# Patient Record
Sex: Male | Born: 1965
Health system: Southern US, Community
[De-identification: ages and names within clinical notes are randomized; demographics above are authoritative.]

## PROBLEM LIST (undated history)

## (undated) DIAGNOSIS — F1410 Cocaine abuse, uncomplicated: Secondary | ICD-10-CM

## (undated) DIAGNOSIS — L02619 Cutaneous abscess of unspecified foot: Secondary | ICD-10-CM

## (undated) DIAGNOSIS — E669 Obesity, unspecified: Secondary | ICD-10-CM

## (undated) DIAGNOSIS — N1831 Chronic kidney disease, stage 3a: Secondary | ICD-10-CM

## (undated) DIAGNOSIS — E119 Type 2 diabetes mellitus without complications: Secondary | ICD-10-CM

## (undated) DIAGNOSIS — M869 Osteomyelitis, unspecified: Secondary | ICD-10-CM

## (undated) DIAGNOSIS — S98111A Complete traumatic amputation of right great toe, initial encounter: Secondary | ICD-10-CM

## (undated) DIAGNOSIS — I10 Essential (primary) hypertension: Secondary | ICD-10-CM

## (undated) DIAGNOSIS — E785 Hyperlipidemia, unspecified: Secondary | ICD-10-CM

## (undated) DIAGNOSIS — A159 Respiratory tuberculosis unspecified: Secondary | ICD-10-CM

## (undated) DIAGNOSIS — B9562 Methicillin resistant Staphylococcus aureus infection as the cause of diseases classified elsewhere: Secondary | ICD-10-CM

## (undated) DIAGNOSIS — R7881 Bacteremia: Secondary | ICD-10-CM

## (undated) DIAGNOSIS — D649 Anemia, unspecified: Secondary | ICD-10-CM

## (undated) DIAGNOSIS — Z720 Tobacco use: Secondary | ICD-10-CM

## (undated) HISTORY — PX: FOOT SURGERY: SHX648

## (undated) HISTORY — DX: Respiratory tuberculosis unspecified: A15.9

## (undated) HISTORY — PX: TONSILLECTOMY: SUR1361

---

## 2003-07-20 ENCOUNTER — Emergency Department (HOSPITAL_COMMUNITY): Admission: EM | Admit: 2003-07-20 | Discharge: 2003-07-20 | Payer: Self-pay | Admitting: Emergency Medicine

## 2008-06-24 ENCOUNTER — Emergency Department (HOSPITAL_COMMUNITY): Admission: EM | Admit: 2008-06-24 | Discharge: 2008-06-24 | Payer: Self-pay | Admitting: Emergency Medicine

## 2008-07-29 ENCOUNTER — Emergency Department (HOSPITAL_COMMUNITY): Admission: EM | Admit: 2008-07-29 | Discharge: 2008-07-29 | Payer: Self-pay | Admitting: Family Medicine

## 2008-08-11 ENCOUNTER — Ambulatory Visit: Payer: Self-pay | Admitting: *Deleted

## 2009-08-31 ENCOUNTER — Emergency Department: Payer: Self-pay | Admitting: Emergency Medicine

## 2010-11-10 ENCOUNTER — Emergency Department (HOSPITAL_COMMUNITY)
Admission: EM | Admit: 2010-11-10 | Discharge: 2010-11-10 | Payer: Self-pay | Source: Home / Self Care | Admitting: Emergency Medicine

## 2011-02-14 LAB — COMPREHENSIVE METABOLIC PANEL
CO2: 24 mEq/L (ref 19–32)
Calcium: 8.9 mg/dL (ref 8.4–10.5)
Chloride: 96 mEq/L (ref 96–112)
Creatinine, Ser: 1.06 mg/dL (ref 0.4–1.5)
GFR calc non Af Amer: >60 mL/min (ref >60)
Glucose, Bld: 400 mg/dL — ABNORMAL HIGH (ref 70–99)
Total Bilirubin: 1.2 mg/dL (ref 0.3–1.2)

## 2011-02-14 LAB — URINALYSIS, ROUTINE W REFLEX MICROSCOPIC
Glucose, UA: >1000 mg/dL — AB
Ketones, ur: >80 mg/dL — AB
Leukocytes, UA: NEGATIVE
Nitrite: NEGATIVE
Protein, ur: NEGATIVE mg/dL
pH: 5 (ref 5.0–8.0)

## 2011-02-14 LAB — CBC
HCT: 42.3 % (ref 39.0–52.0)
Hemoglobin: 13.8 g/dL (ref 13.0–17.0)
MCH: 26.7 pg (ref 26.0–34.0)
MCHC: 32.6 g/dL (ref 30.0–36.0)
MCV: 81.8 fL (ref 78.0–100.0)

## 2011-02-14 LAB — URINE MICROSCOPIC-ADD ON

## 2011-02-14 LAB — DIFFERENTIAL
Basophils Absolute: 0 10*3/uL (ref 0.0–0.1)
Eosinophils Absolute: 0.1 10*3/uL (ref 0.0–0.7)
Lymphocytes Relative: 24 % (ref 12–46)
Lymphs Abs: 2.3 10*3/uL (ref 0.7–4.0)
Neutrophils Relative %: 69 % (ref 43–77)

## 2011-09-01 LAB — URINALYSIS, ROUTINE W REFLEX MICROSCOPIC
Bilirubin Urine: NEGATIVE
Glucose, UA: >1000 — AB
Hgb urine dipstick: NEGATIVE
Ketones, ur: 15 — AB
Leukocytes, UA: NEGATIVE
Nitrite: NEGATIVE
Protein, ur: NEGATIVE
Specific Gravity, Urine: >1.046 — ABNORMAL HIGH
Urobilinogen, UA: 0.2
pH: 5

## 2011-09-01 LAB — HEPATIC FUNCTION PANEL
Albumin: 3.8
Alkaline Phosphatase: 62
Indirect Bilirubin: 0.5
Total Protein: 6.9

## 2011-09-01 LAB — CBC
HCT: 46
Hemoglobin: 14.9
MCHC: 32.5
MCV: 83.1
Platelets: 277
RBC: 5.53
RDW: 13.4
WBC: 8

## 2011-09-01 LAB — DIFFERENTIAL
Basophils Absolute: 0
Basophils Relative: 0
Eosinophils Absolute: 0.1
Eosinophils Relative: 1
Lymphocytes Relative: 22
Lymphs Abs: 1.8
Monocytes Absolute: 0.4
Monocytes Relative: 5
Neutro Abs: 5.8
Neutrophils Relative %: 72

## 2011-09-01 LAB — POCT I-STAT, CHEM 8
BUN: 12
Calcium, Ion: 1.03 — ABNORMAL LOW
Chloride: 104
HCT: 50
Potassium: 4.4
Sodium: 135

## 2011-09-01 LAB — URINE MICROSCOPIC-ADD ON

## 2011-09-01 LAB — LIPASE, BLOOD: Lipase: 25

## 2011-09-01 LAB — KETONES, QUALITATIVE: Acetone, Bld: NEGATIVE

## 2014-06-01 ENCOUNTER — Emergency Department (HOSPITAL_COMMUNITY)
Admission: EM | Admit: 2014-06-01 | Discharge: 2014-06-01 | Disposition: A | Discharge status: Home / Self Care | Payer: No Typology Code available for payment source | Attending: Emergency Medicine | Admitting: Emergency Medicine

## 2014-06-01 ENCOUNTER — Encounter (HOSPITAL_COMMUNITY): Payer: Self-pay | Admitting: Emergency Medicine

## 2014-06-01 DIAGNOSIS — Z792 Long term (current) use of antibiotics: Secondary | ICD-10-CM | POA: Insufficient documentation

## 2014-06-01 DIAGNOSIS — Y9389 Activity, other specified: Secondary | ICD-10-CM | POA: Insufficient documentation

## 2014-06-01 DIAGNOSIS — F17200 Nicotine dependence, unspecified, uncomplicated: Secondary | ICD-10-CM | POA: Insufficient documentation

## 2014-06-01 DIAGNOSIS — L03012 Cellulitis of left finger: Secondary | ICD-10-CM

## 2014-06-01 DIAGNOSIS — Y9289 Other specified places as the place of occurrence of the external cause: Secondary | ICD-10-CM | POA: Insufficient documentation

## 2014-06-01 DIAGNOSIS — W298XXA Contact with other powered powered hand tools and household machinery, initial encounter: Secondary | ICD-10-CM | POA: Insufficient documentation

## 2014-06-01 DIAGNOSIS — S61211A Laceration without foreign body of left index finger without damage to nail, initial encounter: Secondary | ICD-10-CM

## 2014-06-01 DIAGNOSIS — S61209A Unspecified open wound of unspecified finger without damage to nail, initial encounter: Secondary | ICD-10-CM | POA: Insufficient documentation

## 2014-06-01 DIAGNOSIS — IMO0002 Reserved for concepts with insufficient information to code with codable children: Secondary | ICD-10-CM | POA: Insufficient documentation

## 2014-06-01 DIAGNOSIS — Y990 Civilian activity done for income or pay: Secondary | ICD-10-CM | POA: Insufficient documentation

## 2014-06-01 MED ORDER — CEPHALEXIN 500 MG PO CAPS: 500.0000 mg | ORAL_CAPSULE | Freq: Four times a day (QID) | ORAL | Status: DC

## 2014-06-01 MED ORDER — TETANUS-DIPHTH-ACELL PERTUSSIS 5-2.5-18.5 LF-MCG/0.5 IM SUSP
0.5000 mL | Freq: Once | INTRAMUSCULAR | Status: AC
  Administered 2014-06-01: 0.5 mL via INTRAMUSCULAR
  Filled 2014-06-01: qty 0.5

## 2014-06-01 MED ORDER — OXYCODONE-ACETAMINOPHEN 5-325 MG PO TABS
1.0000 | ORAL_TABLET | Freq: Once | ORAL | Status: AC
Start: 2014-06-01 — End: 2014-06-01
  Administered 2014-06-01: 1 via ORAL
  Filled 2014-06-01: qty 1

## 2014-06-01 MED ORDER — HYDROCODONE-ACETAMINOPHEN 5-325 MG PO TABS: 1.0000 | ORAL_TABLET | ORAL | Status: DC | PRN

## 2014-06-01 NOTE — ED Provider Notes (Signed)
Medical screening examination/treatment/procedure(s) were performed by non-physician practitioner and as supervising physician I was immediately available for consultation/collaboration.   EKG Interpretation None        Kristen N Ward, DO 06/01/14 2319 

## 2014-06-01 NOTE — ED Notes (Signed)
Hannah, PA at the bedside.  

## 2014-06-01 NOTE — ED Notes (Signed)
Reported pain 8/10 to [place], [place]. She gives verbal order for Percocet.

## 2014-06-01 NOTE — ED Provider Notes (Signed)
CSN: [id number]     Arrival date & time 06/01/14  1608 History  This chart was scribed for non-physician practitioner Dalia HeadingHannah Muthersbough, PA-C working with Layla MawKristen N Ward, DO by Joaquin MusicKristina Sanchez-Matthews, ED Scribe. This patient was seen in room TR09C/TR09C and the patient's care was started at 7:29 PM .   Chief Complaint  Patient presents with  . Laceration   The history is provided by the patient. No language interpreter was used.   HPI Comments: [name] is a 48 y.o. male who presents to the Emergency Department complaining of laceration to L pointer finger that occurred this afternoon while at work. 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 States he cleaned and wrapped the area but reports having wound open up while at work. Last Tetanus: 5+ years ago. Denies having medical problems and taking medications on a regular basis.  History reviewed. No pertinent past medical history. History reviewed. No pertinent past surgical history. History reviewed. No pertinent family history. History  Substance Use Topics  . Smoking status: Current Every Day Smoker  . Smokeless tobacco: Not on file  . Alcohol Use: Not on file    Review of Systems  Constitutional: Negative for fever.  Gastrointestinal: Negative for nausea and vomiting.  Skin: Positive for wound.  Allergic/Immunologic: Negative for immunocompromised state.  Neurological: Negative for weakness and numbness.  Hematological: Does not bruise/bleed easily.  Psychiatric/Behavioral: The patient is not nervous/anxious.     Allergies  Review of patient's allergies indicates no known allergies.  Home Medications   Prior to Admission medications   Medication Sig Start Date End Date Taking? Authorizing Provider  cephALEXin (KEFLEX) 500 MG capsule Take 1 capsule (500 mg total) by mouth 4 (four) times daily. 06/01/14   Hannah Muthersbaugh, PA-C  HYDROcodone-acetaminophen (NORCO/VICODIN)  5-325 MG per tablet Take 1 tablet by mouth every 4 (four) hours as needed for moderate pain or severe pain. 06/01/14   Hannah Muthersbaugh, PA-C   BP 126/80  Pulse 70  Temp(Src) 98.7 F (37.1 C) (Oral)  Resp 18  SpO2 94%  Physical Exam  Nursing note and vitals reviewed. Constitutional: He is oriented to person, place, and time. He appears well-developed and well-nourished. No distress.  HENT:  Head: Normocephalic and atraumatic.  Eyes: Conjunctivae are normal. No scleral icterus.  Neck: Normal range of motion.  Cardiovascular: Normal rate, regular rhythm, normal heart sounds and intact distal pulses.   No murmur heard. Capillary refill < 3 sec  Pulmonary/Chest: Effort normal and breath sounds normal. No respiratory distress.  Musculoskeletal: Normal range of motion. He exhibits no edema.  3 cm laceration to the dorsum of L pointer finger. Has a paronychia to the lateral side of pointer finger.  Neurological: He is alert and oriented to person, place, and time.  Sensation: intact to dull and sharp Strength: 5/5 including resisted flexion and extension    Skin: Skin is warm and dry. He is not diaphoretic.  Psychiatric: He has a normal mood and affect.   ED Course  Procedures DIAGNOSTIC STUDIES: Oxygen Saturation is 95% on RA, normal by my interpretation.    COORDINATION OF CARE: 7:33 PM-Discussed treatment plan which includes laceration repair and I&D. Pt agreed to plan.   7:34 PM- LACERATION REPAIR Performed by: Dalia HeadingHannah Muthersbough, PA-C Consent: Verbal consent obtained. Risks and benefits: risks, benefits and alternatives were discussed Patient identity confirmed: provided demographic data Time out performed prior to procedure Prepped and Draped  in normal sterile fashion Wound explored Laceration Location: Dorsum of L pointer finger Laceration Length: 3 cm No Foreign Bodies seen or palpated Anesthesia: local infiltration Local anesthetic:Digital block (See  note) Anesthetic total:  Irrigation method: syringe Amount of cleaning: standard Skin closure: 4-0 Prolene Number of sutures: 3 Technique: Simple Interrupted  Patient tolerance: Patient tolerated the procedure well with no immediate complications.  7:34 PM- Digital Block Performed by: Dalia HeadingHannah Muthersbough, PA-C Consent: Verbal consent obtained. Patient understanding: patient states understanding of the procedure being performed Patient identity confirmed: verbally with patient Local anesthesia used: yes Local anesthetic: lidocaine 1% without epinephrine  Anesthetic total: 4 ml Patient sedated: no Patient tolerance: Patient tolerated the procedure well with no immediate complications.  7:42 PM- INCISION AND DRAINAGE Performed by: Virgil BenedictHannah Mutherbough, PA-C Consent: Verbal consent obtained. Risks and benefits: risks, benefits and alternatives were discussed  Sterile Prep and Drape  Type: abscess  Body area: Lateral area of L pointer finger  Incision: 11 Blade  Complexity: complex Blunt dissection to breakup loculations  Drainage amount: moderate   Flushed with copious amount of sterile saline  Patient tolerance: Patient tolerated the procedure well with no immediate complications.  7:43 PM-Will discharge pt with a splint and informed pt to have sutures removed in 7 days by PCP or return to the ED. Advised pt to keep area clean and dry. Encouraged pt to return to the ED if he develops fever. Will discharge pt with abx. Will update Tetanus during today's visit. Pt agreed to plan.  Labs Review Labs Reviewed - No data to display  Imaging Review No results found.   EKG Interpretation None     MDM   Final diagnoses:  Laceration of left index finger w/o foreign body w/o damage to nail, initial encounter  Paronychia, left    [name] presents with laceration to the left pointer finger.  Tdap booster given.  Pressure irrigation performed. Laceration occurred < 8 hours  prior to repair which was well tolerated. Pt has no co morbidities to effect normal wound healing. Discussed suture home care w pt and answered questions. Pt to f-u for wound check and suture removal in 7 days. Pt also with paronychia and successful I&D.  Pt counseled to use warm water soaks for several days.  Pt is hemodynamically stable w no complaints prior to dc.    I have personally reviewed patient's vitals, nursing note and any pertinent labs or imaging. At this time, it has been determined that no acute conditions requiring further emergency intervention. The patient/guardian have been advised of the diagnosis and plan. I reviewed all labs and imaging including any potential incidental findings. We have discussed signs and symptoms that warrant return to the ED, such as fever, chills, N/V.  Patient/guardian has voiced understanding and agreed to follow-up with the PCP or specialist in 7 days for wound check.  Vital signs are stable at discharge.   BP 126/80  Pulse 70  Temp(Src) 98.7 F (37.1 C) (Oral)  Resp 18  SpO2 94%  I personally performed the services described in this documentation, which was scribed in my presence. The recorded information has been reviewed and is accurate.    Dahlia ClientHannah Muthersbaugh, PA-C 06/01/14 1956

## 2014-06-01 NOTE — ED Notes (Signed)
Pt in c/o laceration to left pointer finger that happened while at work, pt had wound closed with skin glue but it opened back up at work, dressing in place with bleeding controlled, unknown last tetanus

## 2014-06-01 NOTE — Discharge Instructions (Signed)
1. Medications: keflex, vicodin, usual home medications 2. Treatment: rest, drink plenty of fluids,  3. Follow Up: Please followup with your primary doctor, the ED or urgent care in 7 days for wound check and suture removal   Laceration Care, Adult A laceration is a cut or lesion that goes through all layers of the skin and into the tissue just beneath the skin. TREATMENT  Some lacerations may not require closure. Some lacerations may not be able to be closed due to an increased risk of infection. It is important to see your caregiver as soon as possible after an injury to minimize the risk of infection and maximize the opportunity for successful closure. If closure is appropriate, pain medicines may be given, if needed. The wound will be cleaned to help prevent infection. Your caregiver will use stitches (sutures), staples, wound glue (adhesive), or skin adhesive strips to repair the laceration. These tools bring the skin edges together to allow for faster healing and a better cosmetic outcome. However, all wounds will heal with a scar. Once the wound has healed, scarring can be minimized by covering the wound with sunscreen during the day for 1 full year. HOME CARE INSTRUCTIONS  For sutures or staples:  Keep the wound clean and dry.  If you were given a bandage (dressing), you should change it at least once a day. Also, change the dressing if it becomes wet or dirty, or as directed by your caregiver.  Wash the wound with soap and water 2 times a day. Rinse the wound off with water to remove all soap. Pat the wound dry with a clean towel.  After cleaning, apply a thin layer of the antibiotic ointment as recommended by your caregiver. This will help prevent infection and keep the dressing from sticking.  You may shower as usual after the first 24 hours. Do not soak the wound in water until the sutures are removed.  Only take over-the-counter or prescription medicines for pain, discomfort, or  fever as directed by your caregiver.  Get your sutures or staples removed as directed by your caregiver. For skin adhesive strips:  Keep the wound clean and dry.  Do not get the skin adhesive strips wet. You may bathe carefully, using caution to keep the wound dry.  If the wound gets wet, pat it dry with a clean towel.  Skin adhesive strips will fall off on their own. You may trim the strips as the wound heals. Do not remove skin adhesive strips that are still stuck to the wound. They will fall off in time. For wound adhesive:  You may briefly wet your wound in the shower or bath. Do not soak or scrub the wound. Do not swim. Avoid periods of heavy perspiration until the skin adhesive has fallen off on its own. After showering or bathing, gently pat the wound dry with a clean towel.  Do not apply liquid medicine, cream medicine, or ointment medicine to your wound while the skin adhesive is in place. This may loosen the film before your wound is healed.  If a dressing is placed over the wound, be careful not to apply tape directly over the skin adhesive. This may cause the adhesive to be pulled off before the wound is healed.  Avoid prolonged exposure to sunlight or tanning lamps while the skin adhesive is in place. Exposure to ultraviolet light in the first year will darken the scar.  The skin adhesive will usually remain in place for 5 to  10 days, then naturally fall off the skin. Do not pick at the adhesive film. You may need a tetanus shot if:  You cannot remember when you had your last tetanus shot.  You have never had a tetanus shot. If you get a tetanus shot, your arm may swell, get red, and feel warm to the touch. This is common and not a problem. If you need a tetanus shot and you choose not to have one, there is a rare chance of getting tetanus. Sickness from tetanus can be serious. SEEK MEDICAL CARE IF:   You have redness, swelling, or increasing pain in the wound.  You see  a red line that goes away from the wound.  You have yellowish-white fluid (pus) coming from the wound.  You have a fever.  You notice a bad smell coming from the wound or dressing.  Your wound breaks open before or after sutures have been removed.  You notice something coming out of the wound such as wood or glass.  Your wound is on your hand or foot and you cannot move a finger or toe. SEEK IMMEDIATE MEDICAL CARE IF:   Your pain is not controlled with prescribed medicine.  You have severe swelling around the wound causing pain and numbness or a change in color in your arm, hand, leg, or foot.  Your wound splits open and starts bleeding.  You have worsening numbness, weakness, or loss of function of any joint around or beyond the wound.  You develop painful lumps near the wound or on the skin anywhere on your body. MAKE SURE YOU:   Understand these instructions.  Will watch your condition.  Will get help right away if you are not doing well or get worse. Document Released: 11/20/2005 Document Revised: 02/12/2012 Document Reviewed: 05/16/2011 [facility] Patient Information 2015 [place], [place]. This information is not intended to replace advice given to you by your health care provider. Make sure you discuss any questions you have with your health care provider.   Paronychia Paronychia is an inflammatory reaction involving the folds of the skin surrounding the fingernail. This is commonly caused by an infection in the skin around a nail. The most common cause of paronychia is frequent wetting of the hands (as seen with bartenders, food servers, nurses or others who wet their hands). This makes the skin around the fingernail susceptible to infection by bacteria (germs) or fungus. Other predisposing factors are:  Aggressive manicuring.  Nail biting.  Thumb sucking. The most common cause is a staphylococcal (a type of germ) infection, or a fungal (Candida) infection. When caused  by a germ, it usually comes on suddenly with redness, swelling, pus and is often painful. It may get under the nail and form an abscess (collection of pus), or form an abscess around the nail. If the nail itself is infected with a fungus, the treatment is usually prolonged and may require oral medicine for up to one year. Your caregiver will determine the length of time treatment is required. The paronychia caused by bacteria (germs) may largely be avoided by not pulling on hangnails or picking at cuticles. When the infection occurs at the tips of the finger it is called felon. When the cause of paronychia is from the herpes simplex virus (HSV) it is called herpetic whitlow. TREATMENT  When an abscess is present treatment is often incision and drainage. This means that the abscess must be cut open so the pus can get out. When this is  done, the following home care instructions should be followed. HOME CARE INSTRUCTIONS   It is important to keep the affected fingers very dry. Rubber or plastic gloves over cotton gloves should be used whenever the hand must be placed in water.  Keep wound clean, dry and dressed as suggested by your caregiver between warm soaks or warm compresses.  Soak in warm water for fifteen to twenty minutes three to four times per day for bacterial infections. Fungal infections are very difficult to treat, so often require treatment for long periods of time.  For bacterial (germ) infections take antibiotics (medicine which kill germs) as directed and finish the prescription, even if the problem appears to be solved before the medicine is gone.  Only take over-the-counter or prescription medicines for pain, discomfort, or fever as directed by your caregiver. SEEK IMMEDIATE MEDICAL CARE IF:  You have redness, swelling, or increasing pain in the wound.  You notice pus coming from the wound.  You have a fever.  You notice a bad smell coming from the wound or dressing. Document  Released: 05/16/2001 Document Revised: 02/12/2012 Document Reviewed: 01/15/2009 [facility] Patient Information 2015 [place], [place]. This information is not intended to replace advice given to you by your health care provider. Make sure you discuss any questions you have with your health care provider.    Emergency Department Resource Guide 1) Find a Doctor and Pay Out of Pocket Although you won't have to find out who is covered by your insurance plan, it is a good idea to ask around and get recommendations. You will then need to call the office and see if the doctor you have chosen will accept you as a new patient and what types of options they offer for patients who are self-pay. Some doctors offer discounts or will set up payment plans for their patients who do not have insurance, but you will need to ask so you aren't surprised when you get to your appointment.  2) Contact Your Local Health Department Not all health departments have doctors that can see patients for sick visits, but many do, so it is worth a call to see if yours does. If you don't know where your local health department is, you can check in your phone book. The CDC also has a tool to help you locate your state's health department, and many state websites also have listings of all of their local health departments.  3) Find a Walk-in Clinic If your illness is not likely to be very severe or complicated, you may want to try a walk in clinic. These are popping up all over the country in pharmacies, drugstores, and shopping centers. They're usually staffed by nurse practitioners or physician assistants that have been trained to treat common illnesses and complaints. They're usually fairly quick and inexpensive. However, if you have serious medical issues or chronic medical problems, these are probably not your best option.  No Primary Care Doctor: - Call Health Connect at  [phone] - they can help you locate a primary care doctor that   accepts your insurance, provides certain services, etc. - Physician Referral Service- [phone]  Chronic Pain Problems: Organization         Address  Phone   Notes  [facility]  [phone] Patients need to be referred by their primary care doctor.   Medication Assistance: Organization         Address  Phone   Notes  [facility] Medication Assistance  [facility] [address] [place], [place] [address] [phone] --Must be a resident of [facility] -- Must have NO insurance coverage whatsoever (no Medicaid/ Medicare, etc.) -- The pt. MUST have a primary care doctor that directs their care regularly and follows them in the community   [facility]  [phone]   [employer]  [phone]    Agencies that provide inexpensive medical care: Organization         Address  Phone   Notes  [facility]  [phone]   [facility]    [phone]   [facility] [address] [place], [place] [address] [phone]   [facility] [address] [place] [place] [phone]   Planned Parenthood    [phone]   [facility]    [phone]   [facility]  [address], [place] Phone:  [phone], Fax:  [phone] Hours of Operation:  9 am - 6 pm, M-F.  Also accepts Medicaid/Medicare and self-pay.  [facility]  [address], [place] Phone: [phone], Fax: [phone]. Hours of Operation:  8:30 am - 5:30 pm, M-F.  Also accepts Medicaid and self-pay.  [facility] [place] [address] [place] Phone: [phone]   [facility] [address] Natasha Bence [place], [place] [phone], Ext. [phone] Mondays & Thursdays: 7-9 AM.  First 15 patients are seen on a first come, first serve basis.    Medicaid-accepting [facility] Providers:  Organization          Address  Phone   Notes  [facility] [address], [place] [phone] Also accepts self-pay patients.  [facility] [address] Laurell Josephs [place], [place]  [phone]   [facility] [address], [place] [phone]   [facility] [address], [place] [phone]   Renaye Rakers [address], [place]   [phone] Only accepts [place] patients after they have their name applied to their card.   Self-Pay (no insurance) in [facility]:  Organization         Address  Phone   Notes  Sickle Cell Patients, [facility] [address] [place], [place] [phone]   [facility] [address] [place], [place] [phone]   [facility] [place]  [address], [place] [phone]   [facility]/Dr. Osei-Bonsu  [address] [place] or [address], [place] [phone] Phone number for both [place] and [place] locations is the same.  [facility] and [facility] [address], [place] [phone]   [facility] [place] [address], [place] or [address] [phone]   [facility] [address] [place] [phone], phone; [phone], fax Sees patients 1st and 3rd Saturday of every month.  Must not qualify for public or private insurance (i.e. Medicaid, Medicare, [place] Health Choice, Veterans' Benefits)  Household income should be no more than 200% of the poverty level The clinic cannot treat you if you are pregnant or think you are pregnant  Sexually transmitted diseases are not treated at the clinic.  Dental Care: Organization         Address  Phone  Notes  [facility] [address] [place],  [place] [phone] Accepts children up to age 78 who are enrolled in [place] or [place] Health Choice; pregnant women with a Medicaid card; and children who have applied for Medicaid or [place] Health Choice, but were declined, whose parents can pay a reduced fee at time of service.  [facility]  [address], [place] [phone] Accepts children up to age 63 who are enrolled in [place] or [place] Health Choice; pregnant women with a Medicaid card; and children who have applied for Medicaid or [place] Health Choice, but were declined, whose parents can pay a reduced fee at time of service.  [facility]  [address] [place], [place] [phone] Patients are seen by appointment only. Walk-ins are not accepted. [facility] will see patients 59 years of age and older. Monday - Tuesday (8am-5pm) Most Wednesdays (8:30-5pm) $30 per visit, cash only  [facility]  [address], [facility] [phone] Patients are seen by appointment only. Walk-ins are not accepted. [facility] will see patients 6 years of age and older. One Wednesday Evening (Monthly: Volunteer Based).  $30 per visit, cash only  [employer]  [phone] for adults; Children under age 7, call Graduate Pediatric Dentistry at [phone]. Children aged 69-14, please call [phone] to request a pediatric application.  Dental services are provided in all areas of dental care including fillings, crowns and bridges, complete and partial dentures, implants, gum treatment, root canals, and extractions. Preventive care is also provided. Treatment is provided to both adults and children. Patients are selected via a lottery and there is often a waiting list.   [facility] [address], [place]  [phone] [email or web address]   [facility] [address] [place], [place]  [phone], Ext. [phone] Second and Fourth Thursday of each month, opens at 6:30 AM; Clinic ends at 9 AM.  Patients are seen on a first-come first-served basis, and a limited number are seen during each clinic.   [facility]  [address] Ether Griffins [place], [place] [phone]   Eligibility Requirements You must have lived in [place], [place], or [place] counties for at least the last three months.   You cannot be eligible for state or federal sponsored [place], including [employer], [place], or [employer].   You generally cannot be eligible for healthcare insurance through your employer.    How to apply: Eligibility screenings are held every Tuesday and Wednesday afternoon from 1:00 pm until 4:00 pm. You do not need an appointment for the interview!  [facility] [address] [place], [place] [phone]   [facility] Health Department  [phone]   [facility] Health Department  [phone]   [facility] Health Department  [phone]    Behavioral Health Resources in the Community: Intensive Outpatient Programs Organization         Address  Phone  Notes  [facility] [address] [place], [place] [phone]   [facility] Outpatient [address], [place], [place] [phone]   [facility] [address] [place], [place]  [phone]   [facility] [address],  [place], [place] [phone] or [phone]   Substance Abuse Resources Organization         Address  Phone  Notes  [facility]  [phone]   [facility]  [phone]   [facility]  [phone]   Floydene Flock  [phone]   Residential & Outpatient Substance Abuse Program  [phone]   Psychological Services Organization         Address  Phone  Notes  [facility] Behavioral Health  [phone]   [facility] Services  [phone]    [facility] [address] [place] [phone] or [phone]    Mobile Crisis Teams Organization         Address  Phone  Notes  [facility], Mobile Crisis Care Unit  [phone]   [facility]  [address] [place], [place] [phone]   Doristine Locks [address] [place] [phone]    Self-Help/Support Groups Organization         Address  Phone             Notes  [facility] [place] - variety of support groups  336- I7437963 Call for more information  Narcotics Anonymous (NA), [facility] [address], [employer] [place]  2 meetings at this location   Statistician         Address  Phone  Notes  [facility] [address],    [place]  [phone]   [facility]  [address] [place] [address], [place], [place] [phone]   [facility] [address] [place], [place] [phone] Admissions: 8am-3pm M-F  [facility] [address],    [place], [place] [phone]   [facility] [address] [place], [place], [place] [phone]   [facility] [address],  [place], [place] [phone]   [facility] - Intensive Outpatient [address] Laurell Josephs [address], [place], [place] [phone]   [facility] ([facility] [address] [place].,  [place], [place] [phone] or [phone]   [facility] ([facility]) [address], [place], [place] [phone] Accepts Medicaid  [facility] [address]  [place] [phone] Substance Abuse/Addiction Treatment   [facility] Organization         Address  Phone  Notes  [facility]  3035211032   Angie Fava, PhD [address] Ervin Knack [place], [place]   [phone] or [phone]   [facility]   [address] [place], [place] [phone]   [facility] [address], [place], [place] [phone] Insurance/Medicaid/sponsorship through [facility] [address]                                    [place], [place] [phone] Therapy/tele-psych/case  [facility] [address], [place] [phone]    Dr. Lolly Mustache  [phone]   [facility]  [employer] [facility] Dept. 1) [address] [place] 2) [address], [place] 3)  [address], [place] [phone]  [phone]   [facility] Child Abuse Hotline (573) 613-3586 or [phone] (After Hours)

## 2017-09-23 ENCOUNTER — Encounter (HOSPITAL_COMMUNITY): Payer: Self-pay | Admitting: *Deleted

## 2017-09-23 ENCOUNTER — Emergency Department (HOSPITAL_COMMUNITY)
Admission: EM | Admit: 2017-09-23 | Discharge: 2017-09-23 | Disposition: A | Discharge status: Home / Self Care | Payer: No Typology Code available for payment source | Attending: Emergency Medicine | Admitting: Emergency Medicine

## 2017-09-23 DIAGNOSIS — Y999 Unspecified external cause status: Secondary | ICD-10-CM | POA: Insufficient documentation

## 2017-09-23 DIAGNOSIS — Y92410 Unspecified street and highway as the place of occurrence of the external cause: Secondary | ICD-10-CM | POA: Diagnosis not present

## 2017-09-23 DIAGNOSIS — F17210 Nicotine dependence, cigarettes, uncomplicated: Secondary | ICD-10-CM | POA: Diagnosis not present

## 2017-09-23 DIAGNOSIS — Y939 Activity, unspecified: Secondary | ICD-10-CM | POA: Diagnosis not present

## 2017-09-23 DIAGNOSIS — M25511 Pain in right shoulder: Secondary | ICD-10-CM | POA: Insufficient documentation

## 2017-09-23 DIAGNOSIS — S39012A Strain of muscle, fascia and tendon of lower back, initial encounter: Secondary | ICD-10-CM | POA: Insufficient documentation

## 2017-09-23 DIAGNOSIS — Z79899 Other long term (current) drug therapy: Secondary | ICD-10-CM | POA: Insufficient documentation

## 2017-09-23 DIAGNOSIS — E119 Type 2 diabetes mellitus without complications: Secondary | ICD-10-CM | POA: Diagnosis not present

## 2017-09-23 DIAGNOSIS — S3992XA Unspecified injury of lower back, initial encounter: Secondary | ICD-10-CM | POA: Diagnosis present

## 2017-09-23 HISTORY — DX: Type 2 diabetes mellitus without complications: E11.9

## 2017-09-23 MED ORDER — CYCLOBENZAPRINE HCL 10 MG PO TABS
5.0000 mg | ORAL_TABLET | Freq: Once | ORAL | Status: AC
  Administered 2017-09-23: 5 mg via ORAL
  Filled 2017-09-23: qty 1

## 2017-09-23 MED ORDER — NAPROXEN 500 MG PO TABS: 500.0000 mg | ORAL_TABLET | Freq: Two times a day (BID) | ORAL | 0 refills | Status: DC

## 2017-09-23 MED ORDER — CYCLOBENZAPRINE HCL 5 MG PO TABS: 5.0000 mg | ORAL_TABLET | Freq: Two times a day (BID) | ORAL | 0 refills | Status: DC | PRN

## 2017-09-23 MED ORDER — KETOROLAC TROMETHAMINE 30 MG/ML IJ SOLN
30.0000 mg | Freq: Once | INTRAMUSCULAR | Status: AC
  Administered 2017-09-23: 30 mg via INTRAMUSCULAR
  Filled 2017-09-23: qty 1
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
## 2017-09-23 NOTE — ED Notes (Signed)
ED Provider at bedside. 
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
## 2017-12-05 ENCOUNTER — Encounter (HOSPITAL_COMMUNITY): Payer: Self-pay | Admitting: Emergency Medicine

## 2017-12-05 ENCOUNTER — Emergency Department (HOSPITAL_COMMUNITY)
Admission: EM | Admit: 2017-12-05 | Discharge: 2017-12-05 | Disposition: A | Discharge status: Home / Self Care | Payer: Self-pay | Attending: Emergency Medicine | Admitting: Emergency Medicine

## 2017-12-05 ENCOUNTER — Other Ambulatory Visit: Payer: Self-pay

## 2017-12-05 DIAGNOSIS — Z79899 Other long term (current) drug therapy: Secondary | ICD-10-CM | POA: Insufficient documentation

## 2017-12-05 DIAGNOSIS — R631 Polydipsia: Secondary | ICD-10-CM | POA: Insufficient documentation

## 2017-12-05 DIAGNOSIS — R358 Other polyuria: Secondary | ICD-10-CM | POA: Insufficient documentation

## 2017-12-05 DIAGNOSIS — E860 Dehydration: Secondary | ICD-10-CM | POA: Insufficient documentation

## 2017-12-05 DIAGNOSIS — F17210 Nicotine dependence, cigarettes, uncomplicated: Secondary | ICD-10-CM | POA: Insufficient documentation

## 2017-12-05 DIAGNOSIS — R739 Hyperglycemia, unspecified: Secondary | ICD-10-CM | POA: Insufficient documentation

## 2017-12-05 DIAGNOSIS — Z794 Long term (current) use of insulin: Secondary | ICD-10-CM | POA: Insufficient documentation

## 2017-12-05 DIAGNOSIS — R109 Unspecified abdominal pain: Secondary | ICD-10-CM | POA: Insufficient documentation

## 2017-12-05 DIAGNOSIS — E119 Type 2 diabetes mellitus without complications: Secondary | ICD-10-CM | POA: Insufficient documentation

## 2017-12-05 DIAGNOSIS — R110 Nausea: Secondary | ICD-10-CM | POA: Insufficient documentation

## 2017-12-05 DIAGNOSIS — R5383 Other fatigue: Secondary | ICD-10-CM | POA: Insufficient documentation

## 2017-12-05 DIAGNOSIS — R5381 Other malaise: Secondary | ICD-10-CM | POA: Insufficient documentation

## 2017-12-05 LAB — CBC
HCT: 43.9 % (ref 39.0–52.0)
Hemoglobin: 14.1 g/dL (ref 13.0–17.0)
MCH: 25.5 pg — AB (ref 26.0–34.0)
MCHC: 32.1 g/dL (ref 30.0–36.0)
MCV: 79.5 fL (ref 78.0–100.0)
PLATELETS: 273 10*3/uL (ref 150–400)
RBC: 5.52 MIL/uL (ref 4.22–5.81)
RDW: 13.4 % (ref 11.5–15.5)
WBC: 13.7 10*3/uL — AB (ref 4.0–10.5)

## 2017-12-05 LAB — BASIC METABOLIC PANEL
Anion gap: 9 (ref 5–15)
BUN: 21 mg/dL — AB (ref 6–20)
CALCIUM: 8.8 mg/dL — AB (ref 8.9–10.3)
CO2: 21 mmol/L — AB (ref 22–32)
CREATININE: 1.32 mg/dL — AB (ref 0.61–1.24)
Chloride: 99 mmol/L — ABNORMAL LOW (ref 101–111)
GFR calc non Af Amer: >60 mL/min (ref >60)
Glucose, Bld: 460 mg/dL — ABNORMAL HIGH (ref 65–99)
Potassium: 4.6 mmol/L (ref 3.5–5.1)
SODIUM: 129 mmol/L — AB (ref 135–145)

## 2017-12-05 LAB — CBG MONITORING, ED
Glucose-Capillary: 466 mg/dL — ABNORMAL HIGH (ref 65–99)
Glucose-Capillary: 307 mg/dL — ABNORMAL HIGH (ref 65–99)

## 2017-12-05 MED ORDER — INSULIN ASPART PROT & ASPART (70-30 MIX) 100 UNIT/ML ~~LOC~~ SUSP: 10.0000 [IU] | Freq: Two times a day (BID) | SUBCUTANEOUS | 0 refills | Status: DC

## 2017-12-05 MED ORDER — METFORMIN HCL 500 MG PO TABS: 500.0000 mg | ORAL_TABLET | Freq: Two times a day (BID) | ORAL | 0 refills | Status: DC

## 2017-12-05 MED ORDER — SODIUM CHLORIDE 0.9 % IV BOLUS (SEPSIS)
2000.0000 mL | Freq: Once | INTRAVENOUS | Status: AC
  Administered 2017-12-05: 2000 mL via INTRAVENOUS

## 2017-12-05 MED FILL — metFORMIN HCL 500 MG TABS: 500 | 30 days supply | Qty: 60 | Fill #0

## 2017-12-05 NOTE — ED Triage Notes (Signed)
Out of sugar meds x 8-9 months , feels bad, has had diarrhea, has been thirsty and voiding a lot

## 2017-12-05 NOTE — ED Notes (Signed)
Pt's CBG was 466, RN notified

## 2017-12-05 NOTE — ED Provider Notes (Signed)
[facility]ERGENC[facility] Provider Note   CSN: [id number] Arrival date & time: 12/05/17  1053     History   Chief Complaint Chief Complaint  Patient presents with  . Hyperglycemia    HPI [name] is a 52 y.o. male.  The history is provided by the patient.  Hyperglycemia  Blood sugar level PTA:  >400 Severity:  Moderate Onset quality:  Gradual Duration:  1 week Timing:  Constant Progression:  Worsening Chronicity:  New Diabetes status:  Controlled with insulin and controlled with oral medications Current diabetic therapy:  Glucophage adn lantus Time since last antidiabetic medication:  1 week Context comment:  Lost his insurance 8 months ago and ran out of his medication 1 week ago and does not have a primary physician Relieved by:  Nothing Ineffective treatments:  None tried Associated symptoms: abdominal pain, dehydration, fatigue, increased thirst, malaise, nausea and polyuria   Associated symptoms: no fever, no shortness of breath, no vomiting and no weakness     Past Medical History:  Diagnosis Date  . Diabetes mellitus without complication (HCC)     There are no active problems to display for this patient.   Past Surgical History:  Procedure Laterality Date  . TONSILLECTOMY         Home Medications    Prior to Admission medications   Medication Sig Start Date End Date Taking? Authorizing Provider  cephALEXin (KEFLEX) 500 MG capsule Take 1 capsule (500 mg total) by mouth 4 (four) times daily. 06/01/14   Muthersbaugh, Dahlia ClientHannah, PA-C  cyclobenzaprine (FLEXERIL) 5 MG tablet Take 1 tablet (5 mg total) by mouth 2 (two) times daily as needed for muscle spasms. 09/23/17   Horton, Mayer Maskerourtney F, MD  HYDROcodone-acetaminophen (NORCO/VICODIN) 5-325 MG per tablet Take 1 tablet by mouth every 4 (four) hours as needed for moderate pain or severe pain. 06/01/14   Muthersbaugh, Dahlia ClientHannah, PA-C  naproxen (NAPROSYN) 500 MG tablet Take 1 tablet (500 mg  total) by mouth 2 (two) times daily. 09/23/17   Horton, Mayer Maskerourtney F, MD    Family History No family history on file.  Social History Social History   Tobacco Use  . Smoking status: Current Every Day Smoker    Packs/day: 1.00  . Smokeless tobacco: Never Used  Substance Use Topics  . Alcohol use: No  . Drug use: No     Allergies   Patient has no known allergies.   Review of Systems Review of Systems  Constitutional: Positive for fatigue. Negative for fever.  Respiratory: Negative for shortness of breath.   Gastrointestinal: Positive for abdominal pain and nausea. Negative for vomiting.  Endocrine: Positive for polydipsia and polyuria.  Neurological: Negative for weakness.  All other systems reviewed and are negative.    Physical Exam Updated Vital Signs BP 114/82 (BP Location: Right Arm)   Pulse 97   Temp 98.3 F (36.8 C) (Oral)   Resp 20   SpO2 99%   Physical Exam  Constitutional: He is oriented to person, place, and time. He appears well-developed and well-nourished. No distress.  HENT:  Head: Normocephalic and atraumatic.  Mouth/Throat: Oropharynx is clear and moist. Mucous membranes are dry.  Eyes: Conjunctivae and EOM are normal. Pupils are equal, round, and reactive to light.  Neck: Normal range of motion. Neck supple.  Cardiovascular: Normal rate, regular rhythm and intact distal pulses.  No murmur heard. Pulmonary/Chest: Effort normal and breath sounds normal. No respiratory distress. He has no wheezes. He has no rales.  Abdominal: Soft. He exhibits no distension. There is no tenderness. There is no rebound and no guarding.  Musculoskeletal: Normal range of motion. He exhibits no edema or tenderness.  Neurological: He is alert and oriented to person, place, and time.  Skin: Skin is warm and dry. No rash noted. No erythema.  Psychiatric: He has a normal mood and affect. His behavior is normal.  Nursing note and vitals reviewed.    ED Treatments /  Results  Labs (all labs ordered are listed, but only abnormal results are displayed) Labs Reviewed  BASIC METABOLIC PANEL - Abnormal; Notable for the following components:      Result Value   Sodium 129 (*)    Chloride 99 (*)    CO2 21 (*)    Glucose, Bld 460 (*)    BUN 21 (*)    Creatinine, Ser 1.32 (*)    Calcium 8.8 (*)    All other components within normal limits  CBC - Abnormal; Notable for the following components:   WBC 13.7 (*)    MCH 25.5 (*)    All other components within normal limits  CBG MONITORING, ED - Abnormal; Notable for the following components:   Glucose-Capillary 466 (*)    All other components within normal limits  URINALYSIS, ROUTINE W REFLEX MICROSCOPIC    EKG  EKG Interpretation None       Radiology No results found.  Procedures Procedures (including critical care time)  Medications Ordered in ED Medications  sodium chloride 0.9 % bolus 2,000 mL (not administered)     Initial Impression / Assessment and Plan / ED Course  I have reviewed the triage vital signs and the nursing notes.  Pertinent labs & imaging results that were available during my care of the patient were reviewed by me and considered in my medical decision making (see chart for details).     Patient presenting today with hyperglycemia related to running out of his diabetic meds.  Patient does not appear to be in DKA today however is complaining of symptoms related to hyperglycemia.  Vital signs are within normal limits.  Patient will be given IV fluids and then will ensure that he is discharged home with prescriptions for his home medications and case management to try to find a PCP.  Final Clinical Impressions(s) / ED Diagnoses   Final diagnoses:  Hyperglycemia    ED Discharge Orders        Ordered    metFORMIN (GLUCOPHAGE) 500 MG tablet  2 times daily     12/05/17 1617    insulin aspart protamine- aspart (NOVOLOG MIX 70/30) (70-30) 100 UNIT/ML injection  2 times  daily     12/05/17 1617       Gwyneth Sprout, MD 12/05/17 1619

## 2017-12-18 ENCOUNTER — Ambulatory Visit (INDEPENDENT_AMBULATORY_CARE_PROVIDER_SITE_OTHER): Payer: Self-pay | Admitting: Physician Assistant

## 2019-08-14 ENCOUNTER — Other Ambulatory Visit: Payer: Self-pay

## 2019-08-14 ENCOUNTER — Encounter (HOSPITAL_COMMUNITY): Payer: Self-pay

## 2019-08-14 ENCOUNTER — Emergency Department (HOSPITAL_COMMUNITY)
Admission: EM | Admit: 2019-08-14 | Discharge: 2019-08-14 | Disposition: A | Discharge status: Home / Self Care | Payer: Self-pay | Attending: Emergency Medicine | Admitting: Emergency Medicine

## 2019-08-14 DIAGNOSIS — E119 Type 2 diabetes mellitus without complications: Secondary | ICD-10-CM | POA: Insufficient documentation

## 2019-08-14 DIAGNOSIS — F17210 Nicotine dependence, cigarettes, uncomplicated: Secondary | ICD-10-CM | POA: Insufficient documentation

## 2019-08-14 DIAGNOSIS — Z794 Long term (current) use of insulin: Secondary | ICD-10-CM | POA: Insufficient documentation

## 2019-08-14 DIAGNOSIS — J029 Acute pharyngitis, unspecified: Secondary | ICD-10-CM | POA: Insufficient documentation

## 2019-08-14 MED ORDER — CEFTRIAXONE SODIUM 250 MG IJ SOLR
250.0000 mg | Freq: Once | INTRAMUSCULAR | Status: AC
  Administered 2019-08-14: 11:00:00 250 mg via INTRAMUSCULAR
  Filled 2019-08-14: qty 250

## 2019-08-14 MED ORDER — AZITHROMYCIN 250 MG PO TABS
1000.0000 mg | ORAL_TABLET | Freq: Once | ORAL | Status: AC
  Administered 2019-08-14: 11:00:00 1000 mg via ORAL
  Filled 2019-08-14: qty 4

## 2019-08-14 NOTE — ED Provider Notes (Signed)
[facility]   CSN: [id number] Arrival date & time: 08/14/19  [id number]     History   Chief Complaint Chief Complaint  Patient presents with   Sore Throat    HPI [name] is a 53 y.o. male.     Patient with history of tonsillectomy presents the emergency department with complaint of sore throat over the past 2 to 3 days.  Patient reports performing oral sex on a male partner just prior to the sore throat beginning.  He did not have other intercourse.  States that the pain is worse with swallowing and at nighttime.  He has been using Chloraseptic with minimal improvement.  No fevers, nausea or vomiting.  No other sick contacts.  No vomiting or fevers.     Past Medical History:  Diagnosis Date   Diabetes mellitus without complication (HCC)     There are no active problems to display for this patient.   Past Surgical History:  Procedure Laterality Date   TONSILLECTOMY          Home Medications    Prior to Admission medications   Medication Sig Start Date End Date Taking? Authorizing Provider  cephALEXin (KEFLEX) 500 MG capsule Take 1 capsule (500 mg total) by mouth 4 (four) times daily. 06/01/14   Muthersbaugh, Dahlia ClientHannah, PA-C  cyclobenzaprine (FLEXERIL) 5 MG tablet Take 1 tablet (5 mg total) by mouth 2 (two) times daily as needed for muscle spasms. 09/23/17   Horton, Mayer Maskerourtney F, MD  HYDROcodone-acetaminophen (NORCO/VICODIN) 5-325 MG per tablet Take 1 tablet by mouth every 4 (four) hours as needed for moderate pain or severe pain. 06/01/14   Muthersbaugh, Dahlia ClientHannah, PA-C  insulin aspart protamine- aspart (NOVOLOG MIX 70/30) (70-30) 100 UNIT/ML injection Inject 0.1 mLs (10 Units total) into the skin 2 (two) times daily. Used 10 units in the morning and 10 units before bed. 12/05/17   Gwyneth SproutPlunkett, Whitney, MD  metFORMIN (GLUCOPHAGE) 500 MG tablet Take 1 tablet (500 mg total) by mouth 2 (two) times daily. 12/05/17   Gwyneth SproutPlunkett, Whitney,  MD  naproxen (NAPROSYN) 500 MG tablet Take 1 tablet (500 mg total) by mouth 2 (two) times daily. 09/23/17   Horton, Mayer Maskerourtney F, MD    Family History History reviewed. No pertinent family history.  Social History Social History   Tobacco Use   Smoking status: Current Every Day Smoker    Packs/day: 1.00   Smokeless tobacco: Never Used  Substance Use Topics   Alcohol use: No   Drug use: No     Allergies   Patient has no known allergies.   Review of Systems Review of Systems  Constitutional: Negative for fever.  HENT: Positive for sore throat.   Eyes: Negative for discharge.  Gastrointestinal: Negative for rectal pain.  Genitourinary: Negative for discharge, dysuria, frequency, genital sores, penile pain and testicular pain.  Musculoskeletal: Negative for arthralgias.  Skin: Negative for rash.  Hematological: Negative for adenopathy.     Physical Exam Updated Vital Signs BP (!) 179/114 (BP Location: Right Arm)    Pulse 84    Temp 98.6 F (37 C) (Oral)    Ht 5\' 9"  (1.753 m)    Wt 102.1 kg    SpO2 99%    BMI 33.23 kg/m   Physical Exam Vitals signs and nursing note reviewed.  Constitutional:      Appearance: He is well-developed.  HENT:     Head: Normocephalic and atraumatic.     Right Ear:  External ear normal.     Left Ear: External ear normal.     Nose: Nose normal.     Mouth/Throat:     Mouth: Mucous membranes are moist.     Pharynx: Posterior oropharyngeal erythema present. No oropharyngeal exudate or uvula swelling.     Comments: Generalized erythema the posterior pharynx without exudate or abscess.  No peritonsillar abscess.  There is a small lesion on the soft palate, nonvesicular, left side. Eyes:     Conjunctiva/sclera: Conjunctivae normal.  Neck:     Musculoskeletal: Normal range of motion and neck supple.  Pulmonary:     Effort: No respiratory distress.  Skin:    General: Skin is warm and dry.  Neurological:     Mental Status: He is alert.       ED Treatments / Results  Labs (all labs ordered are listed, but only abnormal results are displayed) Labs Reviewed  GC/CHLAMYDIA PROBE AMP ([place]) NOT AT [facility]    EKG None  Radiology No results found.  Procedures Procedures (including critical care time)  Medications Ordered in ED Medications  cefTRIAXone (ROCEPHIN) injection 250 mg (has no administration in time range)  azithromycin (ZITHROMAX) tablet 1,000 mg (has no administration in time range)     Initial Impression / Assessment and Plan / ED Course  I have reviewed the triage vital signs and the nursing notes.  Pertinent labs & imaging results that were available during my care of the patient were reviewed by me and considered in my medical decision making (see chart for details).        Patient seen and examined.  Will test and treat for GC/chlamydia.  Patient will continue conservative measures.  Vital signs reviewed and are as follows: BP (!) 179/114 (BP Location: Right Arm)    Pulse 84    Temp 98.6 F (37 C) (Oral)    Ht 5\' 9"  (1.753 m)    Wt 102.1 kg    SpO2 99%    BMI 33.23 kg/m    10:23 AM Patient counseled on safe sexual practices. Told them that they should not have sexual contact for next 7 days and that they need to inform sexual partners so that they can get tested and treated as well. Patient verbalizes understanding and agrees with plan.      Final Clinical Impressions(s) / ED Diagnoses   Final diagnoses:  Sore throat   Patient with sore throat, concern for STI.  Patient tested and treated.  No signs of abscess or a more significant complication which would require advanced imaging at this time.  Patient appears well, nontoxic.  Doubt strep throat.  ED Discharge Orders    None       Carlisle Cater, Hershal Coria 08/14/19 1024    Lucrezia Starch, MD 08/15/19 [phone]

## 2019-08-14 NOTE — Discharge Instructions (Signed)
Return with worsening pain, difficulty swallowing, difficulty breathing, fevers, trouble moving her neck or other concerns.
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
## 2019-08-15 LAB — GC/CHLAMYDIA PROBE AMP (~~LOC~~) NOT AT ARMC
Chlamydia: NEGATIVE
Neisseria Gonorrhea: NEGATIVE

## 2019-12-29 ENCOUNTER — Other Ambulatory Visit: Payer: Self-pay

## 2019-12-29 ENCOUNTER — Emergency Department (HOSPITAL_COMMUNITY): Payer: Self-pay

## 2019-12-29 ENCOUNTER — Encounter (HOSPITAL_COMMUNITY): Payer: Self-pay | Admitting: Emergency Medicine

## 2019-12-29 ENCOUNTER — Emergency Department (HOSPITAL_COMMUNITY)
Admission: EM | Admit: 2019-12-29 | Discharge: 2019-12-29 | Disposition: A | Discharge status: Home / Self Care | Payer: Self-pay | Attending: Emergency Medicine | Admitting: Emergency Medicine

## 2019-12-29 DIAGNOSIS — R0789 Other chest pain: Secondary | ICD-10-CM | POA: Insufficient documentation

## 2019-12-29 DIAGNOSIS — F17210 Nicotine dependence, cigarettes, uncomplicated: Secondary | ICD-10-CM | POA: Insufficient documentation

## 2019-12-29 DIAGNOSIS — R739 Hyperglycemia, unspecified: Secondary | ICD-10-CM

## 2019-12-29 DIAGNOSIS — R0602 Shortness of breath: Secondary | ICD-10-CM | POA: Insufficient documentation

## 2019-12-29 DIAGNOSIS — Z7984 Long term (current) use of oral hypoglycemic drugs: Secondary | ICD-10-CM | POA: Insufficient documentation

## 2019-12-29 DIAGNOSIS — R079 Chest pain, unspecified: Secondary | ICD-10-CM

## 2019-12-29 DIAGNOSIS — E1165 Type 2 diabetes mellitus with hyperglycemia: Secondary | ICD-10-CM | POA: Insufficient documentation

## 2019-12-29 LAB — COMPREHENSIVE METABOLIC PANEL
ALT: 18 U/L (ref 0–44)
AST: 18 U/L (ref 15–41)
Albumin: 3.3 g/dL — ABNORMAL LOW (ref 3.5–5.0)
Alkaline Phosphatase: 63 U/L (ref 38–126)
Anion gap: 9 (ref 5–15)
BUN: 16 mg/dL (ref 6–20)
CO2: 29 mmol/L (ref 22–32)
Calcium: 8.8 mg/dL — ABNORMAL LOW (ref 8.9–10.3)
Chloride: 98 mmol/L (ref 98–111)
Creatinine, Ser: 1.33 mg/dL — ABNORMAL HIGH (ref 0.61–1.24)
GFR calc Af Amer: >60 mL/min (ref >60)
GFR calc non Af Amer: >60 mL/min (ref >60)
Glucose, Bld: 398 mg/dL — ABNORMAL HIGH (ref 70–99)
Potassium: 3.9 mmol/L (ref 3.5–5.1)
Sodium: 136 mmol/L (ref 135–145)
Total Bilirubin: <0.1 mg/dL — ABNORMAL LOW (ref 0.3–1.2)
Total Protein: 6.5 g/dL (ref 6.5–8.1)

## 2019-12-29 LAB — CBC
HCT: 44.5 % (ref 39.0–52.0)
Hemoglobin: 13.9 g/dL (ref 13.0–17.0)
MCH: 26.2 pg (ref 26.0–34.0)
MCHC: 31.2 g/dL (ref 30.0–36.0)
MCV: 84 fL (ref 80.0–100.0)
Platelets: 231 10*3/uL (ref 150–400)
RBC: 5.3 MIL/uL (ref 4.22–5.81)
RDW: 13.2 % (ref 11.5–15.5)
WBC: 4.8 10*3/uL (ref 4.0–10.5)
nRBC: 0 % (ref 0.0–0.2)

## 2019-12-29 LAB — LIPASE, BLOOD: Lipase: 32 U/L (ref 11–51)

## 2019-12-29 LAB — TROPONIN I (HIGH SENSITIVITY)
Troponin I (High Sensitivity): 4 ng/L (ref <18)
Troponin I (High Sensitivity): 5 ng/L (ref <18)

## 2019-12-29 MED ORDER — ALUM & MAG HYDROXIDE-SIMETH 200-200-20 MG/5ML PO SUSP
30.0000 mL | Freq: Once | ORAL | Status: AC
  Administered 2019-12-29: 30 mL via ORAL
  Filled 2019-12-29: qty 30

## 2019-12-29 MED ORDER — FAMOTIDINE 20 MG PO TABS: 20.0000 mg | ORAL_TABLET | Freq: Two times a day (BID) | ORAL | 0 refills | Status: DC

## 2019-12-29 MED ORDER — METFORMIN HCL 500 MG PO TABS: 500.0000 mg | ORAL_TABLET | Freq: Two times a day (BID) | ORAL | 1 refills | Status: DC

## 2019-12-29 MED ORDER — SODIUM CHLORIDE 0.9% FLUSH: 3.0000 mL | Freq: Once | INTRAVENOUS | Status: DC

## 2019-12-29 MED ORDER — SODIUM CHLORIDE 0.9 % IV BOLUS
1000.0000 mL | Freq: Once | INTRAVENOUS | Status: AC
  Administered 2019-12-29: 10:00:00 1000 mL via INTRAVENOUS

## 2019-12-29 MED ORDER — PANTOPRAZOLE SODIUM 40 MG PO TBEC: 40.0000 mg | DELAYED_RELEASE_TABLET | Freq: Every day | ORAL | 1 refills | Status: DC

## 2019-12-29 MED ORDER — NITROGLYCERIN 0.4 MG SL SUBL: 0.4000 mg | SUBLINGUAL_TABLET | SUBLINGUAL | Status: DC | PRN

## 2019-12-29 MED ORDER — LIDOCAINE VISCOUS HCL 2 % MT SOLN
15.0000 mL | Freq: Once | OROMUCOSAL | Status: AC
  Administered 2019-12-29: 13:00:00 15 mL via ORAL
  Filled 2019-12-29: qty 15

## 2019-12-29 MED ORDER — ASPIRIN 81 MG PO CHEW
324.0000 mg | CHEWABLE_TABLET | Freq: Once | ORAL | Status: AC
  Administered 2019-12-29: 10:00:00 324 mg via ORAL
  Filled 2019-12-29: qty 4

## 2019-12-29 NOTE — ED Notes (Signed)
Patient verbalizes understanding of discharge instructions. Opportunity for questioning and answers were provided. Armband removed by staff, pt discharged from ED.  

## 2019-12-29 NOTE — ED Triage Notes (Addendum)
C/o intermittent pain to center of chest x 6-7 months that is more constant over the last month with SOB.  Pt off diabetes meds for the past several months due to insurance change at work.

## 2019-12-29 NOTE — ED Provider Notes (Signed)
[facility] EMERGENCY DEPARTMENT Provider Note   CSN: [id number] Arrival date & time: 12/29/19  2694     History Chief Complaint  Patient presents with  . Chest Pain    [name] is a 54 y.o. male.  HPI 54 year old male presents with chest pain.  Has been ongoing for about 5 or 6 months.  However over the last 30 days or so it seems to be more consistent.  Is happening basically every other day.  Often multiple times a day.  Food seems to often cause it as well as drinking.  The pain is at the inferior aspect of his sternum.  It feels sharp.  He feels pain immediately after eating.  He has also noticed feeling short of breath like at work.  Shortness of breath is often accompanied this pain as well.  Has tried multiple antacids without relief.  This most recent episode of pain started last night.  Currently is about a 5 out of 10.  No leg swelling, fever, cough.  Has a history of diabetes and smokes.  Denies other cardiac risk factors.  Has not had his diabetic meds in about a month due to insurance issues.   Past Medical History:  Diagnosis Date  . Diabetes mellitus without complication (HCC)     There are no problems to display for this patient.   Past Surgical History:  Procedure Laterality Date  . TONSILLECTOMY         No family history on file.  Social History   Tobacco Use  . Smoking status: Current Every Day Smoker    Packs/day: 1.00  . Smokeless tobacco: Never Used  Substance Use Topics  . Alcohol use: No  . Drug use: No    Home Medications Prior to Admission medications   Medication Sig Start Date End Date Taking? Authorizing Provider  cyclobenzaprine (FLEXERIL) 5 MG tablet Take 1 tablet (5 mg total) by mouth 2 (two) times daily as needed for muscle spasms. 09/23/17   Horton, Mayer Masker, MD  famotidine (PEPCID) 20 MG tablet Take 1 tablet (20 mg total) by mouth 2 (two) times daily. 12/29/19   Pricilla Loveless, MD  insulin aspart protamine-  aspart (NOVOLOG MIX 70/30) (70-30) 100 UNIT/ML injection Inject 0.1 mLs (10 Units total) into the skin 2 (two) times daily. Used 10 units in the morning and 10 units before bed. 12/05/17   Gwyneth Sprout, MD  metFORMIN (GLUCOPHAGE) 500 MG tablet Take 1 tablet (500 mg total) by mouth 2 (two) times daily. 12/29/19   Pricilla Loveless, MD  pantoprazole (PROTONIX) 40 MG tablet Take 1 tablet (40 mg total) by mouth daily. 12/29/19   Pricilla Loveless, MD    Allergies    Patient has no known allergies.  Review of Systems   Review of Systems  Constitutional: Negative for fever.  Respiratory: Positive for shortness of breath. Negative for cough.   Cardiovascular: Positive for chest pain.  Gastrointestinal: Negative for abdominal pain and nausea.  Musculoskeletal: Negative for back pain.  All other systems reviewed and are negative.   Physical Exam Updated Vital Signs BP 137/88 (BP Location: Left Arm)   Pulse 79   Temp 98.9 F (37.2 C) (Oral)   Resp 17   Ht 5\' 10"  (1.778 m)   Wt 102.1 kg   SpO2 98%   BMI 32.28 kg/m   Physical Exam Vitals and nursing note reviewed.  Constitutional:      General: He is not in acute distress.  Appearance: He is well-developed. He is not ill-appearing or diaphoretic.  HENT:     Head: Normocephalic and atraumatic.     Right Ear: External ear normal.     Left Ear: External ear normal.     Nose: Nose normal.  Eyes:     General:        Right eye: No discharge.        Left eye: No discharge.  Cardiovascular:     Rate and Rhythm: Normal rate and regular rhythm.     Heart sounds: Normal heart sounds.  Pulmonary:     Effort: Pulmonary effort is normal.     Breath sounds: Normal breath sounds.  Chest:     Chest wall: Tenderness present.    Abdominal:     Palpations: Abdomen is soft.     Tenderness: There is no abdominal tenderness.  Musculoskeletal:     Cervical back: Neck supple.  Skin:    General: Skin is warm and dry.  Neurological:     Mental  Status: He is alert.  Psychiatric:        Mood and Affect: Mood is not anxious.     ED Results / Procedures / Treatments   Labs (all labs ordered are listed, but only abnormal results are displayed) Labs Reviewed  COMPREHENSIVE METABOLIC PANEL - Abnormal; Notable for the following components:      Result Value   Glucose, Bld 398 (*)    Creatinine, Ser 1.33 (*)    Calcium 8.8 (*)    Albumin 3.3 (*)    Total Bilirubin <0.1 (*)    All other components within normal limits  CBC  LIPASE, BLOOD  TROPONIN I (HIGH SENSITIVITY)  TROPONIN I (HIGH SENSITIVITY)    EKG EKG Interpretation  Date/Time:  Monday December 29 2019 09:38:55 EST Ventricular Rate:  80 PR Interval:    QRS Duration: 99 QT Interval:  381 QTC Calculation: 440 R Axis:   -18 Text Interpretation: Sinus rhythm Borderline left axis deviation Low voltage, extremity leads Anteroseptal infarct, old Confirmed by Sherwood Gambler [phone]) on 12/29/2019 9:43:12 AM   Radiology DG Chest 2 View  Result Date: 12/29/2019 CLINICAL DATA:  54 year old male with shortness of breath and chest pain EXAM: CHEST - 2 VIEW COMPARISON:  None. FINDINGS: The lungs are clear and negative for focal airspace consolidation, pulmonary edema or suspicious pulmonary nodule. No pleural effusion or pneumothorax. Cardiac and mediastinal contours are within normal limits. No acute fracture or lytic or blastic osseous lesions. The visualized upper abdominal bowel gas pattern is unremarkable. IMPRESSION: No active cardiopulmonary disease. Electronically Signed   By: Jacqulynn Cadet M.D.   On: 12/29/2019 10:03    Procedures Procedures (including critical care time)  Medications Ordered in ED Medications  sodium chloride flush (NS) 0.9 % injection 3 mL (3 mLs Intravenous Not Given 12/29/19 1010)  nitroGLYCERIN (NITROSTAT) SL tablet 0.4 mg (has no administration in time range)  sodium chloride 0.9 % bolus 1,000 mL (0 mLs Intravenous Stopped 12/29/19 1213)    aspirin chewable tablet 324 mg (324 mg Oral Given 12/29/19 1009)  alum & mag hydroxide-simeth (MAALOX/MYLANTA) 200-200-20 MG/5ML suspension 30 mL (30 mLs Oral Given 12/29/19 1235)    And  lidocaine (XYLOCAINE) 2 % viscous mouth solution 15 mL (15 mLs Oral Given 12/29/19 1235)    ED Course  I have reviewed the triage vital signs and the nursing notes.  Pertinent labs & imaging results that were available during my care of the  patient were reviewed by me and considered in my medical decision making (see chart for details).    MDM Rules/Calculators/A&P                      Patient's chest pain is atypical.  Troponins are negative x2.  ECG without acute ischemia.  Sounds more like GI with this association with food.  He does feel somewhat better with GI cocktail.  Abdominal exam is benign.  I will prescribe PPI/H2 blocker.  He also was noted to be hyperglycemic but without acidosis.  Not consistent with DKA but rather with poor compliance.  He would like a another prescription for Metformin and I recommended he follow-up closely with PCP. Final Clinical Impression(s) / ED Diagnoses Final diagnoses:  Nonspecific chest pain  Hyperglycemia    Rx / DC Orders ED Discharge Orders         Ordered    metFORMIN (GLUCOPHAGE) 500 MG tablet  2 times daily     12/29/19 1309    pantoprazole (PROTONIX) 40 MG tablet  Daily     12/29/19 1309    famotidine (PEPCID) 20 MG tablet  2 times daily     12/29/19 1309           Pricilla Loveless, MD 12/29/19 1320

## 2020-10-14 ENCOUNTER — Other Ambulatory Visit: Payer: Self-pay

## 2020-10-14 ENCOUNTER — Emergency Department (HOSPITAL_COMMUNITY)
Admission: EM | Admit: 2020-10-14 | Discharge: 2020-10-14 | Disposition: A | Discharge status: Home / Self Care | Payer: Self-pay | Attending: Emergency Medicine | Admitting: Emergency Medicine

## 2020-10-14 ENCOUNTER — Encounter (HOSPITAL_COMMUNITY): Payer: Self-pay | Admitting: Psychiatry

## 2020-10-14 DIAGNOSIS — F17200 Nicotine dependence, unspecified, uncomplicated: Secondary | ICD-10-CM | POA: Insufficient documentation

## 2020-10-14 DIAGNOSIS — Z7984 Long term (current) use of oral hypoglycemic drugs: Secondary | ICD-10-CM | POA: Insufficient documentation

## 2020-10-14 DIAGNOSIS — E119 Type 2 diabetes mellitus without complications: Secondary | ICD-10-CM | POA: Insufficient documentation

## 2020-10-14 DIAGNOSIS — J020 Streptococcal pharyngitis: Secondary | ICD-10-CM | POA: Insufficient documentation

## 2020-10-14 DIAGNOSIS — Z794 Long term (current) use of insulin: Secondary | ICD-10-CM | POA: Insufficient documentation

## 2020-10-14 DIAGNOSIS — Z113 Encounter for screening for infections with a predominantly sexual mode of transmission: Secondary | ICD-10-CM | POA: Insufficient documentation

## 2020-10-14 LAB — GROUP A STREP BY PCR: Group A Strep by PCR: DETECTED — AB

## 2020-10-14 MED ORDER — LIDOCAINE VISCOUS HCL 2 % MT SOLN
15.0000 mL | Freq: Once | OROMUCOSAL | Status: AC
  Administered 2020-10-14: 15 mL via OROMUCOSAL
  Filled 2020-10-14: qty 15

## 2020-10-14 MED ORDER — PENICILLIN G BENZATHINE 1200000 UNIT/2ML IM SUSP
1.2000 10*6.[IU] | Freq: Once | INTRAMUSCULAR | Status: AC
  Administered 2020-10-14: 1.2 10*6.[IU] via INTRAMUSCULAR
  Filled 2020-10-14: qty 2

## 2020-10-14 MED ORDER — IBUPROFEN 400 MG PO TABS
600.0000 mg | ORAL_TABLET | Freq: Once | ORAL | Status: AC
  Administered 2020-10-14: 600 mg via ORAL
  Filled 2020-10-14: qty 1

## 2020-10-14 NOTE — ED Provider Notes (Signed)
[name] EMERGENCY DEPARTMENT Provider Note   CSN: [id number] Arrival date & time: 10/14/20  1401     History Chief Complaint  Patient presents with  . Sore Throat    [name] is a 54 y.o. male.  [name] is a 54 y.o. male with a history of diabetes who presents to the ED for evaluation of sore throat.  Symptoms have been present for 2 days.  He states that he has had a constant dull ache in the throat.  He reports pain with swallowing.  He states that he recently had contact with a prior sexual partner that he had gotten gonorrhea from before.  He states that a few years ago he had gonorrhea of the throat and had very similar symptoms.  He states that this time they did not have oral sex but he is still concerned he could have somehow contracted it.  He denies any fevers or chills.  States he has felt some swollen lymph nodes under his chin.  Denies any nasal congestion, rhinorrhea, cough.  No body aches.  No chest pain, shortness of breath, abdominal pain, nausea or vomiting.  No known sick contacts.  He has not taken any meds prior to arrival to treat symptoms.  No other aggravating or alleviating factors.        Past Medical History:  Diagnosis Date  . Diabetes mellitus without complication (HCC)     There are no problems to display for this patient.   Past Surgical History:  Procedure Laterality Date  . TONSILLECTOMY         No family history on file.  Social History   Tobacco Use  . Smoking status: Current Every Day Smoker    Packs/day: 1.00  . Smokeless tobacco: Never Used  Vaping Use  . Vaping Use: Never used  Substance Use Topics  . Alcohol use: No  . Drug use: No    Home Medications Prior to Admission medications   Medication Sig Start Date End Date Taking? Authorizing Provider  cyclobenzaprine (FLEXERIL) 5 MG tablet Take 1 tablet (5 mg total) by mouth 2 (two) times daily as needed for muscle spasms. 09/23/17   Horton,  Mayer Masker, MD  famotidine (PEPCID) 20 MG tablet Take 1 tablet (20 mg total) by mouth 2 (two) times daily. 12/29/19   Pricilla Loveless, MD  insulin aspart protamine- aspart (NOVOLOG MIX 70/30) (70-30) 100 UNIT/ML injection Inject 0.1 mLs (10 Units total) into the skin 2 (two) times daily. Used 10 units in the morning and 10 units before bed. 12/05/17   Gwyneth Sprout, MD  metFORMIN (GLUCOPHAGE) 500 MG tablet Take 1 tablet (500 mg total) by mouth 2 (two) times daily. 12/29/19   Pricilla Loveless, MD  pantoprazole (PROTONIX) 40 MG tablet Take 1 tablet (40 mg total) by mouth daily. 12/29/19   Pricilla Loveless, MD    Allergies    Patient has no known allergies.  Review of Systems   Review of Systems  Constitutional: Negative for chills and fever.  HENT: Positive for sore throat and trouble swallowing. Negative for congestion, ear pain and rhinorrhea.   Respiratory: Negative for cough and shortness of breath.   Cardiovascular: Negative for chest pain.  Gastrointestinal: Negative for abdominal pain, nausea and vomiting.  Musculoskeletal: Negative for myalgias.  Neurological: Negative for headaches.  All other systems reviewed and are negative.   Physical Exam Updated Vital Signs BP (!) 148/101   Pulse 98   Temp 99.5 F (  37.5 C) (Oral)   Resp 16   Ht 5\' 9"  (1.753 m)   Wt 93 kg   SpO2 98%   BMI 30.28 kg/m   Physical Exam Vitals and nursing note reviewed.  Constitutional:      General: He is not in acute distress.    Appearance: He is well-developed. He is not diaphoretic.  HENT:     Head: Normocephalic and atraumatic.     Right Ear: Tympanic membrane and ear canal normal.     Left Ear: Tympanic membrane and ear canal normal.     Nose: No congestion or rhinorrhea.     Mouth/Throat:     Tonsils: Tonsillar exudate present. 2+ on the right. 2+ on the left.     Comments: Posterior oropharynx is erythematous with some exudates noted, tonsils are surgically absent, uvula is midline,  tolerating secretions, normal phonation Eyes:     General:        Right eye: No discharge.        Left eye: No discharge.  Cardiovascular:     Rate and Rhythm: Normal rate and regular rhythm.  Pulmonary:     Effort: Pulmonary effort is normal. No respiratory distress.     Breath sounds: Normal breath sounds.     Comments: Respirations equal and unlabored, patient able to speak in full sentences, lungs clear to auscultation bilaterally Chest:     Chest wall: No tenderness.  Abdominal:     General: Bowel sounds are normal.     Palpations: Abdomen is soft.  Lymphadenopathy:     Cervical: Cervical adenopathy present.  Skin:    General: Skin is warm and dry.  Neurological:     Mental Status: He is alert and oriented to person, place, and time.     Coordination: Coordination normal.  Psychiatric:        Behavior: Behavior normal.     ED Results / Procedures / Treatments   Labs (all labs ordered are listed, but only abnormal results are displayed) Labs Reviewed  GROUP A STREP BY PCR - Abnormal; Notable for the following components:      Result Value   Group A Strep by PCR DETECTED (*)    All other components within normal limits  GC/CHLAMYDIA PROBE AMP ([place]) NOT AT [facility]    EKG None  Radiology No results found.  Procedures Procedures (including critical care time)  Medications Ordered in ED Medications  lidocaine (XYLOCAINE) 2 % viscous mouth solution 15 mL (15 mLs Mouth/Throat Given 10/14/20 1555)  ibuprofen (ADVIL) tablet 600 mg (600 mg Oral Given 10/14/20 1554)  penicillin g benzathine (BICILLIN LA) 1200000 UNIT/2ML injection 1.2 Million Units (1.2 Million Units Intramuscular Given 10/14/20 1752)    ED Course  I have reviewed the triage vital signs and the nursing notes.  Pertinent labs & imaging results that were available during my care of the patient were reviewed by me and considered in my medical decision making (see chart for details).    MDM  Rules/Calculators/A&P                         Pt febrile with erythema and exudate in the posterior oropharynx, tonsils are surgically absent, cervical lymphadenopathy, & dysphagia.  Patient reports history of gonorrhea of the throat and recent contact with an old partner, so we will also test for gonorrhea.  Patient strep test returned positive.  Treated in the ED with  NSAIDs, viscous lidocaine  and PCN IM.  Pt appears mildly dehydrated, discussed importance of water rehydration. Presentation non concerning for PTA or RPA. No trismus or uvula deviation. Specific return precautions discussed. Pt able to drink water in ED without difficulty with intact air way. Recommended PCP follow up.     Final Clinical Impression(s) / ED Diagnoses Final diagnoses:  Strep throat    Rx / DC Orders ED Discharge Orders    None       Legrand Rams 10/14/20 1802    Tegeler, Canary Brim, MD 10/18/20 [phone]

## 2020-10-14 NOTE — Discharge Instructions (Signed)
You were treated today with antibiotics for strep throat, this should start to improve over the next 48 hours.  You may use ibuprofen and Tylenol every 6 hours for pain as well as Cepacol throat lozenges.  Make sure you are drinking plenty of fluids.  Return for worsening throat pain, fevers, difficulty swallowing or breathing or any other new or concerning symptoms.  

## 2020-10-14 NOTE — ED Triage Notes (Signed)
Patient arrived POV, with complaints of sore throat pain and painful swallowing, since yesterday. Patient had oral sex with a male partner and she is known to have a sexually transmitted infection. Patient feels he may have an STI. VSS NAD noted

## 2020-10-15 LAB — GC/CHLAMYDIA PROBE AMP (~~LOC~~) NOT AT ARMC
Chlamydia: NEGATIVE
Comment: NEGATIVE
Comment: NORMAL
Neisseria Gonorrhea: POSITIVE — AB

## 2021-12-14 ENCOUNTER — Other Ambulatory Visit: Payer: Self-pay

## 2021-12-14 ENCOUNTER — Emergency Department (HOSPITAL_COMMUNITY): Payer: Self-pay

## 2021-12-14 ENCOUNTER — Observation Stay (HOSPITAL_COMMUNITY): Payer: Self-pay

## 2021-12-14 ENCOUNTER — Observation Stay (HOSPITAL_COMMUNITY)
Admission: EM | Admit: 2021-12-14 | Discharge: 2021-12-15 | Disposition: A | Discharge status: Home / Self Care | Payer: Self-pay | Attending: Family Medicine | Admitting: Family Medicine

## 2021-12-14 DIAGNOSIS — E11621 Type 2 diabetes mellitus with foot ulcer: Principal | ICD-10-CM | POA: Diagnosis present

## 2021-12-14 DIAGNOSIS — Z20822 Contact with and (suspected) exposure to covid-19: Secondary | ICD-10-CM | POA: Insufficient documentation

## 2021-12-14 DIAGNOSIS — Z7984 Long term (current) use of oral hypoglycemic drugs: Secondary | ICD-10-CM | POA: Insufficient documentation

## 2021-12-14 DIAGNOSIS — R6510 Systemic inflammatory response syndrome (SIRS) of non-infectious origin without acute organ dysfunction: Secondary | ICD-10-CM

## 2021-12-14 DIAGNOSIS — S99922A Unspecified injury of left foot, initial encounter: Secondary | ICD-10-CM

## 2021-12-14 DIAGNOSIS — L97528 Non-pressure chronic ulcer of other part of left foot with other specified severity: Secondary | ICD-10-CM

## 2021-12-14 DIAGNOSIS — Z720 Tobacco use: Secondary | ICD-10-CM

## 2021-12-14 DIAGNOSIS — Z794 Long term (current) use of insulin: Secondary | ICD-10-CM | POA: Insufficient documentation

## 2021-12-14 DIAGNOSIS — G629 Polyneuropathy, unspecified: Secondary | ICD-10-CM

## 2021-12-14 DIAGNOSIS — Z79899 Other long term (current) drug therapy: Secondary | ICD-10-CM | POA: Insufficient documentation

## 2021-12-14 DIAGNOSIS — L97509 Non-pressure chronic ulcer of other part of unspecified foot with unspecified severity: Secondary | ICD-10-CM | POA: Diagnosis present

## 2021-12-14 DIAGNOSIS — L97529 Non-pressure chronic ulcer of other part of left foot with unspecified severity: Secondary | ICD-10-CM | POA: Insufficient documentation

## 2021-12-14 DIAGNOSIS — F17210 Nicotine dependence, cigarettes, uncomplicated: Secondary | ICD-10-CM | POA: Insufficient documentation

## 2021-12-14 DIAGNOSIS — I10 Essential (primary) hypertension: Secondary | ICD-10-CM | POA: Insufficient documentation

## 2021-12-14 DIAGNOSIS — E1165 Type 2 diabetes mellitus with hyperglycemia: Secondary | ICD-10-CM

## 2021-12-14 LAB — CBC WITH DIFFERENTIAL/PLATELET
Abs Immature Granulocytes: 0.04 10*3/uL (ref 0.00–0.07)
Basophils Absolute: 0 10*3/uL (ref 0.0–0.1)
Basophils Relative: 0 %
Eosinophils Absolute: 0.1 10*3/uL (ref 0.0–0.5)
Eosinophils Relative: 1 %
HCT: 39.7 % (ref 39.0–52.0)
Hemoglobin: 12.3 g/dL — ABNORMAL LOW (ref 13.0–17.0)
Immature Granulocytes: 0 %
Lymphocytes Relative: 16 %
Lymphs Abs: 1.9 10*3/uL (ref 0.7–4.0)
MCH: 26.2 pg (ref 26.0–34.0)
MCHC: 31 g/dL (ref 30.0–36.0)
MCV: 84.5 fL (ref 80.0–100.0)
Monocytes Absolute: 0.6 10*3/uL (ref 0.1–1.0)
Monocytes Relative: 5 %
Neutro Abs: 8.9 10*3/uL — ABNORMAL HIGH (ref 1.7–7.7)
Neutrophils Relative %: 78 %
Platelets: 344 10*3/uL (ref 150–400)
RBC: 4.7 MIL/uL (ref 4.22–5.81)
RDW: 13.4 % (ref 11.5–15.5)
WBC: 11.6 10*3/uL — ABNORMAL HIGH (ref 4.0–10.5)
nRBC: 0 % (ref 0.0–0.2)

## 2021-12-14 LAB — LACTIC ACID, PLASMA
Lactic Acid, Venous: 2.4 mmol/L (ref 0.5–1.9)
Lactic Acid, Venous: 2.1 mmol/L (ref 0.5–1.9)
Lactic Acid, Venous: 1.3 mmol/L (ref 0.5–1.9)

## 2021-12-14 LAB — RESP PANEL BY RT-PCR (FLU A&B, COVID) ARPGX2
Influenza A by PCR: NEGATIVE
Influenza B by PCR: NEGATIVE
SARS Coronavirus 2 by RT PCR: NEGATIVE

## 2021-12-14 LAB — C-REACTIVE PROTEIN: CRP: 5.1 mg/dL — ABNORMAL HIGH (ref <1.0)

## 2021-12-14 LAB — RAPID URINE DRUG SCREEN, HOSP PERFORMED
Amphetamines: NOT DETECTED
Barbiturates: NOT DETECTED
Benzodiazepines: NOT DETECTED
Cocaine: POSITIVE — AB
Opiates: NOT DETECTED
Tetrahydrocannabinol: NOT DETECTED

## 2021-12-14 LAB — COMPREHENSIVE METABOLIC PANEL
ALT: 15 U/L (ref 0–44)
AST: 13 U/L — ABNORMAL LOW (ref 15–41)
Albumin: 3.8 g/dL (ref 3.5–5.0)
Alkaline Phosphatase: 83 U/L (ref 38–126)
Anion gap: 9 (ref 5–15)
BUN: 21 mg/dL — ABNORMAL HIGH (ref 6–20)
CO2: 24 mmol/L (ref 22–32)
Calcium: 9.6 mg/dL (ref 8.9–10.3)
Chloride: 101 mmol/L (ref 98–111)
Creatinine, Ser: 1.35 mg/dL — ABNORMAL HIGH (ref 0.61–1.24)
GFR, Estimated: >60 mL/min (ref >60)
Glucose, Bld: 325 mg/dL — ABNORMAL HIGH (ref 70–99)
Potassium: 3.7 mmol/L (ref 3.5–5.1)
Sodium: 134 mmol/L — ABNORMAL LOW (ref 135–145)
Total Bilirubin: 0.5 mg/dL (ref 0.3–1.2)
Total Protein: 7.5 g/dL (ref 6.5–8.1)

## 2021-12-14 LAB — CBG MONITORING, ED
Glucose-Capillary: 471 mg/dL — ABNORMAL HIGH (ref 70–99)
Glucose-Capillary: 351 mg/dL — ABNORMAL HIGH (ref 70–99)

## 2021-12-14 LAB — URINALYSIS, ROUTINE W REFLEX MICROSCOPIC
Bilirubin Urine: NEGATIVE
Glucose, UA: >=500 mg/dL — AB
Ketones, ur: NEGATIVE mg/dL
Leukocytes,Ua: NEGATIVE
Nitrite: NEGATIVE
Protein, ur: 30 mg/dL — AB
Specific Gravity, Urine: >1.03 — ABNORMAL HIGH (ref 1.005–1.030)
pH: 5.5 (ref 5.0–8.0)

## 2021-12-14 LAB — LIPID PANEL
Cholesterol: 101 mg/dL (ref 0–200)
HDL: 29 mg/dL — ABNORMAL LOW (ref >40)
LDL Cholesterol: 45 mg/dL (ref 0–99)
Total CHOL/HDL Ratio: 3.5 RATIO
Triglycerides: 134 mg/dL (ref <150)
VLDL: 27 mg/dL (ref 0–40)

## 2021-12-14 LAB — URINALYSIS, MICROSCOPIC (REFLEX): Bacteria, UA: NONE SEEN

## 2021-12-14 LAB — BASIC METABOLIC PANEL
Anion gap: 9 (ref 5–15)
BUN: 22 mg/dL — ABNORMAL HIGH (ref 6–20)
CO2: 24 mmol/L (ref 22–32)
Calcium: 8.7 mg/dL — ABNORMAL LOW (ref 8.9–10.3)
Chloride: 100 mmol/L (ref 98–111)
Creatinine, Ser: 1.38 mg/dL — ABNORMAL HIGH (ref 0.61–1.24)
GFR, Estimated: >60 mL/min (ref >60)
Glucose, Bld: 480 mg/dL — ABNORMAL HIGH (ref 70–99)
Potassium: 4 mmol/L (ref 3.5–5.1)
Sodium: 133 mmol/L — ABNORMAL LOW (ref 135–145)

## 2021-12-14 LAB — APTT: aPTT: 26 seconds (ref 24–36)

## 2021-12-14 LAB — CK: Total CK: 126 U/L (ref 49–397)

## 2021-12-14 LAB — GLUCOSE, CAPILLARY: Glucose-Capillary: 248 mg/dL — ABNORMAL HIGH (ref 70–99)

## 2021-12-14 LAB — HIV ANTIBODY (ROUTINE TESTING W REFLEX): HIV Screen 4th Generation wRfx: NONREACTIVE

## 2021-12-14 LAB — HEMOGLOBIN A1C
Hgb A1c MFr Bld: 12 % — ABNORMAL HIGH (ref 4.8–5.6)
Mean Plasma Glucose: 297.7 mg/dL

## 2021-12-14 LAB — PROTIME-INR
INR: 1 (ref 0.8–1.2)
Prothrombin Time: 13.1 seconds (ref 11.4–15.2)

## 2021-12-14 LAB — SEDIMENTATION RATE: Sed Rate: 25 mm/hr — ABNORMAL HIGH (ref 0–16)

## 2021-12-14 MED ORDER — ACETAMINOPHEN 325 MG PO TABS: 650.0000 mg | ORAL_TABLET | Freq: Four times a day (QID) | ORAL | Status: DC | PRN

## 2021-12-14 MED ORDER — SODIUM CHLORIDE 0.9 % IV SOLN
2.0000 g | Freq: Once | INTRAVENOUS | Status: AC
  Administered 2021-12-14: 2 g via INTRAVENOUS
  Filled 2021-12-14: qty 2

## 2021-12-14 MED ORDER — VANCOMYCIN HCL 2000 MG/400ML IV SOLN
2000.0000 mg | Freq: Once | INTRAVENOUS | Status: AC
  Administered 2021-12-14: 2000 mg via INTRAVENOUS
  Filled 2021-12-14: qty 400

## 2021-12-14 MED ORDER — METRONIDAZOLE 500 MG PO TABS
500.0000 mg | ORAL_TABLET | Freq: Two times a day (BID) | ORAL | Status: DC
  Administered 2021-12-14 – 2021-12-15 (×3): 500 mg via ORAL
  Filled 2021-12-14 (×3): qty 1

## 2021-12-14 MED ORDER — ENOXAPARIN SODIUM 40 MG/0.4ML IJ SOSY
40.0000 mg | PREFILLED_SYRINGE | INTRAMUSCULAR | Status: DC
  Filled 2021-12-14: qty 0.4

## 2021-12-14 MED ORDER — INSULIN ASPART 100 UNIT/ML IJ SOLN
0.0000 [IU] | Freq: Three times a day (TID) | INTRAMUSCULAR | Status: DC
  Administered 2021-12-14 (×2): 9 [IU] via SUBCUTANEOUS
  Administered 2021-12-15: 5 [IU] via SUBCUTANEOUS

## 2021-12-14 MED ORDER — LACTATED RINGERS IV BOLUS
1000.0000 mL | Freq: Once | INTRAVENOUS | Status: AC
  Administered 2021-12-14: 1000 mL via INTRAVENOUS

## 2021-12-14 MED ORDER — INSULIN GLARGINE-YFGN 100 UNIT/ML ~~LOC~~ SOLN
5.0000 [IU] | Freq: Every day | SUBCUTANEOUS | Status: DC
  Administered 2021-12-14: 5 [IU] via SUBCUTANEOUS
  Filled 2021-12-14 (×2): qty 0.05

## 2021-12-14 NOTE — ED Provider Notes (Signed)
ATTENDING SUPERVISORY NOTE I have personally viewed the imaging studies performed, and I was present for key and critical portions of the procedure as documented. I have personally seen and examined the patient, and discussed the plan of care with the resident.  I have reviewed the documentation of the resident and agree.   No diagnosis found.  .Critical Care Performed by: Blane Ohara, MD Authorized by: Blane Ohara, MD   Critical care provider statement:    Critical care time (minutes):  30   Critical care start time:  12/14/2021 9:00 AM   Critical care end time:  12/14/2021 9:30 AM   Critical care time was exclusive of:  Separately billable procedures and treating other patients and teaching time   Critical care was necessary to treat or prevent imminent or life-threatening deterioration of the following conditions:  Sepsis   Critical care was time spent personally by me on the following activities:  Development of treatment plan with patient or surrogate, evaluation of patient's response to treatment, examination of patient, ordering and review of laboratory studies, ordering and review of radiographic studies, ordering and performing treatments and interventions, pulse oximetry, re-evaluation of patient's condition and review of old charts    Blane Ohara, MD 12/16/21 1341

## 2021-12-14 NOTE — ED Provider Notes (Signed)
[facility] EMERGENCY DEPARTMENT Provider Note   CSN: [id number] Arrival date & time: 12/14/21  0335     History  Chief Complaint  Patient presents with   Foot Injury    [name] is a 56 y.o. male.  [name] is a 56 year old gentleman with past medical history of diabetes mellitus who presents after noticing a L foot injury on 01/10.  He states that he had returned home from work and was preparing to shower and when he took his sock off he noticed a wet, bloody appearing area on the sock.  When he looked at the foot he noticed the injured area and presented to the ED for evaluation.  He states that the area is nonpainful and he did not know that it was there.  He denies baseline neuropathy though does have some tingling.  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  He was wearing boots on the day that he noticed this injury.  He states that he examined the soles of the boots as well as sneakers that he wears to work and did not notice anything penetrating through the soles.  He states that there was nothing in the shoe itself.    The history is provided by the patient.  Foot Injury Location:  Foot Injury: no   Foot location:  L foot Pain details:    Radiates to:  Does not radiate   Severity:  No pain Chronicity:  New Foreign body present:  Unable to specify Prior injury to area:  No Associated symptoms: swelling and tingling   Associated symptoms: no fatigue and no fever       Home Medications Prior to Admission medications   Medication Sig Start Date End Date Taking? Authorizing Provider  famotidine (PEPCID) 20 MG tablet Take 1 tablet (20 mg total) by mouth 2 (two) times daily. Patient taking differently: Take 20 mg by mouth 2 (two) times daily as needed for heartburn or indigestion. 12/29/19  Yes Sherwood Gambler, MD  insulin aspart protamine-  aspart (NOVOLOG MIX 70/30) (70-30) 100 UNIT/ML injection Inject 0.1 mLs (10 Units total) into the skin 2 (two) times daily. Used 10 units in the morning and 10 units before bed. 12/05/17  Yes Blanchie Dessert, MD  metFORMIN (GLUCOPHAGE) 500 MG tablet Take 1 tablet (500 mg total) by mouth 2 (two) times daily. 12/29/19  Yes Sherwood Gambler, MD  cyclobenzaprine (FLEXERIL) 5 MG tablet Take 1 tablet (5 mg total) by mouth 2 (two) times daily as needed for muscle spasms. Patient not taking: Reported on 12/14/2021 09/23/17   Horton, Barbette Hair, MD  pantoprazole (PROTONIX) 40 MG tablet Take 1 tablet (40 mg total) by mouth daily. Patient not taking: Reported on 12/14/2021 12/29/19   Sherwood Gambler, MD      Allergies    Patient has no known allergies.    Review of Systems   Review of Systems  Constitutional:  Negative for activity change, chills, diaphoresis, fatigue and fever.  Respiratory:  Negative for shortness of breath.   Cardiovascular:  Negative for chest pain and leg swelling.  Gastrointestinal:  Negative for abdominal pain, constipation and diarrhea.  Genitourinary:  Negative for dysuria and frequency.  Musculoskeletal:  Negative for gait problem and myalgias.  Skin:  Positive for wound.  Neurological:  Negative for weakness.   Physical Exam Updated Vital Signs BP 136/76  Pulse (!) 104    Temp 98.7 F (37.1 C) (Oral)    Resp 16    Ht 5\' 9"  (1.753 m)    Wt 93 kg    SpO2 100%    BMI 30.27 kg/m  Physical Exam Vitals reviewed.  Constitutional:      General: He is not in acute distress.    Appearance: Normal appearance. He is not ill-appearing.  HENT:     Head: Normocephalic and atraumatic.  Eyes:     Extraocular Movements: Extraocular movements intact.     Conjunctiva/sclera: Conjunctivae normal.  Cardiovascular:     Rate and Rhythm: Normal rate and regular rhythm.     Pulses: Normal pulses.          Dorsalis pedis pulses are 2+ on the right side and 2+ on the left side.        Posterior tibial pulses are 2+ on the right side and 2+ on the left side.     Heart sounds: Normal heart sounds.  Pulmonary:     Effort: Pulmonary effort is normal.     Breath sounds: Normal breath sounds.  Abdominal:     General: There is no distension.     Palpations: Abdomen is soft.     Tenderness: There is no abdominal tenderness.  Musculoskeletal:     Cervical back: Normal range of motion.       Feet:  Feet:     Right foot:     Skin integrity: Callus (Plantar surface of great toe) present.     Toenail Condition: Right toenails are normal.     Left foot:     Skin integrity: Erythema and warmth present.     Toenail Condition: Left toenails are normal.     Comments: Left foot with a 2 cm x 2 cm wound that probes approximately 1 cm and tracks medially approximately 1 cm.  While probing the wound, a small hard white object came out of the wound.  There is increased fluctuance extending distally into the lateral aspect of the fifth toe.  The dorsal aspect of the left foot is with increased warmth and edema as compared to the right foot.  He does report mild tenderness to palpation of the lateral aspect of the left foot.  Erythema is present on the dorsal aspect tracking to approximately the midfoot.  There is no drainage however there is a foul smell coming from the wound. Skin:    General: Skin is warm and dry.  Neurological:     General: No focal deficit present.     Mental Status: He is alert and oriented to person, place, and time.  Psychiatric:        Mood and Affect: Mood normal.    ED Results / Procedures / Treatments   Labs (all labs ordered are listed, but only abnormal results are displayed) Labs Reviewed  LACTIC ACID, PLASMA - Abnormal; Notable for the following components:      Result Value   Lactic Acid, Venous 2.4 (*)    All other components within normal limits  COMPREHENSIVE METABOLIC PANEL - Abnormal; Notable for the following components:   Sodium 134 (*)     Glucose, Bld 325 (*)    BUN 21 (*)    Creatinine, Ser 1.35 (*)    AST 13 (*)    All other components within normal limits  CBC WITH DIFFERENTIAL/PLATELET - Abnormal; Notable for the following components:   WBC 11.6 (*)  Hemoglobin 12.3 (*)    Neutro Abs 8.9 (*)    All other components within normal limits  URINALYSIS, ROUTINE W REFLEX MICROSCOPIC - Abnormal; Notable for the following components:   Specific Gravity, Urine >1.030 (*)    Glucose, UA >=500 (*)    Hgb urine dipstick SMALL (*)    Protein, ur 30 (*)    All other components within normal limits  SEDIMENTATION RATE - Abnormal; Notable for the following components:   Sed Rate 25 (*)    All other components within normal limits  C-REACTIVE PROTEIN - Abnormal; Notable for the following components:   CRP 5.1 (*)    All other components within normal limits  CULTURE, BLOOD (ROUTINE X 2)  CULTURE, BLOOD (ROUTINE X 2)  RESP PANEL BY RT-PCR (FLU A&B, COVID) ARPGX2  PROTIME-INR  APTT  URINALYSIS, MICROSCOPIC (REFLEX)  LACTIC ACID, PLASMA    EKG None  Radiology DG Foot Complete Left  Result Date: 12/14/2021 CLINICAL DATA:  Diabetic wound with worsening cellulitis. Foot injury. EXAM: LEFT FOOT - COMPLETE 3+ VIEW COMPARISON:  None. FINDINGS: Swelling and gas at the lateral forefoot wound with superficial radiodense foreign body measuring up to 2 mm. No visible osteomyelitis or tracking soft tissue emphysema. No acute fracture. Achilles enthesophyte. IMPRESSION: Lateral forefoot wound with 2 mm foreign body. No visible osseous infection. Electronically Signed   By: Tiburcio Pea M.D.   On: 12/14/2021 05:01    Procedures .Foreign Body Removal  Date/Time: 12/14/2021 9:11 AM Performed by: Champ Mungo, DO Authorized by: Blane Ohara, MD  Consent: Verbal consent obtained. Consent given by: patient Imaging studies: imaging studies available Intake: L foot. Objects recovered: Subcentimeter, smooth white  onject Post-procedure assessment: foreign body removed Patient tolerance: patient tolerated the procedure well with no immediate complications   Medications Ordered in ED Medications  acetaminophen (TYLENOL) tablet 650 mg (has no administration in time range)  vancomycin (VANCOREADY) IVPB 2000 mg/400 mL (2,000 mg Intravenous New Bag/Given 12/14/21 1030)  ceFEPIme (MAXIPIME) 2 g in sodium chloride 0.9 % 100 mL IVPB (0 g Intravenous Stopped 12/14/21 1002)  lactated ringers bolus 1,000 mL (0 mLs Intravenous Stopped 12/14/21 1020)    ED Course/ Medical Decision Making/ A&P                           Medical Decision Making Amount and/or Complexity of Data Reviewed Discussion of management or test interpretation with external provider(s): Patient was discussed with my attending, Dr. Jodi Mourning.   Critical Care Total time providing critical care: < 30 minutes  0849: Patient hemodynamically stable at time of exam though should be noted he was febrile to 100.4 at 0547.  Initial labs reveal elevated lactate of 2.4 with leukocytosis 12.6.  CRP is elevated at 5.1 with sed rate of 25. UA remarkable for glucose 1 and 500, small hemoglobin.  CMP with mild elevation serum creatinine of 1.35 though this is comparable to measurements over the last several years. Xray L foot revealed swelling is at the lateral forefoot wound with superficial 2 mm foreign body, without visible osteomyelitis or tracking soft tissue emphysema; no acute fracture noted. Blood cultures have been collected at this time.    Patient is high risk given medical history of diabetes and initial work-up as above.  He meets SIRS criteria with tachycardia, leukocytosis.  Differential includes cellulitis, necrotizing skin wound, osteomyelitis. Treatment of this wound will need to be aggressive and include coverage for MRSA  and Pseudomonas. Last Tdap noted in 2015 so he is up to date at this time.  LR 1 L bolus ordered.  Will give one-time dose  cefepime and 2 g IV, vancomycin 2,000 mg IV.  1009: Spoke with family medicine for admission for L foot infection.   Final Clinical Impression(s) / ED Diagnoses Final diagnoses:  Injury of left foot, initial encounter    Rx / DC Orders ED Discharge Orders     None         Farrel Gordon, DO 12/14/21 1043    Elnora Morrison, MD 12/16/21 1300

## 2021-12-14 NOTE — Progress Notes (Signed)
Inpatient Diabetes Program Recommendations  AACE/ADA: New Consensus Statement on Inpatient Glycemic Control (2015)  Target Ranges:  Prepandial:   less than 140 mg/dL      Peak postprandial:   less than 180 mg/dL (1-2 hours)      Critically ill patients:  140 - 180 mg/dL   Lab Results  Component Value Date   GLUCAP 471 (H) 12/14/2021   HGBA1C 12.0 (H) 12/14/2021    Review of Glycemic Control  Diabetes history: type 2 Outpatient Diabetes medications: 70/30 10 units BID, Metformin 500 mg BID Current orders for Inpatient glycemic control: Semglee 5 units at HS, Novolog SENSITIVE correction scale TID  Inpatient Diabetes Program Recommendations:   Spoke with patient at the bedside. States that he was diagnosed with diabetes about 8-10 years ago. States that his PCP was in [place], but has not found one here yet. States that he checks his blood sugars about 50% of the time, but states that he needs a new one. Patient states that he has been taking his insulin. He never mentioned any financial issues.   Discussed general foot care for patients with diabetes. States that he does have feeling in his feet and legs and only discovered this place on his foot last night. Was given information on foot care. Patient was then taken to MRI from the ED.   Will continue to monitor blood sugars while in the hospital.  Smith Mince RN BSN CDE Diabetes Coordinator Pager: [phone]  8am-5pm

## 2021-12-14 NOTE — H&P (Signed)
[facility] Admission History and Physical Service Pager: [phone]  Patient name: [name] Medical record number: [id number] Date of birth: Jul 12, 1966 Age: 56 y.o. Gender: male  Primary Care Provider: Patient, No Pcp Per (Inactive) Consultants: None Code Status: Full  Preferred Emergency Contact: [name] (brother) [phone]  Chief Complaint: left foot ulcer  Assessment and Plan: [name] is a 56 y.o. male presenting with left foot wound likely secondary to diabetic ulcer . PMH is significant for Type 2 DM  Left Diabetic wound Meeting SIRs criteria Found to have left lateral foot wound.  Xray non revealing for osteomyelitis but notable for 2 mm radiodense foreign body which was removed by ED provider.  Labs significant for leukocytosis, lactic acidosis, elevated CRP and ESR.  Received LR bolus 1L in ED and started on Cefepime and Vancomycin, covering for MRSA and Pseudomonas. Tdap recent (2015). Blood cultures pending.  On exam open ulcer without drainage but foul smelling noted on lateral aspect of left foot at distal.  Probe used to track for approx 0.5 cm sinus laterally.  Sensation normal bilaterally.  Will admit for IV antibiotics and further work up.  Suspect patient has some diabetic neuropathy given ulcer present for unknown amount of time.   -Admit to Med Surg, Attending Dr [name] -Continue Vancomycin (01/11-) -Continue Cefepime (01/11-) -Start Flagyl 500 mg po TID (01/11-) -Narrow antibiotics when appropriate -MRI w/o contrast Lt foot -Bilateral ABI -Follow up blood cultures -Likely will need Ortho consult pending MRI results -Neurovascular per unit -Vital signs per unit -PT/OT in am -Lovenox for VTE -Carb modified diet -Incentive spirometer q2h while awake -Bmet/CBC in am -Repeat LA today  HTN Initially found to be hypertensive in ED.  Now normotensive.  Has never been on medications in past. -Continue to monitor.  If continues  to remain >130/80 would initiate ACE/ARB   DM Type 2 Was prescribed Metformin 500 mg BID and Novolog 70/30 10 units BID. Has not taken medications in over 6 months due to financial reasons.  Denies any weight loss but endorses weight gain of 15 lbs in last 6-7 months. Polyphagia and polyuria present.  No recent A1c -Lantus 5 u daily -sSSI  -Diabetic Educator referral -A1c, Lipids today -Consider statin, ACE  -TOC referral for PCP needs  GERD Takes Pepcid as needed.  Last taken about 4 months ago.  Currently asymptomatic. -Will hold medication for now  Cocaine use Recent use few days ago.  No IVDU.  Likely contributing to HTN and tachycardia and possible LA and elevated Cr. -Repeat LA -CK today -UDS -Vital signs per unit   FEN/GI: Carb Modified Prophylaxis: Lovenox  Disposition: Med Surg, Attending Dr [name]  History of Present Illness:  [name] is a 56 y.o. male presenting with left open wound.  Was getting in shower last night when noticed some drainage on sock. Removed for small amount red tinged clear fluid. Endorses swelling and a little redness at site.  Initially had some pain but once drainage came out pain resolved.  Denies any pain, fevers or chills, weakness, numbness.  Admits to intermittent tingling sensations.  Brother at bedside reports noticed that patient was limping a couple of days ago that has since resolved.   Endorses Tobacco use 1 pack every 1.2 days Cocaine use in past week.  No IVDU No THC use EtOH social, last drink New Years  Review Of Systems: Per HPI with the following additions:   Review of Systems  Constitutional:  Negative for activity change, appetite change, chills, fatigue, fever and unexpected weight change.  HENT:  Negative for sore throat.   Eyes:  Negative for visual disturbance.  Respiratory:  Negative for cough, chest tightness, shortness of breath and wheezing.   Cardiovascular:  Negative for chest pain and palpitations.   Gastrointestinal:  Positive for constipation. Negative for blood in stool, diarrhea, nausea and vomiting.  Genitourinary:  Positive for frequency. Negative for decreased urine volume, difficulty urinating, dysuria, hematuria and urgency.  Musculoskeletal:  Negative for gait problem, joint swelling and myalgias.  Skin:  Positive for wound. Negative for rash.  Neurological:  Negative for dizziness, weakness, light-headedness, numbness and headaches.  Psychiatric/Behavioral:  Negative for agitation.     There are no problems to display for this patient.   Past Medical History: Past Medical History:  Diagnosis Date   Diabetes mellitus without complication ([place])     Past Surgical History: Past Surgical History:  Procedure Laterality Date   TONSILLECTOMY      Social History: Social History   Tobacco Use   Smoking status: Every Day    Packs/day: 1.00    Types: Cigarettes   Smokeless tobacco: Never  Vaping Use   Vaping Use: Never used  Substance Use Topics   Alcohol use: No   Drug use: No   Additional social history: Cocaine use, Tobacco use  Please also refer to relevant sections of EMR.  Family History: No family history on file.  Allergies and Medications: No Known Allergies No current facility-administered medications on file prior to encounter.   Current Outpatient Medications on File Prior to Encounter  Medication Sig Dispense Refill   famotidine (PEPCID) 20 MG tablet Take 1 tablet (20 mg total) by mouth 2 (two) times daily. (Patient taking differently: Take 20 mg by mouth 2 (two) times daily as needed for heartburn or indigestion.) 30 tablet 0   insulin aspart protamine- aspart (NOVOLOG MIX 70/30) (70-30) 100 UNIT/ML injection Inject 0.1 mLs (10 Units total) into the skin 2 (two) times daily. Used 10 units in the morning and 10 units before bed. 10 mL 0   metFORMIN (GLUCOPHAGE) 500 MG tablet Take 1 tablet (500 mg total) by mouth 2 (two) times daily. 60 tablet 1    cyclobenzaprine (FLEXERIL) 5 MG tablet Take 1 tablet (5 mg total) by mouth 2 (two) times daily as needed for muscle spasms. (Patient not taking: Reported on 12/14/2021) 10 tablet 0   pantoprazole (PROTONIX) 40 MG tablet Take 1 tablet (40 mg total) by mouth daily. (Patient not taking: Reported on 12/14/2021) 30 tablet 1    Objective: BP (!) 136/93    Pulse (!) 106    Temp 98.7 F (37.1 C) (Oral)    Resp 17    Ht 5' 9"  (1.753 m)    Wt 93 kg    SpO2 97%    BMI 30.27 kg/m  Exam: General: Alert and oriented, no apparent distress  Eyes: PEERLA ENTM: No pharyengeal erythema Neck: nontender Cardiovascular: RRR with no murmurs noted Respiratory: CTA bilaterally  Gastrointestinal: Bowel sounds present. No abdominal pain MSK: Upper extremity strength 5/5 bilaterally, Lower extremity strength 5/5 bilaterally  Derm: 0.5 cm tracking noted with probe, no drainage or bleeding appreciated     Neuro: Sensation, motor intact.  Gait not assessed  Psych: Behavior and speech appropriate to situation   Labs and Imaging: CBC BMET  Recent Labs  Lab 12/14/21 0452  WBC 11.6*  HGB 12.3*  HCT 39.7  PLT  344   Recent Labs  Lab 12/14/21 0452  NA 134*  K 3.7  CL 101  CO2 24  BUN 21*  CREATININE 1.35*  GLUCOSE 325*  CALCIUM 9.6     EKG: Sinus Tachycardia without ST elevation  DG Foot Complete Left  Result Date: 12/14/2021 CLINICAL DATA:  Diabetic wound with worsening cellulitis. Foot injury. EXAM: LEFT FOOT - COMPLETE 3+ VIEW COMPARISON:  None. FINDINGS: Swelling and gas at the lateral forefoot wound with superficial radiodense foreign body measuring up to 2 mm. No visible osteomyelitis or tracking soft tissue emphysema. No acute fracture. Achilles enthesophyte. IMPRESSION: Lateral forefoot wound with 2 mm foreign body. No visible osseous infection. Electronically Signed   By: Jorje Guild M.D.   On: 12/14/2021 05:01     Carollee Leitz, MD 12/14/2021, 10:14 AM PGY-3, [facility] Intern pager: [phone], text pages welcome

## 2021-12-14 NOTE — ED Triage Notes (Signed)
Pt states noticing a hole to the sole of this left foot. Unknown injury. Site with discoloration and foul drainage. HX diabetes. No fevers/chills.

## 2021-12-14 NOTE — ED Notes (Signed)
Admitting messaged for CBG 471

## 2021-12-14 NOTE — ED Provider Triage Note (Signed)
Emergency Medicine Provider Triage Evaluation Note  [name] , a 56 y.o. male  was evaluated in triage.  Pt complains of left foot pain.  Patient states earlier tonight he noticed a new wound to the plantar aspect of his left foot.  He states that the wound opened and began draining a large amount of purulent foul-smelling discharge.  Denies any fevers, chills, nausea, or vomiting.  Does note history of diabetes mellitus.  Physical Exam  BP (!) 181/94 (BP Location: Right Arm)    Pulse (!) 107    Temp 100.3 F (37.9 C) (Oral)    Resp 16    Ht 5\' 9"  (1.753 m)    Wt 93 kg    SpO2 100%    BMI 30.27 kg/m  Gen:   Awake, no distress   Resp:  Normal effort  MSK:   Moves extremities without difficulty  Other:  Diabetic ulcer noted to the plantar aspect of the left foot just proximal to the fifth toe.  Small on a purulent discharge noted at the site.  Foul-smelling odor.  Erythema noted in the region as well as the dorsum of the foot concerning for cellulitis.  Medical Decision Making  Medically screening exam initiated at 4:18 AM.  Appropriate orders placed.  Theodis Kinsel was informed that the remainder of the evaluation will be completed by another provider, this initial triage assessment does not replace that evaluation, and the importance of remaining in the ED until their evaluation is complete.   [name], PA-C 12/14/21 [phone]

## 2021-12-14 NOTE — Progress Notes (Signed)
Went to round on patient at the start of shift.  Patient was resting comfortably in bed in no distress.  He had no complaints or concerns.  BP (!) 151/83 (BP Location: Right Arm)    Pulse 87    Temp 98.7 F (37.1 C)    Resp 16    Ht 5\' 9"  (1.753 m)    Wt 93 kg    SpO2 100%    BMI 30.27 kg/m   General: Pleasant gentleman lying in bed in no distress Respiratory: Clear to auscultation bilaterally Cardiac: RRR Lower extremity: 0.5 cm ulcer noted on the lateral aspect of the plantar left foot.  No drainage noted.  A/P MRI did not show any signs of osteomyelitis.  He was treated with antibiotics with cefepime and vancomycin as well as metronidazole today while we were awaiting the MRI.  He has no concerns or complaints tonight.  We will monitor his vitals and blood sugars.  Remainder per day team note.

## 2021-12-15 ENCOUNTER — Other Ambulatory Visit (HOSPITAL_COMMUNITY): Payer: Self-pay

## 2021-12-15 ENCOUNTER — Observation Stay (HOSPITAL_BASED_OUTPATIENT_CLINIC_OR_DEPARTMENT_OTHER): Payer: Self-pay

## 2021-12-15 DIAGNOSIS — L0390 Cellulitis, unspecified: Secondary | ICD-10-CM

## 2021-12-15 DIAGNOSIS — S99922A Unspecified injury of left foot, initial encounter: Secondary | ICD-10-CM

## 2021-12-15 LAB — BASIC METABOLIC PANEL
Anion gap: 7 (ref 5–15)
BUN: 16 mg/dL (ref 6–20)
CO2: 25 mmol/L (ref 22–32)
Calcium: 8.7 mg/dL — ABNORMAL LOW (ref 8.9–10.3)
Chloride: 103 mmol/L (ref 98–111)
Creatinine, Ser: 1.13 mg/dL (ref 0.61–1.24)
GFR, Estimated: >60 mL/min (ref >60)
Glucose, Bld: 362 mg/dL — ABNORMAL HIGH (ref 70–99)
Potassium: 4.1 mmol/L (ref 3.5–5.1)
Sodium: 135 mmol/L (ref 135–145)

## 2021-12-15 LAB — CBC
HCT: 33.8 % — ABNORMAL LOW (ref 39.0–52.0)
Hemoglobin: 10.9 g/dL — ABNORMAL LOW (ref 13.0–17.0)
MCH: 26.7 pg (ref 26.0–34.0)
MCHC: 32.2 g/dL (ref 30.0–36.0)
MCV: 82.6 fL (ref 80.0–100.0)
Platelets: 276 10*3/uL (ref 150–400)
RBC: 4.09 MIL/uL — ABNORMAL LOW (ref 4.22–5.81)
RDW: 13.7 % (ref 11.5–15.5)
WBC: 9.5 10*3/uL (ref 4.0–10.5)
nRBC: 0 % (ref 0.0–0.2)

## 2021-12-15 LAB — GLUCOSE, CAPILLARY: Glucose-Capillary: 273 mg/dL — ABNORMAL HIGH (ref 70–99)

## 2021-12-15 MED ORDER — HUMULIN 70/30 KWIKPEN (70-30) 100 UNIT/ML ~~LOC~~ SUPN
10.0000 [IU] | PEN_INJECTOR | Freq: Two times a day (BID) | SUBCUTANEOUS | 0 refills | Status: DC
  Filled 2021-12-15: qty 6, 30d supply, fill #0

## 2021-12-15 MED ORDER — AMOXICILLIN-POT CLAVULANATE 875-125 MG PO TABS
1.0000 | ORAL_TABLET | Freq: Two times a day (BID) | ORAL | Status: DC
  Administered 2021-12-15: 1 via ORAL
  Filled 2021-12-15: qty 1

## 2021-12-15 MED ORDER — AMOXICILLIN-POT CLAVULANATE 875-125 MG PO TABS
1.0000 | ORAL_TABLET | Freq: Two times a day (BID) | ORAL | 0 refills | Status: AC
  Filled 2021-12-15: qty 27, 14d supply, fill #0

## 2021-12-15 MED ORDER — INSULIN PEN NEEDLE 32G X 4 MM MISC
0 refills | Status: DC
  Filled 2021-12-15: qty 100, 25d supply, fill #0

## 2021-12-15 MED ORDER — METFORMIN HCL 500 MG PO TABS: 500.0000 mg | ORAL_TABLET | Freq: Two times a day (BID) | ORAL | 0 refills | Status: DC

## 2021-12-15 NOTE — Discharge Instructions (Addendum)
It was nice to meet you! Thank you for letting us care for you. Here is what we talked about: We are switching your antibiotic to augmentin for your foot infection. Take this antibiotic for 2 weeks. Be sure to take the entire bottle of medication. To prevent further ulcers and infections, be sure to check your feet daily. You also qualify for special shoes to prevent these ulcers from occurring, and this will be talked about at your follow up visit with [facility] for January 06, 2022 at 4:10 pm. We are continuing your metformin and 70/30 insulin to help control your diabetes, and they will be provided to you through our [place]. Please take these medications as prescribed. We will follow up about your diabetes at your next visit.

## 2021-12-15 NOTE — Hospital Course (Addendum)
Left Diabetic Wound Meeting SIRs Criteria Patient was admitted with left lateral foot wound. WBC was 11.6. LA, CRP, and ESR were mildly elevated. Blood cultures were negative. XR was nonrevealing for osteomyelitis but did show 2 mm foreign body that was removed in the ED. MRI also did not reveal osteomyelitis but did reveal findings that could be due to abscess to myositis. He empirically received one dose of cefepime and vancomycin for osteomyelitis before MRI. He was continued on Flagyl for anaerobic coverage which was switched to Augmentin 875-125 mg BID x2 weeks at discharge. He remained afebrile, asymptomatic, and active on the floor throughout admission.  Elevated BP Patient's BP was elevated in the ED. He was largely normotensive throughout admission, with one BP 150/85 before discharge. He is not on medications for blood pressure.   T2DM Patient's Hgb A1c was 12.0 on admission. CBGs ranged 248-471 over admission. Lipid panel showed low HDL. Patient was prescribed metformin 500 mg BID and Novolog 70/30 10u BID at home, but he was not able to take the medications due to financial reasons. He was given Semglee 5u daily and Novolog SSI prn in the hospital. DM educator was consulted. At discharge, he was restarted on metformin 500 mg daily to reduce GI side effects and given Humulin 70/30 10u BID with meals through [employer] to reduce financial barriers. He was also counseled on checking his feet every day and following up with [facility] about shoes for those with diabetes.   Cocaine use Patient reported the use of cocaine at a party. He remained normotensive with a normal HR over admission. LA and CK were normal. UDS was positive for cocaine. Patient was counseled on use and availability of resources should he want them in the future, and he expressed understanding.

## 2021-12-15 NOTE — Progress Notes (Signed)
Discharge instructions reviewed with pt.  Copy of instructions and filled scripts given to pt.  Pt d/c'd via wheelchair with belongings, with family waiting at main entrance per pt states.          Escorted by unit NT.

## 2021-12-15 NOTE — TOC Progression Note (Addendum)
Transition of Care [facility]) - Progression Note    Patient Details  Name: [name] MRN: [id number] Date of Birth: 02/15/1966  Transition of [facility] CM/SW Contact  Nadene Rubins Adria Devon, RN Phone Number: 12/15/2021, 8:39 AM  Clinical Narrative:     Patient lives by himself , however his brother and nephew are close by. He does not have a PCP since moving back to [place] . Consult for PCP needs . PAtient in agreement for [facility]. Scheduled follow up appointment at [facility] for January 06, 2022 at 4:10 pm .   Changed pharmacy to [facility] . At discharge [facility] will call patient with cost of medications.   Financial counselor consult.  Patient voiced understanding to all of above.     Transition of Care [facility]) Screening Note   Patient Details  Name: [name] Date of Birth: 07-05-1966    Transition of Care Department [facility]) has reviewed patient and no TOC needs have been identified at this time. We will continue to monitor patient advancement through interdisciplinary progression rounds. If new patient transition needs arise, please place a TOC consult.        Expected Discharge Plan and Services                                                 Social Determinants of Health (SDOH) Interventions    Readmission Risk Interventions No flowsheet data found.

## 2021-12-15 NOTE — Discharge Summary (Signed)
[facility] Discharge Summary  Patient name: [name] Medical record number: [id number] Date of birth: 1966/11/29 Age: 56 y.o. Gender: male Date of Admission: 12/14/2021  Date of Discharge: 12/15/2021 Admitting Physician: Zenia Resides, MD  Primary Care Provider: Patient, No Pcp Per (Inactive): Appointment set up with [place] as PCP Consultants: None  Indication for Hospitalization: Left diabetic foot ulcer c/f osteomyelitis  Discharge Diagnoses/Problem List:  Foot ulcer secondary to diabetes T2DM Elevated BP GERD Cocaine use  Disposition: Home  Discharge Condition: Stable  Discharge Exam:  GEN: Well developed, well-nourished, in NAD HEAD: NCAT CVS: RRR, normal S1/S2, no murmurs, rubs, gallops, 2+ radial and DP pulses  RESP: Breathing comfortably on RA, no retractions, wheezes, rhonchi, or crackles ABD: Non-distended SKIN: 1.5 cm healing ulcer on lateral sole of the left foot without discharge or tenderness EXT: Moves all extremities equally    Brief Hospital Course:   Left Diabetic Wound Meeting SIRs Criteria Patient was admitted with left lateral foot wound. WBC was 11.6. LA, CRP, and ESR were mildly elevated. Blood cultures were negative. XR was nonrevealing for osteomyelitis but did show 2 mm foreign body that was removed in the ED. MRI also did not reveal osteomyelitis but did reveal findings that could be due to abscess to myositis. He empirically received one dose of cefepime and vancomycin for osteomyelitis before MRI. He was continued on Flagyl for anaerobic coverage which was switched to Augmentin 875-125 mg BID x2 weeks at discharge. He remained afebrile, asymptomatic, and active on the floor throughout admission.  Elevated BP Patient's BP was elevated in the ED. He was largely normotensive throughout admission, with one BP 150/85 before discharge. He is not on medications for blood pressure.   T2DM Patient's  Hgb A1c was 12.0 on admission. CBGs ranged 248-471 over admission. Lipid panel showed low HDL. Patient was prescribed metformin 500 mg BID and Novolog 70/30 10u BID at home, but he was not able to take the medications due to financial reasons. He was given Semglee 5u daily and Novolog SSI prn in the hospital. DM educator was consulted. At discharge, he was restarted on metformin 500 mg daily to reduce GI side effects and given Humulin 70/30 10u BID with meals through [employer] to reduce financial barriers. He was also counseled on checking his feet every day and following up with [facility] about shoes for those with diabetes.   Cocaine use Patient reported the use of cocaine at a party. He remained normotensive with a normal HR over admission. LA and CK were normal. UDS was positive for cocaine. Patient was counseled on use and availability of resources should he want them in the future, and he expressed understanding.  Issues for Follow Up:  Toleration of antibiotics and restarting metformin Needs podiatry follow up. Further improvement of ulcer and daily feet care BP monitoring Glucose monitoring and diabetes medication management, affordability Substance use and desire for resources  Significant Procedures: ABIs  Significant Labs and Imaging:  Recent Labs  Lab 12/14/21 0452 12/15/21 0156  WBC 11.6* 9.5  HGB 12.3* 10.9*  HCT 39.7 33.8*  PLT 344 276   Recent Labs  Lab 12/14/21 0452 12/14/21 1253 12/15/21 0156  NA 134* 133* 135  K 3.7 4.0 4.1  CL 101 100 103  CO2 24 24 25   GLUCOSE 325* 480* 362*  BUN 21* 22* 16  CREATININE 1.35* 1.38* 1.13  CALCIUM 9.6 8.7* 8.7*  ALKPHOS 83  --   --  AST 13*  --   --   ALT 15  --   --   ALBUMIN 3.8  --   --    XR Left Foot IMPRESSION: Lateral forefoot wound with 2 mm foreign body. No visible osseous infection.   MRI Left Foot wo Contrast IMPRESSION: 1. No evidence of acute osteomyelitis of the left forefoot. 2.  Shallow plantar ulceration underlying the fifth metatarsal head. Thin elongated fluid collection within the subcutaneous fat underlying the fifth metatarsal head is more suggestive of a adventitial bursa, although abscess not excluded given the proximity to the ulceration. 3. Skin thickening with additional superficial wound or ulceration along the more lateral aspect of the fifth toe. 4. Diffuse edema-like signal throughout the foot musculature may represent a combination of myositis and denervation changes.  Preliminary ABIs Summary:  Right: Resting right ankle-brachial index is within normal range. No  evidence of significant right lower extremity arterial disease. The right  toe-brachial index is normal.  Left: Resting left ankle-brachial index is within normal range. No  evidence of significant left lower extremity arterial disease. The left  toe-brachial index is normal.  Results/Tests Pending at Time of Discharge: Final ABIs  Discharge Medications:  Allergies as of 12/15/2021   No Known Allergies      Medication List     STOP taking these medications    cyclobenzaprine 5 MG tablet Commonly known as: FLEXERIL   insulin aspart protamine- aspart (70-30) 100 UNIT/ML injection Commonly known as: NovoLOG Mix 70/30 Replaced by: HumuLIN 70/30 KwikPen (70-30) 100 UNIT/ML KwikPen   pantoprazole 40 MG tablet Commonly known as: Protonix       TAKE these medications    amoxicillin-clavulanate 875-125 MG tablet Commonly known as: AUGMENTIN Take 1 tablet by mouth every 12 (twelve) hours for 27 doses.   famotidine 20 MG tablet Commonly known as: PEPCID Take 1 tablet (20 mg total) by mouth 2 (two) times daily. What changed:  when to take this reasons to take this   HumuLIN 70/30 KwikPen (70-30) 100 UNIT/ML KwikPen Generic drug: insulin isophane & regular human KwikPen Inject 10 Units into the skin 2 (two) times daily with a meal. Replaces: insulin aspart protamine-  aspart (70-30) 100 UNIT/ML injection   metFORMIN 500 MG tablet Commonly known as: GLUCOPHAGE Take 1 tablet (500 mg total) by mouth 2 (two) times daily.   Pentips 32G X 4 MM Misc Generic drug: Insulin Pen Needle Use as directed to inject insulin up to 4 times daily.       Discharge Instructions: Please refer to Patient Instructions section of EMR for full details. Patient was counseled important signs and symptoms that should prompt return to medical care, changes in medications, dietary instructions, activity restrictions, and follow up appointments.   Follow-Up Appointments:  Follow-up [facility] Follow up.   Why: appointment January 06, 2022 at 4:10 pm Contact information: [address] [place]               Signed: Mikal Plane, Medical Student 12/15/2021, 2:20 PM [place]  I was personally present and re-performed the exam and MDM and verified the service and findings are accurately documented in the student's note. Daison Braxton Autry-Lott, DO 12/15/21 2:57 PM

## 2021-12-15 NOTE — Progress Notes (Signed)
VASCULAR LAB    ABIs have been performed.  See CV proc for preliminary results.   Kellis Topete, RVT 12/15/2021, 12:30 PM

## 2021-12-15 NOTE — Evaluation (Signed)
Occupational Therapy Evaluation Patient Details Name: [name] MRN: [id number] DOB: [birth year] Today's Date: 12/15/2021   History of Present Illness Pt is a 56 year old man admitted on 12/14/21 with L diabetic foot ulcer, negative for osteomyelitis. PMH: DM2, HTN, substance abuse.   Clinical Impression   Pt is functioning independently. Reinforced importance of managing DM, checking feet, wearing shoes. No further OT needs.      Recommendations for follow up therapy are one component of a multi-disciplinary discharge planning process, led by the attending physician.  Recommendations may be updated based on patient status, additional functional criteria and insurance authorization.   Follow Up Recommendations  No OT follow up    Assistance Recommended at Discharge    Patient can return home with the following      Functional Status Assessment     Equipment Recommendations  None recommended by OT    Recommendations for Other Services       Precautions / Restrictions Precautions Precautions: None Restrictions Weight Bearing Restrictions: No      Mobility Bed Mobility               General bed mobility comments: in chair    Transfers Overall transfer level: Independent Equipment used: None                      Balance Overall balance assessment: Independent                                         ADL either performed or assessed with clinical judgement   ADL Overall ADL's : Independent                                             Vision Baseline Vision/History: 0 No visual deficits       Perception     Praxis      Pertinent Vitals/Pain Pain Assessment: No/denies pain     Hand Dominance Right   Extremity/Trunk Assessment Upper Extremity Assessment Upper Extremity Assessment: Overall WFL for tasks assessed   Lower Extremity Assessment Lower Extremity Assessment: Overall WFL for tasks assessed    Cervical / Trunk Assessment Cervical / Trunk Assessment: Normal   Communication Communication Communication: No difficulties   Cognition Arousal/Alertness: Awake/alert Behavior During Therapy: WFL for tasks assessed/performed Overall Cognitive Status: Within Functional Limits for tasks assessed                                       General Comments       Exercises     Shoulder Instructions      Home Living Family/patient expects to be discharged to:: Private residence Living Arrangements: Alone Available Help at Discharge: Family;Available PRN/intermittently;Friend(s) Type of Home: House Home Access: Stairs to enter [employer] of Steps: 4 Entrance Stairs-Rails: Right;Left Home Layout: One level     Bathroom Shower/Tub: Teacher, early years/pre: Standard     Home Equipment: None          Prior Functioning/Environment Prior Level of Function : Independent/Modified Independent  OT Problem List:        OT Treatment/Interventions:      OT Goals(Current goals can be found in the care plan section)    OT Frequency:      Co-evaluation              AM-PAC OT "6 Clicks" Daily Activity     Outcome Measure Help from another person eating meals?: None Help from another person taking care of personal grooming?: None Help from another person toileting, which includes using toliet, bedpan, or urinal?: None Help from another person bathing (including washing, rinsing, drying)?: None Help from another person to put on and taking off regular upper body clothing?: None Help from another person to put on and taking off regular lower body clothing?: None 6 Click Score: 24   End of Session    Activity Tolerance: Patient tolerated treatment well Patient left: in chair;with call bell/phone within reach;with family/visitor present  OT Visit Diagnosis: Muscle weakness (generalized) (M62.81)                 Time: [id number] OT Time Calculation (min): 17 min Charges:  OT General Charges $OT Visit: 1 Visit OT Evaluation $OT Eval Low Complexity: 1 Low  Nestor Lewandowsky, OTR/L Acute Rehabilitation Services Pager: [phone] Office: [phone]   [name] 12/15/2021, 9:59 AM

## 2021-12-15 NOTE — Progress Notes (Signed)
Inpatient Diabetes Program Recommendations  AACE/ADA: New Consensus Statement on Inpatient Glycemic Control (2015)  Target Ranges:  Prepandial:   less than 140 mg/dL      Peak postprandial:   less than 180 mg/dL (1-2 hours)      Critically ill patients:  140 - 180 mg/dL   Lab Results  Component Value Date   GLUCAP 273 (H) 12/15/2021   HGBA1C 12.0 (H) 12/14/2021    Review of Glycemic Control  Diabetes history: type 2 Outpatient Diabetes medications: 70/30 10 units BID, Metformin 500 mg BID Current orders for Inpatient glycemic control: Semglee 5 units at HS, Novolog SENSITIVE correction scale TID    Inpatient Diabetes Program Recommendations:   Noted that blood sugars have been greater than 180 mg/dl.   Recommend increasing Semglee to 10 units at HS if blood sugars continue to be elevated.   Smith Mince RN BSN CDE Diabetes Coordinator Pager: [phone]  8am-5pm

## 2021-12-15 NOTE — TOC Benefit Eligibility Note (Signed)
Patient Product/process development scientist completed.    The patient is currently admitted and upon discharge could be taking Basaglar Kwik Pen.  The current 30 day co-pay is, $353.34.   The patient is currently admitted and upon discharge could be taking Lantus Solostar Pen.  Non Formulary   The patient is insured through [employer]     Roland Earl, CPhT Pharmacy Patient Advocate Specialist [facility] Patient Advocate Team Direct Number: [phone]  Fax: [phone]

## 2021-12-19 LAB — CULTURE, BLOOD (ROUTINE X 2)
Culture: NO GROWTH
Culture: NO GROWTH
Special Requests: ADEQUATE
Special Requests: ADEQUATE

## 2022-01-06 ENCOUNTER — Ambulatory Visit: Payer: Self-pay | Attending: Nurse Practitioner | Admitting: Nurse Practitioner

## 2022-05-04 ENCOUNTER — Inpatient Hospital Stay (HOSPITAL_COMMUNITY): Payer: Self-pay

## 2022-05-04 ENCOUNTER — Encounter (HOSPITAL_COMMUNITY): Payer: Self-pay

## 2022-05-04 ENCOUNTER — Inpatient Hospital Stay (HOSPITAL_COMMUNITY)
Admission: EM | Admit: 2022-05-04 | Discharge: 2022-05-11 | DRG: 853 | Disposition: A | Discharge status: Home Health | Payer: Self-pay | Attending: Internal Medicine | Admitting: Internal Medicine

## 2022-05-04 ENCOUNTER — Other Ambulatory Visit: Payer: Self-pay

## 2022-05-04 ENCOUNTER — Emergency Department (HOSPITAL_COMMUNITY): Payer: Self-pay

## 2022-05-04 DIAGNOSIS — L02612 Cutaneous abscess of left foot: Secondary | ICD-10-CM | POA: Diagnosis present

## 2022-05-04 DIAGNOSIS — L97528 Non-pressure chronic ulcer of other part of left foot with other specified severity: Secondary | ICD-10-CM

## 2022-05-04 DIAGNOSIS — F17210 Nicotine dependence, cigarettes, uncomplicated: Secondary | ICD-10-CM | POA: Diagnosis present

## 2022-05-04 DIAGNOSIS — Z794 Long term (current) use of insulin: Secondary | ICD-10-CM

## 2022-05-04 DIAGNOSIS — B9562 Methicillin resistant Staphylococcus aureus infection as the cause of diseases classified elsewhere: Secondary | ICD-10-CM | POA: Diagnosis present

## 2022-05-04 DIAGNOSIS — L97529 Non-pressure chronic ulcer of other part of left foot with unspecified severity: Secondary | ICD-10-CM | POA: Diagnosis present

## 2022-05-04 DIAGNOSIS — B954 Other streptococcus as the cause of diseases classified elsewhere: Secondary | ICD-10-CM | POA: Diagnosis present

## 2022-05-04 DIAGNOSIS — E669 Obesity, unspecified: Secondary | ICD-10-CM

## 2022-05-04 DIAGNOSIS — Z79899 Other long term (current) drug therapy: Secondary | ICD-10-CM

## 2022-05-04 DIAGNOSIS — B961 Klebsiella pneumoniae [K. pneumoniae] as the cause of diseases classified elsewhere: Secondary | ICD-10-CM | POA: Diagnosis present

## 2022-05-04 DIAGNOSIS — E119 Type 2 diabetes mellitus without complications: Secondary | ICD-10-CM

## 2022-05-04 DIAGNOSIS — Z720 Tobacco use: Secondary | ICD-10-CM

## 2022-05-04 DIAGNOSIS — A419 Sepsis, unspecified organism: Principal | ICD-10-CM | POA: Diagnosis present

## 2022-05-04 DIAGNOSIS — Z6834 Body mass index (BMI) 34.0-34.9, adult: Secondary | ICD-10-CM

## 2022-05-04 DIAGNOSIS — Z7984 Long term (current) use of oral hypoglycemic drugs: Secondary | ICD-10-CM

## 2022-05-04 DIAGNOSIS — L03119 Cellulitis of unspecified part of limb: Principal | ICD-10-CM

## 2022-05-04 DIAGNOSIS — E875 Hyperkalemia: Secondary | ICD-10-CM

## 2022-05-04 DIAGNOSIS — E1152 Type 2 diabetes mellitus with diabetic peripheral angiopathy with gangrene: Secondary | ICD-10-CM | POA: Diagnosis present

## 2022-05-04 DIAGNOSIS — L02619 Cutaneous abscess of unspecified foot: Principal | ICD-10-CM

## 2022-05-04 DIAGNOSIS — A480 Gas gangrene: Secondary | ICD-10-CM | POA: Diagnosis present

## 2022-05-04 DIAGNOSIS — E1169 Type 2 diabetes mellitus with other specified complication: Secondary | ICD-10-CM | POA: Diagnosis present

## 2022-05-04 DIAGNOSIS — L03116 Cellulitis of left lower limb: Secondary | ICD-10-CM | POA: Diagnosis present

## 2022-05-04 DIAGNOSIS — M868X7 Other osteomyelitis, ankle and foot: Secondary | ICD-10-CM | POA: Diagnosis present

## 2022-05-04 DIAGNOSIS — E11621 Type 2 diabetes mellitus with foot ulcer: Secondary | ICD-10-CM | POA: Diagnosis present

## 2022-05-04 DIAGNOSIS — N179 Acute kidney failure, unspecified: Secondary | ICD-10-CM

## 2022-05-04 LAB — CBG MONITORING, ED: Glucose-Capillary: 462 mg/dL — ABNORMAL HIGH (ref 70–99)

## 2022-05-04 LAB — CBC
HCT: 35.9 % — ABNORMAL LOW (ref 39.0–52.0)
Hemoglobin: 11.4 g/dL — ABNORMAL LOW (ref 13.0–17.0)
MCH: 26.8 pg (ref 26.0–34.0)
MCHC: 31.8 g/dL (ref 30.0–36.0)
MCV: 84.5 fL (ref 80.0–100.0)
Platelets: 304 10*3/uL (ref 150–400)
RBC: 4.25 MIL/uL (ref 4.22–5.81)
RDW: 13.7 % (ref 11.5–15.5)
WBC: 19.4 10*3/uL — ABNORMAL HIGH (ref 4.0–10.5)
nRBC: 0 % (ref 0.0–0.2)

## 2022-05-04 LAB — BASIC METABOLIC PANEL
Anion gap: 9 (ref 5–15)
BUN: 21 mg/dL — ABNORMAL HIGH (ref 6–20)
CO2: 26 mmol/L (ref 22–32)
Calcium: 8.5 mg/dL — ABNORMAL LOW (ref 8.9–10.3)
Chloride: 96 mmol/L — ABNORMAL LOW (ref 98–111)
Creatinine, Ser: 1.67 mg/dL — ABNORMAL HIGH (ref 0.61–1.24)
GFR, Estimated: 48 mL/min — ABNORMAL LOW (ref >60)
Glucose, Bld: 455 mg/dL — ABNORMAL HIGH (ref 70–99)
Potassium: 4.1 mmol/L (ref 3.5–5.1)
Sodium: 131 mmol/L — ABNORMAL LOW (ref 135–145)

## 2022-05-04 LAB — SEDIMENTATION RATE: Sed Rate: 65 mm/hr — ABNORMAL HIGH (ref 0–16)

## 2022-05-04 LAB — LACTIC ACID, PLASMA
Lactic Acid, Venous: 1.6 mmol/L (ref 0.5–1.9)
Lactic Acid, Venous: 1.2 mmol/L (ref 0.5–1.9)

## 2022-05-04 LAB — GLUCOSE, CAPILLARY: Glucose-Capillary: 504 mg/dL (ref 70–99)

## 2022-05-04 LAB — C-REACTIVE PROTEIN: CRP: 27.3 mg/dL — ABNORMAL HIGH (ref <1.0)

## 2022-05-04 MED ORDER — ONDANSETRON HCL 4 MG PO TABS: 4.0000 mg | ORAL_TABLET | Freq: Four times a day (QID) | ORAL | Status: DC | PRN

## 2022-05-04 MED ORDER — VANCOMYCIN HCL 2000 MG/400ML IV SOLN
2000.0000 mg | Freq: Once | INTRAVENOUS | Status: AC
  Administered 2022-05-04: 2000 mg via INTRAVENOUS
  Filled 2022-05-04: qty 400

## 2022-05-04 MED ORDER — CHLORHEXIDINE GLUCONATE 4 % EX LIQD
60.0000 mL | Freq: Once | CUTANEOUS | Status: AC
  Administered 2022-05-05: 4 via TOPICAL
  Filled 2022-05-04: qty 60

## 2022-05-04 MED ORDER — POVIDONE-IODINE 10 % EX SWAB: 2.0000 "application " | Freq: Once | CUTANEOUS | Status: DC

## 2022-05-04 MED ORDER — ONDANSETRON HCL 4 MG/2ML IJ SOLN: 4.0000 mg | Freq: Four times a day (QID) | INTRAMUSCULAR | Status: DC | PRN

## 2022-05-04 MED ORDER — SODIUM CHLORIDE 0.9 % IV SOLN
INTRAVENOUS | Status: AC
Start: 2022-05-04 — End: 2022-05-05

## 2022-05-04 MED ORDER — ACETAMINOPHEN 325 MG PO TABS
650.0000 mg | ORAL_TABLET | Freq: Four times a day (QID) | ORAL | Status: DC | PRN
  Administered 2022-05-05 – 2022-05-07 (×3): 650 mg via ORAL
  Filled 2022-05-04 (×3): qty 2

## 2022-05-04 MED ORDER — BISACODYL 10 MG RE SUPP: 10.0000 mg | Freq: Every day | RECTAL | Status: DC | PRN

## 2022-05-04 MED ORDER — ONDANSETRON HCL 4 MG/2ML IJ SOLN: 4.0000 mg | Freq: Once | INTRAMUSCULAR | Status: DC | PRN

## 2022-05-04 MED ORDER — MORPHINE SULFATE (PF) 4 MG/ML IV SOLN
4.0000 mg | Freq: Once | INTRAVENOUS | Status: AC
  Administered 2022-05-04: 4 mg via INTRAVENOUS
  Filled 2022-05-04: qty 1

## 2022-05-04 MED ORDER — LACTATED RINGERS IV SOLN: INTRAVENOUS | Status: DC

## 2022-05-04 MED ORDER — INSULIN ASPART 100 UNIT/ML IJ SOLN
0.0000 [IU] | Freq: Three times a day (TID) | INTRAMUSCULAR | Status: DC
  Administered 2022-05-05: 15 [IU] via SUBCUTANEOUS
  Administered 2022-05-06: 20 [IU] via SUBCUTANEOUS

## 2022-05-04 MED ORDER — BACID PO TABS
2.0000 | ORAL_TABLET | Freq: Three times a day (TID) | ORAL | Status: DC
Start: 2022-05-04 — End: 2022-05-04

## 2022-05-04 MED ORDER — VANCOMYCIN HCL 1250 MG/250ML IV SOLN
1250.0000 mg | INTRAVENOUS | Status: DC
  Administered 2022-05-05 – 2022-05-06 (×2): 1250 mg via INTRAVENOUS
  Filled 2022-05-04 (×2): qty 250

## 2022-05-04 MED ORDER — ENOXAPARIN SODIUM 40 MG/0.4ML IJ SOSY
40.0000 mg | PREFILLED_SYRINGE | INTRAMUSCULAR | Status: DC
  Administered 2022-05-04: 40 mg via SUBCUTANEOUS
  Filled 2022-05-04: qty 0.4

## 2022-05-04 MED ORDER — PIPERACILLIN-TAZOBACTAM 3.375 G IVPB
3.3750 g | Freq: Three times a day (TID) | INTRAVENOUS | Status: DC
  Administered 2022-05-05 – 2022-05-06 (×5): 3.375 g via INTRAVENOUS
  Filled 2022-05-04 (×5): qty 50

## 2022-05-04 MED ORDER — CEFAZOLIN SODIUM-DEXTROSE 2-4 GM/100ML-% IV SOLN
2.0000 g | INTRAVENOUS | Status: AC
  Administered 2022-05-05: 2 g via INTRAVENOUS
  Filled 2022-05-04: qty 100

## 2022-05-04 MED ORDER — RISAQUAD PO CAPS
2.0000 | ORAL_CAPSULE | Freq: Every day | ORAL | Status: DC
  Administered 2022-05-04 – 2022-05-11 (×7): 2 via ORAL
  Filled 2022-05-04 (×7): qty 2

## 2022-05-04 MED ORDER — OXYCODONE HCL 5 MG PO TABS: 5.0000 mg | ORAL_TABLET | ORAL | Status: DC | PRN

## 2022-05-04 MED ORDER — INSULIN ASPART 100 UNIT/ML IJ SOLN
15.0000 [IU] | Freq: Once | INTRAMUSCULAR | Status: AC
Start: 2022-05-05 — End: 2022-05-05
  Administered 2022-05-05: 15 [IU] via SUBCUTANEOUS

## 2022-05-04 MED ORDER — SODIUM CHLORIDE 0.9 % IV SOLN
2.0000 g | Freq: Three times a day (TID) | INTRAVENOUS | Status: DC
  Administered 2022-05-04: 2 g via INTRAVENOUS
  Filled 2022-05-04: qty 12.5

## 2022-05-04 MED ORDER — HYDROMORPHONE HCL 1 MG/ML IJ SOLN
0.5000 mg | INTRAMUSCULAR | Status: DC | PRN
  Administered 2022-05-04 – 2022-05-05 (×3): 1 mg via INTRAVENOUS
  Filled 2022-05-04 (×3): qty 1

## 2022-05-04 NOTE — Progress Notes (Signed)
Pharmacy Antibiotic Note  [name] is a 56 y.o. male for which pharmacy has been consulted for zosyn and vancomycin dosing for  DFI .  Patient with a history of  left foot diabetic ulcer, IDDM. Patient presenting with left foot pain and pus coming out.  SCr 1.67 - above baseline WBC 19.4; LA 1.2; T 99.1 F; CRP 27.3  Plan: Zosyn 3.375g IV q8h (4 hour infusion) Vancomycin 2000 mg once then 1250 mg q24hr (eAUC 511.1) unless change in renal function - SCr is above baseline so may require adjustment in AM Trend WBC, Fever, Renal function, & Clinical course F/u cultures, clinical course, WBC, fever De-escalate when able  Height: 5\' 9"  (175.3 cm) Weight: 106.6 kg (235 lb) IBW/kg (Calculated) : 70.7  Temp (24hrs), Avg:99.1 F (37.3 C), Min:99.1 F (37.3 C), Max:99.1 F (37.3 C)  Recent Labs  Lab 05/04/22 1346  WBC 19.4*  CREATININE 1.67*    Estimated Creatinine Clearance: 60.2 mL/min (A) (by C-G formula based on SCr of 1.67 mg/dL (H)).    No Known Allergies  Antimicrobials this admission: Cefepime x 1 in ED vancomycin 6/1 >>  zosyn 6/1 >>   Microbiology results: Pending  Thank you for allowing pharmacy to be a part of this patient's care.  8/1, PharmD, BCPS 05/04/2022 2:50 PM ED Clinical Pharmacist -  [phone]

## 2022-05-04 NOTE — H&P (Signed)
History and Physical    [name] ZOX:[id number] DOB: 1965-12-14 DOA: 05/04/2022  PCP: Patient, No Pcp Per (Inactive) (Confirm with patient/family/NH records and if not entered, [facility]) Patient coming from: Home  I have personally briefly reviewed patient[facility]  Chief Complaint: left foot pain and pus coming out  HPI: [name] is a 56 y.o. male with medical history significant of left foot diabetic ulcer, IDDM, came with left foot pain and abscess.  Patient had a chronic left-sided diabetic foot ulcer, initially infected in January, MRI at that point showed only soft tissue infection and patient received treatment of p.o. antibiotics.  Symptoms has been stable, no pain or enlargement of the wound until about 1 week ago, patient ran out of his insulins and started to have left-sided foot pain.  The pain was 2-3/10, but last 2 days has become unbearable.  Last night he had to take a total of 200 mg x 8 of ibuprofen and 4 Tylenol to control the pain.  Denies any fever.  For last 2 days, he also noticed thick pus coming out of the wound associated with full smell.  No fever or chills.  ED Course: Patient was found tachycardia blood pressure borderline but afebrile.  Elevated WBC count, creatinine 1.6 compared to baseline 1.1-1.3.  X-ray compatible with left lateral forefoot soft tissue ulceration suspicious for foreign body with extensive subcu cutaneous emphysema.  Vancomycin and cefepime started in the ED.  Review of Systems: As per HPI otherwise 14 point review of systems negative.    Past Medical History:  Diagnosis Date   Diabetes mellitus without complication (HCC)     Past Surgical History:  Procedure Laterality Date   TONSILLECTOMY       reports that he has been smoking. He has been smoking an average of 1 pack per day. He has never used smokeless tobacco. He reports that he does not drink alcohol and  does not use drugs.  No Known Allergies  History reviewed. No pertinent family history.   Prior to Admission medications   Medication Sig Start Date End Date Taking? Authorizing Provider  acetaminophen (TYLENOL) 500 MG tablet Take 2,000 mg by mouth every 6 (six) hours as needed for mild pain or moderate pain.   Yes [provider]  ibuprofen (ADVIL) 200 MG tablet Take 400-800 mg by mouth daily as needed for mild pain or moderate pain.   Yes [provider]  insulin isophane & regular human KwikPen (HUMULIN 70/30 KWIKPEN) (70-30) 100 UNIT/ML KwikPen Inject 10 Units into the skin 2 (two) times daily with a meal. 12/15/21 07/08/22 Yes Autry-Lott, Randa EvensSimone, DO  metFORMIN (GLUCOPHAGE) 500 MG tablet Take 1 tablet (500 mg total) by mouth 2 (two) times daily. 12/15/21  Yes Autry-Lott, Randa EvensSimone, DO  famotidine (PEPCID) 20 MG tablet Take 1 tablet (20 mg total) by mouth 2 (two) times daily. Patient not taking: Reported on 05/04/2022 12/29/19   Pricilla LovelessGoldston, Scott, MD  Insulin Pen Needle 32G X 4 MM MISC Use as directed to inject insulin up to 4 times daily. 12/15/21       Physical Exam: Vitals:   05/04/22 1331 05/04/22 1332 05/04/22 1445 05/04/22 1715  BP: (!) 107/96  125/61 120/75  Pulse: (!) 104  100 92  Resp: 16  (!) 21 16  Temp: 99.1 F (37.3 C)     TempSrc: Oral     SpO2: 100%  100% 97%  Weight:  106.6 kg    Height:  5\' 9"  (1.753 m)      Constitutional: NAD, calm, comfortable Vitals:   05/04/22 1331 05/04/22 1332 05/04/22 1445 05/04/22 1715  BP: (!) 107/96  125/61 120/75  Pulse: (!) 104  100 92  Resp: 16  (!) 21 16  Temp: 99.1 F (37.3 C)     TempSrc: Oral     SpO2: 100%  100% 97%  Weight:  106.6 kg    Height:  5\' 9"  (1.753 m)     Eyes: PERRL, lids and conjunctivae normal ENMT: Mucous membranes are moist. Posterior pharynx clear of any exudate or lesions.Normal dentition.  Neck: normal, supple, no masses, no thyromegaly Respiratory: clear to auscultation bilaterally, no  wheezing, no crackles. Normal respiratory effort. No accessory muscle use.  Cardiovascular: Regular rate and rhythm, no murmurs / rubs / gallops. No extremity edema. 2+ pedal pulses. No carotid bruits.  Abdomen: no tenderness, no masses palpated. No hepatosplenomegaly. Bowel sounds positive.  Musculoskeletal: no clubbing / cyanosis. No joint deformity upper and lower extremities. Good ROM, no contractures. Normal muscle tone.  Skin: Left plantar side of the midfoot swelling and ulcer with foul smelling Neurologic: CN 2-12 grossly intact. Sensation intact, DTR normal. Strength 5/5 in all 4.  Psychiatric: Normal judgment and insight. Alert and oriented x 3. Normal mood.      Labs on Admission: I have personally reviewed following labs and imaging studies  CBC: Recent Labs  Lab 05/04/22 1346  WBC 19.4*  HGB 11.4*  HCT 35.9*  MCV 84.5  PLT 304   Basic Metabolic Panel: Recent Labs  Lab 05/04/22 1346  NA 131*  K 4.1  CL 96*  CO2 26  GLUCOSE 455*  BUN 21*  CREATININE 1.67*  CALCIUM 8.5*   GFR: Estimated Creatinine Clearance: 60.2 mL/min (A) (by C-G formula based on SCr of 1.67 mg/dL (H)). Liver Function Tests: No results for input(s): AST, ALT, ALKPHOS, BILITOT, PROT, ALBUMIN in the last 168 hours. No results for input(s): LIPASE, AMYLASE in the last 168 hours. No results for input(s): AMMONIA in the last 168 hours. Coagulation Profile: No results for input(s): INR, PROTIME in the last 168 hours. Cardiac Enzymes: No results for input(s): CKTOTAL, CKMB, CKMBINDEX, TROPONINI in the last 168 hours. BNP (last 3 results) No results for input(s): PROBNP in the last 8760 hours. HbA1C: No results for input(s): HGBA1C in the last 72 hours. CBG: Recent Labs  Lab 05/04/22 1332  GLUCAP 462*   Lipid Profile: No results for input(s): CHOL, HDL, LDLCALC, TRIG, CHOLHDL, LDLDIRECT in the last 72 hours. Thyroid Function Tests: No results for input(s): TSH, T4TOTAL, FREET4, T3FREE,  THYROIDAB in the last 72 hours. Anemia Panel: No results for input(s): VITAMINB12, FOLATE, FERRITIN, TIBC, IRON, RETICCTPCT in the last 72 hours. Urine analysis:    Component Value Date/Time   COLORURINE YELLOW 12/14/2021 0417   APPEARANCEUR CLEAR 12/14/2021 0417   LABSPEC >1.030 (H) 12/14/2021 0417   PHURINE 5.5 12/14/2021 0417   GLUCOSEU >=500 (A) 12/14/2021 0417   HGBUR SMALL (A) 12/14/2021 0417   BILIRUBINUR NEGATIVE 12/14/2021 0417   KETONESUR NEGATIVE 12/14/2021 0417   PROTEINUR 30 (A) 12/14/2021 0417   UROBILINOGEN 0.2 11/10/2010 1558   NITRITE NEGATIVE 12/14/2021 0417   LEUKOCYTESUR NEGATIVE 12/14/2021 0417    Radiological Exams on Admission: DG Foot Complete Left  Result Date: 05/04/2022 CLINICAL DATA:  Pain in lateral foot EXAM: LEFT FOOT - COMPLETE 3+ VIEW COMPARISON:  12/14/2021 FINDINGS: Ulceration noted in the lateral forefoot. There are 2 small hyperdensities in the region of ulceration which may be foreign body or topical medication. Extensive subcutaneous emphysema of the palmar 4 and midfoot. Enthesopathic changes are seen at the insertion of the plantar fascia and Achilles tendon on the calcaneus. IMPRESSION: Lateral forefoot soft tissue ulceration containing small hyperdensities which may be foreign body or medication. Please correlate with examination. Extensive subcutaneous emphysema of the plantar mid and forefoot. No osseous erosions are seen to indicate osteomyelitis. Electronically Signed   By: Acquanetta Belling M.D.   On: 05/04/2022 14:09    EKG: None  Assessment/Plan Principal Problem:   Diabetic foot ulcer (HCC) Active Problems:   AKI (acute kidney injury) (HCC)  (please populate well all problems here in Problem List. (For example, if patient is on BP meds at home and you resume or decide to hold them, it is a problem that needs to be her. Same for CAD, COPD, HLD and so on)  SIRS -Secondary to left foot abscess and infected diabetic ulcer -Suspect early gas  gangrene -Change antibiotic coverage to vancomycin plus Zosyn instead of cefepime -OR tomorrow for I&D versus amputation  IDDM with hyperglycemia -Secondary to noncompliant with insulin -Patient claimed that he ran out of insulin and due to change of insurance, he does not have access to insulin right now.  Consult case manager for medication assistance. -Sliding scale for now  AKI -Probably secondary to excessive NSAIDs use of about 1600 mg of ibuprofen last night.  Aggressive IV hydration for now, recheck BMP tomorrow   DVT prophylaxis: Lovenox Code Status: Full code Family Communication: None at bedside Disposition Plan: Patient sick with left foot gas gangrene and deep tissue infection, expect more than 2 midnight hospital stay expect surgical intervention. Consults called: Podiatry/orthopedic surgery Admission status: MedSurg admission   Emeline General MD [facility] Pager [phone]  05/04/2022, 5:35 PM

## 2022-05-04 NOTE — Progress Notes (Signed)
Notified bedside nurse of need to draw lactic acid and repeat lactic acid. 

## 2022-05-04 NOTE — ED Triage Notes (Signed)
Pt arrived POV from home c/o left foot pain and a diabetic ulcer. Pt states the pain is 10/10. Pt endorses swelling and pain.

## 2022-05-04 NOTE — Progress Notes (Signed)
Notified provider of need to order lactic acid and repeat lactic acid.  

## 2022-05-04 NOTE — ED Provider Triage Note (Signed)
Emergency Medicine Provider Triage Evaluation Note  [name] , a 56 y.o. male  was evaluated in triage.  Pt complains of diabetic ulcer left foot.  Patient reports he was seen here about a month and a half ago, had wound debrided and was advised to follow-up with outpatient doctor.  Patient reports that he has been unable to follow-up with his outpatient doctor due to insurance issues.  Patient states pain in left foot is 13 out of 10.  Patient denies drainage.  Patient denies fevers, body aches or chills, nausea, vomiting, numbness or tingling in the foot.  Review of Systems  Positive:  Negative:   Physical Exam  BP (!) 107/96 (BP Location: Left Arm)   Pulse (!) 104   Temp 99.1 F (37.3 C) (Oral)   Resp 16   Ht 5\' 9"  (1.753 m)   Wt 106.6 kg   SpO2 100%   BMI 34.70 kg/m  Gen:   Awake, no distress   Resp:  Normal effort  MSK:   Moves extremities without difficulty  Other:  Diabetic ulcer noted to sole of patient left foot.  Medical Decision Making  Medically screening exam initiated at 1:40 PM.  Appropriate orders placed.  [name] was informed that the remainder of the evaluation will be completed by another provider, this initial triage assessment does not replace that evaluation, and the importance of remaining in the ED until their evaluation is complete.     [name], PA-C 05/04/22 1341

## 2022-05-04 NOTE — Consult Note (Signed)
Reason for Consult:Left foot diabetic ulcer Referring Physician: Thamas Jaegers Time called: [id number] Time at bedside: [name] is an 56 y.o. male.  HPI: [name] comes in with a week-long hx/o left foot pain. He was here in January with a similar story, treated with abx, and improved so never followed up. The ulcer, however, did not get better. He denies fevers, chills, sweats, N/V. He works as a IT sales professional.  Past Medical History:  Diagnosis Date   Diabetes mellitus without complication ([place])     Past Surgical History:  Procedure Laterality Date   TONSILLECTOMY      History reviewed. No pertinent family history.  Social History:  reports that he has been smoking. He has been smoking an average of 1 pack per day. He has never used smokeless tobacco. He reports that he does not drink alcohol and does not use drugs.  Allergies: No Known Allergies  Medications: I have reviewed the patient's current medications.  Results for orders placed or performed during the hospital encounter of 05/04/22 (from the past 48 hour(s))  CBG monitoring, ED     Status: Abnormal   Collection Time: 05/04/22  1:32 PM  Result Value Ref Range   Glucose-Capillary 462 (H) 70 - 99 mg/dL    Comment: Glucose reference range applies only to samples taken after fasting for at least 8 hours.  CBC     Status: Abnormal   Collection Time: 05/04/22  1:46 PM  Result Value Ref Range   WBC 19.4 (H) 4.0 - 10.5 K/uL   RBC 4.25 4.22 - 5.81 MIL/uL   Hemoglobin 11.4 (L) 13.0 - 17.0 g/dL   HCT 35.9 (L) 39.0 - 52.0 %   MCV 84.5 80.0 - 100.0 fL   MCH 26.8 26.0 - 34.0 pg   MCHC 31.8 30.0 - 36.0 g/dL   RDW 13.7 11.5 - 15.5 %   Platelets 304 150 - 400 K/uL   nRBC 0.0 0.0 - 0.2 %    Comment: Performed at [facility], [place] [address], [place], [place] [id number]  Basic metabolic panel     Status: Abnormal   Collection Time: 05/04/22  1:46 PM  Result Value Ref Range   Sodium 131 (L) 135 - 145 mmol/L   Potassium 4.1  3.5 - 5.1 mmol/L   Chloride 96 (L) 98 - 111 mmol/L   CO2 26 22 - 32 mmol/L   Glucose, Bld 455 (H) 70 - 99 mg/dL    Comment: Glucose reference range applies only to samples taken after fasting for at least 8 hours.   BUN 21 (H) 6 - 20 mg/dL   Creatinine, Ser 1.67 (H) 0.61 - 1.24 mg/dL   Calcium 8.5 (L) 8.9 - 10.3 mg/dL   GFR, Estimated 48 (L) >60 mL/min    Comment: (NOTE) Calculated using the CKD-EPI Creatinine Equation (2021)    Anion gap 9 5 - 15    Comment: Performed at [facility] [address], [place], [place] [address]  Lactic acid, plasma     Status: None   Collection Time: 05/04/22  3:06 PM  Result Value Ref Range   Lactic Acid, Venous 1.6 0.5 - 1.9 mmol/L    Comment: Performed at [facility] [address], [place], [place] [address]    DG Foot Complete Left  Result Date: 05/04/2022 CLINICAL DATA:  Pain in lateral foot EXAM: LEFT FOOT - COMPLETE 3+ VIEW COMPARISON:  12/14/2021 FINDINGS: Ulceration noted  in the lateral forefoot. There are 2 small hyperdensities in the region of ulceration which may be foreign body or topical medication. Extensive subcutaneous emphysema of the palmar 4 and midfoot. Enthesopathic changes are seen at the insertion of the plantar fascia and Achilles tendon on the calcaneus. IMPRESSION: Lateral forefoot soft tissue ulceration containing small hyperdensities which may be foreign body or medication. Please correlate with examination. Extensive subcutaneous emphysema of the plantar mid and forefoot. No osseous erosions are seen to indicate osteomyelitis. Electronically Signed   By: Miachel Roux M.D.   On: 05/04/2022 14:09    Review of Systems  Constitutional:  Negative for chills, diaphoresis and fever.  HENT:  Negative for ear discharge, ear pain, hearing loss and tinnitus.   Eyes:  Negative for photophobia and pain.  Respiratory:  Negative for cough and shortness of breath.   Cardiovascular:  Negative for chest pain.   Gastrointestinal:  Negative for abdominal pain, nausea and vomiting.  Genitourinary:  Negative for dysuria, flank pain, frequency and urgency.  Musculoskeletal:  Positive for arthralgias (Left foot). Negative for back pain, myalgias and neck pain.  Neurological:  Negative for dizziness and headaches.  Hematological:  Does not bruise/bleed easily.  Psychiatric/Behavioral:  The patient is not nervous/anxious.   Blood pressure 125/61, pulse 100, temperature 99.1 F (37.3 C), temperature source Oral, resp. rate (!) 21, height 5\' 9"  (1.753 m), weight 106.6 kg, SpO2 100 %. Physical Exam Constitutional:      General: He is not in acute distress.    Appearance: He is well-developed. He is not diaphoretic.  HENT:     Head: Normocephalic and atraumatic.  Eyes:     General: No scleral icterus.       Right eye: No discharge.        Left eye: No discharge.     Conjunctiva/sclera: Conjunctivae normal.  Cardiovascular:     Rate and Rhythm: Normal rate and regular rhythm.  Pulmonary:     Effort: Pulmonary effort is normal. No respiratory distress.  Musculoskeletal:     Cervical back: Normal range of motion.  Feet:     Comments: Left foot: Ulceration plantar 5th MTP joint, purulent discharge from recent plantar I&D midfoot, malodorous. Foot erythematous, mildly warm and edematous. SPN/DPN/TN intact, DP/PT 2+. Skin:    General: Skin is warm and dry.  Neurological:     Mental Status: He is alert.  Psychiatric:        Mood and Affect: Mood normal.        Behavior: Behavior normal.    Assessment/Plan: Left foot diabetic ulcer -- Likely needs BKA at this point but pt reluctant. Will get MRI to rule out osteo, may help with convincing. Medicine to admit. Will make NPO after MN for likely surgery by Dr. Sharol Given tomorrow who will evaluate later today or in AM.    Lisette Abu, PA-C Orthopedic Surgery [phone] 05/04/2022, 4:12 PM

## 2022-05-04 NOTE — ED Notes (Signed)
ED TO INPATIENT HANDOFF REPORT  ED Nurse Name and Phone #: Beza Steppe RN [phone]  S Name/Age/Gender [name] 56 y.o. male Room/Bed: 013C/013C  Code Status   Code Status: Full Code  Home/SNF/Other Home Patient oriented to: self, place, time, and situation Is this baseline? Yes   Triage Complete: Triage complete  Chief Complaint Diabetic foot ulcer (HCC) [A54.098[E11.621, L97.509]  Triage Note Pt arrived POV from home c/o left foot pain and a diabetic ulcer. Pt states the pain is 10/10. Pt endorses swelling and pain.    Allergies No Known Allergies  Level of Care/Admitting Diagnosis ED Disposition     ED Disposition  Admit   Condition  --   Comment  [facility] [[id number]]  Level of Care: Med-Surg [16]  [facility] or [facility] if equivalent level of care is available:: No  Covid Evaluation: Asymptomatic - no recent exposure (last 10 days) testing not required  Diagnosis: Diabetic foot ulcer [facility](HCC) [[id number]][[id number]]  Admitting Physician: Emeline GeneralZHANG, PING T [[id number]]  Attending Physician: Emeline GeneralZHANG, PING T [[id number]]  Estimated length of stay: 3 - 4 days  Certification:: I certify this patient will need inpatient services for at least 2 midnights          B Medical/Surgery History Past Medical History:  Diagnosis Date   Diabetes mellitus without complication [facility](HCC)    Past Surgical History:  Procedure Laterality Date   TONSILLECTOMY       A IV Location/Drains/Wounds Patient Lines/Drains/Airways Status     Active Line/Drains/Airways     Name Placement date Placement time Site Days   Peripheral IV 05/04/22 20 G Right Antecubital 05/04/22  1504  Antecubital  less than 1   Wound / Incision (Open or Dehisced) 12/14/21 Diabetic ulcer Foot Left;Other (Comment);Posterior;Lateral open/tunnel 12/14/21  1950  Foot  141            Intake/Output Last 24 hours  Intake/Output Summary (Last 24 hours) at 05/04/2022 1753 Last data filed  at 05/04/2022 1717 Gross per 24 hour  Intake 100 ml  Output --  Net 100 ml    Labs/Imaging Results for orders placed or performed during the hospital encounter of 05/04/22 (from the past 48 hour(s))  CBG monitoring, ED     Status: Abnormal   Collection Time: 05/04/22  1:32 PM  Result Value Ref Range   Glucose-Capillary 462 (H) 70 - 99 mg/dL    Comment: Glucose reference range applies only to samples taken after fasting for at least 8 hours.  CBC     Status: Abnormal   Collection Time: 05/04/22  1:46 PM  Result Value Ref Range   WBC 19.4 (H) 4.0 - 10.5 K/uL   RBC 4.25 4.22 - 5.81 MIL/uL   Hemoglobin 11.4 (L) 13.0 - 17.0 g/dL   HCT 46.935.9 (L) 62.939.0 - 52.852.0 %   MCV 84.5 80.0 - 100.0 fL   MCH 26.8 26.0 - 34.0 pg   MCHC 31.8 30.0 - 36.0 g/dL   RDW 41.313.7 24.411.5 - 01.015.5 %   Platelets 304 150 - 400 K/uL   nRBC 0.0 0.0 - 0.2 %    Comment: Performed at [facility], [address] [place], [place] [address]  Basic metabolic panel     Status: Abnormal   Collection Time: 05/04/22  1:46 PM  Result Value Ref Range   Sodium 131 (L) 135 - 145 mmol/L   Potassium 4.1 3.5 - 5.1 mmol/L  Chloride 96 (L) 98 - 111 mmol/L   CO2 26 22 - 32 mmol/L   Glucose, Bld 455 (H) 70 - 99 mg/dL    Comment: Glucose reference range applies only to samples taken after fasting for at least 8 hours.   BUN 21 (H) 6 - 20 mg/dL   Creatinine, Ser 2.13 (H) 0.61 - 1.24 mg/dL   Calcium 8.5 (L) 8.9 - 10.3 mg/dL   GFR, Estimated 48 (L) >60 mL/min    Comment: (NOTE) Calculated using the CKD-EPI Creatinine Equation (2021)    Anion gap 9 5 - 15    Comment: Performed at [facility], [address], [place], [place] [address]  Lactic acid, plasma     Status: None   Collection Time: 05/04/22  3:06 PM  Result Value Ref Range   Lactic Acid, Venous 1.6 0.5 - 1.9 mmol/L    Comment: Performed at [facility], [address] [place], [place] [address]  Lactic acid, plasma     Status: None   Collection Time:  05/04/22  3:49 PM  Result Value Ref Range   Lactic Acid, Venous 1.2 0.5 - 1.9 mmol/L    Comment: Performed at [facility], [address], [place], [place] [address]  Sedimentation rate     Status: Abnormal   Collection Time: 05/04/22  4:05 PM  Result Value Ref Range   Sed Rate 65 (H) 0 - 16 mm/hr    Comment: Performed at [facility], [address], [place], [place] [address]  C-reactive protein     Status: Abnormal   Collection Time: 05/04/22  4:05 PM  Result Value Ref Range   CRP 27.3 (H) <1.0 mg/dL    Comment: Performed at [facility], [address], [place], [place] [address]   DG Foot Complete Left  Result Date: 05/04/2022 CLINICAL DATA:  Pain in lateral foot EXAM: LEFT FOOT - COMPLETE 3+ VIEW COMPARISON:  12/14/2021 FINDINGS: Ulceration noted in the lateral forefoot. There are 2 small hyperdensities in the region of ulceration which may be foreign body or topical medication. Extensive subcutaneous emphysema of the palmar 4 and midfoot. Enthesopathic changes are seen at the insertion of the plantar fascia and Achilles tendon on the calcaneus. IMPRESSION: Lateral forefoot soft tissue ulceration containing small hyperdensities which may be foreign body or medication. Please correlate with examination. Extensive subcutaneous emphysema of the plantar mid and forefoot. No osseous erosions are seen to indicate osteomyelitis. Electronically Signed   By: Acquanetta Belling M.D.   On: 05/04/2022 14:09    Pending Labs Unresulted Labs (From admission, onward)     Start     Ordered   05/05/22 0500  Basic metabolic panel  Tomorrow morning,   R        05/04/22 1734   05/05/22 0500  CBC  Tomorrow morning,   R        05/04/22 1734   05/04/22 1520  Aerobic/Anaerobic Culture w Gram Stain (surgical/deep wound)  Once,   URGENT        05/04/22 1520   05/04/22 1444  Blood culture (routine x 2)  BLOOD CULTURE X 2,   R      05/04/22 1443            Vitals/Pain Today's Vitals    05/04/22 1332 05/04/22 1445 05/04/22 1715 05/04/22 1750  BP:  125/61 120/75 (!) 125/92  Pulse:  100 92 96  Resp:  (!)  21 16 12   Temp:      TempSrc:      SpO2:  100% 97% 99%  Weight: 106.6 kg     Height: 5\' 9"  (1.753 m)     PainSc: 10-Worst pain ever       Isolation Precautions No active isolations  Medications Medications  lactated ringers infusion ( Intravenous New Bag/Given 05/04/22 1715)  vancomycin (VANCOREADY) IVPB 2000 mg/400 mL (2,000 mg Intravenous New Bag/Given 05/04/22 1712)  ondansetron (ZOFRAN) injection 4 mg (has no administration in time range)  piperacillin-tazobactam (ZOSYN) IVPB 3.375 g (has no administration in time range)  acidophilus (RISAQUAD) capsule 2 capsule (has no administration in time range)  acetaminophen (TYLENOL) tablet 650 mg (has no administration in time range)  enoxaparin (LOVENOX) injection 40 mg (40 mg Subcutaneous Given 05/04/22 1750)  0.9 %  sodium chloride infusion (has no administration in time range)  oxyCODONE (Oxy IR/ROXICODONE) immediate release tablet 5 mg (has no administration in time range)  HYDROmorphone (DILAUDID) injection 0.5-1 mg (has no administration in time range)  bisacodyl (DULCOLAX) suppository 10 mg (has no administration in time range)  ondansetron (ZOFRAN) tablet 4 mg (has no administration in time range)    Or  ondansetron (ZOFRAN) injection 4 mg (has no administration in time range)  insulin aspart (novoLOG) injection 0-15 Units (has no administration in time range)  morphine (PF) 4 MG/ML injection 4 mg (4 mg Intravenous Given 05/04/22 1744)    Mobility walks Low fall risk    R Recommendations: See Admitting Provider Note  Report given to: 02-02-1984 RN  Additional Notes:

## 2022-05-04 NOTE — ED Provider Notes (Signed)
[facility] EMERGENCY DEPARTMENT Provider Note   CSN: [id number] Arrival date & time: 05/04/22  1321     History  Chief Complaint  Patient presents with   Foot Pain    [name] is a 56 y.o. male with a past medical history of diabetic foot ulcer, diabetes, tobacco use, who presents today for evaluation of pain and swelling of the left foot. He reports that over the past 3 days he has developed significant edema on the bottom of the left foot.  He has a chronic wound. He was admitted for this in January of this year, and states that he completed all his antibiotics however unable to obtain follow-up. He denies any fevers.  HPI     Home Medications Prior to Admission medications   Medication Sig Start Date End Date Taking? Authorizing Provider  acetaminophen (TYLENOL) 500 MG tablet Take 2,000 mg by mouth every 6 (six) hours as needed for mild pain or moderate pain.   Yes [provider]  ibuprofen (ADVIL) 200 MG tablet Take 400-800 mg by mouth daily as needed for mild pain or moderate pain.   Yes [provider]  insulin isophane & regular human KwikPen (HUMULIN 70/30 KWIKPEN) (70-30) 100 UNIT/ML KwikPen Inject 10 Units into the skin 2 (two) times daily with a meal. 12/15/21 07/08/22 Yes Autry-Lott, Naaman Plummer, DO  metFORMIN (GLUCOPHAGE) 500 MG tablet Take 1 tablet (500 mg total) by mouth 2 (two) times daily. 12/15/21  Yes Autry-Lott, Naaman Plummer, DO  famotidine (PEPCID) 20 MG tablet Take 1 tablet (20 mg total) by mouth 2 (two) times daily. Patient not taking: Reported on 05/04/2022 12/29/19   Sherwood Gambler, MD  Insulin Pen Needle 32G X 4 MM MISC Use as directed to inject insulin up to 4 times daily. 12/15/21         Allergies    Patient has no known allergies.    Review of Systems   Review of Systems See above Physical Exam Updated Vital Signs BP 125/61   Pulse 100   Temp 99.1 F (37.3 C) (Oral)   Resp (!) 21   Ht _0  (1.753 m)   Wt 106.6 kg    SpO2 100%   BMI 34.70 kg/m  Physical Exam Vitals and nursing note reviewed.  Constitutional:      General: He is not in acute distress.    Appearance: He is not ill-appearing.  HENT:     Head: Atraumatic.  Eyes:     Conjunctiva/sclera: Conjunctivae normal.  Cardiovascular:     Rate and Rhythm: Normal rate.  Pulmonary:     Effort: Pulmonary effort is normal. No respiratory distress.  Abdominal:     General: There is no distension.  Musculoskeletal:     Cervical back: Normal range of motion and neck supple.     Comments: No obvious acute injury  Skin:    General: Skin is warm and dry.     Comments: Please see clinical image.  There is a large purulent appearing fluid collection on the plantar surface of the left foot.  This is fluctuant.  There is mild surrounding edema and erythema.  There is a 1.5 cm chronic wound with debris over the lateral foot.  ( I personally removed what appeared to be lint and at least one small white foreign body out of the wound) on the plantar surface of the foot.   Neurological:     Mental Status: He is alert.  Comments: Awake and alert, answers all questions appropriately.  Speech is not slurred.  Psychiatric:        Mood and Affect: Mood normal.        Behavior: Behavior normal.           ED Results / Procedures / Treatments   Labs (all labs ordered are listed, but only abnormal results are displayed) Labs Reviewed  CBC - Abnormal; Notable for the following components:      Result Value   WBC 19.4 (*)    Hemoglobin 11.4 (*)    HCT 35.9 (*)    All other components within normal limits  BASIC METABOLIC PANEL - Abnormal; Notable for the following components:   Sodium 131 (*)    Chloride 96 (*)    Glucose, Bld 455 (*)    BUN 21 (*)    Creatinine, Ser 1.67 (*)    Calcium 8.5 (*)    GFR, Estimated 48 (*)    All other components within normal limits  C-REACTIVE PROTEIN - Abnormal; Notable for the following components:   CRP 27.3 (*)     All other components within normal limits  CBG MONITORING, ED - Abnormal; Notable for the following components:   Glucose-Capillary 462 (*)    All other components within normal limits  CULTURE, BLOOD (ROUTINE X 2)  CULTURE, BLOOD (ROUTINE X 2)  AEROBIC/ANAEROBIC CULTURE W GRAM STAIN (SURGICAL/DEEP WOUND)  LACTIC ACID, PLASMA  LACTIC ACID, PLASMA  SEDIMENTATION RATE    EKG None  Radiology DG Foot Complete Left  Result Date: 05/04/2022 CLINICAL DATA:  Pain in lateral foot EXAM: LEFT FOOT - COMPLETE 3+ VIEW COMPARISON:  12/14/2021 FINDINGS: Ulceration noted in the lateral forefoot. There are 2 small hyperdensities in the region of ulceration which may be foreign body or topical medication. Extensive subcutaneous emphysema of the palmar 4 and midfoot. Enthesopathic changes are seen at the insertion of the plantar fascia and Achilles tendon on the calcaneus. IMPRESSION: Lateral forefoot soft tissue ulceration containing small hyperdensities which may be foreign body or medication. Please correlate with examination. Extensive subcutaneous emphysema of the plantar mid and forefoot. No osseous erosions are seen to indicate osteomyelitis. Electronically Signed   By: Miachel Roux M.D.   On: 05/04/2022 14:09    Procedures .Marland KitchenIncision and Drainage  Date/Time: 05/04/2022 4:52 PM Performed by: Lorin Glass, PA-C Authorized by: Lorin Glass, PA-C   Consent:    Consent obtained:  Verbal   Consent given by:  Patient   Risks, benefits, and alternatives were discussed: yes     Risks discussed:  Bleeding, incomplete drainage, pain, infection and damage to other organs (Damage to other structures, need for additional procedures)   Alternatives discussed:  No treatment, alternative treatment and referral Universal protocol:    Procedure explained and questions answered to patient or proxy's satisfaction: yes     Patient identity confirmed:  Verbally with patient Location:    Type:   Abscess   Size:  7cmx7cm Pre-procedure details:    Skin preparation:  Chloraprep Sedation:    Sedation type:  None Anesthesia:    Anesthesia method:  Topical application Procedure type:    Complexity:  Simple Procedure details:    Incision types:  Stab incision   Incision depth:  Subcutaneous   Wound management:  Probed and deloculated   Drainage:  Purulent (Necrotic)   Drainage amount:  Copious   Wound treatment:  Wound left open Post-procedure details:    Procedure  completion:  Tolerated well, no immediate complications Comments:     I discussed with patient that given the size and extent of his infection my goal with this I&D is primarily to obtain cultures prior to systemic antibiotics.  We discussed that he most likely will need additional procedures including possible operative washout.  Antibiotics were not held for this however as patient appeared septic. .Critical Care Performed by: Lorin Glass, PA-C Authorized by: Lorin Glass, PA-C   Critical care provider statement:    Critical care time (minutes):  40   Critical care time was exclusive of:  Teaching time and separately billable procedures and treating other patients   Critical care was necessary to treat or prevent imminent or life-threatening deterioration of the following conditions:  Sepsis   Critical care was time spent personally by me on the following activities:  Development of treatment plan with patient or surrogate, discussions with consultants, evaluation of patient's response to treatment, examination of patient, ordering and review of laboratory studies, ordering and review of radiographic studies, ordering and performing treatments and interventions, pulse oximetry, re-evaluation of patient's condition, review of old charts and obtaining history from patient or surrogate   I assumed direction of critical care for this patient from another provider in my specialty: no     Care discussed with:  admitting provider      Medications Ordered in ED Medications  lactated ringers infusion (has no administration in time range)  ceFEPIme (MAXIPIME) 2 g in sodium chloride 0.9 % 100 mL IVPB (2 g Intravenous New Bag/Given 05/04/22 1504)  vancomycin (VANCOREADY) IVPB 2000 mg/400 mL (has no administration in time range)  morphine (PF) 4 MG/ML injection 4 mg (has no administration in time range)  ondansetron (ZOFRAN) injection 4 mg (has no administration in time range)    ED Course/ Medical Decision Making/ A&P Clinical Course as of 05/04/22 1817  Thu May 04, 2022  1723 I spoke with hospitalist who will see patient [EH]    Clinical Course User Index [EH] Lorin Glass, PA-C                           Medical Decision Making Amount and/or Complexity of Data Reviewed Labs: ordered.  Risk OTC drugs. Prescription drug management. Decision regarding hospitalization.   This patient presents to the ED for concern of left foot pain and infection, this involves an extensive number of treatment options, and is a complaint that carries with it a high risk of complications and morbidity.  The differential diagnosis includes abscess, cellulitis, diabetic foot ulcer, necrotizing fasciitis, myositis, osteomyelitis,   Co morbidities that complicate the patient evaluation  DM2, medical noncompliance, chronic ulcer   Additional history obtained:  Additional history obtained from chart review External records from outside source obtained and reviewed including notes from hospitalization earlier this year   Lab Tests:  I Ordered, and personally interpreted labs.  The pertinent results include:   Leukocytosis with a white count of 19.4.  Mild anemia with a hemoglobin of 11.4. BMP shows creatinine is elevated at 1.67, up from 1.1.  Hyperglycemia with a glucose of 455.  Sodium is slightly low at 131. Blood cultures were ordered Lactic acid is not significantly elevated at 1.2. I  personally obtained wound cultures from patient's foot during I&D ESR is elevated at 65, CRP is elevated at 27.3   Imaging Studies ordered:  I ordered imaging studies including x-rays of the  left foot I independently visualized and interpreted imaging which showed extensive subcutaneous emphysema, on clinical exam this appears to be roughly consistent with the area of abscess   Consultations Obtained:  I requested consultation with orthopedics.  They requested medicine admit, will take patient to the OR tomorrow morning for washout versus amputation. I requested hospitalist consultation, I spoke with hospitalist who will see patient for admission.  Problem List / ED Course / Critical interventions / Medication management  While patient did meet criteria for sepsis with leukocytosis and tachycardia, new digit addition to heart rate over 96 he did not meet criteria for severe sepsis or septic shock, he did not require 30/kg fluid bolus as his lactic was not over 4 and he was not hypotensive while in my care.  He was started on broad-spectrum antibiotics. I ordered medication including morphine for pain, IV fluids for hydration, vancomycin and cefepime for sepsis, diabetic foot infection Reevaluation of the patient after these medicines showed that the patient improved I have reviewed the patients home medicines and have made adjustments as needed   Social Determinants of Health:  Medical noncompliance, no insurance   Test / Admission - Considered:  Patient will require admission. The patient appears reasonably stabilized for admission considering the current resources, flow, and capabilities available in the ED at this time, and I doubt any other [facility] requiring further screening and/or treatment in the ED prior to admission assuming timely admission and bed placement.  Note: Portions of this report may have been transcribed using voice recognition software. Every effort was made to  ensure accuracy; however, inadvertent computerized transcription errors may be present          Final Clinical Impression(s) / ED Diagnoses Final diagnoses:  Cellulitis and abscess of foot  Diabetic ulcer of left foot associated with type 2 diabetes mellitus, with other ulcer severity, unspecified part of foot [facility])    Rx / [place] Orders ED Discharge Orders     None         Ollen Gross 05/04/22 1825    Luna Fuse, MD 05/14/22 2205

## 2022-05-04 NOTE — ED Notes (Signed)
This Rn attempted to call 6N regarding pt's status of comign up to the floor. Was not able to reach nurse. Will attempt to call again.

## 2022-05-04 NOTE — Progress Notes (Signed)
Elink following code serpsis

## 2022-05-05 ENCOUNTER — Inpatient Hospital Stay (HOSPITAL_COMMUNITY): Payer: Self-pay | Admitting: Anesthesiology

## 2022-05-05 ENCOUNTER — Other Ambulatory Visit: Payer: Self-pay

## 2022-05-05 ENCOUNTER — Encounter (HOSPITAL_COMMUNITY): Payer: Self-pay | Admitting: Internal Medicine

## 2022-05-05 ENCOUNTER — Encounter (HOSPITAL_COMMUNITY)
Admission: EM | Disposition: A | Discharge status: Home Health | Payer: Self-pay | Source: Home / Self Care | Attending: Family Medicine

## 2022-05-05 DIAGNOSIS — L02619 Cutaneous abscess of unspecified foot: Secondary | ICD-10-CM

## 2022-05-05 DIAGNOSIS — E11628 Type 2 diabetes mellitus with other skin complications: Secondary | ICD-10-CM

## 2022-05-05 DIAGNOSIS — Z794 Long term (current) use of insulin: Secondary | ICD-10-CM

## 2022-05-05 DIAGNOSIS — L02612 Cutaneous abscess of left foot: Secondary | ICD-10-CM

## 2022-05-05 DIAGNOSIS — L97424 Non-pressure chronic ulcer of left heel and midfoot with necrosis of bone: Secondary | ICD-10-CM

## 2022-05-05 DIAGNOSIS — E119 Type 2 diabetes mellitus without complications: Secondary | ICD-10-CM

## 2022-05-05 DIAGNOSIS — E1165 Type 2 diabetes mellitus with hyperglycemia: Secondary | ICD-10-CM

## 2022-05-05 DIAGNOSIS — L03119 Cellulitis of unspecified part of limb: Secondary | ICD-10-CM

## 2022-05-05 DIAGNOSIS — E669 Obesity, unspecified: Secondary | ICD-10-CM

## 2022-05-05 HISTORY — PX: I & D EXTREMITY: SHX5045

## 2022-05-05 LAB — CBC
HCT: 31.7 % — ABNORMAL LOW (ref 39.0–52.0)
Hemoglobin: 10.5 g/dL — ABNORMAL LOW (ref 13.0–17.0)
MCH: 27.4 pg (ref 26.0–34.0)
MCHC: 33.1 g/dL (ref 30.0–36.0)
MCV: 82.8 fL (ref 80.0–100.0)
Platelets: 251 10*3/uL (ref 150–400)
RBC: 3.83 MIL/uL — ABNORMAL LOW (ref 4.22–5.81)
RDW: 13.7 % (ref 11.5–15.5)
WBC: 19 10*3/uL — ABNORMAL HIGH (ref 4.0–10.5)
nRBC: 0 % (ref 0.0–0.2)

## 2022-05-05 LAB — SURGICAL PCR SCREEN
MRSA, PCR: POSITIVE — AB
Staphylococcus aureus: POSITIVE — AB

## 2022-05-05 LAB — BASIC METABOLIC PANEL
Anion gap: 7 (ref 5–15)
BUN: 20 mg/dL (ref 6–20)
CO2: 25 mmol/L (ref 22–32)
Calcium: 8.4 mg/dL — ABNORMAL LOW (ref 8.9–10.3)
Chloride: 105 mmol/L (ref 98–111)
Creatinine, Ser: 1.43 mg/dL — ABNORMAL HIGH (ref 0.61–1.24)
GFR, Estimated: 58 mL/min — ABNORMAL LOW (ref >60)
Glucose, Bld: 145 mg/dL — ABNORMAL HIGH (ref 70–99)
Potassium: 3.8 mmol/L (ref 3.5–5.1)
Sodium: 137 mmol/L (ref 135–145)

## 2022-05-05 LAB — GLUCOSE, CAPILLARY
Glucose-Capillary: 146 mg/dL — ABNORMAL HIGH (ref 70–99)
Glucose-Capillary: 150 mg/dL — ABNORMAL HIGH (ref 70–99)
Glucose-Capillary: 162 mg/dL — ABNORMAL HIGH (ref 70–99)
Glucose-Capillary: 162 mg/dL — ABNORMAL HIGH (ref 70–99)
Glucose-Capillary: 375 mg/dL — ABNORMAL HIGH (ref 70–99)
Glucose-Capillary: 400 mg/dL — ABNORMAL HIGH (ref 70–99)
Glucose-Capillary: 403 mg/dL — ABNORMAL HIGH (ref 70–99)
Glucose-Capillary: 420 mg/dL — ABNORMAL HIGH (ref 70–99)

## 2022-05-05 SURGERY — IRRIGATION AND DEBRIDEMENT EXTREMITY
Anesthesia: General | Laterality: Left

## 2022-05-05 MED ORDER — FENTANYL CITRATE (PF) 250 MCG/5ML IJ SOLN
INTRAMUSCULAR | Status: DC | PRN
  Administered 2022-05-05: 50 ug via INTRAVENOUS

## 2022-05-05 MED ORDER — ONDANSETRON HCL 4 MG/2ML IJ SOLN: 4.0000 mg | Freq: Four times a day (QID) | INTRAMUSCULAR | Status: DC | PRN

## 2022-05-05 MED ORDER — METOCLOPRAMIDE HCL 5 MG/ML IJ SOLN: 5.0000 mg | Freq: Three times a day (TID) | INTRAMUSCULAR | Status: DC | PRN

## 2022-05-05 MED ORDER — AMISULPRIDE (ANTIEMETIC) 5 MG/2ML IV SOLN: 10.0000 mg | Freq: Once | INTRAVENOUS | Status: DC | PRN

## 2022-05-05 MED ORDER — FENTANYL CITRATE (PF) 100 MCG/2ML IJ SOLN
INTRAMUSCULAR | Status: AC
  Filled 2022-05-05: qty 2

## 2022-05-05 MED ORDER — CHLORHEXIDINE GLUCONATE CLOTH 2 % EX PADS
6.0000 | MEDICATED_PAD | Freq: Every day | CUTANEOUS | Status: DC
  Administered 2022-05-06 – 2022-05-07 (×2): 6 via TOPICAL

## 2022-05-05 MED ORDER — MIDAZOLAM HCL 2 MG/2ML IJ SOLN
INTRAMUSCULAR | Status: AC
  Filled 2022-05-05: qty 2

## 2022-05-05 MED ORDER — MUPIROCIN 2 % EX OINT
1.0000 "application " | TOPICAL_OINTMENT | Freq: Two times a day (BID) | CUTANEOUS | Status: DC
  Administered 2022-05-05 – 2022-05-07 (×7): 1 via NASAL
  Filled 2022-05-05: qty 22

## 2022-05-05 MED ORDER — PROPOFOL 10 MG/ML IV BOLUS
INTRAVENOUS | Status: AC
  Filled 2022-05-05: qty 20

## 2022-05-05 MED ORDER — METOCLOPRAMIDE HCL 5 MG PO TABS: 5.0000 mg | ORAL_TABLET | Freq: Three times a day (TID) | ORAL | Status: DC | PRN

## 2022-05-05 MED ORDER — BISACODYL 10 MG RE SUPP: 10.0000 mg | Freq: Every day | RECTAL | Status: DC | PRN

## 2022-05-05 MED ORDER — INSULIN ASPART 100 UNIT/ML IJ SOLN
INTRAMUSCULAR | Status: AC
  Filled 2022-05-05: qty 1

## 2022-05-05 MED ORDER — PROPOFOL 500 MG/50ML IV EMUL
INTRAVENOUS | Status: DC | PRN
  Administered 2022-05-05: 150 ug/kg/min via INTRAVENOUS

## 2022-05-05 MED ORDER — PROPOFOL 10 MG/ML IV BOLUS
INTRAVENOUS | Status: DC | PRN
  Administered 2022-05-05: 150 mg via INTRAVENOUS

## 2022-05-05 MED ORDER — METHOCARBAMOL 500 MG PO TABS
500.0000 mg | ORAL_TABLET | Freq: Four times a day (QID) | ORAL | Status: DC | PRN
  Administered 2022-05-06: 500 mg via ORAL
  Filled 2022-05-05 (×2): qty 1

## 2022-05-05 MED ORDER — ONDANSETRON HCL 4 MG/2ML IJ SOLN: 4.0000 mg | Freq: Once | INTRAMUSCULAR | Status: DC | PRN

## 2022-05-05 MED ORDER — ORAL CARE MOUTH RINSE: 15.0000 mL | Freq: Once | OROMUCOSAL | Status: AC

## 2022-05-05 MED ORDER — CHLORHEXIDINE GLUCONATE 0.12 % MT SOLN
OROMUCOSAL | Status: AC
  Administered 2022-05-05: 15 mL via OROMUCOSAL
  Filled 2022-05-05: qty 15

## 2022-05-05 MED ORDER — SODIUM CHLORIDE 0.9 % IV SOLN: INTRAVENOUS | Status: DC

## 2022-05-05 MED ORDER — POLYETHYLENE GLYCOL 3350 17 G PO PACK: 17.0000 g | PACK | Freq: Every day | ORAL | Status: DC | PRN

## 2022-05-05 MED ORDER — ACETAMINOPHEN 325 MG PO TABS: 325.0000 mg | ORAL_TABLET | Freq: Four times a day (QID) | ORAL | Status: DC | PRN

## 2022-05-05 MED ORDER — METHOCARBAMOL 1000 MG/10ML IJ SOLN: 500.0000 mg | Freq: Four times a day (QID) | INTRAVENOUS | Status: DC | PRN

## 2022-05-05 MED ORDER — OXYCODONE HCL 5 MG/5ML PO SOLN: 5.0000 mg | Freq: Once | ORAL | Status: DC | PRN

## 2022-05-05 MED ORDER — 0.9 % SODIUM CHLORIDE (POUR BTL) OPTIME
TOPICAL | Status: DC | PRN
  Administered 2022-05-05: 1000 mL

## 2022-05-05 MED ORDER — FENTANYL CITRATE (PF) 250 MCG/5ML IJ SOLN
INTRAMUSCULAR | Status: AC
  Filled 2022-05-05: qty 5

## 2022-05-05 MED ORDER — OXYCODONE HCL 5 MG PO TABS: 5.0000 mg | ORAL_TABLET | Freq: Once | ORAL | Status: DC | PRN

## 2022-05-05 MED ORDER — OXYCODONE HCL 5 MG PO TABS
5.0000 mg | ORAL_TABLET | ORAL | Status: DC | PRN
  Administered 2022-05-07: 5 mg via ORAL
  Administered 2022-05-08 – 2022-05-11 (×5): 10 mg via ORAL
  Filled 2022-05-05 (×3): qty 2
  Filled 2022-05-05: qty 1
  Filled 2022-05-05 (×2): qty 2

## 2022-05-05 MED ORDER — INSULIN ASPART 100 UNIT/ML IJ SOLN
0.0000 [IU] | INTRAMUSCULAR | Status: DC | PRN
  Administered 2022-05-05: 2 [IU] via SUBCUTANEOUS

## 2022-05-05 MED ORDER — GADOBUTROL 1 MMOL/ML IV SOLN
10.0000 mL | Freq: Once | INTRAVENOUS | Status: AC | PRN
Start: 2022-05-05 — End: 2022-05-05
  Administered 2022-05-05: 10 mL via INTRAVENOUS

## 2022-05-05 MED ORDER — DOCUSATE SODIUM 100 MG PO CAPS
100.0000 mg | ORAL_CAPSULE | Freq: Two times a day (BID) | ORAL | Status: DC
  Administered 2022-05-05 – 2022-05-11 (×12): 100 mg via ORAL
  Filled 2022-05-05 (×13): qty 1

## 2022-05-05 MED ORDER — ONDANSETRON HCL 4 MG PO TABS: 4.0000 mg | ORAL_TABLET | Freq: Four times a day (QID) | ORAL | Status: DC | PRN

## 2022-05-05 MED ORDER — DEXAMETHASONE SODIUM PHOSPHATE 10 MG/ML IJ SOLN
INTRAMUSCULAR | Status: DC | PRN
  Administered 2022-05-05: 10 mg via INTRAVENOUS

## 2022-05-05 MED ORDER — ENOXAPARIN SODIUM 60 MG/0.6ML IJ SOSY
50.0000 mg | PREFILLED_SYRINGE | INTRAMUSCULAR | Status: DC
  Administered 2022-05-05 – 2022-05-11 (×7): 50 mg via SUBCUTANEOUS
  Filled 2022-05-05 (×7): qty 0.6

## 2022-05-05 MED ORDER — CHLORHEXIDINE GLUCONATE 0.12 % MT SOLN: 15.0000 mL | Freq: Once | OROMUCOSAL | Status: AC

## 2022-05-05 MED ORDER — LACTATED RINGERS IV SOLN: INTRAVENOUS | Status: DC

## 2022-05-05 MED ORDER — FENTANYL CITRATE (PF) 100 MCG/2ML IJ SOLN
25.0000 ug | INTRAMUSCULAR | Status: DC | PRN
  Administered 2022-05-05 (×2): 50 ug via INTRAVENOUS

## 2022-05-05 MED ORDER — MIDAZOLAM HCL 2 MG/2ML IJ SOLN
INTRAMUSCULAR | Status: DC | PRN
  Administered 2022-05-05: 2 mg via INTRAVENOUS

## 2022-05-05 MED ORDER — ONDANSETRON HCL 4 MG/2ML IJ SOLN
INTRAMUSCULAR | Status: DC | PRN
  Administered 2022-05-05: 4 mg via INTRAVENOUS

## 2022-05-05 MED ORDER — LIDOCAINE 2% (20 MG/ML) 5 ML SYRINGE
INTRAMUSCULAR | Status: DC | PRN
  Administered 2022-05-05: 100 mg via INTRAVENOUS

## 2022-05-05 MED ORDER — HYDROMORPHONE HCL 1 MG/ML IJ SOLN
0.5000 mg | INTRAMUSCULAR | Status: DC | PRN
  Administered 2022-05-05 – 2022-05-09 (×9): 1 mg via INTRAVENOUS
  Filled 2022-05-05 (×9): qty 1

## 2022-05-05 SURGICAL SUPPLY — 34 items
BAG COUNTER SPONGE SURGICOUNT (BAG) IMPLANT
BLADE SURG 21 STRL SS (BLADE) ×2 IMPLANT
BNDG COHESIVE 6X5 TAN NS LF (GAUZE/BANDAGES/DRESSINGS) ×1 IMPLANT
BNDG COHESIVE 6X5 TAN STRL LF (GAUZE/BANDAGES/DRESSINGS) IMPLANT
BNDG GAUZE ELAST 4 BULKY (GAUZE/BANDAGES/DRESSINGS) ×4 IMPLANT
COVER SURGICAL LIGHT HANDLE (MISCELLANEOUS) ×4 IMPLANT
DRAPE U-SHAPE 47X51 STRL (DRAPES) ×2 IMPLANT
DRSG ADAPTIC 3X8 NADH LF (GAUZE/BANDAGES/DRESSINGS) ×2 IMPLANT
DRSG CLEANSE VERAFLOW (GAUZE/BANDAGES/DRESSINGS) IMPLANT
DURAPREP 26ML APPLICATOR (WOUND CARE) ×2 IMPLANT
ELECT REM PT RETURN 9FT ADLT (ELECTROSURGICAL)
ELECTRODE REM PT RTRN 9FT ADLT (ELECTROSURGICAL) IMPLANT
GAUZE PAD ABD 8X10 STRL (GAUZE/BANDAGES/DRESSINGS) ×1 IMPLANT
GAUZE SPONGE 4X4 12PLY STRL (GAUZE/BANDAGES/DRESSINGS) ×2 IMPLANT
GLOVE BIOGEL PI IND STRL 9 (GLOVE) ×1 IMPLANT
GLOVE BIOGEL PI INDICATOR 9 (GLOVE) ×1
GLOVE SURG ORTHO 9.0 STRL STRW (GLOVE) ×2 IMPLANT
GOWN STRL REUS W/ TWL XL LVL3 (GOWN DISPOSABLE) ×2 IMPLANT
GOWN STRL REUS W/TWL XL LVL3 (GOWN DISPOSABLE) ×4
HANDPIECE INTERPULSE COAX TIP (DISPOSABLE)
KIT BASIN OR (CUSTOM PROCEDURE TRAY) ×2 IMPLANT
KIT TURNOVER KIT B (KITS) ×2 IMPLANT
MANIFOLD NEPTUNE II (INSTRUMENTS) ×2 IMPLANT
NS IRRIG 1000ML POUR BTL (IV SOLUTION) ×2 IMPLANT
PACK ORTHO EXTREMITY (CUSTOM PROCEDURE TRAY) ×2 IMPLANT
PAD ARMBOARD 7.5X6 YLW CONV (MISCELLANEOUS) ×4 IMPLANT
SET HNDPC FAN SPRY TIP SCT (DISPOSABLE) IMPLANT
STOCKINETTE IMPERVIOUS 9X36 MD (GAUZE/BANDAGES/DRESSINGS) IMPLANT
SUT ETHILON 2 0 PSLX (SUTURE) ×2 IMPLANT
SWAB COLLECTION DEVICE MRSA (MISCELLANEOUS) ×2 IMPLANT
SWAB CULTURE ESWAB REG 1ML (MISCELLANEOUS) IMPLANT
TOWEL GREEN STERILE (TOWEL DISPOSABLE) ×2 IMPLANT
TUBE CONNECTING 12X1/4 (SUCTIONS) ×2 IMPLANT
YANKAUER SUCT BULB TIP NO VENT (SUCTIONS) ×2 IMPLANT

## 2022-05-05 NOTE — TOC Initial Note (Signed)
Transition of Care [facility]) - Initial/Assessment Note    Patient Details  Name: [name] MRN: [id number] Date of Birth: February 16, 1966  Transition of Care [facility]) CM/SW Contact:    Marilu Favre, RN Phone Number: 05/05/2022, 8:19 AM  Clinical Narrative:                 Spoke to patient at bedside. PAtient from home alone but has brother and nephrew close by.   NCM saw patient during his admission in  12/2021 . At that time NCM scheduled hospital follow up / establish care appointment with [place] for 01/2022. PAtient stated he went to [facility] for appointment but was told he had to pay $250.00 up front to be seen. PAtient consented for NCM to call [facility].   NCM called [facility] spoke to Faith who stated it is never $250.00 to be seen at [facility] There is a $50.00 fee, if patient does not have $50.00 they can bill him and their financial counselor will also see patient. Appointment scheduled for July 3 , 2023 at 0930 am .   Patient aware of above and states he can afford $50.00 fee and has transportation. Patient can also use pharmacy at [facility].   Expected Discharge Plan: Home/Self Care Barriers to Discharge: Continued Medical Work up   Patient Goals and CMS Choice Patient states their goals for this hospitalization and ongoing recovery are:: to return to home      Expected Discharge Plan and Services Expected Discharge Plan: Home/Self Care   Discharge Planning Services: CM Consult   Living arrangements for the past 2 months: Single Family Home                 DME Arranged: N/A         HH Arranged: NA          Prior Living Arrangements/Services Living arrangements for the past 2 months: Single Family Home Lives with:: Self Patient language and need for interpreter reviewed:: Yes Do you feel safe going back to the place where you live?: Yes      Need for Family Participation in Patient Care: Yes (Comment) Care giver support system in place?: Yes (comment)   Criminal  Activity/Legal Involvement Pertinent to Current Situation/Hospitalization: No - Comment as needed  Activities of Daily Living Home Assistive Devices/Equipment: None ADL Screening (condition at time of admission) Patient's cognitive ability adequate to safely complete daily activities?: Yes Is the patient deaf or have difficulty hearing?: No Does the patient have difficulty seeing, even when wearing glasses/contacts?: No Does the patient have difficulty concentrating, remembering, or making decisions?: No Patient able to express need for assistance with ADLs?: Yes Does the patient have difficulty dressing or bathing?: No Independently performs ADLs?: Yes (appropriate for developmental age) Does the patient have difficulty walking or climbing stairs?: No Weakness of Legs: None Weakness of Arms/Hands: None  Permission Sought/Granted   Permission granted to share information with : No              Emotional Assessment Appearance:: Appears stated age Attitude/Demeanor/Rapport: Engaged Affect (typically observed): Accepting Orientation: : Oriented to Self, Oriented to Place, Oriented to  Time, Oriented to Situation Alcohol / Substance Use: Not Applicable Psych Involvement: No (comment)  Admission diagnosis:  Cellulitis and abscess of foot [L03.119, L02.619] Diabetic foot ulcer ([place]) [id number], L97.509] Diabetic ulcer of left foot associated with type 2 diabetes mellitus, with other ulcer severity, unspecified part of foot ([place]) [id number], L97.528] Patient Active  Problem List   Diagnosis Date Noted   AKI (acute kidney injury) ([place]) 05/04/2022   Injury of left foot    Diabetic foot ulcer ([place]) 12/14/2021   PCP:  Patient, No Pcp Per (Inactive) Pharmacy:   [facility] [address] [place] [address] Phone: [phone] Fax: [place] [address] [place]  [address] Phone: [phone] Fax: [phone]     Social Determinants of Health (SDOH) Interventions    Readmission Risk Interventions     View : No data to display.

## 2022-05-05 NOTE — Consult Note (Addendum)
ORTHOPAEDIC CONSULTATION  REQUESTING PHYSICIAN: Elodia Florence., *  Chief Complaint: Abscess plantar aspect left foot.  HPI: [name] is a 56 y.o. male who presents with progressive abscess plantar aspect left foot.  Patient states that he was initially treated in the emergency department for irrigation debridement of a plantar ulcer.  He states that this ulcer has spread and now has an abscess involving the entire plantar aspect of his foot.  Patient has uncontrolled type 2 diabetes.  Past Medical History:  Diagnosis Date   Diabetes mellitus without complication ([place])    Past Surgical History:  Procedure Laterality Date   TONSILLECTOMY     Social History   Socioeconomic History   Marital status: Single    Spouse name: Not on file   Number of children: Not on file   Years of education: Not on file   Highest education level: Not on file  Occupational History   Not on file  Tobacco Use   Smoking status: Every Day    Packs/day: 1.00    Types: Cigarettes   Smokeless tobacco: Never  Vaping Use   Vaping Use: Never used  Substance and Sexual Activity   Alcohol use: No   Drug use: No   Sexual activity: Yes  Other Topics Concern   Not on file  Social History Narrative   Not on file   Social Determinants of Health   Financial Resource Strain: Not on file  Food Insecurity: Not on file  Transportation Needs: Not on file  Physical Activity: Not on file  Stress: Not on file  Social Connections: Not on file   History reviewed. No pertinent family history. - negative except otherwise stated in the family history section No Known Allergies Prior to Admission medications   Medication Sig Start Date End Date Taking? Authorizing Provider  acetaminophen (TYLENOL) 500 MG tablet Take 2,000 mg by mouth every 6 (six) hours as needed for mild pain or moderate pain.   Yes [provider]  ibuprofen (ADVIL) 200 MG tablet Take 400-800 mg by mouth daily as needed for  mild pain or moderate pain.   Yes [provider]  insulin isophane & regular human KwikPen (HUMULIN 70/30 KWIKPEN) (70-30) 100 UNIT/ML KwikPen Inject 10 Units into the skin 2 (two) times daily with a meal. 12/15/21 07/08/22 Yes Autry-Lott, Naaman Plummer, DO  metFORMIN (GLUCOPHAGE) 500 MG tablet Take 1 tablet (500 mg total) by mouth 2 (two) times daily. 12/15/21  Yes Autry-Lott, Naaman Plummer, DO  famotidine (PEPCID) 20 MG tablet Take 1 tablet (20 mg total) by mouth 2 (two) times daily. Patient not taking: Reported on 05/04/2022 12/29/19   Sherwood Gambler, MD  Insulin Pen Needle 32G X 4 MM MISC Use as directed to inject insulin up to 4 times daily. 12/15/21      MR FOOT LEFT W WO CONTRAST  Result Date: 05/05/2022 CLINICAL DATA:  Diabetic foot swelling.  Ulcer. EXAM: MRI OF THE LEFT FOOT WITHOUT AND WITH CONTRAST TECHNIQUE: Multiplanar, multisequence MR imaging of the left foot was performed both before and after administration of intravenous contrast. Osteomyelitis protocol MRI of the foot was obtained, to include the entire foot and ankle. This protocol uses a large field of view to cover the entire foot and ankle, and is suitable for assessing bony structures for osteomyelitis. Due to the large field of view and imaging plane choice, this protocol is less sensitive for assessing small structures such as ligamentous structures of the foot and ankle,  compared to a dedicated forefoot or dedicated hindfoot exam. CONTRAST:  2mL GADAVIST GADOBUTROL 1 MMOL/ML IV SOLN COMPARISON:  12/14/2021 and radiographs 05/04/2022 FINDINGS: Osteomyelitis protocol MRI of the foot was obtained, to include the entire foot and ankle. This protocol uses a large field of view to cover the entire foot and ankle, and is suitable for assessing bony structures for osteomyelitis. Due to the large field of view and imaging plane choice, this protocol is less sensitive for assessing small structures such as ligamentous structures of the foot and  ankle, compared to a dedicated forefoot or dedicated hindfoot exam. Bones/Joint/Cartilage Subtle accentuated edema and enhancement along the plantar side of the fifth metatarsal, nonspecific for arthropathy versus early osteomyelitis. Ligaments Lisfranc ligament intact. Muscles and Tendons Diffuse low-level edema in the regional musculature, possibly neurogenic. Abnormal gas tracks along the plantar margin of the flexor digitorum brevis muscle as shown on image 45 series 16, compatible with infection and myositis. Soft tissues Extensive subcutaneous gas along the plantar foot especially medially as on image 34 series 16. This tracks from about the level of the Chopart joint to the base of the first through fourth toes. Small amount of artifact associated with the ulceration along the plantar base of the fifth metatarsal with localized edema and infiltration of tissue in this vicinity. The most part the gas appears multilocular rather than confluent in a single cavity, and no well-defined drainable abscess is observed. Dorsal subcutaneous edema and enhancement noted in the foot compatible with cellulitis. IMPRESSION: 1. Nonspecific low-grade edema and enhancement along the plantar side of the head of the fifth metatarsal. This is nonspecific for arthropathy versus early osteomyelitis although the proximity to ulceration and localized subcutaneous infiltration tends to favor early osteomyelitis. 2. Extensive gas in the plantar soft tissues, primarily in the subcutaneous tissues extending from about the level of the Chopart joint through the ball of the foot, but also with some extension to involve the plantar portion of the flexor digitorum brevis muscle, indicative of myositis and gas producing soft tissue infection. Electronically Signed   By: Van Clines M.D.   On: 05/05/2022 07:47   DG Foot Complete Left  Result Date: 05/04/2022 CLINICAL DATA:  Pain in lateral foot EXAM: LEFT FOOT - COMPLETE 3+ VIEW  COMPARISON:  12/14/2021 FINDINGS: Ulceration noted in the lateral forefoot. There are 2 small hyperdensities in the region of ulceration which may be foreign body or topical medication. Extensive subcutaneous emphysema of the palmar 4 and midfoot. Enthesopathic changes are seen at the insertion of the plantar fascia and Achilles tendon on the calcaneus. IMPRESSION: Lateral forefoot soft tissue ulceration containing small hyperdensities which may be foreign body or medication. Please correlate with examination. Extensive subcutaneous emphysema of the plantar mid and forefoot. No osseous erosions are seen to indicate osteomyelitis. Electronically Signed   By: Miachel Roux M.D.   On: 05/04/2022 14:09   - pertinent xrays, CT, MRI studies were reviewed and independently interpreted  Positive ROS: All other systems have been reviewed and were otherwise negative with the exception of those mentioned in the HPI and as above.  Physical Exam: General: Alert, no acute distress Psychiatric: Patient is competent for consent with normal mood and affect Lymphatic: No axillary or cervical lymphadenopathy Cardiovascular: No pedal edema Respiratory: No cyanosis, no use of accessory musculature GI: No organomegaly, abdomen is soft and non-tender    Images:  @ENCIMAGES @  Labs:  Lab Results  Component Value Date   HGBA1C 12.0 (H) 12/14/2021  ESRSEDRATE 65 (H) 05/04/2022   ESRSEDRATE 25 (H) 12/14/2021   CRP 27.3 (H) 05/04/2022   CRP 5.1 (H) 12/14/2021   REPTSTATUS 12/19/2021 FINAL 12/14/2021   CULT  12/14/2021    NO GROWTH 5 DAYS Performed at [facility], [place] [address] [place], [place] [address]     Lab Results  Component Value Date   ALBUMIN 3.8 12/14/2021   ALBUMIN 3.3 (L) 12/29/2019   ALBUMIN 3.7 11/10/2010        Latest Ref Rng & Units 05/05/2022    6:22 AM 05/04/2022    1:46 PM 12/15/2021    1:56 AM  CBC EXTENDED  WBC 4.0 - 10.5 K/uL 19.0   19.4   9.5    RBC 4.22 - 5.81 MIL/uL  3.83   4.25   4.09    Hemoglobin 13.0 - 17.0 g/dL 10.5   11.4   10.9    HCT 39.0 - 52.0 % 31.7   35.9   33.8    Platelets 150 - 400 K/uL 251   304   276      Neurologic: Patient does not have protective sensation bilateral lower extremities.   MUSCULOSKELETAL:   Skin: Examination patient has blistering and necrotic skin on the plantar aspect of the left foot.  Patient has a strong palpable dorsalis pedis pulse.  Review of the MRI scan does not show any bony involvement but does show an abscess involving the soft tissue of the plantar aspect of the left foot.  Ankle-brachial indices were normal on January 2023.  MRI scan is also suggestive of a possible osteomyelitis of the first metatarsal head.  Patient's hemoglobin A1c in January was 12.0.  Sed rate 65.  White cell count 19,000 and a hemoglobin of 10.5.  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  Plan: Plan: We will plan for extensive excisional debridement of the plantar soft tissue.  May require an amputation of the first ray.  Risk and benefits were discussed including persistent infection need for additional surgery potential for higher level amputation.  Patient states he understands wished to proceed with foot salvage intervention.  Thank you for the consult and the opportunity to see Mr. [name], [place] [phone] 8:27 AM

## 2022-05-05 NOTE — Assessment & Plan Note (Addendum)
hemoglobin A1c 11.5 (05/07/2022 ) -Patient was placed on Semglee and subsequently changed to home regimen 70/30 and dose increased to 30 units twice daily.   -No coverage NovoLog was discontinued.   -Outpatient follow-up with PCP.

## 2022-05-05 NOTE — Anesthesia Postprocedure Evaluation (Signed)
Anesthesia Post Note  Patient: [name]  Procedure(s) Performed: LEFT FOOT DEBRIDEMENT (Left)     Patient location during evaluation: PACU Anesthesia Type: General Level of consciousness: awake and alert and oriented Pain management: pain level controlled Vital Signs Assessment: post-procedure vital signs reviewed and stable Respiratory status: spontaneous breathing, nonlabored ventilation and respiratory function stable Cardiovascular status: blood pressure returned to baseline and stable Postop Assessment: no apparent nausea or vomiting Anesthetic complications: no   No notable events documented.  Last Vitals:  Vitals:   05/05/22 1245 05/05/22 1300  BP: 114/72 108/80  Pulse: (!) 10 (!) 10  Resp: 20 18  Temp:    SpO2: 98% 98%    Last Pain:  Vitals:   05/05/22 1245  TempSrc:   PainSc: Asleep                 Samauri Kellenberger A.

## 2022-05-05 NOTE — Anesthesia Procedure Notes (Signed)
Procedure Name: LMA Insertion Date/Time: 05/05/2022 11:46 AM Performed by: Macie Burows, CRNA Pre-anesthesia Checklist: Patient identified, Emergency Drugs available, Suction available, Patient being monitored and Timeout performed Patient Re-evaluated:Patient Re-evaluated prior to induction Oxygen Delivery Method: Circle system utilized Preoxygenation: Pre-oxygenation with 100% oxygen Induction Type: IV induction Ventilation: Mask ventilation without difficulty LMA: LMA inserted LMA Size: 5.0 Placement Confirmation: positive ETCO2 and breath sounds checked- equal and bilateral Tube secured with: Tape

## 2022-05-05 NOTE — Hospital Course (Addendum)
[name] is [name] 56 y.o. male with medical history significant of left foot diabetic ulcer, IDDM, came with left foot pain and abscess.  S/p debridement by Dr. Sharol Given on 6/2.  Recommending amputation, patient not interested at this time.  Id c/s for abx recommendations.  Will need IV abx for home and wound center referral.

## 2022-05-05 NOTE — Anesthesia Preprocedure Evaluation (Signed)
Anesthesia Evaluation  Patient identified by MRN, date of birth, ID band Patient awake    Reviewed: Allergy & Precautions, NPO status , Patient's Chart, lab work & pertinent test results  Airway Mallampati: II  TM Distance: >3 FB Neck ROM: Full    Dental  (+) Poor Dentition, Missing, Dental Advisory Given   Pulmonary Current Smoker and Patient abstained from smoking.,    Pulmonary exam normal breath sounds clear to auscultation       Cardiovascular Normal cardiovascular exam Rhythm:Regular Rate:Normal     Neuro/Psych negative neurological ROS  negative psych ROS   GI/Hepatic negative GI ROS, Neg liver ROS, (+)     substance abuse  cocaine use,   Endo/Other  diabetes, Poorly Controlled, Type 2, Insulin Dependent  Renal/GU Renal InsufficiencyRenal disease  negative genitourinary   Musculoskeletal Left foot abscess   Abdominal (+) + obese,   Peds  Hematology  (+) Blood dyscrasia, anemia ,   Anesthesia Other Findings   Reproductive/Obstetrics                             Anesthesia Physical Anesthesia Plan  ASA: 3  Anesthesia Plan: General   Post-op Pain Management: Dilaudid IV and Precedex   Induction:   PONV Risk Score and Plan: 2 and Treatment may vary due to age or medical condition and Ondansetron  Airway Management Planned: LMA  Additional Equipment: None  Intra-op Plan:   Post-operative Plan: Extubation in OR  Informed Consent: I have reviewed the patients History and Physical, chart, labs and discussed the procedure including the risks, benefits and alternatives for the proposed anesthesia with the patient or authorized representative who has indicated his/her understanding and acceptance.     Dental advisory given  Plan Discussed with: CRNA and Anesthesiologist  Anesthesia Plan Comments:         Anesthesia Quick Evaluation

## 2022-05-05 NOTE — Assessment & Plan Note (Signed)
noted 

## 2022-05-05 NOTE — Assessment & Plan Note (Addendum)
Baseline creatinine 1.1 Elevated to 1.67 on presentation improved with hydration. -Saline lock IV fluids. Renal US without hydro ->thickening of bladder wall, will follow UA (bland) Related to NSAIDs? -NSAIDs discontinued on discharge. -Acute kidney injury had resolved by day of discharge with creatinine at 1.06. -Outpatient follow-up with PCP.

## 2022-05-05 NOTE — Op Note (Signed)
05/04/2022 - 05/05/2022  12:05 PM  PATIENT:  [name]    PRE-OPERATIVE DIAGNOSIS:  Abscess Left Foot  POST-OPERATIVE DIAGNOSIS:  Same  PROCEDURE:  LEFT FOOT DEBRIDEMENT  SURGEON:  Nadara Mustard, MD  PHYSICIAN ASSISTANT:None ANESTHESIA:   General  PREOPERATIVE INDICATIONS:  [name] is a  56 y.o. male with a diagnosis of Abscess Left Foot who failed conservative measures and elected for surgical management.    The risks benefits and alternatives were discussed with the patient preoperatively including but not limited to the risks of infection, bleeding, nerve injury, cardiopulmonary complications, the need for revision surgery, among others, and the patient was willing to proceed.  OPERATIVE IMPLANTS: None  @ENCIMAGES @  OPERATIVE FINDINGS: Patient had extensive necrotic skin and soft tissue and muscle.  There is no soft tissue coverage of the metatarsal heads.  Tissue sent for cultures.  Patient does not have foot salvage intervention options and will require a below-knee amputation.  OPERATIVE PROCEDURE: Patient was brought the operating room and underwent a general anesthetic.  After adequate levels anesthesia were obtained patient's left lower extremity was prepped using DuraPrep draped into a sterile field a timeout was called.  A longitudinal elliptical incision was made on the plantar aspect of the left foot.  There is extensive necrotic foul-smelling tissue with purulent drainage involving the entire plantar aspect of the foot.  After surgical excision with a 21 blade knife the metatarsal heads had no soft tissue coverage the necrotic tissue extended between the metatarsals and extended to the heel.  The wound was irrigated with normal saline electrocautery was used for hemostasis the tissue was sent for cultures.  The wound was packed open with Kerlix patient was extubated taken the PACU in stable condition.  Postdebridement wound measured 10 x 15 cm.   DISCHARGE  PLANNING:  Antibiotic duration: Continue IV antibiotics adjust according to tissue cultures.  Weightbearing: Weightbearing as tolerated on the left foot.  Pain medication: Opioid pathway  Dressing care/ Wound VAC: Reinforce dressing as needed.  Bleeding is expected  Ambulatory devices: Walker or crutches  Discharge to: Anticipate return to the operating room for below-knee amputation.    I will discuss the clinical findings with the patient tomorrow morning.  Follow-up: In the office 1 week post operative.

## 2022-05-05 NOTE — Progress Notes (Signed)
PROGRESS NOTE    [name]  [id number] DOB: November 14, 1966 DOA: 05/04/2022 PCP: Patient, No Pcp Per (Inactive)  Chief Complaint  Patient presents with   Foot Pain    Brief Narrative:  [name] is [name] 56 y.o. male with medical history significant of left foot diabetic ulcer, IDDM, came with left foot pain and abscess.   Patient had [name] chronic left-sided diabetic foot ulcer, initially infected in January, MRI at that point showed only soft tissue infection and patient received treatment of p.o. antibiotics.  Symptoms has been stable, no pain or enlargement of the wound until about 1 week ago, patient ran out of his insulins and started to have left-sided foot pain.  The pain was 2-3/10, but last 2 days has become unbearable.  Last night he had to take [name] total of 200 mg x 8 of ibuprofen and 4 Tylenol to control the pain.  Denies any fever.  For last 2 days, he also noticed thick pus coming out of the wound associated with full smell.  No fever or chills.   ED Course: Patient was found tachycardia blood pressure borderline but afebrile.  Elevated WBC count, creatinine 1.6 compared to baseline 1.1-1.3.   X-ray compatible with left lateral forefoot soft tissue ulceration suspicious for foreign body with extensive subcu cutaneous emphysema.   Vancomycin and cefepime started in the ED.    Assessment & Plan:   Principal Problem:   Diabetic foot ulcer ([place]) Active Problems:   AKI (acute kidney injury) ([place])   Type 2 diabetes mellitus ([place])   Obesity (BMI 30-39.9)   Cellulitis and abscess of foot   Assessment and Plan: * Diabetic foot ulcer ([place]) MRI L foot with nonspecific low grade edema and enhancement along plantar side of head of 5th metatarsal - arthropathy vs early osteo Extensive gas in plantar soft tissues - extension to involve the plantar portion of flexor digitorum brevis muscle indicative of myositis and gas producing soft tissue infection Blood cultures  pending Aerobic/anaerobic cx pending Follow operative cultures CRP 27.3, sed rate 65 Now s/p left foot debridement by [name], pt had extensive necrotic skin and soft tissue and muscle.  No soft tissue coverage of the meta tarsal heads.  Per [name], patient does not have foot salvage intervention options and will require [name] BKA. [name] anticipates return to OR for BKA.  Will continue abx and adjust as warranted for cx results.   AKI (acute kidney injury) ([place]) Baseline creatinine 1.1 Elevated to 1.67 on presentation Improving with IVF Trend Related to NSAIDs?  Type 2 diabetes mellitus ([facility]) SSI Adjust as needed H1ac pending  Obesity (BMI 30-39.9) noted     DVT prophylaxis: lovenox Code Status: full Family Communication: none Disposition:   Status is: Inpatient Remains inpatient appropriate because: need for IV abx   Consultants:  ortho  Procedures:  Left foot debribement  Antimicrobials:  Anti-infectives (From admission, onward)    Start     Dose/Rate Route Frequency Ordered Stop   05/05/22 1800  vancomycin (VANCOREADY) IVPB 1250 mg/250 mL        1,250 mg 166.7 mL/hr over 90 Minutes Intravenous Every 24 hours 05/04/22 1906     05/05/22 0600  ceFAZolin (ANCEF) IVPB 2g/100 mL premix        2 g 200 mL/hr over 30 Minutes Intravenous On call to O.R. 05/04/22 2123 05/05/22 1152   05/04/22 2300  piperacillin-tazobactam (ZOSYN) IVPB 3.375 g        3.375  g 12.5 mL/hr over 240 Minutes Intravenous Every 8 hours 05/04/22 1729     05/04/22 1500  ceFEPIme (MAXIPIME) 2 g in sodium chloride 0.9 % 100 mL IVPB  Status:  Discontinued        2 g 200 mL/hr over 30 Minutes Intravenous Every 8 hours 05/04/22 1450 05/04/22 1723   05/04/22 1500  vancomycin (VANCOREADY) IVPB 2000 mg/400 mL        2,000 mg 200 mL/hr over 120 Minutes Intravenous  Once 05/04/22 1450 05/04/22 2053       Subjective: No new complaints  Objective: Vitals:   05/05/22 1300 05/05/22 1315 05/05/22  1356 05/05/22 1637  BP: 108/80 119/74 119/72 119/81  Pulse: (!) 10 77 85 87  Resp: 18 13 18 16   Temp:  98.2 F (36.8 C) 97.9 F (36.6 C) 98.3 F (36.8 C)  TempSrc:   Oral Oral  SpO2: 98% 97% 100% 97%  Weight:      Height:        Intake/Output Summary (Last 24 hours) at 05/05/2022 1805 Last data filed at 05/05/2022 0536 Gross per 24 hour  Intake 1733.34 ml  Output 200 ml  Net 1533.34 ml   Filed Weights   05/04/22 1332 05/04/22 2132 05/05/22 0950  Weight: 106.6 kg 100.9 kg 100.9 kg    Examination:  General exam: Appears calm and comfortable  Respiratory system: unlabored Cardiovascular system: RRR Gastrointestinal system: Abdomen is nondistended, soft and nontender. Central nervous system: Alert and oriented. No focal neurological deficits. Extremities: L foot intact dressing    Data Reviewed: I have personally reviewed following labs and imaging studies  CBC: Recent Labs  Lab 05/04/22 1346 05/05/22 0622  WBC 19.4* 19.0*  HGB 11.4* 10.5*  HCT 35.9* 31.7*  MCV 84.5 82.8  PLT 304 [id number]    Basic Metabolic Panel: Recent Labs  Lab 05/04/22 1346 05/05/22 0622  NA 131* 137  K 4.1 3.8  CL 96* 105  CO2 26 25  GLUCOSE 455* 145*  BUN 21* 20  CREATININE 1.67* 1.43*  CALCIUM 8.5* 8.4*    GFR: Estimated Creatinine Clearance: 68.4 mL/min ([name]) (by C-G formula based on SCr of 1.43 mg/dL (H)).  Liver Function Tests: No results for input(s): AST, ALT, ALKPHOS, BILITOT, PROT, ALBUMIN in the last 168 hours.  CBG: Recent Labs  Lab 05/05/22 0639 05/05/22 0750 05/05/22 0948 05/05/22 1222 05/05/22 1639  GLUCAP 150* 162* 162* 146* 400*     Recent Results (from the past 240 hour(s))  Blood culture (routine x 2)     Status: None (Preliminary result)   Collection Time: 05/04/22  2:44 PM   Specimen: BLOOD  Result Value Ref Range Status   Specimen Description BLOOD SITE NOT SPECIFIED  Final   Special Requests   Final    BOTTLES DRAWN AEROBIC AND ANAEROBIC Blood  Culture adequate volume   Culture   Final    NO GROWTH < 24 HOURS Performed at [facility], [place] [address] [place], [place] [address]    Report Status PENDING  Incomplete  Blood culture (routine x 2)     Status: None (Preliminary result)   Collection Time: 05/04/22  2:55 PM   Specimen: BLOOD LEFT ARM  Result Value Ref Range Status   Specimen Description BLOOD LEFT ARM  Final   Special Requests   Final    BOTTLES DRAWN AEROBIC AND ANAEROBIC Blood Culture adequate volume   Culture   Final    NO GROWTH < 24  HOURS Performed at [facility], [place] [address], [place], [place] [address]    Report Status PENDING  Incomplete  Aerobic/Anaerobic Culture w Gram Stain (surgical/deep wound)     Status: None (Preliminary result)   Collection Time: 05/04/22  3:25 PM   Specimen: Foot  Result Value Ref Range Status   Specimen Description FOOT  Final   Special Requests NONE  Final   Gram Stain PENDING  Incomplete   Culture   Final    CULTURE REINCUBATED FOR BETTER GROWTH Performed at [facility], [address] [place], [place] [address]    Report Status PENDING  Incomplete  Surgical PCR screen     Status: Abnormal   Collection Time: 05/05/22 12:58 AM   Specimen: Nasal Mucosa; Nasal Swab  Result Value Ref Range Status   MRSA, PCR POSITIVE ([name]) NEGATIVE Final    Comment: RESULT CALLED TO, READ BACK BY AND VERIFIED WITH: RN LANGSTON O. 05/05/22@2 :26 BY TW    Staphylococcus aureus POSITIVE ([name]) NEGATIVE Final    Comment: (NOTE) The Xpert SA Assay (FDA approved for NASAL specimens in patients 42 years of age and older), is one component of [name] comprehensive surveillance program. It is not intended to diagnose infection nor to guide or monitor treatment. Performed at [facility], [place] [address] [place], [place] [address]          Radiology Studies: MR FOOT LEFT W WO CONTRAST  Result Date: 05/05/2022 CLINICAL DATA:  Diabetic foot swelling.  Ulcer. EXAM: MRI OF THE  LEFT FOOT WITHOUT AND WITH CONTRAST TECHNIQUE: Multiplanar, multisequence MR imaging of the left foot was performed both before and after administration of intravenous contrast. Osteomyelitis protocol MRI of the foot was obtained, to include the entire foot and ankle. This protocol uses Isair Inabinet large field of view to cover the entire foot and ankle, and is suitable for assessing bony structures for osteomyelitis. Due to the large field of view and imaging plane choice, this protocol is less sensitive for assessing small structures such as ligamentous structures of the foot and ankle, compared to Demeshia Sherburne dedicated forefoot or dedicated hindfoot exam. CONTRAST:  14mL GADAVIST GADOBUTROL 1 MMOL/ML IV SOLN COMPARISON:  12/14/2021 and radiographs 05/04/2022 FINDINGS: Osteomyelitis protocol MRI of the foot was obtained, to include the entire foot and ankle. This protocol uses [name] large field of view to cover the entire foot and ankle, and is suitable for assessing bony structures for osteomyelitis. Due to the large field of view and imaging plane choice, this protocol is less sensitive for assessing small structures such as ligamentous structures of the foot and ankle, compared to [name] dedicated forefoot or dedicated hindfoot exam. Bones/Joint/Cartilage Subtle accentuated edema and enhancement along the plantar side of the fifth metatarsal, nonspecific for arthropathy versus early osteomyelitis. Ligaments Lisfranc ligament intact. Muscles and Tendons Diffuse low-level edema in the regional musculature, possibly neurogenic. Abnormal gas tracks along the plantar margin of the flexor digitorum brevis muscle as shown on image 45 series 16, compatible with infection and myositis. Soft tissues Extensive subcutaneous gas along the plantar foot especially medially as on image 34 series 16. This tracks from about the level of the Chopart joint to the base of the first through fourth toes. Small amount of artifact associated with the ulceration  along the plantar base of the fifth metatarsal with localized edema and infiltration of tissue in this vicinity. The most part the gas appears multilocular rather than confluent in [name] single  cavity, and no well-defined drainable abscess is observed. Dorsal subcutaneous edema and enhancement noted in the foot compatible with cellulitis. IMPRESSION: 1. Nonspecific low-grade edema and enhancement along the plantar side of the head of the fifth metatarsal. This is nonspecific for arthropathy versus early osteomyelitis although the proximity to ulceration and localized subcutaneous infiltration tends to favor early osteomyelitis. 2. Extensive gas in the plantar soft tissues, primarily in the subcutaneous tissues extending from about the level of the Chopart joint through the ball of the foot, but also with some extension to involve the plantar portion of the flexor digitorum brevis muscle, indicative of myositis and gas producing soft tissue infection. Electronically Signed   By: Matthew Clines M.D.   On: 05/05/2022 07:47   DG Foot Complete Left  Result Date: 05/04/2022 CLINICAL DATA:  Pain in lateral foot EXAM: LEFT FOOT - COMPLETE 3+ VIEW COMPARISON:  12/14/2021 FINDINGS: Ulceration noted in the lateral forefoot. There are 2 small hyperdensities in the region of ulceration which may be foreign body or topical medication. Extensive subcutaneous emphysema of the palmar 4 and midfoot. Enthesopathic changes are seen at the insertion of the plantar fascia and Achilles tendon on the calcaneus. IMPRESSION: Lateral forefoot soft tissue ulceration containing small hyperdensities which may be foreign body or medication. Please correlate with examination. Extensive subcutaneous emphysema of the plantar mid and forefoot. No osseous erosions are seen to indicate osteomyelitis. Electronically Signed   By: Matthew Roux M.D.   On: 05/04/2022 14:09        Scheduled Meds:  acidophilus  2 capsule Oral Daily   Chlorhexidine  Gluconate Cloth  6 each Topical Q0600   docusate sodium  100 mg Oral BID   enoxaparin (LOVENOX) injection  50 mg Subcutaneous Q24H   fentaNYL       insulin aspart       insulin aspart  0-15 Units Subcutaneous TID WC   mupirocin ointment  1 application. Nasal BID   Continuous Infusions:  sodium chloride 10 mL/hr at 05/05/22 1432   methocarbamol (ROBAXIN) IV     piperacillin-tazobactam (ZOSYN)  IV 3.375 g (05/05/22 1433)   vancomycin       LOS: 1 day    Time spent: over 30 min    Matthew Helper, MD [facility]   To contact the attending provider between 7A-7P or the covering provider during after hours 7P-7A, please log into the web site [email or web address] and access using universal [place] password for that web site. If you do not have the password, please call the hospital operator.  05/05/2022, 6:05 PM

## 2022-05-05 NOTE — Transfer of Care (Signed)
Immediate Anesthesia Transfer of Care Note  Patient: [name]  Procedure(s) Performed: LEFT FOOT DEBRIDEMENT (Left)  Patient Location: PACU  Anesthesia Type:General  Level of Consciousness: delete  Airway & Oxygen Therapy: Patient Spontanous Breathing and Patient connected to face mask oxygen  Post-op Assessment: Report given to RN and Post -op Vital signs reviewed and stable  Post vital signs: Reviewed and stable  Last Vitals:  Vitals Value Taken Time  BP    Temp    Pulse    Resp    SpO2      Last Pain:  Vitals:   05/05/22 0959  TempSrc:   PainSc: 8          Complications: No notable events documented.

## 2022-05-05 NOTE — Assessment & Plan Note (Addendum)
MRI L foot with nonspecific low grade edema and enhancement along plantar side of head of 5th metatarsal - arthropathy vs early osteo Extensive gas in plantar soft tissues - extension to involve the plantar portion of flexor digitorum brevis muscle indicative of myositis and gas producing soft tissue infection Blood cultures with no growth to date x5 days. Aerobic/anaerobic cx MRSA, strep gallolyticus, klebsiella oxytoca,  Operative cultures -> klebsiella oxytoca, strep gallolyticus, strep gordonii, MRSA CRP 26.4, sed rate 107 Leukocytosis improved Now s/p left foot debridement by Dr. Sharol Given, pt had extensive necrotic skin and soft tissue and muscle.  No soft tissue coverage of the meta tarsal heads.  Patient refused amputation recommended by Dr. Sharol Given. ID c/s for abx recommendations Discussed wound care with Dr. Sharol Given (Patient can wash his foot with soap and water apply a dry dressing, minimize weightbearing.  Ideally nonweightbearing.) -Patient will be set up for the wound care center. -IV antibiotics narrowed down to cefazolin and metronidazole and Levaquin added per ID. -Vancomycin discontinued per ID as only foot swab grew MRSA. -Patient will need 6 weeks IV antibiotics which has been arranged with home health, with ID follow-up. -PICC line placed and patient will be discharged in stable and improved condition.

## 2022-05-05 NOTE — Progress Notes (Signed)
Pt has been NpPO since midnight. Pain controled with IV dilaudid. Hibiclens bath done. Positive for MRSA in nares, Bacitracin ointment started. CBG checked and insulin given. Surgical consent signed. Awaiting call to OTR.

## 2022-05-05 NOTE — Plan of Care (Signed)

## 2022-05-06 ENCOUNTER — Encounter (HOSPITAL_COMMUNITY): Payer: Self-pay | Admitting: Orthopedic Surgery

## 2022-05-06 ENCOUNTER — Inpatient Hospital Stay (HOSPITAL_COMMUNITY): Payer: Self-pay

## 2022-05-06 DIAGNOSIS — E875 Hyperkalemia: Secondary | ICD-10-CM

## 2022-05-06 DIAGNOSIS — Z720 Tobacco use: Secondary | ICD-10-CM

## 2022-05-06 LAB — MAGNESIUM: Magnesium: 2.1 mg/dL (ref 1.7–2.4)

## 2022-05-06 LAB — COMPREHENSIVE METABOLIC PANEL
ALT: 9 U/L (ref 0–44)
AST: 11 U/L — ABNORMAL LOW (ref 15–41)
Albumin: 2.4 g/dL — ABNORMAL LOW (ref 3.5–5.0)
Alkaline Phosphatase: 81 U/L (ref 38–126)
Anion gap: 8 (ref 5–15)
BUN: 30 mg/dL — ABNORMAL HIGH (ref 6–20)
CO2: 24 mmol/L (ref 22–32)
Calcium: 8.6 mg/dL — ABNORMAL LOW (ref 8.9–10.3)
Chloride: 99 mmol/L (ref 98–111)
Creatinine, Ser: 1.43 mg/dL — ABNORMAL HIGH (ref 0.61–1.24)
GFR, Estimated: 58 mL/min — ABNORMAL LOW (ref >60)
Glucose, Bld: 441 mg/dL — ABNORMAL HIGH (ref 70–99)
Potassium: 5.2 mmol/L — ABNORMAL HIGH (ref 3.5–5.1)
Sodium: 131 mmol/L — ABNORMAL LOW (ref 135–145)
Total Bilirubin: 0.2 mg/dL — ABNORMAL LOW (ref 0.3–1.2)
Total Protein: 6.4 g/dL — ABNORMAL LOW (ref 6.5–8.1)

## 2022-05-06 LAB — CBC WITH DIFFERENTIAL/PLATELET
Abs Immature Granulocytes: 0.3 10*3/uL — ABNORMAL HIGH (ref 0.00–0.07)
Basophils Absolute: 0 10*3/uL (ref 0.0–0.1)
Basophils Relative: 0 %
Eosinophils Absolute: 0 10*3/uL (ref 0.0–0.5)
Eosinophils Relative: 0 %
HCT: 32.3 % — ABNORMAL LOW (ref 39.0–52.0)
Hemoglobin: 10.2 g/dL — ABNORMAL LOW (ref 13.0–17.0)
Immature Granulocytes: 1 %
Lymphocytes Relative: 4 %
Lymphs Abs: 0.9 10*3/uL (ref 0.7–4.0)
MCH: 26.6 pg (ref 26.0–34.0)
MCHC: 31.6 g/dL (ref 30.0–36.0)
MCV: 84.1 fL (ref 80.0–100.0)
Monocytes Absolute: 0.6 10*3/uL (ref 0.1–1.0)
Monocytes Relative: 3 %
Neutro Abs: 19.7 10*3/uL — ABNORMAL HIGH (ref 1.7–7.7)
Neutrophils Relative %: 92 %
Platelets: 281 10*3/uL (ref 150–400)
RBC: 3.84 MIL/uL — ABNORMAL LOW (ref 4.22–5.81)
RDW: 13.7 % (ref 11.5–15.5)
WBC: 21.5 10*3/uL — ABNORMAL HIGH (ref 4.0–10.5)
nRBC: 0 % (ref 0.0–0.2)

## 2022-05-06 LAB — URINALYSIS, ROUTINE W REFLEX MICROSCOPIC
Bacteria, UA: NONE SEEN
Bilirubin Urine: NEGATIVE
Glucose, UA: >=500 mg/dL — AB
Hgb urine dipstick: NEGATIVE
Ketones, ur: NEGATIVE mg/dL
Leukocytes,Ua: NEGATIVE
Nitrite: NEGATIVE
Protein, ur: NEGATIVE mg/dL
Specific Gravity, Urine: 1.024 (ref 1.005–1.030)
pH: 5 (ref 5.0–8.0)

## 2022-05-06 LAB — BASIC METABOLIC PANEL
Anion gap: 9 (ref 5–15)
BUN: 32 mg/dL — ABNORMAL HIGH (ref 6–20)
CO2: 22 mmol/L (ref 22–32)
Calcium: 8.2 mg/dL — ABNORMAL LOW (ref 8.9–10.3)
Chloride: 99 mmol/L (ref 98–111)
Creatinine, Ser: 1.34 mg/dL — ABNORMAL HIGH (ref 0.61–1.24)
GFR, Estimated: >60 mL/min (ref >60)
Glucose, Bld: 413 mg/dL — ABNORMAL HIGH (ref 70–99)
Potassium: 4.4 mmol/L (ref 3.5–5.1)
Sodium: 130 mmol/L — ABNORMAL LOW (ref 135–145)

## 2022-05-06 LAB — GLUCOSE, CAPILLARY
Glucose-Capillary: 482 mg/dL — ABNORMAL HIGH (ref 70–99)
Glucose-Capillary: 343 mg/dL — ABNORMAL HIGH (ref 70–99)
Glucose-Capillary: 393 mg/dL — ABNORMAL HIGH (ref 70–99)
Glucose-Capillary: 106 mg/dL — ABNORMAL HIGH (ref 70–99)

## 2022-05-06 LAB — C-REACTIVE PROTEIN: CRP: 26.4 mg/dL — ABNORMAL HIGH (ref <1.0)

## 2022-05-06 LAB — PHOSPHORUS: Phosphorus: 2.8 mg/dL (ref 2.5–4.6)

## 2022-05-06 LAB — SEDIMENTATION RATE: Sed Rate: 107 mm/hr — ABNORMAL HIGH (ref 0–16)

## 2022-05-06 MED ORDER — INSULIN DETEMIR 100 UNIT/ML ~~LOC~~ SOLN
10.0000 [IU] | Freq: Two times a day (BID) | SUBCUTANEOUS | Status: DC
Start: 2022-05-06 — End: 2022-05-09
  Administered 2022-05-06 – 2022-05-09 (×7): 10 [IU] via SUBCUTANEOUS
  Filled 2022-05-06 (×8): qty 0.1

## 2022-05-06 MED ORDER — NICOTINE POLACRILEX 2 MG MT GUM: 2.0000 mg | CHEWING_GUM | OROMUCOSAL | Status: DC | PRN

## 2022-05-06 MED ORDER — INSULIN ASPART 100 UNIT/ML IJ SOLN
0.0000 [IU] | Freq: Every day | INTRAMUSCULAR | Status: DC
  Administered 2022-05-09: 2 [IU] via SUBCUTANEOUS

## 2022-05-06 MED ORDER — INSULIN ASPART 100 UNIT/ML IJ SOLN
0.0000 [IU] | Freq: Three times a day (TID) | INTRAMUSCULAR | Status: DC
  Administered 2022-05-06: 20 [IU] via SUBCUTANEOUS
  Administered 2022-05-06: 15 [IU] via SUBCUTANEOUS
  Administered 2022-05-07: 4 [IU] via SUBCUTANEOUS
  Administered 2022-05-07 (×2): 3 [IU] via SUBCUTANEOUS
  Administered 2022-05-08: 15 [IU] via SUBCUTANEOUS
  Administered 2022-05-08: 11 [IU] via SUBCUTANEOUS
  Administered 2022-05-09: 15 [IU] via SUBCUTANEOUS
  Administered 2022-05-09: 3 [IU] via SUBCUTANEOUS
  Administered 2022-05-09: 7 [IU] via SUBCUTANEOUS
  Administered 2022-05-10: 11 [IU] via SUBCUTANEOUS
  Administered 2022-05-10: 15 [IU] via SUBCUTANEOUS
  Administered 2022-05-11: 4 [IU] via SUBCUTANEOUS
  Administered 2022-05-11: 3 [IU] via SUBCUTANEOUS
  Administered 2022-05-11: 11 [IU] via SUBCUTANEOUS

## 2022-05-06 MED ORDER — SODIUM CHLORIDE 0.9 % IV SOLN
2.0000 g | Freq: Three times a day (TID) | INTRAVENOUS | Status: DC
  Administered 2022-05-06 – 2022-05-08 (×6): 2 g via INTRAVENOUS
  Filled 2022-05-06 (×6): qty 12.5

## 2022-05-06 MED ORDER — NICOTINE 21 MG/24HR TD PT24
21.0000 mg | MEDICATED_PATCH | Freq: Every day | TRANSDERMAL | Status: DC
Start: 2022-05-06 — End: 2022-05-11
  Administered 2022-05-06 – 2022-05-11 (×4): 21 mg via TRANSDERMAL
  Filled 2022-05-06 (×6): qty 1

## 2022-05-06 MED ORDER — INSULIN ASPART 100 UNIT/ML IJ SOLN
5.0000 [IU] | Freq: Three times a day (TID) | INTRAMUSCULAR | Status: DC
  Administered 2022-05-06 – 2022-05-10 (×12): 5 [IU] via SUBCUTANEOUS

## 2022-05-06 MED ORDER — METRONIDAZOLE 500 MG PO TABS
500.0000 mg | ORAL_TABLET | Freq: Two times a day (BID) | ORAL | Status: DC
  Administered 2022-05-06 – 2022-05-11 (×11): 500 mg via ORAL
  Filled 2022-05-06 (×11): qty 1

## 2022-05-06 MED ORDER — SODIUM CHLORIDE 0.9 % IV SOLN: INTRAVENOUS | Status: DC

## 2022-05-06 NOTE — Progress Notes (Signed)
Pt is a smoker, nicotine patch provided. After news of possible amputation pt is adamant about smoking a cigarette.. educated on dangers of smoking, will not take no for an answer at this time, leaving the unit to smoke. IV discontinued until arrival back on unit. consulting provider made aware

## 2022-05-06 NOTE — Progress Notes (Signed)
Patient ID: [name], male   DOB: 24-Jul-1966, 56 y.o.   MRN: [id number] Patient is postoperative day 1 debridement of massive abscess plantar aspect of the left foot.  Patient had skin and soft tissue and muscle necrosis with purulent abscess involving entire plantar aspect of the foot including the intrinsic muscles.  There is no soft tissue coverage that was viable over the metatarsals and all metatarsals are exposed.  Have recommended proceeding with a below-knee amputation on Wednesday.  Patient would like to discuss this further with his brother 730 Monday morning.  I will meet with the patient and his brother Monday morning.

## 2022-05-06 NOTE — Assessment & Plan Note (Addendum)
Tobacco cessation stressed to patient.   -Patient maintained on nicotine patch, gum.   -Does not seem ready to quit.   -Outpatient follow-up.

## 2022-05-06 NOTE — Progress Notes (Signed)
PT Cancellation Note  Patient Details Name: [name] MRN: [id number] DOB: 02-07-1966   Cancelled Treatment:    Reason Eval/Treat Not Completed: PT screened, no needs identified, will sign off.  Pt reports he is having no difficulty with mobility at this time and has been up ambulating in the room. He is not interested in PT at this time.  We discussed if he did undergo amputation then PT would likely be reordered. Will sign off, please reorder if needed.     Melvern Banker 05/06/2022, 1:43 PM Lavonia Dana, [place]  Office [phone] 05/06/2022

## 2022-05-06 NOTE — Assessment & Plan Note (Addendum)
resolved 

## 2022-05-06 NOTE — Progress Notes (Signed)
Pharmacy Antibiotic Note- follow-up  [name] is a 56 y.o. male for which pharmacy has been consulted for cefepime and vancomycin dosing for  DFI .  Patient with a history of  left foot diabetic ulcer, IDDM. Patient presenting with left foot pain and pus coming out.  SCr 1.67 > 1.43 - continues to stay above baseline  Plan: DC zosyn Start flagyl 500 mg po q12h Start cefepime 2 grams iv q8h Continue Vancomycin 1250 mg q24hr (eAUC 511.1)  Plan a vanco peak and trough assessment with the 5th dose (05/08/2022) Trend WBC, Fever, Renal function, & Clinical course F/u cultures, clinical course, WBC, fever De-escalate when able  Height: 5\' 9"  (175.3 cm) Weight: 100.9 kg (222 lb 7.1 oz) IBW/kg (Calculated) : 70.7  Temp (24hrs), Avg:98 F (36.7 C), Min:97.6 F (36.4 C), Max:98.3 F (36.8 C)  Recent Labs  Lab 05/04/22 1346 05/04/22 1506 05/04/22 1549 05/05/22 0622 05/06/22 0220  WBC 19.4*  --   --  19.0* 21.5*  CREATININE 1.67*  --   --  1.43* 1.43*  LATICACIDVEN  --  1.6 1.2  --   --      Estimated Creatinine Clearance: 68.4 mL/min (A) (by C-G formula based on SCr of 1.43 mg/dL (H)).    No Known Allergies  Antimicrobials this admission: Cefepime x 1 in ED vancomycin 6/1 >>  zosyn 6/1 >> 6/3 Cefepime 6/3 >> Flagyl 6/3 >>  Microbiology results: 6/1 BCx - ngtd [D3] 6/2 surgical wound cx - abundant GPC, GNR; rare GPC; rare WBC [both [PMN / Mononuclear] 6/2 surgical PCR - positive MRSA  Thank you for allowing pharmacy to be a part of this patient's care.  Vaughan Basta BS, PharmD, BCPS Clinical Pharmacist 05/06/2022 9:21 AM  Contact: [phone] after 3 PM  "Be curious, not judgmental..." -Jamal Maes

## 2022-05-06 NOTE — Progress Notes (Addendum)
PROGRESS NOTE    [name]  [id number] DOB: 1966/03/26 DOA: 05/04/2022 PCP: Patient, No Pcp Per (Inactive)  Chief Complaint  Patient presents with   Foot Pain    Brief Narrative:  [name] is [name] 56 y.o. male with medical history significant of left foot diabetic ulcer, IDDM, came with left foot pain and abscess.   Patient had [name] chronic left-sided diabetic foot ulcer, initially infected in January, MRI at that point showed only soft tissue infection and patient received treatment of p.o. antibiotics.  Symptoms has been stable, no pain or enlargement of the wound until about 1 week ago, patient ran out of his insulins and started to have left-sided foot pain.  The pain was 2-3/10, but last 2 days has become unbearable.  Last night he had to take [name] total of 200 mg x 8 of ibuprofen and 4 Tylenol to control the pain.  Denies any fever.  For last 2 days, he also noticed thick pus coming out of the wound associated with full smell.  No fever or chills.   ED Course: Patient was found tachycardia blood pressure borderline but afebrile.  Elevated WBC count, creatinine 1.6 compared to baseline 1.1-1.3.   X-ray compatible with left lateral forefoot soft tissue ulceration suspicious for foreign body with extensive subcu cutaneous emphysema.   Vancomycin and cefepime started in the ED.    Assessment & Plan:   Principal Problem:   Diabetic foot ulcer ([name]) Active Problems:   AKI (acute kidney injury) ([name])   Type 2 diabetes mellitus ([name])   Obesity (BMI 30-39.9)   Tobacco abuse   Hyperkalemia   Cellulitis and abscess of foot   Assessment and Plan: * Diabetic foot ulcer ([name]) MRI L foot with nonspecific low grade edema and enhancement along plantar side of head of 5th metatarsal - arthropathy vs early osteo Extensive gas in plantar soft tissues - extension to involve the plantar portion of flexor digitorum brevis muscle indicative of myositis and gas producing soft tissue infection Blood  cultures pending Aerobic/anaerobic cx rare staph aureus, rare klebsiella oxytoca Follow operative cultures -> klebsiella oxytoca, strep gallolyticus, gram positive cocci, gram negative rods CRP 26.4, sed rate 107 Now s/p left foot debridement by Dr. Sharol Given, pt had extensive necrotic skin and soft tissue and muscle.  No soft tissue coverage of the meta tarsal heads.  Per Dr. Sharol Given, patient does not have foot salvage intervention options and will require [name] BKA. Dr. Sharol Given anticipates return to OR for BKA.  Dr. Sharol Given discussing this with patient. Patient wants to discuss with his brother on Monday.  Will continue abx and adjust as warranted for cx results.   AKI (acute kidney injury) ([name]) Baseline creatinine 1.1 Elevated to 1.67 on presentation Continue IVF Renal US without hydro ->thickening of bladder wall, will follow UA Related to NSAIDs?  Type 2 diabetes mellitus (Matthew) SSI Adjust as needed H1ac 12 12/2021 Repeat a1c Add basal/bolus regimen   Obesity (BMI 30-39.9) noted  Tobacco abuse Nicotine patch, gum Doesn't seem ready to quit  Hyperkalemia Repeat this afternoon follow     DVT prophylaxis: lovenox Code Status: full Family Communication: none Disposition:   Status is: Inpatient Remains inpatient appropriate because: need for IV abx   Consultants:  ortho  Procedures:  Left foot debribement  Antimicrobials:  Anti-infectives (From admission, onward)    Start     Dose/Rate Route Frequency Ordered Stop   05/06/22 1400  ceFEPIme (MAXIPIME) 2 g in sodium chloride 0.9 %  100 mL IVPB        2 g 200 mL/hr over 30 Minutes Intravenous Every 8 hours 05/06/22 0919     05/06/22 1000  metroNIDAZOLE (FLAGYL) tablet 500 mg        500 mg Oral Every 12 hours 05/06/22 0907     05/05/22 1800  vancomycin (VANCOREADY) IVPB 1250 mg/250 mL        1,250 mg 166.7 mL/hr over 90 Minutes Intravenous Every 24 hours 05/04/22 1906     05/05/22 0600  ceFAZolin (ANCEF) IVPB 2g/100 mL premix         2 g 200 mL/hr over 30 Minutes Intravenous On call to O.R. 05/04/22 2123 05/05/22 1152   05/04/22 2300  piperacillin-tazobactam (ZOSYN) IVPB 3.375 g  Status:  Discontinued        3.375 g 12.5 mL/hr over 240 Minutes Intravenous Every 8 hours 05/04/22 1729 05/06/22 0907   05/04/22 1500  ceFEPIme (MAXIPIME) 2 g in sodium chloride 0.9 % 100 mL IVPB  Status:  Discontinued        2 g 200 mL/hr over 30 Minutes Intravenous Every 8 hours 05/04/22 1450 05/04/22 1723   05/04/22 1500  vancomycin (VANCOREADY) IVPB 2000 mg/400 mL        2,000 mg 200 mL/hr over 120 Minutes Intravenous  Once 05/04/22 1450 05/04/22 2053       Subjective: No new complaints  Objective: Vitals:   05/05/22 2201 05/06/22 0608 05/06/22 0745 05/06/22 1202  BP: 126/69 123/79 109/70 110/64  Pulse: 79 77 79 71  Resp: 18 20 17 17   Temp: 98 F (36.7 C) 97.6 F (36.4 C) 98 F (36.7 C) (!) 97.4 F (36.3 C)  TempSrc: Oral Oral Oral Oral  SpO2: 97% 99% 99% 100%  Weight:      Height:       No intake or output data in the 24 hours ending 05/06/22 1511  Filed Weights   05/04/22 1332 05/04/22 2132 05/05/22 0950  Weight: 106.6 kg 100.9 kg 100.9 kg    Examination:  General: No acute distress. Cardiovascular: RRR Lungs: unlabored Abdomen: Soft, nontender, nondistended Neurological: Alert and oriented 3. Moves all extremities 4 . Cranial nerves II through XII grossly intact. Extremities: L foot bloody dressing    Data Reviewed: I have personally reviewed following labs and imaging studies  CBC: Recent Labs  Lab 05/04/22 1346 05/05/22 0622 05/06/22 0220  WBC 19.4* 19.0* 21.5*  NEUTROABS  --   --  19.7*  HGB 11.4* 10.5* 10.2*  HCT 35.9* 31.7* 32.3*  MCV 84.5 82.8 84.1  PLT 304 251 [id number]    Basic Metabolic Panel: Recent Labs  Lab 05/04/22 1346 05/05/22 0622 05/06/22 0220  NA 131* 137 131*  K 4.1 3.8 5.2*  CL 96* 105 99  CO2 26 25 24   GLUCOSE 455* 145* 441*  BUN 21* 20 30*  CREATININE 1.67*  1.43* 1.43*  CALCIUM 8.5* 8.4* 8.6*  MG  --   --  2.1  PHOS  --   --  2.8    GFR: Estimated Creatinine Clearance: 68.4 mL/min ([name]) (by C-G formula based on SCr of 1.43 mg/dL (H)).  Liver Function Tests: Recent Labs  Lab 05/06/22 0220  AST 11*  ALT 9  ALKPHOS 81  BILITOT 0.2*  PROT 6.4*  ALBUMIN 2.4*    CBG: Recent Labs  Lab 05/05/22 1639 05/05/22 1802 05/05/22 2204 05/06/22 0747 05/06/22 1159  GLUCAP 400* 403* 420* 482* 343*     Recent  Results (from the past 240 hour(s))  Blood culture (routine x 2)     Status: None (Preliminary result)   Collection Time: 05/04/22  2:44 PM   Specimen: BLOOD  Result Value Ref Range Status   Specimen Description BLOOD SITE NOT SPECIFIED  Final   Special Requests   Final    BOTTLES DRAWN AEROBIC AND ANAEROBIC Blood Culture adequate volume   Culture   Final    NO GROWTH 2 DAYS Performed at [facility], [address], [place], [place] [address]    Report Status PENDING  Incomplete  Blood culture (routine x 2)     Status: None (Preliminary result)   Collection Time: 05/04/22  2:55 PM   Specimen: BLOOD LEFT ARM  Result Value Ref Range Status   Specimen Description BLOOD LEFT ARM  Final   Special Requests   Final    BOTTLES DRAWN AEROBIC AND ANAEROBIC Blood Culture adequate volume   Culture   Final    NO GROWTH 2 DAYS Performed at [facility], [place] [address], [place], [place] [address]    Report Status PENDING  Incomplete  Aerobic/Anaerobic Culture w Gram Stain (surgical/deep wound)     Status: None (Preliminary result)   Collection Time: 05/04/22  3:25 PM   Specimen: Foot  Result Value Ref Range Status   Specimen Description FOOT  Final   Special Requests NONE  Final   Gram Stain   Final    RARE WBC PRESENT, PREDOMINANTLY PMN FEW GRAM POSITIVE COCCI IN PAIRS RARE GRAM NEGATIVE RODS    Culture   Final    MODERATE STREPTOCOCCUS GALLOLYTICUS RARE STAPHYLOCOCCUS AUREUS RARE KLEBSIELLA  OXYTOCA SUSCEPTIBILITIES TO FOLLOW HOLDING FOR POSSIBLE ANAEROBE Performed at [facility], [place] [address], [place], [place] [address]    Report Status PENDING  Incomplete  Surgical PCR screen     Status: Abnormal   Collection Time: 05/05/22 12:58 AM   Specimen: Nasal Mucosa; Nasal Swab  Result Value Ref Range Status   MRSA, PCR POSITIVE (Mirayah Wren) NEGATIVE Final    Comment: RESULT CALLED TO, READ BACK BY AND VERIFIED WITH: RN LANGSTON O. 05/05/22@2 :26 BY TW    Staphylococcus aureus POSITIVE (Bartosz Luginbill) NEGATIVE Final    Comment: (NOTE) The Xpert SA Assay (FDA approved for NASAL specimens in patients 44 years of age and older), is one component of Xzaviar Maloof comprehensive surveillance program. It is not intended to diagnose infection nor to guide or monitor treatment. Performed at [facility], [place] [address] [place], [place] [address]   Aerobic/Anaerobic Culture w Gram Stain (surgical/deep wound)     Status: None (Preliminary result)   Collection Time: 05/05/22 12:01 PM   Specimen: Soft Tissue, Other  Result Value Ref Range Status   Specimen Description TISSUE  Final   Special Requests TISSUE BOTTOM LEFT FOOT  Final   Gram Stain   Final    NO WBC SEEN ABUNDANT GRAM POSITIVE COCCI ABUNDANT GRAM NEGATIVE RODS RARE GRAM POSITIVE RODS    Culture   Final    RARE KLEBSIELLA OXYTOCA MODERATE STREPTOCOCCUS GALLOLYTICUS CULTURE REINCUBATED FOR BETTER GROWTH SUSCEPTIBILITIES TO FOLLOW Performed at [facility], [place] [address], [place],  [address]    Report Status PENDING  Incomplete  Aerobic/Anaerobic Culture w Gram Stain (surgical/deep wound)     Status: None (Preliminary result)   Collection Time: 05/05/22 12:02 PM   Specimen: Soft Tissue, Other  Result Value Ref Range Status  Specimen Description FOOT LEFT  Final   Special Requests SWAB BOTTOM LEFT FOOT SPEC B  Final   Gram Stain   Final    RARE WBC PRESENT,BOTH PMN AND MONONUCLEAR ABUNDANT GRAM POSITIVE COCCI ABUNDANT  GRAM NEGATIVE RODS    Culture   Final    CULTURE REINCUBATED FOR BETTER GROWTH Performed at [facility], [place] [address], [place], [place] [address]    Report Status PENDING  Incomplete         Radiology Studies: US RENAL  Result Date: 05/06/2022 CLINICAL DATA:  Diabetes mellitus EXAM: RENAL / URINARY TRACT ULTRASOUND COMPLETE COMPARISON:  None Available. FINDINGS: Right Kidney: Renal measurements: 11.4 x 5.8 x 6.4 cm = volume: 221.34 mL. Echogenicity within normal limits. No mass or hydronephrosis visualized. Left Kidney: Renal measurements: 11.6 x 5.6 x 6.1 cm = volume: 206.31 mL. Echogenicity within normal limits. No mass or hydronephrosis visualized. Bladder: Nonspecific slight thickening of the bladder wall, possibly due to under-distension. Other: None. IMPRESSION: Normal sonographic appearance of the kidneys.  No hydronephrosis. Nonspecific slight thickening of the bladder wall, possibly due to under-distension. Could correlate with urinalysis. Electronically Signed   By: Maurine Simmering M.D.   On: 05/06/2022 11:50   MR FOOT LEFT W WO CONTRAST  Result Date: 05/05/2022 CLINICAL DATA:  Diabetic foot swelling.  Ulcer. EXAM: MRI OF THE LEFT FOOT WITHOUT AND WITH CONTRAST TECHNIQUE: Multiplanar, multisequence MR imaging of the left foot was performed both before and after administration of intravenous contrast. Osteomyelitis protocol MRI of the foot was obtained, to include the entire foot and ankle. This protocol uses Telitha Plath large field of view to cover the entire foot and ankle, and is suitable for assessing bony structures for osteomyelitis. Due to the large field of view and imaging plane choice, this protocol is less sensitive for assessing small structures such as ligamentous structures of the foot and ankle, compared to Ozetta Flatley dedicated forefoot or dedicated hindfoot exam. CONTRAST:  60mL GADAVIST GADOBUTROL 1 MMOL/ML IV SOLN COMPARISON:  12/14/2021 and radiographs 05/04/2022 FINDINGS:  Osteomyelitis protocol MRI of the foot was obtained, to include the entire foot and ankle. This protocol uses Jourdon Zimmerle large field of view to cover the entire foot and ankle, and is suitable for assessing bony structures for osteomyelitis. Due to the large field of view and imaging plane choice, this protocol is less sensitive for assessing small structures such as ligamentous structures of the foot and ankle, compared to Lauralye Kinn dedicated forefoot or dedicated hindfoot exam. Bones/Joint/Cartilage Subtle accentuated edema and enhancement along the plantar side of the fifth metatarsal, nonspecific for arthropathy versus early osteomyelitis. Ligaments Lisfranc ligament intact. Muscles and Tendons Diffuse low-level edema in the regional musculature, possibly neurogenic. Abnormal gas tracks along the plantar margin of the flexor digitorum brevis muscle as shown on image 45 series 16, compatible with infection and myositis. Soft tissues Extensive subcutaneous gas along the plantar foot especially medially as on image 34 series 16. This tracks from about the level of the Chopart joint to the base of the first through fourth toes. Small amount of artifact associated with the ulceration along the plantar base of the fifth metatarsal with localized edema and infiltration of tissue in this vicinity. The most part the gas appears multilocular rather than confluent in Robbie Nangle single cavity, and no well-defined drainable abscess is observed. Dorsal subcutaneous edema and enhancement noted in the foot compatible with cellulitis. IMPRESSION: 1. Nonspecific low-grade edema and enhancement along the plantar side of the  head of the fifth metatarsal. This is nonspecific for arthropathy versus early osteomyelitis although the proximity to ulceration and localized subcutaneous infiltration tends to favor early osteomyelitis. 2. Extensive gas in the plantar soft tissues, primarily in the subcutaneous tissues extending from about the level of the Chopart  joint through the ball of the foot, but also with some extension to involve the plantar portion of the flexor digitorum brevis muscle, indicative of myositis and gas producing soft tissue infection. Electronically Signed   By: Van Clines M.D.   On: 05/05/2022 07:47        Scheduled Meds:  acidophilus  2 capsule Oral Daily   Chlorhexidine Gluconate Cloth  6 each Topical Q0600   docusate sodium  100 mg Oral BID   enoxaparin (LOVENOX) injection  50 mg Subcutaneous Q24H   insulin aspart  0-20 Units Subcutaneous TID WC   insulin aspart  0-5 Units Subcutaneous QHS   insulin aspart  5 Units Subcutaneous TID WC   insulin detemir  10 Units Subcutaneous BID   metroNIDAZOLE  500 mg Oral Q12H   mupirocin ointment  1 application. Nasal BID   nicotine  21 mg Transdermal Daily   Continuous Infusions:  sodium chloride 10 mL/hr at 05/05/22 1432   sodium chloride 125 mL/hr at 05/06/22 0814   ceFEPime (MAXIPIME) IV 2 g (05/06/22 1333)   methocarbamol (ROBAXIN) IV     vancomycin 1,250 mg (05/05/22 1837)     LOS: 2 days    Time spent: over 30 min    Fayrene Helper, MD [facility]   To contact the attending provider between 7A-7P or the covering provider during after hours 7P-7A, please log into the web site [email or web address] and access using universal [place] password for that web site. If you do not have the password, please call the hospital operator.  05/06/2022, 3:11 PM

## 2022-05-07 LAB — COMPREHENSIVE METABOLIC PANEL
ALT: 9 U/L (ref 0–44)
AST: 11 U/L — ABNORMAL LOW (ref 15–41)
Albumin: 2.7 g/dL — ABNORMAL LOW (ref 3.5–5.0)
Alkaline Phosphatase: 71 U/L (ref 38–126)
Anion gap: 7 (ref 5–15)
BUN: 28 mg/dL — ABNORMAL HIGH (ref 6–20)
CO2: 23 mmol/L (ref 22–32)
Calcium: 8.6 mg/dL — ABNORMAL LOW (ref 8.9–10.3)
Chloride: 105 mmol/L (ref 98–111)
Creatinine, Ser: 1.18 mg/dL (ref 0.61–1.24)
GFR, Estimated: >60 mL/min (ref >60)
Glucose, Bld: 99 mg/dL (ref 70–99)
Potassium: 3.6 mmol/L (ref 3.5–5.1)
Sodium: 135 mmol/L (ref 135–145)
Total Bilirubin: 0.2 mg/dL — ABNORMAL LOW (ref 0.3–1.2)
Total Protein: 6.7 g/dL (ref 6.5–8.1)

## 2022-05-07 LAB — GLUCOSE, CAPILLARY
Glucose-Capillary: 131 mg/dL — ABNORMAL HIGH (ref 70–99)
Glucose-Capillary: 149 mg/dL — ABNORMAL HIGH (ref 70–99)
Glucose-Capillary: 157 mg/dL — ABNORMAL HIGH (ref 70–99)
Glucose-Capillary: 167 mg/dL — ABNORMAL HIGH (ref 70–99)

## 2022-05-07 LAB — CBC WITH DIFFERENTIAL/PLATELET
Abs Immature Granulocytes: 0 10*3/uL (ref 0.00–0.07)
Basophils Absolute: 0 10*3/uL (ref 0.0–0.1)
Basophils Relative: 0 %
Eosinophils Absolute: 0 10*3/uL (ref 0.0–0.5)
Eosinophils Relative: 0 %
HCT: 32.6 % — ABNORMAL LOW (ref 39.0–52.0)
Hemoglobin: 10.4 g/dL — ABNORMAL LOW (ref 13.0–17.0)
Lymphocytes Relative: 13 %
Lymphs Abs: 2.9 10*3/uL (ref 0.7–4.0)
MCH: 26.6 pg (ref 26.0–34.0)
MCHC: 31.9 g/dL (ref 30.0–36.0)
MCV: 83.4 fL (ref 80.0–100.0)
Monocytes Absolute: 0.9 10*3/uL (ref 0.1–1.0)
Monocytes Relative: 4 %
Neutro Abs: 18.5 10*3/uL — ABNORMAL HIGH (ref 1.7–7.7)
Neutrophils Relative %: 83 %
Platelets: 305 10*3/uL (ref 150–400)
RBC: 3.91 MIL/uL — ABNORMAL LOW (ref 4.22–5.81)
RDW: 13.7 % (ref 11.5–15.5)
WBC: 22.3 10*3/uL — ABNORMAL HIGH (ref 4.0–10.5)
nRBC: 0 % (ref 0.0–0.2)
nRBC: 1 /100 WBC — ABNORMAL HIGH

## 2022-05-07 LAB — HEMOGLOBIN A1C
Hgb A1c MFr Bld: 11.5 % — ABNORMAL HIGH (ref 4.8–5.6)
Mean Plasma Glucose: 283.35 mg/dL

## 2022-05-07 LAB — PHOSPHORUS: Phosphorus: 2.3 mg/dL — ABNORMAL LOW (ref 2.5–4.6)

## 2022-05-07 LAB — MAGNESIUM: Magnesium: 2.3 mg/dL (ref 1.7–2.4)

## 2022-05-07 MED ORDER — VANCOMYCIN HCL 1750 MG/350ML IV SOLN
1750.0000 mg | INTRAVENOUS | Status: DC
  Administered 2022-05-07 – 2022-05-09 (×3): 1750 mg via INTRAVENOUS
  Filled 2022-05-07 (×3): qty 350

## 2022-05-07 NOTE — Progress Notes (Signed)
Pharmacy Antibiotic Note- follow-up  [name] is a 56 y.o. male for which pharmacy has been consulted for cefepime and vancomycin dosing for  DFI .  Patient with a history of  left foot diabetic ulcer, IDDM. Patient presenting with left foot pain and pus coming out.  SCr 1.67 > 1.43 - continues to stay above baseline  Plan: Continue cefepime 2 grams iv q8h Change Vancomycin dose to 1750 mg q24hr (eAUC 474.3) 2/2 improved renal function (serum creatinine utilized 1.18 mg/dL)  Plan a vanco peak and trough assessment with the 3rd dose (05/09/2022) Trend WBC, Fever, Renal function, & Clinical course F/u cultures, clinical course, WBC, fever De-escalate when able  Height: 5\' 9"  (175.3 cm) Weight: 100.9 kg (222 lb 7.1 oz) IBW/kg (Calculated) : 70.7  Temp (24hrs), Avg:98 F (36.7 C), Min:97.4 F (36.3 C), Max:99 F (37.2 C)  Recent Labs  Lab 05/04/22 1346 05/04/22 1506 05/04/22 1549 05/05/22 0622 05/06/22 0220 05/06/22 1558 05/07/22 0123  WBC 19.4*  --   --  19.0* 21.5*  --  22.3*  CREATININE 1.67*  --   --  1.43* 1.43* 1.34* 1.18  LATICACIDVEN  --  1.6 1.2  --   --   --   --      Estimated Creatinine Clearance: 82.8 mL/min (by C-G formula based on SCr of 1.18 mg/dL).    No Known Allergies  Antimicrobials this admission: Cefepime x 1 in ED vancomycin 6/1 >>  zosyn 6/1 >> 6/3 Cefepime 6/3 >> Flagyl 6/3 >>  Microbiology results: 6/1 BCx - ngtd [D4] 6/2 surgical wound cx - abundant GPC, GNR; rare GPC; rare WBC [both [PMN / Mononuclear]- unchanged report from 6/3 6/2 surgical PCR - positive MRSA  Thank you for allowing pharmacy to be a part of this patient's care.  8/2 BS, PharmD, BCPS Clinical Pharmacist 05/07/2022 8:14 AM  Contact: [phone] after 3 PM  "Be curious, not judgmental..." -[phone]

## 2022-05-07 NOTE — Progress Notes (Signed)
L foot Dressing saturated, after assessing dressing and beginning a dressing change, dressing was not coming off easily.it appeared to be dried blood..pt says he did not get dressing wet. Unsure of events that led to saturated dressing. Due to the amount of debridement of the foot. new dressing applied to prior and reinforced until new orders for dressing changes are given. Will request specific orders in the am with ortho team. Pt has no pain at his time.

## 2022-05-07 NOTE — Progress Notes (Signed)
PROGRESS NOTE    [name]  [id number] DOB: 11-13-1966 DOA: 05/04/2022 PCP: Patient, No Pcp Per (Inactive)  Chief Complaint  Patient presents with   Foot Pain    Brief Narrative:  [name] is [name] 56 y.o. male with medical history significant of left foot diabetic ulcer, IDDM, came with left foot pain and abscess.   Patient had [name] chronic left-sided diabetic foot ulcer, initially infected in January, MRI at that point showed only soft tissue infection and patient received treatment of p.o. antibiotics.  Symptoms has been stable, no pain or enlargement of the wound until about 1 week ago, patient ran out of his insulins and started to have left-sided foot pain.  The pain was 2-3/10, but last 2 days has become unbearable.  Last night he had to take Decie Verne total of 200 mg x 8 of ibuprofen and 4 Tylenol to control the pain.  Denies any fever.  For last 2 days, he also noticed thick pus coming out of the wound associated with full smell.  No fever or chills.   ED Course: Patient was found tachycardia blood pressure borderline but afebrile.  Elevated WBC count, creatinine 1.6 compared to baseline 1.1-1.3.   X-ray compatible with left lateral forefoot soft tissue ulceration suspicious for foreign body with extensive subcu cutaneous emphysema.   Vancomycin and cefepime started in the ED.    Assessment & Plan:   Principal Problem:   Diabetic foot ulcer ([place]) Active Problems:   AKI (acute kidney injury) ([place])   Type 2 diabetes mellitus ([place])   Obesity (BMI 30-39.9)   Tobacco abuse   Hyperkalemia   Cellulitis and abscess of foot   Assessment and Plan: * Diabetic foot ulcer ([place]) MRI L foot with nonspecific low grade edema and enhancement along plantar side of head of 5th metatarsal - arthropathy vs early osteo Extensive gas in plantar soft tissues - extension to involve the plantar portion of flexor digitorum brevis muscle indicative of myositis and gas producing soft tissue infection Blood  cultures pending Aerobic/anaerobic cx MRSA, strep gallolyticus, klebsiella oxytoca Follow operative cultures -> klebsiella oxytoca, strep gallolyticus, strep gordonii, staph aureus CRP 26.4, sed rate 107 Persistent leukocytosis Now s/p left foot debridement by Dr. Sharol Given, pt had extensive necrotic skin and soft tissue and muscle.  No soft tissue coverage of the meta tarsal heads.  Per Dr. Sharol Given, patient does not have foot salvage intervention options and will require [name] BKA. Dr. Sharol Given anticipates return to OR for BKA.  Dr. Sharol Given discussing this with patient. Patient wants to discuss with his brother on Monday.  Likely BKA wed per [name] PAC note today.  Will continue abx and adjust as warranted for cx results.   AKI (acute kidney injury) ([place]) Baseline creatinine 1.1 Elevated to 1.67 on presentation improved Continue IVF Renal US without hydro ->thickening of bladder wall, will follow UA ([place]) Related to NSAIDs?  Type 2 diabetes mellitus ([place]) SSI Adjust as needed H1ac 12 12/2021 Repeat a1c 11.5 basal/bolus regimen   Obesity (BMI 30-39.9) noted  Tobacco abuse Nicotine patch, gum Doesn't seem ready to quit  Hyperkalemia resolved     DVT prophylaxis: lovenox Code Status: full Family Communication: none Disposition:   Status is: Inpatient Remains inpatient appropriate because: need for IV abx   Consultants:  ortho  Procedures:  Left foot debribement  Antimicrobials:  Anti-infectives (From admission, onward)    Start     Dose/Rate Route Frequency Ordered Stop   05/07/22 1430  vancomycin (VANCOREADY) IVPB 1750 mg/350 mL        1,750 mg 175 mL/hr over 120 Minutes Intravenous Every 24 hours 05/07/22 0813     05/06/22 1400  ceFEPIme (MAXIPIME) 2 g in sodium chloride 0.9 % 100 mL IVPB        2 g 200 mL/hr over 30 Minutes Intravenous Every 8 hours 05/06/22 0919     05/06/22 1000  metroNIDAZOLE (FLAGYL) tablet 500 mg        500 mg Oral Every 12 hours 05/06/22 0907      05/05/22 1800  vancomycin (VANCOREADY) IVPB 1250 mg/250 mL  Status:  Discontinued        1,250 mg 166.7 mL/hr over 90 Minutes Intravenous Every 24 hours 05/04/22 1906 05/07/22 0813   05/05/22 0600  ceFAZolin (ANCEF) IVPB 2g/100 mL premix        2 g 200 mL/hr over 30 Minutes Intravenous On call to O.R. 05/04/22 2123 05/05/22 1152   05/04/22 2300  piperacillin-tazobactam (ZOSYN) IVPB 3.375 g  Status:  Discontinued        3.375 g 12.5 mL/hr over 240 Minutes Intravenous Every 8 hours 05/04/22 1729 05/06/22 0907   05/04/22 1500  ceFEPIme (MAXIPIME) 2 g in sodium chloride 0.9 % 100 mL IVPB  Status:  Discontinued        2 g 200 mL/hr over 30 Minutes Intravenous Every 8 hours 05/04/22 1450 05/04/22 1723   05/04/22 1500  vancomycin (VANCOREADY) IVPB 2000 mg/400 mL        2,000 mg 200 mL/hr over 120 Minutes Intravenous  Once 05/04/22 1450 05/04/22 2053       Subjective: No new complaints Asking for help cleaning floor due to drainage from wound  Objective: Vitals:   05/06/22 1945 05/07/22 0500 05/07/22 0756 05/07/22 1316  BP: (!) 144/95 132/76 126/86 131/66  Pulse: 80 78 75 79  Resp: 18 19 16 16   Temp: 99 F (37.2 C) 97.8 F (36.6 C) 97.9 F (36.6 C) 98 F (36.7 C)  TempSrc: Oral Oral Oral Oral  SpO2: 99% 99% 100% 98%  Weight:      Height:        Intake/Output Summary (Last 24 hours) at 05/07/2022 1434 Last data filed at 05/07/2022 0849 Gross per 24 hour  Intake 600 ml  Output --  Net 600 ml    Filed Weights   05/04/22 1332 05/04/22 2132 05/05/22 0950  Weight: 106.6 kg 100.9 kg 100.9 kg    Examination:  General: No acute distress. Cardiovascular: RRR Lungs: unlabored Neurological: Alert and oriented 3. Moves all extremities 4 with equal strength. Cranial nerves II through XII grossly intact. Extremities: dressing to LLE    Data Reviewed: I have personally reviewed following labs and imaging studies  CBC: Recent Labs  Lab 05/04/22 1346 05/05/22 0622  05/06/22 0220 05/07/22 0123  WBC 19.4* 19.0* 21.5* 22.3*  NEUTROABS  --   --  19.7* 18.5*  HGB 11.4* 10.5* 10.2* 10.4*  HCT 35.9* 31.7* 32.3* 32.6*  MCV 84.5 82.8 84.1 83.4  PLT 304 251 281 [id number]    Basic Metabolic Panel: Recent Labs  Lab 05/04/22 1346 05/05/22 0622 05/06/22 0220 05/06/22 1558 05/07/22 0123  NA 131* 137 131* 130* 135  K 4.1 3.8 5.2* 4.4 3.6  CL 96* 105 99 99 105  CO2 26 25 24 22 23   GLUCOSE 455* 145* 441* 413* 99  BUN 21* 20 30* 32* 28*  CREATININE 1.67* 1.43* 1.43* 1.34* 1.18  CALCIUM  8.5* 8.4* 8.6* 8.2* 8.6*  MG  --   --  2.1  --  2.3  PHOS  --   --  2.8  --  2.3*    GFR: Estimated Creatinine Clearance: 82.8 mL/min (by C-G formula based on SCr of 1.18 mg/dL).  Liver Function Tests: Recent Labs  Lab 05/06/22 0220 05/07/22 0123  AST 11* 11*  ALT 9 9  ALKPHOS 81 71  BILITOT 0.2* 0.2*  PROT 6.4* 6.7  ALBUMIN 2.4* 2.7*    CBG: Recent Labs  Lab 05/06/22 1159 05/06/22 1622 05/06/22 2205 05/07/22 0724 05/07/22 1316  GLUCAP 343* 393* 106* 131* 149*     Recent Results (from the past 240 hour(s))  Blood culture (routine x 2)     Status: None (Preliminary result)   Collection Time: 05/04/22  2:44 PM   Specimen: BLOOD  Result Value Ref Range Status   Specimen Description BLOOD SITE NOT SPECIFIED  Final   Special Requests   Final    BOTTLES DRAWN AEROBIC AND ANAEROBIC Blood Culture adequate volume   Culture   Final    NO GROWTH 3 DAYS Performed at [facility], [place] [address] [place], [place] [address]    Report Status PENDING  Incomplete  Blood culture (routine x 2)     Status: None (Preliminary result)   Collection Time: 05/04/22  2:55 PM   Specimen: BLOOD LEFT ARM  Result Value Ref Range Status   Specimen Description BLOOD LEFT ARM  Final   Special Requests   Final    BOTTLES DRAWN AEROBIC AND ANAEROBIC Blood Culture adequate volume   Culture   Final    NO GROWTH 3 DAYS Performed at [facility], [place] [address]  [place], [place] [address]    Report Status PENDING  Incomplete  Aerobic/Anaerobic Culture w Gram Stain (surgical/deep wound)     Status: None (Preliminary result)   Collection Time: 05/04/22  3:25 PM   Specimen: Foot  Result Value Ref Range Status   Specimen Description FOOT  Final   Special Requests NONE  Final   Gram Stain   Final    RARE WBC PRESENT, PREDOMINANTLY PMN FEW GRAM POSITIVE COCCI IN PAIRS RARE GRAM NEGATIVE RODS    Culture   Final    MODERATE STREPTOCOCCUS GALLOLYTICUS RARE METHICILLIN RESISTANT STAPHYLOCOCCUS AUREUS RARE KLEBSIELLA OXYTOCA SUSCEPTIBILITIES TO FOLLOW HOLDING FOR POSSIBLE ANAEROBE Performed at [facility], [place] [address] [place], [place] [address]    Report Status PENDING  Incomplete   Organism ID, Bacteria STREPTOCOCCUS GALLOLYTICUS  Final   Organism ID, Bacteria METHICILLIN RESISTANT STAPHYLOCOCCUS AUREUS  Final      Susceptibility   Methicillin resistant staphylococcus aureus - MIC*    CIPROFLOXACIN >=8 RESISTANT Resistant     ERYTHROMYCIN >=8 RESISTANT Resistant     GENTAMICIN <=0.5 SENSITIVE Sensitive     OXACILLIN >=4 RESISTANT Resistant     TETRACYCLINE <=1 SENSITIVE Sensitive     VANCOMYCIN 1 SENSITIVE Sensitive     TRIMETH/SULFA >=320 RESISTANT Resistant     CLINDAMYCIN <=0.25 SENSITIVE Sensitive     RIFAMPIN <=0.5 SENSITIVE Sensitive     Inducible Clindamycin NEGATIVE Sensitive     * RARE METHICILLIN RESISTANT STAPHYLOCOCCUS AUREUS   Streptococcus gallolyticus - MIC*    PENICILLIN 0.12 SENSITIVE Sensitive     * MODERATE STREPTOCOCCUS GALLOLYTICUS  Surgical PCR screen     Status: Abnormal   Collection Time: 05/05/22 12:58 AM   Specimen: Nasal  Mucosa; Nasal Swab  Result Value Ref Range Status   MRSA, PCR POSITIVE (Mardi Cannady) NEGATIVE Final    Comment: RESULT CALLED TO, READ BACK BY AND VERIFIED WITH: RN LANGSTON O. 05/05/22@2 :26 BY TW    Staphylococcus aureus POSITIVE (Arisbel Maione) NEGATIVE Final    Comment: (NOTE) The Xpert SA Assay (FDA  approved for NASAL specimens in patients 93 years of age and older), is one component of [facility] comprehensive surveillance program. It is not intended to diagnose infection nor to guide or monitor treatment. Performed at [facility], [place] [address] [place], [place] [address]   Aerobic/Anaerobic Culture w Gram Stain (surgical/deep wound)     Status: None (Preliminary result)   Collection Time: 05/05/22 12:01 PM   Specimen: Soft Tissue, Other  Result Value Ref Range Status   Specimen Description TISSUE  Final   Special Requests TISSUE BOTTOM LEFT FOOT  Final   Gram Stain   Final    NO WBC SEEN ABUNDANT GRAM POSITIVE COCCI ABUNDANT GRAM NEGATIVE RODS RARE GRAM POSITIVE RODS    Culture   Final    RARE KLEBSIELLA OXYTOCA MODERATE STREPTOCOCCUS GALLOLYTICUS FEW STAPHYLOCOCCUS AUREUS SUSCEPTIBILITIES TO FOLLOW HOLDING FOR POSSIBLE ANAEROBE Performed at [facility], [place] [address] [place], [place] [address]    Report Status PENDING  Incomplete   Organism ID, Bacteria KLEBSIELLA OXYTOCA  Final   Organism ID, Bacteria STREPTOCOCCUS GALLOLYTICUS  Final      Susceptibility   Klebsiella oxytoca - MIC*    AMPICILLIN >=32 RESISTANT Resistant     CEFAZOLIN <=4 SENSITIVE Sensitive     CEFEPIME <=0.12 SENSITIVE Sensitive     CEFTAZIDIME <=1 SENSITIVE Sensitive     CEFTRIAXONE <=0.25 SENSITIVE Sensitive     CIPROFLOXACIN <=0.25 SENSITIVE Sensitive     GENTAMICIN <=1 SENSITIVE Sensitive     IMIPENEM <=0.25 SENSITIVE Sensitive     TRIMETH/SULFA 40 SENSITIVE Sensitive     AMPICILLIN/SULBACTAM 16 INTERMEDIATE Intermediate     PIP/TAZO <=4 SENSITIVE Sensitive     * RARE KLEBSIELLA OXYTOCA   Streptococcus gallolyticus - MIC*    PENICILLIN 0.12 SENSITIVE Sensitive     CEFTRIAXONE 0.25 SENSITIVE Sensitive     ERYTHROMYCIN <=0.12 SENSITIVE Sensitive     LEVOFLOXACIN 2 SENSITIVE Sensitive     VANCOMYCIN 0.25 SENSITIVE Sensitive     * MODERATE STREPTOCOCCUS GALLOLYTICUS   Aerobic/Anaerobic Culture w Gram Stain (surgical/deep wound)     Status: None (Preliminary result)   Collection Time: 05/05/22 12:02 PM   Specimen: Soft Tissue, Other  Result Value Ref Range Status   Specimen Description FOOT LEFT  Final   Special Requests SWAB BOTTOM LEFT FOOT SPEC B  Final   Gram Stain   Final    RARE WBC PRESENT,BOTH PMN AND MONONUCLEAR ABUNDANT GRAM POSITIVE COCCI ABUNDANT GRAM NEGATIVE RODS    Culture   Final    FEW STREPTOCOCCUS GALLOLYTICUS FEW STAPHYLOCOCCUS AUREUS FEW STREPTOCOCCUS GORDONII SUSCEPTIBILITIES TO FOLLOW HOLDING FOR POSSIBLE ANAEROBE Performed at [facility], [place] [address], [place], [place] [address]    Report Status PENDING  Incomplete         Radiology Studies: US RENAL  Result Date: 05/06/2022 CLINICAL DATA:  Diabetes mellitus EXAM: RENAL / URINARY TRACT ULTRASOUND COMPLETE COMPARISON:  None Available. FINDINGS: Right Kidney: Renal measurements: 11.4 x 5.8 x 6.4 cm = volume: 221.34 mL. Echogenicity within normal limits. No mass or hydronephrosis visualized. Left Kidney: Renal measurements: 11.6 x 5.6 x 6.1 cm = volume:  206.31 mL. Echogenicity within normal limits. No mass or hydronephrosis visualized. Bladder: Nonspecific slight thickening of the bladder wall, possibly due to under-distension. Other: None. IMPRESSION: Normal sonographic appearance of the kidneys.  No hydronephrosis. Nonspecific slight thickening of the bladder wall, possibly due to under-distension. Could correlate with urinalysis. Electronically Signed   By: Maurine Simmering M.D.   On: 05/06/2022 11:50        Scheduled Meds:  acidophilus  2 capsule Oral Daily   Chlorhexidine Gluconate Cloth  6 each Topical Q0600   docusate sodium  100 mg Oral BID   enoxaparin (LOVENOX) injection  50 mg Subcutaneous Q24H   insulin aspart  0-20 Units Subcutaneous TID WC   insulin aspart  0-5 Units Subcutaneous QHS   insulin aspart  5 Units Subcutaneous TID WC   insulin detemir   10 Units Subcutaneous BID   metroNIDAZOLE  500 mg Oral Q12H   mupirocin ointment  1 application. Nasal BID   nicotine  21 mg Transdermal Daily   Continuous Infusions:  sodium chloride 10 mL/hr at 05/05/22 1432   sodium chloride 125 mL/hr at 05/06/22 1919   ceFEPime (MAXIPIME) IV 2 g (05/07/22 0521)   methocarbamol (ROBAXIN) IV     vancomycin       LOS: 3 days    Time spent: over 30 min    Fayrene Helper, MD [facility]   To contact the attending provider between 7A-7P or the covering provider during after hours 7P-7A, please log into the web site [email or web address] and access using universal [place] password for that web site. If you do not have the password, please call the hospital operator.  05/07/2022, 2:34 PM

## 2022-05-07 NOTE — Progress Notes (Signed)
Subjective: 2 Days Post-Op Procedure(s) (LRB): LEFT FOOT DEBRIDEMENT (Left) Patient reports pain as mild.  Feeling ok.   Objective: Vital signs in last 24 hours: Temp:  [97.4 F (36.3 C)-99 F (37.2 C)] 97.9 F (36.6 C) (06/04 0756) Pulse Rate:  [71-81] 75 (06/04 0756) Resp:  [16-19] 16 (06/04 0756) BP: (110-144)/(64-95) 126/86 (06/04 0756) SpO2:  [99 %-100 %] 100 % (06/04 0756)  Intake/Output from previous day: 06/03 0701 - 06/04 0700 In: 120 [P.O.:120] Out: -  Intake/Output this shift: No intake/output data recorded.  Recent Labs    05/04/22 1346 05/05/22 0622 05/06/22 0220 05/07/22 0123  HGB 11.4* 10.5* 10.2* 10.4*   Recent Labs    05/06/22 0220 05/07/22 0123  WBC 21.5* 22.3*  RBC 3.84* 3.91*  HCT 32.3* 32.6*  PLT 281 305   Recent Labs    05/06/22 1558 05/07/22 0123  NA 130* 135  K 4.4 3.6  CL 99 105  CO2 22 23  BUN 32* 28*  CREATININE 1.34* 1.18  GLUCOSE 413* 99  CALCIUM 8.2* 8.6*   No results for input(s): LABPT, INR in the last 72 hours.  Neurologically intact Incision: scant drainage Compartment soft Bandage in place with scan drainage to heel    Assessment/Plan: 2 Days Post-Op Procedure(s) (LRB): LEFT FOOT DEBRIDEMENT (Left) PLAN NWB LLE Plan for probable BKA by Dr. Lajoyce Corners Wednesday      [name] 05/07/2022, 10:28 AM

## 2022-05-08 LAB — CBC WITH DIFFERENTIAL/PLATELET
Abs Immature Granulocytes: 0.15 10*3/uL — ABNORMAL HIGH (ref 0.00–0.07)
Basophils Absolute: 0 10*3/uL (ref 0.0–0.1)
Basophils Relative: 0 %
Eosinophils Absolute: 0.1 10*3/uL (ref 0.0–0.5)
Eosinophils Relative: 1 %
HCT: 31.4 % — ABNORMAL LOW (ref 39.0–52.0)
Hemoglobin: 10.1 g/dL — ABNORMAL LOW (ref 13.0–17.0)
Immature Granulocytes: 1 %
Lymphocytes Relative: 26 %
Lymphs Abs: 2.7 10*3/uL (ref 0.7–4.0)
MCH: 26.6 pg (ref 26.0–34.0)
MCHC: 32.2 g/dL (ref 30.0–36.0)
MCV: 82.8 fL (ref 80.0–100.0)
Monocytes Absolute: 0.9 10*3/uL (ref 0.1–1.0)
Monocytes Relative: 9 %
Neutro Abs: 6.5 10*3/uL (ref 1.7–7.7)
Neutrophils Relative %: 63 %
Platelets: 313 10*3/uL (ref 150–400)
RBC: 3.79 MIL/uL — ABNORMAL LOW (ref 4.22–5.81)
RDW: 14.1 % (ref 11.5–15.5)
WBC: 10.4 10*3/uL (ref 4.0–10.5)
nRBC: 0 % (ref 0.0–0.2)

## 2022-05-08 LAB — COMPREHENSIVE METABOLIC PANEL
ALT: 7 U/L (ref 0–44)
AST: 9 U/L — ABNORMAL LOW (ref 15–41)
Albumin: 2.1 g/dL — ABNORMAL LOW (ref 3.5–5.0)
Alkaline Phosphatase: 61 U/L (ref 38–126)
Anion gap: 5 (ref 5–15)
BUN: 18 mg/dL (ref 6–20)
CO2: 25 mmol/L (ref 22–32)
Calcium: 8.2 mg/dL — ABNORMAL LOW (ref 8.9–10.3)
Chloride: 106 mmol/L (ref 98–111)
Creatinine, Ser: 1.24 mg/dL (ref 0.61–1.24)
GFR, Estimated: >60 mL/min (ref >60)
Glucose, Bld: 421 mg/dL — ABNORMAL HIGH (ref 70–99)
Potassium: 4.8 mmol/L (ref 3.5–5.1)
Sodium: 136 mmol/L (ref 135–145)
Total Bilirubin: 0.1 mg/dL — ABNORMAL LOW (ref 0.3–1.2)
Total Protein: 5.8 g/dL — ABNORMAL LOW (ref 6.5–8.1)

## 2022-05-08 LAB — GLUCOSE, CAPILLARY
Glucose-Capillary: 316 mg/dL — ABNORMAL HIGH (ref 70–99)
Glucose-Capillary: 103 mg/dL — ABNORMAL HIGH (ref 70–99)
Glucose-Capillary: 281 mg/dL — ABNORMAL HIGH (ref 70–99)
Glucose-Capillary: 194 mg/dL — ABNORMAL HIGH (ref 70–99)

## 2022-05-08 LAB — AEROBIC/ANAEROBIC CULTURE W GRAM STAIN (SURGICAL/DEEP WOUND): Gram Stain: NONE SEEN

## 2022-05-08 LAB — PHOSPHORUS: Phosphorus: 3 mg/dL (ref 2.5–4.6)

## 2022-05-08 LAB — MAGNESIUM: Magnesium: 1.9 mg/dL (ref 1.7–2.4)

## 2022-05-08 MED ORDER — MUPIROCIN 2 % EX OINT
1.0000 "application " | TOPICAL_OINTMENT | Freq: Two times a day (BID) | CUTANEOUS | Status: AC
  Administered 2022-05-08 – 2022-05-09 (×4): 1 via NASAL
  Filled 2022-05-08 (×2): qty 22

## 2022-05-08 MED ORDER — CHLORHEXIDINE GLUCONATE CLOTH 2 % EX PADS
6.0000 | MEDICATED_PAD | Freq: Every day | CUTANEOUS | Status: AC
  Administered 2022-05-08 – 2022-05-09 (×2): 6 via TOPICAL

## 2022-05-08 MED ORDER — CEFTRIAXONE SODIUM 2 G IJ SOLR
2.0000 g | INTRAMUSCULAR | Status: DC
  Administered 2022-05-08 – 2022-05-09 (×2): 2 g via INTRAVENOUS
  Filled 2022-05-08 (×2): qty 20

## 2022-05-08 NOTE — Progress Notes (Signed)
PROGRESS NOTE    [name]  [id number] DOB: [birth year] DOA: 05/04/2022 PCP: Patient, No Pcp Per (Inactive)  Chief Complaint  Patient presents with   Foot Pain    Brief Narrative:  [name] is [name] 56 y.o. male with medical history significant of left foot diabetic ulcer, IDDM, came with left foot pain and abscess.   Patient had [name] chronic left-sided diabetic foot ulcer, initially infected in January, MRI at that point showed only soft tissue infection and patient received treatment of p.o. antibiotics.  Symptoms has been stable, no pain or enlargement of the wound until about 1 week ago, patient ran out of his insulins and started to have left-sided foot pain.  The pain was 2-3/10, but last 2 days has become unbearable.  Last night he had to take [name] total of 200 mg x 8 of ibuprofen and 4 Tylenol to control the pain.  Denies any fever.  For last 2 days, he also noticed thick pus coming out of the wound associated with full smell.  No fever or chills.   ED Course: Patient was found tachycardia blood pressure borderline but afebrile.  Elevated WBC count, creatinine 1.6 compared to baseline 1.1-1.3.   X-ray compatible with left lateral forefoot soft tissue ulceration suspicious for foreign body with extensive subcu cutaneous emphysema.   Vancomycin and cefepime started in the ED.    Assessment & Plan:   Principal Problem:   Diabetic foot ulcer ([place]) Active Problems:   AKI (acute kidney injury) ([place])   Type 2 diabetes mellitus ([place])   Obesity (BMI 30-39.9)   Tobacco abuse   Hyperkalemia   Cellulitis and abscess of foot   Assessment and Plan: * Diabetic foot ulcer ([place]) MRI L foot with nonspecific low grade edema and enhancement along plantar side of head of 5th metatarsal - arthropathy vs early osteo Extensive gas in plantar soft tissues - extension to involve the plantar portion of flexor digitorum brevis muscle indicative of myositis and gas producing soft tissue infection Blood  cultures pending Aerobic/anaerobic cx MRSA, strep gallolyticus, klebsiella oxytoca Follow operative cultures -> klebsiella oxytoca, strep gallolyticus, strep gordonii, staph aureus CRP 26.4, sed rate 107 Leukocytosis improved today Now s/p left foot debridement by Dr. Sharol Given, pt had extensive necrotic skin and soft tissue and muscle.  No soft tissue coverage of the meta tarsal heads.  Per Dr. Sharol Given, patient does not have foot salvage intervention options and will require [name] BKA. Dr. Sharol Given anticipates return to OR for BKA.  Dr. Sharol Given discussing this with patient, apparently he wants to pursue treatment at the wound center against medical advice.  Will follow up.  If not planning for surgery will need to c/s ID.   AKI (acute kidney injury) ([place]) Baseline creatinine 1.1 Elevated to 1.67 on presentation improved Continue IVF Renal US without hydro ->thickening of bladder wall, will follow UA ([place]) Related to NSAIDs?  Type 2 diabetes mellitus ([place]) SSI Adjust as needed H1ac 12 12/2021 Repeat a1c 11.5 basal/bolus regimen - will adjust prn  Obesity (BMI 30-39.9) noted  Tobacco abuse Nicotine patch, gum Doesn't seem ready to quit  Hyperkalemia resolved     DVT prophylaxis: lovenox Code Status: full Family Communication: noen Disposition:   Status is: Inpatient Remains inpatient appropriate because: IV abx, ortho   Consultants:  ortho  Procedures:   LEFT FOOT DEBRIDEMENT Antimicrobials:  Anti-infectives (From admission, onward)    Start     Dose/Rate Route Frequency Ordered Stop   05/08/22 1415  cefTRIAXone (ROCEPHIN) 2 g in sodium chloride 0.9 % 100 mL IVPB        2 g 200 mL/hr over 30 Minutes Intravenous Every 24 hours 05/08/22 1317     05/07/22 1430  vancomycin (VANCOREADY) IVPB 1750 mg/350 mL        1,750 mg 175 mL/hr over 120 Minutes Intravenous Every 24 hours 05/07/22 0813     05/06/22 1400  ceFEPIme (MAXIPIME) 2 g in sodium chloride 0.9 % 100 mL IVPB  Status:   Discontinued        2 g 200 mL/hr over 30 Minutes Intravenous Every 8 hours 05/06/22 0919 05/08/22 1317   05/06/22 1000  metroNIDAZOLE (FLAGYL) tablet 500 mg        500 mg Oral Every 12 hours 05/06/22 0907     05/05/22 1800  vancomycin (VANCOREADY) IVPB 1250 mg/250 mL  Status:  Discontinued        1,250 mg 166.7 mL/hr over 90 Minutes Intravenous Every 24 hours 05/04/22 1906 05/07/22 0813   05/05/22 0600  ceFAZolin (ANCEF) IVPB 2g/100 mL premix        2 g 200 mL/hr over 30 Minutes Intravenous On call to O.R. 05/04/22 2123 05/05/22 1152   05/04/22 2300  piperacillin-tazobactam (ZOSYN) IVPB 3.375 g  Status:  Discontinued        3.375 g 12.5 mL/hr over 240 Minutes Intravenous Every 8 hours 05/04/22 1729 05/06/22 0907   05/04/22 1500  ceFEPIme (MAXIPIME) 2 g in sodium chloride 0.9 % 100 mL IVPB  Status:  Discontinued        2 g 200 mL/hr over 30 Minutes Intravenous Every 8 hours 05/04/22 1450 05/04/22 1723   05/04/22 1500  vancomycin (VANCOREADY) IVPB 2000 mg/400 mL        2,000 mg 200 mL/hr over 120 Minutes Intravenous  Once 05/04/22 1450 05/04/22 2053       Subjective: No complaints He's under covers and denies complaints  Objective: Vitals:   05/07/22 1613 05/07/22 1956 05/08/22 0422 05/08/22 0756  BP: 135/78 137/77 (!) 121/91 139/86  Pulse: 79 79 91 85  Resp: 18 18 18 18   Temp: 98.1 F (36.7 C) 97.8 F (36.6 C) 99 F (37.2 C) 98.4 F (36.9 C)  TempSrc: Oral Oral Oral Oral  SpO2: 99% 99% 99% 100%  Weight:      Height:        Intake/Output Summary (Last 24 hours) at 05/08/2022 1321 Last data filed at 05/08/2022 0700 Gross per 24 hour  Intake 3654.45 ml  Output --  Net 3654.45 ml   Filed Weights   05/04/22 1332 05/04/22 2132 05/05/22 0950  Weight: 106.6 kg 100.9 kg 100.9 kg    Examination:  General exam: sleeping, under covers - will circle around Respiratory system: unlabored Central nervous system: Alert and oriented. No focal neurological  deficits. Extremities: lle in dressing    Data Reviewed: I have personally reviewed following labs and imaging studies  CBC: Recent Labs  Lab 05/04/22 1346 05/05/22 0622 05/06/22 0220 05/07/22 0123 05/08/22 0350  WBC 19.4* 19.0* 21.5* 22.3* 10.4  NEUTROABS  --   --  19.7* 18.5* 6.5  HGB 11.4* 10.5* 10.2* 10.4* 10.1*  HCT 35.9* 31.7* 32.3* 32.6* 31.4*  MCV 84.5 82.8 84.1 83.4 82.8  PLT 304 251 281 305 [id number]    Basic Metabolic Panel: Recent Labs  Lab 05/05/22 0622 05/06/22 0220 05/06/22 1558 05/07/22 0123 05/08/22 0350  NA 137 131* 130* 135 136  K 3.8 5.2*  4.4 3.6 4.8  CL 105 99 99 105 106  CO2 25 24 22 23 25   GLUCOSE 145* 441* 413* 99 421*  BUN 20 30* 32* 28* 18  CREATININE 1.43* 1.43* 1.34* 1.18 1.24  CALCIUM 8.4* 8.6* 8.2* 8.6* 8.2*  MG  --  2.1  --  2.3 1.9  PHOS  --  2.8  --  2.3* 3.0    GFR: Estimated Creatinine Clearance: 78.8 mL/min (by C-G formula based on SCr of 1.24 mg/dL).  Liver Function Tests: Recent Labs  Lab 05/06/22 0220 05/07/22 0123 05/08/22 0350  AST 11* 11* 9*  ALT 9 9 7   ALKPHOS 81 71 61  BILITOT 0.2* 0.2* 0.1*  PROT 6.4* 6.7 5.8*  ALBUMIN 2.4* 2.7* 2.1*    CBG: Recent Labs  Lab 05/07/22 1316 05/07/22 1614 05/07/22 2144 05/08/22 0752 05/08/22 1133  GLUCAP 149* 157* 167* 316* 103*     Recent Results (from the past 240 hour(s))  Blood culture (routine x 2)     Status: None (Preliminary result)   Collection Time: 05/04/22  2:44 PM   Specimen: BLOOD  Result Value Ref Range Status   Specimen Description BLOOD SITE NOT SPECIFIED  Final   Special Requests   Final    BOTTLES DRAWN AEROBIC AND ANAEROBIC Blood Culture adequate volume   Culture   Final    NO GROWTH 4 DAYS Performed at [facility], [place] [address] [place], [place] [address]    Report Status PENDING  Incomplete  Blood culture (routine x 2)     Status: None (Preliminary result)   Collection Time: 05/04/22  2:55 PM   Specimen: BLOOD LEFT ARM  Result  Value Ref Range Status   Specimen Description BLOOD LEFT ARM  Final   Special Requests   Final    BOTTLES DRAWN AEROBIC AND ANAEROBIC Blood Culture adequate volume   Culture   Final    NO GROWTH 4 DAYS Performed at [facility], [place] [address] [place], [place] [address]    Report Status PENDING  Incomplete  Aerobic/Anaerobic Culture w Gram Stain (surgical/deep wound)     Status: None   Collection Time: 05/04/22  3:25 PM   Specimen: Foot  Result Value Ref Range Status   Specimen Description FOOT  Final   Special Requests NONE  Final   Gram Stain   Final    RARE WBC PRESENT, PREDOMINANTLY PMN FEW GRAM POSITIVE COCCI IN PAIRS RARE GRAM NEGATIVE RODS    Culture   Final    MODERATE STREPTOCOCCUS GALLOLYTICUS RARE METHICILLIN RESISTANT STAPHYLOCOCCUS AUREUS RARE KLEBSIELLA OXYTOCA FEW PREVOTELLA SPECIES BETA LACTAMASE POSITIVE Performed at [facility], [place] [address] [place],  [address]    Report Status 05/08/2022 FINAL  Final   Organism ID, Bacteria STREPTOCOCCUS GALLOLYTICUS  Final   Organism ID, Bacteria METHICILLIN RESISTANT STAPHYLOCOCCUS AUREUS  Final   Organism ID, Bacteria KLEBSIELLA OXYTOCA  Final      Susceptibility   Klebsiella oxytoca - MIC*    AMPICILLIN >=32 RESISTANT Resistant     CEFAZOLIN <=4 SENSITIVE Sensitive     CEFEPIME <=0.12 SENSITIVE Sensitive     CEFTAZIDIME <=1 SENSITIVE Sensitive     CEFTRIAXONE <=0.25 SENSITIVE Sensitive     CIPROFLOXACIN <=0.25 SENSITIVE Sensitive     GENTAMICIN <=1 SENSITIVE Sensitive     IMIPENEM <=0.25 SENSITIVE Sensitive     TRIMETH/SULFA >=320 RESISTANT Resistant     AMPICILLIN/SULBACTAM 16 INTERMEDIATE Intermediate  PIP/TAZO <=4 SENSITIVE Sensitive     * RARE KLEBSIELLA OXYTOCA   Methicillin resistant staphylococcus aureus - MIC*    CIPROFLOXACIN >=8 RESISTANT Resistant     ERYTHROMYCIN >=8 RESISTANT Resistant     GENTAMICIN <=0.5 SENSITIVE Sensitive     OXACILLIN >=4 RESISTANT Resistant      TETRACYCLINE <=1 SENSITIVE Sensitive     VANCOMYCIN 1 SENSITIVE Sensitive     TRIMETH/SULFA >=320 RESISTANT Resistant     CLINDAMYCIN <=0.25 SENSITIVE Sensitive     RIFAMPIN <=0.5 SENSITIVE Sensitive     Inducible Clindamycin NEGATIVE Sensitive     * RARE METHICILLIN RESISTANT STAPHYLOCOCCUS AUREUS   Streptococcus gallolyticus - MIC*    PENICILLIN 0.12 SENSITIVE Sensitive     * MODERATE STREPTOCOCCUS GALLOLYTICUS  Surgical PCR screen     Status: Abnormal   Collection Time: 05/05/22 12:58 AM   Specimen: Nasal Mucosa; Nasal Swab  Result Value Ref Range Status   MRSA, PCR POSITIVE ([name]) NEGATIVE Final    Comment: RESULT CALLED TO, READ BACK BY AND VERIFIED WITH: RN LANGSTON O. 05/05/22@2 :26 BY TW    Staphylococcus aureus POSITIVE ([name]) NEGATIVE Final    Comment: (NOTE) The Xpert SA Assay (FDA approved for NASAL specimens in patients 16 years of age and older), is one component of [name] comprehensive surveillance program. It is not intended to diagnose infection nor to guide or monitor treatment. Performed at [facility], [place] [address], [place], [place] [address]   Aerobic/Anaerobic Culture w Gram Stain (surgical/deep wound)     Status: None (Preliminary result)   Collection Time: 05/05/22 12:01 PM   Specimen: Soft Tissue, Other  Result Value Ref Range Status   Specimen Description TISSUE  Final   Special Requests TISSUE BOTTOM LEFT FOOT  Final   Gram Stain   Final    NO WBC SEEN ABUNDANT GRAM POSITIVE COCCI ABUNDANT GRAM NEGATIVE RODS RARE GRAM POSITIVE RODS    Culture   Final    RARE KLEBSIELLA OXYTOCA MODERATE STREPTOCOCCUS GALLOLYTICUS FEW STAPHYLOCOCCUS AUREUS HOLDING FOR POSSIBLE ANAEROBE Performed at [facility], [place] [address] [place], [place] [address]    Report Status PENDING  Incomplete   Organism ID, Bacteria KLEBSIELLA OXYTOCA  Final   Organism ID, Bacteria STREPTOCOCCUS GALLOLYTICUS  Final   Organism ID, Bacteria STAPHYLOCOCCUS AUREUS  Final       Susceptibility   Klebsiella oxytoca - MIC*    AMPICILLIN >=32 RESISTANT Resistant     CEFAZOLIN <=4 SENSITIVE Sensitive     CEFEPIME <=0.12 SENSITIVE Sensitive     CEFTAZIDIME <=1 SENSITIVE Sensitive     CEFTRIAXONE <=0.25 SENSITIVE Sensitive     CIPROFLOXACIN <=0.25 SENSITIVE Sensitive     GENTAMICIN <=1 SENSITIVE Sensitive     IMIPENEM <=0.25 SENSITIVE Sensitive     TRIMETH/SULFA 40 SENSITIVE Sensitive     AMPICILLIN/SULBACTAM 16 INTERMEDIATE Intermediate     PIP/TAZO <=4 SENSITIVE Sensitive     * RARE KLEBSIELLA OXYTOCA   Staphylococcus aureus - MIC*    CIPROFLOXACIN <=0.5 SENSITIVE Sensitive     ERYTHROMYCIN <=0.25 SENSITIVE Sensitive     GENTAMICIN <=0.5 SENSITIVE Sensitive     OXACILLIN <=0.25 SENSITIVE Sensitive     TETRACYCLINE <=1 SENSITIVE Sensitive     VANCOMYCIN 1 SENSITIVE Sensitive     TRIMETH/SULFA <=10 SENSITIVE Sensitive     CLINDAMYCIN <=0.25 SENSITIVE Sensitive     RIFAMPIN <=0.5 SENSITIVE Sensitive     Inducible Clindamycin NEGATIVE Sensitive     *  FEW STAPHYLOCOCCUS AUREUS   Streptococcus gallolyticus - MIC*    PENICILLIN 0.12 SENSITIVE Sensitive     CEFTRIAXONE 0.25 SENSITIVE Sensitive     ERYTHROMYCIN <=0.12 SENSITIVE Sensitive     LEVOFLOXACIN 2 SENSITIVE Sensitive     VANCOMYCIN 0.25 SENSITIVE Sensitive     * MODERATE STREPTOCOCCUS GALLOLYTICUS  Aerobic/Anaerobic Culture w Gram Stain (surgical/deep wound)     Status: None (Preliminary result)   Collection Time: 05/05/22 12:02 PM   Specimen: Soft Tissue, Other  Result Value Ref Range Status   Specimen Description FOOT LEFT  Final   Special Requests SWAB BOTTOM LEFT FOOT SPEC B  Final   Gram Stain   Final    RARE WBC PRESENT,BOTH PMN AND MONONUCLEAR ABUNDANT GRAM POSITIVE COCCI ABUNDANT GRAM NEGATIVE RODS    Culture   Final    FEW STREPTOCOCCUS GALLOLYTICUS FEW STAPHYLOCOCCUS AUREUS FEW STREPTOCOCCUS GORDONII SUSCEPTIBILITIES TO FOLLOW HOLDING FOR POSSIBLE ANAEROBE    Report Status PENDING   Incomplete   Organism ID, Bacteria STAPHYLOCOCCUS AUREUS  Final   Organism ID, Bacteria STREPTOCOCCUS GORDONII  Final      Susceptibility   Streptococcus gordonii - MIC*    PENICILLIN <=0.06 SENSITIVE Sensitive     CEFTRIAXONE <=0.12 SENSITIVE Sensitive     ERYTHROMYCIN 2 RESISTANT Resistant     LEVOFLOXACIN 0.5 SENSITIVE Sensitive     VANCOMYCIN Value in next row Sensitive      0.5 SENSITIVEPerformed at [facility] [address], [place], [place] [address]    * FEW STREPTOCOCCUS GORDONII   Staphylococcus aureus - MIC*    CIPROFLOXACIN Value in next row Sensitive      0.5 SENSITIVEPerformed at [place] [address] [place], [place] [address]    ERYTHROMYCIN Value in next row Sensitive      0.5 SENSITIVEPerformed at [facility] [address], [place], [place] [address]    GENTAMICIN Value in next row Sensitive      0.5 SENSITIVEPerformed at [facility] [address], [place], [place] [address]    OXACILLIN Value in next row Sensitive      0.5 SENSITIVEPerformed at [place] [address], [place], [place] [address]    TETRACYCLINE Value in next row Sensitive      0.5 SENSITIVEPerformed at [facility] [address] [place], [place] [address]    VANCOMYCIN Value in next row Sensitive      0.5 SENSITIVEPerformed at [facility] [address], [place], [place] [address]    TRIMETH/SULFA Value in next row Sensitive      0.5 SENSITIVEPerformed at [facility] [address], [place], [place] [address]    CLINDAMYCIN Value in next row Sensitive      0.5 SENSITIVEPerformed at [facility] [address] [place], [place] [address]    RIFAMPIN Value in next row Sensitive      0.5 SENSITIVEPerformed at [place] [address] [place], [place] [address]    Inducible Clindamycin Value in next row Sensitive      0.5 SENSITIVEPerformed at [facility] [address], [place], [place] [address]     * FEW STAPHYLOCOCCUS AUREUS         Radiology Studies: No results found.      Scheduled Meds:  acidophilus  2 capsule Oral Daily   Chlorhexidine Gluconate Cloth  6 each Topical Daily  docusate sodium  100 mg Oral BID   enoxaparin (LOVENOX) injection  50 mg Subcutaneous Q24H   insulin aspart  0-20 Units Subcutaneous TID WC   insulin aspart  0-5 Units Subcutaneous QHS   insulin aspart  5 Units Subcutaneous TID WC   insulin detemir  10 Units Subcutaneous BID   metroNIDAZOLE  500 mg Oral Q12H   mupirocin ointment  1 application. Nasal BID   nicotine  21 mg Transdermal Daily   Continuous Infusions:  sodium chloride Stopped (05/06/22 1238)   cefTRIAXone (ROCEPHIN)  IV     methocarbamol (ROBAXIN) IV     vancomycin 1,750 mg (05/08/22 1030)     LOS: 4 days    Time spent: over 30 min    Fayrene Helper, MD [facility]   To contact the attending provider between 7A-7P or the covering provider during after hours 7P-7A, please log into the web site [email or web address] and access using universal [place] password for that web site. If you do not have the password, please call the hospital operator.  05/08/2022, 1:21 PM

## 2022-05-08 NOTE — Plan of Care (Signed)
  Problem: Coping: Goal: Ability to adjust to condition or change in health will improve Outcome: Progressing   Problem: Health Behavior/Discharge Planning: Goal: Ability to manage health-related needs will improve Outcome: Progressing   Problem: Metabolic: Goal: Ability to maintain appropriate glucose levels will improve Outcome: Progressing   

## 2022-05-08 NOTE — TOC Progression Note (Signed)
Transition of Care [facility] - Progression Note    Patient Details  Name: [name] MRN: [id number] Date of Birth: 01/29/1966  [facility]) CM/SW Contact  Nadene Rubins Adria Devon, RN Phone Number: 05/08/2022, 3:02 PM  Clinical Narrative:     [facility] Team continuing to follow. Placed financial counselor consult   Expected Discharge Plan: Home/Self Care Barriers to Discharge: Continued Medical Work up  Expected Discharge Plan and Services Expected Discharge Plan: Home/Self Care   Discharge Planning Services: CM Consult   Living arrangements for the past 2 months: Single Family Home                 DME Arranged: N/A         HH Arranged: NA           Social Determinants of Health (SDOH) Interventions    Readmission Risk Interventions     View : No data to display.

## 2022-05-08 NOTE — Progress Notes (Signed)
Inpatient Diabetes Program Recommendations  AACE/ADA: New Consensus Statement on Inpatient Glycemic Control (2015)  Target Ranges:  Prepandial:   less than 140 mg/dL      Peak postprandial:   less than 180 mg/dL (1-2 hours)      Critically ill patients:  140 - 180 mg/dL   Lab Results  Component Value Date   GLUCAP 103 (H) 05/08/2022   HGBA1C 11.5 (H) 05/07/2022    Review of Glycemic Control  Latest Reference Range & Units 05/07/22 07:24 05/07/22 13:16 05/07/22 16:14 05/07/22 21:44 05/08/22 07:52 05/08/22 11:33  Glucose-Capillary 70 - 99 mg/dL 295 (H) 188 (H) 416 (H) 167 (H) 316 (H) 103 (H)   Diabetes history: DM 2 Outpatient Diabetes medications:  Humulin 70/30 10 units bid Metformin 500 mg bid Current orders for Inpatient glycemic control:  Novolog resistant tid with meals and HS Novolog 5 units tid with meals Levemir 10 units bid  Inpatient Diabetes Program Recommendations:    Spoke with patient regarding DM management.  He states that he is taking his insulin as prescribed.  However he does not have PCP?  He states that he received Rx. For insulin from hospital (however last admission was 12/14/21). We briefly discussed A1C results and the importance of glycemic control.  Patient states he does check blood sugars at home and that they are up and down.  Reminded him of importance of f/u with PCP and continued control of blood sugars to help with healing. A1c is slightly better then previous admission.   Thanks,  Beryl Meager, RN, BC-ADM Inpatient Diabetes Coordinator Pager [phone]  (8a-5p)

## 2022-05-08 NOTE — Progress Notes (Signed)
Patient ID: [name], male   DOB: [birth year], 56 y.o.   MRN: [id number] Patient is seen in follow-up status post debridement left foot.  Discussed with the patient that essentially the entire plantar aspect of his left foot was necrotic, we resected the necrotic tissue but the wound margins were not clear.  Discussed that he only has exposed metatarsals on the plantar aspect of the foot with no muscle or soft tissue coverage.  Patient wanted to discuss treatment options with his brother present.  We reviewed his surgical findings and discussed recommendations to proceed with a below-knee amputation.  Patient's brother is also a below-knee amputee.  Patient states that he wants to consider treatment at the wound center.  I offered obtaining a second opinion and he did not want to pursue a second opinion.  Discussed that he is at risk of progressive infection possible sepsis possible not be able to have a transtibial amputation.  Patient states he understands and wants to pursue evaluation at the wound center.  I discussed that this is against my medical advice, he states he understands.  I recommended that if he develops any signs of sepsis or infection or systemic symptoms to go to the [facility] emergency room immediately.

## 2022-05-09 DIAGNOSIS — M868X7 Other osteomyelitis, ankle and foot: Secondary | ICD-10-CM

## 2022-05-09 DIAGNOSIS — Z794 Long term (current) use of insulin: Secondary | ICD-10-CM

## 2022-05-09 DIAGNOSIS — L97423 Non-pressure chronic ulcer of left heel and midfoot with necrosis of muscle: Secondary | ICD-10-CM

## 2022-05-09 LAB — COMPREHENSIVE METABOLIC PANEL
ALT: 13 U/L (ref 0–44)
AST: 17 U/L (ref 15–41)
Albumin: 2.2 g/dL — ABNORMAL LOW (ref 3.5–5.0)
Alkaline Phosphatase: 59 U/L (ref 38–126)
Anion gap: 4 — ABNORMAL LOW (ref 5–15)
BUN: 12 mg/dL (ref 6–20)
CO2: 25 mmol/L (ref 22–32)
Calcium: 8.2 mg/dL — ABNORMAL LOW (ref 8.9–10.3)
Chloride: 105 mmol/L (ref 98–111)
Creatinine, Ser: 1.23 mg/dL (ref 0.61–1.24)
GFR, Estimated: >60 mL/min (ref >60)
Glucose, Bld: 394 mg/dL — ABNORMAL HIGH (ref 70–99)
Potassium: 4.8 mmol/L (ref 3.5–5.1)
Sodium: 134 mmol/L — ABNORMAL LOW (ref 135–145)
Total Bilirubin: 0.5 mg/dL (ref 0.3–1.2)
Total Protein: 5.8 g/dL — ABNORMAL LOW (ref 6.5–8.1)

## 2022-05-09 LAB — GLUCOSE, CAPILLARY
Glucose-Capillary: 309 mg/dL — ABNORMAL HIGH (ref 70–99)
Glucose-Capillary: 132 mg/dL — ABNORMAL HIGH (ref 70–99)
Glucose-Capillary: 220 mg/dL — ABNORMAL HIGH (ref 70–99)
Glucose-Capillary: 223 mg/dL — ABNORMAL HIGH (ref 70–99)

## 2022-05-09 LAB — CULTURE, BLOOD (ROUTINE X 2)
Culture: NO GROWTH
Culture: NO GROWTH
Special Requests: ADEQUATE
Special Requests: ADEQUATE

## 2022-05-09 LAB — CBC WITH DIFFERENTIAL/PLATELET
Abs Immature Granulocytes: 0.26 10*3/uL — ABNORMAL HIGH (ref 0.00–0.07)
Basophils Absolute: 0 10*3/uL (ref 0.0–0.1)
Basophils Relative: 0 %
Eosinophils Absolute: 0.1 10*3/uL (ref 0.0–0.5)
Eosinophils Relative: 1 %
HCT: 30.2 % — ABNORMAL LOW (ref 39.0–52.0)
Hemoglobin: 9.8 g/dL — ABNORMAL LOW (ref 13.0–17.0)
Immature Granulocytes: 2 %
Lymphocytes Relative: 21 %
Lymphs Abs: 2.5 10*3/uL (ref 0.7–4.0)
MCH: 26.6 pg (ref 26.0–34.0)
MCHC: 32.5 g/dL (ref 30.0–36.0)
MCV: 82.1 fL (ref 80.0–100.0)
Monocytes Absolute: 1 10*3/uL (ref 0.1–1.0)
Monocytes Relative: 8 %
Neutro Abs: 7.9 10*3/uL — ABNORMAL HIGH (ref 1.7–7.7)
Neutrophils Relative %: 68 %
Platelets: 340 10*3/uL (ref 150–400)
RBC: 3.68 MIL/uL — ABNORMAL LOW (ref 4.22–5.81)
RDW: 14.2 % (ref 11.5–15.5)
WBC: 11.8 10*3/uL — ABNORMAL HIGH (ref 4.0–10.5)
nRBC: 0 % (ref 0.0–0.2)

## 2022-05-09 LAB — MAGNESIUM: Magnesium: 1.9 mg/dL (ref 1.7–2.4)

## 2022-05-09 LAB — VANCOMYCIN, PEAK: Vancomycin Pk: 30 ug/mL (ref 30–40)

## 2022-05-09 LAB — PHOSPHORUS: Phosphorus: 2.7 mg/dL (ref 2.5–4.6)

## 2022-05-09 MED ORDER — MELATONIN 3 MG PO TABS
3.0000 mg | ORAL_TABLET | Freq: Once | ORAL | Status: AC
  Administered 2022-05-10: 3 mg via ORAL
  Filled 2022-05-09: qty 1

## 2022-05-09 MED ORDER — INSULIN DETEMIR 100 UNIT/ML ~~LOC~~ SOLN
15.0000 [IU] | Freq: Two times a day (BID) | SUBCUTANEOUS | Status: DC
  Administered 2022-05-09 – 2022-05-10 (×2): 15 [IU] via SUBCUTANEOUS
  Filled 2022-05-09 (×3): qty 0.15

## 2022-05-09 MED ORDER — CEFAZOLIN SODIUM-DEXTROSE 2-4 GM/100ML-% IV SOLN
2.0000 g | Freq: Three times a day (TID) | INTRAVENOUS | Status: DC
  Administered 2022-05-10 – 2022-05-11 (×5): 2 g via INTRAVENOUS
  Filled 2022-05-09 (×5): qty 100

## 2022-05-09 NOTE — Progress Notes (Signed)
Inpatient Diabetes Program Recommendations  AACE/ADA: New Consensus Statement on Inpatient Glycemic Control (2015)  Target Ranges:  Prepandial:   less than 140 mg/dL      Peak postprandial:   less than 180 mg/dL (1-2 hours)      Critically ill patients:  140 - 180 mg/dL   Lab Results  Component Value Date   GLUCAP 309 (H) 05/09/2022   HGBA1C 11.5 (H) 05/07/2022    Review of Glycemic Control  Latest Reference Range & Units 05/07/22 21:44 05/08/22 07:52 05/08/22 11:33 05/08/22 16:20 05/08/22 20:58 05/09/22 07:43  Glucose-Capillary 70 - 99 mg/dL 825 (H) 003 (H) 704 (H) 281 (H) 194 (H) 309 (H)   Diabetes history: DM 2 Outpatient Diabetes medications:  Humulin 70/30 10 units bid Metformin 500 mg bid Current orders for Inpatient glycemic control:  Novolog resistant tid with meals and HS Novolog 5 units tid with meals Levemir 10 units bid  Inpatient Diabetes Program Recommendations:    Please increase Levemir to 15 units bid.  Note that home dose of 70/30 likely needs to be adjusted as well.  He needs close f/u with PCP for adjustment.   Thanks,  Beryl Meager, RN, BC-ADM Inpatient Diabetes Coordinator Pager [phone]  (8a-5p)

## 2022-05-09 NOTE — TOC Progression Note (Signed)
Transition of Care [facility]) - Progression Note    Patient Details  Name: [name] MRN: [id number] Date of Birth: 04-27-1966  [facility]) CM/SW Contact  Nadene Rubins Adria Devon, RN Phone Number: 05/09/2022, 4:43 PM  Clinical Narrative:    See previous note . Appointment scheduled at [facility]  July 3 , 2023 at 0930 am     Patient lives alone confirmed face sheet information. Brother and his nephew live across the street and can assist.   Plan to place a PICC line and have IV ABX for 6 weeks.   NCM discussed with patient at bedside.   Infusion company would be [facility], they have a nurse who will come to hospital prior to discharge to teach him and a support person how to administer IV ABX at home.   [facility] will need to see if patient qualifies for financial  assistance and discuss cost.   [name] is home health agency assistance patients with no insurance this week . Referral sent to Amy with [facility] . Amy will review referral and see if they can accept and also if patient qualifies for charity home health.   Patient aware of all of above.    Expected Discharge Plan: Home/Self Care Barriers to Discharge: Continued Medical Work up  Expected Discharge Plan and Services Expected Discharge Plan: Home/Self Care   Discharge Planning Services: CM Consult   Living arrangements for the past 2 months: Single Family Home                 DME Arranged: N/A         HH Arranged: NA           Social Determinants of Health (SDOH) Interventions    Readmission Risk Interventions     View : No data to display.

## 2022-05-09 NOTE — Consult Note (Signed)
[facility]    Date of Admission:  05/04/2022   Total days of inpatient antibiotics 3        Reason for Consult: foot wound    Principal Problem:   Diabetic foot ulcer ([place]) Active Problems:   AKI (acute kidney injury) ([place])   Cellulitis and abscess of foot   Type 2 diabetes mellitus (HCC)   Obesity (BMI 30-39.9)   Tobacco abuse   Hyperkalemia   Assessment: 56 year old with diabetic foot ulcer admitted with diabetic foot wound.  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  -Taken to OR on 6/2(Ortho, Dr. Jeanett Schlein) for left foot debridement and found to have extensive necrotic soft tissue and muscle, tissue sent for Cx. Noted that he did not have foot salvage options and will require BKA. -Pt declined BKA and ID engaged.  Recommendations:  -D/C vancomycin(only foot swab grew MRSA) and ceftriaxone -Start cefazolin and metronidazole -Place PICC -6 weeks of IV therapy and ID follow-up. I had an extensive discussion that antibiotics will not be curative and that I am in agreement with Orthopedics in regards to BKA for infection control. He reports he would like to "try everything he can" before amputation.  OPAT ORDERS:  Diagnosis: 5th metatarsal osteomyelitis  Culture Result: Klebsella oxytoca, MSSA< strep,   No Known Allergies   Discharge antibiotics to be given via PICC line:  Per pharmacy protocol cefazolin 2gm q8h and  metronidazole PO    Duration: 6 weeks End Date: 06/15/22  [facility] Care Per Protocol with [employer] Use: Home health RN for IV administration and teaching, line care and labs.    Labs weekly while on IV antibiotics: __ CBC  with differential __ CMP __ CRP __ ESR   __ Please pull PIC at completion of IV antibiotics   Fax weekly labs to [phone]  Clinic Follow Up Appt: 05/23/22  @ [facility] with Dr. Candiss Norse  Microbiology:   Antibiotics: Cefazolin 6/2 Cefepime 6/1, 6/3-4 Metronidazole 6/3- Pip-tazo 6/1-6/2 Vancomycin 6/1- CTX 6/5-  Cultures: Blood 6/1 no growth Other 6/2 1/2 OR cultures growing Klebsiella oxytoca, strep gallolyticus, Prevotella species beta-lactamase positive, MSSA 1/2 MSSA, strep gordonae, strep gallolyticus  HPI: [name] is a 56 y.o. male with left foot diabetic  ulcer, diabetes mellitus, presented with left foot pain and abscess.  He has a history of chronic left-sided diabetic foot ulcer initially infected in January.  MRI at that point showed soft tissue infection patient received p.o. antibiotics.  Pain started worsening pain with enlarging foot  wound for 1 week prior to admission.  On arrival to the ED WBC 19 K, temp of 101 x-ray showed left lateral forefoot soft tissue ulceration with suspicion for foreign body and extensive subcutaneous emphysema.  He was started on vancomycin and cefepime.  MRI of left foot showed fifth metatarsal osteomyelitis versus arthropathy and extensive soft tissue gas /myositis  in the plantar region.  Taken to the OR with Dr. Sharol Given on 6/2 for debridement.  Extensive necrosis of tissue noted in the OR, tissue sent for cultures.  Ortho,  noted there is no foot salvage intervention options and patient will require BKA.  Patient plans to pursue treatment at the wound center  against medical advice. ID engaged.    Review of Systems: Review of Systems  All other systems reviewed and are negative.  Past Medical History:  Diagnosis Date   Diabetes mellitus without complication (HCC)     Social History   Tobacco Use   Smoking status: Every Day    Packs/day: 1.00    Types: Cigarettes   Smokeless tobacco: Never  Vaping Use   Vaping Use: Never  used  Substance Use Topics   Alcohol use: No   Drug use: No    History reviewed. No pertinent family history. Scheduled Meds:  acidophilus  2 capsule Oral Daily   docusate sodium  100 mg Oral BID   enoxaparin (LOVENOX) injection  50 mg Subcutaneous Q24H   insulin aspart  0-20 Units Subcutaneous TID WC   insulin aspart  0-5 Units Subcutaneous QHS   insulin aspart  5 Units Subcutaneous TID WC   insulin detemir  15 Units Subcutaneous BID   metroNIDAZOLE  500 mg Oral Q12H   mupirocin ointment  1 application. Nasal BID   nicotine  21 mg Transdermal Daily   Continuous Infusions:  sodium chloride Stopped (05/06/22 1238)   cefTRIAXone (ROCEPHIN)  IV 2 g (05/09/22 1437)   methocarbamol (ROBAXIN) IV     vancomycin 1,750 mg (05/09/22 0955)   PRN Meds:.acetaminophen, bisacodyl, HYDROmorphone (DILAUDID) injection, methocarbamol **OR** methocarbamol (ROBAXIN) IV, metoCLOPramide **OR** metoCLOPramide (REGLAN) injection, nicotine polacrilex, ondansetron (ZOFRAN) IV, ondansetron **OR** ondansetron (ZOFRAN) IV, oxyCODONE, polyethylene glycol No Known Allergies  OBJECTIVE: Blood pressure 128/90, pulse 87, temperature 99.5 F (37.5 C), temperature source Oral, resp. rate 16, height _0  (1.753 m), weight 100.9 kg, SpO2 99 %.  Physical Exam Constitutional:      General: He is not in acute distress.    Appearance: He is normal weight. He is not toxic-appearing.  HENT:     Head: Normocephalic and atraumatic.     Right Ear: External ear normal.     Left Ear: External ear normal.     Nose: No congestion or rhinorrhea.     Mouth/Throat:     Mouth: Mucous membranes are moist.     Pharynx: Oropharynx is clear.  Eyes:     Extraocular Movements: Extraocular movements intact.     Conjunctiva/sclera: Conjunctivae normal.     Pupils: Pupils are equal, round, and reactive to light.  Cardiovascular:     Rate and Rhythm: Normal rate and regular rhythm.     Heart sounds: No murmur heard.   No friction  rub. No gallop.  Pulmonary:     Effort: Pulmonary effort is normal.     Breath sounds: Normal breath sounds.  Abdominal:     General: Abdomen is flat. Bowel sounds are normal.     Palpations: Abdomen is soft.  Musculoskeletal:        General: No swelling.     Cervical back: Normal range of motion and neck supple.  Skin:    General: Skin is warm and dry.  Neurological:     General: No focal deficit present.     Mental Status: He is oriented to person, place, and time.  Psychiatric:        Mood and Affect: Mood normal.    Lab Results Lab Results  Component Value Date   WBC 11.8 (H) 05/09/2022   HGB 9.8 (L) 05/09/2022   HCT 30.2 (L) 05/09/2022   MCV 82.1 05/09/2022   PLT 340 05/09/2022    Lab Results  Component  Value Date   CREATININE 1.23 05/09/2022   BUN 12 05/09/2022   NA 134 (L) 05/09/2022   K 4.8 05/09/2022   CL 105 05/09/2022   CO2 25 05/09/2022    Lab Results  Component Value Date   ALT 13 05/09/2022   AST 17 05/09/2022   ALKPHOS 59 05/09/2022   BILITOT 0.5 05/09/2022       Laurice Record, [facility] 05/09/2022, 3:28 PM

## 2022-05-09 NOTE — Progress Notes (Signed)
PROGRESS NOTE    [name]  [id number] DOB: 16-Apr-1966 DOA: 05/04/2022 PCP: Patient, No Pcp Per (Inactive)  Chief Complaint  Patient presents with   Foot Pain    Brief Narrative:  [name] is [name] 56 y.o. male with medical history significant of left foot diabetic ulcer, IDDM, came with left foot pain and abscess.  S/p debridement by Dr. Sharol Given on 6/2.  Recommending amputation, patient not interested at this time.  Id c/s for abx recommendations.  Will need IV abx for home and wound center referral.     Assessment & Plan:   Principal Problem:   Diabetic foot ulcer ([place]) Active Problems:   AKI (acute kidney injury) ([place])   Type 2 diabetes mellitus ([place])   Obesity (BMI 30-39.9)   Tobacco abuse   Hyperkalemia   Cellulitis and abscess of foot   Assessment and Plan: * Diabetic foot ulcer ([place]) MRI L foot with nonspecific low grade edema and enhancement along plantar side of head of 5th metatarsal - arthropathy vs early osteo Extensive gas in plantar soft tissues - extension to involve the plantar portion of flexor digitorum brevis muscle indicative of myositis and gas producing soft tissue infection Blood cultures pending Aerobic/anaerobic cx MRSA, strep gallolyticus, klebsiella oxytoca Follow operative cultures -> klebsiella oxytoca, strep gallolyticus, strep gordonii, staph aureus CRP 26.4, sed rate 107 Leukocytosis improved today Now s/p left foot debridement by Dr. Sharol Given, pt had extensive necrotic skin and soft tissue and muscle.  No soft tissue coverage of the meta tarsal heads.  Patient refused amputation recommended by Dr. Sharol Given. ID c/s for abx recommendations Discussed wound care with Dr. Sharol Given (Patient can wash his foot with soap and water apply Cleofas Hudgins dry dressing, minimize weightbearing.  Ideally nonweightbearing.)  AKI (acute kidney injury) ([place]) Baseline creatinine 1.1 Elevated to 1.67 on presentation improved Continue IVF Renal US without hydro ->thickening of  bladder wall, will follow UA ([place]) Related to NSAIDs?  Type 2 diabetes mellitus ([place]) SSI Adjust as needed H1ac 12 12/2021 Repeat a1c 11.5 basal/bolus regimen - will adjust prn  Obesity (BMI 30-39.9) noted  Tobacco abuse Nicotine patch, gum Doesn't seem ready to quit  Hyperkalemia resolved     DVT prophylaxis: lovenox Code Status: full Family Communication: sister, brother, bro in law Disposition:   Status is: Inpatient Remains inpatient appropriate because: IV abx, ortho   Consultants:  ortho  Procedures:   LEFT FOOT DEBRIDEMENT Antimicrobials:  Anti-infectives (From admission, onward)    Start     Dose/Rate Route Frequency Ordered Stop   05/10/22 0600  ceFAZolin (ANCEF) IVPB 2g/100 mL premix        2 g 200 mL/hr over 30 Minutes Intravenous Every 8 hours 05/09/22 1545     05/08/22 1415  cefTRIAXone (ROCEPHIN) 2 g in sodium chloride 0.9 % 100 mL IVPB  Status:  Discontinued        2 g 200 mL/hr over 30 Minutes Intravenous Every 24 hours 05/08/22 1317 05/09/22 1545   05/07/22 1430  vancomycin (VANCOREADY) IVPB 1750 mg/350 mL  Status:  Discontinued        1,750 mg 175 mL/hr over 120 Minutes Intravenous Every 24 hours 05/07/22 0813 05/09/22 1545   05/06/22 1400  ceFEPIme (MAXIPIME) 2 g in sodium chloride 0.9 % 100 mL IVPB  Status:  Discontinued        2 g 200 mL/hr over 30 Minutes Intravenous Every 8 hours 05/06/22 0919 05/08/22 1317   05/06/22 1000  metroNIDAZOLE (FLAGYL) tablet  500 mg        500 mg Oral Every 12 hours 05/06/22 0907     05/05/22 1800  vancomycin (VANCOREADY) IVPB 1250 mg/250 mL  Status:  Discontinued        1,250 mg 166.7 mL/hr over 90 Minutes Intravenous Every 24 hours 05/04/22 1906 05/07/22 0813   05/05/22 0600  ceFAZolin (ANCEF) IVPB 2g/100 mL premix        2 g 200 mL/hr over 30 Minutes Intravenous On call to O.R. 05/04/22 2123 05/05/22 1152   05/04/22 2300  piperacillin-tazobactam (ZOSYN) IVPB 3.375 g  Status:  Discontinued        3.375  g 12.5 mL/hr over 240 Minutes Intravenous Every 8 hours 05/04/22 1729 05/06/22 0907   05/04/22 1500  ceFEPIme (MAXIPIME) 2 g in sodium chloride 0.9 % 100 mL IVPB  Status:  Discontinued        2 g 200 mL/hr over 30 Minutes Intravenous Every 8 hours 05/04/22 1450 05/04/22 1723   05/04/22 1500  vancomycin (VANCOREADY) IVPB 2000 mg/400 mL        2,000 mg 200 mL/hr over 120 Minutes Intravenous  Once 05/04/22 1450 05/04/22 2053       Subjective: No new complaints Discussed recommendations for amputation extensively He wants to exhaust all options, family at bedside  Objective: Vitals:   05/08/22 2057 05/09/22 0540 05/09/22 0745 05/09/22 1659  BP: (!) 141/77 (!) 152/84 128/90 (!) 164/91  Pulse: 88 85 87 79  Resp: 16 20 16 16   Temp: 98.8 F (37.1 C) 99.1 F (37.3 C) 99.5 F (37.5 C) 98.1 F (36.7 C)  TempSrc: Oral Oral Oral Oral  SpO2: 100% 98% 99% 100%  Weight:      Height:        Intake/Output Summary (Last 24 hours) at 05/09/2022 1659 Last data filed at 05/09/2022 0300 Gross per 24 hour  Intake 450 ml  Output 500 ml  Net -50 ml   Filed Weights   05/04/22 1332 05/04/22 2132 05/05/22 0950  Weight: 106.6 kg 100.9 kg 100.9 kg    Examination:  General: No acute distress. Lungs: unlabored Neurological: Alert and oriented 3. Moves all extremities 4 with equal strength. Cranial nerves II through XII grossly intact. Skin: wound shown to patient today      Data Reviewed: I have personally reviewed following labs and imaging studies  CBC: Recent Labs  Lab 05/05/22 0622 05/06/22 0220 05/07/22 0123 05/08/22 0350 05/09/22 0133  WBC 19.0* 21.5* 22.3* 10.4 11.8*  NEUTROABS  --  19.7* 18.5* 6.5 7.9*  HGB 10.5* 10.2* 10.4* 10.1* 9.8*  HCT 31.7* 32.3* 32.6* 31.4* 30.2*  MCV 82.8 84.1 83.4 82.8 82.1  PLT 251 281 305 313 [id number]    Basic Metabolic Panel: Recent Labs  Lab 05/06/22 0220 05/06/22 1558 05/07/22 0123 05/08/22 0350 05/09/22 0133  NA 131* 130* 135 136 134*   K 5.2* 4.4 3.6 4.8 4.8  CL 99 99 105 106 105  CO2 24 22 23 25 25   GLUCOSE 441* 413* 99 421* 394*  BUN 30* 32* 28* 18 12  CREATININE 1.43* 1.34* 1.18 1.24 1.23  CALCIUM 8.6* 8.2* 8.6* 8.2* 8.2*  MG 2.1  --  2.3 1.9 1.9  PHOS 2.8  --  2.3* 3.0 2.7    GFR: Estimated Creatinine Clearance: 79.5 mL/min (by C-G formula based on SCr of 1.23 mg/dL).  Liver Function Tests: Recent Labs  Lab 05/06/22 0220 05/07/22 0123 05/08/22 0350 05/09/22 0133  AST 11* 11*  9* 17  ALT 9 9 7 13   ALKPHOS 81 71 61 59  BILITOT 0.2* 0.2* 0.1* 0.5  PROT 6.4* 6.7 5.8* 5.8*  ALBUMIN 2.4* 2.7* 2.1* 2.2*    CBG: Recent Labs  Lab 05/08/22 1133 05/08/22 1620 05/08/22 2058 05/09/22 0743 05/09/22 1231  GLUCAP 103* 281* 194* 309* 132*     Recent Results (from the past 240 hour(s))  Blood culture (routine x 2)     Status: None   Collection Time: 05/04/22  2:44 PM   Specimen: BLOOD  Result Value Ref Range Status   Specimen Description BLOOD SITE NOT SPECIFIED  Final   Special Requests   Final    BOTTLES DRAWN AEROBIC AND ANAEROBIC Blood Culture adequate volume   Culture   Final    NO GROWTH 5 DAYS Performed at [facility], [place] [address] [place], [place] [address]    Report Status 05/09/2022 FINAL  Final  Blood culture (routine x 2)     Status: None   Collection Time: 05/04/22  2:55 PM   Specimen: BLOOD LEFT ARM  Result Value Ref Range Status   Specimen Description BLOOD LEFT ARM  Final   Special Requests   Final    BOTTLES DRAWN AEROBIC AND ANAEROBIC Blood Culture adequate volume   Culture   Final    NO GROWTH 5 DAYS Performed at [facility], [place] [address] [place], [place] [address]    Report Status 05/09/2022 FINAL  Final  Aerobic/Anaerobic Culture w Gram Stain (surgical/deep wound)     Status: None   Collection Time: 05/04/22  3:25 PM   Specimen: Foot  Result Value Ref Range Status   Specimen Description FOOT  Final   Special Requests NONE  Final   Gram Stain   Final     RARE WBC PRESENT, PREDOMINANTLY PMN FEW GRAM POSITIVE COCCI IN PAIRS RARE GRAM NEGATIVE RODS    Culture   Final    MODERATE STREPTOCOCCUS GALLOLYTICUS RARE METHICILLIN RESISTANT STAPHYLOCOCCUS AUREUS RARE KLEBSIELLA OXYTOCA FEW PREVOTELLA SPECIES BETA LACTAMASE POSITIVE Performed at [facility], [place] [address] [place], [place] [address]    Report Status 05/08/2022 FINAL  Final   Organism ID, Bacteria STREPTOCOCCUS GALLOLYTICUS  Final   Organism ID, Bacteria METHICILLIN RESISTANT STAPHYLOCOCCUS AUREUS  Final   Organism ID, Bacteria KLEBSIELLA OXYTOCA  Final      Susceptibility   Klebsiella oxytoca - MIC*    AMPICILLIN >=32 RESISTANT Resistant     CEFAZOLIN <=4 SENSITIVE Sensitive     CEFEPIME <=0.12 SENSITIVE Sensitive     CEFTAZIDIME <=1 SENSITIVE Sensitive     CEFTRIAXONE <=0.25 SENSITIVE Sensitive     CIPROFLOXACIN <=0.25 SENSITIVE Sensitive     GENTAMICIN <=1 SENSITIVE Sensitive     IMIPENEM <=0.25 SENSITIVE Sensitive     TRIMETH/SULFA >=320 RESISTANT Resistant     AMPICILLIN/SULBACTAM 16 INTERMEDIATE Intermediate     PIP/TAZO <=4 SENSITIVE Sensitive     * RARE KLEBSIELLA OXYTOCA   Methicillin resistant staphylococcus aureus - MIC*    CIPROFLOXACIN >=8 RESISTANT Resistant     ERYTHROMYCIN >=8 RESISTANT Resistant     GENTAMICIN <=0.5 SENSITIVE Sensitive     OXACILLIN >=4 RESISTANT Resistant     TETRACYCLINE <=1 SENSITIVE Sensitive     VANCOMYCIN 1 SENSITIVE Sensitive     TRIMETH/SULFA >=320 RESISTANT Resistant     CLINDAMYCIN <=0.25 SENSITIVE Sensitive     RIFAMPIN <=0.5 SENSITIVE Sensitive     Inducible Clindamycin NEGATIVE  Sensitive     * RARE METHICILLIN RESISTANT STAPHYLOCOCCUS AUREUS   Streptococcus gallolyticus - MIC*    PENICILLIN 0.12 SENSITIVE Sensitive     * MODERATE STREPTOCOCCUS GALLOLYTICUS  Surgical PCR screen     Status: Abnormal   Collection Time: 05/05/22 12:58 AM   Specimen: Nasal Mucosa; Nasal Swab  Result Value Ref Range Status    MRSA, PCR POSITIVE (Shanecia Hoganson) NEGATIVE Final    Comment: RESULT CALLED TO, READ BACK BY AND VERIFIED WITH: RN LANGSTON O. 05/05/22@2 :26 BY TW    Staphylococcus aureus POSITIVE (Stanlee Roehrig) NEGATIVE Final    Comment: (NOTE) The Xpert SA Assay (FDA approved for NASAL specimens in patients 34 years of age and older), is one component of [facility] comprehensive surveillance program. It is not intended to diagnose infection nor to guide or monitor treatment. Performed at [facility], [place] [address], [place], [place] [address]   Aerobic/Anaerobic Culture w Gram Stain (surgical/deep wound)     Status: None   Collection Time: 05/05/22 12:01 PM   Specimen: Soft Tissue, Other  Result Value Ref Range Status   Specimen Description TISSUE  Final   Special Requests TISSUE BOTTOM LEFT FOOT  Final   Gram Stain   Final    NO WBC SEEN ABUNDANT GRAM POSITIVE COCCI ABUNDANT GRAM NEGATIVE RODS RARE GRAM POSITIVE RODS    Culture   Final    RARE KLEBSIELLA OXYTOCA MODERATE STREPTOCOCCUS GALLOLYTICUS FEW STAPHYLOCOCCUS AUREUS FEW PREVOTELLA SPECIES BETA LACTAMASE POSITIVE Performed at [facility], [place] [address] [place],  [address]    Report Status 05/08/2022 FINAL  Final   Organism ID, Bacteria KLEBSIELLA OXYTOCA  Final   Organism ID, Bacteria STREPTOCOCCUS GALLOLYTICUS  Final   Organism ID, Bacteria STAPHYLOCOCCUS AUREUS  Final      Susceptibility   Klebsiella oxytoca - MIC*    AMPICILLIN >=32 RESISTANT Resistant     CEFAZOLIN <=4 SENSITIVE Sensitive     CEFEPIME <=0.12 SENSITIVE Sensitive     CEFTAZIDIME <=1 SENSITIVE Sensitive     CEFTRIAXONE <=0.25 SENSITIVE Sensitive     CIPROFLOXACIN <=0.25 SENSITIVE Sensitive     GENTAMICIN <=1 SENSITIVE Sensitive     IMIPENEM <=0.25 SENSITIVE Sensitive     TRIMETH/SULFA 40 SENSITIVE Sensitive     AMPICILLIN/SULBACTAM 16 INTERMEDIATE Intermediate     PIP/TAZO <=4 SENSITIVE Sensitive     * RARE KLEBSIELLA OXYTOCA   Staphylococcus aureus - MIC*     CIPROFLOXACIN <=0.5 SENSITIVE Sensitive     ERYTHROMYCIN <=0.25 SENSITIVE Sensitive     GENTAMICIN <=0.5 SENSITIVE Sensitive     OXACILLIN <=0.25 SENSITIVE Sensitive     TETRACYCLINE <=1 SENSITIVE Sensitive     VANCOMYCIN 1 SENSITIVE Sensitive     TRIMETH/SULFA <=10 SENSITIVE Sensitive     CLINDAMYCIN <=0.25 SENSITIVE Sensitive     RIFAMPIN <=0.5 SENSITIVE Sensitive     Inducible Clindamycin NEGATIVE Sensitive     * FEW STAPHYLOCOCCUS AUREUS   Streptococcus gallolyticus - MIC*    PENICILLIN 0.12 SENSITIVE Sensitive     CEFTRIAXONE 0.25 SENSITIVE Sensitive     ERYTHROMYCIN <=0.12 SENSITIVE Sensitive     LEVOFLOXACIN 2 SENSITIVE Sensitive     VANCOMYCIN 0.25 SENSITIVE Sensitive     * MODERATE STREPTOCOCCUS GALLOLYTICUS  Aerobic/Anaerobic Culture w Gram Stain (surgical/deep wound)     Status: None (Preliminary result)   Collection Time: 05/05/22 12:02 PM   Specimen: Soft Tissue, Other  Result Value Ref Range Status   Specimen Description FOOT LEFT  Final   Special Requests SWAB BOTTOM LEFT FOOT SPEC B  Final   Gram Stain   Final    RARE WBC PRESENT,BOTH PMN AND MONONUCLEAR ABUNDANT GRAM POSITIVE COCCI ABUNDANT GRAM NEGATIVE RODS    Culture   Final    FEW STREPTOCOCCUS GALLOLYTICUS FEW STAPHYLOCOCCUS AUREUS FEW STREPTOCOCCUS GORDONII HOLDING FOR POSSIBLE ANAEROBE Performed at [facility], [place] [address], [place], [place] [address]    Report Status PENDING  Incomplete   Organism ID, Bacteria STAPHYLOCOCCUS AUREUS  Final   Organism ID, Bacteria STREPTOCOCCUS GORDONII  Final      Susceptibility   Streptococcus gordonii - MIC*    PENICILLIN <=0.06 SENSITIVE Sensitive     CEFTRIAXONE <=0.12 SENSITIVE Sensitive     ERYTHROMYCIN 2 RESISTANT Resistant     LEVOFLOXACIN 0.5 SENSITIVE Sensitive     VANCOMYCIN 0.5 SENSITIVE Sensitive     * FEW STREPTOCOCCUS GORDONII   Staphylococcus aureus - MIC*    CIPROFLOXACIN <=0.5 SENSITIVE Sensitive     ERYTHROMYCIN <=0.25 SENSITIVE  Sensitive     GENTAMICIN <=0.5 SENSITIVE Sensitive     OXACILLIN <=0.25 SENSITIVE Sensitive     TETRACYCLINE <=1 SENSITIVE Sensitive     VANCOMYCIN 1 SENSITIVE Sensitive     TRIMETH/SULFA <=10 SENSITIVE Sensitive     CLINDAMYCIN <=0.25 SENSITIVE Sensitive     RIFAMPIN <=0.5 SENSITIVE Sensitive     Inducible Clindamycin NEGATIVE Sensitive     * FEW STAPHYLOCOCCUS AUREUS         Radiology Studies: No results found.      Scheduled Meds:  acidophilus  2 capsule Oral Daily   docusate sodium  100 mg Oral BID   enoxaparin (LOVENOX) injection  50 mg Subcutaneous Q24H   insulin aspart  0-20 Units Subcutaneous TID WC   insulin aspart  0-5 Units Subcutaneous QHS   insulin aspart  5 Units Subcutaneous TID WC   insulin detemir  15 Units Subcutaneous BID   metroNIDAZOLE  500 mg Oral Q12H   mupirocin ointment  1 application. Nasal BID   nicotine  21 mg Transdermal Daily   Continuous Infusions:  sodium chloride Stopped (05/06/22 1238)   [START ON 05/10/2022]  ceFAZolin (ANCEF) IV     methocarbamol (ROBAXIN) IV       LOS: 5 days    Time spent: over 30 min    Fayrene Helper, MD [facility]   To contact the attending provider between 7A-7P or the covering provider during after hours 7P-7A, please log into the web site [email or web address] and access using universal [place] password for that web site. If you do not have the password, please call the hospital operator.  05/09/2022, 4:59 PM

## 2022-05-10 ENCOUNTER — Inpatient Hospital Stay: Payer: Self-pay

## 2022-05-10 ENCOUNTER — Other Ambulatory Visit (HOSPITAL_COMMUNITY): Payer: Self-pay

## 2022-05-10 DIAGNOSIS — Z720 Tobacco use: Secondary | ICD-10-CM

## 2022-05-10 DIAGNOSIS — N179 Acute kidney failure, unspecified: Secondary | ICD-10-CM

## 2022-05-10 DIAGNOSIS — E875 Hyperkalemia: Secondary | ICD-10-CM

## 2022-05-10 LAB — AEROBIC/ANAEROBIC CULTURE W GRAM STAIN (SURGICAL/DEEP WOUND)

## 2022-05-10 LAB — GLUCOSE, CAPILLARY
Glucose-Capillary: 275 mg/dL — ABNORMAL HIGH (ref 70–99)
Glucose-Capillary: 73 mg/dL (ref 70–99)
Glucose-Capillary: 312 mg/dL — ABNORMAL HIGH (ref 70–99)
Glucose-Capillary: 152 mg/dL — ABNORMAL HIGH (ref 70–99)

## 2022-05-10 MED ORDER — INSULIN ASPART 100 UNIT/ML IJ SOLN: 8.0000 [IU] | Freq: Three times a day (TID) | INTRAMUSCULAR | Status: DC

## 2022-05-10 MED ORDER — INSULIN ASPART PROT & ASPART (70-30 MIX) 100 UNIT/ML ~~LOC~~ SUSP
25.0000 [IU] | Freq: Two times a day (BID) | SUBCUTANEOUS | Status: DC
  Administered 2022-05-10 – 2022-05-11 (×3): 25 [IU] via SUBCUTANEOUS
  Filled 2022-05-10 (×3): qty 10

## 2022-05-10 MED ORDER — SODIUM CHLORIDE 0.9% FLUSH: 10.0000 mL | INTRAVENOUS | Status: DC | PRN

## 2022-05-10 MED ORDER — METRONIDAZOLE 500 MG PO TABS
500.0000 mg | ORAL_TABLET | Freq: Two times a day (BID) | ORAL | 0 refills | Status: AC
  Filled 2022-05-10: qty 74, 37d supply, fill #0

## 2022-05-10 MED ORDER — CEFAZOLIN IV (FOR PTA / DISCHARGE USE ONLY): 2.0000 g | Freq: Three times a day (TID) | INTRAVENOUS | 0 refills | Status: AC

## 2022-05-10 MED ORDER — CHLORHEXIDINE GLUCONATE CLOTH 2 % EX PADS
6.0000 | MEDICATED_PAD | Freq: Every day | CUTANEOUS | Status: DC
  Administered 2022-05-10 – 2022-05-11 (×2): 6 via TOPICAL

## 2022-05-10 NOTE — Progress Notes (Addendum)
PROGRESS NOTE    [name]  [id number] DOB: 02/09/1966 DOA: 05/04/2022 PCP: Patient, No Pcp Per (Inactive)    Chief Complaint  Patient presents with   Foot Pain    Brief Narrative:  [name] is a 56 y.o. male with medical history significant of left foot diabetic ulcer, IDDM, came with left foot pain and abscess.  S/p debridement by Dr. Sharol Given on 6/2.  Recommending amputation, patient not interested at this time.  Id c/s for abx recommendations.  Will need IV abx for home and wound center referral.     Assessment & Plan:  Principal Problem:   Diabetic foot ulcer ([place]) Active Problems:   AKI (acute kidney injury) ([place])   Type 2 diabetes mellitus ([place])   Obesity (BMI 30-39.9)   Tobacco abuse   Hyperkalemia   Cellulitis and abscess of foot    Assessment and Plan: * Diabetic foot ulcer ([place]) MRI L foot with nonspecific low grade edema and enhancement along plantar side of head of 5th metatarsal - arthropathy vs early osteo Extensive gas in plantar soft tissues - extension to involve the plantar portion of flexor digitorum brevis muscle indicative of myositis and gas producing soft tissue infection Blood cultures with no growth to date x5 days. Aerobic/anaerobic cx MRSA, strep gallolyticus, klebsiella oxytoca,  Follow operative cultures -> klebsiella oxytoca, strep gallolyticus, strep gordonii, MRSA CRP 26.4, sed rate 107 Leukocytosis improved Now s/p left foot debridement by Dr. Sharol Given, pt had extensive necrotic skin and soft tissue and muscle.  No soft tissue coverage of the meta tarsal heads.  Patient refused amputation recommended by Dr. Sharol Given. ID c/s for abx recommendations Discussed wound care with Dr. Sharol Given (Patient can wash his foot with soap and water apply a dry dressing, minimize weightbearing.  Ideally nonweightbearing.) -Patient will be set up for the wound care center. -IV antibiotics narrowed down to cefazolin and metronidazole. -Vancomycin discontinued per ID  as only foot swab grew MRSA. -Patient will need 6 weeks IV antibiotics with ID follow-up. -We will order PICC line to be placed.  AKI (acute kidney injury) ([place]) Baseline creatinine 1.1 Elevated to 1.67 on presentation improved with hydration. -Saline lock IV fluids. Renal US without hydro ->thickening of bladder wall, will follow UA ([place]) Related to NSAIDs?  Type 2 diabetes mellitus ([place]) SSI Adjust as needed hemoglobin A1c 11.5 (05/07/2022 ) -CBG 275 this morning.   -Change Semglee  to home regimen of 70/30 and increase to 25 units twice daily. -Discontinue meal coverage NovoLog.  Obesity (BMI 30-39.9) noted  Tobacco abuse Nicotine patch, gum Doesn't seem ready to quit  Hyperkalemia resolved         DVT prophylaxis: Lovenox Code Status: Full Family Communication: Updated patient and brother at bedside. Disposition: Home with home health therapies once PICC line placed hopefully tomorrow.  Status is: Inpatient Remains inpatient appropriate because: Severity of illness.   Consultants:  Orthopedics: Dr. Sharol Given 05/05/2022 Infectious disease: Dr. Candiss Norse 05/09/2022  Procedures:  Plain films left foot 05/04/2022 MRI left foot 05/05/2022 Renal ultrasound 05/06/2022 Left foot debridement per orthopedics: Dr. Sharol Given 05/05/2022-noted to have extensive necrotic soft tissue and muscle, tissue sent for cultures, did not have foot salvage options.   Antimicrobials:  Oral Flagyl 05/06/2022>>>> IV cefepime 05/06/2022>>>> 05/08/2022 IV cefepime 05/04/2022 x1 dose IV Rocephin 05/08/2022>>>> 05/09/2022 IV Ancef 05/10/2022>>>>> IV Zosyn 05/04/2022>>>> 05/06/2022 IV vancomycin 05/04/2022>>>>> 05/09/2022    Subjective: Patient sitting up in chair.  Denies any chest pain.  No shortness of breath.  No abdominal pain.  Still adamant does not want amputation at this point in time.  Brother at bedside.  Objective: Vitals:   05/09/22 1948 05/10/22 0423 05/10/22 0805 05/10/22 1613  BP: (!) 143/99 129/78 132/81  (!) 141/82  Pulse: 88 81 82 85  Resp: 16 16 17 17   Temp: 99.7 F (37.6 C) 98.1 F (36.7 C) 98.4 F (36.9 C) 98 F (36.7 C)  TempSrc: Oral Oral Oral Oral  SpO2: 100% 99% 100% 97%  Weight:      Height:        Intake/Output Summary (Last 24 hours) at 05/10/2022 1818 Last data filed at 05/09/2022 2335 Gross per 24 hour  Intake 220 ml  Output --  Net 220 ml   Filed Weights   05/04/22 1332 05/04/22 2132 05/05/22 0950  Weight: 106.6 kg 100.9 kg 100.9 kg    Examination:  General exam: Appears calm and comfortable  Respiratory system: Clear to auscultation. Respiratory effort normal. Cardiovascular system: S1 & S2 heard, RRR. No JVD, murmurs, rubs, gallops or clicks. No pedal edema. Gastrointestinal system: Abdomen is nondistended, soft and nontender. No organomegaly or masses felt. Normal bowel sounds heard. Central nervous system: Alert and oriented. No focal neurological deficits. Extremities: Left foot wrapped in bandage. Skin: No rashes, lesions or ulcers Psychiatry: Judgement and insight appear normal. Mood & affect appropriate.      Data Reviewed:   CBC: Recent Labs  Lab 05/05/22 0622 05/06/22 0220 05/07/22 0123 05/08/22 0350 05/09/22 0133  WBC 19.0* 21.5* 22.3* 10.4 11.8*  NEUTROABS  --  19.7* 18.5* 6.5 7.9*  HGB 10.5* 10.2* 10.4* 10.1* 9.8*  HCT 31.7* 32.3* 32.6* 31.4* 30.2*  MCV 82.8 84.1 83.4 82.8 82.1  PLT 251 281 305 313 [id number]    Basic Metabolic Panel: Recent Labs  Lab 05/06/22 0220 05/06/22 1558 05/07/22 0123 05/08/22 0350 05/09/22 0133  NA 131* 130* 135 136 134*  K 5.2* 4.4 3.6 4.8 4.8  CL 99 99 105 106 105  CO2 24 22 23 25 25   GLUCOSE 441* 413* 99 421* 394*  BUN 30* 32* 28* 18 12  CREATININE 1.43* 1.34* 1.18 1.24 1.23  CALCIUM 8.6* 8.2* 8.6* 8.2* 8.2*  MG 2.1  --  2.3 1.9 1.9  PHOS 2.8  --  2.3* 3.0 2.7    GFR: Estimated Creatinine Clearance: 79.5 mL/min (by C-G formula based on SCr of 1.23 mg/dL).  Liver Function Tests: Recent Labs   Lab 05/06/22 0220 05/07/22 0123 05/08/22 0350 05/09/22 0133  AST 11* 11* 9* 17  ALT 9 9 7 13   ALKPHOS 81 71 61 59  BILITOT 0.2* 0.2* 0.1* 0.5  PROT 6.4* 6.7 5.8* 5.8*  ALBUMIN 2.4* 2.7* 2.1* 2.2*    CBG: Recent Labs  Lab 05/09/22 1806 05/09/22 2020 05/10/22 0803 05/10/22 1155 05/10/22 1615  GLUCAP 220* 223* 275* 73 312*     Recent Results (from the past 240 hour(s))  Blood culture (routine x 2)     Status: None   Collection Time: 05/04/22  2:44 PM   Specimen: BLOOD  Result Value Ref Range Status   Specimen Description BLOOD SITE NOT SPECIFIED  Final   Special Requests   Final    BOTTLES DRAWN AEROBIC AND ANAEROBIC Blood Culture adequate volume   Culture   Final    NO GROWTH 5 DAYS Performed at [facility], [place] [address] [place], [place] [address]    Report Status 05/09/2022 FINAL  Final  Blood culture (routine  x 2)     Status: None   Collection Time: 05/04/22  2:55 PM   Specimen: BLOOD LEFT ARM  Result Value Ref Range Status   Specimen Description BLOOD LEFT ARM  Final   Special Requests   Final    BOTTLES DRAWN AEROBIC AND ANAEROBIC Blood Culture adequate volume   Culture   Final    NO GROWTH 5 DAYS Performed at [facility], [address] [place], [place] [address]    Report Status 05/09/2022 FINAL  Final  Aerobic/Anaerobic Culture w Gram Stain (surgical/deep wound)     Status: None   Collection Time: 05/04/22  3:25 PM   Specimen: Foot  Result Value Ref Range Status   Specimen Description FOOT  Final   Special Requests NONE  Final   Gram Stain   Final    RARE WBC PRESENT, PREDOMINANTLY PMN FEW GRAM POSITIVE COCCI IN PAIRS RARE GRAM NEGATIVE RODS    Culture   Final    MODERATE STREPTOCOCCUS GALLOLYTICUS RARE METHICILLIN RESISTANT STAPHYLOCOCCUS AUREUS RARE KLEBSIELLA OXYTOCA FEW PREVOTELLA SPECIES BETA LACTAMASE POSITIVE Performed at [facility], [place] [address] [place], [place] [address]    Report Status 05/08/2022 FINAL   Final   Organism ID, Bacteria STREPTOCOCCUS GALLOLYTICUS  Final   Organism ID, Bacteria METHICILLIN RESISTANT STAPHYLOCOCCUS AUREUS  Final   Organism ID, Bacteria KLEBSIELLA OXYTOCA  Final      Susceptibility   Klebsiella oxytoca - MIC*    AMPICILLIN >=32 RESISTANT Resistant     CEFAZOLIN <=4 SENSITIVE Sensitive     CEFEPIME <=0.12 SENSITIVE Sensitive     CEFTAZIDIME <=1 SENSITIVE Sensitive     CEFTRIAXONE <=0.25 SENSITIVE Sensitive     CIPROFLOXACIN <=0.25 SENSITIVE Sensitive     GENTAMICIN <=1 SENSITIVE Sensitive     IMIPENEM <=0.25 SENSITIVE Sensitive     TRIMETH/SULFA >=320 RESISTANT Resistant     AMPICILLIN/SULBACTAM 16 INTERMEDIATE Intermediate     PIP/TAZO <=4 SENSITIVE Sensitive     * RARE KLEBSIELLA OXYTOCA   Methicillin resistant staphylococcus aureus - MIC*    CIPROFLOXACIN >=8 RESISTANT Resistant     ERYTHROMYCIN >=8 RESISTANT Resistant     GENTAMICIN <=0.5 SENSITIVE Sensitive     OXACILLIN >=4 RESISTANT Resistant     TETRACYCLINE <=1 SENSITIVE Sensitive     VANCOMYCIN 1 SENSITIVE Sensitive     TRIMETH/SULFA >=320 RESISTANT Resistant     CLINDAMYCIN <=0.25 SENSITIVE Sensitive     RIFAMPIN <=0.5 SENSITIVE Sensitive     Inducible Clindamycin NEGATIVE Sensitive     * RARE METHICILLIN RESISTANT STAPHYLOCOCCUS AUREUS   Streptococcus gallolyticus - MIC*    PENICILLIN 0.12 SENSITIVE Sensitive     * MODERATE STREPTOCOCCUS GALLOLYTICUS  Surgical PCR screen     Status: Abnormal   Collection Time: 05/05/22 12:58 AM   Specimen: Nasal Mucosa; Nasal Swab  Result Value Ref Range Status   MRSA, PCR POSITIVE (A) NEGATIVE Final    Comment: RESULT CALLED TO, READ BACK BY AND VERIFIED WITH: RN LANGSTON O. 05/05/22@2 :26 BY TW    Staphylococcus aureus POSITIVE (A) NEGATIVE Final    Comment: (NOTE) The Xpert SA Assay (FDA approved for NASAL specimens in patients 86 years of age and older), is one component of a comprehensive surveillance program. It is not intended to diagnose  infection nor to guide or monitor treatment. Performed at [facility], [place] [address] [name], [place] [address]   Aerobic/Anaerobic Culture w Gram Stain (surgical/deep wound)  Status: None   Collection Time: 05/05/22 12:01 PM   Specimen: Soft Tissue, Other  Result Value Ref Range Status   Specimen Description TISSUE  Final   Special Requests TISSUE BOTTOM LEFT FOOT  Final   Gram Stain   Final    NO WBC SEEN ABUNDANT GRAM POSITIVE COCCI ABUNDANT GRAM NEGATIVE RODS RARE GRAM POSITIVE RODS    Culture   Final    RARE KLEBSIELLA OXYTOCA MODERATE STREPTOCOCCUS GALLOLYTICUS FEW STAPHYLOCOCCUS AUREUS FEW PREVOTELLA SPECIES BETA LACTAMASE POSITIVE Performed at [facility], [place] [address] [place], [place] [address]    Report Status 05/08/2022 FINAL  Final   Organism ID, Bacteria KLEBSIELLA OXYTOCA  Final   Organism ID, Bacteria STREPTOCOCCUS GALLOLYTICUS  Final   Organism ID, Bacteria STAPHYLOCOCCUS AUREUS  Final      Susceptibility   Klebsiella oxytoca - MIC*    AMPICILLIN >=32 RESISTANT Resistant     CEFAZOLIN <=4 SENSITIVE Sensitive     CEFEPIME <=0.12 SENSITIVE Sensitive     CEFTAZIDIME <=1 SENSITIVE Sensitive     CEFTRIAXONE <=0.25 SENSITIVE Sensitive     CIPROFLOXACIN <=0.25 SENSITIVE Sensitive     GENTAMICIN <=1 SENSITIVE Sensitive     IMIPENEM <=0.25 SENSITIVE Sensitive     TRIMETH/SULFA 40 SENSITIVE Sensitive     AMPICILLIN/SULBACTAM 16 INTERMEDIATE Intermediate     PIP/TAZO <=4 SENSITIVE Sensitive     * RARE KLEBSIELLA OXYTOCA   Staphylococcus aureus - MIC*    CIPROFLOXACIN <=0.5 SENSITIVE Sensitive     ERYTHROMYCIN <=0.25 SENSITIVE Sensitive     GENTAMICIN <=0.5 SENSITIVE Sensitive     OXACILLIN <=0.25 SENSITIVE Sensitive     TETRACYCLINE <=1 SENSITIVE Sensitive     VANCOMYCIN 1 SENSITIVE Sensitive     TRIMETH/SULFA <=10 SENSITIVE Sensitive     CLINDAMYCIN <=0.25 SENSITIVE Sensitive     RIFAMPIN <=0.5 SENSITIVE Sensitive     Inducible  Clindamycin NEGATIVE Sensitive     * FEW STAPHYLOCOCCUS AUREUS   Streptococcus gallolyticus - MIC*    PENICILLIN 0.12 SENSITIVE Sensitive     CEFTRIAXONE 0.25 SENSITIVE Sensitive     ERYTHROMYCIN <=0.12 SENSITIVE Sensitive     LEVOFLOXACIN 2 SENSITIVE Sensitive     VANCOMYCIN 0.25 SENSITIVE Sensitive     * MODERATE STREPTOCOCCUS GALLOLYTICUS  Aerobic/Anaerobic Culture w Gram Stain (surgical/deep wound)     Status: None   Collection Time: 05/05/22 12:02 PM   Specimen: Soft Tissue, Other  Result Value Ref Range Status   Specimen Description FOOT LEFT  Final   Special Requests SWAB BOTTOM LEFT FOOT SPEC B  Final   Gram Stain   Final    RARE WBC PRESENT,BOTH PMN AND MONONUCLEAR ABUNDANT GRAM POSITIVE COCCI ABUNDANT GRAM NEGATIVE RODS    Culture   Final    FEW STREPTOCOCCUS GALLOLYTICUS FEW STAPHYLOCOCCUS AUREUS FEW STREPTOCOCCUS GORDONII FEW PREVOTELLA SPECIES BETA LACTAMASE POSITIVE Performed at [facility], [address], [place], [place] [address]    Report Status 05/10/2022 FINAL  Final   Organism ID, Bacteria STAPHYLOCOCCUS AUREUS  Final   Organism ID, Bacteria STREPTOCOCCUS GORDONII  Final   Organism ID, Bacteria STREPTOCOCCUS GALLOLYTICUS  Final      Susceptibility   Streptococcus gordonii - MIC*    PENICILLIN <=0.06 SENSITIVE Sensitive     CEFTRIAXONE <=0.12 SENSITIVE Sensitive     ERYTHROMYCIN 2 RESISTANT Resistant     LEVOFLOXACIN 0.5 SENSITIVE Sensitive     VANCOMYCIN 0.5 SENSITIVE Sensitive     * FEW STREPTOCOCCUS  GORDONII   Staphylococcus aureus - MIC*    CIPROFLOXACIN <=0.5 SENSITIVE Sensitive     ERYTHROMYCIN <=0.25 SENSITIVE Sensitive     GENTAMICIN <=0.5 SENSITIVE Sensitive     OXACILLIN <=0.25 SENSITIVE Sensitive     TETRACYCLINE <=1 SENSITIVE Sensitive     VANCOMYCIN 1 SENSITIVE Sensitive     TRIMETH/SULFA <=10 SENSITIVE Sensitive     CLINDAMYCIN <=0.25 SENSITIVE Sensitive     RIFAMPIN <=0.5 SENSITIVE Sensitive     Inducible Clindamycin  NEGATIVE Sensitive     * FEW STAPHYLOCOCCUS AUREUS   Streptococcus gallolyticus - MIC*    PENICILLIN 2 INTERMEDIATE Intermediate     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     ERYTHROMYCIN 2 RESISTANT Resistant     LEVOFLOXACIN 0.5 SENSITIVE Sensitive     * FEW STREPTOCOCCUS GALLOLYTICUS         Radiology Studies: [place] EKG SITE RITE  Result Date: 05/10/2022 If Site Rite image not attached, placement could not be confirmed due to current cardiac rhythm.       Scheduled Meds:  acidophilus  2 capsule Oral Daily   Chlorhexidine Gluconate Cloth  6 each Topical Daily   docusate sodium  100 mg Oral BID   enoxaparin (LOVENOX) injection  50 mg Subcutaneous Q24H   insulin aspart  0-20 Units Subcutaneous TID WC   insulin aspart  0-5 Units Subcutaneous QHS   insulin aspart protamine- aspart  25 Units Subcutaneous BID WC   metroNIDAZOLE  500 mg Oral Q12H   nicotine  21 mg Transdermal Daily   Continuous Infusions:  sodium chloride Stopped (05/06/22 1238)    ceFAZolin (ANCEF) IV 2 g (05/10/22 1520)   methocarbamol (ROBAXIN) IV       LOS: 6 days    Time spent: 40 minutes    Irine Seal, MD [facility]   To contact the attending provider between 7A-7P or the covering provider during after hours 7P-7A, please log into the web site [email or web address] and access using universal [place] password for that web site. If you do not have the password, please call the hospital operator.  05/10/2022, 6:18 PM

## 2022-05-10 NOTE — TOC Progression Note (Addendum)
Transition of Care [facility]) - Progression Note    Patient Details  Name: [name] MRN: [id number] Date of Birth: 01-Dec-1966  Transition of Care [facility]) CM/SW Contact  Lawerance Sabal, RN Phone Number: 05/10/2022, 11:29 AM  Clinical Narrative:    Notified Pam w [facility] that OPAT order in place. Reached out to [employer] (current charity provider for [facility] services) to check on referral, awaiting response. Made appointment for 6/15 at 1:15 at [facility], added to AVS.   [facility] unable to accept. Requesting LOG from leadership for [facility] RN.    Expected Discharge Plan: Home/Self Care Barriers to Discharge: Continued Medical Work up  Expected Discharge Plan and Services Expected Discharge Plan: Home/Self Care   Discharge Planning Services: CM Consult   Living arrangements for the past 2 months: Single Family Home                 DME Arranged: N/A         HH Arranged: NA           Social Determinants of Health (SDOH) Interventions    Readmission Risk Interventions     View : No data to display.

## 2022-05-10 NOTE — Progress Notes (Signed)
Inpatient Diabetes Program Recommendations  AACE/ADA: New Consensus Statement on Inpatient Glycemic Control (2015)  Target Ranges:  Prepandial:   less than 140 mg/dL      Peak postprandial:   less than 180 mg/dL (1-2 hours)      Critically ill patients:  140 - 180 mg/dL   Lab Results  Component Value Date   GLUCAP 73 05/10/2022   HGBA1C 11.5 (H) 05/07/2022    Review of Glycemic Control  Latest Reference Range & Units 05/09/22 07:43 05/09/22 12:31 05/09/22 18:06 05/09/22 20:20 05/10/22 08:03 05/10/22 11:55  Glucose-Capillary 70 - 99 mg/dL 309 (H) 132 (H) 220 (H) 223 (H) 275 (H) 73   Diabetes history: DM 2 Outpatient Diabetes medications: Humulin 70/30 10 units bid, Metformin 500 mg bid Current orders for Inpatient glycemic control:  Novolog 8 units tid Levemir 15 units bid Novolog 0-20 units tid + hs   Inpatient Diabetes Program Recommendations:    Note Novolog meal coverage increased to 8 units tid  -  Consider increasing Semglee to 18 units -  Decrease correction scale to Novolog to 0-15 units tid   Thanks,  Tama Headings RN, MSN, BC-ADM Inpatient Diabetes Coordinator Team Pager [phone] (8a-5p)

## 2022-05-10 NOTE — Progress Notes (Signed)
Peripherally Inserted Central Catheter Placement  The IV Nurse has discussed with the patient and/or persons authorized to consent for the patient, the purpose of this procedure and the potential benefits and risks involved with this procedure.  The benefits include less needle sticks, lab draws from the catheter, and the patient may be discharged home with the catheter. Risks include, but not limited to, infection, bleeding, blood clot (thrombus formation), and puncture of an artery; nerve damage and irregular heartbeat and possibility to perform a PICC exchange if needed/ordered by physician.  Alternatives to this procedure were also discussed.  Bard Power PICC patient education guide, fact sheet on infection prevention and patient information card has been provided to patient /or left at bedside.    PICC Placement Documentation  PICC Single Lumen 05/10/22 Right Brachial 41 cm 0 cm (Active)  Indication for Insertion or Continuance of Line Home intravenous therapies (PICC only) 05/10/22 1436  Exposed Catheter (cm) 0 cm 05/10/22 1436  Site Assessment Clean, Dry, Intact 05/10/22 1436  Line Status Flushed;Saline locked;Blood return noted 05/10/22 1436  Dressing Type Transparent;Securing device 05/10/22 1436  Dressing Status Antimicrobial disc in place 05/10/22 1436  Safety Lock Not Applicable 05/10/22 1436  Dressing Change Due 05/17/22 05/10/22 1436       Romie Jumper 05/10/2022, 2:43 PM

## 2022-05-10 NOTE — Progress Notes (Signed)
PHARMACY CONSULT NOTE FOR:  OUTPATIENT  PARENTERAL ANTIBIOTIC THERAPY (OPAT)  Indication: L-DFI/osteo Regimen: Cefazolin 2g IV every 8 hours and Flagyl 500 mg po twice daily End date: 06/15/22  IV antibiotic discharge orders are pended. To discharging provider:  please sign these orders via discharge navigator,  Select New Orders & click on the button choice - Manage This Unsigned Work.     Thank you for allowing pharmacy to be a part of this patient's care.  Georgina Pillion, PharmD, BCPS Infectious Diseases Clinical Pharmacist 05/10/2022 6:55 AM   **Pharmacist phone directory can now be found on [email or web address] ([email or web address]).  Listed under [facility].

## 2022-05-11 ENCOUNTER — Telehealth: Payer: Self-pay | Admitting: Orthopedic Surgery

## 2022-05-11 ENCOUNTER — Other Ambulatory Visit (HOSPITAL_COMMUNITY): Payer: Self-pay

## 2022-05-11 ENCOUNTER — Telehealth: Payer: Self-pay

## 2022-05-11 DIAGNOSIS — E119 Type 2 diabetes mellitus without complications: Secondary | ICD-10-CM

## 2022-05-11 DIAGNOSIS — E669 Obesity, unspecified: Secondary | ICD-10-CM

## 2022-05-11 LAB — CBC WITH DIFFERENTIAL/PLATELET
Abs Immature Granulocytes: 0.33 10*3/uL — ABNORMAL HIGH (ref 0.00–0.07)
Basophils Absolute: 0 10*3/uL (ref 0.0–0.1)
Basophils Relative: 0 %
Eosinophils Absolute: 0.2 10*3/uL (ref 0.0–0.5)
Eosinophils Relative: 2 %
HCT: 32.1 % — ABNORMAL LOW (ref 39.0–52.0)
Hemoglobin: 10.2 g/dL — ABNORMAL LOW (ref 13.0–17.0)
Immature Granulocytes: 4 %
Lymphocytes Relative: 23 %
Lymphs Abs: 2.1 10*3/uL (ref 0.7–4.0)
MCH: 26.7 pg (ref 26.0–34.0)
MCHC: 31.8 g/dL (ref 30.0–36.0)
MCV: 84 fL (ref 80.0–100.0)
Monocytes Absolute: 0.7 10*3/uL (ref 0.1–1.0)
Monocytes Relative: 8 %
Neutro Abs: 5.8 10*3/uL (ref 1.7–7.7)
Neutrophils Relative %: 63 %
Platelets: 412 10*3/uL — ABNORMAL HIGH (ref 150–400)
RBC: 3.82 MIL/uL — ABNORMAL LOW (ref 4.22–5.81)
RDW: 14.3 % (ref 11.5–15.5)
WBC: 9.2 10*3/uL (ref 4.0–10.5)
nRBC: 0 % (ref 0.0–0.2)

## 2022-05-11 LAB — GLUCOSE, CAPILLARY
Glucose-Capillary: 295 mg/dL — ABNORMAL HIGH (ref 70–99)
Glucose-Capillary: 179 mg/dL — ABNORMAL HIGH (ref 70–99)
Glucose-Capillary: 132 mg/dL — ABNORMAL HIGH (ref 70–99)

## 2022-05-11 LAB — BASIC METABOLIC PANEL
Anion gap: 7 (ref 5–15)
BUN: 12 mg/dL (ref 6–20)
CO2: 30 mmol/L (ref 22–32)
Calcium: 8.6 mg/dL — ABNORMAL LOW (ref 8.9–10.3)
Chloride: 98 mmol/L (ref 98–111)
Creatinine, Ser: 1.06 mg/dL (ref 0.61–1.24)
GFR, Estimated: >60 mL/min (ref >60)
Glucose, Bld: 199 mg/dL — ABNORMAL HIGH (ref 70–99)
Potassium: 4.4 mmol/L (ref 3.5–5.1)
Sodium: 135 mmol/L (ref 135–145)

## 2022-05-11 MED ORDER — INSULIN NPH ISOPHANE & REGULAR (70-30) 100 UNIT/ML ~~LOC~~ SUSP
30.0000 [IU] | Freq: Two times a day (BID) | SUBCUTANEOUS | 1 refills | Status: DC
  Filled 2022-05-11 (×3): qty 15, 25d supply, fill #0
  Filled 2022-05-11: qty 18, 30d supply, fill #0

## 2022-05-11 MED ORDER — NICOTINE 21 MG/24HR TD PT24
21.0000 mg | MEDICATED_PATCH | Freq: Every day | TRANSDERMAL | 0 refills | Status: DC
  Filled 2022-05-11: qty 28, 28d supply, fill #0

## 2022-05-11 MED ORDER — OXYCODONE HCL 5 MG PO TABS
5.0000 mg | ORAL_TABLET | ORAL | 0 refills | Status: DC | PRN
Start: 2022-05-11 — End: 2022-05-18
  Filled 2022-05-11: qty 15, 3d supply, fill #0

## 2022-05-11 MED ORDER — LEVOFLOXACIN 750 MG PO TABS
750.0000 mg | ORAL_TABLET | Freq: Every day | ORAL | 0 refills | Status: AC
  Filled 2022-05-11: qty 30, 30d supply, fill #0

## 2022-05-11 MED ORDER — LEVOFLOXACIN 500 MG PO TABS
750.0000 mg | ORAL_TABLET | Freq: Every day | ORAL | Status: DC
  Administered 2022-05-11: 750 mg via ORAL
  Filled 2022-05-11: qty 2

## 2022-05-11 MED ORDER — METFORMIN HCL 500 MG PO TABS
500.0000 mg | ORAL_TABLET | Freq: Two times a day (BID) | ORAL | 1 refills | Status: DC
  Filled 2022-05-11: qty 60, 30d supply, fill #0

## 2022-05-11 MED ORDER — INSULIN PEN NEEDLE 32G X 4 MM MISC
0 refills | Status: DC
  Filled 2022-05-11: qty 100, 25d supply, fill #0

## 2022-05-11 NOTE — Telephone Encounter (Signed)
Pt is sch for d/c from the hospital this afternoon. Will call to set up first post op appt next week. S/p left foot debridement and will b able to give note at that time with estimated time out of work.

## 2022-05-11 NOTE — Discharge Summary (Addendum)
Physician Discharge Summary  [name] FBP:[id number] DOB: 02/28/1966 DOA: 05/04/2022  PCP: Patient, No Pcp Per (Inactive)  Admit date: 05/04/2022 Discharge date: 05/11/2022  Time spent: 60 minutes  Recommendations for Outpatient Follow-up:  Follow-up with Dr. Margarita Rana, on 06/05/2022 at 9:30 AM to establish care.  On follow-up patient will need a basic metabolic profile done to follow-up on electrolytes and renal function.  Patient's diabetes will need to be reassessed on follow-up. Follow-up with Dr. Sharol Given 1 week. Follow-up with Dr. Candiss Norse, infectious disease on 05/23/2022 at 9:30 AM. Follow-up at the [facility] on 05/18/2022 with Dr. Fredirick Maudlin at 1:15 PM.   Discharge Diagnoses:  Principal Problem:   Diabetic foot ulcer ([place]) Active Problems:   AKI (acute kidney injury) ([place])   Type 2 diabetes mellitus ([place])   Obesity (BMI 30-39.9)   Tobacco abuse   Hyperkalemia   Cellulitis and abscess of foot   Discharge Condition: Stable  Diet recommendation: Carb modified diet  Filed Weights   05/04/22 1332 05/04/22 2132 05/05/22 0950  Weight: 106.6 kg 100.9 kg 100.9 kg    History of present illness:  HPI per Dr. Jake Church [name] is a 56 y.o. male with medical history significant of left foot diabetic ulcer, IDDM, came with left foot pain and abscess.   Patient had a chronic left-sided diabetic foot ulcer, initially infected in January, MRI at that point showed only soft tissue infection and patient received treatment of p.o. antibiotics.  Symptoms has been stable, no pain or enlargement of the wound until about 1 week ago, patient ran out of his insulins and started to have left-sided foot pain.  The pain was 2-3/10, but last 2 days has become unbearable.  Last night he had to take a total of 200 mg x 8 of ibuprofen and 4 Tylenol to control the pain.  Denies any fever.  For last 2 days, he also noticed thick pus coming out of the wound associated with full  smell.  No fever or chills.   ED Course: Patient was found tachycardia blood pressure borderline but afebrile.  Elevated WBC count, creatinine 1.6 compared to baseline 1.1-1.3.   X-ray compatible with left lateral forefoot soft tissue ulceration suspicious for foreign body with extensive subcu cutaneous emphysema.   Vancomycin and cefepime started in the ED. Hospital Course:   Assessment and Plan: * Diabetic foot ulcer ([place]) MRI L foot with nonspecific low grade edema and enhancement along plantar side of head of 5th metatarsal - arthropathy vs early osteo Extensive gas in plantar soft tissues - extension to involve the plantar portion of flexor digitorum brevis muscle indicative of myositis and gas producing soft tissue infection Blood cultures with no growth to date x5 days. Aerobic/anaerobic cx MRSA, strep gallolyticus, klebsiella oxytoca,  Operative cultures -> klebsiella oxytoca, strep gallolyticus, strep gordonii, MRSA CRP 26.4, sed rate 107 Leukocytosis improved Now s/p left foot debridement by Dr. Sharol Given, pt had extensive necrotic skin and soft tissue and muscle.  No soft tissue coverage of the meta tarsal heads.  Patient refused amputation recommended by Dr. Sharol Given. ID c/s for abx recommendations Discussed wound care with Dr. Sharol Given (Patient can wash his foot with soap and water apply a dry dressing, minimize weightbearing.  Ideally nonweightbearing.) -Patient will be set up for the wound care center. -IV antibiotics narrowed down to cefazolin and metronidazole and Levaquin added per ID. -Vancomycin discontinued per ID as only foot swab grew MRSA. -Patient will need  6 weeks IV antibiotics which has been arranged with home health, with ID follow-up. -PICC line placed and patient will be discharged in stable and improved condition.  AKI (acute kidney injury) ([place]) Baseline creatinine 1.1 Elevated to 1.67 on presentation improved with hydration. -Saline lock IV fluids. Renal US  without hydro ->thickening of bladder wall, will follow UA ([place]) Related to NSAIDs? -NSAIDs discontinued on discharge. -Acute kidney injury had resolved by day of discharge with creatinine at 1.06. -Outpatient follow-up with PCP.  Type 2 diabetes mellitus (H[facility]) hemoglobin A1c 11.5 (05/07/2022 ) -Patient was placed on Semglee and subsequently changed to home regimen 70/30 and dose increased to 30 units twice daily.   -No coverage NovoLog was discontinued.   -Outpatient follow-up with PCP.   Obesity (BMI 30-39.9) noted  Tobacco abuse Tobacco cessation stressed to patient.   -Patient maintained on nicotine patch, gum.   -Does not seem ready to quit.   -Outpatient follow-up.   Hyperkalemia resolved        Procedures: Plain films left foot 05/04/2022 MRI left foot 05/05/2022 Renal ultrasound 05/06/2022 Left foot debridement per orthopedics: Dr. Sharol Given 05/05/2022-noted to have extensive necrotic soft tissue and muscle, tissue sent for cultures, did not have foot salvage options. PICC line placement 05/10/2022  Consultations: Orthopedics: Dr. Sharol Given 05/05/2022 Infectious disease: Dr. Candiss Norse 05/09/2022  Discharge Exam: Vitals:   05/11/22 0801 05/11/22 1620  BP: (!) 146/82 117/88  Pulse: 85 83  Resp: 18 18  Temp: 98.7 F (37.1 C) 97.9 F (36.6 C)  SpO2: 97% 100%    General: NAD Cardiovascular: RRR no murmurs rubs or gallops.  No JVD.  No lower extremity edema Respiratory: Clear to auscultation bilaterally.  No wheezes, no crackles, no rhonchi.  Fair air movement.  Speaking in full sentences.  Discharge Instructions   Discharge Instructions     [facility] pharmacist to adjust dose for Vancomycin, Aminoglycosides and other anti-infective therapies as requested by physician.   Complete by: As directed    [facility] to provide Cath Flo 28m   Complete by: As directed    Administer for PICC line occlusion and as ordered by physician for other access device  issues.   Anaphylaxis Kit: Provided to treat any anaphylactic reaction to the medication being provided to the patient if First Dose or when requested by physician   Complete by: As directed    Epinephrine 161mml vial / amp: Administer 0.33m40m0.33ml15mubcutaneously once for moderate to severe anaphylaxis, nurse to call physician and pharmacy when reaction occurs and call 911 if needed for immediate care   Diphenhydramine 50mg533mIV vial: Administer 25-50mg 67mM PRN for first dose reaction, rash, itching, mild reaction, nurse to call physician and pharmacy when reaction occurs   Sodium Chloride 0.9% NS 500ml I58mdminister if needed for hypovolemic blood pressure drop or as ordered by physician after call to physician with anaphylactic reaction   Change dressing on IV access line weekly and PRN   Complete by: As directed    Diet Carb Modified   Complete by: As directed    Discharge wound care:   Complete by: As directed    Wound care  Every shift      Comments: Patient can wash his foot with soap and water apply a dry dressing dail.  05/09/22 1653     05/05/22 1328    Reinforce dressing  Until discontinued   Flush IV access with Sodium Chloride 0.9% and Heparin 10 units/ml or 100  units/ml   Complete by: As directed    Home infusion instructions - [facility]   Complete by: As directed    Instructions: Flush IV access with Sodium Chloride 0.9% and Heparin 10units/ml or 100units/ml   Change dressing on IV access line: Weekly and PRN   Instructions Cath Flo 46m: Administer for PICC Line occlusion and as ordered by physician for other access device   [facility] pharmacist to adjust dose for: Vancomycin, Aminoglycosides and other anti-infective therapies as requested by physician   Increase activity slowly   Complete by: As directed    Method of administration may be changed at the discretion of home infusion pharmacist based upon assessment of the patient and/or  caregiver's ability to self-administer the medication ordered   Complete by: As directed       Allergies as of 05/11/2022   No Known Allergies      Medication List     STOP taking these medications    famotidine 20 MG tablet Commonly known as: PEPCID   HumuLIN 70/30 KwikPen (70-30) 100 UNIT/ML KwikPen Generic drug: insulin isophane & regular human KwikPen Replaced by: insulin NPH-regular Human (70-30) 100 UNIT/ML injection   ibuprofen 200 MG tablet Commonly known as: ADVIL       TAKE these medications    acetaminophen 500 MG tablet Commonly known as: TYLENOL Take 2,000 mg by mouth every 6 (six) hours as needed for mild pain or moderate pain.   ceFAZolin  IVPB Commonly known as: ANCEF Inject 2 g into the vein every 8 (eight) hours. Indication:  L-DFI/osteo First Dose: Yes Last Day of Therapy:  06/16/22 Labs - Once weekly:  CBC/D and BMP, Labs - Every other week:  ESR and CRP Method of administration: IV Push Method of administration may be changed at the discretion of home infusion pharmacist based upon assessment of the patient and/or caregiver's ability to self-administer the medication ordered.   insulin NPH-regular Human (70-30) 100 UNIT/ML injection Inject 30 Units into the skin 2 (two) times daily with a meal. Replaces: HumuLIN 70/30 KwikPen (70-30) 100 UNIT/ML KwikPen   levofloxacin 750 MG tablet Commonly known as: LEVAQUIN Take 1 tablet (750 mg total) by mouth daily. Start taking on: May 12, 2022   metFORMIN 500 MG tablet Commonly known as: GLUCOPHAGE Take 1 tablet (500 mg total) by mouth 2 (two) times daily.   metroNIDAZOLE 500 MG tablet Commonly known as: Flagyl Take 1 tablet (500 mg total) by mouth 2 (two) times daily. Stop date 06/16/22 when IV antibiotics are completed   nicotine 21 mg/24hr patch Commonly known as: NICODERM CQ - dosed in mg/24 hours Place 1 patch (21 mg total) onto the skin daily. Start taking on: May 12, 2022   oxyCODONE 5 MG  immediate release tablet Commonly known as: Oxy IR/ROXICODONE Take 1 tablet (5 mg total) by mouth every 4 (four) hours as needed for moderate pain (pain score 4-6).   Pentips 32G X 4 MM Misc Generic drug: Insulin Pen Needle Use as directed to inject insulin up to 4 times daily. What changed: Another medication with the same name was added. Make sure you understand how and when to take each.   Insulin Pen Needle 32G X 4 MM Misc Use to inject insulin up to 4 times daily as directed. What changed: You were already taking a medication with the same name, and this prescription was added. Make sure you understand how and when to take each.  Discharge Care Instructions  (From admission, onward)           Start     Ordered   05/11/22 0000  Discharge wound care:       Comments: Wound care  Every shift      Comments: Patient can wash his foot with soap and water apply a dry dressing dail.  05/09/22 1653     05/05/22 1328    Reinforce dressing  Until discontinued   05/11/22 1441   05/10/22 0000  Change dressing on IV access line weekly and PRN  (Home infusion instructions - [facility] )        05/10/22 0908           No Known Allergies  Follow-up Information     Charlott Rakes, MD Follow up.   Specialty: Family Medicine Why: June 05, 2022 at 0930 am Contact information: [place] [address] [phone]         Newt Minion, MD Follow up in 1 week(s).   Specialty: Orthopedic Surgery Contact information: [place] [address] [phone]         [facility]             . Go on 05/18/2022.   Why: Appointment scheduled 6/15 at 1:15pm with Fredirick Maudlin MD Contact information: [place] [phone] [place]. Go to.   Why: July 3 , 2023 at 0930  am Contact information: [place] [phone] [phone]        [facility] Follow up.   Why: infusion company [place]        Laurice Record, MD Follow up on 05/23/2022.   Specialty: Infectious Diseases Why: Follow-up at 9:30 AM. Contact information: [address] [place] [address] [phone]                  The results of significant diagnostics from this hospitalization (including imaging, microbiology, ancillary and laboratory) are listed below for reference.    Significant Diagnostic Studies: [place] EKG SITE RITE  Result Date: 05/10/2022 If Site Rite image not attached, placement could not be confirmed due to current cardiac rhythm.  US RENAL  Result Date: 05/06/2022 CLINICAL DATA:  Diabetes mellitus EXAM: RENAL / URINARY TRACT ULTRASOUND COMPLETE COMPARISON:  None Available. FINDINGS: Right Kidney: Renal measurements: 11.4 x 5.8 x 6.4 cm = volume: 221.34 mL. Echogenicity within normal limits. No mass or hydronephrosis visualized. Left Kidney: Renal measurements: 11.6 x 5.6 x 6.1 cm = volume: 206.31 mL. Echogenicity within normal limits. No mass or hydronephrosis visualized. Bladder: Nonspecific slight thickening of the bladder wall, possibly due to under-distension. Other: None. IMPRESSION: Normal sonographic appearance of the kidneys.  No hydronephrosis. Nonspecific slight thickening of the bladder wall, possibly due to under-distension. Could correlate with urinalysis. Electronically Signed   By: Maurine Simmering M.D.   On: 05/06/2022 11:50   MR FOOT LEFT W WO CONTRAST  Result Date: 05/05/2022 CLINICAL DATA:  Diabetic foot swelling.  Ulcer. EXAM: MRI OF THE LEFT FOOT WITHOUT AND WITH CONTRAST TECHNIQUE: Multiplanar, multisequence MR imaging of the left foot was performed both before and after administration of intravenous contrast. Osteomyelitis protocol MRI of the foot was obtained, to include the entire  foot and ankle. This protocol  uses a large field of view to cover the entire foot and ankle, and is suitable for assessing bony structures for osteomyelitis. Due to the large field of view and imaging plane choice, this protocol is less sensitive for assessing small structures such as ligamentous structures of the foot and ankle, compared to a dedicated forefoot or dedicated hindfoot exam. CONTRAST:  52m GADAVIST GADOBUTROL 1 MMOL/ML IV SOLN COMPARISON:  12/14/2021 and radiographs 05/04/2022 FINDINGS: Osteomyelitis protocol MRI of the foot was obtained, to include the entire foot and ankle. This protocol uses a large field of view to cover the entire foot and ankle, and is suitable for assessing bony structures for osteomyelitis. Due to the large field of view and imaging plane choice, this protocol is less sensitive for assessing small structures such as ligamentous structures of the foot and ankle, compared to a dedicated forefoot or dedicated hindfoot exam. Bones/Joint/Cartilage Subtle accentuated edema and enhancement along the plantar side of the fifth metatarsal, nonspecific for arthropathy versus early osteomyelitis. Ligaments Lisfranc ligament intact. Muscles and Tendons Diffuse low-level edema in the regional musculature, possibly neurogenic. Abnormal gas tracks along the plantar margin of the flexor digitorum brevis muscle as shown on image 45 series 16, compatible with infection and myositis. Soft tissues Extensive subcutaneous gas along the plantar foot especially medially as on image 34 series 16. This tracks from about the level of the Chopart joint to the base of the first through fourth toes. Small amount of artifact associated with the ulceration along the plantar base of the fifth metatarsal with localized edema and infiltration of tissue in this vicinity. The most part the gas appears multilocular rather than confluent in a single cavity, and no well-defined drainable abscess is observed. Dorsal  subcutaneous edema and enhancement noted in the foot compatible with cellulitis. IMPRESSION: 1. Nonspecific low-grade edema and enhancement along the plantar side of the head of the fifth metatarsal. This is nonspecific for arthropathy versus early osteomyelitis although the proximity to ulceration and localized subcutaneous infiltration tends to favor early osteomyelitis. 2. Extensive gas in the plantar soft tissues, primarily in the subcutaneous tissues extending from about the level of the Chopart joint through the ball of the foot, but also with some extension to involve the plantar portion of the flexor digitorum brevis muscle, indicative of myositis and gas producing soft tissue infection. Electronically Signed   By: WVan ClinesM.D.   On: 05/05/2022 07:47   DG Foot Complete Left  Result Date: 05/04/2022 CLINICAL DATA:  Pain in lateral foot EXAM: LEFT FOOT - COMPLETE 3+ VIEW COMPARISON:  12/14/2021 FINDINGS: Ulceration noted in the lateral forefoot. There are 2 small hyperdensities in the region of ulceration which may be foreign body or topical medication. Extensive subcutaneous emphysema of the palmar 4 and midfoot. Enthesopathic changes are seen at the insertion of the plantar fascia and Achilles tendon on the calcaneus. IMPRESSION: Lateral forefoot soft tissue ulceration containing small hyperdensities which may be foreign body or medication. Please correlate with examination. Extensive subcutaneous emphysema of the plantar mid and forefoot. No osseous erosions are seen to indicate osteomyelitis. Electronically Signed   By: FMiachel RouxM.D.   On: 05/04/2022 14:09    Microbiology: Recent Results (from the past 240 hour(s))  Blood culture (routine x 2)     Status: None   Collection Time: 05/04/22  2:44 PM   Specimen: BLOOD  Result Value Ref Range Status   Specimen Description BLOOD SITE NOT SPECIFIED  Final   Special  Requests   Final    BOTTLES DRAWN AEROBIC AND ANAEROBIC Blood Culture  adequate volume   Culture   Final    NO GROWTH 5 DAYS Performed at [facility], [place] [address], [place], [place] [address]    Report Status 05/09/2022 FINAL  Final  Blood culture (routine x 2)     Status: None   Collection Time: 05/04/22  2:55 PM   Specimen: BLOOD LEFT ARM  Result Value Ref Range Status   Specimen Description BLOOD LEFT ARM  Final   Special Requests   Final    BOTTLES DRAWN AEROBIC AND ANAEROBIC Blood Culture adequate volume   Culture   Final    NO GROWTH 5 DAYS Performed at [facility], [place] [address] [place], [place] [address]    Report Status 05/09/2022 FINAL  Final  Aerobic/Anaerobic Culture w Gram Stain (surgical/deep wound)     Status: None   Collection Time: 05/04/22  3:25 PM   Specimen: Foot  Result Value Ref Range Status   Specimen Description FOOT  Final   Special Requests NONE  Final   Gram Stain   Final    RARE WBC PRESENT, PREDOMINANTLY PMN FEW GRAM POSITIVE COCCI IN PAIRS RARE GRAM NEGATIVE RODS    Culture   Final    MODERATE STREPTOCOCCUS GALLOLYTICUS RARE METHICILLIN RESISTANT STAPHYLOCOCCUS AUREUS RARE KLEBSIELLA OXYTOCA FEW PREVOTELLA SPECIES BETA LACTAMASE POSITIVE Performed at [facility], [place] [address], [place], [place] [address]    Report Status 05/08/2022 FINAL  Final   Organism ID, Bacteria STREPTOCOCCUS GALLOLYTICUS  Final   Organism ID, Bacteria METHICILLIN RESISTANT STAPHYLOCOCCUS AUREUS  Final   Organism ID, Bacteria KLEBSIELLA OXYTOCA  Final      Susceptibility   Klebsiella oxytoca - MIC*    AMPICILLIN >=32 RESISTANT Resistant     CEFAZOLIN <=4 SENSITIVE Sensitive     CEFEPIME <=0.12 SENSITIVE Sensitive     CEFTAZIDIME <=1 SENSITIVE Sensitive     CEFTRIAXONE <=0.25 SENSITIVE Sensitive     CIPROFLOXACIN <=0.25 SENSITIVE Sensitive     GENTAMICIN <=1 SENSITIVE Sensitive     IMIPENEM <=0.25 SENSITIVE Sensitive     TRIMETH/SULFA >=320 RESISTANT Resistant     AMPICILLIN/SULBACTAM 16 INTERMEDIATE  Intermediate     PIP/TAZO <=4 SENSITIVE Sensitive     * RARE KLEBSIELLA OXYTOCA   Methicillin resistant staphylococcus aureus - MIC*    CIPROFLOXACIN >=8 RESISTANT Resistant     ERYTHROMYCIN >=8 RESISTANT Resistant     GENTAMICIN <=0.5 SENSITIVE Sensitive     OXACILLIN >=4 RESISTANT Resistant     TETRACYCLINE <=1 SENSITIVE Sensitive     VANCOMYCIN 1 SENSITIVE Sensitive     TRIMETH/SULFA >=320 RESISTANT Resistant     CLINDAMYCIN <=0.25 SENSITIVE Sensitive     RIFAMPIN <=0.5 SENSITIVE Sensitive     Inducible Clindamycin NEGATIVE Sensitive     * RARE METHICILLIN RESISTANT STAPHYLOCOCCUS AUREUS   Streptococcus gallolyticus - MIC*    PENICILLIN 0.12 SENSITIVE Sensitive     * MODERATE STREPTOCOCCUS GALLOLYTICUS  Surgical PCR screen     Status: Abnormal   Collection Time: 05/05/22 12:58 AM   Specimen: Nasal Mucosa; Nasal Swab  Result Value Ref Range Status   MRSA, PCR POSITIVE (A) NEGATIVE Final    Comment: RESULT CALLED TO, READ BACK BY AND VERIFIED WITH: RN LANGSTON O. 05/05/22@2 :26 BY TW    Staphylococcus aureus POSITIVE (A) NEGATIVE Final    Comment: (NOTE) The Xpert SA Assay (FDA approved for  NASAL specimens in patients 51 years of age and older), is one component of a comprehensive surveillance program. It is not intended to diagnose infection nor to guide or monitor treatment. Performed at [facility], [place] [address] [place], [place] [address]   Aerobic/Anaerobic Culture w Gram Stain (surgical/deep wound)     Status: None   Collection Time: 05/05/22 12:01 PM   Specimen: Soft Tissue, Other  Result Value Ref Range Status   Specimen Description TISSUE  Final   Special Requests TISSUE BOTTOM LEFT FOOT  Final   Gram Stain   Final    NO WBC SEEN ABUNDANT GRAM POSITIVE COCCI ABUNDANT GRAM NEGATIVE RODS RARE GRAM POSITIVE RODS    Culture   Final    RARE KLEBSIELLA OXYTOCA MODERATE STREPTOCOCCUS GALLOLYTICUS FEW STAPHYLOCOCCUS AUREUS FEW PREVOTELLA SPECIES BETA  LACTAMASE POSITIVE Performed at [facility], [place] [address] [place], [place] [address]    Report Status 05/08/2022 FINAL  Final   Organism ID, Bacteria KLEBSIELLA OXYTOCA  Final   Organism ID, Bacteria STREPTOCOCCUS GALLOLYTICUS  Final   Organism ID, Bacteria STAPHYLOCOCCUS AUREUS  Final      Susceptibility   Klebsiella oxytoca - MIC*    AMPICILLIN >=32 RESISTANT Resistant     CEFAZOLIN <=4 SENSITIVE Sensitive     CEFEPIME <=0.12 SENSITIVE Sensitive     CEFTAZIDIME <=1 SENSITIVE Sensitive     CEFTRIAXONE <=0.25 SENSITIVE Sensitive     CIPROFLOXACIN <=0.25 SENSITIVE Sensitive     GENTAMICIN <=1 SENSITIVE Sensitive     IMIPENEM <=0.25 SENSITIVE Sensitive     TRIMETH/SULFA 40 SENSITIVE Sensitive     AMPICILLIN/SULBACTAM 16 INTERMEDIATE Intermediate     PIP/TAZO <=4 SENSITIVE Sensitive     * RARE KLEBSIELLA OXYTOCA   Staphylococcus aureus - MIC*    CIPROFLOXACIN <=0.5 SENSITIVE Sensitive     ERYTHROMYCIN <=0.25 SENSITIVE Sensitive     GENTAMICIN <=0.5 SENSITIVE Sensitive     OXACILLIN <=0.25 SENSITIVE Sensitive     TETRACYCLINE <=1 SENSITIVE Sensitive     VANCOMYCIN 1 SENSITIVE Sensitive     TRIMETH/SULFA <=10 SENSITIVE Sensitive     CLINDAMYCIN <=0.25 SENSITIVE Sensitive     RIFAMPIN <=0.5 SENSITIVE Sensitive     Inducible Clindamycin NEGATIVE Sensitive     * FEW STAPHYLOCOCCUS AUREUS   Streptococcus gallolyticus - MIC*    PENICILLIN 0.12 SENSITIVE Sensitive     CEFTRIAXONE 0.25 SENSITIVE Sensitive     ERYTHROMYCIN <=0.12 SENSITIVE Sensitive     LEVOFLOXACIN 2 SENSITIVE Sensitive     VANCOMYCIN 0.25 SENSITIVE Sensitive     * MODERATE STREPTOCOCCUS GALLOLYTICUS  Aerobic/Anaerobic Culture w Gram Stain (surgical/deep wound)     Status: None   Collection Time: 05/05/22 12:02 PM   Specimen: Soft Tissue, Other  Result Value Ref Range Status   Specimen Description FOOT LEFT  Final   Special Requests SWAB BOTTOM LEFT FOOT SPEC B  Final   Gram Stain   Final    RARE WBC  PRESENT,BOTH PMN AND MONONUCLEAR ABUNDANT GRAM POSITIVE COCCI ABUNDANT GRAM NEGATIVE RODS    Culture   Final    FEW STREPTOCOCCUS GALLOLYTICUS FEW STAPHYLOCOCCUS AUREUS FEW STREPTOCOCCUS GORDONII FEW PREVOTELLA SPECIES BETA LACTAMASE POSITIVE Performed at [facility], [address] [place], [place] [address]    Report Status 05/10/2022 FINAL  Final   Organism ID, Bacteria STAPHYLOCOCCUS AUREUS  Final   Organism ID, Bacteria STREPTOCOCCUS GORDONII  Final   Organism ID, Bacteria STREPTOCOCCUS GALLOLYTICUS  Final  Susceptibility   Streptococcus gordonii - MIC*    PENICILLIN <=0.06 SENSITIVE Sensitive     CEFTRIAXONE <=0.12 SENSITIVE Sensitive     ERYTHROMYCIN 2 RESISTANT Resistant     LEVOFLOXACIN 0.5 SENSITIVE Sensitive     VANCOMYCIN 0.5 SENSITIVE Sensitive     * FEW STREPTOCOCCUS GORDONII   Staphylococcus aureus - MIC*    CIPROFLOXACIN <=0.5 SENSITIVE Sensitive     ERYTHROMYCIN <=0.25 SENSITIVE Sensitive     GENTAMICIN <=0.5 SENSITIVE Sensitive     OXACILLIN <=0.25 SENSITIVE Sensitive     TETRACYCLINE <=1 SENSITIVE Sensitive     VANCOMYCIN 1 SENSITIVE Sensitive     TRIMETH/SULFA <=10 SENSITIVE Sensitive     CLINDAMYCIN <=0.25 SENSITIVE Sensitive     RIFAMPIN <=0.5 SENSITIVE Sensitive     Inducible Clindamycin NEGATIVE Sensitive     * FEW STAPHYLOCOCCUS AUREUS   Streptococcus gallolyticus - MIC*    PENICILLIN 2 INTERMEDIATE Intermediate     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     ERYTHROMYCIN 2 RESISTANT Resistant     LEVOFLOXACIN 0.5 SENSITIVE Sensitive     * FEW STREPTOCOCCUS GALLOLYTICUS     Labs: Basic Metabolic Panel: Recent Labs  Lab 05/06/22 0220 05/06/22 1558 05/07/22 0123 05/08/22 0350 05/09/22 0133 05/11/22 0446  NA 131* 130* 135 136 134* 135  K 5.2* 4.4 3.6 4.8 4.8 4.4  CL 99 99 105 106 105 98  CO2 24 22 23 25 25 30   GLUCOSE 441* 413* 99 421* 394* 199*  BUN 30* 32* 28* 18 12 12   CREATININE 1.43* 1.34* 1.18 1.24 1.23 1.06  CALCIUM  8.6* 8.2* 8.6* 8.2* 8.2* 8.6*  MG 2.1  --  2.3 1.9 1.9  --   PHOS 2.8  --  2.3* 3.0 2.7  --    Liver Function Tests: Recent Labs  Lab 05/06/22 0220 05/07/22 0123 05/08/22 0350 05/09/22 0133  AST 11* 11* 9* 17  ALT 9 9 7 13   ALKPHOS 81 71 61 59  BILITOT 0.2* 0.2* 0.1* 0.5  PROT 6.4* 6.7 5.8* 5.8*  ALBUMIN 2.4* 2.7* 2.1* 2.2*   No results for input(s): "LIPASE", "AMYLASE" in the last 168 hours. No results for input(s): "AMMONIA" in the last 168 hours. CBC: Recent Labs  Lab 05/06/22 0220 05/07/22 0123 05/08/22 0350 05/09/22 0133 05/11/22 0446  WBC 21.5* 22.3* 10.4 11.8* 9.2  NEUTROABS 19.7* 18.5* 6.5 7.9* 5.8  HGB 10.2* 10.4* 10.1* 9.8* 10.2*  HCT 32.3* 32.6* 31.4* 30.2* 32.1*  MCV 84.1 83.4 82.8 82.1 84.0  PLT 281 305 313 340 412*   Cardiac Enzymes: No results for input(s): "CKTOTAL", "CKMB", "CKMBINDEX", "TROPONINI" in the last 168 hours. BNP: BNP (last 3 results) No results for input(s): "BNP" in the last 8760 hours.  ProBNP (last 3 results) No results for input(s): "PROBNP" in the last 8760 hours.  CBG: Recent Labs  Lab 05/10/22 1615 05/10/22 2203 05/11/22 0758 05/11/22 1110 05/11/22 1613  GLUCAP 312* 152* 295* 179* 132*       Signed:  Irine Seal MD.  [facility] 05/11/2022, 4:45 PM

## 2022-05-11 NOTE — Telephone Encounter (Signed)
Alinda Money is calling from the [facility] for his brother to get a letter of how long he will be disabled   Please call the pt with questions

## 2022-05-11 NOTE — Progress Notes (Signed)
Inpatient Diabetes Program Recommendations  AACE/ADA: New Consensus Statement on Inpatient Glycemic Control (2015)  Target Ranges:  Prepandial:   less than 140 mg/dL      Peak postprandial:   less than 180 mg/dL (1-2 hours)      Critically ill patients:  140 - 180 mg/dL   Lab Results  Component Value Date   GLUCAP 295 (H) 05/11/2022   HGBA1C 11.5 (H) 05/07/2022    Review of Glycemic Control  Latest Reference Range & Units 05/10/22 08:03 05/10/22 11:55 05/10/22 16:15 05/10/22 22:03 05/11/22 07:58  Glucose-Capillary 70 - 99 mg/dL 188 (H) 73 416 (H) 606 (H) 295 (H)   Diabetes history: DM 2 Outpatient Diabetes medications: Humulin 70/30 10 units bid, Metformin 500 mg bid Current orders for Inpatient glycemic control:  Novolog 0-20 units tid + hs Novolog 70/30 25 units bid  Inpatient Diabetes Program Recommendations:    -  Increase 70/30 to 30 units bid.  Thanks,  Christena Deem RN, MSN, BC-ADM Inpatient Diabetes Coordinator Team Pager [phone] (8a-5p)

## 2022-05-11 NOTE — Progress Notes (Signed)
Per Dr. Grandville Silos, ok to send bedside Novolog 70/30 insulin bottles with patient.  Insulin syringes provided.  Education given on drawing insulin with the syringe.  Patient stated that he is very well versed with insulin syringes and SQ injections.  Discharge instructions given to the patient.  Left foot dressing CDI.  Reviewed dressing change.  Patient verbalized understanding.  Encouraged to call the doctor for questions.  Discharged home.

## 2022-05-11 NOTE — Telephone Encounter (Signed)
Can you please call pt. He d/c from the hospital today and will need follow up with Dr. Sharol Given on Thursday. Can you please make appt? Let me know what time and I will open schedule. Thanks!

## 2022-05-11 NOTE — TOC Progression Note (Addendum)
Transition of Care [facility]) - Progression Note    Patient Details  Name: [name] MRN: [id number] Date of Birth: 08/30/1966  [facility] CM/SW Contact  Nadene Rubins Adria Devon, RN Phone Number: 05/11/2022, 10:11 AM  Clinical Narrative:     Talked to [facility] with [employer]. Patient can discharge today after 2 pm dose. Sister will pay for ABX and brother will cover [facility]. Pam has arranged [facility], start of care may not be until Monday, patient and family aware.   Prescriptions sent to [facility] pharmacy . Patient entered into [facility] with no co pay. Nicoderm CQ not covered by [facility]. TOC Pharmacy will bring medications to room prior to discharge.   Expected Discharge Plan: Home/Self Care Barriers to Discharge: Continued Medical Work up  Expected Discharge Plan and Services Expected Discharge Plan: Home/Self Care   Discharge Planning Services: CM Consult   Living arrangements for the past 2 months: Single Family Home                 DME Arranged: N/A         HH Arranged: NA           Social Determinants of Health (SDOH) Interventions    Readmission Risk Interventions     No data to display

## 2022-05-11 NOTE — Progress Notes (Signed)
BRIEF ID Note   Assessment: 56 year old with diabetic foot ulcer admitted with diabetic foot wound. Afebrile overnight. Continues to decline surgical intervention.     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  -Taken to OR on 6/2(Ortho, Dr. Jeanett Schlein) for left foot debridement and found to have extensive necrotic soft tissue and muscle, tissue sent for Cx. Noted that he did not have foot salvage options and will require BKA. -Pt declined BKA and ID engaged.  Recommendations:  -Continue cefazolin, metronidazole -ADD Levaquin 765m PO qd for 1/2 species of strep gallolyticus CTX resistant and PEN intermediate -6 weeks of IV therapy and ID follow-up.  -ID will sign off please call with any new concerns.    OPAT ORDERS:   Diagnosis: 5th metatarsal osteomyelitis   Culture Result: Klebsella oxytoca, MSSA,strep gordonii, strep gallolyticus,Prevotella species beta-lactamase positive   No Known Allergies     Discharge antibiotics to be given via PICC line:   Per pharmacy protocol cefazolin 2gm q8h and  metronidazole 5064m PO bid + Levaquin 75089mO qd       Duration: 6 weeks End Date: 06/15/22   [facility] with [employer] Use: Home health RN for IV administration and teaching, line care and labs.     Labs weekly while on IV antibiotics: __ CBC with differential __ CMP __ CRP __ ESR     __ Please pull PIC at completion of IV antibiotics     Fax weekly labs to ([phone] Follow Up Appt: 05/23/22   @ [facility] with Dr. SinCandiss Norse

## 2022-05-11 NOTE — Plan of Care (Signed)
  Problem: Education: Goal: Ability to describe self-care measures that may prevent or decrease complications (Diabetes Survival Skills Education) will improve Outcome: Progressing Goal: Individualized Educational Video(s) Outcome: Progressing   Problem: Coping: Goal: Ability to adjust to condition or change in health will improve Outcome: Progressing   Problem: Fluid Volume: Goal: Ability to maintain a balanced intake and output will improve Outcome: Progressing   Problem: Health Behavior/Discharge Planning: Goal: Ability to identify and utilize available resources and services will improve Outcome: Progressing Goal: Ability to manage health-related needs will improve Outcome: Progressing   Problem: Nutritional: Goal: Maintenance of adequate nutrition will improve Outcome: Progressing Goal: Progress toward achieving an optimal weight will improve Outcome: Progressing   Problem: Education: Goal: Knowledge of General Education information will improve Description: Including pain rating scale, medication(s)/side effects and non-pharmacologic comfort measures Outcome: Progressing   Problem: Health Behavior/Discharge Planning: Goal: Ability to manage health-related needs will improve Outcome: Progressing   Problem: Clinical Measurements: Goal: Ability to maintain clinical measurements within normal limits will improve Outcome: Progressing Goal: Will remain free from infection Outcome: Progressing Goal: Diagnostic test results will improve Outcome: Progressing Goal: Respiratory complications will improve Outcome: Progressing Goal: Cardiovascular complication will be avoided Outcome: Progressing   Problem: Activity: Goal: Risk for activity intolerance will decrease Outcome: Progressing   Problem: Elimination: Goal: Will not experience complications related to bowel motility Outcome: Progressing Goal: Will not experience complications related to urinary  retention Outcome: Progressing

## 2022-05-12 NOTE — Telephone Encounter (Signed)
Called pt.Went straight to voicemail and  Had to LVM#1

## 2022-05-12 NOTE — Telephone Encounter (Signed)
Can you make an appt for this pt to follow up with Dr. Lajoyce Corners Thursday? I will open a spot if you let me know a time. He d/c from the hospital and needs follow up from surgery.

## 2022-05-13 ENCOUNTER — Other Ambulatory Visit (HOSPITAL_BASED_OUTPATIENT_CLINIC_OR_DEPARTMENT_OTHER): Payer: Self-pay | Admitting: Orthopaedic Surgery

## 2022-05-13 MED ORDER — SALINE FLUSH 0.9 % IV SOLN: 10.0000 mL | Freq: Three times a day (TID) | INTRAVENOUS | 0 refills | Status: DC

## 2022-05-13 MED ORDER — SALINE FLUSH 0.9 % IV SOLN
10.0000 mL | Freq: Three times a day (TID) | INTRAVENOUS | 0 refills | Status: DC
Start: 2022-05-13 — End: 2022-07-20

## 2022-05-15 ENCOUNTER — Telehealth: Payer: Self-pay | Admitting: Orthopedic Surgery

## 2022-05-15 NOTE — Telephone Encounter (Signed)
Patient called asked if he can get a letter stating he can not return to work 6 months to 1 year? Patient said he can not put any weight down on his foot. 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 The number to contact patient is [phone]

## 2022-05-15 NOTE — Telephone Encounter (Signed)
I called and sw pt and he advised that he was wanting temporary disability. Advised that he would have to get the paperwork for this but that we could generate a note that would advise how long he would be out of work for his foot debridement and that this would also depend on healing. Advised pt he can pick this up at his first post op appt on Thursday.

## 2022-05-18 ENCOUNTER — Ambulatory Visit (INDEPENDENT_AMBULATORY_CARE_PROVIDER_SITE_OTHER): Payer: Self-pay | Admitting: Orthopedic Surgery

## 2022-05-18 ENCOUNTER — Encounter (HOSPITAL_BASED_OUTPATIENT_CLINIC_OR_DEPARTMENT_OTHER): Payer: Self-pay | Attending: General Surgery | Admitting: General Surgery

## 2022-05-18 DIAGNOSIS — E11621 Type 2 diabetes mellitus with foot ulcer: Secondary | ICD-10-CM

## 2022-05-18 DIAGNOSIS — E1165 Type 2 diabetes mellitus with hyperglycemia: Secondary | ICD-10-CM | POA: Insufficient documentation

## 2022-05-18 DIAGNOSIS — L97424 Non-pressure chronic ulcer of left heel and midfoot with necrosis of bone: Secondary | ICD-10-CM

## 2022-05-18 DIAGNOSIS — M86179 Other acute osteomyelitis, unspecified ankle and foot: Secondary | ICD-10-CM | POA: Insufficient documentation

## 2022-05-18 DIAGNOSIS — L97524 Non-pressure chronic ulcer of other part of left foot with necrosis of bone: Secondary | ICD-10-CM | POA: Insufficient documentation

## 2022-05-18 DIAGNOSIS — F17200 Nicotine dependence, unspecified, uncomplicated: Secondary | ICD-10-CM | POA: Insufficient documentation

## 2022-05-18 MED ORDER — OXYCODONE HCL 5 MG PO TABS: 5.0000 mg | ORAL_TABLET | ORAL | 0 refills | Status: DC | PRN

## 2022-05-18 MED ORDER — TEMAZEPAM 7.5 MG PO CAPS: 7.5000 mg | ORAL_CAPSULE | Freq: Every evening | ORAL | 0 refills | Status: DC | PRN

## 2022-05-18 NOTE — Progress Notes (Signed)
[name], [name] ([id number]) Visit Report for 05/18/2022 Abuse Risk Screen Details Patient Name: Date of Service: [name], [name] 05/18/2022 1:30 PM Medical Record Number: [id number] Patient Account Number: [id number] Date of Birth/Sex: Treating RN: Jul 31, 1966 (56 y.o. Male) [name] Primary Care Zohan Shiflet: PA Zenovia Jordan, NO Other Clinician: Referring Diona Peregoy: Treating Sadia Belfiore/Extender: Sherryl Manges in Treatment: 0 Abuse Risk Screen Items Answer ABUSE RISK SCREEN: Has anyone close to you tried to hurt or harm you recentlyo No Do you feel uncomfortable with anyone in your familyo No Has anyone forced you do things that you didnt want to doo No Electronic Signature(s) Signed: 05/18/2022 5:36:06 PM By: [name] RN, BSN Entered By: [name] on 05/18/2022 14:08:12 -------------------------------------------------------------------------------- Activities of Daily Living Details Patient Name: Date of Service: [name], [name] 05/18/2022 1:30 PM Medical Record Number: [id number] Patient Account Number: [id number] Date of Birth/Sex: Treating RN: Jun 11, 1966 (56 y.o. Male) [name] Primary Care Kahliya Fraleigh: PA Zenovia Jordan, NO Other Clinician: Referring Emoree Sasaki: Treating Jamyiah Labella/Extender: Sherryl Manges in Treatment: 0 Activities of Daily Living Items Answer Activities of Daily Living (Please select one for each item) Drive Automobile Completely Able T Medications ake Completely Able Use T elephone Completely Able Care for Appearance Completely Able Use T oilet Completely Able Bath / Shower Completely Able Dress Self Completely Able Feed Self Completely Able Walk Completely Able Get In / Out Bed Completely Able Housework Completely Able Prepare Meals Completely Able Handle Money Completely Able Shop for Self Completely Able Electronic Signature(s) Signed: 05/18/2022 5:36:06 PM By: [name] RN, BSN Entered By: [name] on 05/18/2022  14:08:30 -------------------------------------------------------------------------------- Education Screening Details Patient Name: Date of Service: [name], [name] 05/18/2022 1:30 PM Medical Record Number: [id number] Patient Account Number: [id number] Date of Birth/Sex: Treating RN: 1966/10/30 (56 y.o. Male) [name] Primary Care Marquisa Salih: PA Zenovia Jordan, NO Other Clinician: Referring Dickson Kostelnik: Treating Arlesia Kiel/Extender: Sherryl Manges in Treatment: 0 Primary Learner Assessed: Patient Learning Preferences/Education Level/Primary Language Learning Preference: Explanation, Demonstration, Printed Material Highest Education Level: High School Preferred Language: English Cognitive Barrier Language Barrier: No Translator Needed: No Memory Deficit: No Emotional Barrier: No Cultural/Religious Beliefs Affecting Medical Care: No Physical Barrier Impaired Vision: No Impaired Hearing: No Decreased Hand dexterity: No Knowledge/Comprehension Knowledge Level: High Comprehension Level: High Ability to understand written instructions: High Ability to understand verbal instructions: High Motivation Anxiety Level: Calm Cooperation: Cooperative Education Importance: Acknowledges Need Interest in Health Problems: Asks Questions Perception: Coherent Willingness to Engage in Self-Management High Activities: Readiness to Engage in Self-Management High Activities: Electronic Signature(s) Signed: 05/18/2022 5:36:06 PM By: [name] RN, BSN Entered By: [name] on 05/18/2022 14:08:49 -------------------------------------------------------------------------------- Fall Risk Assessment Details Patient Name: Date of Service: [name], [name] 05/18/2022 1:30 PM Medical Record Number: [id number] Patient Account Number: [id number] Date of Birth/Sex: Treating RN: 04-Mar-1966 (56 y.o. Male) [name] Primary Care Clint Strupp: PA Zenovia Jordan, NO Other Clinician: Referring Amada Hallisey: Treating  Joannie Medine/Extender: Sherryl Manges in Treatment: 0 Fall Risk Assessment Items Have you had 2 or more falls in the last 12 monthso 0 No Have you had any fall that resulted in injury in the last 12 monthso 0 No FALLS RISK SCREEN History of falling - immediate or within 3 months 0 No Secondary diagnosis (Do you have 2 or more medical diagnoseso) 0 No Ambulatory aid None/bed rest/wheelchair/nurse 0 No Crutches/cane/walker 15 Yes Furniture 0 No Intravenous therapy Access/Saline/Heparin Lock 0 No Gait/Transferring Normal/ bed rest/ wheelchair 0 Yes Weak (short steps with or without shuffle, stooped but able to lift head  while walking, may seek 0 No support from furniture) Impaired (short steps with shuffle, may have difficulty arising from chair, head down, impaired 0 No balance) Mental Status Oriented to own ability 0 Yes Electronic Signature(s) Signed: 05/18/2022 5:36:06 PM By: [name] RN, BSN Entered By: [name] on 05/18/2022 14:09:16 -------------------------------------------------------------------------------- Foot Assessment Details Patient Name: Date of Service: [name], [name] 05/18/2022 1:30 PM Medical Record Number: [id number] Patient Account Number: [id number] Date of Birth/Sex: Treating RN: 06/14/1966 (56 y.o. Male) [name] Primary Care Addie Alonge: PA Zenovia Jordan, NO Other Clinician: Referring Madasyn Heath: Treating Shreeya Recendiz/Extender: Sherryl Manges in Treatment: 0 Foot Assessment Items Site Locations + = Sensation present, - = Sensation absent, C = Callus, U = Ulcer R = Redness, W = Warmth, M = Maceration, PU = Pre-ulcerative lesion F = Fissure, S = Swelling, D = Dryness Assessment Right: Left: Other Deformity: No No Prior Foot Ulcer: No No Prior Amputation: No No Charcot Joint: No No Ambulatory Status: Ambulatory With Help Assistance Device: Crutches Gait: Steady Electronic Signature(s) Signed: 05/18/2022 5:36:06 PM By: [name] RN,  BSN Entered By: [name] on 05/18/2022 14:10:10 -------------------------------------------------------------------------------- Nutrition Risk Screening Details Patient Name: Date of Service: [name], [name] 05/18/2022 1:30 PM Medical Record Number: [id number] Patient Account Number: [id number] Date of Birth/Sex: Treating RN: 19-Oct-1966 (56 y.o. Male) [name] Primary Care Chen Holzman: PA Zenovia Jordan, NO Other Clinician: Referring Diona Peregoy: Treating Cisco Kindt/Extender: Sherryl Manges in Treatment: 0 Height (in): 70 Weight (lbs): 215 Body Mass Index (BMI): 30.8 Nutrition Risk Screening Items Score Screening NUTRITION RISK SCREEN: I have an illness or condition that made me change the kind and/or amount of food I eat 2 Yes I eat fewer than two meals per day 0 No I eat few fruits and vegetables, or milk products 0 No I have three or more drinks of beer, liquor or wine almost every day 0 No I have tooth or mouth problems that make it hard for me to eat 0 No I don't always have enough money to buy the food I need 0 No I eat alone most of the time 0 No I take three or more different prescribed or over-the-counter drugs a day 1 Yes Without wanting to, I have lost or gained 10 pounds in the last six months 0 No I am not always physically able to shop, cook and/or feed myself 0 No Nutrition Protocols Good Risk Protocol Moderate Risk Protocol 0 Provide education on nutrition High Risk Proctocol Risk Level: Moderate Risk Score: 3 Electronic Signature(s) Signed: 05/18/2022 5:36:06 PM By: [name] RN, BSN Entered By: [name] on 05/18/2022 14:09:23

## 2022-05-19 NOTE — Telephone Encounter (Signed)
Pt had appt, closing issue

## 2022-05-19 NOTE — Progress Notes (Signed)
[name], [name] ([id number]) Visit Report for 05/18/2022 Allergy List Details Patient Name: Date of Service: [name], [name] 05/18/2022 1:30 PM Medical Record Number: [id number] Patient Account Number: [id number] Date of Birth/Sex: Treating RN: 1966-09-28 (56 y.o. Male) [name] Primary Care Tamana Hatfield: PA Zenovia Jordan, NO Other Clinician: Referring Alnita Aybar: Treating Babacar Haycraft/Extender: Sherryl Manges in Treatment: 0 Allergies Active Allergies No Known Drug Allergies Allergy Notes Electronic Signature(s) Signed: 05/18/2022 5:36:06 PM By: [name] RN, BSN Entered By: [name] on 05/18/2022 13:36:13 -------------------------------------------------------------------------------- Arrival Information Details Patient Name: Date of Service: [name], [name] 05/18/2022 1:30 PM Medical Record Number: [id number] Patient Account Number: [id number] Date of Birth/Sex: Treating RN: Apr 30, 1966 (56 y.o. Male) [name] Primary Care Kalid Ghan: PA Zenovia Jordan, NO Other Clinician: Referring Celester Morgan: Treating Hezekiah Veltre/Extender: Sherryl Manges in Treatment: 0 Visit Information Patient Arrived: Crutches Arrival Time: 13:38 Accompanied By: sister-in-law Transfer Assistance: None Patient Identification Verified: Yes Secondary Verification Process Completed: Yes Patient Requires Transmission-Based Precautions: No Patient Has Alerts: Yes Patient Alerts: L ABI: 1.09 TBI: 0.97 Electronic Signature(s) Signed: 05/18/2022 5:36:06 PM By: [name] RN, BSN Entered By: [name] on 05/18/2022 14:17:45 -------------------------------------------------------------------------------- Clinic Level of Care Assessment Details Patient Name: Date of Service: [name], [name] 05/18/2022 1:30 PM Medical Record Number: [id number] Patient Account Number: [id number] Date of Birth/Sex: Treating RN: 1966/08/05 (56 y.o. Male) [name] Primary Care Shamarcus Hoheisel: PA Zenovia Jordan, NO Other Clinician: Referring  Booker Bhatnagar: Treating Zell Doucette/Extender: Sherryl Manges in Treatment: 0 Clinic Level of Care Assessment Items TOOL 1 Quantity Score X- 1 0 Use when EandM and Procedure is performed on INITIAL visit ASSESSMENTS - Nursing Assessment / Reassessment X- 1 20 General Physical Exam (combine w/ comprehensive assessment (listed just below) when performed on new pt. evals) X- 1 25 Comprehensive Assessment (HX, ROS, Risk Assessments, Wounds Hx, etc.) ASSESSMENTS - Wound and Skin Assessment / Reassessment []  - 0 Dermatologic / Skin Assessment (not related to wound area) ASSESSMENTS - Ostomy and/or Continence Assessment and Care []  - 0 Incontinence Assessment and Management []  - 0 Ostomy Care Assessment and Management (repouching, etc.) PROCESS - Coordination of Care X - Simple Patient / Family Education for ongoing care 1 15 []  - 0 Complex (extensive) Patient / Family Education for ongoing care X- 1 10 Staff obtains , Records, T Results / Process Orders est []  - 0 Staff telephones HHA, Nursing Homes / Clarify orders / etc []  - 0 Routine Transfer to another Facility (non-emergent condition) []  - 0 Routine Hospital Admission (non-emergent condition) X- 1 15 New Admissions / / Ordering NPWT Apligraf, etc. , []  - 0 Emergency Hospital Admission (emergent condition) PROCESS - Special Needs []  - 0 Pediatric / Minor Patient Management []  - 0 Isolation Patient Management []  - 0 Hearing / Language / Visual special needs []  - 0 Assessment of Community assistance (transportation, D/C planning, etc.) []  - 0 Additional assistance / Altered mentation []  - 0 Support Surface(s) Assessment (bed, cushion, seat, etc.) INTERVENTIONS - Miscellaneous []  - 0 External ear exam []  - 0 Patient Transfer (multiple staff / / Similar devices) []  - 0 Simple Staple / Suture removal (25 or less) []  - 0 Complex Staple / Suture removal (26 or more) []  -  0 Hypo/Hyperglycemic Management (do not check if billed separately) []  - 0 Ankle / Brachial Index (ABI) - do not check if billed separately Has the patient been seen at the hospital within the last three years: Yes Total Score: 85 Level Of Care: New/Established -  Level 3 Electronic Signature(s) Signed: 05/18/2022 5:36:06 PM By: Levan Hurst RN, BSN Entered By: Levan Hurst on 05/18/2022 17:29:28 -------------------------------------------------------------------------------- Encounter Discharge Information Details Patient Name: Date of Service: [name], [name] 05/18/2022 1:30 PM Medical Record Number: [id number] Patient Account Number: [id number] Date of Birth/Sex: Treating RN: 13-Apr-1966 (56 y.o. Male) Levan Hurst Primary Care Kristi Norment: PA Haig Prophet, NO Other Clinician: Referring Shaketa Serafin: Treating Kristofer Schaffert/Extender: Jadene Pierini in Treatment: 0 Encounter Discharge Information Items Post Procedure Vitals Discharge Condition: Stable Temperature (F): 98.4 Ambulatory Status: Crutches Pulse (bpm): 95 Discharge Destination: Home Respiratory Rate (breaths/min): 18 Transportation: Private Auto Blood Pressure (mmHg): 111/75 Accompanied By: alone Schedule Follow-up Appointment: Yes Clinical Summary of Care: Patient Declined Electronic Signature(s) Signed: 05/18/2022 5:36:06 PM By: Levan Hurst RN, BSN Entered By: Levan Hurst on 05/18/2022 17:31:45 -------------------------------------------------------------------------------- Lower Extremity Assessment Details Patient Name: Date of Service: [name], [name] 05/18/2022 1:30 PM Medical Record Number: [id number] Patient Account Number: [id number] Date of Birth/Sex: Treating RN: [birth year] (56 y.o. Male) Levan Hurst Primary Care Meaghen Vecchiarelli: PA Haig Prophet, NO Other Clinician: Referring Faythe Heitzenrater: Treating Alleah Dearman/Extender: Jadene Pierini in Treatment: 0 Edema Assessment Assessed: [Left: No] [Right: No] Edema: [Left:  Ye] [Right: s] Calf Left: Right: Point of Measurement: 33 cm From Medial Instep 39 cm Ankle Left: Right: Point of Measurement: 10 cm From Medial Instep 24.5 cm Vascular Assessment Pulses: Dorsalis Pedis Palpable: [Left:Yes] Electronic Signature(s) Signed: 05/18/2022 5:36:06 PM By: Levan Hurst RN, BSN Entered By: Levan Hurst on 05/18/2022 14:10:33 -------------------------------------------------------------------------------- Multi Wound Chart Details Patient Name: Date of Service: [name], [name] 05/18/2022 1:30 PM Medical Record Number: [id number] Patient Account Number: [id number] Date of Birth/Sex: Treating RN: 15-May-1966 (56 y.o. Male) Adline Peals Primary Care Tiesha Marich: PA Haig Prophet, NO Other Clinician: Referring Nichael Ehly: Treating Audrena Talaga/Extender: Jadene Pierini in Treatment: 0 Vital Signs Height(in): 70 Capillary Blood Glucose(mg/dl): 113 Weight(lbs): 215 Pulse(bpm): 95 Body Mass Index(BMI): 30.8 Blood Pressure(mmHg): 111/75 Temperature(F): 98.4 Respiratory Rate(breaths/min): 18 Photos: [N/A:N/A] Left, Plantar Foot N/A N/A Wound Location: Surgical Injury N/A N/A Wounding Event: Diabetic Wound/Ulcer of the Lower N/A N/A Primary Etiology: Extremity Type II Diabetes N/A N/A Comorbid History: 05/05/2022 N/A N/A Date Acquired: 0 N/A N/A Weeks of Treatment: Open N/A N/A Wound Status: No N/A N/A Wound Recurrence: Yes N/A N/A Pending A mputation on Presentation: 14.7x9.2x1.5 N/A N/A Measurements L x W x D (cm) 106.217 N/A N/A A (cm) : rea 159.326 N/A N/A Volume (cm) : 0.00% N/A N/A % Reduction in A rea: 0.00% N/A N/A % Reduction in Volume: Grade 3 N/A N/A Classification: Large N/A N/A Exudate A mount: Serosanguineous N/A N/A Exudate Type: red, brown N/A N/A Exudate Color: Flat and Intact N/A N/A Wound Margin: Medium (34-66%) N/A N/A Granulation A mount: Pink N/A N/A Granulation Quality: Medium (34-66%) N/A N/A Necrotic A  mount: Fat Layer (Subcutaneous Tissue): Yes N/A N/A Exposed Structures: Tendon: Yes Bone: Yes Fascia: No Muscle: No Joint: No None N/A N/A Epithelialization: Debridement - Selective/Open Wound N/A N/A Debridement: Pre-procedure Verification/Time Out 14:18 N/A N/A Taken: Non-Viable Tissue N/A N/A Level: 126 N/A N/A Debridement A (sq cm): rea Curette N/A N/A Instrument: Minimum N/A N/A Bleeding: Pressure N/A N/A Hemostasis Achieved: 0 N/A N/A Procedural Pain: 0 N/A N/A Post Procedural Pain: Procedure was tolerated well N/A N/A Debridement Treatment Response: 14.7x9.2x1.5 N/A N/A Post Debridement Measurements L x W x D (cm) 159.326 N/A N/A Post Debridement Volume: (cm) Debridement N/A N/A Procedures Performed: Treatment Notes Electronic Signature(s) Signed: 05/18/2022 2:30:56 PM By: Fredirick Maudlin MD FACS Signed:  05/19/2022 5:15:13 PM By: Gelene Mink By: Duanne Guess on 05/18/2022 14:30:56 -------------------------------------------------------------------------------- Multi-Disciplinary Care Plan Details Patient Name: Date of Service: [name], [name] 05/18/2022 1:30 PM Medical Record Number: [id number] Patient Account Number: [id number] Date of Birth/Sex: Treating RN: 06-Jan-1966 (56 y.o. Male) [name] Primary Care Fulton Merry: PA Zenovia Jordan, NO Other Clinician: Referring Librado Guandique: Treating Nakeya Adinolfi/Extender: Sherryl Manges in Treatment: 0 Multidisciplinary Care Plan reviewed with physician Active Inactive Nutrition Nursing Diagnoses: Impaired glucose control: actual or potential Potential for alteratiion in Nutrition/Potential for imbalanced nutrition Goals: Patient/caregiver agrees to and verbalizes understanding of need to use nutritional supplements and/or vitamins as prescribed Date Initiated: 05/18/2022 Target Resolution Date: 06/16/2022 Goal Status: Active Patient/caregiver will maintain therapeutic glucose control Date  Initiated: 05/18/2022 Target Resolution Date: 06/16/2022 Goal Status: Active Interventions: Assess HgA1c results as ordered upon admission and as needed Assess patient nutrition upon admission and as needed per policy Provide education on elevated blood sugars and impact on wound healing Provide education on nutrition Notes: Wound/Skin Impairment Nursing Diagnoses: Impaired tissue integrity Knowledge deficit related to smoking impact on wound healing Knowledge deficit related to ulceration/compromised skin integrity Goals: Patient will demonstrate a reduced rate of smoking or cessation of smoking Date Initiated: 05/18/2022 Target Resolution Date: 06/16/2022 Goal Status: Active Patient/caregiver will verbalize understanding of skin care regimen Date Initiated: 05/18/2022 Target Resolution Date: 06/16/2022 Goal Status: Active Interventions: Assess patient/caregiver ability to obtain necessary supplies Assess patient/caregiver ability to perform ulcer/skin care regimen upon admission and as needed Assess ulceration(s) every visit Provide education on smoking Provide education on ulcer and skin care Notes: Electronic Signature(s) Signed: 05/18/2022 5:36:06 PM By: [name] RN, BSN Entered By: [name] on 05/18/2022 17:28:14 -------------------------------------------------------------------------------- Pain Assessment Details Patient Name: Date of Service: [name], [name] 05/18/2022 1:30 PM Medical Record Number: [id number] Patient Account Number: [id number] Date of Birth/Sex: Treating RN: 04-27-1966 (56 y.o. Male) [name] Primary Care Krishika Bugge: PA Zenovia Jordan, NO Other Clinician: Referring Dewayne Jurek: Treating Tannis Burstein/Extender: Sherryl Manges in Treatment: 0 Active Problems Location of Pain Severity and Description of Pain Patient Has Paino Yes Site Locations Pain Location: Pain in Ulcers With Dressing Change: Yes Duration of the Pain. Constant /  Intermittento Intermittent Rate the pain. Current Pain Level: 8 Worst Pain Level: 10 Least Pain Level: 0 Character of Pain Describe the Pain: Throbbing Pain Management and Medication Current Pain Management: Medication: Yes Cold Application: No Rest: No Massage: No Activity: No T.E.N.S.: No Heat Application: No Leg drop or elevation: No Is the Current Pain Management Adequate: Adequate How does your wound impact your activities of daily livingo Sleep: No Bathing: No Appetite: No Relationship With Others: No Bladder Continence: No Emotions: No Bowel Continence: No Work: No Toileting: No Drive: No Dressing: No Hobbies: No Electronic Signature(s) Signed: 05/18/2022 5:36:06 PM By: [name] RN, BSN Entered By: [name] on 05/18/2022 14:10:58 -------------------------------------------------------------------------------- Patient/Caregiver Education Details Patient Name: Date of Service: [name], [name] 6/15/2023andnbsp1:30 PM Medical Record Number: [id number] Patient Account Number: [id number] Date of Birth/Gender: Treating RN: [birth year] (56 y.o. Male) [name] Primary Care Physician: PA Zenovia Jordan, NO Other Clinician: Referring Physician: Treating Physician/Extender: Sherryl Manges in Treatment: 0 Education Assessment Education Provided To: Patient Education Topics Provided Elevated Blood Sugar/ Impact on Healing: Methods: Explain/Verbal Responses: State content correctly Nutrition: Methods: Explain/Verbal Responses: State content correctly Smoking and Wound Healing: Methods: Explain/Verbal Responses: State content correctly Wound/Skin Impairment: Methods: Explain/Verbal Responses: State content correctly Electronic Signature(s) Signed: 05/18/2022 5:36:06 PM By: [name] RN, BSN Entered By:  Levan Hurst on 05/18/2022 17:28:38 -------------------------------------------------------------------------------- Wound Assessment  Details Patient Name: Date of Service: [name], [name] 05/18/2022 1:30 PM Medical Record Number: [id number] Patient Account Number: [id number] Date of Birth/Sex: Treating RN: November 12, 1966 (56 y.o. Male) Levan Hurst Primary Care Collette Pescador: PA Haig Prophet, NO Other Clinician: Referring Lyn Deemer: Treating Shonn Farruggia/Extender: Jadene Pierini in Treatment: 0 Wound Status Wound Number: 1 Primary Etiology: Diabetic Wound/Ulcer of the Lower Extremity Wound Location: Left, Plantar Foot Wound Status: Open Wounding Event: Surgical Injury Comorbid History: Type II Diabetes Date Acquired: 05/05/2022 Weeks Of Treatment: 0 Clustered Wound: No Photos Wound Measurements Length: (cm) 14.7 Width: (cm) 9.2 Depth: (cm) 1.5 Area: (cm) 106.217 Volume: (cm) 159.326 % Reduction in Area: 0% % Reduction in Volume: 0% Epithelialization: None Tunneling: No Undermining: No Wound Description Classification: Grade 3 Wound Margin: Flat and Intact Exudate Amount: Large Exudate Type: Serosanguineous Exudate Color: red, brown Foul Odor After Cleansing: No Slough/Fibrino Yes Wound Bed Granulation Amount: Medium (34-66%) Exposed Structure Granulation Quality: Pink Fascia Exposed: No Necrotic Amount: Medium (34-66%) Fat Layer (Subcutaneous Tissue) Exposed: Yes Necrotic Quality: Adherent Slough Tendon Exposed: Yes Muscle Exposed: No Joint Exposed: No Bone Exposed: Yes Electronic Signature(s) Signed: 05/18/2022 5:36:06 PM By: Levan Hurst RN, BSN Entered By: Levan Hurst on 05/18/2022 14:02:40 -------------------------------------------------------------------------------- Vitals Details Patient Name: Date of Service: [name], [name] 05/18/2022 1:30 PM Medical Record Number: [id number] Patient Account Number: [id number] Date of Birth/Sex: Treating RN: May 11, 1966 (56 y.o. Male) Levan Hurst Primary Care Purcell Jungbluth: PA Haig Prophet, NO Other Clinician: Referring Taras Rask: Treating Via Rosado/Extender: Jadene Pierini in Treatment: 0 Vital Signs Time Taken: 13:44 Temperature (F): 98.4 Height (in): 70 Pulse (bpm): 95 Source: Stated Respiratory Rate (breaths/min): 18 Weight (lbs): 215 Blood Pressure (mmHg): 111/75 Source: Stated Capillary Blood Glucose (mg/dl): 113 Body Mass Index (BMI): 30.8 Reference Range: 80 - 120 mg / dl Notes glucose per pt report Electronic Signature(s) Signed: 05/18/2022 5:36:06 PM By: Levan Hurst RN, BSN Entered By: Levan Hurst on 05/18/2022 13:45:48

## 2022-05-19 NOTE — Progress Notes (Signed)
[name], [name] ([id number]) Visit Report for 05/18/2022 Chief Complaint Document Details Patient Name: Date of Service: [name], [name] 05/18/2022 1:30 PM Medical Record Number: [id number] Patient Account Number: [id number] Date of Birth/Sex: Treating RN: Apr 16, 1966 (56 y.o. Male) [name] Primary Care Provider: PA Zenovia Jordan, NO Other Clinician: Referring Provider: Treating Provider/Extender: Sherryl Manges in Treatment: 0 Information Obtained from: Patient Chief Complaint Patients presents for treatment of an open diabetic ulcer Electronic Signature(s) Signed: 05/18/2022 2:31:29 PM By: Duanne Guess MD FACS Entered By: Duanne Guess on 05/18/2022 14:31:29 -------------------------------------------------------------------------------- Debridement Details Patient Name: Date of Service: MA [name] 05/18/2022 1:30 PM Medical Record Number: [id number] Patient Account Number: [id number] Date of Birth/Sex: Treating RN: Dec 24, 1965 (56 y.o. Male) [name] Primary Care Provider: PA Zenovia Jordan, NO Other Clinician: Referring Provider: Treating Provider/Extender: Sherryl Manges in Treatment: 0 Debridement Performed for Assessment: Wound #1 Left,Plantar Foot Performed By: Physician Duanne Guess, MD Debridement Type: Debridement Severity of Tissue Pre Debridement: Bone involvement without necrosis Level of Consciousness (Pre-procedure): Awake and Alert Pre-procedure Verification/Time Out Yes - 14:18 Taken: Start Time: 14:18 T Area Debrided (L x W): otal 14 (cm) x 9 (cm) = 126 (cm) Tissue and other material debrided: Non-Viable, Biofilm Level: Non-Viable Tissue Debridement Description: Selective/Open Wound Instrument: Curette Bleeding: Minimum Hemostasis Achieved: Pressure End Time: 14:22 Procedural Pain: 0 Post Procedural Pain: 0 Response to Treatment: Procedure was tolerated well Level of Consciousness (Post- Awake and Alert procedure): Post Debridement  Measurements of Total Wound Length: (cm) 14.7 Width: (cm) 9.2 Depth: (cm) 1.5 Volume: (cm) 159.326 Character of Wound/Ulcer Post Debridement: Stable Severity of Tissue Post Debridement: Bone involvement without necrosis Post Procedure Diagnosis Same as Pre-procedure Electronic Signature(s) Signed: 05/18/2022 3:52:52 PM By: Duanne Guess MD FACS Signed: 05/18/2022 5:36:06 PM By: [name] RN, BSN Entered By: [name] on 05/18/2022 14:21:55 -------------------------------------------------------------------------------- HPI Details Patient Name: Date of Service: MA [name] 05/18/2022 1:30 PM Medical Record Number: [id number] Patient Account Number: [id number] Date of Birth/Sex: Treating RN: May 09, 1966 (56 y.o. Male) [name] Primary Care Provider: PA Zenovia Jordan, NO Other Clinician: Referring Provider: Treating Provider/Extender: Sherryl Manges in Treatment: 0 History of Present Illness HPI Description: ADMISSION 05/18/2022 This is a 56 year old poorly controlled type II diabetic (last A1c 11.5%). In January of this year, he presented to the hospital with an abscess in his left foot. He also had a foreign body present. It was removed in the ER and he was admitted to the hospital for IV antibiotics. He was subsequently discharged on oral antibiotics. Due to financial concerns, he has not taken his diabetic medications (metformin and 70/30 insulin) and as a result, presented back to the emergency room on June 1 with worsening findings, including frank pus and osteomyelitis on MRI. BKA was recommended, but he declined. He was taken to the operating room by Dr. Lajoyce Corners who performed an extensive debridement. Cultures were taken and he was placed on IV antibiotics. He saw infectious disease while in the hospital who prescribed a 6-week course of IV antibiotics including cefazolin as well as oral metronidazole and levofloxacin. The infectious disease provider also  indicated that he would benefit from below-knee amputation, but the patient desired another opinion and therefore he has been referred to wound care center for further evaluation and management. ABIs obtained while he was in the hospital demonstrate adequate blood flow. Essentially the entire bottom of the patient's left foot has been removed. The heads of the majority of his metatarsal bones are exposed along with their accompanying  tendons. Muscle and fat are exposed as well. The wound surface is fairly dry; the patient reports that he was not given any specific wound care instructions other than to place a dry dressing on it after showering. There is no odor or purulent drainage identified. Electronic Signature(s) Signed: 05/18/2022 2:37:59 PM By: Duanne Guess MD FACS Entered By: Duanne Guess on 05/18/2022 14:37:59 -------------------------------------------------------------------------------- Physical Exam Details Patient Name: Date of Service: MA [name] 05/18/2022 1:30 PM Medical Record Number: [id number] Patient Account Number: [id number] Date of Birth/Sex: Treating RN: February 02, 1966 (56 y.o. Male) [name] Primary Care Provider: PA Zenovia Jordan, NO Other Clinician: Referring Provider: Treating Provider/Extender: Sherryl Manges in Treatment: 0 Constitutional . . . . No acute distress. Respiratory Normal work of breathing on room air.. Notes 05/18/2022: Essentially the entire bottom of the patient's left foot has been removed. The heads of the majority of his metatarsal bones are exposed along with their accompanying tendons. Muscle and fat are exposed as well. The wound surface is fairly dry. There is no odor or purulent drainage identified. Electronic Signature(s) Signed: 05/18/2022 2:39:14 PM By: Duanne Guess MD FACS Entered By: Duanne Guess on 05/18/2022 14:39:14 -------------------------------------------------------------------------------- Physician  Orders Details Patient Name: Date of Service: MA [name] 05/18/2022 1:30 PM Medical Record Number: [id number] Patient Account Number: [id number] Date of Birth/Sex: Treating RN: 12/19/1965 (56 y.o. Male) [name] Primary Care Provider: PA Zenovia Jordan, NO Other Clinician: Referring Provider: Treating Provider/Extender: Sherryl Manges in Treatment: 0 Verbal / Phone Orders: No Diagnosis Coding ICD-10 Coding Code Description E11.9 Type 2 diabetes mellitus without complications E11.621 Type 2 diabetes mellitus with foot ulcer L97.524 Non-pressure chronic ulcer of other part of left foot with necrosis of bone M86.179 Other acute osteomyelitis, unspecified ankle and foot Follow-up Appointments ppointment in 1 week. - Dr. Lady Gary - Room 2 - Friday 6/23 at 10:45 am Return A Bathing/ Shower/ Hygiene May shower and wash wound with soap and water. - Prior to dressing change Edema Control - Lymphedema / SCD / Other Elevate legs to the level of the heart or above for 30 minutes daily and/or when sitting, a frequency of: - throughout the day Moisturize legs daily. Off-Loading Other: - ABSOLUTELY NO PRESSURE ON LEFT FOOT! Wound Treatment Wound #1 - Foot Wound Laterality: Plantar, Left Cleanser: Soap and Water 1 x Per Day Discharge Instructions: May shower and wash wound with dial antibacterial soap and water prior to dressing change. Cleanser: Wound Cleanser 1 x Per Day Discharge Instructions: Cleanse the wound with wound cleanser prior to applying a clean dressing using gauze sponges, not tissue or cotton balls. Prim Dressing: Dakin's Solution 0.25%, 16 (oz) 1 x Per Day ary Discharge Instructions: Moisten gauze with Dakin's solution Secondary Dressing: ABD Pad, 8x10 1 x Per Day Discharge Instructions: Apply over primary dressing as directed. Secondary Dressing: Woven Gauze Sponge, Non-Sterile 4x4 in 1 x Per Day Discharge Instructions: Apply over primary dressing as directed. Secured  With: Elastic Bandage 4 inch (ACE bandage) 1 x Per Day Discharge Instructions: Secure with ACE bandage as directed. Secured With: [employer], 4.5x3.1 (in/yd) 1 x Per Day Discharge Instructions: Secure with Kerlix as directed. Patient Medications llergies: No Known Drug Allergies A Notifications Medication Indication Start End 05/18/2022 Dakin's Solution DOSE miscellaneous 0.25 % solution - use as directed for dressing changes Electronic Signature(s) Signed: 05/18/2022 2:43:40 PM By: Duanne Guess MD FACS Entered By: Duanne Guess on 05/18/2022 14:43:40 -------------------------------------------------------------------------------- Problem List Details Patient Name: Date of Service: MA RTIN, Nedra Hai  05/18/2022 1:30 PM Medical Record Number: [id number] Patient Account Number: [id number] Date of Birth/Sex: Treating RN: 07/11/1966 (56 y.o. Male) [name] Primary Care Provider: PA Zenovia Jordan, NO Other Clinician: Referring Provider: Treating Provider/Extender: Sherryl Manges in Treatment: 0 Active Problems ICD-10 Encounter Code Description Active Date MDM Diagnosis E11.65 Type 2 diabetes mellitus with hyperglycemia 05/18/2022 No Yes E11.621 Type 2 diabetes mellitus with foot ulcer 05/18/2022 No Yes L97.524 Non-pressure chronic ulcer of other part of left foot with necrosis of bone 05/18/2022 No Yes M86.179 Other acute osteomyelitis, unspecified ankle and foot 05/18/2022 No Yes Inactive Problems Resolved Problems Electronic Signature(s) Signed: 05/18/2022 2:30:16 PM By: Duanne Guess MD FACS Previous Signature: 05/18/2022 2:12:12 PM Version By: Duanne Guess MD FACS Entered By: Duanne Guess on 05/18/2022 14:30:16 -------------------------------------------------------------------------------- Progress Note Details Patient Name: Date of Service: MA ANUAR, WALGREN 05/18/2022 1:30 PM Medical Record Number: [id number] Patient Account Number: [id number] Date of  Birth/Sex: Treating RN: [birth year] (56 y.o. Male) [name] Primary Care Provider: PA Zenovia Jordan, NO Other Clinician: Referring Provider: Treating Provider/Extender: Sherryl Manges in Treatment: 0 Subjective Chief Complaint Information obtained from Patient Patients presents for treatment of an open diabetic ulcer History of Present Illness (HPI) ADMISSION 05/18/2022 This is a 56 year old poorly controlled type II diabetic (last A1c 11.5%). In January of this year, he presented to the hospital with an abscess in his left foot. He also had a foreign body present. It was removed in the ER and he was admitted to the hospital for IV antibiotics. He was subsequently discharged on oral antibiotics. Due to financial concerns, he has not taken his diabetic medications (metformin and 70/30 insulin) and as a result, presented back to the emergency room on June 1 with worsening findings, including frank pus and osteomyelitis on MRI. BKA was recommended, but he declined. He was taken to the operating room by Dr. Lajoyce Corners who performed an extensive debridement. Cultures were taken and he was placed on IV antibiotics. He saw infectious disease while in the hospital who prescribed a 6-week course of IV antibiotics including cefazolin as well as oral metronidazole and levofloxacin. The infectious disease provider also indicated that he would benefit from below-knee amputation, but the patient desired another opinion and therefore he has been referred to wound care center for further evaluation and management. ABIs obtained while he was in the hospital demonstrate adequate blood flow. Essentially the entire bottom of the patient's left foot has been removed. The heads of the majority of his metatarsal bones are exposed along with their accompanying tendons. Muscle and fat are exposed as well. The wound surface is fairly dry; the patient reports that he was not given any specific wound care instructions  other than to place a dry dressing on it after showering. There is no odor or purulent drainage identified. Patient History Information obtained from Patient. Allergies No Known Drug Allergies Family History Unknown History. Social History Current every day smoker, Marital Status - Single, Alcohol Use - Never, Drug Use - Prior History - Cocaine, Caffeine Use - Rarely. Medical History Endocrine Patient has history of Type II Diabetes Patient is treated with Insulin, Oral Agents. Blood sugar is tested. Review of Systems (ROS) Constitutional Symptoms (General Health) Denies complaints or symptoms of Fatigue, Fever, Chills, Marked Weight Change. Eyes Denies complaints or symptoms of Dry Eyes, Vision Changes, Glasses / Contacts. Ear/Nose/Mouth/Throat Denies complaints or symptoms of Chronic sinus problems or rhinitis. Respiratory Denies complaints or symptoms of Chronic or frequent coughs, Shortness of Breath. Cardiovascular Denies  complaints or symptoms of Chest pain. Gastrointestinal Denies complaints or symptoms of Frequent diarrhea, Nausea, Vomiting. Genitourinary Denies complaints or symptoms of Frequent urination. Integumentary (Skin) Complains or has symptoms of Wounds - wound on left foot. Musculoskeletal Denies complaints or symptoms of Muscle Pain, Muscle Weakness. Neurologic Denies complaints or symptoms of Numbness/parasthesias. Psychiatric Denies complaints or symptoms of Claustrophobia, Suicidal. Objective Constitutional No acute distress. Vitals Time Taken: 1:44 PM, Height: 70 in, Source: Stated, Weight: 215 lbs, Source: Stated, BMI: 30.8, Temperature: 98.4 F, Pulse: 95 bpm, Respiratory Rate: 18 breaths/min, Blood Pressure: 111/75 mmHg, Capillary Blood Glucose: 113 mg/dl. General Notes: glucose per pt report Respiratory Normal work of breathing on room air.. General Notes: 05/18/2022: Essentially the entire bottom of the patient's left foot has been removed. The  heads of the majority of his metatarsal bones are exposed along with their accompanying tendons. Muscle and fat are exposed as well. The wound surface is fairly dry. There is no odor or purulent drainage identified. Integumentary (Hair, Skin) Wound #1 status is Open. Original cause of wound was Surgical Injury. The date acquired was: 05/05/2022. The wound is located on the Left,Plantar Foot. The wound measures 14.7cm length x 9.2cm width x 1.5cm depth; 106.217cm^2 area and 159.326cm^3 volume. There is bone, tendon, and Fat Layer (Subcutaneous Tissue) exposed. There is no tunneling or undermining noted. There is a large amount of serosanguineous drainage noted. The wound margin is flat and intact. There is medium (34-66%) pink granulation within the wound bed. There is a medium (34-66%) amount of necrotic tissue within the wound bed including Adherent Slough. Assessment Active Problems ICD-10 Type 2 diabetes mellitus with hyperglycemia Type 2 diabetes mellitus with foot ulcer Non-pressure chronic ulcer of other part of left foot with necrosis of bone Other acute osteomyelitis, unspecified ankle and foot Procedures Wound #1 Pre-procedure diagnosis of Wound #1 is a Diabetic Wound/Ulcer of the Lower Extremity located on the Left,Plantar Foot .Severity of Tissue Pre Debridement is: Bone involvement without necrosis. There was a Selective/Open Wound Non-Viable Tissue Debridement with a total area of 126 sq cm performed by Duanne Guess, MD. With the following instrument(s): Curette to remove Non-Viable tissue/material. Material removed includes Biofilm. No specimens were taken. A time out was conducted at 14:18, prior to the start of the procedure. A Minimum amount of bleeding was controlled with Pressure. The procedure was tolerated well with a pain level of 0 throughout and a pain level of 0 following the procedure. Post Debridement Measurements: 14.7cm length x 9.2cm width x 1.5cm depth;  159.326cm^3 volume. Character of Wound/Ulcer Post Debridement is stable. Severity of Tissue Post Debridement is: Bone involvement without necrosis. Post procedure Diagnosis Wound #1: Same as Pre-Procedure Plan Follow-up Appointments: Return Appointment in 1 week. - Dr. Lady Gary - Room 2 - Friday 6/23 at 10:45 am Bathing/ Shower/ Hygiene: May shower and wash wound with soap and water. - Prior to dressing change Edema Control - Lymphedema / SCD / Other: Elevate legs to the level of the heart or above for 30 minutes daily and/or when sitting, a frequency of: - throughout the day Moisturize legs daily. Off-Loading: Other: - ABSOLUTELY NO PRESSURE ON LEFT FOOT! The following medication(s) was prescribed: Dakin's Solution miscellaneous 0.25 % solution use as directed for dressing changes starting 05/18/2022 WOUND #1: - Foot Wound Laterality: Plantar, Left Cleanser: Soap and Water 1 x Per Day/ Discharge Instructions: May shower and wash wound with dial antibacterial soap and water prior to dressing change. Cleanser: Wound Cleanser 1 x  Per Day/ Discharge Instructions: Cleanse the wound with wound cleanser prior to applying a clean dressing using gauze sponges, not tissue or cotton balls. Prim Dressing: Dakin's Solution 0.25%, 16 (oz) 1 x Per Day/ ary Discharge Instructions: Moisten gauze with Dakin's solution Secondary Dressing: ABD Pad, 8x10 1 x Per Day/ Discharge Instructions: Apply over primary dressing as directed. Secondary Dressing: Woven Gauze Sponge, Non-Sterile 4x4 in 1 x Per Day/ Discharge Instructions: Apply over primary dressing as directed. Secured With: Elastic Bandage 4 inch (ACE bandage) 1 x Per Day/ Discharge Instructions: Secure with ACE bandage as directed. Secured With: [employer] Roll Sterile, 4.5x3.1 (in/yd) 1 x Per Day/ Discharge Instructions: Secure with Kerlix as directed. 05/18/2022: This is a poorly controlled type II diabetic who is also a smoker and only recently quit using  cocaine. He had an extensive soft tissue infection of his left foot with evidence of osteomyelitis by MRI. He underwent surgical debridement e. Ssentially the entire bottom of the patient's left foot has been removed. The heads of the majority of his metatarsal bones are exposed along with their accompanying tendons. Muscle and fat are exposed as well. The wound surface is fairly dry. There is no odor or purulent drainage identified. I debrided slough and additional nonviable tissue from the patient's wound. I had a frank discussion with both him and his sister-in-law who accompanied him today. I think the likelihood of foot salvage is quite slim, particularly in light of the poor glucose control and continued smoking. He also does not have insurance at this time but will have coverage starting July 1. This does limit our options as far as wound management. For now, we will just do moist dressing changes with Dakin solution. He needs to stay off the foot completely and his sister-in-law reports that they have ordered a knee scooter for him. Once he has insurance, we may be able to entertain more advanced therapeutic options. I would like to see him back in 1 week. Electronic Signature(s) Signed: 05/18/2022 2:44:28 PM By: Duanne Guessannon, Jennife Zaucha MD FACS Previous Signature: 05/18/2022 2:42:29 PM Version By: Duanne Guessannon, Cathaleen Korol MD FACS Entered By: Duanne Guessannon, Yer Castello on 05/18/2022 14:44:28 -------------------------------------------------------------------------------- HxROS Details Patient Name: Date of Service: MA Lanny HurstRTIN, Cyprian 05/18/2022 1:30 PM Medical Record Number: [id number] Patient Account Number: [id number] Date of Birth/Sex: Treating RN: July 12, 1966 (56 y.o. Male) Zandra AbtsLynch, Shatara Primary Care Provider: PA Zenovia JordanIENT, NO Other Clinician: Referring Provider: Treating Provider/Extender: Sherryl Mangesannon, Annastasia Haskins Weeks in Treatment: 0 Information Obtained From Patient Constitutional Symptoms (General Health) Complaints  and Symptoms: Negative for: Fatigue; Fever; Chills; Marked Weight Change Eyes Complaints and Symptoms: Negative for: Dry Eyes; Vision Changes; Glasses / Contacts Ear/Nose/Mouth/Throat Complaints and Symptoms: Negative for: Chronic sinus problems or rhinitis Respiratory Complaints and Symptoms: Negative for: Chronic or frequent coughs; Shortness of Breath Cardiovascular Complaints and Symptoms: Negative for: Chest pain Gastrointestinal Complaints and Symptoms: Negative for: Frequent diarrhea; Nausea; Vomiting Genitourinary Complaints and Symptoms: Negative for: Frequent urination Integumentary (Skin) Complaints and Symptoms: Positive for: Wounds - wound on left foot Musculoskeletal Complaints and Symptoms: Negative for: Muscle Pain; Muscle Weakness Neurologic Complaints and Symptoms: Negative for: Numbness/parasthesias Psychiatric Complaints and Symptoms: Negative for: Claustrophobia; Suicidal Hematologic/Lymphatic Endocrine Medical History: Positive for: Type II Diabetes Time with diabetes: over 10 years Treated with: Insulin, Oral agents Blood sugar tested every day: Yes Tested : twice a day Immunological Oncologic Immunizations Pneumococcal Vaccine: Received Pneumococcal Vaccination: No Implantable Devices None Family and Social History Unknown History: Yes; Current every day smoker; Marital Status - Single; Alcohol  Use: Never; Drug Use: Prior History - Cocaine; Caffeine Use: Rarely; Financial Concerns: No; Food, Clothing or Shelter Needs: No; Support System Lacking: No; Transportation Concerns: No Electronic Signature(s) Signed: 05/18/2022 3:52:52 PM By: Duanne Guess MD FACS Signed: 05/18/2022 5:36:06 PM By: [name] RN, BSN Entered By: [name] on 05/18/2022 14:08:07 -------------------------------------------------------------------------------- SuperBill Details Patient Name: Date of Service: MA RAYMOUND, KATICH 05/18/2022 Medical Record Number:  [id number] Patient Account Number: [id number] Date of Birth/Sex: Treating RN: [birth year] (55 y.o. Male) [name] Primary Care Provider: PA Zenovia Jordan, NO Other Clinician: Referring Provider: Treating Provider/Extender: Sherryl Manges in Treatment: 0 Diagnosis Coding ICD-10 Codes Code Description E11.65 Type 2 diabetes mellitus with hyperglycemia E11.621 Type 2 diabetes mellitus with foot ulcer L97.524 Non-pressure chronic ulcer of other part of left foot with necrosis of bone M86.179 Other acute osteomyelitis, unspecified ankle and foot Facility Procedures CPT4 Code: [id number] Description: 99213 - WOUND CARE VISIT-LEV 3 EST PT Modifier: 25 Quantity: 1 CPT4 Code: [id number] Description: 97597 - DEBRIDE WOUND 1ST 20 SQ CM OR < ICD-10 Diagnosis Description L97.524 Non-pressure chronic ulcer of other part of left foot with necrosis of bone Modifier: Quantity: 1 Physician Procedures : CPT4 Code Description Modifier [id number] - WC PHYS LEVEL 4 - NEW PT 25 ICD-10 Diagnosis Description E11.621 Type 2 diabetes mellitus with foot ulcer L97.524 Non-pressure chronic ulcer of other part of left foot with necrosis of bone E11.65 Type 2  diabetes mellitus with hyperglycemia M86.179 Other acute osteomyelitis, unspecified ankle and foot Quantity: 1 : [id number] 97597 - WC PHYS DEBR WO ANESTH 20 SQ CM ICD-10 Diagnosis Description L97.524 Non-pressure chronic ulcer of other part of left foot with necrosis of bone Quantity: 1 : [id number] 97598 - WC PHYS DEBR WO ANESTH EA ADD 20 CM ICD-10 Diagnosis Description L97.524 Non-pressure chronic ulcer of other part of left foot with necrosis of bone Quantity: 6 Electronic Signature(s) Signed: 05/18/2022 5:36:06 PM By: [name] RN, BSN Signed: 05/19/2022 7:27:00 AM By: Duanne Guess MD FACS Previous Signature: 05/18/2022 2:44:52 PM Version By: Duanne Guess MD FACS Entered By: [name] on 05/18/2022 17:29:40

## 2022-05-21 ENCOUNTER — Encounter: Payer: Self-pay | Admitting: Orthopedic Surgery

## 2022-05-21 NOTE — Progress Notes (Signed)
Office Visit Note   Patient: [name]           Date of Birth: 11-09-1966           MRN: [id number] Visit Date: 05/18/2022              Requested by: Nadara Mustard, MD [address] [place],  [place] [address] PCP: Patient, No Pcp Per  Chief Complaint  Patient presents with   Left Foot - Routine Post Op    05/05/2022 left foot debridement       HPI: Patient is a 56 year old gentleman who presents in follow-up for debridement massive necrotic ulceration involving the entire plantar aspect the left foot with exposed bone.  Patient is currently on crutches nonweightbearing.  Patient states he has appointment with wound care this week.  He is on IV antibiotics and oral antibiotics.  Assessment & Plan: Visit Diagnoses:  1. Diabetic ulcer of left midfoot associated with type 2 diabetes mellitus, with necrosis of bone (HCC)     Plan: Discussed with the patient recommendation again to proceed with a transtibial amputation.  With no healthy viable plantar soft tissue patient does not have foot salvage options.  Patient will be disabled for at least 12 months.  Patient will follow-up with the wound clinic for another opinion.  Follow-Up Instructions: Return in about 2 weeks (around 06/01/2022).   Ortho Exam  Patient is alert, oriented, no adenopathy, well-dressed, normal affect, normal respiratory effort. Examination patient has exposed necrotic metatarsal heads across the forefoot.  The plantar aspect of the foot has now healthy viable tissue essentially the entire plantar soft tissue envelope of the foot was excised due to necrotic infected purulent soft tissue.  Imaging: No results found.    Labs: Lab Results  Component Value Date   HGBA1C 11.5 (H) 05/07/2022   HGBA1C 12.0 (H) 12/14/2021   ESRSEDRATE 107 (H) 05/06/2022   ESRSEDRATE 65 (H) 05/04/2022   ESRSEDRATE 25 (H) 12/14/2021   CRP 26.4 (H) 05/06/2022   CRP 27.3 (H) 05/04/2022   CRP 5.1 (H) 12/14/2021    REPTSTATUS 05/10/2022 FINAL 05/05/2022   GRAMSTAIN  05/05/2022    RARE WBC PRESENT,BOTH PMN AND MONONUCLEAR ABUNDANT GRAM POSITIVE COCCI ABUNDANT GRAM NEGATIVE RODS    CULT  05/05/2022    FEW STREPTOCOCCUS GALLOLYTICUS FEW STAPHYLOCOCCUS AUREUS FEW STREPTOCOCCUS GORDONII FEW PREVOTELLA SPECIES BETA LACTAMASE POSITIVE Performed at [facility], [address], [place], [place] [address]    [facility] STAPHYLOCOCCUS AUREUS 05/05/2022   LABORGA STREPTOCOCCUS GORDONII 05/05/2022   LABORGA STREPTOCOCCUS GALLOLYTICUS 05/05/2022     Lab Results  Component Value Date   ALBUMIN 2.2 (L) 05/09/2022   ALBUMIN 2.1 (L) 05/08/2022   ALBUMIN 2.7 (L) 05/07/2022    Lab Results  Component Value Date   MG 1.9 05/09/2022   MG 1.9 05/08/2022   MG 2.3 05/07/2022   No results found for: "VD25OH"  No results found for: "PREALBUMIN"    Latest Ref Rng & Units 05/11/2022    4:46 AM 05/09/2022    1:33 AM 05/08/2022    3:50 AM  CBC EXTENDED  WBC 4.0 - 10.5 K/uL 9.2  11.8  10.4   RBC 4.22 - 5.81 MIL/uL 3.82  3.68  3.79   Hemoglobin 13.0 - 17.0 g/dL 60.1  9.8  09.3   HCT 23.5 - 52.0 % 32.1  30.2  31.4   Platelets 150 - 400 K/uL 412  340  313   NEUT# 1.7 -  7.7 K/uL 5.8  7.9  6.5   Lymph# 0.7 - 4.0 K/uL 2.1  2.5  2.7      There is no height or weight on file to calculate BMI.  Orders:  No orders of the defined types were placed in this encounter.  Meds ordered this encounter  Medications   oxyCODONE (OXY IR/ROXICODONE) 5 MG immediate release tablet    Sig: Take 1 tablet (5 mg total) by mouth every 4 (four) hours as needed for moderate pain (pain score 4-6).    Dispense:  15 tablet    Refill:  0   temazepam (RESTORIL) 7.5 MG capsule    Sig: Take 1 capsule (7.5 mg total) by mouth at bedtime as needed for sleep.    Dispense:  30 capsule    Refill:  0     Procedures: No procedures performed  Clinical Data: No additional findings.  ROS:  All other systems negative, except as noted  in the HPI. Review of Systems  Objective: Vital Signs: There were no vitals taken for this visit.  Specialty Comments:  No specialty comments available.  PMFS History: Patient Active Problem List   Diagnosis Date Noted   Tobacco abuse 05/06/2022   Hyperkalemia 05/06/2022   Type 2 diabetes mellitus (HCC) 05/05/2022   Obesity (BMI 30-39.9) 05/05/2022   Cellulitis and abscess of foot    AKI (acute kidney injury) (HCC) 05/04/2022   Injury of left foot    Diabetic foot ulcer (HCC) 12/14/2021   Past Medical History:  Diagnosis Date   Diabetes mellitus without complication (HCC)     History reviewed. No pertinent family history.  Past Surgical History:  Procedure Laterality Date   I & D EXTREMITY Left 05/05/2022   Procedure: LEFT FOOT DEBRIDEMENT;  Surgeon: Nadara Mustard, MD;  Location: [facility] OR;  Service: Orthopedics;  Laterality: Left;   TONSILLECTOMY     Social History   Occupational History   Not on file  Tobacco Use   Smoking status: Every Day    Packs/day: 1.00    Types: Cigarettes   Smokeless tobacco: Never  Vaping Use   Vaping Use: Never used  Substance and Sexual Activity   Alcohol use: No   Drug use: No   Sexual activity: Yes

## 2022-05-23 ENCOUNTER — Ambulatory Visit (INDEPENDENT_AMBULATORY_CARE_PROVIDER_SITE_OTHER): Payer: Self-pay | Admitting: Internal Medicine

## 2022-05-23 ENCOUNTER — Encounter: Payer: Self-pay | Admitting: Internal Medicine

## 2022-05-23 ENCOUNTER — Other Ambulatory Visit: Payer: Self-pay

## 2022-05-23 VITALS — BP 117/75 | HR 98 | Temp 98.1°F | Wt 226.0 lb

## 2022-05-23 DIAGNOSIS — M86172 Other acute osteomyelitis, left ankle and foot: Secondary | ICD-10-CM

## 2022-05-23 NOTE — Progress Notes (Signed)
Patient Active Problem List   Diagnosis Date Noted   Tobacco abuse 05/06/2022   Hyperkalemia 05/06/2022   Type 2 diabetes mellitus ([place]) 05/05/2022   Obesity (BMI 30-39.9) 05/05/2022   Cellulitis and abscess of foot    AKI (acute kidney injury) ([place]) 05/04/2022   Injury of left foot    Diabetic foot ulcer ([place]) 12/14/2021    Patient's Medications  New Prescriptions   No medications on file  Previous Medications   ACETAMINOPHEN (TYLENOL) 500 MG TABLET    Take 2,000 mg by mouth every 6 (six) hours as needed for mild pain or moderate pain.   CEFAZOLIN (ANCEF) IVPB    Inject 2 g into the vein every 8 (eight) hours. Indication:  L-DFI/osteo First Dose: Yes Last Day of Therapy:  06/16/22 Labs - Once weekly:  CBC/D and BMP, Labs - Every other week:  ESR and CRP Method of administration: IV Push Method of administration may be changed at the discretion of home infusion pharmacist based upon assessment of the patient and/or caregiver's ability to self-administer the medication ordered.   INSULIN NPH-REGULAR HUMAN (70-30) 100 UNIT/ML INJECTION    Inject 30 Units into the skin 2 (two) times daily with a meal.   INSULIN PEN NEEDLE 32G X 4 MM MISC    Use as directed to inject insulin up to 4 times daily.   INSULIN PEN NEEDLE 32G X 4 MM MISC    Use to inject insulin up to 4 times daily as directed.   LEVOFLOXACIN (LEVAQUIN) 750 MG TABLET    Take 1 tablet (750 mg total) by mouth daily.   METFORMIN (GLUCOPHAGE) 500 MG TABLET    Take 1 tablet (500 mg total) by mouth 2 (two) times daily.   METRONIDAZOLE (FLAGYL) 500 MG TABLET    Take 1 tablet (500 mg total) by mouth 2 (two) times daily. Stop date 06/16/22 when IV antibiotics are completed   NICOTINE (NICODERM CQ - DOSED IN MG/24 HOURS) 21 MG/24HR PATCH    Place 1 patch (21 mg total) onto the skin daily.   OXYCODONE (OXY IR/ROXICODONE) 5 MG IMMEDIATE RELEASE TABLET    Take 1 tablet (5 mg total) by mouth every 4 (four) hours as needed for  moderate pain (pain score 4-6).   SODIUM CHLORIDE FLUSH (SALINE FLUSH) 0.9 % SOLN    Inject 10 mLs into the vein in the morning, at noon, and at bedtime.   TEMAZEPAM (RESTORIL) 7.5 MG CAPSULE    Take 1 capsule (7.5 mg total) by mouth at bedtime as needed for sleep.  Modified Medications   No medications on file  Discontinued Medications   No medications on file    Subjective: 56 year old male with past medical history as below presents for hospital follow-up.  He was admitted to [facility] 6/1 - 6/8 for fifth metatarsal osteomyelitis.  He was admitted with WBC 19 K, temp of 101.  MRI showed left foot fifth metatarsal osteomyelitis and extensive soft tissue gas/myositis in the plantar region.  Taken to the OR with Dr. Sharol Given for debridement.  Noted to have extensive necrosis of tissue. OR Cx +Klebsiella oxytoca, MSSA, strep gordonii, strep cellulitis, Prevotella species beta-lactamase positive.  Ortho recommended transtibial amputation during hospitalization and a follow-up appointment on 6/15.  In agreement with need for amputation, although patient wanted to do a 6-week course of IV antibiotics for, " try anything he can". Today: 05/23/2022.  Patient is on cefazolin, metronidazole,  Levaquin with EOT 06/15/2022.  Brother is present at visit.  Patient notes he has purulent drainage from his foot.  He denies fevers and chills. Review of Systems: Review of Systems  All other systems reviewed and are negative.   Past Medical History:  Diagnosis Date   Diabetes mellitus without complication (HCC)     Social History   Tobacco Use   Smoking status: Every Day    Packs/day: 1.00    Types: Cigarettes   Smokeless tobacco: Never  Vaping Use   Vaping Use: Never used  Substance Use Topics   Alcohol use: No   Drug use: No    No family history on file.  No Known Allergies  Health Maintenance  Topic Date Due   COVID-19 Vaccine (1) Never done   FOOT EXAM  Never done   OPHTHALMOLOGY EXAM  Never  done   URINE MICROALBUMIN  Never done   Hepatitis C Screening  Never done   COLONOSCOPY (Pts 45-52yr Insurance coverage will need to be confirmed)  Never done   Zoster Vaccines- Shingrix (1 of 2) Never done   INFLUENZA VACCINE  07/04/2022   HEMOGLOBIN A1C  11/06/2022   TETANUS/TDAP  06/01/2024   HIV Screening  Completed   HPV VACCINES  Aged Out    Objective:  Vitals:   05/23/22 0940  Weight: 226 lb (102.5 kg)   Body mass index is 33.37 kg/m.  Physical Exam Constitutional:      General: He is not in acute distress.    Appearance: He is normal weight. He is not toxic-appearing.  HENT:     Head: Normocephalic and atraumatic.     Right Ear: External ear normal.     Left Ear: External ear normal.     Nose: No congestion or rhinorrhea.     Mouth/Throat:     Mouth: Mucous membranes are moist.     Pharynx: Oropharynx is clear.  Eyes:     Extraocular Movements: Extraocular movements intact.     Conjunctiva/sclera: Conjunctivae normal.     Pupils: Pupils are equal, round, and reactive to light.  Cardiovascular:     Rate and Rhythm: Normal rate and regular rhythm.     Heart sounds: No murmur heard.    No friction rub. No gallop.  Pulmonary:     Effort: Pulmonary effort is normal.     Breath sounds: Normal breath sounds.  Abdominal:     General: Abdomen is flat. Bowel sounds are normal.     Palpations: Abdomen is soft.  Musculoskeletal:        General: No swelling. Normal range of motion.     Cervical back: Normal range of motion and neck supple.  Skin:    General: Skin is warm and dry.  Neurological:     General: No focal deficit present.     Mental Status: He is oriented to person, place, and time.  Psychiatric:        Mood and Affect: Mood normal.     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 He has been on broad spectrum antibiotics with continued drainage of wound and labs c/w ongoing infection. Will get blood Cx to evaluate for bacteremia. Repeat Imaging in the setting of continued purulence from wound. He states he plans to f/u with Ortho 6/29  and will let them know of his decision for amputation. -Obtain blood Cx -Obtain MRI left foot as labs shows signs of possible infection -Follow-up in 2 weeks on 6/30 with Dr. West Bali with MRI results and pt's decision for amputation. I suspect when antibiotics end on 7/13 pt has  high probability of having a systemic infection/bacteremic due to lack of source control of foot.   I spent more than 45 minutes for this patient encounter including reviewing data/chart, and coordinating care and >50% direct face to face time providing counseling/discussing diagnostics/treatment plan with patient   Laurice Record, [place] [facility] 05/23/2022, 9:40 AM

## 2022-05-26 ENCOUNTER — Encounter (HOSPITAL_BASED_OUTPATIENT_CLINIC_OR_DEPARTMENT_OTHER): Payer: Self-pay | Admitting: General Surgery

## 2022-05-28 ENCOUNTER — Other Ambulatory Visit: Payer: Self-pay

## 2022-05-28 ENCOUNTER — Emergency Department: Payer: Self-pay

## 2022-05-28 ENCOUNTER — Emergency Department (HOSPITAL_COMMUNITY)
Admission: EM | Admit: 2022-05-28 | Discharge: 2022-05-28 | Disposition: A | Discharge status: Home / Self Care | Payer: Self-pay | Attending: Emergency Medicine | Admitting: Emergency Medicine

## 2022-05-28 ENCOUNTER — Encounter (HOSPITAL_COMMUNITY): Payer: Self-pay | Admitting: Pharmacy Technician

## 2022-05-28 DIAGNOSIS — Y848 Other medical procedures as the cause of abnormal reaction of the patient, or of later complication, without mention of misadventure at the time of the procedure: Secondary | ICD-10-CM | POA: Insufficient documentation

## 2022-05-28 DIAGNOSIS — B9562 Methicillin resistant Staphylococcus aureus infection as the cause of diseases classified elsewhere: Secondary | ICD-10-CM | POA: Insufficient documentation

## 2022-05-28 DIAGNOSIS — E11628 Type 2 diabetes mellitus with other skin complications: Secondary | ICD-10-CM

## 2022-05-28 DIAGNOSIS — Z794 Long term (current) use of insulin: Secondary | ICD-10-CM | POA: Insufficient documentation

## 2022-05-28 DIAGNOSIS — T82524A Displacement of infusion catheter, initial encounter: Secondary | ICD-10-CM | POA: Insufficient documentation

## 2022-05-28 DIAGNOSIS — Z7984 Long term (current) use of oral hypoglycemic drugs: Secondary | ICD-10-CM | POA: Insufficient documentation

## 2022-05-28 DIAGNOSIS — Z22322 Carrier or suspected carrier of Methicillin resistant Staphylococcus aureus: Secondary | ICD-10-CM

## 2022-05-28 DIAGNOSIS — E11621 Type 2 diabetes mellitus with foot ulcer: Secondary | ICD-10-CM | POA: Insufficient documentation

## 2022-05-28 DIAGNOSIS — L97529 Non-pressure chronic ulcer of other part of left foot with unspecified severity: Secondary | ICD-10-CM | POA: Insufficient documentation

## 2022-05-28 DIAGNOSIS — Z95828 Presence of other vascular implants and grafts: Secondary | ICD-10-CM

## 2022-05-28 LAB — CULTURE, BLOOD (SINGLE)
MICRO NUMBER:: 13548755
MICRO NUMBER:: 13548879
Result:: NO GROWTH
Result:: NO GROWTH
SPECIMEN QUALITY:: ADEQUATE
SPECIMEN QUALITY:: ADEQUATE

## 2022-05-28 LAB — CBC WITH DIFFERENTIAL/PLATELET
Abs Immature Granulocytes: 0.02 10*3/uL (ref 0.00–0.07)
Basophils Absolute: 0 10*3/uL (ref 0.0–0.1)
Basophils Relative: 0 %
Eosinophils Absolute: 0.3 10*3/uL (ref 0.0–0.5)
Eosinophils Relative: 4 %
HCT: 34.7 % — ABNORMAL LOW (ref 39.0–52.0)
Hemoglobin: 10.8 g/dL — ABNORMAL LOW (ref 13.0–17.0)
Immature Granulocytes: 0 %
Lymphocytes Relative: 30 %
Lymphs Abs: 2 10*3/uL (ref 0.7–4.0)
MCH: 26.4 pg (ref 26.0–34.0)
MCHC: 31.1 g/dL (ref 30.0–36.0)
MCV: 84.8 fL (ref 80.0–100.0)
Monocytes Absolute: 0.6 10*3/uL (ref 0.1–1.0)
Monocytes Relative: 8 %
Neutro Abs: 3.9 10*3/uL (ref 1.7–7.7)
Neutrophils Relative %: 58 %
Platelets: 306 10*3/uL (ref 150–400)
RBC: 4.09 MIL/uL — ABNORMAL LOW (ref 4.22–5.81)
RDW: 14.3 % (ref 11.5–15.5)
WBC: 6.8 10*3/uL (ref 4.0–10.5)
nRBC: 0 % (ref 0.0–0.2)

## 2022-05-28 LAB — BASIC METABOLIC PANEL
Anion gap: 10 (ref 5–15)
BUN: 18 mg/dL (ref 6–20)
CO2: 23 mmol/L (ref 22–32)
Calcium: 8.8 mg/dL — ABNORMAL LOW (ref 8.9–10.3)
Chloride: 105 mmol/L (ref 98–111)
Creatinine, Ser: 1.17 mg/dL (ref 0.61–1.24)
GFR, Estimated: >60 mL/min (ref >60)
Glucose, Bld: 180 mg/dL — ABNORMAL HIGH (ref 70–99)
Potassium: 4.3 mmol/L (ref 3.5–5.1)
Sodium: 138 mmol/L (ref 135–145)

## 2022-05-28 MED ORDER — CEFAZOLIN SODIUM-DEXTROSE 2-4 GM/100ML-% IV SOLN
2.0000 g | Freq: Once | INTRAVENOUS | Status: AC
  Administered 2022-05-28: 2 g via INTRAVENOUS
  Filled 2022-05-28: qty 100

## 2022-06-01 ENCOUNTER — Telehealth: Payer: Self-pay | Admitting: Orthopedic Surgery

## 2022-06-01 ENCOUNTER — Ambulatory Visit (INDEPENDENT_AMBULATORY_CARE_PROVIDER_SITE_OTHER): Payer: Self-pay | Admitting: Orthopedic Surgery

## 2022-06-01 DIAGNOSIS — L97424 Non-pressure chronic ulcer of left heel and midfoot with necrosis of bone: Secondary | ICD-10-CM

## 2022-06-01 DIAGNOSIS — E11621 Type 2 diabetes mellitus with foot ulcer: Secondary | ICD-10-CM

## 2022-06-01 MED ORDER — SILVER SULFADIAZINE 1 % EX CREA: 1.0000 | TOPICAL_CREAM | Freq: Every day | CUTANEOUS | 3 refills | Status: DC

## 2022-06-01 MED ORDER — SILVER SULFADIAZINE 1 % EX CREA
1.0000 | TOPICAL_CREAM | Freq: Every day | CUTANEOUS | 3 refills | Status: DC
Start: 2022-06-01 — End: 2022-06-03

## 2022-06-01 MED ORDER — OXYCODONE HCL 5 MG PO TABS
5.0000 mg | ORAL_TABLET | ORAL | 0 refills | Status: DC | PRN
Start: 2022-06-01 — End: 2022-06-22

## 2022-06-01 MED ORDER — OXYCODONE HCL 5 MG PO TABS: 5.0000 mg | ORAL_TABLET | ORAL | 0 refills | Status: DC | PRN

## 2022-06-01 NOTE — Telephone Encounter (Signed)
Pt asked all meds go to [facility] on [address].

## 2022-06-01 NOTE — Telephone Encounter (Signed)
Patient called asked if his Rx  (Cream for his foot and Oxycodone) can be sent to [facility] on [address].   Patient said the Rx was to expensive at [facility] and less expensive at [facility]. The number to contact patient is [phone]

## 2022-06-01 NOTE — Telephone Encounter (Signed)
Noted updated pharmacy

## 2022-06-01 NOTE — Progress Notes (Signed)
Office Visit Note   Patient: [name]           Date of Birth: May 15, 1966           MRN: [id number] Visit Date: 06/01/2022              Requested by: No referring provider defined for this encounter. PCP: Patient, No Pcp Per  Chief Complaint  Patient presents with   Left Foot - Routine Post Op    05/05/2022 left foot debridement       HPI: Patient is a 56 year old gentleman who was seen in follow-up for necrotic ulceration of the plantar aspect of his left foot.  Patient is status post complete debridement of the soft tissue muscle and fascia on the plantar aspect of his foot with exposed metatarsal heads.  Assessment & Plan: Visit Diagnoses:  1. Diabetic ulcer of left midfoot associated with type 2 diabetes mellitus, with necrosis of bone (HCC)     Plan: Patient states that his insurance will be active on June 03, 2022.  He states that Dr. Lady Gary at the wound center would like to try foot salvage intervention after his insurance is active.  Patient is currently on a IV antibiotics through a PICC line.  I discussed that I do not feel that tissue grafting on the plantar aspect of his foot would be durable for ambulation.  Patient was provided a prescription for Percocet and Silvadene.  Follow-Up Instructions: Return in about 3 weeks (around 06/22/2022).   Ortho Exam  Patient is alert, oriented, no adenopathy, well-dressed, normal affect, normal respiratory effort. Examination patient has dehydrated and ischemic exposed metatarsal heads.  Clinically there is osteomyelitis of the metatarsal heads.  There is hypergranulation tissue with a wound involving essentially the entire plantar aspect of his foot.  There is deep tunneling through the midfoot.  Patient's last hemoglobin A1c was 11.5 and before that 12.  Imaging: No results found.    Labs: Lab Results  Component Value Date   HGBA1C 11.5 (H) 05/07/2022   HGBA1C 12.0 (H) 12/14/2021   ESRSEDRATE 107 (H) 05/06/2022    ESRSEDRATE 65 (H) 05/04/2022   ESRSEDRATE 25 (H) 12/14/2021   CRP 26.4 (H) 05/06/2022   CRP 27.3 (H) 05/04/2022   CRP 5.1 (H) 12/14/2021   REPTSTATUS 05/10/2022 FINAL 05/05/2022   GRAMSTAIN  05/05/2022    RARE WBC PRESENT,BOTH PMN AND MONONUCLEAR ABUNDANT GRAM POSITIVE COCCI ABUNDANT GRAM NEGATIVE RODS    CULT  05/05/2022    FEW STREPTOCOCCUS GALLOLYTICUS FEW STAPHYLOCOCCUS AUREUS FEW STREPTOCOCCUS GORDONII FEW PREVOTELLA SPECIES BETA LACTAMASE POSITIVE Performed at [facility], [address], [place], [place] [address]    [facility] STAPHYLOCOCCUS AUREUS 05/05/2022   LABORGA STREPTOCOCCUS GORDONII 05/05/2022   LABORGA STREPTOCOCCUS GALLOLYTICUS 05/05/2022     Lab Results  Component Value Date   ALBUMIN 2.2 (L) 05/09/2022   ALBUMIN 2.1 (L) 05/08/2022   ALBUMIN 2.7 (L) 05/07/2022    Lab Results  Component Value Date   MG 1.9 05/09/2022   MG 1.9 05/08/2022   MG 2.3 05/07/2022   No results found for: "VD25OH"  No results found for: "PREALBUMIN"    Latest Ref Rng & Units 05/28/2022    4:43 PM 05/11/2022    4:46 AM 05/09/2022    1:33 AM  CBC EXTENDED  WBC 4.0 - 10.5 K/uL 6.8  9.2  11.8   RBC 4.22 - 5.81 MIL/uL 4.09  3.82  3.68   Hemoglobin 13.0 - 17.0  g/dL 86.7  67.2  9.8   HCT 09.4 - 52.0 % 34.7  32.1  30.2   Platelets 150 - 400 K/uL 306  412  340   NEUT# 1.7 - 7.7 K/uL 3.9  5.8  7.9   Lymph# 0.7 - 4.0 K/uL 2.0  2.1  2.5      There is no height or weight on file to calculate BMI.  Orders:  No orders of the defined types were placed in this encounter.  Meds ordered this encounter  Medications   DISCONTD: oxyCODONE (OXY IR/ROXICODONE) 5 MG immediate release tablet    Sig: Take 1 tablet (5 mg total) by mouth every 4 (four) hours as needed for moderate pain (pain score 4-6).    Dispense:  15 tablet    Refill:  0   DISCONTD: silver sulfADIAZINE (SILVADENE) 1 % cream    Sig: Apply 1 Application topically daily. Apply to affected area daily plus dry dressing     Dispense:  400 g    Refill:  3   oxyCODONE (OXY IR/ROXICODONE) 5 MG immediate release tablet    Sig: Take 1 tablet (5 mg total) by mouth every 4 (four) hours as needed for moderate pain (pain score 4-6).    Dispense:  15 tablet    Refill:  0   silver sulfADIAZINE (SILVADENE) 1 % cream    Sig: Apply 1 Application topically daily. Apply to affected area daily plus dry dressing    Dispense:  400 g    Refill:  3     Procedures: No procedures performed  Clinical Data: No additional findings.  ROS:  All other systems negative, except as noted in the HPI. Review of Systems  Objective: Vital Signs: There were no vitals taken for this visit.  Specialty Comments:  No specialty comments available.  PMFS History: Patient Active Problem List   Diagnosis Date Noted   Pyogenic inflammation of bone (HCC) 06/02/2022   PICC (peripherally inserted central catheter) in place 06/02/2022   Medication management 06/02/2022   Diabetic foot infection (HCC) 06/02/2022   Smoking 06/02/2022   Tobacco abuse 05/06/2022   Hyperkalemia 05/06/2022   Type 2 diabetes mellitus (HCC) 05/05/2022   Obesity (BMI 30-39.9) 05/05/2022   Cellulitis and abscess of foot    AKI (acute kidney injury) (HCC) 05/04/2022   Injury of left foot    Diabetic foot ulcer (HCC) 12/14/2021   Past Medical History:  Diagnosis Date   Diabetes mellitus without complication (HCC)     History reviewed. No pertinent family history.  Past Surgical History:  Procedure Laterality Date   I & D EXTREMITY Left 05/05/2022   Procedure: LEFT FOOT DEBRIDEMENT;  Surgeon: Nadara Mustard, MD;  Location: [facility] OR;  Service: Orthopedics;  Laterality: Left;   TONSILLECTOMY     Social History   Occupational History   Not on file  Tobacco Use   Smoking status: Every Day    Packs/day: 0.25    Types: Cigarettes   Smokeless tobacco: Never   Tobacco comments:    Cutting back - goes through a pack in 4-6 days  Vaping Use   Vaping Use:  Never used  Substance and Sexual Activity   Alcohol use: No   Drug use: No   Sexual activity: Yes

## 2022-06-01 NOTE — Telephone Encounter (Signed)
Please see below.

## 2022-06-02 ENCOUNTER — Other Ambulatory Visit: Payer: Self-pay

## 2022-06-02 ENCOUNTER — Ambulatory Visit (INDEPENDENT_AMBULATORY_CARE_PROVIDER_SITE_OTHER): Payer: Self-pay | Admitting: Infectious Diseases

## 2022-06-02 ENCOUNTER — Encounter: Payer: Self-pay | Admitting: Orthopedic Surgery

## 2022-06-02 ENCOUNTER — Encounter: Payer: Self-pay | Admitting: Infectious Diseases

## 2022-06-02 ENCOUNTER — Telehealth: Payer: Self-pay

## 2022-06-02 VITALS — BP 108/72 | HR 68 | Temp 98.2°F | Ht 70.0 in | Wt 230.0 lb

## 2022-06-02 DIAGNOSIS — L089 Local infection of the skin and subcutaneous tissue, unspecified: Secondary | ICD-10-CM | POA: Insufficient documentation

## 2022-06-02 DIAGNOSIS — E11628 Type 2 diabetes mellitus with other skin complications: Secondary | ICD-10-CM

## 2022-06-02 DIAGNOSIS — F17200 Nicotine dependence, unspecified, uncomplicated: Secondary | ICD-10-CM | POA: Insufficient documentation

## 2022-06-02 DIAGNOSIS — Z79899 Other long term (current) drug therapy: Secondary | ICD-10-CM | POA: Insufficient documentation

## 2022-06-02 DIAGNOSIS — Z452 Encounter for adjustment and management of vascular access device: Secondary | ICD-10-CM | POA: Insufficient documentation

## 2022-06-02 DIAGNOSIS — M869 Osteomyelitis, unspecified: Secondary | ICD-10-CM | POA: Insufficient documentation

## 2022-06-02 DIAGNOSIS — M86172 Other acute osteomyelitis, left ankle and foot: Secondary | ICD-10-CM

## 2022-06-02 DIAGNOSIS — M8610 Other acute osteomyelitis, unspecified site: Secondary | ICD-10-CM | POA: Insufficient documentation

## 2022-06-02 NOTE — Telephone Encounter (Signed)
Called pt to tell him Rx's being sent, call rang and disconnected.

## 2022-06-02 NOTE — Telephone Encounter (Signed)
Per verbal order from Dr. Elinor Parkinson, please extend patient's IV antibiotics until 06/20/22. Patient has a follow-up appointment with Dr. Thedore Mins on that day for assessment.  Community message sent to [facility]. Routed to [facility] clinical pharmacists.  Wyvonne Lenz, RN

## 2022-06-02 NOTE — Progress Notes (Incomplete)
Patient Active Problem List   Diagnosis Date Noted   Tobacco abuse 05/06/2022   Hyperkalemia 05/06/2022   Type 2 diabetes mellitus ([place]) 05/05/2022   Obesity (BMI 30-39.9) 05/05/2022   Cellulitis and abscess of foot    AKI (acute kidney injury) ([place]) 05/04/2022   Injury of left foot    Diabetic foot ulcer ([place]) 12/14/2021   Current Outpatient Medications on File Prior to Visit  Medication Sig Dispense Refill   acetaminophen (TYLENOL) 500 MG tablet Take 2,000 mg by mouth every 6 (six) hours as needed for mild pain or moderate pain.     ceFAZolin (ANCEF) IVPB Inject 2 g into the vein every 8 (eight) hours. Indication:  L-DFI/osteo First Dose: Yes Last Day of Therapy:  06/16/22 Labs - Once weekly:  CBC/D and BMP, Labs - Every other week:  ESR and CRP Method of administration: IV Push Method of administration may be changed at the discretion of home infusion pharmacist based upon assessment of the patient and/or caregiver's ability to self-administer the medication ordered. 111 Units 0   insulin NPH-regular Human (70-30) 100 UNIT/ML injection Inject 30 Units into the skin 2 (two) times daily with a meal. 18 mL 1   Insulin Pen Needle 32G X 4 MM MISC Use as directed to inject insulin up to 4 times daily. 100 each 0   Insulin Pen Needle 32G X 4 MM MISC Use to inject insulin up to 4 times daily as directed. 100 each 0   levofloxacin (LEVAQUIN) 750 MG tablet Take 1 tablet (750 mg total) by mouth daily. 42 tablet 0   metFORMIN (GLUCOPHAGE) 500 MG tablet Take 1 tablet (500 mg total) by mouth 2 (two) times daily. 60 tablet 1   metroNIDAZOLE (FLAGYL) 500 MG tablet Take 1 tablet (500 mg total) by mouth 2 (two) times daily. Stop date 06/16/22 when IV antibiotics are completed 74 tablet 0   oxyCODONE (OXY IR/ROXICODONE) 5 MG immediate release tablet Take 1 tablet (5 mg total) by mouth every 4 (four) hours as needed for moderate pain (pain score 4-6). 15 tablet 0   Sodium Chloride Flush (SALINE  FLUSH) 0.9 % SOLN Inject 10 mLs into the vein in the morning, at noon, and at bedtime. 100 mL 0   temazepam (RESTORIL) 7.5 MG capsule Take 1 capsule (7.5 mg total) by mouth at bedtime as needed for sleep. 30 capsule 0   No current facility-administered medications on file prior to visit.   Subjective: 56 Y O male with h/o DM, smoking who is here for HFU for left foot Osteomyelitis. Patient was recently admitted 6/1-6/8 for worsening left sided foot pain and purulent drainage with foul smell. Xray and MRI left foot 6/1 with following findings. Wound cx 6/1 with MRSA, strep gallolyticus, klebsiella oxytoca. S/p left foot debridement 6/2 with operative cx growing MSSA, strep gallolyticus, strep gordonii, Klebsiella oxytoca, Prevotella spp ( B lactamase +). Patient refused amputation recommended by Dr Sharol Given. Seen by ID and antibitics adjusted to cefazolin, levofloxacin and metronidazole for 6 weeks. MRSA coverage was not added as it only grew in wound cultures and NOT in OR cx.   Seen by Dr Candiss Norse 6/20 in clinic follow up. Blood cx no growth, MR rt foot ordered due to continued purulence ( pending). Seen by wound care 6/23 " The wound measures a little bit smaller today. There is some good granulation tissue beginning to form on the surface. He still has exposed bone and tendon.  There is some slough accumulation". Seen in the ED 6/25 PICC line replacement for accidental PICC line removal. Saw Dr Sharol Given 6/29, he states that the wound care doctor and Dr Sharol Given thinks there is no worsening of wound and he would like to continue IV abtx at this time, not considering amputation. Tells me Dr Sharol Given has stretched fu visit from 1 week to 3 weeks as wound looks good.   Complains of loose stools 2-3 times a day, soft. No blood. Denies fevers, chills and sweats. Denies nausea, vomiting and diarrhea. Denies any issues with PICC. Smoking from 1 pack a day to 6 cigarettes a day.   Review of Systems: all systems reviewed with  pertinent positives and negatives as listed above   Past Medical History:  Diagnosis Date   Diabetes mellitus without complication [facility])    Past Surgical History:  Procedure Laterality Date   I & D EXTREMITY Left 05/05/2022   Procedure: LEFT FOOT DEBRIDEMENT;  Surgeon: Newt Minion, MD;  Location: [facility];  Service: Orthopedics;  Laterality: Left;   TONSILLECTOMY      Social History   Tobacco Use   Smoking status: Every Day    Packs/day: 0.25    Types: Cigarettes   Smokeless tobacco: Never   Tobacco comments:    Cutting back - goes through a pack in 4-6 days  Vaping Use   Vaping Use: Never used  Substance Use Topics   Alcohol use: No   Drug use: No    No family history on file.  No Known Allergies  Health Maintenance  Topic Date Due   COVID-19 Vaccine (1) Never done   FOOT EXAM  Never done   OPHTHALMOLOGY EXAM  Never done   URINE MICROALBUMIN  Never done   Hepatitis C Screening  Never done   COLONOSCOPY (Pts 45-37yr Insurance coverage will need to be confirmed)  Never done   Zoster Vaccines- Shingrix (1 of 2) Never done   INFLUENZA VACCINE  07/04/2022   HEMOGLOBIN A1C  11/06/2022   TETANUS/TDAP  06/01/2024   HIV Screening  Completed   HPV VACCINES  Aged Out    Objective:  Vitals:   06/02/22 1430  BP: 108/72  Pulse: 68  Temp: 98.2 F (36.8 C)  TempSrc: Oral  SpO2: 98%  Weight: 230 lb (104.3 kg)  Height: 5' 10"  (1.778 m)   Body mass index is 33 kg/m.  Physical Exam Constitutional:      Appearance: Normal appearance.  HENT:     Head: Normocephalic and atraumatic.      Mouth: Mucous membranes are moist.  Eyes:    Conjunctiva/sclera: Conjunctivae normal.     Pupils:  Cardiovascular:     Rate and Rhythm: Normal rate and regular rhythm.     Heart sounds:  Pulmonary:     Effort: Pulmonary effort is normal.     Breath sounds: Normal breath sounds.   Abdominal:     General: Non distended     Palpations: soft.   Musculoskeletal:         General: Normal range of motion.  Left foot    Skin:    General: Skin is warm and dry.     Comments:  Neurological:     General: grossly non focal     Mental Status: awake, alert and oriented to person, place, and time.   Psychiatric:        Mood and Affect: Mood normal.   Lab Results Lab Results  Component Value Date   WBC 6.8 05/28/2022   HGB 10.8 (L) 05/28/2022   HCT 34.7 (L) 05/28/2022   MCV 84.8 05/28/2022   PLT 306 05/28/2022    Lab Results  Component Value Date   CREATININE 1.17 05/28/2022   BUN 18 05/28/2022   NA 138 05/28/2022   K 4.3 05/28/2022   CL 105 05/28/2022   CO2 23 05/28/2022    Lab Results  Component Value Date   ALT 13 05/09/2022   AST 17 05/09/2022   ALKPHOS 59 05/09/2022   BILITOT 0.5 05/09/2022    Lab Results  Component Value Date   CHOL 101 12/14/2021   HDL 29 (L) 12/14/2021   LDLCALC 45 12/14/2021   TRIG 134 12/14/2021   CHOLHDL 3.5 12/14/2021   No results found for: "LABRPR", "RPRTITER" No results found for: "HIV1RNAQUANT", "HIV1RNAVL", "CD4TABS"  Microbiology Results for orders placed or performed in visit on 05/23/22  Culture, blood (single) w Reflex to ID Panel     Status: None   Collection Time: 05/23/22 10:50 AM   Specimen: Vein; Blood  Result Value Ref Range Status   MICRO NUMBER: [id number]  Final   SPECIMEN QUALITY: Adequate  Final   Source BLOOD 1  Final   STATUS: FINAL  Final   Result: No growth after 5 days  Final   COMMENT: Aerobic and anaerobic bottle received.  Final  Culture, blood (single) w Reflex to ID Panel     Status: None   Collection Time: 05/23/22 10:53 AM   Specimen: Vein; Blood  Result Value Ref Range Status   MICRO NUMBER: [id number]  Final   SPECIMEN QUALITY: Adequate  Final   Source BLOOD 2  Final   STATUS: FINAL  Final   Result: No growth after 5 days  Final   COMMENT: Aerobic and anaerobic bottle received.  Final   Imaging [place] EKG SITE RITE  Result Date: 05/28/2022 If Site Rite image not  attached, placement could not be confirmed due to current cardiac rhythm.  [place] EKG SITE RITE  Result Date: 05/10/2022 If Site Rite image not attached, placement could not be confirmed due to current cardiac rhythm.  US RENAL  Result Date: 05/06/2022 CLINICAL DATA:  Diabetes mellitus EXAM: RENAL / URINARY TRACT ULTRASOUND COMPLETE COMPARISON:  None Available. FINDINGS: Right Kidney: Renal measurements: 11.4 x 5.8 x 6.4 cm = volume: 221.34 mL. Echogenicity within normal limits. No mass or hydronephrosis visualized. Left Kidney: Renal measurements: 11.6 x 5.6 x 6.1 cm = volume: 206.31 mL. Echogenicity within normal limits. No mass or hydronephrosis visualized. Bladder: Nonspecific slight thickening of the bladder wall, possibly due to under-distension. Other: None. IMPRESSION: Normal sonographic appearance of the kidneys.  No hydronephrosis. Nonspecific slight thickening of the bladder wall, possibly due to under-distension. Could correlate with urinalysis. Electronically Signed   By: Maurine Simmering M.D.   On: 05/06/2022 11:50   MR FOOT LEFT W WO CONTRAST  Result Date: 05/05/2022 CLINICAL DATA:  Diabetic foot swelling.  Ulcer. EXAM: MRI OF THE LEFT FOOT WITHOUT AND WITH CONTRAST TECHNIQUE: Multiplanar, multisequence MR imaging of the left foot was performed both before and after administration of intravenous contrast. Osteomyelitis protocol MRI of the foot was obtained, to include the entire foot and ankle. This protocol uses a large field of view to cover the entire foot and ankle, and is suitable for assessing bony structures for osteomyelitis. Due to the large field of view and imaging plane choice, this protocol is less sensitive  for assessing small structures such as ligamentous structures of the foot and ankle, compared to a dedicated forefoot or dedicated hindfoot exam. CONTRAST:  28m GADAVIST GADOBUTROL 1 MMOL/ML IV SOLN COMPARISON:  12/14/2021 and radiographs 05/04/2022 FINDINGS: Osteomyelitis protocol  MRI of the foot was obtained, to include the entire foot and ankle. This protocol uses a large field of view to cover the entire foot and ankle, and is suitable for assessing bony structures for osteomyelitis. Due to the large field of view and imaging plane choice, this protocol is less sensitive for assessing small structures such as ligamentous structures of the foot and ankle, compared to a dedicated forefoot or dedicated hindfoot exam. Bones/Joint/Cartilage Subtle accentuated edema and enhancement along the plantar side of the fifth metatarsal, nonspecific for arthropathy versus early osteomyelitis. Ligaments Lisfranc ligament intact. Muscles and Tendons Diffuse low-level edema in the regional musculature, possibly neurogenic. Abnormal gas tracks along the plantar margin of the flexor digitorum brevis muscle as shown on image 45 series 16, compatible with infection and myositis. Soft tissues Extensive subcutaneous gas along the plantar foot especially medially as on image 34 series 16. This tracks from about the level of the Chopart joint to the base of the first through fourth toes. Small amount of artifact associated with the ulceration along the plantar base of the fifth metatarsal with localized edema and infiltration of tissue in this vicinity. The most part the gas appears multilocular rather than confluent in a single cavity, and no well-defined drainable abscess is observed. Dorsal subcutaneous edema and enhancement noted in the foot compatible with cellulitis. IMPRESSION: 1. Nonspecific low-grade edema and enhancement along the plantar side of the head of the fifth metatarsal. This is nonspecific for arthropathy versus early osteomyelitis although the proximity to ulceration and localized subcutaneous infiltration tends to favor early osteomyelitis. 2. Extensive gas in the plantar soft tissues, primarily in the subcutaneous tissues extending from about the level of the Chopart joint through the ball of  the foot, but also with some extension to involve the plantar portion of the flexor digitorum brevis muscle, indicative of myositis and gas producing soft tissue infection. Electronically Signed   By: WVan ClinesM.D.   On: 05/05/2022 07:47   DG Foot Complete Left  Result Date: 05/04/2022 CLINICAL DATA:  Pain in lateral foot EXAM: LEFT FOOT - COMPLETE 3+ VIEW COMPARISON:  12/14/2021 FINDINGS: Ulceration noted in the lateral forefoot. There are 2 small hyperdensities in the region of ulceration which may be foreign body or topical medication. Extensive subcutaneous emphysema of the palmar 4 and midfoot. Enthesopathic changes are seen at the insertion of the plantar fascia and Achilles tendon on the calcaneus. IMPRESSION: Lateral forefoot soft tissue ulceration containing small hyperdensities which may be foreign body or medication. Please correlate with examination. Extensive subcutaneous emphysema of the plantar mid and forefoot. No osseous erosions are seen to indicate osteomyelitis. Electronically Signed   By: FMiachel RouxM.D.   On: 05/04/2022 14:09    Vascular ABI 12/15/2021 Summary:  Right: Resting right ankle-brachial index is within normal range. No  evidence of significant right lower extremity arterial disease. The right  toe-brachial index is normal.   Left: Resting left ankle-brachial index is within normal range. No  evidence of significant left lower extremity arterial disease. The left  toe-brachial index is normal.   Problem List Items Addressed This Visit       Endocrine   Diabetic foot infection ([place]     Musculoskeletal and Integument  Pyogenic inflammation of bone ([facility]) - Primary     Other   PICC (peripherally inserted central catheter) in place   Medication management   Smoking     Assessment/Plan  # DFU Left mid foot # Left 5th metatarsal osteomyelitis S/p debridement 6/1 with OR tissue cultures growing Klebsiella oxytoca, MSSA, strep gordonii, strep  gallolyticus, Prevotella species beta-lactamase positive. Wound cx 6/1 also has MRSA  Continue cefazolin/levofloxacin ( qtc 441 in jan 2023) and metronidazole. Wound cx with MRSA. OR cx with MSSA. MRSA coverage was deemed not needed given it was isolated from wound cx only ( not OR cx) Will not plan to cover for MRSA currently given no worsening exam noted per wound care/Ortho and patient report without MRSA coverage  Fu MRI left foot  Fu with Dr Candiss Norse 7/18 arranged. EOT 7/18, OPAT updated  Fu with wound care and Orthopedics as instructed   # Medication Monitoring 6/30 hb 10.3, ESR 42, CRP 5   # PICC  No issues   # Smoking  - He is cutting down, encouraged   OPAT  Diagnosis: DFU/osteomyelitis   Culture Result: Polymicrobial   No Known Allergies  OPAT Orders Discharge antibiotics to be given via PICC line Discharge antibiotics: cefazolin 2g iv q8 hrs  Per pharmacy protocol  Duration: 6+ weeks  End Date: 06/20/22  [facility] Care Per Protocol:  Home health RN for IV administration and teaching; PICC line care and labs.    Labs weekly while on IV antibiotics: X__ CBC with differential X__ BMP __ CMP X__ CRP and ESR every 2 weeks X __ Vancomycin trough __ CK  __ Please pull PIC at completion of IV antibiotics __ Please leave PIC in place until doctor has seen patient or been notified  Fax weekly labs to [phone]  Clinic Follow Up Appt: 05/21/22 Dr Candiss Norse    I have personally spent more than 70 minutes involved in face-to-face and non-face-to-face activities for this patient on the day of the visit. Professional time spent includes the following activities: Preparing to see the patient (review of tests), Obtaining and/or reviewing separately obtained history (admission/discharge record), Performing a medically appropriate examination and/or evaluation , Ordering medications/tests/procedures, referring and communicating with other health care professionals, Documenting  clinical information in the EMR, Independently interpreting results (not separately reported), Communicating results to the patient/family/caregiver, Counseling and educating the patient/family/caregiver and Care coordination (not separately reported).   Wilber Oliphant, [place] [facility] 06/02/2022, 2:51 PM

## 2022-06-02 NOTE — Telephone Encounter (Signed)
Thank you Erin.

## 2022-06-03 ENCOUNTER — Ambulatory Visit (HOSPITAL_COMMUNITY): Payer: 59

## 2022-06-05 ENCOUNTER — Ambulatory Visit: Payer: 59 | Attending: Family Medicine | Admitting: Family Medicine

## 2022-06-05 ENCOUNTER — Encounter: Payer: Self-pay | Admitting: Family Medicine

## 2022-06-05 ENCOUNTER — Encounter (HOSPITAL_BASED_OUTPATIENT_CLINIC_OR_DEPARTMENT_OTHER): Payer: 59 | Attending: General Surgery | Admitting: General Surgery

## 2022-06-05 ENCOUNTER — Other Ambulatory Visit (HOSPITAL_COMMUNITY): Payer: Self-pay | Admitting: General Surgery

## 2022-06-05 ENCOUNTER — Telehealth: Payer: Self-pay | Admitting: Family Medicine

## 2022-06-05 ENCOUNTER — Other Ambulatory Visit: Payer: Self-pay | Admitting: Pharmacist

## 2022-06-05 VITALS — BP 130/78 | HR 85 | Temp 98.2°F | Ht 70.0 in | Wt 234.4 lb

## 2022-06-05 DIAGNOSIS — Z794 Long term (current) use of insulin: Secondary | ICD-10-CM

## 2022-06-05 DIAGNOSIS — E08621 Diabetes mellitus due to underlying condition with foot ulcer: Secondary | ICD-10-CM

## 2022-06-05 DIAGNOSIS — E1169 Type 2 diabetes mellitus with other specified complication: Secondary | ICD-10-CM

## 2022-06-05 DIAGNOSIS — L97524 Non-pressure chronic ulcer of other part of left foot with necrosis of bone: Secondary | ICD-10-CM | POA: Insufficient documentation

## 2022-06-05 DIAGNOSIS — Z9189 Other specified personal risk factors, not elsewhere classified: Secondary | ICD-10-CM | POA: Diagnosis not present

## 2022-06-05 DIAGNOSIS — E11621 Type 2 diabetes mellitus with foot ulcer: Secondary | ICD-10-CM | POA: Diagnosis not present

## 2022-06-05 DIAGNOSIS — M86172 Other acute osteomyelitis, left ankle and foot: Secondary | ICD-10-CM | POA: Diagnosis not present

## 2022-06-05 DIAGNOSIS — R69 Illness, unspecified: Secondary | ICD-10-CM | POA: Diagnosis not present

## 2022-06-05 DIAGNOSIS — F17200 Nicotine dependence, unspecified, uncomplicated: Secondary | ICD-10-CM | POA: Insufficient documentation

## 2022-06-05 DIAGNOSIS — Z9289 Personal history of other medical treatment: Secondary | ICD-10-CM

## 2022-06-05 DIAGNOSIS — L97522 Non-pressure chronic ulcer of other part of left foot with fat layer exposed: Secondary | ICD-10-CM | POA: Diagnosis not present

## 2022-06-05 DIAGNOSIS — Z1159 Encounter for screening for other viral diseases: Secondary | ICD-10-CM | POA: Diagnosis not present

## 2022-06-05 DIAGNOSIS — L97424 Non-pressure chronic ulcer of left heel and midfoot with necrosis of bone: Secondary | ICD-10-CM

## 2022-06-05 LAB — GLUCOSE, POCT (MANUAL RESULT ENTRY): POC Glucose: 161 mg/dl — AB (ref 70–99)

## 2022-06-05 MED ORDER — ATORVASTATIN CALCIUM 20 MG PO TABS: 20.0000 mg | ORAL_TABLET | Freq: Every day | ORAL | 1 refills | Status: DC

## 2022-06-05 MED ORDER — DEXCOM G6 RECEIVER DEVI: 0 refills | Status: DC

## 2022-06-05 MED ORDER — OZEMPIC (0.25 OR 0.5 MG/DOSE) 2 MG/1.5ML ~~LOC~~ SOPN: 0.2500 mg | PEN_INJECTOR | SUBCUTANEOUS | 6 refills | Status: DC

## 2022-06-05 MED ORDER — DEXCOM G6 TRANSMITTER MISC: 1 refills | Status: DC

## 2022-06-05 MED ORDER — INSULIN PEN NEEDLE 32G X 4 MM MISC: 0 refills | Status: DC

## 2022-06-05 MED ORDER — HUMULIN N KWIKPEN 100 UNIT/ML ~~LOC~~ SUPN: 20.0000 [IU] | PEN_INJECTOR | Freq: Two times a day (BID) | SUBCUTANEOUS | 6 refills | Status: DC

## 2022-06-05 MED ORDER — METFORMIN HCL 500 MG PO TABS: 500.0000 mg | ORAL_TABLET | Freq: Two times a day (BID) | ORAL | 1 refills | Status: DC

## 2022-06-05 MED ORDER — DEXCOM G6 SENSOR MISC: 2 refills | Status: DC

## 2022-06-05 NOTE — Progress Notes (Signed)
CBG 161 

## 2022-06-05 NOTE — Patient Instructions (Signed)
Semaglutide Injection What is this medication? SEMAGLUTIDE (SEM a GLOO tide) treats type 2 diabetes. It works by increasing insulin levels in your body, which decreases your blood sugar (glucose). It also reduces the amount of sugar released into the blood and slows down your digestion. It can also be used to lower the risk of heart attack and stroke in people with type 2 diabetes. Changes to diet and exercise are often combined with this medication. This medicine may be used for other purposes; ask your health care provider or pharmacist if you have questions. COMMON BRAND NAME(S): OZEMPIC What should I tell my care team before I take this medication? They need to know if you have any of these conditions: Endocrine tumors (MEN 2) or if someone in your family had these tumors Eye disease, vision problems History of pancreatitis Kidney disease Stomach problems Thyroid cancer or if someone in your family had thyroid cancer An unusual or allergic reaction to semaglutide, other medications, foods, dyes, or preservatives Pregnant or trying to get pregnant Breast-feeding How should I use this medication? This medication is for injection under the skin of your upper leg (thigh), stomach area, or upper arm. It is given once every week (every 7 days). You will be taught how to prepare and give this medication. Use exactly as directed. Take your medication at regular intervals. Do not take it more often than directed. If you use this medication with insulin, you should inject this medication and the insulin separately. Do not mix them together. Do not give the injections right next to each other. Change (rotate) injection sites with each injection. It is important that you put your used needles and syringes in a special sharps container. Do not put them in a trash can. If you do not have a sharps container, call your pharmacist or care team to get one. A special MedGuide will be given to you by the  pharmacist with each prescription and refill. Be sure to read this information carefully each time. This medication comes with INSTRUCTIONS FOR USE. Ask your pharmacist for directions on how to use this medication. Read the information carefully. Talk to your pharmacist or care team if you have questions. Talk to your care team about the use of this medication in children. Special care may be needed. Overdosage: If you think you have taken too much of this medicine contact a poison control center or emergency room at once. NOTE: This medicine is only for you. Do not share this medicine with others. What if I miss a dose? If you miss a dose, take it as soon as you can within 5 days after the missed dose. Then take your next dose at your regular weekly time. If it has been longer than 5 days after the missed dose, do not take the missed dose. Take the next dose at your regular time. Do not take double or extra doses. If you have questions about a missed dose, contact your care team for advice. What may interact with this medication? Other medications for diabetes Many medications may cause changes in blood sugar, these include: Alcohol containing beverages Antiviral medications for HIV or AIDS Aspirin and aspirin-like medications Certain medications for blood pressure, heart disease, irregular heart beat Chromium Diuretics Male hormones, such as estrogens or progestins, birth control pills Fenofibrate Gemfibrozil Isoniazid Lanreotide Male hormones or anabolic steroids MAOIs like Carbex, Eldepryl, Marplan, Nardil, and Parnate Medications for weight loss Medications for allergies, asthma, cold, or cough Medications for depression,   anxiety, or psychotic disturbances Niacin Nicotine NSAIDs, medications for pain and inflammation, like ibuprofen or naproxen Octreotide Pasireotide Pentamidine Phenytoin Probenecid Quinolone antibiotics such as ciprofloxacin, levofloxacin, ofloxacin Some  herbal dietary supplements Steroid medications such as prednisone or cortisone Sulfamethoxazole; trimethoprim Thyroid hormones Some medications can hide the warning symptoms of low blood sugar (hypoglycemia). You may need to monitor your blood sugar more closely if you are taking one of these medications. These include: Beta-blockers, often used for high blood pressure or heart problems (examples include atenolol, metoprolol, propranolol) Clonidine Guanethidine Reserpine This list may not describe all possible interactions. Give your health care provider a list of all the medicines, herbs, non-prescription drugs, or dietary supplements you use. Also tell them if you smoke, drink alcohol, or use illegal drugs. Some items may interact with your medicine. What should I watch for while using this medication? Visit your care team for regular checks on your progress. Drink plenty of fluids while taking this medication. Check with your care team if you get an attack of severe diarrhea, nausea, and vomiting. The loss of too much body fluid can make it dangerous for you to take this medication. A test called the HbA1C (A1C) will be monitored. This is a simple blood test. It measures your blood sugar control over the last 2 to 3 months. You will receive this test every 3 to 6 months. Learn how to check your blood sugar. Learn the symptoms of low and high blood sugar and how to manage them. Always carry a quick-source of sugar with you in case you have symptoms of low blood sugar. Examples include hard sugar candy or glucose tablets. Make sure others know that you can choke if you eat or drink when you develop serious symptoms of low blood sugar, such as seizures or unconsciousness. They must get medical help at once. Tell your care team if you have high blood sugar. You might need to change the dose of your medication. If you are sick or exercising more than usual, you might need to change the dose of your  medication. Do not skip meals. Ask your care team if you should avoid alcohol. Many nonprescription cough and cold products contain sugar or alcohol. These can affect blood sugar. Pens should never be shared. Even if the needle is changed, sharing may result in passing of viruses like hepatitis or HIV. Wear a medical ID bracelet or chain, and carry a card that describes your disease and details of your medication and dosage times. Do not become pregnant while taking this medication. Women should inform their care team if they wish to become pregnant or think they might be pregnant. There is a potential for serious side effects to an unborn child. Talk to your care team for more information. What side effects may I notice from receiving this medication? Side effects that you should report to your care team as soon as possible: Allergic reactions--skin rash, itching, hives, swelling of the face, lips, tongue, or throat Change in vision Dehydration--increased thirst, dry mouth, feeling faint or lightheaded, headache, dark yellow or brown urine Gallbladder problems--severe stomach pain, nausea, vomiting, fever Heart palpitations--rapid, pounding, or irregular heartbeat Kidney injury--decrease in the amount of urine, swelling of the ankles, hands, or feet Pancreatitis--severe stomach pain that spreads to your back or gets worse after eating or when touched, fever, nausea, vomiting Thyroid cancer--new mass or lump in the neck, pain or trouble swallowing, trouble breathing, hoarseness Side effects that usually do not require medical   attention (report to your care team if they continue or are bothersome): Diarrhea Loss of appetite Nausea Stomach pain Vomiting This list may not describe all possible side effects. Call your doctor for medical advice about side effects. You may report side effects to FDA at 1-800-FDA-1088. Where should I keep my medication? Keep out of the reach of children. Store  unopened pens in a refrigerator between 2 and 8 degrees C (36 and 46 degrees F). Do not freeze. Protect from light and heat. After you first use the pen, it can be stored for 56 days at room temperature between 15 and 30 degrees C (59 and 86 degrees F) or in a refrigerator. Throw away your used pen after 56 days or after the expiration date, whichever comes first. Do not store your pen with the needle attached. If the needle is left on, medication may leak from the pen. NOTE: This sheet is a summary. It may not cover all possible information. If you have questions about this medicine, talk to your doctor, pharmacist, or health care provider.  2023 Elsevier/Gold Standard (2021-02-24 00:00:00)  

## 2022-06-05 NOTE — Telephone Encounter (Signed)
Can you please assist with CGM prescription?  Thank you.

## 2022-06-05 NOTE — Progress Notes (Signed)
Subjective:  Patient ID: [name], male    DOB: [birth year]  Age: 56 y.o. MRN: [id number]  CC: Hospitalization Follow-up   HPI [name] is a 56 y.o. year old male with a history of type 2 diabetes mellitus (A1c 11.5), osteomyelitis of left foot, left foot diabetic ulcer (status post surgical debridement), wound culture positive for MRSA here to establish care. He was last seen by PCP in [place] 2 years ago.  Interval History: He had a visit with [facility] 4 days ago and has also been seen by wound care and orthopedic last week.  No plans for amputation at this time. Notes from specialist reviewed.  He is currently on cefazolin via PICC line. He sees wound care later today.  Blood sugars at home have been around 170-205 fasting. Highest blood sugars are around 230. He administers 20 units of NPH bid Past Medical History:  Diagnosis Date   Diabetes mellitus without complication [facility])     Past Surgical History:  Procedure Laterality Date   I & D EXTREMITY Left 05/05/2022   Procedure: LEFT FOOT DEBRIDEMENT;  Surgeon: Newt Minion, MD;  Location: [facility];  Service: Orthopedics;  Laterality: Left;   TONSILLECTOMY      History reviewed. No pertinent family history.  Social History   Socioeconomic History   Marital status: Single    Spouse name: Not on file   Number of children: Not on file   Years of education: Not on file   Highest education level: Not on file  Occupational History   Not on file  Tobacco Use   Smoking status: Every Day    Packs/day: 0.25    Types: Cigarettes   Smokeless tobacco: Never   Tobacco comments:    Cutting back - goes through a pack in 4-6 days  Vaping Use   Vaping Use: Never used  Substance and Sexual Activity   Alcohol use: No   Drug use: No   Sexual activity: Yes  Other Topics Concern   Not on file  Social History Narrative   Not on file   Social Determinants of Health   Financial Resource Strain: Not on file  Food Insecurity: Not  on file  Transportation Needs: Not on file  Physical Activity: Not on file  Stress: Not on file  Social Connections: Not on file    No Known Allergies  Outpatient Medications Prior to Visit  Medication Sig Dispense Refill   acetaminophen (TYLENOL) 500 MG tablet Take 2,000 mg by mouth every 6 (six) hours as needed for mild pain or moderate pain.     ceFAZolin (ANCEF) IVPB Inject 2 g into the vein every 8 (eight) hours. Indication:  L-DFI/osteo First Dose: Yes Last Day of Therapy:  06/16/22 Labs - Once weekly:  CBC/D and BMP, Labs - Every other week:  ESR and CRP Method of administration: IV Push Method of administration may be changed at the discretion of home infusion pharmacist based upon assessment of the patient and/or caregiver's ability to self-administer the medication ordered. 111 Units 0   Insulin Pen Needle 32G X 4 MM MISC Use as directed to inject insulin up to 4 times daily. 100 each 0   levofloxacin (LEVAQUIN) 750 MG tablet Take 1 tablet (750 mg total) by mouth daily. 42 tablet 0   metroNIDAZOLE (FLAGYL) 500 MG tablet Take 1 tablet (500 mg total) by mouth 2 (two) times daily. Stop date 06/16/22 when IV antibiotics are completed 74 tablet 0  oxyCODONE (OXY IR/ROXICODONE) 5 MG immediate release tablet Take 1 tablet (5 mg total) by mouth every 4 (four) hours as needed for moderate pain (pain score 4-6). 15 tablet 0   Sodium Chloride Flush (SALINE FLUSH) 0.9 % SOLN Inject 10 mLs into the vein in the morning, at noon, and at bedtime. 100 mL 0   temazepam (RESTORIL) 7.5 MG capsule Take 1 capsule (7.5 mg total) by mouth at bedtime as needed for sleep. 30 capsule 0   insulin NPH-regular Human (70-30) 100 UNIT/ML injection Inject 30 Units into the skin 2 (two) times daily with a meal. 18 mL 1   Insulin Pen Needle 32G X 4 MM MISC Use to inject insulin up to 4 times daily as directed. 100 each 0   metFORMIN (GLUCOPHAGE) 500 MG tablet Take 1 tablet (500 mg total) by mouth 2 (two) times  daily. 60 tablet 1   No facility-administered medications prior to visit.     ROS Review of Systems  Constitutional:  Negative for activity change and appetite change.  HENT:  Negative for sinus pressure and sore throat.   Respiratory:  Negative for chest tightness, shortness of breath and wheezing.   Cardiovascular:  Negative for chest pain and palpitations.  Gastrointestinal:  Negative for abdominal distention, abdominal pain and constipation.  Genitourinary: Negative.   Musculoskeletal: Negative.   Skin:  Positive for wound.  Psychiatric/Behavioral:  Negative for behavioral problems and dysphoric mood.     Objective:  BP 130/78   Pulse 85   Temp 98.2 F (36.8 C) (Oral)   Ht 5' 10"  (1.778 m)   Wt 234 lb 6.4 oz (106.3 kg)   SpO2 99%   BMI 33.63 kg/m      06/05/2022    9:46 AM 06/02/2022    2:30 PM 05/28/2022    6:45 PM  BP/Weight  Systolic BP 315 400 867  Diastolic BP 78 72 78  Wt. (Lbs) 234.4 230   BMI 33.63 kg/m2 33 kg/m2       Physical Exam Constitutional:      Appearance: He is well-developed.  Cardiovascular:     Rate and Rhythm: Normal rate.     Heart sounds: Normal heart sounds. No murmur heard. Pulmonary:     Effort: Pulmonary effort is normal.     Breath sounds: Normal breath sounds. No wheezing or rales.  Chest:     Chest wall: No tenderness.  Abdominal:     General: Bowel sounds are normal. There is no distension.     Palpations: Abdomen is soft. There is no mass.     Tenderness: There is no abdominal tenderness.  Musculoskeletal:        General: Normal range of motion.     Right lower leg: No edema.     Left lower leg: No edema.     Comments: Left foot dressing in place  Skin:    Comments: PICC line in right upper arm  Neurological:     Mental Status: He is alert and oriented to person, place, and time.  Psychiatric:        Mood and Affect: Mood normal.        Latest Ref Rng & Units 05/28/2022    4:43 PM 05/11/2022    4:46 AM 05/09/2022     1:33 AM  CMP  Glucose 70 - 99 mg/dL 180  199  394   BUN 6 - 20 mg/dL 18  12  12    Creatinine 0.61 -  1.24 mg/dL 1.17  1.06  1.23   Sodium 135 - 145 mmol/L 138  135  134   Potassium 3.5 - 5.1 mmol/L 4.3  4.4  4.8   Chloride 98 - 111 mmol/L 105  98  105   CO2 22 - 32 mmol/L 23  30  25    Calcium 8.9 - 10.3 mg/dL 8.8  8.6  8.2   Total Protein 6.5 - 8.1 g/dL   5.8   Total Bilirubin 0.3 - 1.2 mg/dL   0.5   Alkaline Phos 38 - 126 U/L   59   AST 15 - 41 U/L   17   ALT 0 - 44 U/L   13     Lipid Panel     Component Value Date/Time   CHOL 101 12/14/2021 1239   TRIG 134 12/14/2021 1239   HDL 29 (L) 12/14/2021 1239   CHOLHDL 3.5 12/14/2021 1239   VLDL 27 12/14/2021 1239   LDLCALC 45 12/14/2021 1239    CBC    Component Value Date/Time   WBC 6.8 05/28/2022 1643   RBC 4.09 (L) 05/28/2022 1643   HGB 10.8 (L) 05/28/2022 1643   HCT 34.7 (L) 05/28/2022 1643   PLT 306 05/28/2022 1643   MCV 84.8 05/28/2022 1643   MCH 26.4 05/28/2022 1643   MCHC 31.1 05/28/2022 1643   RDW 14.3 05/28/2022 1643   LYMPHSABS 2.0 05/28/2022 1643   MONOABS 0.6 05/28/2022 1643   EOSABS 0.3 05/28/2022 1643   BASOSABS 0.0 05/28/2022 1643    Lab Results  Component Value Date   HGBA1C 11.5 (H) 05/07/2022   Assessment & Plan:  1. Type 2 diabetes mellitus with other specified complication, with long-term current use of insulin (HCC) Uncontrolled with A1c of 11.5; goal is less than 7.0 We will add Ozempic to regimen and will titrate up at next visit if indicated Continue NPH Counseled on Diabetic diet, my plate method, 333 minutes of moderate intensity exercise/week Blood sugar logs with fasting goals of 80-120 mg/dl, random of less than 180 and in the event of sugars less than 60 mg/dl or greater than 400 mg/dl encouraged to notify the clinic. Advised on the need for annual eye exams, annual foot exams, Pneumonia vaccine. - POCT glucose (manual entry) - Microalbumin / creatinine urine ratio -  Semaglutide,0.25 or 0.5MG/DOS, (OZEMPIC, 0.25 OR 0.5 MG/DOSE,) 2 MG/1.5ML SOPN; Inject 0.25 mg into the skin once a week.  Dispense: 2 mL; Refill: 6 - Insulin NPH, Human,, Isophane, (HUMULIN N KWIKPEN) 100 UNIT/ML Kiwkpen; Inject 20 Units into the skin 2 (two) times daily.  Dispense: 30 mL; Refill: 6 - Insulin Pen Needle 32G X 4 MM MISC; Use to inject insulin up to 4 times daily as directed.  Dispense: 100 each; Refill: 0 - metFORMIN (GLUCOPHAGE) 500 MG tablet; Take 1 tablet (500 mg total) by mouth 2 (two) times daily.  Dispense: 180 tablet; Refill: 1  2. 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 Need for hepatitis C screening test - HCV Ab w Reflex to Quant PCR  4. At high risk for cardiovascular disease Currently not on a statin I have initiated atorvastatin for primary cardiovascular prevention - atorvastatin (LIPITOR) 20 MG tablet; Take 1 tablet (20 mg total) by mouth daily.  Dispense: 90 tablet; Refill:  1    Meds ordered this encounter  Medications   Semaglutide,0.25 or 0.5MG/DOS, (OZEMPIC, 0.25 OR 0.5 MG/DOSE,) 2 MG/1.5ML SOPN    Sig: Inject 0.25 mg into the skin once a week.    Dispense:  2 mL    Refill:  6   Insulin NPH, Human,, Isophane, (HUMULIN N KWIKPEN) 100 UNIT/ML Kiwkpen    Sig: Inject 20 Units into the skin 2 (two) times daily.    Dispense:  30 mL    Refill:  6   Insulin Pen Needle 32G X 4 MM MISC    Sig: Use to inject insulin up to 4 times daily as directed.    Dispense:  100 each    Refill:  0   atorvastatin (LIPITOR) 20 MG tablet    Sig: Take 1 tablet (20 mg total) by mouth daily.    Dispense:  90 tablet    Refill:  1   metFORMIN (GLUCOPHAGE) 500 MG tablet    Sig: Take 1 tablet (500 mg total) by mouth 2 (two) times  daily.    Dispense:  180 tablet    Refill:  1    Follow-up: Return in about 3 months (around 09/05/2022) for Chronic medical conditions.       Charlott Rakes, MD, FAAFP. [facility] and [place], [place]   06/05/2022, 10:15 AM

## 2022-06-06 LAB — HCV INTERPRETATION

## 2022-06-06 LAB — HCV AB W REFLEX TO QUANT PCR: HCV Ab: NONREACTIVE

## 2022-06-07 LAB — MICROALBUMIN / CREATININE URINE RATIO
Creatinine, Urine: 149.6 mg/dL
Microalb/Creat Ratio: 20 mg/g creat (ref 0–29)
Microalbumin, Urine: 29.9 ug/mL

## 2022-06-07 NOTE — Progress Notes (Signed)
[name] ([id number]) Visit Report for 06/05/2022 Arrival Information Details Patient Name: Date of Service: [name] 06/05/2022 10:45 A M Medical Record Number: [id number] Patient Account Number: [id number] Date of Birth/Sex: Treating RN: 12-09-1965 (56 y.o. [name] Primary Care Banjamin Stovall: [name] Other Clinician: Referring Dorsey Charette: Treating Ona Roehrs/Extender: Sherryl Manges in Treatment: 2 Visit Information History Since Last Visit Added or deleted any medications: No Patient Arrived: Knee Scooter Any new allergies or adverse reactions: No Arrival Time: 10:50 Had a fall or experienced change in No Accompanied By: brother activities of daily living that may affect Transfer Assistance: None risk of falls: Patient Identification Verified: Yes Signs or symptoms of abuse/neglect since last visito No Secondary Verification Process Completed: Yes Hospitalized since last visit: No Patient Requires Transmission-Based Precautions: No Implantable device outside of the clinic excluding No Patient Has Alerts: Yes cellular tissue based products placed in the center Patient Alerts: L ABI: 1.09 TBI: 0.97 since last visit: Has Dressing in Place as Prescribed: Yes Pain Present Now: No Electronic Signature(s) Signed: 06/07/2022 5:10:18 PM By: [name] Entered By: [name] on 06/05/2022 10:53:48 -------------------------------------------------------------------------------- Encounter Discharge Information Details Patient Name: Date of Service: [name], [name] 06/05/2022 10:45 A M Medical Record Number: [id number] Patient Account Number: [id number] Date of Birth/Sex: Treating RN: 06/23/1966 (56 y.o. [name] Primary Care Aileana Hodder: [name] Other Clinician: Referring Rip Hawes: Treating Ralyn Stlaurent/Extender: Sherryl Manges in Treatment: 2 Encounter Discharge Information Items Post Procedure Vitals Discharge Condition:  Stable Temperature (F): 97.9 Ambulatory Status: Knee Scooter Pulse (bpm): 84 Discharge Destination: Home Respiratory Rate (breaths/min): 18 Transportation: Private Auto Blood Pressure (mmHg): 99/67 Accompanied By: brother Schedule Follow-up Appointment: Yes Clinical Summary of Care: Patient Declined Electronic Signature(s) Signed: 06/07/2022 5:10:18 PM By: [name] Entered By: [name] on 06/05/2022 11:41:25 -------------------------------------------------------------------------------- Lower Extremity Assessment Details Patient Name: Date of Service: [name], [name] 06/05/2022 10:45 A M Medical Record Number: [id number] Patient Account Number: [id number] Date of Birth/Sex: Treating RN: 01-08-1966 (56 y.o. [name] Primary Care Clois Treanor: [name] Other Clinician: Referring Ciin Brazzel: Treating Delonda Coley/Extender: Sherryl Manges in Treatment: 2 Edema Assessment Assessed: [Left: No] [Right: No] Edema: [Left: Ye] [Right: s] Calf Left: Right: Point of Measurement: 33 cm From Medial Instep 40.1 cm Ankle Left: Right: Point of Measurement: 10 cm From Medial Instep 25.5 cm Vascular Assessment Pulses: Dorsalis Pedis Palpable: [Left:Yes] Electronic Signature(s) Signed: 06/07/2022 5:10:18 PM By: [name] Entered By: [name] on 06/05/2022 10:57:43 -------------------------------------------------------------------------------- Multi Wound Chart Details Patient Name: Date of Service: [name], [name] 06/05/2022 10:45 A M Medical Record Number: [id number] Patient Account Number: [id number] Date of Birth/Sex: Treating RN: 01-06-1966 (57 y.o. [name] Primary Care Ariyan Brisendine: [name] Other Clinician: Referring Clelia Trabucco: Treating Lantz Hermann/Extender: Sherryl Manges in Treatment: 2 Vital Signs Height(in): 70 Pulse(bpm): 84 Weight(lbs): 215 Blood Pressure(mmHg): 99/67 Body Mass Index(BMI):  30.8 Temperature(F): 97.9 Respiratory Rate(breaths/min): 18 Photos: [1:Left, Plantar Foot] [N/A:N/A N/A] Wound Location: [1:Surgical Injury] [N/A:N/A] Wounding Event: [1:Diabetic Wound/Ulcer of the Lower] [N/A:N/A] Primary Etiology: [1:Extremity Type II Diabetes] [N/A:N/A] Comorbid History: [1:05/05/2022] [N/A:N/A] Date Acquired: [1:2] [N/A:N/A] Weeks of Treatment: [1:Open] [N/A:N/A] Wound Status: [1:No] [N/A:N/A] Wound Recurrence: [1:Yes] [N/A:N/A] Pending A mputation on Presentation: [1:14.7x8.6x1.2] [N/A:N/A] Measurements L x W x D (cm) [1:99.29] [N/A:N/A] A (cm) : rea [1:119.148] [N/A:N/A] Volume (cm) : [1:6.50%] [N/A:N/A] % Reduction in A rea: [1:25.20%] [N/A:N/A] % Reduction in Volume: [1:Grade 3] [N/A:N/A] Classification: [1:Large] [N/A:N/A] Exudate A mount: [1:Serosanguineous] [N/A:N/A] Exudate Type: [1:red, brown] [N/A:N/A] Exudate  Color: [1:Flat and Intact] [N/A:N/A] Wound Margin: [1:Large (67-100%)] [N/A:N/A] Granulation A mount: [1:Red, Pink] [N/A:N/A] Granulation Quality: [1:Small (1-33%)] [N/A:N/A] Necrotic A mount: [1:Fat Layer (Subcutaneous Tissue): Yes N/A] Exposed Structures: [1:Tendon: Yes Bone: Yes Fascia: No Muscle: No Joint: No Small (1-33%)] [N/A:N/A] Epithelialization: [1:Debridement - Excisional] [N/A:N/A] Debridement: Pre-procedure Verification/Time Out 11:11 [N/A:N/A] Taken: [1:Lidocaine 5% topical ointment] [N/A:N/A] Pain Control: [1:Subcutaneous, Slough] [N/A:N/A] Tissue Debrided: [1:Skin/Subcutaneous Tissue] [N/A:N/A] Level: [1:126.42] [N/A:N/A] Debridement A (sq cm): [1:rea Curette] [N/A:N/A] Instrument: [1:Minimum] [N/A:N/A] Bleeding: [1:Pressure] [N/A:N/A] Hemostasis A chieved: [1:5] [N/A:N/A] Procedural Pain: [1:0] [N/A:N/A] Post Procedural Pain: [1:Procedure was tolerated well] [N/A:N/A] Debridement Treatment Response: [1:14.7x8.6x1.2] [N/A:N/A] Post Debridement Measurements L x W x D (cm) [1:119.148] [N/A:N/A] Post Debridement  Volume: (cm) [1:Debridement] [N/A:N/A] Treatment Notes Electronic Signature(s) Signed: 06/05/2022 11:29:43 AM By: Duanne Guess MD FACS Signed: 06/07/2022 5:10:18 PM By: [name] Entered By: Duanne Guess on 06/05/2022 11:29:43 -------------------------------------------------------------------------------- Multi-Disciplinary Care Plan Details Patient Name: Date of Service: [name], [name] 06/05/2022 10:45 A M Medical Record Number: [id number] Patient Account Number: [id number] Date of Birth/Sex: Treating RN: 1966/06/23 (56 y.o. [name] Primary Care Cayley Pester: [name] Other Clinician: Referring Arcenia Scarbro: Treating Elijah Phommachanh/Extender: Sherryl Manges in Treatment: 2 Multidisciplinary Care Plan reviewed with physician Active Inactive Nutrition Nursing Diagnoses: Impaired glucose control: actual or potential Potential for alteratiion in Nutrition/Potential for imbalanced nutrition Goals: Patient/caregiver agrees to and verbalizes understanding of need to use nutritional supplements and/or vitamins as prescribed Date Initiated: 05/18/2022 Target Resolution Date: 06/16/2022 Goal Status: Active Patient/caregiver will maintain therapeutic glucose control Date Initiated: 05/18/2022 Target Resolution Date: 06/16/2022 Goal Status: Active Interventions: Assess HgA1c results as ordered upon admission and as needed Assess patient nutrition upon admission and as needed per policy Provide education on elevated blood sugars and impact on wound healing Provide education on nutrition Treatment Activities: Education provided on Nutrition : 05/18/2022 Notes: Wound/Skin Impairment Nursing Diagnoses: Impaired tissue integrity Knowledge deficit related to smoking impact on wound healing Knowledge deficit related to ulceration/compromised skin integrity Goals: Patient will demonstrate a reduced rate of smoking or cessation of smoking Date Initiated:  05/18/2022 Target Resolution Date: 06/16/2022 Goal Status: Active Patient/caregiver will verbalize understanding of skin care regimen Date Initiated: 05/18/2022 Target Resolution Date: 06/16/2022 Goal Status: Active Interventions: Assess patient/caregiver ability to obtain necessary supplies Assess patient/caregiver ability to perform ulcer/skin care regimen upon admission and as needed Assess ulceration(s) every visit Provide education on smoking Provide education on ulcer and skin care Notes: Electronic Signature(s) Signed: 06/07/2022 5:10:18 PM By: [name] Entered By: [name] on 06/05/2022 11:01:37 -------------------------------------------------------------------------------- Pain Assessment Details Patient Name: Date of Service: [name], [name] 06/05/2022 10:45 A M Medical Record Number: [id number] Patient Account Number: [id number] Date of Birth/Sex: Treating RN: 07/10/1966 (56 y.o. [name] Primary Care Caitland Porchia: [name] Other Clinician: Referring Graylon Amory: Treating Joram Venson/Extender: Sherryl Manges in Treatment: 2 Active Problems Location of Pain Severity and Description of Pain Patient Has Paino No Site Locations Rate the pain. Rate the pain. Current Pain Level: 0 Pain Management and Medication Current Pain Management: Electronic Signature(s) Signed: 06/07/2022 5:10:18 PM By: [name] Entered By: [name] on 06/05/2022 10:53:57 -------------------------------------------------------------------------------- Patient/Caregiver Education Details Patient Name: Date of Service: [name], [name] 7/3/2023andnbsp10:45 A M Medical Record Number: [id number] Patient Account Number: [id number] Date of Birth/Gender: Treating RN: 12-05-1965 (56 y.o. [name] Primary Care Physician: [name] Other Clinician: Referring Physician: Treating Physician/Extender: Sherryl Manges in Treatment:  2 Education Assessment Education Provided To: Patient Education Topics  Provided Wound/Skin Impairment: Methods: Explain/Verbal Responses: Reinforcements needed, State content correctly Electronic Signature(s) Signed: 06/07/2022 5:10:18 PM By: Adline Peals Entered By: Adline Peals on 06/05/2022 11:01:48 -------------------------------------------------------------------------------- Wound Assessment Details Patient Name: Date of Service: [name], [name] 06/05/2022 10:45 A M Medical Record Number: [id number] Patient Account Number: [id number] Date of Birth/Sex: Treating RN: 12/19/1965 (56 y.o. Janyth Contes Primary Care Makenze Ellett: Charlott Rakes Other Clinician: Referring Zaela Graley: Treating Canaan Prue/Extender: Jadene Pierini in Treatment: 2 Wound Status Wound Number: 1 Primary Etiology: Diabetic Wound/Ulcer of the Lower Extremity Wound Location: Left, Plantar Foot Wound Status: Open Wounding Event: Surgical Injury Comorbid History: Type II Diabetes Date Acquired: 05/05/2022 Weeks Of Treatment: 2 Clustered Wound: No Pending Amputation On Presentation Photos Wound Measurements Length: (cm) 14.7 Width: (cm) 8.6 Depth: (cm) 1.2 Area: (cm) 99.29 Volume: (cm) 119.148 % Reduction in Area: 6.5% % Reduction in Volume: 25.2% Epithelialization: Small (1-33%) Tunneling: No Undermining: No Wound Description Classification: Grade 3 Wound Margin: Flat and Intact Exudate Amount: Large Exudate Type: Serosanguineous Exudate Color: red, brown Foul Odor After Cleansing: No Slough/Fibrino Yes Wound Bed Granulation Amount: Large (67-100%) Exposed Structure Granulation Quality: Red, Pink Fascia Exposed: No Necrotic Amount: Small (1-33%) Fat Layer (Subcutaneous Tissue) Exposed: Yes Necrotic Quality: Adherent Slough Tendon Exposed: Yes Muscle Exposed: No Joint Exposed: No Bone Exposed: Yes Treatment Notes Wound #1 (Foot) Wound Laterality: Plantar,  Left Cleanser Soap and Water Discharge Instruction: May shower and wash wound with dial antibacterial soap and water prior to dressing change. Wound Cleanser Discharge Instruction: Cleanse the wound with wound cleanser prior to applying a clean dressing using gauze sponges, not tissue or cotton balls. Peri-Wound Care Topical Primary Dressing Dakin's Solution 0.25%, 16 (oz) Discharge Instruction: Moisten gauze with Dakin's solution Secondary Dressing ABD Pad, 8x10 Discharge Instruction: Apply over primary dressing as directed. Woven Gauze Sponge, Non-Sterile 4x4 in Discharge Instruction: Apply over primary dressing as directed. Secured With Elastic Bandage 4 inch (ACE bandage) Discharge Instruction: Secure with ACE bandage as directed. Kerlix Roll Sterile, 4.5x3.1 (in/yd) Discharge Instruction: Secure with Kerlix as directed. Compression Wrap Compression Stockings Add-Ons Electronic Signature(s) Signed: 06/07/2022 5:10:18 PM By: Adline Peals Entered By: Adline Peals on 06/05/2022 11:00:49 -------------------------------------------------------------------------------- Vitals Details Patient Name: Date of Service: [name], [name] 06/05/2022 10:45 A M Medical Record Number: [id number] Patient Account Number: [id number] Date of Birth/Sex: Treating RN: 08-Jun-1966 (56 y.o. Janyth Contes Primary Care Yuliya Nova: Charlott Rakes Other Clinician: Referring Canaan Holzer: Treating Sharmeka Palmisano/Extender: Jadene Pierini in Treatment: 2 Vital Signs Time Taken: 10:54 Temperature (F): 97.9 Height (in): 70 Pulse (bpm): 84 Weight (lbs): 215 Respiratory Rate (breaths/min): 18 Body Mass Index (BMI): 30.8 Blood Pressure (mmHg): 99/67 Reference Range: 80 - 120 mg / dl Electronic Signature(s) Signed: 06/07/2022 5:10:18 PM By: Adline Peals Entered By: Adline Peals on 06/05/2022 10:54:29

## 2022-06-07 NOTE — Progress Notes (Signed)
MARTEN, MARCHAK ([id number]) Visit Report for 06/05/2022 Chief Complaint Document Details Patient Name: Date of Service: [name], [name] 06/05/2022 10:45 A M Medical Record Number: [id number] Patient Account Number: [id number] Date of Birth/Sex: Treating RN: 12-26-1965 (56 y.o. Janyth Contes Primary Care Provider: Charlott Rakes Other Clinician: Referring Provider: Treating Provider/Extender: Jadene Pierini in Treatment: 2 Information Obtained from: Patient Chief Complaint Patients presents for treatment of an open diabetic ulcer Electronic Signature(s) Signed: 06/05/2022 11:30:00 AM By: Fredirick Maudlin MD FACS Entered By: Fredirick Maudlin on 06/05/2022 11:30:00 -------------------------------------------------------------------------------- Debridement Details Patient Name: Date of Service: [name], [name] 06/05/2022 10:45 A M Medical Record Number: [id number] Patient Account Number: [id number] Date of Birth/Sex: Treating RN: [birth year] (56 y.o. Janyth Contes Primary Care Provider: Charlott Rakes Other Clinician: Referring Provider: Treating Provider/Extender: Jadene Pierini in Treatment: 2 Debridement Performed for Assessment: Wound #1 [place] Performed By: Physician Fredirick Maudlin, MD Debridement Type: Debridement Severity of Tissue Pre Debridement: Fat layer exposed Level of Consciousness (Pre-procedure): Awake and Alert Pre-procedure Verification/Time Out Yes - 11:11 Taken: Start Time: 11:11 Pain Control: Lidocaine 5% topical ointment T Area Debrided (L x W): otal 14.7 (cm) x 8.6 (cm) = 126.42 (cm) Tissue and other material debrided: Viable, Non-Viable, Slough, Subcutaneous, Slough Level: Skin/Subcutaneous Tissue Debridement Description: Excisional Instrument: Curette Bleeding: Minimum Hemostasis Achieved: Pressure Procedural Pain: 5 Post Procedural Pain: 0 Response to Treatment: Procedure was tolerated well Level of Consciousness (Post-  Awake and Alert procedure): Post Debridement Measurements of Total Wound Length: (cm) 14.7 Width: (cm) 8.6 Depth: (cm) 1.2 Volume: (cm) 119.148 Character of Wound/Ulcer Post Debridement: Improved Severity of Tissue Post Debridement: Fat layer exposed Post Procedure Diagnosis Same as Pre-procedure Electronic Signature(s) Signed: 06/05/2022 11:38:54 AM By: Fredirick Maudlin MD FACS Signed: 06/07/2022 5:10:18 PM By: Adline Peals Entered By: Adline Peals on 06/05/2022 11:13:04 -------------------------------------------------------------------------------- HPI Details Patient Name: Date of Service: [name], [name] 06/05/2022 10:45 A M Medical Record Number: [id number] Patient Account Number: [id number] Date of Birth/Sex: Treating RN: 01-Oct-1966 (56 y.o. Janyth Contes Primary Care Provider: Charlott Rakes Other Clinician: Referring Provider: Treating Provider/Extender: Jadene Pierini in Treatment: 2 History of Present Illness HPI Description: ADMISSION 05/18/2022 This is a 56 year old poorly controlled type II diabetic (last A1c 11.5%). In January of this year, he presented to the hospital with an abscess in his left foot. He also had a foreign body present. It was removed in the ER and he was admitted to the hospital for IV antibiotics. He was subsequently discharged on oral antibiotics. Due to financial concerns, he has not taken his diabetic medications (metformin and 70/30 insulin) and as a result, presented back to the emergency room on June 1 with worsening findings, including frank pus and osteomyelitis on MRI. BKA was recommended, but he declined. He was taken to the operating room by Dr. Sharol Given who performed an extensive debridement. Cultures were taken and he was placed on IV antibiotics. He saw infectious disease while in the hospital who prescribed a 6-week course of IV antibiotics including cefazolin as well as oral metronidazole and levofloxacin. The  infectious disease provider also indicated that he would benefit from below-knee amputation, but the patient desired another opinion and therefore he has been referred to wound care center for further evaluation and management. ABIs obtained while he was in the hospital demonstrate adequate blood flow. Essentially the entire bottom of the patient's left foot has been removed. The heads of the majority of his metatarsal bones are exposed along with their  accompanying tendons. Muscle and fat are exposed as well. The wound surface is fairly dry; the patient reports that he was not given any specific wound care instructions other than to place a dry dressing on it after showering. There is no odor or purulent drainage identified. 05/26/2022: The wound measures a little bit smaller today. There is some good granulation tissue beginning to form on the surface. He still has exposed bone and tendon. There is some slough accumulation. He saw infectious disease earlier this week and was frustrated that they recommended amputation. He is interested in another surgical opinion. He continues to smoke but says that he has cut down. He is using a knee scooter for mobility so that he can stay off of his foot. 06/05/2022: He saw Dr. Sharol Given last week who was supportive of our efforts to try to salvage the foot. I referred him to vascular surgery to see Dr. Carlis Abbott but he has not received an appointment yet. He continues on IV antibiotics. He says that after our conversation last week about him being unable to initiate hyperbarics if he was going to continue to smoke, he has not had a cigarette since. He is now at 4 weeks of conventional management and his insurance was initiated on July 1. Today, the wound continues to show evidence of improvement. There is good granulation tissue forming. The metatarsals are still exposed but actually have some pink periosteum. There is still some slough and fibrinous exudate present. No odor  or purulent drainage. We have just been using Dakin's dressing changes for now. Electronic Signature(s) Signed: 06/05/2022 11:33:16 AM By: Fredirick Maudlin MD FACS Entered By: Fredirick Maudlin on 06/05/2022 11:33:16 -------------------------------------------------------------------------------- Physical Exam Details Patient Name: Date of Service: [name], [name] 06/05/2022 10:45 A M Medical Record Number: [id number] Patient Account Number: [id number] Date of Birth/Sex: Treating RN: 08-03-1966 (56 y.o. Janyth Contes Primary Care Provider: Charlott Rakes Other Clinician: Referring Provider: Treating Provider/Extender: Jadene Pierini in Treatment: 2 Constitutional . . . . No acute distress.Marland Kitchen Respiratory Normal work of breathing on room air.. Notes 06/05/2022: Today, the wound continues to show evidence of improvement. There is good granulation tissue forming. The metatarsals are still exposed but actually have some pink periosteum. There is still some slough and fibrinous exudate present. No odor or purulent drainage. Electronic Signature(s) Signed: 06/05/2022 11:34:18 AM By: Fredirick Maudlin MD FACS Entered By: Fredirick Maudlin on 06/05/2022 11:34:18 -------------------------------------------------------------------------------- Physician Orders Details Patient Name: Date of Service: [name], [name] 06/05/2022 10:45 A M Medical Record Number: [id number] Patient Account Number: [id number] Date of Birth/Sex: Treating RN: 09-Oct-1966 (56 y.o. Janyth Contes Primary Care Provider: Charlott Rakes Other Clinician: Referring Provider: Treating Provider/Extender: Jadene Pierini in Treatment: 2 Verbal / Phone Orders: No Diagnosis Coding ICD-10 Coding Code Description E11.65 Type 2 diabetes mellitus with hyperglycemia E11.621 Type 2 diabetes mellitus with foot ulcer L97.524 Non-pressure chronic ulcer of other part of left foot with necrosis of bone M86.179 Other acute  osteomyelitis, unspecified ankle and foot Follow-up Appointments ppointment in 1 week. - Dr. Celine Ahr - Room 2 - 7/10 at 8:30 AM Return A Bathing/ Shower/ Hygiene May shower and wash wound with soap and water. - Prior to dressing change Edema Control - Lymphedema / SCD / Other Elevate legs to the level of the heart or above for 30 minutes daily and/or when sitting, a frequency of: - throughout the day Moisturize legs daily. Off-Loading Other: - ABSOLUTELY NO PRESSURE ON LEFT FOOT! Hyperbaric Oxygen Therapy Evaluate  for HBO Therapy Indication: - Wagner grade 4 diabetic foot ulcer If appropriate for treatment, begin HBOT per protocol: 2.5 ATA for 90 Minutes with 2 Five (5) Minute A Breaks ir Total Number of Treatments: - 40 One treatments per day (delivered Monday through Friday unless otherwise specified in Special Instructions below): Finger stick Blood Glucose Pre- and Post- HBOT Treatment. Follow Hyperbaric Oxygen Glycemia Protocol A frin (Oxymetazoline HCL) 0.05% nasal spray - 1 spray in both nostrils daily as needed prior to HBO treatment for difficulty clearing ears Wound Treatment Wound #1 - Foot Wound Laterality: Plantar, Left Cleanser: Soap and Water 1 x Per Day/30 Days Discharge Instructions: May shower and wash wound with dial antibacterial soap and water prior to dressing change. Cleanser: Wound Cleanser 1 x Per Day/30 Days Discharge Instructions: Cleanse the wound with wound cleanser prior to applying a clean dressing using gauze sponges, not tissue or cotton balls. Prim Dressing: Dakin's Solution 0.25%, 16 (oz) 1 x Per Day/30 Days ary Discharge Instructions: Moisten gauze with Dakin's solution Secondary Dressing: ABD Pad, 8x10 1 x Per Day/30 Days Discharge Instructions: Apply over primary dressing as directed. Secondary Dressing: Woven Gauze Sponge, Non-Sterile 4x4 in 1 x Per Day/30 Days Discharge Instructions: Apply over primary dressing as directed. Secured With:  Elastic Bandage 4 inch (ACE bandage) 1 x Per Day/30 Days Discharge Instructions: Secure with ACE bandage as directed. Secured With: [employer], 4.5x3.1 (in/yd) 1 x Per Day/30 Days Discharge Instructions: Secure with Kerlix as directed. Radiology X-ray, Chest PA and Lateral - Pre-hyperbaric oxygen therapy CPT 71046 ICD10: L97.524 : [employer] EKG - Pre-hyperbaric oxygen therapy Patient Medications llergies: No Known Drug Allergies A Notifications Medication Indication Start End 06/05/2022 Dakin's Solution DOSE miscellaneous 0.5 % solution - moisten gauze with solution for daily dressing changes GLYCEMIA INTERVENTIONS PROTOCOL PRE-HBO GLYCEMIA INTERVENTIONS ACTION INTERVENTION Obtain pre-HBO capillary blood glucose (ensure 1 physician order is in chart). A. Notify HBO physician and await physician orders. 2 If result is 70 mg/dl or below: B. If the result meets the hospital definition of a critical result, follow hospital policy. A. Give patient an 8 ounce Glucerna Shake, an 8 ounce Ensure, or 8 ounces of a Glucerna/Ensure equivalent dietary supplement*. B. Wait 30 minutes. If result is 71 mg/dl to 130 mg/dl: C. Retest patients capillary blood glucose (CBG). D. If result greater than or equal to 110 mg/dl, proceed with HBO. If result less than 110 mg/dl, notify HBO physician and consider holding HBO. If result is 131 mg/dl to 249 mg/dl: A. Proceed with HBO. A. Notify HBO physician and await physician orders. B. It is recommended to hold HBO and do If result is 250 mg/dl or greater: blood/urine ketone testing. C. If the result meets the hospital definition of a critical result, follow hospital policy. POST-HBO GLYCEMIA INTERVENTIONS ACTION INTERVENTION Obtain post HBO capillary blood glucose (ensure 1 physician order is in chart). A. Notify HBO physician and await physician orders. 2 If result is 70 mg/dl or below: B. If the result meets the hospital  definition of a critical result, follow hospital policy. A. Give patient an 8 ounce Glucerna Shake, an 8 ounce Ensure, or 8 ounces of a Glucerna/Ensure equivalent dietary supplement*. B. Wait 15 minutes for symptoms of If result is 71 mg/dl to 100 mg/dl: hypoglycemia (i.e. nervousness, anxiety, sweating, chills, clamminess, irritability, confusion, tachycardia or dizziness). C. If patient asymptomatic, discharge patient. If patient symptomatic, repeat capillary blood glucose (CBG) and notify HBO physician. If result is 101  mg/dl to 161 mg/dl: A. Discharge patient. A. Notify HBO physician and await physician orders. B. It is recommended to do blood/urine ketone If result is 250 mg/dl or greater: testing. C. If the result meets the hospital definition of a critical result, follow hospital policy. *Juice or candies are NOT equivalent products. If patient refuses the Glucerna or Ensure, please consult the hospital dietitian for an appropriate substitute. Electronic Signature(s) Signed: 06/05/2022 11:36:07 AM By: Duanne Guess MD FACS Entered By: Duanne Guess on 06/05/2022 11:36:07 Prescription 06/05/2022 -------------------------------------------------------------------------------- Parke Simmers MD Patient Name: Provider: June 30, 1966 [id number] Date of Birth: NPI#[name] [id number] Sex: DEA #: [phone] Phone #: License #: Eligha Bridegroom [facility] Patient Address: [facility] [address] [place], [place] [address] [address] [place], [place] [address] [phone] Allergies No Known Drug Allergies Provider's Orders X-ray, Chest PA and Lateral - Pre-hyperbaric oxygen therapy CPT 71046 ICD10: L97.524 : Hand Signature: Date(s): Prescription 06/05/2022 Parke Simmers MD Patient Name: Provider: [birth year] [id number] Date of Birth: NPI#: [name] [id number] Sex: DEA #: [phone]  [phone] Phone #: License #: Eligha Bridegroom [facility] Patient Address: [facility] [address] [place], [place] [address] [address] [place], [place] [address] [phone] Allergies No Known Drug Allergies Provider's Orders EKG - Pre-hyperbaric oxygen therapy Hand Signature: Date(s): Electronic Signature(s) Signed: 06/05/2022 11:38:15 AM By: Duanne Guess MD FACS Entered By: Duanne Guess on 06/05/2022 11:38:15 -------------------------------------------------------------------------------- Problem List Details Patient Name: Date of Service: [name], [name] 06/05/2022 10:45 A M Medical Record Number: [id number] Patient Account Number: [id number] Date of Birth/Sex: Treating RN: 1966-11-07 (56 y.o. Marlan Palau Primary Care Provider: [name] Other Clinician: Referring Provider: Treating Provider/Extender: Sherryl Manges in Treatment: 2 Active Problems ICD-10 Encounter Code Description Active Date MDM Diagnosis E11.65 Type 2 diabetes mellitus with hyperglycemia 05/18/2022 No Yes E11.621 Type 2 diabetes mellitus with foot ulcer 05/18/2022 No Yes L97.524 Non-pressure chronic ulcer of other part of left foot with necrosis of bone 05/18/2022 No Yes M86.179 Other acute osteomyelitis, unspecified ankle and foot 05/18/2022 No Yes Inactive Problems Resolved Problems Electronic Signature(s) Signed: 06/05/2022 11:29:36 AM By: Duanne Guess MD FACS Entered By: Duanne Guess on 06/05/2022 11:29:36 -------------------------------------------------------------------------------- Progress Note Details Patient Name: Date of Service: [name], [name] 06/05/2022 10:45 A M Medical Record Number: [id number] Patient Account Number: [id number] Date of Birth/Sex: Treating RN: Jun 16, 1966 (57 y.o. Marlan Palau Primary Care Provider: [name] Other Clinician: Referring Provider: Treating Provider/Extender: Sherryl Manges in Treatment: 2 Subjective Chief Complaint Information obtained from Patient Patients presents for treatment of an open diabetic ulcer History of Present Illness (HPI) ADMISSION 05/18/2022 This is a 56 year old poorly controlled type II diabetic (last A1c 11.5%). In January of this year, he presented to the hospital with an abscess in his left foot. He also had a foreign body present. It was removed in the ER and he was admitted to the hospital for IV antibiotics. He was subsequently discharged on oral antibiotics. Due to financial concerns, he has not taken his diabetic medications (metformin and 70/30 insulin) and as a result, presented back to the emergency room on June 1 with worsening findings, including frank pus and osteomyelitis on MRI. BKA was recommended, but he declined. He was taken to the operating room by Dr. Lajoyce Corners who performed an extensive debridement. Cultures were taken and he was placed on IV antibiotics. He saw infectious disease while in the  hospital who prescribed a 6-week course of IV antibiotics including cefazolin as well as oral metronidazole and levofloxacin. The infectious disease provider also indicated that he would benefit from below-knee amputation, but the patient desired another opinion and therefore he has been referred to wound care center for further evaluation and management. ABIs obtained while he was in the hospital demonstrate adequate blood flow. Essentially the entire bottom of the patient's left foot has been removed. The heads of the majority of his metatarsal bones are exposed along with their accompanying tendons. Muscle and fat are exposed as well. The wound surface is fairly dry; the patient reports that he was not given any specific wound care instructions other than to place a dry dressing on it after showering. There is no odor or purulent drainage identified. 05/26/2022: The wound measures a little bit smaller today. There is some  good granulation tissue beginning to form on the surface. He still has exposed bone and tendon. There is some slough accumulation. He saw infectious disease earlier this week and was frustrated that they recommended amputation. He is interested in another surgical opinion. He continues to smoke but says that he has cut down. He is using a knee scooter for mobility so that he can stay off of his foot. 06/05/2022: He saw Dr. Lajoyce Corners last week who was supportive of our efforts to try to salvage the foot. I referred him to vascular surgery to see Dr. Chestine Spore but he has not received an appointment yet. He continues on IV antibiotics. He says that after our conversation last week about him being unable to initiate hyperbarics if he was going to continue to smoke, he has not had a cigarette since. He is now at 4 weeks of conventional management and his insurance was initiated on July 1. Today, the wound continues to show evidence of improvement. There is good granulation tissue forming. The metatarsals are still exposed but actually have some pink periosteum. There is still some slough and fibrinous exudate present. No odor or purulent drainage. We have just been using Dakin's dressing changes for now. Patient History Information obtained from Patient. Family History Unknown History. Social History Current every day smoker, Marital Status - Single, Alcohol Use - Never, Drug Use - Prior History - Cocaine, Caffeine Use - Rarely. Medical History Endocrine Patient has history of Type II Diabetes Objective Constitutional No acute distress.. Vitals Time Taken: 10:54 AM, Height: 70 in, Weight: 215 lbs, BMI: 30.8, Temperature: 97.9 F, Pulse: 84 bpm, Respiratory Rate: 18 breaths/min, Blood Pressure: 99/67 mmHg. Respiratory Normal work of breathing on room air.. General Notes: 06/05/2022: Today, the wound continues to show evidence of improvement. There is good granulation tissue forming. The metatarsals are  still exposed but actually have some pink periosteum. There is still some slough and fibrinous exudate present. No odor or purulent drainage. Integumentary (Hair, Skin) Wound #1 status is Open. Original cause of wound was Surgical Injury. The date acquired was: 05/05/2022. The wound has been in treatment 2 weeks. The wound is located on the Left,Plantar Foot. The wound measures 14.7cm length x 8.6cm width x 1.2cm depth; 99.29cm^2 area and 119.148cm^3 volume. There is bone, tendon, and Fat Layer (Subcutaneous Tissue) exposed. There is no tunneling or undermining noted. There is a large amount of serosanguineous drainage noted. The wound margin is flat and intact. There is large (67-100%) red, pink granulation within the wound bed. There is a small (1-33%) amount of necrotic tissue within the wound bed including Adherent Slough.  Assessment Active Problems ICD-10 Type 2 diabetes mellitus with hyperglycemia Type 2 diabetes mellitus with foot ulcer Non-pressure chronic ulcer of other part of left foot with necrosis of bone Other acute osteomyelitis, unspecified ankle and foot Procedures Wound #1 Pre-procedure diagnosis of Wound #1 is a Diabetic Wound/Ulcer of the Lower Extremity located on the Left,Plantar Foot .Severity of Tissue Pre Debridement is: Fat layer exposed. There was a Excisional Skin/Subcutaneous Tissue Debridement with a total area of 126.42 sq cm performed by Fredirick Maudlin, MD. With the following instrument(s): Curette to remove Viable and Non-Viable tissue/material. Material removed includes Subcutaneous Tissue and Slough and after achieving pain control using Lidocaine 5% topical ointment. No specimens were taken. A time out was conducted at 11:11, prior to the start of the procedure. A Minimum amount of bleeding was controlled with Pressure. The procedure was tolerated well with a pain level of 5 throughout and a pain level of 0 following the procedure. Post Debridement  Measurements: 14.7cm length x 8.6cm width x 1.2cm depth; 119.148cm^3 volume. Character of Wound/Ulcer Post Debridement is improved. Severity of Tissue Post Debridement is: Fat layer exposed. Post procedure Diagnosis Wound #1: Same as Pre-Procedure Plan Follow-up Appointments: Return Appointment in 1 week. - Dr. Celine Ahr - Room 2 - 7/10 at 8:30 AM Bathing/ Shower/ Hygiene: May shower and wash wound with soap and water. - Prior to dressing change Edema Control - Lymphedema / SCD / Other: Elevate legs to the level of the heart or above for 30 minutes daily and/or when sitting, a frequency of: - throughout the day Moisturize legs daily. Off-Loading: Other: - ABSOLUTELY NO PRESSURE ON LEFT FOOT! Hyperbaric Oxygen Therapy: Evaluate for HBO Therapy Indication: - Wagner grade 4 diabetic foot ulcer If appropriate for treatment, begin HBOT per protocol: 2.5 ATA for 90 Minutes with 2 Five (5) Minute Air Breaks T Number of Treatments: - 40 otal One treatments per day (delivered Monday through Friday unless otherwise specified in Special Instructions below): Finger stick Blood Glucose Pre- and Post- HBOT Treatment. Follow Hyperbaric Oxygen Glycemia Protocol Afrin (Oxymetazoline HCL) 0.05% nasal spray - 1 spray in both nostrils daily as needed prior to HBO treatment for difficulty clearing ears Radiology ordered were: X-ray, Chest PA and Lateral - Pre-hyperbaric oxygen therapy CPT 71046 ICD10: L97.524 : ordered were: EKG - Pre-hyperbaric oxygen therapy The following medication(s) was prescribed: Dakin's Solution miscellaneous 0.5 % solution moisten gauze with solution for daily dressing changes starting 06/05/2022 WOUND #1: - Foot Wound Laterality: Plantar, Left Cleanser: Soap and Water 1 x Per Day/30 Days Discharge Instructions: May shower and wash wound with dial antibacterial soap and water prior to dressing change. Cleanser: Wound Cleanser 1 x Per Day/30 Days Discharge Instructions: Cleanse the  wound with wound cleanser prior to applying a clean dressing using gauze sponges, not tissue or cotton balls. Prim Dressing: Dakin's Solution 0.25%, 16 (oz) 1 x Per Day/30 Days ary Discharge Instructions: Moisten gauze with Dakin's solution Secondary Dressing: ABD Pad, 8x10 1 x Per Day/30 Days Discharge Instructions: Apply over primary dressing as directed. Secondary Dressing: Woven Gauze Sponge, Non-Sterile 4x4 in 1 x Per Day/30 Days Discharge Instructions: Apply over primary dressing as directed. Secured With: Elastic Bandage 4 inch (ACE bandage) 1 x Per Day/30 Days Discharge Instructions: Secure with ACE bandage as directed. Secured With: [employer], 4.5x3.1 (in/yd) 1 x Per Day/30 Days Discharge Instructions: Secure with Kerlix as directed. 06/05/2022: Today, the wound continues to show evidence of improvement. There is good granulation tissue forming.  The metatarsals are still exposed but actually have some pink periosteum. There is still some slough and fibrinous exudate present. No odor or purulent drainage. I used a curette to debride the slough and nonviable tissue from his wound. We will continue using Dakin's solution dressing changes for now. As his insurance is now active and he has had 4 weeks of conventional therapy, we will initiate the process to start hyperbaric oxygen therapy. EKG and chest x-ray will be obtained. Continue IV antibiotics per infectious disease. Follow-up with me in 1 week. Electronic Signature(s) Signed: 06/05/2022 11:37:46 AM By: Fredirick Maudlin MD FACS Entered By: Fredirick Maudlin on 06/05/2022 11:37:46 -------------------------------------------------------------------------------- HxROS Details Patient Name: Date of Service: [name], [name] 06/05/2022 10:45 A M Medical Record Number: [id number] Patient Account Number: [id number] Date of Birth/Sex: Treating RN: Jun 10, 1966 (56 y.o. Janyth Contes Primary Care Provider: Charlott Rakes Other  Clinician: Referring Provider: Treating Provider/Extender: Jadene Pierini in Treatment: 2 Information Obtained From Patient Endocrine Medical History: Positive for: Type II Diabetes Time with diabetes: over 10 years Treated with: Insulin, Oral agents Blood sugar tested every day: Yes Tested : twice a day Immunizations Pneumococcal Vaccine: Received Pneumococcal Vaccination: No Implantable Devices None Family and Social History Unknown History: Yes; Current every day smoker; Marital Status - Single; Alcohol Use: Never; Drug Use: Prior History - Cocaine; Caffeine Use: Rarely; Financial Concerns: No; Food, Clothing or Shelter Needs: No; Support System Lacking: No; Transportation Concerns: No Electronic Signature(s) Signed: 06/05/2022 11:38:54 AM By: Fredirick Maudlin MD FACS Signed: 06/07/2022 5:10:18 PM By: Adline Peals Entered By: Fredirick Maudlin on 06/05/2022 11:33:20 -------------------------------------------------------------------------------- SuperBill Details Patient Name: Date of Service: [name], [name] 06/05/2022 Medical Record Number: [id number] Patient Account Number: [id number] Date of Birth/Sex: Treating RN: Dec 23, 1965 (56 y.o. Janyth Contes Primary Care Provider: Charlott Rakes Other Clinician: Referring Provider: Treating Provider/Extender: Jadene Pierini in Treatment: 2 Diagnosis Coding ICD-10 Codes Code Description E11.65 Type 2 diabetes mellitus with hyperglycemia E11.621 Type 2 diabetes mellitus with foot ulcer L97.524 Non-pressure chronic ulcer of other part of left foot with necrosis of bone M86.179 Other acute osteomyelitis, unspecified ankle and foot Facility Procedures CPT4 Code: [id number] Description: [id number] - DEB SUBQ TISSUE 20 SQ CM/< ICD-10 Diagnosis Description L97.524 Non-pressure chronic ulcer of other part of left foot with necrosis of bone Modifier: Quantity: 1 CPT4 Code: [id number] Description: [id number] - DEB SUBQ TISS  EA ADDL 20CM ICD-10 Diagnosis Description L97.524 Non-pressure chronic ulcer of other part of left foot with necrosis of bone Modifier: Quantity: 6 Physician Procedures : CPT4 Code Description Modifier [id number] - WC PHYS LEVEL 4 - EST PT 25 ICD-10 Diagnosis Description L97.524 Non-pressure chronic ulcer of other part of left foot with necrosis of bone M86.179 Other acute osteomyelitis, unspecified ankle and foot  E11.621 Type 2 diabetes mellitus with foot ulcer E11.65 Type 2 diabetes mellitus with hyperglycemia Quantity: 1 : [id number] - WC PHYS SUBQ TISS 20 SQ CM 1 ICD-10 Diagnosis Description L97.524 Non-pressure chronic ulcer of other part of left foot with necrosis of bone Quantity: : [id number] 11045 - WC PHYS SUBQ TISS EA ADDL 20 CM 6 ICD-10 Diagnosis Description L97.524 Non-pressure chronic ulcer of other part of left foot with necrosis of bone Quantity: Electronic Signature(s) Signed: 06/05/2022 11:38:11 AM By: Fredirick Maudlin MD FACS Entered By: Fredirick Maudlin on 06/05/2022 11:38:10

## 2022-06-09 ENCOUNTER — Ambulatory Visit (HOSPITAL_COMMUNITY)
Admission: EM | Admit: 2022-06-09 | Discharge: 2022-06-09 | Disposition: A | Discharge status: Home / Self Care | Payer: 59 | Attending: General Surgery | Admitting: General Surgery

## 2022-06-09 ENCOUNTER — Emergency Department (HOSPITAL_COMMUNITY)
Admission: EM | Admit: 2022-06-09 | Discharge: 2022-06-09 | Discharge status: Absent without leave | Payer: 59 | Source: Home / Self Care

## 2022-06-09 ENCOUNTER — Other Ambulatory Visit (HOSPITAL_COMMUNITY): Payer: Self-pay | Admitting: General Surgery

## 2022-06-09 DIAGNOSIS — E119 Type 2 diabetes mellitus without complications: Secondary | ICD-10-CM | POA: Diagnosis not present

## 2022-06-09 DIAGNOSIS — L97524 Non-pressure chronic ulcer of other part of left foot with necrosis of bone: Secondary | ICD-10-CM

## 2022-06-09 NOTE — ED Notes (Signed)
Pt registered by mistake. Pt has order for outpatient X-ray

## 2022-06-10 ENCOUNTER — Ambulatory Visit (HOSPITAL_COMMUNITY): Payer: 59

## 2022-06-10 DIAGNOSIS — L97502 Non-pressure chronic ulcer of other part of unspecified foot with fat layer exposed: Secondary | ICD-10-CM | POA: Diagnosis not present

## 2022-06-10 DIAGNOSIS — E11621 Type 2 diabetes mellitus with foot ulcer: Secondary | ICD-10-CM | POA: Diagnosis not present

## 2022-06-12 ENCOUNTER — Telehealth: Payer: Self-pay | Admitting: Orthopedic Surgery

## 2022-06-12 ENCOUNTER — Ambulatory Visit (HOSPITAL_BASED_OUTPATIENT_CLINIC_OR_DEPARTMENT_OTHER): Payer: 59 | Admitting: General Surgery

## 2022-06-12 DIAGNOSIS — M86172 Other acute osteomyelitis, left ankle and foot: Secondary | ICD-10-CM | POA: Diagnosis not present

## 2022-06-12 NOTE — Telephone Encounter (Signed)
Pt called for refills of levofloxacin and mettronidozole. Please send to [facility] on [address]. Pt phone number is [phone].

## 2022-06-12 NOTE — Telephone Encounter (Signed)
Looking through d/c notes, metronidazole given #72 tabs to last through 7/14 when he is done with IV abx, and levaquin given to him on 05/12/22 to last 42 doses, end date 06/23/22, need to check with how many he has left.

## 2022-06-13 ENCOUNTER — Encounter (HOSPITAL_BASED_OUTPATIENT_CLINIC_OR_DEPARTMENT_OTHER): Payer: 59 | Admitting: General Surgery

## 2022-06-13 ENCOUNTER — Ambulatory Visit (HOSPITAL_COMMUNITY)
Admission: RE | Admit: 2022-06-13 | Discharge: 2022-06-13 | Disposition: A | Discharge status: Home / Self Care | Payer: 59 | Source: Ambulatory Visit | Attending: Family Medicine | Admitting: Family Medicine

## 2022-06-13 DIAGNOSIS — Z9289 Personal history of other medical treatment: Secondary | ICD-10-CM | POA: Diagnosis not present

## 2022-06-14 ENCOUNTER — Other Ambulatory Visit: Payer: Self-pay | Admitting: *Deleted

## 2022-06-14 DIAGNOSIS — M25569 Pain in unspecified knee: Secondary | ICD-10-CM

## 2022-06-14 NOTE — Telephone Encounter (Signed)
Pt informed. He said they are coming to take out PICC line on 06/16/22

## 2022-06-15 ENCOUNTER — Telehealth: Payer: Self-pay

## 2022-06-15 ENCOUNTER — Encounter: Payer: 59 | Attending: Physician Assistant | Admitting: Physician Assistant

## 2022-06-15 DIAGNOSIS — E1169 Type 2 diabetes mellitus with other specified complication: Secondary | ICD-10-CM | POA: Insufficient documentation

## 2022-06-15 DIAGNOSIS — F17200 Nicotine dependence, unspecified, uncomplicated: Secondary | ICD-10-CM | POA: Diagnosis not present

## 2022-06-15 DIAGNOSIS — E11621 Type 2 diabetes mellitus with foot ulcer: Secondary | ICD-10-CM | POA: Diagnosis not present

## 2022-06-15 DIAGNOSIS — L97524 Non-pressure chronic ulcer of other part of left foot with necrosis of bone: Secondary | ICD-10-CM | POA: Insufficient documentation

## 2022-06-15 DIAGNOSIS — R69 Illness, unspecified: Secondary | ICD-10-CM | POA: Diagnosis not present

## 2022-06-15 DIAGNOSIS — M86672 Other chronic osteomyelitis, left ankle and foot: Secondary | ICD-10-CM | POA: Insufficient documentation

## 2022-06-15 NOTE — Telephone Encounter (Signed)
SW pt, suggested he try aleve 2 tab BID along with oxycodone regimen. Elevate foot and leg above chest level through out the day. Stay off foot as much as possible. Use ACE bandage for compression. Denny Peon do you suggest anything else?

## 2022-06-15 NOTE — Progress Notes (Signed)
[name], [name] ([id number]) Visit Report for 06/15/2022 HBO Patient Questionnaire Details Patient Name: [name], [name] Date of Service: 06/15/2022 8:45 AM Medical Record Number: [id number] Patient Account Number: [id number] Date of Birth/Sex: 10-26-1966 (56 y.o. M) Treating RN: Yevonne Pax Primary Care Chip Canepa: PATIENT, NO Other Clinician: Referring Anarely Nicholls: Duanne Guess Treating Nile Prisk/Extender: Rowan Blase in Treatment: 0 HBO Patient Questionnaire Items Answer Any "Yes" answers must be brought to the hyperbaric physician's attention. Breathing or Lung problemso No Currently use tobacco productso No Used tobacco products in the pasto Yes Heart problemso No Do you take water pills (diuretic)o No Diabeteso Yes On Diabetes pillo Yes On Insulino Yes Dialysiso No Eye problems like glaucomao No Ear problems or surgeryo No Sinus Problemso No Cancero No Confinement Anxiety (Claustrophobia- fear of confined places)o Yes Any medical implants/devices that are fully or partially implanted or attached to your bodyo No Pregnanto No Seizureso No Date of last: Chest x-ray: 06/09/2022 EKG: 06/15/2022 CBC: 05/28/2022 Electronic Signature(s) Signed: 06/15/2022 12:43:56 PM By: Yevonne Pax RN Entered By: Yevonne Pax on 06/15/2022 12:43:55

## 2022-06-15 NOTE — Telephone Encounter (Signed)
Patient called into the office stating that he is taking hydrocodone but it isn't touching the pain.  He stated he has about 5 left but he doesn't want to continue taking them if they are not helping.  Any advise on what he can do ?

## 2022-06-15 NOTE — Telephone Encounter (Signed)
Patient called office stating he does not have anymore Levaquin and Flagyl. Would like to know if he needs refills sent to his pharmacy. Is scheduled to finish IV antibiotics 7/14. Follow up appointment is not until 7/18. Will forward message to MD to advise if refills are needed. If so will need [facility] [address]. [name], RMA

## 2022-06-16 NOTE — Progress Notes (Signed)
[name] ([id number]) Visit Report for 06/15/2022 Abuse Risk Screen Details Patient Name: [name], [name] Date of Service: 06/15/2022 8:45 AM Medical Record Number: [id number] Patient Account Number: [id number] Date of Birth/Sex: January 23, 1966 (56 y.o. M) Treating RN: Yevonne Pax Primary Care Verlinda Slotnick: PATIENT, NO Other Clinician: Referring Meliyah Simon: Duanne Guess Treating Geet Hosking/Extender: Rowan Blase in Treatment: 0 Abuse Risk Screen Items Answer ABUSE RISK SCREEN: Has anyone close to you tried to hurt or harm you recentlyo No Do you feel uncomfortable with anyone in your familyo No Has anyone forced you do things that you didnot want to doo No Electronic Signature(s) Signed: 06/16/2022 10:45:13 AM By: Yevonne Pax RN Entered By: Yevonne Pax on 06/15/2022 09:12:39 [name] ([id number]) -------------------------------------------------------------------------------- Activities of Daily Living Details Patient Name: [name] Date of Service: 06/15/2022 8:45 AM Medical Record Number: [id number] Patient Account Number: [id number] Date of Birth/Sex: [birth year] (56 y.o. M) Treating RN: Yevonne Pax Primary Care Marshia Tropea: PATIENT, NO Other Clinician: Referring Piper Albro: Duanne Guess Treating Delberta Folts/Extender: Rowan Blase in Treatment: 0 Activities of Daily Living Items Answer Activities of Daily Living (Please select one for each item) Drive Automobile Completely Able Take Medications Completely Able Use Telephone Completely Able Care for Appearance Completely Able Use Toilet Completely Able Bath / Shower Completely Able Dress Self Completely Able Feed Self Completely Able Walk Completely Able Get In / Out Bed Completely Able Housework Completely Able Prepare Meals Completely Able Handle Money Completely Able Shop for Self Completely Able Electronic Signature(s) Signed: 06/16/2022 10:45:13 AM By: Yevonne Pax RN Entered By: Yevonne Pax on 06/15/2022  09:13:08 [name] ([id number]) -------------------------------------------------------------------------------- Education Screening Details Patient Name: [name] Date of Service: 06/15/2022 8:45 AM Medical Record Number: [id number] Patient Account Number: [id number] Date of Birth/Sex: 1966-04-08 (56 y.o. M) Treating RN: Yevonne Pax Primary Care Presley Gora: PATIENT, NO Other Clinician: Referring Tjay Velazquez: Duanne Guess Treating Brandom Kerwin/Extender: Rowan Blase in Treatment: 0 Primary Learner Assessed: Patient Learning Preferences/Education Level/Primary Language Highest Education Level: High School Preferred Language: English Cognitive Barrier Language Barrier: No Translator Needed: No Memory Deficit: No Emotional Barrier: No Cultural/Religious Beliefs Affecting Medical Care: No Physical Barrier Impaired Vision: No Impaired Hearing: No Decreased Hand dexterity: No Knowledge/Comprehension Knowledge Level: Medium Comprehension Level: High Ability to understand written instructions: High Ability to understand verbal instructions: High Motivation Anxiety Level: Anxious Cooperation: Cooperative Education Importance: Acknowledges Need Interest in Health Problems: Asks Questions Perception: Coherent Willingness to Engage in Self-Management High Activities: Readiness to Engage in Self-Management High Activities: Electronic Signature(s) Signed: 06/16/2022 10:45:13 AM By: Yevonne Pax RN Entered By: Yevonne Pax on 06/15/2022 09:14:12 [name] ([id number]) -------------------------------------------------------------------------------- Fall Risk Assessment Details Patient Name: [name] Date of Service: 06/15/2022 8:45 AM Medical Record Number: [id number] Patient Account Number: [id number] Date of Birth/Sex: 06-Mar-1966 (56 y.o. M) Treating RN: Yevonne Pax Primary Care Esha Fincher: PATIENT, NO Other Clinician: Referring Melessa Cowell: Duanne Guess Treating  Shealynn Saulnier/Extender: Rowan Blase in Treatment: 0 Fall Risk Assessment Items Have you had 2 or more falls in the last 12 monthso 0 No Have you had any fall that resulted in injury in the last 12 monthso 0 No FALLS RISK SCREEN History of falling - immediate or within 3 months 0 No Secondary diagnosis (Do you have 2 or more medical diagnoseso) 0 No Ambulatory aid None/bed rest/wheelchair/nurse 0 No Crutches/cane/walker 0 No Furniture 0 No Intravenous therapy Access/Saline/Heparin Lock 0 No Gait/Transferring Normal/ bed rest/ wheelchair 0 No Weak (short steps with or without shuffle, stooped but able to lift head while walking, may  0 No seek support from furniture) Impaired (short steps with shuffle, may have difficulty arising from chair, head down, impaired 0 No balance) Mental Status Oriented to own ability 0 No Electronic Signature(s) Signed: 06/16/2022 10:45:13 AM By: Yevonne Pax RN Entered By: Yevonne Pax on 06/15/2022 09:14:26 [name] ([id number]) -------------------------------------------------------------------------------- Foot Assessment Details Patient Name: [name] Date of Service: 06/15/2022 8:45 AM Medical Record Number: [id number] Patient Account Number: [id number] Date of Birth/Sex: October 13, 1966 (56 y.o. M) Treating RN: Yevonne Pax Primary Care [place] Fassnacht: PATIENT, NO Other Clinician: Referring Legaci Tarman: Duanne Guess Treating Enna Warwick/Extender: Rowan Blase in Treatment: 0 Foot Assessment Items Site Locations + = Sensation present, - = Sensation absent, C = Callus, U = Ulcer R = Redness, W = Warmth, M = Maceration, PU = Pre-ulcerative lesion F = Fissure, S = Swelling, D = Dryness Assessment Right: Left: Other Deformity: No No Prior Foot Ulcer: No No Prior Amputation: No No Charcot Joint: No No Ambulatory Status: Ambulatory Without Help Gait: Steady Electronic Signature(s) Signed: 06/16/2022 10:45:13 AM By: Yevonne Pax RN Entered By:  Yevonne Pax on 06/15/2022 09:24:15 [name] ([id number]) -------------------------------------------------------------------------------- Nutrition Risk Screening Details Patient Name: [name] Date of Service: 06/15/2022 8:45 AM Medical Record Number: [id number] Patient Account Number: [id number] Date of Birth/Sex: 23-Nov-1966 (56 y.o. M) Treating RN: Yevonne Pax Primary Care Lyndell Allaire: PATIENT, NO Other Clinician: Referring Gwenn Teodoro: Duanne Guess Treating Dyanne Yorks/Extender: Rowan Blase in Treatment: 0 Height (in): 70 Weight (lbs): 230 Body Mass Index (BMI): 33 Nutrition Risk Screening Items Score Screening NUTRITION RISK SCREEN: I have an illness or condition that made me change the kind and/or amount of food I eat 0 No I eat fewer than two meals per day 0 No I eat few fruits and vegetables, or milk products 0 No I have three or more drinks of beer, liquor or wine almost every day 0 No I have tooth or mouth problems that make it hard for me to eat 0 No I don't always have enough money to buy the food I need 0 No I eat alone most of the time 0 No I take three or more different prescribed or over-the-counter drugs a day 1 Yes Without wanting to, I have lost or gained 10 pounds in the last six months 0 No I am not always physically able to shop, cook and/or feed myself 0 No Nutrition Protocols Good Risk Protocol 0 No interventions needed Moderate Risk Protocol High Risk Proctocol Risk Level: Good Risk Score: 1 Electronic Signature(s) Signed: 06/16/2022 10:45:13 AM By: Yevonne Pax RN Entered By: Yevonne Pax on 06/15/2022 09:15:01

## 2022-06-16 NOTE — Telephone Encounter (Signed)
Called spoke with patient. States has enough oxycodone. Feels things going well. Describes pain as "intermittent, not that bad, feels like vessels are "regrowing'"

## 2022-06-16 NOTE — Progress Notes (Signed)
[name] ([id number]) Visit Report for 06/15/2022 Chief Complaint Document Details Patient Name: [name], [name] Date of Service: 06/15/2022 8:45 AM Medical Record Number: [id number] Patient Account Number: [id number] Date of Birth/Sex: [birth year] (56 y.o. 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 M) Treating RN: Carlene Coria Primary Care Provider: PATIENT, NO Other Clinician: Referring Provider: Fredirick Maudlin Treating Provider/Extender: Skipper Cliche in Treatment: 0 Debridement Performed for Wound #1 Plantar Foot Assessment: Performed By: Physician Tommie Sams., PA-C Debridement Type: Chemical/Enzymatic/Mechanical Agent Used: saline gauze Severity of Tissue Pre Debridement: Necrosis of bone Level of Consciousness (Pre- Awake and Alert procedure): Pre-procedure Verification/Time Out Yes - 09:30 Taken: Start Time: 09:30 Instrument: Other : saline gauze Bleeding: None End Time: 09:33 Procedural Pain: 0 Post Procedural Pain: 0 Response to Treatment: Procedure was tolerated well Level of Consciousness (Post- Awake and Alert procedure): Post Debridement Measurements of Total Wound Length: (cm) 16 Width: (cm) 8 Depth:  (cm) 0.5 Volume: (cm) 50.265 Character of Wound/Ulcer Post Debridement: Improved Severity of Tissue Post Debridement: Fat layer exposed Post Procedure Diagnosis Same as Pre-procedure Electronic Signature(s) Signed: 06/15/2022 11:49:56 AM By: Carlene Coria RN Signed: 06/15/2022 4:35:29 PM By: Worthy Keeler PA-C Entered By: Carlene Coria on 06/15/2022 11:49:56 [name] ([id number]) -------------------------------------------------------------------------------- HPI Details Patient Name: [name] Date of Service: 06/15/2022 8:45 AM Medical Record Number: [id number] Patient Account Number: [id number] Date of Birth/Sex: 01-Sep-1966 (56 y.o. M) Treating RN: Carlene Coria Primary Care Provider: PATIENT, NO Other Clinician: Referring Provider: Fredirick Maudlin Treating Provider/Extender: Skipper Cliche in Treatment: 0 History of Present Illness HPI Description: Notes from [facility] under Dr. Glenford Peers care:   ADMISSION6/15/2023This is a 76 year old poorly controlled type II diabetic (last A1c 11.5%). In January of this year, he presented to the hospital with an abscess in his left foot. He also had a foreign body present. It was removed in the ER and he was admitted to the hospital for IV antibiotics. He was subsequently discharged on oral antibiotics. Due to financial concerns, he has not taken his diabetic medications (metformin and 70/30 insulin) and as a result, presented back to the emergency room on June 1 with worsening findings, including frank pus and osteomyelitis on MRI. BKA was recommended, but he declined. He was taken to the operating room by Dr. Sharol Given who performed an extensive debridement. Cultures were taken and he was placed on IV antibiotics. He saw infectious disease while in the hospital who prescribed a 6-week course of IV antibiotics including cefazolin as well as oral metronidazole and levofloxacin. The infectious disease provider also indicated that  he would benefit from below-knee amputation, but the patient desired another opinion and therefore he has been referred to wound care center for further evaluation and management. ABIs obtained while he was in the hospital demonstrate adequate blood flow. Essentially the entire bottom of the patient's left foot has been removed. The heads of the majority of his metatarsal bones are exposed along  with their accompanying tendons. Muscle and fat are exposed as well. The wound surface is fairly dry; the patient reports that he was not given any specific wound care instructions other than to place a dry dressing on it after showering. There is no odor or purulent drainage identified. 05/26/2022: The wound measures a little bit smaller today. There is some good granulation tissue beginning to form on the surface. He still has exposed bone and tendon. There is some slough accumulation. He saw infectious disease earlier this week and was frustrated that they recommended amputation. He is interested in another surgical opinion. He continues to smoke but says that he has cut down. He is using a knee scooter for mobility so that he can stay off of his foot. 06/05/2022: He saw Dr. Sharol Given last week who was supportive of our efforts to try to salvage the foot. I referred him to vascular surgery to see Dr. Carlis Abbott but he has not received an appointment yet. He continues on IV antibiotics. He says that after our conversation last week about him being unable to initiate hyperbarics if he was going to continue to smoke, he has not had a cigarette since. He is now at 4 weeks of conventional management and his insurance was initiated on July 1. Today, the wound continues to show evidence of improvement. There is good granulation tissue forming. The metatarsals are still exposed but actually have some pink periosteum. There is still some slough and fibrinous exudate present. No odor or purulent drainage. We have just been using  Dakin's dressing changes for now. Admission to [facility]: 06-15-2022 upon evaluation today patient presents for initial inspection here in our clinic though he has been seen by Dr. Celine Ahr in [place] up to this point. I did review her notes and records as well they have been using Dakin's moistened gauze dressing which has done a great job at helping to clean out the bottom of his foot. Please see the discourse above for additional information in regard to treatment course up until the patient arrives here in the [facility] clinic today. The patient is ready to get started with HBO therapy as soon as possible I do think based on all my review of his records this seems to be appropriate he does have chronic osteomyelitis is also been offered amputation this is obviously a limb salvage operation here. We are trying to prevent him from having to undergo an extensive amputation which would likely be a below-knee amputation which in turn is going to affect his quality of life he wants to try to avoid that if at all possible I think hyperbaric oxygen therapy is his best bet to achieve that goal. Currently he has been on IV Ancef. That actually ends tomorrow. Following that according to notes it appears he supposed to be taking Levaquin and Flagyl for an additional period of time although I am not certain exactly how long that with the. He has been seeing regional physicians infectious disease in [place] and again that so I recommended that he contact today in order to find out what the plan is going forward. His most recent hemoglobin A1c as noted above was 11.5 on 05-07-2022 he tells me trying to work on that he is also quit smoking as of this point. Electronic Signature(s) Signed: 06/15/2022 4:10:19 PM By: Worthy Keeler PA-C Entered By: Worthy Keeler on 06/15/2022 16:10:19 [name]  ([id number]) -------------------------------------------------------------------------------- Physical Exam Details Patient Name: [name] Date  of Service: 06/15/2022 8:45 AM Medical Record Number: [id number] Patient Account Number: [id number] Date of Birth/Sex: 01-14-1966 (56 y.o. M) Treating RN: Carlene Coria Primary Care Provider: PATIENT, NO Other Clinician: Referring Provider: Fredirick Maudlin Treating Provider/Extender: Skipper Cliche in Treatment: 0 Constitutional patient is hypertensive.. pulse regular and within target range for patient.Marland Kitchen respirations regular, non-labored and within target range for patient.Marland Kitchen temperature within target range for patient.. Well-nourished and well-hydrated in no acute distress. Eyes conjunctiva clear no eyelid edema noted. pupils equal round and reactive to light and accommodation. Ears, Nose, Mouth, and Throat no gross abnormality of ear auricles or external auditory canals. normal hearing noted during conversation. mucus membranes moist. Respiratory normal breathing without difficulty. Musculoskeletal Patient is currently utilizing a knee scooter which is absolutely the right thing to be doing.. no significant deformity or arthritic changes, no loss or range of motion, no clubbing. Psychiatric this patient is able to make decisions and demonstrates good insight into disease process. Alert and Oriented x 3. pleasant and cooperative. Notes Upon inspection patient's wound does show signs of metatarsal on the what appears to be third and fourth locations possibly a little bit of the second metatarsal head which is exposed but there is a lot of granulation tissue filling in we did seem to make sure that we keep these bones covered with something like oil emulsion or Adaptic in order to prevent it from drying out otherwise I Georgina Peer continue with the Dakin's today based on what I am seeing. I do think he could be a candidate for [place] as well.  Working to see how things do over the next week. Electronic Signature(s) Signed: 06/15/2022 4:34:11 PM By: Worthy Keeler PA-C Entered By: Worthy Keeler on 06/15/2022 16:34:11 [name] ([id number]) -------------------------------------------------------------------------------- Physician Orders Details Patient Name: [name] Date of Service: 06/15/2022 8:45 AM Medical Record Number: [id number] Patient Account Number: [id number] Date of Birth/Sex: Oct 29, 1966 (56 y.o. M) Treating RN: Carlene Coria Primary Care Provider: PATIENT, NO Other Clinician: Referring Provider: Fredirick Maudlin Treating Provider/Extender: Skipper Cliche in Treatment: 0 Verbal / Phone Orders: No Diagnosis Coding ICD-10 Coding Code Description [phone] Other chronic osteomyelitis, left ankle and foot E11.621 Type 2 diabetes mellitus with foot ulcer L97.524 Non-pressure chronic ulcer of other part of left foot with necrosis of bone Follow-up Appointments o Return Appointment in 1 week. Bathing/ Shower/ Hygiene o May shower; gently cleanse wound with antibacterial soap, rinse and pat dry prior to dressing wounds Edema Control - Lymphedema / Segmental Compressive Device / Other o Elevate, Exercise Daily and Avoid Standing for Long Periods of Time. o Elevate legs to the level of the heart and pump ankles as often as possible o Elevate leg(s) parallel to the floor when sitting. Hyperbaric Oxygen Therapy o Evaluate for HBO Therapy o Indication and location: - osteomyelitis left foot o 2.5 ATA for 90 Minutes with 2 Five (5) Minute Air Breaks - total 40 treatments o Total # of Treatments: - 40 o Antihistamine 30 minutes prior to HBO Treatment, difficulty clearing ears. o Finger stick Blood Glucose Pre- and Post- HBOT Treatment. o Follow Hyperbaric Oxygen Glycemia Protocol Wound Treatment Wound #1 - Foot Wound Laterality: Plantar Cleanser: Byram Ancillary Kit - 15 Day Supply (Generic) 1  x Per Day/30 Days Discharge Instructions: Use supplies as instructed; Kit contains: (15) Saline Bullets; (15) 3x3 Gauze; 15 pr Gloves Cleanser: Dakin 16 (oz) 0.25 1 x Per Day/30 Days Discharge Instructions: Use as directed. Primary Dressing: Gauze (Generic) 1 x  Per Day/30 Days Discharge Instructions: moistened with 1/4 Dakins Solution Primary Dressing: [employer] Oil Emulsion Dressings, 3x8 (in/in) (Generic) 1 x Per Day/30 Days Discharge Instructions: cover exposed bone Secondary Dressing: ABD Pad 5x9 (in/in) (Generic) 1 x Per Day/30 Days Discharge Instructions: Cover with ABD pad Secondary Dressing: Kerlix 4.5 x 4.1 (in/yd) (Generic) 1 x Per Day/30 Days Discharge Instructions: Apply Kerlix 4.5 x 4.1 (in/yd) as instructed Secured With: Medipore Tape - 74M Medipore H Soft Cloth Surgical Tape, 2x2 (in/yd) (Generic) 1 x Per Day/30 Days GLYCEMIA INTERVENTIONS PROTOCOL PRE-HBO GLYCEMIA INTERVENTIONS ACTION INTERVENTION Obtain pre-HBO capillary blood glucose (ensure 1 physician order is in chart). SAAB, Mana ([id number]) 2 If result is 70 mg/dl or below: A. Notify HBO physician and await physician orders. B. If the result meets the hospital definition of a critical result, follow hospital policy. A. Give patient an 8 ounce Glucerna Shake, an 8 ounce Ensure, or 8 ounces of a Glucerna/Ensure equivalent dietary supplement*. B. Wait 30 minutes. If result is 71 mg/dl to 130 mg/dl: C. Retest patientos capillary blood glucose (CBG). D. If result greater than or equal to 110 mg/dl, proceed with HBO. If result less than 110 mg/dl, notify HBO physician and consider holding HBO. If result is 131 mg/dl to 249 mg/dl: A. Proceed with HBO. A. Notify HBO physician and await physician orders. B. It is recommended to hold HBO and do If result is 250 mg/dl or greater: blood/urine ketone testing. C. If the result meets the hospital definition of a critical result, follow hospital policy. POST-HBO  GLYCEMIA INTERVENTIONS ACTION INTERVENTION Obtain post HBO capillary blood glucose (ensure 1 physician order is in chart). A. Notify HBO physician and await physician orders. 2 If result is 70 mg/dl or below: B. If the result meets the hospital definition of a critical result, follow hospital policy. A. Give patient an 8 ounce Glucerna Shake, an 8 ounce Ensure, or 8 ounces of a Glucerna/Ensure equivalent dietary supplement*. B. Wait 15 minutes for symptoms of hypoglycemia (i.e. nervousness, anxiety, If result is 71 mg/dl to 100 mg/dl: sweating, chills, clamminess, irritability, confusion, tachycardia or dizziness). C. If patient asymptomatic, discharge patient. If patient symptomatic, repeat capillary blood glucose (CBG) and notify HBO physician. If result is 101 mg/dl to 249 mg/dl: A. Discharge patient. A. Notify HBO physician and await physician orders. B. It is recommended to do blood/urine If result is 250 mg/dl or greater: ketone testing. C. If the result meets the hospital definition of a critical result, follow hospital policy. *Juice or candies are NOT equivalent products. If patient refuses the Glucerna or Ensure, please consult the hospital dietitian for an appropriate substitute. Electronic Signature(s) Signed: 06/15/2022 4:35:29 PM By: Worthy Keeler PA-C Signed: 06/16/2022 10:45:13 AM By: Carlene Coria RN Entered By: Carlene Coria on 06/15/2022 11:52:21 DIRON, HADDON ([id number]) -------------------------------------------------------------------------------- Problem List Details Patient Name: [name] Date of Service: 06/15/2022 8:45 AM Medical Record Number: [id number] Patient Account Number: [id number] Date of Birth/Sex: 1966-05-05 (56 y.o. M) Treating RN: Carlene Coria Primary Care Provider: PATIENT, NO Other Clinician: Referring Provider: Fredirick Maudlin Treating Provider/Extender: Skipper Cliche in Treatment: 0 Active  Problems ICD-10 Encounter Code Description Active Date MDM Diagnosis [phone] Other chronic osteomyelitis, left ankle and foot 06/15/2022 No Yes E11.621 Type 2 diabetes mellitus with foot ulcer 06/15/2022 No Yes L97.524 Non-pressure chronic ulcer of other part of left foot with necrosis of 06/15/2022 No Yes bone Inactive Problems Resolved Problems Electronic Signature(s) Signed: 06/15/2022 9:44:00 AM By: Worthy Keeler PA-C Entered  By: Worthy Keeler on 06/15/2022 09:44:00 [name] ([id number]) -------------------------------------------------------------------------------- Progress Note Details Patient Name: ZYLON, CREAMER Date of Service: 06/15/2022 8:45 AM Medical Record Number: [id number] Patient Account Number: [id number] Date of Birth/Sex: Mar 28, 1966 (56 y.o. 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 Glenford Peers care:   ADMISSION6/15/2023This is a 70 year old poorly controlled type II diabetic (last A1c 11.5%). In January of this year, he presented to the hospital with an abscess in his left foot. He also had a foreign body present. It was removed in the ER and he was admitted to the hospital for IV antibiotics. He was subsequently discharged on oral antibiotics. Due to financial concerns, he has not taken his diabetic medications (metformin and 70/30 insulin) and as a result, presented back to the emergency room on June 1 with worsening findings, including frank pus and osteomyelitis on MRI. BKA was recommended, but he declined. He was taken to the operating room by Dr. Sharol Given who performed an extensive debridement. Cultures were  taken and he was placed on IV antibiotics. He saw infectious disease while in the hospital who prescribed a 6-week course of IV antibiotics including cefazolin as well as oral metronidazole and levofloxacin. The infectious disease provider also indicated that he would benefit from below-knee amputation, but the patient desired another opinion and therefore he has been referred to wound care center for further evaluation and management. ABIs obtained while he was in the hospital demonstrate adequate blood flow. Essentially the entire bottom of the patient's left foot has been removed. The heads of the majority of his metatarsal bones are exposed along with their accompanying tendons. Muscle and fat are exposed as well. The wound surface is fairly dry; the patient reports that he was not given any specific wound care instructions other than to place a dry dressing on it after showering. There is no odor or purulent drainage identified. 05/26/2022: The wound measures a little bit smaller today. There is some good granulation tissue beginning to form on the surface. He still has exposed bone and tendon. There is some slough accumulation. He saw infectious disease earlier this week and was frustrated that they recommended amputation. He is interested in another surgical opinion. He continues to smoke but says that he has cut down. He is using a knee scooter for mobility so that he can stay off of his foot. 06/05/2022: He saw Dr. Sharol Given last week who was supportive of our efforts to try to salvage the foot. I referred him to vascular surgery to see Dr. Carlis Abbott but he has not received an appointment yet. He continues on IV antibiotics. He says that after our conversation last week about him being unable to initiate hyperbarics if he was going to continue to smoke, he has not had a cigarette since. He is now at 4 weeks of conventional management and his insurance was initiated on July 1. Today, the wound continues  to show evidence of improvement. There is good granulation tissue forming. The metatarsals are still exposed but actually have some pink periosteum. There is still some slough and fibrinous exudate present. No odor or purulent drainage. We have just been using Dakin's dressing changes for now. Admission to [facility]: 06-15-2022 upon  evaluation today patient presents for initial inspection here in our clinic though he has been seen by Dr. Celine Ahr in [place] up to this point. I did review her notes and records as well they have been using Dakin's moistened gauze dressing which has done a great job at helping to clean out the bottom of his foot. Please see the discourse above for additional information in regard to treatment course up until the patient arrives here in the [facility] clinic today. The patient is ready to get started with HBO therapy as soon as possible I do think based on all my review of his records this seems to be appropriate he does have chronic osteomyelitis is also been offered amputation this is obviously a limb salvage operation here. We are trying to prevent him from having to undergo an extensive amputation which would likely be a below-knee amputation which in turn is going to affect his quality of life he wants to try to avoid that if at all possible I think hyperbaric oxygen therapy is his best bet to achieve that goal. Currently he has been on IV Ancef. That actually ends tomorrow. Following that according to notes it appears he supposed to be taking Levaquin and Flagyl for an additional period of time although I am not certain exactly how long that with the. He has been seeing regional physicians infectious disease in [place] and again that so I recommended that he contact today in order to find out what the plan is going forward. His most recent hemoglobin A1c as noted above was 11.5 on 05-07-2022 he tells me trying to work on that he is also quit smoking  as of this point. Patient History Information obtained from Patient. Allergies No Known Allergies Social History Former smoker, Marital Status - Single, Alcohol Use - Never, Drug Use - No History, Caffeine Use - Rarely. Medical History Endocrine Patient has history of Type II Diabetes OHERN, Lin ([id number]) Patient is treated with Insulin, Oral Agents. Blood sugar is tested. Blood sugar results noted at the following times: Breakfast - 180. Review of Systems (ROS) Integumentary (Skin) Complains or has symptoms of Wounds. Objective Constitutional patient is hypertensive.. pulse regular and within target range for patient.Marland Kitchen respirations regular, non-labored and within target range for patient.Marland Kitchen temperature within target range for patient.. Well-nourished and well-hydrated in no acute distress. Vitals Time Taken: 9:08 AM, Height: 70 in, Source: Stated, Weight: 230 lbs, Source: Stated, BMI: 33, Temperature: 98.3 F, Pulse: 89 bpm, Respiratory Rate: 18 breaths/min, Blood Pressure: 153/91 mmHg. Eyes conjunctiva clear no eyelid edema noted. pupils equal round and reactive to light and accommodation. Ears, Nose, Mouth, and Throat no gross abnormality of ear auricles or external auditory canals. normal hearing noted during conversation. mucus membranes moist. Respiratory normal breathing without difficulty. Musculoskeletal Patient is currently utilizing a knee scooter which is absolutely the right thing to be doing.. no significant deformity or arthritic changes, no loss or range of motion, no clubbing. Psychiatric this patient is able to make decisions and demonstrates good insight into disease process. Alert and Oriented x 3. pleasant and cooperative. General Notes: Upon inspection patient's wound does show signs of metatarsal on the what appears to be third and fourth locations possibly a little bit of the second metatarsal head which is exposed but there is a lot of granulation tissue  filling in we did seem to make sure that we keep these bones covered with something like oil emulsion or Adaptic in order to prevent it from  drying out otherwise I Georgina Peer continue with the Dakin's today based on what I am seeing. I do think he could be a candidate for [place] as well. Working to see how things do over the next week. Integumentary (Hair, Skin) Wound #1 status is Open. Original cause of wound was Gradually Appeared. The date acquired was: 12/04/2021. The wound is located on the Plantar Foot. The wound measures 16cm length x 8cm width x 0.5cm depth; 100.531cm^2 area and 50.265cm^3 volume. There is bone, muscle, tendon, Fat Layer (Subcutaneous Tissue), and fascia exposed. There is no tunneling or undermining noted. There is a medium amount of serosanguineous drainage noted. There is large (67-100%) red granulation within the wound bed. There is a small (1-33%) amount of necrotic tissue within the wound bed including Adherent Slough and Necrosis of Muscle. Assessment Active Problems ICD-10 Other chronic osteomyelitis, left ankle and foot Type 2 diabetes mellitus with foot ulcer Non-pressure chronic ulcer of other part of left foot with necrosis of bone Procedures Wound #1 Pre-procedure diagnosis of Wound #1 is a Diabetic Wound/Ulcer of the Lower Extremity located on the Plantar Foot .Severity of Tissue Pre Debridement is: Necrosis of bone. There was a Chemical/Enzymatic/Mechanical debridement performed by Tommie Sams., PA-C. With the [place], [place] ([id number]) following instrument(s): saline gauze. Other agent used was saline gauze. A time out was conducted at 09:30, prior to the start of the procedure. There was no bleeding. The procedure was tolerated well with a pain level of 0 throughout and a pain level of 0 following the procedure. Post Debridement Measurements: 16cm length x 8cm width x 0.5cm depth; 50.265cm^3 volume. Character of Wound/Ulcer Post Debridement is improved.  Severity of Tissue Post Debridement is: Fat layer exposed. Post procedure Diagnosis Wound #1: Same as Pre-Procedure Plan Follow-up Appointments: Return Appointment in 1 week. Bathing/ Shower/ Hygiene: May shower; gently cleanse wound with antibacterial soap, rinse and pat dry prior to dressing wounds Edema Control - Lymphedema / Segmental Compressive Device / Other: Elevate, Exercise Daily and Avoid Standing for Long Periods of Time. Elevate legs to the level of the heart and pump ankles as often as possible Elevate leg(s) parallel to the floor when sitting. Hyperbaric Oxygen Therapy: Evaluate for HBO Therapy Indication and location: - osteomyelitis left foot 2.5 ATA for 90 Minutes with 2 Five (5) Minute Air Breaks - total 40 treatments Total # of Treatments: - 40 Antihistamine 30 minutes prior to HBO Treatment, difficulty clearing ears. Finger stick Blood Glucose Pre- and Post- HBOT Treatment. Follow Hyperbaric Oxygen Glycemia Protocol WOUND #1: - Foot Wound Laterality: Plantar Cleanser: Byram Ancillary Kit - 15 Day Supply (Generic) 1 x Per Day/30 Days Discharge Instructions: Use supplies as instructed; Kit contains: (15) Saline Bullets; (15) 3x3 Gauze; 15 pr Gloves Cleanser: Dakin 16 (oz) 0.25 1 x Per Day/30 Days Discharge Instructions: Use as directed. Primary Dressing: Gauze (Generic) 1 x Per Day/30 Days Discharge Instructions: moistened with 1/4 Dakins Solution Primary Dressing: [facility] Oil Emulsion Dressings, 3x8 (in/in) (Generic) 1 x Per Day/30 Days Discharge Instructions: cover exposed bone Secondary Dressing: ABD Pad 5x9 (in/in) (Generic) 1 x Per Day/30 Days Discharge Instructions: Cover with ABD pad Secondary Dressing: Kerlix 4.5 x 4.1 (in/yd) (Generic) 1 x Per Day/30 Days Discharge Instructions: Apply Kerlix 4.5 x 4.1 (in/yd) as instructed Secured With: Medipore Tape - 34M Medipore H Soft Cloth Surgical Tape, 2x2 (in/yd) (Generic) 1 x Per Day/30 Days 1. I would  recommend currently based on what you are saying that we go ahead  and get him started with HBO therapy ASAP. The patient is in agreement with that plan. 2. I am also can recommend that he should be diving at 2.5 ATA with two 5-minute air breaks. 3. I am also going to suggest that we make sure to use the oil emulsion over the metatarsal heads that are exposed followed by use of the Dakin's moistened gauze dressing which I think is doing decently well for him currently. Consider [place] in the future depending on how things progress. Right now he is doing so well I do not want make any major changes here. 4. He is going to continue as well with the knee scooter which I think is absolutely appropriate. We will see patient back for reevaluation in 1 week here in the clinic. If anything worsens or changes patient will contact our office for additional recommendations. Electronic Signature(s) Signed: 06/15/2022 4:35:00 PM By: Worthy Keeler PA-C Entered By: Worthy Keeler on 06/15/2022 16:35:00 [name] ([id number]) -------------------------------------------------------------------------------- ROS/PFSH Details Patient Name: [name] Date of Service: 06/15/2022 8:45 AM Medical Record Number: [id number] Patient Account Number: [id number] Date of Birth/Sex: 04/19/1966 (56 y.o. M) Treating RN: Carlene Coria Primary Care Provider: PATIENT, NO Other Clinician: Referring Provider: Fredirick Maudlin Treating Provider/Extender: Skipper Cliche in Treatment: 0 Information Obtained From Patient Integumentary (Skin) Complaints and Symptoms: Positive for: Wounds Endocrine Medical History: Positive for: Type II Diabetes Treated with: Insulin, Oral agents Blood sugar tested every day: Yes Tested : Blood sugar testing results: Breakfast: 180 Immunizations Pneumococcal Vaccine: Received Pneumococcal Vaccination: No Implantable Devices None Family and Social History Former smoker; Marital  Status - Single; Alcohol Use: Never; Drug Use: No History; Caffeine Use: Rarely Electronic Signature(s) Signed: 06/15/2022 4:35:29 PM By: Worthy Keeler PA-C Signed: 06/16/2022 10:45:13 AM By: Carlene Coria RN Entered By: Carlene Coria on 06/15/2022 09:12:26 BRENDIN, SITU ([id number]) -------------------------------------------------------------------------------- SuperBill Details Patient Name: [name] Date of Service: 06/15/2022 Medical Record Number: [id number] Patient Account Number: [id number] Date of Birth/Sex: [birth year] (56 y.o. M) Treating RN: Carlene Coria Primary Care Provider: PATIENT, NO Other Clinician: Referring Provider: Fredirick Maudlin Treating Provider/Extender: Skipper Cliche in Treatment: 0 Diagnosis Coding ICD-10 Codes Code Description [phone] Other chronic osteomyelitis, left ankle and foot E11.621 Type 2 diabetes mellitus with foot ulcer L97.524 Non-pressure chronic ulcer of other part of left foot with necrosis of bone Facility Procedures CPT4 Code: 88875797 Description: 99214 - WOUND CARE VISIT-LEV 4 EST PT Modifier: Quantity: 1 Physician Procedures CPT4 Code: 2820601 Description: 56153 - WC PHYS LEVEL 4 - EST PT Modifier: Quantity: 1 CPT4 Code: Description: ICD-10 Diagnosis Description M86.672 Other chronic osteomyelitis, left ankle and foot E11.621 Type 2 diabetes mellitus with foot ulcer L97.524 Non-pressure chronic ulcer of other part of left foot with necrosis of Modifier: bone Quantity: Electronic Signature(s) Signed: 06/15/2022 4:35:12 PM By: Worthy Keeler PA-C Previous Signature: 06/15/2022 12:45:29 PM Version By: Carlene Coria RN Entered By: Worthy Keeler on 06/15/2022 16:35:12

## 2022-06-16 NOTE — Progress Notes (Signed)
[name], [name] ([id number]) Visit Report for 06/15/2022 Allergy List Details Patient Name: [name], [name] Date of Service: 06/15/2022 8:45 AM Medical Record Number: [id number] Patient Account Number: [id number] Date of Birth/Sex: 07-Nov-1966 (56 y.o. M) Treating RN: Carlene Coria Primary Care Deondre Marinaro: PATIENT, NO Other Clinician: Referring Kenyata Napier: Fredirick Maudlin Treating Ammi Hutt/Extender: Skipper Cliche in Treatment: 0 Allergies Active Allergies No Known Allergies Allergy Notes Electronic Signature(s) Signed: 06/16/2022 10:45:13 AM By: Carlene Coria RN Entered By: Carlene Coria on 06/15/2022 09:09:06 [name] ([id number]) -------------------------------------------------------------------------------- Arrival Information Details Patient Name: [name] Date of Service: 06/15/2022 8:45 AM Medical Record Number: [id number] Patient Account Number: [id number] Date of Birth/Sex: 06/25/1966 (56 y.o. M) Treating RN: Carlene Coria Primary Care Mazen Marcin: PATIENT, NO Other Clinician: Referring Coley Littles: Fredirick Maudlin Treating Aerith Canal/Extender: Skipper Cliche in Treatment: 0 Visit Information Patient Arrived: Knee Scooter Arrival Time: 09:01 Accompanied By: self Transfer Assistance: None Patient Identification Verified: Yes Secondary Verification Process Completed: Yes Patient Requires Transmission-Based Precautions: No Patient Has Alerts: No Electronic Signature(s) Signed: 06/16/2022 10:45:13 AM By: Carlene Coria RN Entered By: Carlene Coria on 06/15/2022 09:06:26 [name] ([id number]) -------------------------------------------------------------------------------- Clinic Level of Care Assessment Details Patient Name: [name] Date of Service: 06/15/2022 8:45 AM Medical Record Number: [id number] Patient Account Number: [id number] Date of Birth/Sex: 06-18-1966 (56 y.o. M) Treating RN: Carlene Coria Primary Care Clydean Posas: PATIENT, NO Other Clinician: Referring Roza Creamer: Fredirick Maudlin Treating Yidel Teuscher/Extender: Skipper Cliche in Treatment: 0 Clinic Level of Care Assessment Items TOOL 2 Quantity Score X - Use when only an EandM is performed on the INITIAL visit 1 0 ASSESSMENTS - Nursing Assessment / Reassessment X - General Physical Exam (combine w/ comprehensive assessment (listed just below) when performed on new 1 20 pt. evals) X- 1 25 Comprehensive Assessment (HX, ROS, Risk Assessments, Wounds Hx, etc.) ASSESSMENTS - Wound and Skin Assessment / Reassessment X - Simple Wound Assessment / Reassessment - one wound 1 5 []  - 0 Complex Wound Assessment / Reassessment - multiple wounds []  - 0 Dermatologic / Skin Assessment (not related to wound area) ASSESSMENTS - Ostomy and/or Continence Assessment and Care []  - Incontinence Assessment and Management 0 []  - 0 Ostomy Care Assessment and Management (repouching, etc.) PROCESS - Coordination of Care X - Simple Patient / Family Education for ongoing care 1 15 []  - 0 Complex (extensive) Patient / Family Education for ongoing care X- 1 10 Staff obtains Programmer, systems, Records, Test Results / Process Orders []  - 0 Staff telephones HHA, Nursing Homes / Clarify orders / etc []  - 0 Routine Transfer to another Facility (non-emergent condition) []  - 0 Routine Hospital Admission (non-emergent condition) X- 1 15 New Admissions / Biomedical engineer / Ordering NPWT, Apligraf, etc. []  - 0 Emergency Hospital Admission (emergent condition) X- 1 10 Simple Discharge Coordination []  - 0 Complex (extensive) Discharge Coordination PROCESS - Special Needs []  - Pediatric / Minor Patient Management 0 []  - 0 Isolation Patient Management []  - 0 Hearing / Language / Visual special needs []  - 0 Assessment of Community assistance (transportation, D/C planning, etc.) []  - 0 Additional assistance / Altered mentation []  - 0 Support Surface(s) Assessment (bed, cushion, seat, etc.) INTERVENTIONS - Wound Cleansing /  Measurement X - Wound Imaging (photographs - any number of wounds) 1 5 []  - 0 Wound Tracing (instead of photographs) X- 1 5 Simple Wound Measurement - one wound []  - 0 Complex Wound Measurement - multiple wounds [name], [name] ([id number]) X- 1 5 Simple Wound Cleansing - one wound []  -  0 Complex Wound Cleansing - multiple wounds INTERVENTIONS - Wound Dressings X - Small Wound Dressing one or multiple wounds 1 10 []  - 0 Medium Wound Dressing one or multiple wounds []  - 0 Large Wound Dressing one or multiple wounds []  - 0 Application of Medications - injection INTERVENTIONS - Miscellaneous []  - External ear exam 0 []  - 0 Specimen Collection (cultures, biopsies, blood, body fluids, etc.) []  - 0 Specimen(s) / Culture(s) sent or taken to Lab for analysis []  - 0 Patient Transfer (multiple staff / Harrel Lemon Lift / Similar devices) []  - 0 Simple Staple / Suture removal (25 or less) []  - 0 Complex Staple / Suture removal (26 or more) []  - 0 Hypo / Hyperglycemic Management (close monitor of Blood Glucose) X- 1 15 Ankle / Brachial Index (ABI) - do not check if billed separately Has the patient been seen at the hospital within the last three years: Yes Total Score: 140 Level Of Care: New/Established - Level 4 Electronic Signature(s) Signed: 06/16/2022 10:45:13 AM By: Carlene Coria RN Entered By: Carlene Coria on 06/15/2022 12:44:42 [name] ([id number]) -------------------------------------------------------------------------------- Encounter Discharge Information Details Patient Name: [name] Date of Service: 06/15/2022 8:45 AM Medical Record Number: [id number] Patient Account Number: [id number] Date of Birth/Sex: 08/08/1966 (56 y.o. M) Treating RN: Carlene Coria Primary Care Hazaiah Edgecombe: PATIENT, NO Other Clinician: Referring Srijan Givan: Fredirick Maudlin Treating Keyan Folson/Extender: Skipper Cliche in Treatment: 0 Encounter Discharge Information Items Post Procedure Vitals Discharge  Condition: Stable Temperature (F): 98.3 Ambulatory Status: Knee Scooter Pulse (bpm): 89 Discharge Destination: Home Respiratory Rate (breaths/min): 18 Transportation: Private Auto Blood Pressure (mmHg): 153/91 Accompanied By: self Schedule Follow-up Appointment: No Clinical Summary of Care: Electronic Signature(s) Signed: 06/15/2022 12:47:22 PM By: Carlene Coria RN Entered By: Carlene Coria on 06/15/2022 12:47:22 [name] ([id number]) -------------------------------------------------------------------------------- Lower Extremity Assessment Details Patient Name: [name] Date of Service: 06/15/2022 8:45 AM Medical Record Number: [id number] Patient Account Number: [id number] Date of Birth/Sex: 03/24/1966 (56 y.o. M) Treating RN: Carlene Coria Primary Care Kimberlie Csaszar: PATIENT, NO Other Clinician: Referring Reana Chacko: Fredirick Maudlin Treating Emberley Kral/Extender: Skipper Cliche in Treatment: 0 Edema Assessment Assessed: [Left: No] [Right: No] Edema: [Left: Ye] [Right: s] Calf Left: Right: Point of Measurement: 32 cm From Medial Instep 39 cm Ankle Left: Right: Point of Measurement: 11 cm From Medial Instep 24 cm Knee To Floor Left: Right: From Medial Instep 44 cm Vascular Assessment Pulses: Dorsalis Pedis Palpable: [Left:Yes] Blood Pressure: Brachial: [Left:153] Ankle: [Left:Dorsalis Pedis: 170 1.11] Electronic Signature(s) Signed: 06/16/2022 10:45:13 AM By: Carlene Coria RN Entered By: Carlene Coria on 06/15/2022 09:35:30 [name] ([id number]) -------------------------------------------------------------------------------- Multi Wound Chart Details Patient Name: [name] Date of Service: 06/15/2022 8:45 AM Medical Record Number: [id number] Patient Account Number: [id number] Date of Birth/Sex: 17-Nov-1966 (56 y.o. M) Treating RN: Carlene Coria Primary Care Ellison Rieth: PATIENT, NO Other Clinician: Referring Tyron Manetta: Fredirick Maudlin Treating Briyan Kleven/Extender: Skipper Cliche in Treatment: 0 Vital Signs Height(in): 70 Pulse(bpm): 89 Weight(lbs): 230 Blood Pressure(mmHg): 153/91 Body Mass Index(BMI): 33 Temperature(F): 98.3 Respiratory Rate(breaths/min): 18 Photos: [N/A:N/A] Wound Location: Plantar Foot N/A N/A Wounding Event: Gradually Appeared N/A N/A Primary Etiology: Diabetic Wound/Ulcer of the Lower N/A N/A Extremity Comorbid History: Type II Diabetes N/A N/A Date Acquired: 12/04/2021 N/A N/A Weeks of Treatment: 0 N/A N/A Wound Status: Open N/A N/A Wound Recurrence: No N/A N/A Measurements L x W x D (cm) 16x8x0.5 N/A N/A Area (cm) : 100.531 N/A N/A Volume (cm) : 50.265 N/A N/A Classification: Grade 2 N/A N/A Exudate Amount:  Medium N/A N/A Exudate Type: Serosanguineous N/A N/A Exudate Color: red, brown N/A N/A Granulation Amount: Large (67-100%) N/A N/A Granulation Quality: Red N/A N/A Necrotic Amount: Small (1-33%) N/A N/A Exposed Structures: Fat Layer (Subcutaneous Tissue): N/A N/A Yes Fascia: No Tendon: No Muscle: No Joint: No Bone: No Epithelialization: None N/A N/A Treatment Notes Electronic Signature(s) Signed: 06/16/2022 10:45:13 AM By: Carlene Coria RN Entered By: Carlene Coria on 06/15/2022 10:00:33 [name] ([id number]) -------------------------------------------------------------------------------- Multi-Disciplinary Care Plan Details Patient Name: [name] Date of Service: 06/15/2022 8:45 AM Medical Record Number: [id number] Patient Account Number: [id number] Date of Birth/Sex: [birth year] (56 y.o. M) Treating RN: Carlene Coria Primary Care Minoru Chap: PATIENT, NO Other Clinician: Referring Gricel Copen: Fredirick Maudlin Treating Dilara Navarrete/Extender: Skipper Cliche in Treatment: 0 Active Inactive Wound/Skin Impairment Nursing Diagnoses: Knowledge deficit related to ulceration/compromised skin integrity Goals: Patient/caregiver will verbalize understanding of skin care regimen Date Initiated:  06/15/2022 Target Resolution Date: 07/16/2022 Goal Status: Active Ulcer/skin breakdown will have a volume reduction of 30% by week 4 Date Initiated: 06/15/2022 Target Resolution Date: 07/16/2022 Goal Status: Active Ulcer/skin breakdown will have a volume reduction of 50% by week 8 Date Initiated: 06/15/2022 Target Resolution Date: 08/16/2022 Goal Status: Active Ulcer/skin breakdown will have a volume reduction of 80% by week 12 Date Initiated: 06/15/2022 Target Resolution Date: 09/15/2022 Goal Status: Active Ulcer/skin breakdown will heal within 14 weeks Date Initiated: 06/15/2022 Target Resolution Date: 10/16/2022 Goal Status: Active Interventions: Assess patient/caregiver ability to obtain necessary supplies Assess patient/caregiver ability to perform ulcer/skin care regimen upon admission and as needed Assess ulceration(s) every visit Notes: Electronic Signature(s) Signed: 06/16/2022 10:45:13 AM By: Carlene Coria RN Entered By: Carlene Coria on 06/15/2022 10:00:14 [name] ([id number]) -------------------------------------------------------------------------------- Pain Assessment Details Patient Name: [name] Date of Service: 06/15/2022 8:45 AM Medical Record Number: [id number] Patient Account Number: [id number] Date of Birth/Sex: 10-29-1966 (56 y.o. M) Treating RN: Carlene Coria Primary Care Wiktoria Hemrick: PATIENT, NO Other Clinician: Referring Deidrick Rainey: Fredirick Maudlin Treating Dhani Imel/Extender: Skipper Cliche in Treatment: 0 Active Problems Location of Pain Severity and Description of Pain Patient Has Paino Yes Site Locations With Dressing Change: Yes Duration of the Pain. Constant / Intermittento Intermittent Rate the pain. Current Pain Level: 8 Worst Pain Level: 10 Least Pain Level: 0 Tolerable Pain Level: 5 Character of Pain Describe the Pain: Stabbing, Throbbing Pain Management and Medication Current Pain Management: Medication: Yes Cold Application: No Rest:  Yes Massage: No Activity: No T.E.N.S.: No Heat Application: No Leg drop or elevation: No Is the Current Pain Management Adequate: Inadequate How does your wound impact your activities of daily livingo Sleep: Yes Bathing: No Appetite: No Relationship With Others: No Bladder Continence: No Emotions: No Bowel Continence: No Work: No Toileting: No Drive: No Dressing: No Hobbies: No Electronic Signature(s) Signed: 06/16/2022 10:45:13 AM By: Carlene Coria RN Entered By: Carlene Coria on 06/15/2022 [name], [name] ([id number]) -------------------------------------------------------------------------------- Patient/Caregiver Education Details Patient Name: [name] Date of Service: 06/15/2022 8:45 AM Medical Record Number: [id number] Patient Account Number: [id number] Date of Birth/Gender: [birth year] (56 y.o. M) Treating RN: Carlene Coria Primary Care Physician: PATIENT, NO Other Clinician: Referring Physician: Fredirick Maudlin Treating Physician/Extender: Skipper Cliche in Treatment: 0 Education Assessment Education Provided To: Patient Education Topics Provided Wound/Skin Impairment: Methods: Explain/Verbal Responses: State content correctly Electronic Signature(s) Signed: 06/16/2022 10:45:13 AM By: Carlene Coria RN Entered By: Carlene Coria on 06/15/2022 12:46:23 [name] ([id number]) -------------------------------------------------------------------------------- Wound Assessment Details Patient Name: [name] Date of Service: 06/15/2022 8:45 AM Medical Record Number: [id number] Patient  Account Number: [id number] Date of Birth/Sex: [birth year] (56 y.o. M) Treating RN: Carlene Coria Primary Care Amaria Mundorf: PATIENT, NO Other Clinician: Referring Leng Montesdeoca: Fredirick Maudlin Treating Shuntavia Yerby/Extender: Skipper Cliche in Treatment: 0 Wound Status Wound Number: 1 Primary Etiology: Diabetic Wound/Ulcer of the Lower Extremity Wound Location: Plantar Foot Wound  Status: Open Wounding Event: Gradually Appeared Comorbid History: Type II Diabetes Date Acquired: 12/04/2021 Weeks Of Treatment: 0 Clustered Wound: No Pending Amputation On Presentation Photos Wound Measurements Length: (cm) 16 Width: (cm) 8 Depth: (cm) 0.5 Area: (cm) 100.531 Volume: (cm) 50.265 % Reduction in Area: 0% % Reduction in Volume: 0% Epithelialization: None Tunneling: No Undermining: No Wound Description Classification: Grade 4 Exudate Amount: Medium Exudate Type: Serosanguineous Exudate Color: red, brown Foul Odor After Cleansing: No Slough/Fibrino Yes Wound Bed Granulation Amount: Large (67-100%) Exposed Structure Granulation Quality: Red Fascia Exposed: Yes Necrotic Amount: Small (1-33%) Fat Layer (Subcutaneous Tissue) Exposed: Yes Necrotic Quality: Adherent Slough Tendon Exposed: Yes Muscle Exposed: Yes Necrosis of Muscle: Yes Joint Exposed: No Bone Exposed: Yes Treatment Notes Wound #1 (Foot) Wound Laterality: Plantar Cleanser Byram Ancillary Kit - 15 Day Supply Discharge Instruction: Use supplies as instructed; Kit contains: (15) Saline Bullets; (15) 3x3 Gauze; 15 pr Gloves Dakin 16 (oz) 0.25 [name], [name] ([id number]) Discharge Instruction: Use as directed. Peri-Wound Care Topical Primary Dressing Gauze Discharge Instruction: moistened with 1/4 Dakins Solution [name] Oil Emulsion Dressings, 3x8 (in/in) Discharge Instruction: cover exposed bone Secondary Dressing ABD Pad 5x9 (in/in) Discharge Instruction: Cover with ABD pad Kerlix 4.5 x 4.1 (in/yd) Discharge Instruction: Apply Kerlix 4.5 x 4.1 (in/yd) as instructed Secured With [place] H Soft Cloth Surgical Tape, 2x2 (in/yd) Compression Wrap Compression Stockings Add-Ons Electronic Signature(s) Signed: 06/15/2022 11:55:37 AM By: Carlene Coria RN Previous Signature: 06/15/2022 11:54:46 AM Version By: Carlene Coria RN Entered By: Carlene Coria on 06/15/2022  11:55:37 [name] ([id number]) -------------------------------------------------------------------------------- Vitals Details Patient Name: [name] Date of Service: 06/15/2022 8:45 AM Medical Record Number: [id number] Patient Account Number: [id number] Date of Birth/Sex: [birth year] (56 y.o. M) Treating RN: Carlene Coria Primary Care Zowie Lundahl: PATIENT, NO Other Clinician: Referring Samir Ishaq: Fredirick Maudlin Treating Shedrick Sarli/Extender: Skipper Cliche in Treatment: 0 Vital Signs Time Taken: 09:08 Temperature (F): 98.3 Height (in): 70 Pulse (bpm): 89 Source: Stated Respiratory Rate (breaths/min): 18 Weight (lbs): 230 Blood Pressure (mmHg): 153/91 Source: Stated Reference Range: 80 - 120 mg / dl Body Mass Index (BMI): 33 Electronic Signature(s) Signed: 06/16/2022 10:45:13 AM By: Carlene Coria RN Entered By: Carlene Coria on 06/15/2022 09:08:47

## 2022-06-17 DIAGNOSIS — E11621 Type 2 diabetes mellitus with foot ulcer: Secondary | ICD-10-CM | POA: Diagnosis not present

## 2022-06-17 DIAGNOSIS — L97502 Non-pressure chronic ulcer of other part of unspecified foot with fat layer exposed: Secondary | ICD-10-CM | POA: Diagnosis not present

## 2022-06-19 ENCOUNTER — Encounter: Payer: 59 | Admitting: Physician Assistant

## 2022-06-19 DIAGNOSIS — E1169 Type 2 diabetes mellitus with other specified complication: Secondary | ICD-10-CM | POA: Diagnosis not present

## 2022-06-19 DIAGNOSIS — L97524 Non-pressure chronic ulcer of other part of left foot with necrosis of bone: Secondary | ICD-10-CM | POA: Diagnosis not present

## 2022-06-19 DIAGNOSIS — R69 Illness, unspecified: Secondary | ICD-10-CM | POA: Diagnosis not present

## 2022-06-19 DIAGNOSIS — E11621 Type 2 diabetes mellitus with foot ulcer: Secondary | ICD-10-CM | POA: Diagnosis not present

## 2022-06-19 DIAGNOSIS — M86672 Other chronic osteomyelitis, left ankle and foot: Secondary | ICD-10-CM | POA: Diagnosis not present

## 2022-06-19 LAB — GLUCOSE, CAPILLARY
Glucose-Capillary: 234 mg/dL — ABNORMAL HIGH (ref 70–99)
Glucose-Capillary: 180 mg/dL — ABNORMAL HIGH (ref 70–99)

## 2022-06-19 NOTE — Progress Notes (Signed)
THAYER, EMBLETON ([id number]) Visit Report for 06/19/2022 Arrival Information Details Patient Name: [name], [name] Date of Service: 06/19/2022 8:45 AM Medical Record Number: [id number] Patient Account Number: [id number] Date of Birth/Sex: Oct 09, 1966 (56 y.o. M) Treating RN: Yevonne Pax Primary Care Annalia Metzger: PATIENT, NO Other Clinician: Izetta Dakin Referring Virginia Francisco: Duanne Guess Treating Franchot Pollitt/Extender: Rowan Blase in Treatment: 0 Visit Information History Since Last Visit Added or deleted any medications: No Patient Arrived: Knee Scooter Any new allergies or adverse reactions: No Arrival Time: 08:15 Had a fall or experienced change in No Accompanied By: self activities of daily living that may affect Transfer Assistance: None risk of falls: Patient Identification Verified: Yes Signs or symptoms of abuse/neglect since last visito No Secondary Verification Process Completed: Yes Hospitalized since last visit: No Patient Requires Transmission-Based Precautions: No Implantable device outside of the clinic excluding No Patient Has Alerts: No cellular tissue based products placed in the center since last visit: Has Dressing in Place as Prescribed: Yes Pain Present Now: No Electronic Signature(s) Signed: 06/19/2022 2:54:29 PM By: Aleda Grana RCP, RRT, CHT Entered By: Aleda Grana on 06/19/2022 09:07:04 [name] ([id number]) -------------------------------------------------------------------------------- Encounter Discharge Information Details Patient Name: [name] Date of Service: 06/19/2022 8:45 AM Medical Record Number: [id number] Patient Account Number: [id number] Date of Birth/Sex: 03-31-1966 (56 y.o. M) Treating RN: Yevonne Pax Primary Care Divinity Kyler: PATIENT, NO Other Clinician: Izetta Dakin Referring Geovani Tootle: Duanne Guess Treating Nija Koopman/Extender: Rowan Blase in Treatment: 0 Encounter Discharge Information  Items Discharge Condition: Stable Ambulatory Status: Knee Scooter Discharge Destination: Home Transportation: Private Auto Accompanied By: brother Schedule Follow-up Appointment: Yes Clinical Summary of Care: Notes Patient has an HBO treatment scheduled on 06/20/22 at 8:45 am. Electronic Signature(s) Signed: 06/19/2022 2:54:29 PM By: Aleda Grana RCP, RRT, CHT Entered By: Aleda Grana on 06/19/2022 14:01:16 [name] ([id number]) -------------------------------------------------------------------------------- Vitals Details Patient Name: [name] Date of Service: 06/19/2022 8:45 AM Medical Record Number: [id number] Patient Account Number: [id number] Date of Birth/Sex: 09/20/1966 (56 y.o. M) Treating RN: Yevonne Pax Primary Care Jabbar Palmero: PATIENT, NO Other Clinician: Izetta Dakin Referring Hema Lanza: Duanne Guess Treating Kristol Almanzar/Extender: Rowan Blase in Treatment: 0 Vital Signs Time Taken: 08:10 Temperature (F): 98.0 Height (in): 70 Pulse (bpm): 84 Weight (lbs): 230 Respiratory Rate (breaths/min): 18 Body Mass Index (BMI): 33 Blood Pressure (mmHg): 110/72 Capillary Blood Glucose (mg/dl): 474 Reference Range: 80 - 120 mg / dl Electronic Signature(s) Signed: 06/19/2022 2:54:29 PM By: Aleda Grana RCP, RRT, CHT Entered By: Drake Leach, Lucio Edward on 06/19/2022 09:08:44

## 2022-06-19 NOTE — Telephone Encounter (Signed)
Per Jeri Modena - care was transferred to [facility]. Tressa Busman, CMA confirmed with [facility] that patient has been receiving his medication.    Little Bashore Lesli Albee, CMA

## 2022-06-20 ENCOUNTER — Encounter: Payer: 59 | Admitting: Physician Assistant

## 2022-06-20 ENCOUNTER — Ambulatory Visit (INDEPENDENT_AMBULATORY_CARE_PROVIDER_SITE_OTHER): Payer: 59 | Admitting: Internal Medicine

## 2022-06-20 ENCOUNTER — Encounter: Payer: Self-pay | Admitting: Internal Medicine

## 2022-06-20 ENCOUNTER — Other Ambulatory Visit: Payer: Self-pay

## 2022-06-20 VITALS — BP 131/80 | HR 87 | Temp 98.1°F | Wt 232.0 lb

## 2022-06-20 DIAGNOSIS — L97524 Non-pressure chronic ulcer of other part of left foot with necrosis of bone: Secondary | ICD-10-CM | POA: Diagnosis not present

## 2022-06-20 DIAGNOSIS — E1169 Type 2 diabetes mellitus with other specified complication: Secondary | ICD-10-CM | POA: Diagnosis not present

## 2022-06-20 DIAGNOSIS — M868X7 Other osteomyelitis, ankle and foot: Secondary | ICD-10-CM | POA: Diagnosis not present

## 2022-06-20 DIAGNOSIS — E11621 Type 2 diabetes mellitus with foot ulcer: Secondary | ICD-10-CM | POA: Diagnosis not present

## 2022-06-20 DIAGNOSIS — M86672 Other chronic osteomyelitis, left ankle and foot: Secondary | ICD-10-CM | POA: Diagnosis not present

## 2022-06-20 DIAGNOSIS — R69 Illness, unspecified: Secondary | ICD-10-CM | POA: Diagnosis not present

## 2022-06-20 LAB — GLUCOSE, CAPILLARY: Glucose-Capillary: 245 mg/dL — ABNORMAL HIGH (ref 70–99)

## 2022-06-20 NOTE — Progress Notes (Signed)
KOAH, CHISENHALL ([id number]) Visit Report for 06/20/2022 Problem List Details Patient Name: [name], [name] Date of Service: 06/20/2022 8:00 AM Medical Record Number: [id number] Patient Account Number: [id number] Date of Birth/Sex: [birth year] (56 y.o. 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 M) Treating RN: Yevonne Pax Primary Care Provider: PATIENT, NO Other Clinician: Izetta Dakin Referring Provider: Duanne Guess Treating Provider/Extender: Rowan Blase in Treatment: 0 Diagnosis Coding ICD-10 Codes Code Description [phone] Other chronic osteomyelitis, left ankle and foot E11.621 Type 2 diabetes mellitus with foot ulcer L97.524 Non-pressure chronic ulcer of other part of left foot with necrosis of bone Physician Procedures CPT4 Code: 0539767 Description: 99213 - WC PHYS LEVEL 3 - EST PT Modifier: Quantity: 1 CPT4 Code: Description: ICD-10 Diagnosis Description M86.672 Other chronic osteomyelitis, left ankle and foot E11.621 Type 2 diabetes mellitus with foot  ulcer L97.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

## 2022-06-20 NOTE — Progress Notes (Addendum)
Patient Active Problem List   Diagnosis Date Noted   Pyogenic inflammation of bone (HCC) 06/02/2022   PICC (peripherally inserted central catheter) in place 06/02/2022   Medication management 06/02/2022   Diabetic foot infection (HCC) 06/02/2022   Smoking 06/02/2022   Tobacco abuse 05/06/2022   Hyperkalemia 05/06/2022   Type 2 diabetes mellitus (HCC) 05/05/2022   Obesity (BMI 30-39.9) 05/05/2022   Cellulitis and abscess of foot    AKI (acute kidney injury) (HCC) 05/04/2022   Injury of left foot    Diabetic foot ulcer (HCC) 12/14/2021    Patient's Medications  New Prescriptions   No medications on file  Previous Medications   ACETAMINOPHEN (TYLENOL) 500 MG TABLET    Take 2,000 mg by mouth every 6 (six) hours as needed for mild pain or moderate pain.   ATORVASTATIN (LIPITOR) 20 MG TABLET    Take 1 tablet (20 mg total) by mouth daily.   CONTINUOUS BLOOD GLUC RECEIVER (DEXCOM G6 RECEIVER) DEVI    Use to check blood sugar three times daily. E11.69   CONTINUOUS BLOOD GLUC SENSOR (DEXCOM G6 SENSOR) MISC    Use to check blood sugar three times daily. Change sensor once every 10 days. E11.69   CONTINUOUS BLOOD GLUC TRANSMIT (DEXCOM G6 TRANSMITTER) MISC    Use to check blood sugar three times daily. Change transmitter once every 909 days. E11.69   INSULIN NPH, HUMAN,, ISOPHANE, (HUMULIN N KWIKPEN) 100 UNIT/ML KIWKPEN    Inject 20 Units into the skin 2 (two) times daily.   INSULIN PEN NEEDLE 32G X 4 MM MISC    Use as directed to inject insulin up to 4 times daily.   INSULIN PEN NEEDLE 32G X 4 MM MISC    Use to inject insulin up to 4 times daily as directed.   LEVOFLOXACIN (LEVAQUIN) 750 MG TABLET    Take 1 tablet (750 mg total) by mouth daily.   METFORMIN (GLUCOPHAGE) 500 MG TABLET    Take 1 tablet (500 mg total) by mouth 2 (two) times daily.   OXYCODONE (OXY IR/ROXICODONE) 5 MG IMMEDIATE RELEASE TABLET    Take 1 tablet (5 mg total) by mouth every 4 (four) hours as needed for  moderate pain (pain score 4-6).   SEMAGLUTIDE,0.25 OR 0.5MG /DOS, (OZEMPIC, 0.25 OR 0.5 MG/DOSE,) 2 MG/1.5ML SOPN    Inject 0.25 mg into the skin once a week.   SODIUM CHLORIDE FLUSH (SALINE FLUSH) 0.9 % SOLN    Inject 10 mLs into the vein in the morning, at noon, and at bedtime.   TEMAZEPAM (RESTORIL) 7.5 MG CAPSULE    Take 1 capsule (7.5 mg total) by mouth at bedtime as needed for sleep.  Modified Medications   No medications on file  Discontinued Medications   No medications on file    Subjective: 56 year old male with past medical history as below presents for hospital follow-up.  He was admitted to [facility] 6/1 - 6/8 for fifth metatarsal osteomyelitis.  He was admitted with WBC 19 K, temp of 101.  MRI showed left foot fifth metatarsal osteomyelitis and extensive soft tissue gas/myositis in the plantar region.  Taken to the OR with Dr. Lajoyce Corners for debridement.  Noted to have extensive necrosis of tissue. OR Cx +Klebsiella oxytoca, MSSA, strep gordonii, strep cellulitis, Prevotella species beta-lactamase positive.  Ortho recommended transtibial amputation during hospitalization and a follow-up appointment on 6/15.  In agreement with need for amputation, although patient wanted to  do a 6-week course of IV antibiotics for, " try anything he can". 05/23/2022.  Patient is on cefazolin, metronidazole, Levaquin with EOT 06/15/2022.  Brother is present at visit.  Patient notes he has purulent drainage from his foot.  He denies fevers and chills.  6/30: Seen by ID Dr. Elinor Parkinson, antibiotics extended till 7/18. Wound noted to be smaller per wound care on 6/23. 7/18: PICC line was out on 7/14. He started hyperbaric therapy yesterday with wound care.  Review of Systems: Review of Systems  All other systems reviewed and are negative.   Past Medical History:  Diagnosis Date   Diabetes mellitus without complication (HCC)     Social History   Tobacco Use   Smoking status: Every Day    Packs/day: 0.25     Types: Cigarettes   Smokeless tobacco: Never   Tobacco comments:    Cutting back - goes through a pack in 4-6 days  Vaping Use   Vaping Use: Never used  Substance Use Topics   Alcohol use: No   Drug use: No    No family history on file.  No Known Allergies  Health Maintenance  Topic Date Due   COVID-19 Vaccine (1) Never done   FOOT EXAM  Never done   OPHTHALMOLOGY EXAM  Never done   COLONOSCOPY (Pts 45-94yrs Insurance coverage will need to be confirmed)  Never done   Zoster Vaccines- Shingrix (1 of 2) 09/05/2022 (Originally 12/16/1985)   INFLUENZA VACCINE  07/04/2022   HEMOGLOBIN A1C  11/06/2022   URINE MICROALBUMIN  06/06/2023   TETANUS/TDAP  06/01/2024   Hepatitis C Screening  Completed   HIV Screening  Completed   HPV VACCINES  Aged Out    Objective:  Vitals:   06/20/22 0951  Weight: 232 lb (105.2 kg)   Body mass index is 33.29 kg/m.  Physical Exam Constitutional:      General: He is not in acute distress.    Appearance: He is normal weight. He is not toxic-appearing.  HENT:     Head: Normocephalic and atraumatic.     Right Ear: External ear normal.     Left Ear: External ear normal.     Nose: No congestion or rhinorrhea.     Mouth/Throat:     Mouth: Mucous membranes are moist.     Pharynx: Oropharynx is clear.  Eyes:     Extraocular Movements: Extraocular movements intact.     Conjunctiva/sclera: Conjunctivae normal.     Pupils: Pupils are equal, round, and reactive to light.  Cardiovascular:     Rate and Rhythm: Normal rate and regular rhythm.     Heart sounds: No murmur heard.    No friction rub. No gallop.  Pulmonary:     Effort: Pulmonary effort is normal.     Breath sounds: Normal breath sounds.  Abdominal:     General: Abdomen is flat. Bowel sounds are normal.     Palpations: Abdomen is soft.  Musculoskeletal:        General: No swelling. Normal range of motion.     Cervical back: Normal range of motion and neck supple.  Skin:     General: Skin is warm and dry.  Neurological:     General: No focal deficit present.     Mental Status: He is oriented to person, place, and time.  Psychiatric:        Mood and Affect: Mood normal.     Lab Results Lab Results  Component Value Date  WBC 6.8 05/28/2022   HGB 10.8 (L) 05/28/2022   HCT 34.7 (L) 05/28/2022   MCV 84.8 05/28/2022   PLT 306 05/28/2022    Lab Results  Component Value Date   CREATININE 1.17 05/28/2022   BUN 18 05/28/2022   NA 138 05/28/2022   K 4.3 05/28/2022   CL 105 05/28/2022   CO2 23 05/28/2022    Lab Results  Component Value Date   ALT 13 05/09/2022   AST 17 05/09/2022   ALKPHOS 59 05/09/2022   BILITOT 0.5 05/09/2022    Lab Results  Component Value Date   CHOL 101 12/14/2021   HDL 29 (L) 12/14/2021   LDLCALC 45 12/14/2021   TRIG 134 12/14/2021   CHOLHDL 3.5 12/14/2021   No results found for: "LABRPR", "RPRTITER" No results found for: "HIV1RNAQUANT", "HIV1RNAVL", "CD4TABS"   Problem List Items Addressed This Visit   None  #Left 5th metatarsal osteomyelitis #Plantar tissue necrosis SP debridement with OR tissue cultures growing Klebsiella oxytoca, MSSA, strep gordonii, strep gallolyticus, Prevotella species beta-lactamase positive -Pt was discharged on cefazolin+ levaquin(1/2 species of strep gallolyticus CTX resistant and PEN intermediate)+ metro  x 6 weeks EOT 7/11. Woun Cx + MRSA, but did not grow form OR Cx as such MSSA coverage deemed appropriate.  -Ordered MRI and follow-up with Dr. Elinor Parkinson. - No MRI done by follwow-up. It was canceled due to insurance issues, re-scheduled for 7/23.  -Antibiotics continued for another week tille 7/18. PICC lin out on 7/14, pt ran out of PO levaquin + metro on 7/12. Wound care at that time noted wound getting smaller. -Labs 7/10 wbc 7.8k, Scr 1.15, crp3, -Pt reports wound is closing, he does have throbbing pain without drainage of left foot(feels like wound is closing).  -Wound care note 7/13  noted to get started on HBO ASAP. Noted ot have necrotsis to the bone and pt underwent debridement. Re-evaluation in 1 week Plan: Labs today off of antibiotics(last dose of cefazolin 7/13) -Follow up MRI of foot (7/23). Suspect there will be osteo on imaging as wound care noted necrosis to the wound. There was no drainage today from wound, looks better. Will hold off antibiotics as  wound appears clean. Of note pt had declined BKA during hospitalization and antibiotics x 6 weeks were administered priot to considering further surgical/amputation options. -Follow-up with ID in one month -Follow-up with wound care and Ortho  I spent more than 45 minutes for this patient encounter including reviewing data/chart, and coordinating care and >50% direct face to face time providing counseling/discussing diagnostics/treatment plan with patient   Danelle Earthly, MD [facility] 06/20/2022, 9:54 AM

## 2022-06-20 NOTE — Progress Notes (Signed)
TRAYDEN, BRANDY ([id number]) Visit Report for 06/20/2022 Arrival Information Details Patient Name: [name], [name] Date of Service: 06/20/2022 8:00 AM Medical Record Number: [id number] Patient Account Number: [id number] Date of Birth/Sex: Sep 05, 1966 (56 y.o. M) Treating RN: Yevonne Pax Primary Care Ramey Ketcherside: PATIENT, NO Other Clinician: Izetta Dakin Referring Sabien Umland: Duanne Guess Treating Shivan Hodes/Extender: Rowan Blase in Treatment: 0 Visit Information History Since Last Visit Added or deleted any medications: No Patient Arrived: Ambulatory Any new allergies or adverse reactions: No Arrival Time: 08:05 Had a fall or experienced change in No Accompanied By: self activities of daily living that may affect Transfer Assistance: None risk of falls: Patient Identification Verified: Yes Signs or symptoms of abuse/neglect since last visito No Secondary Verification Process Completed: Yes Hospitalized since last visit: No Patient Requires Transmission-Based Precautions: No Implantable device outside of the clinic excluding No Patient Has Alerts: No cellular tissue based products placed in the center since last visit: Has Dressing in Place as Prescribed: Yes Pain Present Now: No Electronic Signature(s) Signed: 06/20/2022 10:34:13 AM By: Aleda Grana RCP, RRT, CHT Entered By: Aleda Grana on 06/20/2022 10:13:57 [name] ([id number]) -------------------------------------------------------------------------------- Encounter Discharge Information Details Patient Name: [name] Date of Service: 06/20/2022 8:00 AM Medical Record Number: [id number] Patient Account Number: [id number] Date of Birth/Sex: 27-Aug-1966 (56 y.o. M) Treating RN: Yevonne Pax Primary Care Rutherford Alarie: PATIENT, NO Other Clinician: Izetta Dakin Referring Alize Acy: Duanne Guess Treating Harald Quevedo/Extender: Rowan Blase in Treatment: 0 Encounter Discharge Information  Items Discharge Condition: Stable Ambulatory Status: Knee Scooter Discharge Destination: Home Transportation: Private Auto Accompanied By: brother Schedule Follow-up Appointment: No Clinical Summary of Care: Notes Patient is scheduled for HBO on 06/21/22 at 8:45 am. Patient was prescribed over the counter medications by Allen Derry, PA-C. Electronic Signature(s) Signed: 06/20/2022 10:34:13 AM By: Aleda Grana RCP, RRT, CHT Entered By: Aleda Grana on 06/20/2022 10:23:33 TINA, TEMME ([id number]) -------------------------------------------------------------------------------- Vitals Details Patient Name: [name] Date of Service: 06/20/2022 8:00 AM Medical Record Number: [id number] Patient Account Number: [id number] Date of Birth/Sex: 04-05-1966 (56 y.o. M) Treating RN: Yevonne Pax Primary Care Brystal Kildow: PATIENT, NO Other Clinician: Izetta Dakin Referring Maxten Shuler: Duanne Guess Treating Fenix Ruppe/Extender: Rowan Blase in Treatment: 0 Vital Signs Time Taken: 08:07 Temperature (F): 98.5 Height (in): 70 Pulse (bpm): 90 Weight (lbs): 230 Respiratory Rate (breaths/min): 18 Body Mass Index (BMI): 33 Blood Pressure (mmHg): 122/62 Capillary Blood Glucose (mg/dl): 729 Reference Range: 80 - 120 mg / dl Electronic Signature(s) Signed: 06/20/2022 10:34:13 AM By: Aleda Grana RCP, RRT, CHT Entered By: Aleda Grana on 06/20/2022 10:14:31

## 2022-06-20 NOTE — Progress Notes (Signed)
Matthew, [name] ([id number]) Visit Report for 06/19/2022 Problem List Details Patient Name: [name], [name] Date of Service: 06/19/2022 8:45 AM Medical Record Number: [id number] Patient Account Number: [id number] Date of Birth/Sex: 12-29-1965 (56 y.o. 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 M) Treating RN: Yevonne Pax Primary Care Provider: PATIENT, NO Other Clinician: Izetta Dakin Referring Provider: Duanne Guess Treating Provider/Extender: Rowan Blase in Treatment: 0 Diagnosis Coding ICD-10 Codes Code Description [phone] Other chronic osteomyelitis, left ankle and foot E11.621 Type 2 diabetes mellitus with foot ulcer L97.524 Non-pressure chronic ulcer of other part of left foot with necrosis of bone Facility Procedures CPT4 Code: 25852778 Description: (Facility Use Only) HBOT, full body chamber, Modifier: Quantity: 5 Physician Procedures CPT4 Code: 2423536 Description: 14431 - WC PHYS HYPERBARIC OXYGEN THERAPY Modifier: Quantity: 1 CPT4  Code: Description: ICD-10 Diagnosis Description M86.672 Other chronic osteomyelitis, left ankle and foot L97.524 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

## 2022-06-20 NOTE — Progress Notes (Signed)
NIC, LAMPE ([id number]) Visit Report for 06/19/2022 HBO Details Patient Name: [name], [name] Date of Service: 06/19/2022 8:45 AM Medical Record Number: [id number] Patient Account Number: [id number] Date of Birth/Sex: 1965-12-08 (56 y.o. M) Treating RN: Yevonne Pax Primary Care Kieran Arreguin: PATIENT, NO Other Clinician: Izetta Dakin Referring Siena Poehler: Duanne Guess Treating Ariatna Jester/Extender: Rowan Blase in Treatment: 0 HBO Treatment Course Details Treatment Course Number: 1 Ordering Tamarion Haymond: Allen Derry Total Treatments Ordered: 40 HBO Treatment Start Date: 06/19/2022 HBO Indication: Diabetic Ulcer(s) of the Lower Extremity Standard/Conservative Wound Care tried and failed greater than or equal to 30 days Wound #1 Plantar Foot HBO Treatment Details Treatment Number: 1 Patient Type: Outpatient Chamber Type: Monoplace Chamber Serial #: [id number] Treatment Protocol: 2.5 ATA with 90 minutes oxygen, with two 5 minute air breaks Treatment Details Compression Rate Down: 1.0 psi / minute De-Compression Rate Up: 1.5 psi / minute Compress Tx Pressure Air breaks and breathing periods Decompress Decompress Begins Reached (leave unused spaces blank) Begins Ends Chamber Pressure (ATA) 1 2.5 2.5 2.5 2.5 2.5 - - 2.5 1 Clock Time (24 hr) 08:48 09:09 09:40 09:45 10:15 10:20 - - 10:50 11:05 Treatment Length: 137 (minutes) Treatment Segments: 5 Vital Signs Capillary Blood Glucose Reference Range: 80 - 120 mg / dl HBO Diabetic Blood Glucose Intervention Range: <131 mg/dl or >500 mg/dl Time Vitals Blood Respiratory Capillary Blood Glucose Pulse Action Type: Pulse: Temperature: Taken: Pressure: Rate: Glucose (mg/dl): Meter #: Oximetry (%) Taken: Pre 08:10 110/72 84 18 98 234 1 none per protocol Post 11:15 128/84 84 18 97.4 180 1 none per protocol Pre-Treatment Ear Evaluation Left Right Clear: Yes Clear: No Intact: Yes Intact: No Color: grey Color: Grey PE Tubes inserted: No PE  Tubes inserted: No Irrigated: No Irrigated: No Myringotomy performed: No Myringotomy performed: No Left Teed Scale: Grade 0 Right Teed Scale: Grade 0 Treatment Response Treatment Completion Status: Treatment Completed without Adverse Event Additional Procedure Documentation Tissue Sevierity: Necrosis of bone Post Treatment Teed Score Post Treatment Teed Score: Left Ear Grade 0 Post Treatment Teed Score: Right Ear Grade 0 Minner, Brewster ([id number]) HBO Attestation I certify that I supervised this HBO treatment in accordance with Medicare guidelines. A trained emergency response team is readily Yes available per hospital policies and procedures. Continue HBOT as ordered. Yes Electronic Signature(s) Signed: 06/20/2022 5:50:00 PM By: Lenda Kelp PA-C Previous Signature: 06/19/2022 2:54:29 PM Version By: Aleda Grana RCP, RRT, CHT Entered By: Lenda Kelp on 06/20/2022 17:50:00 [name] ([id number]) -------------------------------------------------------------------------------- HBO Safety Checklist Details Patient Name: [name] Date of Service: 06/19/2022 8:45 AM Medical Record Number: [id number] Patient Account Number: [id number] Date of Birth/Sex: Aug 31, 1966 (56 y.o. M) Treating RN: Yevonne Pax Primary Care Alison Kubicki: PATIENT, NO Other Clinician: Izetta Dakin Referring Blasa Raisch: Duanne Guess Treating Jourdan Durbin/Extender: Rowan Blase in Treatment: 0 HBO Safety Checklist Items Safety Checklist Consent Form Signed Patient voided / foley secured and emptied When did you last eato am Last dose of injectable or oral agent 06/18/22 pm Ostomy pouch emptied and vented if applicable NA All implantable devices assessed, documented and approved NA Intravenous access site secured and place NA Valuables secured Linens and cotton and cotton/polyester blend (less than 51% polyester) Personal oil-based products / skin lotions / body lotions removed Wigs or  hairpieces removed NA Smoking or tobacco materials removed NA Books / newspapers / magazines / loose paper removed NA Cologne, aftershave, perfume and deodorant removed Jewelry removed (may wrap wedding band) Make-up removed NA Hair care products removed Battery  operated devices (external) removed NA Heating patches and chemical warmers removed NA Titanium eyewear removed NA Nail polish cured greater than 10 hours NA Casting material cured greater than 10 hours NA Hearing aids removed NA Loose dentures or partials removed NA Prosthetics have been removed NA Patient demonstrates correct use of air break device (if applicable) Patient concerns have been addressed Patient grounding bracelet on and cord attached to chamber Specifics for Inpatients (complete in addition to above) Medication sheet sent with patient Intravenous medications needed or due during therapy sent with patient Drainage tubes (e.g. nasogastric tube or chest tube secured and vented) Endotracheal or Tracheotomy tube secured Cuff deflated of air and inflated with saline Airway suctioned Electronic Signature(s) Signed: 06/19/2022 2:54:29 PM By: Aleda Grana RCP, RRT, CHT Entered By: Aleda Grana on 06/19/2022 09:12:33

## 2022-06-20 NOTE — Progress Notes (Signed)
[name], [name] ([id number]) Visit Report for 06/20/2022 HBO Details Patient Name: [name], [name] Date of Service: 06/20/2022 8:00 AM Medical Record Number: [id number] Patient Account Number: [id number] Date of Birth/Sex: [birth year] (56 y.o. M) Treating RN: Yevonne Pax Primary Care Laurynn Mccorvey: PATIENT, NO Other Clinician: Izetta Dakin Referring Mylissa Lambe: Duanne Guess Treating Cherylyn Sundby/Extender: Rowan Blase in Treatment: 0 HBO Treatment Course Details Treatment Course Number: 1 Ordering Persephonie Hegwood: Allen Derry Total Treatments Ordered: 40 HBO Treatment Start Date: 06/19/2022 HBO Indication: Diabetic Ulcer(s) of the Lower Extremity Standard/Conservative Wound Care tried and failed greater than or equal to 30 days Wound #1 Plantar Foot HBO Treatment Details Treatment Number: Treatment Aborted/Not Restarted: Adverse Event Patient Type: Outpatient Chamber Type: Monoplace Chamber Serial #: [id number] Treatment Protocol: 2.5 ATA with 90 minutes oxygen, with two 5 minute air breaks Treatment Details Compression Rate Down: 1.0 psi / minute De-Compression Rate Up: 2.0 psi / minute Compress Tx Pressure Air breaks and breathing periods Decompress Decompress Begins Reached (leave unused spaces blank) Begins Ends Chamber Pressure (ATA) 1 2.5 2.5 2.5 2.5 2.5 - - 2.5 1 Clock Time (24 hr) 08:22 - - - - - - - 08:25 08:27 Treatment Length: 5 (minutes) Treatment Segments: 0 Vital Signs Capillary Blood Glucose Reference Range: 80 - 120 mg / dl HBO Diabetic Blood Glucose Intervention Range: <131 mg/dl or >371 mg/dl Time Vitals Blood Respiratory Capillary Blood Glucose Pulse Action Type: Pulse: Temperature: Taken: Pressure: Rate: Glucose (mg/dl): Meter #: Oximetry (%) Taken: Pre 08:07 122/62 90 18 98.5 245 1 none per protocol Treatment Response Adverse Events: 1:Barotrauma - Sinus Treatment Completion Status: Treatment Aborted/Not Restarted Reason: Adverse Event Treatment Notes At 1.20 ATA  patient complained of intense pain in the left side of his head which radiated to his left lower jaw. Allen Derry, III, PA-C was called to the room and arrived as the patient was being rolled out of the chamber. PA Stone examined the patient and also called Baltazar Najjar, MD to consult with him about the matter. Additional Procedure Documentation Tissue Sevierity: Necrosis of bone Aristidis Talerico Notes Unfortunately the patient had a significant issue today during HBO therapy which involved him experiencing significant pain in the frontal sinus location on the left and ear pain on the right. His teed score was still 0 in both ears. With that being said he did have pain with palpation over the frontal sinus location when I was tapping he also had increased discomfort in the left frontal sinus when he leaned forward. I feel like this is probably more of a sinus pressure issue to be honest which will be quite extreme when things get too bad. I did actually contact my supervising physician Dr. Leanord Hawking to see his thoughts on the situation as well he was in agreement this seems to be more sinus related as opposed to anything more sinister. With that being said this suggestion we came up with for the time being is Lequita Halt initiate decongestant therapy I recommended OJAS, COONE ([id number]) Mucinex D with the guaifenesin as well as Sudafed and he needs to take a full glass of water with this each time he takes it every 12 hours. Also suggested Afrin for just 3 to 4 days maximum to see if that would help clear things up. If he does not see significant improvement then my recommendation going forward is probably to be that we get him to see ENT for them to possibly do some sinus films and see if there is anything else going on that  they feel like needs to be addressed for him to complete his HBO therapy which is of utmost importance with the severity of his wound. Post Treatment Teed Score Post Treatment Teed Score:  Left Ear Grade 0 Post Treatment Teed Score: Right Ear Grade 0 HBO Attestation I certify that I supervised this HBO treatment in accordance with Medicare guidelines. A trained emergency response team is readily Yes available per hospital policies and procedures. Continue HBOT as ordered. Yes Electronic Signature(s) Signed: 06/20/2022 5:52:39 PM By: Lenda Kelp PA-C Previous Signature: 06/20/2022 10:34:13 AM Version By: Aleda Grana RCP, RRT, CHT Entered By: Lenda Kelp on 06/20/2022 17:52:39 [name] ([id number]) -------------------------------------------------------------------------------- HBO Safety Checklist Details Patient Name: [name] Date of Service: 06/20/2022 8:00 AM Medical Record Number: [id number] Patient Account Number: [id number] Date of Birth/Sex: [birth year] (56 y.o. M) Treating RN: Yevonne Pax Primary Care Jahniah Pallas: PATIENT, NO Other Clinician: Izetta Dakin Referring Dvante Hands: Duanne Guess Treating Camesha Farooq/Extender: Rowan Blase in Treatment: 0 HBO Safety Checklist Items Safety Checklist Consent Form Signed Patient voided / foley secured and emptied When did you last eato am Last dose of injectable or oral agent pm 06/19/22 Ostomy pouch emptied and vented if applicable NA All implantable devices assessed, documented and approved NA Intravenous access site secured and place NA Valuables secured Linens and cotton and cotton/polyester blend (less than 51% polyester) Personal oil-based products / skin lotions / body lotions removed Wigs or hairpieces removed NA Smoking or tobacco materials removed NA Books / newspapers / magazines / loose paper removed NA Cologne, aftershave, perfume and deodorant removed Jewelry removed (may wrap wedding band) Make-up removed NA Hair care products removed Battery operated devices (external) removed NA Heating patches and chemical warmers removed NA Titanium eyewear removed NA Nail  polish cured greater than 10 hours NA Casting material cured greater than 10 hours NA Hearing aids removed NA Loose dentures or partials removed NA Prosthetics have been removed NA Patient demonstrates correct use of air break device (if applicable) Patient concerns have been addressed Patient grounding bracelet on and cord attached to chamber Specifics for Inpatients (complete in addition to above) Medication sheet sent with patient Intravenous medications needed or due during therapy sent with patient Drainage tubes (e.g. nasogastric tube or chest tube secured and vented) Endotracheal or Tracheotomy tube secured Cuff deflated of air and inflated with saline Airway suctioned Electronic Signature(s) Signed: 06/20/2022 10:34:13 AM By: Aleda Grana RCP, RRT, CHT Entered By: Aleda Grana on 06/20/2022 10:15:51

## 2022-06-21 ENCOUNTER — Encounter: Payer: 59 | Admitting: Internal Medicine

## 2022-06-21 DIAGNOSIS — L97524 Non-pressure chronic ulcer of other part of left foot with necrosis of bone: Secondary | ICD-10-CM | POA: Diagnosis not present

## 2022-06-21 DIAGNOSIS — E11621 Type 2 diabetes mellitus with foot ulcer: Secondary | ICD-10-CM | POA: Diagnosis not present

## 2022-06-21 DIAGNOSIS — M86672 Other chronic osteomyelitis, left ankle and foot: Secondary | ICD-10-CM | POA: Diagnosis not present

## 2022-06-21 DIAGNOSIS — R69 Illness, unspecified: Secondary | ICD-10-CM | POA: Diagnosis not present

## 2022-06-21 DIAGNOSIS — E1169 Type 2 diabetes mellitus with other specified complication: Secondary | ICD-10-CM | POA: Diagnosis not present

## 2022-06-21 LAB — CBC WITH DIFFERENTIAL/PLATELET
Absolute Monocytes: 672 cells/uL (ref 200–950)
Basophils Absolute: 32 cells/uL (ref 0–200)
Basophils Relative: 0.4 %
Eosinophils Absolute: 344 cells/uL (ref 15–500)
Eosinophils Relative: 4.3 %
HCT: 35.4 % — ABNORMAL LOW (ref 38.5–50.0)
Hemoglobin: 11.2 g/dL — ABNORMAL LOW (ref 13.2–17.1)
Lymphs Abs: 2248 cells/uL (ref 850–3900)
MCH: 26.4 pg — ABNORMAL LOW (ref 27.0–33.0)
MCHC: 31.6 g/dL — ABNORMAL LOW (ref 32.0–36.0)
MCV: 83.3 fL (ref 80.0–100.0)
MPV: 9.6 fL (ref 7.5–12.5)
Monocytes Relative: 8.4 %
Neutro Abs: 4704 cells/uL (ref 1500–7800)
Neutrophils Relative %: 58.8 %
Platelets: 298 10*3/uL (ref 140–400)
RBC: 4.25 10*6/uL (ref 4.20–5.80)
RDW: 13.2 % (ref 11.0–15.0)
Total Lymphocyte: 28.1 %
WBC: 8 10*3/uL (ref 3.8–10.8)

## 2022-06-21 LAB — COMPLETE METABOLIC PANEL WITH GFR
AG Ratio: 1.4 (calc) (ref 1.0–2.5)
ALT: 9 U/L (ref 9–46)
AST: 13 U/L (ref 10–35)
Albumin: 3.8 g/dL (ref 3.6–5.1)
Alkaline phosphatase (APISO): 53 U/L (ref 35–144)
BUN/Creatinine Ratio: 17 (calc) (ref 6–22)
BUN: 23 mg/dL (ref 7–25)
CO2: 31 mmol/L (ref 20–32)
Calcium: 8.8 mg/dL (ref 8.6–10.3)
Chloride: 106 mmol/L (ref 98–110)
Creat: 1.37 mg/dL — ABNORMAL HIGH (ref 0.70–1.30)
Globulin: 2.8 g/dL (calc) (ref 1.9–3.7)
Glucose, Bld: 156 mg/dL — ABNORMAL HIGH (ref 65–99)
Potassium: 4.9 mmol/L (ref 3.5–5.3)
Sodium: 141 mmol/L (ref 135–146)
Total Bilirubin: 0.2 mg/dL (ref 0.2–1.2)
Total Protein: 6.6 g/dL (ref 6.1–8.1)
eGFR: 61 mL/min/{1.73_m2} (ref >=60)

## 2022-06-21 LAB — SEDIMENTATION RATE: Sed Rate: 34 mm/h — ABNORMAL HIGH (ref 0–20)

## 2022-06-21 LAB — GLUCOSE, CAPILLARY: Glucose-Capillary: 265 mg/dL — ABNORMAL HIGH (ref 70–99)

## 2022-06-21 LAB — C-REACTIVE PROTEIN: CRP: 6.8 mg/L (ref <8.0)

## 2022-06-21 NOTE — Progress Notes (Signed)
[name], [name] ([id number]) Visit Report for 06/21/2022 Arrival Information Details Patient Name: [name], [name] Date of Service: 06/21/2022 8:30 AM Medical Record Number: [id number] Patient Account Number: [id number] Date of Birth/Sex: 1966-02-17 (56 y.o. M) Treating RN: Yevonne Pax Primary Care Sabrin Dunlevy: PATIENT, NO Other Clinician: Izetta Dakin Referring Neiman Roots: Duanne Guess Treating Tashanda Fuhrer/Extender: Tilda Franco in Treatment: 0 Visit Information History Since Last Visit Added or deleted any medications: No Patient Arrived: Knee Scooter Any new allergies or adverse reactions: No Arrival Time: 08:05 Had a fall or experienced change in No Accompanied By: self activities of daily living that may affect Transfer Assistance: None risk of falls: Patient Identification Verified: Yes Signs or symptoms of abuse/neglect since last visito No Secondary Verification Process Completed: Yes Hospitalized since last visit: No Patient Requires Transmission-Based Precautions: No Implantable device outside of the clinic excluding No Patient Has Alerts: No cellular tissue based products placed in the center since last visit: Has Dressing in Place as Prescribed: Yes Pain Present Now: No Electronic Signature(s) Signed: 06/21/2022 10:12:36 AM By: Aleda Grana RCP, RRT, CHT Entered By: Aleda Grana on 06/21/2022 09:56:36 [name] ([id number]) -------------------------------------------------------------------------------- Encounter Discharge Information Details Patient Name: [name] Date of Service: 06/21/2022 8:30 AM Medical Record Number: [id number] Patient Account Number: [id number] Date of Birth/Sex: 05-24-1966 (56 y.o. M) Treating RN: Yevonne Pax Primary Care Brya Simerly: PATIENT, NO Other Clinician: Izetta Dakin Referring Alohilani Levenhagen: Duanne Guess Treating Tyliyah Mcmeekin/Extender: Tilda Franco in Treatment: 0 Encounter Discharge Information  Items Discharge Condition: Stable Ambulatory Status: Knee Scooter Discharge Destination: Home Transportation: Private Auto Accompanied By: brother Schedule Follow-up Appointment: No Clinical Summary of Care: Notes Follow up appointment pending decision of Allen Derry, III, PA-C concerning ENT referral. Electronic Signature(s) Signed: 06/21/2022 10:12:36 AM By: Aleda Grana RCP, RRT, CHT Entered By: Aleda Grana on 06/21/2022 10:12:14 [name] ([id number]) -------------------------------------------------------------------------------- Vitals Details Patient Name: [name] Date of Service: 06/21/2022 8:30 AM Medical Record Number: [id number] Patient Account Number: [id number] Date of Birth/Sex: August 10, 1966 (56 y.o. M) Treating RN: Yevonne Pax Primary Care Suan Pyeatt: PATIENT, NO Other Clinician: Izetta Dakin Referring Sarina Robleto: Duanne Guess Treating Favor Kreh/Extender: Tilda Franco in Treatment: 0 Vital Signs Time Taken: 08:07 Temperature (F): 98.3 Height (in): 70 Pulse (bpm): 78 Weight (lbs): 230 Respiratory Rate (breaths/min): 18 Body Mass Index (BMI): 33 Blood Pressure (mmHg): 128/78 Capillary Blood Glucose (mg/dl): 654 Reference Range: 80 - 120 mg / dl Electronic Signature(s) Signed: 06/21/2022 10:12:36 AM By: Aleda Grana RCP, RRT, CHT Entered By: Aleda Grana on 06/21/2022 09:57:26

## 2022-06-21 NOTE — Progress Notes (Addendum)
Matthew, [name] ([id number]) Visit Report for 06/21/2022 HBO Details Patient Name: [name], [name] Date of Service: 06/21/2022 8:30 AM Medical Record Number: [id number] Patient Account Number: [id number] Date of Birth/Sex: [birth year] (56 y.o. M) Treating RN: Yevonne Pax Primary Care Keshanna Riso: PATIENT, NO Other Clinician: Izetta Dakin Referring Mehr Depaoli: Duanne Guess Treating Andie Mortimer/Extender: Tilda Franco in Treatment: 0 HBO Treatment Course Details Treatment Course Number: 1 Ordering Bader Stubblefield: Allen Derry Total Treatments Ordered: 40 HBO Treatment Start Date: 06/19/2022 HBO Indication: Diabetic Ulcer(s) of the Lower Extremity Standard/Conservative Wound Care tried and failed greater than or equal to 30 days Wound #1 Plantar Foot HBO Treatment Details Treatment Number: Treatment Aborted/Not Restarted: Adverse Event Patient Type: Outpatient Chamber Type: Monoplace Chamber Serial #: [id number] Treatment Protocol: 2.5 ATA with 90 minutes oxygen, with two 5 minute air breaks Treatment Details Compression Rate Down: 1.0 psi / minute De-Compression Rate Up: 2.0 psi / minute Compress Tx Pressure Air breaks and breathing periods Decompress Decompress Begins Reached (leave unused spaces blank) Begins Ends Chamber Pressure (ATA) 1 2.5 2.5 2.5 2.5 2.5 - - 2.5 1 Clock Time (24 hr) 08:35 - - - - - - - 08:38 08:46 Treatment Length: 11 (minutes) Treatment Segments: 0 Vital Signs Capillary Blood Glucose Reference Range: 80 - 120 mg / dl HBO Diabetic Blood Glucose Intervention Range: <131 mg/dl or >086 mg/dl Time Vitals Blood Respiratory Capillary Blood Glucose Pulse Action Type: Pulse: Temperature: Taken: Pressure: Rate: Glucose (mg/dl): Meter #: Oximetry (%) Taken: Pre 08:07 128/78 78 18 98.3 265 1 non per protocol Treatment Response Treatment Toleration: Poor Adverse Events: 1:Barotrauma - Sinus Treatment Completion Treatment Aborted/Not Restarted Status: Reason: Adverse  Event Treatment Notes At 1.40 patient complained of pain across the top of his head radiating down to his ears. He stated it was not as intense as yesterday. Geralyn Corwin, MD was notified and confirmed to bring him up and out of the chamber due to his symptoms. Sonam Huelsmann did not come into the room. Allen Derry, III, PA-C was also notified as the ordering Ameia Morency. I told the patient that we would contact him about a possible ENT Referral in [place] (closer to his home). Dr. Mikey Bussing- notified patient had sinus pain during his dive. I recommended ENT referral. Vital stables. Patient alert and oriented. HBO Attestation I certify that I supervised this HBO treatment in accordance with Medicare guidelines. A trained emergency response team is readily Yes available per hospital policies and procedures. Continue HBOT as ordered. Matthew, [name] ([id number]) Electronic Signature(s) Signed: 06/23/2022 3:14:20 PM By: Geralyn Corwin DO Previous Signature: 06/23/2022 9:51:29 AM Version By: Aleda Grana RCP, RRT, CHT Previous Signature: 06/21/2022 10:12:36 AM Version By: Aleda Grana RCP, RRT, CHT Previous Signature: 06/21/2022 12:03:59 PM Version By: Geralyn Corwin DO Previous Signature: 06/21/2022 10:09:57 AM Version By: Aleda Grana RCP, RRT, CHT Entered By: Geralyn Corwin on 06/23/2022 15:14:06 [name] ([id number]) -------------------------------------------------------------------------------- HBO Safety Checklist Details Patient Name: [name] Date of Service: 06/21/2022 8:30 AM Medical Record Number: [id number] Patient Account Number: [id number] Date of Birth/Sex: 06/25/1966 (56 y.o. M) Treating RN: Yevonne Pax Primary Care Nilay Mangrum: PATIENT, NO Other Clinician: Izetta Dakin Referring Clydell Sposito: Duanne Guess Treating Kennedi Lizardo/Extender: Tilda Franco in Treatment: 0 HBO Safety Checklist Items Safety Checklist Consent Form  Signed Patient voided / foley secured and emptied When did you last eato am Last dose of injectable or oral agent 06/20/22 pm Ostomy pouch emptied and vented if applicable NA All implantable devices assessed, documented and approved NA Intravenous  access site secured and place NA Valuables secured Linens and cotton and cotton/polyester blend (less than 51% polyester) Personal oil-based products / skin lotions / body lotions removed Wigs or hairpieces removed NA Smoking or tobacco materials removed NA Books / newspapers / magazines / loose paper removed NA Cologne, aftershave, perfume and deodorant removed Jewelry removed (may wrap wedding band) Make-up removed NA Hair care products removed Battery operated devices (external) removed NA Heating patches and chemical warmers removed NA Titanium eyewear removed NA Nail polish cured greater than 10 hours NA Casting material cured greater than 10 hours NA Hearing aids removed NA Loose dentures or partials removed NA Prosthetics have been removed NA Patient demonstrates correct use of air break device (if applicable) Patient concerns have been addressed Patient grounding bracelet on and cord attached to chamber Specifics for Inpatients (complete in addition to above) Medication sheet sent with patient Intravenous medications needed or due during therapy sent with patient Drainage tubes (e.g. nasogastric tube or chest tube secured and vented) Endotracheal or Tracheotomy tube secured Cuff deflated of air and inflated with saline Airway suctioned Electronic Signature(s) Signed: 06/21/2022 10:12:36 AM By: Aleda Grana RCP, RRT, CHT Entered By: Drake Leach, Lucio Edward on 06/21/2022 09:58:44

## 2022-06-22 ENCOUNTER — Encounter: Payer: 59 | Admitting: Physician Assistant

## 2022-06-22 ENCOUNTER — Ambulatory Visit (INDEPENDENT_AMBULATORY_CARE_PROVIDER_SITE_OTHER): Payer: 59 | Admitting: Orthopedic Surgery

## 2022-06-22 DIAGNOSIS — L97524 Non-pressure chronic ulcer of other part of left foot with necrosis of bone: Secondary | ICD-10-CM | POA: Diagnosis not present

## 2022-06-22 DIAGNOSIS — E1169 Type 2 diabetes mellitus with other specified complication: Secondary | ICD-10-CM | POA: Diagnosis not present

## 2022-06-22 DIAGNOSIS — M86672 Other chronic osteomyelitis, left ankle and foot: Secondary | ICD-10-CM | POA: Diagnosis not present

## 2022-06-22 DIAGNOSIS — E11621 Type 2 diabetes mellitus with foot ulcer: Secondary | ICD-10-CM | POA: Diagnosis not present

## 2022-06-22 DIAGNOSIS — L97424 Non-pressure chronic ulcer of left heel and midfoot with necrosis of bone: Secondary | ICD-10-CM

## 2022-06-22 DIAGNOSIS — R69 Illness, unspecified: Secondary | ICD-10-CM | POA: Diagnosis not present

## 2022-06-22 MED ORDER — OXYCODONE HCL 5 MG PO TABS
5.0000 mg | ORAL_TABLET | ORAL | 0 refills | Status: DC | PRN
Start: 2022-06-22 — End: 2022-07-20

## 2022-06-22 NOTE — Progress Notes (Addendum)
[name], [name] ([id number]) Visit Report for 06/22/2022 Chief Complaint Document Details Patient Name: [name], [name] Date of Service: 06/22/2022 1:15 PM Medical Record Number: [id number] Patient Account Number: [id number] Date of Birth/Sex: March 19, 1966 (56 y.o. 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 M) Treating RN: Carlene Coria Primary Care Provider: PATIENT, NO Other Clinician: Referring Provider: Fredirick Maudlin Treating Provider/Extender: Skipper Cliche in Treatment: 1 History of Present Illness HPI Description: Notes from [facility] under Dr. Glenford Peers care:   ADMISSION6/15/2023This is a 56 year old poorly controlled type II diabetic (last A1c 11.5%). In January of this year, he presented to the hospital with an abscess in his left foot. He also had a foreign body present. It was removed in the ER and he was admitted to the hospital for IV antibiotics. He was subsequently discharged on oral antibiotics. Due to financial concerns, he has not taken his diabetic medications (metformin and 70/30 insulin) and as a result, presented back to the emergency room on June 1 with worsening findings, including frank pus and osteomyelitis on MRI. BKA was recommended, but he declined. He was taken to the operating  room by Dr. Sharol Given who performed an extensive debridement. Cultures were taken and he was placed on IV antibiotics. He saw infectious disease while in the hospital who prescribed a 6-week course of IV antibiotics including cefazolin as well as oral metronidazole and levofloxacin. The infectious disease provider also indicated that he would benefit from below-knee amputation, but the patient desired another opinion and therefore he has been referred to wound care center for further evaluation and management. ABIs obtained while he was in the hospital demonstrate adequate blood flow. Essentially the entire bottom of the patient's left foot has been removed. The heads of the majority of his metatarsal bones are exposed along with their accompanying tendons. Muscle and fat are exposed as well. The wound surface is fairly dry; the patient reports that he was not given any specific wound care instructions other than to place a dry dressing on it after showering. There is no odor or purulent drainage identified. 05/26/2022: The wound measures a little bit smaller today. There is some good granulation tissue beginning to form on the surface. He still has exposed bone and tendon. There is some slough accumulation. He saw infectious disease earlier this week and was frustrated that they recommended amputation. He is interested in another surgical opinion. He continues to smoke but says that he has cut down. He is using a knee scooter for mobility so that he can stay off of his foot. 06/05/2022: He saw Dr. Sharol Given last week who was supportive of our efforts to try to salvage the foot. I referred him to vascular surgery to see Dr. Carlis Abbott but he has not received an appointment yet. He continues on IV antibiotics. He says that after our conversation last week about him being unable to initiate hyperbarics if he was going to continue to smoke, he has not had a cigarette since. He is now at 4 weeks of  conventional management  and his insurance was initiated on July 1. Today, the wound continues to show evidence of improvement. There is good granulation tissue forming. The metatarsals are still exposed but actually have some pink periosteum. There is still some slough and fibrinous exudate present. No odor or purulent drainage. We have just been using Dakin's dressing changes for now. Admission to [place] at [facility]: 06-15-2022 upon evaluation today patient presents for initial inspection here in our clinic though he has been seen by Dr. Celine Ahr in [place] up to this point. I did review her notes and records as well they have been using Dakin's moistened gauze dressing which has done a great job at helping to clean out the bottom of his foot. Please see the discourse above for additional information in regard to treatment course up until the patient arrives here in the [facility] today. The patient is ready to get started with HBO therapy as soon as possible I do think based on all my review of his records this seems to be appropriate he does have chronic osteomyelitis is also been offered amputation this is obviously a limb salvage operation here. We are trying to prevent him from having to undergo an extensive amputation which would likely be a below-knee amputation which in turn is going to affect his quality of life he wants to try to avoid that if at all possible I think hyperbaric oxygen therapy is his best bet to achieve that goal. Currently he has been on IV Ancef. That actually ends tomorrow. Following that according to notes it appears he supposed to be taking Levaquin and Flagyl for an additional period of time although I am not certain exactly how long that with the. He has been seeing regional physicians infectious disease in [place] and again that so I recommended that he contact today in order to find out what the plan is going forward. His most recent hemoglobin A1c as noted above was 11.5  on 05-07-2022 he tells me trying to work on that he is also quit smoking as of this point. 06-22-2022 upon evaluation today patient appears to be doing well currently in regard to his wound all things considered. He is showing signs of a lot of new granulation tissue and overall very pleased. He is changing this once a day with the Dakin's moistened gauze dressing. Fortunately I do not see any evidence of infection locally or systemically at this time. We have been trying to get him into the hyperbaric chamber he was having a lot of issues with sinus pressure and we have made referral and called multiple ENT specialist the earliest we have been able to get his August 9 at [facility] ear nose throat. They are going to try to work him in sooner if they have any opportunities to do so. Obviously I think the sooner he can do this the better. We need to get him in hyperbarics as quickly as possible. Electronic Signature(s) Signed: 06/22/2022 2:29:23 PM By: Worthy Keeler PA-C Entered By: Worthy Keeler on 06/22/2022 14:29:23 [name], [name] ([id number]) -------------------------------------------------------------------------------- Physical Exam Details Patient Name: [name] Date of Service: 06/22/2022 1:15 PM Medical Record Number: [id number] Patient Account Number: [id number] Date of Birth/Sex: [birth year] (56 y.o. M) Treating RN: Carlene Coria Primary Care Provider: PATIENT, NO Other Clinician: Referring Provider: Fredirick Maudlin Treating Provider/Extender: Skipper Cliche in Treatment: 1 Constitutional Well-nourished and well-hydrated in no acute distress. Respiratory normal breathing without difficulty. Psychiatric this patient is able  to make decisions and demonstrates good insight into disease process. Alert and Oriented x 3. pleasant and cooperative. Notes Upon inspection patient's wound bed actually showed signs still of having been exposed in the second, third, and fourth metatarsal  regions. Fortunately I do not see any evidence of infection locally or systemically at this time. Electronic Signature(s) Signed: 06/22/2022 2:29:41 PM By: Worthy Keeler PA-C Entered By: Worthy Keeler on 06/22/2022 14:29:41 [name] ([id number]) -------------------------------------------------------------------------------- Physician Orders Details Patient Name: [name] Date of Service: 06/22/2022 1:15 PM Medical Record Number: [id number] Patient Account Number: [id number] Date of Birth/Sex: [birth year] (56 y.o. M) Treating RN: Carlene Coria Primary Care Provider: PATIENT, NO Other Clinician: Referring Provider: Fredirick Maudlin Treating Provider/Extender: Skipper Cliche in Treatment: 1 Verbal / Phone Orders: No Diagnosis Coding ICD-10 Coding Code Description [phone] Other chronic osteomyelitis, left ankle and foot E11.621 Type 2 diabetes mellitus with foot ulcer L97.524 Non-pressure chronic ulcer of other part of left foot with necrosis of bone Follow-up Appointments o Return Appointment in 1 week. Bathing/ Shower/ Hygiene o May shower; gently cleanse wound with antibacterial soap, rinse and pat dry prior to dressing wounds Edema Control - Lymphedema / Segmental Compressive Device / Other o Elevate, Exercise Daily and Avoid Standing for Long Periods of Time. o Elevate legs to the level of the heart and pump ankles as often as possible o Elevate leg(s) parallel to the floor when sitting. Hyperbaric Oxygen Therapy o Evaluate for HBO Therapy o Indication and location: - osteomyelitis left foot o 2.5 ATA for 90 Minutes with 2 Five (5) Minute Air Breaks - total 40 treatments o Total # of Treatments: - 40 o Antihistamine 30 minutes prior to HBO Treatment, difficulty clearing ears. o Finger stick Blood Glucose Pre- and Post- HBOT Treatment. o Follow Hyperbaric Oxygen Glycemia Protocol Wound Treatment Wound #1 - Foot Wound Laterality: Plantar,  Left Cleanser: [place] Ancillary Kit - 15 Day Supply (DME) (Generic) 1 x Per Day/30 Days Discharge Instructions: Use supplies as instructed; Kit contains: (15) Saline Bullets; (15) 3x3 Gauze; 15 pr Gloves Cleanser: Dakin 16 (oz) 0.25 1 x Per Day/30 Days Discharge Instructions: Use as directed. Primary Dressing: Gauze (DME) (Generic) 1 x Per Day/30 Days Discharge Instructions: moistened with 1/4 Dakins Solution Primary Dressing: [employer] Oil Emulsion Dressings, 3x8 (in/in) (Generic) 1 x Per Day/30 Days Discharge Instructions: cover exposed bone Secondary Dressing: ABD Pad 5x9 (in/in) (Generic) 1 x Per Day/30 Days Discharge Instructions: Cover with ABD pad Secondary Dressing: Kerlix 4.5 x 4.1 (in/yd) (DME) (Generic) 1 x Per Day/30 Days Discharge Instructions: Apply Kerlix 4.5 x 4.1 (in/yd) as instructed Secured With: Medipore Tape - 12M Medipore H Soft Cloth Surgical Tape, 2x2 (in/yd) (Generic) 1 x Per Day/30 Days GLYCEMIA INTERVENTIONS PROTOCOL PRE-HBO GLYCEMIA INTERVENTIONS ACTION INTERVENTION Obtain pre-HBO capillary blood glucose (ensure 1 physician order is in chart). [name], [name] ([id number]) 2 If result is 70 mg/dl or below: A. Notify HBO physician and await physician orders. B. If the result meets the hospital definition of a critical result, follow hospital policy. A. Give patient an 8 ounce Glucerna Shake, an 8 ounce Ensure, or 8 ounces of a Glucerna/Ensure equivalent dietary supplement*. B. Wait 30 minutes. If result is 71 mg/dl to 130 mg/dl: C. Retest patientos capillary blood glucose (CBG). D. If result greater than or equal to 110 mg/dl, proceed with HBO. If result less than 110 mg/dl, notify HBO physician and consider holding HBO. If result is 131 mg/dl to 249 mg/dl: A. Proceed with  HBO. A. Notify HBO physician and await physician orders. B. It is recommended to hold HBO and do If result is 250 mg/dl or greater: blood/urine ketone testing. C. If the result  meets the hospital definition of a critical result, follow hospital policy. POST-HBO GLYCEMIA INTERVENTIONS ACTION INTERVENTION Obtain post HBO capillary blood glucose (ensure 1 physician order is in chart). A. Notify HBO physician and await physician orders. 2 If result is 70 mg/dl or below: B. If the result meets the hospital definition of a critical result, follow hospital policy. A. Give patient an 8 ounce Glucerna Shake, an 8 ounce Ensure, or 8 ounces of a Glucerna/Ensure equivalent dietary supplement*. B. Wait 15 minutes for symptoms of hypoglycemia (i.e. nervousness, anxiety, If result is 71 mg/dl to 100 mg/dl: sweating, chills, clamminess, irritability, confusion, tachycardia or dizziness). C. If patient asymptomatic, discharge patient. If patient symptomatic, repeat capillary blood glucose (CBG) and notify HBO physician. If result is 101 mg/dl to 249 mg/dl: A. Discharge patient. A. Notify HBO physician and await physician orders. B. It is recommended to do blood/urine If result is 250 mg/dl or greater: ketone testing. C. If the result meets the hospital definition of a critical result, follow hospital policy. *Juice or candies are NOT equivalent products. If patient refuses the Glucerna or Ensure, please consult the hospital dietitian for an appropriate substitute. Electronic Signature(s) Signed: 06/22/2022 5:07:46 PM By: Worthy Keeler PA-C Signed: 06/27/2022 3:54:37 PM By: Carlene Coria RN Entered By: Carlene Coria on 06/22/2022 14:33:30 [name] ([id number]) -------------------------------------------------------------------------------- Problem List Details Patient Name: [name] Date of Service: 06/22/2022 1:15 PM Medical Record Number: [id number] Patient Account Number: [id number] Date of Birth/Sex: May 11, 1966 (56 y.o. M) Treating RN: Carlene Coria Primary Care Provider: PATIENT, NO Other Clinician: Referring Provider: Fredirick Maudlin Treating  Provider/Extender: Skipper Cliche in Treatment: 1 Active Problems ICD-10 Encounter Code Description Active Date MDM Diagnosis [phone] Other chronic osteomyelitis, left ankle and foot 06/15/2022 No Yes E11.621 Type 2 diabetes mellitus with foot ulcer 06/15/2022 No Yes L97.524 Non-pressure chronic ulcer of other part of left foot with necrosis of 06/15/2022 No Yes bone Inactive Problems Resolved Problems Electronic Signature(s) Signed: 06/23/2022 4:04:09 PM By: Carlene Coria RN Signed: 06/23/2022 4:22:23 PM By: Worthy Keeler PA-C Previous Signature: 06/22/2022 1:29:50 PM Version By: Worthy Keeler PA-C Entered By: Carlene Coria on 06/23/2022 16:04:09 [name] ([id number]) -------------------------------------------------------------------------------- Progress Note Details Patient Name: [name] Date of Service: 06/22/2022 1:15 PM Medical Record Number: [id number] Patient Account Number: [id number] Date of Birth/Sex: 09-08-1966 (56 y.o. 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 Glenford Peers care:   ADMISSION6/15/2023This is a 78 year old poorly controlled type II diabetic (last A1c 11.5%). In January of this year, he presented to the hospital with an abscess in his left foot. He also had a foreign body present. It was removed in the ER and he was admitted to the hospital for IV antibiotics. He was subsequently discharged on oral antibiotics. Due to financial concerns, he has not taken his diabetic medications (metformin and 70/30 insulin) and as a result, presented back to the emergency room on June 1 with worsening findings,  including frank pus and osteomyelitis on MRI. BKA was recommended, but he declined. He was taken  to the operating room by Dr. Sharol Given who performed an extensive debridement. Cultures were taken and he was placed on IV antibiotics. He saw infectious disease while in the hospital who prescribed a 6-week course of IV antibiotics including cefazolin as well as oral metronidazole and levofloxacin. The infectious disease provider also indicated that he would benefit from below-knee amputation, but the patient desired another opinion and therefore he has been referred to wound care center for further evaluation and management. ABIs obtained while he was in the hospital demonstrate adequate blood flow. Essentially the entire bottom of the patient's left foot has been removed. The heads of the majority of his metatarsal bones are exposed along with their accompanying tendons. Muscle and fat are exposed as well. The wound surface is fairly dry; the patient reports that he was not given any specific wound care instructions other than to place a dry dressing on it after showering. There is no odor or purulent drainage identified. 05/26/2022: The wound measures a little bit smaller today. There is some good granulation tissue beginning to form on the surface. He still has exposed bone and tendon. There is some slough accumulation. He saw infectious disease earlier this week and was frustrated that they recommended amputation. He is interested in another surgical opinion. He continues to smoke but says that he has cut down. He is using a knee scooter for mobility so that he can stay off of his foot. 06/05/2022: He saw Dr. Sharol Given last week who was supportive of our efforts to try to salvage the foot. I referred him to vascular surgery to see Dr. Carlis Abbott but he has not received an appointment yet. He continues on IV antibiotics. He says that after our conversation last week about him being unable to initiate hyperbarics if he  was going to continue to smoke, he has not had a cigarette since. He is now at 4 weeks of conventional management and his insurance was initiated on July 1. Today, the wound continues to show evidence of improvement. There is good granulation tissue forming. The metatarsals are still exposed but actually have some pink periosteum. There is still some slough and fibrinous exudate present. No odor or purulent drainage. We have just been using Dakin's dressing changes for now. Admission to [place] [facility]: 06-15-2022 upon evaluation today patient presents for initial inspection here in our clinic though he has been seen by Dr. Celine Ahr in [place] up to this point. I did review her notes and records as well they have been using Dakin's moistened gauze dressing which has done a great job at helping to clean out the bottom of his foot. Please see the discourse above for additional information in regard to treatment course up until the patient arrives here in the [facility] clinic today. The patient is ready to get started with HBO therapy as soon as possible I do think based on all my review of his records this seems to be appropriate he does have chronic osteomyelitis is also been offered amputation this is obviously a limb salvage operation here. We are trying to prevent him from having to undergo an extensive amputation which would likely be a below-knee amputation which in turn is going to affect his quality of life he wants to try to avoid that if at all possible I think hyperbaric oxygen therapy is his best bet to achieve that goal. Currently he has been on IV Ancef. That actually ends tomorrow. Following that according to notes it appears  he supposed to be taking Levaquin and Flagyl for an additional period of time although I am not certain exactly how long that with the. He has been seeing regional physicians infectious disease in [place] and again that so I recommended that he contact  today in order to find out what the plan is going forward. His most recent hemoglobin A1c as noted above was 11.5 on 05-07-2022 he tells me trying to work on that he is also quit smoking as of this point. 06-22-2022 upon evaluation today patient appears to be doing well currently in regard to his wound all things considered. He is showing signs of a lot of new granulation tissue and overall very pleased. He is changing this once a day with the Dakin's moistened gauze dressing. Fortunately I do not see any evidence of infection locally or systemically at this time. We have been trying to get him into the hyperbaric chamber he was having a lot of issues with sinus pressure and we have made referral and called multiple ENT specialist the earliest we have been able to get his August 9 at [facility] ear nose throat. They are going to try to work him in sooner if they have any opportunities to do so. Obviously I think the sooner he can do this the better. We need to get him in hyperbarics as quickly as possible. [name], [name] ([id number]) Objective Constitutional Well-nourished and well-hydrated in no acute distress. Vitals Time Taken: 1:28 PM, Height: 70 in, Weight: 230 lbs, BMI: 33, Temperature: 99.5 F, Pulse: 90 bpm, Respiratory Rate: 18 breaths/min, Blood Pressure: 142/77 mmHg. Respiratory normal breathing without difficulty. Psychiatric this patient is able to make decisions and demonstrates good insight into disease process. Alert and Oriented x 3. pleasant and cooperative. General Notes: Upon inspection patient's wound bed actually showed signs still of having been exposed in the second, third, and fourth metatarsal regions. Fortunately I do not see any evidence of infection locally or systemically at this time. Integumentary (Hair, Skin) Wound #1 status is Open. Original cause of wound was Gradually Appeared. The date acquired was: 12/04/2021. The wound has been in treatment 1 weeks. The wound is  located on the [place]. The wound measures 14cm length x 8.5cm width x 0.5cm depth; 93.462cm^2 area and 46.731cm^3 volume. There is bone, muscle, tendon, Fat Layer (Subcutaneous Tissue), and fascia exposed. There is no tunneling or undermining noted. There is a medium amount of serosanguineous drainage noted. There is large (67-100%) red granulation within the wound bed. There is a small (1-33%) amount of necrotic tissue within the wound bed including Adherent Slough and Necrosis of Muscle. Assessment Active Problems ICD-10 Other chronic osteomyelitis, left ankle and foot Type 2 diabetes mellitus with foot ulcer Non-pressure chronic ulcer of other part of left foot with necrosis of bone Plan 1. I would recommend currently that we go and continue with the wound care measures as before and the patient is in agreement with plan this includes the use of the Dakin's moistened gauze dressing were using the oil emulsion dressing1. At the base of where the exposed bones are and subsequently the patient seems to be doing excellent in that regard keeping this from drying out and again there is a lot of new granulation tissue filling in which is awesome. 2. I am going to recommend he continue to change this daily is going to keep it clean and dry air and overall I think that he is really doing quite well. 3. We will  still try to get him into ENT as quickly as possible so we can restart the hyperbarics. With that being said we have had trouble getting an appointment for him hopefully will be able to make this happen shortly. We will see patient back for reevaluation in 1 week here in the clinic. If anything worsens or changes patient will contact our office for additional recommendations. Electronic Signature(s) Signed: 06/22/2022 2:30:46 PM By: Worthy Keeler PA-C Entered By: Worthy Keeler on 06/22/2022 14:30:46 [name]  ([id number]) -------------------------------------------------------------------------------- SuperBill Details Patient Name: [name] Date of Service: 06/22/2022 Medical Record Number: [id number] Patient Account Number: [id number] Date of Birth/Sex: 11/25/1966 (56 y.o. M) Treating RN: Carlene Coria Primary Care Provider: PATIENT, NO Other Clinician: Referring Provider: Fredirick Maudlin Treating Provider/Extender: Skipper Cliche in Treatment: 1 Diagnosis Coding ICD-10 Codes Code Description [id number] Other chronic osteomyelitis, left ankle and foot E11.621 Type 2 diabetes mellitus with foot ulcer L97.524 Non-pressure chronic ulcer of other part of left foot with necrosis of bone Facility Procedures CPT4 Code: [id number] Description: 99213 - WOUND CARE VISIT-LEV 3 EST PT Modifier: Quantity: 1 Physician Procedures CPT4 Code: 7354301 Description: 48403 - WC PHYS LEVEL 3 - EST PT Modifier: Quantity: 1 CPT4 Code: Description: ICD-10 Diagnosis Description 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

## 2022-06-23 ENCOUNTER — Encounter: Payer: 59 | Admitting: Physician Assistant

## 2022-06-25 ENCOUNTER — Ambulatory Visit (HOSPITAL_COMMUNITY): Payer: 59

## 2022-06-26 ENCOUNTER — Telehealth: Payer: Self-pay

## 2022-06-26 DIAGNOSIS — E11621 Type 2 diabetes mellitus with foot ulcer: Secondary | ICD-10-CM | POA: Diagnosis not present

## 2022-06-26 NOTE — Telephone Encounter (Signed)
Patient called and stated he presented for his MRI appointment on Sunday at [facility], and was told there was no record of his appointment. Calling for advice on next steps to reschedule.  Referral coordinator called and rescheduled patient for Thursday. Patient aware of new appointment day/time.  Wyvonne Lenz, RN

## 2022-06-27 ENCOUNTER — Telehealth: Payer: Self-pay | Admitting: Orthopedic Surgery

## 2022-06-27 ENCOUNTER — Other Ambulatory Visit: Payer: Self-pay | Admitting: Family

## 2022-06-27 ENCOUNTER — Other Ambulatory Visit: Payer: Self-pay

## 2022-06-27 MED ORDER — METHOCARBAMOL 500 MG PO TABS: 500.0000 mg | ORAL_TABLET | Freq: Four times a day (QID) | ORAL | 0 refills | Status: DC | PRN

## 2022-06-27 MED ORDER — METHOCARBAMOL 500 MG PO TABS
500.0000 mg | ORAL_TABLET | Freq: Four times a day (QID) | ORAL | 0 refills | Status: DC | PRN
  Filled 2022-06-27: qty 30, 8d supply, fill #0

## 2022-06-27 NOTE — Telephone Encounter (Signed)
Robaxin sent

## 2022-06-27 NOTE — Telephone Encounter (Signed)
Pt informed

## 2022-06-27 NOTE — Progress Notes (Signed)
DEVEON, KISIEL ([id number]) Visit Report for 06/22/2022 Arrival Information Details Patient Name: [name], [name] Date of Service: 06/22/2022 1:15 PM Medical Record Number: [id number] Patient Account Number: [id number] Date of Birth/Sex: [birth year] (56 y.o. M) Treating RN: Carlene Coria Primary Care Elzada Pytel: PATIENT, NO Other Clinician: Referring Arrian Manson: Fredirick Maudlin Treating Candence Sease/Extender: Skipper Cliche in Treatment: 1 Visit Information History Since Last Visit All ordered tests and consults were completed: No Patient Arrived: Knee Scooter Added or deleted any medications: No Arrival Time: 13:25 Any new allergies or adverse reactions: No Accompanied By: self Had a fall or experienced change in No Transfer Assistance: None activities of daily living that may affect Patient Identification Verified: Yes risk of falls: Secondary Verification Process Completed: Yes Signs or symptoms of abuse/neglect since last visito No Patient Requires Transmission-Based Precautions: No Hospitalized since last visit: No Patient Has Alerts: No Implantable device outside of the clinic excluding No cellular tissue based products placed in the center since last visit: Has Dressing in Place as Prescribed: Yes Pain Present Now: Yes Electronic Signature(s) Signed: 06/27/2022 3:54:37 PM By: Carlene Coria RN Entered By: Carlene Coria on 06/22/2022 13:28:41 [name] ([id number]) -------------------------------------------------------------------------------- Clinic Level of Care Assessment Details Patient Name: JONTA, GASTINEAU Date of Service: 06/22/2022 1:15 PM Medical Record Number: [id number] Patient Account Number: [id number] Date of Birth/Sex: Oct 26, 1966 (56 y.o. M) Treating RN: Carlene Coria Primary Care Odelle Kosier: PATIENT, NO Other Clinician: Referring Cevin Rubinstein: Fredirick Maudlin Treating Manisha Cancel/Extender: Skipper Cliche in Treatment: 1 Clinic Level of Care Assessment Items TOOL 4 Quantity  Score X - Use when only an EandM is performed on FOLLOW-UP visit 1 0 ASSESSMENTS - Nursing Assessment / Reassessment X - Reassessment of Co-morbidities (includes updates in patient status) 1 10 X- 1 5 Reassessment of Adherence to Treatment Plan ASSESSMENTS - Wound and Skin Assessment / Reassessment X - Simple Wound Assessment / Reassessment - one wound 1 5 []  - 0 Complex Wound Assessment / Reassessment - multiple wounds []  - 0 Dermatologic / Skin Assessment (not related to wound area) ASSESSMENTS - Focused Assessment []  - Circumferential Edema Measurements - multi extremities 0 []  - 0 Nutritional Assessment / Counseling / Intervention []  - 0 Lower Extremity Assessment (monofilament, tuning fork, pulses) []  - 0 Peripheral Arterial Disease Assessment (using hand held doppler) ASSESSMENTS - Ostomy and/or Continence Assessment and Care []  - Incontinence Assessment and Management 0 []  - 0 Ostomy Care Assessment and Management (repouching, etc.) PROCESS - Coordination of Care X - Simple Patient / Family Education for ongoing care 1 15 []  - 0 Complex (extensive) Patient / Family Education for ongoing care X- 1 10 Staff obtains Programmer, systems, Records, Test Results / Process Orders []  - 0 Staff telephones HHA, Nursing Homes / Clarify orders / etc []  - 0 Routine Transfer to another Facility (non-emergent condition) []  - 0 Routine Hospital Admission (non-emergent condition) []  - 0 New Admissions / Biomedical engineer / Ordering NPWT, Apligraf, etc. []  - 0 Emergency Hospital Admission (emergent condition) X- 1 10 Simple Discharge Coordination []  - 0 Complex (extensive) Discharge Coordination PROCESS - Special Needs []  - Pediatric / Minor Patient Management 0 []  - 0 Isolation Patient Management []  - 0 Hearing / Language / Visual special needs []  - 0 Assessment of Community assistance (transportation, D/C planning, etc.) []  - 0 Additional assistance / Altered mentation []  -  0 Support Surface(s) Assessment (bed, cushion, seat, etc.) INTERVENTIONS - Wound Cleansing / Measurement Godman, Ilario ([id number]) X- 1 5 Simple Wound Cleansing - one wound []  -  0 Complex Wound Cleansing - multiple wounds X- 1 5 Wound Imaging (photographs - any number of wounds) []  - 0 Wound Tracing (instead of photographs) X- 1 5 Simple Wound Measurement - one wound []  - 0 Complex Wound Measurement - multiple wounds INTERVENTIONS - Wound Dressings []  - Small Wound Dressing one or multiple wounds 0 X- 1 15 Medium Wound Dressing one or multiple wounds []  - 0 Large Wound Dressing one or multiple wounds []  - 0 Application of Medications - topical []  - 0 Application of Medications - injection INTERVENTIONS - Miscellaneous []  - External ear exam 0 []  - 0 Specimen Collection (cultures, biopsies, blood, body fluids, etc.) []  - 0 Specimen(s) / Culture(s) sent or taken to Lab for analysis []  - 0 Patient Transfer (multiple staff / Civil Service fast streamer / Similar devices) []  - 0 Simple Staple / Suture removal (25 or less) []  - 0 Complex Staple / Suture removal (26 or more) []  - 0 Hypo / Hyperglycemic Management (close monitor of Blood Glucose) []  - 0 Ankle / Brachial Index (ABI) - do not check if billed separately X- 1 5 Vital Signs Has the patient been seen at the hospital within the last three years: Yes Total Score: 90 Level Of Care: New/Established - Level 3 Electronic Signature(s) Signed: 06/27/2022 3:54:37 PM By: Carlene Coria RN Entered By: Carlene Coria on 06/23/2022 16:03:40 [name] ([id number]) -------------------------------------------------------------------------------- Encounter Discharge Information Details Patient Name: [name] Date of Service: 06/22/2022 1:15 PM Medical Record Number: [id number] Patient Account Number: [id number] Date of Birth/Sex: 1966-05-14 (56 y.o. M) Treating RN: Carlene Coria Primary Care Zeplin Aleshire: PATIENT, NO Other Clinician: Referring  Haddy Mullinax: Fredirick Maudlin Treating Taydem Cavagnaro/Extender: Skipper Cliche in Treatment: 1 Encounter Discharge Information Items Discharge Condition: Stable Ambulatory Status: Knee Scooter Discharge Destination: Home Transportation: Private Auto Accompanied By: self Schedule Follow-up Appointment: Yes Clinical Summary of Care: Electronic Signature(s) Signed: 06/23/2022 4:05:48 PM By: Carlene Coria RN Entered By: Carlene Coria on 06/23/2022 16:05:47 [name] ([id number]) -------------------------------------------------------------------------------- Lower Extremity Assessment Details Patient Name: [name] Date of Service: 06/22/2022 1:15 PM Medical Record Number: [id number] Patient Account Number: [id number] Date of Birth/Sex: May 11, 1966 (56 y.o. M) Treating RN: Carlene Coria Primary Care Merly Hinkson: PATIENT, NO Other Clinician: Referring Finnean Cerami: Fredirick Maudlin Treating Diana Davenport/Extender: Skipper Cliche in Treatment: 1 Edema Assessment Assessed: [Left: No] [Right: No] Edema: [Left: Ye] [Right: s] Calf Left: Right: Point of Measurement: 32 cm From Medial Instep 41 cm Ankle Left: Right: Point of Measurement: 11 cm From Medial Instep 25.5 cm Vascular Assessment Pulses: Dorsalis Pedis Palpable: [Left:Yes Yes] Electronic Signature(s) Signed: 06/27/2022 3:54:37 PM By: Carlene Coria RN Entered By: Carlene Coria on 06/22/2022 13:40:26 [name] ([id number]) -------------------------------------------------------------------------------- Multi Wound Chart Details Patient Name: [name] Date of Service: 06/22/2022 1:15 PM Medical Record Number: [id number] Patient Account Number: [id number] Date of Birth/Sex: 1966/05/30 (56 y.o. M) Treating RN: Carlene Coria Primary Care Rosenda Geffrard: PATIENT, NO Other Clinician: Referring Juno Bozard: Fredirick Maudlin Treating Kenny Rea/Extender: Skipper Cliche in Treatment: 1 Vital Signs Height(in): 70 Pulse(bpm): 90 Weight(lbs): 230 Blood  Pressure(mmHg): 142/77 Body Mass Index(BMI): 33 Temperature(F): 99.5 Respiratory Rate(breaths/min): 18 Photos: [N/A:N/A] Wound Location: Left, Plantar Foot N/A N/A Wounding Event: Gradually Appeared N/A N/A Primary Etiology: Diabetic Wound/Ulcer of the Lower N/A N/A Extremity Comorbid History: Type II Diabetes N/A N/A Date Acquired: 12/04/2021 N/A N/A Weeks of Treatment: 1 N/A N/A Wound Status: Open N/A N/A Wound Recurrence: No N/A N/A Pending Amputation on Yes N/A N/A Presentation: Measurements L x W x D (cm)  14x8.5x0.5 N/A N/A Area (cm) : 93.462 N/A N/A Volume (cm) : 46.731 N/A N/A % Reduction in Area: 7.00% N/A N/A % Reduction in Volume: 7.00% N/A N/A Classification: Grade 4 N/A N/A Exudate Amount: Medium N/A N/A Exudate Type: Serosanguineous N/A N/A Exudate Color: red, brown N/A N/A Granulation Amount: Large (67-100%) N/A N/A Granulation Quality: Red N/A N/A Necrotic Amount: Small (1-33%) N/A N/A Exposed Structures: Fascia: Yes N/A N/A Fat Layer (Subcutaneous Tissue): Yes Tendon: Yes Muscle: Yes Bone: Yes Joint: No Epithelialization: None N/A N/A Treatment Notes Electronic Signature(s) Signed: 06/27/2022 3:54:37 PM By: Carlene Coria RN Entered By: Carlene Coria on 06/22/2022 13:41:04 [name] ([id number]) -------------------------------------------------------------------------------- Multi-Disciplinary Care Plan Details Patient Name: [name] Date of Service: 06/22/2022 1:15 PM Medical Record Number: [id number] Patient Account Number: [id number] Date of Birth/Sex: 1966/08/29 (56 y.o. M) Treating RN: Carlene Coria Primary Care Jerald Villalona: PATIENT, NO Other Clinician: Referring [place] Giammona: Fredirick Maudlin Treating Kerney Hopfensperger/Extender: Skipper Cliche in Treatment: 1 Active Inactive Wound/Skin Impairment Nursing Diagnoses: Knowledge deficit related to ulceration/compromised skin integrity Goals: Patient/caregiver will verbalize understanding of skin care  regimen Date Initiated: 06/15/2022 Target Resolution Date: 07/16/2022 Goal Status: Active Ulcer/skin breakdown will have a volume reduction of 30% by week 4 Date Initiated: 06/15/2022 Target Resolution Date: 07/16/2022 Goal Status: Active Ulcer/skin breakdown will have a volume reduction of 50% by week 8 Date Initiated: 06/15/2022 Target Resolution Date: 08/16/2022 Goal Status: Active Ulcer/skin breakdown will have a volume reduction of 80% by week 12 Date Initiated: 06/15/2022 Target Resolution Date: 09/15/2022 Goal Status: Active Ulcer/skin breakdown will heal within 14 weeks Date Initiated: 06/15/2022 Target Resolution Date: 10/16/2022 Goal Status: Active Interventions: Assess patient/caregiver ability to obtain necessary supplies Assess patient/caregiver ability to perform ulcer/skin care regimen upon admission and as needed Assess ulceration(s) every visit Notes: Electronic Signature(s) Signed: 06/27/2022 3:54:37 PM By: Carlene Coria RN Entered By: Carlene Coria on 06/22/2022 13:40:47 [name] ([id number]) -------------------------------------------------------------------------------- Pain Assessment Details Patient Name: [name] Date of Service: 06/22/2022 1:15 PM Medical Record Number: [id number] Patient Account Number: [id number] Date of Birth/Sex: 01-10-1966 (56 y.o. M) Treating RN: Carlene Coria Primary Care Cotina Freedman: PATIENT, NO Other Clinician: Referring Chetan Mehring: Fredirick Maudlin Treating Asim Gersten/Extender: Skipper Cliche in Treatment: 1 Active Problems Location of Pain Severity and Description of Pain Patient Has Paino Yes Site Locations With Dressing Change: Yes Duration of the Pain. Constant / Intermittento Constant Rate the pain. Current Pain Level: 5 Worst Pain Level: 9 Least Pain Level: 2 Tolerable Pain Level: 5 Character of Pain Describe the Pain: Burning, Throbbing Pain Management and Medication Current Pain Management: Medication: Yes Cold  Application: No Rest: Yes Massage: No Activity: No T.E.N.S.: No Heat Application: No Leg drop or elevation: No Is the Current Pain Management Adequate: Inadequate How does your wound impact your activities of daily livingo Sleep: Yes Bathing: No Appetite: No Relationship With Others: No Bladder Continence: No Emotions: No Bowel Continence: No Work: No Toileting: No Drive: No Dressing: No Hobbies: No Electronic Signature(s) Signed: 06/27/2022 3:54:37 PM By: Carlene Coria RN Entered By: Carlene Coria on 06/22/2022 13:29:45 [name] ([id number]) -------------------------------------------------------------------------------- Patient/Caregiver Education Details Patient Name: [name] Date of Service: 06/22/2022 1:15 PM Medical Record Number: [id number] Patient Account Number: [id number] Date of Birth/Gender: 01-13-1966 (56 y.o. M) Treating RN: Carlene Coria Primary Care Physician: PATIENT, NO Other Clinician: Referring Physician: Fredirick Maudlin Treating Physician/Extender: Skipper Cliche in Treatment: 1 Education Assessment Education Provided To: Patient Education Topics Provided Wound/Skin Impairment: Methods: Explain/Verbal Responses: State content correctly Electronic Signature(s) Signed:  06/27/2022 3:54:37 PM By: Carlene Coria RN Entered By: Carlene Coria on 06/23/2022 16:04:00 [name] ([id number]) -------------------------------------------------------------------------------- Wound Assessment Details Patient Name: [name] Date of Service: 06/22/2022 1:15 PM Medical Record Number: [id number] Patient Account Number: [id number] Date of Birth/Sex: 03/05/1966 (56 y.o. M) Treating RN: Carlene Coria Primary Care Shawn Carattini: PATIENT, NO Other Clinician: Referring Kathie Posa: Fredirick Maudlin Treating Chanler Schreiter/Extender: Skipper Cliche in Treatment: 1 Wound Status Wound Number: 1 Primary Etiology: Diabetic Wound/Ulcer of the Lower Extremity Wound Location: Left,  Plantar Foot Wound Status: Open Wounding Event: Gradually Appeared Comorbid History: Type II Diabetes Date Acquired: 12/04/2021 Weeks Of Treatment: 1 Clustered Wound: No Pending Amputation On Presentation Photos Wound Measurements Length: (cm) 14 Width: (cm) 8.5 Depth: (cm) 0.5 Area: (cm) 93.462 Volume: (cm) 46.731 % Reduction in Area: 7% % Reduction in Volume: 7% Epithelialization: None Tunneling: No Undermining: No Wound Description Classification: Grade 4 Exudate Amount: Medium Exudate Type: Serosanguineous Exudate Color: red, brown Foul Odor After Cleansing: No Slough/Fibrino Yes Wound Bed Granulation Amount: Large (67-100%) Exposed Structure Granulation Quality: Red Fascia Exposed: Yes Necrotic Amount: Small (1-33%) Fat Layer (Subcutaneous Tissue) Exposed: Yes Necrotic Quality: Adherent Slough Tendon Exposed: Yes Muscle Exposed: Yes Necrosis of Muscle: Yes Joint Exposed: No Bone Exposed: Yes Treatment Notes Wound #1 (Foot) Wound Laterality: Plantar, Left Cleanser [place] Ancillary Kit - 15 Day Supply Discharge Instruction: Use supplies as instructed; Kit contains: (15) Saline Bullets; (15) 3x3 Gauze; 15 pr Gloves Dakin 16 (oz) 0.25 Pribble, Josiah ([id number]) Discharge Instruction: Use as directed. Peri-Wound Care Topical Primary Dressing Gauze Discharge Instruction: moistened with 1/4 Dakins Solution [facility] Oil Emulsion Dressings, 3x8 (in/in) Discharge Instruction: cover exposed bone Secondary Dressing ABD Pad 5x9 (in/in) Discharge Instruction: Cover with ABD pad Kerlix 4.5 x 4.1 (in/yd) Discharge Instruction: Apply Kerlix 4.5 x 4.1 (in/yd) as instructed Secured With [place] H Soft Cloth Surgical Tape, 2x2 (in/yd) Compression Wrap Compression Stockings Add-Ons Electronic Signature(s) Signed: 06/27/2022 3:54:37 PM By: Carlene Coria RN Entered By: Carlene Coria on 06/22/2022 13:39:28 [name]  ([id number]) -------------------------------------------------------------------------------- [place] Details Patient Name: [name] Date of Service: 06/22/2022 1:15 PM Medical Record Number: [id number] Patient Account Number: [id number] Date of Birth/Sex: 1966/05/20 (56 y.o. M) Treating RN: Carlene Coria Primary Care Sully Dyment: PATIENT, NO Other Clinician: Referring Alok Minshall: Fredirick Maudlin Treating Mykelti Goldenstein/Extender: Skipper Cliche in Treatment: 1 Vital Signs Time Taken: 13:28 Temperature (F): 99.5 Height (in): 70 Pulse (bpm): 90 Weight (lbs): 230 Respiratory Rate (breaths/min): 18 Body Mass Index (BMI): 33 Blood Pressure (mmHg): 142/77 Reference Range: 80 - 120 mg / dl Electronic Signature(s) Signed: 06/27/2022 3:54:37 PM By: Carlene Coria RN Entered By: Carlene Coria on 06/22/2022 13:29:04

## 2022-06-27 NOTE — Addendum Note (Signed)
Addended by: Barnie Del R on: 06/27/2022 11:07 AM   Modules accepted: Orders

## 2022-06-27 NOTE — Telephone Encounter (Signed)
[name] states that he is having spasms in his foot. He would like to know if we can call him in some medication to help with this.  He uses [facility] [address].  Call back # is [phone].

## 2022-06-28 ENCOUNTER — Telehealth: Payer: Self-pay | Admitting: Orthopedic Surgery

## 2022-06-28 ENCOUNTER — Other Ambulatory Visit (HOSPITAL_COMMUNITY): Payer: Self-pay

## 2022-06-28 ENCOUNTER — Encounter (HOSPITAL_BASED_OUTPATIENT_CLINIC_OR_DEPARTMENT_OTHER): Payer: 59 | Admitting: Internal Medicine

## 2022-06-28 ENCOUNTER — Other Ambulatory Visit: Payer: Self-pay

## 2022-06-28 ENCOUNTER — Encounter: Payer: 59 | Admitting: Family

## 2022-06-28 DIAGNOSIS — R69 Illness, unspecified: Secondary | ICD-10-CM | POA: Diagnosis not present

## 2022-06-28 DIAGNOSIS — E11621 Type 2 diabetes mellitus with foot ulcer: Secondary | ICD-10-CM | POA: Diagnosis not present

## 2022-06-28 DIAGNOSIS — L97524 Non-pressure chronic ulcer of other part of left foot with necrosis of bone: Secondary | ICD-10-CM

## 2022-06-28 DIAGNOSIS — E1169 Type 2 diabetes mellitus with other specified complication: Secondary | ICD-10-CM | POA: Diagnosis not present

## 2022-06-28 DIAGNOSIS — M86672 Other chronic osteomyelitis, left ankle and foot: Secondary | ICD-10-CM

## 2022-06-28 NOTE — Telephone Encounter (Signed)
Patient called advised the rx for Robaxin was suppose to be to [facility] on [address]. Patient said he really need the medication.  Patient asked for a call back when Rx is sent to the correct pharmacy. The number to contact patient is [phone]

## 2022-06-28 NOTE — Telephone Encounter (Signed)
I called pt back and told him that I had resent his medication to [facility] yesterday before we left for the day.

## 2022-06-28 NOTE — Progress Notes (Signed)
SAABIR, BLYTH ([id number]) Visit Report for 06/28/2022 Arrival Information Details Patient Name: [name], [name] Date of Service: 06/28/2022 8:30 AM Medical Record Number: [id number] Patient Account Number: [id number] Date of Birth/Sex: 12-27-1965 (56 y.o. M) Treating RN: Huel Coventry Primary Care Clearnce Leja: PATIENT, NO Other Clinician: Referring Jareth Pardee: Duanne Guess Treating Harvard Zeiss/Extender: Tilda Franco in Treatment: 1 Visit Information History Since Last Visit Added or deleted any medications: No Patient Arrived: Ambulatory Has Dressing in Place as Prescribed: Yes Arrival Time: 08:25 Pain Present Now: No Accompanied By: self Transfer Assistance: None Patient Identification Verified: Yes Secondary Verification Process Completed: Yes Patient Requires Transmission-Based Precautions: No Patient Has Alerts: No Electronic Signature(s) Signed: 06/28/2022 4:29:23 PM By: Elliot Gurney, BSN, RN, CWS, Kim RN, BSN Entered By: Elliot Gurney, BSN, RN, CWS, Kim on 06/28/2022 08:29:41 [name] ([id number]) -------------------------------------------------------------------------------- Clinic Level of Care Assessment Details Patient Name: [name] Date of Service: 06/28/2022 8:30 AM Medical Record Number: [id number] Patient Account Number: [id number] Date of Birth/Sex: [birth year] (56 y.o. M) Treating RN: Huel Coventry Primary Care Gizell Danser: PATIENT, NO Other Clinician: Referring Fed Ceci: Duanne Guess Treating Martavious Hartel/Extender: Tilda Franco in Treatment: 1 Clinic Level of Care Assessment Items TOOL 4 Quantity Score []  - Use when only an EandM is performed on FOLLOW-UP visit 0 ASSESSMENTS - Nursing Assessment / Reassessment X - Reassessment of Co-morbidities (includes updates in patient status) 1 10 X- 1 5 Reassessment of Adherence to Treatment Plan ASSESSMENTS - Wound and Skin Assessment / Reassessment X - Simple Wound Assessment / Reassessment - one wound 1 5 []  - 0 Complex  Wound Assessment / Reassessment - multiple wounds []  - 0 Dermatologic / Skin Assessment (not related to wound area) ASSESSMENTS - Focused Assessment []  - Circumferential Edema Measurements - multi extremities 0 []  - 0 Nutritional Assessment / Counseling / Intervention []  - 0 Lower Extremity Assessment (monofilament, tuning fork, pulses) []  - 0 Peripheral Arterial Disease Assessment (using hand held doppler) ASSESSMENTS - Ostomy and/or Continence Assessment and Care []  - Incontinence Assessment and Management 0 []  - 0 Ostomy Care Assessment and Management (repouching, etc.) PROCESS - Coordination of Care X - Simple Patient / Family Education for ongoing care 1 15 []  - 0 Complex (extensive) Patient / Family Education for ongoing care X- 1 10 Staff obtains , Records, Test Results / Process Orders []  - 0 Staff telephones HHA, Nursing Homes / Clarify orders / etc []  - 0 Routine Transfer to another Facility (non-emergent condition) []  - 0 Routine Hospital Admission (non-emergent condition) []  - 0 New Admissions / / Ordering NPWT, Apligraf, etc. []  - 0 Emergency Hospital Admission (emergent condition) X- 1 10 Simple Discharge Coordination []  - 0 Complex (extensive) Discharge Coordination PROCESS - Special Needs []  - Pediatric / Minor Patient Management 0 []  - 0 Isolation Patient Management []  - 0 Hearing / Language / Visual special needs []  - 0 Assessment of Community assistance (transportation, D/C planning, etc.) []  - 0 Additional assistance / Altered mentation []  - 0 Support Surface(s) Assessment (bed, cushion, seat, etc.) INTERVENTIONS - Wound Cleansing / Measurement Brees, Draco ( ) X- 1 5 Simple Wound Cleansing - one wound []  - 0 Complex Wound Cleansing - multiple wounds X- 1 5 Wound Imaging (photographs - any number of wounds) []  - 0 Wound Tracing (instead of photographs) X- 1 5 Simple Wound Measurement - one wound []   - 0 Complex Wound Measurement - multiple wounds INTERVENTIONS - Wound Dressings []  - Small Wound Dressing one or multiple wounds 0 []  - 0  Medium Wound Dressing one or multiple wounds X- 1 20 Large Wound Dressing one or multiple wounds []  - 0 Application of Medications - topical []  - 0 Application of Medications - injection INTERVENTIONS - Miscellaneous []  - External ear exam 0 []  - 0 Specimen Collection (cultures, biopsies, blood, body fluids, etc.) []  - 0 Specimen(s) / Culture(s) sent or taken to Lab for analysis []  - 0 Patient Transfer (multiple staff / Harrel Lemon Lift / Similar devices) []  - 0 Simple Staple / Suture removal (25 or less) []  - 0 Complex Staple / Suture removal (26 or more) []  - 0 Hypo / Hyperglycemic Management (close monitor of Blood Glucose) []  - 0 Ankle / Brachial Index (ABI) - do not check if billed separately X- 1 5 Vital Signs Has the patient been seen at the hospital within the last three years: Yes Total Score: 95 Level Of Care: New/Established - Level 3 Electronic Signature(s) Signed: 06/28/2022 4:29:23 PM By: Gretta Cool, BSN, RN, CWS, Kim RN, BSN Entered By: Gretta Cool, BSN, RN, CWS, Kim on 06/28/2022 [place], [place] ([id number]) -------------------------------------------------------------------------------- Encounter Discharge Information Details Patient Name: [name] Date of Service: 06/28/2022 8:30 AM Medical Record Number: [id number] Patient Account Number: [id number] Date of Birth/Sex: 1966-06-13 (56 y.o. M) Treating RN: Cornell Barman Primary Care Lisandra Mathisen: PATIENT, NO Other Clinician: Referring Parry Po: Fredirick Maudlin Treating Roxsana Riding/Extender: Yaakov Guthrie in Treatment: 1 Encounter Discharge Information Items Discharge Condition: Stable Ambulatory Status: Ambulatory Discharge Destination: Home Transportation: Private Auto Accompanied By: self Schedule Follow-up Appointment: Yes Clinical Summary of Care: Electronic  Signature(s) Signed: 06/28/2022 4:29:23 PM By: Gretta Cool, BSN, RN, CWS, Kim RN, BSN Entered By: Gretta Cool, BSN, RN, CWS, Kim on 06/28/2022 09:44:05 [name] ([id number]) -------------------------------------------------------------------------------- Lower Extremity Assessment Details Patient Name: [name] Date of Service: 06/28/2022 8:30 AM Medical Record Number: [id number] Patient Account Number: [id number] Date of Birth/Sex: May 08, 1966 (56 y.o. M) Treating RN: Cornell Barman Primary Care [place] Perra: PATIENT, NO Other Clinician: Referring Aneya Daddona: Fredirick Maudlin Treating Landis Cassaro/Extender: Yaakov Guthrie in Treatment: 1 Edema Assessment Assessed: [Left: No] [Right: No] [Left: Edema] [Right: :] Calf Left: Right: Point of Measurement: 32 cm From Medial Instep 39.5 cm Ankle Left: Right: Point of Measurement: 11 cm From Medial Instep 25.5 cm Vascular Assessment Pulses: Dorsalis Pedis Palpable: [Left:Yes] Electronic Signature(s) Signed: 06/28/2022 4:29:23 PM By: Gretta Cool, BSN, RN, CWS, Kim RN, BSN Entered By: Gretta Cool, BSN, RN, CWS, Kim on 06/28/2022 08:37:56 [name] ([id number]) -------------------------------------------------------------------------------- Multi Wound Chart Details Patient Name: [name] Date of Service: 06/28/2022 8:30 AM Medical Record Number: [id number] Patient Account Number: [id number] Date of Birth/Sex: [birth year] (56 y.o. M) Treating RN: Cornell Barman Primary Care Donna Silverman: PATIENT, NO Other Clinician: Referring Joniah Bednarski: Fredirick Maudlin Treating Sequoia Mincey/Extender: Yaakov Guthrie in Treatment: 1 Vital Signs Height(in): 70 Pulse(bpm): 77 Weight(lbs): 230 Blood Pressure(mmHg): 153/83 Body Mass Index(BMI): 33 Temperature(F): 97.7 Respiratory Rate(breaths/min): 18 Photos: [N/A:N/A] Wound Location: Left, Plantar Foot N/A N/A Wounding Event: Gradually Appeared N/A N/A Primary Etiology: Diabetic Wound/Ulcer of the Lower N/A  N/A Extremity Comorbid History: Type II Diabetes N/A N/A Date Acquired: 12/04/2021 N/A N/A Weeks of Treatment: 1 N/A N/A Wound Status: Open N/A N/A Wound Recurrence: No N/A N/A Pending Amputation on Yes N/A N/A Presentation: Measurements L x W x D (cm) 13.5x8.2x0.5 N/A N/A Area (cm) : 86.944 N/A N/A Volume (cm) : 43.472 N/A N/A % Reduction in Area: 13.50% N/A N/A % Reduction in Volume: 13.50% N/A N/A Classification: Grade 4 N/A N/A Exudate Amount: Medium N/A N/A Exudate Type: Serosanguineous N/A  N/A Exudate Color: red, brown N/A N/A Granulation Amount: Large (67-100%) N/A N/A Granulation Quality: Pink, Pale, Hyper-granulation N/A N/A Necrotic Amount: Small (1-33%) N/A N/A Exposed Structures: Fascia: Yes N/A N/A Fat Layer (Subcutaneous Tissue): Yes Tendon: Yes Muscle: Yes Bone: Yes Joint: No Epithelialization: None N/A N/A Treatment Notes Electronic Signature(s) Signed: 06/28/2022 4:29:23 PM By: Elliot Gurney, BSN, RN, CWS, Kim RN, BSN Entered By: Elliot Gurney, BSN, RN, CWS, Kim on 06/28/2022 09:06:02 [name] ([id number]) -------------------------------------------------------------------------------- Multi-Disciplinary Care Plan Details Patient Name: [name] Date of Service: 06/28/2022 8:30 AM Medical Record Number: [id number] Patient Account Number: [id number] Date of Birth/Sex: [birth year] (56 y.o. M) Treating RN: Huel Coventry Primary Care Isami Mehra: PATIENT, NO Other Clinician: Referring Paityn Balsam: Duanne Guess Treating Suhail Peloquin/Extender: Tilda Franco in Treatment: 1 Active Inactive Necrotic Tissue Nursing Diagnoses: Impaired tissue integrity related to necrotic/devitalized tissue Knowledge deficit related to management of necrotic/devitalized tissue Goals: Necrotic/devitalized tissue will be minimized in the wound bed Date Initiated: 06/28/2022 Target Resolution Date: 06/28/2022 Goal Status: Active Patient/caregiver will verbalize understanding of reason and  process for debridement of necrotic tissue Date Initiated: 06/28/2022 Target Resolution Date: 06/28/2022 Goal Status: Active Interventions: Assess patient pain level pre-, during and post procedure and prior to discharge Provide education on necrotic tissue and debridement process Treatment Activities: Excisional debridement : 06/28/2022 Notes: Orientation to the Wound Care Program Nursing Diagnoses: Knowledge deficit related to the wound healing center program Goals: Patient/caregiver will verbalize understanding of the Wound Healing Center Program Date Initiated: 06/28/2022 Target Resolution Date: 06/28/2022 Goal Status: Active Interventions: Provide education on orientation to the wound center Notes: Osteomyelitis Nursing Diagnoses: Infection: osteomyelitis Knowledge deficit related to disease process and management Potential for infection: osteomyelitis Goals: Diagnostic evaluation for osteomyelitis completed as ordered Date Initiated: 06/28/2022 Target Resolution Date: 06/28/2022 Goal Status: Active Patient/caregiver will verbalize understanding of disease process and disease management Date Initiated: 06/28/2022 Target Resolution Date: 06/28/2022 Goal Status: Active Patient's osteomyelitis will resolve Date Initiated: 06/28/2022 Target Resolution Date: 06/28/2022 [name], [name] ([id number]) Goal Status: Active Signs and symptoms for osteomyelitis will be recognized and promptly addressed Date Initiated: 06/28/2022 Target Resolution Date: 06/28/2022 Goal Status: Active Interventions: Assess for signs and symptoms of osteomyelitis resolution every visit Provide education on osteomyelitis Screen for HBO Notes: Wound/Skin Impairment Nursing Diagnoses: Knowledge deficit related to ulceration/compromised skin integrity Goals: Patient/caregiver will verbalize understanding of skin care regimen Date Initiated: 06/15/2022 Target Resolution Date: 07/16/2022 Goal Status:  Active Ulcer/skin breakdown will have a volume reduction of 30% by week 4 Date Initiated: 06/15/2022 Target Resolution Date: 07/16/2022 Goal Status: Active Ulcer/skin breakdown will have a volume reduction of 50% by week 8 Date Initiated: 06/15/2022 Target Resolution Date: 08/16/2022 Goal Status: Active Ulcer/skin breakdown will have a volume reduction of 80% by week 12 Date Initiated: 06/15/2022 Target Resolution Date: 09/15/2022 Goal Status: Active Ulcer/skin breakdown will heal within 14 weeks Date Initiated: 06/15/2022 Target Resolution Date: 10/16/2022 Goal Status: Active Interventions: Assess patient/caregiver ability to obtain necessary supplies Assess patient/caregiver ability to perform ulcer/skin care regimen upon admission and as needed Assess ulceration(s) every visit Notes: Electronic Signature(s) Signed: 06/28/2022 4:29:23 PM By: Elliot Gurney, BSN, RN, CWS, Kim RN, BSN Entered By: Elliot Gurney, BSN, RN, CWS, Kim on 06/28/2022 09:05:33 [name] ([id number]) -------------------------------------------------------------------------------- Pain Assessment Details Patient Name: [name] Date of Service: 06/28/2022 8:30 AM Medical Record Number: [id number] Patient Account Number: [id number] Date of Birth/Sex: [birth year] (56 y.o. M) Treating RN: Huel Coventry Primary Care Traci Plemons: PATIENT, NO Other Clinician: Referring Gearl Baratta: Duanne Guess Treating Vinicio Lynk/Extender: Geralyn Corwin  Weeks in Treatment: 1 Active Problems Location of Pain Severity and Description of Pain Patient Has Paino Yes Site Locations Pain Location: Generalized Pain Pain Management and Medication Current Pain Management: Electronic Signature(s) Signed: 06/28/2022 4:29:23 PM By: Gretta Cool, BSN, RN, CWS, Kim RN, BSN Entered By: Gretta Cool, BSN, RN, CWS, Kim on 06/28/2022 08:32:06 [name] ([id number]) -------------------------------------------------------------------------------- Patient/Caregiver Education  Details Patient Name: [name] Date of Service: 06/28/2022 8:30 AM Medical Record Number: [id number] Patient Account Number: [id number] Date of Birth/Gender: [birth year] (56 y.o. M) Treating RN: Cornell Barman Primary Care Physician: PATIENT, NO Other Clinician: Referring Physician: Fredirick Maudlin Treating Physician/Extender: Yaakov Guthrie in Treatment: 1 Education Assessment Education Provided To: Patient Education Topics Provided Infection: Handouts: Infection Prevention and Management Methods: Demonstration, Explain/Verbal Responses: State content correctly Electronic Signature(s) Signed: 06/28/2022 4:29:23 PM By: Gretta Cool, BSN, RN, CWS, Kim RN, BSN Entered By: Gretta Cool, BSN, RN, CWS, Kim on 06/28/2022 09:43:27 [name] ([id number]) -------------------------------------------------------------------------------- Wound Assessment Details Patient Name: [name] Date of Service: 06/28/2022 8:30 AM Medical Record Number: [id number] Patient Account Number: [id number] Date of Birth/Sex: 02/02/1966 (56 y.o. M) Treating RN: Cornell Barman Primary Care Jovan Schickling: PATIENT, NO Other Clinician: Referring Rawleigh Rode: Fredirick Maudlin Treating Christna Kulick/Extender: Yaakov Guthrie in Treatment: 1 Wound Status Wound Number: 1 Primary Etiology: Diabetic Wound/Ulcer of the Lower Extremity Wound Location: Left, Plantar Foot Wound Status: Open Wounding Event: Gradually Appeared Comorbid History: Type II Diabetes Date Acquired: 12/04/2021 Weeks Of Treatment: 1 Clustered Wound: No Pending Amputation On Presentation Photos Wound Measurements Length: (cm) 13.5 Width: (cm) 8.2 Depth: (cm) 0.5 Area: (cm) 86.944 Volume: (cm) 43.472 % Reduction in Area: 13.5% % Reduction in Volume: 13.5% Epithelialization: None Tunneling: No Undermining: No Wound Description Classification: Grade 4 Fo Exudate Amount: Medium Sl Exudate Type: Serosanguineous Exudate Color: red, brown ul Odor After  Cleansing: No ough/Fibrino Yes Wound Bed Granulation Amount: Large (67-100%) Exposed Structure Granulation Quality: Pink, Pale, Hyper-granulation Fascia Exposed: Yes Necrotic Amount: Small (1-33%) Fat Layer (Subcutaneous Tissue) Exposed: Yes Necrotic Quality: Adherent Slough Tendon Exposed: Yes Muscle Exposed: Yes Necrosis of Muscle: Yes Joint Exposed: No Bone Exposed: Yes Treatment Notes Wound #1 (Foot) Wound Laterality: Plantar, Left Cleanser Dakin 16 (oz) 0.25 Discharge Instruction: Use as directed. [place], [place] ([id number]) Peri-Wound Care Topical Primary Dressing Gauze Discharge Instruction: moistened with 1/4 Dakins Solution [facility] Oil Emulsion Dressings, 3x8 (in/in) Discharge Instruction: cover exposed bone Secondary Dressing ABD Pad 5x9 (in/in) Discharge Instruction: Cover with ABD pad Kerlix 4.5 x 4.1 (in/yd) Discharge Instruction: Apply Kerlix 4.5 x 4.1 (in/yd) as instructed Secured With [place] H Soft Cloth Surgical Tape, 2x2 (in/yd) Compression Wrap Compression Stockings Environmental education officer) Signed: 06/28/2022 4:29:23 PM By: Gretta Cool, BSN, RN, CWS, Kim RN, BSN Entered By: Gretta Cool, BSN, RN, CWS, Kim on 06/28/2022 08:35:57 [name] ([id number]) -------------------------------------------------------------------------------- [place] Details Patient Name: [name] Date of Service: 06/28/2022 8:30 AM Medical Record Number: [id number] Patient Account Number: [id number] Date of Birth/Sex: 1966/02/15 (56 y.o. M) Treating RN: Cornell Barman Primary Care Aarsh Fristoe: PATIENT, NO Other Clinician: Referring Antwain Caliendo: Fredirick Maudlin Treating Zanaiya Calabria/Extender: Yaakov Guthrie in Treatment: 1 Vital Signs Time Taken: 08:29 Temperature (F): 97.7 Height (in): 70 Pulse (bpm): 77 Weight (lbs): 230 Respiratory Rate (breaths/min): 18 Body Mass Index (BMI): 33 Blood Pressure (mmHg): 153/83 Reference Range: 80 - 120 mg / dl Electronic  Signature(s) Signed: 06/28/2022 4:29:23 PM By: Gretta Cool, BSN, RN, CWS, Kim RN, BSN Entered By: Gretta Cool, BSN, RN, CWS, Kim on 06/28/2022 08:31:56

## 2022-06-28 NOTE — Progress Notes (Signed)
LAMBERTO, DINAPOLI ([id number]) Visit Report for 06/28/2022 Chief Complaint Document Details Patient Name: [name], [name] Date of Service: 06/28/2022 8:30 AM Medical Record Number: [id number] Patient Account Number: [id number] Date of Birth/Sex: 1966/10/05 (56 y.o. 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 M) Treating RN: Huel Coventry Primary Care Provider: PATIENT, NO Other Clinician: Referring Provider: Duanne Guess Treating Provider/Extender: Tilda Franco in Treatment: 1 History of Present Illness HPI Description: Notes from [facility] under Dr. America Brown care:   ADMISSION6/15/2023This is a 56 year old poorly controlled type II diabetic (last A1c 11.5%). In January of this year, he presented to the hospital with an abscess in his left foot. He also had a foreign body present. It was removed in the ER and he was admitted to the hospital for IV antibiotics. He was subsequently discharged on oral antibiotics. Due to financial concerns, he has not taken his diabetic medications (metformin and 70/30 insulin) and as a result, presented back to the emergency room on June 1 with worsening findings, including frank pus and osteomyelitis on MRI. BKA was recommended, but he declined. He was taken to the  operating room by Dr. Lajoyce Corners who performed an extensive debridement. Cultures were taken and he was placed on IV antibiotics. He saw infectious disease while in the hospital who prescribed a 6-week course of IV antibiotics including cefazolin as well as oral metronidazole and levofloxacin. The infectious disease provider also indicated that he would benefit from below-knee amputation, but the patient desired another opinion and therefore he has been referred to wound care center for further evaluation and management. ABIs obtained while he was in the hospital demonstrate adequate blood flow. Essentially the entire bottom of the patient's left foot has been removed. The heads of the majority of his metatarsal bones are exposed along with their accompanying tendons. Muscle and fat are exposed as well. The wound surface is fairly dry; the patient reports that he was not given any specific wound care instructions other than to place a dry dressing on it after showering. There is no odor or purulent drainage identified. 05/26/2022: The wound measures a little bit smaller today. There is some good granulation tissue beginning to form on the surface. He still has exposed bone and tendon. There is some slough accumulation. He saw infectious disease earlier this week and was frustrated that they recommended amputation. He is interested in another surgical opinion. He continues to smoke but says that he has cut down. He is using a knee scooter for mobility so that he can stay off of his foot. 06/05/2022: He saw Dr. Lajoyce Corners last week who was supportive of our efforts to try to salvage the foot. I referred him to vascular surgery to see Dr. Chestine Spore but he has not received an appointment yet. He continues on IV antibiotics. He says that after our conversation last week about him being unable to initiate hyperbarics if he was going to continue to smoke, he has not had a cigarette since. He is now at 4 weeks of  conventional  management and his insurance was initiated on July 1. Today, the wound continues to show evidence of improvement. There is good granulation tissue forming. The metatarsals are still exposed but actually have some pink periosteum. There is still some slough and fibrinous exudate present. No odor or purulent drainage. We have just been using Dakin's dressing changes for now. Admission to W[facility] at [facility]: 06-15-2022 upon evaluation today patient presents for initial inspection here in our clinic though he has been seen by Dr. Lady Garyannon in [place] up to this point. I did review her notes and records as well they have been using Dakin's moistened gauze dressing which has done a great job at helping to clean out the bottom of his foot. Please see the discourse above for additional information in regard to treatment course up until the patient arrives here in the [place] clinic today. The patient is ready to get started with HBO therapy as soon as possible I do think based on all my review of his records this seems to be appropriate he does have chronic osteomyelitis is also been offered amputation this is obviously a limb salvage operation here. We are trying to prevent him from having to undergo an extensive amputation which would likely be a below-knee amputation which in turn is going to affect his quality of life he wants to try to avoid that if at all possible I think hyperbaric oxygen therapy is his best bet to achieve that goal. Currently he has been on IV Ancef. That actually ends tomorrow. Following that according to notes it appears he supposed to be taking Levaquin and Flagyl for an additional period of time although I am not certain exactly how long that with the. He has been seeing regional physicians infectious disease in [place] and again that so I recommended that he contact today in order to find out what the plan is going forward. His most recent hemoglobin A1c  as noted above was 11.5 on 05-07-2022 he tells me trying to work on that he is also quit smoking as of this point. 06-22-2022 upon evaluation today patient appears to be doing well currently in regard to his wound all things considered. He is showing signs of a lot of new granulation tissue and overall very pleased. He is changing this once a day with the Dakin's moistened gauze dressing. Fortunately I do not see any evidence of infection locally or systemically at this time. We have been trying to get him into the hyperbaric chamber he was having a lot of issues with sinus pressure and we have made referral and called multiple ENT specialist the earliest we have been able to get his August 9 at [facility]. They are going to try to work him in sooner if they have any opportunities to do so. Obviously I think the sooner he can do this the better. We need to get him in hyperbarics as quickly as possible. 7/26; patient presents for follow-up. He has been using Dakin's wet-to-dry dressings and oil emollient dressing over the exposed bone. He currently denies systemic signs of infection. He states he is scheduled to see infectious disease, Dr Thedore MinsSingh next week. She had ordered an MRI of the left foot and he is scheduled to have this done tomorrow. He currently denies systemic signs of infection Including fever/chills, nausea/vomiting increased warmth or erythema to the wound bed or purulent drainage. He started hyperbaric oxygen treatment however did not tolerate this due to sinus pressure. He  is scheduled to see ENT in 2 weeks for evaluation. He is not continuing HBO until he is cleared by ENT. Electronic Signature(s) Signed: 06/28/2022 10:41:29 AM By: Geralyn Corwin DO Entered By: Geralyn Corwin on 06/28/2022 09:45:02 [name], [name] ([id number]) [name], [name] ([id number]) -------------------------------------------------------------------------------- Physical Exam Details Patient Name: [name] Date of Service: 06/28/2022 8:30 AM Medical Record Number: [id number] Patient Account Number: [id number] Date of Birth/Sex: [birth year] (56 y.o. M) Treating RN: Huel Coventry Primary Care Provider: PATIENT, NO Other Clinician: Referring Provider: Duanne Guess Treating Provider/Extender: Tilda Franco in Treatment: 1 Constitutional . Cardiovascular . Psychiatric . Notes Plantar aspect of the left foot there is a large open wound with granulation tissue. The second third and fourth metatarsal bone is exposed. No signs of surrounding soft tissue infection including increased warmth, erythema or purulent drainage. Electronic Signature(s) Signed: 06/28/2022 10:41:29 AM By: Geralyn Corwin DO Entered By: Geralyn Corwin on 06/28/2022 09:46:32 [name] ([id number]) -------------------------------------------------------------------------------- Physician Orders Details Patient Name: [name] Date of Service: 06/28/2022 8:30 AM Medical Record Number: [id number] Patient Account Number: [id number] Date of Birth/Sex: Jul 07, 1966 (56 y.o. M) Treating RN: Huel Coventry Primary Care Provider: PATIENT, NO Other Clinician: Referring Provider: Duanne Guess Treating Provider/Extender: Tilda Franco in Treatment: 1 Verbal / Phone Orders: No Diagnosis Coding Follow-up Appointments o Return Appointment in 1 week. Bathing/ Shower/ Hygiene o May shower; gently cleanse wound with antibacterial soap, rinse and pat dry prior to dressing wounds Edema Control - Lymphedema / Segmental Compressive Device / Other o Elevate, Exercise Daily and Avoid Standing for Long Periods of Time. o Elevate legs to the level of the heart and pump ankles as often as possible o Elevate leg(s) parallel to the floor when sitting. Hyperbaric Oxygen Therapy o Evaluate for HBO Therapy o Indication and location: - osteomyelitis left foot o 2.5 ATA for 90 Minutes with 2 Five (5) Minute  Air Breaks - total 40 treatments o Total # of Treatments: - 40 o Antihistamine 30 minutes prior to HBO Treatment, difficulty clearing ears. o Finger stick Blood Glucose Pre- and Post- HBOT Treatment. o Follow Hyperbaric Oxygen Glycemia Protocol Wound Treatment Wound #1 - Foot Wound Laterality: Plantar, Left Cleanser: Dakin 16 (oz) 0.25 1 x Per Day/30 Days Discharge Instructions: Use as directed. Primary Dressing: Gauze (Generic) 1 x Per Day/30 Days Discharge Instructions: moistened with 1/4 Dakins Solution Primary Dressing: [employer] Oil Emulsion Dressings, 3x8 (in/in) (Generic) 1 x Per Day/30 Days Discharge Instructions: cover exposed bone Secondary Dressing: ABD Pad 5x9 (in/in) (Generic) 1 x Per Day/30 Days Discharge Instructions: Cover with ABD pad Secondary Dressing: Kerlix 4.5 x 4.1 (in/yd) (Generic) 1 x Per Day/30 Days Discharge Instructions: Apply Kerlix 4.5 x 4.1 (in/yd) as instructed Secured With: Medipore Tape - 22M Medipore H Soft Cloth Surgical Tape, 2x2 (in/yd) (Generic) 1 x Per Day/30 Days GLYCEMIA INTERVENTIONS PROTOCOL PRE-HBO GLYCEMIA INTERVENTIONS ACTION INTERVENTION Obtain pre-HBO capillary blood glucose (ensure 1 physician order is in chart). A. Notify HBO physician and await physician orders. 2 If result is 70 mg/dl or below: B. If the result meets the hospital definition of a critical result, follow hospital policy. If result is 71 mg/dl to 301 mg/dl: A. Give patient an 8 ounce Glucerna Shake, an 8 ounce Ensure, or 8 ounces of a Glucerna/Ensure equivalent dietary supplement*. B. Wait 30 minutes. C. Retest patientos capillary blood glucose (CBG). [name], [name] ([id number]) D. If result greater than or equal to 110 mg/dl, proceed with HBO. If result less than 110 mg/dl, notify HBO  physician and consider holding HBO. If result is 131 mg/dl to 967 mg/dl: A. Proceed with HBO. A. Notify HBO physician and await physician orders. B. It is recommended  to hold HBO and do If result is 250 mg/dl or greater: blood/urine ketone testing. C. If the result meets the hospital definition of a critical result, follow hospital policy. POST-HBO GLYCEMIA INTERVENTIONS ACTION INTERVENTION Obtain post HBO capillary blood glucose (ensure 1 physician order is in chart). A. Notify HBO physician and await physician orders. 2 If result is 70 mg/dl or below: B. If the result meets the hospital definition of a critical result, follow hospital policy. A. Give patient an 8 ounce Glucerna Shake, an 8 ounce Ensure, or 8 ounces of a Glucerna/Ensure equivalent dietary supplement*. B. Wait 15 minutes for symptoms of hypoglycemia (i.e. nervousness, anxiety, If result is 71 mg/dl to 591 mg/dl: sweating, chills, clamminess, irritability, confusion, tachycardia or dizziness). C. If patient asymptomatic, discharge patient. If patient symptomatic, repeat capillary blood glucose (CBG) and notify HBO physician. If result is 101 mg/dl to 638 mg/dl: A. Discharge patient. A. Notify HBO physician and await physician orders. B. It is recommended to do blood/urine If result is 250 mg/dl or greater: ketone testing. C. If the result meets the hospital definition of a critical result, follow hospital policy. *Juice or candies are NOT equivalent products. If patient refuses the Glucerna or Ensure, please consult the hospital dietitian for an appropriate substitute. Electronic Signature(s) Signed: 06/28/2022 10:41:29 AM By: Geralyn Corwin DO Entered By: Geralyn Corwin on 06/28/2022 09:50:37 [name] ([id number]) -------------------------------------------------------------------------------- Problem List Details Patient Name: [name] Date of Service: 06/28/2022 8:30 AM Medical Record Number: [id number] Patient Account Number: [id number] Date of Birth/Sex: 1966/05/06 (56 y.o. 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 America Brown care:   ADMISSION6/15/2023This is a 56 year old poorly controlled type II diabetic (last A1c 11.5%). In January of this year, he presented to the hospital with an abscess in his left foot. He also had a foreign body present. It was removed in the ER and he was admitted to the hospital for IV antibiotics. He was subsequently discharged on oral antibiotics. Due to financial concerns, he has not taken his diabetic medications (metformin and 70/30 insulin) and as a result, presented back to the emergency room on June 1 with worsening findings, including frank pus and osteomyelitis on MRI. BKA  was recommended, but he declined. He was taken to the operating room by Dr. Lajoyce Corners who performed an extensive debridement. Cultures  were taken and he was placed on IV antibiotics. He saw infectious disease while in the hospital who prescribed a 6-week course of IV antibiotics including cefazolin as well as oral metronidazole and levofloxacin. The infectious disease provider also indicated that he would benefit from below-knee amputation, but the patient desired another opinion and therefore he has been referred to wound care center for further evaluation and management. ABIs obtained while he was in the hospital demonstrate adequate blood flow. Essentially the entire bottom of the patient's left foot has been removed. The heads of the majority of his metatarsal bones are exposed along with their accompanying tendons. Muscle and fat are exposed as well. The wound surface is fairly dry; the patient reports that he was not given any specific wound care instructions other than to place a dry dressing on it after showering. There is no odor or purulent drainage identified. 05/26/2022: The wound measures a little bit smaller today. There is some good granulation tissue beginning to form on the surface. He still has exposed bone and tendon. There is some slough accumulation. He saw infectious disease earlier this week and was frustrated that they recommended amputation. He is interested in another surgical opinion. He continues to smoke but says that he has cut down. He is using a knee scooter for mobility so that he can stay off of his foot. 06/05/2022: He saw Dr. Lajoyce Corners last week who was supportive of our efforts to try to salvage the foot. I referred him to vascular surgery to see Dr. Chestine Spore but he has not received an appointment yet. He continues on IV antibiotics. He says that after our conversation last week about him being unable to initiate hyperbarics if he was going to continue to smoke, he has not had a  cigarette since. He is now at 4 weeks of conventional management and his insurance was initiated on July 1. Today, the wound continues to show evidence of improvement. There is good granulation tissue forming. The metatarsals are still exposed but actually have some pink periosteum. There is still some slough and fibrinous exudate present. No odor or purulent drainage. We have just been using Dakin's dressing changes for now. Admission to Wound Care Center at [facility]: 06-15-2022 upon evaluation today patient presents for initial inspection here in our clinic though he has been seen by Dr. Lady Gary in [place] up to this point. I did review her notes and records as well they have been using Dakin's moistened gauze dressing which has done a great job at helping to clean out the bottom of his foot. Please see the discourse above for additional information in regard to treatment course up until the patient arrives here in the [place] clinic today. The patient is ready to get started with HBO therapy as soon as possible I do think based on all my review of his records this seems to be appropriate he does have chronic osteomyelitis is also been offered amputation this is obviously a limb salvage operation here. We are trying to prevent him from having to undergo an extensive amputation which would likely be a below-knee amputation which in turn is going to affect his quality of life he wants to try to avoid that if at all possible I think hyperbaric oxygen therapy is his best bet to achieve that goal. Currently he has been on IV Ancef. That actually ends tomorrow. Following that according to notes it appears he supposed to be taking Levaquin and Flagyl for an additional period of  time although I am not certain exactly how long that with the. He has been seeing regional physicians infectious disease in [place] and again that so I recommended that he contact today in order to find out what the plan is going  forward. His most recent hemoglobin A1c as noted above was 11.5 on 05-07-2022 he tells me trying to work on that he is also quit smoking as of this point. 06-22-2022 upon evaluation today patient appears to be doing well currently in regard to his wound all things considered. He is showing signs of a lot of new granulation tissue and overall very pleased. He is changing this once a day with the Dakin's moistened gauze dressing. Fortunately I do not see any evidence of infection locally or systemically at this time. We have been trying to get him into the hyperbaric chamber he was having a lot of issues with sinus pressure and we have made referral and called multiple ENT specialist the earliest we have been able to get his August 9 at [facility] ear nose throat. They are going to try to work him in sooner if they have any opportunities to do so. Obviously I think the sooner he can do this the better. We need to get him in hyperbarics as quickly as possible. 7/26; patient presents for follow-up. He has been using Dakin's wet-to-dry dressings and oil emollient dressing over the exposed bone. He currently denies systemic signs of infection. He states he is scheduled to see infectious disease, Dr Thedore Mins next week. She had ordered an MRI of the left foot and he is scheduled to have this done tomorrow. He currently denies systemic signs of infection Including fever/chills, nausea/vomiting increased warmth or erythema to the wound bed or purulent drainage. He started hyperbaric oxygen treatment however did not tolerate this due to sinus pressure. He is scheduled to see ENT in 2 weeks for evaluation. He is not continuing HBO until he is cleared by ENT. [name], [name] ([id number]) Objective Constitutional Vitals Time Taken: 8:29 AM, Height: 70 in, Weight: 230 lbs, BMI: 33, Temperature: 97.7 F, Pulse: 77 bpm, Respiratory Rate: 18 breaths/min, Blood Pressure: 153/83 mmHg. General Notes: Plantar aspect of the left  foot there is a large open wound with granulation tissue. The second third and fourth metatarsal bone is exposed. No signs of surrounding soft tissue infection including increased warmth, erythema or purulent drainage. Integumentary (Hair, Skin) Wound #1 status is Open. Original cause of wound was Gradually Appeared. The date acquired was: 12/04/2021. The wound has been in treatment 1 weeks. The wound is located on the Left,Plantar Foot. The wound measures 13.5cm length x 8.2cm width x 0.5cm depth; 86.944cm^2 area and 43.472cm^3 volume. There is bone, muscle, tendon, Fat Layer (Subcutaneous Tissue), and fascia exposed. There is no tunneling or undermining noted. There is a medium amount of serosanguineous drainage noted. There is large (67-100%) pink, pale, hyper - granulation within the wound bed. There is a small (1-33%) amount of necrotic tissue within the wound bed including Adherent Slough and Necrosis of Muscle. Assessment Active Problems ICD-10 Other chronic osteomyelitis, left ankle and foot Type 2 diabetes mellitus with foot ulcer Non-pressure chronic ulcer of other part of left foot with necrosis of bone Patient is an uncontrolled diabetic with chronic osteomyelitis. He has completed 6 weeks of IV antibiotics. He is obtaining an MRI of his left foot tomorrow ordered by infectious disease. Unfortunately patient is at high risk for amputation. Currently he denies signs of infection and none  noticed on exam. He knows to go to the ED if he were to experience fever/chills, nausea/vomiting, increased warmth or erythema to the wound bed or purulent drainage. He had started HBO therapy however did not tolerate this well due to sinus pressure. He is scheduled to see ear nose and throat on August 9. He will potentially resume HBO after ENT evaluation. For now I recommended continuing Dakin's wet-to-dry dressings and oil emulsion dressing over the bone. He is using his knee scooter for offloading.  Follow-up in 1 week. Plan Follow-up Appointments: Return Appointment in 1 week. Bathing/ Shower/ Hygiene: May shower; gently cleanse wound with antibacterial soap, rinse and pat dry prior to dressing wounds Edema Control - Lymphedema / Segmental Compressive Device / Other: Elevate, Exercise Daily and Avoid Standing for Long Periods of Time. Elevate legs to the level of the heart and pump ankles as often as possible Elevate leg(s) parallel to the floor when sitting. Hyperbaric Oxygen Therapy: Evaluate for HBO Therapy Indication and location: - osteomyelitis left foot 2.5 ATA for 90 Minutes with 2 Five (5) Minute Air Breaks - total 40 treatments Total # of Treatments: - 40 Antihistamine 30 minutes prior to HBO Treatment, difficulty clearing ears. Finger stick Blood Glucose Pre- and Post- HBOT Treatment. Follow Hyperbaric Oxygen Glycemia Protocol WOUND #1: - Foot Wound Laterality: Plantar, Left Cleanser: Dakin 16 (oz) 0.25 1 x Per Day/30 Days Discharge Instructions: Use as directed. Primary Dressing: Gauze (Generic) 1 x Per Day/30 Days Discharge Instructions: moistened with 1/4 Dakins Solution Primary Dressing: [employer] Oil Emulsion Dressings, 3x8 (in/in) (Generic) 1 x Per Day/30 Days Discharge Instructions: cover exposed bone Secondary Dressing: ABD Pad 5x9 (in/in) (Generic) 1 x Per Day/30 Days [name], [name] ([id number]) Discharge Instructions: Cover with ABD pad Secondary Dressing: Kerlix 4.5 x 4.1 (in/yd) (Generic) 1 x Per Day/30 Days Discharge Instructions: Apply Kerlix 4.5 x 4.1 (in/yd) as instructed Secured With: Medipore Tape - 28M Medipore H Soft Cloth Surgical Tape, 2x2 (in/yd) (Generic) 1 x Per Day/30 Days 1. Dakin's wet-to-dry dressings, will avulsion dressing over the bone 2. Aggressive offloading 3. Follow-up in 1 week Electronic Signature(s) Signed: 06/28/2022 10:41:29 AM By: Geralyn Corwin DO Entered By: Geralyn Corwin on 06/28/2022 09:50:11 [name]  ([id number]) -------------------------------------------------------------------------------- SuperBill Details Patient Name: [name] Date of Service: 06/28/2022 Medical Record Number: [id number] Patient Account Number: [id number] Date of Birth/Sex: Apr 26, 1966 (56 y.o. M) Treating RN: Huel Coventry Primary Care Provider: PATIENT, NO Other Clinician: Referring Provider: Duanne Guess Treating Provider/Extender: Tilda Franco in Treatment: 1 Diagnosis Coding ICD-10 Codes Code Description [phone] Other chronic osteomyelitis, left ankle and foot E11.621 Type 2 diabetes mellitus with foot ulcer L97.524 Non-pressure chronic ulcer of other part of left foot with necrosis of bone Facility Procedures CPT4 Code: [id number] Description: 99213 - WOUND CARE VISIT-LEV 3 EST PT Modifier: Quantity: 1 Physician Procedures CPT4 Code: 4128786 Description: 99213 - WC PHYS LEVEL 3 - EST PT Modifier: Quantity: 1 CPT4 Code: Description: ICD-10 Diagnosis Description M86.672 Other chronic osteomyelitis, left ankle and foot E11.621 Type 2 diabetes mellitus with foot ulcer L97.524 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

## 2022-06-29 ENCOUNTER — Ambulatory Visit (HOSPITAL_BASED_OUTPATIENT_CLINIC_OR_DEPARTMENT_OTHER)
Admission: RE | Admit: 2022-06-29 | Discharge: 2022-06-29 | Disposition: A | Discharge status: Home / Self Care | Payer: 59 | Source: Ambulatory Visit | Attending: Internal Medicine | Admitting: Internal Medicine

## 2022-06-29 ENCOUNTER — Other Ambulatory Visit (HOSPITAL_COMMUNITY): Payer: Self-pay

## 2022-06-29 ENCOUNTER — Other Ambulatory Visit: Payer: Self-pay

## 2022-06-29 ENCOUNTER — Ambulatory Visit: Payer: 59 | Admitting: Internal Medicine

## 2022-06-29 DIAGNOSIS — L03116 Cellulitis of left lower limb: Secondary | ICD-10-CM | POA: Insufficient documentation

## 2022-06-29 DIAGNOSIS — M609 Myositis, unspecified: Secondary | ICD-10-CM | POA: Insufficient documentation

## 2022-06-29 DIAGNOSIS — M009 Pyogenic arthritis, unspecified: Secondary | ICD-10-CM | POA: Diagnosis not present

## 2022-06-29 DIAGNOSIS — M86172 Other acute osteomyelitis, left ankle and foot: Secondary | ICD-10-CM | POA: Diagnosis not present

## 2022-06-29 MED ORDER — GADOBUTROL 1 MMOL/ML IV SOLN
10.0000 mL | Freq: Once | INTRAVENOUS | Status: AC | PRN
  Administered 2022-06-29: 10 mL via INTRAVENOUS
  Filled 2022-06-29: qty 10

## 2022-06-30 ENCOUNTER — Encounter: Payer: Self-pay | Admitting: Orthopedic Surgery

## 2022-06-30 NOTE — Progress Notes (Signed)
Office Visit Note   Patient: [name]           Date of Birth: 1966/06/05           MRN: [id number] Visit Date: 06/22/2022              Requested by: No referring provider defined for this encounter. PCP: Patient, No Pcp Per  Chief Complaint  Patient presents with   Left Foot - Routine Post Op    05/05/22 left foot deb      HPI: Patient is a 56 year old gentleman who is status post debridement of a massive abscess ulceration plantar aspect left foot.  Patient has had exposed metatarsals and metatarsal heads across the plantar aspect of his foot he has completed his IV antibiotics with a PICC line.  Currently going to the wound center with Silvadene dressing changes.  Patient states he has started hyperbaric oxygen treatment for 3 sessions states he is scheduled for 2 months daily therapy.  Patient states he needs pain medicine.  Assessment & Plan: Visit Diagnoses:  1. Diabetic ulcer of left midfoot associated with type 2 diabetes mellitus, with necrosis of bone (HCC)     Plan: Discussed that he should call infectious disease if there is to be continued antibiotics.  Patient states he has an MRI scan on Sunday per infectious disease.  A prescription was provided for Percocet.  Follow-Up Instructions: Return in about 1 week (around 06/29/2022).   Ortho Exam  Patient is alert, oriented, no adenopathy, well-dressed, normal affect, normal respiratory effort. Examination patient has increased drainage and pain on the plantar aspect of his foot all the metatarsals are visible.  Patient has uncontrolled type 2 diabetes with his last hemoglobin A1c 11.5.  Imaging: No results found. No images are attached to the encounter.  Labs: Lab Results  Component Value Date   HGBA1C 11.5 (H) 05/07/2022   HGBA1C 12.0 (H) 12/14/2021   ESRSEDRATE 34 (H) 06/20/2022   ESRSEDRATE 107 (H) 05/06/2022   ESRSEDRATE 65 (H) 05/04/2022   CRP 6.8 06/20/2022   CRP 26.4 (H) 05/06/2022   CRP 27.3 (H)  05/04/2022   REPTSTATUS 05/10/2022 FINAL 05/05/2022   GRAMSTAIN  05/05/2022    RARE WBC PRESENT,BOTH PMN AND MONONUCLEAR ABUNDANT GRAM POSITIVE COCCI ABUNDANT GRAM NEGATIVE RODS    CULT  05/05/2022    FEW STREPTOCOCCUS GALLOLYTICUS FEW STAPHYLOCOCCUS AUREUS FEW STREPTOCOCCUS GORDONII FEW PREVOTELLA SPECIES BETA LACTAMASE POSITIVE Performed at [facility], [address] [place], [place] [address]    [facility] STAPHYLOCOCCUS AUREUS 05/05/2022   LABORGA STREPTOCOCCUS GORDONII 05/05/2022   LABORGA STREPTOCOCCUS GALLOLYTICUS 05/05/2022     Lab Results  Component Value Date   ALBUMIN 2.2 (L) 05/09/2022   ALBUMIN 2.1 (L) 05/08/2022   ALBUMIN 2.7 (L) 05/07/2022    Lab Results  Component Value Date   MG 1.9 05/09/2022   MG 1.9 05/08/2022   MG 2.3 05/07/2022   No results found for: "VD25OH"  No results found for: "PREALBUMIN"    Latest Ref Rng & Units 06/20/2022   10:47 AM 05/28/2022    4:43 PM 05/11/2022    4:46 AM  CBC EXTENDED  WBC 3.8 - 10.8 Thousand/uL 8.0  6.8  9.2   RBC 4.20 - 5.80 Million/uL 4.25  4.09  3.82   Hemoglobin 13.2 - 17.1 g/dL 39.7  67.3  41.9   HCT 38.5 - 50.0 % 35.4  34.7  32.1   Platelets 140 - 400 Thousand/uL 298  306  412   NEUT# 1,500 - 7,800 cells/uL 4,704  3.9  5.8   Lymph# 850 - 3,900 cells/uL 2,248  2.0  2.1      There is no height or weight on file to calculate BMI.  Orders:  No orders of the defined types were placed in this encounter.  Meds ordered this encounter  Medications   oxyCODONE (OXY IR/ROXICODONE) 5 MG immediate release tablet    Sig: Take 1 tablet (5 mg total) by mouth every 4 (four) hours as needed for moderate pain (pain score 4-6).    Dispense:  20 tablet    Refill:  0     Procedures: No procedures performed  Clinical Data: No additional findings.  ROS:  All other systems negative, except as noted in the HPI. Review of Systems  Objective: Vital Signs: There were no vitals taken for this  visit.  Specialty Comments:  No specialty comments available.  PMFS History: Patient Active Problem List   Diagnosis Date Noted   Pyogenic inflammation of bone (HCC) 06/02/2022   PICC (peripherally inserted central catheter) in place 06/02/2022   Medication management 06/02/2022   Diabetic foot infection (HCC) 06/02/2022   Smoking 06/02/2022   Tobacco abuse 05/06/2022   Hyperkalemia 05/06/2022   Type 2 diabetes mellitus (HCC) 05/05/2022   Obesity (BMI 30-39.9) 05/05/2022   Cellulitis and abscess of foot    AKI (acute kidney injury) (HCC) 05/04/2022   Injury of left foot    Diabetic foot ulcer (HCC) 12/14/2021   Past Medical History:  Diagnosis Date   Diabetes mellitus without complication (HCC)     History reviewed. No pertinent family history.  Past Surgical History:  Procedure Laterality Date   I & D EXTREMITY Left 05/05/2022   Procedure: LEFT FOOT DEBRIDEMENT;  Surgeon: Nadara Mustard, MD;  Location: [facility] OR;  Service: Orthopedics;  Laterality: Left;   TONSILLECTOMY     Social History   Occupational History   Not on file  Tobacco Use   Smoking status: Every Day    Packs/day: 0.25    Types: Cigarettes   Smokeless tobacco: Never   Tobacco comments:    Cutting back - goes through a pack in 4-6 days  Vaping Use   Vaping Use: Never used  Substance and Sexual Activity   Alcohol use: No   Drug use: No   Sexual activity: Yes

## 2022-07-03 ENCOUNTER — Telehealth: Payer: Self-pay | Admitting: Internal Medicine

## 2022-07-03 ENCOUNTER — Encounter: Payer: Self-pay | Admitting: Internal Medicine

## 2022-07-03 MED ORDER — METRONIDAZOLE 500 MG/100ML IV SOLN: 500.0000 mg | Freq: Two times a day (BID) | INTRAVENOUS | 0 refills | Status: DC

## 2022-07-03 MED ORDER — LEVOFLOXACIN 750 MG PO TABS: 750.0000 mg | ORAL_TABLET | Freq: Every day | ORAL | 0 refills | Status: DC

## 2022-07-03 MED ORDER — CEFADROXIL 500 MG PO CAPS: 500.0000 mg | ORAL_CAPSULE | Freq: Two times a day (BID) | ORAL | 0 refills | Status: DC

## 2022-07-03 NOTE — Progress Notes (Deleted)
Calle pt in regards to MRI result. Noted cellulitis/septic arthritis and osteomyelitis despite 6 weeks of antibiotics. Relayed that he will need surgical intervention for source control. Will Rx cefadroxil, levaquin and metronidazole in case pt does not go to EX. He plans on going to ED for further evaluation. If pt is admitted please start cefazolin + levaquin+metronidazole.

## 2022-07-03 NOTE — Telephone Encounter (Signed)
Calle pt in regards to MRI result. Noted cellulitis/septic arthritis and osteomyelitis despite 6 weeks of antibiotics. Relayed that he will need surgical intervention for source control. Will Rx cefadroxil, levaquin and metronidazole in case pt does not go to EX. He plans on going to ED for further evaluation. If pt is admitted please start cefazolin + levaquin+metronidazole. 

## 2022-07-03 NOTE — Progress Notes (Deleted)
Calle pt in regards to MRI result. Noted cellulitis/septic arthritis and osteomyelitis despite 6 weeks of antibiotics. Relayed that he will need surgical intervention for source control. Will Rx cefadroxil, levaquin and metronidazole in case pt does not go to EX. He plans on going to ED for further evaluation. If pt is admitted please start cefazolin + levaquin+metronidazole. 

## 2022-07-04 ENCOUNTER — Telehealth: Payer: Self-pay | Admitting: Orthopedic Surgery

## 2022-07-04 ENCOUNTER — Ambulatory Visit (INDEPENDENT_AMBULATORY_CARE_PROVIDER_SITE_OTHER): Payer: 59 | Admitting: Vascular Surgery

## 2022-07-04 ENCOUNTER — Encounter: Payer: Self-pay | Admitting: Vascular Surgery

## 2022-07-04 ENCOUNTER — Other Ambulatory Visit: Payer: Self-pay

## 2022-07-04 ENCOUNTER — Ambulatory Visit (HOSPITAL_COMMUNITY)
Admission: RE | Admit: 2022-07-04 | Discharge: 2022-07-04 | Disposition: A | Discharge status: Home / Self Care | Payer: 59 | Source: Ambulatory Visit | Attending: Vascular Surgery | Admitting: Vascular Surgery

## 2022-07-04 VITALS — BP 123/71 | HR 93 | Temp 98.2°F | Resp 18 | Ht 69.0 in | Wt 230.0 lb

## 2022-07-04 DIAGNOSIS — E11621 Type 2 diabetes mellitus with foot ulcer: Secondary | ICD-10-CM | POA: Diagnosis not present

## 2022-07-04 DIAGNOSIS — M25569 Pain in unspecified knee: Secondary | ICD-10-CM

## 2022-07-04 DIAGNOSIS — L97424 Non-pressure chronic ulcer of left heel and midfoot with necrosis of bone: Secondary | ICD-10-CM

## 2022-07-04 MED ORDER — METRONIDAZOLE 500 MG PO TABS
500.0000 mg | ORAL_TABLET | Freq: Two times a day (BID) | ORAL | 1 refills | Status: DC
  Filled 2022-07-04: qty 60, 30d supply, fill #0
  Filled 2022-08-16: qty 60, 30d supply, fill #1

## 2022-07-04 MED ORDER — CEFADROXIL 500 MG PO CAPS
ORAL_CAPSULE | ORAL | 0 refills | Status: DC
  Filled 2022-07-04: qty 60, 30d supply, fill #0

## 2022-07-04 MED ORDER — LEVOFLOXACIN 750 MG PO TABS
ORAL_TABLET | ORAL | 0 refills | Status: DC
  Filled 2022-07-04: qty 30, 30d supply, fill #0

## 2022-07-04 MED ORDER — METRONIDAZOLE 500 MG/100ML IV SOLN
INTRAVENOUS | 0 refills | Status: DC
  Filled 2022-07-04: qty 60, 30d supply, fill #0

## 2022-07-04 NOTE — Telephone Encounter (Signed)
Patient returned call to follow up on medication cefadroxil, levaquin, and metronidazole. All rx re-faxed to pharmacy as they did not receive them. Patient asked about recommendation for ED visit. Patient states he saw his vascular surgeon today and will take  medication sent to pharmacy and follow up with [facility] as scheduled.  Routing to provider to review recent Vascular visit and make aware. Valarie Cones

## 2022-07-04 NOTE — Progress Notes (Signed)
IV Metronidazole discontinued and order updated per Dr. Thedore Mins order   Metronidazole 500 mg take one tab by mouth twice daily.

## 2022-07-04 NOTE — Telephone Encounter (Signed)
Received call from patient. He would like a 2nd opinion and requests records to Dr. Dahlia Byes at [facility]. I took verbal auth. Records faxed [phone] & [phone].

## 2022-07-04 NOTE — Progress Notes (Signed)
Patient name: [name] MRN: [id number] DOB: [birth year] Sex: male  REASON FOR CONSULT: Left lower extremity threatened limb  HPI: [name] is a 56 y.o. male, with history of diabetes that presents for evaluation of left lower extremity threatened limb.  Patient is referred by Dr. Lady Gary in the wound clinic.Marland Kitchen  He has a left diabetic foot infection that started in January of this year.  He has previously undergone left foot debridement for an abscess on 05/05/2022 with Dr. Lajoyce Corners with orthopedic surgery.  He did get 6 weeks of IV antibiotics and they were completed the beginning of July.  He feels the foot is healing.  He is followed by Dr. Thedore Mins with ID as well.  He denies any previous lower extremity revascularizations.  He does smoke.  States his diabetes has been better controlled recently.  He did get an MRI of his foot late last week.  Past Medical History:  Diagnosis Date   Diabetes mellitus without complication [facility])     Past Surgical History:  Procedure Laterality Date   I & D EXTREMITY Left 05/05/2022   Procedure: LEFT FOOT DEBRIDEMENT;  Surgeon: Nadara Mustard, MD;  Location: [facility] OR;  Service: Orthopedics;  Laterality: Left;   TONSILLECTOMY      History reviewed. No pertinent family history.  SOCIAL HISTORY: Social History   Socioeconomic History   Marital status: Single    Spouse name: Not on file   Number of children: Not on file   Years of education: Not on file   Highest education level: Not on file  Occupational History   Not on file  Tobacco Use   Smoking status: Every Day    Packs/day: 0.25    Types: Cigarettes   Smokeless tobacco: Never   Tobacco comments:    Cutting back - goes through a pack in 4-6 days  Vaping Use   Vaping Use: Never used  Substance and Sexual Activity   Alcohol use: No   Drug use: No   Sexual activity: Yes  Other Topics Concern   Not on file  Social History Narrative   Not on file   Social Determinants of Health   Financial  Resource Strain: Not on file  Food Insecurity: Not on file  Transportation Needs: Not on file  Physical Activity: Not on file  Stress: Not on file  Social Connections: Not on file  Intimate Partner Violence: Not on file    No Known Allergies  Current Outpatient Medications  Medication Sig Dispense Refill   acetaminophen (TYLENOL) 500 MG tablet Take 2,000 mg by mouth every 6 (six) hours as needed for mild pain or moderate pain.     atorvastatin (LIPITOR) 20 MG tablet Take 1 tablet (20 mg total) by mouth daily. 90 tablet 1   Continuous Blood Gluc Receiver (DEXCOM G6 RECEIVER) DEVI Use to check blood sugar three times daily. E11.69 1 each 0   Continuous Blood Gluc Sensor (DEXCOM G6 SENSOR) MISC Use to check blood sugar three times daily. Change sensor once every 10 days. E11.69 3 each 2   Continuous Blood Gluc Transmit (DEXCOM G6 TRANSMITTER) MISC Use to check blood sugar three times daily. Change transmitter once every 909 days. E11.69 1 each 1   Insulin NPH, Human,, Isophane, (HUMULIN N KWIKPEN) 100 UNIT/ML Kiwkpen Inject 20 Units into the skin 2 (two) times daily. 30 mL 6   Insulin Pen Needle 32G X 4 MM MISC Use as directed to inject insulin up to  4 times daily. 100 each 0   Insulin Pen Needle 32G X 4 MM MISC Use to inject insulin up to 4 times daily as directed. 100 each 0   metFORMIN (GLUCOPHAGE) 500 MG tablet Take 1 tablet (500 mg total) by mouth 2 (two) times daily. 180 tablet 1   methocarbamol (ROBAXIN) 500 MG tablet Take 1 tablet (500 mg total) by mouth every 6 (six) hours as needed for muscle spasms. 30 tablet 0   metroNIDAZOLE (FLAGYL) 500 MG/100ML Inject 100 mLs (500 mg total) into the vein every 12 (twelve) hours. 60 mL 0   oxyCODONE (OXY IR/ROXICODONE) 5 MG immediate release tablet Take 1 tablet (5 mg total) by mouth every 4 (four) hours as needed for moderate pain (pain score 4-6). 20 tablet 0   Semaglutide,0.25 or 0.5MG /DOS, (OZEMPIC, 0.25 OR 0.5 MG/DOSE,) 2 MG/1.5ML SOPN Inject  0.25 mg into the skin once a week. 2 mL 6   Sodium Chloride Flush (SALINE FLUSH) 0.9 % SOLN Inject 10 mLs into the vein in the morning, at noon, and at bedtime. 100 mL 0   temazepam (RESTORIL) 7.5 MG capsule Take 1 capsule (7.5 mg total) by mouth at bedtime as needed for sleep. 30 capsule 0   cefadroxil (DURICEF) 500 MG capsule Take 1 capsule (500 mg total) by mouth 2 (two) times daily. (Patient not taking: Reported on 07/04/2022) 60 capsule 0   levofloxacin (LEVAQUIN) 750 MG tablet Take 1 tablet (750 mg total) by mouth daily. (Patient not taking: Reported on 07/04/2022) 30 tablet 0   No current facility-administered medications for this visit.    REVIEW OF SYSTEMS:  [X]  denotes positive finding, [ ]  denotes negative finding Cardiac  Comments:  Chest pain or chest pressure:    Shortness of breath upon exertion:    Short of breath when lying flat:    Irregular heart rhythm:        Vascular    Pain in calf, thigh, or hip brought on by ambulation:    Pain in feet at night that wakes you up from your sleep:     Blood clot in your veins:    Leg swelling:         Pulmonary    Oxygen at home:    Productive cough:     Wheezing:         Neurologic    Sudden weakness in arms or legs:     Sudden numbness in arms or legs:     Sudden onset of difficulty speaking or slurred speech:    Temporary loss of vision in one eye:     Problems with dizziness:         Gastrointestinal    Blood in stool:     Vomited blood:         Genitourinary    Burning when urinating:     Blood in urine:        Psychiatric    Major depression:         Hematologic    Bleeding problems:    Problems with blood clotting too easily:        Skin    Rashes or ulcers:        Constitutional    Fever or chills:      PHYSICAL EXAM: Vitals:   07/04/22 0830  BP: 123/71  Pulse: 93  Resp: 18  Temp: 98.2 F (36.8 C)  TempSrc: Temporal  SpO2: 98%  Weight: 230 lb (104.3 kg)  Height: 5\' 9"  (1.753 m)     GENERAL: The patient is a well-nourished male, in no acute distress. The vital signs are documented above. CARDIAC: There is a regular rate and rhythm.  VASCULAR:  Palpable femoral pulses bilaterally Palpable DP PT pulses bilaterally Left wound on plantar foot as shown with granulation tissue and no obvious necrotic tissue PULMONARY: No respiratory distress. ABDOMEN: Soft and non-tender. MUSCULOSKELETAL: There are no major deformities or cyanosis. NEUROLOGIC: No focal weakness or paresthesias are detected. PSYCHIATRIC: The patient has a normal affect.     DATA:   Indications: Ulceration. Patient reports left foot ulceration present  since               January 2023.   High Risk Factors: Hypertension, Diabetes, current smoker.     Performing Technologist: February 2023 RVS, RCS      Examination Guidelines: A complete evaluation includes at minimum, Doppler  waveform signals and systolic blood pressure reading at the level of  bilateral  brachial, anterior tibial, and posterior tibial arteries, when vessel  segments  are accessible. Bilateral testing is considered an integral part of a  complete  examination. Photoelectric Plethysmograph (PPG) waveforms and toe systolic  pressure readings are included as required and additional duplex testing  as  needed. Limited examinations for reoccurring indications may be performed  as  noted.      ABI Findings:  +---------+------------------+-----+---------+--------+  Right    Rt Pressure (mmHg)IndexWaveform Comment   +---------+------------------+-----+---------+--------+  Brachial 143                                       +---------+------------------+-----+---------+--------+  PTA      181               1.27 triphasic          +---------+------------------+-----+---------+--------+  DP       162               1.13 triphasic          +---------+------------------+-----+---------+--------+   Great Toe156               1.09 Normal             +---------+------------------+-----+---------+--------+   +---------+------------------+-----+---------+-------+  Left     Lt Pressure (mmHg)IndexWaveform Comment  +---------+------------------+-----+---------+-------+  Brachial 143                                      +---------+------------------+-----+---------+-------+  PTA      165               1.15 triphasic         +---------+------------------+-----+---------+-------+  DP       160               1.12 triphasic         +---------+------------------+-----+---------+-------+  Great Toe                                bandage  +---------+------------------+-----+---------+-------+   +-------+-----------+-----------+------------+------------+  ABI/TBIToday's ABIToday's TBIPrevious ABIPrevious TBI  +-------+-----------+-----------+------------+------------+  Right  1.27       1.09       1.18        1.10          +-------+-----------+-----------+------------+------------+  Left   1.15       bandage    1.09        0.97          +-------+-----------+-----------+------------+------------+       Bilateral ABIs appear essentially unchanged compared to prior study on  12/15/2021.     Summary:  Right: Resting right ankle-brachial index is within normal range. No  evidence of significant right lower extremity arterial disease. The right  toe-brachial index is normal.   Left: Resting left ankle-brachial index is within normal range. No  evidence of significant left lower extremity arterial disease.   Assessment/Plan:  56 year old male that presents for evaluation of a diabetic wound on the bottom of his left foot as pictured above.  I discussed that he has easily palpable dorsalis pedis and posterior tibial pulses in the left foot with normal ABIs and triphasic waveforms at the ankle.  No signs of significant arterial disease.  He  should have more than adequate inflow for wound healing.  He obviously wants to make every attempt at limb salvage.  Discussed this is often a multi team approach.  I discussed that he follow-up with Dr. Thedore Mins with ID again to discuss if another course of antibiotics would be indicated given his MRI findings of ongoing osteomyelitis.  The wound itself actually looks great and I know he is doing hyperbarics with the assistance of Dr. Lady Gary.  There is a good bed a granulation tissue.  I also suggest that he follow-up with Dr. Lajoyce Corners on Monday for any further input as he has been his primary surgeon.  I am happy to see him again in another month and will follow his wound and progress.   Matthew Shelling, MD [facility] Office: [phone]

## 2022-07-05 ENCOUNTER — Other Ambulatory Visit: Payer: Self-pay

## 2022-07-06 ENCOUNTER — Ambulatory Visit: Payer: 59 | Attending: Physician Assistant | Admitting: Physician Assistant

## 2022-07-06 ENCOUNTER — Other Ambulatory Visit: Payer: Self-pay

## 2022-07-06 ENCOUNTER — Encounter: Payer: Self-pay | Admitting: Physician Assistant

## 2022-07-06 ENCOUNTER — Encounter: Payer: 59 | Attending: Physician Assistant | Admitting: Physician Assistant

## 2022-07-06 VITALS — BP 145/87 | HR 88 | Ht 70.0 in | Wt 235.0 lb

## 2022-07-06 DIAGNOSIS — E11628 Type 2 diabetes mellitus with other skin complications: Secondary | ICD-10-CM

## 2022-07-06 DIAGNOSIS — Z9189 Other specified personal risk factors, not elsewhere classified: Secondary | ICD-10-CM | POA: Diagnosis not present

## 2022-07-06 DIAGNOSIS — M86672 Other chronic osteomyelitis, left ankle and foot: Secondary | ICD-10-CM | POA: Diagnosis present

## 2022-07-06 DIAGNOSIS — L97524 Non-pressure chronic ulcer of other part of left foot with necrosis of bone: Secondary | ICD-10-CM | POA: Insufficient documentation

## 2022-07-06 DIAGNOSIS — E1169 Type 2 diabetes mellitus with other specified complication: Secondary | ICD-10-CM

## 2022-07-06 DIAGNOSIS — E11621 Type 2 diabetes mellitus with foot ulcer: Secondary | ICD-10-CM | POA: Insufficient documentation

## 2022-07-06 DIAGNOSIS — L089 Local infection of the skin and subcutaneous tissue, unspecified: Secondary | ICD-10-CM | POA: Diagnosis not present

## 2022-07-06 DIAGNOSIS — Z794 Long term (current) use of insulin: Secondary | ICD-10-CM

## 2022-07-06 LAB — GLUCOSE, POCT (MANUAL RESULT ENTRY): POC Glucose: 155 mg/dl — AB (ref 70–99)

## 2022-07-06 MED ORDER — HUMULIN N KWIKPEN 100 UNIT/ML ~~LOC~~ SUPN
20.0000 [IU] | PEN_INJECTOR | Freq: Two times a day (BID) | SUBCUTANEOUS | 6 refills | Status: DC
  Filled 2022-07-06: qty 12, 30d supply, fill #0

## 2022-07-06 MED ORDER — ATORVASTATIN CALCIUM 20 MG PO TABS
20.0000 mg | ORAL_TABLET | Freq: Every day | ORAL | 1 refills | Status: DC
  Filled 2022-07-06: qty 30, 30d supply, fill #0

## 2022-07-06 MED ORDER — INSULIN PEN NEEDLE 32G X 4 MM MISC
0 refills | Status: DC
  Filled 2022-07-06: qty 100, 25d supply, fill #0

## 2022-07-06 MED ORDER — OZEMPIC (0.25 OR 0.5 MG/DOSE) 2 MG/1.5ML ~~LOC~~ SOPN
0.2500 mg | PEN_INJECTOR | SUBCUTANEOUS | 6 refills | Status: DC
  Filled 2022-07-06: qty 1.5, 56d supply, fill #0

## 2022-07-06 NOTE — Progress Notes (Signed)
Patient ID: [name], male   DOB: 1966-05-18, 56 y.o.   MRN: [id number]   [name], is a 56 y.o. male  TIR:[id number]  [id number]:[id number]  DOB - 1966/10/25  Chief Complaint  Patient presents with   Medication Refill   Diabetes       Subjective:   [name] is a 56 y.o. male here today for cholesterol and insulin RF.  He is not checking blood sugars.  He is still on oral antibiotics for wound on L foot-just came from center-freshly wrapped and says they are pleased with his progress.  Compliant with meds.    No problems updated.  ALLERGIES: No Known Allergies  PAST MEDICAL HISTORY: Past Medical History:  Diagnosis Date   Diabetes mellitus without complication (HCC)     MEDICATIONS AT HOME: Prior to Admission medications   Medication Sig Start Date End Date Taking? Authorizing Provider  acetaminophen (TYLENOL) 500 MG tablet Take 2,000 mg by mouth every 6 (six) hours as needed for mild pain or moderate pain.   Yes [provider]  oxyCODONE (OXY IR/ROXICODONE) 5 MG immediate release tablet Take 1 tablet (5 mg total) by mouth every 4 (four) hours as needed for moderate pain (pain score 4-6). 06/22/22  Yes Nadara Mustard, MD  atorvastatin (LIPITOR) 20 MG tablet Take 1 tablet (20 mg total) by mouth daily. 07/06/22   Anders Simmonds, PA-C  cefadroxil (DURICEF) 500 MG capsule Take 1 capsule (500 mg total) by mouth 2 (two) times daily. Patient not taking: Reported on 07/04/2022 07/03/22   Danelle Earthly, MD  cefadroxil (DURICEF) 500 MG capsule take 1 capsule by mouth twice daily 07/03/22   Danelle Earthly, MD  Continuous Blood Gluc Receiver (DEXCOM G6 RECEIVER) DEVI Use to check blood sugar three times daily. E11.69 Patient not taking: Reported on 07/06/2022 06/05/22   [name], MD  Continuous Blood Gluc Sensor (DEXCOM G6 SENSOR) MISC Use to check blood sugar three times daily. Change sensor once every 10 days. E11.69 Patient not taking: Reported on 07/06/2022 06/05/22   [name], MD  Continuous Blood Gluc Transmit (DEXCOM G6 TRANSMITTER) MISC Use to check blood sugar three times daily. Change transmitter once every 909 days. E11.69 Patient not taking: Reported on 07/06/2022 06/05/22   [name], MD  Insulin NPH, Human,, Isophane, (HUMULIN N KWIKPEN) 100 UNIT/ML Kiwkpen Inject 20 Units into the skin 2 (two) times daily. 07/06/22   Anders Simmonds, PA-C  Insulin Pen Needle 32G X 4 MM MISC Use to inject insulin up to 4 times daily as directed. Patient not taking: Reported on 07/06/2022 06/05/22   [name], MD  Insulin Pen Needle 32G X 4 MM MISC Use as directed to inject insulin up to 4 times daily. 07/06/22   Anders Simmonds, PA-C  levofloxacin (LEVAQUIN) 750 MG tablet Take 1 tablet (750 mg total) by mouth daily. Patient not taking: Reported on 07/04/2022 07/03/22 08/02/22  Danelle Earthly, MD  levofloxacin [facility]) 750 MG tablet take 1 tab by mouth daily 07/03/22   Danelle Earthly, MD  metFORMIN (GLUCOPHAGE) 500 MG tablet Take 1 tablet (500 mg total) by mouth 2 (two) times daily. Patient not taking: Reported on 07/06/2022 06/05/22   [name], MD  methocarbamol (ROBAXIN) 500 MG tablet Take 1 tablet (500 mg total) by mouth every 6 (six) hours as needed for muscle spasms. 06/27/22   Adonis Huguenin, NP  metroNIDAZOLE (FLAGYL) 500 MG tablet Take 1 tablet (500 mg total) by mouth 2 (  two) times daily. 07/04/22   Danelle Earthly, MD  Semaglutide,0.25 or 0.5MG /DOS, (OZEMPIC, 0.25 OR 0.5 MG/DOSE,) 2 MG/1.5ML SOPN Inject 0.25 mg into the skin once a week. 07/06/22   Anders Simmonds, PA-C  Sodium Chloride Flush (SALINE FLUSH) 0.9 % SOLN Inject 10 mLs into the vein in the morning, at noon, and at bedtime. Patient not taking: Reported on 07/06/2022 05/13/22   Huel Cote, MD  temazepam (RESTORIL) 7.5 MG capsule Take 1 capsule (7.5 mg total) by mouth at bedtime as needed for sleep. Patient not taking: Reported on 07/06/2022 05/18/22   Nadara Mustard, MD    ROS: Neg HEENT Neg  resp Neg cardiac Neg GI Neg GU Neg psych Neg neuro  Objective:   Vitals:   07/06/22 1423  BP: (!) 145/87  Pulse: 88  SpO2: 99%  Weight: 235 lb (106.6 kg)  Height: 5\' 10"  (1.778 m)   Exam General appearance : Awake, alert, not in any distress. Speech Clear. Not toxic looking HEENT: Atraumatic and Normocephalic Neck: Supple, no JVD. No cervical lymphadenopathy.  Chest: Good air entry bilaterally, CTAB.  No rales/rhonchi/wheezing CVS: S1 S2 regular, no murmurs.  Extremities: B/L Lower Ext shows mild  edema L foot, pulses good B both legs are warm to touch Neurology: Awake alert, and oriented X 3, CN II-XII intact, Non focal Skin: No Rash  Data Review Lab Results  Component Value Date   HGBA1C 11.5 (H) 05/07/2022   HGBA1C 12.0 (H) 12/14/2021    Assessment & Plan   1. Type 2 diabetes mellitus with other specified complication, with long-term current use of insulin (HCC) Check blood sugars fasting and bedtime.  Eliminate sugar and starchy foods. - Glucose (CBG) - Insulin NPH, Human,, Isophane, (HUMULIN N KWIKPEN) 100 UNIT/ML Kiwkpen; Inject 20 Units into the skin 2 (two) times daily.  Dispense: 30 mL; Refill: 6 - Semaglutide,0.25 or 0.5MG /DOS, (OZEMPIC, 0.25 OR 0.5 MG/DOSE,) 2 MG/1.5ML SOPN; Inject 0.25 mg into the skin once a week.  Dispense: 2 mL; Refill: 6 - Insulin Pen Needle 32G X 4 MM MISC; Use as directed to inject insulin up to 4 times daily.  Dispense: 100 each; Refill: 0  2. At high risk for cardiovascular disease - atorvastatin (LIPITOR) 20 MG tablet; Take 1 tablet (20 mg total) by mouth daily.  Dispense: 90 tablet; Refill: 1  3. Diabetic foot infection (HCC) Continue wound care and ortho appts    Return if symptoms worsen or fail to improve, for keep appt with Dr 02/11/2022 in October.  The patient was given clear instructions to go to ER or return to medical center if symptoms don't improve, worsen or new problems develop. The patient verbalized understanding.  The patient was told to call to get lab results if they haven't heard anything in the next week.      November, PA-C [facility] [place], [place]   07/06/2022, 2:41 PM

## 2022-07-06 NOTE — Patient Instructions (Signed)
Continue wound care center and ortho f/up  Osteomyelitis, Adult  Bone infections, also called osteomyelitis,occur when bacteria or other germs get inside a bone. This can happen if you have an infection in another part of your body that spreads through your blood. It can also happen if you have a wound or a broken bone (fracture) that breaks the skin. A wound or a fracture can allow germs from your skin or from outside of your body to spread to your bone. Bone infections need to be treated quickly to: Prevent bone damage. Prevent the infection from spreading to other areas of your body. What are the causes? Most bone infections are caused by bacteria. The most common bacteria is one found on the skin (staphylococcus). Bone infections can also be caused by other germs, such as viruses and funguses. What increases the risk? You are more likely to develop this condition if: You recently had surgery, especially bone or joint surgery. You have had an injury, such as stepping on a nail or having a fracture that exposes bones through the skin. You have a long-term (chronic) disease, such as: Diabetes. HIV (human immunodeficiency virus). Rheumatoid arthritis. Sickle cell anemia. Kidney disease that requires dialysis. You have a condition that affects your body's defense system (immune system) or you take medicines that block or weaken the immune system. You have a condition that reduces your blood flow. You have an artificial joint. You have had a joint or bone repaired with plates or screws. You use IV drugs. What are the signs or symptoms? Symptoms vary depending on the type and location of your infection. Common symptoms of bone infections include: Fever and chills. Skin redness and warmth. Swelling. Pain and stiffness. Drainage of fluid or pus near the infection. How is this diagnosed? This condition may be diagnosed based on: Your symptoms and medical history. A physical exam. Tests,  such as: A sample of tissue, fluid, or blood taken to be examined under a microscope. Pus or discharge swabbed from a wound for testing. This is to identify the type of germs and to determine what type of medicine will kill them. Blood tests. Imaging studies, including X-rays, MRI, CT scan, bone scan, or ultrasound. How is this treated? Treatment for this condition depends on the cause and type of infection. Antibiotic medicines are usually the first treatment for a bone infection. This may be done in a hospital at first. You may have to continue IV antibiotics at home or take antibiotics by mouth for several weeks after that. Other treatments may include surgery to: Remove dead or dying tissue from a bone. Remove an infected artificial joint. Remove infected plates or screws that were used to repair a broken bone. Follow these instructions at home: Medicines Take over-the-counter and prescription medicines as told by your health care provider. Finish all antibiotic medicine even if you start to feel better. Follow instructions from your health care provider about how to take IV antibiotics at home. You may need to have a nurse come to your home to give you the IV antibiotics. Managing pain, stiffness, and swelling If directed, put ice on the affected area. To do this: Put ice in a plastic bag. Place a towel between your skin and the bag. Leave the ice on for 20 minutes, 2-3 times a day.  General instructions Ask your health care provider if you have any restrictions on your activities. Do not use any products that contain nicotine or tobacco, such as cigarettes, e-cigarettes,  and chewing tobacco. If you need help quitting, ask your health care provider. Keep all follow-up visits as told by your health care provider. This is important. How is this prevented? Wash your hands often to stop the spread of germs. Wash your hands for at least 20 seconds with soap and water. If soap and water are  not available, use hand sanitizer. Keep any open areas, cuts, or wounds clean. Apply a clean bandage after cleaning the area. Check wounds frequently for signs of infections. Signs of infection include redness, swelling, warmth, pus, or a bad smell. Wear proper footwear to avoid injuries to the feet. Contact a health care provider if: You develop a fever or chills. You have redness, warmth, pain, or swelling that returns after treatment. Get help right away if: You have rapid breathing or you have trouble breathing. You have chest pain. You cannot drink fluids or make urine. The affected area swells, changes color, or turns blue. You have numbness or severe pain in the affected area. These symptoms may represent a serious problem that is an emergency. Do not wait to see if the symptoms will go away. Get medical help right away. Call your local emergency services (911 in the [place] Do not drive yourself to the hospital. Summary Bone infections, also called osteomyelitis,occur when bacteria or other germs get inside a bone. You are more likely to get this type of infection if you have a condition that lowers your ability to fight infections. You are also likely to get this condition if you take medicines that block or weaken the immune system. Most bone infections are caused by bacteria. They can also be caused by other germs, such as viruses and funguses. Treatment for this condition usually starts with taking antibiotics. Further treatment depends on the cause and type of infection. This information is not intended to replace advice given to you by your health care provider. Make sure you discuss any questions you have with your health care provider. Document Revised: 02/05/2020 Document Reviewed: 02/05/2020 Elsevier Patient Education  2023 [employer].

## 2022-07-07 ENCOUNTER — Other Ambulatory Visit: Payer: Self-pay

## 2022-07-07 ENCOUNTER — Telehealth: Payer: Self-pay

## 2022-07-07 MED ORDER — TRULICITY 0.75 MG/0.5ML ~~LOC~~ SOAJ
0.7500 mg | SUBCUTANEOUS | 1 refills | Status: DC
  Filled 2022-07-07: qty 2, 28d supply, fill #0

## 2022-07-07 NOTE — Telephone Encounter (Signed)
Rx for Trulicity sent.  

## 2022-07-07 NOTE — Telephone Encounter (Signed)
Ozempic PA has been denied.  Insurance prefers Surveyor, minerals.  Pt must have trial and failure of both alternatives before approval.

## 2022-07-10 ENCOUNTER — Ambulatory Visit (INDEPENDENT_AMBULATORY_CARE_PROVIDER_SITE_OTHER): Payer: 59 | Admitting: Orthopedic Surgery

## 2022-07-10 DIAGNOSIS — L97424 Non-pressure chronic ulcer of left heel and midfoot with necrosis of bone: Secondary | ICD-10-CM

## 2022-07-10 DIAGNOSIS — E11621 Type 2 diabetes mellitus with foot ulcer: Secondary | ICD-10-CM | POA: Diagnosis not present

## 2022-07-10 MED ORDER — METHOCARBAMOL 500 MG PO TABS: 500.0000 mg | ORAL_TABLET | Freq: Four times a day (QID) | ORAL | 0 refills | Status: DC | PRN

## 2022-07-10 NOTE — Progress Notes (Signed)
[name], [name] ([id number]) Visit Report for 07/06/2022 Chief Complaint Document Details Patient Name: [name], [name] Date of Service: 07/06/2022 11:00 AM Medical Record Number: [id number] Patient Account Number: [id number] Date of Birth/Sex: 1966/07/10 (56 y.o. 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 M) Treating RN: Yevonne PaxEpps, Carrie Primary Care Provider: PATIENT, NO Other Clinician: Referring Provider: Duanne Guessannon, Jennifer Treating Provider/Extender: Rowan BlaseStone, Xai Frerking Weeks in Treatment: 3 Debridement Performed for Wound #1 Left,Plantar Foot Assessment: Performed By: Physician Nelida MeuseStone, Brynne Doane E., PA-C Debridement Type: Debridement Severity of Tissue Pre Debridement: Necrosis of bone Level of Consciousness (Pre- Awake and Alert procedure): Pre-procedure Verification/Time Out Yes - 11:38 Taken: Start Time: 11:38 Total Area Debrided (L x W): 13.5 (cm) x 8 (cm) = 108 (cm) Tissue and other material Viable, Non-Viable, Bone, Slough, Subcutaneous, Biofilm, Slough debrided: Level: Skin/Subcutaneous Tissue/Muscle/Bone Debridement Description: Excisional Instrument: Curette Bleeding: Moderate Hemostasis Achieved: Pressure End Time: 11:42 Response to Treatment: Procedure was tolerated well Level of Consciousness (Post- Awake and  Alert procedure): Post Debridement Measurements of Total Wound Length: (cm) 13.52 Width: (cm) 8 Depth: (cm) 0.5 Volume: (cm) 42.474 Character of Wound/Ulcer Post Debridement: Improved Severity of Tissue Post Debridement: Necrosis of bone Post Procedure Diagnosis Same as Pre-procedure Electronic Signature(s) Signed: 07/06/2022 6:41:29 PM By: Lenda KelpStone III, Dev Dhondt PA-C Signed: 07/10/2022 8:34:39 AM By: Yevonne PaxEpps, Carrie RN Entered By: Yevonne PaxEpps, Carrie on 07/06/2022 11:51:50 [name], [name] ([id number]) -------------------------------------------------------------------------------- HPI Details Patient Name: [name], [name] Date of Service: 07/06/2022 11:00 AM Medical Record Number: [id number] Patient Account Number: [id number] Date of Birth/Sex: 1966/07/10 (56 y.o. M) Treating RN: Yevonne PaxEpps, Carrie Primary Care Provider: PATIENT, NO Other Clinician: Referring Provider: Duanne Guessannon, Jennifer Treating Provider/Extender: Rowan BlaseStone, Tionne Carelli Weeks in Treatment: 3 History of Present Illness HPI Description: [facility] Dr. America Brownannon's care:   ADMISSION6/15/2023This is a 56 year old poorly controlled type II diabetic (last A1c 11.5%). In January of this year, he presented to the hospital with an abscess in his left foot. He also had a foreign body present. It was removed in the ER and he was admitted to the hospital for IV antibiotics. He was subsequently discharged on oral antibiotics. Due to financial concerns, he has not taken his diabetic medications (metformin and 70/30 insulin) and as a result, presented back to the emergency room on June 1 with worsening findings, including frank pus and osteomyelitis on MRI. BKA was recommended, but he declined. He was taken to the operating room by Dr. Lajoyce Cornersuda who performed an extensive debridement. Cultures were taken and he was placed on IV antibiotics. He saw infectious disease while in the hospital who prescribed a 6-week course of IV antibiotics  including cefazolin as well as oral metronidazole and levofloxacin. The infectious disease provider also indicated that he would benefit from below-knee amputation, but the patient desired another opinion and therefore he has been referred to wound care center for further evaluation and management. ABIs obtained while he was in the hospital demonstrate adequate blood flow. Essentially the entire bottom of the patient's left foot has been removed. The heads  of the majority of his metatarsal bones are exposed along with their accompanying tendons. Muscle and fat are exposed as well. The wound surface is fairly dry; the patient reports that he was not given any specific wound care instructions other than to place a dry dressing on it after showering. There is no odor or purulent drainage identified. 05/26/2022: The wound measures a little bit smaller today. There is some good granulation tissue beginning to form on the surface. He still has exposed bone and tendon. There is some slough accumulation. He saw infectious disease earlier this week and was frustrated that they recommended amputation. He is interested in another surgical opinion. He continues to smoke but says that he has cut down. He is using a knee scooter for mobility so that he can stay off of his foot. 06/05/2022: He saw Dr. Lajoyce Corners last week who was supportive of our efforts to try to salvage the foot. I referred him to vascular surgery to see Dr. Chestine Spore but he has not received an appointment yet. He continues on IV antibiotics. He says that after our conversation last week about him being unable to initiate hyperbarics if he was going to continue to smoke, he has not had a cigarette since. He is now at 4 weeks of conventional management and his insurance was initiated on July 1. Today, the wound continues to show evidence of improvement. There is good granulation tissue forming. The metatarsals are still exposed but actually have some pink  periosteum. There is still some slough and fibrinous exudate present. No odor or purulent drainage. We have just been using Dakin's dressing changes for now. Admission to Wound Care Center at [facility]: 06-15-2022 upon evaluation today patient presents for initial inspection here in our clinic though he has been seen by Dr. Lady Gary in [place] up to this point. I did review her notes and records as well they have been using Dakin's moistened gauze dressing which has done a great job at helping to clean out the bottom of his foot. Please see the discourse above for additional information in regard to treatment course up until the patient arrives here in the [place] clinic today. The patient is ready to get started with HBO therapy as soon as possible I do think based on all my review of his records this seems to be appropriate he does have chronic osteomyelitis is also been offered amputation this is obviously a limb salvage operation here. We are trying to prevent him from having to undergo an extensive amputation which would likely be a below-knee amputation which in turn is going to affect his quality of life he wants to try to avoid that if at all possible I think hyperbaric oxygen therapy is his best bet to achieve that goal. Currently he has been on IV Ancef. That actually ends tomorrow. Following that according to notes it appears he supposed to be taking Levaquin and Flagyl for an additional period of time although I am not certain exactly how long that with the. He has been seeing regional physicians infectious disease in [place] and again that so I recommended that he contact today in order to find out what the plan is going forward. His most recent hemoglobin A1c as noted above was 11.5 on 05-07-2022 he tells me trying to work on that he is also quit smoking as of this point. 06-22-2022 upon evaluation today patient appears to be doing well currently in regard to his wound all things  considered. He is  showing signs of a lot of new granulation tissue and overall very pleased. He is changing this once a day with the Dakin's moistened gauze dressing. Fortunately I do not see any evidence of infection locally or systemically at this time. We have been trying to get him into the hyperbaric chamber he was having a lot of issues with sinus pressure and we have made referral and called multiple ENT specialist the earliest we have been able to get his August 9 at [facility] ear nose throat. They are going to try to work him in sooner if they have any opportunities to do so. Obviously I think the sooner he can do this the better. We need to get him in hyperbarics as quickly as possible. 7/26; patient presents for follow-up. He has been using Dakin's wet-to-dry dressings and oil emollient dressing over the exposed bone. He currently denies systemic signs of infection. He states he is scheduled to see infectious disease, Dr Thedore Mins next week. She had ordered an MRI of the left foot and he is scheduled to have this done tomorrow. He currently denies systemic signs of infection Including fever/chills, nausea/vomiting increased warmth or erythema to the wound bed or purulent drainage. He started hyperbaric oxygen treatment however did not tolerate this due to sinus pressure. He is scheduled to see ENT in 2 weeks for evaluation. He is not continuing HBO until he is cleared by ENT. 07-06-2022 patient presents today for follow-up concerning his plantar foot ulcer. The good news is this actually is significantly better compared to previous. Some of the bone which is necrotic is starting to slough off and I am going to perform debridement to clear this away today. Nonetheless I do think that the patient is going to need hyperbarics and we need to try to get it as soon as possible. He still having a lot of sinus pressure I can even hear the congestion today. He is on 3 different antibiotics cefadroxil,  metronidazole, and Levaquin currently for the osteomyelitis. For that reason this should actually help with the infection if he does have a chronic sinusitis I am also going to see about getting him on steroids to try to see if this can benefit him as well. He voiced understanding and he is in agreement with giving that shot. This is probably can to spike his blood sugars which I discussed with him he is going to be seeing his primary care provider later today I want him to let her know [name], [name] ([id number]) as well what is going on and why so that she could be aware but I really do think he needs to take the prednisone. We need to get him in the chamber as soon as possible to try to help with getting this wound to heal and closed so that he does not lose his foot. Electronic Signature(s) Signed: 07/06/2022 5:16:35 PM By: Lenda Kelp PA-C Entered By: Lenda Kelp on 07/06/2022 17:16:35 [name] ([id number]) -------------------------------------------------------------------------------- Physical Exam Details Patient Name: [name] Date of Service: 07/06/2022 11:00 AM Medical Record Number: [id number] Patient Account Number: [id number] Date of Birth/Sex: 02-Mar-1966 (56 y.o. M) Treating RN: Yevonne Pax Primary Care Provider: PATIENT, NO Other Clinician: Referring Provider: Duanne Guess Treating Provider/Extender: Rowan Blase in Treatment: 3 Constitutional Well-nourished and well-hydrated in no acute distress. Respiratory normal breathing without difficulty. Psychiatric this patient is able to make decisions and demonstrates good insight into disease process. Alert and Oriented x 3. pleasant and cooperative. Notes Upon  inspection patient's wound bed actually showed signs of good granulation and epithelization at this point. Fortunately I do not see any evidence of active infection locally or systemically which is great news and overall I am extremely pleased with where  we stand currently. I did perform debridement which includes some of the necrotic bone today he tolerated this without complication and postdebridement the wound bed appears to be doing much better which is great news. I do not see any signs of infection at this time. The entire surface of the wound was debrided today. Electronic Signature(s) Signed: 07/06/2022 5:17:27 PM By: Lenda Kelp PA-C Entered By: Lenda Kelp on 07/06/2022 17:17:27 [name] ([id number]) -------------------------------------------------------------------------------- Physician Orders Details Patient Name: [name] Date of Service: 07/06/2022 11:00 AM Medical Record Number: [id number] Patient Account Number: [id number] Date of Birth/Sex: 09/11/1966 (56 y.o. M) Treating RN: Yevonne Pax Primary Care Provider: PATIENT, NO Other Clinician: Referring Provider: Duanne Guess Treating Provider/Extender: Rowan Blase in Treatment: 3 Verbal / Phone Orders: No Diagnosis Coding Follow-up Appointments o Return Appointment in 1 week. Bathing/ Shower/ Hygiene o May shower; gently cleanse wound with antibacterial soap, rinse and pat dry prior to dressing wounds Edema Control - Lymphedema / Segmental Compressive Device / Other o Elevate, Exercise Daily and Avoid Standing for Long Periods of Time. o Elevate legs to the level of the heart and pump ankles as often as possible o Elevate leg(s) parallel to the floor when sitting. Hyperbaric Oxygen Therapy o Evaluate for HBO Therapy o Indication and location: - osteomyelitis left foot o 2.5 ATA for 90 Minutes with 2 Five (5) Minute Air Breaks - total 40 treatments o Total # of Treatments: - 40 o Antihistamine 30 minutes prior to HBO Treatment, difficulty clearing ears. o Finger stick Blood Glucose Pre- and Post- HBOT Treatment. o Follow Hyperbaric Oxygen Glycemia Protocol Wound Treatment Wound #1 - Foot Wound Laterality: Plantar,  Left Cleanser: Dakin 16 (oz) 0.25 1 x Per Day/30 Days Discharge Instructions: Use as directed. Primary Dressing: Gauze (Generic) 1 x Per Day/30 Days Discharge Instructions: moistened with 1/4 Dakins Solution Secondary Dressing: ABD Pad 5x9 (in/in) (Generic) 1 x Per Day/30 Days Discharge Instructions: Cover with ABD pad Secondary Dressing: Kerlix 4.5 x 4.1 (in/yd) (Generic) 1 x Per Day/30 Days Discharge Instructions: Apply Kerlix 4.5 x 4.1 (in/yd) as instructed Secured With: Medipore Tape - 37M Medipore H Soft Cloth Surgical Tape, 2x2 (in/yd) (Generic) 1 x Per Day/30 Days Patient Medications Allergies: No Known Allergies Notifications Medication Indication Start End prednisone 07/06/2022 DOSE 1 - oral 50 mg tablet - 1 tablet oral taken 1 time per day with food for 7 days. Please closely monitor blood sugars GLYCEMIA INTERVENTIONS PROTOCOL PRE-HBO GLYCEMIA INTERVENTIONS ACTION INTERVENTION Obtain pre-HBO capillary blood glucose (ensure 1 physician order is in chart). 2 If result is 70 mg/dl or below: A. Notify HBO physician and await physician orders. [name], [name] ([id number]) B. If the result meets the hospital definition of a critical result, follow hospital policy. A. Give patient an 8 ounce Glucerna Shake, an 8 ounce Ensure, or 8 ounces of a Glucerna/Ensure equivalent dietary supplement*. B. Wait 30 minutes. If result is 71 mg/dl to 086 mg/dl: C. Retest patientos capillary blood glucose (CBG). D. If result greater than or equal to 110 mg/dl, proceed with HBO. If result less than 110 mg/dl, notify HBO physician and consider holding HBO. If result is 131 mg/dl to 578 mg/dl: A. Proceed with HBO. A. Notify HBO physician and await physician  orders. B. It is recommended to hold HBO and do If result is 250 mg/dl or greater: blood/urine ketone testing. C. If the result meets the hospital definition of a critical result, follow hospital policy. POST-HBO GLYCEMIA  INTERVENTIONS ACTION INTERVENTION Obtain post HBO capillary blood glucose (ensure 1 physician order is in chart). A. Notify HBO physician and await physician orders. 2 If result is 70 mg/dl or below: B. If the result meets the hospital definition of a critical result, follow hospital policy. A. Give patient an 8 ounce Glucerna Shake, an 8 ounce Ensure, or 8 ounces of a Glucerna/Ensure equivalent dietary supplement*. B. Wait 15 minutes for symptoms of hypoglycemia (i.e. nervousness, anxiety, If result is 71 mg/dl to 132 mg/dl: sweating, chills, clamminess, irritability, confusion, tachycardia or dizziness). C. If patient asymptomatic, discharge patient. If patient symptomatic, repeat capillary blood glucose (CBG) and notify HBO physician. If result is 101 mg/dl to 440 mg/dl: A. Discharge patient. A. Notify HBO physician and await physician orders. B. It is recommended to do blood/urine If result is 250 mg/dl or greater: ketone testing. C. If the result meets the hospital definition of a critical result, follow hospital policy. *Juice or candies are NOT equivalent products. If patient refuses the Glucerna or Ensure, please consult the hospital dietitian for an appropriate substitute. Electronic Signature(s) Signed: 07/06/2022 12:18:54 PM By: Lenda Kelp PA-C Entered By: Lenda Kelp on 07/06/2022 12:18:54 [name] ([id number]) -------------------------------------------------------------------------------- Problem List Details Patient Name: [name] Date of Service: 07/06/2022 11:00 AM Medical Record Number: [id number] Patient Account Number: [id number] Date of Birth/Sex: 10/18/1966 (56 y.o. 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 America Brown care:   ADMISSION6/15/2023This is a 56 year old poorly controlled type II diabetic (last A1c 11.5%). In January of this year, he presented to the hospital with an abscess in his left foot. He also had a foreign body present. It was removed in the ER and he was admitted to the hospital for IV antibiotics. He was subsequently discharged on oral antibiotics. Due to financial concerns, he has not taken his diabetic medications (metformin and 70/30 insulin) and as a result, presented back to the emergency room on June 1 with worsening findings, including frank pus and osteomyelitis on MRI. BKA was recommended, but he declined. He was taken to the operating room by Dr. Lajoyce Corners who performed an extensive debridement. Cultures were taken and he was placed on IV antibiotics. He saw infectious disease while in the hospital  who prescribed a  6-week course of IV antibiotics including cefazolin as well as oral metronidazole and levofloxacin. The infectious disease provider also indicated that he would benefit from below-knee amputation, but the patient desired another opinion and therefore he has been referred to wound care center for further evaluation and management. ABIs obtained while he was in the hospital demonstrate adequate blood flow. Essentially the entire bottom of the patient's left foot has been removed. The heads of the majority of his metatarsal bones are exposed along with their accompanying tendons. Muscle and fat are exposed as well. The wound surface is fairly dry; the patient reports that he was not given any specific wound care instructions other than to place a dry dressing on it after showering. There is no odor or purulent drainage identified. 05/26/2022: The wound measures a little bit smaller today. There is some good granulation tissue beginning to form on the surface. He still has exposed bone and tendon. There is some slough accumulation. He saw infectious disease earlier this week and was frustrated that they recommended amputation. He is interested in another surgical opinion. He continues to smoke but says that he has cut down. He is using a knee scooter for mobility so that he can stay off of his foot. 06/05/2022: He saw Dr. Lajoyce Corners last week who was supportive of our efforts to try to salvage the foot. I referred him to vascular surgery to see Dr. Chestine Spore but he has not received an appointment yet. He continues on IV antibiotics. He says that after our conversation last week about him being unable to initiate hyperbarics if he was going to continue to smoke, he has not had a cigarette since. He is now at 4 weeks of conventional management and his insurance was initiated on July 1. Today, the wound continues to show evidence of improvement. There is good granulation tissue forming. The metatarsals  are still exposed but actually have some pink periosteum. There is still some slough and fibrinous exudate present. No odor or purulent drainage. We have just been using Dakin's dressing changes for now. Admission to Wound Care Center at [facility]: 06-15-2022 upon evaluation today patient presents for initial inspection here in our clinic though he has been seen by Dr. Lady Gary in [place] up to this point. I did review her notes and records as well they have been using Dakin's moistened gauze dressing which has done a great job at helping to clean out the bottom of his foot. Please see the discourse above for additional information in regard to treatment course up until the patient arrives here in the [place] clinic today. The patient is ready to get started with HBO therapy as soon as possible I do think based on all my review of his records this seems to be appropriate he does have chronic osteomyelitis is also been offered amputation this is obviously a limb salvage operation here. We are trying to prevent him from having to undergo an extensive amputation which would likely be a below-knee amputation which in turn is going to affect his quality of life he wants to try to avoid that if at all possible I think hyperbaric oxygen therapy is his best bet to achieve that goal. Currently he has been on IV Ancef. That actually ends tomorrow. Following that according to notes it appears he supposed to be taking Levaquin and Flagyl for an additional period of time although I am not certain exactly how long that with the. He has been seeing regional physicians infectious disease  in [place] and again that so I recommended that he contact today in order to find out what the plan is going forward. His most recent hemoglobin A1c as noted above was 11.5 on 05-07-2022 he tells me trying to work on that he is also quit smoking as of this point. 06-22-2022 upon evaluation today patient appears to be doing well  currently in regard to his wound all things considered. He is showing signs of a lot of new granulation tissue and overall very pleased. He is changing this once a day with the Dakin's moistened gauze dressing. Fortunately I do not see any evidence of infection locally or systemically at this time. We have been trying to get him into the hyperbaric chamber he was having a lot of issues with sinus pressure and we have made referral and called multiple ENT specialist the earliest we have been able to get his August 9 at [facility] ear nose throat. They are going to try to work him in sooner if they have any opportunities to do so. Obviously I think the sooner he can do this the better. We need to get him in hyperbarics as quickly as possible. 7/26; patient presents for follow-up. He has been using Dakin's wet-to-dry dressings and oil emollient dressing over the exposed bone. He currently denies systemic signs of infection. He states he is scheduled to see infectious disease, Dr Thedore Mins next week. She had ordered an MRI of the left foot and he is scheduled to have this done tomorrow. He currently denies systemic signs of infection Including fever/chills, nausea/vomiting increased warmth or erythema to the wound bed or purulent drainage. He started hyperbaric oxygen treatment however did not tolerate this due to sinus pressure. He is scheduled to see ENT in 2 weeks for evaluation. He is not continuing HBO until he is cleared by ENT. 07-06-2022 patient presents today for follow-up concerning his plantar foot ulcer. The good news is this actually is significantly better compared to previous. Some of the bone which is necrotic is starting to slough off and I am going to perform debridement to clear this away today. DAVIS, AMBROSINI ([id number]) Nonetheless I do think that the patient is going to need hyperbarics and we need to try to get it as soon as possible. He still having a lot of sinus pressure I can even hear the  congestion today. He is on 3 different antibiotics cefadroxil, metronidazole, and Levaquin currently for the osteomyelitis. For that reason this should actually help with the infection if he does have a chronic sinusitis I am also going to see about getting him on steroids to try to see if this can benefit him as well. He voiced understanding and he is in agreement with giving that shot. This is probably can to spike his blood sugars which I discussed with him he is going to be seeing his primary care provider later today I want him to let her know as well what is going on and why so that she could be aware but I really do think he needs to take the prednisone. We need to get him in the chamber as soon as possible to try to help with getting this wound to heal and closed so that he does not lose his foot. Objective Constitutional Well-nourished and well-hydrated in no acute distress. Vitals Time Taken: 11:17 AM, Height: 70 in, Weight: 230 lbs, BMI: 33, Temperature: 98.3 F, Pulse: 83 bpm, Respiratory Rate: 18 breaths/min, Blood Pressure: 178/71 mmHg. Respiratory normal  breathing without difficulty. Psychiatric this patient is able to make decisions and demonstrates good insight into disease process. Alert and Oriented x 3. pleasant and cooperative. General Notes: Upon inspection patient's wound bed actually showed signs of good granulation and epithelization at this point. Fortunately I do not see any evidence of active infection locally or systemically which is great news and overall I am extremely pleased with where we stand currently. I did perform debridement which includes some of the necrotic bone today he tolerated this without complication and postdebridement the wound bed appears to be doing much better which is great news. I do not see any signs of infection at this time. The entire surface of the wound was debrided today. Integumentary (Hair, Skin) Wound #1 status is Open. Original  cause of wound was Gradually Appeared. The date acquired was: 12/04/2021. The wound has been in treatment 3 weeks. The wound is located on the Left,Plantar Foot. The wound measures 13.5cm length x 8cm width x 0.4cm depth; 84.823cm^2 area and 33.929cm^3 volume. There is bone, muscle, tendon, Fat Layer (Subcutaneous Tissue), and fascia exposed. There is no tunneling or undermining noted. There is a medium amount of serosanguineous drainage noted. There is large (67-100%) pink, pale, hyper - granulation within the wound bed. There is a small (1-33%) amount of necrotic tissue within the wound bed including Adherent Slough and Necrosis of Muscle. Assessment Active Problems ICD-10 Other chronic osteomyelitis, left ankle and foot Type 2 diabetes mellitus with foot ulcer Non-pressure chronic ulcer of other part of left foot with necrosis of bone Procedures Wound #1 Pre-procedure diagnosis of Wound #1 is a Diabetic Wound/Ulcer of the Lower Extremity located on the Left,Plantar Foot .Severity of Tissue Pre Debridement is: Necrosis of bone. There was a Excisional Skin/Subcutaneous Tissue/Muscle/Bone Debridement with a total area of 108 sq cm performed by Nelida Meuse., PA-C. With the following instrument(s): Curette to remove Viable and Non-Viable tissue/material. Material removed includes Bone,Subcutaneous Tissue, Slough, and Biofilm. No specimens were taken. A time out was conducted at 11:38, prior to the start of the procedure. A Moderate amount of bleeding was controlled with Pressure. The procedure was tolerated well. Post Debridement Measurements: 13.52cm length x 8cm width x 0.5cm depth; 42.474cm^3 volume. Character of Wound/Ulcer Post Debridement is improved. Severity of Tissue Post Debridement is: Necrosis of bone. Post procedure Diagnosis Wound #1: Same as Pre-Procedure [name], [name] ([id number]) Plan Follow-up Appointments: Return Appointment in 1 week. Bathing/ Shower/ Hygiene: May shower;  gently cleanse wound with antibacterial soap, rinse and pat dry prior to dressing wounds Edema Control - Lymphedema / Segmental Compressive Device / Other: Elevate, Exercise Daily and Avoid Standing for Long Periods of Time. Elevate legs to the level of the heart and pump ankles as often as possible Elevate leg(s) parallel to the floor when sitting. Hyperbaric Oxygen Therapy: Evaluate for HBO Therapy Indication and location: - osteomyelitis left foot 2.5 ATA for 90 Minutes with 2 Five (5) Minute Air Breaks - total 40 treatments Total # of Treatments: - 40 Antihistamine 30 minutes prior to HBO Treatment, difficulty clearing ears. Finger stick Blood Glucose Pre- and Post- HBOT Treatment. Follow Hyperbaric Oxygen Glycemia Protocol The following medication(s) was prescribed: prednisone oral 50 mg tablet 1 1 tablet oral taken 1 time per day with food for 7 days. Please closely monitor blood sugars starting 07/06/2022 WOUND #1: - Foot Wound Laterality: Plantar, Left Cleanser: Dakin 16 (oz) 0.25 1 x Per Day/30 Days Discharge Instructions: Use as directed. Primary Dressing: Gauze (  Generic) 1 x Per Day/30 Days Discharge Instructions: moistened with 1/4 Dakins Solution Secondary Dressing: ABD Pad 5x9 (in/in) (Generic) 1 x Per Day/30 Days Discharge Instructions: Cover with ABD pad Secondary Dressing: Kerlix 4.5 x 4.1 (in/yd) (Generic) 1 x Per Day/30 Days Discharge Instructions: Apply Kerlix 4.5 x 4.1 (in/yd) as instructed Secured With: Medipore Tape - 55M Medipore H Soft Cloth Surgical Tape, 2x2 (in/yd) (Generic) 1 x Per Day/30 Days 1. I am good recommend that we go ahead and continue with the wound care measures as before and the patient is in agreement with plan. This includes the use of the Dakin's moistened gauze dressing which I think is doing a good job. Overall I do think that he may be close to switching over to [facility] which could be a good option for him as well but again right now  think the Dakin's is best that both help with infection as well as help with clearing away some of the necrotic debris he no longer is going to necessitate the use of the Adaptic which I think should help as well. 2. I am also can recommend that we continue with ABD pad to cover roll gauze to secure in place. 3. I would suggest as well that he continue to offload aggressively trying to keep pressure off is much as possible to allow this to heal as quickly as possible. 4. With regard to the sinus pressure issue we have been doing sinus medications that I recommended for him. We have also made a referral to ENT. With that being said he is now on 3 different medications as noted above and I am going to go ahead and place him on prednisone for short course to try to help with inflammation as well. My hope is that we can get this under control so we can get him in the chamber he really needs to have this started ASAP to help save his foot. We will see patient back for reevaluation in 1 week here in the clinic. If anything worsens or changes patient will contact our office for additional recommendations. Electronic Signature(s) Signed: 07/06/2022 5:18:49 PM By: Lenda Kelp PA-C Entered By: Lenda Kelp on 07/06/2022 17:18:49 [name] ([id number]) -------------------------------------------------------------------------------- SuperBill Details Patient Name: [name] Date of Service: 07/06/2022 Medical Record Number: [id number] Patient Account Number: [id number] Date of Birth/Sex: Mar 23, 1966 (56 y.o. M) Treating RN: Yevonne Pax Primary Care Provider: PATIENT, NO Other Clinician: Referring Provider: Duanne Guess Treating Provider/Extender: Rowan Blase in Treatment: 3 Diagnosis Coding ICD-10 Codes Code Description [phone] Other chronic osteomyelitis, left ankle and foot E11.621 Type 2 diabetes mellitus with foot ulcer L97.524 Non-pressure chronic ulcer of other part of left  foot with necrosis of bone Facility Procedures CPT4 Code: [id number] Description: 11044 - DEB BONE 20 SQ CM/< Modifier: Quantity: 1 CPT4 Code: Description: ICD-10 Diagnosis Description L97.524 Non-pressure chronic ulcer of other part of left foot with necrosis of bo Modifier: ne Quantity: CPT4 Code: [id number] Description: 11047 - DEB BONE EA ADDL 20 SQ CM/< Modifier: Quantity: 5 CPT4 Code: Description: ICD-10 Diagnosis Description L97.524 Non-pressure chronic ulcer of other part of left foot with necrosis of bo Modifier: ne Quantity: Physician Procedures CPT4: Description Modifier Quantity Code [id number] - WC PHYS LEVEL 4 - EST PT 25 1 CPT4: ICD-10 Diagnosis Description D32.202 Other chronic osteomyelitis, left ankle and foot E11.621 Type 2 diabetes mellitus with foot ulcer L97.524 Non-pressure chronic ulcer of other part of left foot with necrosis of bone  CPT4: 0768088 Debridement; bone (includes epidermis, dermis, subQ tissue, muscle and/or fascia, if performed) 1st 20 1 sqcm or less CPT4: ICD-10 Diagnosis Description L97.524 Non-pressure chronic ulcer of other part of left foot with necrosis of bone CPT4: 1103159 Excisional Debridement, Bone (includes epidermis, dermis, subcutaneous tissue, muscle and/or fascia, if 5 performed); each additional 20 sqcm, or part thereof CPT4: ICD-10 Diagnosis Description L97.524 Non-pressure chronic ulcer of other part of left foot with necrosis of bone Electronic Signature(s) Signed: 07/06/2022 5:19:09 PM By: Lenda Kelp PA-C Entered By: Lenda Kelp on 07/06/2022 17:19:09

## 2022-07-10 NOTE — Progress Notes (Signed)
Matthew, [name] ([id number]) Visit Report for 07/06/2022 Arrival Information Details Patient Name: [name] [name], [name] [name] Date of Service: 07/06/2022 11:00 AM Medical Record Number: [id number] Patient Account Number: [id number] Date of Birth/Sex: Feb 22, 1966 (56 y.o. M) Treating RN: [name] [name] Primary Care [name] [name]: PATIENT, NO Other Clinician: Referring [name] [name]: [name] [name] Treating [name] [name]/Extender: [name] [name] in Treatment: 3 Visit Information History Since Last Visit All ordered tests and consults were completed: No Patient Arrived: Knee Scooter Added or deleted any medications: No Arrival Time: 11:17 Any new allergies or adverse reactions: No Accompanied By: self Had a fall or experienced change in No Transfer Assistance: None activities of daily living that may affect Patient Identification Verified: Yes risk of falls: Secondary Verification Process Completed: Yes Signs or symptoms of abuse/neglect since last visito No Patient Requires Transmission-Based Precautions: No Hospitalized since last visit: No Patient Has Alerts: No Implantable device outside of the clinic excluding No cellular tissue based products placed in the center since last visit: Has Dressing in Place as Prescribed: Yes Pain Present Now: No Electronic Signature(s) Signed: 07/10/2022 8:34:39 AM By: Matthew Pax RN Entered By: [name] [name] on 07/06/2022 11:17:38 [name] [name] ([id number]) -------------------------------------------------------------------------------- Clinic Level of Care Assessment Details Patient Name: [name], [name] Date of Service: 07/06/2022 11:00 AM Medical Record Number: [id number] Patient Account Number: [id number] Date of Birth/Sex: Sep 14, 1966 (56 y.o. M) Treating RN: [name] [name] Primary Care [name] [name]: PATIENT, NO Other Clinician: Referring [name] [name]: [name] [name] Treating [name] [name]/Extender: [name] [name] in Treatment: 3 Clinic Level of Care Assessment Items TOOL 4 Quantity  Score []  - Use when only an EandM is performed on FOLLOW-UP visit 0 ASSESSMENTS - Nursing Assessment / Reassessment []  - Reassessment of Co-morbidities (includes updates in patient status) 0 []  - 0 Reassessment of Adherence to Treatment Plan ASSESSMENTS - Wound and Skin Assessment / Reassessment []  - Simple Wound Assessment / Reassessment - one wound 0 []  - 0 Complex Wound Assessment / Reassessment - multiple wounds []  - 0 Dermatologic / Skin Assessment (not related to wound area) ASSESSMENTS - Focused Assessment []  - Circumferential Edema Measurements - multi extremities 0 []  - 0 Nutritional Assessment / Counseling / Intervention []  - 0 Lower Extremity Assessment (monofilament, tuning fork, pulses) []  - 0 Peripheral Arterial Disease Assessment (using hand held doppler) ASSESSMENTS - Ostomy and/or Continence Assessment and Care []  - Incontinence Assessment and Management 0 []  - 0 Ostomy Care Assessment and Management (repouching, etc.) PROCESS - Coordination of Care []  - Simple Patient / Family Education for ongoing care 0 []  - 0 Complex (extensive) Patient / Family Education for ongoing care []  - 0 Staff obtains , Records, Test Results / Process Orders []  - 0 Staff telephones HHA, Nursing Homes / Clarify orders / etc []  - 0 Routine Transfer to another Facility (non-emergent condition) []  - 0 Routine Hospital Admission (non-emergent condition) []  - 0 New Admissions / / Ordering NPWT, Apligraf, etc. []  - 0 Emergency Hospital Admission (emergent condition) []  - 0 Simple Discharge Coordination []  - 0 Complex (extensive) Discharge Coordination PROCESS - Special Needs []  - Pediatric / Minor Patient Management 0 []  - 0 Isolation Patient Management []  - 0 Hearing / Language / Visual special needs []  - 0 Assessment of Community assistance (transportation, D/C planning, etc.) []  - 0 Additional assistance / Altered mentation []  - 0 Support  Surface(s) Assessment (bed, cushion, seat, etc.) INTERVENTIONS - Wound Cleansing / Measurement [name] [name] ( ) []  - 0 Simple Wound Cleansing - one wound []  - 0 Complex Wound Cleansing -  multiple wounds []  - 0 Wound Imaging (photographs - any number of wounds) []  - 0 Wound Tracing (instead of photographs) []  - 0 Simple Wound Measurement - one wound []  - 0 Complex Wound Measurement - multiple wounds INTERVENTIONS - Wound Dressings []  - Small Wound Dressing one or multiple wounds 0 []  - 0 Medium Wound Dressing one or multiple wounds []  - 0 Large Wound Dressing one or multiple wounds []  - 0 Application of Medications - topical []  - 0 Application of Medications - injection INTERVENTIONS - Miscellaneous []  - External ear exam 0 []  - 0 Specimen Collection (cultures, biopsies, blood, body fluids, etc.) []  - 0 Specimen(s) / Culture(s) sent or taken to Lab for analysis []  - 0 Patient Transfer (multiple staff / / Similar devices) []  - 0 Simple Staple / Suture removal (25 or less) []  - 0 Complex Staple / Suture removal (26 or more) []  - 0 Hypo / Hyperglycemic Management (close monitor of Blood Glucose) []  - 0 Ankle / Brachial Index (ABI) - do not check if billed separately []  - 0 Vital Signs Has the patient been seen at the hospital within the last three years: Yes Total Score: 0 Level Of Care: ____ Electronic Signature(s) Signed: 07/10/2022 8:34:39 AM By: RN Entered By: on 07/06/2022 14:44:06 ( ) -------------------------------------------------------------------------------- Encounter Discharge Information Details Patient Name: Date of Service: 07/06/2022 11:00 AM Medical Record Number: Patient Account Number: Date of Birth/Sex: 06-25-1966 (56 y.o. M) Treating RN: Nurse, adult Primary Care [name] [name]: PATIENT, NO Other Clinician: Referring [name] [name]: Treating  [name] [name]/Extender: in Treatment: 3 Encounter Discharge Information Items Post Procedure Vitals Discharge Condition: Stable Temperature (F): 98.3 Ambulatory Status: Ambulatory Pulse (bpm): 83 Discharge Destination: Home Respiratory Rate (breaths/min): 18 Transportation: Private Auto Blood Pressure (mmHg): 178/71 Accompanied By: self Schedule Follow-up Appointment: Yes Clinical Summary of Care: Electronic Signature(s) Signed: 07/10/2022 8:34:39 AM By: RN Entered By: on 07/06/2022 14:45:22 [name] [name] ([name] [name]) -------------------------------------------------------------------------------- Lower Extremity Assessment Details Patient Name: 09/05/2022 Date of Service: 07/06/2022 11:00 AM Medical Record Number: [id number] Patient Account Number: [name] [name] Date of Birth/Sex: [birth year] (56 y.o. M) Treating RN: [id number] Primary Care Katora Fini: PATIENT, NO Other Clinician: Referring AmeLie Hollars: 12/18/1965 Treating Nuvia Hileman/Extender: 53 in Treatment: 3 Edema Assessment Assessed: [Left: No] [Right: No] Edema: [Left: Ye] [Right: s] Calf Left: Right: Point of Measurement: 32 cm From Medial Instep 41 cm Ankle Left: Right: Point of Measurement: 11 cm From Medial Instep 26 cm Vascular Assessment Pulses: Dorsalis Pedis Palpable: [Left:Yes] Electronic Signature(s) Signed: 07/10/2022 8:34:39 AM By: Matthew Guess RN Entered By: [name] [name] on 07/06/2022 11:31:23 [name] [name] ([name] [name]) -------------------------------------------------------------------------------- Multi Wound Chart Details Patient Name: 09/05/2022 Date of Service: 07/06/2022 11:00 AM Medical Record Number: [id number] Patient Account Number: [name] [name] Date of Birth/Sex: 12-06-1965 (56 y.o. M) Treating RN: [id number] Primary Care Walter Grima: PATIENT, NO Other Clinician: Referring Ambree Frances: 12/18/1965 Treating Krysia Zahradnik/Extender: 53 in  Treatment: 3 Vital Signs Height(in): 70 Pulse(bpm): 83 Weight(lbs): 230 Blood Pressure(mmHg): 178/71 Body Mass Index(BMI): 33 Temperature(F): 98.3 Respiratory Rate(breaths/min): 18 Photos: [N/A:N/A] Wound Location: Left, Plantar Foot N/A N/A Wounding Event: Gradually Appeared N/A N/A Primary Etiology: Diabetic Wound/Ulcer of the Lower N/A N/A Extremity Comorbid History: Type II Diabetes N/A N/A Date Acquired: 12/04/2021 N/A N/A Weeks of Treatment: 3 N/A N/A Wound Status: Open N/A N/A Wound Recurrence: No N/A N/A Pending Amputation on Yes N/A N/A Presentation:  Measurements L x W x D (cm) 13.5x8x0.4 N/A N/A Area (cm) : 84.823 N/A N/A Volume (cm) : 33.929 N/A N/A % Reduction in Area: 15.60% N/A N/A % Reduction in Volume: 32.50% N/A N/A Classification: Grade 4 N/A N/A Exudate Amount: Medium N/A N/A Exudate Type: Serosanguineous N/A N/A Exudate Color: red, brown N/A N/A Granulation Amount: Large (67-100%) N/A N/A Granulation Quality: Pink, Pale, Hyper-granulation N/A N/A Necrotic Amount: Small (1-33%) N/A N/A Exposed Structures: Fascia: Yes N/A N/A Fat Layer (Subcutaneous Tissue): Yes Tendon: Yes Muscle: Yes Bone: Yes Joint: No Epithelialization: None N/A N/A Treatment Notes Electronic Signature(s) Signed: 07/10/2022 8:34:39 AM By: Carlene Coria RN Entered By: Carlene Coria on 07/06/2022 11:36:08 [name] [name] ([id number]) -------------------------------------------------------------------------------- Multi-Disciplinary Care Plan Details Patient Name: [name] [name] Date of Service: 07/06/2022 11:00 AM Medical Record Number: [id number] Patient Account Number: [id number] Date of Birth/Sex: 1966-11-09 (56 y.o. M) Treating RN: Carlene Coria Primary Care Japji Kok: PATIENT, NO Other Clinician: Referring Dalton Molesworth: Fredirick Maudlin Treating Jorene Kaylor/Extender: Skipper Cliche in Treatment: 3 Active Inactive Necrotic Tissue Nursing Diagnoses: Impaired tissue integrity related to  necrotic/devitalized tissue Knowledge deficit related to management of necrotic/devitalized tissue Goals: Necrotic/devitalized tissue will be minimized in the wound bed Date Initiated: 06/28/2022 Target Resolution Date: 06/28/2022 Goal Status: Active Patient/caregiver will verbalize understanding of reason and process for debridement of necrotic tissue Date Initiated: 06/28/2022 Target Resolution Date: 06/28/2022 Goal Status: Active Interventions: Assess patient pain level pre-, during and post procedure and prior to discharge Provide education on necrotic tissue and debridement process Treatment Activities: Excisional debridement : 06/28/2022 Notes: Orientation to the Wound Care Program Nursing Diagnoses: Knowledge deficit related to the wound healing center program Goals: Patient/caregiver will verbalize understanding of the [place] Date Initiated: 06/28/2022 Target Resolution Date: 06/28/2022 Goal Status: Active Interventions: Provide education on orientation to the wound center Notes: Osteomyelitis Nursing Diagnoses: Infection: osteomyelitis Knowledge deficit related to disease process and management Potential for infection: osteomyelitis Goals: Diagnostic evaluation for osteomyelitis completed as ordered Date Initiated: 06/28/2022 Target Resolution Date: 06/28/2022 Goal Status: Active Patient/caregiver will verbalize understanding of disease process and disease management Date Initiated: 06/28/2022 Target Resolution Date: 06/28/2022 Goal Status: Active Patient's osteomyelitis will resolve Date Initiated: 06/28/2022 Target Resolution Date: 06/28/2022 BAUTISTA, SPIEGLER ([id number]) Goal Status: Active Signs and symptoms for osteomyelitis will be recognized and promptly addressed Date Initiated: 06/28/2022 Target Resolution Date: 06/28/2022 Goal Status: Active Interventions: Assess for signs and symptoms of osteomyelitis resolution every visit Provide education  on osteomyelitis Screen for HBO Notes: Wound/Skin Impairment Nursing Diagnoses: Knowledge deficit related to ulceration/compromised skin integrity Goals: Patient/caregiver will verbalize understanding of skin care regimen Date Initiated: 06/15/2022 Target Resolution Date: 07/16/2022 Goal Status: Active Ulcer/skin breakdown will have a volume reduction of 30% by week 4 Date Initiated: 06/15/2022 Target Resolution Date: 07/16/2022 Goal Status: Active Ulcer/skin breakdown will have a volume reduction of 50% by week 8 Date Initiated: 06/15/2022 Target Resolution Date: 08/16/2022 Goal Status: Active Ulcer/skin breakdown will have a volume reduction of 80% by week 12 Date Initiated: 06/15/2022 Target Resolution Date: 09/15/2022 Goal Status: Active Ulcer/skin breakdown will heal within 14 weeks Date Initiated: 06/15/2022 Target Resolution Date: 10/16/2022 Goal Status: Active Interventions: Assess patient/caregiver ability to obtain necessary supplies Assess patient/caregiver ability to perform ulcer/skin care regimen upon admission and as needed Assess ulceration(s) every visit Notes: Electronic Signature(s) Signed: 07/10/2022 8:34:39 AM By: Carlene Coria RN Entered By: Carlene Coria on 07/06/2022 11:36:02 [name] [name] ([id number]) -------------------------------------------------------------------------------- Pain Assessment Details Patient Name: [name] [name] Date of Service: 07/06/2022  11:00 AM Medical Record Number: [id number] Patient Account Number: [id number] Date of Birth/Sex: 1965/12/22 (56 y.o. M) Treating RN: Carlene Coria Primary Care Trinity Hyland: PATIENT, NO Other Clinician: Referring Laveta Gilkey: Fredirick Maudlin Treating Pearlie Nies/Extender: Skipper Cliche in Treatment: 3 Active Problems Location of Pain Severity and Description of Pain Patient Has Paino No Site Locations Pain Management and Medication Current Pain Management: Electronic Signature(s) Signed: 07/10/2022 8:34:39 AM  By: Carlene Coria RN Entered By: Carlene Coria on 07/06/2022 Beacon, Daisy ([id number]) -------------------------------------------------------------------------------- Patient/Caregiver Education Details Patient Name: [name] [name] Date of Service: 07/06/2022 11:00 AM Medical Record Number: [id number] Patient Account Number: [id number] Date of Birth/Gender: [birth year] (56 y.o. M) Treating RN: Carlene Coria Primary Care Physician: PATIENT, NO Other Clinician: Referring Physician: Fredirick Maudlin Treating Physician/Extender: Skipper Cliche in Treatment: 3 Education Assessment Education Provided To: Patient Education Topics Provided Infection: Methods: Explain/Verbal Responses: State content correctly Electronic Signature(s) Signed: 07/10/2022 8:34:39 AM By: Carlene Coria RN Entered By: Carlene Coria on 07/06/2022 14:44:25 [name] [name] ([id number]) -------------------------------------------------------------------------------- Wound Assessment Details Patient Name: [name] [name] Date of Service: 07/06/2022 11:00 AM Medical Record Number: [id number] Patient Account Number: [id number] Date of Birth/Sex: May 12, 1966 (56 y.o. M) Treating RN: Carlene Coria Primary Care Gailya Tauer: PATIENT, NO Other Clinician: Referring Winnona Wargo: Fredirick Maudlin Treating Avir Deruiter/Extender: Skipper Cliche in Treatment: 3 Wound Status Wound Number: 1 Primary Etiology: Diabetic Wound/Ulcer of the Lower Extremity Wound Location: Left, Plantar Foot Wound Status: Open Wounding Event: Gradually Appeared Comorbid History: Type II Diabetes Date Acquired: 12/04/2021 Weeks Of Treatment: 3 Clustered Wound: No Pending Amputation On Presentation Photos Wound Measurements Length: (cm) 13.5 Width: (cm) 8 Depth: (cm) 0.4 Area: (cm) 84.823 Volume: (cm) 33.929 % Reduction in Area: 15.6% % Reduction in Volume: 32.5% Epithelialization: None Tunneling: No Undermining: No Wound Description Classification: Grade 4  Fo Exudate Amount: Medium Sl Exudate Type: Serosanguineous Exudate Color: red, brown ul Odor After Cleansing: No ough/Fibrino Yes Wound Bed Granulation Amount: Large (67-100%) Exposed Structure Granulation Quality: Pink, Pale, Hyper-granulation Fascia Exposed: Yes Necrotic Amount: Small (1-33%) Fat Layer (Subcutaneous Tissue) Exposed: Yes Necrotic Quality: Adherent Slough Tendon Exposed: Yes Muscle Exposed: Yes Necrosis of Muscle: Yes Joint Exposed: No Bone Exposed: Yes Treatment Notes Wound #1 (Foot) Wound Laterality: Plantar, Left Cleanser Dakin 16 (oz) 0.25 Discharge Instruction: Use as directed. [name] [name], [name] [name] ([id number]) Peri-Wound Care Topical Primary Dressing Gauze Discharge Instruction: moistened with 1/4 Dakins Solution Secondary Dressing ABD Pad 5x9 (in/in) Discharge Instruction: Cover with ABD pad Kerlix 4.5 x 4.1 (in/yd) Discharge Instruction: Apply Kerlix 4.5 x 4.1 (in/yd) as instructed Secured With Dewart H Soft Cloth Surgical Tape, 2x2 (in/yd) Compression Wrap Compression Stockings Add-Ons Electronic Signature(s) Signed: 07/10/2022 8:34:39 AM By: Carlene Coria RN Entered By: Carlene Coria on 07/06/2022 11:30:25 [name] [name] ([id number]) -------------------------------------------------------------------------------- [place] Details Patient Name: [name] [name] Date of Service: 07/06/2022 11:00 AM Medical Record Number: [id number] Patient Account Number: [id number] Date of Birth/Sex: [birth year] (56 y.o. M) Treating RN: Carlene Coria Primary Care Ramsie Ostrander: PATIENT, NO Other Clinician: Referring Julyanna Scholle: Fredirick Maudlin Treating Magda Muise/Extender: Skipper Cliche in Treatment: 3 Vital Signs Time Taken: 11:17 Temperature (F): 98.3 Height (in): 70 Pulse (bpm): 83 Weight (lbs): 230 Respiratory Rate (breaths/min): 18 Body Mass Index (BMI): 33 Blood Pressure (mmHg): 178/71 Reference Range: 80 - 120 mg / dl Electronic  Signature(s) Signed: 07/10/2022 8:34:39 AM By: Carlene Coria RN Entered By: Carlene Coria on 07/06/2022 11:19:02

## 2022-07-11 ENCOUNTER — Encounter: Payer: Self-pay | Admitting: Orthopedic Surgery

## 2022-07-11 NOTE — Progress Notes (Signed)
Office Visit Note   Patient: [name]           Date of Birth: 04/23/1966           MRN: [id number] Visit Date: 07/10/2022              Requested by: No referring provider defined for this encounter. PCP: Patient, No Pcp Per  Chief Complaint  Patient presents with   Left Foot - Routine Post Op    05/05/22 left foot deb      HPI: Patient is a 56 year old gentleman who initially underwent debridement of a abscess involving the entire plantar aspect of his left foot on June 2.  Patient most recently had an MRI scan of his foot on July 27.    Assessment & Plan: Visit Diagnoses:  1. Diabetic ulcer of left midfoot associated with type 2 diabetes mellitus, with necrosis of bone (HCC)     Plan: Reviewed treatment options.  With the extensive soft tissue defect of the plantar aspect of his foot he will not be able to achieve a durable foot to walk on and with the osteomyelitis involving the forefoot the recommended treatment would be a transtibial amputation to provide him a long-term viable option for ambulation.    Fow-Up Instructions: Return in about 3 weeks (around 07/31/2022).   Ortho Exam  Patient is alert, oriented, no adenopathy, well-dressed, normal affect, normal respiratory effort. Examination patient still has extensive soft tissue defect of the plantar aspect of the foot the MRI scan does show osteomyelitis involving the lesser toe metatarsal heads as well as the sesamoids.  Patient has persistent exposed metatarsal heads.  Imaging: No results found.   Labs: Lab Results  Component Value Date   HGBA1C 11.5 (H) 05/07/2022   HGBA1C 12.0 (H) 12/14/2021   ESRSEDRATE 34 (H) 06/20/2022   ESRSEDRATE 107 (H) 05/06/2022   ESRSEDRATE 65 (H) 05/04/2022   CRP 6.8 06/20/2022   CRP 26.4 (H) 05/06/2022   CRP 27.3 (H) 05/04/2022   REPTSTATUS 05/10/2022 FINAL 05/05/2022   GRAMSTAIN  05/05/2022    RARE WBC PRESENT,BOTH PMN AND MONONUCLEAR ABUNDANT GRAM POSITIVE  COCCI ABUNDANT GRAM NEGATIVE RODS    CULT  05/05/2022    FEW STREPTOCOCCUS GALLOLYTICUS FEW STAPHYLOCOCCUS AUREUS FEW STREPTOCOCCUS GORDONII FEW PREVOTELLA SPECIES BETA LACTAMASE POSITIVE Performed at [facility], [address] [place], [place] [address]    [facility] STAPHYLOCOCCUS AUREUS 05/05/2022   LABORGA STREPTOCOCCUS GORDONII 05/05/2022   LABORGA STREPTOCOCCUS GALLOLYTICUS 05/05/2022     Lab Results  Component Value Date   ALBUMIN 2.2 (L) 05/09/2022   ALBUMIN 2.1 (L) 05/08/2022   ALBUMIN 2.7 (L) 05/07/2022    Lab Results  Component Value Date   MG 1.9 05/09/2022   MG 1.9 05/08/2022   MG 2.3 05/07/2022   No results found for: "VD25OH"  No results found for: "PREALBUMIN"    Latest Ref Rng & Units 06/20/2022   10:47 AM 05/28/2022    4:43 PM 05/11/2022    4:46 AM  CBC EXTENDED  WBC 3.8 - 10.8 Thousand/uL 8.0  6.8  9.2   RBC 4.20 - 5.80 Million/uL 4.25  4.09  3.82   Hemoglobin 13.2 - 17.1 g/dL 35.3  61.4  43.1   HCT 38.5 - 50.0 % 35.4  34.7  32.1   Platelets 140 - 400 Thousand/uL 298  306  412   NEUT# 1,500 - 7,800 cells/uL 4,704  3.9  5.8   Lymph# 850 - 3,900  cells/uL 2,248  2.0  2.1      There is no height or weight on file to calculate BMI.  Orders:  No orders of the defined types were placed in this encounter.  Meds ordered this encounter  Medications   methocarbamol (ROBAXIN) 500 MG tablet    Sig: Take 1 tablet (500 mg total) by mouth every 6 (six) hours as needed for muscle spasms.    Dispense:  30 tablet    Refill:  0     Procedures: No procedures performed  Clinical Data: No additional findings.  ROS:  All other systems negative, except as noted in the HPI. Review of Systems  Objective: Vital Signs: There were no vitals taken for this visit.  Specialty Comments:  No specialty comments available.  PMFS History: Patient Active Problem List   Diagnosis Date Noted   Pyogenic inflammation of bone (HCC) 06/02/2022   PICC  (peripherally inserted central catheter) in place 06/02/2022   Medication management 06/02/2022   Diabetic foot infection (HCC) 06/02/2022   Smoking 06/02/2022   Tobacco abuse 05/06/2022   Hyperkalemia 05/06/2022   Type 2 diabetes mellitus (HCC) 05/05/2022   Obesity (BMI 30-39.9) 05/05/2022   Cellulitis and abscess of foot    AKI (acute kidney injury) (HCC) 05/04/2022   Injury of left foot    Diabetic foot ulcer (HCC) 12/14/2021   Past Medical History:  Diagnosis Date   Diabetes mellitus without complication (HCC)     History reviewed. No pertinent family history.  Past Surgical History:  Procedure Laterality Date   I & D EXTREMITY Left 05/05/2022   Procedure: LEFT FOOT DEBRIDEMENT;  Surgeon: Nadara Mustard, MD;  Location: [facility] OR;  Service: Orthopedics;  Laterality: Left;   TONSILLECTOMY     Social History   Occupational History   Not on file  Tobacco Use   Smoking status: Every Day    Packs/day: 0.25    Types: Cigarettes   Smokeless tobacco: Never   Tobacco comments:    Cutting back - goes through a pack in 4-6 days  Vaping Use   Vaping Use: Never used  Substance and Sexual Activity   Alcohol use: No   Drug use: No   Sexual activity: Yes

## 2022-07-13 ENCOUNTER — Other Ambulatory Visit: Payer: Self-pay

## 2022-07-13 ENCOUNTER — Encounter: Payer: 59 | Admitting: Physician Assistant

## 2022-07-13 DIAGNOSIS — L97522 Non-pressure chronic ulcer of other part of left foot with fat layer exposed: Secondary | ICD-10-CM | POA: Diagnosis not present

## 2022-07-13 DIAGNOSIS — E11621 Type 2 diabetes mellitus with foot ulcer: Secondary | ICD-10-CM | POA: Diagnosis not present

## 2022-07-13 NOTE — Progress Notes (Addendum)
ELMOR, KOST ([id number]) Visit Report for 07/13/2022 Chief Complaint Document Details Patient Name: [name], [name] Date of Service: 07/13/2022 11:00 AM Medical Record Number: [id number] Patient Account Number: [id number] Date of Birth/Sex: 1966/04/07 (56 y.o. 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 M) Treating RN: Yevonne Pax Primary Care Provider: PATIENT, NO Other Clinician: Referring Provider: Duanne Guess Treating Provider/Extender: Rowan Blase in Treatment: 4 Debridement Performed for Wound #1 Left,Plantar Foot Assessment: Performed By: Physician Nelida Meuse., PA-C Debridement Type: Debridement Severity of Tissue Pre Debridement: Fat layer exposed Level of Consciousness (Pre- Awake and Alert procedure): Pre-procedure Verification/Time Out Yes - 11:40 Taken: Start Time: 11:40 Total Area Debrided (L x W): 6 (cm) x 6 (cm) = 36 (cm) Tissue and other material Viable, Non-Viable, Slough, Subcutaneous, Skin: Dermis , Skin: Epidermis, Biofilm, Slough debrided: Level: Skin/Subcutaneous Tissue Debridement Description: Excisional Instrument: Curette Bleeding: Minimum Hemostasis Achieved: Pressure End Time: 11:45 Procedural Pain: 0 Post Procedural Pain: 0 Response to Treatment: Procedure  was tolerated well Level of Consciousness (Post- Awake and Alert procedure): Post Debridement Measurements of Total Wound Length: (cm) 12.5 Width: (cm) 7.5 Depth: (cm) 0.4 Volume: (cm) 29.452 Character of Wound/Ulcer Post Debridement: Improved Severity of Tissue Post Debridement: Fat layer exposed Post Procedure Diagnosis Same as Pre-procedure Electronic Signature(s) Signed: 07/13/2022 12:35:42 PM By: Yevonne Pax RN Signed: 07/14/2022 5:04:29 PM By: Lenda Kelp PA-C Entered By: Yevonne Pax on 07/13/2022 11:44:46 [name] ([id number]) -------------------------------------------------------------------------------- HPI Details Patient Name: [name] Date of Service: 07/13/2022 11:00 AM Medical Record Number: [id number] Patient Account Number: [id number] Date of Birth/Sex: 1966-07-29 (56 y.o. M) Treating RN: Yevonne Pax Primary Care Provider: PATIENT, NO Other Clinician: Referring Provider: Duanne Guess Treating Provider/Extender: Rowan Blase in Treatment: 4 History of Present Illness HPI Description: Notes from [facility] under Dr. America Brown care:   ADMISSION6/15/2023This is a 56 year old poorly controlled type II diabetic (last A1c 11.5%). In January of this year, he presented to the hospital with an abscess in his left foot. He also had a foreign body present. It was removed in the ER and he was admitted to the hospital for IV antibiotics. He was subsequently discharged on oral antibiotics. Due to financial concerns, he has not taken his diabetic medications (metformin and 70/30 insulin) and as a result, presented back to the emergency room on June 1 with worsening findings, including frank pus and osteomyelitis on MRI. BKA was recommended, but he declined. He was taken to the operating room by Dr. Lajoyce Corners who performed an extensive debridement. Cultures were taken and he was placed on IV antibiotics. He saw infectious disease while in the  hospital who prescribed a 6-week course of IV antibiotics including cefazolin as well as oral metronidazole and levofloxacin. The infectious disease provider also indicated that he would benefit from below-knee amputation, but the patient desired another opinion and therefore he has been referred to wound care center for further evaluation and management. ABIs obtained while he was in the hospital demonstrate adequate blood flow. Essentially the entire  bottom of the patient's left foot has been removed. The heads of the majority of his metatarsal bones are exposed along with their accompanying tendons. Muscle and fat are exposed as well. The wound surface is fairly dry; the patient reports that he was not given any specific wound care instructions other than to place a dry dressing on it after showering. There is no odor or purulent drainage identified. 05/26/2022: The wound measures a little bit smaller today. There is some good granulation tissue beginning to form on the surface. He still has exposed bone and tendon. There is some slough accumulation. He saw infectious disease earlier this week and was frustrated that they recommended amputation. He is interested in another surgical opinion. He continues to smoke but says that he has cut down. He is using a knee scooter for mobility so that he can stay off of his foot. 06/05/2022: He saw Dr. Lajoyce Corners last week who was supportive of our efforts to try to salvage the foot. I referred him to vascular surgery to see Dr. Chestine Spore but he has not received an appointment yet. He continues on IV antibiotics. He says that after our conversation last week about him being unable to initiate hyperbarics if he was going to continue to smoke, he has not had a cigarette since. He is now at 4 weeks of conventional management and his insurance was initiated on July 1. Today, the wound continues to show evidence of improvement. There is good granulation tissue forming. The  metatarsals are still exposed but actually have some pink periosteum. There is still some slough and fibrinous exudate present. No odor or purulent drainage. We have just been using Dakin's dressing changes for now. Admission to Wound Care Center at [facility]: 06-15-2022 upon evaluation today patient presents for initial inspection here in our clinic though he has been seen by Dr. Lady Gary in [place] up to this point. I did review her notes and records as well they have been using Dakin's moistened gauze dressing which has done a great job at helping to clean out the bottom of his foot. Please see the discourse above for additional information in regard to treatment course up until the patient arrives here in the [facility] clinic today. The patient is ready to get started with HBO therapy as soon as possible I do think based on all my review of his records this seems to be appropriate he does have chronic osteomyelitis is also been offered amputation this is obviously a limb salvage operation here. We are trying to prevent him from having to undergo an extensive amputation which would likely be a below-knee amputation which in turn is going to affect his quality of life he wants to try to avoid that if at all possible I think hyperbaric oxygen therapy is his best bet to achieve that goal. Currently he has been on IV Ancef. That actually ends tomorrow. Following that according to notes it appears he supposed to be taking Levaquin and Flagyl for an additional period of time although I am not certain exactly how long that with the. He has been seeing regional physicians infectious disease in [place] and again that so I recommended that he contact today in order to find out what the plan is going forward. His most recent hemoglobin A1c as noted above was 11.5 on 05-07-2022 he tells me trying to work on that he is also quit smoking as of this point. 06-22-2022 upon evaluation today patient appears to be doing  well  currently in regard to his wound all things considered. He is showing signs of a lot of new granulation tissue and overall very pleased. He is changing this once a day with the Dakin's moistened gauze dressing. Fortunately I do not see any evidence of infection locally or systemically at this time. We have been trying to get him into the hyperbaric chamber he was having a lot of issues with sinus pressure and we have made referral and called multiple ENT specialist the earliest we have been able to get his August 9 at [facility] ear nose throat. They are going to try to work him in sooner if they have any opportunities to do so. Obviously I think the sooner he can do this the better. We need to get him in hyperbarics as quickly as possible. 7/26; patient presents for follow-up. He has been using Dakin's wet-to-dry dressings and oil emollient dressing over the exposed bone. He currently denies systemic signs of infection. He states he is scheduled to see infectious disease, Dr Thedore Mins next week. She had ordered an MRI of the left foot and he is scheduled to have this done tomorrow. He currently denies systemic signs of infection Including fever/chills, nausea/vomiting increased warmth or erythema to the wound bed or purulent drainage. He started hyperbaric oxygen treatment however did not tolerate this due to sinus pressure. He is scheduled to see ENT in 2 weeks for evaluation. He is not continuing HBO until he is cleared by ENT. 07-06-2022 patient presents today for follow-up concerning his plantar foot ulcer. The good news is this actually is significantly better compared to previous. Some of the bone which is necrotic is starting to slough off and I am going to perform debridement to clear this away today. Nonetheless I do think that the patient is going to need hyperbarics and we need to try to get it as soon as possible. He still having a lot of sinus pressure I can even hear the congestion today.  He is on 3 different antibiotics cefadroxil, metronidazole, and Levaquin currently for the osteomyelitis. For that reason this should actually help with the infection if he does have a chronic sinusitis I am also going to see about getting him on steroids to try to see if this can benefit him as well. He voiced understanding and he is in agreement with giving that shot. This is probably can to spike his blood sugars which I discussed with him he is going to be seeing his primary care provider later today I want him to let her know HURRELL, Daequan ([id number]) as well what is going on and why so that she could be aware but I really do think he needs to take the prednisone. We need to get him in the chamber as soon as possible to try to help with getting this wound to heal and closed so that he does not lose his foot. 07-13-2022 upon evaluation today patient appears to be doing better in regard to his wound he continues to show signs of new granulation am going to perform some debridement today but overall this is doing quite well. Electronic Signature(s) Signed: 07/13/2022 11:52:09 AM By: Lenda Kelp PA-C Entered By: Lenda Kelp on 07/13/2022 11:52:09 [name], [name] ([id number]) -------------------------------------------------------------------------------- Physical Exam Details Patient Name: [name] Date of Service: 07/13/2022 11:00 AM Medical Record Number: [id number] Patient Account Number: [id number] Date of Birth/Sex: [birth year] (55 y.o. M) Treating RN: Yevonne Pax Primary Care Provider: PATIENT, NO Other Clinician: Referring Provider:  Duanne Guessannon, Jennifer Treating Provider/Extender: Allen DerryStone, Marwan Lipe Weeks in Treatment: 4 Constitutional Well-nourished and well-hydrated in no acute distress. Respiratory normal breathing without difficulty. Psychiatric this patient is able to make decisions and demonstrates good insight into disease process. Alert and Oriented x 3. pleasant and  cooperative. Notes Upon inspection patient's wound bed actually showed signs of good granulation epithelization at this time. Fortunately there does not appear to be any evidence of infection which is great news and overall I am extremely pleased with where things stand today. Electronic Signature(s) Signed: 07/13/2022 11:52:23 AM By: Lenda KelpStone III, Cherity Blickenstaff PA-C Entered By: Lenda KelpStone III, Krystan Northrop on 07/13/2022 11:52:23 [name], [name] ([id number]) -------------------------------------------------------------------------------- Physician Orders Details Patient Name: [name], [name] Date of Service: 07/13/2022 11:00 AM Medical Record Number: [id number] Patient Account Number: [id number] Date of Birth/Sex: 10-22-1966 (56 y.o. M) Treating RN: Yevonne PaxEpps, Carrie Primary Care Provider: PATIENT, NO Other Clinician: Referring Provider: Duanne Guessannon, Jennifer Treating Provider/Extender: Rowan BlaseStone, Tuleen Mandelbaum Weeks in Treatment: 4 Verbal / Phone Orders: No Diagnosis Coding Follow-up Appointments o Return Appointment in 1 week. Bathing/ Shower/ Hygiene o May shower; gently cleanse wound with antibacterial soap, rinse and pat dry prior to dressing wounds Anesthetic (Use 'Patient Medications' Section for Anesthetic Order Entry) o Lidocaine applied to wound bed Edema Control - Lymphedema / Segmental Compressive Device / Other o Elevate, Exercise Daily and Avoid Standing for Long Periods of Time. o Elevate legs to the level of the heart and pump ankles as often as possible o Elevate leg(s) parallel to the floor when sitting. Hyperbaric Oxygen Therapy o Evaluate for HBO Therapy o Indication and location: - osteomyelitis left foot o 2.5 ATA for 90 Minutes with 2 Five (5) Minute Air Breaks - total 40 treatments o Total # of Treatments: - 40 o Antihistamine 30 minutes prior to HBO Treatment, difficulty clearing ears. o Finger stick Blood Glucose Pre- and Post- HBOT Treatment. o Follow Hyperbaric Oxygen Glycemia  Protocol Wound Treatment Wound #1 - Foot Wound Laterality: Plantar, Left Cleanser: Dakin 16 (oz) 0.25 1 x Per Day/30 Days Discharge Instructions: Use as directed. Primary Dressing: Gauze (Generic) 1 x Per Day/30 Days Discharge Instructions: moistened with 1/4 Dakins Solution Secondary Dressing: ABD Pad 5x9 (in/in) (Generic) 1 x Per Day/30 Days Discharge Instructions: Cover with ABD pad Secondary Dressing: Kerlix 4.5 x 4.1 (in/yd) (Generic) 1 x Per Day/30 Days Discharge Instructions: Apply Kerlix 4.5 x 4.1 (in/yd) as instructed Secured With: Medipore Tape - 44M Medipore H Soft Cloth Surgical Tape, 2x2 (in/yd) (Generic) 1 x Per Day/30 Days GLYCEMIA INTERVENTIONS PROTOCOL PRE-HBO GLYCEMIA INTERVENTIONS ACTION INTERVENTION Obtain pre-HBO capillary blood glucose (ensure 1 physician order is in chart). A. Notify HBO physician and await physician orders. 2 If result is 70 mg/dl or below: B. If the result meets the hospital definition of a critical result, follow hospital policy. If result is 71 mg/dl to 782130 mg/dl: A. Give patient an 8 ounce Glucerna Shake, an 8 ounce Ensure, or 8 ounces of a Glucerna/Ensure equivalent dietary supplement*. B. Wait 30 minutes. C. Retest patientos capillary blood glucose (CBG). [name], [name] ([id number]) D. If result greater than or equal to 110 mg/dl, proceed with HBO. If result less than 110 mg/dl, notify HBO physician and consider holding HBO. If result is 131 mg/dl to 578249 mg/dl: A. Proceed with HBO. A. Notify HBO physician and await physician orders. B. It is recommended to hold HBO and do If result is 250 mg/dl or greater: blood/urine ketone testing. C. If the result meets the hospital definition of a critical  result, follow hospital policy. POST-HBO GLYCEMIA INTERVENTIONS ACTION INTERVENTION Obtain post HBO capillary blood glucose (ensure 1 physician order is in chart). A. Notify HBO physician and await physician orders. 2 If result is  70 mg/dl or below: B. If the result meets the hospital definition of a critical result, follow hospital policy. A. Give patient an 8 ounce Glucerna Shake, an 8 ounce Ensure, or 8 ounces of a Glucerna/Ensure equivalent dietary supplement*. B. Wait 15 minutes for symptoms of hypoglycemia (i.e. nervousness, anxiety, If result is 71 mg/dl to 147 mg/dl: sweating, chills, clamminess, irritability, confusion, tachycardia or dizziness). C. If patient asymptomatic, discharge patient. If patient symptomatic, repeat capillary blood glucose (CBG) and notify HBO physician. If result is 101 mg/dl to 829 mg/dl: A. Discharge patient. A. Notify HBO physician and await physician orders. B. It is recommended to do blood/urine If result is 250 mg/dl or greater: ketone testing. C. If the result meets the hospital definition of a critical result, follow hospital policy. *Juice or candies are NOT equivalent products. If patient refuses the Glucerna or Ensure, please consult the hospital dietitian for an appropriate substitute. Electronic Signature(s) Signed: 07/13/2022 12:35:42 PM By: Yevonne Pax RN Signed: 07/14/2022 5:04:29 PM By: Lenda Kelp PA-C Entered By: Yevonne Pax on 07/13/2022 11:45:10 [name] ([id number]) -------------------------------------------------------------------------------- Problem List Details Patient Name: [name] Date of Service: 07/13/2022 11:00 AM Medical Record Number: [id number] Patient Account Number: [id number] Date of Birth/Sex: 05/23/1966 (56 y.o. M) Treating RN: Yevonne Pax Primary Care Provider: PATIENT, NO Other Clinician: Referring Provider: Duanne Guess Treating Provider/Extender: Rowan Blase in Treatment: 4 Active Problems ICD-10 Encounter Code Description Active Date MDM Diagnosis [phone] Other chronic osteomyelitis, left ankle and foot 06/15/2022 No Yes E11.621 Type 2 diabetes mellitus with foot ulcer 06/15/2022 No Yes L97.524  Non-pressure chronic ulcer of other part of left foot with necrosis of 06/15/2022 No Yes bone Inactive Problems Resolved Problems Electronic Signature(s) Signed: 07/13/2022 11:39:28 AM By: Lenda Kelp PA-C Entered By: Lenda Kelp on 07/13/2022 11:39:28 [name] ([id number]) -------------------------------------------------------------------------------- Progress Note Details Patient Name: [name] Date of Service: 07/13/2022 11:00 AM Medical Record Number: [id number] Patient Account Number: [id number] Date of Birth/Sex: 1966/11/22 (56 y.o. 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 America Brown care:   ADMISSION6/15/2023This is a 56 year old poorly controlled type II diabetic (last A1c 11.5%). In January of this year, he presented to the hospital with an abscess in his left foot. He also had a foreign body present. It was removed in the ER and he was admitted to the hospital for IV antibiotics. He was subsequently discharged on oral antibiotics. Due to financial concerns, he has not taken his diabetic medications (metformin and 70/30 insulin) and as a result, presented back to the emergency room on June 1 with worsening findings, including frank pus and osteomyelitis on MRI. BKA was recommended, but he declined. He was taken to the operating room by Dr. Lajoyce Corners who performed an extensive debridement. Cultures were taken and he was placed on IV antibiotics. He saw infectious disease while in the hospital who prescribed a 6-week course of IV antibiotics including cefazolin as well as oral metronidazole and levofloxacin. The infectious  disease provider also indicated that he would benefit  from below-knee amputation, but the patient desired another opinion and therefore he has been referred to wound care center for further evaluation and management. ABIs obtained while he was in the hospital demonstrate adequate blood flow. Essentially the entire bottom of the patient's left foot has been removed. The heads of the majority of his metatarsal bones are exposed along with their accompanying tendons. Muscle and fat are exposed as well. The wound surface is fairly dry; the patient reports that he was not given any specific wound care instructions other than to place a dry dressing on it after showering. There is no odor or purulent drainage identified. 05/26/2022: The wound measures a little bit smaller today. There is some good granulation tissue beginning to form on the surface. He still has exposed bone and tendon. There is some slough accumulation. He saw infectious disease earlier this week and was frustrated that they recommended amputation. He is interested in another surgical opinion. He continues to smoke but says that he has cut down. He is using a knee scooter for mobility so that he can stay off of his foot. 06/05/2022: He saw Dr. Lajoyce Corners last week who was supportive of our efforts to try to salvage the foot. I referred him to vascular surgery to see Dr. Chestine Spore but he has not received an appointment yet. He continues on IV antibiotics. He says that after our conversation last week about him being unable to initiate hyperbarics if he was going to continue to smoke, he has not had a cigarette since. He is now at 4 weeks of conventional management and his insurance was initiated on July 1. Today, the wound continues to show evidence of improvement. There is good granulation tissue forming. The metatarsals are still exposed but actually have some pink periosteum. There is still some slough and fibrinous exudate present. No odor or  purulent drainage. We have just been using Dakin's dressing changes for now. Admission to Wound Care Center at [facility]: 06-15-2022 upon evaluation today patient presents for initial inspection here in our clinic though he has been seen by Dr. Lady Gary in [place] up to this point. I did review her notes and records as well they have been using Dakin's moistened gauze dressing which has done a great job at helping to clean out the bottom of his foot. Please see the discourse above for additional information in regard to treatment course up until the patient arrives here in the [place] clinic today. The patient is ready to get started with HBO therapy as soon as possible I do think based on all my review of his records this seems to be appropriate he does have chronic osteomyelitis is also been offered amputation this is obviously a limb salvage operation here. We are trying to prevent him from having to undergo an extensive amputation which would likely be a below-knee amputation which in turn is going to affect his quality of life he wants to try to avoid that if at all possible I think hyperbaric oxygen therapy is his best bet to achieve that goal. Currently he has been on IV Ancef. That actually ends tomorrow. Following that according to notes it appears he supposed to be taking Levaquin and Flagyl for an additional period of time although I am not certain exactly how long that with the. He has been seeing regional physicians infectious disease in [place] and again that so I recommended that he contact today in order to find out what the plan is going forward. His  most recent hemoglobin A1c as noted above was 11.5 on 05-07-2022 he tells me trying to work on that he is also quit smoking as of this point. 06-22-2022 upon evaluation today patient appears to be doing well currently in regard to his wound all things considered. He is showing signs of a lot of new granulation tissue and overall very  pleased. He is changing this once a day with the Dakin's moistened gauze dressing. Fortunately I do not see any evidence of infection locally or systemically at this time. We have been trying to get him into the hyperbaric chamber he was having a lot of issues with sinus pressure and we have made referral and called multiple ENT specialist the earliest we have been able to get his August 9 at [facility] ear nose throat. They are going to try to work him in sooner if they have any opportunities to do so. Obviously I think the sooner he can do this the better. We need to get him in hyperbarics as quickly as possible. 7/26; patient presents for follow-up. He has been using Dakin's wet-to-dry dressings and oil emollient dressing over the exposed bone. He currently denies systemic signs of infection. He states he is scheduled to see infectious disease, Dr Thedore Mins next week. She had ordered an MRI of the left foot and he is scheduled to have this done tomorrow. He currently denies systemic signs of infection Including fever/chills, nausea/vomiting increased warmth or erythema to the wound bed or purulent drainage. He started hyperbaric oxygen treatment however did not tolerate this due to sinus pressure. He is scheduled to see ENT in 2 weeks for evaluation. He is not continuing HBO until he is cleared by ENT. 07-06-2022 patient presents today for follow-up concerning his plantar foot ulcer. The good news is this actually is significantly better compared to previous. Some of the bone which is necrotic is starting to slough off and I am going to perform debridement to clear this away today. [name], [name] ([id number]) Nonetheless I do think that the patient is going to need hyperbarics and we need to try to get it as soon as possible. He still having a lot of sinus pressure I can even hear the congestion today. He is on 3 different antibiotics cefadroxil, metronidazole, and Levaquin currently for the osteomyelitis. For  that reason this should actually help with the infection if he does have a chronic sinusitis I am also going to see about getting him on steroids to try to see if this can benefit him as well. He voiced understanding and he is in agreement with giving that shot. This is probably can to spike his blood sugars which I discussed with him he is going to be seeing his primary care provider later today I want him to let her know as well what is going on and why so that she could be aware but I really do think he needs to take the prednisone. We need to get him in the chamber as soon as possible to try to help with getting this wound to heal and closed so that he does not lose his foot. 07-13-2022 upon evaluation today patient appears to be doing better in regard to his wound he continues to show signs of new granulation am going to perform some debridement today but overall this is doing quite well. Objective Constitutional Well-nourished and well-hydrated in no acute distress. Vitals Time Taken: 11:36 AM, Height: 70 in, Weight: 230 lbs, BMI: 33, Temperature: 98.1 F,  Pulse: 88 bpm, Respiratory Rate: 18 breaths/min, Blood Pressure: 156/91 mmHg. Respiratory normal breathing without difficulty. Psychiatric this patient is able to make decisions and demonstrates good insight into disease process. Alert and Oriented x 3. pleasant and cooperative. General Notes: Upon inspection patient's wound bed actually showed signs of good granulation epithelization at this time. Fortunately there does not appear to be any evidence of infection which is great news and overall I am extremely pleased with where things stand today. Integumentary (Hair, Skin) Wound #1 status is Open. Original cause of wound was Gradually Appeared. The date acquired was: 12/04/2021. The wound has been in treatment 4 weeks. The wound is located on the Left,Plantar Foot. The wound measures 12.5cm length x 7.5cm width x 0.4cm depth; 73.631cm^2  area and 29.452cm^3 volume. There is bone, muscle, tendon, Fat Layer (Subcutaneous Tissue), and fascia exposed. There is no tunneling or undermining noted. There is a medium amount of serosanguineous drainage noted. There is large (67-100%) pink, pale, hyper - granulation within the wound bed. There is a small (1-33%) amount of necrotic tissue within the wound bed including Adherent Slough and Necrosis of Muscle. Assessment Active Problems ICD-10 Other chronic osteomyelitis, left ankle and foot Type 2 diabetes mellitus with foot ulcer Non-pressure chronic ulcer of other part of left foot with necrosis of bone Procedures Wound #1 Pre-procedure diagnosis of Wound #1 is a Diabetic Wound/Ulcer of the Lower Extremity located on the Left,Plantar Foot .Severity of Tissue Pre Debridement is: Fat layer exposed. There was a Excisional Skin/Subcutaneous Tissue Debridement with a total area of 36 sq cm performed by Nelida Meuse., PA-C. With the following instrument(s): Curette to remove Viable and Non-Viable tissue/material. Material removed includes Subcutaneous Tissue, Slough, Skin: Dermis, Skin: Epidermis, and Biofilm. No specimens were taken. A time out was conducted at 11:40, prior to the start of the procedure. A Minimum amount of bleeding was controlled with Pressure. The procedure was tolerated well with a pain level of 0 throughout and a pain level of 0 following the procedure. Post Debridement Measurements: 12.5cm length x 7.5cm width x 0.4cm depth; 29.452cm^3 volume. Character of Wound/Ulcer Post Debridement is improved. Severity of Tissue Post Debridement is: Fat layer exposed. Post procedure Diagnosis Wound #1: Same as Pre-Procedure [name], [name] ([id number]) Plan Follow-up Appointments: Return Appointment in 1 week. Bathing/ Shower/ Hygiene: May shower; gently cleanse wound with antibacterial soap, rinse and pat dry prior to dressing wounds Anesthetic (Use 'Patient Medications' Section for  Anesthetic Order Entry): Lidocaine applied to wound bed Edema Control - Lymphedema / Segmental Compressive Device / Other: Elevate, Exercise Daily and Avoid Standing for Long Periods of Time. Elevate legs to the level of the heart and pump ankles as often as possible Elevate leg(s) parallel to the floor when sitting. Hyperbaric Oxygen Therapy: Evaluate for HBO Therapy Indication and location: - osteomyelitis left foot 2.5 ATA for 90 Minutes with 2 Five (5) Minute Air Breaks - total 40 treatments Total # of Treatments: - 40 Antihistamine 30 minutes prior to HBO Treatment, difficulty clearing ears. Finger stick Blood Glucose Pre- and Post- HBOT Treatment. Follow Hyperbaric Oxygen Glycemia Protocol WOUND #1: - Foot Wound Laterality: Plantar, Left Cleanser: Dakin 16 (oz) 0.25 1 x Per Day/30 Days Discharge Instructions: Use as directed. Primary Dressing: Gauze (Generic) 1 x Per Day/30 Days Discharge Instructions: moistened with 1/4 Dakins Solution Secondary Dressing: ABD Pad 5x9 (in/in) (Generic) 1 x Per Day/30 Days Discharge Instructions: Cover with ABD pad Secondary Dressing: Kerlix 4.5 x 4.1 (in/yd) (Generic)  1 x Per Day/30 Days Discharge Instructions: Apply Kerlix 4.5 x 4.1 (in/yd) as instructed Secured With: Medipore Tape - 58M Medipore H Soft Cloth Surgical Tape, 2x2 (in/yd) (Generic) 1 x Per Day/30 Days 1. I would recommend currently that we going continue with the wound care measures as before and the patient is in agreement with plan. This includes the use of the Dakin's moistened gauze dressing which I think is really doing quite well. 2. I am also can recommend that we have the patient continue to monitor for any signs of infection or worsening if anything changes he should let me know I do believe the antibiotics are likely helping and based on what I am seeing we are doing our best to get him in hyperbarics although he is still pending being seen at the ear nose throat specialist  specialist. This is actually now scheduled for Monday as when he went yesterday did not have his insurance card. We will see patient back for reevaluation in 1 week here in the clinic. If anything worsens or changes patient will contact our office for additional recommendations. Electronic Signature(s) Signed: 07/13/2022 11:54:39 AM By: Lenda Kelp PA-C Entered By: Lenda Kelp on 07/13/2022 11:54:39 [name] ([id number]) -------------------------------------------------------------------------------- SuperBill Details Patient Name: [name] Date of Service: 07/13/2022 Medical Record Number: [id number] Patient Account Number: [id number] Date of Birth/Sex: March 30, 1966 (56 y.o. M) Treating RN: Yevonne Pax Primary Care Provider: PATIENT, NO Other Clinician: Referring Provider: Duanne Guess Treating Provider/Extender: Rowan Blase in Treatment: 4 Diagnosis Coding ICD-10 Codes Code Description [phone] Other chronic osteomyelitis, left ankle and foot E11.621 Type 2 diabetes mellitus with foot ulcer L97.524 Non-pressure chronic ulcer of other part of left foot with necrosis of bone Facility Procedures CPT4 Code: [id number] Description: 11042 - DEB SUBQ TISSUE 20 SQ CM/< Modifier: Quantity: 1 CPT4 Code: Description: ICD-10 Diagnosis Description L97.524 Non-pressure chronic ulcer of other part of left foot with necrosis of b Modifier: one Quantity: CPT4 Code: [id number] Description: 11045 - DEB SUBQ TISS EA ADDL 20CM Modifier: Quantity: 1 CPT4 Code: Description: ICD-10 Diagnosis Description L97.524 Non-pressure chronic ulcer of other part of left foot with necrosis of b Modifier: one Quantity: Physician Procedures CPT4 Code: 2440102 Description: 11042 - WC PHYS SUBQ TISS 20 SQ CM Modifier: Quantity: 1 CPT4 Code: Description: ICD-10 Diagnosis Description L97.524 Non-pressure chronic ulcer of other part of left foot with necrosis of bo Modifier: ne Quantity: CPT4  Code: [id number] Description: 11045 - WC PHYS SUBQ TISS EA ADDL 20 CM Modifier: Quantity: 1 CPT4 Code: Description: ICD-10 Diagnosis Description L97.524 Non-pressure chronic ulcer of other part of left foot with necrosis of bo Modifier: ne Quantity: Electronic Signature(s) Signed: 07/13/2022 11:55:30 AM By: Lenda Kelp PA-C Entered By: Lenda Kelp on 07/13/2022 11:55:30

## 2022-07-13 NOTE — Progress Notes (Signed)
[name], [name] ([id number]) Visit Report for 07/13/2022 Arrival Information Details Patient Name: [name], [name] Date of Service: 07/13/2022 11:00 AM Medical Record Number: [id number] Patient Account Number: [id number] Date of Birth/Sex: [birth year] (56 y.o. M) Treating RN: Yevonne Pax Primary Care Garrus Gauthreaux: PATIENT, NO Other Clinician: Referring Sueko Dimichele: Duanne Guess Treating Avrom Robarts/Extender: Rowan Blase in Treatment: 4 Visit Information History Since Last Visit All ordered tests and consults were completed: No Patient Arrived: Ambulatory Added or deleted any medications: No Arrival Time: 11:33 Any new allergies or adverse reactions: No Accompanied By: self Had a fall or experienced change in No Transfer Assistance: None activities of daily living that may affect Patient Identification Verified: Yes risk of falls: Secondary Verification Process Completed: Yes Signs or symptoms of abuse/neglect since last visito No Patient Requires Transmission-Based Precautions: No Hospitalized since last visit: No Patient Has Alerts: No Implantable device outside of the clinic excluding No cellular tissue based products placed in the center since last visit: Has Dressing in Place as Prescribed: Yes Pain Present Now: No Electronic Signature(s) Signed: 07/13/2022 12:35:42 PM By: Yevonne Pax RN Entered By: Yevonne Pax on 07/13/2022 11:36:24 [name] ([id number]) -------------------------------------------------------------------------------- Clinic Level of Care Assessment Details Patient Name: [name] Date of Service: 07/13/2022 11:00 AM Medical Record Number: [id number] Patient Account Number: [id number] Date of Birth/Sex: [birth year] (56 y.o. M) Treating RN: Yevonne Pax Primary Care Masiah Lewing: PATIENT, NO Other Clinician: Referring Orell Hurtado: Duanne Guess Treating Iola Turri/Extender: Rowan Blase in Treatment: 4 Clinic Level of Care Assessment Items TOOL 1 Quantity  Score []  - Use when EandM and Procedure is performed on INITIAL visit 0 ASSESSMENTS - Nursing Assessment / Reassessment []  - General Physical Exam (combine w/ comprehensive assessment (listed just below) when performed on new 0 pt. evals) []  - 0 Comprehensive Assessment (HX, ROS, Risk Assessments, Wounds Hx, etc.) ASSESSMENTS - Wound and Skin Assessment / Reassessment []  - Dermatologic / Skin Assessment (not related to wound area) 0 ASSESSMENTS - Ostomy and/or Continence Assessment and Care []  - Incontinence Assessment and Management 0 []  - 0 Ostomy Care Assessment and Management (repouching, etc.) PROCESS - Coordination of Care []  - Simple Patient / Family Education for ongoing care 0 []  - 0 Complex (extensive) Patient / Family Education for ongoing care []  - 0 Staff obtains , Records, Test Results / Process Orders []  - 0 Staff telephones HHA, Nursing Homes / Clarify orders / etc []  - 0 Routine Transfer to another Facility (non-emergent condition) []  - 0 Routine Hospital Admission (non-emergent condition) []  - 0 New Admissions / / Ordering NPWT, Apligraf, etc. []  - 0 Emergency Hospital Admission (emergent condition) PROCESS - Special Needs []  - Pediatric / Minor Patient Management 0 []  - 0 Isolation Patient Management []  - 0 Hearing / Language / Visual special needs []  - 0 Assessment of Community assistance (transportation, D/C planning, etc.) []  - 0 Additional assistance / Altered mentation []  - 0 Support Surface(s) Assessment (bed, cushion, seat, etc.) INTERVENTIONS - Miscellaneous []  - External ear exam 0 []  - 0 Patient Transfer (multiple staff / / Similar devices) []  - 0 Simple Staple / Suture removal (25 or less) []  - 0 Complex Staple / Suture removal (26 or more) []  - 0 Hypo/Hyperglycemic Management (do not check if billed separately) []  - 0 Ankle / Brachial Index (ABI) - do not check if billed separately Has the  patient been seen at the hospital within the last three years: Yes Total Score: 0 Level Of Care: ____  [name] ([id number]) Electronic Signature(s) Signed: 07/13/2022 12:35:42 PM By: Carlene Coria RN Entered By: Carlene Coria on 07/13/2022 11:45:16 [name] ([id number]) -------------------------------------------------------------------------------- Encounter Discharge Information Details Patient Name: [name] Date of Service: 07/13/2022 11:00 AM Medical Record Number: [id number] Patient Account Number: [id number] Date of Birth/Sex: 1966/10/28 (56 y.o. M) Treating RN: Carlene Coria Primary Care Derrico Zhong: PATIENT, NO Other Clinician: Referring Breckan Cafiero: Fredirick Maudlin Treating Fortune Torosian/Extender: Skipper Cliche in Treatment: 4 Encounter Discharge Information Items Post Procedure Vitals Discharge Condition: Stable Temperature (F): 98.1 Ambulatory Status: Ambulatory Pulse (bpm): 88 Discharge Destination: Home Respiratory Rate (breaths/min): 18 Transportation: Private Auto Blood Pressure (mmHg): 156/91 Accompanied By: self Schedule Follow-up Appointment: Yes Clinical Summary of Care: Electronic Signature(s) Signed: 07/13/2022 12:35:42 PM By: Carlene Coria RN Entered By: Carlene Coria on 07/13/2022 11:46:48 [name] ([id number]) -------------------------------------------------------------------------------- Lower Extremity Assessment Details Patient Name: [name] Date of Service: 07/13/2022 11:00 AM Medical Record Number: [id number] Patient Account Number: [id number] Date of Birth/Sex: Jun 01, 1966 (56 y.o. M) Treating RN: Carlene Coria Primary Care Recie Cirrincione: PATIENT, NO Other Clinician: Referring Sharonica Kraszewski: Fredirick Maudlin Treating Brandon Wiechman/Extender: Skipper Cliche in Treatment: 4 Edema Assessment Assessed: [Left: No] [Right: No] Edema: [Left: Ye] [Right: s] Calf Left: Right: Point of Measurement: 32 cm From Medial Instep 39 cm Ankle Left: Right: Point of  Measurement: 11 cm From Medial Instep 24 cm Vascular Assessment Pulses: Dorsalis Pedis Palpable: [Left:Yes] Electronic Signature(s) Signed: 07/13/2022 12:35:42 PM By: Carlene Coria RN Entered By: Carlene Coria on 07/13/2022 11:38:18 [name] ([id number]) -------------------------------------------------------------------------------- Multi Wound Chart Details Patient Name: [name] Date of Service: 07/13/2022 11:00 AM Medical Record Number: [id number] Patient Account Number: [id number] Date of Birth/Sex: [birth year] (56 y.o. M) Treating RN: Carlene Coria Primary Care Joshiah Traynham: PATIENT, NO Other Clinician: Referring Rocco Kerkhoff: Fredirick Maudlin Treating Sharen Youngren/Extender: Skipper Cliche in Treatment: 4 Vital Signs Height(in): 70 Pulse(bpm): 88 Weight(lbs): 230 Blood Pressure(mmHg): 156/91 Body Mass Index(BMI): 33 Temperature(F): 98.1 Respiratory Rate(breaths/min): 18 Photos: [N/A:N/A] Wound Location: Left, Plantar Foot N/A N/A Wounding Event: Gradually Appeared N/A N/A Primary Etiology: Diabetic Wound/Ulcer of the Lower N/A N/A Extremity Comorbid History: Type II Diabetes N/A N/A Date Acquired: 12/04/2021 N/A N/A Weeks of Treatment: 4 N/A N/A Wound Status: Open N/A N/A Wound Recurrence: No N/A N/A Pending Amputation on Yes N/A N/A Presentation: Measurements L x W x D (cm) 12.5x7.5x0.4 N/A N/A Area (cm) : 73.631 N/A N/A Volume (cm) : 29.452 N/A N/A % Reduction in Area: 26.80% N/A N/A % Reduction in Volume: 41.40% N/A N/A Classification: Grade 4 N/A N/A Exudate Amount: Medium N/A N/A Exudate Type: Serosanguineous N/A N/A Exudate Color: red, brown N/A N/A Granulation Amount: Large (67-100%) N/A N/A Granulation Quality: Pink, Pale, Hyper-granulation N/A N/A Necrotic Amount: Small (1-33%) N/A N/A Exposed Structures: Fascia: Yes N/A N/A Fat Layer (Subcutaneous Tissue): Yes Tendon: Yes Muscle: Yes Bone: Yes Joint: No Epithelialization: None N/A N/A Treatment  Notes Electronic Signature(s) Signed: 07/13/2022 12:35:42 PM By: Carlene Coria RN Entered By: Carlene Coria on 07/13/2022 11:38:53 [name] ([id number]) -------------------------------------------------------------------------------- Multi-Disciplinary Care Plan Details Patient Name: [name] Date of Service: 07/13/2022 11:00 AM Medical Record Number: [id number] Patient Account Number: [id number] Date of Birth/Sex: 12/09/1965 (56 y.o. M) Treating RN: Carlene Coria Primary Care Shravya Wickwire: PATIENT, NO Other Clinician: Referring Lois Ostrom: Fredirick Maudlin Treating Gottfried Standish/Extender: Skipper Cliche in Treatment: 4 Active Inactive Necrotic Tissue Nursing Diagnoses: Impaired tissue integrity related to necrotic/devitalized tissue Knowledge deficit related to management of necrotic/devitalized tissue Goals: Necrotic/devitalized tissue will be minimized in the wound bed Date Initiated: 06/28/2022  Target Resolution Date: 06/28/2022 Goal Status: Active Patient/caregiver will verbalize understanding of reason and process for debridement of necrotic tissue Date Initiated: 06/28/2022 Target Resolution Date: 06/28/2022 Goal Status: Active Interventions: Assess patient pain level pre-, during and post procedure and prior to discharge Provide education on necrotic tissue and debridement process Treatment Activities: Excisional debridement : 06/28/2022 Notes: Orientation to the Wound Care Program Nursing Diagnoses: Knowledge deficit related to the wound healing center program Goals: Patient/caregiver will verbalize understanding of the [place] Date Initiated: 06/28/2022 Target Resolution Date: 06/28/2022 Goal Status: Active Interventions: Provide education on orientation to the wound center Notes: Osteomyelitis Nursing Diagnoses: Infection: osteomyelitis Knowledge deficit related to disease process and management Potential for infection:  osteomyelitis Goals: Diagnostic evaluation for osteomyelitis completed as ordered Date Initiated: 06/28/2022 Target Resolution Date: 06/28/2022 Goal Status: Active Patient/caregiver will verbalize understanding of disease process and disease management Date Initiated: 06/28/2022 Target Resolution Date: 06/28/2022 Goal Status: Active Patient's osteomyelitis will resolve Date Initiated: 06/28/2022 Target Resolution Date: 06/28/2022 [name], [name] ([id number]) Goal Status: Active Signs and symptoms for osteomyelitis will be recognized and promptly addressed Date Initiated: 06/28/2022 Target Resolution Date: 06/28/2022 Goal Status: Active Interventions: Assess for signs and symptoms of osteomyelitis resolution every visit Provide education on osteomyelitis Screen for HBO Notes: Wound/Skin Impairment Nursing Diagnoses: Knowledge deficit related to ulceration/compromised skin integrity Goals: Patient/caregiver will verbalize understanding of skin care regimen Date Initiated: 06/15/2022 Target Resolution Date: 07/16/2022 Goal Status: Active Ulcer/skin breakdown will have a volume reduction of 30% by week 4 Date Initiated: 06/15/2022 Target Resolution Date: 07/16/2022 Goal Status: Active Ulcer/skin breakdown will have a volume reduction of 50% by week 8 Date Initiated: 06/15/2022 Target Resolution Date: 08/16/2022 Goal Status: Active Ulcer/skin breakdown will have a volume reduction of 80% by week 12 Date Initiated: 06/15/2022 Target Resolution Date: 09/15/2022 Goal Status: Active Ulcer/skin breakdown will heal within 14 weeks Date Initiated: 06/15/2022 Target Resolution Date: 10/16/2022 Goal Status: Active Interventions: Assess patient/caregiver ability to obtain necessary supplies Assess patient/caregiver ability to perform ulcer/skin care regimen upon admission and as needed Assess ulceration(s) every visit Notes: Electronic Signature(s) Signed: 07/13/2022 12:35:42 PM By: Carlene Coria  RN Entered By: Carlene Coria on 07/13/2022 11:38:28 [name] ([id number]) -------------------------------------------------------------------------------- Pain Assessment Details Patient Name: [name] Date of Service: 07/13/2022 11:00 AM Medical Record Number: [id number] Patient Account Number: [id number] Date of Birth/Sex: 1966-06-10 (56 y.o. M) Treating RN: Carlene Coria Primary Care Delno Blaisdell: PATIENT, NO Other Clinician: Referring Alassane Kalafut: Fredirick Maudlin Treating Kushi Kun/Extender: Skipper Cliche in Treatment: 4 Active Problems Location of Pain Severity and Description of Pain Patient Has Paino No Site Locations Pain Management and Medication Current Pain Management: Electronic Signature(s) Signed: 07/13/2022 12:35:42 PM By: Carlene Coria RN Entered By: Carlene Coria on 07/13/2022 11:36:56 [name] ([id number]) -------------------------------------------------------------------------------- Patient/Caregiver Education Details Patient Name: [name] Date of Service: 07/13/2022 11:00 AM Medical Record Number: [id number] Patient Account Number: [id number] Date of Birth/Gender: 18-Jan-1966 (56 y.o. M) Treating RN: Carlene Coria Primary Care Physician: PATIENT, NO Other Clinician: Referring Physician: Fredirick Maudlin Treating Physician/Extender: Skipper Cliche in Treatment: 4 Education Assessment Education Provided To: Patient Education Topics Provided Wound Debridement: Methods: Explain/Verbal Responses: State content correctly Electronic Signature(s) Signed: 07/13/2022 12:35:42 PM By: Carlene Coria RN Entered By: Carlene Coria on 07/13/2022 11:45:28 [name] ([id number]) -------------------------------------------------------------------------------- Wound Assessment Details Patient Name: [name] Date of Service: 07/13/2022 11:00 AM Medical Record Number: [id number] Patient Account Number: [id number] Date of Birth/Sex: 1965-12-25 (56 y.o. M) Treating RN:  Carlene Coria Primary Care  Dorathea Faerber: PATIENT, NO Other Clinician: Referring Curt Oatis: Duanne Guess Treating Remmy Riffe/Extender: Rowan Blase in Treatment: 4 Wound Status Wound Number: 1 Primary Etiology: Diabetic Wound/Ulcer of the Lower Extremity Wound Location: Left, Plantar Foot Wound Status: Open Wounding Event: Gradually Appeared Comorbid History: Type II Diabetes Date Acquired: 12/04/2021 Weeks Of Treatment: 4 Clustered Wound: No Pending Amputation On Presentation Photos Wound Measurements Length: (cm) 12.5 Width: (cm) 7.5 Depth: (cm) 0.4 Area: (cm) 73.631 Volume: (cm) 29.452 % Reduction in Area: 26.8% % Reduction in Volume: 41.4% Epithelialization: None Tunneling: No Undermining: No Wound Description Classification: Grade 4 F Exudate Amount: Medium S Exudate Type: Serosanguineous Exudate Color: red, brown oul Odor After Cleansing: No lough/Fibrino Yes Wound Bed Granulation Amount: Large (67-100%) Exposed Structure Granulation Quality: Pink, Pale, Hyper-granulation Fascia Exposed: Yes Necrotic Amount: Small (1-33%) Fat Layer (Subcutaneous Tissue) Exposed: Yes Necrotic Quality: Adherent Slough Tendon Exposed: Yes Muscle Exposed: Yes Necrosis of Muscle: Yes Joint Exposed: No Bone Exposed: Yes Treatment Notes Wound #1 (Foot) Wound Laterality: Plantar, Left Cleanser Dakin 16 (oz) 0.25 Discharge Instruction: Use as directed. [name], [name] ([id number]) Peri-Wound Care Topical Primary Dressing Gauze Discharge Instruction: moistened with 1/4 Dakins Solution Secondary Dressing ABD Pad 5x9 (in/in) Discharge Instruction: Cover with ABD pad Kerlix 4.5 x 4.1 (in/yd) Discharge Instruction: Apply Kerlix 4.5 x 4.1 (in/yd) as instructed Secured With Medipore Tape - 66M Medipore H Soft Cloth Surgical Tape, 2x2 (in/yd) Compression Wrap Compression Stockings Add-Ons Electronic Signature(s) Signed: 07/13/2022 12:35:42 PM By: Yevonne Pax RN Entered By: Yevonne Pax on 07/13/2022 11:37:32 [name] ([id number]) -------------------------------------------------------------------------------- Vitals Details Patient Name: [name] Date of Service: 07/13/2022 11:00 AM Medical Record Number: [id number] Patient Account Number: [id number] Date of Birth/Sex: [birth year] (56 y.o. M) Treating RN: Yevonne Pax Primary Care Aman Bonet: PATIENT, NO Other Clinician: Referring Nyazia Canevari: Duanne Guess Treating Vonceil Upshur/Extender: Rowan Blase in Treatment: 4 Vital Signs Time Taken: 11:36 Temperature (F): 98.1 Height (in): 70 Pulse (bpm): 88 Weight (lbs): 230 Respiratory Rate (breaths/min): 18 Body Mass Index (BMI): 33 Blood Pressure (mmHg): 156/91 Reference Range: 80 - 120 mg / dl Electronic Signature(s) Signed: 07/13/2022 12:35:42 PM By: Yevonne Pax RN Entered By: Yevonne Pax on 07/13/2022 11:36:45

## 2022-07-17 DIAGNOSIS — T700XXA Otitic barotrauma, initial encounter: Secondary | ICD-10-CM | POA: Diagnosis not present

## 2022-07-17 DIAGNOSIS — H6983 Other specified disorders of Eustachian tube, bilateral: Secondary | ICD-10-CM | POA: Diagnosis not present

## 2022-07-18 ENCOUNTER — Encounter: Payer: Self-pay | Admitting: Otolaryngology

## 2022-07-19 ENCOUNTER — Other Ambulatory Visit: Payer: Self-pay

## 2022-07-19 MED ORDER — CIPROFLOXACIN-DEXAMETHASONE 0.3-0.1 % OT SUSP
OTIC | 0 refills | Status: DC
  Filled 2022-07-19: qty 7.5, 10d supply, fill #0

## 2022-07-19 NOTE — Discharge Instructions (Signed)
[facility] DISCHARGE INSTRUCTIONS FOR MYRINGOTOMY AND TUBE INSERTION  [facility] PAUL JUENGEL, M.D.  Diet:   After surgery, the patient should take only liquids and foods as tolerated.  The patient may then have a regular diet after the effects of anesthesia have worn off, usually about four to six hours after surgery.  Activities:   The patient should rest until the effects of anesthesia have worn off.  After this, there are no restrictions on the normal daily activities.  Medications:   You will be given a prescription for antibiotic drops to be used in the ears postoperatively.  It is recommended to use 3 drops 3 times a day for 3 days, then the drops should be saved for possible future use.  The tubes should not cause any discomfort to the patient, but if there is any question, Tylenol should be given according to the instructions for the age of the patient.  Other medications should be continued normally.  Precautions:   Should there be recurrent drainage after the tubes are placed, the drops should be used for approximately 3-4 days.  If it does not clear, you should call the ENT office.  Earplugs:   Earplugs are only needed for those who are going to be submerged under water.  When taking a bath or shower and using a cup or showerhead to rinse hair, it is not necessary to wear earplugs.  These come in a variety of fashions, all of which can be obtained at our office.  However, if one is not able to come by the office, then silicone plugs can be found at most pharmacies.  It is not advised to stick anything in the ear that is not approved as an earplug.  Silly putty is not to be used as an earplug.  Swimming is allowed in patients after ear tubes are inserted, however, they must wear earplugs if they are going to be submerged under water.  For those children who are going to be swimming a lot, it is recommended to use a fitted ear mold, which can be made by  our audiologist.  If discharge is noticed from the ears, this most likely represents an ear infection.  We would recommend getting your eardrops and using them as indicated above.  If it does not clear, then you should call the ENT office.  For follow up, the patient should return to the ENT office three weeks postoperatively and then every six months as required by the doctor. 

## 2022-07-20 ENCOUNTER — Encounter: Payer: Self-pay | Admitting: Otolaryngology

## 2022-07-20 ENCOUNTER — Ambulatory Visit: Payer: 59 | Admitting: Anesthesiology

## 2022-07-20 ENCOUNTER — Encounter: Payer: 59 | Admitting: Physician Assistant

## 2022-07-20 ENCOUNTER — Encounter
Admission: RE | Disposition: A | Discharge status: Home / Self Care | Payer: Self-pay | Source: Home / Self Care | Attending: Otolaryngology

## 2022-07-20 ENCOUNTER — Ambulatory Visit
Admission: RE | Admit: 2022-07-20 | Discharge: 2022-07-20 | Disposition: A | Discharge status: Home / Self Care | Payer: 59 | Attending: Otolaryngology | Admitting: Otolaryngology

## 2022-07-20 ENCOUNTER — Ambulatory Visit (AMBULATORY_SURGERY_CENTER): Payer: 59 | Admitting: Anesthesiology

## 2022-07-20 ENCOUNTER — Other Ambulatory Visit: Payer: Self-pay

## 2022-07-20 DIAGNOSIS — H6990 Unspecified Eustachian tube disorder, unspecified ear: Secondary | ICD-10-CM

## 2022-07-20 DIAGNOSIS — F17210 Nicotine dependence, cigarettes, uncomplicated: Secondary | ICD-10-CM

## 2022-07-20 DIAGNOSIS — R69 Illness, unspecified: Secondary | ICD-10-CM | POA: Diagnosis not present

## 2022-07-20 DIAGNOSIS — E1122 Type 2 diabetes mellitus with diabetic chronic kidney disease: Secondary | ICD-10-CM

## 2022-07-20 DIAGNOSIS — H6983 Other specified disorders of Eustachian tube, bilateral: Secondary | ICD-10-CM | POA: Insufficient documentation

## 2022-07-20 DIAGNOSIS — T700XXA Otitic barotrauma, initial encounter: Secondary | ICD-10-CM | POA: Diagnosis not present

## 2022-07-20 DIAGNOSIS — X58XXXA Exposure to other specified factors, initial encounter: Secondary | ICD-10-CM | POA: Diagnosis not present

## 2022-07-20 DIAGNOSIS — N189 Chronic kidney disease, unspecified: Secondary | ICD-10-CM | POA: Diagnosis not present

## 2022-07-20 DIAGNOSIS — L97522 Non-pressure chronic ulcer of other part of left foot with fat layer exposed: Secondary | ICD-10-CM | POA: Diagnosis not present

## 2022-07-20 DIAGNOSIS — E11621 Type 2 diabetes mellitus with foot ulcer: Secondary | ICD-10-CM | POA: Diagnosis not present

## 2022-07-20 DIAGNOSIS — Z794 Long term (current) use of insulin: Secondary | ICD-10-CM

## 2022-07-20 DIAGNOSIS — E119 Type 2 diabetes mellitus without complications: Secondary | ICD-10-CM

## 2022-07-20 DIAGNOSIS — H6523 Chronic serous otitis media, bilateral: Secondary | ICD-10-CM | POA: Diagnosis not present

## 2022-07-20 HISTORY — PX: MYRINGOTOMY WITH TUBE PLACEMENT: SHX5663

## 2022-07-20 LAB — GLUCOSE, CAPILLARY
Glucose-Capillary: 165 mg/dL — ABNORMAL HIGH (ref 70–99)
Glucose-Capillary: 163 mg/dL — ABNORMAL HIGH (ref 70–99)

## 2022-07-20 SURGERY — MYRINGOTOMY WITH TUBE PLACEMENT
Anesthesia: General | Site: Ear | Laterality: Bilateral

## 2022-07-20 MED ORDER — OXYCODONE HCL 5 MG PO TABS: 5.0000 mg | ORAL_TABLET | Freq: Once | ORAL | Status: DC | PRN

## 2022-07-20 MED ORDER — LIDOCAINE HCL (CARDIAC) PF 100 MG/5ML IV SOSY
PREFILLED_SYRINGE | INTRAVENOUS | Status: DC | PRN
  Administered 2022-07-20: 100 mg via INTRAVENOUS

## 2022-07-20 MED ORDER — LACTATED RINGERS IV SOLN: INTRAVENOUS | Status: DC

## 2022-07-20 MED ORDER — ACETAMINOPHEN 500 MG PO TABS: 1000.0000 mg | ORAL_TABLET | Freq: Once | ORAL | Status: DC

## 2022-07-20 MED ORDER — CIPROFLOXACIN-DEXAMETHASONE 0.3-0.1 % OT SUSP
OTIC | Status: DC | PRN
  Administered 2022-07-20: 1 [drp] via OTIC

## 2022-07-20 MED ORDER — ONDANSETRON HCL 4 MG/2ML IJ SOLN
4.0000 mg | Freq: Once | INTRAMUSCULAR | Status: AC | PRN
  Administered 2022-07-20: 4 mg via INTRAVENOUS

## 2022-07-20 MED ORDER — OXYCODONE HCL 5 MG/5ML PO SOLN: 5.0000 mg | Freq: Once | ORAL | Status: DC | PRN

## 2022-07-20 MED ORDER — ACETAMINOPHEN 500 MG PO TABS
1000.0000 mg | ORAL_TABLET | Freq: Once | ORAL | Status: AC
  Administered 2022-07-20: 1000 mg via ORAL

## 2022-07-20 MED ORDER — FENTANYL CITRATE PF 50 MCG/ML IJ SOSY: 25.0000 ug | PREFILLED_SYRINGE | INTRAMUSCULAR | Status: DC | PRN

## 2022-07-20 MED ORDER — ACETAMINOPHEN 10 MG/ML IV SOLN: 1000.0000 mg | Freq: Once | INTRAVENOUS | Status: DC | PRN

## 2022-07-20 MED ORDER — PROPOFOL 10 MG/ML IV BOLUS
INTRAVENOUS | Status: DC | PRN
  Administered 2022-07-20: 50 mg via INTRAVENOUS

## 2022-07-20 SURGICAL SUPPLY — 11 items
BALL CTTN LRG ABS STRL LF (GAUZE/BANDAGES/DRESSINGS) ×1
BLADE MYRINGOTOMY 6 SPEAR HDL (BLADE) ×1 IMPLANT
CANISTER SUCT 1200ML W/VALVE (MISCELLANEOUS) ×1 IMPLANT
COTTONBALL LRG STERILE PKG (GAUZE/BANDAGES/DRESSINGS) ×1 IMPLANT
GLOVE SURG GAMMEX PI TX LF 7.5 (GLOVE) ×1 IMPLANT
STRAP BODY AND KNEE 60X3 (MISCELLANEOUS) ×1 IMPLANT
TOWEL OR 17X26 4PK STRL BLUE (TOWEL DISPOSABLE) ×1 IMPLANT
TUBE EAR ARMSTRONG FL 1.14X4.5 (OTOLOGIC RELATED) ×2 IMPLANT
TUBE EAR ARMSTRONG HC 1.14X3.5 (OTOLOGIC RELATED) IMPLANT
TUBING CONN 6MMX3.1M (TUBING) ×1
TUBING SUCTION CONN 0.25 STRL (TUBING) ×1 IMPLANT

## 2022-07-20 NOTE — Anesthesia Procedure Notes (Signed)
Procedure Name: General with mask airway Date/Time: 07/20/2022 9:30 AM  Performed by: Taylen Wendland T, CRNAPre-anesthesia Checklist: Patient identified, Emergency Drugs available, Suction available and Patient being monitored Patient Re-evaluated:Patient Re-evaluated prior to induction Oxygen Delivery Method: Circle system utilized Preoxygenation: Pre-oxygenation with 100% oxygen Induction Type: IV induction Ventilation: Mask ventilation without difficulty

## 2022-07-20 NOTE — Transfer of Care (Signed)
Immediate Anesthesia Transfer of Care Note  Patient: [name]  Procedure(s) Performed: MYRINGOTOMY WITH TUBE PLACEMENT (Bilateral)  Patient Location: PACU  Anesthesia Type: General  Level of Consciousness: awake, alert  and patient cooperative  Airway and Oxygen Therapy: Patient Spontanous Breathing and Patient connected to supplemental oxygen  Post-op Assessment: Post-op Vital signs reviewed, Patient's Cardiovascular Status Stable, Respiratory Function Stable, Patent Airway and No signs of Nausea or vomiting  Post-op Vital Signs: Reviewed and stable  Complications: No notable events documented.

## 2022-07-20 NOTE — H&P (Signed)
.  PHJ °

## 2022-07-20 NOTE — Anesthesia Preprocedure Evaluation (Signed)
Anesthesia Evaluation  Patient identified by MRN, date of birth, ID band Patient awake    Reviewed: Allergy & Precautions, NPO status , Patient's Chart, lab work & pertinent test results  History of Anesthesia Complications Negative for: history of anesthetic complications  Airway Mallampati: II  TM Distance: >3 FB Neck ROM: Full    Dental  (+) Poor Dentition   Pulmonary neg pulmonary ROS, neg sleep apnea, neg COPD, Current Smoker and Patient abstained from smoking.,    Pulmonary exam normal breath sounds clear to auscultation       Cardiovascular Exercise Tolerance: Good METS(-) hypertension(-) CAD and (-) Past MI negative cardio ROS  (-) dysrhythmias  Rhythm:Regular Rate:Normal - Systolic murmurs    Neuro/Psych negative neurological ROS  negative psych ROS   GI/Hepatic neg GERD  ,(+)     (-) substance abuse  ,   Endo/Other  diabetes, Poorly Controlled, Insulin DependentHasn't taken ozempic in at least two weeks.  Renal/GU CRFRenal disease     Musculoskeletal   Abdominal   Peds  Hematology   Anesthesia Other Findings Past Medical History: No date: Diabetes mellitus without complication (HCC)  Reproductive/Obstetrics                             Anesthesia Physical Anesthesia Plan  ASA: 3  Anesthesia Plan: General   Post-op Pain Management: Minimal or no pain anticipated   Induction: Intravenous  PONV Risk Score and Plan: 2 and Ondansetron and Dexamethasone  Airway Management Planned: Natural Airway and Mask  Additional Equipment: None  Intra-op Plan:   Post-operative Plan: Extubation in OR  Informed Consent: I have reviewed the patients History and Physical, chart, labs and discussed the procedure including the risks, benefits and alternatives for the proposed anesthesia with the patient or authorized representative who has indicated his/her understanding and acceptance.      Dental advisory given  Plan Discussed with: CRNA and Surgeon  Anesthesia Plan Comments: (Discussed risks of anesthesia with patient, including PONV, sore throat, lip/dental/eye damage. Rare risks discussed as well, such as cardiorespiratory and neurological sequelae, and allergic reactions. Discussed the role of CRNA in patient's perioperative care. Patient understands.)        Anesthesia Quick Evaluation

## 2022-07-20 NOTE — Anesthesia Postprocedure Evaluation (Signed)
Anesthesia Post Note  Patient: [name]  Procedure(s) Performed: MYRINGOTOMY WITH TUBE PLACEMENT (Bilateral: Ear)     Patient location during evaluation: PACU Anesthesia Type: General Level of consciousness: awake and alert Pain management: pain level controlled Vital Signs Assessment: post-procedure vital signs reviewed and stable Respiratory status: spontaneous breathing, nonlabored ventilation, respiratory function stable and patient connected to nasal cannula oxygen Cardiovascular status: blood pressure returned to baseline and stable Postop Assessment: no apparent nausea or vomiting Anesthetic complications: no   No notable events documented.  Corinda Gubler

## 2022-07-20 NOTE — Op Note (Signed)
07/20/2022  9:42 AM    [name]  [id number]   Pre-Op Dx: Junita Push tube dysfunction, hyperbaric trauma  Post-op Dx: Same  Proc:Bilateral myringotomy with tubes  Surg: Cammy Copa  Anes:  General by mask  EBL:  None  Comp: None  Findings: Hypermobile eardrum on the right side that mask anesthesia was auto inflating and pushing outward.  No sign of fluid on either side.  Procedure: With the patient in a comfortable supine position, general mask anesthesia was administered.  At an appropriate level, microscope and speculum were used to examine and clean the RIGHT ear canal.  The findings were as described above.  An anterior inferior radial myringotomy incision was sharply executed.  Middle ear contents were suctioned clear.  A PE tube was placed without difficulty.  Ciprodex otic solution was instilled into the external canal, and insufflated into the middle ear.  A cotton ball was placed at the external meatus. Hemostasis was observed.  This side was completed.  After completing the RIGHT side, the LEFT side was done in identical fashion.    Following this  The patient was returned to anesthesia, awakened, and transferred to recovery in stable condition.  Dispo:  PACU to home  Plan: Routine drop use and water precautions.  Recheck in office two to three weeks with audiogram.   Cammy Copa 9:42 AM 07/20/2022

## 2022-07-21 ENCOUNTER — Encounter: Payer: 59 | Admitting: Physician Assistant

## 2022-07-21 ENCOUNTER — Encounter: Payer: Self-pay | Admitting: Otolaryngology

## 2022-07-21 NOTE — Progress Notes (Signed)
[name], [name] ([id number]) Visit Report for 07/20/2022 Chief Complaint Document Details Patient Name: [name], [name] Date of Service: 07/20/2022 11:00 AM Medical Record Number: [id number] Patient Account Number: [id number] Date of Birth/Sex: Aug 21, 1966 (56 y.o. 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 M) Treating RN: Yevonne Pax Primary Care Provider: PATIENT, NO Other Clinician: Referring Provider: Duanne Guess Treating Provider/Extender: Rowan Blase in Treatment: 5 Debridement Performed for Wound #1 Left,Plantar Foot Assessment: Performed By: Physician Nelida Meuse., PA-C Debridement Type: Debridement Severity of Tissue Pre Debridement: Fat layer exposed Level of Consciousness (Pre- Awake and Alert procedure): Pre-procedure Verification/Time Out Yes - 11:40 Taken: Start Time: 11:40 Pain Control: Lidocaine 4% Topical Solution Total Area Debrided (L x W): 3 (cm) x 5 (cm) = 15 (cm) Tissue and other material Viable, Non-Viable, Slough, Subcutaneous, Tendon, Skin: Dermis , Skin: Epidermis, Slough debrided: Level: Skin/Subcutaneous Tissue/Muscle Debridement Description: Excisional Instrument: Curette Bleeding: Minimum Hemostasis Achieved: Pressure End Time: 11:44 Procedural Pain: 0 Post  Procedural Pain: 0 Response to Treatment: Procedure was tolerated well Level of Consciousness (Post- Awake and Alert procedure): Post Debridement Measurements of Total Wound Length: (cm) 13 Width: (cm) 0.8 Depth: (cm) 0.4 Volume: (cm) 3.267 Character of Wound/Ulcer Post Debridement: Improved Severity of Tissue Post Debridement: Fat layer exposed Post Procedure Diagnosis Same as Pre-procedure Electronic Signature(s) Signed: 07/20/2022 5:21:19 PM By: Lenda Kelp PA-C Signed: 07/21/2022 6:13:50 PM By: Yevonne Pax RN Entered By: Yevonne Pax on 07/20/2022 11:45:19 [name] ([id number]) -------------------------------------------------------------------------------- HPI Details Patient Name: [name] Date of Service: 07/20/2022 11:00 AM Medical Record Number: [id number] Patient Account Number: [id number] Date of Birth/Sex: 06-Sep-1966 (56 y.o. M) Treating RN: Yevonne Pax Primary Care Provider: PATIENT, NO Other Clinician: Referring Provider: Duanne Guess Treating Provider/Extender: Rowan Blase in Treatment: 5 History of Present Illness HPI Description: Notes from [facility] under Dr. America Brown care:   ADMISSION6/15/2023This is a 56 year old poorly controlled type II diabetic (last A1c 11.5%). In January of this year, he presented to the hospital with an abscess in his left foot. He also had a foreign body present. It was removed in the ER and he was admitted to the hospital for IV antibiotics. He was subsequently discharged on oral antibiotics. Due to financial concerns, he has not taken his diabetic medications (metformin and 70/30 insulin) and as a result, presented back to the emergency room on June 1 with worsening findings, including frank pus and osteomyelitis on MRI. BKA was recommended, but he declined. He was taken to the operating room by Dr. Lajoyce Corners who performed an extensive debridement. Cultures were taken and he was placed on IV  antibiotics. He saw infectious disease while in the hospital who prescribed a 6-week course of IV antibiotics including cefazolin as well as oral metronidazole and levofloxacin. The infectious disease provider also indicated that he would benefit from below-knee amputation, but the patient desired another opinion and therefore he has been referred to wound care center for further evaluation and management. ABIs obtained while he was in the hospital demonstrate  adequate blood flow. Essentially the entire bottom of the patient's left foot has been removed. The heads of the majority of his metatarsal bones are exposed along with their accompanying tendons. Muscle and fat are exposed as well. The wound surface is fairly dry; the patient reports that he was not given any specific wound care instructions other than to place a dry dressing on it after showering. There is no odor or purulent drainage identified. 05/26/2022: The wound measures a little bit smaller today. There is some good granulation tissue beginning to form on the surface. He still has exposed bone and tendon. There is some slough accumulation. He saw infectious disease earlier this week and was frustrated that they recommended amputation. He is interested in another surgical opinion. He continues to smoke but says that he has cut down. He is using a knee scooter for mobility so that he can stay off of his foot. 06/05/2022: He saw Dr. Lajoyce Corners last week who was supportive of our efforts to try to salvage the foot. I referred him to vascular surgery to see Dr. Chestine Spore but he has not received an appointment yet. He continues on IV antibiotics. He says that after our conversation last week about him being unable to initiate hyperbarics if he was going to continue to smoke, he has not had a cigarette since. He is now at 4 weeks of conventional management and his insurance was initiated on July 1. Today, the wound continues to show evidence of improvement.  There is good granulation tissue forming. The metatarsals are still exposed but actually have some pink periosteum. There is still some slough and fibrinous exudate present. No odor or purulent drainage. We have just been using Dakin's dressing changes for now. Admission to Wound Care Center at [facility]: 06-15-2022 upon evaluation today patient presents for initial inspection here in our clinic though he has been seen by Dr. Lady Gary in [place] up to this point. I did review her notes and records as well they have been using Dakin's moistened gauze dressing which has done a great job at helping to clean out the bottom of his foot. Please see the discourse above for additional information in regard to treatment course up until the patient arrives here in the [place] clinic today. The patient is ready to get started with HBO therapy as soon as possible I do think based on all my review of his records this seems to be appropriate he does have chronic osteomyelitis is also been offered amputation this is obviously a limb salvage operation here. We are trying to prevent him from having to undergo an extensive amputation which would likely be a below-knee amputation which in turn is going to affect his quality of life he wants to try to avoid that if at all possible I think hyperbaric oxygen therapy is his best bet to achieve that goal. Currently he has been on IV Ancef. That actually ends tomorrow. Following that according to notes it appears he supposed to be taking Levaquin and Flagyl for an additional period of time although I am not certain exactly how long that with the. He has been seeing regional physicians infectious disease in [place] and again that so I recommended that he contact today in order to find out what the plan is going forward. His most recent hemoglobin A1c as noted above was 11.5 on 05-07-2022 he tells me trying to work on that he is also quit smoking as of this point. 06-22-2022  upon evaluation today  patient appears to be doing well currently in regard to his wound all things considered. He is showing signs of a lot of new granulation tissue and overall very pleased. He is changing this once a day with the Dakin's moistened gauze dressing. Fortunately I do not see any evidence of infection locally or systemically at this time. We have been trying to get him into the hyperbaric chamber he was having a lot of issues with sinus pressure and we have made referral and called multiple ENT specialist the earliest we have been able to get his August 9 at [facility] ear nose throat. They are going to try to work him in sooner if they have any opportunities to do so. Obviously I think the sooner he can do this the better. We need to get him in hyperbarics as quickly as possible. 7/26; patient presents for follow-up. He has been using Dakin's wet-to-dry dressings and oil emollient dressing over the exposed bone. He currently denies systemic signs of infection. He states he is scheduled to see infectious disease, Dr Thedore Mins next week. She had ordered an MRI of the left foot and he is scheduled to have this done tomorrow. He currently denies systemic signs of infection Including fever/chills, nausea/vomiting increased warmth or erythema to the wound bed or purulent drainage. He started hyperbaric oxygen treatment however did not tolerate this due to sinus pressure. He is scheduled to see ENT in 2 weeks for evaluation. He is not continuing HBO until he is cleared by ENT. 07-06-2022 patient presents today for follow-up concerning his plantar foot ulcer. The good news is this actually is significantly better compared to previous. Some of the bone which is necrotic is starting to slough off and I am going to perform debridement to clear this away today. Nonetheless I do think that the patient is going to need hyperbarics and we need to try to get it as soon as possible. He still having a lot of  sinus pressure I can even hear the congestion today. He is on 3 different antibiotics cefadroxil, metronidazole, and Levaquin currently for the osteomyelitis. For that reason this should actually help with the infection if he does have a chronic sinusitis I am also going to see about getting him on steroids to try to see if this can benefit him as well. He voiced understanding and he is in agreement with giving that shot. This is probably can to spike his blood sugars which I discussed with him he is going to be seeing his primary care provider later today I want him to let her know [name], [name] ([id number]) as well what is going on and why so that she could be aware but I really do think he needs to take the prednisone. We need to get him in the chamber as soon as possible to try to help with getting this wound to heal and closed so that he does not lose his foot. 07-13-2022 upon evaluation today patient appears to be doing better in regard to his wound he continues to show signs of new granulation am going to perform some debridement today but overall this is doing quite well. 07-20-2022 upon evaluation today patient's wound is actually showing some signs of improvement he does have 1 area on the fifth metatarsal region where there is some necrotic tendon that is noted at this point. Unfortunately this is preventing good granulation and over this point the rest of the metatarsal region is completely covered over with granulation tissue  which is great news there is some other slough and biofilm buildup that I will clearway as well. Electronic Signature(s) Signed: 07/20/2022 3:21:47 PM By: Lenda Kelp PA-C Entered By: Lenda Kelp on 07/20/2022 15:21:47 [name], [name] ([id number]) -------------------------------------------------------------------------------- Physical Exam Details Patient Name: [name] Date of Service: 07/20/2022 11:00 AM Medical Record Number: [id number] Patient Account  Number: [id number] Date of Birth/Sex: 13-Feb-1966 (56 y.o. M) Treating RN: Yevonne Pax Primary Care Provider: PATIENT, NO Other Clinician: Referring Provider: Duanne Guess Treating Provider/Extender: Rowan Blase in Treatment: 5 Constitutional Well-nourished and well-hydrated in no acute distress. Respiratory normal breathing without difficulty. Psychiatric this patient is able to make decisions and demonstrates good insight into disease process. Alert and Oriented x 3. pleasant and cooperative. Notes Upon inspection patient's wound bed actually showed signs of good granulation and epithelization at this point. Fortunately I do not see any evidence of active infection locally or systemically which is great news and overall I am extremely pleased with where we stand currently. Electronic Signature(s) Signed: 07/20/2022 3:22:04 PM By: Lenda Kelp PA-C Entered By: Lenda Kelp on 07/20/2022 15:22:03 [name], [name] ([id number]) -------------------------------------------------------------------------------- Physician Orders Details Patient Name: [name] Date of Service: 07/20/2022 11:00 AM Medical Record Number: [id number] Patient Account Number: [id number] Date of Birth/Sex: [birth year] (56 y.o. M) Treating RN: Yevonne Pax Primary Care Provider: PATIENT, NO Other Clinician: Referring Provider: Duanne Guess Treating Provider/Extender: Rowan Blase in Treatment: 5 Verbal / Phone Orders: No Diagnosis Coding ICD-10 Coding Code Description [phone] Other chronic osteomyelitis, left ankle and foot E11.621 Type 2 diabetes mellitus with foot ulcer L97.524 Non-pressure chronic ulcer of other part of left foot with necrosis of bone Follow-up Appointments o Return Appointment in 1 week. Bathing/ Shower/ Hygiene o May shower; gently cleanse wound with antibacterial soap, rinse and pat dry prior to dressing wounds Anesthetic (Use 'Patient Medications' Section for  Anesthetic Order Entry) o Lidocaine applied to wound bed Edema Control - Lymphedema / Segmental Compressive Device / Other o Elevate, Exercise Daily and Avoid Standing for Long Periods of Time. o Elevate legs to the level of the heart and pump ankles as often as possible o Elevate leg(s) parallel to the floor when sitting. Hyperbaric Oxygen Therapy o Evaluate for HBO Therapy o Indication and location: - osteomyelitis left foot o 2.5 ATA for 90 Minutes with 2 Five (5) Minute Air Breaks - total 40 treatments o Total # of Treatments: - 40 o Antihistamine 30 minutes prior to HBO Treatment, difficulty clearing ears. o Finger stick Blood Glucose Pre- and Post- HBOT Treatment. o Follow Hyperbaric Oxygen Glycemia Protocol Wound Treatment Wound #1 - Foot Wound Laterality: Plantar, Left Cleanser: Dakin 16 (oz) 0.25 1 x Per Day/30 Days Discharge Instructions: Use as directed. Primary Dressing: Gauze (Generic) 1 x Per Day/30 Days Discharge Instructions: moistened with 1/4 Dakins Solution Secondary Dressing: ABD Pad 5x9 (in/in) (Generic) 1 x Per Day/30 Days Discharge Instructions: Cover with ABD pad Secondary Dressing: Kerlix 4.5 x 4.1 (in/yd) (Generic) 1 x Per Day/30 Days Discharge Instructions: Apply Kerlix 4.5 x 4.1 (in/yd) as instructed Secured With: Medipore Tape - 66M Medipore H Soft Cloth Surgical Tape, 2x2 (in/yd) (Generic) 1 x Per Day/30 Days GLYCEMIA INTERVENTIONS PROTOCOL PRE-HBO GLYCEMIA INTERVENTIONS ACTION INTERVENTION Obtain pre-HBO capillary blood glucose (ensure 1 physician order is in chart). 2 If result is 70 mg/dl or below: A. Notify HBO physician and await physician orders. HALLUM, Kori ([id number]) B. If the result meets the hospital definition of a critical result,  follow hospital policy. A. Give patient an 8 ounce Glucerna Shake, an 8 ounce Ensure, or 8 ounces of a Glucerna/Ensure equivalent dietary supplement*. B. Wait 30 minutes. If result  is 71 mg/dl to 161 mg/dl: C. Retest patientos capillary blood glucose (CBG). D. If result greater than or equal to 110 mg/dl, proceed with HBO. If result less than 110 mg/dl, notify HBO physician and consider holding HBO. If result is 131 mg/dl to 096 mg/dl: A. Proceed with HBO. A. Notify HBO physician and await physician orders. B. It is recommended to hold HBO and do If result is 250 mg/dl or greater: blood/urine ketone testing. C. If the result meets the hospital definition of a critical result, follow hospital policy. POST-HBO GLYCEMIA INTERVENTIONS ACTION INTERVENTION Obtain post HBO capillary blood glucose (ensure 1 physician order is in chart). A. Notify HBO physician and await physician orders. 2 If result is 70 mg/dl or below: B. If the result meets the hospital definition of a critical result, follow hospital policy. A. Give patient an 8 ounce Glucerna Shake, an 8 ounce Ensure, or 8 ounces of a Glucerna/Ensure equivalent dietary supplement*. B. Wait 15 minutes for symptoms of hypoglycemia (i.e. nervousness, anxiety, If result is 71 mg/dl to 045 mg/dl: sweating, chills, clamminess, irritability, confusion, tachycardia or dizziness). C. If patient asymptomatic, discharge patient. If patient symptomatic, repeat capillary blood glucose (CBG) and notify HBO physician. If result is 101 mg/dl to 409 mg/dl: A. Discharge patient. A. Notify HBO physician and await physician orders. B. It is recommended to do blood/urine If result is 250 mg/dl or greater: ketone testing. C. If the result meets the hospital definition of a critical result, follow hospital policy. *Juice or candies are NOT equivalent products. If patient refuses the Glucerna or Ensure, please consult the hospital dietitian for an appropriate substitute. Electronic Signature(s) Signed: 07/20/2022 5:21:19 PM By: Lenda Kelp PA-C Signed: 07/21/2022 6:13:50 PM By: Yevonne Pax RN Entered By: Yevonne Pax on 07/20/2022 11:45:11 [name] ([id number]) -------------------------------------------------------------------------------- Problem List Details Patient Name: [name] Date of Service: 07/20/2022 11:00 AM Medical Record Number: [id number] Patient Account Number: [id number] Date of Birth/Sex: 29-Jan-1966 (56 y.o. M) Treating RN: Yevonne Pax Primary Care Provider: PATIENT, NO Other Clinician: Referring Provider: Duanne Guess Treating Provider/Extender: Rowan Blase in Treatment: 5 Active Problems ICD-10 Encounter Code Description Active Date MDM Diagnosis [phone] Other chronic osteomyelitis, left ankle and foot 06/15/2022 No Yes E11.621 Type 2 diabetes mellitus with foot ulcer 06/15/2022 No Yes L97.524 Non-pressure chronic ulcer of other part of left foot with necrosis of 06/15/2022 No Yes bone Inactive Problems Resolved Problems Electronic Signature(s) Signed: 07/20/2022 11:27:34 AM By: Lenda Kelp PA-C Entered By: Lenda Kelp on 07/20/2022 11:27:34 [name] ([id number]) -------------------------------------------------------------------------------- Progress Note Details Patient Name: [name] Date of Service: 07/20/2022 11:00 AM Medical Record Number: [id number] Patient Account Number: [id number] Date of Birth/Sex: 01-06-1966 (56 y.o. 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 America Brown care:   ADMISSION6/15/2023This is a 56 year old poorly controlled type II diabetic (last A1c 11.5%). In January of this year, he presented to the hospital with an abscess in his left foot. He also had a foreign  body present. It was removed in the ER and he  was admitted to the hospital for IV antibiotics. He was subsequently discharged on oral antibiotics. Due to financial concerns, he has not taken his diabetic medications (metformin and 70/30 insulin) and as a result, presented back to the emergency room on June 1 with worsening findings, including frank pus and osteomyelitis on MRI. BKA was recommended, but he declined. He was taken to the operating room by Dr. Lajoyce Corners who performed an extensive debridement. Cultures were taken and he was placed on IV antibiotics. He saw infectious disease while in the hospital who prescribed a 6-week course of IV antibiotics including cefazolin as well as oral metronidazole and levofloxacin. The infectious disease provider also indicated that he would benefit from below-knee amputation, but the patient desired another opinion and therefore he has been referred to wound care center for further evaluation and management. ABIs obtained while he was in the hospital demonstrate adequate blood flow. Essentially the entire bottom of the patient's left foot has been removed. The heads of the majority of his metatarsal bones are exposed along with their accompanying tendons. Muscle and fat are exposed as well. The wound surface is fairly dry; the patient reports that he was not given any specific wound care instructions other than to place a dry dressing on it after showering. There is no odor or purulent drainage identified. 05/26/2022: The wound measures a little bit smaller today. There is some good granulation tissue beginning to form on the surface. He still has exposed bone and tendon. There is some slough accumulation. He saw infectious disease earlier this week and was frustrated that they recommended amputation. He is interested in another surgical opinion. He continues to smoke but says that he has cut down. He is using a knee scooter for mobility so that he can stay off of  his foot. 06/05/2022: He saw Dr. Lajoyce Corners last week who was supportive of our efforts to try to salvage the foot. I referred him to vascular surgery to see Dr. Chestine Spore but he has not received an appointment yet. He continues on IV antibiotics. He says that after our conversation last week about him being unable to initiate hyperbarics if he was going to continue to smoke, he has not had a cigarette since. He is now at 4 weeks of conventional management and his insurance was initiated on July 1. Today, the wound continues to show evidence of improvement. There is good granulation tissue forming. The metatarsals are still exposed but actually have some pink periosteum. There is still some slough and fibrinous exudate present. No odor or purulent drainage. We have just been using Dakin's dressing changes for now. Admission to Wound Care Center at [facility]: 06-15-2022 upon evaluation today patient presents for initial inspection here in our clinic though he has been seen by Dr. Lady Gary in [place] up to this point. I did review her notes and records as well they have been using Dakin's moistened gauze dressing which has done a great job at helping to clean out the bottom of his foot. Please see the discourse above for additional information in regard to treatment course up until the patient arrives here in the [place] clinic today. The patient is ready to get started with HBO therapy as soon as possible I do think based on all my review of his records this seems to be appropriate he does have chronic osteomyelitis is also been offered amputation this is obviously a limb salvage operation here. We are trying to prevent him from having to undergo an  extensive amputation which would likely be a below-knee amputation which in turn is going to affect his quality of life he wants to try to avoid that if at all possible I think hyperbaric oxygen therapy is his best bet to achieve that goal. Currently he has been on IV  Ancef. That actually ends tomorrow. Following that according to notes it appears he supposed to be taking Levaquin and Flagyl for an additional period of time although I am not certain exactly how long that with the. He has been seeing regional physicians infectious disease in [place] and again that so I recommended that he contact today in order to find out what the plan is going forward. His most recent hemoglobin A1c as noted above was 11.5 on 05-07-2022 he tells me trying to work on that he is also quit smoking as of this point. 06-22-2022 upon evaluation today patient appears to be doing well currently in regard to his wound all things considered. He is showing signs of a lot of new granulation tissue and overall very pleased. He is changing this once a day with the Dakin's moistened gauze dressing. Fortunately I do not see any evidence of infection locally or systemically at this time. We have been trying to get him into the hyperbaric chamber he was having a lot of issues with sinus pressure and we have made referral and called multiple ENT specialist the earliest we have been able to get his August 9 at [facility] ear nose throat. They are going to try to work him in sooner if they have any opportunities to do so. Obviously I think the sooner he can do this the better. We need to get him in hyperbarics as quickly as possible. 7/26; patient presents for follow-up. He has been using Dakin's wet-to-dry dressings and oil emollient dressing over the exposed bone. He currently denies systemic signs of infection. He states he is scheduled to see infectious disease, Dr Thedore Mins next week. She had ordered an MRI of the left foot and he is scheduled to have this done tomorrow. He currently denies systemic signs of infection Including fever/chills, nausea/vomiting increased warmth or erythema to the wound bed or purulent drainage. He started hyperbaric oxygen treatment however did not tolerate this due to  sinus pressure. He is scheduled to see ENT in 2 weeks for evaluation. He is not continuing HBO until he is cleared by ENT. 07-06-2022 patient presents today for follow-up concerning his plantar foot ulcer. The good news is this actually is significantly better compared to previous. Some of the bone which is necrotic is starting to slough off and I am going to perform debridement to clear this away today. [name], [name] ([id number]) Nonetheless I do think that the patient is going to need hyperbarics and we need to try to get it as soon as possible. He still having a lot of sinus pressure I can even hear the congestion today. He is on 3 different antibiotics cefadroxil, metronidazole, and Levaquin currently for the osteomyelitis. For that reason this should actually help with the infection if he does have a chronic sinusitis I am also going to see about getting him on steroids to try to see if this can benefit him as well. He voiced understanding and he is in agreement with giving that shot. This is probably can to spike his blood sugars which I discussed with him he is going to be seeing his primary care provider later today I want him to let her know  as well what is going on and why so that she could be aware but I really do think he needs to take the prednisone. We need to get him in the chamber as soon as possible to try to help with getting this wound to heal and closed so that he does not lose his foot. 07-13-2022 upon evaluation today patient appears to be doing better in regard to his wound he continues to show signs of new granulation am going to perform some debridement today but overall this is doing quite well. 07-20-2022 upon evaluation today patient's wound is actually showing some signs of improvement he does have 1 area on the fifth metatarsal region where there is some necrotic tendon that is noted at this point. Unfortunately this is preventing good granulation and over this point the rest of  the metatarsal region is completely covered over with granulation tissue which is great news there is some other slough and biofilm buildup that I will clearway as well. Objective Constitutional Well-nourished and well-hydrated in no acute distress. Vitals Time Taken: 11:13 AM, Height: 70 in, Weight: 230 lbs, BMI: 33, Temperature: 97.9 F, Pulse: 77 bpm, Respiratory Rate: 18 breaths/min, Blood Pressure: 173/94 mmHg. Respiratory normal breathing without difficulty. Psychiatric this patient is able to make decisions and demonstrates good insight into disease process. Alert and Oriented x 3. pleasant and cooperative. General Notes: Upon inspection patient's wound bed actually showed signs of good granulation and epithelization at this point. Fortunately I do not see any evidence of active infection locally or systemically which is great news and overall I am extremely pleased with where we stand currently. Integumentary (Hair, Skin) Wound #1 status is Open. Original cause of wound was Gradually Appeared. The date acquired was: 12/04/2021. The wound has been in treatment 5 weeks. The wound is located on the Left,Plantar Foot. The wound measures 13cm length x 8cm width x 0.4cm depth; 81.681cm^2 area and 32.673cm^3 volume. There is bone, muscle, tendon, Fat Layer (Subcutaneous Tissue), and fascia exposed. There is no tunneling or undermining noted. There is a medium amount of serosanguineous drainage noted. There is large (67-100%) pink, pale, hyper - granulation within the wound bed. There is a small (1-33%) amount of necrotic tissue within the wound bed including Adherent Slough and Necrosis of Muscle. Assessment Active Problems ICD-10 Other chronic osteomyelitis, left ankle and foot Type 2 diabetes mellitus with foot ulcer Non-pressure chronic ulcer of other part of left foot with necrosis of bone Procedures Wound #1 Pre-procedure diagnosis of Wound #1 is a Diabetic Wound/Ulcer of the Lower  Extremity located on the Left,Plantar Foot .Severity of Tissue Pre Debridement is: Fat layer exposed. There was a Excisional Skin/Subcutaneous Tissue/Muscle Debridement with a total area of 15 sq cm performed by Nelida Meuse., PA-C. With the following instrument(s): Curette to remove Viable and Non-Viable tissue/material. Material removed includes Tendon, Subcutaneous Tissue, Slough, Skin: Dermis, and Skin: Epidermis after achieving pain control using Lidocaine 4% Topical [name], [name] ([id number]) Solution. No specimens were taken. A time out was conducted at 11:40, prior to the start of the procedure. A Minimum amount of bleeding was controlled with Pressure. The procedure was tolerated well with a pain level of 0 throughout and a pain level of 0 following the procedure. Post Debridement Measurements: 13cm length x 0.8cm width x 0.4cm depth; 3.267cm^3 volume. Character of Wound/Ulcer Post Debridement is improved. Severity of Tissue Post Debridement is: Fat layer exposed. Post procedure Diagnosis Wound #1: Same as Pre-Procedure Plan Follow-up Appointments:  Return Appointment in 1 week. Bathing/ Shower/ Hygiene: May shower; gently cleanse wound with antibacterial soap, rinse and pat dry prior to dressing wounds Anesthetic (Use 'Patient Medications' Section for Anesthetic Order Entry): Lidocaine applied to wound bed Edema Control - Lymphedema / Segmental Compressive Device / Other: Elevate, Exercise Daily and Avoid Standing for Long Periods of Time. Elevate legs to the level of the heart and pump ankles as often as possible Elevate leg(s) parallel to the floor when sitting. Hyperbaric Oxygen Therapy: Evaluate for HBO Therapy Indication and location: - osteomyelitis left foot 2.5 ATA for 90 Minutes with 2 Five (5) Minute Air Breaks - total 40 treatments Total # of Treatments: - 40 Antihistamine 30 minutes prior to HBO Treatment, difficulty clearing ears. Finger stick Blood Glucose Pre- and  Post- HBOT Treatment. Follow Hyperbaric Oxygen Glycemia Protocol WOUND #1: - Foot Wound Laterality: Plantar, Left Cleanser: Dakin 16 (oz) 0.25 1 x Per Day/30 Days Discharge Instructions: Use as directed. Primary Dressing: Gauze (Generic) 1 x Per Day/30 Days Discharge Instructions: moistened with 1/4 Dakins Solution Secondary Dressing: ABD Pad 5x9 (in/in) (Generic) 1 x Per Day/30 Days Discharge Instructions: Cover with ABD pad Secondary Dressing: Kerlix 4.5 x 4.1 (in/yd) (Generic) 1 x Per Day/30 Days Discharge Instructions: Apply Kerlix 4.5 x 4.1 (in/yd) as instructed Secured With: Medipore Tape - 54M Medipore H Soft Cloth Surgical Tape, 2x2 (in/yd) (Generic) 1 x Per Day/30 Days 1. I am going to suggest that we go ahead and continue with the wound care measures as before and the patient is in agreement with plan. This includes the use of the Dakin's moistened gauze dressing which I think is doing a good job at helping keep things clean and allowing for good granulation and epithelization. 2. I am also can recommend that we continue with an ABD pad to cover. 3. I am also can recommend roll gauze to secure in place which I think is doing an awesome job as far as this is concerned he is using the knee scooter for offloading as well. We will see patient back for reevaluation in 1 week here in the clinic. If anything worsens or changes patient will contact our office for additional recommendations. Electronic Signature(s) Signed: 07/20/2022 3:22:38 PM By: Lenda KelpStone III, Ediel Unangst PA-C Entered By: Lenda KelpStone III, Noriko Macari on 07/20/2022 15:22:37 Matthew JanskyMARTIN, [name] ([id number]) -------------------------------------------------------------------------------- SuperBill Details Patient Name: Matthew JanskyMARTIN, [name] Date of Service: 07/20/2022 Medical Record Number: [id number] Patient Account Number: [id number] Date of Birth/Sex: Mar 13, 1966 (56 y.o. M) Treating RN: Yevonne PaxEpps, Carrie Primary Care Provider: PATIENT, NO Other Clinician: Referring  Provider: Duanne Guessannon, Jennifer Treating Provider/Extender: Rowan BlaseStone, Rashelle Ireland Weeks in Treatment: 5 Diagnosis Coding ICD-10 Codes Code Description 3165120880M86.672 Other chronic osteomyelitis, left ankle and foot E11.621 Type 2 diabetes mellitus with foot ulcer L97.524 Non-pressure chronic ulcer of other part of left foot with necrosis of bone Facility Procedures CPT4 Code: [id number] Description: 11043 - DEB MUSC/FASCIA 20 SQ CM/< Modifier: Quantity: 1 CPT4 Code: Description: ICD-10 Diagnosis Description L97.524 Non-pressure chronic ulcer of other part of left foot with necrosis of b Modifier: one Quantity: Physician Procedures CPT4 Code: [id number]: [id number] Description: 11043 - WC PHYS DEBR MUSCLE/FASCIA 20 SQ CM Modifier: Quantity: 1 CPT4 Code: Description: ICD-10 Diagnosis Description L97.524 Non-pressure chronic ulcer of other part of left foot with necrosis of bon Modifier: e Quantity: Electronic Signature(s) Signed: 07/20/2022 3:22:53 PM By: Lenda KelpStone III, Shawntia Mangal PA-C Entered By: Lenda KelpStone III, Locklan Canoy on 07/20/2022 15:22:52

## 2022-07-22 NOTE — Progress Notes (Signed)
[name], [name] ([id number]) Visit Report for 07/20/2022 Arrival Information Details Patient Name: [name], [name] Date of Service: 07/20/2022 11:00 AM Medical Record Number: [id number] Patient Account Number: [id number] Date of Birth/Sex: 04-09-1966 (56 y.o. M) Treating RN: Carlene Coria Primary Care Jaquitta Dupriest: PATIENT, NO Other Clinician: Referring Taeden Geller: Fredirick Maudlin Treating Cary Lothrop/Extender: Skipper Cliche in Treatment: 5 Visit Information History Since Last Visit All ordered tests and consults were completed: No Patient Arrived: Knee Scooter Added or deleted any medications: No Arrival Time: 11:09 Any new allergies or adverse reactions: No Accompanied By: self Had a fall or experienced change in No Transfer Assistance: None activities of daily living that may affect Patient Identification Verified: Yes risk of falls: Secondary Verification Process Completed: Yes Signs or symptoms of abuse/neglect since last visito No Patient Requires Transmission-Based Precautions: No Hospitalized since last visit: No Patient Has Alerts: No Implantable device outside of the clinic excluding No cellular tissue based products placed in the center since last visit: Has Dressing in Place as Prescribed: Yes Pain Present Now: No Electronic Signature(s) Signed: 07/21/2022 6:13:50 PM By: Carlene Coria RN Entered By: Carlene Coria on 07/20/2022 11:13:01 [name] ([id number]) -------------------------------------------------------------------------------- Clinic Level of Care Assessment Details Patient Name: [name], [name] Date of Service: 07/20/2022 11:00 AM Medical Record Number: [id number] Patient Account Number: [id number] Date of Birth/Sex: [birth year] (56 y.o. M) Treating RN: Carlene Coria Primary Care Keisi Eckford: PATIENT, NO Other Clinician: Referring Warden Buffa: Fredirick Maudlin Treating Tyleigh Mahn/Extender: Skipper Cliche in Treatment: 5 Clinic Level of Care Assessment Items TOOL 1 Quantity  Score []  - Use when EandM and Procedure is performed on INITIAL visit 0 ASSESSMENTS - Nursing Assessment / Reassessment []  - General Physical Exam (combine w/ comprehensive assessment (listed just below) when performed on new 0 pt. evals) []  - 0 Comprehensive Assessment (HX, ROS, Risk Assessments, Wounds Hx, etc.) ASSESSMENTS - Wound and Skin Assessment / Reassessment []  - Dermatologic / Skin Assessment (not related to wound area) 0 ASSESSMENTS - Ostomy and/or Continence Assessment and Care []  - Incontinence Assessment and Management 0 []  - 0 Ostomy Care Assessment and Management (repouching, etc.) PROCESS - Coordination of Care []  - Simple Patient / Family Education for ongoing care 0 []  - 0 Complex (extensive) Patient / Family Education for ongoing care []  - 0 Staff obtains Programmer, systems, Records, Test Results / Process Orders []  - 0 Staff telephones HHA, Nursing Homes / Clarify orders / etc []  - 0 Routine Transfer to another Facility (non-emergent condition) []  - 0 Routine Hospital Admission (non-emergent condition) []  - 0 New Admissions / Biomedical engineer / Ordering NPWT, Apligraf, etc. []  - 0 Emergency Hospital Admission (emergent condition) PROCESS - Special Needs []  - Pediatric / Minor Patient Management 0 []  - 0 Isolation Patient Management []  - 0 Hearing / Language / Visual special needs []  - 0 Assessment of Community assistance (transportation, D/C planning, etc.) []  - 0 Additional assistance / Altered mentation []  - 0 Support Surface(s) Assessment (bed, cushion, seat, etc.) INTERVENTIONS - Miscellaneous []  - External ear exam 0 []  - 0 Patient Transfer (multiple staff / Civil Service fast streamer / Similar devices) []  - 0 Simple Staple / Suture removal (25 or less) []  - 0 Complex Staple / Suture removal (26 or more) []  - 0 Hypo/Hyperglycemic Management (do not check if billed separately) []  - 0 Ankle / Brachial Index (ABI) - do not check if billed separately Has the  patient been seen at the hospital within the last three years: Yes Total Score: 0 Level Of Care: ____  [name], [name] ([id number]) Electronic Signature(s) Signed: 07/21/2022 6:13:50 PM By: Yevonne Pax RN Entered By: Yevonne Pax on 07/20/2022 11:45:29 [name], [name] ([id number]) -------------------------------------------------------------------------------- Encounter Discharge Information Details Patient Name: [name] Date of Service: 07/20/2022 11:00 AM Medical Record Number: [id number] Patient Account Number: [id number] Date of Birth/Sex: 09/24/1966 (56 y.o. M) Treating RN: Yevonne Pax Primary Care Dellamae Rosamilia: PATIENT, NO Other Clinician: Referring Amritpal Shropshire: Duanne Guess Treating Saphyra Hutt/Extender: Rowan Blase in Treatment: 5 Encounter Discharge Information Items Post Procedure Vitals Discharge Condition: Stable Temperature (F): 97.9 Ambulatory Status: Knee Scooter Pulse (bpm): 77 Discharge Destination: Home Respiratory Rate (breaths/min): 18 Transportation: Private Auto Blood Pressure (mmHg): 173/94 Accompanied By: self Schedule Follow-up Appointment: Yes Clinical Summary of Care: Electronic Signature(s) Signed: 07/21/2022 6:13:50 PM By: Yevonne Pax RN Entered By: Yevonne Pax on 07/20/2022 11:46:46 [name] ([id number]) -------------------------------------------------------------------------------- Lower Extremity Assessment Details Patient Name: [name] Date of Service: 07/20/2022 11:00 AM Medical Record Number: [id number] Patient Account Number: [id number] Date of Birth/Sex: Jun 02, 1966 (56 y.o. M) Treating RN: Yevonne Pax Primary Care Maryana Pittmon: PATIENT, NO Other Clinician: Referring Jayvier Burgher: Duanne Guess Treating Dimitrious Micciche/Extender: Rowan Blase in Treatment: 5 Edema Assessment Assessed: [Left: No] [Right: No] Edema: [Left: Ye] [Right: s] Calf Left: Right: Point of Measurement: 32 cm From Medial Instep 38 cm Ankle Left: Right: Point of  Measurement: 11 cm From Medial Instep 24 cm Vascular Assessment Pulses: Dorsalis Pedis Palpable: [Left:Yes] Electronic Signature(s) Signed: 07/21/2022 6:13:50 PM By: Yevonne Pax RN Entered By: Yevonne Pax on 07/20/2022 11:19:32 [name] ([id number]) -------------------------------------------------------------------------------- Multi Wound Chart Details Patient Name: [name] Date of Service: 07/20/2022 11:00 AM Medical Record Number: [id number] Patient Account Number: [id number] Date of Birth/Sex: November 10, 1966 (56 y.o. M) Treating RN: Yevonne Pax Primary Care Paraskevi Funez: PATIENT, NO Other Clinician: Referring Joanie Duprey: Duanne Guess Treating Zan Triska/Extender: Rowan Blase in Treatment: 5 Vital Signs Height(in): 70 Pulse(bpm): 77 Weight(lbs): 230 Blood Pressure(mmHg): 173/94 Body Mass Index(BMI): 33 Temperature(F): 97.9 Respiratory Rate(breaths/min): 18 Photos: [N/A:N/A] Wound Location: Left, Plantar Foot N/A N/A Wounding Event: Gradually Appeared N/A N/A Primary Etiology: Diabetic Wound/Ulcer of the Lower N/A N/A Extremity Comorbid History: Type II Diabetes N/A N/A Date Acquired: 12/04/2021 N/A N/A Weeks of Treatment: 5 N/A N/A Wound Status: Open N/A N/A Wound Recurrence: No N/A N/A Pending Amputation on Yes N/A N/A Presentation: Measurements L x W x D (cm) 13x8x0.4 N/A N/A Area (cm) : 81.681 N/A N/A Volume (cm) : 32.673 N/A N/A % Reduction in Area: 18.80% N/A N/A % Reduction in Volume: 35.00% N/A N/A Classification: Grade 4 N/A N/A Exudate Amount: Medium N/A N/A Exudate Type: Serosanguineous N/A N/A Exudate Color: red, brown N/A N/A Granulation Amount: Large (67-100%) N/A N/A Granulation Quality: Pink, Pale, Hyper-granulation N/A N/A Necrotic Amount: Small (1-33%) N/A N/A Exposed Structures: Fascia: Yes N/A N/A Fat Layer (Subcutaneous Tissue): Yes Tendon: Yes Muscle: Yes Bone: Yes Joint: No Epithelialization: None N/A N/A Treatment  Notes Electronic Signature(s) Signed: 07/21/2022 6:13:50 PM By: Yevonne Pax RN Entered By: Yevonne Pax on 07/20/2022 11:19:43 [name] ([id number]) -------------------------------------------------------------------------------- Multi-Disciplinary Care Plan Details Patient Name: [name] Date of Service: 07/20/2022 11:00 AM Medical Record Number: [id number] Patient Account Number: [id number] Date of Birth/Sex: [birth year] (56 y.o. M) Treating RN: Yevonne Pax Primary Care Saralee Bolick: PATIENT, NO Other Clinician: Referring Hoyt Leanos: Duanne Guess Treating Littleton Haub/Extender: Rowan Blase in Treatment: 5 Active Inactive Necrotic Tissue Nursing Diagnoses: Impaired tissue integrity related to necrotic/devitalized tissue Knowledge deficit related to management of necrotic/devitalized tissue Goals: Necrotic/devitalized tissue will be minimized in the wound bed Date  Initiated: 06/28/2022 Target Resolution Date: 06/28/2022 Goal Status: Active Patient/caregiver will verbalize understanding of reason and process for debridement of necrotic tissue Date Initiated: 06/28/2022 Target Resolution Date: 06/28/2022 Goal Status: Active Interventions: Assess patient pain level pre-, during and post procedure and prior to discharge Provide education on necrotic tissue and debridement process Treatment Activities: Excisional debridement : 06/28/2022 Notes: Orientation to the Wound Care Program Nursing Diagnoses: Knowledge deficit related to the wound healing center program Goals: Patient/caregiver will verbalize understanding of the [facility] Date Initiated: 06/28/2022 Target Resolution Date: 06/28/2022 Goal Status: Active Interventions: Provide education on orientation to the wound center Notes: Osteomyelitis Nursing Diagnoses: Infection: osteomyelitis Knowledge deficit related to disease process and management Potential for infection:  osteomyelitis Goals: Diagnostic evaluation for osteomyelitis completed as ordered Date Initiated: 06/28/2022 Target Resolution Date: 06/28/2022 Goal Status: Active Patient/caregiver will verbalize understanding of disease process and disease management Date Initiated: 06/28/2022 Target Resolution Date: 06/28/2022 Goal Status: Active Patient's osteomyelitis will resolve Date Initiated: 06/28/2022 Target Resolution Date: 06/28/2022 [name], [name] ([id number]) Goal Status: Active Signs and symptoms for osteomyelitis will be recognized and promptly addressed Date Initiated: 06/28/2022 Target Resolution Date: 06/28/2022 Goal Status: Active Interventions: Assess for signs and symptoms of osteomyelitis resolution every visit Provide education on osteomyelitis Screen for HBO Notes: Wound/Skin Impairment Nursing Diagnoses: Knowledge deficit related to ulceration/compromised skin integrity Goals: Patient/caregiver will verbalize understanding of skin care regimen Date Initiated: 06/15/2022 Target Resolution Date: 07/16/2022 Goal Status: Active Ulcer/skin breakdown will have a volume reduction of 30% by week 4 Date Initiated: 06/15/2022 Target Resolution Date: 07/16/2022 Goal Status: Active Ulcer/skin breakdown will have a volume reduction of 50% by week 8 Date Initiated: 06/15/2022 Target Resolution Date: 08/16/2022 Goal Status: Active Ulcer/skin breakdown will have a volume reduction of 80% by week 12 Date Initiated: 06/15/2022 Target Resolution Date: 09/15/2022 Goal Status: Active Ulcer/skin breakdown will heal within 14 weeks Date Initiated: 06/15/2022 Target Resolution Date: 10/16/2022 Goal Status: Active Interventions: Assess patient/caregiver ability to obtain necessary supplies Assess patient/caregiver ability to perform ulcer/skin care regimen upon admission and as needed Assess ulceration(s) every visit Notes: Electronic Signature(s) Signed: 07/21/2022 6:13:50 PM By: Carlene Coria  RN Entered By: Carlene Coria on 07/20/2022 11:19:36 [name] ([id number]) -------------------------------------------------------------------------------- Pain Assessment Details Patient Name: [name] Date of Service: 07/20/2022 11:00 AM Medical Record Number: [id number] Patient Account Number: [id number] Date of Birth/Sex: 03-Mar-1966 (56 y.o. M) Treating RN: Carlene Coria Primary Care Antonyo Hinderer: PATIENT, NO Other Clinician: Referring Tadan Shill: Fredirick Maudlin Treating Devin Foskey/Extender: Skipper Cliche in Treatment: 5 Active Problems Location of Pain Severity and Description of Pain Patient Has Paino Yes Site Locations With Dressing Change: Yes Duration of the Pain. Constant / Intermittento Intermittent Rate the pain. Current Pain Level: 3 Worst Pain Level: 5 Least Pain Level: 0 Tolerable Pain Level: 5 Character of Pain Describe the Pain: Shooting, Throbbing Pain Management and Medication Current Pain Management: Medication: Yes Cold Application: No Rest: Yes Massage: No Activity: No T.E.N.S.: No Heat Application: No Leg drop or elevation: No Is the Current Pain Management Adequate: Inadequate How does your wound impact your activities of daily livingo Sleep: No Bathing: No Appetite: No Relationship With Others: No Bladder Continence: No Emotions: No Bowel Continence: No Work: No Toileting: No Drive: No Dressing: No Hobbies: No Electronic Signature(s) Signed: 07/21/2022 6:13:50 PM By: Carlene Coria RN Entered By: Carlene Coria on 07/20/2022 11:14:03 [name] ([id number]) -------------------------------------------------------------------------------- Patient/Caregiver Education Details Patient Name: [name] Date of Service: 07/20/2022 11:00 AM Medical Record Number: [id number]  Patient Account Number: [id number] Date of Birth/Gender: [birth year] (56 y.o. M) Treating RN: Yevonne Pax Primary Care Physician: PATIENT, NO Other Clinician: Referring  Physician: Duanne Guess Treating Physician/Extender: Rowan Blase in Treatment: 5 Education Assessment Education Provided To: Patient Education Topics Provided Infection: Methods: Explain/Verbal Responses: State content correctly Welcome To The W[facility] Center: Methods: Explain/Verbal Responses: State content correctly Wound Debridement: Methods: Explain/Verbal Responses: State content correctly Electronic Signature(s) Signed: 07/21/2022 6:13:50 PM By: Yevonne Pax RN Entered By: Yevonne Pax on 07/20/2022 11:45:58 [name] ([id number]) -------------------------------------------------------------------------------- Wound Assessment Details Patient Name: [name] Date of Service: 07/20/2022 11:00 AM Medical Record Number: [id number] Patient Account Number: [id number] Date of Birth/Sex: [birth year] (56 y.o. M) Treating RN: Yevonne Pax Primary Care Rayyan Burley: PATIENT, NO Other Clinician: Referring Mavric Cortright: Duanne Guess Treating Evian Salguero/Extender: Rowan Blase in Treatment: 5 Wound Status Wound Number: 1 Primary Etiology: Diabetic Wound/Ulcer of the Lower Extremity Wound Location: Left, Plantar Foot Wound Status: Open Wounding Event: Gradually Appeared Comorbid History: Type II Diabetes Date Acquired: 12/04/2021 Weeks Of Treatment: 5 Clustered Wound: No Pending Amputation On Presentation Photos Wound Measurements Length: (cm) 13 Width: (cm) 8 Depth: (cm) 0.4 Area: (cm) 81.681 Volume: (cm) 32.673 % Reduction in Area: 18.8% % Reduction in Volume: 35% Epithelialization: None Tunneling: No Undermining: No Wound Description Classification: Grade 4 F Exudate Amount: Medium S Exudate Type: Serosanguineous Exudate Color: red, brown oul Odor After Cleansing: No lough/Fibrino Yes Wound Bed Granulation Amount: Large (67-100%) Exposed Structure Granulation Quality: Pink, Pale, Hyper-granulation Fascia Exposed: Yes Necrotic Amount: Small  (1-33%) Fat Layer (Subcutaneous Tissue) Exposed: Yes Necrotic Quality: Adherent Slough Tendon Exposed: Yes Muscle Exposed: Yes Necrosis of Muscle: Yes Joint Exposed: No Bone Exposed: Yes Treatment Notes Wound #1 (Foot) Wound Laterality: Plantar, Left Cleanser Dakin 16 (oz) 0.25 Discharge Instruction: Use as directed. [name], [name] ([id number]) Peri-Wound Care Topical Primary Dressing Gauze Discharge Instruction: moistened with 1/4 Dakins Solution Secondary Dressing ABD Pad 5x9 (in/in) Discharge Instruction: Cover with ABD pad Kerlix 4.5 x 4.1 (in/yd) Discharge Instruction: Apply Kerlix 4.5 x 4.1 (in/yd) as instructed Secured With Medipore Tape - 47M Medipore H Soft Cloth Surgical Tape, 2x2 (in/yd) Compression Wrap Compression Stockings Add-Ons Electronic Signature(s) Signed: 07/21/2022 6:13:50 PM By: Yevonne Pax RN Entered By: Yevonne Pax on 07/20/2022 11:18:17 [name] ([id number]) -------------------------------------------------------------------------------- Vitals Details Patient Name: [name] Date of Service: 07/20/2022 11:00 AM Medical Record Number: [id number] Patient Account Number: [id number] Date of Birth/Sex: 03-07-1966 (56 y.o. M) Treating RN: Yevonne Pax Primary Care Stephaney Steven: PATIENT, NO Other Clinician: Referring Danyal Whitenack: Duanne Guess Treating Tadao Emig/Extender: Rowan Blase in Treatment: 5 Vital Signs Time Taken: 11:13 Temperature (F): 97.9 Height (in): 70 Pulse (bpm): 77 Weight (lbs): 230 Respiratory Rate (breaths/min): 18 Body Mass Index (BMI): 33 Blood Pressure (mmHg): 173/94 Reference Range: 80 - 120 mg / dl Electronic Signature(s) Signed: 07/21/2022 6:13:50 PM By: Yevonne Pax RN Entered By: Yevonne Pax on 07/20/2022 11:13:20

## 2022-07-24 ENCOUNTER — Encounter: Payer: 59 | Admitting: Physician Assistant

## 2022-07-24 ENCOUNTER — Telehealth: Payer: Self-pay

## 2022-07-24 DIAGNOSIS — L97524 Non-pressure chronic ulcer of other part of left foot with necrosis of bone: Secondary | ICD-10-CM | POA: Diagnosis not present

## 2022-07-24 DIAGNOSIS — E11621 Type 2 diabetes mellitus with foot ulcer: Secondary | ICD-10-CM | POA: Diagnosis not present

## 2022-07-24 LAB — GLUCOSE, CAPILLARY
Glucose-Capillary: 284 mg/dL — ABNORMAL HIGH (ref 70–99)
Glucose-Capillary: 273 mg/dL — ABNORMAL HIGH (ref 70–99)

## 2022-07-24 NOTE — Progress Notes (Signed)
[name], [name] ([id number]) Visit Report for 07/24/2022 Arrival Information Details Patient Name: [name], [name] Date of Service: 07/24/2022 8:00 AM Medical Record Number: [id number] Patient Account Number: [id number] Date of Birth/Sex: [birth year] (56 y.o. M) Treating RN: Yevonne Pax Primary Care Barry Culverhouse: PATIENT, NO Other Clinician: Izetta Dakin Referring Nitya Cauthon: Duanne Guess Treating Hisham Provence/Extender: Rowan Blase in Treatment: 5 Visit Information History Since Last Visit Added or deleted any medications: No Patient Arrived: Knee Scooter Any new allergies or adverse reactions: No Arrival Time: 08:30 Had a fall or experienced change in No Accompanied By: self activities of daily living that may affect Transfer Assistance: None risk of falls: Patient Identification Verified: Yes Signs or symptoms of abuse/neglect since last visito No Secondary Verification Process Completed: Yes Hospitalized since last visit: No Patient Requires Transmission-Based Precautions: No Implantable device outside of the clinic excluding No Patient Has Alerts: No cellular tissue based products placed in the center since last visit: Has Dressing in Place as Prescribed: Yes Pain Present Now: No Electronic Signature(s) Signed: 07/24/2022 11:40:58 AM By: Aleda Grana RCP, RRT, CHT Entered By: Aleda Grana on 07/24/2022 10:03:33 [name] ([id number]) -------------------------------------------------------------------------------- Encounter Discharge Information Details Patient Name: [name] Date of Service: 07/24/2022 8:00 AM Medical Record Number: [id number] Patient Account Number: [id number] Date of Birth/Sex: 07-May-1966 (56 y.o. M) Treating RN: Yevonne Pax Primary Care Dimitra Woodstock: PATIENT, NO Other Clinician: Izetta Dakin Referring Steven Basso: Duanne Guess Treating Judithe Keetch/Extender: Rowan Blase in Treatment: 5 Encounter Discharge Information  Items Discharge Condition: Stable Ambulatory Status: Knee Scooter Discharge Destination: Home Transportation: Private Auto Accompanied By: self Schedule Follow-up Appointment: No Clinical Summary of Care: Notes Patient has an HBO treatment scheduled on 07/25/22 at 08:00 am. Electronic Signature(s) Signed: 07/24/2022 11:40:58 AM By: Aleda Grana RCP, RRT, CHT Entered By: Aleda Grana on 07/24/2022 11:40:12 [name] ([id number]) -------------------------------------------------------------------------------- Vitals Details Patient Name: [name] Date of Service: 07/24/2022 8:00 AM Medical Record Number: [id number] Patient Account Number: [id number] Date of Birth/Sex: December 31, 1965 (56 y.o. M) Treating RN: Yevonne Pax Primary Care Loye Reininger: PATIENT, NO Other Clinician: Izetta Dakin Referring Loy Mccartt: Duanne Guess Treating Jessy Calixte/Extender: Rowan Blase in Treatment: 5 Vital Signs Time Taken: 08:36 Temperature (F): 97.5 Height (in): 70 Pulse (bpm): 66 Weight (lbs): 230 Respiratory Rate (breaths/min): 16 Body Mass Index (BMI): 33 Blood Pressure (mmHg): 142/90 Capillary Blood Glucose (mg/dl): 242 Reference Range: 80 - 120 mg / dl Electronic Signature(s) Signed: 07/24/2022 11:40:58 AM By: Aleda Grana RCP, RRT, CHT Entered By: Aleda Grana on 07/24/2022 10:04:19

## 2022-07-24 NOTE — Telephone Encounter (Signed)
Patient called office today requesting to schedule appointment with Dr. Rivka Safer for second opinion on left foot osteo. Is currently seeing wound clinic at [facility] and would prefer to see provider there.  Is scheduled for 8/29 at 9. [name], RMA

## 2022-07-25 ENCOUNTER — Encounter: Payer: 59 | Admitting: Physician Assistant

## 2022-07-25 DIAGNOSIS — E11621 Type 2 diabetes mellitus with foot ulcer: Secondary | ICD-10-CM | POA: Diagnosis not present

## 2022-07-25 DIAGNOSIS — L97524 Non-pressure chronic ulcer of other part of left foot with necrosis of bone: Secondary | ICD-10-CM | POA: Diagnosis not present

## 2022-07-25 LAB — GLUCOSE, CAPILLARY
Glucose-Capillary: 324 mg/dL — ABNORMAL HIGH (ref 70–99)
Glucose-Capillary: 286 mg/dL — ABNORMAL HIGH (ref 70–99)

## 2022-07-25 NOTE — Progress Notes (Signed)
KWALI, WRINKLE ([id number]) Visit Report for 07/25/2022 Arrival Information Details Patient Name: [name], [name] Date of Service: 07/25/2022 8:00 AM Medical Record Number: [id number] Patient Account Number: [id number] Date of Birth/Sex: 03-27-1966 (56 y.o. M) Treating RN: Yevonne Pax Primary Care Keyaira Clapham: PATIENT, NO Other Clinician: Izetta Dakin Referring Tondra Reierson: Duanne Guess Treating Aldair Rickel/Extender: Rowan Blase in Treatment: 5 Visit Information History Since Last Visit Added or deleted any medications: No Patient Arrived: Knee Scooter Any new allergies or adverse reactions: No Arrival Time: 09:03 Had a fall or experienced change in No Accompanied By: self activities of daily living that may affect Transfer Assistance: None risk of falls: Patient Identification Verified: Yes Signs or symptoms of abuse/neglect since last visito No Secondary Verification Process Completed: Yes Hospitalized since last visit: No Patient Requires Transmission-Based Precautions: No Implantable device outside of the clinic excluding No Patient Has Alerts: No cellular tissue based products placed in the center since last visit: Has Dressing in Place as Prescribed: Yes Pain Present Now: No Electronic Signature(s) Signed: 07/25/2022 11:01:48 AM By: Aleda Grana RCP, RRT, CHT Entered By: Aleda Grana on 07/25/2022 09:03:47 [name] ([id number]) -------------------------------------------------------------------------------- Encounter Discharge Information Details Patient Name: [name] Date of Service: 07/25/2022 8:00 AM Medical Record Number: [id number] Patient Account Number: [id number] Date of Birth/Sex: Sep 22, 1966 (56 y.o. M) Treating RN: Yevonne Pax Primary Care Briceson Broadwater: PATIENT, NO Other Clinician: Izetta Dakin Referring Trinna Kunst: Duanne Guess Treating Lashea Goda/Extender: Rowan Blase in Treatment: 5 Encounter Discharge Information  Items Discharge Condition: Stable Ambulatory Status: Knee Scooter Discharge Destination: Home Transportation: Private Auto Accompanied By: self Schedule Follow-up Appointment: Yes Clinical Summary of Care: Notes Patient has an HBO treatment scheduled on 07/26/22 at 08:00 am. Electronic Signature(s) Signed: 07/25/2022 11:01:48 AM By: Aleda Grana RCP, RRT, CHT Entered By: Aleda Grana on 07/25/2022 11:01:10 [name] ([id number]) -------------------------------------------------------------------------------- Vitals Details Patient Name: [name] Date of Service: 07/25/2022 8:00 AM Medical Record Number: [id number] Patient Account Number: [id number] Date of Birth/Sex: 1966/07/28 (56 y.o. M) Treating RN: Yevonne Pax Primary Care Zailee Vallely: PATIENT, NO Other Clinician: Izetta Dakin Referring Sheriff Rodenberg: Duanne Guess Treating Noelani Harbach/Extender: Rowan Blase in Treatment: 5 Vital Signs Time Taken: 08:10 Temperature (F): 98.0 Height (in): 70 Pulse (bpm): 72 Weight (lbs): 230 Respiratory Rate (breaths/min): 16 Body Mass Index (BMI): 33 Blood Pressure (mmHg): 138/80 Capillary Blood Glucose (mg/dl): 263 Reference Range: 80 - 120 mg / dl Electronic Signature(s) Signed: 07/25/2022 11:01:48 AM By: Aleda Grana RCP, RRT, CHT Entered By: Aleda Grana on 07/25/2022 09:04:22

## 2022-07-26 ENCOUNTER — Encounter (HOSPITAL_BASED_OUTPATIENT_CLINIC_OR_DEPARTMENT_OTHER): Payer: 59 | Admitting: Internal Medicine

## 2022-07-26 DIAGNOSIS — E11621 Type 2 diabetes mellitus with foot ulcer: Secondary | ICD-10-CM | POA: Diagnosis not present

## 2022-07-26 DIAGNOSIS — M86672 Other chronic osteomyelitis, left ankle and foot: Secondary | ICD-10-CM | POA: Diagnosis not present

## 2022-07-26 DIAGNOSIS — L97524 Non-pressure chronic ulcer of other part of left foot with necrosis of bone: Secondary | ICD-10-CM | POA: Diagnosis not present

## 2022-07-26 LAB — GLUCOSE, CAPILLARY
Glucose-Capillary: 151 mg/dL — ABNORMAL HIGH (ref 70–99)
Glucose-Capillary: 116 mg/dL — ABNORMAL HIGH (ref 70–99)

## 2022-07-26 NOTE — Progress Notes (Signed)
SIXTO, BOWDISH ([id number]) Visit Report for 07/26/2022 Arrival Information Details Patient Name: [name], [name] Date of Service: 07/26/2022 8:00 AM Medical Record Number: [id number] Patient Account Number: [id number] Date of Birth/Sex: 1966/02/24 (56 y.o. M) Treating RN: Yevonne Pax Primary Care Bertine Schlottman: PATIENT, NO Other Clinician: Izetta Dakin Referring Raphael Espe: Duanne Guess Treating Scotti Motter/Extender: Tilda Franco in Treatment: 5 Visit Information History Since Last Visit Added or deleted any medications: No Patient Arrived: Knee Scooter Any new allergies or adverse reactions: No Arrival Time: 07:52 Had a fall or experienced change in No Accompanied By: self activities of daily living that may affect Transfer Assistance: None risk of falls: Patient Identification Verified: Yes Signs or symptoms of abuse/neglect since last visito No Secondary Verification Process Completed: Yes Hospitalized since last visit: No Patient Requires Transmission-Based Precautions: No Implantable device outside of the clinic excluding No Patient Has Alerts: No cellular tissue based products placed in the center since last visit: Has Dressing in Place as Prescribed: Yes Pain Present Now: No Electronic Signature(s) Signed: 07/26/2022 1:27:19 PM By: Aleda Grana RCP, RRT, CHT Entered By: Aleda Grana on 07/26/2022 08:51:59 [name] ([id number]) -------------------------------------------------------------------------------- Encounter Discharge Information Details Patient Name: [name] Date of Service: 07/26/2022 8:00 AM Medical Record Number: [id number] Patient Account Number: [id number] Date of Birth/Sex: [birth year] (56 y.o. M) Treating RN: Yevonne Pax Primary Care Jonahtan Manseau: PATIENT, NO Other Clinician: Izetta Dakin Referring Kathern Lobosco: Duanne Guess Treating Thersia Petraglia/Extender: Tilda Franco in Treatment: 5 Encounter Discharge Information  Items Discharge Condition: Stable Ambulatory Status: Knee Scooter Discharge Destination: Home Transportation: Private Auto Accompanied By: self Schedule Follow-up Appointment: Yes Clinical Summary of Care: Notes Patient has an HBO treatment scheduled on 07/27/22 at 08:00 am. Electronic Signature(s) Signed: 07/26/2022 1:27:19 PM By: Aleda Grana RCP, RRT, CHT Entered By: Aleda Grana on 07/26/2022 11:57:02 [name] ([id number]) -------------------------------------------------------------------------------- Vitals Details Patient Name: [name] Date of Service: 07/26/2022 8:00 AM Medical Record Number: [id number] Patient Account Number: [id number] Date of Birth/Sex: 1966/08/19 (56 y.o. M) Treating RN: Yevonne Pax Primary Care Brittay Mogle: PATIENT, NO Other Clinician: Izetta Dakin Referring Lexianna Weinrich: Duanne Guess Treating Philo Kurtz/Extender: Tilda Franco in Treatment: 5 Vital Signs Time Taken: 07:55 Temperature (F): 97.9 Height (in): 70 Pulse (bpm): 78 Weight (lbs): 230 Respiratory Rate (breaths/min): 16 Body Mass Index (BMI): 33 Blood Pressure (mmHg): 124/68 Capillary Blood Glucose (mg/dl): 338 Reference Range: 80 - 120 mg / dl Electronic Signature(s) Signed: 07/26/2022 1:27:19 PM By: Aleda Grana RCP, RRT, CHT Entered By: Aleda Grana on 07/26/2022 08:52:02

## 2022-07-26 NOTE — Progress Notes (Signed)
[name], [name] ([id number]) Visit Report for 07/25/2022 HBO Details Patient Name: [name], [name] Date of Service: 07/25/2022 8:00 AM Medical Record Number: [id number] Patient Account Number: [id number] Date of Birth/Sex: 1966-04-29 (56 y.o. M) Treating RN: Yevonne Pax Primary Care Isbella Arline: PATIENT, NO Other Clinician: Izetta Dakin Referring Crestina Strike: Duanne Guess Treating Lois Slagel/Extender: Rowan Blase in Treatment: 5 HBO Treatment Course Details Treatment Course Number: 1 Ordering Amorah Sebring: Allen Derry Total Treatments Ordered: 40 HBO Treatment Start Date: 06/19/2022 HBO Indication: Diabetic Ulcer(s) of the Lower Extremity Standard/Conservative Wound Care tried and failed greater than or equal to 30 days Wound #1 Plantar Foot HBO Treatment Details Treatment Number: 3 Patient Type: Outpatient Chamber Type: Monoplace Chamber Serial #: [id number] Treatment Protocol: 2.5 ATA with 90 minutes oxygen, with two 5 minute air breaks Treatment Details Compression Rate Down: 1.5 psi / minute De-Compression Rate Up: 1.5 psi / minute Compress Tx Pressure Air breaks and breathing periods Decompress Decompress Begins Reached (leave unused spaces blank) Begins Ends Chamber Pressure (ATA) 1 2.5 2.5 2.5 2.5 2.5 - - 2.5 1 Clock Time (24 hr) 08:19 08:29 - - - - - - 10:00 10:10 Treatment Length: 111 (minutes) Treatment Segments: 4 Vital Signs Capillary Blood Glucose Reference Range: 80 - 120 mg / dl HBO Diabetic Blood Glucose Intervention Range: <131 mg/dl or >254 mg/dl Time Vitals Blood Respiratory Capillary Blood Glucose Pulse Action Type: Pulse: Temperature: Taken: Pressure: Rate: Glucose (mg/dl): Meter #: Oximetry (%) Taken: Pre 08:10 138/80 72 16 98 324 1 none per protocol Post 10:14 122/80 84 16 97.7 286 1 none per protocol Treatment Response Treatment Toleration: Well Treatment Completion Treatment Completed without Adverse Event Status: Additional Procedure  Documentation Tissue Sevierity: Necrosis of bone HBO Attestation I certify that I supervised this HBO treatment in accordance with Medicare guidelines. A trained emergency response team is readily Yes available per hospital policies and procedures. Continue HBOT as ordered. Yes Electronic Signature(s) Signed: 07/25/2022 6:19:00 PM By: Lenda Kelp PA-C Previous Signature: 07/25/2022 11:01:48 AM Version By: Aleda Grana RCP, RRT, CHT Entered By: Lenda Kelp on 07/25/2022 18:19:00 [name], [name] ([id number]) [name], [name] ([id number]) -------------------------------------------------------------------------------- HBO Safety Checklist Details Patient Name: [name] Date of Service: 07/25/2022 8:00 AM Medical Record Number: [id number] Patient Account Number: [id number] Date of Birth/Sex: [birth year] (56 y.o. M) Treating RN: Yevonne Pax Primary Care Kalon Erhardt: PATIENT, NO Other Clinician: Izetta Dakin Referring Nechemia Chiappetta: Duanne Guess Treating Paetyn Pietrzak/Extender: Rowan Blase in Treatment: 5 HBO Safety Checklist Items Safety Checklist Consent Form Signed Patient voided / foley secured and emptied When did you last eato 07:00 am Last dose of injectable or oral agent 07/24/22 Ostomy pouch emptied and vented if applicable NA All implantable devices assessed, documented and approved NA Intravenous access site secured and place NA Valuables secured Linens and cotton and cotton/polyester blend (less than 51% polyester) Personal oil-based products / skin lotions / body lotions removed Wigs or hairpieces removed NA Smoking or tobacco materials removed NA Books / newspapers / magazines / loose paper removed NA Cologne, aftershave, perfume and deodorant removed Jewelry removed (may wrap wedding band) Make-up removed NA Hair care products removed Battery operated devices (external) removed NA Heating patches and chemical warmers removed NA Titanium eyewear  removed NA Nail polish cured greater than 10 hours NA Casting material cured greater than 10 hours NA Hearing aids removed NA Loose dentures or partials removed NA Prosthetics have been removed NA Patient demonstrates correct use of air break device (if applicable) Patient concerns have been addressed  Patient grounding bracelet on and cord attached to chamber Specifics for Inpatients (complete in addition to above) Medication sheet sent with patient Intravenous medications needed or due during therapy sent with patient Drainage tubes (e.g. nasogastric tube or chest tube secured and vented) Endotracheal or Tracheotomy tube secured Cuff deflated of air and inflated with saline Airway suctioned Electronic Signature(s) Signed: 07/25/2022 11:01:48 AM By: Aleda Grana RCP, RRT, CHT Entered By: Aleda Grana on 07/25/2022 09:06:10

## 2022-07-26 NOTE — Progress Notes (Signed)
[name], [name] ([id number]) Visit Report for 07/24/2022 HBO Details Patient Name: [name], [name] Date of Service: 07/24/2022 8:00 AM Medical Record Number: [id number] Patient Account Number: [id number] Date of Birth/Sex: June 07, 1966 (56 y.o. M) Treating RN: Yevonne Pax Primary Care Mukhtar Shams: PATIENT, NO Other Clinician: Izetta Dakin Referring Derinda Bartus: Duanne Guess Treating Chirstina Haan/Extender: Rowan Blase in Treatment: 5 HBO Treatment Course Details Treatment Course Number: 1 Ordering Shawn Carattini: Allen Derry Total Treatments Ordered: 40 HBO Treatment Start Date: 06/19/2022 HBO Indication: Diabetic Ulcer(s) of the Lower Extremity Standard/Conservative Wound Care tried and failed greater than or equal to 30 days Wound #1 Plantar Foot HBO Treatment Details Treatment Number: 2 Patient Type: Outpatient Chamber Type: Monoplace Chamber Serial #: [id number] Treatment Protocol: 2.5 ATA with 90 minutes oxygen, with two 5 minute air breaks Treatment Details Compression Rate Down: 1.5 psi / minute De-Compression Rate Up: 1.5 psi / minute Compress Tx Pressure Air breaks and breathing periods Decompress Decompress Begins Reached (leave unused spaces blank) Begins Ends Chamber Pressure (ATA) 1 2.5 2.5 2.5 2.5 2.5 - - 2.5 1 Clock Time (24 hr) 08:48 08:58 - - - - - - 10:28 10:37 Treatment Length: 109 (minutes) Treatment Segments: 4 Vital Signs Capillary Blood Glucose Reference Range: 80 - 120 mg / dl HBO Diabetic Blood Glucose Intervention Range: <131 mg/dl or >824 mg/dl Time Vitals Blood Respiratory Capillary Blood Glucose Pulse Action Type: Pulse: Temperature: Taken: Pressure: Rate: Glucose (mg/dl): Meter #: Oximetry (%) Taken: Pre 08:36 142/90 66 16 97.5 284 1 none per protocol Post 10:46 122/84 78 18 97.7 273 1 none per protocol Treatment Response Treatment Toleration: Well Treatment Completion Treatment Completed without Adverse Event Status: Additional Procedure  Documentation Tissue Sevierity: Necrosis of bone HBO Attestation I certify that I supervised this HBO treatment in accordance with Medicare guidelines. A trained emergency response team is readily Yes available per hospital policies and procedures. Continue HBOT as ordered. Yes Electronic Signature(s) Signed: 07/25/2022 6:19:28 PM By: Lenda Kelp PA-C Previous Signature: 07/24/2022 11:40:58 AM Version By: Aleda Grana RCP, RRT, CHT Entered By: Lenda Kelp on 07/25/2022 18:19:28 [name], [name] ([id number]) [name], [name] ([id number]) -------------------------------------------------------------------------------- HBO Safety Checklist Details Patient Name: [name] Date of Service: 07/24/2022 8:00 AM Medical Record Number: [id number] Patient Account Number: [id number] Date of Birth/Sex: 1966-02-12 (56 y.o. M) Treating RN: Yevonne Pax Primary Care Asja Frommer: PATIENT, NO Other Clinician: Izetta Dakin Referring Torin Modica: Duanne Guess Treating Azim Gillingham/Extender: Rowan Blase in Treatment: 5 HBO Safety Checklist Items Safety Checklist Consent Form Signed Patient voided / foley secured and emptied When did you last eato am Last dose of injectable or oral agent 07/23/22 Ostomy pouch emptied and vented if applicable NA All implantable devices assessed, documented and approved NA Intravenous access site secured and place NA Valuables secured Linens and cotton and cotton/polyester blend (less than 51% polyester) Personal oil-based products / skin lotions / body lotions removed Wigs or hairpieces removed NA Smoking or tobacco materials removed NA Books / newspapers / magazines / loose paper removed Cologne, aftershave, perfume and deodorant removed Jewelry removed (may wrap wedding band) Make-up removed NA Hair care products removed Battery operated devices (external) removed NA Heating patches and chemical warmers removed NA Titanium eyewear  removed NA Nail polish cured greater than 10 hours NA Casting material cured greater than 10 hours NA Hearing aids removed NA Loose dentures or partials removed NA Prosthetics have been removed NA Patient demonstrates correct use of air break device (if applicable) Patient concerns have been addressed Patient grounding  bracelet on and cord attached to chamber Specifics for Inpatients (complete in addition to above) Medication sheet sent with patient Intravenous medications needed or due during therapy sent with patient Drainage tubes (e.g. nasogastric tube or chest tube secured and vented) Endotracheal or Tracheotomy tube secured Cuff deflated of air and inflated with saline Airway suctioned Electronic Signature(s) Signed: 07/24/2022 11:40:58 AM By: Aleda Grana RCP, RRT, CHT Entered By: Aleda Grana on 07/24/2022 10:09:29

## 2022-07-26 NOTE — Progress Notes (Signed)
HUZAIFA, VINEY ([id number]) Visit Report for 07/26/2022 HBO Details Patient Name: [name], [name] Date of Service: 07/26/2022 8:00 AM Medical Record Number: [id number] Patient Account Number: [id number] Date of Birth/Sex: 1966/03/25 (56 y.o. M) Treating RN: Yevonne Pax Primary Care Kapena Hamme: PATIENT, NO Other Clinician: Izetta Dakin Referring Meria Crilly: Duanne Guess Treating Amaiyah Nordhoff/Extender: Tilda Franco in Treatment: 5 HBO Treatment Course Details Treatment Course Number: 1 Ordering Clif Serio: Allen Derry Total Treatments Ordered: 40 HBO Treatment Start Date: 06/19/2022 HBO Indication: Diabetic Ulcer(s) of the Lower Extremity Standard/Conservative Wound Care tried and failed greater than or equal to 30 days Wound #1 Plantar Foot HBO Treatment Details Treatment Number: 4 Patient Type: Outpatient Chamber Type: Monoplace Chamber Serial #: [id number] Treatment Protocol: 2.5 ATA with 90 minutes oxygen, with two 5 minute air breaks Treatment Details Compression Rate Down: 1.5 psi / minute De-Compression Rate Up: 1.5 psi / minute Compress Tx Pressure Air breaks and breathing periods Decompress Decompress Begins Reached (leave unused spaces blank) Begins Ends Chamber Pressure (ATA) 1 2.5 2.5 2.5 2.5 2.5 - - 2.5 1 Clock Time (24 hr) 08:29 08:39 - - - - - - 10:10 10:20 Treatment Length: 111 (minutes) Treatment Segments: 4 Vital Signs Capillary Blood Glucose Reference Range: 80 - 120 mg / dl HBO Diabetic Blood Glucose Intervention Range: <131 mg/dl or >644 mg/dl Time Vitals Blood Respiratory Capillary Blood Glucose Pulse Action Type: Pulse: Temperature: Taken: Pressure: Rate: Glucose (mg/dl): Meter #: Oximetry (%) Taken: Pre 07:55 124/68 78 16 97.9 151 1 none per protocol Post 10:27 124/68 72 16 97.6 116 1 none per protocol Treatment Response Treatment Toleration: Well Treatment Completion Treatment Completed without Adverse Event Status: HBO Attestation I certify that  I supervised this HBO treatment in accordance with Medicare guidelines. A trained emergency response team is readily Yes available per hospital policies and procedures. Continue HBOT as ordered. Yes Electronic Signature(s) Signed: 07/26/2022 1:39:42 PM By: Geralyn Corwin DO Previous Signature: 07/26/2022 1:27:19 PM Version By: Aleda Grana RCP, RRT, CHT Previous Signature: 07/26/2022 9:29:56 AM Version By: Geralyn Corwin DO Entered By: Geralyn Corwin on 07/26/2022 13:32:59 [name] ([id number]) -------------------------------------------------------------------------------- HBO Safety Checklist Details Patient Name: [name] Date of Service: 07/26/2022 8:00 AM Medical Record Number: [id number] Patient Account Number: [id number] Date of Birth/Sex: 03-Aug-1966 (56 y.o. M) Treating RN: Yevonne Pax Primary Care Maricia Scotti: PATIENT, NO Other Clinician: Izetta Dakin Referring Kealy Lewter: Duanne Guess Treating Chrystina Naff/Extender: Tilda Franco in Treatment: 5 HBO Safety Checklist Items Safety Checklist Consent Form Signed Patient voided / foley secured and emptied When did you last eato 07:00 am Last dose of injectable or oral agent 07/25/22 Ostomy pouch emptied and vented if applicable NA All implantable devices assessed, documented and approved NA Intravenous access site secured and place NA Valuables secured Linens and cotton and cotton/polyester blend (less than 51% polyester) Personal oil-based products / skin lotions / body lotions removed Wigs or hairpieces removed NA Smoking or tobacco materials removed NA Books / newspapers / magazines / loose paper removed Cologne, aftershave, perfume and deodorant removed Jewelry removed (may wrap wedding band) Make-up removed NA Hair care products removed Battery operated devices (external) removed NA Heating patches and chemical warmers removed NA Titanium eyewear removed NA Nail polish cured  greater than 10 hours NA Casting material cured greater than 10 hours NA Hearing aids removed NA Loose dentures or partials removed NA Prosthetics have been removed NA Patient demonstrates correct use of air break device (if applicable) Patient concerns have been addressed Patient grounding bracelet on  and cord attached to chamber Specifics for Inpatients (complete in addition to above) Medication sheet sent with patient Intravenous medications needed or due during therapy sent with patient Drainage tubes (e.g. nasogastric tube or chest tube secured and vented) Endotracheal or Tracheotomy tube secured Cuff deflated of air and inflated with saline Airway suctioned Electronic Signature(s) Signed: 07/26/2022 1:27:19 PM By: Aleda Grana RCP, RRT, CHT Entered By: Aleda Grana on 07/26/2022 08:53:17

## 2022-07-26 NOTE — Progress Notes (Signed)
JANN, RA ([id number]) Visit Report for 07/25/2022 Problem List Details Patient Name: [name], [name] Date of Service: 07/25/2022 8:00 AM Medical Record Number: [id number] Patient Account Number: [id number] Date of Birth/Sex: 1966/03/10 (56 y.o. 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 M) Treating RN: Yevonne Pax Primary Care Provider: PATIENT, NO Other Clinician: Izetta Dakin Referring Provider: Duanne Guess Treating Provider/Extender: Rowan Blase in Treatment: 5 Diagnosis Coding ICD-10 Codes Code Description [phone] Other chronic osteomyelitis, left ankle and foot E11.621 Type 2 diabetes mellitus with foot ulcer L97.524 Non-pressure chronic ulcer of other part of left foot with necrosis of bone Facility Procedures CPT4 Code: 62376283 Description: (Facility Use Only) HBOT, full body chamber, Modifier: Quantity: 4 Physician Procedures CPT4 Code: 1517616 Description: 07371 - WC PHYS HYPERBARIC OXYGEN THERAPY Modifier: Quantity: 1 CPT4  Code: Description: ICD-10 Diagnosis Description M86.672 Other chronic osteomyelitis, left ankle and foot L97.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

## 2022-07-26 NOTE — Progress Notes (Signed)
CARLYN, MULLENBACH ([id number]) Visit Report for 07/24/2022 Problem List Details Patient Name: [name], [name] Date of Service: 07/24/2022 8:00 AM Medical Record Number: [id number] Patient Account Number: [id number] Date of Birth/Sex: 1966-08-18 (56 y.o. 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 M) Treating RN: Yevonne Pax Primary Care Provider: PATIENT, NO Other Clinician: Izetta Dakin Referring Provider: Duanne Guess Treating Provider/Extender: Rowan Blase in Treatment: 5 Diagnosis Coding ICD-10 Codes Code Description [phone] Other chronic osteomyelitis, left ankle and foot E11.621 Type 2 diabetes mellitus with foot ulcer L97.524 Non-pressure chronic ulcer of other part of left foot with necrosis of bone Facility Procedures CPT4 Code: 48546270 Description: (Facility Use Only) HBOT, full body chamber, Modifier: Quantity: 4 Physician Procedures CPT4 Code: 3500938 Description: 18299 - WC PHYS HYPERBARIC OXYGEN THERAPY Modifier: Quantity: 1 CPT4  Code: Description: ICD-10 Diagnosis Description M86.672 Other chronic osteomyelitis, left ankle and foot E11.621 Type 2 diabetes mellitus with foot ulcer L97.524 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

## 2022-07-27 ENCOUNTER — Encounter: Payer: 59 | Admitting: Physician Assistant

## 2022-07-27 DIAGNOSIS — L97524 Non-pressure chronic ulcer of other part of left foot with necrosis of bone: Secondary | ICD-10-CM | POA: Diagnosis not present

## 2022-07-27 DIAGNOSIS — E11621 Type 2 diabetes mellitus with foot ulcer: Secondary | ICD-10-CM | POA: Diagnosis not present

## 2022-07-27 LAB — GLUCOSE, CAPILLARY
Glucose-Capillary: 216 mg/dL — ABNORMAL HIGH (ref 70–99)
Glucose-Capillary: 256 mg/dL — ABNORMAL HIGH (ref 70–99)

## 2022-07-27 NOTE — Progress Notes (Addendum)
DETAVIUS, BLOXSOM ([id number]) Visit Report for 07/27/2022 Chief Complaint Document Details Patient Name: [name], [name] Date of Service: 07/27/2022 11:00 AM Medical Record Number: [id number] Patient Account Number: [id number] Date of Birth/Sex: June 02, 1966 (56 y.o. 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 M) Treating RN: Carlene Coria Primary Care Provider: PATIENT, NO Other Clinician: Referring Provider: Fredirick Maudlin Treating Provider/Extender: Skipper Cliche in Treatment: 6 History of Present Illness HPI Description: Notes from [facility] under Dr. Glenford Peers care:   ADMISSION6/15/2023This is a 60 year old poorly controlled type II diabetic (last A1c 11.5%). In January of this year, he presented to the hospital with an abscess in his left foot. He also had a foreign body present. It was removed in the ER and he was admitted to the hospital for IV antibiotics. He was subsequently discharged on oral antibiotics. Due to financial concerns, he has not taken his diabetic medications (metformin and 70/30 insulin) and as a result, presented back to the emergency room on June 1 with worsening findings, including frank pus and osteomyelitis on MRI. BKA was recommended, but he declined. He was taken to the  operating room by Dr. Sharol Given who performed an extensive debridement. Cultures were taken and he was placed on IV antibiotics. He saw infectious disease while in the hospital who prescribed a 6-week course of IV antibiotics including cefazolin as well as oral metronidazole and levofloxacin. The infectious disease provider also indicated that he would benefit from below-knee amputation, but the patient desired another opinion and therefore he has been referred to wound care center for further evaluation and management. ABIs obtained while he was in the hospital demonstrate adequate blood flow. Essentially the entire bottom of the patient's left foot has been removed. The heads of the majority of his metatarsal bones are exposed along with their accompanying tendons. Muscle and fat are exposed as well. The wound surface is fairly dry; the patient reports that he was not given any specific wound care instructions other than to place a dry dressing on it after showering. There is no odor or purulent drainage identified. 05/26/2022: The wound measures a little bit smaller today. There is some good granulation tissue beginning to form on the surface. He still has exposed bone and tendon. There is some slough accumulation. He saw infectious disease earlier this week and was frustrated that they recommended amputation. He is interested in another surgical opinion. He continues to smoke but says that he has cut down. He is using a knee scooter for mobility so that he can stay off of his foot. 06/05/2022: He saw Dr. Sharol Given last week who was supportive of our efforts to try to salvage the foot. I referred him to vascular surgery to see Dr. Carlis Abbott but he has not received an appointment yet. He continues on IV antibiotics. He says that after our conversation last week about him being unable to initiate hyperbarics if he was going to continue to smoke, he has not had a cigarette since. He is now at 4 weeks of  conventional management and his insurance was initiated on July 1. Today, the wound continues to show evidence of improvement. There is good granulation tissue forming. The metatarsals are still exposed but actually have some pink periosteum. There is still some slough and fibrinous exudate present. No odor or purulent drainage. We have just been using Dakin's dressing changes for now. [facility] at [facility]: 06-15-2022 upon evaluation today patient presents for initial inspection here in our clinic though he has been seen by Dr. Lady Gary in [place] up to this point. I did review her notes and records as well they have been using Dakin's moistened gauze dressing which has done a great [name] at helping to clean out the bottom of his foot. Please see the discourse above for additional information in regard to treatment course up until the patient arrives here in the [place] clinic today. The patient is ready to get started with HBO therapy as soon as possible I do think based on all my review of his records this seems to be appropriate he does have chronic osteomyelitis is also been offered amputation this is obviously a limb salvage operation here. We are trying to prevent him from having to undergo an extensive amputation which would likely be a below-knee amputation which in turn is going to affect his quality of life he wants to try to avoid that if at all possible I think hyperbaric oxygen therapy is his best bet to achieve that goal. Currently he has been on IV Ancef. That actually ends tomorrow. Following that according to notes it appears he supposed to be taking Levaquin and Flagyl for an additional period of time although I am not certain exactly how long that with the. He has been seeing regional physicians infectious disease in [place] and again that so I recommended that he contact today in order to find out what the plan is going forward. His most recent hemoglobin A1c  as noted above was 11.5 on 05-07-2022 he tells me trying to work on that he is also quit smoking as of this point. 06-22-2022 upon evaluation today patient appears to be doing well currently in regard to his wound all things considered. He is showing signs of a lot of new granulation tissue and overall very pleased. He is changing this once a day with the Dakin's moistened gauze dressing. Fortunately I do not see any evidence of infection locally or systemically at this time. We have been trying to get him into the hyperbaric chamber he was having a lot of issues with sinus pressure and we have made referral and called multiple ENT specialist the earliest we have been able to get his August 9 at [facility] ear nose throat. They are going to try to work him in sooner if they have any opportunities to do so. Obviously I think the sooner he can do this the better. We need to get him in hyperbarics as quickly as possible. 7/26; patient presents for follow-up. He has been using Dakin's wet-to-dry dressings and oil emollient dressing over the exposed bone. He currently denies systemic signs of infection. He states he is scheduled to see infectious disease, Dr Thedore Mins next week. She had ordered an MRI of the left foot and he is scheduled to have this done tomorrow. He currently denies systemic signs of infection Including fever/chills, nausea/vomiting increased warmth or erythema to the wound bed or purulent drainage. He started hyperbaric oxygen treatment however did not tolerate this due to sinus pressure.  He is scheduled to see ENT in 2 weeks for evaluation. He is not continuing HBO until he is cleared by ENT. 07-06-2022 patient presents today for follow-up concerning his plantar foot ulcer. The good news is this actually is significantly better compared to previous. Some of the bone which is necrotic is starting to slough off and I am going to perform debridement to clear this away today. Nonetheless I do think  that the patient is going to need hyperbarics and we need to try to get it as soon as possible. He still having a lot of sinus pressure I can even hear the congestion today. He is on 3 different antibiotics cefadroxil, metronidazole, and Levaquin currently for the osteomyelitis. For that reason this should actually help with the infection if he does have a chronic sinusitis I am also going to see about getting him on steroids to try to see if this can benefit him as well. He voiced understanding and he is in agreement with giving that shot. This is probably can to spike his blood sugars which I discussed with him he is going to be seeing his primary care provider later today I want him to let her know [name], [name] ([id number]) as well what is going on and why so that she could be aware but I really do think he needs to take the prednisone. We need to get him in the chamber as soon as possible to try to help with getting this wound to heal and closed so that he does not lose his foot. 07-13-2022 upon evaluation today patient appears to be doing better in regard to his wound he continues to show signs of new granulation am going to perform some debridement today but overall this is doing quite well. 07-20-2022 upon evaluation today patient's wound is actually showing some signs of improvement he does have 1 area on the fifth metatarsal region where there is some necrotic tendon that is noted at this point. Unfortunately this is preventing good granulation and over this point the rest of the metatarsal region is completely covered over with granulation tissue which is great news there is some other slough and biofilm buildup that I will clearway as well. 07-27-2022 upon evaluation today patient appears to be doing well currently in regard to his plantar foot ulcer. He has been tolerating the dressing changes without complication fortunately. I do believe he is making excellent progress and each time I see him  this is better and better is pretty much completely covered as far as any bone exposure is concerned and he still is on the IV antibiotics were still also undergoing hyperbarics at this point that he just got started and is doing quite well with since getting the tubes in his ears. Electronic Signature(s) Signed: 07/27/2022 1:16:50 PM By: Worthy Keeler PA-C Entered By: Worthy Keeler on 07/27/2022 13:16:50 [name] ([id number]) -------------------------------------------------------------------------------- Physical Exam Details Patient Name: [name] Date of Service: 07/27/2022 11:00 AM Medical Record Number: [id number] Patient Account Number: [id number] Date of Birth/Sex: 1966-11-02 (56 y.o. M) Treating RN: Carlene Coria Primary Care Provider: PATIENT, NO Other Clinician: Referring Provider: Fredirick Maudlin Treating Provider/Extender: Skipper Cliche in Treatment: 6 Constitutional Well-nourished and well-hydrated in no acute distress. Respiratory normal breathing without difficulty. Psychiatric this patient is able to make decisions and demonstrates good insight into disease process. Alert and Oriented x 3. pleasant and cooperative. Notes Upon inspection patient's wound bed actually showed signs of good granulation and epithelization at  this point. Fortunately there does not appear to be any evidence of infection at this time which is great news and overall I am extremely pleased With where we stand today. Electronic Signature(s) Signed: 07/27/2022 1:17:16 PM By: Lenda Kelp PA-C Entered By: Lenda Kelp on 07/27/2022 13:17:16 [name] ([id number]) -------------------------------------------------------------------------------- Physician Orders Details Patient Name: [name] Date of Service: 07/27/2022 11:00 AM Medical Record Number: [id number] Patient Account Number: [id number] Date of Birth/Sex: Nov 03, 1966 (56 y.o. M) Treating RN: Yevonne Pax Primary Care  Provider: PATIENT, NO Other Clinician: Referring Provider: Duanne Guess Treating Provider/Extender: Rowan Blase in Treatment: 6 Verbal / Phone Orders: No Diagnosis Coding ICD-10 Coding Code Description [phone] Other chronic osteomyelitis, left ankle and foot E11.621 Type 2 diabetes mellitus with foot ulcer L97.524 Non-pressure chronic ulcer of other part of left foot with necrosis of bone Follow-up Appointments o Return Appointment in 1 week. Bathing/ Shower/ Hygiene o May shower; gently cleanse wound with antibacterial soap, rinse and pat dry prior to dressing wounds Anesthetic (Use 'Patient Medications' Section for Anesthetic Order Entry) o Lidocaine applied to wound bed Edema Control - Lymphedema / Segmental Compressive Device / Other o Elevate, Exercise Daily and Avoid Standing for Long Periods of Time. o Elevate legs to the level of the heart and pump ankles as often as possible o Elevate leg(s) parallel to the floor when sitting. Hyperbaric Oxygen Therapy o Evaluate for HBO Therapy o Indication and location: - osteomyelitis left foot o 2.5 ATA for 90 Minutes with 2 Five (5) Minute Air Breaks - total 40 treatments o Total # of Treatments: - 40 o Antihistamine 30 minutes prior to HBO Treatment, difficulty clearing ears. o Finger stick Blood Glucose Pre- and Post- HBOT Treatment. o Follow Hyperbaric Oxygen Glycemia Protocol Wound Treatment Wound #1 - Foot Wound Laterality: Plantar, Left Cleanser: Dakin 16 (oz) 0.25 1 x Per Day/30 Days Discharge Instructions: Use as directed. Primary Dressing: Gauze (Generic) 1 x Per Day/30 Days Discharge Instructions: moistened with 1/4 Dakins Solution Secondary Dressing: ABD Pad 5x9 (in/in) (Generic) 1 x Per Day/30 Days Discharge Instructions: Cover with ABD pad Secondary Dressing: Kerlix 4.5 x 4.1 (in/yd) (Generic) 1 x Per Day/30 Days Discharge Instructions: Apply Kerlix 4.5 x 4.1 (in/yd) as  instructed Secured With: Medipore Tape - 78M Medipore H Soft Cloth Surgical Tape, 2x2 (in/yd) (Generic) 1 x Per Day/30 Days GLYCEMIA INTERVENTIONS PROTOCOL PRE-HBO GLYCEMIA INTERVENTIONS ACTION INTERVENTION Obtain pre-HBO capillary blood glucose (ensure 1 physician order is in chart). 2 If result is 70 mg/dl or below: A. Notify HBO physician and await physician orders. [name], [name] ([id number]) B. If the result meets the hospital definition of a critical result, follow hospital policy. A. Give patient an 8 ounce Glucerna Shake, an 8 ounce Ensure, or 8 ounces of a Glucerna/Ensure equivalent dietary supplement*. B. Wait 30 minutes. If result is 71 mg/dl to 102 mg/dl: C. Retest patientos capillary blood glucose (CBG). D. If result greater than or equal to 110 mg/dl, proceed with HBO. If result less than 110 mg/dl, notify HBO physician and consider holding HBO. If result is 131 mg/dl to 725 mg/dl: A. Proceed with HBO. A. Notify HBO physician and await physician orders. B. It is recommended to hold HBO and do If result is 250 mg/dl or greater: blood/urine ketone testing. C. If the result meets the hospital definition of a critical result, follow hospital policy. POST-HBO GLYCEMIA INTERVENTIONS ACTION INTERVENTION Obtain post HBO capillary blood glucose (ensure 1 physician order is in chart). A.  Notify HBO physician and await physician orders. 2 If result is 70 mg/dl or below: B. If the result meets the hospital definition of a critical result, follow hospital policy. A. Give patient an 8 ounce Glucerna Shake, an 8 ounce Ensure, or 8 ounces of a Glucerna/Ensure equivalent dietary supplement*. B. Wait 15 minutes for symptoms of hypoglycemia (i.e. nervousness, anxiety, If result is 71 mg/dl to 100 mg/dl: sweating, chills, clamminess, irritability, confusion, tachycardia or dizziness). C. If patient asymptomatic, discharge patient. If patient symptomatic, repeat  capillary blood glucose (CBG) and notify HBO physician. If result is 101 mg/dl to 249 mg/dl: A. Discharge patient. A. Notify HBO physician and await physician orders. B. It is recommended to do blood/urine If result is 250 mg/dl or greater: ketone testing. C. If the result meets the hospital definition of a critical result, follow hospital policy. *Juice or candies are NOT equivalent products. If patient refuses the Glucerna or Ensure, please consult the hospital dietitian for an appropriate substitute. Electronic Signature(s) Signed: 07/27/2022 11:33:51 AM By: Carlene Coria RN Signed: 07/27/2022 5:20:28 PM By: Worthy Keeler PA-C Entered By: Carlene Coria on 07/27/2022 11:33:51 [name] ([id number]) -------------------------------------------------------------------------------- Problem List Details Patient Name: [name] Date of Service: 07/27/2022 11:00 AM Medical Record Number: [id number] Patient Account Number: [id number] Date of Birth/Sex: 09-May-1966 (56 y.o. M) Treating RN: Carlene Coria Primary Care Provider: PATIENT, NO Other Clinician: Referring Provider: Fredirick Maudlin Treating Provider/Extender: Skipper Cliche in Treatment: 6 Active Problems ICD-10 Encounter Code Description Active Date MDM Diagnosis [phone] Other chronic osteomyelitis, left ankle and foot 06/15/2022 No Yes E11.621 Type 2 diabetes mellitus with foot ulcer 06/15/2022 No Yes L97.524 Non-pressure chronic ulcer of other part of left foot with necrosis of 06/15/2022 No Yes bone Inactive Problems Resolved Problems Electronic Signature(s) Signed: 07/27/2022 11:31:21 AM By: Worthy Keeler PA-C Entered By: Worthy Keeler on 07/27/2022 11:31:21 [name] ([id number]) -------------------------------------------------------------------------------- Progress Note Details Patient Name: [name] Date of Service: 07/27/2022 11:00 AM Medical Record Number: [id number] Patient Account Number:  [id number] Date of Birth/Sex: May 26, 1966 (56 y.o. 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 Glenford Peers care:   ADMISSION6/15/2023This is a 61 year old poorly controlled type II diabetic (last A1c 11.5%). In January of this year, he presented to the hospital with an abscess in his left foot. He also had a foreign body present. It was removed in the ER and he was admitted to the hospital for IV antibiotics. He was subsequently discharged on oral antibiotics. Due to financial concerns, he has not taken his diabetic medications (metformin and 70/30 insulin) and as a result, presented back to the emergency room on June 1 with worsening findings, including frank pus and osteomyelitis on MRI. BKA was recommended, but he declined. He was taken to the operating room by Dr. Sharol Given who performed an extensive debridement. Cultures were taken and he was placed on IV antibiotics. He saw infectious disease while in the hospital who prescribed a 6-week course of IV antibiotics including cefazolin as well as oral metronidazole and levofloxacin. The infectious disease provider also indicated that he would benefit from below-knee amputation, but the patient desired another opinion and therefore he has been referred to wound care center for further evaluation and management.  ABIs obtained while he was in the hospital demonstrate adequate blood flow. Essentially the entire bottom of the patient's left foot has been removed. The heads of the majority of his metatarsal bones are exposed along with their accompanying tendons. Muscle and fat are exposed as well. The wound  surface is fairly dry; the patient reports that he was not given any specific wound care instructions other than to place a dry dressing on it after showering. There is no odor or purulent drainage identified. 05/26/2022: The wound measures a little bit smaller today. There is some good granulation tissue beginning to form on the surface. He still has exposed bone and tendon. There is some slough accumulation. He saw infectious disease earlier this week and was frustrated that they recommended amputation. He is interested in another surgical opinion. He continues to smoke but says that he has cut down. He is using a knee scooter for mobility so that he can stay off of his foot. 06/05/2022: He saw Dr. Sharol Given last week who was supportive of our efforts to try to salvage the foot. I referred him to vascular surgery to see Dr. Carlis Abbott but he has not received an appointment yet. He continues on IV antibiotics. He says that after our conversation last week about him being unable to initiate hyperbarics if he was going to continue to smoke, he has not had a cigarette since. He is now at 4 weeks of conventional management and his insurance was initiated on July 1. Today, the wound continues to show evidence of improvement. There is good granulation tissue forming. The metatarsals are still exposed but actually have some pink periosteum. There is still some slough and fibrinous exudate present. No odor or purulent drainage. We have just been using Dakin's dressing changes for now. Admission to [place] at [facility]: 06-15-2022 upon evaluation today patient presents for initial inspection here in our clinic though he has been seen by Dr. Celine Ahr in [place] up to this point. I did review her notes and records as well they have been using Dakin's moistened gauze dressing which has done a great [name] at helping to clean out the bottom of his foot. Please see the discourse above for additional information in regard to  treatment course up until the patient arrives here in the [place] clinic today. The patient is ready to get started with HBO therapy as soon as possible I do think based on all my review of his records this seems to be appropriate he does have chronic osteomyelitis is also been offered amputation this is obviously a limb salvage operation here. We are trying to prevent him from having to undergo an extensive amputation which would likely be a below-knee amputation which in turn is going to affect his quality of life he wants to try to avoid that if at all possible I think hyperbaric oxygen therapy is his best bet to achieve that goal. Currently he has been on IV Ancef. That actually ends tomorrow. Following that according to notes it appears he supposed to be taking Levaquin and Flagyl for an additional period of time although I am not certain exactly how long that with the. He has been seeing regional physicians infectious disease in [place] and again that so I recommended that he contact today in order to find out what the plan is going forward. His most recent hemoglobin A1c as noted above was 11.5 on 05-07-2022 he tells me trying to work on that he is also quit  smoking as of this point. 06-22-2022 upon evaluation today patient appears to be doing well currently in regard to his wound all things considered. He is showing signs of a lot of new granulation tissue and overall very pleased. He is changing this once a day with the Dakin's moistened gauze dressing. Fortunately I do not see any evidence of infection locally or systemically at this time. We have been trying to get him into the hyperbaric chamber he was having a lot of issues with sinus pressure and we have made referral and called multiple ENT specialist the earliest we have been able to get his August 9 at [facility] ear nose throat. They are going to try to work him in sooner if they have any opportunities to do so. Obviously I think the  sooner he can do this the better. We need to get him in hyperbarics as quickly as possible. 7/26; patient presents for follow-up. He has been using Dakin's wet-to-dry dressings and oil emollient dressing over the exposed bone. He currently denies systemic signs of infection. He states he is scheduled to see infectious disease, Dr Thedore Mins next week. She had ordered an MRI of the left foot and he is scheduled to have this done tomorrow. He currently denies systemic signs of infection Including fever/chills, nausea/vomiting increased warmth or erythema to the wound bed or purulent drainage. He started hyperbaric oxygen treatment however did not tolerate this due to sinus pressure. He is scheduled to see ENT in 2 weeks for evaluation. He is not continuing HBO until he is cleared by ENT. 07-06-2022 patient presents today for follow-up concerning his plantar foot ulcer. The good news is this actually is significantly better compared to previous. Some of the bone which is necrotic is starting to slough off and I am going to perform debridement to clear this away today. [name], [name] ([id number]) Nonetheless I do think that the patient is going to need hyperbarics and we need to try to get it as soon as possible. He still having a lot of sinus pressure I can even hear the congestion today. He is on 3 different antibiotics cefadroxil, metronidazole, and Levaquin currently for the osteomyelitis. For that reason this should actually help with the infection if he does have a chronic sinusitis I am also going to see about getting him on steroids to try to see if this can benefit him as well. He voiced understanding and he is in agreement with giving that shot. This is probably can to spike his blood sugars which I discussed with him he is going to be seeing his primary care provider later today I want him to let her know as well what is going on and why so that she could be aware but I really do think he needs to take  the prednisone. We need to get him in the chamber as soon as possible to try to help with getting this wound to heal and closed so that he does not lose his foot. 07-13-2022 upon evaluation today patient appears to be doing better in regard to his wound he continues to show signs of new granulation am going to perform some debridement today but overall this is doing quite well. 07-20-2022 upon evaluation today patient's wound is actually showing some signs of improvement he does have 1 area on the fifth metatarsal region where there is some necrotic tendon that is noted at this point. Unfortunately this is preventing good granulation and over this point the rest of  the metatarsal region is completely covered over with granulation tissue which is great news there is some other slough and biofilm buildup that I will clearway as well. 07-27-2022 upon evaluation today patient appears to be doing well currently in regard to his plantar foot ulcer. He has been tolerating the dressing changes without complication fortunately. I do believe he is making excellent progress and each time I see him this is better and better is pretty much completely covered as far as any bone exposure is concerned and he still is on the IV antibiotics were still also undergoing hyperbarics at this point that he just got started and is doing quite well with since getting the tubes in his ears. Objective Constitutional Well-nourished and well-hydrated in no acute distress. Vitals Time Taken: 11:21 AM, Height: 70 in, Weight: 230 lbs, BMI: 33, Temperature: 97.7 F, Pulse: 72 bpm, Respiratory Rate: 16 breaths/min, Blood Pressure: 120/70 mmHg. Respiratory normal breathing without difficulty. Psychiatric this patient is able to make decisions and demonstrates good insight into disease process. Alert and Oriented x 3. pleasant and cooperative. General Notes: Upon inspection patient's wound bed actually showed signs of good  granulation and epithelization at this point. Fortunately there does not appear to be any evidence of infection at this time which is great news and overall I am extremely pleased With where we stand today. Integumentary (Hair, Skin) Wound #1 status is Open. Original cause of wound was Gradually Appeared. The date acquired was: 12/04/2021. The wound has been in treatment 6 weeks. The wound is located on the [place]. The wound measures 13cm length x 8cm width x 0.4cm depth; 81.681cm^2 area and 32.673cm^3 volume. There is bone, muscle, tendon, Fat Layer (Subcutaneous Tissue), and fascia exposed. There is no tunneling or undermining noted. There is a medium amount of serosanguineous drainage noted. There is large (67-100%) pink, pale, hyper - granulation within the wound bed. There is a small (1-33%) amount of necrotic tissue within the wound bed including Adherent Slough and Necrosis of Muscle. Assessment Active Problems ICD-10 Other chronic osteomyelitis, left ankle and foot Type 2 diabetes mellitus with foot ulcer Non-pressure chronic ulcer of other part of left foot with necrosis of bone Plan Follow-up Appointments: [name], [name] ([id number]) Return Appointment in 1 week. Bathing/ Shower/ Hygiene: May shower; gently cleanse wound with antibacterial soap, rinse and pat dry prior to dressing wounds Anesthetic (Use 'Patient Medications' Section for Anesthetic Order Entry): Lidocaine applied to wound bed Edema Control - Lymphedema / Segmental Compressive Device / Other: Elevate, Exercise Daily and Avoid Standing for Long Periods of Time. Elevate legs to the level of the heart and pump ankles as often as possible Elevate leg(s) parallel to the floor when sitting. Hyperbaric Oxygen Therapy: Evaluate for HBO Therapy Indication and location: - osteomyelitis left foot 2.5 ATA for 90 Minutes with 2 Five (5) Minute Air Breaks - total 40 treatments Total # of Treatments: - 40 Antihistamine  30 minutes prior to HBO Treatment, difficulty clearing ears. Finger stick Blood Glucose Pre- and Post- HBOT Treatment. Follow Hyperbaric Oxygen Glycemia Protocol WOUND #1: - Foot Wound Laterality: Plantar, Left Cleanser: Dakin 16 (oz) 0.25 1 x Per Day/30 Days Discharge Instructions: Use as directed. Primary Dressing: Gauze (Generic) 1 x Per Day/30 Days Discharge Instructions: moistened with 1/4 Dakins Solution Secondary Dressing: ABD Pad 5x9 (in/in) (Generic) 1 x Per Day/30 Days Discharge Instructions: Cover with ABD pad Secondary Dressing: Kerlix 4.5 x 4.1 (in/yd) (Generic) 1 x Per Day/30 Days Discharge Instructions: Apply  Kerlix 4.5 x 4.1 (in/yd) as instructed Secured With: Medipore Tape - 48M Medipore H Soft Cloth Surgical Tape, 2x2 (in/yd) (Generic) 1 x Per Day/30 Days 1. I would recommend currently based on what we are seeing that we go ahead and initiate a continuation of treatment with the Dakin's moistened gauze dressing which I think is doing a great [name]. 2. We will continue with ABD pad to cover 3 I am going to recommend that we have the patient continue with the roll gauze to secure in place. We will see patient back for reevaluation in 1 week here in the clinic. If anything worsens or changes patient will contact our office for additional recommendations. Electronic Signature(s) Signed: 07/27/2022 2:08:51 PM By: Worthy Keeler PA-C Entered By: Worthy Keeler on 07/27/2022 14:08:51 [name] ([id number]) -------------------------------------------------------------------------------- SuperBill Details Patient Name: [name] Date of Service: 07/27/2022 Medical Record Number: [id number] Patient Account Number: [id number] Date of Birth/Sex: [birth year] (56 y.o. M) Treating RN: Carlene Coria Primary Care Provider: PATIENT, NO Other Clinician: Referring Provider: Fredirick Maudlin Treating Provider/Extender: Skipper Cliche in Treatment: 6 Diagnosis Coding ICD-10 Codes Code  Description [phone] Other chronic osteomyelitis, left ankle and foot E11.621 Type 2 diabetes mellitus with foot ulcer L97.524 Non-pressure chronic ulcer of other part of left foot with necrosis of bone Facility Procedures CPT4 Code: [id number] Description: 99213 - WOUND CARE VISIT-LEV 3 EST PT Modifier: Quantity: 1 Physician Procedures CPT4 Code: [id number] Description: [id number] - WC PHYS LEVEL 3 - EST PT Modifier: Quantity: 1 CPT4 Code: Description: ICD-10 Diagnosis Description M86.672 Other chronic osteomyelitis, left ankle and foot E11.621 Type 2 diabetes mellitus with foot ulcer L97.524 Non-pressure chronic ulcer of other part of left foot with necrosis of Modifier: bone Quantity: Electronic Signature(s) Signed: 07/27/2022 2:09:58 PM By: Worthy Keeler PA-C Previous Signature: 07/27/2022 11:34:34 AM Version By: Carlene Coria RN Entered By: Worthy Keeler on 07/27/2022 14:09:57

## 2022-07-27 NOTE — Progress Notes (Signed)
[name] ([id number]) Visit Report for 07/27/2022 Arrival Information Details Patient Name: [name], [name] Date of Service: 07/27/2022 11:00 AM Medical Record Number: [id number] Patient Account Number: [id number] Date of Birth/Sex: 1966-04-16 (56 y.o. M) Treating RN: [name] Primary Care [name]: PATIENT, NO Other Clinician: Referring [name]: [name] Treating [name]/Extender: [name] in Treatment: 6 Visit Information History Since Last Visit All ordered tests and consults were completed: No Patient Arrived: Knee Scooter Added or deleted any medications: No Arrival Time: 11:20 Any new allergies or adverse reactions: No Accompanied By: self Had a fall or experienced change in No Transfer Assistance: None activities of daily living that may affect Patient Identification Verified: Yes risk of falls: Secondary Verification Process Completed: Yes Signs or symptoms of abuse/neglect since last visito No Patient Requires Transmission-Based Precautions: No Hospitalized since last visit: No Patient Has Alerts: No Implantable device outside of the clinic excluding No cellular tissue based products placed in the center since last visit: Has Dressing in Place as Prescribed: Yes Pain Present Now: No Electronic Signature(s) Signed: 07/27/2022 4:19:23 PM By: Matthew Coria RN Entered By: [name] on 07/27/2022 11:21:56 [name] ([id number]) -------------------------------------------------------------------------------- Clinic Level of Care Assessment Details Patient Name: [name] Date of Service: 07/27/2022 11:00 AM Medical Record Number: [id number] Patient Account Number: [id number] Date of Birth/Sex: 11-05-1966 (56 y.o. M) Treating RN: [name] Primary Care [name]: PATIENT, NO Other Clinician: Referring [name]: [name] Treating [name]/Extender: [name] in Treatment: 6 Clinic Level of Care Assessment Items TOOL 4 Quantity  Score X - Use when only an EandM is performed on FOLLOW-UP visit 1 0 ASSESSMENTS - Nursing Assessment / Reassessment X - Reassessment of Co-morbidities (includes updates in patient status) 1 10 X- 1 5 Reassessment of Adherence to Treatment Plan ASSESSMENTS - Wound and Skin Assessment / Reassessment X - Simple Wound Assessment / Reassessment - one wound 1 5 []  - 0 Complex Wound Assessment / Reassessment - multiple wounds []  - 0 Dermatologic / Skin Assessment (not related to wound area) ASSESSMENTS - Focused Assessment []  - Circumferential Edema Measurements - multi extremities 0 []  - 0 Nutritional Assessment / Counseling / Intervention []  - 0 Lower Extremity Assessment (monofilament, tuning fork, pulses) []  - 0 Peripheral Arterial Disease Assessment (using hand held doppler) ASSESSMENTS - Ostomy and/or Continence Assessment and Care []  - Incontinence Assessment and Management 0 []  - 0 Ostomy Care Assessment and Management (repouching, etc.) PROCESS - Coordination of Care X - Simple Patient / Family Education for ongoing care 1 15 []  - 0 Complex (extensive) Patient / Family Education for ongoing care X- 1 10 Staff obtains Programmer, systems, Records, Test Results / Process Orders []  - 0 Staff telephones HHA, Nursing Homes / Clarify orders / etc []  - 0 Routine Transfer to another Facility (non-emergent condition) []  - 0 Routine Hospital Admission (non-emergent condition) []  - 0 New Admissions / Biomedical engineer / Ordering NPWT, Apligraf, etc. []  - 0 Emergency Hospital Admission (emergent condition) X- 1 10 Simple Discharge Coordination []  - 0 Complex (extensive) Discharge Coordination PROCESS - Special Needs []  - Pediatric / Minor Patient Management 0 []  - 0 Isolation Patient Management []  - 0 Hearing / Language / Visual special needs []  - 0 Assessment of Community assistance (transportation, D/C planning, etc.) []  - 0 Additional assistance / Altered mentation []  -  0 Support Surface(s) Assessment (bed, cushion, seat, etc.) INTERVENTIONS - Wound Cleansing / Measurement [name] ([id number]) X- 1 5 Simple Wound Cleansing - one wound []  -  0 Complex Wound Cleansing - multiple wounds X- 1 5 Wound Imaging (photographs - any number of wounds) []  - 0 Wound Tracing (instead of photographs) X- 1 5 Simple Wound Measurement - one wound []  - 0 Complex Wound Measurement - multiple wounds INTERVENTIONS - Wound Dressings X - Small Wound Dressing one or multiple wounds 1 10 []  - 0 Medium Wound Dressing one or multiple wounds []  - 0 Large Wound Dressing one or multiple wounds []  - 0 Application of Medications - topical []  - 0 Application of Medications - injection INTERVENTIONS - Miscellaneous []  - External ear exam 0 []  - 0 Specimen Collection (cultures, biopsies, blood, body fluids, etc.) []  - 0 Specimen(s) / Culture(s) sent or taken to Lab for analysis []  - 0 Patient Transfer (multiple staff / / Similar devices) []  - 0 Simple Staple / Suture removal (25 or less) []  - 0 Complex Staple / Suture removal (26 or more) []  - 0 Hypo / Hyperglycemic Management (close monitor of Blood Glucose) []  - 0 Ankle / Brachial Index (ABI) - do not check if billed separately X- 1 5 Vital Signs Has the patient been seen at the hospital within the last three years: Yes Total Score: 85 Level Of Care: New/Established - Level 3 Electronic Signature(s) Signed: 07/27/2022 4:19:23 PM By: RN Entered By: on 07/27/2022 11:34:29 ( ) -------------------------------------------------------------------------------- Encounter Discharge Information Details Patient Name: Date of Service: 07/27/2022 11:00 AM Medical Record Number: Patient Account Number: Nurse, adult Date of Birth/Sex: 08-31-1966 (56 y.o. M) Treating RN: Primary Care [name]: PATIENT, NO Other Clinician: Referring  [name]: Treating [name]/Extender: 07/29/2022 in Treatment: 6 Encounter Discharge Information Items Discharge Condition: Stable Ambulatory Status: Knee Scooter Discharge Destination: Home Transportation: Private Auto Accompanied By: self Schedule Follow-up Appointment: Yes Clinical Summary of Care: Patient Declined Electronic Signature(s) Signed: 07/27/2022 11:35:22 AM By: Yevonne Pax RN Entered By: 07/29/2022 on 07/27/2022 11:35:22 [id number] ([name]) -------------------------------------------------------------------------------- Lower Extremity Assessment Details Patient Name: 07/29/2022 Date of Service: 07/27/2022 11:00 AM Medical Record Number: [id number] Patient Account Number: 12/18/1965 Date of Birth/Sex: 1965-12-11 (56 y.o. M) Treating RN: Duanne Guess Primary Care Michail Boyte: PATIENT, NO Other Clinician: Referring Yalanda Soderman: Rowan Blase Treating Maleeka Sabatino/Extender: 07/29/2022 in Treatment: 6 Edema Assessment Assessed: [Left: No] [Right: No] Edema: [Left: Ye] [Right: s] Calf Left: Right: Point of Measurement: 32 cm From Medial Instep 39 cm Ankle Left: Right: Point of Measurement: 11 cm From Medial Instep 24 cm Vascular Assessment Pulses: Dorsalis Pedis Palpable: [Left:Yes] Electronic Signature(s) Signed: 07/27/2022 4:19:23 PM By: Yevonne Pax RN Entered By: 07/29/2022 on 07/27/2022 11:23:14 [name] ([name]) -------------------------------------------------------------------------------- Multi Wound Chart Details Patient Name: 07/29/2022 Date of Service: 07/27/2022 11:00 AM Medical Record Number: [id number] Patient Account Number: 12/18/1965 Date of Birth/Sex: 08-Mar-1966 (56 y.o. M) Treating RN: Duanne Guess Primary Care Makaley Storts: PATIENT, NO Other Clinician: Referring Kymiah Araiza: Rowan Blase Treating Arville Postlewaite/Extender: 07/29/2022 in Treatment: 6 Photos: [N/A:N/A] Wound Location: Left, Plantar Foot  N/A N/A Wounding Event: Gradually Appeared N/A N/A Primary Etiology: Diabetic Wound/Ulcer of the Lower N/A N/A Extremity Comorbid History: Type II Diabetes N/A N/A Date Acquired: 12/04/2021 N/A N/A Weeks of Treatment: 6 N/A N/A Wound Status: Open N/A N/A Wound Recurrence: No N/A N/A Pending Amputation on Yes N/A N/A Presentation: Measurements L x W x D (cm) 13x8x0.4 N/A N/A Area (cm) : 81.681 N/A N/A Volume (cm) : 32.673 N/A N/A % Reduction in  Area: 18.80% N/A N/A % Reduction in Volume: 35.00% N/A N/A Classification: Grade 4 N/A N/A Exudate Amount: Medium N/A N/A Exudate Type: Serosanguineous N/A N/A Exudate Color: red, brown N/A N/A Granulation Amount: Large (67-100%) N/A N/A Granulation Quality: Pink, Pale, Hyper-granulation N/A N/A Necrotic Amount: Small (1-33%) N/A N/A Exposed Structures: Fascia: Yes N/A N/A Fat Layer (Subcutaneous Tissue): Yes Tendon: Yes Muscle: Yes Bone: Yes Joint: No Epithelialization: None N/A N/A Treatment Notes Electronic Signature(s) Signed: 07/27/2022 4:19:23 PM By: Matthew Coria RN Entered By: [name] on 07/27/2022 11:23:31 [name] ([id number]) -------------------------------------------------------------------------------- Multi-Disciplinary Care Plan Details Patient Name: [name] Date of Service: 07/27/2022 11:00 AM Medical Record Number: [id number] Patient Account Number: [id number] Date of Birth/Sex: 01/08/1966 (56 y.o. M) Treating RN: [name] Primary Care Kareema Keitt: PATIENT, NO Other Clinician: Referring Kaedyn Polivka: [name] Treating Laylonie Marzec/Extender: [name] in Treatment: 6 Active Inactive Necrotic Tissue Nursing Diagnoses: Impaired tissue integrity related to necrotic/devitalized tissue Knowledge deficit related to management of necrotic/devitalized tissue Goals: Necrotic/devitalized tissue will be minimized in the wound bed Date Initiated: 06/28/2022 Target Resolution Date: 06/28/2022 Goal  Status: Active Patient/caregiver will verbalize understanding of reason and process for debridement of necrotic tissue Date Initiated: 06/28/2022 Target Resolution Date: 06/28/2022 Goal Status: Active Interventions: Assess patient pain level pre-, during and post procedure and prior to discharge Provide education on necrotic tissue and debridement process Treatment Activities: Excisional debridement : 06/28/2022 Notes: Orientation to the Wound Care Program Nursing Diagnoses: Knowledge deficit related to the wound healing center program Goals: Patient/caregiver will verbalize understanding of the [facility] Date Initiated: 06/28/2022 Target Resolution Date: 06/28/2022 Goal Status: Active Interventions: Provide education on orientation to the wound center Notes: Osteomyelitis Nursing Diagnoses: Infection: osteomyelitis Knowledge deficit related to disease process and management Potential for infection: osteomyelitis Goals: Diagnostic evaluation for osteomyelitis completed as ordered Date Initiated: 06/28/2022 Target Resolution Date: 06/28/2022 Goal Status: Active Patient/caregiver will verbalize understanding of disease process and disease management Date Initiated: 06/28/2022 Target Resolution Date: 06/28/2022 Goal Status: Active Patient's osteomyelitis will resolve Date Initiated: 06/28/2022 Target Resolution Date: 06/28/2022 [name], [name] ([id number]) Goal Status: Active Signs and symptoms for osteomyelitis will be recognized and promptly addressed Date Initiated: 06/28/2022 Target Resolution Date: 06/28/2022 Goal Status: Active Interventions: Assess for signs and symptoms of osteomyelitis resolution every visit Provide education on osteomyelitis Screen for HBO Notes: Wound/Skin Impairment Nursing Diagnoses: Knowledge deficit related to ulceration/compromised skin integrity Goals: Patient/caregiver will verbalize understanding of skin care regimen Date  Initiated: 06/15/2022 Target Resolution Date: 07/16/2022 Goal Status: Active Ulcer/skin breakdown will have a volume reduction of 30% by week 4 Date Initiated: 06/15/2022 Target Resolution Date: 07/16/2022 Goal Status: Active Ulcer/skin breakdown will have a volume reduction of 50% by week 8 Date Initiated: 06/15/2022 Target Resolution Date: 08/16/2022 Goal Status: Active Ulcer/skin breakdown will have a volume reduction of 80% by week 12 Date Initiated: 06/15/2022 Target Resolution Date: 09/15/2022 Goal Status: Active Ulcer/skin breakdown will heal within 14 weeks Date Initiated: 06/15/2022 Target Resolution Date: 10/16/2022 Goal Status: Active Interventions: Assess patient/caregiver ability to obtain necessary supplies Assess patient/caregiver ability to perform ulcer/skin care regimen upon admission and as needed Assess ulceration(s) every visit Notes: Electronic Signature(s) Signed: 07/27/2022 4:19:23 PM By: Matthew Coria RN Entered By: [name] on 07/27/2022 11:23:23 [name] ([id number]) -------------------------------------------------------------------------------- Pain Assessment Details Patient Name: [name] Date of Service: 07/27/2022 11:00 AM Medical Record Number: [id number] Patient Account Number: [id number] Date of Birth/Sex: [birth year] (56 y.o. M) Treating RN: [name] Primary Care Shantel Wesely: PATIENT,  NO Other Clinician: Referring Valente Fosberg: Duanne Guess Treating Laron Angelini/Extender: Rowan Blase in Treatment: 6 Active Problems Location of Pain Severity and Description of Pain Patient Has Paino No Site Locations Pain Management and Medication Current Pain Management: Electronic Signature(s) Signed: 07/27/2022 11:33:18 AM By: Yevonne Pax RN Entered By: Yevonne Pax on 07/27/2022 11:33:17 [name] ([id number]) -------------------------------------------------------------------------------- Patient/Caregiver Education Details Patient Name:  [name] Date of Service: 07/27/2022 11:00 AM Medical Record Number: [id number] Patient Account Number: [id number] Date of Birth/Gender: October 24, 1966 (56 y.o. M) Treating RN: Yevonne Pax Primary Care Physician: PATIENT, NO Other Clinician: Referring Physician: Duanne Guess Treating Physician/Extender: Rowan Blase in Treatment: 6 Education Assessment Education Provided To: Patient Education Topics Provided Infection: Methods: Explain/Verbal Responses: State content correctly Electronic Signature(s) Signed: 07/27/2022 4:19:23 PM By: Yevonne Pax RN Entered By: Yevonne Pax on 07/27/2022 11:34:46 [name] ([id number]) -------------------------------------------------------------------------------- Wound Assessment Details Patient Name: [name] Date of Service: 07/27/2022 11:00 AM Medical Record Number: [id number] Patient Account Number: [id number] Date of Birth/Sex: 12/31/1965 (56 y.o. M) Treating RN: Yevonne Pax Primary Care Bradin Mcadory: PATIENT, NO Other Clinician: Referring Bevin Das: Duanne Guess Treating Nekita Pita/Extender: Rowan Blase in Treatment: 6 Wound Status Wound Number: 1 Primary Etiology: Diabetic Wound/Ulcer of the Lower Extremity Wound Location: Left, Plantar Foot Wound Status: Open Wounding Event: Gradually Appeared Comorbid History: Type II Diabetes Date Acquired: 12/04/2021 Weeks Of Treatment: 6 Clustered Wound: No Pending Amputation On Presentation Photos Wound Measurements Length: (cm) 13 Width: (cm) 8 Depth: (cm) 0.4 Area: (cm) 81.681 Volume: (cm) 32.673 % Reduction in Area: 18.8% % Reduction in Volume: 35% Epithelialization: None Tunneling: No Undermining: No Wound Description Classification: Grade 4 F Exudate Amount: Medium S Exudate Type: Serosanguineous Exudate Color: red, brown oul Odor After Cleansing: No lough/Fibrino Yes Wound Bed Granulation Amount: Large (67-100%) Exposed Structure Granulation Quality: Pink,  Pale, Hyper-granulation Fascia Exposed: Yes Necrotic Amount: Small (1-33%) Fat Layer (Subcutaneous Tissue) Exposed: Yes Necrotic Quality: Adherent Slough Tendon Exposed: Yes Muscle Exposed: Yes Necrosis of Muscle: Yes Joint Exposed: No Bone Exposed: Yes Treatment Notes Wound #1 (Foot) Wound Laterality: Plantar, Left Cleanser Dakin 16 (oz) 0.25 Discharge Instruction: Use as directed. NEPHTALI, DOCKEN ([id number]) Peri-Wound Care Topical Primary Dressing Gauze Discharge Instruction: moistened with 1/4 Dakins Solution Secondary Dressing ABD Pad 5x9 (in/in) Discharge Instruction: Cover with ABD pad Kerlix 4.5 x 4.1 (in/yd) Discharge Instruction: Apply Kerlix 4.5 x 4.1 (in/yd) as instructed Secured With Medipore Tape - 59M Medipore H Soft Cloth Surgical Tape, 2x2 (in/yd) Compression Wrap Compression Stockings Add-Ons Electronic Signature(s) Signed: 07/27/2022 4:19:23 PM By: Yevonne Pax RN Entered By: Yevonne Pax on 07/27/2022 11:22:34 [name] ([id number]) -------------------------------------------------------------------------------- Vitals Details Patient Name: [name] Date of Service: 07/27/2022 11:00 AM Medical Record Number: [id number] Patient Account Number: [id number] Date of Birth/Sex: 1966-01-21 (56 y.o. M) Treating RN: Yevonne Pax Primary Care Reneka Nebergall: PATIENT, NO Other Clinician: Referring Luka Stohr: Duanne Guess Treating Latressa Harries/Extender: Rowan Blase in Treatment: 6 Vital Signs Time Taken: 11:21 Temperature (F): 97.7 Height (in): 70 Pulse (bpm): 72 Weight (lbs): 230 Respiratory Rate (breaths/min): 16 Body Mass Index (BMI): 33 Blood Pressure (mmHg): 120/70 Reference Range: 80 - 120 mg / dl Electronic Signature(s) Signed: 07/27/2022 11:33:08 AM By: Yevonne Pax RN Entered By: Yevonne Pax on 07/27/2022 11:33:08

## 2022-07-27 NOTE — Progress Notes (Signed)
[name], [name] ([id number]) Visit Report for 07/27/2022 Arrival Information Details Patient Name: [name], [name] Date of Service: 07/27/2022 8:00 AM Medical Record Number: [id number] Patient Account Number: [id number] Date of Birth/Sex: 03/31/1966 (56 y.o. M) Treating RN: Yevonne Pax Primary Care Taletha Twiford: PATIENT, NO Other Clinician: Izetta Dakin Referring Mikeal Winstanley: Duanne Guess Treating Norvell Ureste/Extender: Rowan Blase in Treatment: 6 Visit Information History Since Last Visit Added or deleted any medications: No Patient Arrived: Knee Scooter Any new allergies or adverse reactions: No Arrival Time: 08:12 Had a fall or experienced change in No Accompanied By: self activities of daily living that may affect Transfer Assistance: None risk of falls: Patient Identification Verified: Yes Signs or symptoms of abuse/neglect since last visito No Secondary Verification Process Completed: Yes Hospitalized since last visit: No Patient Requires Transmission-Based Precautions: No Implantable device outside of the clinic excluding No Patient Has Alerts: No cellular tissue based products placed in the center since last visit: Has Dressing in Place as Prescribed: Yes Pain Present Now: No Electronic Signature(s) Signed: 07/27/2022 1:59:26 PM By: Aleda Grana RCP, RRT, CHT Entered By: Aleda Grana on 07/27/2022 08:45:31 [name] ([id number]) -------------------------------------------------------------------------------- Encounter Discharge Information Details Patient Name: [name] Date of Service: 07/27/2022 8:00 AM Medical Record Number: [id number] Patient Account Number: [id number] Date of Birth/Sex: 10/29/1966 (55 y.o. M) Treating RN: Yevonne Pax Primary Care Jailyn Leeson: PATIENT, NO Other Clinician: Izetta Dakin Referring Celestial Barnfield: Duanne Guess Treating Graclyn Lawther/Extender: Rowan Blase in Treatment: 6 Encounter Discharge Information  Items Discharge Condition: Stable Ambulatory Status: Knee Scooter Discharge Destination: Home Transportation: Private Auto Accompanied By: self Schedule Follow-up Appointment: Yes Clinical Summary of Care: Notes Patient has an HBO treatment scheduled on 07/28/22 at 7:45 am. Electronic Signature(s) Signed: 07/27/2022 1:59:26 PM By: Aleda Grana RCP, RRT, CHT Entered By: Aleda Grana on 07/27/2022 13:52:52 [name] ([id number]) -------------------------------------------------------------------------------- Vitals Details Patient Name: [name] Date of Service: 07/27/2022 8:00 AM Medical Record Number: [id number] Patient Account Number: [id number] Date of Birth/Sex: Apr 25, 1966 (56 y.o. M) Treating RN: Yevonne Pax Primary Care Constance Whittle: PATIENT, NO Other Clinician: Izetta Dakin Referring Mina Carlisi: Duanne Guess Treating Garyson Stelly/Extender: Rowan Blase in Treatment: 6 Vital Signs Time Taken: 08:17 Temperature (F): 97.7 Height (in): 70 Pulse (bpm): 66 Weight (lbs): 230 Respiratory Rate (breaths/min): 16 Body Mass Index (BMI): 33 Blood Pressure (mmHg): 122/64 Capillary Blood Glucose (mg/dl): 102 Reference Range: 80 - 120 mg / dl Electronic Signature(s) Signed: 07/27/2022 1:59:26 PM By: Aleda Grana RCP, RRT, CHT Entered By: Aleda Grana on 07/27/2022 08:46:09

## 2022-07-27 NOTE — Progress Notes (Signed)
Matthew, [name] ([id number]) Visit Report for 07/27/2022 HBO Details Patient Name: [name], [name] Date of Service: 07/27/2022 8:00 AM Medical Record Number: [id number] Patient Account Number: [id number] Date of Birth/Sex: Dec 25, 1965 (56 y.o. M) Treating RN: Yevonne Pax Primary Care Shailey Butterbaugh: PATIENT, NO Other Clinician: Izetta Dakin Referring Saron Tweed: Duanne Guess Treating Davin Archuletta/Extender: Rowan Blase in Treatment: 6 HBO Treatment Course Details Treatment Course Number: 1 Ordering Jamira Barfuss: Allen Derry Total Treatments Ordered: 40 HBO Treatment Start Date: 06/19/2022 HBO Indication: Diabetic Ulcer(s) of the Lower Extremity Standard/Conservative Wound Care tried and failed greater than or equal to 30 days Wound #1 Plantar Foot HBO Treatment Details Treatment Number: 5 Patient Type: Outpatient Chamber Type: Monoplace Chamber Serial #: [id number] Treatment Protocol: 2.5 ATA with 90 minutes oxygen, with two 5 minute air breaks Treatment Details Compression Rate Down: 1.5 psi / minute De-Compression Rate Up: 1.0 psi / minute Compress Tx Pressure Air breaks and breathing periods Decompress Decompress Begins Reached (leave unused spaces blank) Begins Ends Chamber Pressure (ATA) 1 2.5 2.5 2.5 2.5 2.5 - - 2.5 1 Clock Time (24 hr) 08:26 08:36 - - - - - - 10:07 10:16 Treatment Length: 110 (minutes) Treatment Segments: 4 Vital Signs Capillary Blood Glucose Reference Range: 80 - 120 mg / dl HBO Diabetic Blood Glucose Intervention Range: <131 mg/dl or >846 mg/dl Time Vitals Blood Respiratory Capillary Blood Glucose Pulse Action Type: Pulse: Temperature: Taken: Pressure: Rate: Glucose (mg/dl): Meter #: Oximetry (%) Taken: Pre 08:17 122/64 66 16 97.7 216 1 none per protocol Post 10:20 122/64 66 16 97.7 256 1 none per protocol Treatment Response Treatment Toleration: Well Treatment Completion Treatment Completed without Adverse Event Status: Electronic Signature(s) Signed:  07/27/2022 1:59:26 PM By: Aleda Grana RCP, RRT, CHT Signed: 07/27/2022 5:20:28 PM By: Lenda Kelp PA-C Entered By: Aleda Grana on 07/27/2022 13:51:52 [name] ([id number]) -------------------------------------------------------------------------------- HBO Safety Checklist Details Patient Name: [name] Date of Service: 07/27/2022 8:00 AM Medical Record Number: [id number] Patient Account Number: [id number] Date of Birth/Sex: 06-Aug-1966 (56 y.o. M) Treating RN: Yevonne Pax Primary Care Bryley Kovacevic: PATIENT, NO Other Clinician: Izetta Dakin Referring Madsen Riddle: Duanne Guess Treating Charrise Lardner/Extender: Rowan Blase in Treatment: 6 HBO Safety Checklist Items Safety Checklist Consent Form Signed Patient voided / foley secured and emptied When did you last eato 07:00 am Last dose of injectable or oral agent 07/26/22 pm Ostomy pouch emptied and vented if applicable NA All implantable devices assessed, documented and approved NA Intravenous access site secured and place NA Valuables secured Linens and cotton and cotton/polyester blend (less than 51% polyester) Personal oil-based products / skin lotions / body lotions removed Wigs or hairpieces removed NA Smoking or tobacco materials removed NA Books / newspapers / magazines / loose paper removed NA Cologne, aftershave, perfume and deodorant removed Jewelry removed (may wrap wedding band) Make-up removed NA Hair care products removed Battery operated devices (external) removed NA Heating patches and chemical warmers removed NA Titanium eyewear removed NA Nail polish cured greater than 10 hours NA Casting material cured greater than 10 hours NA Hearing aids removed NA Loose dentures or partials removed NA Prosthetics have been removed NA Patient demonstrates correct use of air break device (if applicable) Patient concerns have been addressed Patient grounding bracelet on and cord  attached to chamber Specifics for Inpatients (complete in addition to above) Medication sheet sent with patient Intravenous medications needed or due during therapy sent with patient Drainage tubes (e.g. nasogastric tube or chest tube secured and vented) Endotracheal or Tracheotomy  tube secured Cuff deflated of air and inflated with saline Airway suctioned Electronic Signature(s) Signed: 07/27/2022 1:59:26 PM By: Aleda Grana RCP, RRT, CHT Entered By: Aleda Grana on 07/27/2022 08:47:20

## 2022-07-28 ENCOUNTER — Encounter: Payer: 59 | Admitting: Physician Assistant

## 2022-07-31 ENCOUNTER — Encounter: Payer: 59 | Admitting: Orthopedic Surgery

## 2022-07-31 ENCOUNTER — Encounter: Payer: 59 | Admitting: Physician Assistant

## 2022-07-31 ENCOUNTER — Ambulatory Visit: Payer: 59 | Admitting: Internal Medicine

## 2022-07-31 DIAGNOSIS — E11621 Type 2 diabetes mellitus with foot ulcer: Secondary | ICD-10-CM | POA: Diagnosis not present

## 2022-07-31 DIAGNOSIS — L97524 Non-pressure chronic ulcer of other part of left foot with necrosis of bone: Secondary | ICD-10-CM | POA: Diagnosis not present

## 2022-07-31 LAB — GLUCOSE, CAPILLARY
Glucose-Capillary: 252 mg/dL — ABNORMAL HIGH (ref 70–99)
Glucose-Capillary: 290 mg/dL — ABNORMAL HIGH (ref 70–99)

## 2022-07-31 NOTE — Progress Notes (Signed)
[name], [name] ([id number]) Visit Report for 07/31/2022 HBO Details Patient Name: [name], [name] Date of Service: 07/31/2022 8:00 AM Medical Record Number: [id number] Patient Account Number: [id number] Date of Birth/Sex: 07-13-1966 (56 y.o. M) Treating RN: Yevonne Pax Primary Care [name]: PATIENT, NO Other Clinician: Izetta [name] Referring [name]: [name] Treating [name]/Extender: [name] in Treatment: 6 HBO Treatment Course Details Treatment Course Number: 1 Ordering [name]: [name] Total Treatments Ordered: 40 HBO Treatment Start Date: 06/19/2022 HBO Indication: Diabetic Ulcer(s) of the Lower Extremity Standard/Conservative Wound Care tried and failed greater than or equal to 30 days Wound #1 Plantar Foot HBO Treatment Details Treatment Number: 6 Patient Type: Outpatient Chamber Type: Monoplace Chamber Serial #: [id number] Treatment Protocol: 2.5 ATA with 90 minutes oxygen, with two 5 minute air breaks Treatment Details Compression Rate Down: 1.5 psi / minute De-Compression Rate Up: 1.5 psi / minute Compress Tx Pressure Air breaks and breathing periods Decompress Decompress Begins Reached (leave unused spaces blank) Begins Ends Chamber Pressure (ATA) 1 2.5 2.5 2.5 2.5 2.5 - - 2.5 1 Clock Time (24 hr) 08:35 08:45 - - - - - - 10:15 10:25 Treatment Length: 110 (minutes) Treatment Segments: 4 Vital Signs Capillary Blood Glucose Reference Range: 80 - 120 mg / dl HBO Diabetic Blood Glucose Intervention Range: <131 mg/dl or >353 mg/dl Time Vitals Blood Respiratory Capillary Blood Glucose Pulse Action Type: Pulse: Temperature: Taken: Pressure: Rate: Glucose (mg/dl): Meter #: Oximetry (%) Taken: Pre 08:26 120/76 72 16 98.6 252 252 none per protocol Post 10:35 122/64 72 16 97.5 290 1 none per protocol Treatment Response Treatment Toleration: Well Treatment Completion Treatment Completed without Adverse Event Status: Electronic Signature(s) Signed:  07/31/2022 1:24:19 PM By: Aleda Grana RCP, RRT, CHT Signed: 07/31/2022 5:17:38 PM By: Lenda [name] Entered By: Aleda Grana on 07/31/2022 11:27:18 [name] ([id number]) -------------------------------------------------------------------------------- HBO Safety Checklist Details Patient Name: [name] Date of Service: 07/31/2022 8:00 AM Medical Record Number: [id number] Patient Account Number: [id number] Date of Birth/Sex: 03/17/1966 (56 y.o. M) Treating RN: Yevonne Pax Primary Care [name]: PATIENT, NO Other Clinician: Izetta [name] Referring [name]: [name] Treating [name]/Extender: [name] in Treatment: 6 HBO Safety Checklist Items Safety Checklist Consent Form Signed Patient voided / foley secured and emptied When did you last eato 07/30/22 pm Last dose of injectable or oral agent 07/30/22 Ostomy pouch emptied and vented if applicable NA All implantable devices assessed, documented and approved NA Intravenous access site secured and place NA Valuables secured Linens and cotton and cotton/polyester blend (less than 51% polyester) Personal oil-based products / skin lotions / body lotions removed Wigs or hairpieces removed NA Smoking or tobacco materials removed NA Books / newspapers / magazines / loose paper removed NA Cologne, aftershave, perfume and deodorant removed Jewelry removed (may wrap wedding band) Make-up removed NA Hair care products removed Battery operated devices (external) removed NA Heating patches and chemical warmers removed NA Titanium eyewear removed NA Nail polish cured greater than 10 hours NA Casting material cured greater than 10 hours NA Hearing aids removed NA Loose dentures or partials removed NA Prosthetics have been removed NA Patient demonstrates correct use of air break device (if applicable) Patient concerns have been addressed Patient grounding bracelet on and cord  attached to chamber Specifics for Inpatients (complete in addition to above) Medication sheet sent with patient Intravenous medications needed or due during therapy sent with patient Drainage tubes (e.g. nasogastric tube or chest tube secured and vented) Endotracheal or Tracheotomy tube  secured Cuff deflated of air and inflated with saline Airway suctioned Electronic Signature(s) Signed: 07/31/2022 1:24:19 PM By: Aleda Grana RCP, RRT, CHT Entered By: Aleda Grana on 07/31/2022 08:46:35

## 2022-07-31 NOTE — Progress Notes (Signed)
JHALIL, SILVERA ([id number]) Visit Report for 07/31/2022 Arrival Information Details Patient Name: [name], [name] Date of Service: 07/31/2022 8:00 AM Medical Record Number: [id number] Patient Account Number: [id number] Date of Birth/Sex: 07-14-1966 (56 y.o. M) Treating RN: Yevonne Pax Primary Care Kyia Rhude: PATIENT, NO Other Clinician: Izetta Dakin Referring Chayna Surratt: Duanne Guess Treating Hanad Leino/Extender: Rowan Blase in Treatment: 6 Visit Information History Since Last Visit Added or deleted any medications: No Patient Arrived: Knee Scooter Any new allergies or adverse reactions: No Arrival Time: 08:20 Had a fall or experienced change in No Accompanied By: self activities of daily living that may affect Transfer Assistance: None risk of falls: Patient Identification Verified: Yes Signs or symptoms of abuse/neglect since last visito No Secondary Verification Process Completed: Yes Hospitalized since last visit: No Patient Requires Transmission-Based Precautions: No Implantable device outside of the clinic excluding No Patient Has Alerts: No cellular tissue based products placed in the center since last visit: Has Dressing in Place as Prescribed: Yes Pain Present Now: No Electronic Signature(s) Signed: 07/31/2022 1:24:19 PM By: Aleda Grana RCP, RRT, CHT Entered By: Aleda Grana on 07/31/2022 08:42:15 [name] ([id number]) -------------------------------------------------------------------------------- Encounter Discharge Information Details Patient Name: [name] Date of Service: 07/31/2022 8:00 AM Medical Record Number: [id number] Patient Account Number: [id number] Date of Birth/Sex: 1966/03/17 (56 y.o. M) Treating RN: Yevonne Pax Primary Care Vani Gunner: PATIENT, NO Other Clinician: Izetta Dakin Referring Damariz Paganelli: Duanne Guess Treating Hymen Arnett/Extender: Rowan Blase in Treatment: 6 Encounter Discharge Information  Items Discharge Condition: Stable Ambulatory Status: Knee Scooter Discharge Destination: Home Transportation: Private Auto Accompanied By: self Schedule Follow-up Appointment: Yes Clinical Summary of Care: Notes Patient has an HBO treatment scheduled on 08/02/22 at 10:00 am. Electronic Signature(s) Signed: 07/31/2022 1:24:19 PM By: Aleda Grana RCP, RRT, CHT Entered By: Aleda Grana on 07/31/2022 11:38:12 [name] ([id number]) -------------------------------------------------------------------------------- Vitals Details Patient Name: [name] Date of Service: 07/31/2022 8:00 AM Medical Record Number: [id number] Patient Account Number: [id number] Date of Birth/Sex: 08-Mar-1966 (56 y.o. M) Treating RN: Yevonne Pax Primary Care Dvontae Ruan: PATIENT, NO Other Clinician: Izetta Dakin Referring Satori Krabill: Duanne Guess Treating Alnita Aybar/Extender: Rowan Blase in Treatment: 6 Vital Signs Time Taken: 08:26 Temperature (F): 98.6 Height (in): 70 Pulse (bpm): 72 Weight (lbs): 230 Respiratory Rate (breaths/min): 16 Body Mass Index (BMI): 33 Blood Pressure (mmHg): 120/76 Capillary Blood Glucose (mg/dl): 573 Reference Range: 80 - 120 mg / dl Electronic Signature(s) Signed: 07/31/2022 1:24:19 PM By: Aleda Grana RCP, RRT, CHT Entered By: Aleda Grana on 07/31/2022 08:43:28

## 2022-08-01 ENCOUNTER — Ambulatory Visit: Payer: 59 | Attending: Infectious Diseases | Admitting: Infectious Diseases

## 2022-08-01 ENCOUNTER — Other Ambulatory Visit
Admission: RE | Admit: 2022-08-01 | Discharge: 2022-08-01 | Disposition: A | Discharge status: Home / Self Care | Payer: 59 | Attending: Infectious Diseases | Admitting: Infectious Diseases

## 2022-08-01 ENCOUNTER — Encounter: Payer: Self-pay | Admitting: Infectious Diseases

## 2022-08-01 VITALS — BP 107/74 | HR 85 | Temp 97.2°F | Ht 70.0 in | Wt 235.0 lb

## 2022-08-01 DIAGNOSIS — E1169 Type 2 diabetes mellitus with other specified complication: Secondary | ICD-10-CM

## 2022-08-01 DIAGNOSIS — E11628 Type 2 diabetes mellitus with other skin complications: Secondary | ICD-10-CM | POA: Insufficient documentation

## 2022-08-01 DIAGNOSIS — B999 Unspecified infectious disease: Secondary | ICD-10-CM | POA: Diagnosis not present

## 2022-08-01 DIAGNOSIS — L089 Local infection of the skin and subcutaneous tissue, unspecified: Secondary | ICD-10-CM | POA: Diagnosis not present

## 2022-08-01 DIAGNOSIS — E1142 Type 2 diabetes mellitus with diabetic polyneuropathy: Secondary | ICD-10-CM

## 2022-08-01 DIAGNOSIS — Z7984 Long term (current) use of oral hypoglycemic drugs: Secondary | ICD-10-CM | POA: Insufficient documentation

## 2022-08-01 DIAGNOSIS — M86172 Other acute osteomyelitis, left ankle and foot: Secondary | ICD-10-CM

## 2022-08-01 DIAGNOSIS — Z794 Long term (current) use of insulin: Secondary | ICD-10-CM | POA: Diagnosis not present

## 2022-08-01 LAB — CBC WITH DIFFERENTIAL/PLATELET
Abs Immature Granulocytes: 0.02 10*3/uL (ref 0.00–0.07)
Basophils Absolute: 0 10*3/uL (ref 0.0–0.1)
Basophils Relative: 0 %
Eosinophils Absolute: 0.2 10*3/uL (ref 0.0–0.5)
Eosinophils Relative: 3 %
HCT: 38.1 % — ABNORMAL LOW (ref 39.0–52.0)
Hemoglobin: 11.9 g/dL — ABNORMAL LOW (ref 13.0–17.0)
Immature Granulocytes: 0 %
Lymphocytes Relative: 37 %
Lymphs Abs: 2.4 10*3/uL (ref 0.7–4.0)
MCH: 26 pg (ref 26.0–34.0)
MCHC: 31.2 g/dL (ref 30.0–36.0)
MCV: 83.2 fL (ref 80.0–100.0)
Monocytes Absolute: 0.5 10*3/uL (ref 0.1–1.0)
Monocytes Relative: 8 %
Neutro Abs: 3.3 10*3/uL (ref 1.7–7.7)
Neutrophils Relative %: 52 %
Platelets: 323 10*3/uL (ref 150–400)
RBC: 4.58 MIL/uL (ref 4.22–5.81)
RDW: 14.9 % (ref 11.5–15.5)
WBC: 6.5 10*3/uL (ref 4.0–10.5)
nRBC: 0 % (ref 0.0–0.2)

## 2022-08-01 LAB — C-REACTIVE PROTEIN: CRP: <0.5 mg/dL (ref <1.0)

## 2022-08-01 LAB — COMPREHENSIVE METABOLIC PANEL
ALT: 16 U/L (ref 0–44)
AST: 15 U/L (ref 15–41)
Albumin: 3.4 g/dL — ABNORMAL LOW (ref 3.5–5.0)
Alkaline Phosphatase: 61 U/L (ref 38–126)
Anion gap: 5 (ref 5–15)
BUN: 20 mg/dL (ref 6–20)
CO2: 28 mmol/L (ref 22–32)
Calcium: 8.9 mg/dL (ref 8.9–10.3)
Chloride: 109 mmol/L (ref 98–111)
Creatinine, Ser: 1.23 mg/dL (ref 0.61–1.24)
GFR, Estimated: >60 mL/min (ref >60)
Glucose, Bld: 147 mg/dL — ABNORMAL HIGH (ref 70–99)
Potassium: 4.2 mmol/L (ref 3.5–5.1)
Sodium: 142 mmol/L (ref 135–145)
Total Bilirubin: 0.3 mg/dL (ref 0.3–1.2)
Total Protein: 7.2 g/dL (ref 6.5–8.1)

## 2022-08-01 LAB — SEDIMENTATION RATE: Sed Rate: 41 mm/hr — ABNORMAL HIGH (ref 0–20)

## 2022-08-01 MED ORDER — CEFADROXIL 500 MG PO CAPS: ORAL_CAPSULE | ORAL | 0 refills | Status: DC

## 2022-08-01 MED ORDER — LEVOFLOXACIN 750 MG PO TABS
ORAL_TABLET | ORAL | 0 refills | Status: DC
Start: 2022-08-01 — End: 2022-08-29

## 2022-08-01 NOTE — Patient Instructions (Signed)
You are here for left foot infeciton- you are on levaquin, cefadroxil and flagyl- today will do labs- continue antibiotic and HBO- will follow up in 1 month

## 2022-08-01 NOTE — Progress Notes (Unsigned)
NAME: [name]  DOB: 11-Nov-1966  MRN: [id number]  Date/Time: 08/01/2022 9:49 AM  Subjective:   ? [name] is a 56 y.o. with a history of Dm,last ba1c in June was 11.2, peripheral neuropathy with left foot wound for the past 8 months followed at wound clinic is here to see me for the first time He was in [facility] in June 2023 On 6/1 MR left foot showed concern for early osteomyelitis along plantar side of 5th metatarsal, extensive gas in the plantar soft tissue  Taken to OR on 6/2(Ortho, Dr. Jeanett Schlein) for left foot debridement and found to have extensive necrotic soft tissue and muscle, tissue sent for Cx.Klebsiella oxytoca, MSSA, strep gordonii, strep cellulitis, Prevotella species beta-lactamase positive. -He refused amputation- He was followed by [facility] infectious disease was sent on 6 weeks of IV . Cefazolin and metronidazole and levaquin. He had a repeat MRI on 06/29/22 and showed Septic arthritis involving the second, third and fourth MTP joints with associated osteomyelitis involving the metatarsals and proximal phalanges. There may be early involvement of the first MTP joint 3. Osteomyelitis involving the sesamoids of the great toe.  HE was restarted on Po cefadroxil, levaquin , metronidazole- asked to go for amputation- He came to [facility] wouldn clinic and has started hyperbaric oxygen and he is hre to see for another opinion   Past Medical History:  Diagnosis Date   Diabetes mellitus without complication [facility])     Past Surgical History:  Procedure Laterality Date   I & D EXTREMITY Left 05/05/2022   Procedure: LEFT FOOT DEBRIDEMENT;  Surgeon: Newt Minion, MD;  Location: [place];  Service: Orthopedics;  Laterality: Left;   MYRINGOTOMY WITH TUBE PLACEMENT Bilateral 07/20/2022   Procedure: MYRINGOTOMY WITH TUBE PLACEMENT;  Surgeon: Margaretha Sheffield, MD;  Location: [place];  Service: ENT;  Laterality: Bilateral;  Diabetic   TONSILLECTOMY      Social History   Socioeconomic History    Marital status: Single    Spouse name: Not on file   Number of children: Not on file   Years of education: Not on file   Highest education level: Not on file  Occupational History   Not on file  Tobacco Use   Smoking status: Every Day    Packs/day: 0.25    Years: 25.00    Total pack years: 6.25    Types: Cigarettes   Smokeless tobacco: Never   Tobacco comments:    Cutting back - goes through a pack in 4-6 days (Started around age 69)  Vaping Use   Vaping Use: Never used  Substance and Sexual Activity   Alcohol use: No   Drug use: No   Sexual activity: Yes  Other Topics Concern   Not on file  Social History Narrative   Not on file   Social Determinants of Health   Financial Resource Strain: Not on file  Food Insecurity: Not on file  Transportation Needs: Not on file  Physical Activity: Not on file  Stress: Not on file  Social Connections: Not on file  Intimate Partner Violence: Not on file    No family history on file. No Known Allergies I? Current Outpatient Medications  Medication Sig Dispense Refill   acetaminophen (TYLENOL) 500 MG tablet Take 2,000 mg by mouth every 6 (six) hours as needed for mild pain or moderate pain.     atorvastatin (LIPITOR) 20 MG tablet Take 1 tablet (20 mg total) by mouth daily. 90 tablet 1  cefadroxil (DURICEF) 500 MG capsule take 1 capsule by mouth twice daily 60 capsule 0   ciprofloxacin-dexamethasone (CIPRODEX) OTIC suspension Place 4 drops in both ears twice a day for 5 days 7.5 mL 0   Continuous Blood Gluc Transmit (DEXCOM G6 TRANSMITTER) MISC Use to check blood sugar three times daily. Change transmitter once every 909 days. E11.69 1 each 1   Insulin NPH, Human,, Isophane, (HUMULIN N KWIKPEN) 100 UNIT/ML Kiwkpen Inject 20 Units into the skin 2 (two) times daily. 30 mL 6   Insulin Pen Needle 32G X 4 MM MISC Use as directed to inject insulin up to 4 times daily. 100 each 0   levofloxacin (LEVAQUIN) 750 MG tablet take 1 tab by mouth  daily 30 tablet 0   metFORMIN (GLUCOPHAGE) 500 MG tablet Take 1 tablet (500 mg total) by mouth 2 (two) times daily. 180 tablet 1   methocarbamol (ROBAXIN) 500 MG tablet Take 1 tablet (500 mg total) by mouth every 6 (six) hours as needed for muscle spasms. 30 tablet 0   metroNIDAZOLE (FLAGYL) 500 MG tablet Take 1 tablet (500 mg total) by mouth 2 (two) times daily. 90 tablet 1   No current facility-administered medications for this visit.     Abtx:  Anti-infectives (From admission, onward)    None       REVIEW OF SYSTEMS:  Const: negative fever, negative chills, negative weight loss Eyes: negative diplopia or visual changes, negative eye pain ENT: negative coryza, negative sore throat Resp: negative cough, hemoptysis, dyspnea Cards: negative for chest pain, palpitations, lower extremity edema GU: negative for frequency, dysuria and hematuria GI: Negative for abdominal pain, diarrhea, bleeding, constipation Skin: negative for rash and pruritus Heme: negative for easy bruising and gum/nose bleeding MS: negative for myalgias, arthralgias, back pain and muscle weakness Neurolo:negative for headaches, dizziness, vertigo, memory problems  Psych: negative for feelings of anxiety, depression  Endocrine:  diabetes Allergy/Immunology- negative for any medication or food allergies ?  Objective:  VITALS:  BP 107/74   Pulse 85   Temp (!) 97.2 F (36.2 C) (Temporal)   Ht 5' 10"  (1.778 m)   Wt 235 lb (106.6 kg)   BMI 33.72 kg/m   PHYSICAL EXAM:  General: Alert, cooperative, no distress, appears stated age.  Head: Normocephalic, without obvious abnormality, atraumatic. Eyes: Conjunctivae clear, anicteric sclerae. Pupils are equal ENT Nares normal. No drainage or sinus tenderness. Lips, mucosa, and tongue normal. No Thrush Neck: Supple, symmetrical, no adenopathy, thyroid: non tender no carotid bruit and no JVD. Back: No CVA tenderness. Lungs: Clear to auscultation bilaterally. No  Wheezing or Rhonchi. No rales. Heart: Regular rate and rhythm, no murmur, rub or gallop. Abdomen: Soft, non-tender,not distended. Bowel sounds normal. No masses Extremities:  08/01/22   07/10/22   07/04/22   06/20/22   05/18/22   05/09/22   05/04/22    Skin: No rashes or lesions. Or bruising Lymph: Cervical, supraclavicular normal. Neurologic: peripheral neuropathy Pertinent Labs None  ? Impression/Recommendation DM poorly controlled with peripheral neuropathy with left foot  wound with underlying osteomyelitis- debridement in June  Was told to get amputation, wanted to save his foot at all cost- so transferred care ot [facility] wound center- started hyperbaric oxygen- has received a week of it Wound improving  ? Is on cefadroxil, levaquin and flagyl Spoke to [place] [facility] wound clinic Continue current management Follow up 1 month   DM- he needs to get  a PCP Hba1c was 11.2- need better control Discussed  healthy eating, foot care Will get ESR/CRP/CBC/CMP ?foolow up 4 weeks

## 2022-08-02 ENCOUNTER — Encounter (HOSPITAL_BASED_OUTPATIENT_CLINIC_OR_DEPARTMENT_OTHER): Payer: 59 | Admitting: Internal Medicine

## 2022-08-02 DIAGNOSIS — L97524 Non-pressure chronic ulcer of other part of left foot with necrosis of bone: Secondary | ICD-10-CM | POA: Diagnosis not present

## 2022-08-02 DIAGNOSIS — E11621 Type 2 diabetes mellitus with foot ulcer: Secondary | ICD-10-CM

## 2022-08-02 DIAGNOSIS — M86672 Other chronic osteomyelitis, left ankle and foot: Secondary | ICD-10-CM

## 2022-08-02 LAB — GLUCOSE, CAPILLARY
Glucose-Capillary: 266 mg/dL — ABNORMAL HIGH (ref 70–99)
Glucose-Capillary: 232 mg/dL — ABNORMAL HIGH (ref 70–99)

## 2022-08-02 NOTE — Progress Notes (Signed)
SHALEV, HELMINIAK ([id number]) Visit Report for 08/02/2022 Arrival Information Details Patient Name: [name] Date of Service: 08/02/2022 10:00 AM Medical Record Number: [id number] Patient Account Number: [id number] Date of Birth/Sex: Apr 24, 1966 (56 y.o. M) Treating RN: Huel Coventry Primary Care Anwar Sakata: PATIENT, NO Other Clinician: Izetta Dakin Referring Jiali Linney: Duanne Guess Treating Axel Frisk/Extender: Tilda Franco in Treatment: 6 Visit Information History Since Last Visit Added or deleted any medications: No Patient Arrived: Knee Scooter Any new allergies or adverse reactions: No Arrival Time: 10:00 Had a fall or experienced change in No Accompanied By: self activities of daily living that may affect Transfer Assistance: None risk of falls: Patient Identification Verified: Yes Signs or symptoms of abuse/neglect since last visito No Secondary Verification Process Completed: Yes Hospitalized since last visit: No Patient Requires Transmission-Based Precautions: No Implantable device outside of the clinic excluding No Patient Has Alerts: No cellular tissue based products placed in the center since last visit: Has Dressing in Place as Prescribed: Yes Pain Present Now: No Electronic Signature(s) Signed: 08/02/2022 1:42:01 PM By: Aleda Grana RCP, RRT, CHT Entered By: Aleda Grana on 08/02/2022 10:16:58 [name] ([id number]) -------------------------------------------------------------------------------- Encounter Discharge Information Details Patient Name: [name] Date of Service: 08/02/2022 10:00 AM Medical Record Number: [id number] Patient Account Number: [id number] Date of Birth/Sex: [birth year] (56 y.o. M) Treating RN: Huel Coventry Primary Care Marcelia Petersen: PATIENT, NO Other Clinician: Izetta Dakin Referring Shmiel Morton: Duanne Guess Treating Girl Schissler/Extender: Tilda Franco in Treatment: 6 Encounter Discharge Information  Items Discharge Condition: Stable Ambulatory Status: Knee Scooter Discharge Destination: Home Transportation: Private Auto Accompanied By: self Schedule Follow-up Appointment: No Clinical Summary of Care: Notes Patient has an HBO treatment scheduled on 08/03/22 at 10:00 am. Electronic Signature(s) Signed: 08/02/2022 1:42:01 PM By: Aleda Grana RCP, RRT, CHT Entered By: Aleda Grana on 08/02/2022 13:37:24 [name] ([id number]) -------------------------------------------------------------------------------- Vitals Details Patient Name: [name] Date of Service: 08/02/2022 10:00 AM Medical Record Number: [id number] Patient Account Number: [id number] Date of Birth/Sex: 1966/10/01 (56 y.o. M) Treating RN: Huel Coventry Primary Care Jari Dipasquale: PATIENT, NO Other Clinician: Izetta Dakin Referring Roda Lauture: Duanne Guess Treating Deztiny Sarra/Extender: Tilda Franco in Treatment: 6 Vital Signs Time Taken: 10:03 Temperature (F): 98.0 Height (in): 70 Pulse (bpm): 72 Weight (lbs): 230 Respiratory Rate (breaths/min): 16 Body Mass Index (BMI): 33 Blood Pressure (mmHg): 122/64 Capillary Blood Glucose (mg/dl): 672 Reference Range: 80 - 120 mg / dl Electronic Signature(s) Signed: 08/02/2022 1:42:01 PM By: Aleda Grana RCP, RRT, CHT Entered By: Aleda Grana on 08/02/2022 10:17:32

## 2022-08-02 NOTE — Progress Notes (Signed)
KEREM, GILMER ([id number]) Visit Report for 08/02/2022 HBO Details Patient Name: [name], [name] Date of Service: 08/02/2022 10:00 AM Medical Record Number: [id number] Patient Account Number: [id number] Date of Birth/Sex: 1966-01-10 (56 y.o. M) Treating RN: Huel Coventry Primary Care Edis Huish: PATIENT, NO Other Clinician: Izetta Dakin Referring Joelynn Dust: Duanne Guess Treating Marvelene Stoneberg/Extender: Tilda Franco in Treatment: 6 HBO Treatment Course Details Treatment Course Number: 1 Ordering Alize Borrayo: Allen Derry Total Treatments Ordered: 40 HBO Treatment Start Date: 06/19/2022 HBO Indication: Diabetic Ulcer(s) of the Lower Extremity Standard/Conservative Wound Care tried and failed greater than or equal to 30 days Wound #1 Plantar Foot HBO Treatment Details Treatment Number: 7 Patient Type: Outpatient Chamber Type: Monoplace Chamber Serial #: [id number] Treatment Protocol: 2.5 ATA with 90 minutes oxygen, with two 5 minute air breaks Treatment Details Compression Rate Down: 1.5 psi / minute De-Compression Rate Up: 1.5 psi / minute Compress Tx Pressure Air breaks and breathing periods Decompress Decompress Begins Reached (leave unused spaces blank) Begins Ends Chamber Pressure (ATA) 1 2.5 2.5 2.5 2.5 2.5 - - 2.5 1 Clock Time (24 hr) 10:39 10:49 - - - - - - 12:20 12:30 Treatment Length: 111 (minutes) Treatment Segments: 4 Vital Signs Capillary Blood Glucose Reference Range: 80 - 120 mg / dl HBO Diabetic Blood Glucose Intervention Range: <131 mg/dl or >086 mg/dl Time Vitals Blood Respiratory Capillary Blood Glucose Pulse Action Type: Pulse: Temperature: Taken: Pressure: Rate: Glucose (mg/dl): Meter #: Oximetry (%) Taken: Pre 10:03 122/64 72 16 98 266 1 none per protocol Post 12:33 122/72 72 16 98.1 232 1 none per protocol Treatment Response Treatment Toleration: Well Treatment Completion Treatment Completed without Adverse Event Status: HBO Attestation I certify that I  supervised this HBO treatment in accordance with Medicare guidelines. A trained emergency response team is readily Yes available per hospital policies and procedures. Continue HBOT as ordered. Yes Electronic Signature(s) Signed: 08/02/2022 3:18:05 PM By: Geralyn Corwin DO Previous Signature: 08/02/2022 1:42:01 PM Version By: Aleda Grana RCP, RRT, CHT Entered By: Geralyn Corwin on 08/02/2022 15:16:34 [name] ([id number]) -------------------------------------------------------------------------------- HBO Safety Checklist Details Patient Name: [name] Date of Service: 08/02/2022 10:00 AM Medical Record Number: [id number] Patient Account Number: [id number] Date of Birth/Sex: [birth year] (56 y.o. M) Treating RN: Huel Coventry Primary Care Jashawn Floyd: PATIENT, NO Other Clinician: Izetta Dakin Referring Kortlyn Koltz: Duanne Guess Treating Mikenna Bunkley/Extender: Tilda Franco in Treatment: 6 HBO Safety Checklist Items Safety Checklist Consent Form Signed Patient voided / foley secured and emptied When did you last eato 08/01/22 pm Last dose of injectable or oral agent 08/01/22 pm Ostomy pouch emptied and vented if applicable NA All implantable devices assessed, documented and approved NA Intravenous access site secured and place NA Valuables secured Linens and cotton and cotton/polyester blend (less than 51% polyester) Personal oil-based products / skin lotions / body lotions removed Wigs or hairpieces removed NA Smoking or tobacco materials removed NA Books / newspapers / magazines / loose paper removed NA Cologne, aftershave, perfume and deodorant removed Jewelry removed (may wrap wedding band) Make-up removed NA Hair care products removed Battery operated devices (external) removed NA Heating patches and chemical warmers removed NA Titanium eyewear removed NA Nail polish cured greater than 10 hours NA Casting material cured greater than 10  hours NA Hearing aids removed NA Loose dentures or partials removed NA Prosthetics have been removed NA Patient demonstrates correct use of air break device (if applicable) Patient concerns have been addressed Patient grounding bracelet on and cord attached to chamber Specifics for Inpatients (  complete in addition to above) Medication sheet sent with patient Intravenous medications needed or due during therapy sent with patient Drainage tubes (e.g. nasogastric tube or chest tube secured and vented) Endotracheal or Tracheotomy tube secured Cuff deflated of air and inflated with saline Airway suctioned Electronic Signature(s) Signed: 08/02/2022 1:42:01 PM By: Aleda Grana RCP, RRT, CHT Entered By: Aleda Grana on 08/02/2022 10:18:41

## 2022-08-03 ENCOUNTER — Encounter: Payer: 59 | Admitting: Physician Assistant

## 2022-08-03 DIAGNOSIS — L97524 Non-pressure chronic ulcer of other part of left foot with necrosis of bone: Secondary | ICD-10-CM | POA: Diagnosis not present

## 2022-08-03 DIAGNOSIS — E11621 Type 2 diabetes mellitus with foot ulcer: Secondary | ICD-10-CM | POA: Diagnosis not present

## 2022-08-03 LAB — GLUCOSE, CAPILLARY
Glucose-Capillary: 172 mg/dL — ABNORMAL HIGH (ref 70–99)
Glucose-Capillary: 218 mg/dL — ABNORMAL HIGH (ref 70–99)

## 2022-08-03 NOTE — Progress Notes (Addendum)
[name], [name] ([id number]) Visit Report for 08/03/2022 Chief Complaint Document Details Patient Name: [name], [name] Date of Service: 08/03/2022 12:30 PM Medical Record Number: [id number] Patient Account Number: [id number] Date of Birth/Sex: 1966/02/11 (56 y.o. 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 M) Treating RN: Yevonne Pax Primary Care Provider: PATIENT, NO Other Clinician: Referring Provider: Duanne Guess Treating Provider/Extender: Rowan Blase in Treatment: 7 History of Present Illness HPI Description: Notes from [facility] under Dr. America Brown care:   ADMISSION6/15/2023This is a 56 year old poorly controlled type II diabetic (last A1c 11.5%). In January of this year, he presented to the hospital with an abscess in his left foot. He also had a foreign body present. It was removed in the ER and he was admitted to the hospital for IV antibiotics. He was subsequently discharged on oral antibiotics. Due to financial concerns, he has not taken his diabetic medications (metformin and 70/30 insulin) and as a result, presented back to the emergency room on June 1 with worsening findings, including frank pus and osteomyelitis on MRI. BKA was recommended, but he declined. He was taken to the  operating room by Dr. Lajoyce Corners who performed an extensive debridement. Cultures were taken and he was placed on IV antibiotics. He saw infectious disease while in the hospital who prescribed a 6-week course of IV antibiotics including cefazolin as well as oral metronidazole and levofloxacin. The infectious disease provider also indicated that he would benefit from below-knee amputation, but the patient desired another opinion and therefore he has been referred to wound care center for further evaluation and management. ABIs obtained while he was in the hospital demonstrate adequate blood flow. Essentially the entire bottom of the patient's left foot has been removed. The heads of the majority of his metatarsal bones are exposed along with their accompanying tendons. Muscle and fat are exposed as well. The wound surface is fairly dry; the patient reports that he was not given any specific wound care instructions other than to place a dry dressing on it after showering. There is no odor or purulent drainage identified. 05/26/2022: The wound measures a little bit smaller today. There is some good granulation tissue beginning to form on the surface. He still has exposed bone and tendon. There is some slough accumulation. He saw infectious disease earlier this week and was frustrated that they recommended amputation. He is interested in another surgical opinion. He continues to smoke but says that he has cut down. He is using a knee scooter for mobility so that he can stay off of his foot. 06/05/2022: He saw Dr. Lajoyce Corners last week who was supportive of our efforts to try to salvage the foot. I referred him to vascular surgery to see Dr. Chestine Spore but he has not received an appointment yet. He continues on IV antibiotics. He says that after our conversation last week about him being unable to initiate hyperbarics if he was going to continue to smoke, he has not had a cigarette since. He is now at 4 weeks of  conventional management and his insurance was initiated on July 1. Today, the wound continues to show evidence of improvement. There is good granulation tissue forming. The metatarsals are still exposed but actually have some pink periosteum. There is still some slough and fibrinous exudate present. No odor or purulent drainage. We have just been using Dakin's dressing changes for now. Admission to Wound Care Center at [facility]: 06-15-2022 upon evaluation today patient presents for initial inspection here in our clinic though he has been seen by Dr. Lady Gary in [place] up to this point. I did review her notes and records as well they have been using Dakin's moistened gauze dressing which has done a great job at helping to clean out the bottom of his foot. Please see the discourse above for additional information in regard to treatment course up until the patient arrives here in the [place] clinic today. The patient is ready to get started with HBO therapy as soon as possible I do think based on all my review of his records this seems to be appropriate he does have chronic osteomyelitis is also been offered amputation this is obviously a limb salvage operation here. We are trying to prevent him from having to undergo an extensive amputation which would likely be a below-knee amputation which in turn is going to affect his quality of life he wants to try to avoid that if at all possible I think hyperbaric oxygen therapy is his best bet to achieve that goal. Currently he has been on IV Ancef. That actually ends tomorrow. Following that according to notes it appears he supposed to be taking Levaquin and Flagyl for an additional period of time although I am not certain exactly how long that with the. He has been seeing regional physicians infectious disease in [place] and again that so I recommended that he contact today in order to find out what the plan is going forward. His most recent hemoglobin A1c  as noted above was 11.5 on 05-07-2022 he tells me trying to work on that he is also quit smoking as of this point. 06-22-2022 upon evaluation today patient appears to be doing well currently in regard to his wound all things considered. He is showing signs of a lot of new granulation tissue and overall very pleased. He is changing this once a day with the Dakin's moistened gauze dressing. Fortunately I do not see any evidence of infection locally or systemically at this time. We have been trying to get him into the hyperbaric chamber he was having a lot of issues with sinus pressure and we have made referral and called multiple ENT specialist the earliest we have been able to get his August 9 at [facility] ear nose throat. They are going to try to work him in sooner if they have any opportunities to do so. Obviously I think the sooner he can do this the better. We need to get him in hyperbarics as quickly as possible. 7/26; patient presents for follow-up. He has been using Dakin's wet-to-dry dressings and oil emollient dressing over the exposed bone. He currently denies systemic signs of infection. He states he is scheduled to see infectious disease, Dr Thedore Mins next week. She had ordered an MRI of the left foot and he is scheduled to have this done tomorrow. He currently denies systemic signs of infection Including fever/chills, nausea/vomiting increased warmth or erythema to the wound bed or purulent drainage. He started hyperbaric oxygen treatment however did not tolerate this due to sinus pressure.  He is scheduled to see ENT in 2 weeks for evaluation. He is not continuing HBO until he is cleared by ENT. 07-06-2022 patient presents today for follow-up concerning his plantar foot ulcer. The good news is this actually is significantly better compared to previous. Some of the bone which is necrotic is starting to slough off and I am going to perform debridement to clear this away today. Nonetheless I do think  that the patient is going to need hyperbarics and we need to try to get it as soon as possible. He still having a lot of sinus pressure I can even hear the congestion today. He is on 3 different antibiotics cefadroxil, metronidazole, and Levaquin currently for the osteomyelitis. For that reason this should actually help with the infection if he does have a chronic sinusitis I am also going to see about getting him on steroids to try to see if this can benefit him as well. He voiced understanding and he is in agreement with giving that shot. This is probably can to spike his blood sugars which I discussed with him he is going to be seeing his primary care provider later today I want him to let her know [name], [name] ([id number]) as well what is going on and why so that she could be aware but I really do think he needs to take the prednisone. We need to get him in the chamber as soon as possible to try to help with getting this wound to heal and closed so that he does not lose his foot. 07-13-2022 upon evaluation today patient appears to be doing better in regard to his wound he continues to show signs of new granulation am going to perform some debridement today but overall this is doing quite well. 07-20-2022 upon evaluation today patient's wound is actually showing some signs of improvement he does have 1 area on the fifth metatarsal region where there is some necrotic tendon that is noted at this point. Unfortunately this is preventing good granulation and over this point the rest of the metatarsal region is completely covered over with granulation tissue which is great news there is some other slough and biofilm buildup that I will clearway as well. 07-27-2022 upon evaluation today patient appears to be doing well currently in regard to his plantar foot ulcer. He has been tolerating the dressing changes without complication fortunately. I do believe he is making excellent progress and each time I see him  this is better and better is pretty much completely covered as far as any bone exposure is concerned and he still is on the IV antibiotics were still also undergoing hyperbarics at this point that he just got started and is doing quite well with since getting the tubes in his ears.Marland Kitchen 08-03-2022 upon evaluation today patient appears to be doing well in general although has been having a lot of drainage compared to normal. It was actually leaking through his dressing today. He tells me he changed it last night. With that being said he has been up on his feet a lot more which I think is probably a big part of the issue here. Fortunately I do not see any signs of infection locally or systemically. Electronic Signature(s) Signed: 08/03/2022 1:31:23 PM By: Lenda Kelp PA-C Entered By: Lenda Kelp on 08/03/2022 13:31:23 [name], [name] ([id number]) -------------------------------------------------------------------------------- Physical Exam Details Patient Name: [name] Date of Service: 08/03/2022 12:30 PM Medical Record Number: [id number] Patient Account Number: [id number] Date of Birth/Sex: 09-14-1966 (  56 y.o. M) Treating RN: Yevonne Pax Primary Care Provider: PATIENT, NO Other Clinician: Referring Provider: Duanne Guess Treating Provider/Extender: Rowan Blase in Treatment: 7 Constitutional Well-nourished and well-hydrated in no acute distress. Respiratory normal breathing without difficulty. Psychiatric this patient is able to make decisions and demonstrates good insight into disease process. Alert and Oriented x 3. pleasant and cooperative. Notes Upon inspection patient's wound does not seem to be a lot worse is foot is a little bit warmer on the left compared to the right but again there is no significant erythema noted I did examine this quite readily as best I could. With that being said my concern here is that the patient may have some ongoing issues with edema and I think  that could be where some of the warmth is coming from as opposed to the infection getting worse he is having no fevers, chills, nausea, vomiting, or diarrhea no mental confusion otherwise. Electronic Signature(s) Signed: 08/03/2022 1:32:07 PM By: Lenda Kelp PA-C Entered By: Lenda Kelp on 08/03/2022 13:32:07 [name], [name] ([id number]) -------------------------------------------------------------------------------- Physician Orders Details Patient Name: [name] Date of Service: 08/03/2022 12:30 PM Medical Record Number: [id number] Patient Account Number: [id number] Date of Birth/Sex: [birth year] (56 y.o. M) Treating RN: Yevonne Pax Primary Care Provider: PATIENT, NO Other Clinician: Referring Provider: Duanne Guess Treating Provider/Extender: Rowan Blase in Treatment: 7 Verbal / Phone Orders: No Diagnosis Coding ICD-10 Coding Code Description [phone] Other chronic osteomyelitis, left ankle and foot E11.621 Type 2 diabetes mellitus with foot ulcer L97.524 Non-pressure chronic ulcer of other part of left foot with necrosis of bone Follow-up Appointments o Return Appointment in 1 week. Bathing/ Shower/ Hygiene o May shower; gently cleanse wound with antibacterial soap, rinse and pat dry prior to dressing wounds Anesthetic (Use 'Patient Medications' Section for Anesthetic Order Entry) o Lidocaine applied to wound bed Edema Control - Lymphedema / Segmental Compressive Device / Other o Elevate, Exercise Daily and Avoid Standing for Long Periods of Time. o Elevate legs to the level of the heart and pump ankles as often as possible o Elevate leg(s) parallel to the floor when sitting. Hyperbaric Oxygen Therapy o Evaluate for HBO Therapy o Indication and location: - osteomyelitis left foot o 2.5 ATA for 90 Minutes with 2 Five (5) Minute Air Breaks - total 40 treatments o Total # of Treatments: - 40 o Antihistamine 30 minutes prior to HBO Treatment,  difficulty clearing ears. o Finger stick Blood Glucose Pre- and Post- HBOT Treatment. o Follow Hyperbaric Oxygen Glycemia Protocol Wound Treatment Wound #1 - Foot Wound Laterality: Plantar, Left Cleanser: Dakin 16 (oz) 0.25 1 x Per Day/30 Days Discharge Instructions: Use as directed. Primary Dressing: Gauze (DME) (Generic) 1 x Per Day/30 Days Discharge Instructions: moistened with 1/4 Dakins Solution Secondary Dressing: Kerlix 4.5 x 4.1 (in/yd) (DME) (Generic) 1 x Per Day/30 Days Discharge Instructions: Apply Kerlix 4.5 x 4.1 (in/yd) as instructed Secured With: Medipore Tape - 30M Medipore H Soft Cloth Surgical Tape, 2x2 (in/yd) (DME) (Generic) 1 x Per Day/30 Days GLYCEMIA INTERVENTIONS PROTOCOL PRE-HBO GLYCEMIA INTERVENTIONS ACTION INTERVENTION Obtain pre-HBO capillary blood glucose (ensure 1 physician order is in chart). A. Notify HBO physician and await physician orders. 2 If result is 70 mg/dl or below: B. If the result meets the hospital definition of a critical result, follow hospital policy. If result is 71 mg/dl to 213 mg/dl: A. Give patient an 8 ounce Glucerna Shake, an 8 ounce Ensure, or 8 ounces of a Kamel, Jasmon ([id number])  Glucerna/Ensure equivalent dietary supplement*. B. Wait 30 minutes. C. Retest patientos capillary blood glucose (CBG). D. If result greater than or equal to 110 mg/dl, proceed with HBO. If result less than 110 mg/dl, notify HBO physician and consider holding HBO. If result is 131 mg/dl to 161 mg/dl: A. Proceed with HBO. A. Notify HBO physician and await physician orders. B. It is recommended to hold HBO and do If result is 250 mg/dl or greater: blood/urine ketone testing. C. If the result meets the hospital definition of a critical result, follow hospital policy. POST-HBO GLYCEMIA INTERVENTIONS ACTION INTERVENTION Obtain post HBO capillary blood glucose (ensure 1 physician order is in chart). A. Notify HBO physician and await  physician orders. 2 If result is 70 mg/dl or below: B. If the result meets the hospital definition of a critical result, follow hospital policy. A. Give patient an 8 ounce Glucerna Shake, an 8 ounce Ensure, or 8 ounces of a Glucerna/Ensure equivalent dietary supplement*. B. Wait 15 minutes for symptoms of hypoglycemia (i.e. nervousness, anxiety, If result is 71 mg/dl to 096 mg/dl: sweating, chills, clamminess, irritability, confusion, tachycardia or dizziness). C. If patient asymptomatic, discharge patient. If patient symptomatic, repeat capillary blood glucose (CBG) and notify HBO physician. If result is 101 mg/dl to 045 mg/dl: A. Discharge patient. A. Notify HBO physician and await physician orders. B. It is recommended to do blood/urine If result is 250 mg/dl or greater: ketone testing. C. If the result meets the hospital definition of a critical result, follow hospital policy. *Juice or candies are NOT equivalent products. If patient refuses the Glucerna or Ensure, please consult the hospital dietitian for an appropriate substitute. Electronic Signature(s) Signed: 08/08/2022 4:56:20 PM By: Lenda Kelp PA-C Signed: 08/21/2022 2:32:14 PM By: Yevonne Pax RN Previous Signature: 08/03/2022 5:40:29 PM Version By: Lenda Kelp PA-C Previous Signature: 08/03/2022 8:44:03 PM Version By: Yevonne Pax RN Entered By: Yevonne Pax on 08/08/2022 11:32:22 [name], [name] ([id number]) -------------------------------------------------------------------------------- Problem List Details Patient Name: [name] Date of Service: 08/03/2022 12:30 PM Medical Record Number: [id number] Patient Account Number: [id number] Date of Birth/Sex: 05-May-1966 (56 y.o. M) Treating RN: Yevonne Pax Primary Care Provider: PATIENT, NO Other Clinician: Referring Provider: Duanne Guess Treating Provider/Extender: Rowan Blase in Treatment: 7 Active Problems ICD-10 Encounter Code Description  Active Date MDM Diagnosis [phone] Other chronic osteomyelitis, left ankle and foot 06/15/2022 No Yes E11.621 Type 2 diabetes mellitus with foot ulcer 06/15/2022 No Yes L97.524 Non-pressure chronic ulcer of other part of left foot with necrosis of 06/15/2022 No Yes bone Inactive Problems Resolved Problems Electronic Signature(s) Signed: 08/03/2022 12:54:56 PM By: Lenda Kelp PA-C Entered By: Lenda Kelp on 08/03/2022 12:54:56 [name] ([id number]) -------------------------------------------------------------------------------- Progress Note Details Patient Name: [name] Date of Service: 08/03/2022 12:30 PM Medical Record Number: [id number] Patient Account Number: [id number] Date of Birth/Sex: 04-15-1966 (56 y.o. 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 America Brown care:   ADMISSION6/15/2023This is a 56 year old poorly controlled type II diabetic (last A1c 11.5%). In January of this year, he presented to the hospital with an abscess in his left foot. He also had a foreign body present. It was removed in the ER and he was admitted to the hospital for IV antibiotics.  He was subsequently discharged on oral antibiotics. Due to financial concerns, he has not taken his diabetic medications (metformin and 70/30 insulin) and as a result, presented back to the emergency room on June 1 with worsening findings, including frank pus and osteomyelitis on MRI. BKA was recommended, but he declined. He was taken to the operating room by Dr. Lajoyce Corners who performed an extensive debridement. Cultures were taken and he was placed on IV antibiotics. He saw  infectious disease while in the hospital who prescribed a 6-week course of IV antibiotics including cefazolin as well as oral metronidazole and levofloxacin. The infectious disease provider also indicated that he would benefit from below-knee amputation, but the patient desired another opinion and therefore he has been referred to wound care center for further evaluation and management. ABIs obtained while he was in the hospital demonstrate adequate blood flow. Essentially the entire bottom of the patient's left foot has been removed. The heads of the majority of his metatarsal bones are exposed along with their accompanying tendons. Muscle and fat are exposed as well. The wound surface is fairly dry; the patient reports that he was not given any specific wound care instructions other than to place a dry dressing on it after showering. There is no odor or purulent drainage identified. 05/26/2022: The wound measures a little bit smaller today. There is some good granulation tissue beginning to form on the surface. He still has exposed bone and tendon. There is some slough accumulation. He saw infectious disease earlier this week and was frustrated that they recommended amputation. He is interested in another surgical opinion. He continues to smoke but says that he has cut down. He is using a knee scooter for mobility so that he can stay off of his foot. 06/05/2022: He saw Dr. Lajoyce Corners last week who was supportive of our efforts to try to salvage the foot. I referred him to vascular surgery to see Dr. Chestine Spore but he has not received an appointment yet. He continues on IV antibiotics. He says that after our conversation last week about him being unable to initiate hyperbarics if he was going to continue to smoke, he has not had a cigarette since. He is now at 4 weeks of conventional management and his insurance was initiated on July 1. Today, the wound continues to show evidence of improvement. There is good  granulation tissue forming. The metatarsals are still exposed but actually have some pink periosteum. There is still some slough and fibrinous exudate present. No odor or purulent drainage. We have just been using Dakin's dressing changes for now. Admission to Wound Care Center at [facility]: 06-15-2022 upon evaluation today patient presents for initial inspection here in our clinic though he has been seen by Dr. Lady Gary in [place] up to this point. I did review her notes and records as well they have been using Dakin's moistened gauze dressing which has done a great job at helping to clean out the bottom of his foot. Please see the discourse above for additional information in regard to treatment course up until the patient arrives here in the [place] clinic today. The patient is ready to get started with HBO therapy as soon as possible I do think based on all my review of his records this seems to be appropriate he does have chronic osteomyelitis is also been offered amputation this is obviously a limb salvage operation here. We are trying to prevent him from having to undergo an extensive amputation which would likely be a  below-knee amputation which in turn is going to affect his quality of life he wants to try to avoid that if at all possible I think hyperbaric oxygen therapy is his best bet to achieve that goal. Currently he has been on IV Ancef. That actually ends tomorrow. Following that according to notes it appears he supposed to be taking Levaquin and Flagyl for an additional period of time although I am not certain exactly how long that with the. He has been seeing regional physicians infectious disease in [place] and again that so I recommended that he contact today in order to find out what the plan is going forward. His most recent hemoglobin A1c as noted above was 11.5 on 05-07-2022 he tells me trying to work on that he is also quit smoking as of this point. 06-22-2022 upon evaluation  today patient appears to be doing well currently in regard to his wound all things considered. He is showing signs of a lot of new granulation tissue and overall very pleased. He is changing this once a day with the Dakin's moistened gauze dressing. Fortunately I do not see any evidence of infection locally or systemically at this time. We have been trying to get him into the hyperbaric chamber he was having a lot of issues with sinus pressure and we have made referral and called multiple ENT specialist the earliest we have been able to get his August 9 at [facility] ear nose throat. They are going to try to work him in sooner if they have any opportunities to do so. Obviously I think the sooner he can do this the better. We need to get him in hyperbarics as quickly as possible. 7/26; patient presents for follow-up. He has been using Dakin's wet-to-dry dressings and oil emollient dressing over the exposed bone. He currently denies systemic signs of infection. He states he is scheduled to see infectious disease, Dr Thedore MinsSingh next week. She had ordered an MRI of the left foot and he is scheduled to have this done tomorrow. He currently denies systemic signs of infection Including fever/chills, nausea/vomiting increased warmth or erythema to the wound bed or purulent drainage. He started hyperbaric oxygen treatment however did not tolerate this due to sinus pressure. He is scheduled to see ENT in 2 weeks for evaluation. He is not continuing HBO until he is cleared by ENT. 07-06-2022 patient presents today for follow-up concerning his plantar foot ulcer. The good news is this actually is significantly better compared to previous. Some of the bone which is necrotic is starting to slough off and I am going to perform debridement to clear this away today. Matthew JanskyMARTIN, [name] ([id number]) Nonetheless I do think that the patient is going to need hyperbarics and we need to try to get it as soon as possible. He still having a lot  of sinus pressure I can even hear the congestion today. He is on 3 different antibiotics cefadroxil, metronidazole, and Levaquin currently for the osteomyelitis. For that reason this should actually help with the infection if he does have a chronic sinusitis I am also going to see about getting him on steroids to try to see if this can benefit him as well. He voiced understanding and he is in agreement with giving that shot. This is probably can to spike his blood sugars which I discussed with him he is going to be seeing his primary care provider later today I want him to let her know as well what is going on and  why so that she could be aware but I really do think he needs to take the prednisone. We need to get him in the chamber as soon as possible to try to help with getting this wound to heal and closed so that he does not lose his foot. 07-13-2022 upon evaluation today patient appears to be doing better in regard to his wound he continues to show signs of new granulation am going to perform some debridement today but overall this is doing quite well. 07-20-2022 upon evaluation today patient's wound is actually showing some signs of improvement he does have 1 area on the fifth metatarsal region where there is some necrotic tendon that is noted at this point. Unfortunately this is preventing good granulation and over this point the rest of the metatarsal region is completely covered over with granulation tissue which is great news there is some other slough and biofilm buildup that I will clearway as well. 07-27-2022 upon evaluation today patient appears to be doing well currently in regard to his plantar foot ulcer. He has been tolerating the dressing changes without complication fortunately. I do believe he is making excellent progress and each time I see him this is better and better is pretty much completely covered as far as any bone exposure is concerned and he still is on the IV antibiotics  were still also undergoing hyperbarics at this point that he just got started and is doing quite well with since getting the tubes in his ears.Marland Kitchen 08-03-2022 upon evaluation today patient appears to be doing well in general although has been having a lot of drainage compared to normal. It was actually leaking through his dressing today. He tells me he changed it last night. With that being said he has been up on his feet a lot more which I think is probably a big part of the issue here. Fortunately I do not see any signs of infection locally or systemically. Objective Constitutional Well-nourished and well-hydrated in no acute distress. Vitals Time Taken: 12:43 PM, Height: 70 in, Weight: 230 lbs, BMI: 33, Temperature: 98.3 F, Pulse: 66 bpm, Respiratory Rate: 16 breaths/min, Blood Pressure: 124/84 mmHg. Respiratory normal breathing without difficulty. Psychiatric this patient is able to make decisions and demonstrates good insight into disease process. Alert and Oriented x 3. pleasant and cooperative. General Notes: Upon inspection patient's wound does not seem to be a lot worse is foot is a little bit warmer on the left compared to the right but again there is no significant erythema noted I did examine this quite readily as best I could. With that being said my concern here is that the patient may have some ongoing issues with edema and I think that could be where some of the warmth is coming from as opposed to the infection getting worse he is having no fevers, chills, nausea, vomiting, or diarrhea no mental confusion otherwise. Integumentary (Hair, Skin) Wound #1 status is Open. Original cause of wound was Gradually Appeared. The date acquired was: 12/04/2021. The wound has been in treatment 7 weeks. The wound is located on the Left,Plantar Foot. The wound measures 13.7cm length x 7.5cm width x 0.2cm depth; 80.7cm^2 area and 16.14cm^3 volume. There is Fat Layer (Subcutaneous Tissue) exposed.  There is no tunneling or undermining noted. There is a medium amount of serosanguineous drainage noted. There is large (67-100%) pink, pale granulation within the wound bed. There is a small (1-33%) amount of necrotic tissue within the wound bed including Adherent  Slough. Assessment Active Problems ICD-10 Other chronic osteomyelitis, left ankle and foot Type 2 diabetes mellitus with foot ulcer Non-pressure chronic ulcer of other part of left foot with necrosis of bone [name], [name] ([id number]) Plan Follow-up Appointments: Return Appointment in 1 week. Bathing/ Shower/ Hygiene: May shower; gently cleanse wound with antibacterial soap, rinse and pat dry prior to dressing wounds Anesthetic (Use 'Patient Medications' Section for Anesthetic Order Entry): Lidocaine applied to wound bed Edema Control - Lymphedema / Segmental Compressive Device / Other: Elevate, Exercise Daily and Avoid Standing for Long Periods of Time. Elevate legs to the level of the heart and pump ankles as often as possible Elevate leg(s) parallel to the floor when sitting. Hyperbaric Oxygen Therapy: Evaluate for HBO Therapy Indication and location: - osteomyelitis left foot 2.5 ATA for 90 Minutes with 2 Five (5) Minute Air Breaks - total 40 treatments Total # of Treatments: - 40 Antihistamine 30 minutes prior to HBO Treatment, difficulty clearing ears. Finger stick Blood Glucose Pre- and Post- HBOT Treatment. Follow Hyperbaric Oxygen Glycemia Protocol WOUND #1: - Foot Wound Laterality: Plantar, Left Cleanser: Dakin 16 (oz) 0.25 1 x Per Day/30 Days Discharge Instructions: Use as directed. Primary Dressing: Gauze (Generic) 1 x Per Day/30 Days Discharge Instructions: moistened with 1/4 Dakins Solution Secondary Dressing: ABD Pad 5x9 (in/in) (Generic) 1 x Per Day/30 Days Discharge Instructions: Cover with ABD pad Secondary Dressing: Kerlix 4.5 x 4.1 (in/yd) (Generic) 1 x Per Day/30 Days Discharge Instructions: Apply  Kerlix 4.5 x 4.1 (in/yd) as instructed Secured With: Medipore Tape - 56M Medipore H Soft Cloth Surgical Tape, 2x2 (in/yd) (Generic) 1 x Per Day/30 Days 1. Based on what I am seeing I would recommend currently that we go ahead and have the patient continue to monitor for any signs of infection or worsening. Obviously if anything changes he should contact the office and let me know otherwise we will continue with the hyperbarics and subsequently we are also going to continue with the Dakin's moistened gauze dressing to the wound bed followed by the gauze and ABD pads were also putting the Zetuvit on right now due to the excessive drainage. 2. He is then to continue to cover this with roll gauze and I did advise him to go home and elevate his legs much as he can over the next few days. We will see patient back for reevaluation in 1 week here in the clinic. If anything worsens or changes patient will contact our office for additional recommendations. Electronic Signature(s) Signed: 08/03/2022 1:32:48 PM By: Lenda Kelp PA-C Entered By: Lenda Kelp on 08/03/2022 13:32:48 [name] ([id number]) -------------------------------------------------------------------------------- SuperBill Details Patient Name: [name] Date of Service: 08/03/2022 Medical Record Number: [id number] Patient Account Number: [id number] Date of Birth/Sex: 03/08/1966 (56 y.o. M) Treating RN: Yevonne Pax Primary Care Provider: PATIENT, NO Other Clinician: Referring Provider: Duanne Guess Treating Provider/Extender: Rowan Blase in Treatment: 7 Diagnosis Coding ICD-10 Codes Code Description [phone] Other chronic osteomyelitis, left ankle and foot E11.621 Type 2 diabetes mellitus with foot ulcer L97.524 Non-pressure chronic ulcer of other part of left foot with necrosis of bone Facility Procedures CPT4 Code: [id number] Description: [phone] - WOUND CARE VISIT-LEV 2 EST PT Modifier: Quantity: 1 Physician  Procedures CPT4 Code: [id number] Description: 99214 - WC PHYS LEVEL 4 - EST PT Modifier: Quantity: 1 CPT4 Code: Description: ICD-10 Diagnosis Description M86.672 Other chronic osteomyelitis, left ankle and foot E11.621 Type 2 diabetes mellitus with foot ulcer L97.524 Non-pressure chronic ulcer of other part  of left foot with necrosis of Modifier: bone Quantity: Electronic Signature(s) Signed: 08/03/2022 1:33:07 PM By: Lenda Kelp PA-C Previous Signature: 08/03/2022 1:10:20 PM Version By: Yevonne Pax RN Entered By: Lenda Kelp on 08/03/2022 13:33:06

## 2022-08-03 NOTE — Progress Notes (Signed)
IDRISS, QUACKENBUSH ([id number]) Visit Report for 08/03/2022 Arrival Information Details Patient Name: [name], [name] Date of Service: 08/03/2022 10:00 AM Medical Record Number: [id number] Patient Account Number: [id number] Date of Birth/Sex: 1965/12/22 (56 y.o. M) Treating RN: Huel Coventry Primary Care Lakiya Cottam: PATIENT, NO Other Clinician: Izetta Dakin Referring Zamariyah Furukawa: Duanne Guess Treating Vyolet Sakuma/Extender: Rowan Blase in Treatment: 7 Visit Information History Since Last Visit Added or deleted any medications: No Patient Arrived: Knee Scooter Any new allergies or adverse reactions: No Arrival Time: 10:10 Had a fall or experienced change in No Accompanied By: self activities of daily living that may affect Transfer Assistance: None risk of falls: Patient Identification Verified: Yes Signs or symptoms of abuse/neglect since last visito No Secondary Verification Process Completed: Yes Hospitalized since last visit: No Patient Requires Transmission-Based Precautions: No Implantable device outside of the clinic excluding No Patient Has Alerts: No cellular tissue based products placed in the center since last visit: Has Dressing in Place as Prescribed: Yes Pain Present Now: No Electronic Signature(s) Signed: 08/03/2022 2:04:01 PM By: Aleda Grana RCP, RRT, CHT Entered By: Aleda Grana on 08/03/2022 12:56:35 [name] ([id number]) -------------------------------------------------------------------------------- Encounter Discharge Information Details Patient Name: [name] Date of Service: 08/03/2022 10:00 AM Medical Record Number: [id number] Patient Account Number: [id number] Date of Birth/Sex: 1966/03/04 (56 y.o. M) Treating RN: Huel Coventry Primary Care Zuhair Lariccia: PATIENT, NO Other Clinician: Izetta Dakin Referring Orrin Yurkovich: Duanne Guess Treating Abrahm Mancia/Extender: Rowan Blase in Treatment: 7 Encounter Discharge Information  Items Discharge Condition: Stable Ambulatory Status: Knee Scooter Discharge Destination: Home Transportation: Private Auto Accompanied By: self Schedule Follow-up Appointment: Yes Clinical Summary of Care: Notes Patient has an HBO treatment scheduled on 08/04/22 at 9:45 am. Electronic Signature(s) Signed: 08/03/2022 2:04:01 PM By: Aleda Grana RCP, RRT, CHT Entered By: Aleda Grana on 08/03/2022 13:59:03 [name] ([id number]) -------------------------------------------------------------------------------- Vitals Details Patient Name: [name] Date of Service: 08/03/2022 10:00 AM Medical Record Number: [id number] Patient Account Number: [id number] Date of Birth/Sex: Aug 15, 1966 (56 y.o. M) Treating RN: Huel Coventry Primary Care Bernie Fobes: PATIENT, NO Other Clinician: Izetta Dakin Referring Seletha Zimmermann: Duanne Guess Treating Yailene Badia/Extender: Rowan Blase in Treatment: 7 Vital Signs Time Taken: 10:16 Temperature (F): 98.6 Height (in): 70 Pulse (bpm): 84 Weight (lbs): 230 Respiratory Rate (breaths/min): 16 Body Mass Index (BMI): 33 Blood Pressure (mmHg): 124/66 Capillary Blood Glucose (mg/dl): 834 Reference Range: 80 - 120 mg / dl Electronic Signature(s) Signed: 08/03/2022 2:04:01 PM By: Aleda Grana RCP, RRT, CHT Entered By: Aleda Grana on 08/03/2022 12:57:28

## 2022-08-04 ENCOUNTER — Telehealth: Payer: Self-pay

## 2022-08-04 ENCOUNTER — Encounter: Payer: 59 | Admitting: Physician Assistant

## 2022-08-04 NOTE — Progress Notes (Signed)
JAKIM, DRAPEAU ([id number]) Visit Report for 08/03/2022 HBO Details Patient Name: [name], [name] Date of Service: 08/03/2022 10:00 AM Medical Record Number: [id number] Patient Account Number: [id number] Date of Birth/Sex: March 20, 1966 (56 y.o. M) Treating RN: Huel Coventry Primary Care Kieana Livesay: PATIENT, NO Other Clinician: Izetta Dakin Referring Donnel Venuto: Duanne Guess Treating Bruchy Mikel/Extender: Rowan Blase in Treatment: 7 HBO Treatment Course Details Treatment Course Number: 1 Ordering Biana Haggar: Allen Derry Total Treatments Ordered: 40 HBO Treatment Start Date: 06/19/2022 HBO Indication: Diabetic Ulcer(s) of the Lower Extremity Standard/Conservative Wound Care tried and failed greater than or equal to 30 days Wound #1 Plantar Foot HBO Treatment Details Treatment Number: 8 Patient Type: Outpatient Chamber Type: Monoplace Chamber Serial #: [id number] Treatment Protocol: 2.5 ATA with 90 minutes oxygen, with two 5 minute air breaks Treatment Details Compression Rate Down: 1.5 psi / minute De-Compression Rate Up: 1.5 psi / minute Compress Tx Pressure Air breaks and breathing periods Decompress Decompress Begins Reached (leave unused spaces blank) Begins Ends Chamber Pressure (ATA) 1 2.5 2.5 2.5 2.5 2.5 - - 2.5 1 Clock Time (24 hr) 10:38 10:48 - - - - - - 12:18 12:28 Treatment Length: 110 (minutes) Treatment Segments: 4 Vital Signs Capillary Blood Glucose Reference Range: 80 - 120 mg / dl HBO Diabetic Blood Glucose Intervention Range: <131 mg/dl or >093 mg/dl Time Vitals Blood Respiratory Capillary Blood Glucose Pulse Action Type: Pulse: Temperature: Taken: Pressure: Rate: Glucose (mg/dl): Meter #: Oximetry (%) Taken: Pre 10:16 124/66 84 16 98.6 172 1 none per protocol Post 12:39 124/84 66 16 98.3 218 1 none per protocol Treatment Response Treatment Toleration: Well Treatment Completion Treatment Completed without Adverse Event Status: Electronic Signature(s) Signed:  08/03/2022 2:04:01 PM By: Aleda Grana RCP, RRT, CHT Signed: 08/03/2022 5:40:29 PM By: Lenda Kelp PA-C Entered By: Aleda Grana on 08/03/2022 13:58:07 [name] ([id number]) -------------------------------------------------------------------------------- HBO Safety Checklist Details Patient Name: [name] Date of Service: 08/03/2022 10:00 AM Medical Record Number: [id number] Patient Account Number: [id number] Date of Birth/Sex: June 16, 1966 (56 y.o. M) Treating RN: Huel Coventry Primary Care Bennett Ram: PATIENT, NO Other Clinician: Izetta Dakin Referring Joab Carden: Duanne Guess Treating Marianela Mandrell/Extender: Rowan Blase in Treatment: 7 HBO Safety Checklist Items Safety Checklist Consent Form Signed Patient voided / foley secured and emptied When did you last eato 07:00 am Last dose of injectable or oral agent 08/02/22 pm Ostomy pouch emptied and vented if applicable NA All implantable devices assessed, documented and approved NA Intravenous access site secured and place NA Valuables secured Linens and cotton and cotton/polyester blend (less than 51% polyester) Personal oil-based products / skin lotions / body lotions removed Wigs or hairpieces removed NA Smoking or tobacco materials removed NA Books / newspapers / magazines / loose paper removed NA Cologne, aftershave, perfume and deodorant removed Jewelry removed (may wrap wedding band) Make-up removed NA Hair care products removed Battery operated devices (external) removed NA Heating patches and chemical warmers removed NA Titanium eyewear removed NA Nail polish cured greater than 10 hours NA Casting material cured greater than 10 hours NA Hearing aids removed NA Loose dentures or partials removed NA Prosthetics have been removed NA Patient demonstrates correct use of air break device (if applicable) Patient concerns have been addressed Patient grounding bracelet on and cord  attached to chamber Specifics for Inpatients (complete in addition to above) Medication sheet sent with patient Intravenous medications needed or due during therapy sent with patient Drainage tubes (e.g. nasogastric tube or chest tube secured and vented) Endotracheal or Tracheotomy  tube secured Cuff deflated of air and inflated with saline Airway suctioned Electronic Signature(s) Signed: 08/03/2022 2:04:01 PM By: Aleda Grana RCP, RRT, CHT Entered By: Aleda Grana on 08/03/2022 13:56:22

## 2022-08-04 NOTE — Telephone Encounter (Signed)
Spoke with [name], relayed that per Dr. Rivka Safer lab work was good and he should continue antibiotics and follow up with her as scheduled. Let him know that refills of his antibiotics were sent on 8/29. Patient verbalized understanding and has no further questions.   Sandie Ano, RN

## 2022-08-04 NOTE — Telephone Encounter (Signed)
-----   Message from Lynn Ito, MD sent at 08/04/2022 10:48 AM EDT ----- Please let ho=I'm know that the labs drawn during his visit with me are good- he will continue antibiotics and follow up with me as scheduled. Thx  ----- Message ----- From: Leory Plowman, Lab In [facility] Sent: 08/01/2022  10:38 AM EDT To: Lynn Ito, MD

## 2022-08-04 NOTE — Progress Notes (Signed)
CARRY, ORTEZ ([id number]) Visit Report for 08/03/2022 Arrival Information Details Patient Name: [name], [name] Date of Service: 08/03/2022 12:30 PM Medical Record Number: [id number] Patient Account Number: [id number] Date of Birth/Sex: 1966/09/02 (56 y.o. M) Treating RN: Carlene Coria Primary Care Ezekiah Massie: PATIENT, NO Other Clinician: Referring Olivier Frayre: Fredirick Maudlin Treating Cris Gibby/Extender: Skipper Cliche in Treatment: 7 Visit Information History Since Last Visit All ordered tests and consults were completed: No Patient Arrived: Knee Scooter Added or deleted any medications: No Arrival Time: 12:42 Any new allergies or adverse reactions: No Accompanied By: self Had a fall or experienced change in No Transfer Assistance: None activities of daily living that may affect Patient Identification Verified: Yes risk of falls: Secondary Verification Process Completed: Yes Signs or symptoms of abuse/neglect since last visito No Patient Requires Transmission-Based Precautions: No Hospitalized since last visit: No Patient Has Alerts: No Implantable device outside of the clinic excluding No cellular tissue based products placed in the center since last visit: Has Dressing in Place as Prescribed: Yes Pain Present Now: No Electronic Signature(s) Signed: 08/03/2022 8:44:03 PM By: Carlene Coria RN Entered By: Carlene Coria on 08/03/2022 12:43:12 [name] ([id number]) -------------------------------------------------------------------------------- Clinic Level of Care Assessment Details Patient Name: DURWARD, MATRANGA Date of Service: 08/03/2022 12:30 PM Medical Record Number: [id number] Patient Account Number: [id number] Date of Birth/Sex: February 25, 1966 (55 y.o. M) Treating RN: Carlene Coria Primary Care Wilmina Maxham: PATIENT, NO Other Clinician: Referring Yordan Martindale: Fredirick Maudlin Treating Wilmary Levit/Extender: Skipper Cliche in Treatment: 7 Clinic Level of Care Assessment Items TOOL 4 Quantity  Score X - Use when only an EandM is performed on FOLLOW-UP visit 1 0 ASSESSMENTS - Nursing Assessment / Reassessment X - Reassessment of Co-morbidities (includes updates in patient status) 1 10 X- 1 5 Reassessment of Adherence to Treatment Plan ASSESSMENTS - Wound and Skin Assessment / Reassessment X - Simple Wound Assessment / Reassessment - one wound 1 5 []  - 0 Complex Wound Assessment / Reassessment - multiple wounds []  - 0 Dermatologic / Skin Assessment (not related to wound area) ASSESSMENTS - Focused Assessment []  - Circumferential Edema Measurements - multi extremities 0 []  - 0 Nutritional Assessment / Counseling / Intervention []  - 0 Lower Extremity Assessment (monofilament, tuning fork, pulses) []  - 0 Peripheral Arterial Disease Assessment (using hand held doppler) ASSESSMENTS - Ostomy and/or Continence Assessment and Care []  - Incontinence Assessment and Management 0 []  - 0 Ostomy Care Assessment and Management (repouching, etc.) PROCESS - Coordination of Care X - Simple Patient / Family Education for ongoing care 1 15 []  - 0 Complex (extensive) Patient / Family Education for ongoing care []  - 0 Staff obtains Programmer, systems, Records, Test Results / Process Orders []  - 0 Staff telephones HHA, Nursing Homes / Clarify orders / etc []  - 0 Routine Transfer to another Facility (non-emergent condition) []  - 0 Routine Hospital Admission (non-emergent condition) []  - 0 New Admissions / Biomedical engineer / Ordering NPWT, Apligraf, etc. []  - 0 Emergency Hospital Admission (emergent condition) X- 1 10 Simple Discharge Coordination []  - 0 Complex (extensive) Discharge Coordination PROCESS - Special Needs []  - Pediatric / Minor Patient Management 0 []  - 0 Isolation Patient Management []  - 0 Hearing / Language / Visual special needs []  - 0 Assessment of Community assistance (transportation, D/C planning, etc.) []  - 0 Additional assistance / Altered mentation []  -  0 Support Surface(s) Assessment (bed, cushion, seat, etc.) INTERVENTIONS - Wound Cleansing / Measurement Merrow, Wolfgang ([id number]) X- 1 5 Simple Wound Cleansing - one wound []  -  0 Complex Wound Cleansing - multiple wounds X- 1 5 Wound Imaging (photographs - any number of wounds) []  - 0 Wound Tracing (instead of photographs) X- 1 5 Simple Wound Measurement - one wound []  - 0 Complex Wound Measurement - multiple wounds INTERVENTIONS - Wound Dressings X - Small Wound Dressing one or multiple wounds 1 10 []  - 0 Medium Wound Dressing one or multiple wounds []  - 0 Large Wound Dressing one or multiple wounds []  - 0 Application of Medications - topical []  - 0 Application of Medications - injection INTERVENTIONS - Miscellaneous []  - External ear exam 0 []  - 0 Specimen Collection (cultures, biopsies, blood, body fluids, etc.) []  - 0 Specimen(s) / Culture(s) sent or taken to Lab for analysis []  - 0 Patient Transfer (multiple staff / Civil Service fast streamer / Similar devices) []  - 0 Simple Staple / Suture removal (25 or less) []  - 0 Complex Staple / Suture removal (26 or more) []  - 0 Hypo / Hyperglycemic Management (close monitor of Blood Glucose) []  - 0 Ankle / Brachial Index (ABI) - do not check if billed separately X- 1 5 Vital Signs Has the patient been seen at the hospital within the last three years: Yes Total Score: 75 Level Of Care: New/Established - Level 2 Electronic Signature(s) Signed: 08/03/2022 8:44:03 PM By: Carlene Coria RN Entered By: Carlene Coria on 08/03/2022 13:10:15 [name] ([id number]) -------------------------------------------------------------------------------- Encounter Discharge Information Details Patient Name: [name] Date of Service: 08/03/2022 12:30 PM Medical Record Number: [id number] Patient Account Number: [id number] Date of Birth/Sex: Mar 18, 1966 (56 y.o. M) Treating RN: Carlene Coria Primary Care Saarah Dewing: PATIENT, NO Other Clinician: Referring  Ethel Veronica: Fredirick Maudlin Treating Mychal Durio/Extender: Skipper Cliche in Treatment: 7 Encounter Discharge Information Items Discharge Condition: Stable Ambulatory Status: Ambulatory Discharge Destination: Home Transportation: Private Auto Accompanied By: self Schedule Follow-up Appointment: Yes Clinical Summary of Care: Electronic Signature(s) Signed: 08/03/2022 1:11:01 PM By: Carlene Coria RN Entered By: Carlene Coria on 08/03/2022 13:11:01 [name] ([id number]) -------------------------------------------------------------------------------- Lower Extremity Assessment Details Patient Name: [name] Date of Service: 08/03/2022 12:30 PM Medical Record Number: [id number] Patient Account Number: [id number] Date of Birth/Sex: [birth year] (56 y.o. M) Treating RN: Carlene Coria Primary Care Ader Fritze: PATIENT, NO Other Clinician: Referring Dariyah Garduno: Fredirick Maudlin Treating Anh Mangano/Extender: Skipper Cliche in Treatment: 7 Edema Assessment Assessed: [Left: No] [Right: No] Edema: [Left: Ye] [Right: s] Calf Left: Right: Point of Measurement: 32 cm From Medial Instep 39 cm Ankle Left: Right: Point of Measurement: 11 cm From Medial Instep 24 cm Vascular Assessment Pulses: Dorsalis Pedis Palpable: [Left:Yes] Electronic Signature(s) Signed: 08/03/2022 8:44:03 PM By: Carlene Coria RN Entered By: Carlene Coria on 08/03/2022 12:53:16 [name] ([id number]) -------------------------------------------------------------------------------- Multi Wound Chart Details Patient Name: [name] Date of Service: 08/03/2022 12:30 PM Medical Record Number: [id number] Patient Account Number: [id number] Date of Birth/Sex: 1966/09/05 (56 y.o. M) Treating RN: Carlene Coria Primary Care Jathan Balling: PATIENT, NO Other Clinician: Referring Pauline Trainer: Fredirick Maudlin Treating Osmin Welz/Extender: Skipper Cliche in Treatment: 7 Vital Signs Height(in): 70 Pulse(bpm): 67 Weight(lbs): 230 Blood  Pressure(mmHg): 124/84 Body Mass Index(BMI): 33 Temperature(F): 98.3 Respiratory Rate(breaths/min): 16 Photos: [N/A:N/A] Wound Location: Left, Plantar Foot N/A N/A Wounding Event: Gradually Appeared N/A N/A Primary Etiology: Diabetic Wound/Ulcer of the Lower N/A N/A Extremity Comorbid History: Type II Diabetes N/A N/A Date Acquired: 12/04/2021 N/A N/A Weeks of Treatment: 7 N/A N/A Wound Status: Open N/A N/A Wound Recurrence: No N/A N/A Pending Amputation on Yes N/A N/A Presentation: Measurements L x W x D (cm) 13.7x7.5x0.2  N/A N/A Area (cm) : 80.7 N/A N/A Volume (cm) : 16.14 N/A N/A % Reduction in Area: 19.70% N/A N/A % Reduction in Volume: 67.90% N/A N/A Classification: Grade 4 N/A N/A Exudate Amount: Medium N/A N/A Exudate Type: Serosanguineous N/A N/A Exudate Color: red, brown N/A N/A Granulation Amount: Large (67-100%) N/A N/A Granulation Quality: Pink, Pale N/A N/A Necrotic Amount: Small (1-33%) N/A N/A Exposed Structures: Fat Layer (Subcutaneous Tissue): N/A N/A Yes Fascia: No Tendon: No Muscle: No Joint: No Bone: No Epithelialization: None N/A N/A Treatment Notes Electronic Signature(s) Signed: 08/03/2022 8:44:03 PM By: Carlene Coria RN Entered By: Carlene Coria on 08/03/2022 12:56:25 [name] ([id number]) -------------------------------------------------------------------------------- Multi-Disciplinary Care Plan Details Patient Name: [name] Date of Service: 08/03/2022 12:30 PM Medical Record Number: [id number] Patient Account Number: [id number] Date of Birth/Sex: 02-14-1966 (56 y.o. M) Treating RN: Carlene Coria Primary Care Allisha Harter: PATIENT, NO Other Clinician: Referring Kierra Jezewski: Fredirick Maudlin Treating Mihail Prettyman/Extender: Skipper Cliche in Treatment: 7 Active Inactive Osteomyelitis Nursing Diagnoses: Infection: osteomyelitis Knowledge deficit related to disease process and management Potential for infection:  osteomyelitis Goals: Diagnostic evaluation for osteomyelitis completed as ordered Date Initiated: 06/28/2022 Date Inactivated: 08/03/2022 Target Resolution Date: 06/28/2022 Goal Status: Met Patient/caregiver will verbalize understanding of disease process and disease management Date Initiated: 06/28/2022 Target Resolution Date: 06/28/2022 Goal Status: Active Patient's osteomyelitis will resolve Date Initiated: 06/28/2022 Target Resolution Date: 06/28/2022 Goal Status: Active Signs and symptoms for osteomyelitis will be recognized and promptly addressed Date Initiated: 06/28/2022 Date Inactivated: 08/03/2022 Target Resolution Date: 06/28/2022 Goal Status: Met Interventions: Assess for signs and symptoms of osteomyelitis resolution every visit Provide education on osteomyelitis Screen for HBO Notes: Wound/Skin Impairment Nursing Diagnoses: Knowledge deficit related to ulceration/compromised skin integrity Goals: Patient/caregiver will verbalize understanding of skin care regimen Date Initiated: 06/15/2022 Date Inactivated: 08/03/2022 Target Resolution Date: 07/16/2022 Goal Status: Met Ulcer/skin breakdown will have a volume reduction of 30% by week 4 Date Initiated: 06/15/2022 Date Inactivated: 08/03/2022 Target Resolution Date: 07/16/2022 Goal Status: Unmet Unmet Reason: comorbities Ulcer/skin breakdown will have a volume reduction of 50% by week 8 Date Initiated: 06/15/2022 Target Resolution Date: 08/16/2022 Goal Status: Active Ulcer/skin breakdown will have a volume reduction of 80% by week 12 Date Initiated: 06/15/2022 Target Resolution Date: 09/15/2022 Goal Status: Active Ulcer/skin breakdown will heal within 14 weeks Date Initiated: 06/15/2022 Target Resolution Date: 10/16/2022 Goal Status: Active Interventions: Assess patient/caregiver ability to obtain necessary supplies Assess patient/caregiver ability to perform ulcer/skin care regimen upon admission and as needed LUISMIGUEL, LAMERE ([id number]) Assess ulceration(s) every visit Notes: Electronic Signature(s) Signed: 08/03/2022 8:44:03 PM By: Carlene Coria RN Entered By: Carlene Coria on 08/03/2022 12:54:04 [name] ([id number]) -------------------------------------------------------------------------------- Pain Assessment Details Patient Name: [name] Date of Service: 08/03/2022 12:30 PM Medical Record Number: [id number] Patient Account Number: [id number] Date of Birth/Sex: 05/14/1966 (56 y.o. M) Treating RN: Carlene Coria Primary Care Zidane Renner: PATIENT, NO Other Clinician: Referring Arisa Congleton: Fredirick Maudlin Treating Yusuf Yu/Extender: Skipper Cliche in Treatment: 7 Active Problems Location of Pain Severity and Description of Pain Patient Has Paino No Site Locations Pain Management and Medication Current Pain Management: Electronic Signature(s) Signed: 08/03/2022 8:44:03 PM By: Carlene Coria RN Entered By: Carlene Coria on 08/03/2022 12:43:53 [name] ([id number]) -------------------------------------------------------------------------------- Patient/Caregiver Education Details Patient Name: [name] Date of Service: 08/03/2022 12:30 PM Medical Record Number: [id number] Patient Account Number: [id number] Date of Birth/Gender: 1966/03/05 (56 y.o. M) Treating RN: Carlene Coria Primary Care Physician: PATIENT, NO Other Clinician: Referring Physician: Fredirick Maudlin Treating Physician/Extender: Skipper Cliche in  Treatment: 7 Education Assessment Education Provided To: Patient Education Topics Provided Infection: Methods: Explain/Verbal Responses: State content correctly Electronic Signature(s) Signed: 08/03/2022 8:44:03 PM By: Carlene Coria RN Entered By: Carlene Coria on 08/03/2022 13:10:29 [name] ([id number]) -------------------------------------------------------------------------------- Wound Assessment Details Patient Name: [name] Date of Service: 08/03/2022 12:30  PM Medical Record Number: [id number] Patient Account Number: [id number] Date of Birth/Sex: 04-Aug-1966 (56 y.o. M) Treating RN: Carlene Coria Primary Care Dailey Buccheri: PATIENT, NO Other Clinician: Referring Earnestine Shipp: Fredirick Maudlin Treating Mareo Portilla/Extender: Skipper Cliche in Treatment: 7 Wound Status Wound Number: 1 Primary Etiology: Diabetic Wound/Ulcer of the Lower Extremity Wound Location: Left, Plantar Foot Wound Status: Open Wounding Event: Gradually Appeared Comorbid History: Type II Diabetes Date Acquired: 12/04/2021 Weeks Of Treatment: 7 Clustered Wound: No Pending Amputation On Presentation Photos Wound Measurements Length: (cm) 13.7 % Width: (cm) 7.5 % Depth: (cm) 0.2 E Area: (cm) 80.7 Volume: (cm) 16.14 Reduction in Area: 19.7% Reduction in Volume: 67.9% pithelialization: None Tunneling: No Undermining: No Wound Description Classification: Grade 4 Exudate Amount: Medium Exudate Type: Serosanguineous Exudate Color: red, brown Foul Odor After Cleansing: No Slough/Fibrino Yes Wound Bed Granulation Amount: Large (67-100%) Exposed Structure Granulation Quality: Pink, Pale Fascia Exposed: No Necrotic Amount: Small (1-33%) Fat Layer (Subcutaneous Tissue) Exposed: Yes Necrotic Quality: Adherent Slough Tendon Exposed: No Muscle Exposed: No Joint Exposed: No Bone Exposed: No Treatment Notes Wound #1 (Foot) Wound Laterality: Plantar, Left Cleanser Dakin 16 (oz) 0.25 Discharge Instruction: Use as directed. Peri-Wound Care LADDIE, MATH ([id number]) Topical Primary Dressing Gauze Discharge Instruction: moistened with 1/4 Dakins Solution Secondary Dressing ABD Pad 5x9 (in/in) Discharge Instruction: Cover with ABD pad Kerlix 4.5 x 4.1 (in/yd) Discharge Instruction: Apply Kerlix 4.5 x 4.1 (in/yd) as instructed Secured With [place] H Soft Cloth Surgical Tape, 2x2 (in/yd) Compression Wrap Compression Stockings Add-Ons Electronic  Signature(s) Signed: 08/03/2022 8:44:03 PM By: Carlene Coria RN Entered By: Carlene Coria on 08/03/2022 12:52:40 [name] ([id number]) -------------------------------------------------------------------------------- Vitals Details Patient Name: [name] Date of Service: 08/03/2022 12:30 PM Medical Record Number: [id number] Patient Account Number: [id number] Date of Birth/Sex: 1966/05/13 (56 y.o. M) Treating RN: Carlene Coria Primary Care Meliana Canner: PATIENT, NO Other Clinician: Referring Kharisma Glasner: Fredirick Maudlin Treating Carmela Piechowski/Extender: Skipper Cliche in Treatment: 7 Vital Signs Time Taken: 12:43 Temperature (F): 98.3 Height (in): 70 Pulse (bpm): 66 Weight (lbs): 230 Respiratory Rate (breaths/min): 16 Body Mass Index (BMI): 33 Blood Pressure (mmHg): 124/84 Reference Range: 80 - 120 mg / dl Electronic Signature(s) Signed: 08/03/2022 8:44:03 PM By: Carlene Coria RN Entered By: Carlene Coria on 08/03/2022 12:43:43

## 2022-08-08 ENCOUNTER — Ambulatory Visit: Payer: 59 | Admitting: Vascular Surgery

## 2022-08-08 ENCOUNTER — Encounter: Payer: 59 | Attending: Physician Assistant | Admitting: Physician Assistant

## 2022-08-08 DIAGNOSIS — Z87891 Personal history of nicotine dependence: Secondary | ICD-10-CM | POA: Insufficient documentation

## 2022-08-08 DIAGNOSIS — M86672 Other chronic osteomyelitis, left ankle and foot: Secondary | ICD-10-CM | POA: Insufficient documentation

## 2022-08-08 DIAGNOSIS — L97524 Non-pressure chronic ulcer of other part of left foot with necrosis of bone: Secondary | ICD-10-CM | POA: Diagnosis not present

## 2022-08-08 DIAGNOSIS — E1169 Type 2 diabetes mellitus with other specified complication: Secondary | ICD-10-CM | POA: Diagnosis present

## 2022-08-08 DIAGNOSIS — E11621 Type 2 diabetes mellitus with foot ulcer: Secondary | ICD-10-CM | POA: Diagnosis not present

## 2022-08-08 LAB — GLUCOSE, CAPILLARY
Glucose-Capillary: 179 mg/dL — ABNORMAL HIGH (ref 70–99)
Glucose-Capillary: 140 mg/dL — ABNORMAL HIGH (ref 70–99)

## 2022-08-08 NOTE — Progress Notes (Signed)
ADIT, RIDDLES ([id number]) Visit Report for 08/08/2022 HBO Details Patient Name: [name], [name] Date of Service: 08/08/2022 10:00 AM Medical Record Number: [id number] Patient Account Number: [id number] Date of Birth/Sex: [birth year] (56 y.o. M) Treating RN: Huel Coventry Primary Care Quanell Loughney: PATIENT, NO Other Clinician: Izetta Dakin Referring Berenise Hunton: Duanne Guess Treating Vaneza Pickart/Extender: Rowan Blase in Treatment: 7 HBO Treatment Course Details Treatment Course Number: 1 Ordering Amaris Delafuente: Allen Derry Total Treatments Ordered: 40 HBO Treatment Start Date: 06/19/2022 HBO Indication: Diabetic Ulcer(s) of the Lower Extremity Standard/Conservative Wound Care tried and failed greater than or equal to 30 days Wound #1 Plantar Foot HBO Treatment Details Treatment Number: 9 Patient Type: Outpatient Chamber Type: Monoplace Chamber Serial #: [id number] Treatment Protocol: 2.5 ATA with 90 minutes oxygen, with two 5 minute air breaks Treatment Details Compression Rate Down: 1.5 psi / minute De-Compression Rate Up: 1.5 psi / minute Compress Tx Pressure Air breaks and breathing periods Decompress Decompress Begins Reached (leave unused spaces blank) Begins Ends Chamber Pressure (ATA) 1 2.5 2.5 2.5 2.5 2.5 - - 2.5 1 Clock Time (24 hr) 10:36 10:46 - - - - - - 12:07 12:17 Treatment Length: 101 (minutes) Treatment Segments: 3 Vital Signs Capillary Blood Glucose Reference Range: 80 - 120 mg / dl HBO Diabetic Blood Glucose Intervention Range: <131 mg/dl or >762 mg/dl Time Vitals Blood Respiratory Capillary Blood Glucose Pulse Action Type: Pulse: Temperature: Taken: Pressure: Rate: Glucose (mg/dl): Meter #: Oximetry (%) Taken: Pre 10:31 108/72 72 16 98 179 1 none per protocol Post 12:27 122/72 66 16 98.7 140 1 non per protocol Treatment Response Treatment Toleration: Well Adverse Events: 1:Barotrauma - Ear Treatment Completion Treatment Aborted/Not Restarted Status: Reason: Adverse  Event Treatment Notes Patient asked to be removed from the chamber about 10 minutes early due to ear pain (he as PE tubes). PA was notified and checked his ears after leaving the chamber. Additional Procedure Documentation Tissue Sevierity: Necrosis of bone Emylie Amster Notes Patient with 10 minutes left in his dive today stated that he was having ear pain on the left side. Subsequently Kennon Rounds did bring him out early in order to make sure nothing was wrong. Once he got out I did have a look in his ears and everything appeared to be fine both tubes were intact and in place and there did not appear to be any signs of drainage, redness, or fluid buildup behind the ear that would indicate that the tube was Jefferson, Shamond ([id number]) stopped up. The patient tells me "maybe I was just a little scared due to what is happened in the past". Nonetheless I think he is fine to continue with HBO therapy and everything otherwise looks to be doing very well today. Post Treatment Teed Score Post Treatment Teed Score: Left Ear Grade 0 Post Treatment Teed Score: Right Ear Grade 0 HBO Attestation I certify that I supervised this HBO treatment in accordance with Medicare guidelines. A trained emergency response team is readily Yes available per hospital policies and procedures. Continue HBOT as ordered. Yes Electronic Signature(s) Signed: 08/08/2022 4:22:01 PM By: Lenda Kelp PA-C Entered By: Lenda Kelp on 08/08/2022 16:22:00 [name] ([id number]) -------------------------------------------------------------------------------- HBO Safety Checklist Details Patient Name: [name] Date of Service: 08/08/2022 10:00 AM Medical Record Number: [id number] Patient Account Number: [id number] Date of Birth/Sex: December 10, 1965 (56 y.o. M) Treating RN: Huel Coventry Primary Care Caroline Matters: PATIENT, NO Other Clinician: Izetta Dakin Referring Alexandru Moorer: Duanne Guess Treating Hawken Bielby/Extender: Rowan Blase in  Treatment: 7 HBO Safety Checklist Items  Safety Checklist Consent Form Signed Patient voided / foley secured and emptied When did you last eato 07:00 am Last dose of injectable or oral agent 08/07/22 pm Ostomy pouch emptied and vented if applicable NA All implantable devices assessed, documented and approved NA Intravenous access site secured and place NA Valuables secured Linens and cotton and cotton/polyester blend (less than 51% polyester) Personal oil-based products / skin lotions / body lotions removed Wigs or hairpieces removed NA Smoking or tobacco materials removed NA Books / newspapers / magazines / loose paper removed NA Cologne, aftershave, perfume and deodorant removed Jewelry removed (may wrap wedding band) Make-up removed NA Hair care products removed Battery operated devices (external) removed NA Heating patches and chemical warmers removed NA Titanium eyewear removed NA Nail polish cured greater than 10 hours NA Casting material cured greater than 10 hours NA Hearing aids removed NA Loose dentures or partials removed NA Prosthetics have been removed NA Patient demonstrates correct use of air break device (if applicable) Patient concerns have been addressed Patient grounding bracelet on and cord attached to chamber Specifics for Inpatients (complete in addition to above) Medication sheet sent with patient Intravenous medications needed or due during therapy sent with patient Drainage tubes (e.g. nasogastric tube or chest tube secured and vented) Endotracheal or Tracheotomy tube secured Cuff deflated of air and inflated with saline Airway suctioned Electronic Signature(s) Signed: 08/08/2022 4:53:20 PM By: Aleda Grana RCP, RRT, CHT Entered By: Aleda Grana on 08/08/2022 11:21:42

## 2022-08-08 NOTE — Progress Notes (Signed)
MILON, DETHLOFF ([id number]) Visit Report for 08/08/2022 Arrival Information Details Patient Name: [name], [name] Date of Service: 08/08/2022 10:00 AM Medical Record Number: [id number] Patient Account Number: [id number] Date of Birth/Sex: [birth year] (56 y.o. M) Treating RN: Huel Coventry Primary Care Orion Vandervort: PATIENT, NO Other Clinician: Izetta Dakin Referring Christpoher Sievers: Duanne Guess Treating Riaan Toledo/Extender: Rowan Blase in Treatment: 7 Visit Information History Since Last Visit Added or deleted any medications: No Patient Arrived: Knee Scooter Any new allergies or adverse reactions: No Arrival Time: 10:10 Had a fall or experienced change in No Accompanied By: self activities of daily living that may affect Transfer Assistance: None risk of falls: Patient Identification Verified: Yes Signs or symptoms of abuse/neglect since last visito No Secondary Verification Process Completed: Yes Hospitalized since last visit: No Patient Requires Transmission-Based Precautions: No Implantable device outside of the clinic excluding No Patient Has Alerts: No cellular tissue based products placed in the center since last visit: Has Dressing in Place as Prescribed: Yes Pain Present Now: No Electronic Signature(s) Signed: 08/08/2022 4:53:20 PM By: Aleda Grana RCP, RRT, CHT Entered By: Aleda Grana on 08/08/2022 11:20:21 [name] ([id number]) -------------------------------------------------------------------------------- Encounter Discharge Information Details Patient Name: [name] Date of Service: 08/08/2022 10:00 AM Medical Record Number: [id number] Patient Account Number: [id number] Date of Birth/Sex: Nov 22, 1966 (56 y.o. M) Treating RN: Huel Coventry Primary Care Laketa Sandoz: PATIENT, NO Other Clinician: Izetta Dakin Referring Arbie Blankley: Duanne Guess Treating Ewald Beg/Extender: Rowan Blase in Treatment: 7 Encounter Discharge Information Items Discharge  Condition: Stable Ambulatory Status: Knee Scooter Discharge Destination: Home Transportation: Private Auto Accompanied By: self Schedule Follow-up Appointment: Yes Clinical Summary of Care: Notes Patient has an HBO treatment scheduled on 08/09/22 at 10:00 am Electronic Signature(s) Signed: 08/08/2022 4:53:20 PM By: Aleda Grana RCP, RRT, CHT Entered By: Aleda Grana on 08/08/2022 13:55:21 [name] ([id number]) -------------------------------------------------------------------------------- Vitals Details Patient Name: [name] Date of Service: 08/08/2022 10:00 AM Medical Record Number: [id number] Patient Account Number: [id number] Date of Birth/Sex: 04/22/1966 (55 y.o. M) Treating RN: Huel Coventry Primary Care Maricruz Lucero: PATIENT, NO Other Clinician: Izetta Dakin Referring Lamere Lightner: Duanne Guess Treating Elysha Daw/Extender: Rowan Blase in Treatment: 7 Vital Signs Time Taken: 10:31 Temperature (F): 98.0 Height (in): 70 Pulse (bpm): 72 Weight (lbs): 230 Respiratory Rate (breaths/min): 16 Body Mass Index (BMI): 33 Blood Pressure (mmHg): 108/72 Capillary Blood Glucose (mg/dl): 628 Reference Range: 80 - 120 mg / dl Electronic Signature(s) Signed: 08/08/2022 4:53:20 PM By: Aleda Grana RCP, RRT, CHT Entered By: Aleda Grana on 08/08/2022 11:20:04

## 2022-08-08 NOTE — Progress Notes (Signed)
JASMIN, TRUMBULL ([id number]) Visit Report for 08/08/2022 Problem List Details Patient Name: [name], [name] Date of Service: 08/08/2022 10:00 AM Medical Record Number: [id number] Patient Account Number: [id number] Date of Birth/Sex: 10-09-1966 (56 y.o. 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 M) Treating RN: Huel Coventry Primary Care Provider: PATIENT, NO Other Clinician: Izetta Dakin Referring Provider: Duanne Guess Treating Provider/Extender: Rowan Blase in Treatment: 7 Diagnosis Coding ICD-10 Codes Code Description [phone] Other chronic osteomyelitis, left ankle and foot E11.621 Type 2 diabetes mellitus with foot ulcer L97.524 Non-pressure chronic ulcer of other part of left foot with necrosis of bone Facility Procedures CPT4 Code: 86767209 Description: (Facility Use Only) HBOT, full body chamber, Modifier: Quantity: 3 Physician Procedures CPT4 Code: 4709628 Description: 36629 - WC PHYS HYPERBARIC OXYGEN THERAPY Modifier: Quantity: 1 CPT4 Code: Description:  ICD-10 Diagnosis Description M86.672 Other chronic osteomyelitis, left ankle and foot L97.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

## 2022-08-09 ENCOUNTER — Encounter (HOSPITAL_BASED_OUTPATIENT_CLINIC_OR_DEPARTMENT_OTHER): Payer: 59 | Admitting: Internal Medicine

## 2022-08-09 DIAGNOSIS — M86672 Other chronic osteomyelitis, left ankle and foot: Secondary | ICD-10-CM

## 2022-08-09 DIAGNOSIS — L97524 Non-pressure chronic ulcer of other part of left foot with necrosis of bone: Secondary | ICD-10-CM

## 2022-08-09 DIAGNOSIS — E1169 Type 2 diabetes mellitus with other specified complication: Secondary | ICD-10-CM | POA: Diagnosis not present

## 2022-08-09 LAB — GLUCOSE, CAPILLARY
Glucose-Capillary: 221 mg/dL — ABNORMAL HIGH (ref 70–99)
Glucose-Capillary: 242 mg/dL — ABNORMAL HIGH (ref 70–99)

## 2022-08-09 NOTE — Progress Notes (Signed)
LAMORRIS, KNOBLOCK ([id number]) Visit Report for 08/09/2022 HBO Details Patient Name: HAYDN, HUTSELL Date of Service: 08/09/2022 10:00 AM Medical Record Number: [id number] Patient Account Number: [id number] Date of Birth/Sex: 1966-10-27 (56 y.o. M) Treating RN: Huel Coventry Primary Care Kaeden Depaz: PATIENT, NO Other Clinician: Izetta Dakin Referring Analys Ryden: Duanne Guess Treating Alma Muegge/Extender: Tilda Franco in Treatment: 7 HBO Treatment Course Details Treatment Course Number: 1 Ordering Maile Linford: Allen Derry Total Treatments Ordered: 40 HBO Treatment Start Date: 06/19/2022 HBO Indication: Diabetic Ulcer(s) of the Lower Extremity Standard/Conservative Wound Care tried and failed greater than or equal to 30 days Wound #1 Plantar Foot HBO Treatment Details Treatment Number: 10 Patient Type: Outpatient Chamber Type: Monoplace Chamber Serial #: [id number] Treatment Protocol: 2.5 ATA with 90 minutes oxygen, with two 5 minute air breaks Treatment Details Compression Rate Down: 1.5 psi / minute De-Compression Rate Up: 1.5 psi / minute Compress Tx Pressure Air breaks and breathing periods Decompress Decompress Begins Reached (leave unused spaces blank) Begins Ends Chamber Pressure (ATA) 1 2.5 2.5 2.5 2.5 2.5 - - 2.5 1 Clock Time (24 hr) 10:43 10:54 - - - - - - 12:25 12:34 Treatment Length: 111 (minutes) Treatment Segments: 4 Vital Signs Capillary Blood Glucose Reference Range: 80 - 120 mg / dl HBO Diabetic Blood Glucose Intervention Range: <131 mg/dl or >540 mg/dl Time Vitals Blood Respiratory Capillary Blood Glucose Pulse Action Type: Pulse: Temperature: Taken: Pressure: Rate: Glucose (mg/dl): Meter #: Oximetry (%) Taken: Pre 10:32 120/66 72 16 98 221 1 non per protocol Post 12:39 122/80 60 16 98.1 242 1 none per protocol Treatment Response Treatment Toleration: Well Treatment Completion Treatment Completed without Adverse Event Status: HBO Attestation I certify that I  supervised this HBO treatment in accordance with Medicare guidelines. A trained emergency response team is readily Yes available per hospital policies and procedures. Continue HBOT as ordered. Yes Electronic Signature(s) Signed: 08/09/2022 3:27:51 PM By: Geralyn Corwin DO Previous Signature: 08/09/2022 1:23:17 PM Version By: Aleda Grana RCP, RRT, CHT Entered By: Geralyn Corwin on 08/09/2022 13:57:02 [name] ([id number]) -------------------------------------------------------------------------------- HBO Safety Checklist Details Patient Name: [name] Date of Service: 08/09/2022 10:00 AM Medical Record Number: [id number] Patient Account Number: [id number] Date of Birth/Sex: 07/30/1966 (56 y.o. M) Treating RN: Huel Coventry Primary Care Sadonna Kotara: PATIENT, NO Other Clinician: Izetta Dakin Referring Dontravious Camille: Duanne Guess Treating Ulysee Fyock/Extender: Tilda Franco in Treatment: 7 HBO Safety Checklist Items Safety Checklist Consent Form Signed Patient voided / foley secured and emptied When did you last eato 07:00 am Last dose of injectable or oral agent 08/08/22 pm Ostomy pouch emptied and vented if applicable NA All implantable devices assessed, documented and approved NA Intravenous access site secured and place NA Valuables secured Linens and cotton and cotton/polyester blend (less than 51% polyester) Personal oil-based products / skin lotions / body lotions removed Wigs or hairpieces removed NA Smoking or tobacco materials removed NA Books / newspapers / magazines / loose paper removed NA Cologne, aftershave, perfume and deodorant removed Jewelry removed (may wrap wedding band) Make-up removed NA Hair care products removed Battery operated devices (external) removed NA Heating patches and chemical warmers removed NA Titanium eyewear removed NA Nail polish cured greater than 10 hours NA Casting material cured greater than 10  hours NA Hearing aids removed NA Loose dentures or partials removed NA Prosthetics have been removed NA Patient demonstrates correct use of air break device (if applicable) Patient concerns have been addressed Patient grounding bracelet on and cord attached to chamber Specifics for Inpatients (  complete in addition to above) Medication sheet sent with patient Intravenous medications needed or due during therapy sent with patient Drainage tubes (e.g. nasogastric tube or chest tube secured and vented) Endotracheal or Tracheotomy tube secured Cuff deflated of air and inflated with saline Airway suctioned Electronic Signature(s) Signed: 08/09/2022 1:23:17 PM By: Aleda Grana RCP, RRT, CHT Entered By: Aleda Grana on 08/09/2022 11:20:52

## 2022-08-09 NOTE — Progress Notes (Signed)
[name], [name] ([id number]) Visit Report for 08/09/2022 Arrival Information Details Patient Name: [name], [name] Date of Service: 08/09/2022 10:00 AM Medical Record Number: [id number] Patient Account Number: [id number] Date of Birth/Sex: 09-Aug-1966 (56 y.o. M) Treating RN: Huel Coventry Primary Care Jeneen Doutt: PATIENT, NO Other Clinician: Izetta Dakin Referring Lakeisa Heninger: Duanne Guess Treating Pascha Fogal/Extender: Tilda Franco in Treatment: 7 Visit Information History Since Last Visit Added or deleted any medications: No Patient Arrived: Knee Scooter Any new allergies or adverse reactions: No Arrival Time: 10:15 Had a fall or experienced change in No Accompanied By: self activities of daily living that may affect Transfer Assistance: None risk of falls: Patient Identification Verified: Yes Signs or symptoms of abuse/neglect since last visito No Secondary Verification Process Completed: Yes Hospitalized since last visit: No Patient Requires Transmission-Based Precautions: No Implantable device outside of the clinic excluding No Patient Has Alerts: No cellular tissue based products placed in the center since last visit: Has Dressing in Place as Prescribed: Yes Pain Present Now: No Electronic Signature(s) Signed: 08/09/2022 1:23:17 PM By: Aleda Grana RCP, RRT, CHT Entered By: Aleda Grana on 08/09/2022 11:16:53 [name] ([id number]) -------------------------------------------------------------------------------- Encounter Discharge Information Details Patient Name: [name] Date of Service: 08/09/2022 10:00 AM Medical Record Number: [id number] Patient Account Number: [id number] Date of Birth/Sex: 10-20-1966 (56 y.o. M) Treating RN: Huel Coventry Primary Care Kalista Laguardia: PATIENT, NO Other Clinician: Izetta Dakin Referring Gian Ybarra: Duanne Guess Treating Emila Steinhauser/Extender: Tilda Franco in Treatment: 7 Encounter Discharge Information  Items Discharge Condition: Stable Ambulatory Status: Knee Scooter Discharge Destination: Home Transportation: Private Auto Accompanied By: self Schedule Follow-up Appointment: Yes Clinical Summary of Care: Notes Patient has an HBO treatment scheduled at 10:00 am on 08/10/22. Electronic Signature(s) Signed: 08/09/2022 1:23:17 PM By: Aleda Grana RCP, RRT, CHT Entered By: Aleda Grana on 08/09/2022 13:17:22 [name] ([id number]) -------------------------------------------------------------------------------- Vitals Details Patient Name: [name] Date of Service: 08/09/2022 10:00 AM Medical Record Number: [id number] Patient Account Number: [id number] Date of Birth/Sex: Oct 15, 1966 (56 y.o. M) Treating RN: Huel Coventry Primary Care Shanicqua Coldren: PATIENT, NO Other Clinician: Izetta Dakin Referring Jannine Abreu: Duanne Guess Treating Val Farnam/Extender: Tilda Franco in Treatment: 7 Vital Signs Time Taken: 10:32 Temperature (F): 98.0 Height (in): 70 Pulse (bpm): 72 Weight (lbs): 230 Respiratory Rate (breaths/min): 16 Body Mass Index (BMI): 33 Blood Pressure (mmHg): 120/66 Capillary Blood Glucose (mg/dl): 381 Reference Range: 80 - 120 mg / dl Electronic Signature(s) Signed: 08/09/2022 1:23:17 PM By: Aleda Grana RCP, RRT, CHT Entered By: Aleda Grana on 08/09/2022 11:19:34

## 2022-08-10 ENCOUNTER — Encounter: Payer: 59 | Admitting: Physician Assistant

## 2022-08-10 ENCOUNTER — Ambulatory Visit: Payer: 59 | Admitting: Physician Assistant

## 2022-08-11 ENCOUNTER — Ambulatory Visit: Payer: 59 | Admitting: Physician Assistant

## 2022-08-11 ENCOUNTER — Encounter: Payer: 59 | Admitting: Physician Assistant

## 2022-08-11 DIAGNOSIS — E11621 Type 2 diabetes mellitus with foot ulcer: Secondary | ICD-10-CM | POA: Diagnosis not present

## 2022-08-14 ENCOUNTER — Encounter: Payer: 59 | Admitting: Physician Assistant

## 2022-08-15 ENCOUNTER — Encounter: Payer: 59 | Admitting: Physician Assistant

## 2022-08-16 ENCOUNTER — Encounter: Payer: 59 | Admitting: Internal Medicine

## 2022-08-16 ENCOUNTER — Other Ambulatory Visit: Payer: Self-pay | Admitting: Infectious Diseases

## 2022-08-16 ENCOUNTER — Other Ambulatory Visit: Payer: Self-pay

## 2022-08-16 ENCOUNTER — Telehealth: Payer: Self-pay

## 2022-08-16 MED ORDER — METRONIDAZOLE 500 MG PO TABS
500.0000 mg | ORAL_TABLET | Freq: Two times a day (BID) | ORAL | 1 refills | Status: DC
  Filled 2022-08-16: qty 60, 30d supply, fill #0

## 2022-08-16 NOTE — Telephone Encounter (Signed)
Patient called office today to confirm antibiotic regimen. States that he has only been taking two of the three antibiotics prescribed. Per last note patient is supposed to be on Flagyl, Levaquin, and cipro.  Spoke with provider and confirmed he is supposed to be taking all three antibiotics. Is missing flagyl. Provider will send in new prescription to pharmacy today.  Advised he call pharmacy today and have medication filled. Will call office back if needed. [name], RMA

## 2022-08-16 NOTE — Progress Notes (Signed)
Sent refill on metronidazole to [facility]

## 2022-08-17 ENCOUNTER — Encounter: Payer: 59 | Admitting: Physician Assistant

## 2022-08-17 DIAGNOSIS — L97522 Non-pressure chronic ulcer of other part of left foot with fat layer exposed: Secondary | ICD-10-CM | POA: Diagnosis not present

## 2022-08-17 DIAGNOSIS — E1169 Type 2 diabetes mellitus with other specified complication: Secondary | ICD-10-CM | POA: Diagnosis not present

## 2022-08-17 DIAGNOSIS — E11621 Type 2 diabetes mellitus with foot ulcer: Secondary | ICD-10-CM | POA: Diagnosis not present

## 2022-08-17 NOTE — Progress Notes (Signed)
BENGIE, KAUCHER ([id number]) Visit Report for 08/17/2022 Chief Complaint Document Details Patient Name: [name], [name] Date of Service: 08/17/2022 12:30 PM Medical Record Number: [id number] Patient Account Number: [id number] Date of Birth/Sex: 25-Nov-1966 (56 y.o. 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 M) Treating RN: Yevonne Pax Primary Care Provider: PATIENT, NO Other Clinician: Referring Provider: Duanne Guess Treating Provider/Extender: Rowan Blase in Treatment: 9 Debridement Performed for Wound #1 Left,Plantar Foot Assessment: Performed By: Physician Nelida Meuse., PA-C Debridement Type: Debridement Severity of Tissue Pre Debridement: Fat layer exposed Level of Consciousness (Pre- Awake and Alert procedure): Pre-procedure Verification/Time Out Yes - 12:56 Taken: Start Time: 12:56 Total Area Debrided (L x W): 11 (cm) x 7.5 (cm) = 82.5 (cm) Tissue and other material Viable, Non-Viable, Slough, Subcutaneous, Skin: Dermis , Skin: Epidermis, Slough debrided: Level: Skin/Subcutaneous Tissue Debridement Description: Excisional Instrument: Curette Bleeding: Minimum Hemostasis Achieved: Pressure End Time: 13:01 Procedural Pain: 0 Post Procedural Pain: 0 Response to Treatment: Procedure was  tolerated well Level of Consciousness (Post- Awake and Alert procedure): Post Debridement Measurements of Total Wound Length: (cm) 11 Width: (cm) 7.5 Depth: (cm) 0.2 Volume: (cm) 12.959 Character of Wound/Ulcer Post Debridement: Improved Severity of Tissue Post Debridement: Fat layer exposed Post Procedure Diagnosis Same as Pre-procedure Electronic Signature(s) Unsigned Entered ByYevonne Pax on 08/17/2022 12:58:43 Signature(s): Date(s): [name] ([id number]) -------------------------------------------------------------------------------- Physician Orders Details Patient Name: [name] Date of Service: 08/17/2022 12:30 PM Medical Record Number: [id number] Patient Account Number: [id number] Date of Birth/Sex: Jun 19, 1966 (56 y.o. M) Treating RN: Yevonne Pax Primary Care Provider: PATIENT, NO Other Clinician: Referring Provider: Duanne Guess Treating Provider/Extender: Rowan Blase in Treatment: 9 Verbal / Phone Orders: No Diagnosis Coding Follow-up Appointments o Return Appointment in 1 week. Bathing/ Shower/ Hygiene o May shower; gently cleanse wound with antibacterial soap, rinse and pat dry prior to dressing wounds Anesthetic (Use 'Patient Medications' Section for Anesthetic Order Entry) o Lidocaine applied to wound bed Edema Control - Lymphedema / Segmental Compressive Device / Other o Elevate, Exercise Daily and Avoid Standing for Long Periods of Time. o Elevate legs to the level of the heart and pump ankles as often as possible o Elevate leg(s) parallel to the floor when sitting. Wound Treatment Wound #1 - Foot Wound Laterality: Plantar, Left Cleanser: Dakin 16 (oz) 0.25 1 x Per Day/30 Days Discharge Instructions: Use as directed. Primary Dressing: Gauze (Generic) 1 x Per Day/30 Days Discharge Instructions: moistened with 1/4 Dakins Solution Secondary Dressing: Kerlix 4.5 x 4.1 (in/yd) (Generic) 1 x Per Day/30 Days Discharge  Instructions: Apply Kerlix 4.5 x 4.1 (in/yd) as instructed Secured With: Medipore Tape - 55M Medipore H Soft Cloth Surgical Tape, 2x2 (in/yd) (Generic) 1 x Per Day/30 Days Electronic Signature(s) Unsigned Entered ByYevonne Pax on 08/17/2022 12:57:36 Signature(s): Date(s): [name] ([id number]) -------------------------------------------------------------------------------- Problem List Details Patient Name: [name], [name] Date of Service: 08/17/2022 12:30 PM Medical Record Number: [id number] Patient Account Number: [id number] Date of Birth/Sex: 1966-08-17 (56 y.o. 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

## 2022-08-18 ENCOUNTER — Encounter: Payer: 59 | Admitting: Physician Assistant

## 2022-08-18 ENCOUNTER — Ambulatory Visit: Payer: Self-pay | Admitting: *Deleted

## 2022-08-18 NOTE — Telephone Encounter (Signed)
Summary: Tingling in extremities   Patient has been tingling with tingling in both hands and feet for about a month now. His hands have also been really cold as well. He has a follow up scheduled in October but is a bit nervous being diabetic. Please assist patient further        Chief Complaint: tingling hands and feet Symptoms: tingling with numbness at times to hands and feet. Comes and goes but getting worse. Hands and feet staying cold and requires to wear gloves inside. Hx diabetic reports sugars 120-180's.  Frequency: 1 month Pertinent Negatives: Patient denies weakness on either side of body no blurred vision  Disposition: [] ED /[x] Urgent Care (no appt availability in office) / [] Appointment(In office/virtual)/ []  [facility]/ [] Home Care/ [] Refused Recommended Disposition /[] [facility]/ [x]  Follow-up with PCP Additional Notes:   Appt already scheduled for 09/11/22. Recommended UC due to no appt with in 3 days. Please advise .     Reason for Disposition  [1] Numbness or tingling in one or both feet AND [2] is a chronic symptom (recurrent or ongoing AND present > 4 weeks)  Answer Assessment - Initial Assessment Questions 1. SYMPTOM: "What is the main symptom you are concerned about?" (e.g., weakness, numbness)     Tingling with numbness at times to hands and feet. Hands and feet cold requires to wear gloves inside  2. ONSET: "When did this start?" (minutes, hours, days; while sleeping)     1 months ago  3. LAST NORMAL: "When was the last time you (the patient) were normal (no symptoms)?"     na 4. PATTERN "Does this come and go, or has it been constant since it started?"  "Is it present now?"     Comes and goes but more often now  5. CARDIAC SYMPTOMS: "Have you had any of the following symptoms: chest pain, difficulty breathing, palpitations?"     no 6. NEUROLOGIC SYMPTOMS: "Have you had any of the following symptoms: headache, dizziness, vision loss,  double vision, changes in speech, unsteady on your feet?"     Tingling in hands and feet hx diabetic 7. OTHER SYMPTOMS: "Do you have any other symptoms?"     Blood glucose 120-180's 8. PREGNANCY: "Is there any chance you are pregnant?" "When was your last menstrual period?"     na  Protocols used: Neurologic Deficit-A-AH

## 2022-08-18 NOTE — Progress Notes (Signed)
Matthew, [name] ([id number]) Visit Report for 08/17/2022 Arrival Information Details Patient Name: [name], [name] Date of Service: 08/17/2022 12:30 PM Medical Record Number: [id number] Patient Account Number: [id number] Date of Birth/Sex: 17-Jul-1966 (56 y.o. M) Treating RN: Carlene Coria Primary Care Edom Schmuhl: PATIENT, NO Other Clinician: Referring Aletheia Tangredi: Fredirick Maudlin Treating Amarya Kuehl/Extender: Skipper Cliche in Treatment: 9 Visit Information History Since Last Visit All ordered tests and consults were completed: No Patient Arrived: Knee Scooter Added or deleted any medications: No Arrival Time: 12:42 Any new allergies or adverse reactions: No Accompanied By: self Had a fall or experienced change in No Transfer Assistance: None activities of daily living that may affect Patient Identification Verified: Yes risk of falls: Secondary Verification Process Completed: Yes Signs or symptoms of abuse/neglect since last visito No Patient Requires Transmission-Based Precautions: No Hospitalized since last visit: No Patient Has Alerts: No Implantable device outside of the clinic excluding No cellular tissue based products placed in the center since last visit: Has Dressing in Place as Prescribed: Yes Pain Present Now: No Electronic Signature(s) Signed: 08/18/2022 12:07:54 PM By: Carlene Coria RN Entered By: Carlene Coria on 08/17/2022 12:44:56 [name] ([id number]) -------------------------------------------------------------------------------- Clinic Level of Care Assessment Details Patient Name: Matthew, [name] Date of Service: 08/17/2022 12:30 PM Medical Record Number: [id number] Patient Account Number: [id number] Date of Birth/Sex: 09/04/1966 (56 y.o. M) Treating RN: Carlene Coria Primary Care Damya Comley: PATIENT, NO Other Clinician: Referring Khali Albanese: Fredirick Maudlin Treating Malaisha Silliman/Extender: Skipper Cliche in Treatment: 9 Clinic Level of Care Assessment Items TOOL 1 Quantity  Score []  - Use when EandM and Procedure is performed on INITIAL visit 0 ASSESSMENTS - Nursing Assessment / Reassessment []  - General Physical Exam (combine w/ comprehensive assessment (listed just below) when performed on new 0 pt. evals) []  - 0 Comprehensive Assessment (HX, ROS, Risk Assessments, Wounds Hx, etc.) ASSESSMENTS - Wound and Skin Assessment / Reassessment []  - Dermatologic / Skin Assessment (not related to wound area) 0 ASSESSMENTS - Ostomy and/or Continence Assessment and Care []  - Incontinence Assessment and Management 0 []  - 0 Ostomy Care Assessment and Management (repouching, etc.) PROCESS - Coordination of Care []  - Simple Patient / Family Education for ongoing care 0 []  - 0 Complex (extensive) Patient / Family Education for ongoing care []  - 0 Staff obtains Programmer, systems, Records, Test Results / Process Orders []  - 0 Staff telephones HHA, Nursing Homes / Clarify orders / etc []  - 0 Routine Transfer to another Facility (non-emergent condition) []  - 0 Routine Hospital Admission (non-emergent condition) []  - 0 New Admissions / Biomedical engineer / Ordering NPWT, Apligraf, etc. []  - 0 Emergency Hospital Admission (emergent condition) PROCESS - Special Needs []  - Pediatric / Minor Patient Management 0 []  - 0 Isolation Patient Management []  - 0 Hearing / Language / Visual special needs []  - 0 Assessment of Community assistance (transportation, D/C planning, etc.) []  - 0 Additional assistance / Altered mentation []  - 0 Support Surface(s) Assessment (bed, cushion, seat, etc.) INTERVENTIONS - Miscellaneous []  - External ear exam 0 []  - 0 Patient Transfer (multiple staff / Civil Service fast streamer / Similar devices) []  - 0 Simple Staple / Suture removal (25 or less) []  - 0 Complex Staple / Suture removal (26 or more) []  - 0 Hypo/Hyperglycemic Management (do not check if billed separately) []  - 0 Ankle / Brachial Index (ABI) - do not check if billed separately Has the  patient been seen at the hospital within the last three years: Yes Total Score: 0 Level Of Care: ____  JADD, GASIOR ([id number]) Electronic Signature(s) Signed: 08/18/2022 12:07:54 PM By: Carlene Coria RN Entered By: Carlene Coria on 08/17/2022 12:58:51 NIKKO, GOLDWIRE ([id number]) -------------------------------------------------------------------------------- Encounter Discharge Information Details Patient Name: [name] Date of Service: 08/17/2022 12:30 PM Medical Record Number: [id number] Patient Account Number: [id number] Date of Birth/Sex: 02-21-1966 (56 y.o. M) Treating RN: Carlene Coria Primary Care Ridhi Hoffert: PATIENT, NO Other Clinician: Referring Mackinze Criado: Fredirick Maudlin Treating Jaxsyn Azam/Extender: Skipper Cliche in Treatment: 9 Encounter Discharge Information Items Post Procedure Vitals Discharge Condition: Stable Temperature (F): 98.3 Ambulatory Status: Knee Scooter Pulse (bpm): 86 Discharge Destination: Home Respiratory Rate (breaths/min): 16 Transportation: Private Auto Blood Pressure (mmHg): 162/82 Accompanied By: self Schedule Follow-up Appointment: Yes Clinical Summary of Care: Electronic Signature(s) Signed: 08/18/2022 12:07:54 PM By: Carlene Coria RN Entered By: Carlene Coria on 08/17/2022 12:59:51 [name] ([id number]) -------------------------------------------------------------------------------- Lower Extremity Assessment Details Patient Name: [name] Date of Service: 08/17/2022 12:30 PM Medical Record Number: [id number] Patient Account Number: [id number] Date of Birth/Sex: 21-Dec-1965 (56 y.o. M) Treating RN: Carlene Coria Primary Care Aldridge Krzyzanowski: PATIENT, NO Other Clinician: Referring Daisja Kessinger: Fredirick Maudlin Treating Ryleeann Urquiza/Extender: Skipper Cliche in Treatment: 9 Edema Assessment Assessed: [Left: No] [Right: Yes] Edema: [Left: Ye] [Right: s] Calf Left: Right: Point of Measurement: 32 cm From Medial Instep 39.5 cm Ankle Left: Right: Point of  Measurement: 11 cm From Medial Instep 24.5 cm Vascular Assessment Pulses: Dorsalis Pedis Palpable: [Left:Yes] Electronic Signature(s) Signed: 08/18/2022 12:07:54 PM By: Carlene Coria RN Entered By: Carlene Coria on 08/17/2022 12:51:07 [name] ([id number]) -------------------------------------------------------------------------------- Multi Wound Chart Details Patient Name: [name] Date of Service: 08/17/2022 12:30 PM Medical Record Number: [id number] Patient Account Number: [id number] Date of Birth/Sex: 24-May-1966 (56 y.o. M) Treating RN: Carlene Coria Primary Care Rain Friedt: PATIENT, NO Other Clinician: Referring Serah Nicoletti: Fredirick Maudlin Treating Laurajean Hosek/Extender: Skipper Cliche in Treatment: 9 Vital Signs Height(in): 70 Pulse(bpm): 86 Weight(lbs): 230 Blood Pressure(mmHg): 162/82 Body Mass Index(BMI): 33 Temperature(F): 98.3 Respiratory Rate(breaths/min): 16 Photos: [N/A:N/A] Wound Location: Left, Plantar Foot N/A N/A Wounding Event: Gradually Appeared N/A N/A Primary Etiology: Diabetic Wound/Ulcer of the Lower N/A N/A Extremity Comorbid History: Type II Diabetes N/A N/A Date Acquired: 12/04/2021 N/A N/A Weeks of Treatment: 9 N/A N/A Wound Status: Open N/A N/A Wound Recurrence: No N/A N/A Pending Amputation on Yes N/A N/A Presentation: Measurements L x W x D (cm) 11x7.5x0.2 N/A N/A Area (cm) : 64.795 N/A N/A Volume (cm) : 12.959 N/A N/A % Reduction in Area: 35.50% N/A N/A % Reduction in Volume: 74.20% N/A N/A Classification: Grade 4 N/A N/A Exudate Amount: Medium N/A N/A Exudate Type: Serosanguineous N/A N/A Exudate Color: red, brown N/A N/A Granulation Amount: Large (67-100%) N/A N/A Granulation Quality: Pink, Pale N/A N/A Necrotic Amount: Small (1-33%) N/A N/A Exposed Structures: Fat Layer (Subcutaneous Tissue): N/A N/A Yes Fascia: No Tendon: No Muscle: No Joint: No Bone: No Epithelialization: None N/A N/A Treatment Notes Electronic  Signature(s) Signed: 08/18/2022 12:07:54 PM By: Carlene Coria RN Entered By: Carlene Coria on 08/17/2022 12:51:21 [name] ([id number]) -------------------------------------------------------------------------------- Multi-Disciplinary Care Plan Details Patient Name: [name] Date of Service: 08/17/2022 12:30 PM Medical Record Number: [id number] Patient Account Number: [id number] Date of Birth/Sex: 23-Jun-1966 (56 y.o. M) Treating RN: Carlene Coria Primary Care Renelle Stegenga: PATIENT, NO Other Clinician: Referring Aeryn Medici: Fredirick Maudlin Treating Mylani Gentry/Extender: Skipper Cliche in Treatment: 9 Active Inactive Osteomyelitis Nursing Diagnoses: Infection: osteomyelitis Knowledge deficit related to disease process and management Potential for infection: osteomyelitis Goals: Diagnostic evaluation for osteomyelitis completed as ordered Date Initiated: 06/28/2022 Date Inactivated: 08/03/2022  Target Resolution Date: 06/28/2022 Goal Status: Met Patient/caregiver will verbalize understanding of disease process and disease management Date Initiated: 06/28/2022 Target Resolution Date: 06/28/2022 Goal Status: Active Patient's osteomyelitis will resolve Date Initiated: 06/28/2022 Target Resolution Date: 06/28/2022 Goal Status: Active Signs and symptoms for osteomyelitis will be recognized and promptly addressed Date Initiated: 06/28/2022 Date Inactivated: 08/03/2022 Target Resolution Date: 06/28/2022 Goal Status: Met Interventions: Assess for signs and symptoms of osteomyelitis resolution every visit Provide education on osteomyelitis Screen for HBO Notes: Wound/Skin Impairment Nursing Diagnoses: Knowledge deficit related to ulceration/compromised skin integrity Goals: Patient/caregiver will verbalize understanding of skin care regimen Date Initiated: 06/15/2022 Date Inactivated: 08/03/2022 Target Resolution Date: 07/16/2022 Goal Status: Met Ulcer/skin breakdown will have a volume  reduction of 30% by week 4 Date Initiated: 06/15/2022 Date Inactivated: 08/03/2022 Target Resolution Date: 07/16/2022 Goal Status: Unmet Unmet Reason: comorbities Ulcer/skin breakdown will have a volume reduction of 50% by week 8 Date Initiated: 06/15/2022 Target Resolution Date: 08/16/2022 Goal Status: Active Ulcer/skin breakdown will have a volume reduction of 80% by week 12 Date Initiated: 06/15/2022 Target Resolution Date: 09/15/2022 Goal Status: Active Ulcer/skin breakdown will heal within 14 weeks Date Initiated: 06/15/2022 Target Resolution Date: 10/16/2022 Goal Status: Active Interventions: Assess patient/caregiver ability to obtain necessary supplies Assess patient/caregiver ability to perform ulcer/skin care regimen upon admission and as needed OLLIE, ESTY ([id number]) Assess ulceration(s) every visit Notes: Electronic Signature(s) Signed: 08/18/2022 12:07:54 PM By: Carlene Coria RN Entered By: Carlene Coria on 08/17/2022 12:51:13 [name] ([id number]) -------------------------------------------------------------------------------- Pain Assessment Details Patient Name: [name] Date of Service: 08/17/2022 12:30 PM Medical Record Number: [id number] Patient Account Number: [id number] Date of Birth/Sex: [birth year] (56 y.o. M) Treating RN: Carlene Coria Primary Care Audryana Hockenberry: PATIENT, NO Other Clinician: Referring Flemon Kelty: Fredirick Maudlin Treating Gaius Ishaq/Extender: Skipper Cliche in Treatment: 9 Active Problems Location of Pain Severity and Description of Pain Patient Has Paino No Site Locations Pain Management and Medication Current Pain Management: Electronic Signature(s) Signed: 08/18/2022 12:07:54 PM By: Carlene Coria RN Entered By: Carlene Coria on 08/17/2022 12:45:23 [name] ([id number]) -------------------------------------------------------------------------------- Patient/Caregiver Education Details Patient Name: [name] Date of Service: 08/17/2022  12:30 PM Medical Record Number: [id number] Patient Account Number: [id number] Date of Birth/Gender: Aug 12, 1966 (56 y.o. M) Treating RN: Carlene Coria Primary Care Physician: PATIENT, NO Other Clinician: Referring Physician: Fredirick Maudlin Treating Physician/Extender: Skipper Cliche in Treatment: 9 Education Assessment Education Provided To: Patient Education Topics Provided Infection: Methods: Explain/Verbal Responses: State content correctly Electronic Signature(s) Signed: 08/18/2022 12:07:54 PM By: Carlene Coria RN Entered By: Carlene Coria on 08/17/2022 12:59:07 [name] ([id number]) -------------------------------------------------------------------------------- Wound Assessment Details Patient Name: [name] Date of Service: 08/17/2022 12:30 PM Medical Record Number: [id number] Patient Account Number: [id number] Date of Birth/Sex: 1966/11/05 (56 y.o. M) Treating RN: Carlene Coria Primary Care Parminder Cupples: PATIENT, NO Other Clinician: Referring Brittania Sudbeck: Fredirick Maudlin Treating Peniel Biel/Extender: Skipper Cliche in Treatment: 9 Wound Status Wound Number: 1 Primary Etiology: Diabetic Wound/Ulcer of the Lower Extremity Wound Location: Left, Plantar Foot Wound Status: Open Wounding Event: Gradually Appeared Comorbid History: Type II Diabetes Date Acquired: 12/04/2021 Weeks Of Treatment: 9 Clustered Wound: No Pending Amputation On Presentation Photos Wound Measurements Length: (cm) 11 % Redu Width: (cm) 7.5 % Redu Depth: (cm) 0.2 Epithe Area: (cm) 64.795 [place] Volume: (cm) 12.959 Under ction in Area: 35.5% ction in Volume: 74.2% lialization: None ling: No mining: No Wound Description Classification: Grade 4 Foul Exudate Amount: Medium Sloug Exudate Type: Serosanguineous Exudate Color: red, brown Odor After Cleansing: No h/Fibrino Yes Wound Bed  Granulation Amount: Large (67-100%) Exposed Structure Granulation Quality: Pink, Pale Fascia Exposed:  No Necrotic Amount: Small (1-33%) Fat Layer (Subcutaneous Tissue) Exposed: Yes Necrotic Quality: Adherent Slough Tendon Exposed: No Muscle Exposed: No Joint Exposed: No Bone Exposed: No Treatment Notes Wound #1 (Foot) Wound Laterality: Plantar, Left Cleanser Dakin 16 (oz) 0.25 Discharge Instruction: Use as directed. Peri-Wound Care RODDERICK, HOLTZER ([id number]) Topical Primary Dressing Gauze Discharge Instruction: moistened with 1/4 Dakins Solution Secondary Dressing Kerlix 4.5 x 4.1 (in/yd) Discharge Instruction: Apply Kerlix 4.5 x 4.1 (in/yd) as instructed Secured With [place] H Soft Cloth Surgical Tape, 2x2 (in/yd) Compression Wrap Compression Stockings Add-Ons Electronic Signature(s) Signed: 08/18/2022 12:07:54 PM By: Carlene Coria RN Entered By: Carlene Coria on 08/17/2022 12:50:13 [name] ([id number]) -------------------------------------------------------------------------------- Vitals Details Patient Name: [name] Date of Service: 08/17/2022 12:30 PM Medical Record Number: [id number] Patient Account Number: [id number] Date of Birth/Sex: [birth year] (56 y.o. M) Treating RN: Carlene Coria Primary Care Alya Smaltz: PATIENT, NO Other Clinician: Referring Joao Mccurdy: Fredirick Maudlin Treating Eulice Rutledge/Extender: Skipper Cliche in Treatment: 9 Vital Signs Time Taken: 12:44 Temperature (F): 98.3 Height (in): 70 Pulse (bpm): 86 Weight (lbs): 230 Respiratory Rate (breaths/min): 16 Body Mass Index (BMI): 33 Blood Pressure (mmHg): 162/82 Reference Range: 80 - 120 mg / dl Electronic Signature(s) Signed: 08/18/2022 12:07:54 PM By: Carlene Coria RN Entered By: Carlene Coria on 08/17/2022 12:45:13

## 2022-08-21 ENCOUNTER — Encounter: Payer: 59 | Admitting: Physician Assistant

## 2022-08-22 ENCOUNTER — Other Ambulatory Visit: Payer: Self-pay | Admitting: Family Medicine

## 2022-08-22 ENCOUNTER — Encounter: Payer: 59 | Admitting: Physician Assistant

## 2022-08-22 DIAGNOSIS — E1169 Type 2 diabetes mellitus with other specified complication: Secondary | ICD-10-CM

## 2022-08-22 NOTE — Telephone Encounter (Unsigned)
Copied from [place] [phone]. Topic: General - Other >> Aug 22, 2022  4:19 PM Everette C wrote: Reason for CRM: Medication Refill - Medication: Rx #: [id number]  Insulin NPH, Human,, Isophane, (HUMULIN N KWIKPEN) 100 UNIT/ML Mayer Masker [[id number]]   Rx #: [id number]  Insulin Pen Needle 32G X 4 MM MISC [[id number]]    Has the patient contacted their pharmacy? Yes.  The patient has been directed to contact their PCP (Agent: If no, request that the patient contact the pharmacy for the refill. If patient does not wish to contact the pharmacy document the reason why and proceed with request.) (Agent: If yes, when and what did the pharmacy advise?)  Preferred Pharmacy (with phone number or street name): [id number] - [place], [place] [address] [place] [address] Phone: [phone] Fax: [phone] Hours: Open 24 hours   Has the patient been seen for an appointment in the last year OR does the patient have an upcoming appointment? Yes.    Agent: Please be advised that RX refills may take up to 3 business days. We ask that you follow-up with your pharmacy.

## 2022-08-23 ENCOUNTER — Encounter: Payer: 59 | Admitting: Internal Medicine

## 2022-08-23 MED ORDER — HUMULIN N KWIKPEN 100 UNIT/ML ~~LOC~~ SUPN: 20.0000 [IU] | PEN_INJECTOR | Freq: Two times a day (BID) | SUBCUTANEOUS | 5 refills | Status: DC

## 2022-08-23 MED ORDER — INSULIN PEN NEEDLE 32G X 4 MM MISC: 0 refills | Status: DC

## 2022-08-23 NOTE — Telephone Encounter (Signed)
Change of pharmacy  Requested Prescriptions  Pending Prescriptions Disp Refills  . Insulin NPH, Human,, Isophane, (HUMULIN N KWIKPEN) 100 UNIT/ML Kiwkpen 30 mL 5    Sig: Inject 20 Units into the skin 2 (two) times daily.     Endocrinology:  Diabetes - Insulins Failed - 08/22/2022  5:43 PM      Failed - HBA1C is between 0 and 7.9 and within 180 days    Hgb A1c MFr Bld  Date Value Ref Range Status  05/07/2022 11.5 (H) 4.8 - 5.6 % Final    Comment:    (NOTE) Pre diabetes:          5.7%-6.4%  Diabetes:              >6.4%  Glycemic control for   <7.0% adults with diabetes          Passed - Valid encounter within last 6 months    Recent Outpatient Visits          1 month ago Type 2 diabetes mellitus with other specified complication, with long-term current use of insulin [facility])   [place], [place], [place]   2 months ago Type 2 diabetes mellitus with other specified complication, with long-term current use of insulin ([place])   [place], [place]      Future Appointments            In 6 days Tsosie Billing, MD [place]   In 2 weeks Charlott Rakes, MD [place]           . Insulin Pen Needle 32G X 4 MM MISC 100 each 0    Sig: Use as directed to inject insulin up to 4 times daily.     Endocrinology: Diabetes - Testing Supplies Passed - 08/22/2022  5:43 PM      Passed - Valid encounter within last 12 months    Recent Outpatient Visits          1 month ago Type 2 diabetes mellitus with other specified complication, with long-term current use of insulin [facility])   [place], [place], [place]   2 months ago Type 2 diabetes mellitus with other specified complication, with long-term current use of insulin [facility])   [place], [place], [place]      Future Appointments             In 6 days Tsosie Billing, MD [place]   In 2 weeks Charlott Rakes, MD [place]

## 2022-08-24 ENCOUNTER — Encounter: Payer: 59 | Admitting: Physician Assistant

## 2022-08-24 DIAGNOSIS — E11621 Type 2 diabetes mellitus with foot ulcer: Secondary | ICD-10-CM | POA: Diagnosis not present

## 2022-08-24 DIAGNOSIS — E1169 Type 2 diabetes mellitus with other specified complication: Secondary | ICD-10-CM | POA: Diagnosis not present

## 2022-08-24 DIAGNOSIS — L97522 Non-pressure chronic ulcer of other part of left foot with fat layer exposed: Secondary | ICD-10-CM | POA: Diagnosis not present

## 2022-08-25 ENCOUNTER — Encounter: Payer: 59 | Admitting: Physician Assistant

## 2022-08-25 NOTE — Progress Notes (Signed)
KARAN, RAMNAUTH ([id number]) Visit Report for 08/24/2022 Chief Complaint Document Details Patient Name: [name], [name] Date of Service: 08/24/2022 12:30 PM Medical Record Number: [id number] Patient Account Number: [id number] Date of Birth/Sex: [birth year] (56 y.o. 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 M) Treating RN: Yevonne Pax Primary Care Provider: PATIENT, NO Other Clinician: Referring Provider: Duanne Guess Treating Provider/Extender: Rowan Blase in Treatment: 10 History of Present Illness HPI Description: Notes from [facility] under Dr. America Brown care:   ADMISSION6/15/2023This is a 56 year old poorly controlled type II diabetic (last A1c 11.5%). In January of this year, he presented to the hospital with an abscess in his left foot. He also had a foreign body present. It was removed in the ER and he was admitted to the hospital for IV antibiotics. He was subsequently discharged on oral antibiotics. Due to financial concerns, he has not taken his diabetic medications (metformin and 70/30 insulin) and as a result, presented back to the emergency room on June 1 with worsening findings, including frank pus and osteomyelitis on MRI. BKA was recommended, but he declined. He was taken to the  operating room by Dr. Lajoyce Corners who performed an extensive debridement. Cultures were taken and he was placed on IV antibiotics. He saw infectious disease while in the hospital who prescribed a 6-week course of IV antibiotics including cefazolin as well as oral metronidazole and levofloxacin. The infectious disease provider also indicated that he would benefit from below-knee amputation, but the patient desired another opinion and therefore he has been referred to wound care center for further evaluation and management. ABIs obtained while he was in the hospital demonstrate adequate blood flow. Essentially the entire bottom of the patient's left foot has been removed. The heads of the majority of his metatarsal bones are exposed along with their accompanying tendons. Muscle and fat are exposed as well. The wound surface is fairly dry; the patient reports that he was not given any specific wound care instructions other than to place a dry dressing on it after showering. There is no odor or purulent drainage identified. 05/26/2022: The wound measures a little bit smaller today. There is some good granulation tissue beginning to form on the surface. He still has exposed bone and tendon. There is some slough accumulation. He saw infectious disease earlier this week and was frustrated that they recommended amputation. He is interested in another surgical opinion. He continues to smoke but says that he has cut down. He is using a knee scooter for mobility so that he can stay off of his foot. 06/05/2022: He saw Dr. Lajoyce Corners last week who was supportive of our efforts to try to salvage the foot. I referred him to vascular surgery to see Dr. Chestine Spore but he has not received an appointment yet. He continues on IV antibiotics. He says that after our conversation last week about him being unable to initiate hyperbarics if he was going to continue to smoke, he has not had a cigarette since. He is now at 4 weeks of  conventional management and his insurance was initiated on July 1. Today, the wound continues to show evidence of improvement. There is good granulation tissue forming. The metatarsals are still exposed but actually have some pink periosteum. There is still some slough and fibrinous exudate present. No odor or purulent drainage. We have just been using Dakin's dressing changes for now. Admission to Wound Care Center at [facility]: 06-15-2022 upon evaluation today patient presents for initial inspection here in our clinic though he has been seen by Dr. Lady Gary in [place] up to this point. I did review her notes and records as well they have been using Dakin's moistened gauze dressing which has done a great job at helping to clean out the bottom of his foot. Please see the discourse above for additional information in regard to treatment course up until the patient arrives here in the [place] clinic today. The patient is ready to get started with HBO therapy as soon as possible I do think based on all my review of his records this seems to be appropriate he does have chronic osteomyelitis is also been offered amputation this is obviously a limb salvage operation here. We are trying to prevent him from having to undergo an extensive amputation which would likely be a below-knee amputation which in turn is going to affect his quality of life he wants to try to avoid that if at all possible I think hyperbaric oxygen therapy is his best bet to achieve that goal. Currently he has been on IV Ancef. That actually ends tomorrow. Following that according to notes it appears he supposed to be taking Levaquin and Flagyl for an additional period of time although I am not certain exactly how long that with the. He has been seeing regional physicians infectious disease in [place] and again that so I recommended that he contact today in order to find out what the plan is going forward. His most recent hemoglobin A1c  as noted above was 11.5 on 05-07-2022 he tells me trying to work on that he is also quit smoking as of this point. 06-22-2022 upon evaluation today patient appears to be doing well currently in regard to his wound all things considered. He is showing signs of a lot of new granulation tissue and overall very pleased. He is changing this once a day with the Dakin's moistened gauze dressing. Fortunately I do not see any evidence of infection locally or systemically at this time. We have been trying to get him into the hyperbaric chamber he was having a lot of issues with sinus pressure and we have made referral and called multiple ENT specialist the earliest we have been able to get his August 9 at [facility] ear nose throat. They are going to try to work him in sooner if they have any opportunities to do so. Obviously I think the sooner he can do this the better. We need to get him in hyperbarics as quickly as possible. 7/26; patient presents for follow-up. He has been using Dakin's wet-to-dry dressings and oil emollient dressing over the exposed bone. He currently denies systemic signs of infection. He states he is scheduled to see infectious disease, Dr Thedore Mins next week. She had ordered an MRI of the left foot and he is scheduled to have this done tomorrow. He currently denies systemic signs of infection Including fever/chills, nausea/vomiting increased warmth or erythema to the wound bed or purulent drainage. He started hyperbaric oxygen treatment however did not tolerate this due to sinus pressure.  He is scheduled to see ENT in 2 weeks for evaluation. He is not continuing HBO until he is cleared by ENT. 07-06-2022 patient presents today for follow-up concerning his plantar foot ulcer. The good news is this actually is significantly better compared to previous. Some of the bone which is necrotic is starting to slough off and I am going to perform debridement to clear this away today. Nonetheless I do think  that the patient is going to need hyperbarics and we need to try to get it as soon as possible. He still having a lot of sinus pressure I can even hear the congestion today. He is on 3 different antibiotics cefadroxil, metronidazole, and Levaquin currently for the osteomyelitis. For that reason this should actually help with the infection if he does have a chronic sinusitis I am also going to see about getting him on steroids to try to see if this can benefit him as well. He voiced understanding and he is in agreement with giving that shot. This is probably can to spike his blood sugars which I discussed with him he is going to be seeing his primary care provider later today I want him to let her know [name], [name] ([id number]) as well what is going on and why so that she could be aware but I really do think he needs to take the prednisone. We need to get him in the chamber as soon as possible to try to help with getting this wound to heal and closed so that he does not lose his foot. 07-13-2022 upon evaluation today patient appears to be doing better in regard to his wound he continues to show signs of new granulation am going to perform some debridement today but overall this is doing quite well. 07-20-2022 upon evaluation today patient's wound is actually showing some signs of improvement he does have 1 area on the fifth metatarsal region where there is some necrotic tendon that is noted at this point. Unfortunately this is preventing good granulation and over this point the rest of the metatarsal region is completely covered over with granulation tissue which is great news there is some other slough and biofilm buildup that I will clearway as well. 07-27-2022 upon evaluation today patient appears to be doing well currently in regard to his plantar foot ulcer. He has been tolerating the dressing changes without complication fortunately. I do believe he is making excellent progress and each time I see him  this is better and better is pretty much completely covered as far as any bone exposure is concerned and he still is on the IV antibiotics were still also undergoing hyperbarics at this point that he just got started and is doing quite well with since getting the tubes in his ears.Marland Kitchen 08-03-2022 upon evaluation today patient appears to be doing well in general although has been having a lot of drainage compared to normal. It was actually leaking through his dressing today. He tells me he changed it last night. With that being said he has been up on his feet a lot more which I think is probably a big part of the issue here. Fortunately I do not see any signs of infection locally or systemically. 08-17-2022 upon evaluation today patient is doing well with regard to his wound this is actually measuring smaller which is good news. I am very pleased in that regard. With that being said he has not been participating HBO therapy as he really needs to afford to see  this heal appropriately and completely. In fact he has only made 9 total HBO treatments in the past 4 weeks. Again this is not therapeutic and I discussed that with the patient. Nonetheless he is still improving and since the hyperbarics is not helping based on the discussion today the patient is going to discontinue HBO therapy at this point. He tells me that getting here 5 days a week just is not possible. He has had difficulty getting here and then also when he was here had difficulty with getting here at the right time which has also been part of the problem. Nonetheless either way I am pleased that he is doing better and my hope is that we will still be able to get him healed obviously he should continue with the antibiotics as well as the wound care as he has been doing this seems to be doing excellent for him but everything is improving quite nicely. 08-24-2022 upon evaluation today patient appears to be doing well currently in regard to his wound  this is actually measuring significantly better even compared to last week. I am very pleased in that regard I do not see any signs of infection and I think he is making excellent progress here. Electronic Signature(s) Signed: 08/24/2022 1:56:48 PM By: Lenda Kelp PA-C Entered By: Lenda Kelp on 08/24/2022 13:56:47 [name], [name] ([id number]) -------------------------------------------------------------------------------- Physical Exam Details Patient Name: [name] Date of Service: 08/24/2022 12:30 PM Medical Record Number: [id number] Patient Account Number: [id number] Date of Birth/Sex: 1966/06/16 (56 y.o. M) Treating RN: Yevonne Pax Primary Care Provider: PATIENT, NO Other Clinician: Referring Provider: Duanne Guess Treating Provider/Extender: Rowan Blase in Treatment: 10 Constitutional Well-nourished and well-hydrated in no acute distress. Respiratory normal breathing without difficulty. Psychiatric this patient is able to make decisions and demonstrates good insight into disease process. Alert and Oriented x 3. pleasant and cooperative. Notes Upon inspection patient's wound bed actually showed signs of good granulation and epithelization at this point. Fortunately I see no evidence of infection locally or systemically which is great news and overall I am extremely pleased with where we stand currently. Electronic Signature(s) Signed: 08/24/2022 1:57:03 PM By: Lenda Kelp PA-C Entered By: Lenda Kelp on 08/24/2022 13:57:02 [name] ([id number]) -------------------------------------------------------------------------------- Physician Orders Details Patient Name: [name] Date of Service: 08/24/2022 12:30 PM Medical Record Number: [id number] Patient Account Number: [id number] Date of Birth/Sex: 05/08/1966 (56 y.o. M) Treating RN: Yevonne Pax Primary Care Provider: PATIENT, NO Other Clinician: Referring Provider: Duanne Guess Treating  Provider/Extender: Rowan Blase in Treatment: 10 Verbal / Phone Orders: No Diagnosis Coding Follow-up Appointments o Return Appointment in 1 week. Bathing/ Shower/ Hygiene o May shower; gently cleanse wound with antibacterial soap, rinse and pat dry prior to dressing wounds Anesthetic (Use 'Patient Medications' Section for Anesthetic Order Entry) o Lidocaine applied to wound bed Edema Control - Lymphedema / Segmental Compressive Device / Other o Elevate, Exercise Daily and Avoid Standing for Long Periods of Time. o Elevate legs to the level of the heart and pump ankles as often as possible o Elevate leg(s) parallel to the floor when sitting. Wound Treatment Wound #1 - Foot Wound Laterality: Plantar, Left Cleanser: Dakin 16 (oz) 0.25 1 x Per Day/30 Days Discharge Instructions: Use as directed. Primary Dressing: Gauze (Generic) 1 x Per Day/30 Days Discharge Instructions: moistened with 1/4 Dakins Solution Secondary Dressing: ABD Pad 5x9 (in/in) (DME) (Generic) 1 x Per Day/30 Days Discharge Instructions: Cover with ABD pad Secondary Dressing: Kerlix  4.5 x 4.1 (in/yd) (Generic) 1 x Per Day/30 Days Discharge Instructions: Apply Kerlix 4.5 x 4.1 (in/yd) as instructed Secured With: Medipore Tape - 5M Medipore H Soft Cloth Surgical Tape, 2x2 (in/yd) (Generic) 1 x Per Day/30 Days Electronic Signature(s) Signed: 08/25/2022 9:44:45 AM By: Yevonne PaxEpps, Carrie RN Signed: 08/25/2022 1:36:52 PM By: Lenda KelpStone III, Marvina Danner PA-C Entered By: Yevonne PaxEpps, Carrie on 08/24/2022 12:48:54 Matthew JanskyMARTIN, [name] ([id number]) -------------------------------------------------------------------------------- Problem List Details Patient Name: Matthew JanskyMARTIN, [name] Date of Service: 08/24/2022 12:30 PM Medical Record Number: [id number] Patient Account Number: [id number] Date of Birth/Sex: [birth year] (56 y.o. 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 America Brownannon's care:   ADMISSION6/15/2023This is a 56 year old poorly controlled type II diabetic (last A1c 11.5%). In January of this year, he presented to the hospital with an abscess in his left foot. He also had a foreign body present. It was removed in the ER and he was admitted to the hospital for IV antibiotics. He was subsequently discharged on oral antibiotics. Due to financial concerns, he has not taken his diabetic medications (metformin and 70/30 insulin) and as a result, presented back to the emergency room on June 1 with worsening findings, including frank pus and osteomyelitis on MRI. BKA was recommended, but he declined. He was taken to the operating room by Dr.  Lajoyce Cornersuda who performed an extensive debridement. Cultures were taken and he was placed on IV antibiotics. He saw infectious disease while in the hospital who prescribed a 6-week course of IV antibiotics including cefazolin as well as oral metronidazole and levofloxacin. The infectious disease provider also indicated that he would benefit from below-knee amputation, but the patient desired another opinion and therefore he has been referred to wound care center for further evaluation and management. ABIs obtained while he was in the hospital demonstrate adequate blood flow. Essentially the entire bottom of the patient's left foot has been removed. The heads of the majority of his metatarsal bones are exposed along with their accompanying tendons. Muscle and fat are exposed as well. The wound surface is fairly dry; the patient reports that he was not given any specific wound care instructions other than to place a dry dressing on it after showering. There is no odor or purulent drainage identified. 05/26/2022: The wound measures a little bit smaller today. There is some good granulation tissue beginning to form on the surface. He still has exposed bone and tendon. There is some slough accumulation. He saw infectious disease earlier this week and was frustrated that  they recommended amputation. He is interested in another surgical opinion. He continues to smoke but says that he has cut down. He is using a knee scooter for mobility so that he can stay off of his foot. 06/05/2022: He saw Dr. Lajoyce Corners last week who was supportive of our efforts to try to salvage the foot. I referred him to vascular surgery to see Dr. Chestine Spore but he has not received an appointment yet. He continues on IV antibiotics. He says that after our conversation last week about him being unable to initiate hyperbarics if he was going to continue to smoke, he has not had a cigarette since. He is now at 4 weeks of conventional management and his  insurance was initiated on July 1. Today, the wound continues to show evidence of improvement. There is good granulation tissue forming. The metatarsals are still exposed but actually have some pink periosteum. There is still some slough and fibrinous exudate present. No odor or purulent drainage. We have just been using Dakin's dressing changes for now. Admission to Wound Care Center at [facility] 06-15-2022 upon evaluation today patient presents for initial inspection here in our clinic though he has been seen by Dr. Lady Gary in [place] up to this point. I did review her notes and records as well they have been using Dakin's moistened gauze dressing which has done a great job at helping to clean out the bottom of his foot. Please see the discourse above for additional information in regard to treatment course up until the patient arrives here in the [place] clinic today. The patient is ready to get started with HBO therapy as soon as possible I do think based on all my review of his records this seems to be appropriate he does have chronic osteomyelitis is also been offered amputation this is obviously a limb salvage operation here. We are trying to prevent him from having to undergo an extensive amputation which would likely be a below-knee amputation which in turn is going to affect his quality of life he wants to try to avoid that if at all possible I think hyperbaric oxygen therapy is his best bet to achieve that goal. Currently he has been on IV Ancef. That actually ends tomorrow. Following that according to notes it appears he supposed to be taking Levaquin and Flagyl for an additional period of time although I am not certain exactly how long that with the. He has been seeing regional physicians infectious disease in [place] and again that so I recommended that he contact today in order to find out what the plan is going forward. His most recent hemoglobin A1c as noted above was 11.5 on  05-07-2022 he tells me trying to work on that he is also quit smoking as of this point. 06-22-2022 upon evaluation today patient appears to be doing well currently in regard to his wound all things considered. He is showing signs of a lot of new granulation tissue and overall very pleased. He is changing this once a day with the Dakin's moistened gauze dressing. Fortunately I do not see any evidence of infection locally or systemically at this time. We have been trying to get him into the hyperbaric chamber he was having a lot of issues with sinus pressure and we have made referral and called multiple ENT specialist the earliest we have been able to get his August 9 at [facility] ear nose throat. They are going to try to work him in sooner if they have any opportunities  to do so. Obviously I think the sooner he can do this the better. We need to get him in hyperbarics as quickly as possible. 7/26; patient presents for follow-up. He has been using Dakin's wet-to-dry dressings and oil emollient dressing over the exposed bone. He currently denies systemic signs of infection. He states he is scheduled to see infectious disease, Dr Thedore Mins next week. She had ordered an MRI of the left foot and he is scheduled to have this done tomorrow. He currently denies systemic signs of infection Including fever/chills, nausea/vomiting increased warmth or erythema to the wound bed or purulent drainage. He started hyperbaric oxygen treatment however did not tolerate this due to sinus pressure. He is scheduled to see ENT in 2 weeks for evaluation. He is not continuing HBO until he is cleared by ENT. 07-06-2022 patient presents today for follow-up concerning his plantar foot ulcer. The good news is this actually is significantly better compared to previous. Some of the bone which is necrotic is starting to slough off and I am going to perform debridement to clear this away today. [name], [name] ([id number]) Nonetheless I do think  that the patient is going to need hyperbarics and we need to try to get it as soon as possible. He still having a lot of sinus pressure I can even hear the congestion today. He is on 3 different antibiotics cefadroxil, metronidazole, and Levaquin currently for the osteomyelitis. For that reason this should actually help with the infection if he does have a chronic sinusitis I am also going to see about getting him on steroids to try to see if this can benefit him as well. He voiced understanding and he is in agreement with giving that shot. This is probably can to spike his blood sugars which I discussed with him he is going to be seeing his primary care provider later today I want him to let her know as well what is going on and why so that she could be aware but I really do think he needs to take the prednisone. We need to get him in the chamber as soon as possible to try to help with getting this wound to heal and closed so that he does not lose his foot. 07-13-2022 upon evaluation today patient appears to be doing better in regard to his wound he continues to show signs of new granulation am going to perform some debridement today but overall this is doing quite well. 07-20-2022 upon evaluation today patient's wound is actually showing some signs of improvement he does have 1 area on the fifth metatarsal region where there is some necrotic tendon that is noted at this point. Unfortunately this is preventing good granulation and over this point the rest of the metatarsal region is completely covered over with granulation tissue which is great news there is some other slough and biofilm buildup that I will clearway as well. 07-27-2022 upon evaluation today patient appears to be doing well currently in regard to his plantar foot ulcer. He has been tolerating the dressing changes without complication fortunately. I do believe he is making excellent progress and each time I see him this is better and  better is pretty much completely covered as far as any bone exposure is concerned and he still is on the IV antibiotics were still also undergoing hyperbarics at this point that he just got started and is doing quite well with since getting the tubes in his ears.Marland Kitchen 08-03-2022 upon evaluation today patient appears to  be doing well in general although has been having a lot of drainage compared to normal. It was actually leaking through his dressing today. He tells me he changed it last night. With that being said he has been up on his feet a lot more which I think is probably a big part of the issue here. Fortunately I do not see any signs of infection locally or systemically. 08-17-2022 upon evaluation today patient is doing well with regard to his wound this is actually measuring smaller which is good news. I am very pleased in that regard. With that being said he has not been participating HBO therapy as he really needs to afford to see this heal appropriately and completely. In fact he has only made 9 total HBO treatments in the past 4 weeks. Again this is not therapeutic and I discussed that with the patient. Nonetheless he is still improving and since the hyperbarics is not helping based on the discussion today the patient is going to discontinue HBO therapy at this point. He tells me that getting here 5 days a week just is not possible. He has had difficulty getting here and then also when he was here had difficulty with getting here at the right time which has also been part of the problem. Nonetheless either way I am pleased that he is doing better and my hope is that we will still be able to get him healed obviously he should continue with the antibiotics as well as the wound care as he has been doing this seems to be doing excellent for him but everything is improving quite nicely. 08-24-2022 upon evaluation today patient appears to be doing well currently in regard to his wound this is actually  measuring significantly better even compared to last week. I am very pleased in that regard I do not see any signs of infection and I think he is making excellent progress here. Objective Constitutional Well-nourished and well-hydrated in no acute distress. Vitals Time Taken: 12:38 PM, Height: 70 in, Weight: 230 lbs, BMI: 33, Temperature: 98.3 F, Pulse: 78 bpm, Respiratory Rate: 16 breaths/min, Blood Pressure: 176/98 mmHg. Respiratory normal breathing without difficulty. Psychiatric this patient is able to make decisions and demonstrates good insight into disease process. Alert and Oriented x 3. pleasant and cooperative. General Notes: Upon inspection patient's wound bed actually showed signs of good granulation and epithelization at this point. Fortunately I see no evidence of infection locally or systemically which is great news and overall I am extremely pleased with where we stand currently. Integumentary (Hair, Skin) Wound #1 status is Open. Original cause of wound was Gradually Appeared. The date acquired was: 12/04/2021. The wound has been in treatment 10 weeks. The wound is located on the [place]. The wound measures 9cm length x 7.2cm width x 0.2cm depth; 50.894cm^2 area and 10.179cm^3 volume. There is Fat Layer (Subcutaneous Tissue) exposed. There is no tunneling or undermining noted. There is a medium amount of serosanguineous drainage noted. There is large (67-100%) pink, pale granulation within the wound bed. There is a small (1-33%) amount of necrotic tissue within the wound bed including Adherent Slough. Assessment [name], [name] ([id number]) Active Problems ICD-10 Other chronic osteomyelitis, left ankle and foot Type 2 diabetes mellitus with foot ulcer Non-pressure chronic ulcer of other part of left foot with necrosis of bone Plan Follow-up Appointments: Return Appointment in 1 week. Bathing/ Shower/ Hygiene: May shower; gently cleanse wound with antibacterial  soap, rinse and pat dry prior to  dressing wounds Anesthetic (Use 'Patient Medications' Section for Anesthetic Order Entry): Lidocaine applied to wound bed Edema Control - Lymphedema / Segmental Compressive Device / Other: Elevate, Exercise Daily and Avoid Standing for Long Periods of Time. Elevate legs to the level of the heart and pump ankles as often as possible Elevate leg(s) parallel to the floor when sitting. WOUND #1: - Foot Wound Laterality: Plantar, Left Cleanser: Dakin 16 (oz) 0.25 1 x Per Day/30 Days Discharge Instructions: Use as directed. Primary Dressing: Gauze (Generic) 1 x Per Day/30 Days Discharge Instructions: moistened with 1/4 Dakins Solution Secondary Dressing: ABD Pad 5x9 (in/in) (DME) (Generic) 1 x Per Day/30 Days Discharge Instructions: Cover with ABD pad Secondary Dressing: Kerlix 4.5 x 4.1 (in/yd) (Generic) 1 x Per Day/30 Days Discharge Instructions: Apply Kerlix 4.5 x 4.1 (in/yd) as instructed Secured With: Medipore Tape - 24M Medipore H Soft Cloth Surgical Tape, 2x2 (in/yd) (Generic) 1 x Per Day/30 Days 1. I am going to suggest that we go ahead and continue with the recommendation for Dakin's moistened gauze dressings which actually seem to be doing quite well in fact he is measuring significantly smaller today. 2. I am also can recommend that we have the patient continue with the ABD pads to cover roll gauze to secure in place. 3. I am also can recommend the patient should continue to monitor for any signs of worsening or infection if anything changes he should let me know. The backup plan would be for [place] to proceed with [facility] as the dressing of choice if indeed this does stall out but right now is doing so well I do want to make any changes. We will see patient back for reevaluation in 1 week here in the clinic. If anything worsens or changes patient will contact our office for additional recommendations. Electronic Signature(s) Signed: 08/24/2022  1:57:49 PM By: Lenda Kelp PA-C Entered By: Lenda Kelp on 08/24/2022 13:57:49 [name] ([id number]) -------------------------------------------------------------------------------- SuperBill Details Patient Name: [name] Date of Service: 08/24/2022 Medical Record Number: [id number] Patient Account Number: [id number] Date of Birth/Sex: 08-22-1966 (56 y.o. M) Treating RN: Yevonne Pax Primary Care Provider: PATIENT, NO Other Clinician: Referring Provider: Duanne Guess Treating Provider/Extender: Rowan Blase in Treatment: 10 Diagnosis Coding ICD-10 Codes Code Description [phone] Other chronic osteomyelitis, left ankle and foot E11.621 Type 2 diabetes mellitus with foot ulcer L97.524 Non-pressure chronic ulcer of other part of left foot with necrosis of bone Facility Procedures CPT4 Code: [id number] Description: [id number] - WOUND CARE VISIT-LEV 2 EST PT Modifier: Quantity: 1 Physician Procedures CPT4 Code: 2952841 Description: 99213 - WC PHYS LEVEL 3 - EST PT Modifier: Quantity: 1 CPT4 Code: Description: ICD-10 Diagnosis Description M86.672 Other chronic osteomyelitis, left ankle and foot E11.621 Type 2 diabetes mellitus with foot ulcer L97.524 Non-pressure chronic ulcer of other part of left foot with necrosis of Modifier: bone Quantity: Electronic Signature(s) Signed: 08/24/2022 1:58:03 PM By: Lenda Kelp PA-C Entered By: Lenda Kelp on 08/24/2022 13:58:02

## 2022-08-25 NOTE — Progress Notes (Signed)
SAJJAD, HONEA ([id number]) Visit Report for 08/24/2022 Arrival Information Details Patient Name: [name], [name] Date of Service: 08/24/2022 12:30 PM Medical Record Number: [id number] Patient Account Number: [id number] Date of Birth/Sex: [birth year] (56 y.o. M) Treating RN: Carlene Coria Primary Care Ainhoa Rallo: PATIENT, NO Other Clinician: Referring Deaven Urwin: Fredirick Maudlin Treating Camron Essman/Extender: Skipper Cliche in Treatment: 10 Visit Information History Since Last Visit All ordered tests and consults were completed: No Patient Arrived: Knee Scooter Added or deleted any medications: No Arrival Time: 12:35 Any new allergies or adverse reactions: No Accompanied By: self Had a fall or experienced change in No Transfer Assistance: None activities of daily living that may affect Patient Identification Verified: Yes risk of falls: Secondary Verification Process Completed: Yes Signs or symptoms of abuse/neglect since last visito No Patient Requires Transmission-Based Precautions: No Hospitalized since last visit: No Patient Has Alerts: No Implantable device outside of the clinic excluding No cellular tissue based products placed in the center since last visit: Has Dressing in Place as Prescribed: Yes Pain Present Now: No Electronic Signature(s) Signed: 08/25/2022 9:44:45 AM By: Carlene Coria RN Entered By: Carlene Coria on 08/24/2022 12:38:05 [name] ([id number]) -------------------------------------------------------------------------------- Clinic Level of Care Assessment Details Patient Name: [name] Date of Service: 08/24/2022 12:30 PM Medical Record Number: [id number] Patient Account Number: [id number] Date of Birth/Sex: [birth year] (56 y.o. M) Treating RN: Carlene Coria Primary Care Savino Whisenant: PATIENT, NO Other Clinician: Referring Kendarrius Tanzi: Fredirick Maudlin Treating Jennings Corado/Extender: Skipper Cliche in Treatment: 10 Clinic Level of Care Assessment Items TOOL 4 Quantity  Score X - Use when only an EandM is performed on FOLLOW-UP visit 1 0 ASSESSMENTS - Nursing Assessment / Reassessment X - Reassessment of Co-morbidities (includes updates in patient status) 1 10 X- 1 5 Reassessment of Adherence to Treatment Plan ASSESSMENTS - Wound and Skin Assessment / Reassessment X - Simple Wound Assessment / Reassessment - one wound 1 5 []  - 0 Complex Wound Assessment / Reassessment - multiple wounds []  - 0 Dermatologic / Skin Assessment (not related to wound area) ASSESSMENTS - Focused Assessment []  - Circumferential Edema Measurements - multi extremities 0 []  - 0 Nutritional Assessment / Counseling / Intervention []  - 0 Lower Extremity Assessment (monofilament, tuning fork, pulses) []  - 0 Peripheral Arterial Disease Assessment (using hand held doppler) ASSESSMENTS - Ostomy and/or Continence Assessment and Care []  - Incontinence Assessment and Management 0 []  - 0 Ostomy Care Assessment and Management (repouching, etc.) PROCESS - Coordination of Care X - Simple Patient / Family Education for ongoing care 1 15 []  - 0 Complex (extensive) Patient / Family Education for ongoing care []  - 0 Staff obtains Programmer, systems, Records, Test Results / Process Orders []  - 0 Staff telephones HHA, Nursing Homes / Clarify orders / etc []  - 0 Routine Transfer to another Facility (non-emergent condition) []  - 0 Routine Hospital Admission (non-emergent condition) []  - 0 New Admissions / Biomedical engineer / Ordering NPWT, Apligraf, etc. []  - 0 Emergency Hospital Admission (emergent condition) X- 1 10 Simple Discharge Coordination []  - 0 Complex (extensive) Discharge Coordination PROCESS - Special Needs []  - Pediatric / Minor Patient Management 0 []  - 0 Isolation Patient Management []  - 0 Hearing / Language / Visual special needs []  - 0 Assessment of Community assistance (transportation, D/C planning, etc.) []  - 0 Additional assistance / Altered mentation []  -  0 Support Surface(s) Assessment (bed, cushion, seat, etc.) INTERVENTIONS - Wound Cleansing / Measurement Gowans, Cypher ([id number]) X- 1 5 Simple Wound Cleansing - one wound []  -  0 Complex Wound Cleansing - multiple wounds X- 1 5 Wound Imaging (photographs - any number of wounds) []  - 0 Wound Tracing (instead of photographs) X- 1 5 Simple Wound Measurement - one wound []  - 0 Complex Wound Measurement - multiple wounds INTERVENTIONS - Wound Dressings X - Small Wound Dressing one or multiple wounds 1 10 []  - 0 Medium Wound Dressing one or multiple wounds []  - 0 Large Wound Dressing one or multiple wounds []  - 0 Application of Medications - topical []  - 0 Application of Medications - injection INTERVENTIONS - Miscellaneous []  - External ear exam 0 []  - 0 Specimen Collection (cultures, biopsies, blood, body fluids, etc.) []  - 0 Specimen(s) / Culture(s) sent or taken to Lab for analysis []  - 0 Patient Transfer (multiple staff / Civil Service fast streamer / Similar devices) []  - 0 Simple Staple / Suture removal (25 or less) []  - 0 Complex Staple / Suture removal (26 or more) []  - 0 Hypo / Hyperglycemic Management (close monitor of Blood Glucose) []  - 0 Ankle / Brachial Index (ABI) - do not check if billed separately X- 1 5 Vital Signs Has the patient been seen at the hospital within the last three years: Yes Total Score: 75 Level Of Care: New/Established - Level 2 Electronic Signature(s) Signed: 08/25/2022 9:44:45 AM By: Carlene Coria RN Entered By: Carlene Coria on 08/24/2022 12:51:03 [name] ([id number]) -------------------------------------------------------------------------------- Encounter Discharge Information Details Patient Name: [name] Date of Service: 08/24/2022 12:30 PM Medical Record Number: [id number] Patient Account Number: [id number] Date of Birth/Sex: 1966/11/24 (56 y.o. M) Treating RN: Carlene Coria Primary Care Macon Lesesne: PATIENT, NO Other Clinician: Referring  Wyona Neils: Fredirick Maudlin Treating Lochlann Mastrangelo/Extender: Skipper Cliche in Treatment: 10 Encounter Discharge Information Items Discharge Condition: Stable Ambulatory Status: Knee Scooter Discharge Destination: Home Transportation: Private Auto Accompanied By: self Schedule Follow-up Appointment: Yes Clinical Summary of Care: Electronic Signature(s) Signed: 08/25/2022 9:44:45 AM By: Carlene Coria RN Entered By: Carlene Coria on 08/24/2022 12:51:48 [name] ([id number]) -------------------------------------------------------------------------------- Lower Extremity Assessment Details Patient Name: [name] Date of Service: 08/24/2022 12:30 PM Medical Record Number: [id number] Patient Account Number: [id number] Date of Birth/Sex: 1966/06/13 (56 y.o. M) Treating RN: Carlene Coria Primary Care Caster Fayette: PATIENT, NO Other Clinician: Referring Doreatha Offer: Fredirick Maudlin Treating Helen Winterhalter/Extender: Skipper Cliche in Treatment: 10 Edema Assessment Assessed: [Left: No] [Right: No] Edema: [Left: Ye] [Right: s] Calf Left: Right: Point of Measurement: 32 cm From Medial Instep 40 cm Ankle Left: Right: Point of Measurement: 11 cm From Medial Instep 22 cm Vascular Assessment Pulses: Dorsalis Pedis Palpable: [Left:Yes] Electronic Signature(s) Signed: 08/25/2022 9:44:45 AM By: Carlene Coria RN Entered By: Carlene Coria on 08/24/2022 12:44:10 [name] ([id number]) -------------------------------------------------------------------------------- Multi Wound Chart Details Patient Name: [name] Date of Service: 08/24/2022 12:30 PM Medical Record Number: [id number] Patient Account Number: [id number] Date of Birth/Sex: Apr 26, 1966 (56 y.o. M) Treating RN: Carlene Coria Primary Care Caelen Reierson: PATIENT, NO Other Clinician: Referring Allyssa Abruzzese: Fredirick Maudlin Treating Caldonia Leap/Extender: Skipper Cliche in Treatment: 10 Vital Signs Height(in): 70 Pulse(bpm): 60 Weight(lbs): 230 Blood  Pressure(mmHg): 176/98 Body Mass Index(BMI): 33 Temperature(F): 98.3 Respiratory Rate(breaths/min): 16 Photos: [N/A:N/A] Wound Location: Left, Plantar Foot N/A N/A Wounding Event: Gradually Appeared N/A N/A Primary Etiology: Diabetic Wound/Ulcer of the Lower N/A N/A Extremity Comorbid History: Type II Diabetes N/A N/A Date Acquired: 12/04/2021 N/A N/A Weeks of Treatment: 10 N/A N/A Wound Status: Open N/A N/A Wound Recurrence: No N/A N/A Pending Amputation on Yes N/A N/A Presentation: Measurements L x W x D (cm)  9x7.2x0.2 N/A N/A Area (cm) : 50.894 N/A N/A Volume (cm) : 10.179 N/A N/A % Reduction in Area: 49.40% N/A N/A % Reduction in Volume: 79.70% N/A N/A Classification: Grade 4 N/A N/A Exudate Amount: Medium N/A N/A Exudate Type: Serosanguineous N/A N/A Exudate Color: red, brown N/A N/A Granulation Amount: Large (67-100%) N/A N/A Granulation Quality: Pink, Pale N/A N/A Necrotic Amount: Small (1-33%) N/A N/A Exposed Structures: Fat Layer (Subcutaneous Tissue): N/A N/A Yes Fascia: No Tendon: No Muscle: No Joint: No Bone: No Epithelialization: None N/A N/A Treatment Notes Electronic Signature(s) Signed: 08/25/2022 9:44:45 AM By: Carlene Coria RN Entered By: Carlene Coria on 08/24/2022 12:44:22 [name] ([id number]) -------------------------------------------------------------------------------- Multi-Disciplinary Care Plan Details Patient Name: [name] Date of Service: 08/24/2022 12:30 PM Medical Record Number: [id number] Patient Account Number: [id number] Date of Birth/Sex: [birth year] (56 y.o. M) Treating RN: Carlene Coria Primary Care Zenya Hickam: PATIENT, NO Other Clinician: Referring Nilay Mangrum: Fredirick Maudlin Treating Zakyla Tonche/Extender: Skipper Cliche in Treatment: 10 Active Inactive Osteomyelitis Nursing Diagnoses: Infection: osteomyelitis Knowledge deficit related to disease process and management Potential for infection:  osteomyelitis Goals: Diagnostic evaluation for osteomyelitis completed as ordered Date Initiated: 06/28/2022 Date Inactivated: 08/03/2022 Target Resolution Date: 06/28/2022 Goal Status: Met Patient/caregiver will verbalize understanding of disease process and disease management Date Initiated: 06/28/2022 Target Resolution Date: 06/28/2022 Goal Status: Active Patient's osteomyelitis will resolve Date Initiated: 06/28/2022 Target Resolution Date: 06/28/2022 Goal Status: Active Signs and symptoms for osteomyelitis will be recognized and promptly addressed Date Initiated: 06/28/2022 Date Inactivated: 08/03/2022 Target Resolution Date: 06/28/2022 Goal Status: Met Interventions: Assess for signs and symptoms of osteomyelitis resolution every visit Provide education on osteomyelitis Screen for HBO Notes: Wound/Skin Impairment Nursing Diagnoses: Knowledge deficit related to ulceration/compromised skin integrity Goals: Patient/caregiver will verbalize understanding of skin care regimen Date Initiated: 06/15/2022 Date Inactivated: 08/03/2022 Target Resolution Date: 07/16/2022 Goal Status: Met Ulcer/skin breakdown will have a volume reduction of 30% by week 4 Date Initiated: 06/15/2022 Date Inactivated: 08/03/2022 Target Resolution Date: 07/16/2022 Goal Status: Unmet Unmet Reason: comorbities Ulcer/skin breakdown will have a volume reduction of 50% by week 8 Date Initiated: 06/15/2022 Target Resolution Date: 08/16/2022 Goal Status: Active Ulcer/skin breakdown will have a volume reduction of 80% by week 12 Date Initiated: 06/15/2022 Target Resolution Date: 09/15/2022 Goal Status: Active Ulcer/skin breakdown will heal within 14 weeks Date Initiated: 06/15/2022 Target Resolution Date: 10/16/2022 Goal Status: Active Interventions: Assess patient/caregiver ability to obtain necessary supplies Assess patient/caregiver ability to perform ulcer/skin care regimen upon admission and as needed ANAV, LAMMERT ([id number]) Assess ulceration(s) every visit Notes: Electronic Signature(s) Signed: 08/25/2022 9:44:45 AM By: Carlene Coria RN Entered By: Carlene Coria on 08/24/2022 12:44:15 [name] ([id number]) -------------------------------------------------------------------------------- Pain Assessment Details Patient Name: [name] Date of Service: 08/24/2022 12:30 PM Medical Record Number: [id number] Patient Account Number: [id number] Date of Birth/Sex: 1966-05-05 (56 y.o. M) Treating RN: Carlene Coria Primary Care Naavya Postma: PATIENT, NO Other Clinician: Referring Manvir Prabhu: Fredirick Maudlin Treating Ulysess Witz/Extender: Skipper Cliche in Treatment: 10 Active Problems Location of Pain Severity and Description of Pain Patient Has Paino No Site Locations Pain Management and Medication Current Pain Management: Electronic Signature(s) Signed: 08/25/2022 9:44:45 AM By: Carlene Coria RN Entered By: Carlene Coria on 08/24/2022 12:39:08 [name] ([id number]) -------------------------------------------------------------------------------- Patient/Caregiver Education Details Patient Name: [name] Date of Service: 08/24/2022 12:30 PM Medical Record Number: [id number] Patient Account Number: [id number] Date of Birth/Gender: Jul 22, 1966 (56 y.o. M) Treating RN: Carlene Coria Primary Care Physician: PATIENT, NO Other Clinician: Referring Physician: Fredirick Maudlin Treating Physician/Extender: Skipper Cliche  in Treatment: 10 Education Assessment Education Provided To: Patient Education Topics Provided Infection: Methods: Explain/Verbal Responses: State content correctly Electronic Signature(s) Signed: 08/25/2022 9:44:45 AM By: Carlene Coria RN Entered By: Carlene Coria on 08/24/2022 12:51:19 [name] ([id number]) -------------------------------------------------------------------------------- Wound Assessment Details Patient Name: [name] Date of Service: 08/24/2022 12:30  PM Medical Record Number: [id number] Patient Account Number: [id number] Date of Birth/Sex: [birth year] (56 y.o. M) Treating RN: Carlene Coria Primary Care Ples Trudel: PATIENT, NO Other Clinician: Referring Halford Goetzke: Fredirick Maudlin Treating Rut Betterton/Extender: Skipper Cliche in Treatment: 10 Wound Status Wound Number: 1 Primary Etiology: Diabetic Wound/Ulcer of the Lower Extremity Wound Location: Left, Plantar Foot Wound Status: Open Wounding Event: Gradually Appeared Comorbid History: Type II Diabetes Date Acquired: 12/04/2021 Weeks Of Treatment: 10 Clustered Wound: No Pending Amputation On Presentation Photos Wound Measurements Length: (cm) 9 % Redu Width: (cm) 7.2 % Redu Depth: (cm) 0.2 Epithe Area: (cm) 50.894 [place] Volume: (cm) 10.179 Under ction in Area: 49.4% ction in Volume: 79.7% lialization: None ling: No mining: No Wound Description Classification: Grade 4 Foul Exudate Amount: Medium Sloug Exudate Type: Serosanguineous Exudate Color: red, brown Odor After Cleansing: No h/Fibrino Yes Wound Bed Granulation Amount: Large (67-100%) Exposed Structure Granulation Quality: Pink, Pale Fascia Exposed: No Necrotic Amount: Small (1-33%) Fat Layer (Subcutaneous Tissue) Exposed: Yes Necrotic Quality: Adherent Slough Tendon Exposed: No Muscle Exposed: No Joint Exposed: No Bone Exposed: No Treatment Notes Wound #1 (Foot) Wound Laterality: Plantar, Left Cleanser Dakin 16 (oz) 0.25 Discharge Instruction: Use as directed. Peri-Wound Care IZSAK, MEIR ([id number]) Topical Primary Dressing Gauze Discharge Instruction: moistened with 1/4 Dakins Solution Secondary Dressing ABD Pad 5x9 (in/in) Discharge Instruction: Cover with ABD pad Kerlix 4.5 x 4.1 (in/yd) Discharge Instruction: Apply Kerlix 4.5 x 4.1 (in/yd) as instructed Secured With [place] H Soft Cloth Surgical Tape, 2x2 (in/yd) Compression Wrap Compression Stockings Add-Ons Electronic  Signature(s) Signed: 08/25/2022 9:44:45 AM By: Carlene Coria RN Entered By: Carlene Coria on 08/24/2022 12:42:49 [name] ([id number]) -------------------------------------------------------------------------------- [place] Details Patient Name: [name] Date of Service: 08/24/2022 12:30 PM Medical Record Number: [id number] Patient Account Number: [id number] Date of Birth/Sex: 22-Apr-1966 (56 y.o. M) Treating RN: Carlene Coria Primary Care Kala Gassmann: PATIENT, NO Other Clinician: Referring Erine Phenix: Fredirick Maudlin Treating Azzure Garabedian/Extender: Skipper Cliche in Treatment: 10 Vital Signs Time Taken: 12:38 Temperature (F): 98.3 Height (in): 70 Pulse (bpm): 78 Weight (lbs): 230 Respiratory Rate (breaths/min): 16 Body Mass Index (BMI): 33 Blood Pressure (mmHg): 176/98 Reference Range: 80 - 120 mg / dl Electronic Signature(s) Signed: 08/25/2022 9:44:45 AM By: Carlene Coria RN Entered By: Carlene Coria on 08/24/2022 12:38:45

## 2022-08-28 DIAGNOSIS — S91302A Unspecified open wound, left foot, initial encounter: Secondary | ICD-10-CM | POA: Diagnosis not present

## 2022-08-29 ENCOUNTER — Encounter: Payer: Self-pay | Admitting: Infectious Diseases

## 2022-08-29 ENCOUNTER — Other Ambulatory Visit: Payer: Self-pay | Admitting: Infectious Diseases

## 2022-08-29 ENCOUNTER — Other Ambulatory Visit: Payer: Self-pay

## 2022-08-29 ENCOUNTER — Ambulatory Visit: Payer: 59 | Attending: Infectious Diseases | Admitting: Infectious Diseases

## 2022-08-29 VITALS — BP 134/87 | HR 93 | Temp 97.7°F

## 2022-08-29 DIAGNOSIS — E1169 Type 2 diabetes mellitus with other specified complication: Secondary | ICD-10-CM

## 2022-08-29 DIAGNOSIS — T8189XA Other complications of procedures, not elsewhere classified, initial encounter: Secondary | ICD-10-CM | POA: Insufficient documentation

## 2022-08-29 DIAGNOSIS — G629 Polyneuropathy, unspecified: Secondary | ICD-10-CM | POA: Diagnosis not present

## 2022-08-29 DIAGNOSIS — E1165 Type 2 diabetes mellitus with hyperglycemia: Secondary | ICD-10-CM | POA: Diagnosis not present

## 2022-08-29 DIAGNOSIS — M86172 Other acute osteomyelitis, left ankle and foot: Secondary | ICD-10-CM | POA: Diagnosis not present

## 2022-08-29 DIAGNOSIS — Z7984 Long term (current) use of oral hypoglycemic drugs: Secondary | ICD-10-CM

## 2022-08-29 DIAGNOSIS — X58XXXA Exposure to other specified factors, initial encounter: Secondary | ICD-10-CM | POA: Insufficient documentation

## 2022-08-29 DIAGNOSIS — E1142 Type 2 diabetes mellitus with diabetic polyneuropathy: Secondary | ICD-10-CM | POA: Diagnosis not present

## 2022-08-29 DIAGNOSIS — Z794 Long term (current) use of insulin: Secondary | ICD-10-CM

## 2022-08-29 DIAGNOSIS — E11628 Type 2 diabetes mellitus with other skin complications: Secondary | ICD-10-CM

## 2022-08-29 MED ORDER — CEFADROXIL 500 MG PO CAPS
500.0000 mg | ORAL_CAPSULE | Freq: Two times a day (BID) | ORAL | 1 refills | Status: DC
  Filled 2022-08-29: qty 60, 30d supply, fill #0

## 2022-08-29 MED ORDER — METRONIDAZOLE 500 MG PO TABS
500.0000 mg | ORAL_TABLET | Freq: Two times a day (BID) | ORAL | 1 refills | Status: DC
  Filled 2022-08-29 – 2022-09-11 (×2): qty 60, 30d supply, fill #0

## 2022-08-29 MED ORDER — LEVOFLOXACIN 750 MG PO TABS
750.0000 mg | ORAL_TABLET | Freq: Every day | ORAL | 1 refills | Status: DC
  Filled 2022-08-29: qty 30, 30d supply, fill #0

## 2022-08-29 NOTE — Patient Instructions (Signed)
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 Follow up 2 months Pain management clinic - [address], [place]  - Dr.Dakwa-[phone]

## 2022-08-29 NOTE — Progress Notes (Signed)
error 

## 2022-08-29 NOTE — Progress Notes (Signed)
NAME: [name]  DOB: [birth year]  MRN: [id number]  Date/Time: 08/29/2022 9:15 AM  Subjective:   ?follow up visit For diabetic foot infection -left foot- doing better On triple antibiotics  [name] is a 56 y.o. with a history of Dm,last ba1c in June was 11.2, peripheral neuropathy with left foot wound for the past 9 months followed at wound clinic   On 6/1 MR left foot showed concern for early osteomyelitis along plantar side of 5th metatarsal, extensive gas in the plantar soft tissue  Taken to OR on 6/2(Ortho, Dr. Jeanett Schlein) for left foot debridement and found to have extensive necrotic soft tissue and muscle, tissue sent for Cx.Klebsiella oxytoca, MSSA, strep gordonii, strep cellulitis, Prevotella species beta-lactamase positive. -He refused amputation- He was followed by [facility] infectious disease was sent on 6 weeks of IV . Cefazolin and metronidazole and levaquin. He had a repeat MRI on 06/29/22 and showed Septic arthritis involving the second, third and fourth MTP joints with associated osteomyelitis involving the metatarsals and proximal phalanges.   HE was restarted on Po cefadroxil, levaquin , metronidazole- asked to go for amputation- He came to [facility] wouldn clinic and was started hyperbaric oxygen and came to see me a month ago for 2nd opinion- HE is doing better HE took 9 sessions of HBO and stopped as he could not keep the appts He has no PCP His endocrinologist is Dr. Margarita Rana- [place] Last Hba1c was 11 He is c/o left foot pain-  Past Medical History:  Diagnosis Date   Diabetes mellitus without complication [facility])     Past Surgical History:  Procedure Laterality Date   I & D EXTREMITY Left 05/05/2022   Procedure: LEFT FOOT DEBRIDEMENT;  Surgeon: Newt Minion, MD;  Location: [facility];  Service: Orthopedics;  Laterality: Left;   MYRINGOTOMY WITH TUBE PLACEMENT Bilateral 07/20/2022   Procedure: MYRINGOTOMY WITH TUBE PLACEMENT;  Surgeon: Margaretha Sheffield, MD;  Location: [place];   Service: ENT;  Laterality: Bilateral;  Diabetic   TONSILLECTOMY      Social History   Socioeconomic History   Marital status: Single    Spouse name: Not on file   Number of children: Not on file   Years of education: Not on file   Highest education level: Not on file  Occupational History   Not on file  Tobacco Use   Smoking status: Every Day    Packs/day: 0.25    Years: 25.00    Total pack years: 6.25    Types: Cigarettes   Smokeless tobacco: Never   Tobacco comments:    Cutting back - goes through a pack in 4-6 days (Started around age 63)  77 Use   Vaping Use: Never used  Substance and Sexual Activity   Alcohol use: No   Drug use: No   Sexual activity: Yes  Other Topics Concern   Not on file  Social History Narrative   Not on file   Social Determinants of Health   Financial Resource Strain: Not on file  Food Insecurity: Not on file  Transportation Needs: Not on file  Physical Activity: Not on file  Stress: Not on file  Social Connections: Not on file  Intimate Partner Violence: Not on file    FH Sister, mother, grandmother - DM Brother- colon cancer  No Known Allergies  Current Outpatient Medications  Medication Sig Dispense Refill   acetaminophen (TYLENOL) 500 MG tablet Take 2,000 mg by mouth every 6 (six) hours as needed for mild  pain or moderate pain.     atorvastatin (LIPITOR) 20 MG tablet Take 1 tablet (20 mg total) by mouth daily. 90 tablet 1   cefadroxil (DURICEF) 500 MG capsule take 1 capsule by mouth twice daily 60 capsule 0   ciprofloxacin-dexamethasone (CIPRODEX) OTIC suspension Place 4 drops in both ears twice a day for 5 days 7.5 mL 0   Continuous Blood Gluc Transmit (DEXCOM G6 TRANSMITTER) MISC Use to check blood sugar three times daily. Change transmitter once every 909 days. E11.69 1 each 1   Insulin NPH, Human,, Isophane, (HUMULIN N KWIKPEN) 100 UNIT/ML Kiwkpen Inject 20 Units into the skin 2 (two) times daily. 30 mL 5   Insulin Pen  Needle 32G X 4 MM MISC Use as directed to inject insulin up to 4 times daily. 100 each 0   levofloxacin (LEVAQUIN) 750 MG tablet take 1 tab by mouth daily 30 tablet 0   metFORMIN (GLUCOPHAGE) 500 MG tablet Take 1 tablet (500 mg total) by mouth 2 (two) times daily. 180 tablet 1   methocarbamol (ROBAXIN) 500 MG tablet Take 1 tablet (500 mg total) by mouth every 6 (six) hours as needed for muscle spasms. 30 tablet 0   No current facility-administered medications for this visit.     Abtx:  Anti-infectives (From admission, onward)    None       REVIEW OF SYSTEMS:  Const: negative fever, negative chills, negative weight loss Eyes: negative diplopia or visual changes, negative eye pain ENT: negative coryza, negative sore throat Resp: negative cough, hemoptysis, dyspnea Cards: negative for chest pain, palpitations, lower extremity edema GU: negative for frequency, dysuria and hematuria GI: Negative for abdominal pain, diarrhea, bleeding, constipation Skin: negative for rash and pruritus Heme: negative for easy bruising and gum/nose bleeding ZD:GLOV foot pain Neurolo:negative for headaches, dizziness, vertigo, memory problems  Psych: negative for feelings of anxiety, depression  Endocrine:  diabetes Allergy/Immunology- negative for any medication or food allergies ?  Objective:  VITALS:  BP 134/87   Pulse 93   Temp 97.7 F (36.5 C) (Temporal)   PHYSICAL EXAM:  General: Alert, cooperative, no distress, appears stated age.  Lungs: Clear to auscultation bilaterally. No Wheezing or Rhonchi. No rales. Heart: Regular rate and rhythm, no murmur, rub or gallop. Abdomen: Soft, non-tender,not distended. Bowel sounds normal. No masses Extremities:  08/29/22   08/01/22   07/10/22   07/04/22   06/20/22   05/18/22   05/09/22   05/04/22    Skin: No rashes or lesions. Or bruising Lymph: Cervical, supraclavicular normal. Neurologic: peripheral neuropathy Pertinent  Labs   ? Impression/Recommendation DM poorly controlled with peripheral neuropathy with left foot  wound with underlying osteomyelitis- debridement in June  Was told to get amputation, wanted to save his foot at all cost- so transferred care ot [facility] wound center- started hyperbaric oxygen- and got 9 and then stopped as he could not go there 5 times a week  Wound improving slowly ? Is on cefadroxil, levaquin and flagyl Continue current management Follow up 2 months  he needs to get  a PCP DM-  Hba1c was 11.2- need better control Discussed healthy eating, foot care Foot pain- need to see pain management clinic- information given

## 2022-08-30 ENCOUNTER — Other Ambulatory Visit: Payer: Self-pay

## 2022-08-31 ENCOUNTER — Encounter: Payer: 59 | Admitting: Physician Assistant

## 2022-08-31 ENCOUNTER — Other Ambulatory Visit: Payer: Self-pay

## 2022-08-31 DIAGNOSIS — E11621 Type 2 diabetes mellitus with foot ulcer: Secondary | ICD-10-CM | POA: Diagnosis not present

## 2022-08-31 DIAGNOSIS — E1169 Type 2 diabetes mellitus with other specified complication: Secondary | ICD-10-CM | POA: Diagnosis not present

## 2022-08-31 DIAGNOSIS — L97522 Non-pressure chronic ulcer of other part of left foot with fat layer exposed: Secondary | ICD-10-CM | POA: Diagnosis not present

## 2022-08-31 NOTE — Progress Notes (Addendum)
[name], [name] ([id number]) Visit Report for 08/31/2022 Arrival Information Details Patient Name: [name], [name] Date of Service: 08/31/2022 12:30 PM Medical Record Number: [id number] Patient Account Number: [id number] Date of Birth/Sex: 1966-02-27 (56 y.o. M) Treating RN: Carlene Coria Primary Care Parke Jandreau: PATIENT, NO Other Clinician: Referring Gardiner Espana: Fredirick Maudlin Treating Courtlynn Holloman/Extender: Skipper Cliche in Treatment: 11 Visit Information History Since Last Visit All ordered tests and consults were completed: No Patient Arrived: Knee Scooter Added or deleted any medications: No Arrival Time: 12:34 Any new allergies or adverse reactions: No Accompanied By: self Had a fall or experienced change in No Transfer Assistance: None activities of daily living that may affect Patient Identification Verified: Yes risk of falls: Secondary Verification Process Completed: Yes Signs or symptoms of abuse/neglect since last visito No Patient Requires Transmission-Based Precautions: No Hospitalized since last visit: No Patient Has Alerts: No Implantable device outside of the clinic excluding No cellular tissue based products placed in the center since last visit: Has Dressing in Place as Prescribed: Yes Pain Present Now: No Electronic Signature(s) Signed: 08/31/2022 12:48:30 PM By: Carlene Coria RN Entered By: Carlene Coria on 08/31/2022 12:37:31 [name] ([id number]) -------------------------------------------------------------------------------- Clinic Level of Care Assessment Details Patient Name: [name], [name] Date of Service: 08/31/2022 12:30 PM Medical Record Number: [id number] Patient Account Number: [id number] Date of Birth/Sex: 1966/08/02 (56 y.o. M) Treating RN: Carlene Coria Primary Care Remedios Mckone: PATIENT, NO Other Clinician: Referring Denisse Whitenack: Fredirick Maudlin Treating Leith Hedlund/Extender: Skipper Cliche in Treatment: 11 Clinic Level of Care Assessment Items TOOL 1 Quantity  Score _0  - Use when EandM and Procedure is performed on INITIAL visit 0 ASSESSMENTS - Nursing Assessment / Reassessment _1  - General Physical Exam (combine w/ comprehensive assessment (listed just below) when performed on new 0 pt. evals) _2  - 0 Comprehensive Assessment (HX, ROS, Risk Assessments, Wounds Hx, etc.) ASSESSMENTS - Wound and Skin Assessment / Reassessment _3  - Dermatologic / Skin Assessment (not related to wound area) 0 ASSESSMENTS - Ostomy and/or Continence Assessment and Care _4  - Incontinence Assessment and Management 0 _5  - 0 Ostomy Care Assessment and Management (repouching, etc.) PROCESS - Coordination of Care _6  - Simple Patient / Family Education for ongoing care 0 _7  - 0 Complex (extensive) Patient / Family Education for ongoing care _8  - 0 Staff obtains Programmer, systems, Records, Test Results / Process Orders _9  - 0 Staff telephones HHA, Nursing Homes / Clarify orders / etc _10  - 0 Routine Transfer to another Facility (non-emergent condition) _11  - 0 Routine Hospital Admission (non-emergent condition) _12  - 0 New Admissions / Biomedical engineer / Ordering NPWT, Apligraf, etc. _13  - 0 Emergency Hospital Admission (emergent condition) PROCESS - Special Needs _14  - Pediatric / Minor Patient Management 0 _15  - 0 Isolation Patient Management _16  - 0 Hearing / Language / Visual special needs _17  - 0 Assessment of Community assistance (transportation, D/C planning, etc.) _18  - 0 Additional assistance / Altered mentation _19  - 0 Support Surface(s) Assessment (bed, cushion, seat, etc.) INTERVENTIONS - Miscellaneous _20  - External ear exam 0 _21  - 0 Patient Transfer (multiple staff / Civil Service fast streamer / Similar devices) _22  - 0 Simple Staple / Suture removal (25 or less) _23  - 0 Complex Staple / Suture removal (26 or more) _24  - 0 Hypo/Hyperglycemic Management (do not check if billed separately) _25  - 0 Ankle / Brachial Index (ABI) - do not check if billed separately Has the  patient been seen at the hospital within the last three years: Yes Total Score: 0 Level Of Care: ____  [name], [name] ([id number]) Electronic Signature(s) Signed: 09/01/2022 10:43:45 AM By: Carlene Coria RN Entered By: Carlene Coria on 08/31/2022 12:53:20 [name], [name] ([id number]) -------------------------------------------------------------------------------- Encounter Discharge Information Details Patient Name: [name] Date of Service: 08/31/2022 12:30 PM Medical Record Number: [id number] Patient Account Number: [id number] Date of Birth/Sex: [birth year] (56 y.o. M) Treating RN: Carlene Coria Primary Care Kamarrion Stfort: PATIENT, NO Other Clinician: Referring Sherlyne Crownover: Fredirick Maudlin Treating Aviv Lengacher/Extender: Skipper Cliche in Treatment: 11 Encounter Discharge Information Items Post Procedure Vitals Discharge Condition: Stable Temperature (F): 98 Ambulatory Status: Knee Scooter Pulse (bpm): 82 Discharge Destination: Home Respiratory Rate (breaths/min): 18 Transportation: Private Auto Blood Pressure (mmHg): 133/77 Accompanied By: self Schedule Follow-up Appointment: Yes Clinical Summary of Care: Electronic Signature(s) Signed: 09/01/2022 10:43:45 AM By: Carlene Coria RN Entered By: Carlene Coria on 08/31/2022 12:54:23 [name] ([id number]) -------------------------------------------------------------------------------- Lower Extremity Assessment Details Patient Name: [name] Date of Service: 08/31/2022 12:30 PM Medical Record Number: [id number] Patient Account Number: [id number] Date of Birth/Sex: 25-Feb-1966 (56 y.o. M) Treating RN: Carlene Coria Primary Care Makai Agostinelli: PATIENT, NO Other Clinician: Referring Josilynn Losh: Fredirick Maudlin Treating Letia Guidry/Extender: Skipper Cliche in Treatment: 11 Edema Assessment Assessed: [Left: No] [Right: No] Edema: [Left: Ye] [Right: s] Calf Left: Right: Point of Measurement: 32 cm From Medial Instep 41 cm Ankle Left: Right: Point of  Measurement: 11 cm From Medial Instep 26 cm Vascular Assessment Pulses: Dorsalis Pedis Palpable: [Left:Yes] Electronic Signature(s) Signed: 08/31/2022 12:48:30 PM By: Carlene Coria RN Entered By: Carlene Coria on 08/31/2022 12:42:06 [name] ([id number]) -------------------------------------------------------------------------------- Multi Wound Chart Details Patient Name: [name] Date of Service: 08/31/2022 12:30 PM Medical Record Number: [id number] Patient Account Number: [id number] Date of Birth/Sex: 1966/07/10 (56 y.o. M) Treating RN: Carlene Coria Primary Care Amardeep Beckers: PATIENT, NO Other Clinician: Referring Kiesha Ensey: Fredirick Maudlin Treating Meyah Corle/Extender: Skipper Cliche in Treatment: 11 Vital Signs Height(in): 70 Pulse(bpm): 49 Weight(lbs): 230 Blood Pressure(mmHg): 133/77 Body Mass Index(BMI): 33 Temperature(F): 98 Respiratory Rate(breaths/min): 18 Photos: [N/A:N/A] Wound Location: Left, Plantar Foot N/A N/A Wounding Event: Gradually Appeared N/A N/A Primary Etiology: Diabetic Wound/Ulcer of the Lower N/A N/A Extremity Comorbid History: Type II Diabetes N/A N/A Date Acquired: 12/04/2021 N/A N/A Weeks of Treatment: 11 N/A N/A Wound Status: Open N/A N/A Wound Recurrence: No N/A N/A Pending Amputation on Yes N/A N/A Presentation: Measurements L x W x D (cm) 9x7.2x0.2 N/A N/A Area (cm) : 50.894 N/A N/A Volume (cm) : 10.179 N/A N/A % Reduction in Area: 49.40% N/A N/A % Reduction in Volume: 79.70% N/A N/A Classification: Grade 4 N/A N/A Exudate Amount: Medium N/A N/A Exudate Type: Serosanguineous N/A N/A Exudate Color: red, brown N/A N/A Granulation Amount: Large (67-100%) N/A N/A Granulation Quality: Pink, Pale N/A N/A Necrotic Amount: Small (1-33%) N/A N/A Exposed Structures: Fat Layer (Subcutaneous Tissue): N/A N/A Yes Fascia: No Tendon: No Muscle: No Joint: No Bone: No Epithelialization: None N/A N/A Treatment Notes Electronic  Signature(s) Signed: 08/31/2022 12:48:30 PM By: Carlene Coria RN Entered By: Carlene Coria on 08/31/2022 12:43:16 [name] ([id number]) -------------------------------------------------------------------------------- Multi-Disciplinary Care Plan Details Patient Name: [name] Date of Service: 08/31/2022 12:30 PM Medical Record Number: [id number] Patient Account Number: [id number] Date of Birth/Sex: August 30, 1966 (56 y.o. M) Treating RN: Carlene Coria Primary Care Sanaya Gwilliam: PATIENT, NO Other Clinician: Referring Kasiah Manka: Fredirick Maudlin Treating Esther Bradstreet/Extender: Skipper Cliche in Treatment: 11 Active Inactive Osteomyelitis Nursing Diagnoses: Infection: osteomyelitis Knowledge deficit related to disease process and management Potential for infection: osteomyelitis Goals: Diagnostic evaluation for osteomyelitis completed as ordered Date Initiated: 06/28/2022 Date Inactivated: 08/03/2022  Target Resolution Date: 06/28/2022 Goal Status: Met Patient/caregiver will verbalize understanding of disease process and disease management Date Initiated: 06/28/2022 Date Inactivated: 08/31/2022 Target Resolution Date: 06/28/2022 Goal Status: Met Patient's osteomyelitis will resolve Date Initiated: 06/28/2022 Target Resolution Date: 06/28/2022 Goal Status: Active Signs and symptoms for osteomyelitis will be recognized and promptly addressed Date Initiated: 06/28/2022 Date Inactivated: 08/03/2022 Target Resolution Date: 06/28/2022 Goal Status: Met Interventions: Assess for signs and symptoms of osteomyelitis resolution every visit Provide education on osteomyelitis Screen for HBO Notes: Wound/Skin Impairment Nursing Diagnoses: Knowledge deficit related to ulceration/compromised skin integrity Goals: Patient/caregiver will verbalize understanding of skin care regimen Date Initiated: 06/15/2022 Date Inactivated: 08/03/2022 Target Resolution Date: 07/16/2022 Goal Status: Met Ulcer/skin breakdown  will have a volume reduction of 30% by week 4 Date Initiated: 06/15/2022 Date Inactivated: 08/03/2022 Target Resolution Date: 07/16/2022 Goal Status: Unmet Unmet Reason: comorbities Ulcer/skin breakdown will have a volume reduction of 50% by week 8 Date Initiated: 06/15/2022 Date Inactivated: 08/31/2022 Target Resolution Date: 08/16/2022 Goal Status: Unmet Unmet Reason: comorbdities Ulcer/skin breakdown will have a volume reduction of 80% by week 12 Date Initiated: 06/15/2022 Target Resolution Date: 09/15/2022 Goal Status: Active Ulcer/skin breakdown will heal within 14 weeks Date Initiated: 06/15/2022 Target Resolution Date: 10/16/2022 Goal Status: Active Interventions: Assess patient/caregiver ability to obtain necessary supplies Assess patient/caregiver ability to perform ulcer/skin care regimen upon admission and as needed [name], [name] ([id number]) Assess ulceration(s) every visit Notes: Electronic Signature(s) Signed: 08/31/2022 12:48:30 PM By: Carlene Coria RN Entered By: Carlene Coria on 08/31/2022 12:43:09 [name] ([id number]) -------------------------------------------------------------------------------- Pain Assessment Details Patient Name: [name] Date of Service: 08/31/2022 12:30 PM Medical Record Number: [id number] Patient Account Number: [id number] Date of Birth/Sex: 12-20-1965 (56 y.o. M) Treating RN: Carlene Coria Primary Care Broden Holt: PATIENT, NO Other Clinician: Referring Alyra Patty: Fredirick Maudlin Treating Takiah Maiden/Extender: Skipper Cliche in Treatment: 11 Active Problems Location of Pain Severity and Description of Pain Patient Has Paino No Site Locations Pain Management and Medication Current Pain Management: Electronic Signature(s) Signed: 08/31/2022 12:48:30 PM By: Carlene Coria RN Entered By: Carlene Coria on 08/31/2022 12:37:54 [name] ([id number]) -------------------------------------------------------------------------------- Patient/Caregiver  Education Details Patient Name: [name] Date of Service: 08/31/2022 12:30 PM Medical Record Number: [id number] Patient Account Number: [id number] Date of Birth/Gender: 1966-04-22 (56 y.o. M) Treating RN: Carlene Coria Primary Care Physician: PATIENT, NO Other Clinician: Referring Physician: Fredirick Maudlin Treating Physician/Extender: Skipper Cliche in Treatment: 11 Education Assessment Education Provided To: Patient Education Topics Provided Infection: Methods: Explain/Verbal Responses: State content correctly Electronic Signature(s) Signed: 09/01/2022 10:43:45 AM By: Carlene Coria RN Entered By: Carlene Coria on 08/31/2022 12:53:38 [name] ([id number]) -------------------------------------------------------------------------------- Wound Assessment Details Patient Name: [name] Date of Service: 08/31/2022 12:30 PM Medical Record Number: [id number] Patient Account Number: [id number] Date of Birth/Sex: 1966-05-14 (56 y.o. M) Treating RN: Carlene Coria Primary Care Ociel Retherford: PATIENT, NO Other Clinician: Referring Domonique Brouillard: Fredirick Maudlin Treating Shadi Larner/Extender: Skipper Cliche in Treatment: 11 Wound Status Wound Number: 1 Primary Etiology: Diabetic Wound/Ulcer of the Lower Extremity Wound Location: Left, Plantar Foot Wound Status: Open Wounding Event: Gradually Appeared Comorbid History: Type II Diabetes Date Acquired: 12/04/2021 Weeks Of Treatment: 11 Clustered Wound: No Pending Amputation On Presentation Photos Wound Measurements Length: (cm) 9 % Redu Width: (cm) 7.2 % Redu Depth: (cm) 0.2 Epithe Area: (cm) 50.894 Tunne Volume: (cm) 10.179 Under ction in Area: 49.4% ction in Volume: 79.7% lialization: None ling: No mining: No Wound Description Classification: Grade 4 Foul Exudate Amount: Medium Sloug Exudate Type: Serosanguineous Exudate Color: red,  brown Odor After Cleansing: No h/Fibrino Yes Wound Bed Granulation Amount: Large (67-100%)  Exposed Structure Granulation Quality: Pink, Pale Fascia Exposed: No Necrotic Amount: Small (1-33%) Fat Layer (Subcutaneous Tissue) Exposed: Yes Necrotic Quality: Adherent Slough Tendon Exposed: No Muscle Exposed: No Joint Exposed: No Bone Exposed: No Treatment Notes Wound #1 (Foot) Wound Laterality: Plantar, Left Cleanser Dakin 16 (oz) 0.25 Discharge Instruction: Use as directed. Peri-Wound Care [name], [name] ([id number]) Topical Primary Dressing Gauze Discharge Instruction: moistened with 1/4 Dakins Solution Secondary Dressing ABD Pad 5x9 (in/in) Discharge Instruction: Cover with ABD pad Kerlix 4.5 x 4.1 (in/yd) Discharge Instruction: Apply Kerlix 4.5 x 4.1 (in/yd) as instructed Secured With [name] Surgical Tape, 2x2 (in/yd) Compression Wrap Compression Stockings Add-Ons Electronic Signature(s) Signed: 08/31/2022 12:48:30 PM By: Carlene Coria RN Entered By: Carlene Coria on 08/31/2022 [name], [name] ([id number]) -------------------------------------------------------------------------------- Vitals Details Patient Name: [name] Date of Service: 08/31/2022 12:30 PM Medical Record Number: [id number] Patient Account Number: [id number] Date of Birth/Sex: 07/27/1966 (56 y.o. M) Treating RN: Carlene Coria Primary Care Taylin Mans: PATIENT, NO Other Clinician: Referring Nyala Kirchner: Fredirick Maudlin Treating Geovonni Meyerhoff/Extender: Skipper Cliche in Treatment: 11 Vital Signs Time Taken: 12:37 Temperature (F): 98 Height (in): 70 Pulse (bpm): 82 Weight (lbs): 230 Respiratory Rate (breaths/min): 18 Body Mass Index (BMI): 33 Blood Pressure (mmHg): 133/77 Reference Range: 80 - 120 mg / dl Electronic Signature(s) Signed: 08/31/2022 12:48:30 PM By: Carlene Coria RN Entered By: Carlene Coria on 08/31/2022 12:37:49

## 2022-09-01 NOTE — Progress Notes (Signed)
SOPHIE, QUILES ([id number]) Visit Report for 08/31/2022 Chief Complaint Document Details Patient Name: [name], [name] Date of Service: 08/31/2022 12:30 PM Medical Record Number: [id number] Patient Account Number: [id number] Date of Birth/Sex: 18-Jun-1966 (56 y.o. 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 M) Treating RN: Yevonne Pax Primary Care Provider: PATIENT, NO Other Clinician: Referring Provider: Duanne Guess Treating Provider/Extender: Rowan Blase in Treatment: 11 Debridement Performed for Wound #1 Left,Plantar Foot Assessment: Performed By: Physician Nelida Meuse., PA-C Debridement Type: Debridement Severity of Tissue Pre Debridement: Fat layer exposed Level of Consciousness (Pre- Awake and Alert procedure): Pre-procedure Verification/Time Out Yes - 12:52 Taken: Start Time: 12:52 Total Area Debrided (L x W): 5 (cm) x 3 (cm) = 15 (cm) Tissue and other material Viable, Non-Viable, Callus, Slough, Subcutaneous, Skin: Dermis , Skin: Epidermis, Biofilm, Slough debrided: Level: Skin/Subcutaneous Tissue Debridement Description: Excisional Instrument: Curette Bleeding: Moderate Hemostasis Achieved: Pressure End Time: 12:55 Procedural Pain: 0 Post Procedural Pain: 0 Response to Treatment:  Procedure was tolerated well Level of Consciousness (Post- Awake and Alert procedure): Post Debridement Measurements of Total Wound Length: (cm) 9 Width: (cm) 7.2 Depth: (cm) 0.2 Volume: (cm) 10.179 Character of Wound/Ulcer Post Debridement: Improved Severity of Tissue Post Debridement: Fat layer exposed Post Procedure Diagnosis Same as Pre-procedure Electronic Signature(s) Signed: 08/31/2022 3:32:36 PM By: Lenda Kelp PA-C Signed: 09/01/2022 10:43:45 AM By: Yevonne Pax RN Entered By: Yevonne Pax on 08/31/2022 12:53:11 [name] ([id number]) -------------------------------------------------------------------------------- HPI Details Patient Name: [name] Date of Service: 08/31/2022 12:30 PM Medical Record Number: [id number] Patient Account Number: [id number] Date of Birth/Sex: Feb 12, 1966 (56 y.o. M) Treating RN: Yevonne Pax Primary Care Provider: PATIENT, NO Other Clinician: Referring Provider: Duanne Guess Treating Provider/Extender: Rowan Blase in Treatment: 11 History of Present Illness HPI Description: Notes from [facility] under Dr. America Brown care:   ADMISSION6/15/2023This is a 56 year old poorly controlled type II diabetic (last A1c 11.5%). In January of this year, he presented to the hospital with an abscess in his left foot. He also had a foreign body present. It was removed in the ER and he was admitted to the hospital for IV antibiotics. He was subsequently discharged on oral antibiotics. Due to financial concerns, he has not taken his diabetic medications (metformin and 70/30 insulin) and as a result, presented back to the emergency room on June 1 with worsening findings, including frank pus and osteomyelitis on MRI. BKA was recommended, but he declined. He was taken to the operating room by Dr. Lajoyce Corners who performed an extensive debridement. Cultures were taken and he was placed on IV antibiotics. He saw infectious disease while in the  hospital who prescribed a 6-week course of IV antibiotics including cefazolin as well as oral metronidazole and levofloxacin. The infectious disease provider also indicated that he would benefit from below-knee amputation, but the patient desired another opinion and therefore he has been referred to wound care center for further evaluation and management. ABIs obtained while he was in the hospital demonstrate adequate blood flow. Essentially the  entire bottom of the patient's left foot has been removed. The heads of the majority of his metatarsal bones are exposed along with their accompanying tendons. Muscle and fat are exposed as well. The wound surface is fairly dry; the patient reports that he was not given any specific wound care instructions other than to place a dry dressing on it after showering. There is no odor or purulent drainage identified. 05/26/2022: The wound measures a little bit smaller today. There is some good granulation tissue beginning to form on the surface. He still has exposed bone and tendon. There is some slough accumulation. He saw infectious disease earlier this week and was frustrated that they recommended amputation. He is interested in another surgical opinion. He continues to smoke but says that he has cut down. He is using a knee scooter for mobility so that he can stay off of his foot. 06/05/2022: He saw Dr. Lajoyce Corners last week who was supportive of our efforts to try to salvage the foot. I referred him to vascular surgery to see Dr. Chestine Spore but he has not received an appointment yet. He continues on IV antibiotics. He says that after our conversation last week about him being unable to initiate hyperbarics if he was going to continue to smoke, he has not had a cigarette since. He is now at 4 weeks of conventional management and his insurance was initiated on July 1. Today, the wound continues to show evidence of improvement. There is good granulation tissue forming. The  metatarsals are still exposed but actually have some pink periosteum. There is still some slough and fibrinous exudate present. No odor or purulent drainage. We have just been using Dakin's dressing changes for now. Admission to Wound Care Center at [facility]: 06-15-2022 upon evaluation today patient presents for initial inspection here in our clinic though he has been seen by Dr. Lady Gary in [place] up to this point. I did review her notes and records as well they have been using Dakin's moistened gauze dressing which has done a great job at helping to clean out the bottom of his foot. Please see the discourse above for additional information in regard to treatment course up until the patient arrives here in the [place] clinic today. The patient is ready to get started with HBO therapy as soon as possible I do think based on all my review of his records this seems to be appropriate he does have chronic osteomyelitis is also been offered amputation this is obviously a limb salvage operation here. We are trying to prevent him from having to undergo an extensive amputation which would likely be a below-knee amputation which in turn is going to affect his quality of life he wants to try to avoid that if at all possible I think hyperbaric oxygen therapy is his best bet to achieve that goal. Currently he has been on IV Ancef. That actually ends tomorrow. Following that according to notes it appears he supposed to be taking Levaquin and Flagyl for an additional period of time although I am not certain exactly how long that with the. He has been seeing regional physicians infectious disease in [place] and again that so I recommended that he contact today in order to find out what the plan is going forward. His most recent hemoglobin A1c as noted above was 11.5 on 05-07-2022 he tells me trying to work on that he is also quit smoking as of this point. 06-22-2022 upon evaluation today patient appears to be doing  well currently in regard to his wound all things considered. He is showing signs of a lot of new granulation tissue and overall very pleased. He is changing this once a day with the Dakin's moistened gauze dressing. Fortunately I do not see any evidence of infection locally or systemically at this time. We have been trying to get him into the hyperbaric chamber he was having a lot of issues with sinus pressure and we have made referral and called multiple ENT specialist the earliest we have been able to get his August 9 at [facility] ear nose throat. They are going to try to work him in sooner if they have any opportunities to do so. Obviously I think the sooner he can do this the better. We need to get him in hyperbarics as quickly as possible. 7/26; patient presents for follow-up. He has been using Dakin's wet-to-dry dressings and oil emollient dressing over the exposed bone. He currently denies systemic signs of infection. He states he is scheduled to see infectious disease, Dr Thedore Mins next week. She had ordered an MRI of the left foot and he is scheduled to have this done tomorrow. He currently denies systemic signs of infection Including fever/chills, nausea/vomiting increased warmth or erythema to the wound bed or purulent drainage. He started hyperbaric oxygen treatment however did not tolerate this due to sinus pressure. He is scheduled to see ENT in 2 weeks for evaluation. He is not continuing HBO until he is cleared by ENT. 07-06-2022 patient presents today for follow-up concerning his plantar foot ulcer. The good news is this actually is significantly better compared to previous. Some of the bone which is necrotic is starting to slough off and I am going to perform debridement to clear this away today. Nonetheless I do think that the patient is going to need hyperbarics and we need to try to get it as soon as possible. He still having a lot of sinus pressure I can even hear the congestion today.  He is on 3 different antibiotics cefadroxil, metronidazole, and Levaquin currently for the osteomyelitis. For that reason this should actually help with the infection if he does have a chronic sinusitis I am also going to see about getting him on steroids to try to see if this can benefit him as well. He voiced understanding and he is in agreement with giving that shot. This is probably can to spike his blood sugars which I discussed with him he is going to be seeing his primary care provider later today I want him to let her know DELROSSI, Jayin ([id number]) as well what is going on and why so that she could be aware but I really do think he needs to take the prednisone. We need to get him in the chamber as soon as possible to try to help with getting this wound to heal and closed so that he does not lose his foot. 07-13-2022 upon evaluation today patient appears to be doing better in regard to his wound he continues to show signs of new granulation am going to perform some debridement today but overall this is doing quite well. 07-20-2022 upon evaluation today patient's wound is actually showing some signs of improvement he does have 1 area on the fifth metatarsal region where there is some necrotic tendon that is noted at this point. Unfortunately this is preventing good granulation and over this point the rest of the metatarsal region is completely covered over with granulation tissue which is great news there  is some other slough and biofilm buildup that I will clearway as well. 07-27-2022 upon evaluation today patient appears to be doing well currently in regard to his plantar foot ulcer. He has been tolerating the dressing changes without complication fortunately. I do believe he is making excellent progress and each time I see him this is better and better is pretty much completely covered as far as any bone exposure is concerned and he still is on the IV antibiotics were still also undergoing  hyperbarics at this point that he just got started and is doing quite well with since getting the tubes in his ears.Marland Kitchen 08-03-2022 upon evaluation today patient appears to be doing well in general although has been having a lot of drainage compared to normal. It was actually leaking through his dressing today. He tells me he changed it last night. With that being said he has been up on his feet a lot more which I think is probably a big part of the issue here. Fortunately I do not see any signs of infection locally or systemically. 08-17-2022 upon evaluation today patient is doing well with regard to his wound this is actually measuring smaller which is good news. I am very pleased in that regard. With that being said he has not been participating HBO therapy as he really needs to afford to see this heal appropriately and completely. In fact he has only made 9 total HBO treatments in the past 4 weeks. Again this is not therapeutic and I discussed that with the patient. Nonetheless he is still improving and since the hyperbarics is not helping based on the discussion today the patient is going to discontinue HBO therapy at this point. He tells me that getting here 5 days a week just is not possible. He has had difficulty getting here and then also when he was here had difficulty with getting here at the right time which has also been part of the problem. Nonetheless either way I am pleased that he is doing better and my hope is that we will still be able to get him healed obviously he should continue with the antibiotics as well as the wound care as he has been doing this seems to be doing excellent for him but everything is improving quite nicely. 08-24-2022 upon evaluation today patient appears to be doing well currently in regard to his wound this is actually measuring significantly better even compared to last week. I am very pleased in that regard I do not see any signs of infection and I think he is  making excellent progress here. 08-31-2022 upon evaluation today patient's wound is actually showing signs of significant improvement. I am actually very pleased with where things stand currently. There does not appear to be any evidence of active infection locally or systemically at this time. No fevers, chills, nausea, vomiting, or diarrhea. Electronic Signature(s) Signed: 08/31/2022 2:05:30 PM By: Lenda Kelp PA-C Entered By: Lenda Kelp on 08/31/2022 14:05:30 [name] ([id number]) -------------------------------------------------------------------------------- Physical Exam Details Patient Name: [name] Date of Service: 08/31/2022 12:30 PM Medical Record Number: [id number] Patient Account Number: [id number] Date of Birth/Sex: 1966/04/25 (56 y.o. M) Treating RN: Yevonne Pax Primary Care Provider: PATIENT, NO Other Clinician: Referring Provider: Duanne Guess Treating Provider/Extender: Rowan Blase in Treatment: 11 Constitutional Well-nourished and well-hydrated in no acute distress. Respiratory normal breathing without difficulty. Psychiatric this patient is able to make decisions and demonstrates good insight into disease process. Alert and Oriented x 3.  pleasant and cooperative. Notes Upon inspection patient's wound again did require sharp debridement clearway some of the necrotic debris he tolerated that debridement today without complication and postdebridement the wound bed appears to be doing much better which is also news. Electronic Signature(s) Signed: 08/31/2022 2:05:45 PM By: Lenda KelpStone III, Graeson Nouri PA-C Entered By: Lenda KelpStone III, Canon Gola on 08/31/2022 14:05:45 Merrilee JanskyMARTIN, Wisdom ([id number]) -------------------------------------------------------------------------------- Physician Orders Details Patient Name: Merrilee JanskyMARTIN, Gwen Date of Service: 08/31/2022 12:30 PM Medical Record Number: [id number] Patient Account Number: [id number] Date of Birth/Sex: [birth year] (56 y.o.  M) Treating RN: Yevonne PaxEpps, Carrie Primary Care Provider: PATIENT, NO Other Clinician: Referring Provider: Duanne Guessannon, Jennifer Treating Provider/Extender: Rowan BlaseStone, Aundreya Souffrant Weeks in Treatment: 11 Verbal / Phone Orders: No Diagnosis Coding Follow-up Appointments o Return Appointment in 1 week. Bathing/ Shower/ Hygiene o May shower; gently cleanse wound with antibacterial soap, rinse and pat dry prior to dressing wounds Anesthetic (Use 'Patient Medications' Section for Anesthetic Order Entry) o Lidocaine applied to wound bed Edema Control - Lymphedema / Segmental Compressive Device / Other o Elevate, Exercise Daily and Avoid Standing for Long Periods of Time. o Elevate legs to the level of the heart and pump ankles as often as possible o Elevate leg(s) parallel to the floor when sitting. Wound Treatment Wound #1 - Foot Wound Laterality: Plantar, Left Cleanser: Dakin 16 (oz) 0.25 1 x Per Day/30 Days Discharge Instructions: Use as directed. Primary Dressing: Gauze (Generic) 1 x Per Day/30 Days Discharge Instructions: moistened with 1/4 Dakins Solution Secondary Dressing: ABD Pad 5x9 (in/in) (Generic) 1 x Per Day/30 Days Discharge Instructions: Cover with ABD pad Secondary Dressing: Kerlix 4.5 x 4.1 (in/yd) (Generic) 1 x Per Day/30 Days Discharge Instructions: Apply Kerlix 4.5 x 4.1 (in/yd) as instructed Secured With: Medipore Tape - 68M Medipore H Soft Cloth Surgical Tape, 2x2 (in/yd) (Generic) 1 x Per Day/30 Days Electronic Signature(s) Signed: 08/31/2022 3:32:36 PM By: Lenda KelpStone III, Ethne Jeon PA-C Signed: 09/01/2022 10:43:45 AM By: Yevonne PaxEpps, Carrie RN Entered By: Yevonne PaxEpps, Carrie on 08/31/2022 12:50:29 Merrilee JanskyMARTIN, Alferd ([id number]) -------------------------------------------------------------------------------- Problem List Details Patient Name: Merrilee JanskyMARTIN, Kooper Date of Service: 08/31/2022 12:30 PM Medical Record Number: [id number] Patient Account Number: [id number] Date of Birth/Sex: [birth year] (56 y.o.  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 America Brownannon's care:   ADMISSION6/15/2023This is a 56 year old poorly controlled type II diabetic (last A1c 11.5%). In January of this year, he presented to the hospital with an abscess in his left foot. He also had a foreign body present. It was removed in the ER  and he was admitted to the hospital for IV antibiotics. He was subsequently discharged on oral antibiotics. Due to financial concerns, he has not taken his diabetic medications (metformin and 70/30 insulin) and as a result, presented back to the emergency room on June 1 with worsening  findings, including frank pus and osteomyelitis on MRI. BKA was recommended, but he declined. He was taken to the operating room by Dr. Lajoyce Corners who performed an extensive debridement. Cultures were taken and he was placed on IV antibiotics. He saw infectious disease while in the hospital who prescribed a 6-week course of IV antibiotics including cefazolin as well as oral metronidazole and levofloxacin. The infectious disease provider also indicated that he would benefit from below-knee amputation, but the patient desired another opinion and therefore he has been referred to wound care center for further evaluation and management. ABIs obtained while he was in the hospital demonstrate adequate blood flow. Essentially the entire bottom of the patient's left foot has been removed. The heads of the majority of his metatarsal bones are exposed along with their accompanying tendons. Muscle and fat are exposed as well. The wound surface is fairly dry; the patient reports that he was not given any specific wound care instructions other than to place a dry dressing on it after showering. There is no odor or purulent drainage identified. 05/26/2022: The wound measures a little bit smaller today. There is some good granulation tissue beginning to form on the surface. He still has exposed bone and tendon. There is some slough accumulation. He saw infectious disease earlier this week and was frustrated that they recommended amputation. He is interested in another surgical opinion. He continues to smoke but says that he has cut down. He is using a knee scooter for mobility so that he can stay off of his foot. 06/05/2022: He saw Dr. Lajoyce Corners last week who was supportive of our efforts to try to salvage the foot. I referred him to vascular surgery to see Dr. Chestine Spore but he has not received an appointment yet. He continues on IV antibiotics. He says that after our conversation last week about him being unable to initiate  hyperbarics if he was going to continue to smoke, he has not had a cigarette since. He is now at 4 weeks of conventional management and his insurance was initiated on July 1. Today, the wound continues to show evidence of improvement. There is good granulation tissue forming. The metatarsals are still exposed but actually have some pink periosteum. There is still some slough and fibrinous exudate present. No odor or purulent drainage. We have just been using Dakin's dressing changes for now. Admission to Wound Care Center at [facility]: 06-15-2022 upon evaluation today patient presents for initial inspection here in our clinic though he has been seen by Dr. Lady Gary in [place] up to this point. I did review her notes and records as well they have been using Dakin's moistened gauze dressing which has done a great job at helping to clean out the bottom of his foot. Please see the discourse above for additional information in regard to treatment course up until the patient arrives here in the [place] clinic today. The patient is ready to get started with HBO therapy as soon as possible I do think based on all my review of his records this seems to be appropriate he does have chronic osteomyelitis is also been offered amputation this is obviously a limb salvage operation here. We are trying to prevent him from having  to undergo an extensive amputation which would likely be a below-knee amputation which in turn is going to affect his quality of life he wants to try to avoid that if at all possible I think hyperbaric oxygen therapy is his best bet to achieve that goal. Currently he has been on IV Ancef. That actually ends tomorrow. Following that according to notes it appears he supposed to be taking Levaquin and Flagyl for an additional period of time although I am not certain exactly how long that with the. He has been seeing regional physicians infectious disease in [place] and again that so I recommended  that he contact today in order to find out what the plan is going forward. His most recent hemoglobin A1c as noted above was 11.5 on 05-07-2022 he tells me trying to work on that he is also quit smoking as of this point. 06-22-2022 upon evaluation today patient appears to be doing well currently in regard to his wound all things considered. He is showing signs of a lot of new granulation tissue and overall very pleased. He is changing this once a day with the Dakin's moistened gauze dressing. Fortunately I do not see any evidence of infection locally or systemically at this time. We have been trying to get him into the hyperbaric chamber he was having a lot of issues with sinus pressure and we have made referral and called multiple ENT specialist the earliest we have been able to get his August 9 at [facility] ear nose throat. They are going to try to work him in sooner if they have any opportunities to do so. Obviously I think the sooner he can do this the better. We need to get him in hyperbarics as quickly as possible. 7/26; patient presents for follow-up. He has been using Dakin's wet-to-dry dressings and oil emollient dressing over the exposed bone. He currently denies systemic signs of infection. He states he is scheduled to see infectious disease, Dr Thedore Mins next week. She had ordered an MRI of the left foot and he is scheduled to have this done tomorrow. He currently denies systemic signs of infection Including fever/chills, nausea/vomiting increased warmth or erythema to the wound bed or purulent drainage. He started hyperbaric oxygen treatment however did not tolerate this due to sinus pressure. He is scheduled to see ENT in 2 weeks for evaluation. He is not continuing HBO until he is cleared by ENT. 07-06-2022 patient presents today for follow-up concerning his plantar foot ulcer. The good news is this actually is significantly better compared to previous. Some of the bone which is necrotic is  starting to slough off and I am going to perform debridement to clear this away today. ARMEL, RABBANI ([id number]) Nonetheless I do think that the patient is going to need hyperbarics and we need to try to get it as soon as possible. He still having a lot of sinus pressure I can even hear the congestion today. He is on 3 different antibiotics cefadroxil, metronidazole, and Levaquin currently for the osteomyelitis. For that reason this should actually help with the infection if he does have a chronic sinusitis I am also going to see about getting him on steroids to try to see if this can benefit him as well. He voiced understanding and he is in agreement with giving that shot. This is probably can to spike his blood sugars which I discussed with him he is going to be seeing his primary care provider later today I want him to  let her know as well what is going on and why so that she could be aware but I really do think he needs to take the prednisone. We need to get him in the chamber as soon as possible to try to help with getting this wound to heal and closed so that he does not lose his foot. 07-13-2022 upon evaluation today patient appears to be doing better in regard to his wound he continues to show signs of new granulation am going to perform some debridement today but overall this is doing quite well. 07-20-2022 upon evaluation today patient's wound is actually showing some signs of improvement he does have 1 area on the fifth metatarsal region where there is some necrotic tendon that is noted at this point. Unfortunately this is preventing good granulation and over this point the rest of the metatarsal region is completely covered over with granulation tissue which is great news there is some other slough and biofilm buildup that I will clearway as well. 07-27-2022 upon evaluation today patient appears to be doing well currently in regard to his plantar foot ulcer. He has been tolerating the  dressing changes without complication fortunately. I do believe he is making excellent progress and each time I see him this is better and better is pretty much completely covered as far as any bone exposure is concerned and he still is on the IV antibiotics were still also undergoing hyperbarics at this point that he just got started and is doing quite well with since getting the tubes in his ears.Marland Kitchen 08-03-2022 upon evaluation today patient appears to be doing well in general although has been having a lot of drainage compared to normal. It was actually leaking through his dressing today. He tells me he changed it last night. With that being said he has been up on his feet a lot more which I think is probably a big part of the issue here. Fortunately I do not see any signs of infection locally or systemically. 08-17-2022 upon evaluation today patient is doing well with regard to his wound this is actually measuring smaller which is good news. I am very pleased in that regard. With that being said he has not been participating HBO therapy as he really needs to afford to see this heal appropriately and completely. In fact he has only made 9 total HBO treatments in the past 4 weeks. Again this is not therapeutic and I discussed that with the patient. Nonetheless he is still improving and since the hyperbarics is not helping based on the discussion today the patient is going to discontinue HBO therapy at this point. He tells me that getting here 5 days a week just is not possible. He has had difficulty getting here and then also when he was here had difficulty with getting here at the right time which has also been part of the problem. Nonetheless either way I am pleased that he is doing better and my hope is that we will still be able to get him healed obviously he should continue with the antibiotics as well as the wound care as he has been doing this seems to be doing excellent for him but everything is  improving quite nicely. 08-24-2022 upon evaluation today patient appears to be doing well currently in regard to his wound this is actually measuring significantly better even compared to last week. I am very pleased in that regard I do not see any signs of infection and I think  he is making excellent progress here. 08-31-2022 upon evaluation today patient's wound is actually showing signs of significant improvement. I am actually very pleased with where things stand currently. There does not appear to be any evidence of active infection locally or systemically at this time. No fevers, chills, nausea, vomiting, or diarrhea. Objective Constitutional Well-nourished and well-hydrated in no acute distress. Vitals Time Taken: 12:37 PM, Height: 70 in, Weight: 230 lbs, BMI: 33, Temperature: 98 F, Pulse: 82 bpm, Respiratory Rate: 18 breaths/min, Blood Pressure: 133/77 mmHg. Respiratory normal breathing without difficulty. Psychiatric this patient is able to make decisions and demonstrates good insight into disease process. Alert and Oriented x 3. pleasant and cooperative. General Notes: Upon inspection patient's wound again did require sharp debridement clearway some of the necrotic debris he tolerated that debridement today without complication and postdebridement the wound bed appears to be doing much better which is also news. Integumentary (Hair, Skin) Wound #1 status is Open. Original cause of wound was Gradually Appeared. The date acquired was: 12/04/2021. The wound has been in treatment 11 weeks. The wound is located on the Left,Plantar Foot. The wound measures 9cm length x 7.2cm width x 0.2cm depth; 50.894cm^2 area and 10.179cm^3 volume. There is Fat Layer (Subcutaneous Tissue) exposed. There is no tunneling or undermining noted. There is a medium amount of serosanguineous drainage noted. There is large (67-100%) pink, pale granulation within the wound bed. There is a small (1-33%) amount  of necrotic tissue within the wound bed including Adherent Slough. 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 There was a Excisional Skin/Subcutaneous Tissue Debridement with a total area of 15 sq cm performed by Nelida Meuse., PA-C. With the following instrument(s): Curette to remove Viable and Non-Viable tissue/material. Material removed includes Callus, Subcutaneous Tissue, Slough, Skin: Dermis, Skin: Epidermis, and Biofilm. A time out was conducted at 12:52, prior to the start of the procedure. A Moderate amount of bleeding was controlled with Pressure. The procedure was tolerated well with a pain level of 0 throughout and a pain level of 0 following the procedure. Post Debridement Measurements: 9cm length x 7.2cm width x 0.2cm depth; 10.179cm^3 volume. Character of Wound/Ulcer Post Debridement is improved. Severity of Tissue Post Debridement is: Fat layer exposed. Post procedure Diagnosis Wound #1: Same as Pre-Procedure Plan Follow-up Appointments: Return Appointment in 1 week. Bathing/ Shower/ Hygiene: May shower; gently cleanse wound with antibacterial soap, rinse and pat dry prior to dressing wounds Anesthetic (Use 'Patient Medications' Section for Anesthetic Order Entry): Lidocaine applied to wound bed Edema Control - Lymphedema / Segmental Compressive Device / Other: Elevate, Exercise Daily and Avoid Standing for Long Periods of Time. Elevate legs to the level of the heart and pump ankles as often as possible Elevate leg(s) parallel to the floor when sitting. WOUND #1: - Foot Wound Laterality: Plantar,  Left Cleanser: Dakin 16 (oz) 0.25 1 x Per Day/30 Days Discharge Instructions: Use as directed. Primary Dressing: Gauze (Generic) 1 x Per Day/30 Days Discharge Instructions: moistened with 1/4 Dakins Solution Secondary Dressing: ABD Pad 5x9 (in/in) (Generic) 1 x Per Day/30 Days Discharge Instructions: Cover with ABD pad Secondary Dressing: Kerlix 4.5 x 4.1 (in/yd) (Generic)  1 x Per Day/30 Days Discharge Instructions: Apply Kerlix 4.5 x 4.1 (in/yd) as instructed Secured With: Medipore Tape - 19M Medipore H Soft Cloth Surgical Tape, 2x2 (in/yd) (Generic) 1 x Per Day/30 Days 1. I am good recommend that we go ahead and have the patient continue to monitor for any signs of worsening or infection. Obviously if anything changes he should contact the office and let me know. 2. I am also can recommend that we have the patient continue with an ABD pad to cover followed by roll gauze to secure in place. 3. The good news is he is continued on the antibiotics for another month to month and a half which will be perfect. By that time I am hoping this wound will be much smaller and will be much closer to complete resolution. I do think that he is a candidate to look into the possibility of a skin substitute to try to get this closed faster. We will consider that again as we get closer to time. Nonetheless right now I am not going to order that as of yet. We will see patient back for reevaluation in 1 week here in the clinic. If anything worsens or changes patient will contact our office for additional recommendations. Electronic Signature(s) Signed: 08/31/2022 2:06:45 PM By: Lenda Kelp PA-C Entered By: Lenda Kelp on 08/31/2022 14:06:45 [name] ([id number]) -------------------------------------------------------------------------------- SuperBill Details Patient Name: [name] Date of Service: 08/31/2022 Medical Record Number: [id number] Patient Account Number: [id number] Date of Birth/Sex:  [birth year] (56 y.o. M) Treating RN: Yevonne Pax Primary Care Provider: PATIENT, NO Other Clinician: Referring Provider: Duanne Guess Treating Provider/Extender: Rowan Blase in Treatment: 11 Diagnosis Coding ICD-10 Codes Code Description [phone] Other chronic osteomyelitis, left ankle and foot E11.621 Type 2 diabetes mellitus with foot ulcer L97.524 Non-pressure chronic ulcer of other part of left foot with necrosis of bone Facility Procedures CPT4 Code: [id number] Description: 11042 - DEB SUBQ TISSUE 20 SQ CM/< Modifier: Quantity: 1 CPT4 Code: Description: ICD-10 Diagnosis Description L97.524 Non-pressure chronic ulcer of other part of left foot with necrosis of b Modifier: one Quantity: Physician Procedures CPT4 Code: [id number] Description: 11042 - WC PHYS SUBQ TISS 20 SQ CM Modifier: Quantity: 1 CPT4 Code: Description: ICD-10 Diagnosis Description L97.524 Non-pressure chronic ulcer of other part of left foot with necrosis of b Modifier: one Quantity: Electronic Signature(s) Signed: 08/31/2022 2:06:56 PM By: Lenda Kelp PA-C Entered By: Lenda Kelp on 08/31/2022 14:06:56

## 2022-09-07 ENCOUNTER — Ambulatory Visit: Payer: 59 | Admitting: Physician Assistant

## 2022-09-11 ENCOUNTER — Ambulatory Visit: Payer: 59 | Attending: Family Medicine | Admitting: Family Medicine

## 2022-09-11 ENCOUNTER — Encounter: Payer: Self-pay | Admitting: Family Medicine

## 2022-09-11 ENCOUNTER — Other Ambulatory Visit: Payer: Self-pay

## 2022-09-11 VITALS — BP 137/90 | HR 87 | Ht 70.0 in | Wt 238.8 lb

## 2022-09-11 DIAGNOSIS — E1169 Type 2 diabetes mellitus with other specified complication: Secondary | ICD-10-CM

## 2022-09-11 DIAGNOSIS — E1142 Type 2 diabetes mellitus with diabetic polyneuropathy: Secondary | ICD-10-CM

## 2022-09-11 DIAGNOSIS — Z1211 Encounter for screening for malignant neoplasm of colon: Secondary | ICD-10-CM | POA: Diagnosis not present

## 2022-09-11 DIAGNOSIS — Z794 Long term (current) use of insulin: Secondary | ICD-10-CM

## 2022-09-11 DIAGNOSIS — L089 Local infection of the skin and subcutaneous tissue, unspecified: Secondary | ICD-10-CM

## 2022-09-11 DIAGNOSIS — E11628 Type 2 diabetes mellitus with other skin complications: Secondary | ICD-10-CM

## 2022-09-11 LAB — GLUCOSE, POCT (MANUAL RESULT ENTRY): POC Glucose: 332 mg/dl — AB (ref 70–99)

## 2022-09-11 LAB — POCT GLYCOSYLATED HEMOGLOBIN (HGB A1C): HbA1c, POC (controlled diabetic range): 9.6 % — AB (ref 0.0–7.0)

## 2022-09-11 MED ORDER — METFORMIN HCL 500 MG PO TABS
1000.0000 mg | ORAL_TABLET | Freq: Two times a day (BID) | ORAL | 1 refills | Status: DC
  Filled 2022-09-11: qty 120, 30d supply, fill #0

## 2022-09-11 MED ORDER — TRULICITY 0.75 MG/0.5ML ~~LOC~~ SOAJ
0.7500 mg | SUBCUTANEOUS | 1 refills | Status: DC
  Filled 2022-09-11: qty 2, 28d supply, fill #0

## 2022-09-11 MED ORDER — GABAPENTIN 300 MG PO CAPS
300.0000 mg | ORAL_CAPSULE | Freq: Every day | ORAL | 3 refills | Status: DC
  Filled 2022-09-11: qty 30, 30d supply, fill #0

## 2022-09-11 NOTE — Progress Notes (Signed)
Subjective:  Patient ID: [name], male    DOB: 1966/11/17  Age: 56 y.o. MRN: [id number]  CC: Diabetes (Diabetes f/u. Fingers tingling, pain. Hands, arms are feeling cold. Andres Labrum on both feet. )   HPI Adan Baehr is a 56 y.o. 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 He has not been working due to his foot but plans to return back to work. Currently under the care of wound care for his left foot diabetic ulcer.  Fasting blood sugars range from 150-225 and he endorses adherence with his hemoglobin and and metformin.  At his last visit I had placed him on Ozempic which was subsequently changed to Trulicity due to insurance coverage but it appears this medication was discontinued from his medication list when he went to his 1 care appointment. He is unaware that he should have been taking Trulicity. A1c is 9.6 down from 11.5 previously  Past Medical History:  Diagnosis Date   Diabetes mellitus without complication [facility])     Past Surgical History:  Procedure Laterality Date   I & D EXTREMITY Left 05/05/2022   Procedure: LEFT FOOT DEBRIDEMENT;  Surgeon: Nadara Mustard, MD;  Location: [facility] OR;  Service: Orthopedics;  Laterality: Left;   MYRINGOTOMY WITH TUBE PLACEMENT Bilateral 07/20/2022   Procedure: MYRINGOTOMY WITH TUBE PLACEMENT;  Surgeon: Vernie Murders, MD;  Location: [facility];  Service: ENT;  Laterality: Bilateral;  Diabetic   TONSILLECTOMY      No family history on file.  Social History   Socioeconomic History   Marital status: Single    Spouse name: Not on file   Number of children: Not on file   Years of education: Not on file   Highest education  level: Not on file  Occupational History   Not on file  Tobacco Use   Smoking status: Every Day    Packs/day: 0.25    Years: 25.00    Total pack years: 6.25    Types: Cigarettes   Smokeless tobacco: Never   Tobacco comments:    Cutting back - goes through a pack in 4-6 days (Started around age 58)  Vaping Use   Vaping Use: Never used  Substance and Sexual Activity   Alcohol use: No   Drug use: No   Sexual activity: Yes  Other Topics Concern   Not on file  Social History Narrative   Not on file   Social Determinants of Health   Financial Resource Strain: Not on file  Food Insecurity: Not on file  Transportation Needs: Not on file  Physical Activity: Not on file  Stress: Not on file  Social Connections: Not on file    No Known Allergies  Outpatient Medications Prior to Visit  Medication Sig Dispense Refill   acetaminophen (TYLENOL) 500 MG tablet Take 2,000 mg by mouth every 6 (six) hours as needed for mild pain or moderate pain.     atorvastatin (LIPITOR) 20 MG tablet Take 1 tablet (20 mg total) by mouth daily. 90 tablet 1   cefadroxil (DURICEF) 500 MG capsule Take 1 capsule (500 mg total) by mouth 2 (two) times daily. 60 capsule 1  ciprofloxacin-dexamethasone (CIPRODEX) OTIC suspension Place 4 drops in both ears twice a day for 5 days 7.5 mL 0   Continuous Blood Gluc Transmit (DEXCOM G6 TRANSMITTER) MISC Use to check blood sugar three times daily. Change transmitter once every 909 days. E11.69 1 each 1   Insulin NPH, Human,, Isophane, (HUMULIN N KWIKPEN) 100 UNIT/ML Kiwkpen Inject 20 Units into the skin 2 (two) times daily. 30 mL 5   Insulin Pen Needle 32G X 4 MM MISC Use as directed to inject insulin up to 4 times daily. 100 each 0   levofloxacin (LEVAQUIN) 750 MG tablet Take 1 tablet (750 mg total) by mouth daily. 30 tablet 1   methocarbamol (ROBAXIN) 500 MG tablet Take 1 tablet (500 mg total) by mouth every 6 (six) hours as needed for muscle spasms. 30 tablet 0    metroNIDAZOLE (FLAGYL) 500 MG tablet Take 1 tablet (500 mg total) by mouth 2 (two) times daily. 60 tablet 1   metFORMIN (GLUCOPHAGE) 500 MG tablet Take 1 tablet (500 mg total) by mouth 2 (two) times daily. 180 tablet 1   No facility-administered medications prior to visit.     ROS Review of Systems  Constitutional:  Negative for activity change and appetite change.  HENT:  Negative for sinus pressure and sore throat.   Respiratory:  Negative for chest tightness, shortness of breath and wheezing.   Cardiovascular:  Negative for chest pain and palpitations.  Gastrointestinal:  Negative for abdominal distention, abdominal pain and constipation.  Genitourinary: Negative.   Musculoskeletal: Negative.   Neurological:  Positive for numbness.  Psychiatric/Behavioral:  Negative for behavioral problems and dysphoric mood.     Objective:  BP (!) 137/90 (BP Location: Left Arm, Patient Position: Sitting, Cuff Size: Normal)   Pulse 87   Ht 5\' 10"  (1.778 m)   Wt 238 lb 12.8 oz (108.3 kg)   SpO2 99%   BMI 34.26 kg/m      09/11/2022    2:58 PM 08/29/2022    8:53 AM 08/01/2022    9:15 AM  BP/Weight  Systolic BP 137 134 107  Diastolic BP 90 87 74  Wt. (Lbs) 238.8  235  BMI 34.26 kg/m2  33.72 kg/m2      Physical Exam Constitutional:      Appearance: He is well-developed.  Cardiovascular:     Rate and Rhythm: Normal rate.     Heart sounds: Normal heart sounds. No murmur heard. Pulmonary:     Effort: Pulmonary effort is normal.     Breath sounds: Normal breath sounds. No wheezing or rales.  Chest:     Chest wall: No tenderness.  Abdominal:     General: Bowel sounds are normal. There is no distension.     Palpations: Abdomen is soft. There is no mass.     Tenderness: There is no abdominal tenderness.  Musculoskeletal:        General: Normal range of motion.     Right lower leg: No edema.     Left lower leg: No edema.     Comments: Left foot is wrapped in Ace wrap  Neurological:      Mental Status: He is alert and oriented to person, place, and time.  Psychiatric:        Mood and Affect: Mood normal.       Latest Ref Rng & Units 08/01/2022   10:22 AM 06/20/2022   10:47 AM 05/28/2022    4:43 PM  CMP  Glucose 70 - 99  mg/dL 174  081  448   BUN 6 - 20 mg/dL 20  23  18    Creatinine 0.61 - 1.24 mg/dL  1.85  6.31   Sodium 135 - 145 mmol/L 142  141  138   Potassium 3.5 - 5.1 mmol/L 4.2  4.9  4.3   Chloride 98 - 111 mmol/L 109  106  105   CO2 22 - 32 mmol/L 28  31  23    Calcium 8.9 - 10.3 mg/dL 8.9  8.8  8.8   Total Protein 6.5 - 8.1 g/dL 7.2  6.6    Total Bilirubin 0.3 - 1.2 mg/dL 0.3  0.2    Alkaline Phos 38 - 126 U/L 61     AST 15 - 41 U/L 15  13    ALT 0 - 44 U/L 16  9      Lipid Panel     Component Value Date/Time   CHOL 101 12/14/2021 1239   TRIG 134 12/14/2021 1239   HDL 29 (L) 12/14/2021 1239   CHOLHDL 3.5 12/14/2021 1239   VLDL 27 12/14/2021 1239   LDLCALC 45 12/14/2021 1239    CBC    Component Value Date/Time   WBC 6.5 08/01/2022 1022   RBC 4.58 08/01/2022 1022   HGB 11.9 (L) 08/01/2022 1022   HCT 38.1 (L) 08/01/2022 1022   PLT 323 08/01/2022 1022   MCV 83.2 08/01/2022 1022   MCH 26.0 08/01/2022 1022   MCHC 31.2 08/01/2022 1022   RDW 14.9 08/01/2022 1022   LYMPHSABS 2.4 08/01/2022 1022   MONOABS 0.5 08/01/2022 1022   EOSABS 0.2 08/01/2022 1022   BASOSABS 0.0 08/01/2022 1022    Lab Results  Component Value Date   HGBA1C 9.6 (A) 09/11/2022    Assessment & Plan:  1. Type 2 diabetes mellitus with other specified complication, with long-term current use of insulin (HCC) Uncontrolled with A1c of 9.6 which is above goal of less than 7.0 We will restart Trulicity and he will see the clinical pharmacist to titrate the dose.  Counseled on cardiovascular, weight loss benefits and discussed adverse effects of Trulicity Metformin dose has also been increased from 500 mg twice daily to 1000 mg twice daily Counseled on Diabetic diet, my  plate method, 08/03/2022 minutes of moderate intensity exercise/week Blood sugar logs with fasting goals of 80-120 mg/dl, random of less than 11/11/2022 and in the event of sugars less than 60 mg/dl or greater than 026 mg/dl encouraged to notify the clinic. Advised on the need for annual eye exams, annual foot exams, Pneumonia vaccine. - POCT glucose (manual entry) - POCT glycosylated hemoglobin (Hb A1C) - Ambulatory referral to Ophthalmology - metFORMIN (GLUCOPHAGE) 500 MG tablet; Take 2 tablets (1,000 mg total) by mouth 2 (two) times daily.  Dispense: 360 tablet; Refill: 1 - Dulaglutide (TRULICITY) 0.75 MG/0.5ML SOPN; Inject 0.75 mg into the skin once a week.  Dispense: 2 mL; Refill: 1  2. Screening for colon cancer - Ambulatory referral to Gastroenterology  3. Diabetic foot infection (HCC) Currently undergoing treatment at the hyperbaric wound center Advised that glycemic control is necessary factor with respect to wound healing  4. Diabetic polyneuropathy associated with type 2 diabetes mellitus (HCC) Uncontrolled We will initiate gabapentin, discussed adverse effects Consider increasing dose if symptoms persist I am holding off on daytime dose of gabapentin since he works on ladders a lot at his job to prevent sedation - gabapentin (NEURONTIN) 300 MG capsule; Take 1 capsule (300 mg total)  by mouth at bedtime.  Dispense: 30 capsule; Refill: 3    Health Care Maintenance: Declines flu shot Meds ordered this encounter  Medications   gabapentin (NEURONTIN) 300 MG capsule    Sig: Take 1 capsule (300 mg total) by mouth at bedtime.    Dispense:  30 capsule    Refill:  3   metFORMIN (GLUCOPHAGE) 500 MG tablet    Sig: Take 2 tablets (1,000 mg total) by mouth 2 (two) times daily.    Dispense:  360 tablet    Refill:  1    Dose increased   Dulaglutide (TRULICITY) 0.62 BJ/6.2GB SOPN    Sig: Inject 0.75 mg into the skin once a week.    Dispense:  2 mL    Refill:  1    Follow-up: Return in about 1  month (around 10/12/2022) for Blood sugar evaluation with Lurena Joiner, Medical conditions with PCP in 3 months.       Charlott Rakes, MD, FAAFP. [facility] [place], [place]   09/11/2022, 4:43 PM

## 2022-09-11 NOTE — Patient Instructions (Signed)
Diabetic Neuropathy Diabetic neuropathy refers to nerve damage that is caused by diabetes. Over time, people with diabetes can develop nerve damage throughout the body. There are several types of diabetic neuropathy: Peripheral neuropathy. This is the most common type of diabetic neuropathy. It damages the nerves that carry signals between the spinal cord and other parts of the body (peripheral nerves). This usually affects nerves in the feet, legs, hands, and arms. Autonomic neuropathy. This type causes damage to nerves that control involuntary functions (autonomic nerves). Involuntary functions are functions of the body that you do not control. They include heartbeat, body temperature, blood pressure, urination, digestion, sweating, sexual function, or response to changes in blood glucose. Focal neuropathy. This type of nerve damage affects one area of the body, such as an arm, a leg, or the face. The injury may involve one nerve or a small group of nerves. Focal neuropathy can be painful and unpredictable. It occurs most often in older adults with diabetes. This often develops suddenly, but usually improves over time and does not cause long-term problems. Proximal neuropathy. This type of nerve damage affects the nerves of the thighs, hips, buttocks, or legs. It causes severe pain, weakness, and muscle death (atrophy), usually in the thigh muscles. It is more common among older men and people who have type 2 diabetes. The length of recovery time may vary. What are the causes? Peripheral, autonomic, and focal neuropathies are caused by diabetes that is not well controlled with treatment. The cause of proximal neuropathy is not known, but it may be caused by inflammation related to uncontrolled blood glucose levels. What are the signs or symptoms? Peripheral neuropathy Peripheral neuropathy develops slowly over time. When the nerves of the feet and legs no longer work, you may experience: Burning,  stabbing, or aching pain in the legs or feet. Pain or cramping in the legs or feet. Loss of feeling (numbness) and inability to feel pressure or pain in the feet. This can lead to: Thick calluses or sores on areas of constant pressure. Ulcers. Reduced ability to feel temperature changes. Foot deformities. Muscle weakness. Loss of balance or coordination. Autonomic neuropathy The symptoms of autonomic neuropathy vary depending on which nerves are affected. Symptoms may include: Problems with digestion, such as: Nausea or vomiting. Poor appetite. Bloating. Diarrhea or constipation. Trouble swallowing. Losing weight without trying to. Problems with the heart, blood, and lungs, such as: Dizziness, especially when standing up. Fainting. Shortness of breath. Irregular heartbeat. Bladder problems, such as: Trouble starting or stopping urination. Leaking urine. Trouble emptying the bladder. Urinary tract infections (UTIs). Problems with other body functions, such as: Sweat. You may sweat too much or too little. Temperature. You might get hot easily. Or, you might feel cold more than usual. Sexual function. Men may not be able to get or maintain an erection. Women may have vaginal dryness and difficulty with arousal. Focal neuropathy Symptoms affect only one area of the body. Common symptoms include: Numbness. Tingling. Burning pain. Prickling feeling. Very sensitive skin. Weakness. Inability to move (paralysis). Muscle twitching. Muscles getting smaller (wasting). Poor coordination. Double or blurred vision. Proximal neuropathy Sudden, severe pain in the hip, thigh, or buttocks. Pain may spread from the back into the legs (sciatica). Pain and numbness in the arms and legs. Tingling. Loss of bladder control or bowel control. Weakness and wasting of thigh muscles. Difficulty getting up from a seated position. Abdominal swelling. Unexplained weight loss. How is this  diagnosed? Diagnosis varies depending on the type   of neuropathy your health care provider suspects. Peripheral neuropathy Your health care provider will do a neurologic exam. This exam checks your reflexes, how you move, and what you can feel. You may have other tests, such as: Blood tests. Tests of the fluid that surrounds the spinal cord (lumbar puncture). CT scan. MRI. Checking the nerves that control muscles (electromyogram, or EMG). Checking how quickly signals pass through your nerves (nerve conduction study). Checking a small piece of a nerve using a microscope (biopsy). Autonomic neuropathy You may have tests, such as: Tests to measure your blood pressure and heart rate. You may be secured to an exam table that moves you from a lying position to an upright position (table tilt test). Breathing tests to check your lungs. Tests to check how food moves through the digestive system (gastric emptying tests). Blood, sweat, or urine tests. Ultrasound of your bladder. Spinal fluid tests. Focal neuropathy This condition may be diagnosed with: A neurologic exam. CT scan. MRI. EMG. Nerve conduction study. Proximal neuropathy There is no test to diagnose this type of neuropathy. You may have tests to rule out other possible causes of this type of neuropathy. Tests may include: X-rays of your spine and lumbar region. Lumbar puncture. MRI. How is this treated? The goal of treatment is to keep nerve damage from getting worse. Treatment may include: Following your diabetes management plan. This will help keep your blood glucose level and your A1C level within your target range. This is the most important treatment. Using prescription pain medicine. Follow these instructions at home: Diabetes management Follow your diabetes management plan as told by your health care provider. Check your blood glucose levels. Keep your blood glucose in your target range. Have your A1C level checked at  least two times a year, or as often as told. Take over the counter and prescription medicines only as told by your health care provider. This includes insulin and diabetes medicine.  Lifestyle  Do not use any products that contain nicotine or tobacco, such as cigarettes, e-cigarettes, and chewing tobacco. If you need help quitting, ask your health care provider. Be physically active every day. Include strength training and balance exercises. Follow a healthy meal plan. Work with your health care provider to manage your blood pressure. General instructions Ask your health care provider if the medicine prescribed to you requires you to avoid driving or using machinery. Check your skin and feet every day for cuts, bruises, redness, blisters, or sores. Keep all follow-up visits. This is important. Contact a health care provider if: You have burning, stabbing, or aching pain in your legs or feet. You are unable to feel pressure or pain in your feet. You develop problems with digestion, such as: Nausea. Vomiting. Bloating. Constipation. Diarrhea. Abdominal pain. You have difficulty with urination, such as: Inability to control when you urinate (incontinence). Inability to completely empty the bladder (retention). You feel as if your heart is racing (palpitations). You feel dizzy, weak, or faint when you stand up. Get help right away if: You cannot urinate. You have sudden weakness or loss of coordination. You have trouble speaking. You have pain or pressure in your chest. You have an irregular heartbeat. You have sudden inability to move a part of your body. These symptoms may represent a serious problem that is an emergency. Do not wait to see if the symptoms will go away. Get medical help right away. Call your local emergency services (911 in the [place] Do not drive yourself to   the hospital. Summary Diabetic neuropathy is nerve damage that is caused by diabetes. It can cause numbness  and pain in the arms, legs, digestive tract, heart, and other body systems. This condition is treated by keeping your blood glucose level and your A1C level within your target range. This can help prevent neuropathy from getting worse. Check your skin and feet every day for cuts, bruises, redness, blisters, or sores. Do not use any products that contain nicotine or tobacco, such as cigarettes, e-cigarettes, and chewing tobacco. This information is not intended to replace advice given to you by your health care provider. Make sure you discuss any questions you have with your health care provider. Document Revised: 04/01/2020 Document Reviewed: 04/01/2020 Elsevier Patient Education  2023 [employer]  

## 2022-09-12 ENCOUNTER — Ambulatory Visit: Payer: 59 | Admitting: Physician Assistant

## 2022-09-15 ENCOUNTER — Other Ambulatory Visit: Payer: Self-pay

## 2022-09-19 ENCOUNTER — Encounter: Payer: 59 | Attending: Physician Assistant | Admitting: Physician Assistant

## 2022-09-19 DIAGNOSIS — R69 Illness, unspecified: Secondary | ICD-10-CM | POA: Diagnosis not present

## 2022-09-19 DIAGNOSIS — F17200 Nicotine dependence, unspecified, uncomplicated: Secondary | ICD-10-CM | POA: Diagnosis not present

## 2022-09-19 DIAGNOSIS — E1169 Type 2 diabetes mellitus with other specified complication: Secondary | ICD-10-CM | POA: Diagnosis not present

## 2022-09-19 DIAGNOSIS — M86672 Other chronic osteomyelitis, left ankle and foot: Secondary | ICD-10-CM | POA: Insufficient documentation

## 2022-09-19 DIAGNOSIS — L97524 Non-pressure chronic ulcer of other part of left foot with necrosis of bone: Secondary | ICD-10-CM | POA: Diagnosis not present

## 2022-09-19 DIAGNOSIS — E11621 Type 2 diabetes mellitus with foot ulcer: Secondary | ICD-10-CM | POA: Insufficient documentation

## 2022-09-19 DIAGNOSIS — L97522 Non-pressure chronic ulcer of other part of left foot with fat layer exposed: Secondary | ICD-10-CM | POA: Diagnosis not present

## 2022-09-19 NOTE — Progress Notes (Signed)
[name], [name] ([id number]) [id number] Page 1 of 7 Visit Report for 09/19/2022 Arrival Information Details Patient Name: Date of Service: [name] Matthew, [name] 09/19/2022 10:00 A M Medical Record Number: [id number] Patient Account Number: [id number] Date of Birth/Sex: Treating RN: 1966/08/04 (56 y.o. [name] Primary Care [name]: PA [name], [place] Other Clinician: Referring [name]: Treating [name]/Extender: [name] in Treatment: 13 Visit Information History Since Last Visit Added or deleted any medications: Yes Patient Arrived: Knee Scooter Has Dressing in Place as Prescribed: Yes Arrival Time: 10:07 Pain Present Now: No Accompanied By: self Transfer Assistance: None Patient Identification Verified: Yes Secondary Verification Process Completed: Yes Patient Requires Transmission-Based Precautions: No Patient Has Alerts: No Electronic Signature(s) Signed: 09/19/2022 2:48:39 PM By: [name], BSN, RN, CWS, Kim RN, BSN Entered By: [name], BSN, RN, CWS, [name] on 09/19/2022 10:08:08 -------------------------------------------------------------------------------- Encounter Discharge Information Details Patient Name: Date of Service: MA Matthew, [name] 09/19/2022 10:00 A M Medical Record Number: [id number] Patient Account Number: [id number] Date of Birth/Sex: Treating RN: 1966/09/06 (56 y.o. [name] Primary Care [name]: PA [name], [place] Other Clinician: Referring Candyce Gambino: Treating [name]/Extender: [name] in Treatment: 13 Encounter Discharge Information Items Post Procedure Vitals Discharge Condition: Stable Temperature (F): 98.1 Ambulatory Status: Knee Scooter Pulse (bpm): 97 Discharge Destination: Home Respiratory Rate (breaths/min): 16 Transportation: Private Auto Blood Pressure (mmHg): 127/83 Accompanied By: self Schedule Follow-up Appointment: Yes Clinical Summary of Care: Electronic Signature(s) Signed:  09/19/2022 2:48:39 PM By: [name], BSN, RN, CWS, Kim RN, BSN [name], [place] ([id number]) [id number].pdf Page 2 of 7 Entered By: [name], BSN, RN, CWS, [name] on 09/19/2022 10:38:16 -------------------------------------------------------------------------------- Lower Extremity Assessment Details Patient Name: Date of Service: MA [name], [name] 09/19/2022 10:00 A M Medical Record Number: [id number] Patient Account Number: [id number] Date of Birth/Sex: Treating RN: Aug 24, 1966 (56 y.o. [name] Primary Care [name]: PA [name], [place] Other Clinician: Referring Ash Mcelwain: Treating Gabreil Yonkers/Extender: [name] in Treatment: 13 Edema Assessment Assessed: [Left: Yes] [Right: No] Edema: [Left: N] [Right: o] Vascular Assessment Pulses: Dorsalis Pedis Palpable: [Left:Yes] Electronic Signature(s) Signed: 09/19/2022 2:48:39 PM By: [name], BSN, RN, CWS, Kim RN, BSN Entered By: [name], BSN, RN, CWS, [name] on 09/19/2022 10:15:05 -------------------------------------------------------------------------------- Multi Wound Chart Details Patient Name: Date of Service: MA [name], [name] 09/19/2022 10:00 A M Medical Record Number: [id number] Patient Account Number: [id number] Date of Birth/Sex: Treating RN: 05/09/1966 (56 y.o. [name] Primary Care Abdinasir Spadafore: PA [name], [place] Other Clinician: Referring Kaytlyn Din: Treating Andersson Larrabee/Extender: [name] in Treatment: 13 Vital Signs Height(in): 70 Pulse(bpm): 97 Weight(lbs): 230 Blood Pressure(mmHg): 127/83 Body Mass Index(BMI): 33 Temperature(F): 98.1 Respiratory Rate(breaths/min): 16 [1:Photos:] [N/A:N/A] Left, Plantar Foot N/A N/A Wound Location: Gradually Appeared N/A N/A Wounding Event: Diabetic Wound/Ulcer of the Lower N/A N/A Primary Etiology: Extremity Type II Diabetes N/A N/A Comorbid History: 12/04/2021 N/A N/A Date Acquired: 13 N/A N/A Weeks of Treatment: Open N/A N/A Wound  Status: No N/A N/A Wound Recurrence: Yes N/A N/A Pending A mputation on Presentation: 8.5x7x0.2 N/A N/A Measurements L x W x D (cm) 46.731 N/A N/A A (cm) : rea 9.346 N/A N/A Volume (cm) : 53.50% N/A N/A % Reduction in A rea: 81.40% N/A N/A % Reduction in Volume: Grade 4 N/A N/A Classification: Large N/A N/A Exudate A mount: Serosanguineous N/A N/A Exudate Type: red, brown N/A N/A Exudate Color: Medium (34-66%) N/A N/A Granulation A mount: Pink, Hyper-granulation N/A N/A Granulation Quality: Medium (34-66%) N/A N/A Necrotic A mount: Fat Layer (Subcutaneous Tissue): Yes  N/A N/A Exposed Structures: Fascia: No Tendon: No Muscle: No Joint: No Bone: No None N/A N/A Epithelialization: Treatment Notes Electronic Signature(s) Signed: 09/19/2022 2:48:39 PM By: [name], BSN, RN, CWS, Kim RN, BSN Entered By: [name], BSN, RN, CWS, [name] on 09/19/2022 10:25:37 -------------------------------------------------------------------------------- Multi-Disciplinary Care Plan Details Patient Name: Date of Service: MA [name], [name] 09/19/2022 10:00 A M Medical Record Number: [id number] Patient Account Number: [id number] Date of Birth/Sex: Treating RN: [birth year] (56 y.o. [name] Primary Care Nekesha Font: PA [name], [place] Other Clinician: Referring Aimee Timmons: Treating Annastacia Duba/Extender: [name] in Treatment: 13 Active Inactive Osteomyelitis Nursing Diagnoses: Infection: osteomyelitis Knowledge deficit related to disease process and management Potential for infection: osteomyelitis [name], [name] ([id number]) [id number] Page 4 of 7 Goals: Diagnostic evaluation for osteomyelitis completed as ordered Date Initiated: 06/28/2022 Date Inactivated: 08/03/2022 Target Resolution Date: 06/28/2022 Goal Status: Met Patient/caregiver will verbalize understanding of disease process and disease management Date Initiated: 06/28/2022 Date Inactivated:  08/31/2022 Target Resolution Date: 06/28/2022 Goal Status: Met Patient's osteomyelitis will resolve Date Initiated: 06/28/2022 Target Resolution Date: 06/28/2022 Goal Status: Active Signs and symptoms for osteomyelitis will be recognized and promptly addressed Date Initiated: 06/28/2022 Date Inactivated: 08/03/2022 Target Resolution Date: 06/28/2022 Goal Status: Met Interventions: Assess for signs and symptoms of osteomyelitis resolution every visit Provide education on osteomyelitis Screen for HBO Notes: Wound/Skin Impairment Nursing Diagnoses: Knowledge deficit related to ulceration/compromised skin integrity Goals: Patient/caregiver will verbalize understanding of skin care regimen Date Initiated: 06/15/2022 Date Inactivated: 08/03/2022 Target Resolution Date: 07/16/2022 Goal Status: Met Ulcer/skin breakdown will have a volume reduction of 30% by week 4 Date Initiated: 06/15/2022 Date Inactivated: 08/03/2022 Target Resolution Date: 07/16/2022 Goal Status: Unmet Unmet Reason: comorbities Ulcer/skin breakdown will have a volume reduction of 50% by week 8 Date Initiated: 06/15/2022 Date Inactivated: 08/31/2022 Target Resolution Date: 08/16/2022 Goal Status: Unmet Unmet Reason: comorbdities Ulcer/skin breakdown will have a volume reduction of 80% by week 12 Date Initiated: 06/15/2022 Target Resolution Date: 09/15/2022 Goal Status: Active Ulcer/skin breakdown will heal within 14 weeks Date Initiated: 06/15/2022 Target Resolution Date: 10/16/2022 Goal Status: Active Interventions: Assess patient/caregiver ability to obtain necessary supplies Assess patient/caregiver ability to perform ulcer/skin care regimen upon admission and as needed Assess ulceration(s) every visit Notes: Electronic Signature(s) Signed: 09/19/2022 2:48:39 PM By: [name], BSN, RN, CWS, Kim RN, BSN Entered By: [name], BSN, RN, CWS, [name] on 09/19/2022  10:17:41 -------------------------------------------------------------------------------- Pain Assessment Details Patient Name: Date of Service: MA Matthew, [name] 09/19/2022 10:00 A M Medical Record Number: [id number] Patient Account Number: [id number] Date of Birth/Sex: Treating RN: 1966-10-26 (56 y.o. [name] Primary Care Jeany Seville: PA [name], [place] Other Clinician: Referring Shaelee Forni: Treating Jazzmyn Filion/Extender: [name] in Treatment: [address] [place] ([id number]) [id number] Page 5 of 7 Active Problems Location of Pain Severity and Description of Pain Patient Has Paino No Site Locations Pain Management and Medication Current Pain Management: Notes Patient denies pain at this time. Electronic Signature(s) Signed: 09/19/2022 2:48:39 PM By: [name], BSN, RN, CWS, Kim RN, BSN Entered By: [name], BSN, RN, CWS, [name] on 09/19/2022 10:09:31 -------------------------------------------------------------------------------- Patient/Caregiver Education Details Patient Name: Date of Service: MA Matthew, [name] 10/17/2023andnbsp10:00 A M Medical Record Number: [id number] Patient Account Number: [id number] Date of Birth/Gender: Treating RN: 1966-05-29 (56 y.o. [name] Primary Care Physician: PA [name], [place] Other Clinician: Referring Physician: Treating Physician/Extender: [name] in Treatment: 13 Education Assessment Education Provided To: Patient Education Topics Provided Wound Debridement: Handouts: Wound Debridement Methods: Demonstration, Explain/Verbal Responses: State content correctly  Wound/Skin Impairment: Handouts: Caring for Your Ulcer, Other: continue wound care as prescribed Methods: Explain/Verbal Responses: State content correctly [place], Truman [place] ([id number]) [id number] Page 6 of 7 Electronic Signature(s) Signed: 09/19/2022 2:48:39 PM By: [name], BSN, RN, CWS, Kim RN, BSN Entered  By: [name], BSN, RN, CWS, [name] on 09/19/2022 10:30:38 -------------------------------------------------------------------------------- Wound Assessment Details Patient Name: Date of Service: MA [name], [name] 09/19/2022 10:00 A M Medical Record Number: [id number] Patient Account Number: [id number] Date of Birth/Sex: Treating RN: 25-Jun-1966 (56 y.o. [name] Primary Care Aashir Umholtz: PA [name], [place] Other Clinician: Referring Tyrick Dunagan: Treating Deanglo Hissong/Extender: [name] in Treatment: 13 Wound Status Wound Number: 1 Primary Etiology: Diabetic Wound/Ulcer of the Lower Extremity Wound Location: Left, Plantar Foot Wound Status: Open Wounding Event: Gradually Appeared Comorbid History: Type II Diabetes Date Acquired: 12/04/2021 Weeks Of Treatment: 13 Clustered Wound: No Pending Amputation On Presentation Photos Wound Measurements Length: (cm) 8.5 Width: (cm) 7 Depth: (cm) 0.2 Area: (cm) 46.731 Volume: (cm) 9.346 % Reduction in Area: 53.5% % Reduction in Volume: 81.4% Epithelialization: None Tunneling: No Undermining: No Wound Description Classification: Grade 4 Exudate Amount: Large Exudate Type: Serosanguineous Exudate Color: red, brown Foul Odor After Cleansing: No Slough/Fibrino Yes Wound Bed Granulation Amount: Medium (34-66%) Exposed Structure Granulation Quality: Pink, Hyper-granulation Fascia Exposed: No Necrotic Amount: Medium (34-66%) Fat Layer (Subcutaneous Tissue) Exposed: Yes Necrotic Quality: Adherent Slough Tendon Exposed: No Muscle Exposed: No Joint Exposed: No Bone Exposed: No [place], [place] ([id number]) [id number] Page 7 of 7 Treatment Notes Wound #1 (Foot) Wound Laterality: Plantar, Left Cleanser Peri-Wound Care Topical Primary Dressing Hydrofera Blue Ready Transfer Foam, 4x5 (in/in) Discharge Instruction: Apply Hydrofera Blue Ready to wound bed as directed Secondary Dressing ABD Pad 5x9  (in/in) Discharge Instruction: Cover with ABD pad Kerlix 4.5 x 4.1 (in/yd) Discharge Instruction: Apply Kerlix 4.5 x 4.1 (in/yd) as instructed Secured With Medipore T - 61M Medipore H Soft Cloth Surgical T ape ape, 2x2 (in/yd) Compression Wrap Compression Stockings Environmental education officer) Signed: 09/19/2022 2:48:39 PM By: [name], BSN, RN, CWS, Kim RN, BSN Entered By: [name], BSN, RN, CWS, [name] on 09/19/2022 10:14:49 -------------------------------------------------------------------------------- Vitals Details Patient Name: Date of Service: MA [name], [name] 09/19/2022 10:00 A M Medical Record Number: [id number] Patient Account Number: [id number] Date of Birth/Sex: Treating RN: 1965-12-14 (56 y.o. [name] Primary Care Marvis Saefong: PA [name], [place] Other Clinician: Referring Keiji Melland: Treating Atzin Buchta/Extender: [name] in Treatment: 13 Vital Signs Time Taken: 10:05 Temperature (F): 98.1 Height (in): 70 Pulse (bpm): 97 Weight (lbs): 230 Respiratory Rate (breaths/min): 16 Body Mass Index (BMI): 33 Blood Pressure (mmHg): 127/83 Reference Range: 80 - 120 mg / dl Electronic Signature(s) Signed: 09/19/2022 2:48:39 PM By: [name], BSN, RN, CWS, Kim RN, BSN Entered By: [name], BSN, RN, CWS, [name] on 09/19/2022 36:43:83

## 2022-09-19 NOTE — Progress Notes (Addendum)
EZEKIAH, [name] ([id number]) [id number] Page 1 of 9 Visit Report for 09/19/2022 Chief Complaint Document Details Patient Name: Date of Service: [name], [name] 09/19/2022 10:00 A M Medical Record Number: [id number] Patient Account Number: [id number] Date of Birth/Sex: Treating RN: 29-Aug-1966 (56 y.o. 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 [name] Primary Care Provider: PA Zenovia Jordan, [place] Other Clinician: Referring Provider: Treating Provider/Extender: [name] in Treatment: 13 Debridement Performed for Assessment: Wound #1 Left,Plantar Foot Performed By: Physician Nelida Meuse., PA-C Debridement Type: Debridement Severity of Tissue Pre Debridement: Fat layer exposed Level of Consciousness (Pre-procedure): Awake and Alert Pre-procedure Verification/Time Out Yes - 10:27 Taken: T Area Debrided (L x W): otal 8.5 (cm) x 7 (cm) = 59.5 (cm) Tissue and other material debrided: Viable, Non-Viable, Slough, Subcutaneous, Biofilm, Slough Level: Skin/Subcutaneous Tissue Debridement Description: Excisional Instrument: Curette Bleeding: Minimum Hemostasis Achieved: Pressure Response to Treatment: Procedure was tolerated well Level of Consciousness (Post- Awake and  Alert procedure): Post Debridement Measurements of Total Wound Kugler, Douglas ([id number]) [id number] Page 2 of 9 Length: (cm) 8.5 Width: (cm) 7 Depth: (cm) 0.3 Volume: (cm) 14.019 Character of Wound/Ulcer Post Debridement: Stable Severity of Tissue Post Debridement: Fat layer exposed Post Procedure Diagnosis Same as Pre-procedure Electronic Signature(s) Signed: 09/19/2022 2:48:39 PM By: Elliot Gurney, BSN, RN, CWS, Kim RN, BSN Signed: 09/19/2022 3:55:23 PM By: Lenda Kelp PA-C Entered By: Elliot Gurney, BSN, RN, CWS, Kim on 09/19/2022 10:28:10 -------------------------------------------------------------------------------- HPI Details Patient Name: Date of Service: [name], [name] 09/19/2022 10:00 A M Medical Record Number: [id number] Patient Account Number: [id number] Date of Birth/Sex: Treating RN: 1965-12-08 (56 y.o. [name] Primary Care Provider: PA Zenovia Jordan, [place] Other Clinician: Referring Provider: Treating Provider/Extender: [name] in Treatment: 13 History of Present Illness HPI Description: Notes from [facility] under Dr. America Brown care:   ADMISSION6/15/2023This is a 56 year old poorly controlled type II diabetic (last A1c 11.5%). In January of this year, he presented to the hospital with an abscess in his left foot. He also had a foreign body present. It was removed in the ER and he was admitted to the hospital for IV antibiotics. He was subsequently discharged on oral antibiotics. Due to financial concerns, he has not taken his diabetic medications (metformin and 70/30 insulin) and as a result, presented back to the emergency room on June 1 with worsening findings, including frank pus and osteomyelitis on MRI. BKA was recommended, but he declined. He was taken to the operating room by Dr. Lajoyce Corners who performed an extensive debridement. Cultures were taken and he was placed on IV antibiotics. He saw infectious  disease while in the hospital who prescribed a 6-week course of IV antibiotics including cefazolin as well as oral metronidazole and levofloxacin. The infectious disease provider also indicated that he would benefit from below-knee amputation, but the patient desired another opinion and therefore he has been referred to wound care center for further evaluation and management. ABIs obtained while he was in the hospital demonstrate adequate  blood flow. Essentially the entire bottom of the patient's left foot has been removed. The heads of the majority of his metatarsal bones are exposed along with their accompanying tendons. Muscle and fat are exposed as well. The wound surface is fairly dry; the patient reports that he was not given any specific wound care instructions other than to place a dry dressing on it after showering. There is [name] odor or purulent drainage identified. 05/26/2022: The wound measures a little bit smaller today. There is some good granulation tissue beginning to form on the surface. He still has exposed bone and tendon. There is some slough accumulation. He saw infectious disease earlier this week and was frustrated that they recommended amputation. He is interested in another surgical opinion. He continues to smoke but says that he has cut down. He is using a knee scooter for mobility so that he can stay off of his foot. 06/05/2022: He saw Dr. Lajoyce Corners last week who was supportive of our efforts to try to salvage the foot. I referred him to vascular surgery to see Dr. Chestine Spore but he has not received an appointment yet. He continues on IV antibiotics. He says that after our conversation last week about him being unable to initiate hyperbarics if he was going to continue to smoke, he has not had a cigarette since. He is now at 4 weeks of conventional management and his insurance was initiated on July 1. Today, the wound continues to show evidence of improvement. There is good granulation  tissue forming. The metatarsals are still exposed but actually have some pink periosteum. There is still some slough and fibrinous exudate present. [name] odor or purulent drainage. We have just been using Dakin's dressing changes for now. Admission to Wound Care Center at [facility]: 06-15-2022 upon evaluation today patient presents for initial inspection here in our clinic though he has been seen by Dr. Lady Gary in [place] up to this point. I did review her notes and records as well they have been using Dakin's moistened gauze dressing which has done a great job at helping to clean out the bottom of his foot. Please see the discourse above for additional information in regard to treatment course up until the patient arrives here in the [place] clinic today. The patient is ready to get started with HBO therapy as soon as possible I do think based on all my review of his records this seems to be appropriate he does have chronic osteomyelitis is also been offered amputation this is obviously a limb salvage operation here. We are trying to prevent him from having to undergo an extensive amputation which would likely be a below-knee amputation which in turn is going to affect his quality of life he wants to try to avoid that if at all possible I think hyperbaric oxygen therapy is his best bet to achieve that goal. Currently he has been on IV Ancef. That actually ends tomorrow. Following that according to notes it appears he supposed to be taking Levaquin and Flagyl for an additional period of time although I am not certain exactly how long that with the. He has been seeing regional physicians infectious disease in [place] and again that so I recommended that he contact Stella, Lester ([id number]) [id number] Page 3 of 9 today in order to find out what the plan is going forward. His most recent hemoglobin A1c as noted above was 11.5 on 05-07-2022 he tells me trying to work on that he is  also quit smoking as  of this point. 06-22-2022 upon evaluation today patient appears to be doing well currently in regard to his wound all things considered. He is showing signs of a lot of new granulation tissue and overall very pleased. He is changing this once a day with the Dakin's moistened gauze dressing. Fortunately I do not see any evidence of infection locally or systemically at this time. We have been trying to get him into the hyperbaric chamber he was having a lot of issues with sinus pressure and we have made referral and called multiple ENT specialist the earliest we have been able to get his August 9 at [name] ear nose throat. They are going to try to work him in sooner if they have any opportunities to do so. Obviously I think the sooner he can do this the better. We need to get him in hyperbarics as quickly as possible. 7/26; patient presents for follow-up. He has been using Dakin's wet-to-dry dressings and oil emollient dressing over the exposed bone. He currently denies systemic signs of infection. He states he is scheduled to see infectious disease, Dr Thedore Mins next week. She had ordered an MRI of the left foot and he is scheduled to have this done tomorrow. He currently denies systemic signs of infection Including fever/chills, nausea/vomiting increased warmth or erythema to the wound bed or purulent drainage. He started hyperbaric oxygen treatment however did not tolerate this due to sinus pressure. He is scheduled to see ENT in 2 weeks for evaluation. He is not continuing HBO until he is cleared by ENT. 07-06-2022 patient presents today for follow-up concerning his plantar foot ulcer. The good news is this actually is significantly better compared to previous. Some of the bone which is necrotic is starting to slough off and I am going to perform debridement to clear this away today. Nonetheless I do think that the patient is going to need hyperbarics and we need to try to get it as  soon as possible. He still having a lot of sinus pressure I can even hear the congestion today. He is on 3 different antibiotics cefadroxil, metronidazole, and Levaquin currently for the osteomyelitis. For that reason this should actually help with the infection if he does have a chronic sinusitis I am also going to see about getting him on steroids to try to see if this can benefit him as well. He voiced understanding and he is in agreement with giving that shot. This is probably can to spike his blood sugars which I discussed with him he is going to be seeing his primary care provider later today I want him to let her know as well what is going on and why so that she could be aware but I really do think he needs to take the prednisone. We need to get him in the chamber as soon as possible to try to help with getting this wound to heal and closed so that he does not lose his foot. 07-13-2022 upon evaluation today patient appears to be doing better in regard to his wound he continues to show signs of new granulation am going to perform some debridement today but overall this is doing quite well. 07-20-2022 upon evaluation today patient's wound is actually showing some signs of improvement he does have 1 area on the fifth metatarsal region where there is some necrotic tendon that is noted at this point. Unfortunately this is preventing good granulation and over this point the rest of the metatarsal region is completely covered  over with granulation tissue which is great news there is some other slough and biofilm buildup that I will clearway as well. 07-27-2022 upon evaluation today patient appears to be doing well currently in regard to his plantar foot ulcer. He has been tolerating the dressing changes without complication fortunately. I do believe he is making excellent progress and each time I see him this is better and better is pretty much completely covered as far as any bone exposure is concerned  and he still is on the IV antibiotics were still also undergoing hyperbarics at this point that he just got started and is doing quite well with since getting the tubes in his ears.Marland Kitchen 08-03-2022 upon evaluation today patient appears to be doing well in general although has been having a lot of drainage compared to normal. It was actually leaking through his dressing today. He tells me he changed it last night. With that being said he has been up on his feet a lot more which I think is probably a big part of the issue here. Fortunately I do not see any signs of infection locally or systemically. 08-17-2022 upon evaluation today patient is doing well with regard to his wound this is actually measuring smaller which is good news. I am very pleased in that regard. With that being said he has not been participating HBO therapy as he really needs to afford to see this heal appropriately and completely. In fact he has only made 9 total HBO treatments in the past 4 weeks. Again this is not therapeutic and I discussed that with the patient. Nonetheless he is still improving and since the hyperbarics is not helping based on the discussion today the patient is going to discontinue HBO therapy at this point. He tells me that getting here 5 days a week just is not possible. He has had difficulty getting here and then also when he was here had difficulty with getting here at the right time which has also been part of the problem. Nonetheless either way I am pleased that he is doing better and my hope is that we will still be able to get him healed obviously he should continue with the antibiotics as well as the wound care as he has been doing this seems to be doing excellent for him but everything is improving quite nicely. 08-24-2022 upon evaluation today patient appears to be doing well currently in regard to his wound this is actually measuring significantly better even compared to last week. I am very pleased in  that regard I do not see any signs of infection and I think he is making excellent progress here. 08-31-2022 upon evaluation today patient's wound is actually showing signs of significant improvement. I am actually very pleased with where things stand currently. There does not appear to be any evidence of active infection locally or systemically at this time. [name] fevers, chills, nausea, vomiting, or diarrhea. 09-19-2022 upon evaluation patient's wound is actually showing signs of improvement though it still having quite a bit of drainage. I do believe he would benefit from switching over to [facility]. I discussed that with him today. He is definitely in agreement with that plan. Electronic Signature(s) Signed: 09/19/2022 3:10:18 PM By: Lenda Kelp PA-C Entered By: Lenda Kelp on 09/19/2022 15:10:17 -------------------------------------------------------------------------------- Physical Exam Details Patient Name: Date of Service: [name], [name] 09/19/2022 10:00 A M Medical Record Number: [id number] Patient Account Number: [id number] Date of Birth/Sex: Treating RN: [birth year] (56 y.o. M)  Huel Coventry Primary Care Provider: PA Zenovia Jordan, [place] Other Clinician: Referring Provider: Treating Provider/Extender: [name] in Treatment: [address], [place] ([id number]) [id number] Page 4 of 9 Constitutional Well-nourished and well-hydrated in [name] acute distress. Respiratory normal breathing without difficulty. Psychiatric this patient is able to make decisions and demonstrates good insight into disease process. Alert and Oriented x 3. pleasant and cooperative. Notes Slough and biofilm from the surface of the wound down to good subcutaneous tissue he tolerated that without complication and postdebridement the wound bed is significantly improved. Patient's wound did require some sharp debridement clearway the necrotic debris Electronic  Signature(s) Signed: 09/19/2022 3:10:38 PM By: Lenda Kelp PA-C Entered By: Lenda Kelp on 09/19/2022 15:10:38 -------------------------------------------------------------------------------- Physician Orders Details Patient Name: Date of Service: [name], [name] 09/19/2022 10:00 A M Medical Record Number: [id number] Patient Account Number: [id number] Date of Birth/Sex: Treating RN: Jul 19, 1966 (56 y.o. [name] Primary Care Provider: PA [name], [name] Other Clinician: Referring Provider: Treating Provider/Extender: [name] in Treatment: 13 Verbal / Phone Orders: [name] Diagnosis Coding ICD-10 Coding Code Description [phone] Other chronic osteomyelitis, left ankle and foot E11.621 Type 2 diabetes mellitus with foot ulcer L97.524 Non-pressure chronic ulcer of other part of left foot with necrosis of bone Follow-up Appointments Return Appointment in 1 week. Bathing/ Shower/ Hygiene May shower; gently cleanse wound with antibacterial soap, rinse and pat dry prior to dressing wounds Anesthetic (Use 'Patient Medications' Section for Anesthetic Order Entry) Lidocaine applied to wound bed Edema Control - Lymphedema / Segmental Compressive Device / Other Elevate, Exercise Daily and A void Standing for Long Periods of Time. Elevate legs to the level of the heart and pump ankles as often as possible Elevate leg(s) parallel to the floor when sitting. Wound Treatment Wound #1 - Foot Wound Laterality: Plantar, Left Prim Dressing: Hydrofera Blue Ready Transfer Foam, 4x5 (in/in) (DME) (Dispense As Written) 1 x Per Day/30 Days ary Discharge Instructions: Apply Hydrofera Blue Ready to wound bed as directed Secondary Dressing: ABD Pad 5x9 (in/in) (DME) (Generic) 1 x Per Day/30 Days Discharge Instructions: Cover with ABD pad Secondary Dressing: Kerlix 4.5 x 4.1 (in/yd) (DME) (Generic) 1 x Per Day/30 Days Discharge Instructions: Apply Kerlix 4.5 x 4.1 (in/yd) as  instructed AGOSTO, Raquel ([id number]) [id number] Page 5 of 9 Secured With: Medipore T - 73M Medipore H Soft Cloth Surgical T ape ape, 2x2 (in/yd) (DME) (Generic) 1 x Per Day/30 Days Electronic Signature(s) Signed: 09/19/2022 2:48:39 PM By: Elliot Gurney, BSN, RN, CWS, Kim RN, BSN Signed: 09/19/2022 3:55:23 PM By: Lenda Kelp PA-C Entered By: Elliot Gurney, BSN, RN, CWS, Kim on 09/19/2022 10:31:08 -------------------------------------------------------------------------------- Problem List Details Patient Name: Date of Service: [name], [name] 09/19/2022 10:00 A M Medical Record Number: [id number] Patient Account Number: [id number] Date of Birth/Sex: Treating RN: 1966-11-21 (56 y.o. [name] Primary Care Provider: PA Zenovia Jordan, [place] Other Clinician: Referring Provider: Treating Provider/Extender: [name] in Treatment: 13 Active Problems ICD-10 Encounter Code Description Active Date MDM Diagnosis [phone] Other chronic osteomyelitis, left ankle and foot 06/15/2022 [name] Yes E11.621 Type 2 diabetes mellitus with foot ulcer 06/15/2022 [name] Yes L97.524 Non-pressure chronic ulcer of other part of left foot with necrosis of bone 06/15/2022 [name] Yes Inactive Problems Resolved Problems Electronic Signature(s) Signed: 09/19/2022 10:25:18 AM By: Lenda Kelp PA-C Entered By: Lenda Kelp on 09/19/2022 10:25:18 Progress Note Details -------------------------------------------------------------------------------- [name] ([id number]) [id number] Page 6 of 9 Patient Name: Date of Service: [name],  [name] 09/19/2022 10:00 A M Medical Record Number: [id number] Patient Account Number: [id number] Date of Birth/Sex: Treating RN: Jan 06, 1966 (56 y.o. 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 America Brown care:   ADMISSION6/15/2023This is a 56 year old poorly controlled type II diabetic (last A1c 11.5%). In January of this year, he presented to the hospital with an abscess in his left foot. He also had a foreign body present. It was removed in the ER and he was admitted to the hospital for IV antibiotics. He was subsequently discharged on oral antibiotics. Due to financial concerns, he has not taken his diabetic medications (metformin and 70/30 insulin) and as a result, presented back to the emergency room on June 1 with worsening findings, including frank pus and osteomyelitis on MRI. BKA was recommended, but he declined. He was taken to the operating room by Dr. Lajoyce Corners who performed an extensive debridement. Cultures were taken and he was placed on IV antibiotics. He saw infectious disease while in the hospital who prescribed a 6-week course of IV antibiotics including cefazolin as well as oral metronidazole and levofloxacin. The infectious disease provider also indicated that he would benefit from below-knee amputation, but the patient desired another opinion and therefore he has been referred to wound care center for further evaluation and management. ABIs obtained while he was in the hospital demonstrate adequate blood flow. Essentially the entire bottom of the patient's left foot has been removed. The heads of the majority of his metatarsal bones are exposed along with their accompanying tendons. Muscle and fat are exposed as well. The wound surface is fairly dry; the patient reports that he was not given any specific wound care instructions other than to place a dry dressing on it after showering. There is [name] odor or purulent drainage identified. 05/26/2022: The wound measures a little bit  smaller today. There is some good granulation tissue beginning to form on the surface. He still has exposed bone and tendon. There is some slough accumulation. He saw infectious disease earlier this week and was frustrated that they recommended amputation. He is interested in another surgical opinion. He continues to smoke but says that he has cut down. He is using a knee scooter for mobility so that he can stay off of his foot. 06/05/2022: He saw Dr. Lajoyce Corners last week who was supportive of our efforts to try to salvage the foot. I referred him to vascular surgery to see Dr. Chestine Spore but he has not received an appointment yet. He continues on IV antibiotics. He says that after our conversation last week about him being unable to initiate hyperbarics if he was going to continue to smoke, he has not had a cigarette since. He is now at 4 weeks of conventional management and his insurance was initiated on July 1. Today, the wound continues to show evidence of improvement. There is good granulation tissue forming. The metatarsals are still exposed but actually have some pink periosteum. There is still some slough and fibrinous exudate present. [name] odor or purulent drainage. We have just been using Dakin's dressing changes for now. Admission to Wound Care Center at [facility]: 06-15-2022 upon evaluation today patient presents for initial inspection here in our clinic though he has been seen by Dr.  Cannon in [place] up to this point. I did review her notes and records as well they have been using Dakin's moistened gauze dressing which has done a great job at helping to clean out the bottom of his foot. Please see the discourse above for additional information in regard to treatment course up until the patient arrives here in the [place] clinic today. The patient is ready to get started with HBO therapy as soon as possible I do think based on all my review of his records this seems to be appropriate he does have  chronic osteomyelitis is also been offered amputation this is obviously a limb salvage operation here. We are trying to prevent him from having to undergo an extensive amputation which would likely be a below-knee amputation which in turn is going to affect his quality of life he wants to try to avoid that if at all possible I think hyperbaric oxygen therapy is his best bet to achieve that goal. Currently he has been on IV Ancef. That actually ends tomorrow. Following that according to notes it appears he supposed to be taking Levaquin and Flagyl for an additional period of time although I am not certain exactly how long that with the. He has been seeing regional physicians infectious disease in [place] and again that so I recommended that he contact today in order to find out what the plan is going forward. His most recent hemoglobin A1c as noted above was 11.5 on 05-07-2022 he tells me trying to work on that he is also quit smoking as of this point. 06-22-2022 upon evaluation today patient appears to be doing well currently in regard to his wound all things considered. He is showing signs of a lot of new granulation tissue and overall very pleased. He is changing this once a day with the Dakin's moistened gauze dressing. Fortunately I do not see any evidence of infection locally or systemically at this time. We have been trying to get him into the hyperbaric chamber he was having a lot of issues with sinus pressure and we have made referral and called multiple ENT specialist the earliest we have been able to get his August 9 at [facility] ear nose throat. They are going to try to work him in sooner if they have any opportunities to do so. Obviously I think the sooner he can do this the better. We need to get him in hyperbarics as quickly as possible. 7/26; patient presents for follow-up. He has been using Dakin's wet-to-dry dressings and oil emollient dressing over the exposed bone. He currently  denies systemic signs of infection. He states he is scheduled to see infectious disease, Dr Thedore Mins next week. She had ordered an MRI of the left foot and he is scheduled to have this done tomorrow. He currently denies systemic signs of infection Including fever/chills, nausea/vomiting increased warmth or erythema to the wound bed or purulent drainage. He started hyperbaric oxygen treatment however did not tolerate this due to sinus pressure. He is scheduled to see ENT in 2 weeks for evaluation. He is not continuing HBO until he is cleared by ENT. 07-06-2022 patient presents today for follow-up concerning his plantar foot ulcer. The good news is this actually is significantly better compared to previous. Some of the bone which is necrotic is starting to slough off and I am going to perform debridement to clear this away today. Nonetheless I do think that the patient is going to need hyperbarics and we need to try  to get it as soon as possible. He still having a lot of sinus pressure I can even hear the congestion today. He is on 3 different antibiotics cefadroxil, metronidazole, and Levaquin currently for the osteomyelitis. For that reason this should actually help with the infection if he does have a chronic sinusitis I am also going to see about getting him on steroids to try to see if this can benefit him as well. He voiced understanding and he is in agreement with giving that shot. This is probably can to spike his blood sugars which I discussed with him he is going to be seeing his primary care provider later today I want him to let her know as well what is going on and why so that she could be aware but I really do think he needs to take the prednisone. We need to get him in the chamber as soon as possible to try to help with getting this wound to heal and closed so that he does not lose his foot. 07-13-2022 upon evaluation today patient appears to be doing better in regard to his wound he continues  to show signs of new granulation am going to perform some debridement today but overall this is doing quite well. 07-20-2022 upon evaluation today patient's wound is actually showing some signs of improvement he does have 1 area on the fifth metatarsal region where there is some necrotic tendon that is noted at this point. Unfortunately this is preventing good granulation and over this point the rest of the metatarsal region is completely covered over with granulation tissue which is great news there is some other slough and biofilm buildup that I will clearway as well. 07-27-2022 upon evaluation today patient appears to be doing well currently in regard to his plantar foot ulcer. He has been tolerating the dressing changes without complication fortunately. I do believe he is making excellent progress and each time I see him this is better and better is pretty much completely [name], [name] ([id number]) [id number] Page 7 of 9 covered as far as any bone exposure is concerned and he still is on the IV antibiotics were still also undergoing hyperbarics at this point that he just got started and is doing quite well with since getting the tubes in his ears.Marland Kitchen 08-03-2022 upon evaluation today patient appears to be doing well in general although has been having a lot of drainage compared to normal. It was actually leaking through his dressing today. He tells me he changed it last night. With that being said he has been up on his feet a lot more which I think is probably a big part of the issue here. Fortunately I do not see any signs of infection locally or systemically. 08-17-2022 upon evaluation today patient is doing well with regard to his wound this is actually measuring smaller which is good news. I am very pleased in that regard. With that being said he has not been participating HBO therapy as he really needs to afford to see this heal appropriately and completely. In fact he has  only made 9 total HBO treatments in the past 4 weeks. Again this is not therapeutic and I discussed that with the patient. Nonetheless he is still improving and since the hyperbarics is not helping based on the discussion today the patient is going to discontinue HBO therapy at this point. He tells me that getting here 5 days a week just is not possible. He has had difficulty getting here and  then also when he was here had difficulty with getting here at the right time which has also been part of the problem. Nonetheless either way I am pleased that he is doing better and my hope is that we will still be able to get him healed obviously he should continue with the antibiotics as well as the wound care as he has been doing this seems to be doing excellent for him but everything is improving quite nicely. 08-24-2022 upon evaluation today patient appears to be doing well currently in regard to his wound this is actually measuring significantly better even compared to last week. I am very pleased in that regard I do not see any signs of infection and I think he is making excellent progress here. 08-31-2022 upon evaluation today patient's wound is actually showing signs of significant improvement. I am actually very pleased with where things stand currently. There does not appear to be any evidence of active infection locally or systemically at this time. [name] fevers, chills, nausea, vomiting, or diarrhea. 09-19-2022 upon evaluation patient's wound is actually showing signs of improvement though it still having quite a bit of drainage. I do believe he would benefit from switching over to [facility]. I discussed that with him today. He is definitely in agreement with that plan. Objective Constitutional Well-nourished and well-hydrated in [name] acute distress. Vitals Time Taken: 10:05 AM, Height: 70 in, Weight: 230 lbs, BMI: 33, Temperature: 98.1 F, Pulse: 97 bpm, Respiratory Rate: 16 breaths/min, Blood  Pressure: 127/83 mmHg. Respiratory normal breathing without difficulty. Psychiatric this patient is able to make decisions and demonstrates good insight into disease process. Alert and Oriented x 3. pleasant and cooperative. General Notes: Slough and biofilm from the surface of the wound down to good subcutaneous tissue he tolerated that without complication and postdebridement the wound bed is significantly improved. Patient's wound did require some sharp debridement clearway the necrotic debris Integumentary (Hair, Skin) Wound #1 status is Open. Original cause of wound was Gradually Appeared. The date acquired was: 12/04/2021. The wound has been in treatment 13 weeks. The wound is located on the [place]. The wound measures 8.5cm length x 7cm width x 0.2cm depth; 46.731cm^2 area and 9.346cm^3 volume. There is Fat Layer (Subcutaneous Tissue) exposed. There is [name] tunneling or undermining noted. There is a large amount of serosanguineous drainage noted. There is medium (34-66%) pink, hyper - granulation within the wound bed. There is a medium (34-66%) amount of necrotic tissue within the wound bed including Adherent Slough. Assessment Active Problems ICD-10 Other chronic osteomyelitis, left ankle and foot Type 2 diabetes mellitus with foot ulcer Non-pressure chronic ulcer of other part of left foot with necrosis of bone Procedures Wound #1 Pre-procedure diagnosis of Wound #1 is a Diabetic Wound/Ulcer of the Lower Extremity located on the [place] .Severity of Tissue Pre Debridement is: Fat layer exposed. There was a Excisional Skin/Subcutaneous Tissue Debridement with a total area of 59.5 sq cm performed by [name]., PA-C. With the following instrument(s): Curette to remove Viable and Non-Viable tissue/material. Material removed includes Subcutaneous Tissue, Slough, and Biofilm. [name] specimens were taken. A time out was conducted at 10:27, prior to the start of the  procedure. A Minimum amount of bleeding was controlled with Pressure. The procedure was tolerated well. Post Debridement Measurements: 8.5cm length x 7cm width x 0.3cm depth; 14.019cm^3 volume. Character of Wound/Ulcer Post Debridement is stable. Severity of Tissue Post Debridement is: Fat layer exposed. Post procedure Diagnosis Wound #1:  Same as Pre-Procedure Matthew JanskyMARTIN, Ryshawn ([id number]) [id number] Page 8 of 9 Plan Follow-up Appointments: Return Appointment in 1 week. Bathing/ Shower/ Hygiene: May shower; gently cleanse wound with antibacterial soap, rinse and pat dry prior to dressing wounds Anesthetic (Use 'Patient Medications' Section for Anesthetic Order Entry): Lidocaine applied to wound bed Edema Control - Lymphedema / Segmental Compressive Device / Other: Elevate, Exercise Daily and Avoid Standing for Long Periods of Time. Elevate legs to the level of the heart and pump ankles as often as possible Elevate leg(s) parallel to the floor when sitting. WOUND #1: - Foot Wound Laterality: Plantar, Left Prim Dressing: Hydrofera Blue Ready Transfer Foam, 4x5 (in/in) (DME) (Dispense As Written) 1 x Per Day/30 Days ary Discharge Instructions: Apply Hydrofera Blue Ready to wound bed as directed Secondary Dressing: ABD Pad 5x9 (in/in) (DME) (Generic) 1 x Per Day/30 Days Discharge Instructions: Cover with ABD pad Secondary Dressing: Kerlix 4.5 x 4.1 (in/yd) (DME) (Generic) 1 x Per Day/30 Days Discharge Instructions: Apply Kerlix 4.5 x 4.1 (in/yd) as instructed Secured With: Medipore T - 58M Medipore H Soft Cloth Surgical T ape ape, 2x2 (in/yd) (DME) (Generic) 1 x Per Day/30 Days 1. I am going to recommend that we have the patient continue to monitor for any signs of worsening or infection. Obviously if anything changes he knows he can definitely contact the office and let me know. 2. I am also can recommend that we have the patient continue with the dressing changes that  he will be doing this every other day with [facility] I think this is really a much better option. 3 we will continue with ABD pad followed along with the Kerlix to secure in place. We will see patient back for reevaluation in 1 week here in the clinic. If anything worsens or changes patient will contact our office for additional recommendations. Electronic Signature(s) Signed: 09/19/2022 3:11:17 PM By: Lenda KelpStone III, Catilyn Boggus PA-C Entered By: Lenda KelpStone III, Janaiyah Blackard on 09/19/2022 15:11:17 -------------------------------------------------------------------------------- SuperBill Details Patient Name: Date of Service: Matthew Lanny HurstRTIN, Shiquan 09/19/2022 Medical Record Number: [id number] Patient Account Number: [id number] Date of Birth/Sex: Treating RN: 12/28/1965 (56 y.o. Arthur HolmsM) Woody, Kim Primary Care Provider: PA [name], [place] Other Clinician: Referring Provider: Treating Provider/Extender: Matthew PumaStone, Jaysion Ramseyer Cannon, Jennifer Weeks in Treatment: 13 Diagnosis Coding ICD-10 Codes Code Description [id number] Other chronic osteomyelitis, left ankle and foot E11.621 Type 2 diabetes mellitus with foot ulcer L97.524 Non-pressure chronic ulcer of other part of left foot with necrosis of bone Facility Procedures : Vanice SarahMARTIN, L CPT4 Code: [id number] EE ([id number]) [id number] 11 I Description: 11042 - DEB SUBQ TISSUE 20 SQ CM/< ICD-10 Diagnosis Description L97.524 Non-pressure chronic ulcer of other part of left foot with necrosis of bone [id number] - DEB SUBQ TISS EA ADDL 20CM CD-10 Diagnosis Description L97.524  Non-pressure chronic ulcer of other part of left foot with necrosis of bone Modifier: [id number] Pa 2 Quantity: 1 ge 9 of 9 Physician Procedures : CPT4 Code Description Modifier [id number] 11042 - WC PHYS SUBQ TISS 20 SQ CM ICD-10 Diagnosis Description L97.524 Non-pressure chronic ulcer of other part of left foot with necrosis of bone Quantity: 1 : [id number] 11045 - WC PHYS SUBQ TISS EA ADDL 20 CM ICD-10  Diagnosis Description L97.524 Non-pressure chronic ulcer of other part of left foot with necrosis of bone Quantity: 2 Electronic Signature(s) Signed: 09/19/2022 3:11:41 PM By: Lenda KelpStone III, Marcelis Wissner PA-C Entered By: Lenda KelpStone III, Jorden Mahl on 09/19/2022 15:11:40

## 2022-09-24 DIAGNOSIS — E11621 Type 2 diabetes mellitus with foot ulcer: Secondary | ICD-10-CM | POA: Diagnosis not present

## 2022-09-28 ENCOUNTER — Encounter: Payer: 59 | Admitting: Physician Assistant

## 2022-09-28 DIAGNOSIS — R69 Illness, unspecified: Secondary | ICD-10-CM | POA: Diagnosis not present

## 2022-09-28 DIAGNOSIS — E11621 Type 2 diabetes mellitus with foot ulcer: Secondary | ICD-10-CM | POA: Diagnosis not present

## 2022-09-28 DIAGNOSIS — M86672 Other chronic osteomyelitis, left ankle and foot: Secondary | ICD-10-CM | POA: Diagnosis not present

## 2022-09-28 DIAGNOSIS — E1169 Type 2 diabetes mellitus with other specified complication: Secondary | ICD-10-CM | POA: Diagnosis not present

## 2022-09-28 DIAGNOSIS — L97522 Non-pressure chronic ulcer of other part of left foot with fat layer exposed: Secondary | ICD-10-CM | POA: Diagnosis not present

## 2022-09-28 DIAGNOSIS — L97524 Non-pressure chronic ulcer of other part of left foot with necrosis of bone: Secondary | ICD-10-CM | POA: Diagnosis not present

## 2022-09-28 NOTE — Progress Notes (Signed)
[name], [name] ([id number]) [id number] Page 1 of 9 Visit Report for 09/28/2022 Arrival Information Details Patient Name: Date of Service: [name], [name] 09/28/2022 2:30 PM Medical Record Number: [id number] Patient Account Number: [id number] Date of Birth/Sex: Treating RN: 01-02-1966 (56 y.o. Seward Meth Primary Care Taequan Stockhausen: PA Haig Prophet, NO Other Clinician: Referring Telicia Hodgkiss: Treating Leonidas Boateng/Extender: Cloretta Ned in Treatment: 15 Visit Information History Since Last Visit All ordered tests and consults were completed: No Patient Arrived: Knee Scooter Added or deleted any medications: No Arrival Time: 14:44 Any new allergies or adverse reactions: No Accompanied By: self Had a fall or experienced change in No Transfer Assistance: None activities of daily living that may affect Patient Identification Verified: Yes risk of falls: Secondary Verification Process Completed: Yes Hospitalized since last visit: No Patient Requires Transmission-Based Precautions: No Pain Present Now: No Patient Has Alerts: No Electronic Signature(s) Signed: 09/28/2022 4:52:35 PM By: Rosalio Loud MSN RN CNS WTA Entered By: Rosalio Loud on 09/28/2022 14:46:07 -------------------------------------------------------------------------------- Clinic Level of Care Assessment Details Patient Name: Date of Service: [name], [name] 09/28/2022 2:30 PM Medical Record Number: [id number] Patient Account Number: [id number] Date of Birth/Sex: Treating RN: 1966-06-24 (56 y.o. Seward Meth Primary Care Grayling Schranz: PA Haig Prophet, NO Other Clinician: Referring Marvelous Bouwens: Treating Zasha Belleau/Extender: Cloretta Ned in Treatment: 15 Clinic Level of Care Assessment Items TOOL 1 Quantity Score _0  - 0 Use when EandM and Procedure is performed on INITIAL visit ASSESSMENTS - Nursing Assessment / Reassessment _1  - 0 General Physical Exam (combine w/ comprehensive  assessment (listed just below) when performed on new pt. evals) _2  - 0 Comprehensive Assessment (HX, ROS, Risk Assessments, Wounds Hx, etc.) ASSESSMENTS - Wound and Skin Assessment / Reassessment _3  - 0 Dermatologic / Skin Assessment (not related to wound area) ASSESSMENTS - Ostomy and/or Continence Assessment and [place], Ismael ([id number]) [id number] Page 2 of 9 _4  - 0 Incontinence Assessment and Management _5  - 0 Ostomy Care Assessment and Management (repouching, etc.) PROCESS - Coordination of Care _6  - 0 Simple Patient / Family Education for ongoing care _7  - 0 Complex (extensive) Patient / Family Education for ongoing care _8  - 0 Staff obtains Programmer, systems, Records, T Results / Process Orders est _9  - 0 Staff telephones HHA, Nursing Homes / Clarify orders / etc _10  - 0 Routine Transfer to another Facility (non-emergent condition) _11  - 0 Routine Hospital Admission (non-emergent condition) _12  - 0 New Admissions / Biomedical engineer / Ordering NPWT Apligraf, etc. , _13  - 0 Emergency Hospital Admission (emergent condition) PROCESS - Special Needs _14  - 0 Pediatric / Minor Patient Management _15  - 0 Isolation Patient Management _16  - 0 Hearing / Language / Visual special needs _17  - 0 Assessment of Community assistance (transportation, D/C planning, etc.) _18  - 0 Additional assistance / Altered mentation _19  - 0 Support Surface(s) Assessment (bed, cushion, seat, etc.) INTERVENTIONS - Miscellaneous _20  - 0 External ear exam _21  - 0 Patient Transfer (multiple staff / Civil Service fast streamer / Similar devices) _22  - 0 Simple Staple / Suture removal (25 or less) _23  - 0 Complex Staple / Suture removal (26 or more) _24  - 0 Hypo/Hyperglycemic Management (do not check if billed separately) _25  - 0 Ankle / Brachial Index (ABI) - do not check if billed separately Has the patient been seen at the hospital within the last three years: Yes Total Score: 0 Level Of  Care: ____ Electronic Signature(s) Signed: 09/28/2022 4:52:35 PM By: Rosalio Loud MSN RN CNS WTA Entered By: Tamala Julian,  Jocelyn Lamer on 09/28/2022 15:09:17 -------------------------------------------------------------------------------- Encounter Discharge Information Details Patient Name: Date of Service: Matthew DAMONTA, [name] 09/28/2022 2:30 PM Medical Record Number: [id number] Patient Account Number: [id number] Date of Birth/Sex: Treating RN: Nov 04, 1966 (56 y.o. Seward Meth Primary Care Thuan Tippett: PA Haig Prophet, NO Other Clinician: Referring Jushua Waltman: Treating Damontre Millea/Extender: Cloretta Ned in Treatment: 15 Encounter Discharge Information Items Post Procedure Vitals Discharge Condition: Stable Temperature (F): 98.2 Poplar, Sara ([id number]) [id number] Page 3 of 9 Ambulatory Status: Knee Scooter Pulse (bpm): 105 Discharge Destination: Home Respiratory Rate (breaths/min): 16 Transportation: Private Auto Blood Pressure (mmHg): 113/77 Accompanied By: self Schedule Follow-up Appointment: Yes Clinical Summary of Care: Provided on 09/28/2022 Form Type Recipient Paper Patient patient Electronic Signature(s) Signed: 09/28/2022 4:52:35 PM By: Rosalio Loud MSN RN CNS WTA Entered By: Rosalio Loud on 09/28/2022 15:12:16 -------------------------------------------------------------------------------- Lower Extremity Assessment Details Patient Name: Date of Service: [name], ESHELMAN 09/28/2022 2:30 PM Medical Record Number: [id number] Patient Account Number: [id number] Date of Birth/Sex: Treating RN: 09-05-1966 (56 y.o. Seward Meth Primary Care Bethanne Mule: PA Haig Prophet, NO Other Clinician: Referring Josephine Rudnick: Treating Robinette Esters/Extender: Cloretta Ned in Treatment: 15 Vascular Assessment Pulses: Dorsalis Pedis Palpable: [Left:Yes] Electronic Signature(s) Signed: 09/28/2022 4:52:35 PM By: Rosalio Loud MSN RN CNS WTA Entered By: Rosalio Loud  on 09/28/2022 14:59:05 -------------------------------------------------------------------------------- Multi Wound Chart Details Patient Name: Date of Service: [name], [name] 09/28/2022 2:30 PM Medical Record Number: [id number] Patient Account Number: [id number] Date of Birth/Sex: Treating RN: [birth year] (56 y.o. Seward Meth Primary Care Janyla Biscoe: PA Haig Prophet, NO Other Clinician: Referring Demetrica Zipp: Treating Natalyn Szymanowski/Extender: Cloretta Ned in Treatment: 15 Vital Signs Height(in): 70 Pulse(bpm): 105 Weight(lbs): 230 Blood Pressure(mmHg): 113/77 Body Mass Index(BMI): 33 Temperature(F): 98.2 Severin, Klayten ([id number]) [id number] Page 4 of 9 Respiratory Rate(breaths/min): 16 [1:Photos:] [N/A:N/A] Left, Plantar Foot N/A N/A Wound Location: Gradually Appeared N/A N/A Wounding Event: Diabetic Wound/Ulcer of the Lower N/A N/A Primary Etiology: Extremity Type II Diabetes N/A N/A Comorbid History: 12/04/2021 N/A N/A Date Acquired: 15 N/A N/A Weeks of Treatment: Open N/A N/A Wound Status: No N/A N/A Wound Recurrence: Yes N/A N/A Pending A mputation on Presentation: 7x6.7x0.4 N/A N/A Measurements L x W x D (cm) 36.835 N/A N/A A (cm) : rea 14.734 N/A N/A Volume (cm) : 63.40% N/A N/A % Reduction in A rea: 70.70% N/A N/A % Reduction in Volume: Grade 4 N/A N/A Classification: Large N/A N/A Exudate A mount: Serosanguineous N/A N/A Exudate Type: red, brown N/A N/A Exudate Color: Yes N/A N/A Foul Odor A Cleansing: fter No N/A N/A Odor A nticipated Due to Product Use: Medium (34-66%) N/A N/A Granulation A mount: Pink, Hyper-granulation N/A N/A Granulation Quality: Medium (34-66%) N/A N/A Necrotic A mount: Fat Layer (Subcutaneous Tissue): Yes N/A N/A Exposed Structures: Fascia: No Tendon: No Muscle: No Joint: No Bone: No None N/A N/A Epithelialization: Treatment Notes Electronic Signature(s) Signed:  09/28/2022 4:52:35 PM By: Rosalio Loud MSN RN CNS WTA Entered By: Rosalio Loud on 09/28/2022 15:03:45 -------------------------------------------------------------------------------- Multi-Disciplinary Care Plan Details Patient Name: Date of Service: [name], [name] 09/28/2022 2:30 PM Medical Record Number: [id number] Patient Account Number: [id number] Date of Birth/Sex: Treating RN: 1966/09/10 (56 y.o. Seward Meth Primary Care Keaton Stirewalt: PA Haig Prophet, NO Other Clinician: Referring Norie Latendresse: Treating Odena Mcquaid/Extender: Cloretta Ned in Treatment: [address] [place] ([id number]) [id number] Page 5 of 9 Active Inactive Osteomyelitis Nursing Diagnoses: Infection: osteomyelitis Knowledge deficit related to disease process and management Potential for infection: osteomyelitis Goals: Diagnostic evaluation for osteomyelitis completed  as ordered Date Initiated: 06/28/2022 Date Inactivated: 08/03/2022 Target Resolution Date: 06/28/2022 Goal Status: Met Patient/caregiver will verbalize understanding of disease process and disease management Date Initiated: 06/28/2022 Date Inactivated: 08/31/2022 Target Resolution Date: 06/28/2022 Goal Status: Met Patient's osteomyelitis will resolve Date Initiated: 06/28/2022 Target Resolution Date: 06/28/2022 Goal Status: Active Signs and symptoms for osteomyelitis will be recognized and promptly addressed Date Initiated: 06/28/2022 Date Inactivated: 08/03/2022 Target Resolution Date: 06/28/2022 Goal Status: Met Interventions: Assess for signs and symptoms of osteomyelitis resolution every visit Provide education on osteomyelitis Screen for HBO Notes: Wound/Skin Impairment Nursing Diagnoses: Knowledge deficit related to ulceration/compromised skin integrity Goals: Patient/caregiver will verbalize understanding of skin care regimen Date Initiated: 06/15/2022 Date Inactivated: 08/03/2022 Target Resolution Date:  07/16/2022 Goal Status: Met Ulcer/skin breakdown will have a volume reduction of 30% by week 4 Date Initiated: 06/15/2022 Date Inactivated: 08/03/2022 Target Resolution Date: 07/16/2022 Goal Status: Unmet Unmet Reason: comorbities Ulcer/skin breakdown will have a volume reduction of 50% by week 8 Date Initiated: 06/15/2022 Date Inactivated: 08/31/2022 Target Resolution Date: 08/16/2022 Goal Status: Unmet Unmet Reason: comorbdities Ulcer/skin breakdown will have a volume reduction of 80% by week 12 Date Initiated: 06/15/2022 Target Resolution Date: 09/15/2022 Goal Status: Active Ulcer/skin breakdown will heal within 14 weeks Date Initiated: 06/15/2022 Target Resolution Date: 10/16/2022 Goal Status: Active Interventions: Assess patient/caregiver ability to obtain necessary supplies Assess patient/caregiver ability to perform ulcer/skin care regimen upon admission and as needed Assess ulceration(s) every visit Notes: Electronic Signature(s) Signed: 09/28/2022 4:52:35 PM By: Rosalio Loud MSN RN CNS WTA Entered By: Rosalio Loud on 09/28/2022 14:59:11 Cement, Truman Hayward ([id number]) [id number] Page 6 of 9 -------------------------------------------------------------------------------- Pain Assessment Details Patient Name: Date of Service: [name], [name] 09/28/2022 2:30 PM Medical Record Number: [id number] Patient Account Number: [id number] Date of Birth/Sex: Treating RN: July 12, 1966 (56 y.o. Seward Meth Primary Care Arnet Hofferber: PA Haig Prophet, NO Other Clinician: Referring Nixxon Faria: Treating Harold Moncus/Extender: Cloretta Ned in Treatment: 15 Active Problems Location of Pain Severity and Description of Pain Patient Has Paino No Site Locations Rate the pain. Current Pain Level: 0 Pain Management and Medication Current Pain Management: Electronic Signature(s) Signed: 09/28/2022 4:52:35 PM By: Rosalio Loud MSN RN CNS WTA Entered By: Rosalio Loud on  09/28/2022 14:48:20 -------------------------------------------------------------------------------- Patient/Caregiver Education Details Patient Name: Date of Service: [name], [name] 10/26/2023andnbsp2:30 PM Medical Record Number: [id number] Patient Account Number: [id number] Date of Birth/Gender: Treating RN: [birth year] (56 y.o. Seward Meth Primary Care Physician: PA Haig Prophet, [place] Other Clinician: Referring Physician: Treating Physician/Extender: Cloretta Ned in Treatment: [address] [place] ([id number]) [id number] Page 7 of 9 Education Assessment Education Provided To: Patient Education Topics Provided Infection: Handouts: Infection Prevention and Management Methods: Explain/Verbal Responses: State content correctly Wound/Skin Impairment: Handouts: Caring for Your Ulcer Methods: Explain/Verbal Responses: State content correctly Electronic Signature(s) Signed: 09/28/2022 4:52:35 PM By: Rosalio Loud MSN RN CNS WTA Entered By: Rosalio Loud on 09/28/2022 15:09:54 -------------------------------------------------------------------------------- Wound Assessment Details Patient Name: Date of Service: [name], [name] 09/28/2022 2:30 PM Medical Record Number: [id number] Patient Account Number: [id number] Date of Birth/Sex: Treating RN: Nov 22, 1966 (56 y.o. Seward Meth Primary Care Dera Vanaken: PA Haig Prophet, NO Other Clinician: Referring Ciarra Braddy: Treating Jazara Swiney/Extender: Cloretta Ned in Treatment: 15 Wound Status Wound Number: 1 Primary Etiology: Diabetic Wound/Ulcer of the Lower Extremity Wound Location: Left, Plantar Foot Wound Status: Open Wounding Event: Gradually Appeared Comorbid History: Type II Diabetes Date Acquired: 12/04/2021 Weeks Of Treatment: 15 Clustered Wound: No Pending Amputation On Presentation Photos Wound Measurements Length: (  cm) 7 Width: (cm) 6.7 Depth: (cm) 0.4 Area: (cm) 36.835 Volume:  (cm) 14.734 Boyack, Joas ([id number]) % Reduction in Area: 63.4% % Reduction in Volume: 70.7% Epithelialization: None [id number] Page 8 of 9 Wound Description Classification: Grade 4 Exudate Amount: Large Exudate Type: Serosanguineous Exudate Color: red, brown Foul Odor After Cleansing: Yes Due to Product Use: No Slough/Fibrino Yes Wound Bed Granulation Amount: Medium (34-66%) Exposed Structure Granulation Quality: Pink, Hyper-granulation Fascia Exposed: No Necrotic Amount: Medium (34-66%) Fat Layer (Subcutaneous Tissue) Exposed: Yes Necrotic Quality: Adherent Slough Tendon Exposed: No Muscle Exposed: No Joint Exposed: No Bone Exposed: No Treatment Notes Wound #1 (Foot) Wound Laterality: Plantar, Left Cleanser Peri-Wound Care Topical Primary Dressing Hydrofera Blue Ready Transfer Foam, 4x5 (in/in) Discharge Instruction: Apply Hydrofera Blue Ready to wound bed as directed Secondary Dressing ABD Pad 5x9 (in/in) Discharge Instruction: Cover with ABD pad Kerlix 4.5 x 4.1 (in/yd) Discharge Instruction: Apply Kerlix 4.5 x 4.1 (in/yd) as instructed Secured With Medipore T - 77M Medipore H Soft Cloth Surgical T ape ape, 2x2 (in/yd) Compression Wrap Compression Stockings Add-Ons Electronic Signature(s) Signed: 09/28/2022 4:52:35 PM By: Rosalio Loud MSN RN CNS WTA Entered By: Rosalio Loud on 09/28/2022 14:58:09 -------------------------------------------------------------------------------- Vitals Details Patient Name: Date of Service: [name], [name] 09/28/2022 2:30 PM Medical Record Number: [id number] Patient Account Number: [id number] Date of Birth/Sex: Treating RN: Sep 02, 1966 (56 y.o. Seward Meth Primary Care Keniel Ralston: PA Haig Prophet, NO Other Clinician: Referring Tywan Siever: Treating Alizah Sills/Extender: Cloretta Ned in Treatment: 15 Vital Signs Time Taken: 14:46 Temperature (F): 98.2 Height (in): 70 Pulse (bpm):  [place], [place] ([id number]) [id number] Page 9 of 9 Weight (lbs): 230 Respiratory Rate (breaths/min): 16 Body Mass Index (BMI): 33 Blood Pressure (mmHg): 113/77 Reference Range: 80 - 120 mg / dl Electronic Signature(s) Signed: 09/28/2022 4:52:35 PM By: Rosalio Loud MSN RN CNS WTA Entered By: Rosalio Loud on 09/28/2022 14:48:12

## 2022-09-28 NOTE — Progress Notes (Addendum)
[name], [name] ([id number]) [id number] Page 1 of 9 Visit Report for 09/28/2022 Chief Complaint Document Details Patient Name: Date of Service: [name], [name] 09/28/2022 2:30 PM Medical Record Number: [id number] Patient Account Number: [id number] Date of Birth/Sex: Treating RN: Nov 23, 1966 (56 y.o. 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 [name] Primary Care Provider: PA Zenovia Jordan, NO Other Clinician: Referring Provider: Treating Provider/Extender: Merrilyn Puma in Treatment: 15 Debridement Performed for Assessment: Wound #1 Left,Plantar Foot Performed By: Physician Nelida Meuse., PA-C Debridement Type: Debridement Severity of Tissue Pre Debridement: Fat layer exposed Level of Consciousness (Pre-procedure): Awake and Alert Pre-procedure Verification/Time Out Yes - 15:02 Taken: Start Time: 15:02 T Area Debrided (L x W): otal 7 (cm) x 6.7 (cm) = 46.9 (cm) Tissue and other material debrided: Viable, Non-Viable, Slough, Subcutaneous, Biofilm, Slough Level: Skin/Subcutaneous Tissue Debridement Description: Excisional Instrument: Curette Bleeding:  Moderate Hemostasis Achieved: Pressure End Time: 15:06 Response to Treatment: Procedure was tolerated well Level of Consciousness [name], [name] ([id number]) [id number] Page 2 of 9 Level of Consciousness (Post- Awake and Alert procedure): Post Debridement Measurements of Total Wound Length: (cm) 7 Width: (cm) 6.7 Depth: (cm) 0.5 Volume: (cm) 18.418 Character of Wound/Ulcer Post Debridement: Stable Severity of Tissue Post Debridement: Fat layer exposed Post Procedure Diagnosis Same as Pre-procedure Electronic Signature(s) Signed: 09/28/2022 4:52:35 PM By: Midge Aver MSN RN CNS WTA Signed: 09/28/2022 5:00:04 PM By: Lenda Kelp PA-C Entered By: Midge Aver on 09/28/2022 15:07:08 -------------------------------------------------------------------------------- HPI Details Patient Name: Date of Service: [name], [name] 09/28/2022 2:30 PM Medical Record Number: [id number] Patient Account Number: [id number] Date of Birth/Sex: Treating RN: Apr 09, 1966 (56 y.o. [name] Primary Care Provider: PA Zenovia Jordan, [place] Other Clinician: Referring Provider: Treating Provider/Extender: Merrilyn Puma in Treatment: 15 History of Present Illness HPI Description: Notes from [facility] under Dr. America Brown care:   ADMISSION6/15/2023This is a 56 year old poorly controlled type II diabetic (last A1c 11.5%). In January of this year, he presented to the hospital with an abscess in his left foot. He also had a foreign body present. It was removed in the ER and he was admitted to the hospital for IV antibiotics. He was subsequently discharged on oral antibiotics. Due to financial concerns, he has not taken his diabetic medications (metformin and 70/30 insulin) and as a result, presented back to the emergency room on June 1 with worsening findings, including frank pus and osteomyelitis on MRI. BKA was recommended, but he declined. He was  taken to the operating room by Dr. Lajoyce Corners who performed an extensive debridement. Cultures were taken and he was placed on IV antibiotics. He saw infectious disease while in the hospital who prescribed a 6-week course of IV antibiotics including cefazolin as well as oral metronidazole and levofloxacin. The infectious disease provider also indicated that he would benefit from below-knee amputation, but the patient desired another opinion and  therefore he has been referred to wound care center for further evaluation and management. ABIs obtained while he was in the hospital demonstrate adequate blood flow. Essentially the entire bottom of the patient's left foot has been removed. The heads of the majority of his metatarsal bones are exposed along with their accompanying tendons. Muscle and fat are exposed as well. The wound surface is fairly dry; the patient reports that he was not given any specific wound care instructions other than to place a dry dressing on it after showering. There is no odor or purulent drainage identified. 05/26/2022: The wound measures a little bit smaller today. There is some good granulation tissue beginning to form on the surface. He still has exposed bone and tendon. There is some slough accumulation. He saw infectious disease earlier this week and was frustrated that they recommended amputation. He is interested in another surgical opinion. He continues to smoke but says that he has cut down. He is using a knee scooter for mobility so that he can stay off of his foot. 06/05/2022: He saw Dr. Lajoyce Corners last week who was supportive of our efforts to try to salvage the foot. I referred him to vascular surgery to see Dr. Chestine Spore but he has not received an appointment yet. He continues on IV antibiotics. He says that after our conversation last week about him being unable to initiate hyperbarics if he was going to continue to smoke, he has not had a cigarette since. He is now at 4 weeks of  conventional management and his insurance was initiated on July 1. Today, the wound continues to show evidence of improvement. There is good granulation tissue forming. The metatarsals are still exposed but actually have some pink periosteum. There is still some slough and fibrinous exudate present. No odor or purulent drainage. We have just been using Dakin's dressing changes for now. Admission to Wound Care Center at [facility]: 06-15-2022 upon evaluation today patient presents for initial inspection here in our clinic though he has been seen by Dr. Lady Gary in [place] up to this point. I did review her notes and records as well they have been using Dakin's moistened gauze dressing which has done a great job at helping to clean out the bottom of his foot. Please see the discourse above for additional information in regard to treatment course up until the patient arrives here in the [place] clinic today. The patient is ready to get started with HBO therapy as soon as possible I do think based on all my review of his records this seems to be appropriate he does have chronic osteomyelitis is also been offered amputation this is obviously a limb salvage operation here. We are trying to prevent him [name], [name] ([id number]) [id number] Page 3 of 9 from having to undergo an extensive amputation which would likely be a below-knee amputation which in turn is going to affect his quality of life he wants to try to avoid that if at all possible I think hyperbaric oxygen therapy is his best bet to achieve that goal. Currently he has been on IV Ancef. That actually ends tomorrow. Following that according to notes it appears he supposed to be taking Levaquin and Flagyl for an additional period of time although I am not certain exactly how long that with the. He has been seeing regional physicians infectious disease in [place] and again that so I recommended that he contact today in order  to find out what the plan is going forward. His most  recent hemoglobin A1c as noted above was 11.5 on 05-07-2022 he tells me trying to work on that he is also quit smoking as of this point. 06-22-2022 upon evaluation today patient appears to be doing well currently in regard to his wound all things considered. He is showing signs of a lot of new granulation tissue and overall very pleased. He is changing this once a day with the Dakin's moistened gauze dressing. Fortunately I do not see any evidence of infection locally or systemically at this time. We have been trying to get him into the hyperbaric chamber he was having a lot of issues with sinus pressure and we have made referral and called multiple ENT specialist the earliest we have been able to get his August 9 at [facility] ear nose throat. They are going to try to work him in sooner if they have any opportunities to do so. Obviously I think the sooner he can do this the better. We need to get him in hyperbarics as quickly as possible. 7/26; patient presents for follow-up. He has been using Dakin's wet-to-dry dressings and oil emollient dressing over the exposed bone. He currently denies systemic signs of infection. He states he is scheduled to see infectious disease, Dr Candiss Norse next week. She had ordered an MRI of the left foot and he is scheduled to have this done tomorrow. He currently denies systemic signs of infection Including fever/chills, nausea/vomiting increased warmth or erythema to the wound bed or purulent drainage. He started hyperbaric oxygen treatment however did not tolerate this due to sinus pressure. He is scheduled to see ENT in 2 weeks for evaluation. He is not continuing HBO until he is cleared by ENT. 07-06-2022 patient presents today for follow-up concerning his plantar foot ulcer. The good news is this actually is significantly better compared to previous. Some of the bone which is necrotic is starting to slough off and I am going  to perform debridement to clear this away today. Nonetheless I do think that the patient is going to need hyperbarics and we need to try to get it as soon as possible. He still having a lot of sinus pressure I can even hear the congestion today. He is on 3 different antibiotics cefadroxil, metronidazole, and Levaquin currently for the osteomyelitis. For that reason this should actually help with the infection if he does have a chronic sinusitis I am also going to see about getting him on steroids to try to see if this can benefit him as well. He voiced understanding and he is in agreement with giving that shot. This is probably can to spike his blood sugars which I discussed with him he is going to be seeing his primary care provider later today I want him to let her know as well what is going on and why so that she could be aware but I really do think he needs to take the prednisone. We need to get him in the chamber as soon as possible to try to help with getting this wound to heal and closed so that he does not lose his foot. 07-13-2022 upon evaluation today patient appears to be doing better in regard to his wound he continues to show signs of new granulation am going to perform some debridement today but overall this is doing quite well. 07-20-2022 upon evaluation today patient's wound is actually showing some signs of improvement he does have 1 area on the fifth metatarsal region where there is some necrotic tendon that  is noted at this point. Unfortunately this is preventing good granulation and over this point the rest of the metatarsal region is completely covered over with granulation tissue which is great news there is some other slough and biofilm buildup that I will clearway as well. 07-27-2022 upon evaluation today patient appears to be doing well currently in regard to his plantar foot ulcer. He has been tolerating the dressing changes without complication fortunately. I do believe he is  making excellent progress and each time I see him this is better and better is pretty much completely covered as far as any bone exposure is concerned and he still is on the IV antibiotics were still also undergoing hyperbarics at this point that he just got started and is doing quite well with since getting the tubes in his ears.Marland Kitchen 08-03-2022 upon evaluation today patient appears to be doing well in general although has been having a lot of drainage compared to normal. It was actually leaking through his dressing today. He tells me he changed it last night. With that being said he has been up on his feet a lot more which I think is probably a big part of the issue here. Fortunately I do not see any signs of infection locally or systemically. 08-17-2022 upon evaluation today patient is doing well with regard to his wound this is actually measuring smaller which is good news. I am very pleased in that regard. With that being said he has not been participating HBO therapy as he really needs to afford to see this heal appropriately and completely. In fact he has only made 9 total HBO treatments in the past 4 weeks. Again this is not therapeutic and I discussed that with the patient. Nonetheless he is still improving and since the hyperbarics is not helping based on the discussion today the patient is going to discontinue HBO therapy at this point. He tells me that getting here 5 days a week just is not possible. He has had difficulty getting here and then also when he was here had difficulty with getting here at the right time which has also been part of the problem. Nonetheless either way I am pleased that he is doing better and my hope is that we will still be able to get him healed obviously he should continue with the antibiotics as well as the wound care as he has been doing this seems to be doing excellent for him but everything is improving quite nicely. 08-24-2022 upon evaluation today patient appears  to be doing well currently in regard to his wound this is actually measuring significantly better even compared to last week. I am very pleased in that regard I do not see any signs of infection and I think he is making excellent progress here. 08-31-2022 upon evaluation today patient's wound is actually showing signs of significant improvement. I am actually very pleased with where things stand currently. There does not appear to be any evidence of active infection locally or systemically at this time. No fevers, chills, nausea, vomiting, or diarrhea. 09-19-2022 upon evaluation patient's wound is actually showing signs of improvement though it still having quite a bit of drainage. I do believe he would benefit from switching over to [employer]. I discussed that with him today. He is definitely in agreement with that plan. 09-28-2022 upon evaluation patient's wound is actually showing signs of improvement. With that being said he has been using the [facility] but he has not had enough  to really cover the whole wound. In fact he tells me he just got his supplies today that we had ordered last week. Fortunately I do not see any evidence of active infection at this time. Electronic Signature(s) Signed: 09/28/2022 4:12:48 PM By: Lenda Kelp PA-C Entered By: Lenda Kelp on 09/28/2022 16:12:48 Physical Exam Details -------------------------------------------------------------------------------- [name] ([id number]) [id number] Page 4 of 9 Patient Name: Date of Service: [name], [name] 09/28/2022 2:30 PM Medical Record Number: [id number] Patient Account Number: [id number] Date of Birth/Sex: Treating RN: Nov 22, 1966 (56 y.o. [name] Primary Care Provider: PA Zenovia Jordan, [place] Other Clinician: Referring Provider: Treating Provider/Extender: Merrilyn Puma in Treatment: 15 Constitutional Well-nourished and well-hydrated in no acute  distress. Respiratory normal breathing without difficulty. Psychiatric this patient is able to make decisions and demonstrates good insight into disease process. Alert and Oriented x 3. pleasant and cooperative. Notes Upon inspection patient's wound bed actually showed signs of good granulation and epithelization at this point. Fortunately I do believe that we are on the right track and overall I think the patient is making good progress here. I did perform some debridement to clearway some of the slough and biofilm down to good subcutaneous tissue he tolerated that without complication postdebridement the wound bed is significantly improved. Electronic Signature(s) Signed: 09/28/2022 4:13:12 PM By: Lenda Kelp PA-C Entered By: Lenda Kelp on 09/28/2022 16:13:12 -------------------------------------------------------------------------------- Physician Orders Details Patient Name: Date of Service: [name], [name] 09/28/2022 2:30 PM Medical Record Number: [id number] Patient Account Number: [id number] Date of Birth/Sex: Treating RN: [birth year] (56 y.o. [name] Primary Care Provider: PA TIENT, [place] Other Clinician: Referring Provider: Treating Provider/Extender: Merrilyn Puma in Treatment: 15 Verbal / Phone Orders: No Diagnosis Coding ICD-10 Coding Code Description [phone] Other chronic osteomyelitis, left ankle and foot E11.621 Type 2 diabetes mellitus with foot ulcer L97.524 Non-pressure chronic ulcer of other part of left foot with necrosis of bone Follow-up Appointments Return Appointment in 1 week. Bathing/ Shower/ Hygiene May shower; gently cleanse wound with antibacterial soap, rinse and pat dry prior to dressing wounds Anesthetic (Use 'Patient Medications' Section for Anesthetic Order Entry) Lidocaine applied to wound bed Edema Control - Lymphedema / Segmental Compressive Device / Other Elevate, Exercise Daily and A void Standing for Long  Periods of Time. Elevate legs to the level of the heart and pump ankles as often as possible Elevate leg(s) parallel to the floor when sitting. Wound Treatment Wound #1 - Foot Wound Laterality: Plantar, Left Prim Dressing: Hydrofera Blue Ready Transfer Foam, 4x5 (in/in) (Dispense As Written) 1 x Per Day/30 Days [name], [name] ([id number]) [id number] Page 5 of 9 Discharge Instructions: Apply Hydrofera Blue Ready to wound bed as directed Secondary Dressing: ABD Pad 5x9 (in/in) (Generic) 1 x Per Day/30 Days Discharge Instructions: Cover with ABD pad Secondary Dressing: Kerlix 4.5 x 4.1 (in/yd) (Generic) 1 x Per Day/30 Days Discharge Instructions: Apply Kerlix 4.5 x 4.1 (in/yd) as instructed Secured With: Medipore T - 79M Medipore H Soft Cloth Surgical T ape ape, 2x2 (in/yd) (Generic) 1 x Per Day/30 Days Electronic Signature(s) Signed: 09/28/2022 4:52:35 PM By: Midge Aver MSN RN CNS WTA Signed: 09/28/2022 5:00:04 PM By: Lenda Kelp PA-C Entered By: Midge Aver on 09/28/2022 15:10:24 -------------------------------------------------------------------------------- Problem List Details Patient Name: Date of Service: [name], [name] 09/28/2022 2:30 PM Medical Record Number: [id number] Patient Account Number: [id number] Date of Birth/Sex: Treating RN: 1966/09/26 (56 y.o. [name] Primary Care Provider: PA  Zenovia Jordan, [place] Other Clinician: Referring Provider: Treating Provider/Extender: Merrilyn Puma in Treatment: 15 Active Problems ICD-10 Encounter Code Description Active Date MDM Diagnosis [phone] Other chronic osteomyelitis, left ankle and foot 06/15/2022 No Yes E11.621 Type 2 diabetes mellitus with foot ulcer 06/15/2022 No Yes L97.524 Non-pressure chronic ulcer of other part of left foot with necrosis of bone 06/15/2022 No Yes Inactive Problems Resolved Problems Electronic Signature(s) Signed: 09/28/2022 4:52:35 PM By: Midge Aver MSN  RN CNS WTA Signed: 09/28/2022 5:00:04 PM By: Lenda Kelp PA-C Previous Signature: 09/28/2022 3:00:26 PM Version By: Lenda Kelp PA-C Entered By: Midge Aver on 09/28/2022 15:10:01 [name] ([id number]) [id number] Page 6 of 9 -------------------------------------------------------------------------------- Progress Note Details Patient Name: Date of Service: [name], [name] 09/28/2022 2:30 PM Medical Record Number: [id number] Patient Account Number: [id number] Date of Birth/Sex: Treating RN: [birth year] (56 y.o. 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 America Brown care:   ADMISSION6/15/2023This is a 56 year old poorly controlled type II diabetic (last A1c 11.5%). In January of this year, he presented to the hospital with an abscess in his left foot. He also had a foreign body present. It was removed in the ER and he was admitted to the hospital for IV antibiotics. He was subsequently discharged on oral antibiotics. Due to financial concerns, he has not taken his diabetic medications (metformin and 70/30 insulin) and as a result, presented back to the emergency room on June 1 with worsening findings, including frank pus and osteomyelitis on MRI. BKA was recommended, but he declined. He was taken to the operating room by Dr. Lajoyce Corners who performed an extensive debridement. Cultures were taken and he was placed on IV antibiotics. He saw infectious disease while in the hospital who prescribed a 6-week course of IV antibiotics including cefazolin as well as oral metronidazole and levofloxacin. The infectious disease provider also  indicated that he would benefit from below-knee amputation, but the patient desired another opinion and therefore he has been referred to wound care center for further evaluation and management. ABIs obtained while he was in the hospital demonstrate adequate blood flow. Essentially the entire bottom of the patient's left foot has been removed. The heads of the majority of his metatarsal bones are exposed along with their accompanying tendons. Muscle and fat are exposed as well. The wound surface is fairly dry; the patient reports that he was not given any specific wound care instructions other than to place a dry dressing on it after showering. There is no odor or purulent drainage identified. 05/26/2022: The wound measures a little bit smaller today. There is some good granulation tissue beginning to form on the surface. He still has exposed bone and tendon. There is some slough accumulation. He saw infectious disease earlier this week and was frustrated that they recommended amputation. He is interested in another surgical opinion. He continues to smoke but says that he has cut down. He is using a knee scooter for mobility so that he can stay off of his foot. 06/05/2022: He saw Dr. Lajoyce Corners last week who was supportive of our efforts to try to salvage the foot. I referred him to vascular surgery to see Dr. Chestine Spore but he has not received an appointment yet. He continues  on IV antibiotics. He says that after our conversation last week about him being unable to initiate hyperbarics if he was going to continue to smoke, he has not had a cigarette since. He is now at 4 weeks of conventional management and his insurance was initiated on July 1. Today, the wound continues to show evidence of improvement. There is good granulation tissue forming. The metatarsals are still exposed but actually have some pink periosteum. There is still some slough and fibrinous exudate present. No odor or purulent drainage. We have  just been using Dakin's dressing changes for now. Admission to Wound Care Center at [facility]: 06-15-2022 upon evaluation today patient presents for initial inspection here in our clinic though he has been seen by Dr. Lady Gary in [place] up to this point. I did review her notes and records as well they have been using Dakin's moistened gauze dressing which has done a great job at helping to clean out the bottom of his foot. Please see the discourse above for additional information in regard to treatment course up until the patient arrives here in the [place] [facility] today. The patient is ready to get started with HBO therapy as soon as possible I do think based on all my review of his records this seems to be appropriate he does have chronic osteomyelitis is also been offered amputation this is obviously a limb salvage operation here. We are trying to prevent him from having to undergo an extensive amputation which would likely be a below-knee amputation which in turn is going to affect his quality of life he wants to try to avoid that if at all possible I think hyperbaric oxygen therapy is his best bet to achieve that goal. Currently he has been on IV Ancef. That actually ends tomorrow. Following that according to notes it appears he supposed to be taking Levaquin and Flagyl for an additional period of time although I am not certain exactly how long that with the. He has been seeing regional physicians infectious disease in [place] and again that so I recommended that he contact today in order to find out what the plan is going forward. His most recent hemoglobin A1c as noted above was 11.5 on 05-07-2022 he tells me trying to work on that he is also quit smoking as of this point. 06-22-2022 upon evaluation today patient appears to be doing well currently in regard to his wound all things considered. He is showing signs of a lot of new granulation tissue and overall very pleased. He is changing this once  a day with the Dakin's moistened gauze dressing. Fortunately I do not see any evidence of infection locally or systemically at this time. We have been trying to get him into the hyperbaric chamber he was having a lot of issues with sinus pressure and we have made referral and called multiple ENT specialist the earliest we have been able to get his August 9 at [facility] ear nose throat. They are going to try to work him in sooner if they have any opportunities to do so. Obviously I think the sooner he can do this the better. We need to get him in hyperbarics as quickly as possible. 7/26; patient presents for follow-up. He has been using Dakin's wet-to-dry dressings and oil emollient dressing over the exposed bone. He currently denies systemic signs of infection. He states he is scheduled to see infectious disease, Dr Thedore Mins next week. She had ordered an MRI of the left foot and he is  scheduled to have this done tomorrow. He currently denies systemic signs of infection Including fever/chills, nausea/vomiting increased warmth or erythema to the wound bed or purulent drainage. He started hyperbaric oxygen treatment however did not tolerate this due to sinus pressure. He is scheduled to see ENT in 2 weeks for evaluation. He is not continuing HBO until he is cleared by ENT. 07-06-2022 patient presents today for follow-up concerning his plantar foot ulcer. The good news is this actually is significantly better compared to previous. Some of the bone which is necrotic is starting to slough off and I am going to perform debridement to clear this away today. Nonetheless I do think that the [place], Aasim ([id number]) [id number] Page 7 of 9 patient is going to need hyperbarics and we need to try to get it as soon as possible. He still having a lot of sinus pressure I can even hear the congestion today. He is on 3 different antibiotics cefadroxil, metronidazole, and Levaquin currently for the  osteomyelitis. For that reason this should actually help with the infection if he does have a chronic sinusitis I am also going to see about getting him on steroids to try to see if this can benefit him as well. He voiced understanding and he is in agreement with giving that shot. This is probably can to spike his blood sugars which I discussed with him he is going to be seeing his primary care provider later today I want him to let her know as well what is going on and why so that she could be aware but I really do think he needs to take the prednisone. We need to get him in the chamber as soon as possible to try to help with getting this wound to heal and closed so that he does not lose his foot. 07-13-2022 upon evaluation today patient appears to be doing better in regard to his wound he continues to show signs of new granulation am going to perform some debridement today but overall this is doing quite well. 07-20-2022 upon evaluation today patient's wound is actually showing some signs of improvement he does have 1 area on the fifth metatarsal region where there is some necrotic tendon that is noted at this point. Unfortunately this is preventing good granulation and over this point the rest of the metatarsal region is completely covered over with granulation tissue which is great news there is some other slough and biofilm buildup that I will clearway as well. 07-27-2022 upon evaluation today patient appears to be doing well currently in regard to his plantar foot ulcer. He has been tolerating the dressing changes without complication fortunately. I do believe he is making excellent progress and each time I see him this is better and better is pretty much completely covered as far as any bone exposure is concerned and he still is on the IV antibiotics were still also undergoing hyperbarics at this point that he just got started and is doing quite well with since getting the tubes in his  ears.Marland Kitchen 08-03-2022 upon evaluation today patient appears to be doing well in general although has been having a lot of drainage compared to normal. It was actually leaking through his dressing today. He tells me he changed it last night. With that being said he has been up on his feet a lot more which I think is probably a big part of the issue here. Fortunately I do not see any signs of infection locally or systemically. 08-17-2022 upon  evaluation today patient is doing well with regard to his wound this is actually measuring smaller which is good news. I am very pleased in that regard. With that being said he has not been participating HBO therapy as he really needs to afford to see this heal appropriately and completely. In fact he has only made 9 total HBO treatments in the past 4 weeks. Again this is not therapeutic and I discussed that with the patient. Nonetheless he is still improving and since the hyperbarics is not helping based on the discussion today the patient is going to discontinue HBO therapy at this point. He tells me that getting here 5 days a week just is not possible. He has had difficulty getting here and then also when he was here had difficulty with getting here at the right time which has also been part of the problem. Nonetheless either way I am pleased that he is doing better and my hope is that we will still be able to get him healed obviously he should continue with the antibiotics as well as the wound care as he has been doing this seems to be doing excellent for him but everything is improving quite nicely. 08-24-2022 upon evaluation today patient appears to be doing well currently in regard to his wound this is actually measuring significantly better even compared to last week. I am very pleased in that regard I do not see any signs of infection and I think he is making excellent progress here. 08-31-2022 upon evaluation today patient's wound is actually showing signs of  significant improvement. I am actually very pleased with where things stand currently. There does not appear to be any evidence of active infection locally or systemically at this time. No fevers, chills, nausea, vomiting, or diarrhea. 09-19-2022 upon evaluation patient's wound is actually showing signs of improvement though it still having quite a bit of drainage. I do believe he would benefit from [facility] I discussed that with him today. He is definitely in agreement with that plan. 09-28-2022 upon evaluation patient's wound is actually showing signs of improvement. With that being said he has been [facility] but he has not had enough to really cover the whole wound. In fact he tells me he just got his supplies today that we had ordered last week. Fortunately I do not see any evidence of active infection at this time. Objective Constitutional Well-nourished and well-hydrated in no acute distress. Vitals Time Taken: 2:46 PM, Height: 70 in, Weight: 230 lbs, BMI: 33, Temperature: 98.2 F, Pulse: 105 bpm, Respiratory Rate: 16 breaths/min, Blood Pressure: 113/77 mmHg. Respiratory normal breathing without difficulty. Psychiatric this patient is able to make decisions and demonstrates good insight into disease process. Alert and Oriented x 3. pleasant and cooperative. General Notes: Upon inspection patient's wound bed actually showed signs of good granulation and epithelization at this point. Fortunately I do believe that we are on the right track and overall I think the patient is making good progress here. I did perform some debridement to clearway some of the slough and biofilm down to good subcutaneous tissue he tolerated that without complication postdebridement the wound bed is significantly improved. Integumentary (Hair, Skin) Wound #1 status is Open. Original cause of wound was Gradually Appeared. The date acquired was: 12/04/2021. The wound has been in  treatment 15 weeks. The wound is located on the Left,Plantar Foot. The wound measures 7cm length x 6.7cm width x 0.4cm depth; 36.835cm^2 area  and 14.734cm^3 volume. There is Fat Layer (Subcutaneous Tissue) exposed. There is a large amount of serosanguineous drainage noted. Foul odor after cleansing was noted. There is medium (34-66%) pink, hyper - granulation within the wound bed. There is a medium (34-66%) amount of necrotic tissue within the wound bed including Adherent Slough. Assessment Active Problems ICD-10 Other chronic osteomyelitis, left ankle and foot Type 2 diabetes mellitus with foot ulcer Spielberg, Salvador ([id number]) [id number] Page 8 of 9 Non-pressure chronic ulcer of other part of left foot with necrosis of bone Procedures Wound #1 Pre-procedure diagnosis of Wound #1 is a Diabetic Wound/Ulcer of the Lower Extremity located on the Left,Plantar Foot .Severity of Tissue Pre Debridement is: Fat layer exposed. There was a Excisional Skin/Subcutaneous Tissue Debridement with a total area of 46.9 sq cm performed by Nelida Meuse., PA-C. With the following instrument(s): Curette to remove Viable and Non-Viable tissue/material. Material removed includes Subcutaneous Tissue, Slough, and Biofilm. A time out was conducted at 15:02, prior to the start of the procedure. A Moderate amount of bleeding was controlled with Pressure. The procedure was tolerated well. Post Debridement Measurements: 7cm length x 6.7cm width x 0.5cm depth; 18.418cm^3 volume. Character of Wound/Ulcer Post Debridement is stable. Severity of Tissue Post Debridement is: Fat layer exposed. Post procedure Diagnosis Wound #1: Same as Pre-Procedure Plan Follow-up Appointments: Return Appointment in 1 week. Bathing/ Shower/ Hygiene: May shower; gently cleanse wound with antibacterial soap, rinse and pat dry prior to dressing wounds Anesthetic (Use 'Patient Medications' Section for Anesthetic Order  Entry): Lidocaine applied to wound bed Edema Control - Lymphedema / Segmental Compressive Device / Other: Elevate, Exercise Daily and Avoid Standing for Long Periods of Time. Elevate legs to the level of the heart and pump ankles as often as possible Elevate leg(s) parallel to the floor when sitting. WOUND #1: - Foot Wound Laterality: Plantar, Left Prim Dressing: Hydrofera Blue Ready Transfer Foam, 4x5 (in/in) (Dispense As Written) 1 x Per Day/30 Days ary Discharge Instructions: Apply Hydrofera Blue Ready to wound bed as directed Secondary Dressing: ABD Pad 5x9 (in/in) (Generic) 1 x Per Day/30 Days Discharge Instructions: Cover with ABD pad Secondary Dressing: Kerlix 4.5 x 4.1 (in/yd) (Generic) 1 x Per Day/30 Days Discharge Instructions: Apply Kerlix 4.5 x 4.1 (in/yd) as instructed Secured With: Medipore T - 35M Medipore H Soft Cloth Surgical T ape ape, 2x2 (in/yd) (Generic) 1 x Per Day/30 Days 1. I am going to suggest that we continue to monitor for any signs of infection obviously if anything changes patient should contact the office and let me know. 2. I am also can recommend we continue with the [facility] followed by ABD pad and a roll gauze to secure in place. 3. He should continue with the knee scooter as well this is doing excellent for offloading. We will see patient back for reevaluation in 1 week here in the clinic. If anything worsens or changes patient will contact our office for additional recommendations. Electronic Signature(s) Signed: 09/28/2022 4:13:35 PM By: Lenda Kelp PA-C Entered By: Lenda Kelp on 09/28/2022 16:13:34 -------------------------------------------------------------------------------- SuperBill Details Patient Name: Date of Service: Matthew BURTON, GAHAN 09/28/2022 Medical Record Number: [id number] Patient Account Number: [id number] Date of Birth/Sex: Treating RN: [birth year] (56 y.o. [name] Primary Care Provider: PA Zenovia Jordan, [place] Other  Clinician: Referring Provider: Treating Provider/Extender: Merrilyn Puma in Treatment: [address] [place] ([id number]) [id number] Page 9 of 9 Diagnosis Coding ICD-10 Codes Code Description [phone] Other chronic osteomyelitis, left ankle  and foot E11.621 Type 2 diabetes mellitus with foot ulcer L97.524 Non-pressure chronic ulcer of other part of left foot with necrosis of bone Facility Procedures : CPT4 Code: [id number] Description: 11042 - DEB SUBQ TISSUE 20 SQ CM/< ICD-10 Diagnosis Description L97.524 Non-pressure chronic ulcer of other part of left foot with necrosis of bone Modifier: Quantity: 1 : CPT4 Code: [id number] Description: 11045 - DEB SUBQ TISS EA ADDL 20CM ICD-10 Diagnosis Description L97.524 Non-pressure chronic ulcer of other part of left foot with necrosis of bone Modifier: Quantity: 2 Physician Procedures : CPT4 Code Description Modifier [id number] 11042 - WC PHYS SUBQ TISS 20 SQ CM ICD-10 Diagnosis Description L97.524 Non-pressure chronic ulcer of other part of left foot with necrosis of bone Quantity: 1 : [id number] 11045 - WC PHYS SUBQ TISS EA ADDL 20 CM ICD-10 Diagnosis Description L97.524 Non-pressure chronic ulcer of other part of left foot with necrosis of bone Quantity: 2 Electronic Signature(s) Signed: 09/28/2022 4:14:00 PM By: Lenda Kelp PA-C Entered By: Lenda Kelp on 09/28/2022 16:13:59

## 2022-10-05 ENCOUNTER — Encounter: Payer: 59 | Attending: Physician Assistant | Admitting: Physician Assistant

## 2022-10-05 DIAGNOSIS — E11621 Type 2 diabetes mellitus with foot ulcer: Secondary | ICD-10-CM | POA: Insufficient documentation

## 2022-10-05 DIAGNOSIS — M86672 Other chronic osteomyelitis, left ankle and foot: Secondary | ICD-10-CM | POA: Diagnosis not present

## 2022-10-05 DIAGNOSIS — L97524 Non-pressure chronic ulcer of other part of left foot with necrosis of bone: Secondary | ICD-10-CM | POA: Diagnosis not present

## 2022-10-05 DIAGNOSIS — Z87891 Personal history of nicotine dependence: Secondary | ICD-10-CM | POA: Diagnosis not present

## 2022-10-05 DIAGNOSIS — L97522 Non-pressure chronic ulcer of other part of left foot with fat layer exposed: Secondary | ICD-10-CM | POA: Diagnosis not present

## 2022-10-05 NOTE — Progress Notes (Addendum)
[name] ([id number]) [id number] Page 1 of 9 Visit Report for 10/05/2022 Chief Complaint Document Details Patient Name: Date of Service: [name], [name] 10/05/2022 2:30 PM Medical Record Number: [id number] Patient Account Number: [id number] Date of Birth/Sex: Treating RN: Apr 17, 1966 (56 y.o. 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 [name]) Yevonne Pax Primary Care Provider: PA Zenovia Jordan, NO Other Clinician: Referring Provider: Treating Provider/Extender: Merrilyn Puma in Treatment: 16 Debridement Performed for Assessment: Wound #1 Left,Plantar Foot Performed By: Physician Nelida Meuse., PA-C Debridement Type: Debridement Severity of Tissue Pre Debridement: Fat layer exposed Level of Consciousness (Pre-procedure): Awake and Alert Pre-procedure Verification/Time Out Yes - 15:00 Taken: Start Time: 15:00 T Area Debrided (L x W): otal 7 (cm) x 6.9 (cm) = 48.3 (cm) Tissue and other material debrided: Callus, Slough, Subcutaneous, Slough Level: Skin/Subcutaneous Tissue Debridement Description: Excisional Instrument: Curette Bleeding: Minimum Hemostasis Achieved:  Pressure Response to Treatment: Procedure was tolerated well Level of Consciousness (Post- Awake and Alert [name], [name] ([id number]) [id number] Page 2 of 9 Awake and Alert procedure): Post Debridement Measurements of Total Wound Length: (cm) 7.7 Width: (cm) 6.9 Depth: (cm) 0.4 Volume: (cm) 16.691 Character of Wound/Ulcer Post Debridement: Stable Severity of Tissue Post Debridement: Fat layer exposed Post Procedure Diagnosis Same as Pre-procedure Electronic Signature(s) Signed: 10/05/2022 5:42:41 PM By: Lenda Kelp PA-C Signed: 10/09/2022 9:30:18 AM By: Yevonne Pax RN Entered By: Yevonne Pax on 10/05/2022 15:01:52 -------------------------------------------------------------------------------- HPI Details Patient Name: Date of Service: [name], [name] 10/05/2022 2:30 PM Medical Record Number: [id number] Patient Account Number: [id number] Date of Birth/Sex: Treating RN: [birth year] (56 y.o. [name] Primary Care Provider: PA Zenovia Jordan, NO Other Clinician: Referring Provider: Treating Provider/Extender: Merrilyn Puma in Treatment: 16 History of Present Illness HPI Description: Notes from [facility] under Dr. America Brown care:   ADMISSION6/15/2023This is a 56 year old poorly controlled type II diabetic (last A1c 11.5%). In January of this year, he presented to the hospital with an abscess in his left foot. He also had a foreign body present. It was removed in the ER and he was admitted to the hospital for IV antibiotics. He was subsequently discharged on oral antibiotics. Due to financial concerns, he has not taken his diabetic medications (metformin and 70/30 insulin) and as a result, presented back to the emergency room on June 1 with worsening findings, including frank pus and osteomyelitis on MRI. BKA was recommended, but he declined. He was taken to the operating room by Dr. Lajoyce Corners who performed an extensive  debridement. Cultures were taken and he was placed on IV antibiotics. He saw infectious disease while in the hospital who prescribed a 6-week course of IV antibiotics including cefazolin as well as oral metronidazole and levofloxacin. The infectious disease provider also indicated that he would benefit from below-knee amputation, but the patient desired another opinion and therefore he has been referred to wound care center for further evaluation  and management. ABIs obtained while he was in the hospital demonstrate adequate blood flow. Essentially the entire bottom of the patient's left foot has been removed. The heads of the majority of his metatarsal bones are exposed along with their accompanying tendons. Muscle and fat are exposed as well. The wound surface is fairly dry; the patient reports that he was not given any specific wound care instructions other than to place a dry dressing on it after showering. There is no odor or purulent drainage identified. 05/26/2022: The wound measures a little bit smaller today. There is some good granulation tissue beginning to form on the surface. He still has exposed bone and tendon. There is some slough accumulation. He saw infectious disease earlier this week and was frustrated that they recommended amputation. He is interested in another surgical opinion. He continues to smoke but says that he has cut down. He is using a knee scooter for mobility so that he can stay off of his foot. 06/05/2022: He saw Dr. Lajoyce Corners last week who was supportive of our efforts to try to salvage the foot. I referred him to vascular surgery to see Dr. Chestine Spore but he has not received an appointment yet. He continues on IV antibiotics. He says that after our conversation last week about him being unable to initiate hyperbarics if he was going to continue to smoke, he has not had a cigarette since. He is now at 4 weeks of conventional management and his insurance was initiated on July 1.  Today, the wound continues to show evidence of improvement. There is good granulation tissue forming. The metatarsals are still exposed but actually have some pink periosteum. There is still some slough and fibrinous exudate present. No odor or purulent drainage. We have just been using Dakin's dressing changes for now. Admission to Wound [facility] at [facility] 06-15-2022 upon evaluation today patient presents for initial inspection here in our clinic though he has been seen by Dr. Lady Gary in [place] up to this point. I did review her notes and records as well they have been using Dakin's moistened gauze dressing which has done a great job at helping to clean out the bottom of his foot. Please see the discourse above for additional information in regard to treatment course up until the patient arrives here in the [place] clinic today. The patient is ready to get started with HBO therapy as soon as possible I do think based on all my review of his records this seems to be appropriate he does have chronic osteomyelitis is also been offered amputation this is obviously a limb salvage operation here. We are trying to prevent him from having to undergo an extensive amputation which would likely be a below-knee amputation which in turn is going to affect his quality of life he wants to [place], Nedra Hai ([id number]) [id number] Page 3 of 9 try to avoid that if at all possible I think hyperbaric oxygen therapy is his best bet to achieve that goal. Currently he has been on IV Ancef. That actually ends tomorrow. Following that according to notes it appears he supposed to be taking Levaquin and Flagyl for an additional period of time although I am not certain exactly how long that with the. He has been seeing regional physicians infectious disease in [place] and again that so I recommended that he contact today in order to find out what the plan is going forward. His most recent  hemoglobin A1c as noted above was 11.5 on 05-07-2022 he tells  me trying to work on that he is also quit smoking as of this point. 06-22-2022 upon evaluation today patient appears to be doing well currently in regard to his wound all things considered. He is showing signs of a lot of new granulation tissue and overall very pleased. He is changing this once a day with the Dakin's moistened gauze dressing. Fortunately I do not see any evidence of infection locally or systemically at this time. We have been trying to get him into the hyperbaric chamber he was having a lot of issues with sinus pressure and we have made referral and called multiple ENT specialist the earliest we have been able to get his August 9 at [facility] ear nose throat. They are going to try to work him in sooner if they have any opportunities to do so. Obviously I think the sooner he can do this the better. We need to get him in hyperbarics as quickly as possible. 7/26; patient presents for follow-up. He has been using Dakin's wet-to-dry dressings and oil emollient dressing over the exposed bone. He currently denies systemic signs of infection. He states he is scheduled to see infectious disease, Dr Thedore Mins next week. She had ordered an MRI of the left foot and he is scheduled to have this done tomorrow. He currently denies systemic signs of infection Including fever/chills, nausea/vomiting increased warmth or erythema to the wound bed or purulent drainage. He started hyperbaric oxygen treatment however did not tolerate this due to sinus pressure. He is scheduled to see ENT in 2 weeks for evaluation. He is not continuing HBO until he is cleared by ENT. 07-06-2022 patient presents today for follow-up concerning his plantar foot ulcer. The good news is this actually is significantly better compared to previous. Some of the bone which is necrotic is starting to slough off and I am going to perform debridement to clear this away today.  Nonetheless I do think that the patient is going to need hyperbarics and we need to try to get it as soon as possible. He still having a lot of sinus pressure I can even hear the congestion today. He is on 3 different antibiotics cefadroxil, metronidazole, and Levaquin currently for the osteomyelitis. For that reason this should actually help with the infection if he does have a chronic sinusitis I am also going to see about getting him on steroids to try to see if this can benefit him as well. He voiced understanding and he is in agreement with giving that shot. This is probably can to spike his blood sugars which I discussed with him he is going to be seeing his primary care provider later today I want him to let her know as well what is going on and why so that she could be aware but I really do think he needs to take the prednisone. We need to get him in the chamber as soon as possible to try to help with getting this wound to heal and closed so that he does not lose his foot. 07-13-2022 upon evaluation today patient appears to be doing better in regard to his wound he continues to show signs of new granulation am going to perform some debridement today but overall this is doing quite well. 07-20-2022 upon evaluation today patient's wound is actually showing some signs of improvement he does have 1 area on the fifth metatarsal region where there is some necrotic tendon that is noted at this point. Unfortunately this is preventing good granulation and  over this point the rest of the metatarsal region is completely covered over with granulation tissue which is great news there is some other slough and biofilm buildup that I will clearway as well. 07-27-2022 upon evaluation today patient appears to be doing well currently in regard to his plantar foot ulcer. He has been tolerating the dressing changes without complication fortunately. I do believe he is making excellent progress and each time I see him  this is better and better is pretty much completely covered as far as any bone exposure is concerned and he still is on the IV antibiotics were still also undergoing hyperbarics at this point that he just got started and is doing quite well with since getting the tubes in his ears.Marland Kitchen 08-03-2022 upon evaluation today patient appears to be doing well in general although has been having a lot of drainage compared to normal. It was actually leaking through his dressing today. He tells me he changed it last night. With that being said he has been up on his feet a lot more which I think is probably a big part of the issue here. Fortunately I do not see any signs of infection locally or systemically. 08-17-2022 upon evaluation today patient is doing well with regard to his wound this is actually measuring smaller which is good news. I am very pleased in that regard. With that being said he has not been participating HBO therapy as he really needs to afford to see this heal appropriately and completely. In fact he has only made 9 total HBO treatments in the past 4 weeks. Again this is not therapeutic and I discussed that with the patient. Nonetheless he is still improving and since the hyperbarics is not helping based on the discussion today the patient is going to discontinue HBO therapy at this point. He tells me that getting here 5 days a week just is not possible. He has had difficulty getting here and then also when he was here had difficulty with getting here at the right time which has also been part of the problem. Nonetheless either way I am pleased that he is doing better and my hope is that we will still be able to get him healed obviously he should continue with the antibiotics as well as the wound care as he has been doing this seems to be doing excellent for him but everything is improving quite nicely. 08-24-2022 upon evaluation today patient appears to be doing well currently in regard to his wound  this is actually measuring significantly better even compared to last week. I am very pleased in that regard I do not see any signs of infection and I think he is making excellent progress here. 08-31-2022 upon evaluation today patient's wound is actually showing signs of significant improvement. I am actually very pleased with where things stand currently. There does not appear to be any evidence of active infection locally or systemically at this time. No fevers, chills, nausea, vomiting, or diarrhea. 09-19-2022 upon evaluation patient's wound is actually showing signs of improvement though it still having quite a bit of drainage. I do believe he would benefit from switching over to [facility]. I discussed that with him today. He is definitely in agreement with that plan. 09-28-2022 upon evaluation patient's wound is actually showing signs of improvement. With that being said he has been using the [facility] but he has not had enough to really cover the whole wound. In fact he tells me he  just got his supplies today that we had ordered last week. Fortunately I do not see any evidence of active infection at this time. 10-05-2022 upon evaluation today patient appears to be doing well currently in regard to his foot wound there is actually much less debridement today to be done compared to previous. Fortunately I see no signs of active infection locally or systemically at this time. Electronic Signature(s) Signed: 10/05/2022 3:08:28 PM By: Lenda Kelp PA-C Entered By: Lenda Kelp on 10/05/2022 15:08:28 [name] ([id number]) [id number] Page 4 of 9 -------------------------------------------------------------------------------- Physical Exam Details Patient Name: Date of Service: [name], [name] 10/05/2022 2:30 PM Medical Record Number: [id number] Patient Account Number: [id number] Date of Birth/Sex: Treating RN: Jun 24, 1966 (56 y.o. [name] Primary  Care Provider: PA Zenovia Jordan, NO Other Clinician: Referring Provider: Treating Provider/Extender: Merrilyn Puma in Treatment: 16 Notes Upon inspection patient's wound bed actually showed signs of good granulation and epithelization at this point. Fortunately I do not see any evidence of infection at this point which is great and overall I am extremely pleased I did perform debridement over the heel service clearway necrotic debris tolerated that without complication. Electronic Signature(s) Signed: 10/05/2022 3:08:38 PM By: Lenda Kelp PA-C Entered By: Lenda Kelp on 10/05/2022 15:08:37 -------------------------------------------------------------------------------- Physician Orders Details Patient Name: Date of Service: [name], [name] 10/05/2022 2:30 PM Medical Record Number: [id number] Patient Account Number: [id number] Date of Birth/Sex: Treating RN: 28-Jun-1966 (56 y.o. [name]) Yevonne Pax Primary Care Provider: PA TIENT, NO Other Clinician: Referring Provider: Treating Provider/Extender: Merrilyn Puma in Treatment: [phone] Verbal / Phone Orders: No Diagnosis Coding ICD-10 Coding Code Description [phone] Other chronic osteomyelitis, left ankle and foot E11.621 Type 2 diabetes mellitus with foot ulcer L97.524 Non-pressure chronic ulcer of other part of left foot with necrosis of bone Follow-up Appointments Return Appointment in 1 week. Bathing/ Shower/ Hygiene May shower; gently cleanse wound with antibacterial soap, rinse and pat dry prior to dressing wounds Anesthetic (Use 'Patient Medications' Section for Anesthetic Order Entry) Lidocaine applied to wound bed Edema Control - Lymphedema / Segmental Compressive Device / Other Elevate, Exercise Daily and A void Standing for Long Periods of Time. Elevate legs to the level of the heart and pump ankles as often as possible Elevate leg(s) parallel to the floor when sitting. Wound Treatment Wound  #1 - Foot Wound Laterality: Plantar, Left Prim Dressing: Hydrofera Blue Ready Transfer Foam, 4x5 (in/in) (Dispense As Written) 1 x Per Day/30 Days ary Discharge Instructions: Apply Hydrofera Blue Ready to wound bed as directed Secondary Dressing: ABD Pad 5x9 (in/in) (Generic) 1 x Per Day/30 Days Discharge Instructions: Cover with ABD pad Secondary Dressing: Kerlix 4.5 x 4.1 (in/yd) (Generic) 1 x Per Day/30 Days Discharge Instructions: Apply Kerlix 4.5 x 4.1 (in/yd) as instructed [name], [name] ([id number]) [id number] Page 5 of 9 Secured With: Medipore T - 19M Medipore H Soft Cloth Surgical T ape ape, 2x2 (in/yd) (Generic) 1 x Per Day/30 Days Electronic Signature(s) Signed: 10/05/2022 5:42:41 PM By: Lenda Kelp PA-C Signed: 10/09/2022 9:30:18 AM By: Yevonne Pax RN Entered By: Yevonne Pax on 10/05/2022 15:13:16 -------------------------------------------------------------------------------- Problem List Details Patient Name: Date of Service: [name], [name] 10/05/2022 2:30 PM Medical Record Number: [id number] Patient Account Number: [id number] Date of Birth/Sex: Treating RN: 1966/05/17 (56 y.o. [name] Primary Care Provider: PA Zenovia Jordan, NO Other Clinician: Referring Provider: Treating Provider/Extender: Merrilyn Puma in Treatment: 16 Active Problems ICD-10 Encounter Code Description Active  Date MDM Diagnosis [phone] Other chronic osteomyelitis, left ankle and foot 06/15/2022 No Yes E11.621 Type 2 diabetes mellitus with foot ulcer 06/15/2022 No Yes L97.524 Non-pressure chronic ulcer of other part of left foot with necrosis of bone 06/15/2022 No Yes Inactive Problems Resolved Problems Electronic Signature(s) Signed: 10/05/2022 5:42:41 PM By: Lenda Kelp PA-C Signed: 10/09/2022 9:30:18 AM By: Yevonne Pax RN Previous Signature: 10/05/2022 2:53:07 PM Version By: Lenda Kelp PA-C Entered By: Yevonne Pax on 10/05/2022  15:04:14 [name] ([id number]) [id number] Page 6 of 9 -------------------------------------------------------------------------------- Progress Note Details Patient Name: Date of Service: [name], [name] 10/05/2022 2:30 PM Medical Record Number: [id number] Patient Account Number: [id number] Date of Birth/Sex: Treating RN: [birth year] (56 y.o. 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 America Brown care:   ADMISSION6/15/2023This is a 56 year old poorly controlled type II diabetic (last A1c 11.5%). In January of this year, he presented to the hospital with an abscess in his left foot. He also had a foreign body present. It was removed in the ER and he was admitted to the hospital for IV antibiotics. He was subsequently discharged on oral antibiotics. Due to financial concerns, he has not taken his diabetic medications (metformin and 70/30 insulin) and as a result, presented back to the emergency room on June 1 with worsening findings, including frank pus and osteomyelitis on MRI. BKA was recommended, but he declined. He was taken to the operating room by Dr. Lajoyce Corners who performed an extensive debridement. Cultures were taken and he was placed on IV antibiotics. He saw infectious disease while in the hospital who prescribed a 6-week course of IV antibiotics including cefazolin as well as oral metronidazole and levofloxacin. The infectious disease provider also indicated that he would benefit from below-knee amputation, but the patient desired another opinion and therefore he has been referred to wound care center for further evaluation  and management. ABIs obtained while he was in the hospital demonstrate adequate blood flow. Essentially the entire bottom of the patient's left foot has been removed. The heads of the majority of his metatarsal bones are exposed along with their accompanying tendons. Muscle and fat are exposed as well. The wound surface is fairly dry; the patient reports that he was not given any specific wound care instructions other than to place a dry dressing on it after showering. There is no odor or purulent drainage identified. 05/26/2022: The wound measures a little bit smaller today. There is some good granulation tissue beginning to form on the surface. He still has exposed bone and tendon. There is some slough accumulation. He saw infectious disease earlier this week and was frustrated that they recommended amputation. He is interested in another surgical opinion. He continues to smoke but says that he has cut down. He is using a knee scooter for mobility so that he can stay off of his foot. 06/05/2022: He saw Dr. Lajoyce Corners last week who was supportive of our efforts to try to salvage the foot. I referred him to vascular surgery to see Dr. Chestine Spore but he has not received an appointment yet. He continues on IV antibiotics. He says that after our conversation last week about him being unable to initiate hyperbarics if he was going to continue to smoke,  he has not had a cigarette since. He is now at 4 weeks of conventional management and his insurance was initiated on July 1. Today, the wound continues to show evidence of improvement. There is good granulation tissue forming. The metatarsals are still exposed but actually have some pink periosteum. There is still some slough and fibrinous exudate present. No odor or purulent drainage. We have just been using Dakin's dressing changes for now. [facility]: 06-15-2022 upon evaluation today patient presents for initial inspection here in our clinic  though he has been seen by Dr. Lady Garyannon in [place] up to this point. I did review her notes and records as well they have been using Dakin's moistened gauze dressing which has done a great job at helping to clean out the bottom of his foot. Please see the discourse above for additional information in regard to treatment course up until the patient arrives here in the [place] clinic today. The patient is ready to get started with HBO therapy as soon as possible I do think based on all my review of his records this seems to be appropriate he does have chronic osteomyelitis is also been offered amputation this is obviously a limb salvage operation here. We are trying to prevent him from having to undergo an extensive amputation which would likely be a below-knee amputation which in turn is going to affect his quality of life he wants to try to avoid that if at all possible I think hyperbaric oxygen therapy is his best bet to achieve that goal. Currently he has been on IV Ancef. That actually ends tomorrow. Following that according to notes it appears he supposed to be taking Levaquin and Flagyl for an additional period of time although I am not certain exactly how long that with the. He has been seeing regional physicians infectious disease in [place] and again that so I recommended that he contact today in order to find out what the plan is going forward. His most recent hemoglobin A1c as noted above was 11.5 on 05-07-2022 he tells me trying to work on that he is also quit smoking as of this point. 06-22-2022 upon evaluation today patient appears to be doing well currently in regard to his wound all things considered. He is showing signs of a lot of new granulation tissue and overall very pleased. He is changing this once a day with the Dakin's moistened gauze dressing. Fortunately I do not see any evidence of infection locally or systemically at this time. We have been trying to get him into the  hyperbaric chamber he was having a lot of issues with sinus pressure and we have made referral and called multiple ENT specialist the earliest we have been able to get his August 9 at [facility] ear nose throat. They are going to try to work him in sooner if they have any opportunities to do so. Obviously I think the sooner he can do this the better. We need to get him in hyperbarics as quickly as possible. 7/26; patient presents for follow-up. He has been using Dakin's wet-to-dry dressings and oil emollient dressing over the exposed bone. He currently denies systemic signs of infection. He states he is scheduled to see infectious disease, Dr Thedore MinsSingh next week. She had ordered an MRI of the left foot and he is scheduled to have this done tomorrow. He currently denies systemic signs of infection Including fever/chills, nausea/vomiting increased warmth or erythema to the wound bed or purulent  drainage. He started hyperbaric oxygen treatment however did not tolerate this due to sinus pressure. He is scheduled to see ENT in 2 weeks for evaluation. He is not continuing HBO until he is cleared by ENT. 07-06-2022 patient presents today for follow-up concerning his plantar foot ulcer. The good news is this actually is significantly better compared to previous. Some of the bone which is necrotic is starting to slough off and I am going to perform debridement to clear this away today. Nonetheless I do think that the patient is going to need hyperbarics and we need to try to get it as soon as possible. He still having a lot of sinus pressure I can even hear the congestion today. He is on 3 different antibiotics cefadroxil, metronidazole, and Levaquin currently for the osteomyelitis. For that reason this should actually help with the infection if he does have a chronic sinusitis I am also going to see about getting him on steroids to try to see if this can benefit him as well. He voiced understanding and he is in  agreement with giving that shot. This is probably can to spike his blood sugars which I discussed with him he is going to be seeing his primary care provider later today I want him to let her know as well what is going on and why so that she could be aware but I really do think he needs to take the prednisone. We need to get him in the chamber as soon as possible to try to help with getting this wound to heal and closed so that he does not lose his foot. 07-13-2022 upon evaluation today patient appears to be doing better in regard to his wound he continues to show signs of new granulation am going to perform some debridement today but overall this is doing quite well. 07-20-2022 upon evaluation today patient's wound is actually showing some signs of improvement he does have 1 area on the fifth metatarsal region where there is some necrotic tendon that is noted at this point. Unfortunately this is preventing good granulation and over this point the rest of the metatarsal region is completely covered over with granulation tissue which is great news there is some other slough and biofilm buildup that I will clearway as well. [name], [name] ([id number]) [id number] Page 7 of 9 07-27-2022 upon evaluation today patient appears to be doing well currently in regard to his plantar foot ulcer. He has been tolerating the dressing changes without complication fortunately. I do believe he is making excellent progress and each time I see him this is better and better is pretty much completely covered as far as any bone exposure is concerned and he still is on the IV antibiotics were still also undergoing hyperbarics at this point that he just got started and is doing quite well with since getting the tubes in his ears.Marland Kitchen 08-03-2022 upon evaluation today patient appears to be doing well in general although has been having a lot of drainage compared to normal. It was actually leaking through his  dressing today. He tells me he changed it last night. With that being said he has been up on his feet a lot more which I think is probably a big part of the issue here. Fortunately I do not see any signs of infection locally or systemically. 08-17-2022 upon evaluation today patient is doing well with regard to his wound this is actually measuring smaller which is good news. I am very pleased in that  regard. With that being said he has not been participating HBO therapy as he really needs to afford to see this heal appropriately and completely. In fact he has only made 9 total HBO treatments in the past 4 weeks. Again this is not therapeutic and I discussed that with the patient. Nonetheless he is still improving and since the hyperbarics is not helping based on the discussion today the patient is going to discontinue HBO therapy at this point. He tells me that getting here 5 days a week just is not possible. He has had difficulty getting here and then also when he was here had difficulty with getting here at the right time which has also been part of the problem. Nonetheless either way I am pleased that he is doing better and my hope is that we will still be able to get him healed obviously he should continue with the antibiotics as well as the wound care as he has been doing this seems to be doing excellent for him but everything is improving quite nicely. 08-24-2022 upon evaluation today patient appears to be doing well currently in regard to his wound this is actually measuring significantly better even compared to last week. I am very pleased in that regard I do not see any signs of infection and I think he is making excellent progress here. 08-31-2022 upon evaluation today patient's wound is actually showing signs of significant improvement. I am actually very pleased with where things stand currently. There does not appear to be any evidence of active infection locally or systemically at this time.  No fevers, chills, nausea, vomiting, or diarrhea. 09-19-2022 upon evaluation patient's wound is actually showing signs of improvement though it still having quite a bit of drainage. I do believe he would benefit from switching over to [facility]. I discussed that with him today. He is definitely in agreement with that plan. 09-28-2022 upon evaluation patient's wound is actually showing signs of improvement. With that being said he has been using the [facility] but he has not had enough to really cover the whole wound. In fact he tells me he just got his supplies today that we had ordered last week. Fortunately I do not see any evidence of active infection at this time. 10-05-2022 upon evaluation today patient appears to be doing well currently in regard to his foot wound there is actually much less debridement today to be done compared to previous. Fortunately I see no signs of active infection locally or systemically at this time. Objective Constitutional Vitals Time Taken: 2:39 PM, Height: 70 in, Weight: 230 lbs, BMI: 33, Temperature: 97.7 F, Pulse: 90 bpm, Respiratory Rate: 16 breaths/min, Blood Pressure: 154/94 mmHg. Integumentary (Hair, Skin) Wound #1 status is Open. Original cause of wound was Gradually Appeared. The date acquired was: 12/04/2021. The wound has been in treatment 16 weeks. The wound is located on the Left,Plantar Foot. The wound measures 7cm length x 6.9cm width x 0.3cm depth; 37.935cm^2 area and 11.38cm^3 volume. There is Fat Layer (Subcutaneous Tissue) exposed. There is a large amount of serosanguineous drainage noted. Foul odor after cleansing was noted. There is medium (34-66%) pink, hyper - granulation within the wound bed. There is a medium (34-66%) amount of necrotic tissue within the wound bed including Adherent Slough. Assessment Active Problems ICD-10 Other chronic osteomyelitis, left ankle and foot Type 2 diabetes mellitus with foot ulcer Non-pressure  chronic ulcer of other part of left foot with necrosis of bone Procedures Wound #1  Pre-procedure diagnosis of Wound #1 is a Diabetic Wound/Ulcer of the Lower Extremity located on the Left,Plantar Foot .Severity of Tissue Pre Debridement is: Fat layer exposed. There was a Excisional Skin/Subcutaneous Tissue Debridement with a total area of 48.3 sq cm performed by Nelida Meuse., PA-C. With the following instrument(s): Curette Material removed includes Callus, Subcutaneous Tissue, and Slough. No specimens were taken. A time out was conducted at 15:00, prior to the start of the procedure. A Minimum amount of bleeding was controlled with Pressure. The procedure was tolerated well. Post Debridement Measurements: 7.7cm length x 6.9cm width x 0.4cm depth; 16.691cm^3 volume. Character of Wound/Ulcer Post Debridement is stable. Severity of Tissue Post Debridement is: Fat layer exposed. Post procedure Diagnosis Wound #1: Same as Pre-Procedure [name], [name] ([id number]) [id number] Page 8 of 9 Plan Follow-up Appointments: Return Appointment in 1 week. Bathing/ Shower/ Hygiene: May shower; gently cleanse wound with antibacterial soap, rinse and pat dry prior to dressing wounds Anesthetic (Use 'Patient Medications' Section for Anesthetic Order Entry): Lidocaine applied to wound bed Edema Control - Lymphedema / Segmental Compressive Device / Other: Elevate, Exercise Daily and Avoid Standing for Long Periods of Time. Elevate legs to the level of the heart and pump ankles as often as possible Elevate leg(s) parallel to the floor when sitting. WOUND #1: - Foot Wound Laterality: Plantar, Left Prim Dressing: Hydrofera Blue Ready Transfer Foam, 4x5 (in/in) (Dispense As Written) 1 x Per Day/30 Days ary Discharge Instructions: Apply Hydrofera Blue Ready to wound bed as directed Secondary Dressing: ABD Pad 5x9 (in/in) (Generic) 1 x Per Day/30 Days Discharge Instructions: Cover with ABD  pad Secondary Dressing: Kerlix 4.5 x 4.1 (in/yd) (Generic) 1 x Per Day/30 Days Discharge Instructions: Apply Kerlix 4.5 x 4.1 (in/yd) as instructed Secured With: Medipore T - 75M Medipore H Soft Cloth Surgical T ape ape, 2x2 (in/yd) (Generic) 1 x Per Day/30 Days 1. I am going to recommend that he continue with a knee scooter that still seems to be doing excellent. 2. I am also going to suggest that the patient should continue to monitor for any signs of worsening or infection obviously if anything changes he knows contact the office and let me know otherwise right now he should be changing the Hydrofera Blue every day. We will see patient back for reevaluation in 1 week here in the clinic. If anything worsens or changes patient will contact our office for additional recommendations. Electronic Signature(s) Signed: 10/05/2022 3:09:01 PM By: Lenda Kelp PA-C Entered By: Lenda Kelp on 10/05/2022 15:09:01 -------------------------------------------------------------------------------- SuperBill Details Patient Name: Date of Service: Matthew [name], ROSTEN 10/05/2022 Medical Record Number: [id number] Patient Account Number: [id number] Date of Birth/Sex: Treating RN: 1966/05/18 (56 y.o. 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 Modifier: Quantity: 1 : JATNIEL, VERASTEGUI Code: [id number] EE ([id number]) Description: 11045 - DEB SUBQ TISS EA  ADDL 20CM ICD-10 Diagnosis Description L97.524 Non-pressure 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

## 2022-10-09 NOTE — Progress Notes (Addendum)
[name], [name] ([id number]) [id number] Page 1 of 9 Visit Report for 10/05/2022 Arrival Information Details Patient Name: Date of Service: [name], [name] 10/05/2022 2:30 PM Medical Record Number: [id number] Patient Account Number: [id number] Date of Birth/Sex: Treating RN: 08-27-1966 (56 y.o. [name]) Carlene Coria Primary Care Breshae Belcher: PA Haig Prophet, NO Other Clinician: Referring Kamaria Lucia: Treating Jinger Middlesworth/Extender: Cloretta Ned in Treatment: 16 Visit Information History Since Last Visit All ordered tests and consults were completed: No Patient Arrived: Knee Scooter Added or deleted any medications: No Arrival Time: 14:35 Any new allergies or adverse reactions: No Accompanied By: self Had a fall or experienced change in No Transfer Assistance: None activities of daily living that may affect Patient Identification Verified: Yes risk of falls: Secondary Verification Process Completed: Yes Signs or symptoms of abuse/neglect since last visito No Patient Requires Transmission-Based Precautions: No Hospitalized since last visit: No Patient Has Alerts: No Implantable device outside of the clinic excluding No cellular tissue based products placed in the center since last visit: Pain Present Now: No Electronic Signature(s) Signed: 10/09/2022 9:30:18 AM By: Carlene Coria RN Entered By: Carlene Coria on 10/05/2022 14:39:51 -------------------------------------------------------------------------------- Clinic Level of Care Assessment Details Patient Name: Date of Service: [name], [name] 10/05/2022 2:30 PM Medical Record Number: [id number] Patient Account Number: [id number] Date of Birth/Sex: Treating RN: Oct 12, 1966 (56 y.o. [name]) Carlene Coria Primary Care Neely Cecena: PA Haig Prophet, NO Other Clinician: Referring Darlette Dubow: Treating Saramarie Stinger/Extender: Cloretta Ned in Treatment: 16 Clinic Level of Care Assessment Items TOOL 1 Quantity Score _0  -  0 Use when EandM and Procedure is performed on INITIAL visit ASSESSMENTS - Nursing Assessment / Reassessment _1  - 0 General Physical Exam (combine w/ comprehensive assessment (listed just below) when performed on new pt. evals) _2  - 0 Comprehensive Assessment (HX, ROS, Risk Assessments, Wounds Hx, etc.) [name], [name] ([id number]) [id number] Page 2 of 9 ASSESSMENTS - Wound and Skin Assessment / Reassessment _3  - 0 Dermatologic / Skin Assessment (not related to wound area) ASSESSMENTS - Ostomy and/or Continence Assessment and Care _4  - 0 Incontinence Assessment and Management _5  - 0 Ostomy Care Assessment and Management (repouching, etc.) PROCESS - Coordination of Care _6  - 0 Simple Patient / Family Education for ongoing care _7  - 0 Complex (extensive) Patient / Family Education for ongoing care _8  - 0 Staff obtains Programmer, systems, Records, T Results / Process Orders est _9  - 0 Staff telephones HHA, Nursing Homes / Clarify orders / etc _10  - 0 Routine Transfer to another Facility (non-emergent condition) _11  - 0 Routine Hospital Admission (non-emergent condition) _12  - 0 New Admissions / Biomedical engineer / Ordering NPWT Apligraf, etc. , _13  - 0 Emergency Hospital Admission (emergent condition) PROCESS - Special Needs _14  - 0 Pediatric / Minor Patient Management _15  - 0 Isolation Patient Management _16  - 0 Hearing / Language / Visual special needs _17  - 0 Assessment of Community assistance (transportation, D/C planning, etc.) _18  - 0 Additional assistance / Altered mentation _19  - 0 Support Surface(s) Assessment (bed, cushion, seat, etc.) INTERVENTIONS - Miscellaneous _20  - 0 External ear exam _21  - 0 Patient Transfer (multiple staff / Civil Service fast streamer / Similar devices) _22  - 0 Simple Staple / Suture removal (25 or less) _23  - 0 Complex Staple / Suture removal (26 or more) _24  - 0 Hypo/Hyperglycemic Management (do not check if billed separately) _25  -  0 Ankle / Brachial Index (ABI) - do not check if billed separately Has the patient been seen at the hospital within the last three  years: Yes Total Score: 0 Level Of Care: ____ Electronic Signature(s) Signed: 10/09/2022 9:30:18 AM By: Carlene Coria RN Entered By: Carlene Coria on 10/05/2022 15:02:44 -------------------------------------------------------------------------------- Complex / Palliative Patient Assessment Details Patient Name: Date of Service: [name], [name] 10/05/2022 2:30 PM Medical Record Number: [id number] Patient Account Number: [id number] Date of Birth/Sex: Treating RN: [birth year] (56 y.o. [name] Primary Care Ascher Schroepfer: PA Haig Prophet, [place] Other Clinician: Referring Dinnis Rog: Treating Vihan Santagata/Extender: Cloretta Ned in Treatment: [address], [place] ([id number]) [id number] Page 3 of 9 Complex Wound Management Criteria Patient requires a surgical procedure in order to achieve wound healing: surgeon suggested amputation However, the patient does not wish to undergo the recommended surgical procedure. Palliative Wound Management Criteria Care Approach Wound Care Plan: Complex Wound Management Notes Patient qualified for HBO, but could not make it to the appointments on a regular basis. Patient discontinued visits. Electronic Signature(s) Signed: 10/09/2022 3:35:48 PM By: Gretta Cool, BSN, RN, CWS, Kim RN, BSN Signed: 11/02/2022 5:46:53 PM By: Worthy Keeler PA-C Entered By: Gretta Cool BSN, RN, CWS, Kim on 10/09/2022 15:35:48 -------------------------------------------------------------------------------- Encounter Discharge Information Details Patient Name: Date of Service: [name], [name] 10/05/2022 2:30 PM Medical Record Number: [id number] Patient Account Number: [id number] Date of Birth/Sex: Treating RN: 1966-11-06 (56 y.o. [name] Primary Care Eldridge Marcott: PA Haig Prophet, NO Other Clinician: Referring Jaceon Heiberger: Treating  [place]/Extender: Cloretta Ned in Treatment: 16 Encounter Discharge Information Items Post Procedure Vitals Discharge Condition: Stable Temperature (F): 97.7 Ambulatory Status: Knee Scooter Pulse (bpm): 90 Discharge Destination: Home Respiratory Rate (breaths/min): 16 Transportation: Private Auto Blood Pressure (mmHg): 154/94 Accompanied By: self Schedule Follow-up Appointment: Yes Clinical Summary of Care: Electronic Signature(s) Signed: 10/09/2022 9:30:18 AM By: Carlene Coria RN Entered By: Carlene Coria on 10/05/2022 15:13:51 -------------------------------------------------------------------------------- Lower Extremity Assessment Details Patient Name: Date of Service: [name], [name] 10/05/2022 2:30 PM Medical Record Number: [id number] Patient Account Number: [id number] Date of Birth/Sex: Treating RN: 05/17/1966 ([address] y.o. [name] [place], [place] ([id number]) [phone].pdf Page 4 of 9 Primary Care Nekeya Briski: PA Haig Prophet, NO Other Clinician: Referring Cinch Ormond: Treating Mikeya Tomasetti/Extender: Cloretta Ned in Treatment: 16 Electronic Signature(s) Signed: 10/09/2022 9:30:18 AM By: Carlene Coria RN Entered By: Carlene Coria on 10/05/2022 14:54:37 -------------------------------------------------------------------------------- Multi Wound Chart Details Patient Name: Date of Service: [name], [name] 10/05/2022 2:30 PM Medical Record Number: [id number] Patient Account Number: [id number] Date of Birth/Sex: Treating RN: 1966-11-18 (56 y.o. [name]) Carlene Coria Primary Care Kimmerly Lora: PA Haig Prophet, NO Other Clinician: Referring Jovonne Wilton: Treating Antawn Sison/Extender: Cloretta Ned in Treatment: 16 Vital Signs Height(in): 70 Pulse(bpm): 90 Weight(lbs): 230 Blood Pressure(mmHg): 154/94 Body Mass Index(BMI): 33 Temperature(F): 97.7 Respiratory Rate(breaths/min): 16 [1:Photos:] [N/A:N/A] Left, Plantar Foot N/A  N/A Wound Location: Gradually Appeared N/A N/A Wounding Event: Diabetic Wound/Ulcer of the Lower N/A N/A Primary Etiology: Extremity Type II Diabetes N/A N/A Comorbid History: 12/04/2021 N/A N/A Date Acquired: 16 N/A N/A Weeks of Treatment: Open N/A N/A Wound Status: No N/A N/A Wound Recurrence: Yes N/A N/A Pending A mputation on Presentation: 7x6.9x0.3 N/A N/A Measurements L x W x D (cm) 37.935 N/A N/A A (cm) : rea 11.38 N/A N/A Volume (cm) : 62.30% N/A N/A % Reduction in A rea: 77.40% N/A N/A % Reduction in Volume: Grade 4 N/A N/A Classification: Large N/A N/A Exudate A mount: Serosanguineous N/A N/A Exudate Type: red, brown N/A N/A Exudate Color: Yes N/A N/A Foul Odor A Cleansing: fter No N/A N/A Odor A nticipated Due to Product Use: Medium (  34-66%) N/A N/A Granulation A mount: Pink, Hyper-granulation N/A N/A Granulation Quality: Medium (34-66%) N/A N/A Necrotic A mount: Fat Layer (Subcutaneous Tissue): Yes N/A N/A Exposed Structures: Fascia: No Jurgensen, Filip ([id number]) [id number] Page 5 of 9 Tendon: No Muscle: No Joint: No Bone: No None N/A N/A Epithelialization: Treatment Notes Electronic Signature(s) Signed: 10/09/2022 9:30:18 AM By: Carlene Coria RN Entered By: Carlene Coria on 10/05/2022 14:52:56 -------------------------------------------------------------------------------- Multi-Disciplinary Care Plan Details Patient Name: Date of Service: [name], [name] 10/05/2022 2:30 PM Medical Record Number: [id number] Patient Account Number: [id number] Date of Birth/Sex: Treating RN: Jul 17, 1966 (56 y.o. [name]) Carlene Coria Primary Care Jareb Radoncic: PA Haig Prophet, NO Other Clinician: Referring Ange Puskas: Treating Celica Kotowski/Extender: Cloretta Ned in Treatment: 16 Active Inactive Osteomyelitis Nursing Diagnoses: Infection: osteomyelitis Knowledge deficit related to disease process and management Potential for  infection: osteomyelitis Goals: Diagnostic evaluation for osteomyelitis completed as ordered Date Initiated: 06/28/2022 Date Inactivated: 08/03/2022 Target Resolution Date: 06/28/2022 Goal Status: Met Patient/caregiver will verbalize understanding of disease process and disease management Date Initiated: 06/28/2022 Date Inactivated: 08/31/2022 Target Resolution Date: 06/28/2022 Goal Status: Met Patient's osteomyelitis will resolve Date Initiated: 06/28/2022 Target Resolution Date: 06/28/2022 Goal Status: Active Signs and symptoms for osteomyelitis will be recognized and promptly addressed Date Initiated: 06/28/2022 Date Inactivated: 08/03/2022 Target Resolution Date: 06/28/2022 Goal Status: Met Interventions: Assess for signs and symptoms of osteomyelitis resolution every visit Provide education on osteomyelitis Screen for HBO Notes: Wound/Skin Impairment Nursing Diagnoses: Knowledge deficit related to ulceration/compromised skin integrity Goals: Patient/caregiver will verbalize understanding of skin care regimen Date Initiated: 06/15/2022 Date Inactivated: 08/03/2022 Target Resolution Date: 07/16/2022 SHERON, ROBIN ([id number]) [id number] Page 6 of 9 Goal Status: Met Ulcer/skin breakdown will have a volume reduction of 30% by week 4 Date Initiated: 06/15/2022 Date Inactivated: 08/03/2022 Target Resolution Date: 07/16/2022 Goal Status: Unmet Unmet Reason: comorbities Ulcer/skin breakdown will have a volume reduction of 50% by week 8 Date Initiated: 06/15/2022 Date Inactivated: 08/31/2022 Target Resolution Date: 08/16/2022 Goal Status: Unmet Unmet Reason: comorbdities Ulcer/skin breakdown will have a volume reduction of 80% by week 12 Date Initiated: 06/15/2022 Target Resolution Date: 09/15/2022 Goal Status: Active Ulcer/skin breakdown will heal within 14 weeks Date Initiated: 06/15/2022 Target Resolution Date: 10/16/2022 Goal Status:  Active Interventions: Assess patient/caregiver ability to obtain necessary supplies Assess patient/caregiver ability to perform ulcer/skin care regimen upon admission and as needed Assess ulceration(s) every visit Notes: Electronic Signature(s) Signed: 10/09/2022 9:30:18 AM By: Carlene Coria RN Entered By: Carlene Coria on 10/05/2022 14:52:48 -------------------------------------------------------------------------------- Pain Assessment Details Patient Name: Date of Service: [name], [name] 10/05/2022 2:30 PM Medical Record Number: [id number] Patient Account Number: [id number] Date of Birth/Sex: Treating RN: Dec 30, 1965 (56 y.o. [name] Primary Care Walt Geathers: PA Haig Prophet, [place] Other Clinician: Referring Jahnay Lantier: Treating Malyk Girouard/Extender: Cloretta Ned in Treatment: 16 Active Problems Location of Pain Severity and Description of Pain Patient Has Paino No Site Locations Pain Management and Medication Current Pain Management: [name], [name] ([id number]) [id number] Page 7 of 9 Electronic Signature(s) Signed: 10/09/2022 9:30:18 AM By: Carlene Coria RN Entered By: Carlene Coria on 10/05/2022 14:46:03 -------------------------------------------------------------------------------- Patient/Caregiver Education Details Patient Name: Date of Service: [name], [name] 11/2/2023andnbsp2:30 PM Medical Record Number: [id number] Patient Account Number: [id number] Date of Birth/Gender: Treating RN: 1966-01-08 (56 y.o. [name] Primary Care Physician: PA Haig Prophet, [place] Other Clinician: Referring Physician: Treating Physician/Extender: Cloretta Ned in Treatment: 16 Education Assessment Education Provided To: Patient Education Topics Provided Wound Debridement: Handouts: Wound Debridement Methods: Explain/Verbal Responses: State content correctly  Wound/Skin Impairment: Handouts: Caring for Your Ulcer Methods:  Explain/Verbal Responses: State content correctly Electronic Signature(s) Signed: 10/09/2022 9:30:18 AM By: Carlene Coria RN Entered By: Carlene Coria on 10/05/2022 15:04:07 -------------------------------------------------------------------------------- Wound Assessment Details Patient Name: Date of Service: [name], [name] 10/05/2022 2:30 PM Medical Record Number: [id number] Patient Account Number: [id number] Date of Birth/Sex: Treating RN: 1966/08/02 (56 y.o. [name] Primary Care Tyria Springer: PA Haig Prophet, [place] Other Clinician: Referring Evlyn Amason: Treating Saria Haran/Extender: Raynaldo Opitz Weeks in Treatment: 16 Wound Status Wound Number: 1 Primary Etiology: Diabetic Wound/Ulcer of the [place], Babe ([id number]) [id number] Page 8 of 9 Wound Location: Left, Plantar Foot Wound Status: Open Wounding Event: Gradually Appeared Comorbid History: Type II Diabetes Date Acquired: 12/04/2021 Weeks Of Treatment: 16 Clustered Wound: No Pending Amputation On Presentation Photos Wound Measurements Length: (cm) 7 Width: (cm) 6.9 Depth: (cm) 0.3 Area: (cm) 37.935 Volume: (cm) 11.38 % Reduction in Area: 62.3% % Reduction in Volume: 77.4% Epithelialization: None Wound Description Classification: Grade 4 Exudate Amount: Large Exudate Type: Serosanguineous Exudate Color: red, brown Foul Odor After Cleansing: Yes Due to Product Use: No Slough/Fibrino Yes Wound Bed Granulation Amount: Medium (34-66%) Exposed Structure Granulation Quality: Pink, Hyper-granulation Fascia Exposed: No Necrotic Amount: Medium (34-66%) Fat Layer (Subcutaneous Tissue) Exposed: Yes Necrotic Quality: Adherent Slough Tendon Exposed: No Muscle Exposed: No Joint Exposed: No Bone Exposed: No Electronic Signature(s) Signed: 10/09/2022 9:30:18 AM By: Carlene Coria RN Entered By: Carlene Coria on 10/05/2022  14:52:23 -------------------------------------------------------------------------------- Vitals Details Patient Name: Date of Service: [name], [name] 10/05/2022 2:30 PM Medical Record Number: [id number] Patient Account Number: [id number] Date of Birth/Sex: Treating RN: [birth year] (56 y.o. [name] Primary Care Evana Runnels: PA Haig Prophet, NO Other Clinician: Referring Kanoe Wanner: Treating Shaquala Broeker/Extender: Cloretta Ned in Treatment: 16 Vital Signs Time Taken: 14:39 Temperature (F): 97.7 Height (in): 70 Pulse (bpm): Hardy, Vicente ([id number]) [id number] Page 9 of 9 Weight (lbs): 230 Respiratory Rate (breaths/min): 16 Body Mass Index (BMI): 33 Blood Pressure (mmHg): 154/94 Reference Range: 80 - 120 mg / dl Electronic Signature(s) Signed: 10/09/2022 9:30:18 AM By: Carlene Coria RN Entered By: Carlene Coria on 10/05/2022 14:45:55

## 2022-10-12 ENCOUNTER — Encounter: Payer: 59 | Admitting: Internal Medicine

## 2022-10-16 ENCOUNTER — Ambulatory Visit: Payer: 59 | Admitting: Pharmacist

## 2022-10-24 ENCOUNTER — Ambulatory Visit: Payer: 59 | Attending: Family Medicine | Admitting: Pharmacist

## 2022-10-24 ENCOUNTER — Other Ambulatory Visit: Payer: Self-pay

## 2022-10-24 ENCOUNTER — Encounter: Payer: Self-pay | Admitting: Pharmacist

## 2022-10-24 DIAGNOSIS — E1169 Type 2 diabetes mellitus with other specified complication: Secondary | ICD-10-CM

## 2022-10-24 DIAGNOSIS — Z794 Long term (current) use of insulin: Secondary | ICD-10-CM | POA: Diagnosis not present

## 2022-10-24 MED ORDER — METFORMIN HCL ER 500 MG PO TB24
500.0000 mg | ORAL_TABLET | Freq: Two times a day (BID) | ORAL | 2 refills | Status: DC
  Filled 2022-10-24: qty 120, 30d supply, fill #0

## 2022-10-24 MED ORDER — TRULICITY 1.5 MG/0.5ML ~~LOC~~ SOAJ
1.5000 mg | SUBCUTANEOUS | 1 refills | Status: DC
  Filled 2022-10-24: qty 2, 28d supply, fill #0

## 2022-10-24 NOTE — Progress Notes (Signed)
    S:     No chief complaint on file.  56 y.o. male who presents for diabetes evaluation, education, and management.  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  Patient was referred and last seen by Primary Care Provider, Dr. Alvis Lemmings, on 09/11/2022.   At last visit, his A1c was found to be 9.6%. Trulicity was started and metformin dose was increased.   Today, patient arrives in good spirits and presents without any assistance.Patient reports Diabetes is longstanding. He is tolerating the Trulicity well. Denies any NV, abdominal pain. No acute changes in vision since starting the medication.  Family/Social History:  FHX: no pertinent positives listed  Tobacco: current 0.25 PPD smoker  Alcohol: none reported   Current diabetes medications include: metformin 1000 mg BID, insulin NPH 20u BID, Trulicity 0.75 mg weekly   Patient reports adherence to taking all medications as prescribed.   Insurance coverage: [employer]  Patient denies hypoglycemic events.  Reported home fasting blood sugars: reports 90s-low 100s.  Reported 2 hour post-meal/random blood sugars: not checking.  Patient reports nocturia (nighttime urination).  Patient reports neuropathy (nerve pain). Patient denies visual changes. Patient reports self foot exams.   Patient reported dietary habits:  -Tries to do the best he can.  -Does not purchase bread or sweets from the grocery store.   Patient-reported exercise habits:  -Limited. Recovering from a diabetic infection of the L foot.    O:   ROS  Physical Exam   Lab Results  Component Value Date   HGBA1C 9.6 (A) 09/11/2022   There were no vitals filed for this visit.  Lipid Panel     Component Value Date/Time   CHOL 101 12/14/2021 1239   TRIG 134 12/14/2021 1239   HDL 29 (L) 12/14/2021 1239   CHOLHDL 3.5 12/14/2021 1239    VLDL 27 12/14/2021 1239   LDLCALC 45 12/14/2021 1239    Clinical Atherosclerotic Cardiovascular Disease (ASCVD): No  The ASCVD Risk score (Arnett DK, et al., 2019) failed to calculate for the following reasons:   The valid total cholesterol range is 130 to 320 mg/dL    A/P: Diabetes longstanding currently uncontrolled based on A1c. Patient is able to verbalize appropriate hypoglycemia management plan. Medication adherence appears appropriate. -Increased dose of Trulicity to 1.5 mg weekly.  -Change metformin to 500 mg XR form d/t diarrhea with regular release. Start with 500 mg XR BID x1 week then increase to 2 tablets (1000mg ) BID. -Continue insulin at current dose.   -Patient educated on purpose, proper use, and potential adverse effects of Trulicity.  -Extensively discussed pathophysiology of diabetes, recommended lifestyle interventions, dietary effects on blood sugar control.  -Counseled on s/sx of and management of hypoglycemia.  -Next A1c anticipated 12/2022.   Written patient instructions provided. Patient verbalized understanding of treatment plan.  Total time in face to face counseling 20 minutes.    Follow-up:  Pharmacist prn. PCP clinic visit in Jan.  Feb, PharmD, Butch Penny, CPP Clinical Pharmacist [facility] [phone]

## 2022-10-30 ENCOUNTER — Other Ambulatory Visit: Payer: Self-pay

## 2022-10-31 ENCOUNTER — Other Ambulatory Visit
Admission: RE | Admit: 2022-10-31 | Discharge: 2022-10-31 | Disposition: A | Discharge status: Home / Self Care | Payer: 59 | Source: Ambulatory Visit | Attending: Infectious Diseases | Admitting: Infectious Diseases

## 2022-10-31 ENCOUNTER — Encounter: Payer: Self-pay | Admitting: Infectious Diseases

## 2022-10-31 ENCOUNTER — Ambulatory Visit: Payer: 59 | Attending: Infectious Diseases | Admitting: Infectious Diseases

## 2022-10-31 VITALS — BP 123/88 | HR 106 | Temp 97.3°F

## 2022-10-31 DIAGNOSIS — Z7984 Long term (current) use of oral hypoglycemic drugs: Secondary | ICD-10-CM | POA: Diagnosis not present

## 2022-10-31 DIAGNOSIS — Z794 Long term (current) use of insulin: Secondary | ICD-10-CM | POA: Diagnosis not present

## 2022-10-31 DIAGNOSIS — L089 Local infection of the skin and subcutaneous tissue, unspecified: Secondary | ICD-10-CM

## 2022-10-31 DIAGNOSIS — E11628 Type 2 diabetes mellitus with other skin complications: Secondary | ICD-10-CM | POA: Insufficient documentation

## 2022-10-31 DIAGNOSIS — Z4800 Encounter for change or removal of nonsurgical wound dressing: Secondary | ICD-10-CM | POA: Insufficient documentation

## 2022-10-31 DIAGNOSIS — E1165 Type 2 diabetes mellitus with hyperglycemia: Secondary | ICD-10-CM | POA: Diagnosis not present

## 2022-10-31 LAB — CBC WITH DIFFERENTIAL/PLATELET
Abs Immature Granulocytes: 0.03 10*3/uL (ref 0.00–0.07)
Basophils Absolute: 0 10*3/uL (ref 0.0–0.1)
Basophils Relative: 0 %
Eosinophils Absolute: 0.2 10*3/uL (ref 0.0–0.5)
Eosinophils Relative: 2 %
HCT: 37.7 % — ABNORMAL LOW (ref 39.0–52.0)
Hemoglobin: 12.1 g/dL — ABNORMAL LOW (ref 13.0–17.0)
Immature Granulocytes: 0 %
Lymphocytes Relative: 35 %
Lymphs Abs: 2.9 10*3/uL (ref 0.7–4.0)
MCH: 25.9 pg — ABNORMAL LOW (ref 26.0–34.0)
MCHC: 32.1 g/dL (ref 30.0–36.0)
MCV: 80.6 fL (ref 80.0–100.0)
Monocytes Absolute: 0.6 10*3/uL (ref 0.1–1.0)
Monocytes Relative: 7 %
Neutro Abs: 4.6 10*3/uL (ref 1.7–7.7)
Neutrophils Relative %: 56 %
Platelets: 388 10*3/uL (ref 150–400)
RBC: 4.68 MIL/uL (ref 4.22–5.81)
RDW: 14.3 % (ref 11.5–15.5)
WBC: 8.2 10*3/uL (ref 4.0–10.5)
nRBC: 0 % (ref 0.0–0.2)

## 2022-10-31 LAB — COMPREHENSIVE METABOLIC PANEL
ALT: 12 U/L (ref 0–44)
AST: 14 U/L — ABNORMAL LOW (ref 15–41)
Albumin: 3.5 g/dL (ref 3.5–5.0)
Alkaline Phosphatase: 72 U/L (ref 38–126)
Anion gap: 7 (ref 5–15)
BUN: 15 mg/dL (ref 6–20)
CO2: 28 mmol/L (ref 22–32)
Calcium: 8.9 mg/dL (ref 8.9–10.3)
Chloride: 102 mmol/L (ref 98–111)
Creatinine, Ser: 1.11 mg/dL (ref 0.61–1.24)
GFR, Estimated: >60 mL/min (ref >60)
Glucose, Bld: 264 mg/dL — ABNORMAL HIGH (ref 70–99)
Potassium: 4.1 mmol/L (ref 3.5–5.1)
Sodium: 137 mmol/L (ref 135–145)
Total Bilirubin: 0.4 mg/dL (ref 0.3–1.2)
Total Protein: 7.3 g/dL (ref 6.5–8.1)

## 2022-10-31 LAB — SEDIMENTATION RATE: Sed Rate: 34 mm/hr — ABNORMAL HIGH (ref 0–20)

## 2022-10-31 LAB — C-REACTIVE PROTEIN: CRP: 0.9 mg/dL (ref <1.0)

## 2022-10-31 NOTE — Progress Notes (Signed)
NAME: [name]  DOB: [birth year]  MRN: [id number]  Date/Time: 10/31/2022 9:03 AM  Subjective:   ?follow up visit For diabetic foot infection -left foot- doing better On triple antibiotics x 4 months Going to the wound clinic Following taken from previous notes PMH, SH, FH, ROS REVIEWED  [name] is a 56 y.o. with a history of Dm,last ba1c in June was 11.2, peripheral neuropathy with left foot wound for the past 9 months followed at wound clinic   On 6/1 MR left foot showed concern for early osteomyelitis along plantar side of 5th metatarsal, extensive gas in the plantar soft tissue  Taken to OR on 6/2(Ortho, Dr. Bradd Canary) for left foot debridement and found to have extensive necrotic soft tissue and muscle, tissue sent for Cx.Klebsiella oxytoca, MSSA, strep gordonii, strep cellulitis, Prevotella species beta-lactamase positive. -He refused amputation- He was followed by [facility] infectious disease was sent on 6 weeks of IV . Cefazolin and metronidazole and levaquin. He had a repeat MRI on 06/29/22 and showed Septic arthritis involving the second, third and fourth MTP joints with associated osteomyelitis involving the metatarsals and proximal phalanges.   HE was restarted on Po cefadroxil, levaquin , metronidazole- asked to go for amputation- He came to [facility] wouldn clinic and was started hyperbaric oxygen and came to see me a month ago for 2nd opinion- HE is doing better HE took 9 sessions of HBO and stopped as he could not keep the appts He has no PCP His endocrinologist is Dr. Alvis Lemmings- [place] Last Hba1c was 11 He is c/o left foot pain-  Past Medical History:  Diagnosis Date   Diabetes mellitus without complication [facility])     Past Surgical History:  Procedure Laterality Date   I & D EXTREMITY Left 05/05/2022   Procedure: LEFT FOOT DEBRIDEMENT;  Surgeon: Nadara Mustard, MD;  Location: [facility] OR;  Service: Orthopedics;  Laterality: Left;   MYRINGOTOMY WITH TUBE PLACEMENT Bilateral 07/20/2022    Procedure: MYRINGOTOMY WITH TUBE PLACEMENT;  Surgeon: Vernie Murders, MD;  Location: [facility];  Service: ENT;  Laterality: Bilateral;  Diabetic   TONSILLECTOMY      Social History   Socioeconomic History   Marital status: Single    Spouse name: Not on file   Number of children: Not on file   Years of education: Not on file   Highest education level: Not on file  Occupational History   Not on file  Tobacco Use   Smoking status: Every Day    Packs/day: 0.25    Years: 25.00    Total pack years: 6.25    Types: Cigarettes   Smokeless tobacco: Never   Tobacco comments:    Cutting back - goes through a pack in 4-6 days (Started around age 46)  Vaping Use   Vaping Use: Never used  Substance and Sexual Activity   Alcohol use: No   Drug use: No   Sexual activity: Yes  Other Topics Concern   Not on file  Social History Narrative   Not on file   Social Determinants of Health   Financial Resource Strain: Not on file  Food Insecurity: Not on file  Transportation Needs: Not on file  Physical Activity: Not on file  Stress: Not on file  Social Connections: Not on file  Intimate Partner Violence: Not on file    FH Sister, mother, grandmother - DM Brother- colon cancer  No Known Allergies  Current Outpatient Medications  Medication Sig Dispense Refill  acetaminophen (TYLENOL) 500 MG tablet Take 2,000 mg by mouth every 6 (six) hours as needed for mild pain or moderate pain.     atorvastatin (LIPITOR) 20 MG tablet Take 1 tablet (20 mg total) by mouth daily. 90 tablet 1   cefadroxil (DURICEF) 500 MG capsule Take 1 capsule (500 mg total) by mouth 2 (two) times daily. 60 capsule 1   ciprofloxacin-dexamethasone (CIPRODEX) OTIC suspension Place 4 drops in both ears twice a day for 5 days 7.5 mL 0   Continuous Blood Gluc Transmit (DEXCOM G6 TRANSMITTER) MISC Use to check blood sugar three times daily. Change transmitter once every 909 days. E11.69 1 each 1   Dulaglutide  (TRULICITY) 1.5 MG/0.5ML SOPN Inject 1.5 mg into the skin once a week. 6 mL 1   gabapentin (NEURONTIN) 300 MG capsule Take 1 capsule (300 mg total) by mouth at bedtime. 30 capsule 3   Insulin NPH, Human,, Isophane, (HUMULIN N KWIKPEN) 100 UNIT/ML Kiwkpen Inject 20 Units into the skin 2 (two) times daily. 30 mL 5   Insulin Pen Needle 32G X 4 MM MISC Use as directed to inject insulin up to 4 times daily. 100 each 0   levofloxacin (LEVAQUIN) 750 MG tablet Take 1 tablet (750 mg total) by mouth daily. 30 tablet 1   metFORMIN (GLUCOPHAGE-XR) 500 MG 24 hr tablet Take 1 tablet (500 mg total) by mouth 2 (two) times daily with a meal. After 1 week, increase to 2 tablets (1000mg ) 2 times daily if tolerable. 120 tablet 2   methocarbamol (ROBAXIN) 500 MG tablet Take 1 tablet (500 mg total) by mouth every 6 (six) hours as needed for muscle spasms. 30 tablet 0   metroNIDAZOLE (FLAGYL) 500 MG tablet Take 1 tablet (500 mg total) by mouth 2 (two) times daily. 60 tablet 1   Semaglutide,0.25 or 0.5MG /DOS, (OZEMPIC, 0.25 OR 0.5 MG/DOSE,) 2 MG/1.5ML SOPN Inject into the skin.     No current facility-administered medications for this visit.     Abtx:  Anti-infectives (From admission, onward)    None       REVIEW OF SYSTEMS:  Const: negative fever, negative chills, negative weight loss Eyes: negative diplopia or visual changes, negative eye pain ENT: negative coryza, negative sore throat Resp: negative cough, hemoptysis, dyspnea Cards: negative for chest pain, palpitations, lower extremity edema GU: negative for frequency, dysuria and hematuria GI: Negative for abdominal pain, diarrhea, bleeding, constipation Skin: negative for rash and pruritus Heme: negative for easy bruising and gum/nose bleeding foot pain MUCH IMPROved- not on any meds now Neurolo:negative for headaches, dizziness, vertigo, memory problems  Psych: negative for feelings of anxiety, depression  Endocrine:  diabetes- got a new pcp  and on metformin xr Allergy/Immunology- negative for any medication or food allergies ?  Objective:  VITALS:  BP 123/88   Pulse (!) 106   Temp (!) 97.3 F (36.3 C) (Temporal)   PHYSICAL EXAM:  General: Alert, cooperative, no distress, appears stated age.  Lungs: Clear to auscultation bilaterally. No Wheezing or Rhonchi. No rales. Heart: Regular rate and rhythm, no murmur, rub or gallop. Abdomen: Soft, non-tender,not distended. Bowel sounds normal. No masses Extremities:   10/31/22  08/29/22   08/01/22   07/10/22   07/04/22   06/20/22   05/18/22   05/09/22   05/04/22    Skin: No rashes or lesions. Or bruising Lymph: Cervical, supraclavicular normal. Neurologic: peripheral neuropathy Pertinent Labs   ? Impression/Recommendation DM poorly controlled with peripheral neuropathy with left foot  wound with underlying osteomyelitis- debridement in June  Was told to get amputation, wanted to save his foot at all cost- so transferred care ot [facility] wound center- started hyperbaric oxygen- and got 9 and then stopped as he could not go there 5 times a week  Wound improving significantly Labs today ? Is on cefadroxil, levaquin and flagyl X 4 months now As no infection currently, will stop antibiotics and follow up in 4 weeks  At the first site of infection will restart  DM- has a new PCP and is on metformin XR Hba1c was 11.2-  Discussed healthy eating, foot care  Follow up 1 month or earlier

## 2022-10-31 NOTE — Patient Instructions (Signed)
You are here for follow up of the left foot infection- you are now completing 4 months of po cefadroxil, levaquin and flagyl- we can discontinue and watch closely- will do labs today and follow up in 1 month

## 2022-11-02 ENCOUNTER — Encounter: Payer: 59 | Admitting: Physician Assistant

## 2022-11-02 ENCOUNTER — Telehealth: Payer: Self-pay

## 2022-11-02 DIAGNOSIS — L97522 Non-pressure chronic ulcer of other part of left foot with fat layer exposed: Secondary | ICD-10-CM | POA: Diagnosis not present

## 2022-11-02 DIAGNOSIS — Z87891 Personal history of nicotine dependence: Secondary | ICD-10-CM | POA: Diagnosis not present

## 2022-11-02 DIAGNOSIS — M86672 Other chronic osteomyelitis, left ankle and foot: Secondary | ICD-10-CM | POA: Diagnosis not present

## 2022-11-02 DIAGNOSIS — E11621 Type 2 diabetes mellitus with foot ulcer: Secondary | ICD-10-CM | POA: Diagnosis not present

## 2022-11-02 DIAGNOSIS — L97524 Non-pressure chronic ulcer of other part of left foot with necrosis of bone: Secondary | ICD-10-CM | POA: Diagnosis not present

## 2022-11-02 NOTE — Telephone Encounter (Signed)
I reached out to the patient to relay lab results and patient did not answer. Patient voicemail was full and I was unable to leave a voicemail. [name] T Pricilla Loveless

## 2022-11-02 NOTE — Telephone Encounter (Signed)
-----   Message from Lynn Ito, MD sent at 11/02/2022  2:03 PM EST ----- Please let him know that other than blood sugar which is high , everything looks fine- the inflammatory markers are good as well

## 2022-11-02 NOTE — Progress Notes (Signed)
RICE, WALSH ([id number]) [id number] Page 1 of 9 Visit Report for 11/02/2022 Arrival Information Details Patient Name: Date of Service: Matthew [name], [name] 11/02/2022 3:30 PM Medical Record Number: [id number] Patient Account Number: [id number] Date of Birth/Sex: Treating RN: 09-May-1966 (56 y.o. Matthew [name]) Carlene Coria Primary Care Saya Mccoll: PA Haig Prophet, NO Other Clinician: Referring [name]: Treating Howie Rufus/Extender: Cloretta Ned in Treatment: 20 Visit Information History Since Last Visit All ordered tests and consults were completed: No Patient Arrived: Knee Scooter Added or deleted any medications: No Arrival Time: 15:38 Any new allergies or adverse reactions: No Accompanied By: self Had a fall or experienced change in No Transfer Assistance: None activities of daily living that may affect Patient Identification Verified: Yes risk of falls: Secondary Verification Process Completed: Yes Signs or symptoms of abuse/neglect since last visito No Patient Requires Transmission-Based Precautions: No Hospitalized since last visit: No Patient Has Alerts: No Implantable device outside of the clinic excluding No cellular tissue based products placed in the center since last visit: Has Dressing in Place as Prescribed: Yes Pain Present Now: No Electronic Signature(s) Signed: 11/02/2022 4:40:57 PM By: Carlene Coria RN Entered By: Carlene Coria on 11/02/2022 15:42:22 -------------------------------------------------------------------------------- Clinic Level of Care Assessment Details Patient Name: Date of Service: Matthew, [name] 11/02/2022 3:30 PM Medical Record Number: [id number] Patient Account Number: [id number] Date of Birth/Sex: Treating RN: [birth year] (56 y.o. Matthew [name]) Carlene Coria Primary Care Nastassia Bazaldua: PA Haig Prophet, NO Other Clinician: Referring Kamrynn Melott: Treating Shalev Helminiak/Extender: Cloretta Ned in Treatment: 20 Clinic Level of Care  Assessment Items TOOL 4 Quantity Score X- 1 0 Use when only an EandM is performed on FOLLOW-UP visit ASSESSMENTS - Nursing Assessment / Reassessment X- 1 10 Reassessment of Co-morbidities (includes updates in patient status) X- 1 5 Reassessment of Adherence to Treatment Plan [name], [name] ([id number]) [id number] Page 2 of 9 ASSESSMENTS - Wound and Skin A ssessment / Reassessment X - Simple Wound Assessment / Reassessment - one wound 1 5 _0  - 0 Complex Wound Assessment / Reassessment - multiple wounds _1  - 0 Dermatologic / Skin Assessment (not related to wound area) ASSESSMENTS - Focused Assessment _2  - 0 Circumferential Edema Measurements - multi extremities _3  - 0 Nutritional Assessment / Counseling / Intervention _4  - 0 Lower Extremity Assessment (monofilament, tuning fork, pulses) _5  - 0 Peripheral Arterial Disease Assessment (using hand held doppler) ASSESSMENTS - Ostomy and/or Continence Assessment and Care _6  - 0 Incontinence Assessment and Management _7  - 0 Ostomy Care Assessment and Management (repouching, etc.) PROCESS - Coordination of Care X - Simple Patient / Family Education for ongoing care 1 15 _8  - 0 Complex (extensive) Patient / Family Education for ongoing care _9  - 0 Staff obtains Programmer, systems, Records, T Results / Process Orders est _10  - 0 Staff telephones HHA, Nursing Homes / Clarify orders / etc _11  - 0 Routine Transfer to another Facility (non-emergent condition) _12  - 0 Routine Hospital Admission (non-emergent condition) _13  - 0 New Admissions / Biomedical engineer / Ordering NPWT Apligraf, etc. , _14  - 0 Emergency Hospital Admission (emergent condition) _15  - 0 Simple Discharge Coordination _16  - 0 Complex (extensive) Discharge Coordination PROCESS - Special Needs _17  - 0 Pediatric / Minor Patient Management _18  - 0 Isolation Patient Management _19  - 0 Hearing / Language / Visual special needs _20  - 0 Assessment of  Community assistance (transportation, D/C planning, etc.) _21  - 0 Additional assistance / Altered mentation _22  - 0 Support Surface(s) Assessment (bed, cushion, seat, etc.) INTERVENTIONS - Wound Cleansing /  Measurement X - Simple Wound Cleansing - one wound 1 5 _0  - 0 Complex Wound Cleansing - multiple wounds X- 1 5 Wound Imaging (photographs - any number of wounds) _1  - 0 Wound Tracing (instead of photographs) X- 1 5 Simple Wound Measurement - one wound _2  - 0 Complex Wound Measurement - multiple wounds INTERVENTIONS - Wound Dressings X - Small Wound Dressing one or multiple wounds 1 10 _3  - 0 Medium Wound Dressing one or multiple wounds _4  - 0 Large Wound Dressing one or multiple wounds <YKDXIPJASNKNLZJQ>_7<\/HALPFXTKWIOXBDZH>_2  - 0 Application of Medications - topical <DJMEQASTMHDQQIWL>_7<\/LGXQJJHERDEYCXKG>_8  - 0 Application of Medications - injection INTERVENTIONS - Miscellaneous _7  - 0 External ear exam ATKISON, Creig ([id number]) [id number] Page 3 of 9 _8  - 0 Specimen Collection (cultures, biopsies, blood, body fluids, etc.) _9  - 0 Specimen(s) / Culture(s) sent or taken to Lab for analysis _10  - 0 Patient Transfer (multiple staff / Harrel Lemon Lift / Similar devices) _11  - 0 Simple Staple / Suture removal (25 or less) _12  - 0 Complex Staple / Suture removal (26 or more) _13  - 0 Hypo / Hyperglycemic Management (close monitor of Blood Glucose) _14  - 0 Ankle / Brachial Index (ABI) - do not check if billed separately X- 1 5 Vital Signs Has the patient been seen at the hospital within the last three years: Yes Total Score: 65 Level Of Care: New/Established - Level 2 Electronic Signature(s) Signed: 11/02/2022 4:40:57 PM By: Carlene Coria RN Entered By: Carlene Coria on 11/02/2022 15:55:52 -------------------------------------------------------------------------------- Encounter Discharge Information Details Patient Name: Date of Service: Matthew ALMER, [name] 11/02/2022 3:30 PM Medical Record Number: [id number] Patient Account Number:  [id number] Date of Birth/Sex: Treating RN: 09-Jan-1966 (56 y.o. [name] Primary Care Jivan Symanski: PA Haig Prophet, NO Other Clinician: Referring Taleeyah Bora: Treating Chavonne Sforza/Extender: Cloretta Ned in Treatment: 20 Encounter Discharge Information Items Discharge Condition: Stable Ambulatory Status: Ambulatory Discharge Destination: Home Transportation: Private Auto Accompanied By: self Schedule Follow-up Appointment: Yes Clinical Summary of Care: Electronic Signature(s) Signed: 11/02/2022 4:40:57 PM By: Carlene Coria RN Entered By: Carlene Coria on 11/02/2022 15:56:55 Lower Extremity Assessment Details -------------------------------------------------------------------------------- Matthew [name] ([id number]) [id number] Page 4 of 9 Patient Name: Date of Service: Matthew KAYSHAWN, OZBURN 11/02/2022 3:30 PM Medical Record Number: [id number] Patient Account Number: [id number] Date of Birth/Sex: Treating RN: 04-09-1966 (56 y.o. Matthew [name]) Carlene Coria Primary Care Thurmond Hildebran: PA Haig Prophet, [place] Other Clinician: Referring D'Arcy Abraha: Treating Genea Rheaume/Extender: Cloretta Ned in Treatment: 20 Electronic Signature(s) Signed: 11/02/2022 4:40:57 PM By: Carlene Coria RN Entered By: Carlene Coria on 11/02/2022 15:49:42 -------------------------------------------------------------------------------- Multi Wound Chart Details Patient Name: Date of Service: Matthew VALTON, [name] 11/02/2022 3:30 PM Medical Record Number: [id number] Patient Account Number: [id number] Date of Birth/Sex: Treating RN: September 06, 1966 (56 y.o. Matthew [name]) Carlene Coria Primary Care Eryk Beavers: PA Haig Prophet, NO Other Clinician: Referring Tonny Isensee: Treating Tracy Gerken/Extender: Cloretta Ned in Treatment: 20 Vital Signs Height(in): 70 Pulse(bpm): 95 Weight(lbs): 230 Blood Pressure(mmHg): 142/89 Body Mass Index(BMI): 33 Temperature(F): 98.1 Respiratory Rate(breaths/min): 18 [1:Photos:]  [N/A:N/A] Left, Plantar Foot N/A N/A Wound Location: Gradually Appeared N/A N/A Wounding Event: Diabetic Wound/Ulcer of the Lower N/A N/A Primary Etiology: Extremity Type II Diabetes N/A N/A Comorbid History: 12/04/2021 N/A N/A Date Acquired: 20 N/A N/A Weeks of Treatment: Open N/A N/A Wound Status: No N/A N/A Wound Recurrence: Yes N/A N/A Pending A mputation on Presentation: 9x6.5x0.3 N/A N/A Measurements L x W x D (cm) 45.946 N/A N/A A (cm) : rea 13.784 N/A N/A Volume (cm) : 54.30% N/A N/A %  Reduction in A rea: 72.60% N/A N/A % Reduction in Volume: Grade 4 N/A N/A Classification: Medium N/A N/A Exudate A mount: Serosanguineous N/A N/A Exudate Type: red, brown N/A N/A Exudate Color: Medium (34-66%) N/A N/A Granulation A mount: Pink, Hyper-granulation N/A N/A Granulation Quality: Medium (34-66%) N/A N/A Necrotic A mount: Fat Layer (Subcutaneous Tissue): Yes N/A N/A Exposed Structures: Fascia: No Burnsed, Eluterio ([id number]) [id number] Page 5 of 9 Tendon: No Muscle: No Joint: No Bone: No None N/A N/A Epithelialization: Treatment Notes Electronic Signature(s) Signed: 11/02/2022 4:40:57 PM By: Carlene Coria RN Entered By: Carlene Coria on 11/02/2022 15:49:57 -------------------------------------------------------------------------------- Multi-Disciplinary Care Plan Details Patient Name: Date of Service: Matthew HARUTYUN, MONTEVERDE 11/02/2022 3:30 PM Medical Record Number: [id number] Patient Account Number: [id number] Date of Birth/Sex: Treating RN: Oct 05, 1966 (56 y.o. Matthew [name]) Carlene Coria Primary Care Kathyrn Warmuth: PA Haig Prophet, NO Other Clinician: Referring Michaella Imai: Treating Olusegun Gerstenberger/Extender: Cloretta Ned in Treatment: 20 Active Inactive Osteomyelitis Nursing Diagnoses: Infection: osteomyelitis Knowledge deficit related to disease process and management Potential for infection: osteomyelitis Goals: Diagnostic evaluation for  osteomyelitis completed as ordered Date Initiated: 06/28/2022 Date Inactivated: 08/03/2022 Target Resolution Date: 06/28/2022 Goal Status: Met Patient/caregiver will verbalize understanding of disease process and disease management Date Initiated: 06/28/2022 Date Inactivated: 08/31/2022 Target Resolution Date: 06/28/2022 Goal Status: Met Patient's osteomyelitis will resolve Date Initiated: 06/28/2022 Target Resolution Date: 06/28/2022 Goal Status: Active Signs and symptoms for osteomyelitis will be recognized and promptly addressed Date Initiated: 06/28/2022 Date Inactivated: 08/03/2022 Target Resolution Date: 06/28/2022 Goal Status: Met Interventions: Assess for signs and symptoms of osteomyelitis resolution every visit Provide education on osteomyelitis Screen for HBO Notes: Wound/Skin Impairment Nursing Diagnoses: Knowledge deficit related to ulceration/compromised skin integrity Goals: Patient/caregiver will verbalize understanding of skin care regimen Date Initiated: 06/15/2022 Date Inactivated: 08/03/2022 Target Resolution Date: 07/16/2022 IZZAC, ROCKETT ([id number]) [id number] Page 6 of 9 Goal Status: Met Ulcer/skin breakdown will have a volume reduction of 30% by week 4 Date Initiated: 06/15/2022 Date Inactivated: 08/03/2022 Target Resolution Date: 07/16/2022 Goal Status: Unmet Unmet Reason: comorbities Ulcer/skin breakdown will have a volume reduction of 50% by week 8 Date Initiated: 06/15/2022 Date Inactivated: 08/31/2022 Target Resolution Date: 08/16/2022 Goal Status: Unmet Unmet Reason: comorbdities Ulcer/skin breakdown will have a volume reduction of 80% by week 12 Date Initiated: 06/15/2022 Target Resolution Date: 09/15/2022 Goal Status: Active Ulcer/skin breakdown will heal within 14 weeks Date Initiated: 06/15/2022 Target Resolution Date: 10/16/2022 Goal Status: Active Interventions: Assess patient/caregiver ability to obtain necessary  supplies Assess patient/caregiver ability to perform ulcer/skin care regimen upon admission and as needed Assess ulceration(s) every visit Notes: Electronic Signature(s) Signed: 11/02/2022 4:40:57 PM By: Carlene Coria RN Entered By: Carlene Coria on 11/02/2022 15:49:49 -------------------------------------------------------------------------------- Pain Assessment Details Patient Name: Date of Service: Matthew, [name] 11/02/2022 3:30 PM Medical Record Number: [id number] Patient Account Number: [id number] Date of Birth/Sex: Treating RN: 1966-10-21 (56 y.o. [name] Primary Care Annalia Metzger: PA Haig Prophet, NO Other Clinician: Referring Cataleyah Colborn: Treating Yoselyn Mcglade/Extender: Cloretta Ned in Treatment: 20 Active Problems Location of Pain Severity and Description of Pain Patient Has Paino No Site Locations Pain Management and Medication Current Pain Management: Matthew [name], Matthew [name] ([id number]) [id number] Page 7 of 9 Electronic Signature(s) Signed: 11/02/2022 4:40:57 PM By: Carlene Coria RN Entered By: Carlene Coria on 11/02/2022 15:42:51 -------------------------------------------------------------------------------- Patient/Caregiver Education Details Patient Name: Date of Service: Matthew TRAVIAN, [name] 11/30/2023andnbsp3:30 PM Medical Record Number: [id number] Patient Account Number: [id number] Date of Birth/Gender: Treating RN: Jul 12, 1966 (56 y.o. Matthew [name]) Carlene Coria Primary Care Physician: PA [place], [place]  Other Clinician: Referring Physician: Treating Physician/Extender: Cloretta Ned in Treatment: 20 Education Assessment Education Provided To: Patient Education Topics Provided Infection: Methods: Explain/Verbal Responses: State content correctly Electronic Signature(s) Signed: 11/02/2022 4:40:57 PM By: Carlene Coria RN Entered By: Carlene Coria on 11/02/2022  15:56:11 -------------------------------------------------------------------------------- Wound Assessment Details Patient Name: Date of Service: Matthew, [name] 11/02/2022 3:30 PM Medical Record Number: [id number] Patient Account Number: [id number] Date of Birth/Sex: Treating RN: [birth year] (56 y.o. Matthew [name]) Carlene Coria Primary Care Tashena Ibach: PA Haig Prophet, NO Other Clinician: Referring [place]: Treating Federico Maiorino/Extender: Cloretta Ned in Treatment: 20 Wound Status Wound Number: 1 Primary Etiology: Diabetic Wound/Ulcer of the Lower Extremity Wound Location: Left, Plantar Foot Wound Status: Open Wounding Event: Gradually Appeared Comorbid History: Type II Diabetes Date Acquired: 12/04/2021 Weeks Of Treatment: 20 Clustered Wound: No Pending Amputation On Presentation Matthew [name], Matthew [name] ([id number]) [id number] Page 8 of 9 Photos Wound Measurements Length: (cm) 9 Width: (cm) 6.5 Depth: (cm) 0.3 Area: (cm) 45.946 Volume: (cm) 13.784 % Reduction in Area: 54.3% % Reduction in Volume: 72.6% Epithelialization: None Tunneling: No Undermining: No Wound Description Classification: Grade 4 Exudate Amount: Medium Exudate Type: Serosanguineous Exudate Color: red, brown Foul Odor After Cleansing: No Slough/Fibrino Yes Wound Bed Granulation Amount: Medium (34-66%) Exposed Structure Granulation Quality: Pink, Hyper-granulation Fascia Exposed: No Necrotic Amount: Medium (34-66%) Fat Layer (Subcutaneous Tissue) Exposed: Yes Necrotic Quality: Adherent Slough Tendon Exposed: No Muscle Exposed: No Joint Exposed: No Bone Exposed: No Treatment Notes Wound #1 (Foot) Wound Laterality: Plantar, Left Cleanser [place] Ancillary Kit - 15 Day Supply Discharge Instruction: Use supplies as instructed; Kit contains: (15) Saline Bullets; (15) 3x3 Gauze; 15 pr Gloves Peri-Wound Care Topical Primary Dressing Hydrofera Blue Ready Transfer Foam, 4x5 (in/in) Discharge  Instruction: Apply Hydrofera Blue Ready to wound bed as directed Secondary Dressing ABD Pad 5x9 (in/in) Discharge Instruction: Cover with ABD pad Kerlix 4.5 x 4.1 (in/yd) Discharge Instruction: Apply Kerlix 4.5 x 4.1 (in/yd) as instructed Secured With Medipore T - 47M Medipore H Soft Cloth Surgical T ape ape, 2x2 (in/yd) Compression Wrap Compression Stockings Add-Ons Electronic Signature(s) Signed: 11/02/2022 4:40:57 PM By: Carlene Coria RN Entered By: Carlene Coria on 11/02/2022 15:49:29 [name], Matthew ([id number]) [id number] Page 9 of 9 -------------------------------------------------------------------------------- Vitals Details Patient Name: Date of Service: Matthew [name], Matthew [name] 11/02/2022 3:30 PM Medical Record Number: [id number] Patient Account Number: [id number] Date of Birth/Sex: Treating RN: [birth year] (56 y.o. Matthew [name]) Carlene Coria Primary Care Maxximus Gotay: PA Haig Prophet, [place] Other Clinician: Referring Roselinda Bahena: Treating Bertram Haddix/Extender: Cloretta Ned in Treatment: 20 Vital Signs Time Taken: 15:42 Temperature (F): 98.1 Height (in): 70 Pulse (bpm): 95 Weight (lbs): 230 Respiratory Rate (breaths/min): 18 Body Mass Index (BMI): 33 Blood Pressure (mmHg): 142/89 Reference Range: 80 - 120 mg / dl Electronic Signature(s) Signed: 11/02/2022 4:40:57 PM By: Carlene Coria RN Entered By: Carlene Coria on 11/02/2022 15:42:45

## 2022-11-03 NOTE — Telephone Encounter (Signed)
Called and spoke with patient. Relayed message verbatim per Dr. Rivka Safer, "other than blood sugar which is high , everything looks fine- the inflammatory markers are good as well ". Patient verbalized understanding and had no additional questions.  Wyvonne Lenz, RN

## 2022-11-03 NOTE — Progress Notes (Signed)
Matthew, [name] ([id number]) [id number] Page 1 of 8 Visit Report for 11/02/2022 Chief Complaint Document Details Patient Name: Date of Service: Matthew AYAAN, [name] 11/02/2022 3:30 PM Medical Record Number: [id number] Patient Account Number: [id number] Date of Birth/Sex: Treating RN: Aug 07, 1966 (56 y.o. 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 [name] Primary Care Provider: PA [name], NO Other Clinician: Referring Provider: Treating Provider/Extender: [name] in Treatment: 20 History of Present Illness HPI Description: Notes from [name] under [name] care:   ADMISSION6/15/2023This is a 56 year old poorly controlled type II diabetic (last A1c 11.5%). In January of this year, he presented to the hospital with an abscess in his left foot. He also had a foreign body present. It was removed in the ER and he was admitted to the hospital for IV antibiotics. He was subsequently discharged on oral antibiotics. Due to financial concerns, he has not taken his diabetic medications (metformin and 70/30 insulin) and as a result, presented back to the emergency room on June 1 with worsening findings, including frank pus and osteomyelitis on MRI. BKA was recommended, but he  declined. He was taken to the operating room by [name] who performed an extensive debridement. Cultures were taken and he was placed on IV antibiotics. He saw infectious disease while in the hospital who prescribed a 6-week course of IV antibiotics including cefazolin as well as oral metronidazole and levofloxacin. The infectious disease provider also indicated that he would benefit from below-knee amputation, but the patient desired another opinion and therefore he has been referred to wound care center for further evaluation and management. ABIs obtained while he was in the hospital demonstrate adequate blood flow. Essentially the entire bottom of the patient's left foot has been removed. The heads of the majority of his metatarsal bones are exposed along with their accompanying tendons. Muscle and fat are exposed as well. The wound surface is fairly dry; the patient reports that he was not [name] any specific wound care instructions other than to place a dry dressing on it after showering. There is no odor or purulent drainage identified. [name], [name] ([id number]) [id number] Page 2 of 8 05/26/2022: The wound measures a little bit smaller today. There is some good granulation tissue beginning to form on the surface. He still has exposed bone and tendon. There is some slough accumulation. He saw infectious disease earlier this week and was frustrated that they recommended amputation. He is interested in another surgical opinion. He continues to smoke but says that he has cut down. He is using a knee scooter for mobility so that he can stay off of his foot. 06/05/2022: He saw [name] last week who was supportive of our efforts to try to salvage the foot. I referred him to vascular surgery to see [name] but he has not received an appointment yet. He continues on IV antibiotics. He says that after our conversation last week about him being unable to initiate hyperbarics if  he was going to continue to  smoke, he has not had a cigarette since. He is now at 4 weeks of conventional management and his insurance was initiated on July 1. Today, the wound continues to show evidence of improvement. There is good granulation tissue forming. The metatarsals are still exposed but actually have some pink periosteum. There is still some slough and fibrinous exudate present. No odor or purulent drainage. We have just been using Dakin's dressing changes for now. Admission to [place] at [facility]: 06-15-2022 upon evaluation today patient presents for initial inspection here in our [name] though he has been seen by Dr. Celine [name] in [place] up to this point. I did review her notes and records as well they have been using Dakin's moistened gauze dressing which has done a great job at helping to clean out the bottom of his foot. Please see the discourse above for additional information in regard to treatment course up until the patient arrives here in the [place] [name] today. The patient is ready to get started with HBO therapy as soon as possible I do think based on all my review of his records this seems to be appropriate he does have chronic osteomyelitis is also been offered amputation this is obviously a limb salvage operation here. We are trying to prevent him from having to undergo an extensive amputation which would likely be a below-knee amputation which in turn is going to affect his quality of life he wants to try to avoid that if at all possible I think hyperbaric oxygen therapy is his best bet to achieve that goal. Currently he has been on IV Ancef. That actually ends tomorrow. Following that according to notes it appears he supposed to be taking Levaquin and Flagyl for an additional period of time although I am not certain exactly how long that with the. He has been seeing regional physicians infectious disease in [place] and again that so I recommended that he  contact today in order to find out what the plan is going forward. His most recent hemoglobin A1c as noted above was 11.5 on 05-07-2022 he tells me trying to work on that he is also quit smoking as of this point. 06-22-2022 upon evaluation today patient appears to be doing well currently in regard to his wound all things considered. He is showing signs of a lot of new granulation tissue and overall very pleased. He is changing this once a day with the Dakin's moistened gauze dressing. Fortunately I do not see any evidence of infection locally or systemically at this time. We have been trying to get him into the hyperbaric chamber he was having a lot of issues with sinus pressure and we have made referral and called multiple ENT specialist the earliest we have been able to get his August 9 at [facility] ear nose throat. They are going to try to work him in sooner if they have any opportunities to do so. Obviously I think the sooner he can do this the better. We need to get him in hyperbarics as quickly as possible. 7/26; patient presents for follow-up. He has been using Dakin's wet-to-dry dressings and oil emollient dressing over the exposed bone. He currently denies systemic signs of infection. He states he is scheduled to see infectious disease, Dr Candiss Norse next week. She had ordered an MRI of the left foot and he is scheduled to have this done tomorrow. He currently denies systemic signs of infection Including fever/chills, nausea/vomiting increased warmth or erythema to the wound bed or purulent  drainage. He started hyperbaric oxygen treatment however did not tolerate this due to sinus pressure. He is scheduled to see ENT in 2 weeks for evaluation. He is not continuing HBO until he is cleared by ENT. 07-06-2022 patient presents today for follow-up concerning his plantar foot ulcer. The good news is this actually is significantly better compared to previous. Some of the bone which is necrotic is starting to  slough off and I am going to perform debridement to clear this away today. Nonetheless I do think that the patient is going to need hyperbarics and we need to try to get it as soon as possible. He still having a lot of sinus pressure I can even hear the congestion today. He is on 3 different antibiotics cefadroxil, metronidazole, and Levaquin currently for the osteomyelitis. For that reason this should actually help with the infection if he does have a chronic sinusitis I am also going to see about getting him on steroids to try to see if this can benefit him as well. He voiced understanding and he is in agreement with giving that shot. This is probably can to spike his blood sugars which I discussed with him he is going to be seeing his primary care provider later today I want him to let her know as well what is going on and why so that she could be aware but I really do think he needs to take the prednisone. We need to get him in the chamber as soon as possible to try to help with getting this wound to heal and closed so that he does not lose his foot. 07-13-2022 upon evaluation today patient appears to be doing better in regard to his wound he continues to show signs of new granulation am going to perform some debridement today but overall this is doing quite well. 07-20-2022 upon evaluation today patient's wound is actually showing some signs of improvement he does have 1 area on the fifth metatarsal region where there is some necrotic tendon that is noted at this point. Unfortunately this is preventing good granulation and over this point the rest of the metatarsal region is completely covered over with granulation tissue which is great news there is some other slough and biofilm buildup that I will clearway as well. 07-27-2022 upon evaluation today patient appears to be doing well currently in regard to his plantar foot ulcer. He has been tolerating the dressing changes without complication  fortunately. I do believe he is making excellent progress and each time I see him this is better and better is pretty much completely covered as far as any bone exposure is concerned and he still is on the IV antibiotics were still also undergoing hyperbarics at this point that he just got started and is doing quite well with since getting the tubes in his ears.Marland Kitchen 08-03-2022 upon evaluation today patient appears to be doing well in general although has been having a lot of drainage compared to normal. It was actually leaking through his dressing today. He tells me he changed it last night. With that being said he has been up on his feet a lot more which I think is probably a big part of the issue here. Fortunately I do not see any signs of infection locally or systemically. 08-17-2022 upon evaluation today patient is doing well with regard to his wound this is actually measuring smaller which is good news. I am very pleased in that regard. With that being said he has not  been participating HBO therapy as he really needs to afford to see this heal appropriately and completely. In fact he has only made 9 total HBO treatments in the past 4 weeks. Again this is not therapeutic and I discussed that with the patient. Nonetheless he is still improving and since the hyperbarics is not helping based on the discussion today the patient is going to discontinue HBO therapy at this point. He tells me that getting here 5 days a week just is not possible. He has had difficulty getting here and then also when he was here had difficulty with getting here at the right time which has also been part of the problem. Nonetheless either way I am pleased that he is doing better and my hope is that we will still be able to get him healed obviously he should continue with the antibiotics as well as the wound care as he has been doing this seems to be doing excellent for him but everything is improving quite nicely. 08-24-2022 upon  evaluation today patient appears to be doing well currently in regard to his wound this is actually measuring significantly better even compared to last week. I am very pleased in that regard I do not see any signs of infection and I think he is making excellent progress here. 08-31-2022 upon evaluation today patient's wound is actually showing signs of significant improvement. I am actually very pleased with where things stand currently. There does not appear to be any evidence of active infection locally or systemically at this time. No fevers, chills, nausea, vomiting, or diarrhea. 09-19-2022 upon evaluation patient's wound is actually showing signs of improvement though it still having quite a bit of drainage. I do believe he would benefit from switching over to [facility]. I discussed that with him today. He is definitely in agreement with that plan. 09-28-2022 upon evaluation patient's wound is actually showing signs of improvement. With that being said he has been using the [facility] but he has not had enough to really cover the whole wound. In fact he tells me he just got his supplies today that we had ordered last week. Fortunately I do not see any evidence of active infection at this time. 10-05-2022 upon evaluation today patient appears to be doing well currently in regard to his foot wound there is actually much less debridement today to be done compared to previous. Fortunately I see no signs of active infection locally or systemically at this time. 11-02-2022 upon evaluation today patient appears to be doing well currently in regard to his wounds on the foot. Fortunately he seems to be making good progress is not quite as much of an improvement this week as it was last time I saw him but he had a lot going on and tells me that he has been up moving on [place], [place] ([id number]) [id number] Page 3 of 8 it more. I do believe he needs to try to get this under  control and I do believe staying off of it more will definitely help in that regard. Electronic Signature(s) Signed: 11/02/2022 4:57:42 PM By: Matthew Keeler Matthew-C Entered By: [name] on 11/02/2022 16:57:42 -------------------------------------------------------------------------------- Physical Exam Details Patient Name: Date of Service: [name], [name] 11/02/2022 3:30 PM Medical Record Number: [id number] Patient Account Number: [id number] Date of Birth/Sex: Treating RN: June 11, 1966 (56 y.o. [name] Primary Care Provider: PA [name], [place] Other Clinician: Referring Provider: Treating Provider/Extender: [name] in Treatment: 20 Constitutional  Well-nourished and well-hydrated in no acute distress. Respiratory normal breathing without difficulty. Psychiatric this patient is able to make decisions and demonstrates good insight into disease process. Alert and Oriented x 3. pleasant and cooperative. Notes Upon inspection patient's wound bed actually showed signs of good granulation epithelization at this point. Fortunately there does not appear to be any evidence of infection locally or systemically which is great news and overall I am extremely pleased with where things stand today. I did perform debridement clearway some of the necrotic debris. Electronic Signature(s) Signed: 11/02/2022 4:58:09 PM By: Matthew Keeler Matthew-C Entered By: [name] on 11/02/2022 16:58:09 -------------------------------------------------------------------------------- Physician Orders Details Patient Name: Date of Service: [name], [name] 11/02/2022 3:30 PM Medical Record Number: [id number] Patient Account Number: [id number] Date of Birth/Sex: Treating RN: Apr 16, 1966 (56 y.o. [name]) [name] Primary Care Provider: PA [name], NO Other Clinician: Referring Provider: Treating Provider/Extender: [name] in Treatment: 20 Verbal / Phone Orders:  No Diagnosis Coding Follow-up Appointments [name], [name] ([id number]) [id number] Page 4 of 8 Return Appointment in 1 week. Bathing/ Shower/ Hygiene May shower; gently cleanse wound with antibacterial soap, rinse and pat dry prior to dressing wounds Anesthetic (Use 'Patient Medications' Section for Anesthetic Order Entry) Lidocaine applied to wound bed Edema Control - Lymphedema / Segmental Compressive Device / Other Elevate, Exercise Daily and A void Standing for Long Periods of Time. Elevate legs to the level of the heart and pump ankles as often as possible Elevate leg(s) parallel to the floor when sitting. Wound Treatment Wound #1 - Foot Wound Laterality: Plantar, Left Cleanser: [place]ram Ancillary Kit - 15 Day Supply 1 x Per Day/30 Days Discharge Instructions: Use supplies as instructed; Kit contains: (15) Saline Bullets; (15) 3x3 Gauze; 15 pr Gloves Prim Dressing: Hydrofera Blue Ready Transfer Foam, 4x5 (in/in) (DME) (Generic) 1 x Per Day/30 Days ary Discharge Instructions: Apply Hydrofera Blue Ready to wound bed as directed Secondary Dressing: ABD Pad 5x9 (in/in) (Generic) 1 x Per Day/30 Days Discharge Instructions: Cover with ABD pad Secondary Dressing: Kerlix 4.5 x 4.1 (in/yd) (DME) (Generic) 1 x Per Day/30 Days Discharge Instructions: Apply Kerlix 4.5 x 4.1 (in/yd) as instructed Secured With: Medipore T - 53M Medipore H Soft Cloth Surgical T ape ape, 2x2 (in/yd) (DME) (Generic) 1 x Per Day/30 Days Electronic Signature(s) Signed: 11/02/2022 4:40:57 PM By: Matthew Coria RN Signed: 11/02/2022 5:46:39 PM By: Matthew Keeler Matthew-C Entered By: [name] on 11/02/2022 15:55:20 -------------------------------------------------------------------------------- Problem List Details Patient Name: Date of Service: [name], [name] 11/02/2022 3:30 PM Medical Record Number: [id number] Patient Account Number: [id number] Date of Birth/Sex: Treating RN: 11-22-1966 (56 y.o.  [name] Primary Care Provider: PA [name], NO Other Clinician: Referring Provider: Treating Provider/Extender: [name] in Treatment: 20 Active Problems ICD-10 Encounter Code Description Active Date MDM Diagnosis [phone] Other chronic osteomyelitis, left ankle and foot 06/15/2022 No Yes E11.621 Type 2 diabetes mellitus with foot ulcer 06/15/2022 No Yes L97.524 Non-pressure chronic ulcer of other part of left foot with necrosis of bone 06/15/2022 No Yes WISWELL, Grason ([id number]) [id number] Page 5 of 8 Inactive Problems Resolved Problems Electronic Signature(s) Signed: 11/02/2022 4:03:55 PM By: Matthew Keeler Matthew-C Entered By: [name] on 11/02/2022 16:03:55 -------------------------------------------------------------------------------- Progress Note Details Patient Name: Date of Service: [name], [name] 11/02/2022 3:30 PM Medical Record Number: [id number] Patient Account Number: [id number] Date of Birth/Sex: Treating RN: August 11, 1966 (56 y.o. 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 In January of this year, he presented to the hospital with an abscess in his left foot. He also had a foreign body present. It was removed in the ER and he was admitted to the hospital for IV antibiotics. He was subsequently discharged on oral antibiotics. Due to financial concerns, he has not taken his diabetic medications (metformin and 70/30 insulin) and as a result, presented back to  the emergency room on June 1 with worsening findings, including frank pus and osteomyelitis on MRI. BKA was recommended, but he declined. He was taken to the operating room by [name] who performed an extensive debridement. Cultures were taken and he was placed on IV antibiotics. He saw infectious disease while in the hospital who prescribed a 6-week course of IV antibiotics including cefazolin as well as oral metronidazole and levofloxacin. The infectious disease provider also indicated that he would benefit from below-knee amputation, but the patient desired another opinion and therefore he has been referred to wound care center for further evaluation and management. ABIs obtained while he was in the hospital demonstrate adequate blood flow. Essentially the entire bottom of the patient's left foot has been removed. The heads of the majority of his metatarsal bones are exposed along with their accompanying tendons. Muscle and fat are exposed as well. The wound surface is fairly dry; the patient reports that he was not [name] any specific wound care instructions other than to place a dry dressing on it after showering. There is no odor or purulent drainage identified. 05/26/2022: The wound measures a little bit smaller today. There is some good granulation tissue beginning to form on the surface. He still has exposed bone and tendon. There is some slough accumulation. He saw infectious disease earlier this week and was frustrated that they recommended amputation. He is interested in another surgical opinion. He continues to smoke but says that he has cut down. He is using a knee scooter for mobility so that he can stay off of his foot. 06/05/2022: He saw [name] last week who was supportive of our efforts to try to salvage the foot. I referred him to vascular surgery to see [name] but he has not received an appointment yet. He continues on IV antibiotics. He says that after our conversation last week  about him being unable to initiate hyperbarics if he was going to continue to smoke, he has not had a cigarette since. He is now at 4 weeks of conventional management and his insurance was initiated on July 1. Today, the wound continues to show evidence of improvement. There is good granulation tissue forming. The metatarsals are still exposed but actually have some pink periosteum. There is still some slough and fibrinous exudate present. No odor or purulent drainage. We have just been using Dakin's dressing changes for now. Admission to [place] at [facility]: 06-15-2022 upon evaluation today patient presents for initial inspection here in our [name] though he has been seen by Dr. Celine [name] in [place] up to this point. I did review her notes and records as well they have been using Dakin's moistened gauze dressing which has done a great job at helping  to clean out the bottom of his foot. Please see the discourse above for additional information in regard to treatment course up until the patient arrives here in the [place] [name] today. The patient is ready to get started with HBO therapy as soon as possible I do think based on all my review of his records this seems to be appropriate he does have chronic osteomyelitis is also been offered amputation this is obviously a limb salvage operation here. We are trying to prevent him from having to undergo an extensive amputation which would likely be a below-knee amputation which in turn is going to affect his quality of life he wants to try to avoid that if at all possible I think hyperbaric oxygen therapy is his best bet to achieve that goal. Currently he has been on IV Ancef. That actually ends tomorrow. Following that according to notes it appears he supposed to be taking Levaquin and Flagyl for an additional period of time although I am not certain exactly how long that with the. He has been seeing regional physicians infectious disease in  [place] and again that so I recommended that he contact today in order to find out what the plan is going forward. His most recent hemoglobin A1c as noted above was 11.5 on 05-07-2022 he tells me trying to work on that he is also quit smoking as of this point. 06-22-2022 upon evaluation today patient appears to be doing well currently in regard to his wound all things considered. He is showing signs of a lot of new VONBEHREN, [place] ([id number]) [id number] Page 6 of 8 granulation tissue and overall very pleased. He is changing this once a day with the Dakin's moistened gauze dressing. Fortunately I do not see any evidence of infection locally or systemically at this time. We have been trying to get him into the hyperbaric chamber he was having a lot of issues with sinus pressure and we have made referral and called multiple ENT specialist the earliest we have been able to get his August 9 at [facility] ear nose throat. They are going to try to work him in sooner if they have any opportunities to do so. Obviously I think the sooner he can do this the better. We need to get him in hyperbarics as quickly as possible. 7/26; patient presents for follow-up. He has been using Dakin's wet-to-dry dressings and oil emollient dressing over the exposed bone. He currently denies systemic signs of infection. He states he is scheduled to see infectious disease, Dr Candiss Norse next week. She had ordered an MRI of the left foot and he is scheduled to have this done tomorrow. He currently denies systemic signs of infection Including fever/chills, nausea/vomiting increased warmth or erythema to the wound bed or purulent drainage. He started hyperbaric oxygen treatment however did not tolerate this due to sinus pressure. He is scheduled to see ENT in 2 weeks for evaluation. He is not continuing HBO until he is cleared by ENT. 07-06-2022 patient presents today for follow-up concerning his plantar foot ulcer.  The good news is this actually is significantly better compared to previous. Some of the bone which is necrotic is starting to slough off and I am going to perform debridement to clear this away today. Nonetheless I do think that the patient is going to need hyperbarics and we need to try to get it as soon as possible. He still having a lot of sinus pressure I can even hear the congestion today. He is  on 3 different antibiotics cefadroxil, metronidazole, and Levaquin currently for the osteomyelitis. For that reason this should actually help with the infection if he does have a chronic sinusitis I am also going to see about getting him on steroids to try to see if this can benefit him as well. He voiced understanding and he is in agreement with giving that shot. This is probably can to spike his blood sugars which I discussed with him he is going to be seeing his primary care provider later today I want him to let her know as well what is going on and why so that she could be aware but I really do think he needs to take the prednisone. We need to get him in the chamber as soon as possible to try to help with getting this wound to heal and closed so that he does not lose his foot. 07-13-2022 upon evaluation today patient appears to be doing better in regard to his wound he continues to show signs of new granulation am going to perform some debridement today but overall this is doing quite well. 07-20-2022 upon evaluation today patient's wound is actually showing some signs of improvement he does have 1 area on the fifth metatarsal region where there is some necrotic tendon that is noted at this point. Unfortunately this is preventing good granulation and over this point the rest of the metatarsal region is completely covered over with granulation tissue which is great news there is some other slough and biofilm buildup that I will clearway as well. 07-27-2022 upon evaluation today patient appears to be  doing well currently in regard to his plantar foot ulcer. He has been tolerating the dressing changes without complication fortunately. I do believe he is making excellent progress and each time I see him this is better and better is pretty much completely covered as far as any bone exposure is concerned and he still is on the IV antibiotics were still also undergoing hyperbarics at this point that he just got started and is doing quite well with since getting the tubes in his ears.Marland Kitchen 08-03-2022 upon evaluation today patient appears to be doing well in general although has been having a lot of drainage compared to normal. It was actually leaking through his dressing today. He tells me he changed it last night. With that being said he has been up on his feet a lot more which I think is probably a big part of the issue here. Fortunately I do not see any signs of infection locally or systemically. 08-17-2022 upon evaluation today patient is doing well with regard to his wound this is actually measuring smaller which is good news. I am very pleased in that regard. With that being said he has not been participating HBO therapy as he really needs to afford to see this heal appropriately and completely. In fact he has only made 9 total HBO treatments in the past 4 weeks. Again this is not therapeutic and I discussed that with the patient. Nonetheless he is still improving and since the hyperbarics is not helping based on the discussion today the patient is going to discontinue HBO therapy at this point. He tells me that getting here 5 days a week just is not possible. He has had difficulty getting here and then also when he was here had difficulty with getting here at the right time which has also been part of the problem. Nonetheless either way I am pleased that he is doing  better and my hope is that we will still be able to get him healed obviously he should continue with the antibiotics as well as the wound  care as he has been doing this seems to be doing excellent for him but everything is improving quite nicely. 08-24-2022 upon evaluation today patient appears to be doing well currently in regard to his wound this is actually measuring significantly better even compared to last week. I am very pleased in that regard I do not see any signs of infection and I think he is making excellent progress here. 08-31-2022 upon evaluation today patient's wound is actually showing signs of significant improvement. I am actually very pleased with where things stand currently. There does not appear to be any evidence of active infection locally or systemically at this time. No fevers, chills, nausea, vomiting, or diarrhea. 09-19-2022 upon evaluation patient's wound is actually showing signs of improvement though it still having quite a bit of drainage. I do believe he would benefit from switching over to [facility]. I discussed that with him today. He is definitely in agreement with that plan. 09-28-2022 upon evaluation patient's wound is actually showing signs of improvement. With that being said he has been using the [facility] but he has not had enough to really cover the whole wound. In fact he tells me he just got his supplies today that we had ordered last week. Fortunately I do not see any evidence of active infection at this time. 10-05-2022 upon evaluation today patient appears to be doing well currently in regard to his foot wound there is actually much less debridement today to be done compared to previous. Fortunately I see no signs of active infection locally or systemically at this time. 11-02-2022 upon evaluation today patient appears to be doing well currently in regard to his wounds on the foot. Fortunately he seems to be making good progress is not quite as much of an improvement this week as it was last time I saw him but he had a lot going on and tells me that he has been up moving on it  more. I do believe he needs to try to get this under control and I do believe staying off of it more will definitely help in that regard. Objective Constitutional Well-nourished and well-hydrated in no acute distress. Vitals Time Taken: 3:42 PM, Height: 70 in, Weight: 230 lbs, BMI: 33, Temperature: 98.1 F, Pulse: 95 bpm, Respiratory Rate: 18 breaths/min, Blood Pressure: 142/89 mmHg. Respiratory normal breathing without difficulty. Psychiatric this patient is able to make decisions and demonstrates good insight into disease process. Alert and Oriented x 3. pleasant and cooperative. [name], [name] ([id number]) [id number] Page 7 of 8 General Notes: Upon inspection patient's wound bed actually showed signs of good granulation epithelization at this point. Fortunately there does not appear to be any evidence of infection locally or systemically which is great news and overall I am extremely pleased with where things stand today. I did perform debridement clearway some of the necrotic debris. Integumentary (Hair, Skin) Wound #1 status is Open. Original cause of wound was Gradually Appeared. The date acquired was: 12/04/2021. The wound has been in treatment 20 weeks. The wound is located on the [place]. The wound measures 9cm length x 6.5cm width x 0.3cm depth; 45.946cm^2 area and 13.784cm^3 volume. There is Fat Layer (Subcutaneous Tissue) exposed. There is no tunneling or undermining noted. There is a medium amount of serosanguineous drainage noted. There is medium (34-66%) pink,  hyper - granulation within the wound bed. There is a medium (34-66%) amount of necrotic tissue within the wound bed including Adherent Slough. Assessment Active Problems ICD-10 Other chronic osteomyelitis, left ankle and foot Type 2 diabetes mellitus with foot ulcer Non-pressure chronic ulcer of other part of left foot with necrosis of bone Plan Follow-up Appointments: Return  Appointment in 1 week. Bathing/ Shower/ Hygiene: May shower; gently cleanse wound with antibacterial soap, rinse and pat dry prior to dressing wounds Anesthetic (Use 'Patient Medications' Section for Anesthetic Order Entry): Lidocaine applied to wound bed Edema Control - Lymphedema / Segmental Compressive Device / Other: Elevate, Exercise Daily and Avoid Standing for Long Periods of Time. Elevate legs to the level of the heart and pump ankles as often as possible Elevate leg(s) parallel to the floor when sitting. WOUND #1: - Foot Wound Laterality: Plantar, Left Cleanser: [place]ram Ancillary Kit - 15 Day Supply 1 x Per Day/30 Days Discharge Instructions: Use supplies as instructed; Kit contains: (15) Saline Bullets; (15) 3x3 Gauze; 15 pr Gloves Prim Dressing: Hydrofera Blue Ready Transfer Foam, 4x5 (in/in) (DME) (Generic) 1 x Per Day/30 Days ary Discharge Instructions: Apply Hydrofera Blue Ready to wound bed as directed Secondary Dressing: ABD Pad 5x9 (in/in) (Generic) 1 x Per Day/30 Days Discharge Instructions: Cover with ABD pad Secondary Dressing: Kerlix 4.5 x 4.1 (in/yd) (DME) (Generic) 1 x Per Day/30 Days Discharge Instructions: Apply Kerlix 4.5 x 4.1 (in/yd) as instructed Secured With: Medipore T - 25M Medipore H Soft Cloth Surgical T ape ape, 2x2 (in/yd) (DME) (Generic) 1 x Per Day/30 Days 1. I am going to recommend that we have the patient continue to utilize the [facility] which I think is doing a good job here. 2. I am also going to recommend that we have the patient continue with ABD pad to cover followed by roll gauze to secure in place. 3. I would like to look into the Apligraf as a possibility here as well. Will get a go and see about getting that checked into for him. We will see patient back for reevaluation in 1 week here in the [name]. If anything worsens or changes patient will contact our office for additional recommendations. Electronic Signature(s) Signed: 11/02/2022  5:01:30 PM By: Matthew Keeler Matthew-C Entered By: [name] on 11/02/2022 17:01:30 -------------------------------------------------------------------------------- SuperBill Details Patient Name: Date of Service: [name], [name] 11/02/2022 Medical Record Number: [id number] Patient Account Number: [id number] CYNCERE, SONTAG ([id number]) [id number] Page 8 of 8 Date of Birth/Sex: Treating RN: [birth year] (56 y.o. [name]) [name] Primary Care Provider: PA [name], NO Other Clinician: Referring Provider: Treating Provider/Extender: [name] in Treatment: 20 Diagnosis Coding ICD-10 Codes Code Description [phone] Other chronic osteomyelitis, left ankle and foot E11.621 Type 2 diabetes mellitus with foot ulcer L97.524 Non-pressure chronic ulcer of other part of left foot with necrosis of bone Facility Procedures : CPT4 Code: [id number] Description: [phone] - WOUND CARE VISIT-LEV 2 EST PT Modifier: Quantity: 1 Physician Procedures : CPT4 Code Description Modifier [id number] - WC PHYS LEVEL 3 - EST PT ICD-10 Diagnosis Description M86.672 Other chronic osteomyelitis, left ankle and foot E11.621 Type 2 diabetes mellitus with foot ulcer L97.524 Non-pressure chronic ulcer of other  part of left foot with necrosis of bone Quantity: 1 Electronic Signature(s) Signed: 11/02/2022 5:01:52 PM By: Matthew Keeler Matthew-C Previous Signature: 11/02/2022 4:40:57 PM Version By: Matthew Coria RN Entered By: [name] on 11/02/2022 17:01:52

## 2022-11-06 ENCOUNTER — Encounter: Payer: Self-pay | Admitting: Gastroenterology

## 2022-11-06 DIAGNOSIS — E11621 Type 2 diabetes mellitus with foot ulcer: Secondary | ICD-10-CM | POA: Diagnosis not present

## 2022-11-09 ENCOUNTER — Encounter: Payer: 59 | Attending: Physician Assistant | Admitting: Physician Assistant

## 2022-11-09 DIAGNOSIS — M86672 Other chronic osteomyelitis, left ankle and foot: Secondary | ICD-10-CM | POA: Diagnosis not present

## 2022-11-09 DIAGNOSIS — E1169 Type 2 diabetes mellitus with other specified complication: Secondary | ICD-10-CM | POA: Insufficient documentation

## 2022-11-09 DIAGNOSIS — E11621 Type 2 diabetes mellitus with foot ulcer: Secondary | ICD-10-CM | POA: Insufficient documentation

## 2022-11-09 DIAGNOSIS — L97524 Non-pressure chronic ulcer of other part of left foot with necrosis of bone: Secondary | ICD-10-CM | POA: Diagnosis not present

## 2022-11-09 DIAGNOSIS — L97522 Non-pressure chronic ulcer of other part of left foot with fat layer exposed: Secondary | ICD-10-CM | POA: Diagnosis not present

## 2022-11-10 ENCOUNTER — Telehealth: Payer: Self-pay | Admitting: General Practice

## 2022-11-10 NOTE — Progress Notes (Signed)
KIJUAN, GALLICCHIO ([id number]) [id number] Page 1 of 9 Visit Report for 11/09/2022 Arrival Information Details Patient Name: Date of Service: [name] SHIVAAY, STORMONT 11/09/2022 2:00 PM Medical Record Number: [id number] Patient Account Number: [id number] Date of Birth/Sex: Treating RN: Jul 14, 1966 (56 y.o. [name]) Carlene Coria Primary Care Sorren Vallier: PA Haig Prophet, NO Other Clinician: Referring Arliene Rosenow: Treating Calistro Rauf/Extender: Cloretta Ned in Treatment: 21 Visit Information History Since Last Visit Added or deleted any medications: No Patient Arrived: Ambulatory Any new allergies or adverse reactions: No Arrival Time: 14:07 Had a fall or experienced change in No Accompanied By: self activities of daily living that may affect Transfer Assistance: None risk of falls: Patient Identification Verified: Yes Signs or symptoms of abuse/neglect since last visito No Secondary Verification Process Completed: Yes Hospitalized since last visit: No Patient Requires Transmission-Based Precautions: No Implantable device outside of the clinic excluding No Patient Has Alerts: No cellular tissue based products placed in the center since last visit: Has Dressing in Place as Prescribed: Yes Pain Present Now: No Electronic Signature(s) Unsigned Entered ByCarlene Coria on 11/09/2022 14:11:37 -------------------------------------------------------------------------------- Clinic Level of Care Assessment Details Patient Name: Date of Service: MATE, ALEGRIA 11/09/2022 2:00 PM Medical Record Number: [id number] Patient Account Number: [id number] Date of Birth/Sex: Treating RN: [birth year] (56 y.o. [name]) Carlene Coria Primary Care Joby Hershkowitz: PA Haig Prophet, NO Other Clinician: Referring Amada Hallisey: Treating Wendell Fiebig/Extender: Cloretta Ned in Treatment: 21 Clinic Level of Care Assessment Items TOOL 4 Quantity Score X- 1 0 Use when only an EandM is performed on FOLLOW-UP  visit ASSESSMENTS - Nursing Assessment / Reassessment X- 1 10 Reassessment of Co-morbidities (includes updates in patient status) BUTCHER, [place] ([id number]) [id number] Page 2 of 9 X- 1 5 Reassessment of Adherence to Treatment Plan ASSESSMENTS - Wound and Skin A ssessment / Reassessment X - Simple Wound Assessment / Reassessment - one wound 1 5 _0  - 0 Complex Wound Assessment / Reassessment - multiple wounds _1  - 0 Dermatologic / Skin Assessment (not related to wound area) ASSESSMENTS - Focused Assessment _2  - 0 Circumferential Edema Measurements - multi extremities _3  - 0 Nutritional Assessment / Counseling / Intervention _4  - 0 Lower Extremity Assessment (monofilament, tuning fork, pulses) _5  - 0 Peripheral Arterial Disease Assessment (using hand held doppler) ASSESSMENTS - Ostomy and/or Continence Assessment and Care _6  - 0 Incontinence Assessment and Management _7  - 0 Ostomy Care Assessment and Management (repouching, etc.) PROCESS - Coordination of Care X - Simple Patient / Family Education for ongoing care 1 15 _8  - 0 Complex (extensive) Patient / Family Education for ongoing care _9  - 0 Staff obtains Programmer, systems, Records, T Results / Process Orders est _10  - 0 Staff telephones HHA, Nursing Homes / Clarify orders / etc _11  - 0 Routine Transfer to another Facility (non-emergent condition) _12  - 0 Routine Hospital Admission (non-emergent condition) _13  - 0 New Admissions / Biomedical engineer / Ordering NPWT Apligraf, etc. , _14  - 0 Emergency Hospital Admission (emergent condition) X- 1 10 Simple Discharge Coordination _15  - 0 Complex (extensive) Discharge Coordination PROCESS - Special Needs _16  - 0 Pediatric / Minor Patient Management _17  - 0 Isolation Patient Management _18  - 0 Hearing / Language / Visual special needs _19  - 0 Assessment of Community assistance (transportation, D/C planning, etc.) _20  - 0 Additional assistance / Altered  mentation _21  - 0 Support Surface(s) Assessment (bed, cushion, seat, etc.) INTERVENTIONS - Wound Cleansing / Measurement X - Simple Wound Cleansing - one wound 1 5 _22  - 0 Complex Wound  Cleansing - multiple wounds X- 1 5 Wound Imaging (photographs - any number of wounds) _0  - 0 Wound Tracing (instead of photographs) X- 1 5 Simple Wound Measurement - one wound _1  - 0 Complex Wound Measurement - multiple wounds INTERVENTIONS - Wound Dressings X - Small Wound Dressing one or multiple wounds 1 10 _2  - 0 Medium Wound Dressing one or multiple wounds _3  - 0 Large Wound Dressing one or multiple wounds <YSAYTKZSWFUXNATF>_5<\/DDUKGURKYHCWCBJS>_2  - 0 Application of Medications - topical <GBTDVVOHYWVPXTGG>_2<\/IRSWNIOEVOJJKKXF>_8  - 0 Application of Medications - injection INTERVENTIONS - Harahan, Dontae ([id number]) [id number] Page 3 of 9 _6  - 0 External ear exam _7  - 0 Specimen Collection (cultures, biopsies, blood, body fluids, etc.) _8  - 0 Specimen(s) / Culture(s) sent or taken to Lab for analysis _9  - 0 Patient Transfer (multiple staff / Harrel Lemon Lift / Similar devices) _10  - 0 Simple Staple / Suture removal (25 or less) _11  - 0 Complex Staple / Suture removal (26 or more) _12  - 0 Hypo / Hyperglycemic Management (close monitor of Blood Glucose) _13  - 0 Ankle / Brachial Index (ABI) - do not check if billed separately X- 1 5 Vital Signs Has the patient been seen at the hospital within the last three years: Yes Total Score: 75 Level Of Care: New/Established - Level 2 Electronic Signature(s) Unsigned Entered ByCarlene Coria on 11/09/2022 14:33:22 -------------------------------------------------------------------------------- Encounter Discharge Information Details Patient Name: Date of Service: [place] RIKI, GEHRING 11/09/2022 2:00 PM Medical Record Number: [id number] Patient Account Number: [id number] Date of Birth/Sex: Treating RN: [birth year] (56 y.o. [name]) Carlene Coria Primary Care Ceili Boshers: PA Haig Prophet, NO Other Clinician: Referring  Miko Sirico: Treating Jerrye Seebeck/Extender: Cloretta Ned in Treatment: 21 Encounter Discharge Information Items Discharge Condition: Stable Ambulatory Status: Knee Scooter Discharge Destination: Home Transportation: Private Auto Accompanied By: self Schedule Follow-up Appointment: Yes Clinical Summary of Care: Electronic Signature(s) Unsigned Entered ByCarlene Coria on 11/09/2022 14:34:41 Signature(s): [name] ([id number]) [phone] Date(s): [id number] Page 4 of 9 -------------------------------------------------------------------------------- Lower Extremity Assessment Details Patient Name: Date of Service: RAMIREZ, FULLBRIGHT 11/09/2022 2:00 PM Medical Record Number: [id number] Patient Account Number: [id number] Date of Birth/Sex: Treating RN: 06-03-1966 (56 y.o. [name]) Carlene Coria Primary Care Capri Veals: PA Haig Prophet, NO Other Clinician: Referring Clorine Swing: Treating Kendarius Vigen/Extender: Cloretta Ned in Treatment: 21 Vascular Assessment Pulses: Dorsalis Pedis Palpable: [Left:Yes] Electronic Signature(s) Signed: 11/09/2022 2:28:51 PM By: Carlene Coria RN Entered By: Carlene Coria on 11/09/2022 14:28:50 -------------------------------------------------------------------------------- Multi Wound Chart Details Patient Name: Date of Service: [place] BRAHIM, DOLMAN 11/09/2022 2:00 PM Medical Record Number: [id number] Patient Account Number: [id number] Date of Birth/Sex: Treating RN: January 16, 1966 (56 y.o. [name]) Carlene Coria Primary Care Reilly Molchan: PA Haig Prophet, NO Other Clinician: Referring Iysis Germain: Treating Branko Steeves/Extender: Cloretta Ned in Treatment: 21 Vital Signs Height(in): 70 Pulse(bpm): 97 Weight(lbs): 230 Blood Pressure(mmHg): 160/100 Body Mass Index(BMI): 33 Temperature(F): 9+7.9 Respiratory Rate(breaths/min): 18 [1:Photos: No Photos Left, Plantar Foot Wound Location: Gradually Appeared Wounding Event: Diabetic  Wound/Ulcer of the Lower Primary Etiology: Extremity Type II Diabetes Comorbid History: 12/04/2021 Date Acquired: 21 Weeks of Treatment: Open Wound Status: No Wound  Recurrence: Yes Pending A mputation on Presentation:] [N/A:N/A N/A N/A N/A N/A N/A N/A N/A N/A N/A] BAPTISTE, LITTLER ([id number]) [1:9x6.5x0.3 Measurements L x W x D (cm) 45.946 A (cm) : rea 13.784 Volume (cm) : 54.30% % Reduction in A rea: 72.60% % Reduction in Volume: Grade 4 Classification: Medium Exudate A mount: Serosanguineous Exudate Type: red, brown Exudate Color: Medium  (34-66%) Granulation A mount:  Pink, Hyper-granulation Granulation Quality: Medium (34-66%) Necrotic A mount: Fat Layer (Subcutaneous Tissue): Yes N/A Exposed Structures: Fascia: No Tendon: No Muscle: No Joint: No Bone: No None Epithelialization:]  [N/A:N/A N/A N/A N/A N/A N/A N/A N/A N/A N/A N/A N/A N/A] Treatment Notes Electronic Signature(s) Signed: 11/09/2022 2:28:56 PM By: Carlene Coria RN Entered By: Carlene Coria on 11/09/2022 14:28:56 -------------------------------------------------------------------------------- Multi-Disciplinary Care Plan Details Patient Name: Date of Service: [place] PHARRELL, LEDFORD 11/09/2022 2:00 PM Medical Record Number: [id number] Patient Account Number: [id number] Date of Birth/Sex: Treating RN: 30-Oct-1966 (56 y.o. [name]) Carlene Coria Primary Care Marcellis Frampton: PA Haig Prophet, NO Other Clinician: Referring Keirsten Matuska: Treating Yonael Tulloch/Extender: Cloretta Ned in Treatment: 21 Active Inactive Osteomyelitis Nursing Diagnoses: Infection: osteomyelitis Knowledge deficit related to disease process and management Potential for infection: osteomyelitis Goals: Diagnostic evaluation for osteomyelitis completed as ordered Date Initiated: 06/28/2022 Date Inactivated: 08/03/2022 Target Resolution Date: 06/28/2022 Goal Status: Met Patient/caregiver will verbalize understanding of disease process and disease management Date Initiated:  06/28/2022 Date Inactivated: 08/31/2022 Target Resolution Date: 06/28/2022 Goal Status: Met Patient's osteomyelitis will resolve Date Initiated: 06/28/2022 Target Resolution Date: 06/28/2022 Goal Status: Active Signs and symptoms for osteomyelitis will be recognized and promptly addressed Date Initiated: 06/28/2022 Date Inactivated: 08/03/2022 Target Resolution Date: 06/28/2022 Goal Status: Met Interventions: Assess for signs and symptoms of osteomyelitis resolution every visit Provide education on osteomyelitis SANJUAN, SAWA ([id number]) [id number] Page 6 of 9 Screen for HBO Notes: Wound/Skin Impairment Nursing Diagnoses: Knowledge deficit related to ulceration/compromised skin integrity Goals: Patient/caregiver will verbalize understanding of skin care regimen Date Initiated: 06/15/2022 Date Inactivated: 08/03/2022 Target Resolution Date: 07/16/2022 Goal Status: Met Ulcer/skin breakdown will have a volume reduction of 30% by week 4 Date Initiated: 06/15/2022 Date Inactivated: 08/03/2022 Target Resolution Date: 07/16/2022 Goal Status: Unmet Unmet Reason: comorbities Ulcer/skin breakdown will have a volume reduction of 50% by week 8 Date Initiated: 06/15/2022 Date Inactivated: 08/31/2022 Target Resolution Date: 08/16/2022 Goal Status: Unmet Unmet Reason: comorbdities Ulcer/skin breakdown will have a volume reduction of 80% by week 12 Date Initiated: 06/15/2022 Target Resolution Date: 09/15/2022 Goal Status: Active Ulcer/skin breakdown will heal within 14 weeks Date Initiated: 06/15/2022 Target Resolution Date: 10/16/2022 Goal Status: Active Interventions: Assess patient/caregiver ability to obtain necessary supplies Assess patient/caregiver ability to perform ulcer/skin care regimen upon admission and as needed Assess ulceration(s) every visit Notes: Electronic Signature(s) Unsigned Entered By: Carlene Coria on 11/09/2022  14:33:53 -------------------------------------------------------------------------------- Pain Assessment Details Patient Name: Date of Service: RASEAN, JOOS 11/09/2022 2:00 PM Medical Record Number: [id number] Patient Account Number: [id number] Date of Birth/Sex: Treating RN: 16-Sep-1966 (56 y.o. [name] Primary Care Yeslin Delio: PA Haig Prophet, NO Other Clinician: Referring Wynelle Dreier: Treating Kemar Pandit/Extender: Cloretta Ned in Treatment: 21 Active Problems Location of Pain Severity and Description of Pain Patient Has Paino No Site Locations [place], [place] ([id number]) [phone].pdf Page 7 of 9 Pain Management and Medication Current Pain Management: Electronic Signature(s) Unsigned Entered ByCarlene Coria on 11/09/2022 14:12:08 -------------------------------------------------------------------------------- Patient/Caregiver Education Details Patient Name: Date of Service: [place] SAVVA, BEAMER 12/7/2023andnbsp2:00 PM Medical Record Number: [id number] Patient Account Number: [id number] Date of Birth/Gender: Treating RN: 04/18/1966 (56 y.o. [name] Primary Care Physician: PA Haig Prophet, [place] Other Clinician: Referring Physician: Treating Physician/Extender: Cloretta Ned in Treatment: 21 Education Assessment Education Provided To: Patient Education Topics Provided Infection: Methods: Explain/Verbal Responses: State content correctly Electronic Signature(s) Unsigned Entered ByCarlene Coria on 11/09/2022 14:33:42 Signature(s): [name] ([id number]) Date(s): [id number] Page 8 of 9 -------------------------------------------------------------------------------- Wound Assessment Details Patient  Name: Date of Service: [name] ALBA, KRIESEL 11/09/2022 2:00 PM Medical Record Number: [id number] Patient Account Number: [id number] Date of Birth/Sex: Treating RN: [birth year] (56 y.o. [name]) Carlene Coria Primary  Care Alizia Greif: PA Haig Prophet, [place] Other Clinician: Referring Levone Otten: Treating Zoltan Genest/Extender: Cloretta Ned in Treatment: 21 Wound Status Wound Number: 1 Primary Etiology: Diabetic Wound/Ulcer of the Lower Extremity Wound Location: Left, Plantar Foot Wound Status: Open Wounding Event: Gradually Appeared Comorbid History: Type II Diabetes Date Acquired: 12/04/2021 Weeks Of Treatment: 21 Clustered Wound: No Pending Amputation On Presentation Photos Wound Measurements Length: (cm) 9 Width: (cm) 6.5 Depth: (cm) 0.3 Area: (cm) 45.946 Volume: (cm) 13.784 % Reduction in Area: 54.3% % Reduction in Volume: 72.6% Epithelialization: None Tunneling: No Undermining: No Wound Description Classification: Grade 4 Exudate Amount: Medium Exudate Type: Serosanguineous Exudate Color: red, brown Foul Odor After Cleansing: No Slough/Fibrino Yes Wound Bed Granulation Amount: Medium (34-66%) Exposed Structure Granulation Quality: Pink, Hyper-granulation Fascia Exposed: No Necrotic Amount: Medium (34-66%) Fat Layer (Subcutaneous Tissue) Exposed: Yes Necrotic Quality: Adherent Slough Tendon Exposed: No Muscle Exposed: No Joint Exposed: No Bone Exposed: No Treatment Notes Wound #1 (Foot) Wound Laterality: Plantar, Left Cleanser [place] Ancillary Kit - 15 Day Supply Discharge Instruction: Use supplies as instructed; Kit contains: (15) Saline Bullets; (15) 3x3 Gauze; 15 pr [place], [place] ([id number]) [phone].pdf Page 9 of 9 Peri-Wound Care Topical Primary Dressing Hydrofera Blue Ready Transfer Foam, 4x5 (in/in) Discharge Instruction: Apply Hydrofera Blue Ready to wound bed as directed Secondary Dressing ABD Pad 5x9 (in/in) Discharge Instruction: Cover with ABD pad Kerlix 4.5 x 4.1 (in/yd) Discharge Instruction: Apply Kerlix 4.5 x 4.1 (in/yd) as instructed Secured With [place] H Soft Cloth Surgical T ape ape, 2x2  (in/yd) Compression Wrap Compression Stockings Add-Ons Electronic Signature(s) Unsigned Entered ByCarlene Coria on 11/09/2022 14:18:34 -------------------------------------------------------------------------------- Vitals Details Patient Name: Date of Service: [place] CLEOPHUS, MENDONSA 11/09/2022 2:00 PM Medical Record Number: [id number] Patient Account Number: [id number] Date of Birth/Sex: Treating RN: 21-May-1966 (56 y.o. [name]) Carlene Coria Primary Care Leatha Rohner: PA Haig Prophet, NO Other Clinician: Referring Bathsheba Durrett: Treating Rennae Ferraiolo/Extender: Cloretta Ned in Treatment: 21 Vital Signs Time Taken: 14:11 Temperature (F): 9+7.9 Height (in): 70 Pulse (bpm): 97 Weight (lbs): 230 Respiratory Rate (breaths/min): 18 Body Mass Index (BMI): 33 Blood Pressure (mmHg): 160/100 Reference Range: 80 - 120 mg / dl Electronic Signature(s) Unsigned Entered ByCarlene Coria on 11/09/2022 14:11:56 Signature(s): Date(s):

## 2022-11-10 NOTE — Progress Notes (Signed)
[name], [name] ([id number]) [id number] Page 1 of 8 Visit Report for 11/09/2022 Chief Complaint Document Details Patient Name: Date of Service: [name], [name] 11/09/2022 2:00 PM Medical Record Number: [id number] Patient Account Number: [id number] Date of Birth/Sex: Treating RN: [birth year] (56 y.o. 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 [name] Primary Care Provider: PA Haig Prophet, NO Other Clinician: Referring Provider: Treating Provider/Extender: Cloretta Ned in Treatment: 21 History of Present Illness HPI Description: Notes from [facility] under Dr. Glenford Peers care:   ADMISSION6/15/2023This is a 64 year old poorly controlled type II diabetic (last A1c 11.5%). In January of this year, he presented to the hospital with an abscess in his left foot. He also had a foreign body present. It was removed in the ER and he was admitted to the hospital for IV antibiotics. He was subsequently discharged on oral antibiotics. Due to financial concerns, he has not taken his diabetic medications (metformin and 70/30 insulin) and as a result, presented back to the emergency room on June 1 with worsening findings, including frank pus and osteomyelitis on MRI. BKA was recommended, but he  declined. He was taken to the operating room by Dr. Sharol Given who performed an extensive debridement. Cultures were taken and he was placed on IV antibiotics. He saw infectious disease while in the hospital who prescribed a 6-week course of IV antibiotics including cefazolin as well as oral metronidazole and levofloxacin. The infectious disease provider also indicated that he would benefit from below-knee amputation, but the patient desired another opinion and therefore he has been referred to wound care center for further evaluation and management. ABIs obtained while he was in the hospital demonstrate adequate blood flow. Essentially the entire bottom of the patient's left foot has been removed. The heads of the majority of his metatarsal bones are exposed along with their accompanying tendons. Muscle and fat are exposed as well. The wound surface is fairly dry; the patient reports that he was not given any specific wound care instructions other than to place a dry dressing on it after showering. There is no odor or purulent drainage identified. [name], [name] ([id number]) [id number] Page 2 of 8 05/26/2022: The wound measures a little bit smaller today. There is some good granulation tissue beginning to form on the surface. He still has exposed bone and tendon. There is some slough accumulation. He saw infectious disease earlier this week and was frustrated that they recommended amputation. He is interested in another surgical opinion. He continues to smoke but says that he has cut down. He is using a knee scooter for mobility so that he can stay off of his foot. 06/05/2022: He saw Dr. Sharol Given last week who was supportive of our efforts to try to salvage the foot. I referred him to vascular surgery to see Dr. Carlis Abbott but he has not received an appointment yet. He continues on IV antibiotics. He says that after our conversation last week about him being unable to initiate hyperbarics if  he was going to continue to  smoke, he has not had a cigarette since. He is now at 4 weeks of conventional management and his insurance was initiated on July 1. Today, the wound continues to show evidence of improvement. There is good granulation tissue forming. The metatarsals are still exposed but actually have some pink periosteum. There is still some slough and fibrinous exudate present. No odor or purulent drainage. We have just been using Dakin's dressing changes for now. Admission to [place] at [facility]: 06-15-2022 upon evaluation today patient presents for initial inspection here in our clinic though he has been seen by Dr. Celine Ahr in [place] up to this point. I did review her notes and records as well they have been using Dakin's moistened gauze dressing which has done a great job at helping to clean out the bottom of his foot. Please see the discourse above for additional information in regard to treatment course up until the patient arrives here in the [place] clinic today. The patient is ready to get started with HBO therapy as soon as possible I do think based on all my review of his records this seems to be appropriate he does have chronic osteomyelitis is also been offered amputation this is obviously a limb salvage operation here. We are trying to prevent him from having to undergo an extensive amputation which would likely be a below-knee amputation which in turn is going to affect his quality of life he wants to try to avoid that if at all possible I think hyperbaric oxygen therapy is his best bet to achieve that goal. Currently he has been on IV Ancef. That actually ends tomorrow. Following that according to notes it appears he supposed to be taking Levaquin and Flagyl for an additional period of time although I am not certain exactly how long that with the. He has been seeing regional physicians infectious disease in [place] and again that so I recommended that he  contact today in order to find out what the plan is going forward. His most recent hemoglobin A1c as noted above was 11.5 on 05-07-2022 he tells me trying to work on that he is also quit smoking as of this point. 06-22-2022 upon evaluation today patient appears to be doing well currently in regard to his wound all things considered. He is showing signs of a lot of new granulation tissue and overall very pleased. He is changing this once a day with the Dakin's moistened gauze dressing. Fortunately I do not see any evidence of infection locally or systemically at this time. We have been trying to get him into the hyperbaric chamber he was having a lot of issues with sinus pressure and we have made referral and called multiple ENT specialist the earliest we have been able to get his August 9 at [facility] ear nose throat. They are going to try to work him in sooner if they have any opportunities to do so. Obviously I think the sooner he can do this the better. We need to get him in hyperbarics as quickly as possible. 7/26; patient presents for follow-up. He has been using Dakin's wet-to-dry dressings and oil emollient dressing over the exposed bone. He currently denies systemic signs of infection. He states he is scheduled to see infectious disease, Dr Candiss Norse next week. She had ordered an MRI of the left foot and he is scheduled to have this done tomorrow. He currently denies systemic signs of infection Including fever/chills, nausea/vomiting increased warmth or erythema to the wound bed or purulent  drainage. He started hyperbaric oxygen treatment however did not tolerate this due to sinus pressure. He is scheduled to see ENT in 2 weeks for evaluation. He is not continuing HBO until he is cleared by ENT. 07-06-2022 patient presents today for follow-up concerning his plantar foot ulcer. The good news is this actually is significantly better compared to previous. Some of the bone which is necrotic is starting to  slough off and I am going to perform debridement to clear this away today. Nonetheless I do think that the patient is going to need hyperbarics and we need to try to get it as soon as possible. He still having a lot of sinus pressure I can even hear the congestion today. He is on 3 different antibiotics cefadroxil, metronidazole, and Levaquin currently for the osteomyelitis. For that reason this should actually help with the infection if he does have a chronic sinusitis I am also going to see about getting him on steroids to try to see if this can benefit him as well. He voiced understanding and he is in agreement with giving that shot. This is probably can to spike his blood sugars which I discussed with him he is going to be seeing his primary care provider later today I want him to let her know as well what is going on and why so that she could be aware but I really do think he needs to take the prednisone. We need to get him in the chamber as soon as possible to try to help with getting this wound to heal and closed so that he does not lose his foot. 07-13-2022 upon evaluation today patient appears to be doing better in regard to his wound he continues to show signs of new granulation am going to perform some debridement today but overall this is doing quite well. 07-20-2022 upon evaluation today patient's wound is actually showing some signs of improvement he does have 1 area on the fifth metatarsal region where there is some necrotic tendon that is noted at this point. Unfortunately this is preventing good granulation and over this point the rest of the metatarsal region is completely covered over with granulation tissue which is great news there is some other slough and biofilm buildup that I will clearway as well. 07-27-2022 upon evaluation today patient appears to be doing well currently in regard to his plantar foot ulcer. He has been tolerating the dressing changes without complication  fortunately. I do believe he is making excellent progress and each time I see him this is better and better is pretty much completely covered as far as any bone exposure is concerned and he still is on the IV antibiotics were still also undergoing hyperbarics at this point that he just got started and is doing quite well with since getting the tubes in his ears.Marland Kitchen 08-03-2022 upon evaluation today patient appears to be doing well in general although has been having a lot of drainage compared to normal. It was actually leaking through his dressing today. He tells me he changed it last night. With that being said he has been up on his feet a lot more which I think is probably a big part of the issue here. Fortunately I do not see any signs of infection locally or systemically. 08-17-2022 upon evaluation today patient is doing well with regard to his wound this is actually measuring smaller which is good news. I am very pleased in that regard. With that being said he has not  been participating HBO therapy as he really needs to afford to see this heal appropriately and completely. In fact he has only made 9 total HBO treatments in the past 4 weeks. Again this is not therapeutic and I discussed that with the patient. Nonetheless he is still improving and since the hyperbarics is not helping based on the discussion today the patient is going to discontinue HBO therapy at this point. He tells me that getting here 5 days a week just is not possible. He has had difficulty getting here and then also when he was here had difficulty with getting here at the right time which has also been part of the problem. Nonetheless either way I am pleased that he is doing better and my hope is that we will still be able to get him healed obviously he should continue with the antibiotics as well as the wound care as he has been doing this seems to be doing excellent for him but everything is improving quite nicely. 08-24-2022 upon  evaluation today patient appears to be doing well currently in regard to his wound this is actually measuring significantly better even compared to last week. I am very pleased in that regard I do not see any signs of infection and I think he is making excellent progress here. 08-31-2022 upon evaluation today patient's wound is actually showing signs of significant improvement. I am actually very pleased with where things stand currently. There does not appear to be any evidence of active infection locally or systemically at this time. No fevers, chills, nausea, vomiting, or diarrhea. 09-19-2022 upon evaluation patient's wound is actually showing signs of improvement though it still having quite a bit of drainage. I do believe he would benefit from switching over to [facility]. I discussed that with him today. He is definitely in agreement with that plan. 09-28-2022 upon evaluation patient's wound is actually showing signs of improvement. With that being said he has been using the [facility] but he has not had enough to really cover the whole wound. In fact he tells me he just got his supplies today that we had ordered last week. Fortunately I do not see any evidence of active infection at this time. 10-05-2022 upon evaluation today patient appears to be doing well currently in regard to his foot wound there is actually much less debridement today to be done compared to previous. Fortunately I see no signs of active infection locally or systemically at this time. 11-02-2022 upon evaluation today patient appears to be doing well currently in regard to his wounds on the foot. Fortunately he seems to be making good progress is not quite as much of an improvement this week as it was last time I saw him but he had a lot going on and tells me that he has been up moving on [place], [place] ([id number]) [id number] Page 3 of 8 it more. I do believe he needs to try to get this under  control and I do believe staying off of it more will definitely help in that regard. 11-09-2022 upon evaluation today patient appears to be doing well currently in regard to his wound. He is showing signs of improvement which is great news. Fortunately I do not see any evidence of active infection locally nor systemically at this time. No fever or chills noted Electronic Signature(s) Signed: 11/09/2022 2:39:11 PM By: Worthy Keeler PA-C Entered By: Worthy Keeler on 11/09/2022 14:39:10 -------------------------------------------------------------------------------- Physical Exam Details Patient Name: Date of  Service: [name], [name] 11/09/2022 2:00 PM Medical Record Number: [id number] Patient Account Number: [id number] Date of Birth/Sex: Treating RN: Jan 01, 1966 (56 y.o. [name]) Carlene Coria Primary Care Provider: PA Haig Prophet, [place] Other Clinician: Referring Provider: Treating Provider/Extender: Cloretta Ned in Treatment: 21 Constitutional Well-nourished and well-hydrated in no acute distress. Respiratory normal breathing without difficulty. Psychiatric this patient is able to make decisions and demonstrates good insight into disease process. Alert and Oriented x 3. pleasant and cooperative. Notes upon inspection patient's wound bed actually showed signs of good granulation and epithelization at this point. Fortunately I do not see any evidence of active infection locally nor systemically which is great news and overall I am extremely pleased with where we stand at this point. no fevers, chills, nausea, vomiting, or diarrhea. Electronic Signature(s) Signed: 11/09/2022 2:39:51 PM By: Worthy Keeler PA-C Entered By: Worthy Keeler on 11/09/2022 14:39:51 -------------------------------------------------------------------------------- Physician Orders Details Patient Name: Date of Service: [name], [name] 11/09/2022 2:00 PM Medical Record Number: [id number] Patient Account Number:  [id number] Date of Birth/Sex: Treating RN: 02-Sep-1966 (56 y.o. [name]) Carlene Coria Primary Care Provider: PA Haig Prophet, NO Other Clinician: Referring Provider: Treating Provider/Extender: Cloretta Ned in Treatment: 21 Verbal / Phone Orders: No Diagnosis Coding PRITHVI, KOOI ([id number]) [id number] Page 4 of 8 ICD-10 Coding Code Description [phone] Other chronic osteomyelitis, left ankle and foot E11.621 Type 2 diabetes mellitus with foot ulcer L97.524 Non-pressure chronic ulcer of other part of left foot with necrosis of bone Follow-up Appointments Return Appointment in 1 week. Bathing/ Shower/ Hygiene May shower; gently cleanse wound with antibacterial soap, rinse and pat dry prior to dressing wounds Anesthetic (Use 'Patient Medications' Section for Anesthetic Order Entry) Lidocaine applied to wound bed Edema Control - Lymphedema / Segmental Compressive Device / Other Elevate, Exercise Daily and A void Standing for Long Periods of Time. Elevate legs to the level of the heart and pump ankles as often as possible Elevate leg(s) parallel to the floor when sitting. Wound Treatment Wound #1 - Foot Wound Laterality: Plantar, Left Cleanser: [place] Ancillary Kit - 15 Day Supply 1 x Per Day/30 Days Discharge Instructions: Use supplies as instructed; Kit contains: (15) Saline Bullets; (15) 3x3 Gauze; 15 pr Gloves Prim Dressing: Hydrofera Blue Ready Transfer Foam, 4x5 (in/in) (Generic) 1 x Per Day/30 Days ary Discharge Instructions: Apply Hydrofera Blue Ready to wound bed as directed Secondary Dressing: ABD Pad 5x9 (in/in) (Generic) 1 x Per Day/30 Days Discharge Instructions: Cover with ABD pad Secondary Dressing: Kerlix 4.5 x 4.1 (in/yd) (Generic) 1 x Per Day/30 Days Discharge Instructions: Apply Kerlix 4.5 x 4.1 (in/yd) as instructed Secured With: Medipore T - 83M Medipore H Soft Cloth Surgical T ape ape, 2x2 (in/yd) (Generic) 1 x Per Day/30  Days Electronic Signature(s) Signed: 11/09/2022 4:35:46 PM By: Worthy Keeler PA-C Signed: 11/14/2022 4:34:43 PM By: Carlene Coria RN Entered By: Carlene Coria on 11/09/2022 14:32:44 -------------------------------------------------------------------------------- Problem List Details Patient Name: Date of Service: [name], [name] 11/09/2022 2:00 PM Medical Record Number: [id number] Patient Account Number: [id number] Date of Birth/Sex: Treating RN: 1966-01-28 (56 y.o. [name] Primary Care Provider: PA Haig Prophet, NO Other Clinician: Referring Provider: Treating Provider/Extender: Cloretta Ned in Treatment: 21 Active Problems ICD-10 Encounter Code Description Active Date MDM Diagnosis [phone] Other chronic osteomyelitis, left ankle and foot 06/15/2022 No Yes JAFRI, Dyland ([id number]) [id number] Page 5 of 8 E11.621 Type 2 diabetes mellitus with foot ulcer 06/15/2022 No Yes L97.524 Non-pressure chronic ulcer of  other part of left foot with necrosis of bone 06/15/2022 No Yes Inactive Problems Resolved Problems Electronic Signature(s) Signed: 11/09/2022 2:09:16 PM By: Worthy Keeler PA-C Entered By: Worthy Keeler on 11/09/2022 14:09:16 -------------------------------------------------------------------------------- Progress Note Details Patient Name: Date of Service: [name], [name] 11/09/2022 2:00 PM Medical Record Number: [id number] Patient Account Number: [id number] Date of Birth/Sex: Treating RN: October 01, 1966 (56 y.o. 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 Glenford Peers  care:   ADMISSION6/15/2023This is a 40 year old poorly controlled type II diabetic (last A1c 11.5%). In January of this year, he presented to the hospital with an abscess in his left foot. He also had a foreign body present. It was removed in the ER and he was admitted to the hospital for IV antibiotics. He was subsequently discharged on oral antibiotics. Due to financial concerns, he has not taken his diabetic medications (metformin and 70/30 insulin) and as a result, presented back to the emergency room on June 1 with worsening findings, including frank pus and osteomyelitis on MRI. BKA was recommended, but he declined. He was taken to the operating room by Dr. Sharol Given who performed an extensive debridement. Cultures were taken and he was placed on IV antibiotics. He saw infectious disease while in the hospital who prescribed a 6-week course of IV antibiotics including cefazolin as well as oral metronidazole and levofloxacin. The infectious disease provider also indicated that he would benefit from below-knee amputation, but the patient desired another opinion and therefore he has been referred to wound care center for further evaluation and management. ABIs obtained while he was in the hospital demonstrate adequate blood flow. Essentially the entire bottom of the patient's left foot has been removed. The heads of the majority of his metatarsal bones are exposed along with their accompanying tendons. Muscle and fat are exposed as well. The wound surface is fairly dry; the patient reports that he was not given any specific wound care instructions other than to place a dry dressing on it after showering. There is no odor or purulent drainage identified. 05/26/2022: The wound measures a little bit smaller today. There is some good granulation tissue beginning to form on the surface. He still has exposed bone and tendon. There is some slough accumulation. He saw infectious disease earlier this week and was  frustrated that they recommended amputation. He is interested in another surgical opinion. He continues to smoke but says that he has cut down. He is using a knee scooter for mobility so that he can stay off of his foot. 06/05/2022: He saw Dr. Sharol Given last week who was supportive of our efforts to try to salvage the foot. I referred him to vascular surgery to see Dr. Carlis Abbott but he has not received an appointment yet. He continues on IV antibiotics. He says that after our conversation last week about him being unable to initiate hyperbarics if he was going to continue to smoke, he has not had a cigarette since. He is now at 4 weeks of conventional management and his insurance was initiated on July 1. Today, the wound continues to show evidence of improvement. There is good granulation tissue forming. The metatarsals are still exposed but actually have some pink periosteum. There is still some slough and  fibrinous exudate present. No odor or purulent drainage. We have just been using Dakin's dressing changes for now. Admission to [place] at [facility]: 06-15-2022 upon evaluation today patient presents for initial inspection here in our clinic though he has been seen by Dr. Celine Ahr in [place] up to this point. [name], [name] ([id number]) [id number] Page 6 of 8 I did review her notes and records as well they have been using Dakin's moistened gauze dressing which has done a great job at helping to clean out the bottom of his foot. Please see the discourse above for additional information in regard to treatment course up until the patient arrives here in the [place] clinic today. The patient is ready to get started with HBO therapy as soon as possible I do think based on all my review of his records this seems to be appropriate he does have chronic osteomyelitis is also been offered amputation this is obviously a limb salvage operation here. We are trying to prevent him from  having to undergo an extensive amputation which would likely be a below-knee amputation which in turn is going to affect his quality of life he wants to try to avoid that if at all possible I think hyperbaric oxygen therapy is his best bet to achieve that goal. Currently he has been on IV Ancef. That actually ends tomorrow. Following that according to notes it appears he supposed to be taking Levaquin and Flagyl for an additional period of time although I am not certain exactly how long that with the. He has been seeing regional physicians infectious disease in [employer] and again that so I recommended that he contact today in order to find out what the plan is going forward. His most recent hemoglobin A1c as noted above was 11.5 on 05-07-2022 he tells me trying to work on that he is also quit smoking as of this point. 06-22-2022 upon evaluation today patient appears to be doing well currently in regard to his wound all things considered. He is showing signs of a lot of new granulation tissue and overall very pleased. He is changing this once a day with the Dakin's moistened gauze dressing. Fortunately I do not see any evidence of infection locally or systemically at this time. We have been trying to get him into the hyperbaric chamber he was having a lot of issues with sinus pressure and we have made referral and called multiple ENT specialist the earliest we have been able to get his August 9 at [facility] ear nose throat. They are going to try to work him in sooner if they have any opportunities to do so. Obviously I think the sooner he can do this the better. We need to get him in hyperbarics as quickly as possible. 7/26; patient presents for follow-up. He has been using Dakin's wet-to-dry dressings and oil emollient dressing over the exposed bone. He currently denies systemic signs of infection. He states he is scheduled to see infectious disease, Dr Candiss Norse next week. She had ordered an MRI of the left  foot and he is scheduled to have this done tomorrow. He currently denies systemic signs of infection Including fever/chills, nausea/vomiting increased warmth or erythema to the wound bed or purulent drainage. He started hyperbaric oxygen treatment however did not tolerate this due to sinus pressure. He is scheduled to see ENT in 2 weeks for evaluation. He is not continuing HBO until he is cleared by ENT. 07-06-2022 patient presents today for follow-up concerning his plantar foot ulcer.  The good news is this actually is significantly better compared to previous. Some of the bone which is necrotic is starting to slough off and I am going to perform debridement to clear this away today. Nonetheless I do think that the patient is going to need hyperbarics and we need to try to get it as soon as possible. He still having a lot of sinus pressure I can even hear the congestion today. He is on 3 different antibiotics cefadroxil, metronidazole, and Levaquin currently for the osteomyelitis. For that reason this should actually help with the infection if he does have a chronic sinusitis I am also going to see about getting him on steroids to try to see if this can benefit him as well. He voiced understanding and he is in agreement with giving that shot. This is probably can to spike his blood sugars which I discussed with him he is going to be seeing his primary care provider later today I want him to let her know as well what is going on and why so that she could be aware but I really do think he needs to take the prednisone. We need to get him in the chamber as soon as possible to try to help with getting this wound to heal and closed so that he does not lose his foot. 07-13-2022 upon evaluation today patient appears to be doing better in regard to his wound he continues to show signs of new granulation am going to perform some debridement today but overall this is doing quite well. 07-20-2022 upon evaluation  today patient's wound is actually showing some signs of improvement he does have 1 area on the fifth metatarsal region where there is some necrotic tendon that is noted at this point. Unfortunately this is preventing good granulation and over this point the rest of the metatarsal region is completely covered over with granulation tissue which is great news there is some other slough and biofilm buildup that I will clearway as well. 07-27-2022 upon evaluation today patient appears to be doing well currently in regard to his plantar foot ulcer. He has been tolerating the dressing changes without complication fortunately. I do believe he is making excellent progress and each time I see him this is better and better is pretty much completely covered as far as any bone exposure is concerned and he still is on the IV antibiotics were still also undergoing hyperbarics at this point that he just got started and is doing quite well with since getting the tubes in his ears.Marland Kitchen 08-03-2022 upon evaluation today patient appears to be doing well in general although has been having a lot of drainage compared to normal. It was actually leaking through his dressing today. He tells me he changed it last night. With that being said he has been up on his feet a lot more which I think is probably a big part of the issue here. Fortunately I do not see any signs of infection locally or systemically. 08-17-2022 upon evaluation today patient is doing well with regard to his wound this is actually measuring smaller which is good news. I am very pleased in that regard. With that being said he has not been participating HBO therapy as he really needs to afford to see this heal appropriately and completely. In fact he has only made 9 total HBO treatments in the past 4 weeks. Again this is not therapeutic and I discussed that with the patient. Nonetheless he is still improving  and since the hyperbarics is not helping based on the  discussion today the patient is going to discontinue HBO therapy at this point. He tells me that getting here 5 days a week just is not possible. He has had difficulty getting here and then also when he was here had difficulty with getting here at the right time which has also been part of the problem. Nonetheless either way I am pleased that he is doing better and my hope is that we will still be able to get him healed obviously he should continue with the antibiotics as well as the wound care as he has been doing this seems to be doing excellent for him but everything is improving quite nicely. 08-24-2022 upon evaluation today patient appears to be doing well currently in regard to his wound this is actually measuring significantly better even compared to last week. I am very pleased in that regard I do not see any signs of infection and I think he is making excellent progress here. 08-31-2022 upon evaluation today patient's wound is actually showing signs of significant improvement. I am actually very pleased with where things stand currently. There does not appear to be any evidence of active infection locally or systemically at this time. No fevers, chills, nausea, vomiting, or diarrhea. 09-19-2022 upon evaluation patient's wound is actually showing signs of improvement though it still having quite a bit of drainage. I do believe he would benefit from switching over to [facility]. I discussed that with him today. He is definitely in agreement with that plan. 09-28-2022 upon evaluation patient's wound is actually showing signs of improvement. With that being said he has been using the [facility] but he has not had enough to really cover the whole wound. In fact he tells me he just got his supplies today that we had ordered last week. Fortunately I do not see any evidence of active infection at this time. 10-05-2022 upon evaluation today patient appears to be doing well currently in regard to  his foot wound there is actually much less debridement today to be done compared to previous. Fortunately I see no signs of active infection locally or systemically at this time. 11-02-2022 upon evaluation today patient appears to be doing well currently in regard to his wounds on the foot. Fortunately he seems to be making good progress is not quite as much of an improvement this week as it was last time I saw him but he had a lot going on and tells me that he has been up moving on it more. I do believe he needs to try to get this under control and I do believe staying off of it more will definitely help in that regard. 11-09-2022 upon evaluation today patient appears to be doing well currently in regard to his wound. He is showing signs of improvement which is great news. Fortunately I do not see any evidence of active infection locally nor systemically at this time. No fever or chills noted Objective [name], [name] ([id number]) [id number] Page 7 of 8 Constitutional Well-nourished and well-hydrated in no acute distress. Vitals Time Taken: 2:11 PM, Height: 70 in, Weight: 230 lbs, BMI: 33, Temperature: 9+7.9 F, Pulse: 97 bpm, Respiratory Rate: 18 breaths/min, Blood Pressure: 160/100 mmHg. Respiratory normal breathing without difficulty. Psychiatric this patient is able to make decisions and demonstrates good insight into disease process. Alert and Oriented x 3. pleasant and cooperative. General Notes: upon inspection patient's wound bed actually showed signs of good  granulation and epithelization at this point. Fortunately I do not see any evidence of active infection locally nor systemically which is great news and overall I am extremely pleased with where we stand at this point. no fevers, chills, nausea, vomiting, or diarrhea. Integumentary (Hair, Skin) Wound #1 status is Open. Original cause of wound was Gradually Appeared. The date acquired was: 12/04/2021. The wound  has been in treatment 21 weeks. The wound is located on the [place]. The wound measures 9cm length x 6.5cm width x 0.3cm depth; 45.946cm^2 area and 13.784cm^3 volume. There is Fat Layer (Subcutaneous Tissue) exposed. There is no tunneling or undermining noted. There is a medium amount of serosanguineous drainage noted. There is medium (34-66%) pink, hyper - granulation within the wound bed. There is a medium (34-66%) amount of necrotic tissue within the wound bed including Adherent Slough. Assessment Active Problems ICD-10 Other chronic osteomyelitis, left ankle and foot Type 2 diabetes mellitus with foot ulcer Non-pressure chronic ulcer of other part of left foot with necrosis of bone Plan Follow-up Appointments: Return Appointment in 1 week. Bathing/ Shower/ Hygiene: May shower; gently cleanse wound with antibacterial soap, rinse and pat dry prior to dressing wounds Anesthetic (Use 'Patient Medications' Section for Anesthetic Order Entry): Lidocaine applied to wound bed Edema Control - Lymphedema / Segmental Compressive Device / Other: Elevate, Exercise Daily and Avoid Standing for Long Periods of Time. Elevate legs to the level of the heart and pump ankles as often as possible Elevate leg(s) parallel to the floor when sitting. WOUND #1: - Foot Wound Laterality: Plantar, Left Cleanser: [place]ram Ancillary Kit - 15 Day Supply 1 x Per Day/30 Days Discharge Instructions: Use supplies as instructed; Kit contains: (15) Saline Bullets; (15) 3x3 Gauze; 15 pr Gloves Prim Dressing: Hydrofera Blue Ready Transfer Foam, 4x5 (in/in) (Generic) 1 x Per Day/30 Days ary Discharge Instructions: Apply Hydrofera Blue Ready to wound bed as directed Secondary Dressing: ABD Pad 5x9 (in/in) (Generic) 1 x Per Day/30 Days Discharge Instructions: Cover with ABD pad Secondary Dressing: Kerlix 4.5 x 4.1 (in/yd) (Generic) 1 x Per Day/30 Days Discharge Instructions: Apply Kerlix 4.5 x 4.1 (in/yd) as  instructed Secured With: Medipore T - 36M Medipore H Soft Cloth Surgical T ape ape, 2x2 (in/yd) (Generic) 1 x Per Day/30 Days 1. I am going to recommend that we have the patient continue to monitor for any signs of infection or worsening in general. Overall I think you were headed in the right direction I do believe they are making good progress here. 2. Also can recommend that we have the patient continue with the [facility] dressing which is doing an awesome job here. 3. I would also recommend that we continue with the ABD pad followed by the roll gauze to secure in place. 4. We are so working on getting the [employer] approved and will see if we can if we get it approved and we will see about going ahead forward with ordering this for application. We will see patient back for reevaluation in 1 week here in the clinic. If anything worsens or changes patient will contact our office for additional recommendations. Electronic Signature(s) Signed: 11/09/2022 2:40:33 PM By: Worthy Keeler PA-C Entered By: Worthy Keeler on 11/09/2022 14:40:33 Hassell Done, Truman Hayward ([id number]) [id number] Page 8 of 8 -------------------------------------------------------------------------------- SuperBill Details Patient Name: Date of Service: [name], [name] 11/09/2022 Medical Record Number: [id number] Patient Account Number: [id number] Date of Birth/Sex: Treating RN: 1966/07/21 (56 y.o. [name]) Carlene Coria Primary Care Provider: PA  Haig Prophet, NO Other Clinician: Referring Provider: Treating Provider/Extender: Cloretta Ned in Treatment: 21 Diagnosis Coding ICD-10 Codes Code Description [phone] Other chronic osteomyelitis, left ankle and foot E11.621 Type 2 diabetes mellitus with foot ulcer L97.524 Non-pressure chronic ulcer of other part of left foot with necrosis of bone Facility Procedures : CPT4 Code: [id number] Description: [phone] - WOUND CARE VISIT-LEV 2 EST  PT Modifier: Quantity: 1 Physician Procedures : CPT4 Code Description Modifier [id number] - WC PHYS LEVEL 3 - EST PT ICD-10 Diagnosis Description M86.672 Other chronic osteomyelitis, left ankle and foot E11.621 Type 2 diabetes mellitus with foot ulcer L97.524 Non-pressure chronic ulcer of other  part of left foot with necrosis of bone Quantity: 1 Electronic Signature(s) Signed: 11/09/2022 2:40:48 PM By: Worthy Keeler PA-C Entered By: Worthy Keeler on 11/09/2022 14:40:47

## 2022-11-10 NOTE — Telephone Encounter (Signed)
error 

## 2022-11-16 ENCOUNTER — Encounter: Payer: 59 | Admitting: Physician Assistant

## 2022-11-16 DIAGNOSIS — L97524 Non-pressure chronic ulcer of other part of left foot with necrosis of bone: Secondary | ICD-10-CM | POA: Diagnosis not present

## 2022-11-16 DIAGNOSIS — E1169 Type 2 diabetes mellitus with other specified complication: Secondary | ICD-10-CM | POA: Diagnosis not present

## 2022-11-16 DIAGNOSIS — M86672 Other chronic osteomyelitis, left ankle and foot: Secondary | ICD-10-CM | POA: Diagnosis not present

## 2022-11-16 DIAGNOSIS — L97522 Non-pressure chronic ulcer of other part of left foot with fat layer exposed: Secondary | ICD-10-CM | POA: Diagnosis not present

## 2022-11-16 DIAGNOSIS — E11621 Type 2 diabetes mellitus with foot ulcer: Secondary | ICD-10-CM | POA: Diagnosis not present

## 2022-11-16 NOTE — Progress Notes (Addendum)
[name], [name] ([id number]) [id number] Page 1 of 8 Visit Report for 11/16/2022 Chief Complaint Document Details Patient Name: Date of Service: [name], [name] 11/16/2022 3:30 PM Medical Record Number: [id number] Patient Account Number: [id number] Date of Birth/Sex: Treating RN: 23-May-1966 (56 y.o. 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 [name] Primary Care Provider: PA Haig Prophet, NO Other Clinician: Referring Provider: Treating Provider/Extender: Cloretta Ned in Treatment: 59 History of Present Illness HPI Description: Notes from [facility] under Dr. Glenford Peers care:   ADMISSION6/15/2023This is a 60 year old poorly controlled type II diabetic (last A1c 11.5%). In January of this year, he presented to the hospital with an abscess in his left foot. He also had a foreign body present. It was removed in the ER and he was admitted to the hospital for IV antibiotics. He was subsequently discharged on oral antibiotics. Due to financial concerns, he has not taken his diabetic medications (metformin and 70/30 insulin) and as a result, presented back to the emergency room on June 1 with worsening findings, including frank pus and osteomyelitis on MRI. BKA was recommended, but he  declined. He was taken to the operating room by Dr. Sharol Given who performed an extensive debridement. Cultures were taken and he was placed on IV antibiotics. He saw infectious disease while in the hospital who prescribed a 6-week course of IV antibiotics including cefazolin as well as oral metronidazole and levofloxacin. The infectious disease provider also indicated that he would benefit from below-knee amputation, but the patient desired another opinion and therefore he has been referred to wound care center for further evaluation and management. ABIs obtained while he was in the hospital demonstrate adequate blood flow. Essentially the entire bottom of the patient's left foot has been removed. The heads of the majority of his metatarsal bones are exposed along with their accompanying tendons. Muscle and fat are exposed as well. The wound surface is fairly dry; the patient reports that he was not given any specific wound care instructions other than to place a dry dressing on it after showering. There is no odor or purulent drainage identified. [name], [name] ([id number]) [id number] Page 2 of 8 05/26/2022: The wound measures a little bit smaller today. There is some good granulation tissue beginning to form on the surface. He still has exposed bone and tendon. There is some slough accumulation. He saw infectious disease earlier this week and was frustrated that they recommended amputation. He is interested in another surgical opinion. He continues to smoke but says that he has cut down. He is using a knee scooter for mobility so that he can stay off of his foot. 06/05/2022: He saw Dr. Sharol Given last week who was supportive of our efforts to try to salvage the foot. I referred him to vascular surgery to see Dr. Carlis Abbott but he has not received an appointment yet. He continues on IV antibiotics. He says that after our conversation last week about him being unable to initiate hyperbarics if  he was going to continue to  smoke, he has not had a cigarette since. He is now at 4 weeks of conventional management and his insurance was initiated on July 1. Today, the wound continues to show evidence of improvement. There is good granulation tissue forming. The metatarsals are still exposed but actually have some pink periosteum. There is still some slough and fibrinous exudate present. No odor or purulent drainage. We have just been using Dakin's dressing changes for now. Admission to [place] at [facility]: 06-15-2022 upon evaluation today patient presents for initial inspection here in our clinic though he has been seen by Dr. Celine Ahr in [place] up to this point. I did review her notes and records as well they have been using Dakin's moistened gauze dressing which has done a great job at helping to clean out the bottom of his foot. Please see the discourse above for additional information in regard to treatment course up until the patient arrives here in the [place] clinic today. The patient is ready to get started with HBO therapy as soon as possible I do think based on all my review of his records this seems to be appropriate he does have chronic osteomyelitis is also been offered amputation this is obviously a limb salvage operation here. We are trying to prevent him from having to undergo an extensive amputation which would likely be a below-knee amputation which in turn is going to affect his quality of life he wants to try to avoid that if at all possible I think hyperbaric oxygen therapy is his best bet to achieve that goal. Currently he has been on IV Ancef. That actually ends tomorrow. Following that according to notes it appears he supposed to be taking Levaquin and Flagyl for an additional period of time although I am not certain exactly how long that with the. He has been seeing regional physicians infectious disease in [place] and again that so I recommended that he  contact today in order to find out what the plan is going forward. His most recent hemoglobin A1c as noted above was 11.5 on 05-07-2022 he tells me trying to work on that he is also quit smoking as of this point. 06-22-2022 upon evaluation today patient appears to be doing well currently in regard to his wound all things considered. He is showing signs of a lot of new granulation tissue and overall very pleased. He is changing this once a day with the Dakin's moistened gauze dressing. Fortunately I do not see any evidence of infection locally or systemically at this time. We have been trying to get him into the hyperbaric chamber he was having a lot of issues with sinus pressure and we have made referral and called multiple ENT specialist the earliest we have been able to get his August 9 at [facility] ear nose throat. They are going to try to work him in sooner if they have any opportunities to do so. Obviously I think the sooner he can do this the better. We need to get him in hyperbarics as quickly as possible. 7/26; patient presents for follow-up. He has been using Dakin's wet-to-dry dressings and oil emollient dressing over the exposed bone. He currently denies systemic signs of infection. He states he is scheduled to see infectious disease, Dr Candiss Norse next week. She had ordered an MRI of the left foot and he is scheduled to have this done tomorrow. He currently denies systemic signs of infection Including fever/chills, nausea/vomiting increased warmth or erythema to the wound bed or purulent  drainage. He started hyperbaric oxygen treatment however did not tolerate this due to sinus pressure. He is scheduled to see ENT in 2 weeks for evaluation. He is not continuing HBO until he is cleared by ENT. 07-06-2022 patient presents today for follow-up concerning his plantar foot ulcer. The good news is this actually is significantly better compared to previous. Some of the bone which is necrotic is starting to  slough off and I am going to perform debridement to clear this away today. Nonetheless I do think that the patient is going to need hyperbarics and we need to try to get it as soon as possible. He still having a lot of sinus pressure I can even hear the congestion today. He is on 3 different antibiotics cefadroxil, metronidazole, and Levaquin currently for the osteomyelitis. For that reason this should actually help with the infection if he does have a chronic sinusitis I am also going to see about getting him on steroids to try to see if this can benefit him as well. He voiced understanding and he is in agreement with giving that shot. This is probably can to spike his blood sugars which I discussed with him he is going to be seeing his primary care provider later today I want him to let her know as well what is going on and why so that she could be aware but I really do think he needs to take the prednisone. We need to get him in the chamber as soon as possible to try to help with getting this wound to heal and closed so that he does not lose his foot. 07-13-2022 upon evaluation today patient appears to be doing better in regard to his wound he continues to show signs of new granulation am going to perform some debridement today but overall this is doing quite well. 07-20-2022 upon evaluation today patient's wound is actually showing some signs of improvement he does have 1 area on the fifth metatarsal region where there is some necrotic tendon that is noted at this point. Unfortunately this is preventing good granulation and over this point the rest of the metatarsal region is completely covered over with granulation tissue which is great news there is some other slough and biofilm buildup that I will clearway as well. 07-27-2022 upon evaluation today patient appears to be doing well currently in regard to his plantar foot ulcer. He has been tolerating the dressing changes without complication  fortunately. I do believe he is making excellent progress and each time I see him this is better and better is pretty much completely covered as far as any bone exposure is concerned and he still is on the IV antibiotics were still also undergoing hyperbarics at this point that he just got started and is doing quite well with since getting the tubes in his ears.Marland Kitchen 08-03-2022 upon evaluation today patient appears to be doing well in general although has been having a lot of drainage compared to normal. It was actually leaking through his dressing today. He tells me he changed it last night. With that being said he has been up on his feet a lot more which I think is probably a big part of the issue here. Fortunately I do not see any signs of infection locally or systemically. 08-17-2022 upon evaluation today patient is doing well with regard to his wound this is actually measuring smaller which is good news. I am very pleased in that regard. With that being said he has not  been participating HBO therapy as he really needs to afford to see this heal appropriately and completely. In fact he has only made 9 total HBO treatments in the past 4 weeks. Again this is not therapeutic and I discussed that with the patient. Nonetheless he is still improving and since the hyperbarics is not helping based on the discussion today the patient is going to discontinue HBO therapy at this point. He tells me that getting here 5 days a week just is not possible. He has had difficulty getting here and then also when he was here had difficulty with getting here at the right time which has also been part of the problem. Nonetheless either way I am pleased that he is doing better and my hope is that we will still be able to get him healed obviously he should continue with the antibiotics as well as the wound care as he has been doing this seems to be doing excellent for him but everything is improving quite nicely. 08-24-2022 upon  evaluation today patient appears to be doing well currently in regard to his wound this is actually measuring significantly better even compared to last week. I am very pleased in that regard I do not see any signs of infection and I think he is making excellent progress here. 08-31-2022 upon evaluation today patient's wound is actually showing signs of significant improvement. I am actually very pleased with where things stand currently. There does not appear to be any evidence of active infection locally or systemically at this time. No fevers, chills, nausea, vomiting, or diarrhea. 09-19-2022 upon evaluation patient's wound is actually showing signs of improvement though it still having quite a bit of drainage. I do believe he would benefit from switching over to [facility]. I discussed that with him today. He is definitely in agreement with that plan. 09-28-2022 upon evaluation patient's wound is actually showing signs of improvement. With that being said he has been using the [facility] but he has not had enough to really cover the whole wound. In fact he tells me he just got his supplies today that we had ordered last week. Fortunately I do not see any evidence of active infection at this time. 10-05-2022 upon evaluation today patient appears to be doing well currently in regard to his foot wound there is actually much less debridement today to be done compared to previous. Fortunately I see no signs of active infection locally or systemically at this time. 11-02-2022 upon evaluation today patient appears to be doing well currently in regard to his wounds on the foot. Fortunately he seems to be making good progress is not quite as much of an improvement this week as it was last time I saw him but he had a lot going on and tells me that he has been up moving on [place], [place] ([id number]) [id number] Page 3 of 8 it more. I do believe he needs to try to get this under  control and I do believe staying off of it more will definitely help in that regard. 11-09-2022 upon evaluation today patient appears to be doing well currently in regard to his wound. He is showing signs of improvement which is great news. Fortunately I do not see any evidence of active infection locally nor systemically at this time. No fever or chills noted 11-16-2022 patient's wound still slowly seems to be making progress. The good news is we have gotten the approval for the [employer] which is awesome. Working to  see about getting that started form hopefully will be able to get to him for the end of the year and then subsequently we can see if we can regain the approval for the final 3 in the first of the new year. Nonetheless I think getting this started as quickly as possible would be good for the patient currently. I do believe this is going to have a good result as far as helping with the healing and new tissue growth and epithelization in regard to the wound. He still using the offloading with a knee scooter at this point. Electronic Signature(s) Signed: 11/16/2022 4:58:14 PM By: Worthy Keeler PA-C Entered By: Worthy Keeler on 11/16/2022 16:58:13 -------------------------------------------------------------------------------- Physical Exam Details Patient Name: Date of Service: CAMAURI, CRATON 11/16/2022 3:30 PM Medical Record Number: [id number] Patient Account Number: [id number] Date of Birth/Sex: Treating RN: 1966-10-12 (56 y.o. [name] Primary Care Provider: PA Haig Prophet, [place] Other Clinician: Referring Provider: Treating Provider/Extender: Cloretta Ned in Treatment: 71 Constitutional Well-nourished and well-hydrated in no acute distress. Respiratory normal breathing without difficulty. Psychiatric this patient is able to make decisions and demonstrates good insight into disease process. Alert and Oriented x 3. pleasant and cooperative. Notes Upon  inspection patient's wound bed actually showed signs of good granulation epithelization at this point. Fortunately I do not see any evidence of active infection locally nor systemically which is great news and overall I am extremely pleased with where we stand currently. No fevers, chills, nausea, vomiting, or diarrhea. Electronic Signature(s) Signed: 11/16/2022 4:58:45 PM By: Worthy Keeler PA-C Entered By: Worthy Keeler on 11/16/2022 16:58:45 -------------------------------------------------------------------------------- Physician Orders Details Patient Name: Date of Service: [name], [name] 11/16/2022 3:30 PM Medical Record Number: [id number] Patient Account Number: [id number] Date of Birth/Sex: Treating RN: 1965-12-13 (56 y.o. [name] Primary Care Provider: PA Darnelle Spangle Other Clinician: LAUTARO, KORAL ([id number]) [id number] Page 4 of 8 Referring Provider: Treating Provider/Extender: Cloretta Ned in Treatment: 22 Verbal / Phone Orders: No Diagnosis Coding ICD-10 Coding Code Description [phone] Other chronic osteomyelitis, left ankle and foot E11.621 Type 2 diabetes mellitus with foot ulcer L97.524 Non-pressure chronic ulcer of other part of left foot with necrosis of bone Follow-up Appointments Return Appointment in 1 week. Bathing/ Shower/ Hygiene May shower; gently cleanse wound with antibacterial soap, rinse and pat dry prior to dressing wounds Anesthetic (Use 'Patient Medications' Section for Anesthetic Order Entry) Lidocaine applied to wound bed Edema Control - Lymphedema / Segmental Compressive Device / Other Elevate, Exercise Daily and A void Standing for Long Periods of Time. Elevate legs to the level of the heart and pump ankles as often as possible Elevate leg(s) parallel to the floor when sitting. Wound Treatment Wound #1 - Foot Wound Laterality: Plantar, Left Cleanser: [place] Ancillary Kit - 15 Day Supply 1 x  Per Day/30 Days Discharge Instructions: Use supplies as instructed; Kit contains: (15) Saline Bullets; (15) 3x3 Gauze; 15 pr Gloves Prim Dressing: Hydrofera Blue Ready Transfer Foam, 4x5 (in/in) (Generic) 1 x Per Day/30 Days ary Discharge Instructions: Apply Hydrofera Blue Ready to wound bed as directed Secondary Dressing: ABD Pad 5x9 (in/in) (Generic) 1 x Per Day/30 Days Discharge Instructions: Cover with ABD pad Secondary Dressing: Kerlix 4.5 x 4.1 (in/yd) (Generic) 1 x Per Day/30 Days Discharge Instructions: Apply Kerlix 4.5 x 4.1 (in/yd) as instructed Secured With: Medipore T - 82M Medipore H Soft Cloth Surgical T ape ape, 2x2 (in/yd) (Generic) 1 x Per Day/30 Days  Electronic Signature(s) Signed: 11/16/2022 5:02:52 PM By: Worthy Keeler PA-C Signed: 11/18/2022 3:52:05 PM By: Carlene Coria RN Entered By: Carlene Coria on 11/16/2022 15:54:23 -------------------------------------------------------------------------------- Problem List Details Patient Name: Date of Service: [name], [name] 11/16/2022 3:30 PM Medical Record Number: [id number] Patient Account Number: [id number] Date of Birth/Sex: Treating RN: September 06, 1966 (56 y.o. [name] Primary Care Provider: PA Haig Prophet, NO Other Clinician: Referring Provider: Treating Provider/Extender: Cloretta Ned in Treatment: [place], [name] ([id number]) [id number] Page 5 of 8 ICD-10 Encounter Code Description Active Date MDM Diagnosis [phone] Other chronic osteomyelitis, left ankle and foot 06/15/2022 No Yes E11.621 Type 2 diabetes mellitus with foot ulcer 06/15/2022 No Yes L97.524 Non-pressure chronic ulcer of other part of left foot with necrosis of bone 06/15/2022 No Yes Inactive Problems Resolved Problems Electronic Signature(s) Signed: 11/16/2022 3:35:43 PM By: Worthy Keeler PA-C Entered By: Worthy Keeler on 11/16/2022  15:35:43 -------------------------------------------------------------------------------- Progress Note Details Patient Name: Date of Service: [name], [name] 11/16/2022 3:30 PM Medical Record Number: [id number] Patient Account Number: [id number] Date of Birth/Sex: Treating RN: [birth year] (56 y.o. 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 Glenford Peers care:   ADMISSION6/15/2023This is a 47 year old poorly controlled type II diabetic (last A1c 11.5%). In January of this year, he presented to the hospital with an abscess in his left foot. He also had a foreign body present. It was removed in the ER and he was admitted to the hospital for IV antibiotics. He was subsequently discharged on oral antibiotics. Due to financial concerns, he has not taken his diabetic medications (metformin and 70/30 insulin) and as a result, presented back to the emergency room on June 1 with worsening findings, including frank pus and osteomyelitis on MRI. BKA was recommended, but he declined. He was taken to the operating room by Dr. Sharol Given who performed an extensive debridement. Cultures were taken and he was placed on IV antibiotics. He saw infectious disease while in the hospital who prescribed a 6-week course of IV antibiotics including cefazolin as well as oral metronidazole and levofloxacin. The infectious disease provider also indicated that he would benefit from below-knee amputation, but the patient desired another opinion and therefore he has been referred to wound care center for further evaluation and management. ABIs obtained while he was in the hospital demonstrate  adequate blood flow. Essentially the entire bottom of the patient's left foot has been removed. The heads of the majority of his metatarsal bones are exposed along with their accompanying tendons. Muscle and fat are exposed as well. The wound surface is fairly dry; the patient reports that he was not given any specific wound care instructions other than to place a dry dressing on it after showering. There is no odor or purulent drainage identified. 05/26/2022: The wound measures a little bit smaller today. There is some good granulation tissue beginning to form on the surface. He still has exposed bone and tendon. There is some slough accumulation. He saw infectious disease earlier this week and was frustrated that they recommended amputation. He is interested in another surgical opinion. He continues to smoke but says that he has cut down. He is using a knee scooter for mobility so that he can stay  off of his foot. 06/05/2022: He saw Dr. Sharol Given last week who was supportive of our efforts to try to salvage the foot. I referred him to vascular surgery to see Dr. Carlis Abbott but he has not received an appointment yet. He continues on IV antibiotics. He says that after our conversation last week about him being unable to initiate hyperbarics if he was going to continue to smoke, he has not had a cigarette since. He is now at 4 weeks of conventional management and his insurance was [place], JAYLENN ([id number]) [id number] Page 6 of 8 initiated on July 1. Today, the wound continues to show evidence of improvement. There is good granulation tissue forming. The metatarsals are still exposed but actually have some pink periosteum. There is still some slough and fibrinous exudate present. No odor or purulent drainage. We have just been using Dakin's dressing changes for now. Admission to [place] [facility]: 06-15-2022 upon evaluation today patient presents for initial inspection here in our  clinic though he has been seen by Dr. Celine Ahr in [place] up to this point. I did review her notes and records as well they have been using Dakin's moistened gauze dressing which has done a great job at helping to clean out the bottom of his foot. Please see the discourse above for additional information in regard to treatment course up until the patient arrives here in the [place] clinic today. The patient is ready to get started with HBO therapy as soon as possible I do think based on all my review of his records this seems to be appropriate he does have chronic osteomyelitis is also been offered amputation this is obviously a limb salvage operation here. We are trying to prevent him from having to undergo an extensive amputation which would likely be a below-knee amputation which in turn is going to affect his quality of life he wants to try to avoid that if at all possible I think hyperbaric oxygen therapy is his best bet to achieve that goal. Currently he has been on IV Ancef. That actually ends tomorrow. Following that according to notes it appears he supposed to be taking Levaquin and Flagyl for an additional period of time although I am not certain exactly how long that with the. He has been seeing regional physicians infectious disease in [place] and again that so I recommended that he contact today in order to find out what the plan is going forward. His most recent hemoglobin A1c as noted above was 11.5 on 05-07-2022 he tells me trying to work on that he is also quit smoking as of this point. 06-22-2022 upon evaluation today patient appears to be doing well currently in regard to his wound all things considered. He is showing signs of a lot of new granulation tissue and overall very pleased. He is changing this once a day with the Dakin's moistened gauze dressing. Fortunately I do not see any evidence of infection locally or systemically at this time. We have been trying to get him into  the hyperbaric chamber he was having a lot of issues with sinus pressure and we have made referral and called multiple ENT specialist the earliest we have been able to get his August 9 at [facility] ear nose throat. They are going to try to work him in sooner if they have any opportunities to do so. Obviously I think the sooner he can do this the better. We need to get him in hyperbarics as quickly as possible. 7/26;  patient presents for follow-up. He has been using Dakin's wet-to-dry dressings and oil emollient dressing over the exposed bone. He currently denies systemic signs of infection. He states he is scheduled to see infectious disease, Dr Candiss Norse next week. She had ordered an MRI of the left foot and he is scheduled to have this done tomorrow. He currently denies systemic signs of infection Including fever/chills, nausea/vomiting increased warmth or erythema to the wound bed or purulent drainage. He started hyperbaric oxygen treatment however did not tolerate this due to sinus pressure. He is scheduled to see ENT in 2 weeks for evaluation. He is not continuing HBO until he is cleared by ENT. 07-06-2022 patient presents today for follow-up concerning his plantar foot ulcer. The good news is this actually is significantly better compared to previous. Some of the bone which is necrotic is starting to slough off and I am going to perform debridement to clear this away today. Nonetheless I do think that the patient is going to need hyperbarics and we need to try to get it as soon as possible. He still having a lot of sinus pressure I can even hear the congestion today. He is on 3 different antibiotics cefadroxil, metronidazole, and Levaquin currently for the osteomyelitis. For that reason this should actually help with the infection if he does have a chronic sinusitis I am also going to see about getting him on steroids to try to see if this can benefit him as well. He voiced understanding and he is in  agreement with giving that shot. This is probably can to spike his blood sugars which I discussed with him he is going to be seeing his primary care provider later today I want him to let her know as well what is going on and why so that she could be aware but I really do think he needs to take the prednisone. We need to get him in the chamber as soon as possible to try to help with getting this wound to heal and closed so that he does not lose his foot. 07-13-2022 upon evaluation today patient appears to be doing better in regard to his wound he continues to show signs of new granulation am going to perform some debridement today but overall this is doing quite well. 07-20-2022 upon evaluation today patient's wound is actually showing some signs of improvement he does have 1 area on the fifth metatarsal region where there is some necrotic tendon that is noted at this point. Unfortunately this is preventing good granulation and over this point the rest of the metatarsal region is completely covered over with granulation tissue which is great news there is some other slough and biofilm buildup that I will clearway as well. 07-27-2022 upon evaluation today patient appears to be doing well currently in regard to his plantar foot ulcer. He has been tolerating the dressing changes without complication fortunately. I do believe he is making excellent progress and each time I see him this is better and better is pretty much completely covered as far as any bone exposure is concerned and he still is on the IV antibiotics were still also undergoing hyperbarics at this point that he just got started and is doing quite well with since getting the tubes in his ears.Marland Kitchen 08-03-2022 upon evaluation today patient appears to be doing well in general although has been having a lot of drainage compared to normal. It was actually leaking through his dressing today. He tells me he changed it  last night. With that being said he  has been up on his feet a lot more which I think is probably a big part of the issue here. Fortunately I do not see any signs of infection locally or systemically. 08-17-2022 upon evaluation today patient is doing well with regard to his wound this is actually measuring smaller which is good news. I am very pleased in that regard. With that being said he has not been participating HBO therapy as he really needs to afford to see this heal appropriately and completely. In fact he has only made 9 total HBO treatments in the past 4 weeks. Again this is not therapeutic and I discussed that with the patient. Nonetheless he is still improving and since the hyperbarics is not helping based on the discussion today the patient is going to discontinue HBO therapy at this point. He tells me that getting here 5 days a week just is not possible. He has had difficulty getting here and then also when he was here had difficulty with getting here at the right time which has also been part of the problem. Nonetheless either way I am pleased that he is doing better and my hope is that we will still be able to get him healed obviously he should continue with the antibiotics as well as the wound care as he has been doing this seems to be doing excellent for him but everything is improving quite nicely. 08-24-2022 upon evaluation today patient appears to be doing well currently in regard to his wound this is actually measuring significantly better even compared to last week. I am very pleased in that regard I do not see any signs of infection and I think he is making excellent progress here. 08-31-2022 upon evaluation today patient's wound is actually showing signs of significant improvement. I am actually very pleased with where things stand currently. There does not appear to be any evidence of active infection locally or systemically at this time. No fevers, chills, nausea, vomiting, or diarrhea. 09-19-2022 upon evaluation  patient's wound is actually showing signs of improvement though it still having quite a bit of drainage. I do believe he would benefit from switching over to [facility]. I discussed that with him today. He is definitely in agreement with that plan. 09-28-2022 upon evaluation patient's wound is actually showing signs of improvement. With that being said he has been using the [facility] but he has not had enough to really cover the whole wound. In fact he tells me he just got his supplies today that we had ordered last week. Fortunately I do not see any evidence of active infection at this time. 10-05-2022 upon evaluation today patient appears to be doing well currently in regard to his foot wound there is actually much less debridement today to be done compared to previous. Fortunately I see no signs of active infection locally or systemically at this time. 11-02-2022 upon evaluation today patient appears to be doing well currently in regard to his wounds on the foot. Fortunately he seems to be making good progress is not quite as much of an improvement this week as it was last time I saw him but he had a lot going on and tells me that he has been up moving on it more. I do believe he needs to try to get this under control and I do believe staying off of it more will definitely help in that regard. 11-09-2022 upon evaluation today patient appears  to be doing well currently in regard to his wound. He is showing signs of improvement which is great news. Fortunately I do not see any evidence of active infection locally nor systemically at this time. No fever or chills noted 11-16-2022 patient's wound still slowly seems to be making progress. The good news is we have gotten the approval for the Apligraf which is awesome. Working to see about getting that started form hopefully will be able to get to him for the end of the year and then subsequently we can see if we can regain the approval for the  final 3 in the first of the new year. Nonetheless I think getting this started as quickly as possible would be good for the patient currently. [name], [name] ([id number]) [id number] Page 7 of 8 do believe this is going to have a good result as far as helping with the healing and new tissue growth and epithelization in regard to the wound. He still using the offloading with a knee scooter at this point. Objective Constitutional Well-nourished and well-hydrated in no acute distress. Vitals Time Taken: 3:38 PM, Height: 70 in, Weight: 230 lbs, BMI: 33, Temperature: 98.3 F, Pulse: 101 bpm, Respiratory Rate: 18 breaths/min, Blood Pressure: 198/119 mmHg. Respiratory normal breathing without difficulty. Psychiatric this patient is able to make decisions and demonstrates good insight into disease process. Alert and Oriented x 3. pleasant and cooperative. General Notes: Upon inspection patient's wound bed actually showed signs of good granulation epithelization at this point. Fortunately I do not see any evidence of active infection locally nor systemically which is great news and overall I am extremely pleased with where we stand currently. No fevers, chills, nausea, vomiting, or diarrhea. Integumentary (Hair, Skin) Wound #1 status is Open. Original cause of wound was Gradually Appeared. The date acquired was: 12/04/2021. The wound has been in treatment 22 weeks. The wound is located on the [place]. The wound measures 9cm length x 6cm width x 0.3cm depth; 42.412cm^2 area and 12.723cm^3 volume. There is Fat Layer (Subcutaneous Tissue) exposed. There is no tunneling or undermining noted. There is a medium amount of serosanguineous drainage noted. There is medium (34-66%) pink, hyper - granulation within the wound bed. There is a medium (34-66%) amount of necrotic tissue within the wound bed including Adherent Slough. Assessment Active Problems ICD-10 Other chronic  osteomyelitis, left ankle and foot Type 2 diabetes mellitus with foot ulcer Non-pressure chronic ulcer of other part of left foot with necrosis of bone Plan Follow-up Appointments: Return Appointment in 1 week. Bathing/ Shower/ Hygiene: May shower; gently cleanse wound with antibacterial soap, rinse and pat dry prior to dressing wounds Anesthetic (Use 'Patient Medications' Section for Anesthetic Order Entry): Lidocaine applied to wound bed Edema Control - Lymphedema / Segmental Compressive Device / Other: Elevate, Exercise Daily and Avoid Standing for Long Periods of Time. Elevate legs to the level of the heart and pump ankles as often as possible Elevate leg(s) parallel to the floor when sitting. WOUND #1: - Foot Wound Laterality: Plantar, Left Cleanser: [place] Ancillary Kit - 15 Day Supply 1 x Per Day/30 Days Discharge Instructions: Use supplies as instructed; Kit contains: (15) Saline Bullets; (15) 3x3 Gauze; 15 pr Gloves Prim Dressing: Hydrofera Blue Ready Transfer Foam, 4x5 (in/in) (Generic) 1 x Per Day/30 Days ary Discharge Instructions: Apply Hydrofera Blue Ready to wound bed as directed Secondary Dressing: ABD Pad 5x9 (in/in) (Generic) 1 x Per Day/30 Days Discharge Instructions: Cover with ABD pad Secondary Dressing: Kerlix  4.5 x 4.1 (in/yd) (Generic) 1 x Per Day/30 Days Discharge Instructions: Apply Kerlix 4.5 x 4.1 (in/yd) as instructed Secured With: Medipore T - 72M Medipore H Soft Cloth Surgical T ape ape, 2x2 (in/yd) (Generic) 1 x Per Day/30 Days 1. I am going to recommend that we have the patient continue to monitor for any signs of infection and if anything changes he knows contact the office let me know. 2. I am also can recommend that we have the patient continue with the ABD pad to cover followed by roll gauze to secure in place. 3. I am also going to suggest we go ahead and order the Apligraf for him for initiation next week. I do think that we need to get this started  as quickly as possible. We will see patient back for reevaluation in 1 week here in the clinic. If anything worsens or changes patient will contact our office for additional recommendations. [name], [name] ([id number]) [id number] Page 8 of 8 Electronic Signature(s) Signed: 11/16/2022 5:01:35 PM By: Worthy Keeler PA-C Entered By: Worthy Keeler on 11/16/2022 17:01:35 -------------------------------------------------------------------------------- SuperBill Details Patient Name: Date of Service: [name], [name] 11/16/2022 Medical Record Number: [id number] Patient Account Number: [id number] Date of Birth/Sex: Treating RN: 1966-04-03 (56 y.o. [name]) Carlene Coria Primary Care Provider: PA Haig Prophet, NO Other Clinician: Referring Provider: Treating Provider/Extender: Cloretta Ned in Treatment: 22 Diagnosis Coding ICD-10 Codes Code Description [phone] Other chronic osteomyelitis, left ankle and foot E11.621 Type 2 diabetes mellitus with foot ulcer L97.524 Non-pressure chronic ulcer of other part of left foot with necrosis of bone Facility Procedures : CPT4 Code: [id number] Description: 76394 - WOUND CARE VISIT-LEV 2 EST PT Modifier: Quantity: 1 Physician Procedures : CPT4 Code Description Modifier [id number] - WC PHYS LEVEL 3 - EST PT ICD-10 Diagnosis Description M86.672 Other chronic osteomyelitis, left ankle and foot E11.621 Type 2 diabetes mellitus with foot ulcer L97.524 Non-pressure chronic ulcer of other  part of left foot with necrosis of bone Quantity: 1 Electronic Signature(s) Signed: 11/16/2022 5:02:35 PM By: Worthy Keeler PA-C Entered By: Worthy Keeler on 11/16/2022 17:02:34

## 2022-11-16 NOTE — Progress Notes (Addendum)
[name], [name] ([id number]) [id number] Page 1 of 9 Visit Report for 11/16/2022 Arrival Information Details Patient Name: Date of Service: [name] [name], [name] 11/16/2022 3:30 PM Medical Record Number: [id number] Patient Account Number: [id number] Date of Birth/Sex: Treating RN: [name] (56 y.o. [name]) [name] Primary Care [name]: PA [name], NO Other Clinician: Referring [name]: Treating [name]/Extender: Cloretta Ned in Treatment: 28 Visit Information History Since Last Visit Added or deleted any medications: No Patient Arrived: Knee Scooter Any new allergies or adverse reactions: No Arrival Time: 15:34 Had a fall or experienced change in No Accompanied By: self activities of daily living that may affect Transfer Assistance: None risk of falls: Patient Identification Verified: Yes Signs or symptoms of abuse/neglect since last visito No Secondary Verification Process Completed: Yes Hospitalized since last visit: No Patient Requires Transmission-Based Precautions: No Implantable device outside of the clinic excluding No Patient Has Alerts: No cellular tissue based products placed in the center since last visit: Has Dressing in Place as Prescribed: Yes Pain Present Now: No Electronic Signature(s) Signed: 11/18/2022 3:52:05 PM By: Matthew Coria RN Entered By: [name] on 11/16/2022 15:38:23 -------------------------------------------------------------------------------- Clinic Level of Care Assessment Details Patient Name: Date of Service: [name], [name] 11/16/2022 3:30 PM Medical Record Number: [id number] Patient Account Number: [id number] Date of Birth/Sex: Treating RN: [name] (56 y.o. [name]) [name] Primary Care Ayen Viviano: PA [name], NO Other Clinician: Referring [name]: Treating [name]/Extender: Cloretta Ned in Treatment: 22 Clinic Level of Care Assessment Items TOOL 4 Quantity Score X- 1 0 Use  when only an EandM is performed on FOLLOW-UP visit ASSESSMENTS - Nursing Assessment / Reassessment X- 1 10 Reassessment of Co-morbidities (includes updates in patient status) X- 1 5 Reassessment of Adherence to Treatment Plan [name], [name] ([id number]) [id number] Page 2 of 9 ASSESSMENTS - Wound and Skin A ssessment / Reassessment X - Simple Wound Assessment / Reassessment - one wound 1 5 _0  - 0 Complex Wound Assessment / Reassessment - multiple wounds _1  - 0 Dermatologic / Skin Assessment (not related to wound area) ASSESSMENTS - Focused Assessment _2  - 0 Circumferential Edema Measurements - multi extremities _3  - 0 Nutritional Assessment / Counseling / Intervention _4  - 0 Lower Extremity Assessment (monofilament, tuning fork, pulses) _5  - 0 Peripheral Arterial Disease Assessment (using hand held doppler) ASSESSMENTS - Ostomy and/or Continence Assessment and Care _6  - 0 Incontinence Assessment and Management _7  - 0 Ostomy Care Assessment and Management (repouching, etc.) PROCESS - Coordination of Care X - Simple Patient / Family Education for ongoing care 1 15 _8  - 0 Complex (extensive) Patient / Family Education for ongoing care _9  - 0 Staff obtains Programmer, systems, Records, T Results / Process Orders est _10  - 0 Staff telephones HHA, Nursing Homes / Clarify orders / etc _11  - 0 Routine Transfer to another Facility (non-emergent condition) _12  - 0 Routine Hospital Admission (non-emergent condition) _13  - 0 New Admissions / Biomedical engineer / Ordering NPWT Apligraf, etc. , _14  - 0 Emergency Hospital Admission (emergent condition) X- 1 10 Simple Discharge Coordination _15  - 0 Complex (extensive) Discharge Coordination PROCESS - Special Needs _16  - 0 Pediatric / Minor Patient Management _17  - 0 Isolation Patient Management _18  - 0 Hearing / Language / Visual special needs _19  - 0 Assessment of Community assistance (transportation, D/C planning,  etc.) _20  - 0 Additional assistance / Altered mentation _21  - 0 Support Surface(s) Assessment (bed, cushion, seat, etc.) INTERVENTIONS - Wound Cleansing / Measurement X - Simple Wound Cleansing - one  wound 1 5 _0  - 0 Complex Wound Cleansing - multiple wounds X- 1 5 Wound Imaging (photographs - any number of wounds) _1  - 0 Wound Tracing (instead of photographs) X- 1 5 Simple Wound Measurement - one wound _2  - 0 Complex Wound Measurement - multiple wounds INTERVENTIONS - Wound Dressings X - Small Wound Dressing one or multiple wounds 1 10 _3  - 0 Medium Wound Dressing one or multiple wounds _4  - 0 Large Wound Dressing one or multiple wounds <EUMPNTIRWERXVQMG>_8<\/QPYPPJKDTOIZTIWP>_8  - 0 Application of Medications - topical <KDXIPJASNKNLZJQB>_3<\/ALPFXTKWIOXBDZHG>_9  - 0 Application of Medications - injection INTERVENTIONS - Miscellaneous _7  - 0 External ear exam [name], [name] ([id number]) [id number] Page 3 of 9 _8  - 0 Specimen Collection (cultures, biopsies, blood, body fluids, etc.) _9  - 0 Specimen(s) / Culture(s) sent or taken to Lab for analysis _10  - 0 Patient Transfer (multiple staff / Harrel Lemon Lift / Similar devices) _11  - 0 Simple Staple / Suture removal (25 or less) _12  - 0 Complex Staple / Suture removal (26 or more) _13  - 0 Hypo / Hyperglycemic Management (close monitor of Blood Glucose) _14  - 0 Ankle / Brachial Index (ABI) - do not check if billed separately X- 1 5 Vital Signs Has the patient been seen at the hospital within the last three years: Yes Total Score: 75 Level Of Care: New/Established - Level 2 Electronic Signature(s) Signed: 11/18/2022 3:52:05 PM By: Matthew Coria RN Entered By: [name] on 11/16/2022 15:55:46 -------------------------------------------------------------------------------- Encounter Discharge Information Details Patient Name: Date of Service: [name], [name] 11/16/2022 3:30 PM Medical Record Number: [id number] Patient Account Number: [id number] Date of Birth/Sex: Treating RN: [birth year][name]  (56 y.o. [name] Primary Care Gianfranco Araki: PA [name], NO Other Clinician: Referring Cherith Tewell: Treating Cornelius Schuitema/Extender: Cloretta Ned in Treatment: 22 Encounter Discharge Information Items Discharge Condition: Stable Ambulatory Status: Knee Scooter Discharge Destination: Home Transportation: Private Auto Accompanied By: self Schedule Follow-up Appointment: Yes Clinical Summary of Care: Electronic Signature(s) Signed: 11/18/2022 3:52:05 PM By: Matthew Coria RN Entered By: [name] on 11/16/2022 15:57:44 -------------------------------------------------------------------------------- Lower Extremity Assessment Details Patient Name: Date of Service: [name], [name] 11/16/2022 3:30 PM [name] ([id number]) [id number] Page 4 of 9 Medical Record Number: [id number] Patient Account Number: [id number] Date of Birth/Sex: Treating RN: [birth year][name] (56 y.o. [name]) [name] Primary Care Caliya Narine: PA [name], NO Other Clinician: Referring Kemar Pandit: Treating Tuwanda Vokes/Extender: Cloretta Ned in Treatment: 22 Electronic Signature(s) Signed: 11/18/2022 3:52:05 PM By: Matthew Coria RN Entered By: [name] on 11/16/2022 15:47:53 -------------------------------------------------------------------------------- Multi Wound Chart Details Patient Name: Date of Service: [name], [name] 11/16/2022 3:30 PM Medical Record Number: [id number] Patient Account Number: [id number] Date of Birth/Sex: Treating RN: [birth year] (56 y.o. [name]) [name] Primary Care Caralyn Twining: PA [name], NO Other Clinician: Referring Terria Deschepper: Treating Tyrion Glaude/Extender: Cloretta Ned in Treatment: 22 Vital Signs Height(in): 70 Pulse(bpm): 101 Weight(lbs): 230 Blood Pressure(mmHg): 198/119 Body Mass Index(BMI): 33 Temperature(F): 98.3 Respiratory Rate(breaths/min): 18 [1:Photos:] [N/A:N/A] Left, Plantar Foot N/A N/A Wound  Location: Gradually Appeared N/A N/A Wounding Event: Diabetic Wound/Ulcer of the Lower N/A N/A Primary Etiology: Extremity Type II Diabetes N/A N/A Comorbid History: 12/04/2021 N/A N/A Date Acquired: 22 N/A N/A Weeks of Treatment: Open N/A N/A Wound Status: No N/A N/A Wound Recurrence: Yes N/A N/A Pending A mputation on Presentation: 9x6x0.3 N/A N/A Measurements L x W x D (cm) 42.412 N/A N/A A (cm) : rea 12.723 N/A N/A Volume (cm) : 57.80% N/A N/A % Reduction in A rea: 74.70% N/A  N/A % Reduction in Volume: Grade 4 N/A N/A Classification: Medium N/A N/A Exudate A mount: Serosanguineous N/A N/A Exudate Type: red, brown N/A N/A Exudate Color: Medium (34-66%) N/A N/A Granulation A mount: Pink, Hyper-granulation N/A N/A Granulation Quality: Medium (34-66%) N/A N/A Necrotic A mount: Fat Layer (Subcutaneous Tissue): Yes N/A N/A Exposed Structures: Fascia: No Tendon: No Moctezuma, [place] ([id number]) [id number] Page 5 of 9 Muscle: No Joint: No Bone: No None N/A N/A Epithelialization: Treatment Notes Electronic Signature(s) Signed: 11/18/2022 3:52:05 PM By: Matthew Coria RN Entered By: [name] on 11/16/2022 15:47:59 -------------------------------------------------------------------------------- [place] Details Patient Name: Date of Service: [name], [name] 11/16/2022 3:30 PM Medical Record Number: [id number] Patient Account Number: [id number] Date of Birth/Sex: Treating RN: [birth year] (56 y.o. [name]) [name] Primary Care Tristin Vandeusen: PA [name], NO Other Clinician: Referring Evani Shrider: Treating Rochele Lueck/Extender: Cloretta Ned in Treatment: 22 Active Inactive Osteomyelitis Nursing Diagnoses: Infection: osteomyelitis Knowledge deficit related to disease process and management Potential for infection: osteomyelitis Goals: Diagnostic evaluation for osteomyelitis completed as ordered Date  Initiated: 06/28/2022 Date Inactivated: 08/03/2022 Target Resolution Date: 06/28/2022 Goal Status: Met Patient/caregiver will verbalize understanding of disease process and disease management Date Initiated: 06/28/2022 Date Inactivated: 08/31/2022 Target Resolution Date: 06/28/2022 Goal Status: Met Patient's osteomyelitis will resolve Date Initiated: 06/28/2022 Target Resolution Date: 12/17/2022 Goal Status: Active Signs and symptoms for osteomyelitis will be recognized and promptly addressed Date Initiated: 06/28/2022 Date Inactivated: 08/03/2022 Target Resolution Date: 06/28/2022 Goal Status: Met Interventions: Assess for signs and symptoms of osteomyelitis resolution every visit Provide education on osteomyelitis Screen for HBO Notes: Electronic Signature(s) Signed: 11/18/2022 3:52:05 PM By: Matthew Coria RN Entered By: [name] on 11/16/2022 15:57:05 [name], [name] [place] ([id number]) [id number] Page 6 of 9 -------------------------------------------------------------------------------- Pain Assessment Details Patient Name: Date of Service: [name], [name] 11/16/2022 3:30 PM Medical Record Number: [id number] Patient Account Number: [id number] Date of Birth/Sex: Treating RN: [birth year] [name] (56 y.o. [name]) [name] Primary Care Didier Brandenburg: PA [name], NO Other Clinician: Referring Delsa Walder: Treating Kimi Bordeau/Extender: Cloretta Ned in Treatment: 22 Active Problems Location of Pain Severity and Description of Pain Patient Has Paino No Site Locations Pain Management and Medication Current Pain Management: Electronic Signature(s) Signed: 11/18/2022 3:52:05 PM By: Matthew Coria RN Entered By: [name] on 11/16/2022 15:39:07 -------------------------------------------------------------------------------- Patient/Caregiver Education Details Patient Name: Date of Service: [name], [name] 12/14/2023andnbsp3:30 PM Medical Record Number: [id number] Patient  Account Number: [id number] Date of Birth/Gender: Treating RN: [birth year] [name] (56 y.o. [name] Primary Care Physician: PA [name], NO Other Clinician: Referring Physician: Treating Physician/Extender: Cloretta Ned in Treatment: [address], [place] ([id number]) [phone].pdf Page 7 of 9 Education Assessment Education Provided To: Patient Education Topics Provided Infection: Methods: Explain/Verbal Responses: State content correctly Electronic Signature(s) Signed: 11/18/2022 3:52:05 PM By: Matthew Coria RN Entered By: [name] on 11/16/2022 15:56:08 -------------------------------------------------------------------------------- Wound Assessment Details Patient Name: Date of Service: [name], [name] 11/16/2022 3:30 PM Medical Record Number: [id number] Patient Account Number: [id number] Date of Birth/Sex: Treating RN: [birth year] (56 y.o. [name]) [name] Primary Care Joniel Graumann: PA [name], NO Other Clinician: Referring Reilley Valentine: Treating Jadon Harbaugh/Extender: Cloretta Ned in Treatment: 22 Wound Status Wound Number: 1 Primary Etiology: Diabetic Wound/Ulcer of the Lower Extremity Wound Location: Left, Plantar Foot Wound Status: Open Wounding Event: Gradually Appeared Comorbid History: Type II Diabetes Date Acquired: 12/04/2021 Weeks Of Treatment: 22 Clustered Wound: No Pending Amputation On Presentation Photos Wound Measurements Length: (cm) 9 Width: (cm) 6 Depth: (cm) 0.3 Area: (cm) 42.412  Volume: (cm) 12.723 % Reduction in Area: 57.8% % Reduction in Volume: 74.7% Epithelialization: None Tunneling: No Undermining: No Wound Description Classification: Grade 4 Exudate Amount: Medium Exudate Type: Serosanguineous Leighton, Kamonte ([id number]) Exudate Color: red, brown Foul Odor After Cleansing: No Slough/Fibrino Yes [id number] Page 8 of 9 Wound Bed Granulation Amount: Medium (34-66%)  Exposed Structure Granulation Quality: Pink, Hyper-granulation Fascia Exposed: No Necrotic Amount: Medium (34-66%) Fat Layer (Subcutaneous Tissue) Exposed: Yes Necrotic Quality: Adherent Slough Tendon Exposed: No Muscle Exposed: No Joint Exposed: No Bone Exposed: No Treatment Notes Wound #1 (Foot) Wound Laterality: Plantar, Left Cleanser [place] Ancillary Kit - 15 [name] Supply Discharge Instruction: Use supplies as instructed; Kit contains: (15) Saline Bullets; (15) 3x3 Gauze; 15 pr Gloves Peri-Wound Care Topical Primary Dressing Hydrofera Blue Ready Transfer Foam, 4x5 (in/in) Discharge Instruction: Apply Hydrofera Blue Ready to wound bed as directed Secondary Dressing ABD Pad 5x9 (in/in) Discharge Instruction: Cover with ABD pad Kerlix 4.5 x 4.1 (in/yd) Discharge Instruction: Apply Kerlix 4.5 x 4.1 (in/yd) as instructed Secured With Medipore T - 44M Medipore H Soft Cloth Surgical T ape ape, 2x2 (in/yd) Compression Wrap Compression Stockings Add-Ons Electronic Signature(s) Signed: 11/18/2022 3:52:05 PM By: Matthew Coria RN Entered By: [name] on 11/16/2022 15:47:28 -------------------------------------------------------------------------------- Vitals Details Patient Name: Date of Service: [name], [name] 11/16/2022 3:30 PM Medical Record Number: [id number] Patient Account Number: [id number] Date of Birth/Sex: Treating RN: [birth year][name] (56 y.o. [name]) [name] Primary Care Vayda Dungee: PA [name], NO Other Clinician: Referring Maisley Hainsworth: Treating Cyenna Rebello/Extender: Cloretta Ned in Treatment: 22 Vital Signs Time Taken: 15:38 Temperature (F): 98.3 Height (in): 70 Pulse (bpm): 101 Weight (lbs): 230 Respiratory Rate (breaths/min): 18 Body Mass Index (BMI): 33 Blood Pressure (mmHg): 198/119 Reference Range: 80 - 120 mg / dl Lint, Bertha ([id number]) [id number] Page 9 of 9 Electronic Signature(s) Signed: 11/18/2022 3:52:05 PM By:  Matthew Coria RN Entered By: [name] on 11/16/2022 15:38:59

## 2022-11-21 ENCOUNTER — Other Ambulatory Visit: Payer: Self-pay | Admitting: Family Medicine

## 2022-11-21 DIAGNOSIS — E1169 Type 2 diabetes mellitus with other specified complication: Secondary | ICD-10-CM

## 2022-11-21 MED ORDER — INSULIN PEN NEEDLE 32G X 4 MM MISC: 0 refills | Status: DC

## 2022-11-21 NOTE — Telephone Encounter (Signed)
Medication Refill - Medication:  Insulin Pen Needle 32G X 4 MM MISC   Has the patient contacted their pharmacy? No.  Preferred Pharmacy (with phone number or street name):  [id number] - [place], [place] - [address] Phone: [phone]  Fax: [phone]      Has the patient been seen for an appointment in the last year OR does the patient have an upcoming appointment? Yes.    Agent: Please be advised that RX refills may take up to 3 business days. We ask that you follow-up with your pharmacy.

## 2022-11-21 NOTE — Telephone Encounter (Signed)
Future visit in 3 weeks.  Requested Prescriptions  Pending Prescriptions Disp Refills   Insulin Pen Needle 32G X 4 MM MISC 100 each 0    Sig: Use as directed to inject insulin up to 4 times daily.     Endocrinology: Diabetes - Testing Supplies Passed - 11/21/2022 11:47 AM      Passed - Valid encounter within last 12 months    Recent Outpatient Visits           4 weeks ago Type 2 diabetes mellitus with other specified complication, with long-term current use of insulin [facility])   [facility] [place], Jeannett Senior L, RPH-CPP   2 months ago Type 2 diabetes mellitus with other specified complication, with long-term current use of insulin (HCC)   [facility] [place], Odette Horns, MD   4 months ago Type 2 diabetes mellitus with other specified complication, with long-term current use of insulin [facility])   [facility] [place], [place], [place]   5 months ago Type 2 diabetes mellitus with other specified complication, with long-term current use of insulin (HCC)    [facility] [name], MD       Future Appointments             In 3 weeks [name], MD [facility]

## 2022-11-23 ENCOUNTER — Encounter: Payer: 59 | Admitting: Physician Assistant

## 2022-11-23 DIAGNOSIS — E1169 Type 2 diabetes mellitus with other specified complication: Secondary | ICD-10-CM | POA: Diagnosis not present

## 2022-11-23 DIAGNOSIS — L97522 Non-pressure chronic ulcer of other part of left foot with fat layer exposed: Secondary | ICD-10-CM | POA: Diagnosis not present

## 2022-11-23 DIAGNOSIS — M86672 Other chronic osteomyelitis, left ankle and foot: Secondary | ICD-10-CM | POA: Diagnosis not present

## 2022-11-23 DIAGNOSIS — L97524 Non-pressure chronic ulcer of other part of left foot with necrosis of bone: Secondary | ICD-10-CM | POA: Diagnosis not present

## 2022-11-23 DIAGNOSIS — E11621 Type 2 diabetes mellitus with foot ulcer: Secondary | ICD-10-CM | POA: Diagnosis not present

## 2022-11-23 NOTE — Progress Notes (Addendum)
KODEN, LEEDER ([id number]) [id number] Page 1 of 9 Visit Report for 11/23/2022 Chief Complaint Document Details Patient Name: Date of Service: [name] 11/23/2022 10:15 A M Medical Record Number: [id number] Patient Account Number: [id number] Date of Birth/Sex: Treating RN: [birth year] (56 y.o. 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 [name]) Carlene Coria Primary Care Provider: PA Haig Prophet, [place] Other Clinician: Referring Provider: Treating Provider/Extender: Cloretta Ned in Treatment: 23 Cellular or Tissue Based Product Type Wound #1 Left,Plantar Foot Applied to: Performed By: Physician Tommie Sams., PA-C Cellular or Tissue Based Product Type: Apligraf Level of Consciousness (Pre-procedure): Awake and Alert Pre-procedure Verification/Time Out Yes - 10:39 Taken: Location: genitalia / hands / feet / multiple digits Wound Size (sq cm): 41.4 Product Size (sq cm): 44 Waste Size (sq cm): 0 Amount of Product Applied (sq cm): 44 Instrument Used: Blade, Forceps, Scissors Lot #: [id number] Order #: 1 Expiration Date: 11/24/2022 Fenestrated: Yes InstrumentJosejulian [name], [name] ([id number] Page 2 of 9 Reconstituted:  Yes Solution Type: NORMAL SALINE Solution Amount: 3ML Lot #: [id number] Solution Expiration Date: 02/19/2024 Secured: Yes Secured With: Steri-Strips Dressing Applied: Yes Primary Dressing: ADAPTIC Procedural Pain: 0 Post Procedural Pain: 0 Response to Treatment: Procedure was tolerated well Level of Consciousness (Post- Awake and Alert procedure): Post Procedure Diagnosis Same as Pre-procedure Electronic Signature(s) Signed: 11/23/2022 3:44:25 PM By: Carlene Coria RN Entered By: Carlene Coria on 11/23/2022 10:40:48 -------------------------------------------------------------------------------- HPI Details Patient Name: Date of Service: [name] 11/23/2022 10:15 A M Medical Record Number: [id number] Patient Account Number: [id number] Date of Birth/Sex: Treating RN: [birth year] (56 y.o. [name] Primary Care Provider: PA Haig Prophet, [place] Other Clinician: Referring Provider: Treating Provider/Extender: Cloretta Ned in Treatment: 23 History of Present Illness HPI Description: Notes from [facility] under Dr. Glenford Peers care:   ADMISSION6/15/2023This is a 33 year old poorly controlled type II diabetic (last A1c 11.5%). In January of this year, he presented to the hospital with an abscess in his left foot. He also had a foreign body present. It was removed in the ER and he was admitted to the hospital for IV antibiotics. He was subsequently discharged on oral antibiotics. Due to financial concerns, he has not taken his diabetic medications (metformin and 70/30 insulin) and as a result, presented back to the emergency room on June 1 with worsening findings, including frank pus and osteomyelitis on MRI. BKA was recommended, but he declined. He was taken to the operating room by Dr. Sharol Given who performed an extensive debridement. Cultures were taken and he was placed on IV antibiotics. He saw infectious disease while in the hospital who prescribed a 6-week  course of IV antibiotics including cefazolin as well as oral metronidazole and levofloxacin. The infectious disease provider also indicated that he would benefit from below-knee amputation, but the patient desired another opinion and therefore he has been referred to wound care center for further evaluation and management. ABIs obtained while he was in the hospital demonstrate adequate  blood flow. Essentially the entire bottom of the patient's left foot has been removed. The heads of the majority of his metatarsal bones are exposed along with their accompanying tendons. Muscle and fat are exposed as well. The wound surface is fairly dry; the patient reports that he was not given any specific wound care instructions other than to place a dry dressing on it after showering. There is no odor or purulent drainage identified. 05/26/2022: The wound measures a little bit smaller today. There is some good granulation tissue beginning to form on the surface. He still has exposed bone and tendon. There is some slough accumulation. He saw infectious disease earlier this week and was frustrated that they recommended amputation. He is interested in another surgical opinion. He continues to smoke but says that he has cut down. He is using a knee scooter for mobility so that he can stay off of his foot. 06/05/2022: He saw Dr. Sharol Given last week who was supportive of our efforts to try to salvage the foot. I referred him to vascular surgery to see Dr. Carlis Abbott but he has not received an appointment yet. He continues on IV antibiotics. He says that after our conversation last week about him being unable to initiate hyperbarics if he was going to continue to smoke, he has not had a cigarette since. He is now at 4 weeks of conventional management and his insurance was initiated on July 1. Today, the wound continues to show evidence of improvement. There is good granulation tissue forming. The metatarsals are still exposed but  actually have some pink periosteum. There is still some slough and fibrinous exudate present. No odor or purulent drainage. We have just been using Dakin's dressing changes for now. Admission to [facility]: 06-15-2022 upon evaluation today patient presents for initial inspection here in our clinic though he has been seen by Dr. Celine Ahr in [place] up to this point. I did review her notes and records as well they have been using Dakin's moistened gauze dressing which has done a great job at helping to clean out the Barry, [name] ([id number]) [id number] Page 3 of 9 bottom of his foot. Please see the discourse above for additional information in regard to treatment course up until the patient arrives here in the [place] clinic today. The patient is ready to get started with HBO therapy as soon as possible I do think based on all my review of his records this seems to be appropriate he does have chronic osteomyelitis is also been offered amputation this is obviously a limb salvage operation here. We are trying to prevent him from having to undergo an extensive amputation which would likely be a below-knee amputation which in turn is going to affect his quality of life he wants to try to avoid that if at all possible I think hyperbaric oxygen therapy is his best bet to achieve that goal. Currently he has been on IV Ancef. That actually ends tomorrow. Following that according to notes it appears he supposed to be taking Levaquin and Flagyl for an additional period of time although I am not certain exactly how long that with the. He has been seeing regional physicians infectious disease in [place] and again that so I recommended that he contact today in order to find out what the plan is going forward. His most recent hemoglobin A1c as noted above was 11.5 on 05-07-2022 he tells me trying to work on that he is also quit smoking as of  this point. 06-22-2022 upon  evaluation today patient appears to be doing well currently in regard to his wound all things considered. He is showing signs of a lot of new granulation tissue and overall very pleased. He is changing this once a day with the Dakin's moistened gauze dressing. Fortunately I do not see any evidence of infection locally or systemically at this time. We have been trying to get him into the hyperbaric chamber he was having a lot of issues with sinus pressure and we have made referral and called multiple ENT specialist the earliest we have been able to get his August 9 at [facility] ear nose [facility]. They are going to try to work him in sooner if they have any opportunities to do so. Obviously I think the sooner he can do this the better. We need to get him in hyperbarics as quickly as possible. 7/26; patient presents for follow-up. He has been using Dakin's wet-to-dry dressings and oil emollient dressing over the exposed bone. He currently denies systemic signs of infection. He states he is scheduled to see infectious disease, Dr Candiss Norse next week. She had ordered an MRI of the left foot and he is scheduled to have this done tomorrow. He currently denies systemic signs of infection Including fever/chills, nausea/vomiting increased warmth or erythema to the wound bed or purulent drainage. He started hyperbaric oxygen treatment however did not tolerate this due to sinus pressure. He is scheduled to see ENT in 2 weeks for evaluation. He is not continuing HBO until he is cleared by ENT. 07-06-2022 patient presents today for follow-up concerning his plantar foot ulcer. The good news is this actually is significantly better compared to previous. Some of the bone which is necrotic is starting to slough off and I am going to perform debridement to clear this away today. Nonetheless I do think that the patient is going to need hyperbarics and we need to try to get it as soon as possible. He still having a lot of sinus  pressure I can even hear the congestion today. He is on 3 different antibiotics cefadroxil, metronidazole, and Levaquin currently for the osteomyelitis. For that reason this should actually help with the infection if he does have a chronic sinusitis I am also going to see about getting him on steroids to try to see if this can benefit him as well. He voiced understanding and he is in agreement with giving that shot. This is probably can to spike his blood sugars which I discussed with him he is going to be seeing his primary care provider later today I want him to let her know as well what is going on and why so that she could be aware but I really do think he needs to take the prednisone. We need to get him in the chamber as soon as possible to try to help with getting this wound to heal and closed so that he does not lose his foot. 07-13-2022 upon evaluation today patient appears to be doing better in regard to his wound he continues to show signs of new granulation am going to perform some debridement today but overall this is doing quite well. 07-20-2022 upon evaluation today patient's wound is actually showing some signs of improvement he does have 1 area on the fifth metatarsal region where there is some necrotic tendon that is noted at this point. Unfortunately this is preventing good granulation and over this point the rest of the metatarsal region is completely covered  over with granulation tissue which is great news there is some other slough and biofilm buildup that I will clearway as well. 07-27-2022 upon evaluation today patient appears to be doing well currently in regard to his plantar foot ulcer. He has been tolerating the dressing changes without complication fortunately. I do believe he is making excellent progress and each time I see him this is better and better is pretty much completely covered as far as any bone exposure is concerned and he still is on the IV antibiotics were still  also undergoing hyperbarics at this point that he just got started and is doing quite well with since getting the tubes in his ears.Marland Kitchen 08-03-2022 upon evaluation today patient appears to be doing well in general although has been having a lot of drainage compared to normal. It was actually leaking through his dressing today. He tells me he changed it last night. With that being said he has been up on his feet a lot more which I think is probably a big part of the issue here. Fortunately I do not see any signs of infection locally or systemically. 08-17-2022 upon evaluation today patient is doing well with regard to his wound this is actually measuring smaller which is good news. I am very pleased in that regard. With that being said he has not been participating HBO therapy as he really needs to afford to see this heal appropriately and completely. In fact he has only made 9 total HBO treatments in the past 4 weeks. Again this is not therapeutic and I discussed that with the patient. Nonetheless he is still improving and since the hyperbarics is not helping based on the discussion today the patient is going to discontinue HBO therapy at this point. He tells me that getting here 5 days a week just is not possible. He has had difficulty getting here and then also when he was here had difficulty with getting here at the right time which has also been part of the problem. Nonetheless either way I am pleased that he is doing better and my hope is that we will still be able to get him healed obviously he should continue with the antibiotics as well as the wound care as he has been doing this seems to be doing excellent for him but everything is improving quite nicely. 08-24-2022 upon evaluation today patient appears to be doing well currently in regard to his wound this is actually measuring significantly better even compared to last week. I am very pleased in that regard I do not see any signs of infection and  I think he is making excellent progress here. 08-31-2022 upon evaluation today patient's wound is actually showing signs of significant improvement. I am actually very pleased with where things stand currently. There does not appear to be any evidence of active infection locally or systemically at this time. No fevers, chills, nausea, vomiting, or diarrhea. 09-19-2022 upon evaluation patient's wound is actually showing signs of improvement though it still having quite a bit of drainage. I do believe he would benefit from switching over to [facility]. I discussed that with him today. He is definitely in agreement with that plan. 09-28-2022 upon evaluation patient's wound is actually showing signs of improvement. With that being said he has been using the [facility] but he has not had enough to really cover the whole wound. In fact he tells me he just got his supplies today that we had ordered last week. Fortunately  I do not see any evidence of active infection at this time. 10-05-2022 upon evaluation today patient appears to be doing well currently in regard to his foot wound there is actually much less debridement today to be done compared to previous. Fortunately I see no signs of active infection locally or systemically at this time. 11-02-2022 upon evaluation today patient appears to be doing well currently in regard to his wounds on the foot. Fortunately he seems to be making good progress is not quite as much of an improvement this week as it was last time I saw him but he had a lot going on and tells me that he has been up moving on it more. I do believe he needs to try to get this under control and I do believe staying off of it more will definitely help in that regard. 11-09-2022 upon evaluation today patient appears to be doing well currently in regard to his wound. He is showing signs of improvement which is great news. Fortunately I do not see any evidence of active infection locally  nor systemically at this time. No fever or chills noted 11-16-2022 patient's wound still slowly seems to be making progress. The good news is we have gotten the approval for the Apligraf which is awesome. Working to see about getting that started form hopefully will be able to get to him for the end of the year and then subsequently we can see if we can regain the approval for the final 3 in the first of the new year. Nonetheless I think getting this started as quickly as possible would be good for the patient currently. I do believe this is going to have a good result as far as helping with the healing and new tissue growth and epithelization in regard to the wound. He still using the offloading with a knee scooter at this point. 11-23-2022 upon evaluation today patient appears to be doing well currently in regard to his wound he does have a lot of drainage but fortunately no signs of infection at this point. Fortunately there does not appear to be any signs of active infection locally nor systemically at this time which is great news and overall I am extremely pleased with regard to where we stand. No fevers, chills, nausea, vomiting, or diarrhea. Matthew, [name] ([id number]) [id number] Page 4 of 9 Electronic Signature(s) Signed: 11/23/2022 10:52:10 AM By: Worthy Keeler PA-C Entered By: Worthy Keeler on 11/23/2022 10:52:10 -------------------------------------------------------------------------------- Physical Exam Details Patient Name: Date of Service: DUVALL, GLISAN 11/23/2022 10:15 A M Medical Record Number: [id number] Patient Account Number: [id number] Date of Birth/Sex: Treating RN: January 03, 1966 (56 y.o. [name] Primary Care Provider: PA Haig Prophet, [place] Other Clinician: Referring Provider: Treating Provider/Extender: Cloretta Ned in Treatment: 17 Constitutional Well-nourished and well-hydrated in no acute distress. Respiratory normal  breathing without difficulty. Psychiatric this patient is able to make decisions and demonstrates good insight into disease process. Alert and Oriented x 3. pleasant and cooperative. Notes Upon inspection patient's wound bed actually did not require any sharp debridement actually appears to be doing quite well I am very pleased in that regard. I do believe that we are in a good state of mind here to go ahead and proceed with the Apligraf. The wound seems to be doing well and overall I think that he should tolerate this without complication the only issues can be the drainage but needs to make sure to change this every single day I  think we will be able to get that under control without a problem I am going to cut some slits in the Mepitel. Electronic Signature(s) Signed: 11/23/2022 10:52:38 AM By: Worthy Keeler PA-C Entered By: Worthy Keeler on 11/23/2022 10:52:37 -------------------------------------------------------------------------------- Physician Orders Details Patient Name: Date of Service: Matthew COI, [name] 11/23/2022 10:15 A M Medical Record Number: [id number] Patient Account Number: [id number] Date of Birth/Sex: Treating RN: 04-29-1966 (56 y.o. [name] Primary Care Provider: PA Haig Prophet, [place] Other Clinician: Referring Provider: Treating Provider/Extender: Cloretta Ned in Treatment: 23 Verbal / Phone Orders: No Diagnosis Coding ICD-10 Coding Code Description [phone] Other chronic osteomyelitis, left ankle and foot CORBEIL, Ernesto ([id number]) [phone].pdf Page 5 of 9 E11.621 Type 2 diabetes mellitus with foot ulcer L97.524 Non-pressure chronic ulcer of other part of left foot with necrosis of bone Follow-up Appointments Return Appointment in 1 week. Anesthetic (Use 'Patient Medications' Section for Anesthetic Order Entry) Lidocaine applied to wound bed Cellular or Tissue Based Products Cellular or Tissue Based Product Type: -  apligraf Cellular or Tissue Based Product applied to wound bed; including contact layer, fixation with steri-strips, dry gauze and cover dressing. (DO NOT REMOVE). Edema Control - Lymphedema / Segmental Compressive Device / Other Elevate, Exercise Daily and A void Standing for Long Periods of Time. Elevate legs to the level of the heart and pump ankles as often as possible Elevate leg(s) parallel to the floor when sitting. Wound Treatment Wound #1 - Foot Wound Laterality: Plantar, Left Secondary Dressing: ABD Pad 5x9 (in/in) (Generic) 1 x Per Day/30 Days Discharge Instructions: Cover with ABD pad Secondary Dressing: Kerlix 4.5 x 4.1 (in/yd) (Generic) 1 x Per Day/30 Days Discharge Instructions: Apply Kerlix 4.5 x 4.1 (in/yd) as instructed Secured With: Medipore T - 47M Medipore H Soft Cloth Surgical T ape ape, 2x2 (in/yd) (Generic) 1 x Per Day/30 Days Electronic Signature(s) Signed: 11/23/2022 3:44:25 PM By: Carlene Coria RN Signed: 11/23/2022 4:23:52 PM By: Worthy Keeler PA-C Entered By: Carlene Coria on 11/23/2022 10:42:01 -------------------------------------------------------------------------------- Problem List Details Patient Name: Date of Service: Matthew TRESTEN, [name] 11/23/2022 10:15 A M Medical Record Number: [id number] Patient Account Number: [id number] Date of Birth/Sex: Treating RN: [birth year] (56 y.o. [name] Primary Care Provider: PA Haig Prophet, [place] Other Clinician: Referring Provider: Treating Provider/Extender: Cloretta Ned in Treatment: 23 Active Problems ICD-10 Encounter Code Description Active Date MDM Diagnosis [phone] Other chronic osteomyelitis, left ankle and foot 06/15/2022 No Yes E11.621 Type 2 diabetes mellitus with foot ulcer 06/15/2022 No Yes L97.524 Non-pressure chronic ulcer of other part of left foot with necrosis of bone 06/15/2022 No Yes Matthew, [name] ([id number]) [id number] Page 6 of 9 Inactive  Problems Resolved Problems Electronic Signature(s) Signed: 11/23/2022 10:22:29 AM By: Worthy Keeler PA-C Entered By: Worthy Keeler on 11/23/2022 10:22:29 -------------------------------------------------------------------------------- Progress Note Details Patient Name: Date of Service: Matthew DEVRY, [name] 11/23/2022 10:15 A M Medical Record Number: [id number] Patient Account Number: [id number] Date of Birth/Sex: Treating RN: December 06, 1965 (56 y.o. 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 Glenford Peers care:   ADMISSION6/15/2023This is a 31 year old poorly controlled type II diabetic (last A1c 11.5%). In January of this year, he presented to the hospital with an abscess in his  left foot. He also had a foreign body present. It was removed in the ER and he was admitted to the hospital for IV antibiotics. He was subsequently discharged on oral antibiotics. Due to financial concerns, he has not taken his diabetic medications (metformin and 70/30 insulin) and as a result, presented back to the emergency room on June 1 with worsening findings, including frank pus and osteomyelitis on MRI. BKA was recommended, but he declined. He was taken to the operating room by Dr. Sharol Given who performed an extensive debridement. Cultures were taken and he was placed on IV antibiotics. He saw infectious disease while in the hospital who prescribed a 6-week course of IV antibiotics including cefazolin as well as oral metronidazole and levofloxacin. The infectious disease provider also indicated that he would benefit from below-knee amputation, but the patient desired another opinion and  therefore he has been referred to wound care center for further evaluation and management. ABIs obtained while he was in the hospital demonstrate adequate blood flow. Essentially the entire bottom of the patient's left foot has been removed. The heads of the majority of his metatarsal bones are exposed along with their accompanying tendons. Muscle and fat are exposed as well. The wound surface is fairly dry; the patient reports that he was not given any specific wound care instructions other than to place a dry dressing on it after showering. There is no odor or purulent drainage identified. 05/26/2022: The wound measures a little bit smaller today. There is some good granulation tissue beginning to form on the surface. He still has exposed bone and tendon. There is some slough accumulation. He saw infectious disease earlier this week and was frustrated that they recommended amputation. He is interested in another surgical opinion. He continues to smoke but says that he has cut down. He is using a knee scooter for mobility so that he can stay off of his foot. 06/05/2022: He saw Dr. Sharol Given last week who was supportive of our efforts to try to salvage the foot. I referred him to vascular surgery to see Dr. Carlis Abbott but he has not received an appointment yet. He continues on IV antibiotics. He says that after our conversation last week about him being unable to initiate hyperbarics if he was going to continue to smoke, he has not had a cigarette since. He is now at 4 weeks of conventional management and his insurance was initiated on July 1. Today, the wound continues to show evidence of improvement. There is good granulation tissue forming. The metatarsals are still exposed but actually have some pink periosteum. There is still some slough and fibrinous exudate present. No odor or purulent drainage. We have just been using Dakin's dressing changes for now. Admission to [place] at [facility]: 06-15-2022 upon  evaluation today patient presents for initial inspection here in our clinic though he has been seen by Dr. Celine Ahr in [place] up to this point. I did review her notes and records as well they have been using Dakin's moistened gauze dressing which has done a great job at helping to clean out the bottom of his foot. Please see the discourse above for additional information in regard to treatment course up until the patient arrives here in the [place] clinic today. The patient is ready to get started with HBO therapy as soon as possible I do think based on all my review of his records this seems to be appropriate he does have chronic osteomyelitis is also been offered amputation this  is obviously a limb salvage operation here. We are trying to prevent him from having to undergo an extensive amputation which would likely be a below-knee amputation which in turn is going to affect his quality of life he wants to try to avoid that if at all possible I think hyperbaric oxygen therapy is his best bet to achieve that goal. Currently he has been on IV Ancef. That actually ends tomorrow. Following that according to notes it appears he supposed to be taking Levaquin and Flagyl for an additional period of time although I am not certain exactly how long that with the. He has been seeing regional physicians infectious disease in [place] and again that so I recommended that he contact today in order to find out what the plan is going forward. His most recent hemoglobin A1c as noted above was 11.5 on 05-07-2022 he tells me trying to work on that he is also quit smoking as of this point. Matthew, [name] ([id number]) [id number] Page 7 of 9 06-22-2022 upon evaluation today patient appears to be doing well currently in regard to his wound all things considered. He is showing signs of a lot of new granulation tissue and overall very pleased. He is changing this once a day with the Dakin's  moistened gauze dressing. Fortunately I do not see any evidence of infection locally or systemically at this time. We have been trying to get him into the hyperbaric chamber he was having a lot of issues with sinus pressure and we have made referral and called multiple ENT specialist the earliest we have been able to get his August 9 at [facility] ear nose throat. They are going to try to work him in sooner if they have any opportunities to do so. Obviously I think the sooner he can do this the better. We need to get him in hyperbarics as quickly as possible. 7/26; patient presents for follow-up. He has been using Dakin's wet-to-dry dressings and oil emollient dressing over the exposed bone. He currently denies systemic signs of infection. He states he is scheduled to see infectious disease, Dr Candiss Norse next week. She had ordered an MRI of the left foot and he is scheduled to have this done tomorrow. He currently denies systemic signs of infection Including fever/chills, nausea/vomiting increased warmth or erythema to the wound bed or purulent drainage. He started hyperbaric oxygen treatment however did not tolerate this due to sinus pressure. He is scheduled to see ENT in 2 weeks for evaluation. He is not continuing HBO until he is cleared by ENT. 07-06-2022 patient presents today for follow-up concerning his plantar foot ulcer. The good news is this actually is significantly better compared to previous. Some of the bone which is necrotic is starting to slough off and I am going to perform debridement to clear this away today. Nonetheless I do think that the patient is going to need hyperbarics and we need to try to get it as soon as possible. He still having a lot of sinus pressure I can even hear the congestion today. He is on 3 different antibiotics cefadroxil, metronidazole, and Levaquin currently for the osteomyelitis. For that reason this should actually help with the infection if he does have a  chronic sinusitis I am also going to see about getting him on steroids to try to see if this can benefit him as well. He voiced understanding and he is in agreement with giving that shot. This is probably can to spike his blood sugars which  I discussed with him he is going to be seeing his primary care provider later today I want him to let her know as well what is going on and why so that she could be aware but I really do think he needs to take the prednisone. We need to get him in the chamber as soon as possible to try to help with getting this wound to heal and closed so that he does not lose his foot. 07-13-2022 upon evaluation today patient appears to be doing better in regard to his wound he continues to show signs of new granulation am going to perform some debridement today but overall this is doing quite well. 07-20-2022 upon evaluation today patient's wound is actually showing some signs of improvement he does have 1 area on the fifth metatarsal region where there is some necrotic tendon that is noted at this point. Unfortunately this is preventing good granulation and over this point the rest of the metatarsal region is completely covered over with granulation tissue which is great news there is some other slough and biofilm buildup that I will clearway as well. 07-27-2022 upon evaluation today patient appears to be doing well currently in regard to his plantar foot ulcer. He has been tolerating the dressing changes without complication fortunately. I do believe he is making excellent progress and each time I see him this is better and better is pretty much completely covered as far as any bone exposure is concerned and he still is on the IV antibiotics were still also undergoing hyperbarics at this point that he just got started and is doing quite well with since getting the tubes in his ears.Marland Kitchen 08-03-2022 upon evaluation today patient appears to be doing well in general although has been  having a lot of drainage compared to normal. It was actually leaking through his dressing today. He tells me he changed it last night. With that being said he has been up on his feet a lot more which I think is probably a big part of the issue here. Fortunately I do not see any signs of infection locally or systemically. 08-17-2022 upon evaluation today patient is doing well with regard to his wound this is actually measuring smaller which is good news. I am very pleased in that regard. With that being said he has not been participating HBO therapy as he really needs to afford to see this heal appropriately and completely. In fact he has only made 9 total HBO treatments in the past 4 weeks. Again this is not therapeutic and I discussed that with the patient. Nonetheless he is still improving and since the hyperbarics is not helping based on the discussion today the patient is going to discontinue HBO therapy at this point. He tells me that getting here 5 days a week just is not possible. He has had difficulty getting here and then also when he was here had difficulty with getting here at the right time which has also been part of the problem. Nonetheless either way I am pleased that he is doing better and my hope is that we will still be able to get him healed obviously he should continue with the antibiotics as well as the wound care as he has been doing this seems to be doing excellent for him but everything is improving quite nicely. 08-24-2022 upon evaluation today patient appears to be doing well currently in regard to his wound this is actually measuring significantly better even compared to last  week. I am very pleased in that regard I do not see any signs of infection and I think he is making excellent progress here. 08-31-2022 upon evaluation today patient's wound is actually showing signs of significant improvement. I am actually very pleased with where things stand currently. There does not  appear to be any evidence of active infection locally or systemically at this time. No fevers, chills, nausea, vomiting, or diarrhea. 09-19-2022 upon evaluation patient's wound is actually showing signs of improvement though it still having quite a bit of drainage. I do believe he would benefit from switching over to [facility]. I discussed that with him today. He is definitely in agreement with that plan. 09-28-2022 upon evaluation patient's wound is actually showing signs of improvement. With that being said he has been using the [facility] but he has not had enough to really cover the whole wound. In fact he tells me he just got his supplies today that we had ordered last week. Fortunately I do not see any evidence of active infection at this time. 10-05-2022 upon evaluation today patient appears to be doing well currently in regard to his foot wound there is actually much less debridement today to be done compared to previous. Fortunately I see no signs of active infection locally or systemically at this time. 11-02-2022 upon evaluation today patient appears to be doing well currently in regard to his wounds on the foot. Fortunately he seems to be making good progress is not quite as much of an improvement this week as it was last time I saw him but he had a lot going on and tells me that he has been up moving on it more. I do believe he needs to try to get this under control and I do believe staying off of it more will definitely help in that regard. 11-09-2022 upon evaluation today patient appears to be doing well currently in regard to his wound. He is showing signs of improvement which is great news. Fortunately I do not see any evidence of active infection locally nor systemically at this time. No fever or chills noted 11-16-2022 patient's wound still slowly seems to be making progress. The good news is we have gotten the approval for the Apligraf which is awesome. Working to see about  getting that started form hopefully will be able to get to him for the end of the year and then subsequently we can see if we can regain the approval for the final 3 in the first of the new year. Nonetheless I think getting this started as quickly as possible would be good for the patient currently. I do believe this is going to have a good result as far as helping with the healing and new tissue growth and epithelization in regard to the wound. He still using the offloading with a knee scooter at this point. 11-23-2022 upon evaluation today patient appears to be doing well currently in regard to his wound he does have a lot of drainage but fortunately no signs of infection at this point. Fortunately there does not appear to be any signs of active infection locally nor systemically at this time which is great news and overall I am extremely pleased with regard to where we stand. No fevers, chills, nausea, vomiting, or diarrhea. Matthew, [name] ([id number]) [id number] Page 8 of 9 Constitutional Well-nourished and well-hydrated in no acute distress. Vitals Time Taken: 10:23 AM, Height: 70 in, Weight: 230 lbs, BMI: 33, Temperature: 98.1 F,  Pulse: 90 bpm, Respiratory Rate: 18 breaths/min, Blood Pressure: 169/100 mmHg. Respiratory normal breathing without difficulty. Psychiatric this patient is able to make decisions and demonstrates good insight into disease process. Alert and Oriented x 3. pleasant and cooperative. General Notes: Upon inspection patient's wound bed actually did not require any sharp debridement actually appears to be doing quite well I am very pleased in that regard. I do believe that we are in a good state of mind here to go ahead and proceed with the Apligraf. The wound seems to be doing well and overall I think that he should tolerate this without complication the only issues can be the drainage but needs to make sure to change this every single  day I think we will be able to get that under control without a problem I am going to cut some slits in the Mepitel. Integumentary (Hair, Skin) Wound #1 status is Open. Original cause of wound was Gradually Appeared. The date acquired was: 12/04/2021. The wound has been in treatment 23 weeks. The wound is located on the [place]. The wound measures 6.9cm length x 6cm width x 0.3cm depth; 32.515cm^2 area and 9.755cm^3 volume. There is Fat Layer (Subcutaneous Tissue) exposed. There is no tunneling or undermining noted. There is a medium amount of serosanguineous drainage noted. There is medium (34-66%) pink, hyper - granulation within the wound bed. There is a medium (34-66%) amount of necrotic tissue within the wound bed including Adherent Slough. 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 A skin graft procedure using a bioengineered skin substitute/cellular or tissue based product was performed by Tommie Sams., PA-C with the following instrument(s): Blade, Forceps, and Scissors. Apligraf was applied and secured with Steri-Strips. 44 sq cm of product was utilized and 0 sq cm was wasted. Post Application, ADAPTIC was applied. A Time Out was conducted at 10:39, prior to the start of the procedure. The procedure was tolerated well with a pain level of 0 throughout and a pain level of 0 following the procedure. Post procedure Diagnosis Wound #1: Same as Pre-Procedure . Plan Follow-up Appointments: Return Appointment in 1 week. Anesthetic (Use 'Patient Medications' Section for Anesthetic Order Entry): Lidocaine applied to wound bed Cellular or Tissue Based Products: Cellular or Tissue Based  Product Type: - apligraf Cellular or Tissue Based Product applied to wound bed; including contact layer, fixation with steri-strips, dry gauze and cover dressing. (DO NOT REMOVE). Edema Control - Lymphedema / Segmental Compressive Device / Other: Elevate, Exercise Daily and Avoid Standing for Long Periods of Time. Elevate legs to the level of the heart and pump ankles as often as possible Elevate leg(s) parallel to the floor when sitting. WOUND #1: - Foot Wound Laterality: Plantar, Left Secondary Dressing: ABD Pad 5x9 (in/in) (Generic) 1 x Per Day/30 Days Discharge Instructions: Cover with ABD pad Secondary Dressing: Kerlix 4.5 x 4.1 (in/yd) (Generic) 1 x Per Day/30 Days Discharge Instructions: Apply Kerlix 4.5 x 4.1 (in/yd) as instructed Secured With: Medipore T - 65M Medipore H Soft Cloth Surgical T ape ape, 2x2 (in/yd) (Generic) 1 x Per Day/30 Days 1. I am good recommend that we have the patient continue to monitor for any signs of infection or worsening. Obviously based on what we  are seeing I do think that he is doing quite well his blood sugars are under control and again he does have a lot of drainage but is only change this daily I think will be fine especially with cutting slits in the Mepitel. I did do that today he seems to be doing well otherwise. 2. I am good recommend as well that he continue with ABD pad followed by roll gauze to secure in place. We will see patient back for reevaluation in 1 week here in the clinic. If anything worsens or changes patient will contact our office for additional recommendations. Matthew, [name] ([id number]) [id number] Page 9 of 9 Electronic Signature(s) Signed: 11/23/2022 10:53:11 AM By: Worthy Keeler PA-C Entered By: Worthy Keeler on 11/23/2022 10:53:11 -------------------------------------------------------------------------------- SuperBill Details Patient Name: Date of Service: Matthew TRERON, [name] 11/23/2022 Medical  Record Number: [id number] Patient Account Number: [id number] Date of Birth/Sex: Treating RN: [birth year] (56 y.o. [name]) Carlene Coria Primary Care Provider: PA Haig Prophet, NO Other Clinician: Referring Provider: Treating Provider/Extender: Cloretta Ned in Treatment: 23 Diagnosis Coding ICD-10 Codes Code Description [phone] Other chronic osteomyelitis, left ankle and foot E11.621 Type 2 diabetes mellitus with foot ulcer L97.524 Non-pressure chronic ulcer of other part of left foot with necrosis of bone Facility Procedures : CPT4 Code: [id number] Description: [phone] (Facility Use Only) Apligraf 44 SQ CM Modifier: Quantity: 44 : CPT4 Code: [id number] Description: [id number] - SKIN SUB GRAFT FACE/NK/HF/G ICD-10 Diagnosis Description L97.524 Non-pressure chronic ulcer of other part of left foot with necrosis of bone Modifier: Quantity: 1 : CPT4 Code: [id number] Description: [id number] - SKIN SUB GRAFT F/N/HF/G ADDL ICD-10 Diagnosis Description L97.524 Non-pressure chronic ulcer of other part of left foot with necrosis of bone Modifier: Quantity: 1 Physician Procedures : CPT4 Code Description Modifier [id number] - WC PHYS SKIN SUB GRAFT FACE/NK/HF/G ICD-10 Diagnosis Description L97.524 Non-pressure chronic ulcer of other part of left foot with necrosis of bone Quantity: 1 : [id number] - WC PHYS SKN SUB GRAFT F/N/HF/G ADDL ICD-10 Diagnosis Description L97.524 Non-pressure chronic ulcer of other part of left foot with necrosis of bone Quantity: 1 Electronic Signature(s) Signed: 11/23/2022 11:00:17 AM By: Worthy Keeler PA-C Previous Signature: 11/23/2022 11:00:03 AM Version By: Worthy Keeler PA-C Entered By: Worthy Keeler on 11/23/2022 11:00:17

## 2022-11-23 NOTE — Progress Notes (Addendum)
[name] ([id number]) [id number] Page 1 of 8 Visit Report for 11/23/2022 Arrival Information Details Patient Name: Date of Service: Matthew [name], [name] 11/23/2022 10:15 A M Medical Record Number: [id number] Patient Account Number: [id number] Date of Birth/Sex: Treating RN: 01/13/1966 (56 y.o. [name]) Carlene Coria Primary Care [name]: PA Haig Prophet, [place] Other Clinician: Referring [name]: Treating [name]/Extender: Cloretta Ned in Treatment: 23 Visit Information History Since Last Visit Added or deleted any medications: No Patient Arrived: Ambulatory Any new allergies or adverse reactions: No Arrival Time: 10:22 Had a fall or experienced change in No Accompanied By: self activities of daily living that may affect Transfer Assistance: None risk of falls: Patient Identification Verified: Yes Signs or symptoms of abuse/neglect since last visito No Secondary Verification Process Completed: Yes Hospitalized since last visit: No Patient Requires Transmission-Based Precautions: No Implantable device outside of the clinic excluding No Patient Has Alerts: No cellular tissue based products placed in the center since last visit: Has Dressing in Place as Prescribed: Yes Has Compression in Place as Prescribed: Yes Pain Present Now: No Electronic Signature(s) Signed: 11/23/2022 3:44:25 PM By: Carlene Coria RN Entered By: Carlene Coria on 11/23/2022 10:23:08 -------------------------------------------------------------------------------- Clinic Level of Care Assessment Details Patient Name: Date of Service: [name], [name] 11/23/2022 10:15 A M Medical Record Number: [id number] Patient Account Number: [id number] Date of Birth/Sex: Treating RN: June 01, 1966 (56 y.o. [name]) Carlene Coria Primary Care [name]: PA Haig Prophet, [place] Other Clinician: Referring [name]: Treating [name]/Extender: Cloretta Ned in Treatment: 23 Clinic Level of Care  Assessment Items TOOL 1 Quantity Score _0  - 0 Use when EandM and Procedure is performed on INITIAL visit ASSESSMENTS - Nursing Assessment / Reassessment _1  - 0 General Physical Exam (combine w/ comprehensive assessment (listed just below) when performed on new pt. evals) _2  - 0 Comprehensive Assessment (HX, ROS, Risk Assessments, Wounds Hx, etc.) [name], [name] ([id number]) [id number] Page 2 of 8 ASSESSMENTS - Wound and Skin Assessment / Reassessment _3  - 0 Dermatologic / Skin Assessment (not related to wound area) ASSESSMENTS - Ostomy and/or Continence Assessment and Care _4  - 0 Incontinence Assessment and Management _5  - 0 Ostomy Care Assessment and Management (repouching, etc.) PROCESS - Coordination of Care _6  - 0 Simple Patient / Family Education for ongoing care _7  - 0 Complex (extensive) Patient / Family Education for ongoing care _8  - 0 Staff obtains Programmer, systems, Records, T Results / Process Orders est _9  - 0 Staff telephones HHA, Nursing Homes / Clarify orders / etc _10  - 0 Routine Transfer to another Facility (non-emergent condition) _11  - 0 Routine Hospital Admission (non-emergent condition) _12  - 0 New Admissions / Biomedical engineer / Ordering NPWT Apligraf, etc. , _13  - 0 Emergency Hospital Admission (emergent condition) PROCESS - Special Needs _14  - 0 Pediatric / Minor Patient Management _15  - 0 Isolation Patient Management _16  - 0 Hearing / Language / Visual special needs _17  - 0 Assessment of Community assistance (transportation, D/C planning, etc.) _18  - 0 Additional assistance / Altered mentation _19  - 0 Support Surface(s) Assessment (bed, cushion, seat, etc.) INTERVENTIONS - Miscellaneous _20  - 0 External ear exam _21  - 0 Patient Transfer (multiple staff / Civil Service fast streamer / Similar devices) _22  - 0 Simple Staple / Suture removal (25 or less) _23  - 0 Complex Staple / Suture removal (26 or more) _24  - 0 Hypo/Hyperglycemic Management  (do not check if billed separately) _25  - 0 Ankle / Brachial Index (ABI) - do not check if billed separately Has the patient been seen  at the hospital within the last three years: Yes Total Score: 0 Level Of Care: ____ Electronic Signature(s) Signed: 11/23/2022 3:44:25 PM By: Carlene Coria RN Entered By: Carlene Coria on 11/23/2022 10:42:21 -------------------------------------------------------------------------------- Encounter Discharge Information Details Patient Name: Date of Service: [name], [name] 11/23/2022 10:15 A M Medical Record Number: [id number] Patient Account Number: [id number] Date of Birth/Sex: Treating RN: 01/10/1966 (56 y.o. [name] Primary Care Afia [name]: PA Haig Prophet, [place] Other Clinician: Referring [name]: Treating [name]/Extender: Raynaldo Opitz [name], [place] ([id number]) [phone].pdf Page 3 of 8 Weeks in Treatment: 23 Encounter Discharge Information Items Post Procedure Vitals Discharge Condition: Stable Temperature (F): 98.1 Ambulatory Status: Knee Scooter Pulse (bpm): 90 Discharge Destination: Home Respiratory Rate (breaths/min): 18 Transportation: Private Auto Blood Pressure (mmHg): 169/100 Accompanied By: self Schedule Follow-up Appointment: Yes Clinical Summary of Care: Electronic Signature(s) Signed: 11/23/2022 3:44:25 PM By: Carlene Coria RN Entered By: Carlene Coria on 11/23/2022 10:44:03 -------------------------------------------------------------------------------- Lower Extremity Assessment Details Patient Name: Date of Service: [name], [name] 11/23/2022 10:15 A M Medical Record Number: [id number] Patient Account Number: [id number] Date of Birth/Sex: Treating RN: 03-Apr-1966 (56 y.o. [name]) Carlene Coria Primary Care Sheniah Supak: PA Haig Prophet, [place] Other Clinician: Referring Merlene Dante: Treating Lavetta Geier/Extender: Raynaldo Opitz Weeks in Treatment: 23 Vascular Assessment Pulses: Dorsalis Pedis Palpable:  [Left:Yes] Electronic Signature(s) Signed: 11/23/2022 3:44:25 PM By: Carlene Coria RN Entered By: Carlene Coria on 11/23/2022 10:28:40 -------------------------------------------------------------------------------- Multi Wound Chart Details Patient Name: Date of Service: [name], [name] 11/23/2022 10:15 A M Medical Record Number: [id number] Patient Account Number: [id number] Date of Birth/Sex: Treating RN: [birth year] (56 y.o. [name] Primary Care Sanayah Munro: PA Haig Prophet, [place] Other Clinician: Referring Leeanna Slaby: Treating Ronte Parker/Extender: Cloretta Ned in Treatment: 23 Vital Signs Height(in): 70 Pulse(bpm): [place], [place] ([id number]) [id number] Page 4 of 8 Weight(lbs): 230 Blood Pressure(mmHg): 169/100 Body Mass Index(BMI): 33 Temperature(F): 98.1 Respiratory Rate(breaths/min): 18 [1:Photos:] [N/A:N/A] Left, Plantar Foot N/A N/A Wound Location: Gradually Appeared N/A N/A Wounding Event: Diabetic Wound/Ulcer of the Lower N/A N/A Primary Etiology: Extremity Type II Diabetes N/A N/A Comorbid History: 12/04/2021 N/A N/A Date Acquired: 45 N/A N/A Weeks of Treatment: Open N/A N/A Wound Status: No N/A N/A Wound Recurrence: Yes N/A N/A Pending A mputation on Presentation: 6.9x6x0.3 N/A N/A Measurements L x W x D (cm) 32.515 N/A N/A A (cm) : rea 9.755 N/A N/A Volume (cm) : 67.70% N/A N/A % Reduction in A rea: 80.60% N/A N/A % Reduction in Volume: Grade 4 N/A N/A Classification: Medium N/A N/A Exudate A mount: Serosanguineous N/A N/A Exudate Type: red, brown N/A N/A Exudate Color: Medium (34-66%) N/A N/A Granulation A mount: Pink, Hyper-granulation N/A N/A Granulation Quality: Medium (34-66%) N/A N/A Necrotic A mount: Fat Layer (Subcutaneous Tissue): Yes N/A N/A Exposed Structures: Fascia: No Tendon: No Muscle: No Joint: No Bone: No None N/A N/A Epithelialization: Treatment Notes Electronic  Signature(s) Signed: 11/23/2022 3:44:25 PM By: Carlene Coria RN Entered By: Carlene Coria on 11/23/2022 10:28:54 -------------------------------------------------------------------------------- Multi-Disciplinary Care Plan Details Patient Name: Date of Service: [name], [name] 11/23/2022 10:15 A M Medical Record Number: [id number] Patient Account Number: [id number] Date of Birth/Sex: Treating RN: 1966-09-03 (56 y.o. [name] Primary Care Elasia Furnish: PA Haig Prophet, [place] Other Clinician: Referring Christalyn Goertz: Treating Harlem Bula/Extender: Cloretta Ned in Treatment: [address] [place] ([id number]) [id number] Page 5 of 8 Active Inactive Osteomyelitis Nursing Diagnoses: Infection: osteomyelitis Knowledge deficit related to disease process and management Potential for infection: osteomyelitis Goals: Diagnostic evaluation for osteomyelitis completed as ordered Date  Initiated: 06/28/2022 Date Inactivated: 08/03/2022 Target Resolution Date: 06/28/2022 Goal Status: Met Patient/caregiver will verbalize understanding of disease process and disease management Date Initiated: 06/28/2022 Date Inactivated: 08/31/2022 Target Resolution Date: 06/28/2022 Goal Status: Met Patient's osteomyelitis will resolve Date Initiated: 06/28/2022 Target Resolution Date: 12/17/2022 Goal Status: Active Signs and symptoms for osteomyelitis will be recognized and promptly addressed Date Initiated: 06/28/2022 Date Inactivated: 08/03/2022 Target Resolution Date: 06/28/2022 Goal Status: Met Interventions: Assess for signs and symptoms of osteomyelitis resolution every visit Provide education on osteomyelitis Screen for HBO Notes: Electronic Signature(s) Signed: 11/23/2022 3:44:25 PM By: Carlene Coria RN Entered By: Carlene Coria on 11/23/2022 10:43:05 -------------------------------------------------------------------------------- Pain Assessment Details Patient Name: Date of  Service: [name], [name] 11/23/2022 10:15 A M Medical Record Number: [id number] Patient Account Number: [id number] Date of Birth/Sex: Treating RN: [birth year] (56 y.o. [name] Primary Care Rieley Hausman: PA Haig Prophet, [place] Other Clinician: Referring Carol Theys: Treating Erma Joubert/Extender: Cloretta Ned in Treatment: 23 Active Problems Location of Pain Severity and Description of Pain Patient Has Paino No Site Locations [place], [place] ([id number]) [phone].pdf Page 6 of 8 Pain Management and Medication Current Pain Management: Electronic Signature(s) Signed: 11/23/2022 3:44:25 PM By: Carlene Coria RN Entered By: Carlene Coria on 11/23/2022 10:23:38 -------------------------------------------------------------------------------- Patient/Caregiver Education Details Patient Name: Date of Service: [name] 12/21/2023andnbsp10:15 A M Medical Record Number: [id number] Patient Account Number: [id number] Date of Birth/Gender: Treating RN: 02/06/1966 (56 y.o. [name] Primary Care Physician: PA Haig Prophet, [place] Other Clinician: Referring Physician: Treating Physician/Extender: Cloretta Ned in Treatment: 23 Education Assessment Education Provided To: Patient Education Topics Provided Wound/Skin Impairment: Methods: Explain/Verbal Responses: State content correctly Electronic Signature(s) Signed: 11/23/2022 3:44:25 PM By: Carlene Coria RN Entered By: Carlene Coria on 11/23/2022 10:42:48 [name] ([id number]) [id number] Page 7 of 8 -------------------------------------------------------------------------------- Wound Assessment Details Patient Name: Date of Service: [name], [name] 11/23/2022 10:15 A M Medical Record Number: [id number] Patient Account Number: [id number] Date of Birth/Sex: Treating RN: 09-20-1966 (56 y.o. [name]) Carlene Coria Primary Care Dreyah Montrose: PA Haig Prophet, [place] Other Clinician: Referring  Doryan Bahl: Treating Charles Niese/Extender: Cloretta Ned in Treatment: 23 Wound Status Wound Number: 1 Primary Etiology: Diabetic Wound/Ulcer of the Lower Extremity Wound Location: Left, Plantar Foot Wound Status: Open Wounding Event: Gradually Appeared Comorbid History: Type II Diabetes Date Acquired: 12/04/2021 Weeks Of Treatment: 23 Clustered Wound: No Pending Amputation On Presentation Photos Wound Measurements Length: (cm) 6.9 Width: (cm) 6 Depth: (cm) 0.3 Area: (cm) 32.515 Volume: (cm) 9.755 % Reduction in Area: 67.7% % Reduction in Volume: 80.6% Epithelialization: None Tunneling: No Undermining: No Wound Description Classification: Grade 4 Exudate Amount: Medium Exudate Type: Serosanguineous Exudate Color: red, brown Foul Odor After Cleansing: No Slough/Fibrino Yes Wound Bed Granulation Amount: Medium (34-66%) Exposed Structure Granulation Quality: Pink, Hyper-granulation Fascia Exposed: No Necrotic Amount: Medium (34-66%) Fat Layer (Subcutaneous Tissue) Exposed: Yes Necrotic Quality: Adherent Slough Tendon Exposed: No Muscle Exposed: No Joint Exposed: No Bone Exposed: No Treatment Notes Wound #1 (Foot) Wound Laterality: Plantar, Left [place], [place] ([id number]) [id number] Page 8 of 8 Topical Primary Dressing Secondary Dressing ABD Pad 5x9 (in/in) Discharge Instruction: Cover with ABD pad Kerlix 4.5 x 4.1 (in/yd) Discharge Instruction: Apply Kerlix 4.5 x 4.1 (in/yd) as instructed Secured With [place] H Soft Cloth Surgical T ape ape, 2x2 (in/yd) Compression Wrap Compression Stockings Add-Ons Electronic Signature(s) Signed: 11/23/2022 3:44:25 PM By: Carlene Coria RN Entered By: Carlene Coria on 11/23/2022 10:28:25 -------------------------------------------------------------------------------- Vitals Details Patient Name: Date  of Service: [name], [name] 11/23/2022  10:15 A M Medical Record Number: [id number] Patient Account Number: [id number] Date of Birth/Sex: Treating RN: Sep 04, 1966 (56 y.o. [name]) Carlene Coria Primary Care Abreanna Drawdy: PA Haig Prophet, [place] Other Clinician: Referring Argelia Formisano: Treating Aliesha Dolata/Extender: Cloretta Ned in Treatment: 23 Vital Signs Time Taken: 10:23 Temperature (F): 98.1 Height (in): 70 Pulse (bpm): 90 Weight (lbs): 230 Respiratory Rate (breaths/min): 18 Body Mass Index (BMI): 33 Blood Pressure (mmHg): 169/100 Reference Range: 80 - 120 mg / dl Electronic Signature(s) Signed: 11/23/2022 3:44:25 PM By: Carlene Coria RN Entered By: Carlene Coria on 11/23/2022 10:23:32

## 2022-11-30 ENCOUNTER — Encounter: Payer: 59 | Admitting: Internal Medicine

## 2022-11-30 ENCOUNTER — Encounter: Payer: Self-pay | Admitting: Infectious Diseases

## 2022-11-30 ENCOUNTER — Ambulatory Visit: Payer: 59 | Attending: Infectious Diseases | Admitting: Infectious Diseases

## 2022-11-30 VITALS — BP 147/99 | HR 98 | Ht 70.0 in

## 2022-11-30 DIAGNOSIS — Z7984 Long term (current) use of oral hypoglycemic drugs: Secondary | ICD-10-CM

## 2022-11-30 DIAGNOSIS — L089 Local infection of the skin and subcutaneous tissue, unspecified: Secondary | ICD-10-CM | POA: Insufficient documentation

## 2022-11-30 DIAGNOSIS — L97522 Non-pressure chronic ulcer of other part of left foot with fat layer exposed: Secondary | ICD-10-CM | POA: Diagnosis not present

## 2022-11-30 DIAGNOSIS — E1169 Type 2 diabetes mellitus with other specified complication: Secondary | ICD-10-CM | POA: Diagnosis not present

## 2022-11-30 DIAGNOSIS — Z7985 Long-term (current) use of injectable non-insulin antidiabetic drugs: Secondary | ICD-10-CM

## 2022-11-30 DIAGNOSIS — Z794 Long term (current) use of insulin: Secondary | ICD-10-CM

## 2022-11-30 DIAGNOSIS — L97524 Non-pressure chronic ulcer of other part of left foot with necrosis of bone: Secondary | ICD-10-CM | POA: Diagnosis not present

## 2022-11-30 DIAGNOSIS — E1142 Type 2 diabetes mellitus with diabetic polyneuropathy: Secondary | ICD-10-CM | POA: Insufficient documentation

## 2022-11-30 DIAGNOSIS — E11628 Type 2 diabetes mellitus with other skin complications: Secondary | ICD-10-CM

## 2022-11-30 DIAGNOSIS — E11621 Type 2 diabetes mellitus with foot ulcer: Secondary | ICD-10-CM | POA: Diagnosis not present

## 2022-11-30 DIAGNOSIS — M86672 Other chronic osteomyelitis, left ankle and foot: Secondary | ICD-10-CM | POA: Diagnosis not present

## 2022-11-30 NOTE — Progress Notes (Signed)
NAME: [name]  DOB: [birth year]  MRN: [id number]  Date/Time: 11/30/2022 8:50 AM  Subjective:   ?follow up visit For diabetic foot infection -left foot- completed 5 months of antibiotic and has been off for nearly a month Doing very well Getting apligraf application at the wound clinic No fever, discharge Foot remains swollen He uses Knee scooter No weight bearing  Following taken from previous note PMH, FH, SH, ROS reviewed  [name] is a 56 y.o. with a history of Dm,last ba1c in June was 11.2, peripheral neuropathy with left foot wound for the past 9 months followed at wound clinic   On 6/1 MR left foot showed concern for early osteomyelitis along plantar side of 5th metatarsal, extensive gas in the plantar soft tissue  Taken to OR on 6/2(Ortho, Dr. Bradd Canary) for left foot debridement and found to have extensive necrotic soft tissue and muscle, tissue sent for Cx.Klebsiella oxytoca, MSSA, strep gordonii, strep cellulitis, Prevotella species beta-lactamase positive. -He refused amputation- He was followed by [facility] infectious disease was sent on 6 weeks of IV . Cefazolin and metronidazole and levaquin. He had a repeat MRI on 06/29/22 and showed Septic arthritis involving the second, third and fourth MTP joints with associated osteomyelitis involving the metatarsals and proximal phalanges.   HE was restarted on Po cefadroxil, levaquin , metronidazole- asked to go for amputation- He came to [facility] wouldn clinic and was started hyperbaric oxygen and came to see me a month ago for 2nd opinion- HE is doing better HE took 9 sessions of HBO and stopped as he could not keep the appts He completed nearly 5 months of antibiotics  Past Medical History:  Diagnosis Date   Diabetes mellitus without complication [facility])     Past Surgical History:  Procedure Laterality Date   I & D EXTREMITY Left 05/05/2022   Procedure: LEFT FOOT DEBRIDEMENT;  Surgeon: Nadara Mustard, MD;  Location: [facility] OR;  Service:  Orthopedics;  Laterality: Left;   MYRINGOTOMY WITH TUBE PLACEMENT Bilateral 07/20/2022   Procedure: MYRINGOTOMY WITH TUBE PLACEMENT;  Surgeon: Vernie Murders, MD;  Location: [facility];  Service: ENT;  Laterality: Bilateral;  Diabetic   TONSILLECTOMY      Social History   Socioeconomic History   Marital status: Single    Spouse name: Not on file   Number of children: Not on file   Years of education: Not on file   Highest education level: Not on file  Occupational History   Not on file  Tobacco Use   Smoking status: Every Day    Packs/day: 0.25    Years: 25.00    Total pack years: 6.25    Types: Cigarettes   Smokeless tobacco: Never   Tobacco comments:    Cutting back - goes through a pack in 4-6 days (Started around age 82)  Vaping Use   Vaping Use: Never used  Substance and Sexual Activity   Alcohol use: No   Drug use: No   Sexual activity: Yes  Other Topics Concern   Not on file  Social History Narrative   Not on file   Social Determinants of Health   Financial Resource Strain: Not on file  Food Insecurity: Not on file  Transportation Needs: Not on file  Physical Activity: Not on file  Stress: Not on file  Social Connections: Not on file  Intimate Partner Violence: Not on file    FH Sister, mother, grandmother - DM Brother- colon cancer  No Known Allergies  Current Outpatient Medications  Medication Sig Dispense Refill   acetaminophen (TYLENOL) 500 MG tablet Take 2,000 mg by mouth every 6 (six) hours as needed for mild pain or moderate pain.     atorvastatin (LIPITOR) 20 MG tablet Take 1 tablet (20 mg total) by mouth daily. 90 tablet 1   ciprofloxacin-dexamethasone (CIPRODEX) OTIC suspension Place 4 drops in both ears twice a day for 5 days 7.5 mL 0   Continuous Blood Gluc Transmit (DEXCOM G6 TRANSMITTER) MISC Use to check blood sugar three times daily. Change transmitter once every 909 days. E11.69 1 each 1   Dulaglutide (TRULICITY) 1.5 MG/0.5ML  SOPN Inject 1.5 mg into the skin once a week. 6 mL 1   gabapentin (NEURONTIN) 300 MG capsule Take 1 capsule (300 mg total) by mouth at bedtime. 30 capsule 3   Insulin NPH, Human,, Isophane, (HUMULIN N KWIKPEN) 100 UNIT/ML Kiwkpen Inject 20 Units into the skin 2 (two) times daily. 30 mL 5   Insulin Pen Needle 32G X 4 MM MISC Use as directed to inject insulin up to 4 times daily. 100 each 0   metFORMIN (GLUCOPHAGE-XR) 500 MG 24 hr tablet Take 1 tablet (500 mg total) by mouth 2 (two) times daily with a meal. After 1 week, increase to 2 tablets (1000mg ) 2 times daily if tolerable. 120 tablet 2   methocarbamol (ROBAXIN) 500 MG tablet Take 1 tablet (500 mg total) by mouth every 6 (six) hours as needed for muscle spasms. 30 tablet 0   Semaglutide,0.25 or 0.5MG /DOS, (OZEMPIC, 0.25 OR 0.5 MG/DOSE,) 2 MG/1.5ML SOPN Inject into the skin.     No current facility-administered medications for this visit.     Abtx:  Anti-infectives (From admission, onward)    None       REVIEW OF SYSTEMS:  Const: negative fever, negative chills, negative weight loss Eyes: negative diplopia or visual changes, negative eye pain ENT: negative coryza, negative sore throat Resp: negative cough, hemoptysis, dyspnea Cards: negative for chest pain, palpitations, lower extremity edema GU: negative for frequency, dysuria and hematuria GI: Negative for abdominal pain, diarrhea, bleeding, constipation Skin: negative for rash and pruritus Heme: negative for easy bruising and gum/nose bleeding foot pain MUCH IMPROved- not on any meds now Neurolo:negative for headaches, dizziness, vertigo, memory problems  Psych: negative for feelings of anxiety, depression  Endocrine:  diabetes- got a new pcp and on metformin xr Allergy/Immunology- negative for any medication or food allergies ?  Objective:  VITALS:  BP (!) 147/99   Pulse 98   Ht 5\' 10"  (1.778 m)   BMI 34.26 kg/m   PHYSICAL EXAM:  General: Alert, cooperative,  no distress, appears stated age.  Lungs: Clear to auscultation bilaterally. No Wheezing or Rhonchi. No rales. Heart: Regular rate and rhythm, no murmur, rub or gallop. Abdomen: Soft, non-tender,not distended. Bowel sounds normal. No masses Extremities:  11/30/22      10/31/22  08/29/22   08/01/22   07/10/22   07/04/22   06/20/22   05/18/22   05/09/22   05/04/22    Skin: No rashes or lesions. Or bruising Lymph: Cervical, supraclavicular normal. Neurologic: peripheral neuropathy Pertinent Labs   ? Impression/Recommendation DM poorly controlled with peripheral neuropathy with left foot  wound with underlying osteomyelitis- debridement in June  Was told to get amputation, wanted to save his foot at all cost- so transferred care ot [facility] wound center- started hyperbaric oxygen- and got 9 and then stopped as he could not go there 5  times a week  Wound improving significantly ? Completed cefadroxil, levaquin and flagyl X 4 months and prior to that nearly 8 weeks of IV Oral antibiotics stopped 10/31/22 The wound looks really good and nice granulation tissue- getting apligraf at the wound clinic Will not need any more antibiotics unless it starts getting worse   DM-  on metformin XR Last Hba1c was 11.2-  Discussed healthy eating, foot care  Discussed the management with the patient in detail Follow  PRN

## 2022-11-30 NOTE — Progress Notes (Addendum)
CARL, BUTNER ([id number]) [id number] Page 1 of 8 Visit Report for 11/30/2022 Cellular or Tissue Based Product Details Patient Name: Date of Service: [name], [name] 11/30/2022 10:00 A M Medical Record Number: [id number] Patient Account Number: [id number] Date of Birth/Sex: Treating RN: 02-10-1966 (56 y.o. [name] Primary Care Provider: PA Zenovia Jordan, NO Other Clinician: Referring Provider: Treating Provider/Extender: RO BSO Dorris Carnes, MICHA EL Veneta Penton in Treatment: 24 Cellular or Tissue Based Product Type Wound #1 Left,Plantar Foot Applied to: Performed By: Physician Maxwell Caul, MD Cellular or Tissue Based Product Type: Apligraf Level of Consciousness (Pre-procedure): Awake and Alert Pre-procedure Verification/Time Out No Taken: Location: genitalia / hands / feet / multiple digits Wound Size (sq cm): 40.32 Product Size (sq cm): 44 Waste Size (sq cm): 0 Amount of Product Applied (sq cm): 44 Instrument Used: Forceps, Scissors Lot #: [id number] Expiration Date: 12/07/2022 Secured: Yes Secured With: Steri-Strips Dressing Applied: Yes Primary Dressing: ADAPTIC Procedural Pain: 0 Post Procedural Pain: 0 Response to Treatment: Procedure was tolerated well Level of Consciousness (Post- Awake and Alert procedure): Post Procedure Diagnosis Same as Pre-procedure Electronic Signature(s) Signed: 11/30/2022 4:20:45 PM By: Midge Aver MSN RN CNS WTA Entered By: Midge Aver on 11/30/2022 16:20:45 -------------------------------------------------------------------------------- HPI Details Patient Name: Date of Service: [name], [name] 11/30/2022 10:00 A M Medical Record Number: [id number] Patient Account Number: [id number] Date of Birth/Sex: Treating RN: January 26, 1966 (56 y.o. [name] ([id number]) [phone].pdf Page 2 of 8 Primary Care Provider: PA TIENT, NO Other Clinician: Referring  Provider: Treating Provider/Extender: RO BSO N, MICHA EL [name] Weeks in Treatment: 24 History of Present Illness HPI Description: Notes from [facility] under Dr. America Brown care:   ADMISSION6/15/2023This is a 56 year old poorly controlled type II diabetic (last A1c 11.5%). In January of this year, he presented to the hospital with an abscess in his left foot. He also had a foreign body present. It was removed in the ER and he was admitted to the hospital for IV antibiotics. He was subsequently discharged on oral antibiotics. Due to financial concerns, he has not taken his diabetic medications (metformin and 70/30 insulin) and as a result, presented back to the emergency room on June 1 with worsening findings, including frank pus and osteomyelitis on MRI. BKA was recommended, but he declined. He was taken to the operating room by Dr. Lajoyce Corners who performed an extensive debridement. Cultures were taken and he was placed on IV antibiotics. He saw infectious disease while in the hospital who prescribed a 6-week course of IV antibiotics including cefazolin as well as oral metronidazole and levofloxacin. The infectious disease provider also indicated that he would benefit from below-knee amputation, but the patient desired another opinion and therefore he has been referred to wound care center for further evaluation and management. ABIs obtained while he was in the hospital demonstrate adequate blood flow. Essentially the entire bottom of the patient's left foot has been removed. The heads of the majority of his metatarsal bones are exposed along with their accompanying tendons. Muscle and fat are exposed as well. The wound surface is fairly dry; the patient reports that he was not given any specific wound care instructions other than to place a dry dressing on it after showering. There is no odor or purulent drainage identified. 05/26/2022: The wound measures a little bit  smaller today. There is some good granulation tissue beginning to form on the surface. He still has exposed bone and tendon. There is some  slough accumulation. He saw infectious disease earlier this week and was frustrated that they recommended amputation. He is interested in another surgical opinion. He continues to smoke but says that he has cut down. He is using a knee scooter for mobility so that he can stay off of his foot. 06/05/2022: He saw Dr. Lajoyce Corners last week who was supportive of our efforts to try to salvage the foot. I referred him to vascular surgery to see Dr. Chestine Spore but he has not received an appointment yet. He continues on IV antibiotics. He says that after our conversation last week about him being unable to initiate hyperbarics if he was going to continue to smoke, he has not had a cigarette since. He is now at 4 weeks of conventional management and his insurance was initiated on July 1. Today, the wound continues to show evidence of improvement. There is good granulation tissue forming. The metatarsals are still exposed but actually have some pink periosteum. There is still some slough and fibrinous exudate present. No odor or purulent drainage. We have just been using Dakin's dressing changes for now. Admission to Wound Care Center at [facility]: 06-15-2022 upon evaluation today patient presents for initial inspection here in our clinic though he has been seen by Dr. Lady Gary in [place] up to this point. I did review her notes and records as well they have been using Dakin's moistened gauze dressing which has done a great job at helping to clean out the bottom of his foot. Please see the discourse above for additional information in regard to treatment course up until the patient arrives here in the [facility] clinic today. The patient is ready to get started with HBO therapy as soon as possible I do think based on all my review of his records this seems to be appropriate he does have  chronic osteomyelitis is also been offered amputation this is obviously a limb salvage operation here. We are trying to prevent him from having to undergo an extensive amputation which would likely be a below-knee amputation which in turn is going to affect his quality of life he wants to try to avoid that if at all possible I think hyperbaric oxygen therapy is his best bet to achieve that goal. Currently he has been on IV Ancef. That actually ends tomorrow. Following that according to notes it appears he supposed to be taking Levaquin and Flagyl for an additional period of time although I am not certain exactly how long that with the. He has been seeing regional physicians infectious disease in [place] and again that so I recommended that he contact today in order to find out what the plan is going forward. His most recent hemoglobin A1c as noted above was 11.5 on 05-07-2022 he tells me trying to work on that he is also quit smoking as of this point. 06-22-2022 upon evaluation today patient appears to be doing well currently in regard to his wound all things considered. He is showing signs of a lot of new granulation tissue and overall very pleased. He is changing this once a day with the Dakin's moistened gauze dressing. Fortunately I do not see any evidence of infection locally or systemically at this time. We have been trying to get him into the hyperbaric chamber he was having a lot of issues with sinus pressure and we have made referral and called multiple ENT specialist the earliest we have been able to get his August 9 at [facility] ear nose throat. They are going  to try to work him in sooner if they have any opportunities to do so. Obviously I think the sooner he can do this the better. We need to get him in hyperbarics as quickly as possible. 7/26; patient presents for follow-up. He has been using Dakin's wet-to-dry dressings and oil emollient dressing over the exposed bone. He currently  denies systemic signs of infection. He states he is scheduled to see infectious disease, Dr Thedore Mins next week. She had ordered an MRI of the left foot and he is scheduled to have this done tomorrow. He currently denies systemic signs of infection Including fever/chills, nausea/vomiting increased warmth or erythema to the wound bed or purulent drainage. He started hyperbaric oxygen treatment however did not tolerate this due to sinus pressure. He is scheduled to see ENT in 2 weeks for evaluation. He is not continuing HBO until he is cleared by ENT. 07-06-2022 patient presents today for follow-up concerning his plantar foot ulcer. The good news is this actually is significantly better compared to previous. Some of the bone which is necrotic is starting to slough off and I am going to perform debridement to clear this away today. Nonetheless I do think that the patient is going to need hyperbarics and we need to try to get it as soon as possible. He still having a lot of sinus pressure I can even hear the congestion today. He is on 3 different antibiotics cefadroxil, metronidazole, and Levaquin currently for the osteomyelitis. For that reason this should actually help with the infection if he does have a chronic sinusitis I am also going to see about getting him on steroids to try to see if this can benefit him as well. He voiced understanding and he is in agreement with giving that shot. This is probably can to spike his blood sugars which I discussed with him he is going to be seeing his primary care provider later today I want him to let her know as well what is going on and why so that she could be aware but I really do think he needs to take the prednisone. We need to get him in the chamber as soon as possible to try to help with getting this wound to heal and closed so that he does not lose his foot. 07-13-2022 upon evaluation today patient appears to be doing better in regard to his wound he continues  to show signs of new granulation am going to perform some debridement today but overall this is doing quite well. 07-20-2022 upon evaluation today patient's wound is actually showing some signs of improvement he does have 1 area on the fifth metatarsal region where there is some necrotic tendon that is noted at this point. Unfortunately this is preventing good granulation and over this point the rest of the metatarsal region is completely covered over with granulation tissue which is great news there is some other slough and biofilm buildup that I will clearway as well. 07-27-2022 upon evaluation today patient appears to be doing well currently in regard to his plantar foot ulcer. He has been tolerating the dressing changes without complication fortunately. I do believe he is making excellent progress and each time I see him this is better and better is pretty much completely covered as far as any bone exposure is concerned and he still is on the IV antibiotics were still also undergoing hyperbarics at this point that he just got started and is doing quite well with since getting the tubes in  his ears.Marland Kitchen 08-03-2022 upon evaluation today patient appears to be doing well in general although has been having a lot of drainage compared to normal. It was actually leaking through his dressing today. He tells me he changed it last night. With that being said he has been up on his feet a lot more which I think is probably a big part of the issue here. Fortunately I do not see any signs of infection locally or systemically. 08-17-2022 upon evaluation today patient is doing well with regard to his wound this is actually measuring smaller which is good news. I am very pleased in that [place], [name] ([id number]) [id number] Page 3 of 8 regard. With that being said he has not been participating HBO therapy as he really needs to afford to see this heal appropriately and completely. In fact he has  only made 9 total HBO treatments in the past 4 weeks. Again this is not therapeutic and I discussed that with the patient. Nonetheless he is still improving and since the hyperbarics is not helping based on the discussion today the patient is going to discontinue HBO therapy at this point. He tells me that getting here 5 days a week just is not possible. He has had difficulty getting here and then also when he was here had difficulty with getting here at the right time which has also been part of the problem. Nonetheless either way I am pleased that he is doing better and my hope is that we will still be able to get him healed obviously he should continue with the antibiotics as well as the wound care as he has been doing this seems to be doing excellent for him but everything is improving quite nicely. 08-24-2022 upon evaluation today patient appears to be doing well currently in regard to his wound this is actually measuring significantly better even compared to last week. I am very pleased in that regard I do not see any signs of infection and I think he is making excellent progress here. 08-31-2022 upon evaluation today patient's wound is actually showing signs of significant improvement. I am actually very pleased with where things stand currently. There does not appear to be any evidence of active infection locally or systemically at this time. No fevers, chills, nausea, vomiting, or diarrhea. 09-19-2022 upon evaluation patient's wound is actually showing signs of improvement though it still having quite a bit of drainage. I do believe he would benefit from switching over to [facility]. I discussed that with him today. He is definitely in agreement with that plan. 09-28-2022 upon evaluation patient's wound is actually showing signs of improvement. With that being said he has been using the [facility] but he has not had enough to really cover the whole wound. In fact he tells me he just got  his supplies today that we had ordered last week. Fortunately I do not see any evidence of active infection at this time. 10-05-2022 upon evaluation today patient appears to be doing well currently in regard to his foot wound there is actually much less debridement today to be done compared to previous. Fortunately I see no signs of active infection locally or systemically at this time. 11-02-2022 upon evaluation today patient appears to be doing well currently in regard to his wounds on the foot. Fortunately he seems to be making good progress is not quite as much of an improvement this week as it was last time I saw him but he had a lot  going on and tells me that he has been up moving on it more. I do believe he needs to try to get this under control and I do believe staying off of it more will definitely help in that regard. 11-09-2022 upon evaluation today patient appears to be doing well currently in regard to his wound. He is showing signs of improvement which is great news. Fortunately I do not see any evidence of active infection locally nor systemically at this time. No fever or chills noted 11-16-2022 patient's wound still slowly seems to be making progress. The good news is we have gotten the approval for the Apligraf which is awesome. Working to see about getting that started form hopefully will be able to get to him for the end of the year and then subsequently we can see if we can regain the approval for the final 3 in the first of the new year. Nonetheless I think getting this started as quickly as possible would be good for the patient currently. I do believe this is going to have a good result as far as helping with the healing and new tissue growth and epithelization in regard to the wound. He still using the offloading with a knee scooter at this point. 11-23-2022 upon evaluation today patient appears to be doing well currently in regard to his wound he does have a lot of drainage but  fortunately no signs of infection at this point. Fortunately there does not appear to be any signs of active infection locally nor systemically at this time which is great news and overall I am extremely pleased with regard to where we stand. No fevers, chills, nausea, vomiting, or diarrhea. 12/28; patient arrived for Apligraf #2 today. The area is large and on the left plantar foot some irregular areas of hypergranulation however most of this looks clean. I think we are changing weekly because of drainage. Electronic Signature(s) Signed: 11/30/2022 4:17:44 PM By: Baltazar Najjar MD Entered By: Baltazar Najjar on 11/30/2022 10:59:58 -------------------------------------------------------------------------------- Physical Exam Details Patient Name: Date of Service: [name], [name] 11/30/2022 10:00 A M Medical Record Number: [id number] Patient Account Number: [id number] Date of Birth/Sex: Treating RN: 03/03/1966 (56 y.o. [name] Primary Care Provider: PA Zenovia Jordan, NO Other Clinician: Referring Provider: Treating Provider/Extender: RO BSO N, MICHA EL Veneta Penton in Treatment: 24 Constitutional Patient is hypertensive.. Pulse regular and within target range for patient.Marland Kitchen Respirations regular, non-labored and within target range.. Temperature is normal and within the target range for the patient.Marland Kitchen appears in no distress. Notes Wound exam; generally healthy looking granulation. He has rims of overhanging skin especially superiorly but I did not think any of this need to debride debrided. Some of the granulation is somewhat irregular however I did not feel any debridement was necessary. We applied Apligraf #2 in the standard fashion. No evidence of surrounding infection Electronic Signature(s) Signed: 11/30/2022 4:17:44 PM By: Baltazar Najjar MD [place], Nedra Hai ([id number]) 4:17:44 PM By: Baltazar Najjar MD [phone].pdf Page 4 of 8 Signed: 11/30/2022 Entered  By: Baltazar Najjar on 11/30/2022 11:00:46 -------------------------------------------------------------------------------- Physician Orders Details Patient Name: Date of Service: [name], [name] 11/30/2022 10:00 A M Medical Record Number: [id number] Patient Account Number: [id number] Date of Birth/Sex: Treating RN: Apr 15, 1966 (57 y.o. [name] Primary Care Provider: PA Zenovia Jordan, NO Other Clinician: Referring Provider: Treating Provider/Extender: RO BSO N, MICHA EL Veneta Penton in Treatment: 24 Verbal / Phone Orders: No Diagnosis Coding Follow-up Appointments Return Appointment in 1 week.  Anesthetic (Use 'Patient Medications' Section for Anesthetic Order Entry) Lidocaine applied to wound bed Cellular or Tissue Based Products Cellular or Tissue Based Product Type: - apligraf Cellular or Tissue Based Product applied to wound bed; including contact layer, fixation with steri-strips, dry gauze and cover dressing. (DO NOT REMOVE). Edema Control - Lymphedema / Segmental Compressive Device / Other Elevate, Exercise Daily and A void Standing for Long Periods of Time. Elevate legs to the level of the heart and pump ankles as often as possible Elevate leg(s) parallel to the floor when sitting. Wound Treatment Wound #1 - Foot Wound Laterality: Plantar, Left Secondary Dressing: ABD Pad 5x9 (in/in) (Generic) 1 x Per Day/30 Days Discharge Instructions: Cover with ABD pad Secondary Dressing: Kerlix 4.5 x 4.1 (in/yd) (Generic) 1 x Per Day/30 Days Discharge Instructions: Apply Kerlix 4.5 x 4.1 (in/yd) as instructed Secured With: Medipore T - 5M Medipore H Soft Cloth Surgical T ape ape, 2x2 (in/yd) (Generic) 1 x Per Day/30 Days Electronic Signature(s) Signed: 11/30/2022 4:17:44 PM By: Baltazar Najjar MD Signed: 11/30/2022 4:56:13 PM By: Midge Aver MSN RN CNS WTA Entered By: Midge Aver on 11/30/2022 11:15:41 Progress Note  Details -------------------------------------------------------------------------------- [name] ([id number]) [id number] Page 5 of 8 Patient Name: Date of Service: [name], [name] 11/30/2022 10:00 A M Medical Record Number: [id number] Patient Account Number: [id number] Date of Birth/Sex: Treating RN: Jan 16, 1966 (56 y.o. [name] Primary Care Provider: PA Zenovia Jordan, NO Other Clinician: Referring Provider: Treating Provider/Extender: RO BSO N, MICHA EL [name] Weeks in Treatment: 24 Subjective History of Present Illness (HPI) Notes from [facility] under Dr. America Brown care:   ADMISSION6/15/2023This is a 56 year old poorly controlled type II diabetic (last A1c 11.5%). In January of this year, he presented to the hospital with an abscess in his left foot. He also had a foreign body present. It was removed in the ER and he was admitted to the hospital for IV antibiotics. He was subsequently discharged on oral antibiotics. Due to financial concerns, he has not taken his diabetic medications (metformin and 70/30 insulin) and as a result, presented back to the emergency room on June 1 with worsening findings, including frank pus and osteomyelitis on MRI. BKA was recommended, but he declined. He was taken to the operating room by Dr. Lajoyce Corners who performed an extensive debridement. Cultures were taken and he was placed on IV antibiotics. He saw infectious disease while in the hospital who prescribed a 6-week course of IV antibiotics including cefazolin as well as oral metronidazole and levofloxacin. The infectious disease provider also indicated that he would benefit from below-knee amputation, but the patient desired another opinion and therefore he has been referred to wound care center for further evaluation and management. ABIs obtained while he was in the hospital demonstrate adequate blood flow. Essentially the entire bottom of the patient's  left foot has been removed. The heads of the majority of his metatarsal bones are exposed along with their accompanying tendons. Muscle and fat are exposed as well. The wound surface is fairly dry; the patient reports that he was not given any specific wound care instructions other than to place a dry dressing on it after showering. There is no odor or purulent drainage identified. 05/26/2022: The wound measures a little bit smaller today. There is some good granulation tissue beginning to form on the surface. He still has exposed bone and tendon. There is some slough accumulation. He saw infectious disease earlier this week and was frustrated that they  recommended amputation. He is interested in another surgical opinion. He continues to smoke but says that he has cut down. He is using a knee scooter for mobility so that he can stay off of his foot. 06/05/2022: He saw Dr. Lajoyce Corners last week who was supportive of our efforts to try to salvage the foot. I referred him to vascular surgery to see Dr. Chestine Spore but he has not received an appointment yet. He continues on IV antibiotics. He says that after our conversation last week about him being unable to initiate hyperbarics if he was going to continue to smoke, he has not had a cigarette since. He is now at 4 weeks of conventional management and his insurance was initiated on July 1. Today, the wound continues to show evidence of improvement. There is good granulation tissue forming. The metatarsals are still exposed but actually have some pink periosteum. There is still some slough and fibrinous exudate present. No odor or purulent drainage. We have just been using Dakin's dressing changes for now. Admission to Wound Care Center at [facility]: 06-15-2022 upon evaluation today patient presents for initial inspection here in our clinic though he has been seen by Dr. Lady Gary in [place] up to this point. I did review her notes and records as well they have been using  Dakin's moistened gauze dressing which has done a great job at helping to clean out the bottom of his foot. Please see the discourse above for additional information in regard to treatment course up until the patient arrives here in the [facility] clinic today. The patient is ready to get started with HBO therapy as soon as possible I do think based on all my review of his records this seems to be appropriate he does have chronic osteomyelitis is also been offered amputation this is obviously a limb salvage operation here. We are trying to prevent him from having to undergo an extensive amputation which would likely be a below-knee amputation which in turn is going to affect his quality of life he wants to try to avoid that if at all possible I think hyperbaric oxygen therapy is his best bet to achieve that goal. Currently he has been on IV Ancef. That actually ends tomorrow. Following that according to notes it appears he supposed to be taking Levaquin and Flagyl for an additional period of time although I am not certain exactly how long that with the. He has been seeing regional physicians infectious disease in [place] and again that so I recommended that he contact today in order to find out what the plan is going forward. His most recent hemoglobin A1c as noted above was 11.5 on 05-07-2022 he tells me trying to work on that he is also quit smoking as of this point. 06-22-2022 upon evaluation today patient appears to be doing well currently in regard to his wound all things considered. He is showing signs of a lot of new granulation tissue and overall very pleased. He is changing this once a day with the Dakin's moistened gauze dressing. Fortunately I do not see any evidence of infection locally or systemically at this time. We have been trying to get him into the hyperbaric chamber he was having a lot of issues with sinus pressure and we have made referral and called multiple ENT specialist the  earliest we have been able to get his August 9 at [facility] ear nose throat. They are going to try to work him in sooner if they have any opportunities to  do so. Obviously I think the sooner he can do this the better. We need to get him in hyperbarics as quickly as possible. 7/26; patient presents for follow-up. He has been using Dakin's wet-to-dry dressings and oil emollient dressing over the exposed bone. He currently denies systemic signs of infection. He states he is scheduled to see infectious disease, Dr Thedore MinsSingh next week. She had ordered an MRI of the left foot and he is scheduled to have this done tomorrow. He currently denies systemic signs of infection Including fever/chills, nausea/vomiting increased warmth or erythema to the wound bed or purulent drainage. He started hyperbaric oxygen treatment however did not tolerate this due to sinus pressure. He is scheduled to see ENT in 2 weeks for evaluation. He is not continuing HBO until he is cleared by ENT. 07-06-2022 patient presents today for follow-up concerning his plantar foot ulcer. The good news is this actually is significantly better compared to previous. Some of the bone which is necrotic is starting to slough off and I am going to perform debridement to clear this away today. Nonetheless I do think that the patient is going to need hyperbarics and we need to try to get it as soon as possible. He still having a lot of sinus pressure I can even hear the congestion today. He is on 3 different antibiotics cefadroxil, metronidazole, and Levaquin currently for the osteomyelitis. For that reason this should actually help with the infection if he does have a chronic sinusitis I am also going to see about getting him on steroids to try to see if this can benefit him as well. He voiced understanding and he is in agreement with giving that shot. This is probably can to spike his blood sugars which I discussed with him he is going to be seeing his  primary care provider later today I want him to let her know as well what is going on and why so that she could be aware but I really do think he needs to take the prednisone. We need to get him in the chamber as soon as possible to try to help with getting this wound to heal and closed so that he does not lose his foot. 07-13-2022 upon evaluation today patient appears to be doing better in regard to his wound he continues to show signs of new granulation am going to perform some debridement today but overall this is doing quite well. 07-20-2022 upon evaluation today patient's wound is actually showing some signs of improvement he does have 1 area on the fifth metatarsal region where there is some necrotic tendon that is noted at this point. Unfortunately this is preventing good granulation and over this point the rest of the metatarsal region is completely covered over with granulation tissue which is great news there is some other slough and biofilm buildup that I will clearway as well. 07-27-2022 upon evaluation today patient appears to be doing well currently in regard to his plantar foot ulcer. He has been tolerating the dressing changes without complication fortunately. I do believe he is making excellent progress and each time I see him this is better and better is pretty much completely covered as far as any bone exposure is concerned and he still is on the IV antibiotics were still also undergoing hyperbarics at this point that he just got started and is doing quite well with since getting the tubes in his ears.Marland Kitchen. 08-03-2022 upon evaluation today patient appears to be doing well in general  although has been having a lot of drainage compared to normal. It was actually leaking through his dressing today. He tells me he changed it last night. With that being said he has been up on his feet a lot more which I think is probably a [employer], [name] ([id number]) [id number] Page 6 of  8 big part of the issue here. Fortunately I do not see any signs of infection locally or systemically. 08-17-2022 upon evaluation today patient is doing well with regard to his wound this is actually measuring smaller which is good news. I am very pleased in that regard. With that being said he has not been participating HBO therapy as he really needs to afford to see this heal appropriately and completely. In fact he has only made 9 total HBO treatments in the past 4 weeks. Again this is not therapeutic and I discussed that with the patient. Nonetheless he is still improving and since the hyperbarics is not helping based on the discussion today the patient is going to discontinue HBO therapy at this point. He tells me that getting here 5 days a week just is not possible. He has had difficulty getting here and then also when he was here had difficulty with getting here at the right time which has also been part of the problem. Nonetheless either way I am pleased that he is doing better and my hope is that we will still be able to get him healed obviously he should continue with the antibiotics as well as the wound care as he has been doing this seems to be doing excellent for him but everything is improving quite nicely. 08-24-2022 upon evaluation today patient appears to be doing well currently in regard to his wound this is actually measuring significantly better even compared to last week. I am very pleased in that regard I do not see any signs of infection and I think he is making excellent progress here. 08-31-2022 upon evaluation today patient's wound is actually showing signs of significant improvement. I am actually very pleased with where things stand currently. There does not appear to be any evidence of active infection locally or systemically at this time. No fevers, chills, nausea, vomiting, or diarrhea. 09-19-2022 upon evaluation patient's wound is actually showing signs of improvement  though it still having quite a bit of drainage. I do believe he would benefit from switching over to [facility]. I discussed that with him today. He is definitely in agreement with that plan. 09-28-2022 upon evaluation patient's wound is actually showing signs of improvement. With that being said he has been using the [facility] but he has not had enough to really cover the whole wound. In fact he tells me he just got his supplies today that we had ordered last week. Fortunately I do not see any evidence of active infection at this time. 10-05-2022 upon evaluation today patient appears to be doing well currently in regard to his foot wound there is actually much less debridement today to be done compared to previous. Fortunately I see no signs of active infection locally or systemically at this time. 11-02-2022 upon evaluation today patient appears to be doing well currently in regard to his wounds on the foot. Fortunately he seems to be making good progress is not quite as much of an improvement this week as it was last time I saw him but he had a lot going on and tells me that he has been up moving on it more.  I do believe he needs to try to get this under control and I do believe staying off of it more will definitely help in that regard. 11-09-2022 upon evaluation today patient appears to be doing well currently in regard to his wound. He is showing signs of improvement which is great news. Fortunately I do not see any evidence of active infection locally nor systemically at this time. No fever or chills noted 11-16-2022 patient's wound still slowly seems to be making progress. The good news is we have gotten the approval for the Apligraf which is awesome. Working to see about getting that started form hopefully will be able to get to him for the end of the year and then subsequently we can see if we can regain the approval for the final 3 in the first of the new year. Nonetheless I think  getting this started as quickly as possible would be good for the patient currently. I do believe this is going to have a good result as far as helping with the healing and new tissue growth and epithelization in regard to the wound. He still using the offloading with a knee scooter at this point. 11-23-2022 upon evaluation today patient appears to be doing well currently in regard to his wound he does have a lot of drainage but fortunately no signs of infection at this point. Fortunately there does not appear to be any signs of active infection locally nor systemically at this time which is great news and overall I am extremely pleased with regard to where we stand. No fevers, chills, nausea, vomiting, or diarrhea. 12/28; patient arrived for Apligraf #2 today. The area is large and on the left plantar foot some irregular areas of hypergranulation however most of this looks clean. I think we are changing weekly because of drainage. Objective Constitutional Patient is hypertensive.. Pulse regular and within target range for patient.Marland Kitchen Respirations regular, non-labored and within target range.. Temperature is normal and within the target range for the patient.Marland Kitchen appears in no distress. Vitals Time Taken: 10:25 AM, Height: 70 in, Weight: 230 lbs, BMI: 33, Temperature: 98.4 F, Pulse: 94 bpm, Respiratory Rate: 16 breaths/min, Blood Pressure: 151/98 mmHg. General Notes: Wound exam; generally healthy looking granulation. He has rims of overhanging skin especially superiorly but I did not think any of this need to debride debrided. Some of the granulation is somewhat irregular however I did not feel any debridement was necessary. We applied Apligraf #2 in the standard fashion. No evidence of surrounding infection Integumentary (Hair, Skin) Wound #1 status is Open. Original cause of wound was Gradually Appeared. The date acquired was: 12/04/2021. The wound has been in treatment 24 weeks. The wound is located  on the Left,Plantar Foot. The wound measures 6.3cm length x 6.4cm width x 0.2cm depth; 31.667cm^2 area and 6.333cm^3 volume. There is Fat Layer (Subcutaneous Tissue) exposed. There is a medium amount of serosanguineous drainage noted. There is medium (34-66%) pink, hyper - granulation within the wound bed. There is a medium (34-66%) amount of necrotic tissue within the wound bed including Adherent Slough. Procedures Wound #1 Pre-procedure diagnosis of Wound #1 is a Diabetic Wound/Ulcer of the Lower Extremity located on the Left,Plantar Foot. A skin graft procedure using a bioengineered skin substitute/cellular or tissue based product was performed by Maxwell Caul, MD with the following instrument(s): Forceps and Scissors. Apligraf was applied and secured with Steri-Strips. 44 sq cm of product was utilized and 0 sq cm was wasted. Post Application, ADAPTIC  was applied. The procedure was tolerated well with a pain level of 0 throughout and a pain level of 0 following the procedure. Post procedure Diagnosis Wound #1: Same as Pre-Procedure . [name], [name] ([id number]) [id number] Page 7 of 8 Plan Follow-up Appointments: Return Appointment in 1 week. Anesthetic (Use 'Patient Medications' Section for Anesthetic Order Entry): Lidocaine applied to wound bed Cellular or Tissue Based Products: Cellular or Tissue Based Product Type: - apligraf Cellular or Tissue Based Product applied to wound bed; including contact layer, fixation with steri-strips, dry gauze and cover dressing. (DO NOT REMOVE). Edema Control - Lymphedema / Segmental Compressive Device / Other: Elevate, Exercise Daily and Avoid Standing for Long Periods of Time. Elevate legs to the level of the heart and pump ankles as often as possible Elevate leg(s) parallel to the floor when sitting. WOUND #1: - Foot Wound Laterality: Plantar, Left Secondary Dressing: ABD Pad 5x9 (in/in) (Generic) 1 x Per Day/30  Days Discharge Instructions: Cover with ABD pad Secondary Dressing: Kerlix 4.5 x 4.1 (in/yd) (Generic) 1 x Per Day/30 Days Discharge Instructions: Apply Kerlix 4.5 x 4.1 (in/yd) as instructed Secured With: Medipore T - 20M Medipore H Soft Cloth Surgical T ape ape, 2x2 (in/yd) (Generic) 1 x Per Day/30 Days 1. Apligraf #2 in the standard fashion 2. I have advised him to rigorously offload this area in his scooter. I explained the nature of Apligraf and it was probably not well-designed for weightbearing Electronic Signature(s) Signed: 12/06/2022 12:43:47 PM By: Elliot Gurney, BSN, RN, CWS, Kim RN, BSN Signed: 12/18/2022 8:37:36 AM By: Baltazar Najjar MD Previous Signature: 11/30/2022 4:17:44 PM Version By: Baltazar Najjar MD Entered By: Elliot Gurney, BSN, RN, CWS, Kim on 12/06/2022 12:43:47 -------------------------------------------------------------------------------- SuperBill Details Patient Name: Date of Service: [name], [name] 11/30/2022 Medical Record Number: [id number] Patient Account Number: [id number] Date of Birth/Sex: Treating RN: 1966-02-01 (56 y.o. [name] Primary Care Provider: PA TIENT, NO Other Clinician: Referring Provider: Treating Provider/Extender: RO BSO N, MICHA EL Veneta [place] in Treatment: 24 Diagnosis Coding ICD-10 Codes Code Description [phone] Other chronic osteomyelitis, left ankle and foot E11.621 Type 2 diabetes mellitus with foot ulcer L97.524 Non-pressure chronic ulcer of other part of left foot with necrosis of bone Facility Procedures : CPT4 Code: 62952841 Description: 99213 - WOUND CARE VISIT-LEV 3 EST PT Modifier: Quantity: 1 : CPT4 Code: 32440102 Description: 15275 - SKIN SUB GRAFT FACE/NK/HF/G ICD-10 Diagnosis Description L97.524 Non-pressure chronic ulcer of other part of left foot with necrosis of bone E11.621 Type 2 diabetes mellitus with foot ulcer Modifier: Quantity: 1 [name], [name] Code: [id number] EE ([id number]) Description: [phone]  (Facility Use Only) Apligraf 44 SQ CM [id number] Modifier: [id number] Quantity: 44 Page 8 of 8 Physician Procedures : CPT4 Code Description Modifier [phone] 15275 - WC PHYS SKIN SUB GRAFT FACE/NK/HF/G ICD-10 Diagnosis Description L97.524 Non-pressure chronic ulcer of other part of left foot with necrosis of bone E11.621 Type 2 diabetes mellitus with foot ulcer Quantity: 1 Electronic Signature(s) Signed: 12/14/2022 5:11:33 PM By: Elliot Gurney, BSN, RN, CWS, Kim RN, BSN Signed: 12/18/2022 8:37:36 AM By: Baltazar Najjar MD Previous Signature: 11/30/2022 4:21:08 PM Version By: Midge Aver MSN RN CNS WTA Previous Signature: 12/01/2022 12:28:22 PM Version By: Baltazar Najjar MD Previous Signature: 11/30/2022 4:17:44 PM Version By: Baltazar Najjar MD Entered By: Elliot Gurney, BSN, RN, CWS, Kim on 12/14/2022 17:11:32

## 2022-11-30 NOTE — Patient Instructions (Addendum)
You are here for follow up of the of the left foot infection the wound is doing much better and smaller- you are off antibiotics for nearly a month- you are followed by wound clinic with application of apligraf- I will follow you as needed

## 2022-11-30 NOTE — Progress Notes (Signed)
[name] ([id number]) [id number] Page 1 of 8 Visit Report for 11/30/2022 Arrival Information Details Patient Name: Date of Service: [name], [name] 11/30/2022 10:00 A M Medical Record Number: [id number] Patient Account Number: [id number] Date of Birth/Sex: Treating RN: 01/18/1966 (56 y.o. Seward Meth Primary Care Kelcey Wickstrom: PA Haig Prophet, NO Other Clinician: Referring Nicklaus Alviar: Treating Vincenta Steffey/Extender: RO BSO N, MICHA EL Randa Ngo in Treatment: 24 Visit Information History Since Last Visit Added or deleted any medications: No Patient Arrived: Knee Scooter Any new allergies or adverse reactions: No Arrival Time: 10:19 Had a fall or experienced change in No Accompanied By: self activities of daily living that may affect Transfer Assistance: None risk of falls: Patient Identification Verified: Yes Hospitalized since last visit: No Secondary Verification Process Completed: Yes Has Dressing in Place as Prescribed: Yes Patient Requires Transmission-Based Precautions: No Pain Present Now: No Patient Has Alerts: No Electronic Signature(s) Signed: 11/30/2022 4:56:13 PM By: Rosalio Loud MSN RN CNS WTA Entered By: Rosalio Loud on 11/30/2022 10:25:36 -------------------------------------------------------------------------------- Clinic Level of Care Assessment Details Patient Name: Date of Service: [name] 11/30/2022 10:00 A M Medical Record Number: [id number] Patient Account Number: [id number] Date of Birth/Sex: Treating RN: 10/23/1966 (56 y.o. Seward Meth Primary Care Arna Luis: PA TIENT, NO Other Clinician: Referring Woodrow Drab: Treating Jerrica Thorman/Extender: RO BSO N, MICHA EL Randa Ngo in Treatment: 24 Clinic Level of Care Assessment Items TOOL 4 Quantity Score X- 1 0 Use when only an EandM is performed on FOLLOW-UP visit ASSESSMENTS - Nursing Assessment / Reassessment X- 1 10 Reassessment of Co-morbidities (includes  updates in patient status) X- 1 5 Reassessment of Adherence to Treatment Plan ASSESSMENTS - Wound and Skin A ssessment / Reassessment X - Simple Wound Assessment / Reassessment - one wound 1 5 _0  - 0 Complex Wound Assessment / Reassessment - multiple wounds Record, Abas ([id number]) [id number] Page 2 of 8 _1  - 0 Dermatologic / Skin Assessment (not related to wound area) ASSESSMENTS - Focused Assessment _2  - 0 Circumferential Edema Measurements - multi extremities _3  - 0 Nutritional Assessment / Counseling / Intervention _4  - 0 Lower Extremity Assessment (monofilament, tuning fork, pulses) _5  - 0 Peripheral Arterial Disease Assessment (using hand held doppler) ASSESSMENTS - Ostomy and/or Continence Assessment and Care _6  - 0 Incontinence Assessment and Management _7  - 0 Ostomy Care Assessment and Management (repouching, etc.) PROCESS - Coordination of Care X - Simple Patient / Family Education for ongoing care 1 15 _8  - 0 Complex (extensive) Patient / Family Education for ongoing care X- 1 10 Staff obtains Programmer, systems, Records, T Results / Process Orders est _9  - 0 Staff telephones HHA, Nursing Homes / Clarify orders / etc _10  - 0 Routine Transfer to another Facility (non-emergent condition) _11  - 0 Routine Hospital Admission (non-emergent condition) _12  - 0 New Admissions / Biomedical engineer / Ordering NPWT Apligraf, etc. , _13  - 0 Emergency Hospital Admission (emergent condition) X- 1 10 Simple Discharge Coordination _14  - 0 Complex (extensive) Discharge Coordination PROCESS - Special Needs _15  - 0 Pediatric / Minor Patient Management _16  - 0 Isolation Patient Management _17  - 0 Hearing / Language / Visual special needs _18  - 0 Assessment of Community assistance (transportation, D/C planning, etc.) _19  - 0 Additional assistance / Altered mentation _20  - 0 Support Surface(s) Assessment (bed, cushion, seat, etc.) INTERVENTIONS - Wound  Cleansing / Measurement X - Simple Wound Cleansing - one wound 1 5 _21  - 0 Complex Wound Cleansing - multiple wounds X- 1 5  Wound Imaging (photographs - any number of wounds) _0  - 0 Wound Tracing (instead of photographs) X- 1 5 Simple Wound Measurement - one wound _1  - 0 Complex Wound Measurement - multiple wounds INTERVENTIONS - Wound Dressings _2  - 0 Small Wound Dressing one or multiple wounds X- 1 15 Medium Wound Dressing one or multiple wounds _3  - 0 Large Wound Dressing one or multiple wounds <CBULAGTXMIWOEHOZ>_2<\/YQMGNOIBBCWUGQBV>_6  - 0 Application of Medications - topical <XIHWTUUEKCMKLKJZ>_7<\/HXTAVWPVXYIAXKPV>_3  - 0 Application of Medications - injection INTERVENTIONS - Miscellaneous _6  - 0 External ear exam _7  - 0 Specimen Collection (cultures, biopsies, blood, body fluids, etc.) _8  - 0 Specimen(s) / Culture(s) sent or taken to Lab for analysis [name], [name] ([id number]) [id number] Page 3 of 8 _9  - 0 Patient Transfer (multiple staff / Civil Service fast streamer / Similar devices) _10  - 0 Simple Staple / Suture removal (25 or less) _11  - 0 Complex Staple / Suture removal (26 or more) _12  - 0 Hypo / Hyperglycemic Management (close monitor of Blood Glucose) _13  - 0 Ankle / Brachial Index (ABI) - do not check if billed separately X- 1 5 Vital Signs Has the patient been seen at the hospital within the last three years: Yes Total Score: 90 Level Of Care: New/Established - Level 3 Electronic Signature(s) Signed: 11/30/2022 4:56:13 PM By: Rosalio Loud MSN RN CNS WTA Entered By: Rosalio Loud on 11/30/2022 11:16:20 -------------------------------------------------------------------------------- Encounter Discharge Information Details Patient Name: Date of Service: [name], [name] 11/30/2022 10:00 A M Medical Record Number: [id number] Patient Account Number: [id number] Date of Birth/Sex: Treating RN: 10-26-1966 (56 y.o. Seward Meth Primary Care Suan Pyeatt: PA Haig Prophet, NO Other Clinician: Referring Letanya Froh: Treating Ion Gonnella/Extender: RO  BSO N, MICHA EL Randa Ngo in Treatment: 24 Encounter Discharge Information Items Post Procedure Vitals Discharge Condition: Stable Temperature (F): 98.4 Ambulatory Status: Knee Scooter Pulse (bpm): 94 Discharge Destination: Home Respiratory Rate (breaths/min): 16 Transportation: Private Auto Blood Pressure (mmHg): 151/98 Accompanied By: self Schedule Follow-up Appointment: Yes Clinical Summary of Care: Electronic Signature(s) Signed: 11/30/2022 4:22:04 PM By: Rosalio Loud MSN RN CNS WTA Entered By: Rosalio Loud on 11/30/2022 16:22:04 -------------------------------------------------------------------------------- Lower Extremity Assessment Details Patient Name: Date of Service: Matthew [name], [name] 11/30/2022 10:00 A M Medical Record Number: [id number] Patient Account Number: [id number] Date of Birth/Sex: Treating RN: Oct 01, 1966 (56 y.o. Seward Meth Primary Care Lativia Velie: PA Darnelle Spangle Other Clinician: Referring Christene Pounds: Treating Jenavive Lamboy/Extender: [address], [place], [place], [place] ([id number]) [phone].pdf Page 4 of 8 Weeks in Treatment: 24 Electronic Signature(s) Signed: 11/30/2022 4:56:13 PM By: Rosalio Loud MSN RN CNS WTA Entered By: Rosalio Loud on 11/30/2022 11:15:20 -------------------------------------------------------------------------------- Multi-Disciplinary Care Plan Details Patient Name: Date of Service: Matthew [name], [name] 11/30/2022 10:00 A M Medical Record Number: [id number] Patient Account Number: [id number] Date of Birth/Sex: Treating RN: 11-29-1966 (56 y.o. Seward Meth Primary Care Moussa Wiegand: PA Haig Prophet, NO Other Clinician: Referring Mithra Spano: Treating Pandora Mccrackin/Extender: RO BSO N, MICHA EL Randa Ngo in Treatment: 24 Active Inactive Osteomyelitis Nursing Diagnoses: Infection: osteomyelitis Knowledge deficit related to disease process and management Potential for infection:  osteomyelitis Goals: Diagnostic evaluation for osteomyelitis completed as ordered Date Initiated: 06/28/2022 Date Inactivated: 08/03/2022 Target Resolution Date: 06/28/2022 Goal Status: Met Patient/caregiver will verbalize understanding of disease process and disease management Date Initiated: 06/28/2022 Date Inactivated: 08/31/2022 Target Resolution Date: 06/28/2022 Goal Status: Met Patient's osteomyelitis will resolve Date Initiated: 06/28/2022 Target Resolution Date: 12/17/2022 Goal Status: Active Signs and symptoms for osteomyelitis will be recognized and promptly addressed Date  Initiated: 06/28/2022 Date Inactivated: 08/03/2022 Target Resolution Date: 06/28/2022 Goal Status: Met Interventions: Assess for signs and symptoms of osteomyelitis resolution every visit Provide education on osteomyelitis Screen for HBO Notes: Electronic Signature(s) Signed: 11/30/2022 4:56:13 PM By: Rosalio Loud MSN RN CNS WTA Entered By: Rosalio Loud on 11/30/2022 11:16:52 [place], [place] ([id number]) [id number] Page 5 of 8 -------------------------------------------------------------------------------- Pain Assessment Details Patient Name: Date of Service: [name], [name] 11/30/2022 10:00 A M Medical Record Number: [id number] Patient Account Number: [id number] Date of Birth/Sex: Treating RN: 1966-03-08 (56 y.o. Seward Meth Primary Care Hall Birchard: PA Haig Prophet, NO Other Clinician: Referring [place] Marchiano: Treating Damir Leung/Extender: RO BSO N, MICHA EL Philemon Kingdom Weeks in Treatment: 24 Active Problems Location of Pain Severity and Description of Pain Patient Has Paino No Site Locations Pain Management and Medication Current Pain Management: Electronic Signature(s) Signed: 11/30/2022 4:56:13 PM By: Rosalio Loud MSN RN CNS WTA Entered By: Rosalio Loud on 11/30/2022 10:27:55 -------------------------------------------------------------------------------- Patient/Caregiver  Education Details Patient Name: Date of Service: Matthew [name], [name] 12/28/2023andnbsp10:00 A M Medical Record Number: [id number] Patient Account Number: [id number] Date of Birth/Gender: Treating RN: 11/03/1966 (56 y.o. Seward Meth Primary Care Physician: PA Haig Prophet, NO Other Clinician: Referring Physician: Treating Physician/Extender: RO BSO N, MICHA EL Randa Ngo in Treatment: [place], [place] ([id number]) [id number] Page 6 of 8 Education Assessment Education Provided To: Patient Education Topics Provided Wound/Skin Impairment: Handouts: Caring for Your Ulcer Methods: Explain/Verbal Responses: State content correctly Electronic Signature(s) Signed: 11/30/2022 4:56:13 PM By: Rosalio Loud MSN RN CNS WTA Entered By: Rosalio Loud on 11/30/2022 11:16:46 -------------------------------------------------------------------------------- Wound Assessment Details Patient Name: Date of Service: Matthew [name], [name] 11/30/2022 10:00 A M Medical Record Number: [id number] Patient Account Number: [id number] Date of Birth/Sex: Treating RN: 1966/06/01 (56 y.o. Seward Meth Primary Care Inna Tisdell: PA TIENT, NO Other Clinician: Referring Shaliyah Taite: Treating Matilynn Dacey/Extender: RO BSO N, MICHA EL Philemon Kingdom Weeks in Treatment: 24 Wound Status Wound Number: 1 Primary Etiology: Diabetic Wound/Ulcer of the Lower Extremity Wound Location: Left, Plantar Foot Wound Status: Open Wounding Event: Gradually Appeared Comorbid History: Type II Diabetes Date Acquired: 12/04/2021 Weeks Of Treatment: 24 Clustered Wound: No Pending Amputation On Presentation Photos Wound Measurements Length: (cm) 6.3 Width: (cm) 6.4 Depth: (cm) 0.2 Area: (cm) 31.667 Volume: (cm) 6.333 % Reduction in Area: 68.5% % Reduction in Volume: 87.4% Epithelialization: None Wound Description Classification: Grade 4 Exudate Amount: Medium Ditton, Davyd ([id number]) Exudate Type:  Serosanguineous Exudate Color: red, brown Foul Odor After Cleansing: No Slough/Fibrino Yes [id number] Page 7 of 8 Wound Bed Granulation Amount: Medium (34-66%) Exposed Structure Granulation Quality: Pink, Hyper-granulation Fascia Exposed: No Necrotic Amount: Medium (34-66%) Fat Layer (Subcutaneous Tissue) Exposed: Yes Necrotic Quality: Adherent Slough Tendon Exposed: No Muscle Exposed: No Joint Exposed: No Bone Exposed: No Treatment Notes Wound #1 (Foot) Wound Laterality: Plantar, Left Cleanser Peri-Wound Care Topical Primary Dressing Secondary Dressing ABD Pad 5x9 (in/in) Discharge Instruction: Cover with ABD pad Kerlix 4.5 x 4.1 (in/yd) Discharge Instruction: Apply Kerlix 4.5 x 4.1 (in/yd) as instructed Secured With Medipore T - 2M Medipore H Soft Cloth Surgical T ape ape, 2x2 (in/yd) Compression Wrap Compression Stockings Add-Ons Electronic Signature(s) Signed: 11/30/2022 4:56:13 PM By: Rosalio Loud MSN RN CNS WTA Entered By: Rosalio Loud on 11/30/2022 10:35:56 -------------------------------------------------------------------------------- Vitals Details Patient Name: Date of Service: Matthew [name], [name] 11/30/2022 10:00 A M Medical Record Number: [id number] Patient Account Number: [id number] Date of Birth/Sex: Treating RN: Apr 01, 1966 (56 y.o. Seward Meth Primary Care Sunshyne Horvath: PA Haig Prophet, NO Other Clinician:  Referring Qusai Kem: Treating [place] Splinter/Extender: RO BSO N, MICHA EL Philemon Kingdom Weeks in Treatment: 24 Vital Signs Time Taken: 10:25 Temperature (F): 98.4 Height (in): 70 Pulse (bpm): 94 Weight (lbs): 230 Respiratory Rate (breaths/min): 16 Body Mass Index (BMI): 33 Blood Pressure (mmHg): 151/98 Reference Range: 80 - 120 mg / dl Electronic Signature(s) Signed: 11/30/2022 4:56:13 PM By: Rosalio Loud MSN RN CNS Lowanda Foster, [place] ([id number]) 3:52:48 PM By: Rosalio Loud MSN RN CNS WTA [phone].pdf Page  8 of 8 Signed: 11/30/2022 Entered By: Rosalio Loud on 11/30/2022 10:27:49

## 2022-12-07 ENCOUNTER — Ambulatory Visit (AMBULATORY_SURGERY_CENTER): Payer: Self-pay | Admitting: *Deleted

## 2022-12-07 VITALS — Ht 70.0 in | Wt 254.4 lb

## 2022-12-07 DIAGNOSIS — Z1211 Encounter for screening for malignant neoplasm of colon: Secondary | ICD-10-CM

## 2022-12-07 MED ORDER — NA SULFATE-K SULFATE-MG SULF 17.5-3.13-1.6 GM/177ML PO SOLN: 1.0000 | Freq: Once | ORAL | 0 refills | Status: AC

## 2022-12-07 NOTE — Progress Notes (Signed)

## 2022-12-12 ENCOUNTER — Encounter: Payer: Self-pay | Admitting: Family Medicine

## 2022-12-12 ENCOUNTER — Other Ambulatory Visit: Payer: Self-pay

## 2022-12-12 ENCOUNTER — Ambulatory Visit: Payer: 59 | Attending: Family Medicine | Admitting: Family Medicine

## 2022-12-12 VITALS — BP 143/89 | HR 80 | Temp 98.2°F | Ht 71.0 in | Wt 262.0 lb

## 2022-12-12 DIAGNOSIS — I152 Hypertension secondary to endocrine disorders: Secondary | ICD-10-CM | POA: Diagnosis not present

## 2022-12-12 DIAGNOSIS — Z794 Long term (current) use of insulin: Secondary | ICD-10-CM | POA: Diagnosis not present

## 2022-12-12 DIAGNOSIS — L089 Local infection of the skin and subcutaneous tissue, unspecified: Secondary | ICD-10-CM

## 2022-12-12 DIAGNOSIS — E1169 Type 2 diabetes mellitus with other specified complication: Secondary | ICD-10-CM

## 2022-12-12 DIAGNOSIS — M869 Osteomyelitis, unspecified: Secondary | ICD-10-CM

## 2022-12-12 DIAGNOSIS — E1159 Type 2 diabetes mellitus with other circulatory complications: Secondary | ICD-10-CM

## 2022-12-12 DIAGNOSIS — Z9189 Other specified personal risk factors, not elsewhere classified: Secondary | ICD-10-CM

## 2022-12-12 DIAGNOSIS — E11628 Type 2 diabetes mellitus with other skin complications: Secondary | ICD-10-CM | POA: Diagnosis not present

## 2022-12-12 LAB — POCT GLYCOSYLATED HEMOGLOBIN (HGB A1C): HbA1c, POC (controlled diabetic range): 10.3 % — AB (ref 0.0–7.0)

## 2022-12-12 LAB — GLUCOSE, POCT (MANUAL RESULT ENTRY): POC Glucose: 267 mg/dl — AB (ref 70–99)

## 2022-12-12 MED ORDER — TRULICITY 1.5 MG/0.5ML ~~LOC~~ SOAJ
1.5000 mg | SUBCUTANEOUS | 6 refills | Status: DC
  Filled 2022-12-12: qty 2, 28d supply, fill #0
  Filled 2023-01-17: qty 2, 28d supply, fill #1

## 2022-12-12 MED ORDER — ATORVASTATIN CALCIUM 20 MG PO TABS
20.0000 mg | ORAL_TABLET | Freq: Every day | ORAL | 1 refills | Status: DC
  Filled 2022-12-12: qty 30, 30d supply, fill #0

## 2022-12-12 MED ORDER — METFORMIN HCL ER 500 MG PO TB24
1000.0000 mg | ORAL_TABLET | Freq: Two times a day (BID) | ORAL | 6 refills | Status: DC
  Filled 2022-12-12: qty 120, 30d supply, fill #0

## 2022-12-12 MED ORDER — AMLODIPINE BESYLATE 5 MG PO TABS
5.0000 mg | ORAL_TABLET | Freq: Every day | ORAL | 1 refills | Status: DC
  Filled 2022-12-12: qty 30, 30d supply, fill #0

## 2022-12-12 MED ORDER — HUMULIN N KWIKPEN 100 UNIT/ML ~~LOC~~ SUPN
22.0000 [IU] | PEN_INJECTOR | Freq: Two times a day (BID) | SUBCUTANEOUS | 5 refills | Status: DC
  Filled 2022-12-12: qty 12, 27d supply, fill #0

## 2022-12-12 NOTE — Progress Notes (Signed)
F/u DM Not taking Ozempic or Trulicity Needs a note to carry to SSI for disability

## 2022-12-12 NOTE — Progress Notes (Addendum)
Subjective:  Patient ID: [name], male    DOB: 01-Oct-1966  Age: 57 y.o. MRN: [id number]  CC: Diabetes   HPI [name] is a 57 y.o. year old male with a history of type 2 diabetes mellitus (A1c 10.3), osteomyelitis of left foot, left foot diabetic ulcer (status post surgical debridement), wound culture positive for MRSA    Interval History:  He has not been taking Trulicity or Ozempic. He had been out of Trulicity for 1 month.  Endorses adherence with metformin and his Humulin.  Denies presence of hypoglycemia or visual concerns but he does have neuropathy in his hands and feet. Blood pressure is elevated and he has no known history of hypertension.  At his last office visit his blood pressure was above goal.  He is currently not on any medication.  10 days ago he had a visit with infectious disease for follow-up of his left diabetic foot infection and he is also seeing the wound care clinic.   Per notes amputation recommended as he does have osteomyelitis however he wanted to save his foot. He attends the wound care clinic once/week and performs daily dressings at home. MRI from 06/29/2022: IMPRESSION: 1. Severe diffuse cellulitis and myofasciitis. 2. Septic arthritis involving the second, third and fourth MTP joints with associated osteomyelitis involving the metatarsals and proximal phalanges. There may be early involvement of the first MTP joint also. 3. Osteomyelitis involving the sesamoids of the great toe.    He states he has been out of work x1 year and has lost his truck.  His family currently provides support for him financially.  He is applying for disability and would like a letter indicating his medical conditions and inability to work. Past Medical History:  Diagnosis Date   Diabetes mellitus without complication ([place])    Tuberculosis    tested positive,treated with medications.    Past Surgical History:  Procedure Laterality Date   FOOT SURGERY Left    I & D  EXTREMITY Left 05/05/2022   Procedure: LEFT FOOT DEBRIDEMENT;  Surgeon: Newt Minion, MD;  Location: [facility];  Service: Orthopedics;  Laterality: Left;   MYRINGOTOMY WITH TUBE PLACEMENT Bilateral 07/20/2022   Procedure: MYRINGOTOMY WITH TUBE PLACEMENT;  Surgeon: Margaretha Sheffield, MD;  Location: [facility];  Service: ENT;  Laterality: Bilateral;  Diabetic   TONSILLECTOMY      Family History  Problem Relation Age of Onset   Colon cancer Neg Hx    Colon polyps Neg Hx    Crohn's disease Neg Hx    Esophageal cancer Neg Hx    Rectal cancer Neg Hx    Stomach cancer Neg Hx    Ulcerative colitis Neg Hx     Social History   Socioeconomic History   Marital status: Single    Spouse name: Not on file   Number of children: Not on file   Years of education: Not on file   Highest education level: Not on file  Occupational History   Not on file  Tobacco Use   Smoking status: Former    Packs/day: 0.25    Years: 25.00    Total pack years: 6.25    Types: Cigarettes    Quit date: 09/17/2022    Years since quitting: 0.2   Smokeless tobacco: Never   Tobacco comments:    Cutting back - goes through a pack in 4-6 days (Started around age 81)  Vaping Use   Vaping Use: Never used  Substance  and Sexual Activity   Alcohol use: No   Drug use: No   Sexual activity: Yes  Other Topics Concern   Not on file  Social History Narrative   Not on file   Social Determinants of Health   Financial Resource Strain: Not on file  Food Insecurity: Not on file  Transportation Needs: Not on file  Physical Activity: Not on file  Stress: Not on file  Social Connections: Not on file    No Known Allergies  Outpatient Medications Prior to Visit  Medication Sig Dispense Refill   Dulaglutide (TRULICITY) 1.5 MG/0.5ML SOPN Inject 1.5 mg into the skin once a week. 6 mL 1   Insulin NPH, Human,, Isophane, (HUMULIN N KWIKPEN) 100 UNIT/ML Kiwkpen Inject 20 Units into the skin 2 (two) times daily. 30 mL 5    metFORMIN (GLUCOPHAGE-XR) 500 MG 24 hr tablet Take 1 tablet (500 mg total) by mouth 2 (two) times daily with a meal. After 1 week, increase to 2 tablets (1000mg ) 2 times daily if tolerable. 120 tablet 2   acetaminophen (TYLENOL) 500 MG tablet Take 2,000 mg by mouth every 6 (six) hours as needed for mild pain or moderate pain. (Patient not taking: Reported on 12/07/2022)     Continuous Blood Gluc Transmit (DEXCOM G6 TRANSMITTER) MISC Use to check blood sugar three times daily. Change transmitter once every 909 days. E11.69 (Patient not taking: Reported on 12/07/2022) 1 each 1   gabapentin (NEURONTIN) 300 MG capsule Take 1 capsule (300 mg total) by mouth at bedtime. (Patient not taking: Reported on 12/07/2022) 30 capsule 3   Insulin Pen Needle 32G X 4 MM MISC Use as directed to inject insulin up to 4 times daily. (Patient not taking: Reported on 12/12/2022) 100 each 0   methocarbamol (ROBAXIN) 500 MG tablet Take 1 tablet (500 mg total) by mouth every 6 (six) hours as needed for muscle spasms. (Patient not taking: Reported on 12/07/2022) 30 tablet 0   atorvastatin (LIPITOR) 20 MG tablet Take 1 tablet (20 mg total) by mouth daily. (Patient not taking: Reported on 12/07/2022) 90 tablet 1   Semaglutide,0.25 or 0.5MG /DOS, (OZEMPIC, 0.25 OR 0.5 MG/DOSE,) 2 MG/1.5ML SOPN Inject into the skin. (Patient not taking: Reported on 12/07/2022)     No facility-administered medications prior to visit.     ROS Review of Systems  Constitutional:  Negative for activity change and appetite change.  HENT:  Negative for sinus pressure and sore throat.   Respiratory:  Negative for chest tightness, shortness of breath and wheezing.   Cardiovascular:  Negative for chest pain and palpitations.  Gastrointestinal:  Negative for abdominal distention, abdominal pain and constipation.  Genitourinary: Negative.   Musculoskeletal: Negative.   Skin:  Positive for wound.  Neurological:  Positive for numbness.  Psychiatric/Behavioral:   Negative for behavioral problems and dysphoric mood.     Objective:  BP (!) 143/89 (BP Location: Left Arm, Patient Position: Sitting, Cuff Size: Large)   Pulse 80   Temp 98.2 F (36.8 C) (Oral)   Ht 5\' 11"  (1.803 m)   Wt 262 lb (118.8 kg)   SpO2 97%   BMI 36.54 kg/m      12/12/2022    3:30 PM 12/12/2022    3:11 PM 12/07/2022   11:00 AM  BP/Weight  Systolic BP 143 160   Diastolic BP 89 94   Wt. (Lbs)  262 254.4  BMI  36.54 kg/m2 36.5 kg/m2      Physical Exam Constitutional:  Appearance: He is well-developed.  Cardiovascular:     Rate and Rhythm: Normal rate.     Heart sounds: Normal heart sounds. No murmur heard. Pulmonary:     Effort: Pulmonary effort is normal.     Breath sounds: Normal breath sounds. No wheezing or rales.  Chest:     Chest wall: No tenderness.  Abdominal:     General: Bowel sounds are normal. There is no distension.     Palpations: Abdomen is soft. There is no mass.     Tenderness: There is no abdominal tenderness.  Musculoskeletal:        General: Normal range of motion.     Right lower leg: No edema.     Left lower leg: Edema present.     Comments: Left foot wrapped in Ace wrap  Neurological:     Mental Status: He is alert and oriented to person, place, and time.  Psychiatric:        Mood and Affect: Mood normal.        Latest Ref Rng & Units 10/31/2022    9:28 AM 08/01/2022   10:22 AM 06/20/2022   10:47 AM  CMP  Glucose 70 - 99 mg/dL 416  606  301   BUN 6 - 20 mg/dL 15  20  23    Creatinine 0.61 - 1.24 mg/dL  6.01  0.93   Sodium 135 - 145 mmol/L 137  142  141   Potassium 3.5 - 5.1 mmol/L 4.1  4.2  4.9   Chloride 98 - 111 mmol/L 102  109  106   CO2 22 - 32 mmol/L 28  28  31    Calcium 8.9 - 10.3 mg/dL 8.9  8.9  8.8   Total Protein 6.5 - 8.1 g/dL 7.3  7.2  6.6   Total Bilirubin 0.3 - 1.2 mg/dL 0.4  0.3  0.2   Alkaline Phos 38 - 126 U/L 72  61    AST 15 - 41 U/L 14  15  13    ALT 0 - 44 U/L 12  16  9      Lipid Panel      Component Value Date/Time   CHOL 101 12/14/2021 1239   TRIG 134 12/14/2021 1239   HDL 29 (L) 12/14/2021 1239   CHOLHDL 3.5 12/14/2021 1239   VLDL 27 12/14/2021 1239   LDLCALC 45 12/14/2021 1239    CBC    Component Value Date/Time   WBC 8.2 10/31/2022 0928   RBC 4.68 10/31/2022 0928   HGB 12.1 (L) 10/31/2022 0928   HCT 37.7 (L) 10/31/2022 0928   PLT 388 10/31/2022 0928   MCV 80.6 10/31/2022 0928   MCH 25.9 (L) 10/31/2022 0928   MCHC 32.1 10/31/2022 0928   RDW 14.3 10/31/2022 0928   LYMPHSABS 2.9 10/31/2022 0928   MONOABS 0.6 10/31/2022 0928   EOSABS 0.2 10/31/2022 0928   BASOSABS 0.0 10/31/2022 0928    Lab Results  Component Value Date   HGBA1C 10.3 (A) 12/12/2022    Assessment & Plan:  1. Type 2 diabetes mellitus with other specified complication, with long-term current use of insulin (HCC) Uncontrolled with A1c of 10.3 due to medication nonadherence Goal A1c is less than 7.0 I have resent his Trulicity to the pharmacy.  Advised that if he develops GI adverse effects he needs to notify me and I will restart his dose at a starting dose given he has been out of this for 1 month Will recheck A1c at  next visit Counseled on Diabetic diet, my plate method, 092 minutes of moderate intensity exercise/week Blood sugar logs with fasting goals of 80-120 mg/dl, random of less than 330 and in the event of sugars less than 60 mg/dl or greater than 076 mg/dl encouraged to notify the clinic. Advised on the need for annual eye exams, annual foot exams, Pneumonia vaccine.  - HgB A1c - Glucose (CBG) - Dulaglutide (TRULICITY) 1.5 MG/0.5ML SOPN; Inject 1.5 mg into the skin once a week.  Dispense: 6 mL; Refill: 6 - metFORMIN (GLUCOPHAGE-XR) 500 MG 24 hr tablet; Take 2 tablets (1,000 mg total) by mouth 2 (two) times daily with a meal.  Dispense: 120 tablet; Refill: 6 - Insulin NPH, Human,, Isophane, (HUMULIN N KWIKPEN) 100 UNIT/ML Kiwkpen; Inject 22 Units into the skin 2 (two) times daily.   Dispense: 30 mL; Refill: 5  2. At high risk for cardiovascular disease Last lipid panel was normal He is due for repeat Low-cholesterol diet - atorvastatin (LIPITOR) 20 MG tablet; Take 1 tablet (20 mg total) by mouth daily.  Dispense: 90 tablet; Refill: 1  3. Hypertension associated with diabetes (HCC) New diagnosis Will initiate amlodipine Counseled on blood pressure goal of less than 130/80, low-sodium, DASH diet, medication compliance, 150 minutes of moderate intensity exercise per week. Discussed medication compliance, adverse effects. - amLODipine (NORVASC) 5 MG tablet; Take 1 tablet (5 mg total) by mouth daily.  Dispense: 90 tablet; Refill: 1  4. Toe osteomyelitis, left (HCC) Evident from MRI from 06/2022 Completed course of antibiotics and was seen by infectious disease last month  5.  Diabetic foot infection Currently under the care of wound clinic Continue weekly follow-up with wound care and daily dressing changes at home Advised that glycemic control is imperative with regards to wound healing  Health Care Maintenance: has upcoming appointment for Colonoscopy on 12/26/22 Meds ordered this encounter  Medications   Dulaglutide (TRULICITY) 1.5 MG/0.5ML SOPN    Sig: Inject 1.5 mg into the skin once a week.    Dispense:  6 mL    Refill:  6   metFORMIN (GLUCOPHAGE-XR) 500 MG 24 hr tablet    Sig: Take 2 tablets (1,000 mg total) by mouth 2 (two) times daily with a meal.    Dispense:  120 tablet    Refill:  6   Insulin NPH, Human,, Isophane, (HUMULIN N KWIKPEN) 100 UNIT/ML Kiwkpen    Sig: Inject 22 Units into the skin 2 (two) times daily.    Dispense:  30 mL    Refill:  5   atorvastatin (LIPITOR) 20 MG tablet    Sig: Take 1 tablet (20 mg total) by mouth daily.    Dispense:  90 tablet    Refill:  1   amLODipine (NORVASC) 5 MG tablet    Sig: Take 1 tablet (5 mg total) by mouth daily.    Dispense:  90 tablet    Refill:  1    Follow-up: Return in about 3 months (around  03/13/2023) for Chronic medical conditions.       [name], MD, FAAFP. [facility] [place], [place] [phone]   12/12/2022, 5:59 PM

## 2022-12-12 NOTE — Patient Instructions (Signed)
Managing Your Hypertension Hypertension, also called high blood pressure, is when the force of the blood pressing against the walls of the arteries is too strong. Arteries are blood vessels that carry blood from your heart throughout your body. Hypertension forces the heart to work harder to pump blood and may cause the arteries to become narrow or stiff. Understanding blood pressure readings A blood pressure reading includes a higher number over a lower number: The first, or top, number is called the systolic pressure. It is a measure of the pressure in your arteries as your heart beats. The second, or bottom number, is called the diastolic pressure. It is a measure of the pressure in your arteries as the heart relaxes. For most people, a normal blood pressure is below 120/80. Your personal target blood pressure may vary depending on your medical conditions, your age, and other factors. Blood pressure is classified into four stages. Based on your blood pressure reading, your health care provider may use the following stages to determine what type of treatment you need, if any. Systolic pressure and diastolic pressure are measured in a unit called millimeters of mercury (mmHg). Normal Systolic pressure: below 120. Diastolic pressure: below 80. Elevated Systolic pressure: 120-129. Diastolic pressure: below 80. Hypertension stage 1 Systolic pressure: 130-139. Diastolic pressure: 80-89. Hypertension stage 2 Systolic pressure: 140 or above. Diastolic pressure: 90 or above. How can this condition affect me? Managing your hypertension is very important. Over time, hypertension can damage the arteries and decrease blood flow to parts of the body, including the brain, heart, and kidneys. Having untreated or uncontrolled hypertension can lead to: A heart attack. A stroke. A weakened blood vessel (aneurysm). Heart failure. Kidney damage. Eye damage. Memory and concentration problems. Vascular  dementia. What actions can I take to manage this condition? Hypertension can be managed by making lifestyle changes and possibly by taking medicines. Your health care provider will help you make a plan to bring your blood pressure within a normal range. You may be referred for counseling on a healthy diet and physical activity. Nutrition  Eat a diet that is high in fiber and potassium, and low in salt (sodium), added sugar, and fat. An example eating plan is called the DASH diet. DASH stands for Dietary Approaches to Stop Hypertension. To eat this way: Eat plenty of fresh fruits and vegetables. Try to fill one-half of your plate at each meal with fruits and vegetables. Eat whole grains, such as whole-wheat pasta, brown rice, or whole-grain bread. Fill about one-fourth of your plate with whole grains. Eat low-fat dairy products. Avoid fatty cuts of meat, processed or cured meats, and poultry with skin. Fill about one-fourth of your plate with lean proteins such as fish, chicken without skin, beans, eggs, and tofu. Avoid pre-made and processed foods. These tend to be higher in sodium, added sugar, and fat. Reduce your daily sodium intake. Many people with hypertension should eat less than 1,500 mg of sodium a day. Lifestyle  Work with your health care provider to maintain a healthy body weight or to lose weight. Ask what an ideal weight is for you. Get at least 30 minutes of exercise that causes your heart to beat faster (aerobic exercise) most days of the week. Activities may include walking, swimming, or biking. Include exercise to strengthen your muscles (resistance exercise), such as weight lifting, as part of your weekly exercise routine. Try to do these types of exercises for 30 minutes at least 3 days a week. Do   not use any products that contain nicotine or tobacco. These products include cigarettes, chewing tobacco, and vaping devices, such as e-cigarettes. If you need help quitting, ask your  health care provider. Control any long-term (chronic) conditions you have, such as high cholesterol or diabetes. Identify your sources of stress and find ways to manage stress. This may include meditation, deep breathing, or making time for fun activities. Alcohol use Do not drink alcohol if: Your health care provider tells you not to drink. You are pregnant, may be pregnant, or are planning to become pregnant. If you drink alcohol: Limit how much you have to: 0-1 drink a day for women. 0-2 drinks a day for men. Know how much alcohol is in your drink. In the [place] one drink equals one 12 oz bottle of beer (355 mL), one 5 oz glass of wine (148 mL), or one 1 oz glass of hard liquor (44 mL). Medicines Your health care provider may prescribe medicine if lifestyle changes are not enough to get your blood pressure under control and if: Your systolic blood pressure is 130 or higher. Your diastolic blood pressure is 80 or higher. Take medicines only as told by your health care provider. Follow the directions carefully. Blood pressure medicines must be taken as told by your health care provider. The medicine does not work as well when you skip doses. Skipping doses also puts you at risk for problems. Monitoring Before you monitor your blood pressure: Do not smoke, drink caffeinated beverages, or exercise within 30 minutes before taking a measurement. Use the bathroom and empty your bladder (urinate). Sit quietly for at least 5 minutes before taking measurements. Monitor your blood pressure at home as told by your health care provider. To do this: Sit with your back straight and supported. Place your feet flat on the floor. Do not cross your legs. Support your arm on a flat surface, such as a table. Make sure your upper arm is at heart level. Each time you measure, take two or three readings one minute apart and record the results. You may also need to have your blood pressure checked regularly by  your health care provider. General information Talk with your health care provider about your diet, exercise habits, and other lifestyle factors that may be contributing to hypertension. Review all the medicines you take with your health care provider because there may be side effects or interactions. Keep all follow-up visits. Your health care provider can help you create and adjust your plan for managing your high blood pressure. Where to find more information National Heart, Lung, and Blood Institute: www.nhlbi.nih.gov American Heart Association: www.heart.org Contact a health care provider if: You think you are having a reaction to medicines you have taken. You have repeated (recurrent) headaches. You feel dizzy. You have swelling in your ankles. You have trouble with your vision. Get help right away if: You develop a severe headache or confusion. You have unusual weakness or numbness, or you feel faint. You have severe pain in your chest or abdomen. You vomit repeatedly. You have trouble breathing. These symptoms may be an emergency. Get help right away. Call 911. Do not wait to see if the symptoms will go away. Do not drive yourself to the hospital. Summary Hypertension is when the force of blood pumping through your arteries is too strong. If this condition is not controlled, it may put you at risk for serious complications. Your personal target blood pressure may vary depending on your medical conditions,   your age, and other factors. For most people, a normal blood pressure is less than 120/80. Hypertension is managed by lifestyle changes, medicines, or both. Lifestyle changes to help manage hypertension include losing weight, eating a healthy, low-sodium diet, exercising more, stopping smoking, and limiting alcohol. This information is not intended to replace advice given to you by your health care provider. Make sure you discuss any questions you have with your health care  provider. Document Revised: 08/04/2021 Document Reviewed: 08/04/2021 Elsevier Patient Education  2023 [employer]  

## 2022-12-13 ENCOUNTER — Other Ambulatory Visit: Payer: Self-pay

## 2022-12-14 ENCOUNTER — Ambulatory Visit: Payer: 59 | Admitting: Physician Assistant

## 2022-12-22 ENCOUNTER — Encounter: Payer: 59 | Attending: Physician Assistant | Admitting: Physician Assistant

## 2022-12-22 DIAGNOSIS — M86672 Other chronic osteomyelitis, left ankle and foot: Secondary | ICD-10-CM | POA: Diagnosis not present

## 2022-12-22 DIAGNOSIS — L97522 Non-pressure chronic ulcer of other part of left foot with fat layer exposed: Secondary | ICD-10-CM | POA: Insufficient documentation

## 2022-12-22 DIAGNOSIS — E11621 Type 2 diabetes mellitus with foot ulcer: Secondary | ICD-10-CM | POA: Insufficient documentation

## 2022-12-22 DIAGNOSIS — E1169 Type 2 diabetes mellitus with other specified complication: Secondary | ICD-10-CM | POA: Diagnosis not present

## 2022-12-22 NOTE — Progress Notes (Addendum)
IRVAN, TIEDT ([id number]) [id number] Page 1 of 10 Visit Report for 12/22/2022 Chief Complaint Document Details Patient Name: Date of Service: [name], [name] 12/22/2022 10:45 A M Medical Record Number: [id number] Patient Account Number: [id number] Date of Birth/Sex: Treating RN: 02/24/1966 (57 y.o. 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 [name]) Yevonne Pax Primary Care Provider: [name] Other Clinician: Referring Provider: Treating Provider/Extender: Bonnye Fava Weeks in Treatment: 27 Debridement Performed for Assessment: Wound #1 Left,Plantar Foot Performed By: Physician Nelida Meuse., PA-C Debridement Type: Debridement Severity of Tissue Pre Debridement: Fat layer exposed Level of Consciousness (Pre-procedure): Awake and Alert Pre-procedure Verification/Time Out Yes - 11:15 Taken: Start Time: 11:15 T Area Debrided (L x W): otal 6 (cm) x 6.5 (cm) = 39 (cm) Tissue and other material debrided: Viable, Non-Viable, Callus, Subcutaneous, Skin: Dermis , Skin: Epidermis Level: Skin/Subcutaneous Tissue Debridement Description: Excisional Instrument: Curette Bleeding: Moderate Hemostasis Achieved: Pressure End Time: 11:24 Procedural Pain: 0 Post Procedural Pain: 0 Response to  Treatment: Procedure was tolerated well [name], [name] ([id number]) [id number] Page 2 of 10 Level of Consciousness (Post- Awake and Alert procedure): Post Debridement Measurements of Total Wound Length: (cm) 6 Width: (cm) 6.5 Depth: (cm) 0.2 Volume: (cm) 6.126 Character of Wound/Ulcer Post Debridement: Improved Severity of Tissue Post Debridement: Fat layer exposed Post Procedure Diagnosis Same as Pre-procedure Electronic Signature(s) Signed: 12/22/2022 12:55:59 PM By: Yevonne Pax RN Signed: 12/22/2022 2:37:27 PM By: Lenda Kelp PA-C Entered By: Yevonne Pax on 12/22/2022 11:21:00 -------------------------------------------------------------------------------- HPI Details Patient Name: Date of Service: [name], [name] 12/22/2022 10:45 A M Medical Record Number: [id number] Patient Account Number: [id number] Date of Birth/Sex: Treating RN: June 21, 1966 (57 y.o. [name] Primary Care Provider: [name] Other Clinician: Referring Provider: Treating Provider/Extender: Laray Anger, Enobong Weeks in Treatment: 27 History of Present Illness HPI Description: Notes from [facility] under Dr. America Brown care:   ADMISSION6/15/2023This is a 57 year old poorly controlled type II diabetic (last A1c 11.5%). In January of this year, he presented to the hospital with an abscess in his left foot. He also had a foreign body present. It was removed in the ER and he was admitted to the hospital for IV antibiotics. He was subsequently discharged on oral antibiotics. Due to financial concerns, he has not taken his diabetic medications (metformin and 70/30 insulin) and as a result, presented back to the emergency room on June 1 with worsening findings, including frank pus and osteomyelitis on MRI. BKA was recommended, but he declined. He was taken to the operating room by Dr. Lajoyce Corners who performed an extensive debridement. Cultures were taken and he  was placed on IV antibiotics. He saw infectious disease while in the hospital who prescribed a 6-week course of IV antibiotics including cefazolin as well as oral metronidazole and levofloxacin. The infectious disease provider also indicated that he would benefit from below-knee amputation, but the patient desired another opinion and therefore he has been referred to wound care center for further evaluation and management. ABIs obtained while he  was in the hospital demonstrate adequate blood flow. Essentially the entire bottom of the patient's left foot has been removed. The heads of the majority of his metatarsal bones are exposed along with their accompanying tendons. Muscle and fat are exposed as well. The wound surface is fairly dry; the patient reports that he was not given any specific wound care instructions other than to place a dry dressing on it after showering. There is no odor or purulent drainage identified. 05/26/2022: The wound measures a little bit smaller today. There is some good granulation tissue beginning to form on the surface. He still has exposed bone and tendon. There is some slough accumulation. He saw infectious disease earlier this week and was frustrated that they recommended amputation. He is interested in another surgical opinion. He continues to smoke but says that he has cut down. He is using a knee scooter for mobility so that he can stay off of his foot. 06/05/2022: He saw Dr. Lajoyce Cornersuda last week who was supportive of our efforts to try to salvage the foot. I referred him to vascular surgery to see Dr. Chestine Sporelark but he has not received an appointment yet. He continues on IV antibiotics. He says that after our conversation last week about him being unable to initiate hyperbarics if he was going to continue to smoke, he has not had a cigarette since. He is now at 4 weeks of conventional management and his insurance was initiated on July 1. Today, the wound continues to show  evidence of improvement. There is good granulation tissue forming. The metatarsals are still exposed but actually have some pink periosteum. There is still some slough and fibrinous exudate present. No odor or purulent drainage. We have just been using Dakin's dressing changes for now. [facility]: 06-15-2022 upon evaluation today patient presents for initial inspection here in our clinic though he has been seen by Dr. Lady Garyannon in [place] up to this point. I did review her notes and records as well they have been using Dakin's moistened gauze dressing which has done a great job at helping to clean out the bottom of his foot. Please see the discourse above for additional information in regard to treatment course up until the patient arrives here in the [place] clinic today. The patient is ready to get started with HBO therapy as soon as possible I do think based on all my review of his records this seems to be appropriate he does have chronic osteomyelitis is also been offered amputation this is obviously a limb salvage operation here. We are trying to prevent him Merrilee JanskyMARTIN, Charleton ([id number]) [id number] Page 3 of 10 from having to undergo an extensive amputation which would likely be a below-knee amputation which in turn is going to affect his quality of life he wants to try to avoid that if at all possible I think hyperbaric oxygen therapy is his best bet to achieve that goal. Currently he has been on IV Ancef. That actually ends tomorrow. Following that according to notes it appears he supposed to be taking Levaquin and Flagyl for an additional period of time although I am not certain exactly how long that with the. He has been seeing regional physicians infectious disease in [place] and again that so I recommended that he contact today in order to find out what the plan is going forward. His most recent hemoglobin A1c as noted above was 11.5 on  05-07-2022 he tells me trying to work on that  he is also quit smoking as of this point. 06-22-2022 upon evaluation today patient appears to be doing well currently in regard to his wound all things considered. He is showing signs of a lot of new granulation tissue and overall very pleased. He is changing this once a day with the Dakin's moistened gauze dressing. Fortunately I do not see any evidence of infection locally or systemically at this time. We have been trying to get him into the hyperbaric chamber he was having a lot of issues with sinus pressure and we have made referral and called multiple ENT specialist the earliest we have been able to get his August 9 at [facility] ear nose [facility]. They are going to try to work him in sooner if they have any opportunities to do so. Obviously I think the sooner he can do this the better. We need to get him in hyperbarics as quickly as possible. 7/26; patient presents for follow-up. He has been using Dakin's wet-to-dry dressings and oil emollient dressing over the exposed bone. He currently denies systemic signs of infection. He states he is scheduled to see infectious disease, Dr Thedore Mins next week. She had ordered an MRI of the left foot and he is scheduled to have this done tomorrow. He currently denies systemic signs of infection Including fever/chills, nausea/vomiting increased warmth or erythema to the wound bed or purulent drainage. He started hyperbaric oxygen treatment however did not tolerate this due to sinus pressure. He is scheduled to see ENT in 2 weeks for evaluation. He is not continuing HBO until he is cleared by ENT. 07-06-2022 patient presents today for follow-up concerning his plantar foot ulcer. The good news is this actually is significantly better compared to previous. Some of the bone which is necrotic is starting to slough off and I am going to perform debridement to clear this away today. Nonetheless I do think that the patient is going to  need hyperbarics and we need to try to get it as soon as possible. He still having a lot of sinus pressure I can even hear the congestion today. He is on 3 different antibiotics cefadroxil, metronidazole, and Levaquin currently for the osteomyelitis. For that reason this should actually help with the infection if he does have a chronic sinusitis I am also going to see about getting him on steroids to try to see if this can benefit him as well. He voiced understanding and he is in agreement with giving that shot. This is probably can to spike his blood sugars which I discussed with him he is going to be seeing his primary care provider later today I want him to let her know as well what is going on and why so that she could be aware but I really do think he needs to take the prednisone. We need to get him in the chamber as soon as possible to try to help with getting this wound to heal and closed so that he does not lose his foot. 07-13-2022 upon evaluation today patient appears to be doing better in regard to his wound he continues to show signs of new granulation am going to perform some debridement today but overall this is doing quite well. 07-20-2022 upon evaluation today patient's wound is actually showing some signs of improvement he does have 1 area on the fifth metatarsal region where there is some necrotic tendon that is noted at this point. Unfortunately this is preventing good granulation and over this point the rest of  the metatarsal region is completely covered over with granulation tissue which is great news there is some other slough and biofilm buildup that I will clearway as well. 07-27-2022 upon evaluation today patient appears to be doing well currently in regard to his plantar foot ulcer. He has been tolerating the dressing changes without complication fortunately. I do believe he is making excellent progress and each time I see him this is better and better is pretty much  completely covered as far as any bone exposure is concerned and he still is on the IV antibiotics were still also undergoing hyperbarics at this point that he just got started and is doing quite well with since getting the tubes in his ears.Marland Kitchen. 08-03-2022 upon evaluation today patient appears to be doing well in general although has been having a lot of drainage compared to normal. It was actually leaking through his dressing today. He tells me he changed it last night. With that being said he has been up on his feet a lot more which I think is probably a big part of the issue here. Fortunately I do not see any signs of infection locally or systemically. 08-17-2022 upon evaluation today patient is doing well with regard to his wound this is actually measuring smaller which is good news. I am very pleased in that regard. With that being said he has not been participating HBO therapy as he really needs to afford to see this heal appropriately and completely. In fact he has only made 9 total HBO treatments in the past 4 weeks. Again this is not therapeutic and I discussed that with the patient. Nonetheless he is still improving and since the hyperbarics is not helping based on the discussion today the patient is going to discontinue HBO therapy at this point. He tells me that getting here 5 days a week just is not possible. He has had difficulty getting here and then also when he was here had difficulty with getting here at the right time which has also been part of the problem. Nonetheless either way I am pleased that he is doing better and my hope is that we will still be able to get him healed obviously he should continue with the antibiotics as well as the wound care as he has been doing this seems to be doing excellent for him but everything is improving quite nicely. 08-24-2022 upon evaluation today patient appears to be doing well currently in regard to his wound this is actually measuring significantly  better even compared to last week. I am very pleased in that regard I do not see any signs of infection and I think he is making excellent progress here. 08-31-2022 upon evaluation today patient's wound is actually showing signs of significant improvement. I am actually very pleased with where things stand currently. There does not appear to be any evidence of active infection locally or systemically at this time. No fevers, chills, nausea, vomiting, or diarrhea. 09-19-2022 upon evaluation patient's wound is actually showing signs of improvement though it still having quite a bit of drainage. I do believe he would benefit from switching over to [facility]. I discussed that with him today. He is definitely in agreement with that plan. 09-28-2022 upon evaluation patient's wound is actually showing signs of improvement. With that being said he has been using the [facility] but he has not had enough to really cover the whole wound. In fact he tells me he just got his supplies today that  we had ordered last week. Fortunately I do not see any evidence of active infection at this time. 10-05-2022 upon evaluation today patient appears to be doing well currently in regard to his foot wound there is actually much less debridement today to be done compared to previous. Fortunately I see no signs of active infection locally or systemically at this time. 11-02-2022 upon evaluation today patient appears to be doing well currently in regard to his wounds on the foot. Fortunately he seems to be making good progress is not quite as much of an improvement this week as it was last time I saw him but he had a lot going on and tells me that he has been up moving on it more. I do believe he needs to try to get this under control and I do believe staying off of it more will definitely help in that regard. 11-09-2022 upon evaluation today patient appears to be doing well currently in regard to his wound. He is showing  signs of improvement which is great news. Fortunately I do not see any evidence of active infection locally nor systemically at this time. No fever or chills noted 11-16-2022 patient's wound still slowly seems to be making progress. The good news is we have gotten the approval for the Apligraf which is awesome. Working to see about getting that started form hopefully will be able to get to him for the end of the year and then subsequently we can see if we can regain the approval for the final 3 in the first of the new year. Nonetheless I think getting this started as quickly as possible would be good for the patient currently. I do believe this is going to have a good result as far as helping with the healing and new tissue growth and epithelization in regard to the wound. He still using the offloading with a knee scooter at this point. 11-23-2022 upon evaluation today patient appears to be doing well currently in regard to his wound he does have a lot of drainage but fortunately no signs of infection at this point. Fortunately there does not appear to be any signs of active infection locally nor systemically at this time which is great news and overall I am extremely pleased with regard to where we stand. No fevers, chills, nausea, vomiting, or diarrhea. 12/28; patient arrived for Apligraf #2 today. The area is large and on the left plantar foot some irregular areas of hypergranulation however most of this looks clean. I think we are changing weekly because of drainage. 12-22-2022 upon evaluation today patient appears to be doing well currently in regard to his wound. He is not showing any signs of active infection which is [name], [name] ([id number]) [id number] Page 4 of 10 great news. No fevers, chills, nausea, vomiting, or diarrhea. Electronic Signature(s) Signed: 12/22/2022 11:26:47 AM By: Lenda Kelp PA-C Entered By: Lenda Kelp on 12/22/2022  11:26:47 -------------------------------------------------------------------------------- Physical Exam Details Patient Name: Date of Service: YUVIN, BUSSIERE 12/22/2022 10:45 A M Medical Record Number: [id number] Patient Account Number: [id number] Date of Birth/Sex: Treating RN: 1966/07/22 (57 y.o. [name] Primary Care Provider: [name] Other Clinician: Referring Provider: Treating Provider/Extender: Bonnye Fava Weeks in Treatment: 67 Constitutional Well-nourished and well-hydrated in no acute distress. Respiratory normal breathing without difficulty. Psychiatric this patient is able to make decisions and demonstrates good insight into disease process. Alert and Oriented x 3. pleasant and cooperative. Notes Upon inspection patient's wound bed actually  showed signs of some need for sharp debridement clearway some of the callus that shows he is walking on this some he tells me he is doing it mainly around home. I explained that is much as he can if he is able to stay off of this it would be best and he voiced understanding. Fortunately I do not see any evidence of active infection locally nor systemically at this time which is great news. Electronic Signature(s) Signed: 12/22/2022 11:27:31 AM By: Lenda Kelp PA-C Entered By: Lenda Kelp on 12/22/2022 11:27:31 -------------------------------------------------------------------------------- Physician Orders Details Patient Name: Date of Service: [name], [name] 12/22/2022 10:45 A M Medical Record Number: [id number] Patient Account Number: [id number] Date of Birth/Sex: Treating RN: 03/12/1966 (57 y.o. [name] Primary Care Provider: [name] Other Clinician: Referring Provider: Treating Provider/Extender: Bonnye Fava Weeks in Treatment: 1 Verbal / Phone Orders: No Diagnosis Coding ICD-10 BRAVERY, KETCHAM ([id number]) [id number] Page 5 of  10 Code Description [phone] Other chronic osteomyelitis, left ankle and foot E11.621 Type 2 diabetes mellitus with foot ulcer L97.524 Non-pressure chronic ulcer of other part of left foot with necrosis of bone Follow-up Appointments Return Appointment in 1 week. Anesthetic (Use 'Patient Medications' Section for Anesthetic Order Entry) Lidocaine applied to wound bed Cellular or Tissue Based Products Cellular or Tissue Based Product Type: - aligraft Cellular or Tissue Based Product applied to wound bed; including contact layer, fixation with steri-strips, dry gauze and cover dressing. (DO NOT REMOVE). Edema Control - Lymphedema / Segmental Compressive Device / Other Elevate, Exercise Daily and A void Standing for Long Periods of Time. Elevate legs to the level of the heart and pump ankles as often as possible Elevate leg(s) parallel to the floor when sitting. Wound Treatment Wound #1 - Foot Wound Laterality: Plantar, Left Prim Dressing: Gauze (DME) (Generic) 1 x Per Day/30 Days ary Discharge Instructions: As directed: dry, moistened with saline or moistened with Dakins Solution Prim Dressing: Hydrofera Blue Ready Transfer Foam, 2.5x2.5 (in/in) 1 x Per Day/30 Days ary Discharge Instructions: Apply Hydrofera Blue Ready to wound bed as directed Secondary Dressing: ABD Pad 5x9 (in/in) (Generic) 1 x Per Day/30 Days Discharge Instructions: Cover with ABD pad Secondary Dressing: Kerlix 4.5 x 4.1 (in/yd) (DME) (Generic) 1 x Per Day/30 Days Discharge Instructions: Apply Kerlix 4.5 x 4.1 (in/yd) as instructed Secured With: Medipore T - 14M Medipore H Soft Cloth Surgical T ape ape, 2x2 (in/yd) (Generic) 1 x Per Day/30 Days Electronic Signature(s) Signed: 12/22/2022 12:55:59 PM By: Yevonne Pax RN Signed: 12/22/2022 2:37:27 PM By: Lenda Kelp PA-C Entered By: Yevonne Pax on 12/22/2022 11:21:23 -------------------------------------------------------------------------------- Problem List  Details Patient Name: Date of Service: [name], [name] 12/22/2022 10:45 A M Medical Record Number: [id number] Patient Account Number: [id number] Date of Birth/Sex: Treating RN: 08/16/1966 (57 y.o. [name] Primary Care Provider: [name] Other Clinician: Referring Provider: Treating Provider/Extender: Bonnye Fava Weeks in Treatment: 27 Active Problems ICD-10 Encounter Code Description Active Date MDM Diagnosis [phone] Other chronic osteomyelitis, left ankle and foot 06/15/2022 No Yes [name], [name] ([id number]) [id number] Page 6 of 10 E11.621 Type 2 diabetes mellitus with foot ulcer 06/15/2022 No Yes L97.524 Non-pressure chronic ulcer of other part of left foot with necrosis of bone 06/15/2022 No Yes Inactive Problems Resolved Problems Electronic Signature(s) Signed: 12/22/2022 10:55:58 AM By: Lenda Kelp PA-C Entered By: Lenda Kelp on 12/22/2022 10:55:58 -------------------------------------------------------------------------------- Progress Note Details Patient Name: Date of Service: [name], [name] 12/22/2022 10:45  A M Medical Record Number: [id number] Patient Account Number: [id number] Date of Birth/Sex: Treating RN: 1966/04/24 (57 y.o. 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 Glenford Peers care:   ADMISSION6/15/2023This is a 70 year old poorly controlled type II diabetic (last A1c 11.5%). In January of this year, he presented to the hospital with an abscess in his left foot. He also had a foreign body present. It was removed in the ER and he was admitted to the hospital for IV  antibiotics. He was subsequently discharged on oral antibiotics. Due to financial concerns, he has not taken his diabetic medications (metformin and 70/30 insulin) and as a result, presented back to the emergency room on June 1 with worsening findings, including frank pus and osteomyelitis on MRI. BKA was recommended, but he declined. He was taken to the operating room by Dr. Sharol Given who performed an extensive debridement. Cultures were taken and he was placed on IV antibiotics. He saw infectious disease while in the hospital who prescribed a 6-week course of IV antibiotics including cefazolin as well as oral metronidazole and levofloxacin. The infectious disease provider also indicated that he would benefit from below-knee amputation, but the patient desired another opinion and therefore he has been referred to wound care center for further evaluation and management. ABIs obtained while he was in the hospital demonstrate adequate blood flow. Essentially the entire bottom of the patient's left foot has been removed. The heads of the majority of his metatarsal bones are exposed along with their accompanying tendons. Muscle and fat are exposed as well. The wound surface is fairly dry; the patient reports that he was not given any specific wound care instructions other than to place a dry dressing on it after showering. There is no odor or purulent drainage identified. 05/26/2022: The wound measures a little bit smaller today. There is some good granulation tissue beginning to form on the surface. He still has exposed bone and tendon. There is some slough accumulation. He saw infectious disease earlier this week and was frustrated that they recommended amputation. He is interested in another surgical opinion. He continues to smoke but says that he has cut down. He is using a knee scooter for mobility so that he can stay off of his foot. 06/05/2022: He saw Dr. Sharol Given last week who was supportive of our efforts to  try to salvage the foot. I referred him to vascular surgery to see Dr. Carlis Abbott but he has not received an appointment yet. He continues on IV antibiotics. He says that after our conversation last week about him being unable to initiate hyperbarics if he was going to continue to smoke, he has not had a cigarette since. He is now at 4 weeks of conventional management and his insurance was initiated on July 1. Today, the wound continues to show evidence of improvement. There is good granulation tissue forming. The metatarsals are still exposed but actually have some pink periosteum. There is still some slough and fibrinous exudate present. No odor or purulent drainage. We have just been using Dakin's dressing changes for now. Admission to [place] [facility]: 06-15-2022 upon evaluation today patient presents for initial inspection here in our clinic though he has been seen by Dr. Celine Ahr in [place] up  to this point. I did review her notes and records as well they have been using Dakin's moistened gauze dressing which has done a great job at helping to clean out the [place], Truman Hayward ([id number]) [id number] Page 7 of 10 bottom of his foot. Please see the discourse above for additional information in regard to treatment course up until the patient arrives here in the [place] clinic today. The patient is ready to get started with HBO therapy as soon as possible I do think based on all my review of his records this seems to be appropriate he does have chronic osteomyelitis is also been offered amputation this is obviously a limb salvage operation here. We are trying to prevent him from having to undergo an extensive amputation which would likely be a below-knee amputation which in turn is going to affect his quality of life he wants to try to avoid that if at all possible I think hyperbaric oxygen therapy is his best bet to achieve that goal. Currently he has been on IV Ancef.  That actually ends tomorrow. Following that according to notes it appears he supposed to be taking Levaquin and Flagyl for an additional period of time although I am not certain exactly how long that with the. He has been seeing regional physicians infectious disease in [place] and again that so I recommended that he contact today in order to find out what the plan is going forward. His most recent hemoglobin A1c as noted above was 11.5 on 05-07-2022 he tells me trying to work on that he is also quit smoking as of this point. 06-22-2022 upon evaluation today patient appears to be doing well currently in regard to his wound all things considered. He is showing signs of a lot of new granulation tissue and overall very pleased. He is changing this once a day with the Dakin's moistened gauze dressing. Fortunately I do not see any evidence of infection locally or systemically at this time. We have been trying to get him into the hyperbaric chamber he was having a lot of issues with sinus pressure and we have made referral and called multiple ENT specialist the earliest we have been able to get his August 9 at [facility] ear nose throat. They are going to try to work him in sooner if they have any opportunities to do so. Obviously I think the sooner he can do this the better. We need to get him in hyperbarics as quickly as possible. 7/26; patient presents for follow-up. He has been using Dakin's wet-to-dry dressings and oil emollient dressing over the exposed bone. He currently denies systemic signs of infection. He states he is scheduled to see infectious disease, Dr Candiss Norse next week. She had ordered an MRI of the left foot and he is scheduled to have this done tomorrow. He currently denies systemic signs of infection Including fever/chills, nausea/vomiting increased warmth or erythema to the wound bed or purulent drainage. He started hyperbaric oxygen treatment however did not tolerate this due to sinus  pressure. He is scheduled to see ENT in 2 weeks for evaluation. He is not continuing HBO until he is cleared by ENT. 07-06-2022 patient presents today for follow-up concerning his plantar foot ulcer. The good news is this actually is significantly better compared to previous. Some of the bone which is necrotic is starting to slough off and I am going to perform debridement to clear this away today. Nonetheless I do think that the patient is going to need hyperbarics and  we need to try to get it as soon as possible. He still having a lot of sinus pressure I can even hear the congestion today. He is on 3 different antibiotics cefadroxil, metronidazole, and Levaquin currently for the osteomyelitis. For that reason this should actually help with the infection if he does have a chronic sinusitis I am also going to see about getting him on steroids to try to see if this can benefit him as well. He voiced understanding and he is in agreement with giving that shot. This is probably can to spike his blood sugars which I discussed with him he is going to be seeing his primary care provider later today I want him to let her know as well what is going on and why so that she could be aware but I really do think he needs to take the prednisone. We need to get him in the chamber as soon as possible to try to help with getting this wound to heal and closed so that he does not lose his foot. 07-13-2022 upon evaluation today patient appears to be doing better in regard to his wound he continues to show signs of new granulation am going to perform some debridement today but overall this is doing quite well. 07-20-2022 upon evaluation today patient's wound is actually showing some signs of improvement he does have 1 area on the fifth metatarsal region where there is some necrotic tendon that is noted at this point. Unfortunately this is preventing good granulation and over this point the rest of the metatarsal region is  completely covered over with granulation tissue which is great news there is some other slough and biofilm buildup that I will clearway as well. 07-27-2022 upon evaluation today patient appears to be doing well currently in regard to his plantar foot ulcer. He has been tolerating the dressing changes without complication fortunately. I do believe he is making excellent progress and each time I see him this is better and better is pretty much completely covered as far as any bone exposure is concerned and he still is on the IV antibiotics were still also undergoing hyperbarics at this point that he just got started and is doing quite well with since getting the tubes in his ears.Marland Kitchen 08-03-2022 upon evaluation today patient appears to be doing well in general although has been having a lot of drainage compared to normal. It was actually leaking through his dressing today. He tells me he changed it last night. With that being said he has been up on his feet a lot more which I think is probably a big part of the issue here. Fortunately I do not see any signs of infection locally or systemically. 08-17-2022 upon evaluation today patient is doing well with regard to his wound this is actually measuring smaller which is good news. I am very pleased in that regard. With that being said he has not been participating HBO therapy as he really needs to afford to see this heal appropriately and completely. In fact he has only made 9 total HBO treatments in the past 4 weeks. Again this is not therapeutic and I discussed that with the patient. Nonetheless he is still improving and since the hyperbarics is not helping based on the discussion today the patient is going to discontinue HBO therapy at this point. He tells me that getting here 5 days a week just is not possible. He has had difficulty getting here and then also when he was  here had difficulty with getting here at the right time which has also been part of the  problem. Nonetheless either way I am pleased that he is doing better and my hope is that we will still be able to get him healed obviously he should continue with the antibiotics as well as the wound care as he has been doing this seems to be doing excellent for him but everything is improving quite nicely. 08-24-2022 upon evaluation today patient appears to be doing well currently in regard to his wound this is actually measuring significantly better even compared to last week. I am very pleased in that regard I do not see any signs of infection and I think he is making excellent progress here. 08-31-2022 upon evaluation today patient's wound is actually showing signs of significant improvement. I am actually very pleased with where things stand currently. There does not appear to be any evidence of active infection locally or systemically at this time. No fevers, chills, nausea, vomiting, or diarrhea. 09-19-2022 upon evaluation patient's wound is actually showing signs of improvement though it still having quite a bit of drainage. I do believe he would benefit from switching over to [facility]. I discussed that with him today. He is definitely in agreement with that plan. 09-28-2022 upon evaluation patient's wound is actually showing signs of improvement. With that being said he has been using the [facility] but he has not had enough to really cover the whole wound. In fact he tells me he just got his supplies today that we had ordered last week. Fortunately I do not see any evidence of active infection at this time. 10-05-2022 upon evaluation today patient appears to be doing well currently in regard to his foot wound there is actually much less debridement today to be done compared to previous. Fortunately I see no signs of active infection locally or systemically at this time. 11-02-2022 upon evaluation today patient appears to be doing well currently in regard to his wounds on the foot.  Fortunately he seems to be making good progress is not quite as much of an improvement this week as it was last time I saw him but he had a lot going on and tells me that he has been up moving on it more. I do believe he needs to try to get this under control and I do believe staying off of it more will definitely help in that regard. 11-09-2022 upon evaluation today patient appears to be doing well currently in regard to his wound. He is showing signs of improvement which is great news. Fortunately I do not see any evidence of active infection locally nor systemically at this time. No fever or chills noted 11-16-2022 patient's wound still slowly seems to be making progress. The good news is we have gotten the approval for the Apligraf which is awesome. Working to see about getting that started form hopefully will be able to get to him for the end of the year and then subsequently we can see if we can regain the approval for the final 3 in the first of the new year. Nonetheless I think getting this started as quickly as possible would be good for the patient currently. I do believe this is going to have a good result as far as helping with the healing and new tissue growth and epithelization in regard to the wound. He still using the offloading with a knee scooter at this point. 11-23-2022 upon evaluation today patient  appears to be doing well currently in regard to his wound he does have a lot of drainage but fortunately no signs of infection at this point. Fortunately there does not appear to be any signs of active infection locally nor systemically at this time which is great news and overall I am extremely pleased with regard to where we stand. No fevers, chills, nausea, vomiting, or diarrhea. 12/28; patient arrived for Apligraf #2 today. The area is large and on the left plantar foot some irregular areas of hypergranulation however most of this looks [name], [name] ([id number])  [id number] Page 8 of 10 clean. I think we are changing weekly because of drainage. 12-22-2022 upon evaluation today patient appears to be doing well currently in regard to his wound. He is not showing any signs of active infection which is great news. No fevers, chills, nausea, vomiting, or diarrhea. Objective Constitutional Well-nourished and well-hydrated in no acute distress. Vitals Time Taken: 10:59 AM, Height: 70 in, Weight: 230 lbs, BMI: 33, Temperature: 98.2 F, Pulse: 99 bpm, Respiratory Rate: 18 breaths/min, Blood Pressure: 164/94 mmHg. Respiratory normal breathing without difficulty. Psychiatric this patient is able to make decisions and demonstrates good insight into disease process. Alert and Oriented x 3. pleasant and cooperative. General Notes: Upon inspection patient's wound bed actually showed signs of some need for sharp debridement clearway some of the callus that shows he is walking on this some he tells me he is doing it mainly around home. I explained that is much as he can if he is able to stay off of this it would be best and he voiced understanding. Fortunately I do not see any evidence of active infection locally nor systemically at this time which is great news. Integumentary (Hair, Skin) Wound #1 status is Open. Original cause of wound was Gradually Appeared. The date acquired was: 12/04/2021. The wound has been in treatment 27 weeks. The wound is located on the Left,Plantar Foot. The wound measures 6cm length x 6.5cm width x 0.2cm depth; 30.631cm^2 area and 6.126cm^3 volume. There is Fat Layer (Subcutaneous Tissue) exposed. There is no tunneling or undermining noted. There is a medium amount of serosanguineous drainage noted. There is medium (34-66%) pink, hyper - granulation within the wound bed. There is a medium (34-66%) amount of necrotic tissue within the wound bed including Adherent Slough. 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 There was a Excisional Skin/Subcutaneous Tissue Debridement with a total area of 39 sq cm performed by Nelida Meuse., PA-C. With the following instrument(s): Curette to remove Viable and Non-Viable tissue/material. Material removed includes Callus, Subcutaneous Tissue, Skin: Dermis, and Skin: Epidermis. No specimens were taken. A time out was conducted at 11:15, prior to the start of the procedure. A Moderate amount of bleeding was controlled with Pressure. The procedure was tolerated well with a pain level of 0 throughout and a pain level of 0 following the procedure. Post Debridement Measurements: 6cm length x 6.5cm width x 0.2cm depth; 6.126cm^3 volume. Character of Wound/Ulcer Post Debridement is improved. Severity of Tissue Post Debridement is: Fat layer exposed. Post procedure Diagnosis Wound #1: Same as Pre-Procedure Plan Follow-up Appointments: Return Appointment in 1 week. Anesthetic (  Use 'Patient Medications' Section for Anesthetic Order Entry): Lidocaine applied to wound bed Cellular or Tissue Based Products: Cellular or Tissue Based Product Type: - aligraft Cellular or Tissue Based Product applied to wound bed; including contact layer, fixation with steri-strips, dry gauze and cover dressing. (DO NOT REMOVE). Edema Control - Lymphedema / Segmental Compressive Device / Other: Elevate, Exercise Daily and Avoid Standing for Long Periods of Time. Elevate legs to the level of the heart and pump ankles as often as possible Elevate leg(s) parallel to the floor when sitting. [name], [name] ([id number])  [id number] Page 9 of 10 WOUND #1: - Foot Wound Laterality: Plantar, Left Prim Dressing: Gauze (DME) (Generic) 1 x Per Day/30 Days ary Discharge Instructions: As directed: dry, moistened with saline or moistened with Dakins Solution Prim Dressing: Hydrofera Blue Ready Transfer Foam, 2.5x2.5 (in/in) 1 x Per Day/30 Days ary Discharge Instructions: Apply Hydrofera Blue Ready to wound bed as directed Secondary Dressing: ABD Pad 5x9 (in/in) (Generic) 1 x Per Day/30 Days Discharge Instructions: Cover with ABD pad Secondary Dressing: Kerlix 4.5 x 4.1 (in/yd) (DME) (Generic) 1 x Per Day/30 Days Discharge Instructions: Apply Kerlix 4.5 x 4.1 (in/yd) as instructed Secured With: Medipore T - 97M Medipore H Soft Cloth Surgical T ape ape, 2x2 (in/yd) (Generic) 1 x Per Day/30 Days 1. I am good recommend currently based on what we are seeing that we go ahead and after debriding and switch over to the [facility] for now and then go ahead and see about getting Apligraf ordered for him for next week. We got a restart this he is at #3 but this week we did not have it ready for him which is okay. Will get that restarted next week. 2. I am also can recommend as well that we go ahead and have the patient continue to utilize at home dressing changes 3 times per week with the [facility] which I think is doing quite well. 3. I am also can recommend that we utilize ABD pad and Kerlix to secure in place. Number form also can recommend the patient should try to stay off of this as much as he can using the knee scooter the more that he does the faster this will heal and I discussed that with him as well. We will see patient back for reevaluation in 1 week here in the clinic. If anything worsens or changes patient will contact our office for additional recommendations. Electronic Signature(s) Signed: 12/22/2022 11:28:28 AM By: Lenda Kelp PA-C Entered By: Lenda Kelp on  12/22/2022 11:28:28 -------------------------------------------------------------------------------- SuperBill Details Patient Name: Date of Service: Matthew [name], [name] 12/22/2022 Medical Record Number: [id number] Patient Account Number: [id number] Date of Birth/Sex: Treating RN: 1966-02-16 (57 y.o. [name]) Yevonne Pax Primary Care Provider: [name] Other Clinician: Referring Provider: Treating Provider/Extender: Bonnye Fava Weeks in Treatment: 27 Diagnosis Coding ICD-10 Codes Code Description [phone] Other chronic osteomyelitis, left ankle and foot E11.621 Type 2 diabetes mellitus with foot ulcer L97.524 Non-pressure chronic ulcer of other part of left foot with necrosis of bone Facility Procedures : CPT4 Code: [id number] Description: 11042 - DEB SUBQ TISSUE 20 SQ CM/< ICD-10 Diagnosis Description L97.524 Non-pressure chronic ulcer of other part of left foot with necrosis of bone Modifier: Quantity: 1 : CPT4 Code: [id number] Description: 11045 - DEB SUBQ TISS EA ADDL 20CM ICD-10 Diagnosis Description L97.524 Non-pressure chronic ulcer of other part of left foot with necrosis of bone Modifier: Quantity: 1 Physician Procedures : CPT4 Code Description Modifier  [id number] 11042 - WC PHYS SUBQ TISS 20 SQ CM Dominski, Dquan ([id number]) [id number] Page ICD-10 Diagnosis Description [phone] Non-pressure chronic ulcer of other part of left foot with necrosis of  bone Quantity: 1 10 of 10 : [id number] 11045 - WC PHYS SUBQ TISS EA ADDL 20 CM 1 ICD-10 Diagnosis Description L97.524 Non-pressure chronic ulcer of other part of left foot with necrosis of bone Quantity: Electronic Signature(s) Signed: 12/22/2022 11:28:51 AM By: Lenda KelpStone III, Joselin Crandell PA-C Entered By: Lenda KelpStone III, Monteen Toops on 12/22/2022 11:28:51

## 2022-12-22 NOTE — Progress Notes (Addendum)
SOLACE, WENDORFF ([id number]) [id number] Page 1 of 8 Visit Report for 12/22/2022 Arrival Information Details Patient Name: Date of Service: [name], [name] 12/22/2022 10:45 A M Medical Record Number: [id number] Patient Account Number: [id number] Date of Birth/Sex: Treating RN: 1966-01-05 (57 y.o. [name]) Carlene Coria Primary Care Khylon Davies: Charlott Rakes Other Clinician: Referring Johnice Riebe: Treating Zakiah Gauthreaux/Extender: Sherene Sires Weeks in Treatment: 67 Visit Information History Since Last Visit Added or deleted any medications: No Patient Arrived: Ambulatory Any new allergies or adverse reactions: No Arrival Time: 10:56 Had a fall or experienced change in No Accompanied By: self activities of daily living that may affect Transfer Assistance: None risk of falls: Patient Identification Verified: Yes Signs or symptoms of abuse/neglect since last visito No Secondary Verification Process Completed: Yes Hospitalized since last visit: No Patient Requires Transmission-Based Precautions: No Implantable device outside of the clinic excluding No Patient Has Alerts: No cellular tissue based products placed in the center since last visit: Has Dressing in Place as Prescribed: Yes Pain Present Now: No Electronic Signature(s) Signed: 12/22/2022 12:55:59 PM By: Carlene Coria RN Entered By: Carlene Coria on 12/22/2022 10:59:29 -------------------------------------------------------------------------------- Clinic Level of Care Assessment Details Patient Name: Date of Service: KIMSEY, DEMAREE 12/22/2022 10:45 A M Medical Record Number: [id number] Patient Account Number: [id number] Date of Birth/Sex: Treating RN: 03/14/1966 (57 y.o. [name]) Carlene Coria Primary Care Renn Stille: Charlott Rakes Other Clinician: Referring Wylie Russon: Treating Mili Piltz/Extender: Elonda Husky, Enobong Weeks in Treatment: 27 Clinic Level of Care Assessment Items TOOL 1 Quantity Score []  -  0 Use when EandM and Procedure is performed on INITIAL visit ASSESSMENTS - Nursing Assessment / Reassessment []  - 0 General Physical Exam (combine w/ comprehensive assessment (listed just below) when performed on new pt. evals) []  - 0 Comprehensive Assessment (HX, ROS, Risk Assessments, Wounds Hx, etc.) Salas, Georgi ([id number]) [id number] Page 2 of 8 ASSESSMENTS - Wound and Skin Assessment / Reassessment []  - 0 Dermatologic / Skin Assessment (not related to wound area) ASSESSMENTS - Ostomy and/or Continence Assessment and Care []  - 0 Incontinence Assessment and Management []  - 0 Ostomy Care Assessment and Management (repouching, etc.) PROCESS - Coordination of Care []  - 0 Simple Patient / Family Education for ongoing care []  - 0 Complex (extensive) Patient / Family Education for ongoing care []  - 0 Staff obtains Programmer, systems, Records, T Results / Process Orders est []  - 0 Staff telephones HHA, Nursing Homes / Clarify orders / etc []  - 0 Routine Transfer to another Facility (non-emergent condition) []  - 0 Routine Hospital Admission (non-emergent condition) []  - 0 New Admissions / Biomedical engineer / Ordering NPWT Apligraf, etc. , []  - 0 Emergency Hospital Admission (emergent condition) PROCESS - Special Needs []  - 0 Pediatric / Minor Patient Management []  - 0 Isolation Patient Management []  - 0 Hearing / Language / Visual special needs []  - 0 Assessment of Community assistance (transportation, D/C planning, etc.) []  - 0 Additional assistance / Altered mentation []  - 0 Support Surface(s) Assessment (bed, cushion, seat, etc.) INTERVENTIONS - Miscellaneous []  - 0 External ear exam []  - 0 Patient Transfer (multiple staff / Civil Service fast streamer / Similar devices) []  - 0 Simple Staple / Suture removal (25 or less) []  - 0 Complex Staple / Suture removal (26 or more) []  - 0 Hypo/Hyperglycemic Management (do not check if billed separately) []  -  0 Ankle / Brachial Index (ABI) - do not check if billed separately Has the patient been seen at the hospital within the last three years: Yes  Total Score: 0 Level Of Care: ____ Electronic Signature(s) Signed: 12/22/2022 12:55:59 PM By: Yevonne Pax RN Entered By: Yevonne Pax on 12/22/2022 11:21:32 -------------------------------------------------------------------------------- Encounter Discharge Information Details Patient Name: Date of Service: MA BECKHAM, CAPISTRAN 12/22/2022 10:45 A M Medical Record Number: [id number] Patient Account Number: [id number] Date of Birth/Sex: Treating RN: [birth year] (57 y.o. Melonie [place] Primary Care Jedi Catalfamo: [name] Other Clinician: Referring Reilyn Nelson: Treating Cambridge Deleo/Extender: Bonnye Fava Weeks in Treatment: [address], [place] ([id number]) [id number] Page 3 of 8 Encounter Discharge Information Items Post Procedure Vitals Discharge Condition: Stable Temperature (F): 98.2 Ambulatory Status: Ambulatory Pulse (bpm): 99 Discharge Destination: Home Respiratory Rate (breaths/min): 18 Transportation: Private Auto Blood Pressure (mmHg): 164/94 Accompanied By: self Schedule Follow-up Appointment: Yes Clinical Summary of Care: Electronic Signature(s) Signed: 12/22/2022 12:55:59 PM By: Yevonne Pax RN Entered By: Yevonne Pax on 12/22/2022 11:22:13 -------------------------------------------------------------------------------- Lower Extremity Assessment Details Patient Name: Date of Service: JOSEJUAN, HOAGLIN 12/22/2022 10:45 A M Medical Record Number: [id number] Patient Account Number: [id number] Date of Birth/Sex: Treating RN: Jan 15, 1966 (57 y.o. Melonie [place] Primary Care Kiyana Vazguez: [name] Other Clinician: Referring Henning Ehle: Treating Estefania Kamiya/Extender: Laray Anger, Enobong Weeks in Treatment: 27 Electronic Signature(s) Signed: 12/22/2022 12:55:59 PM By: Yevonne Pax RN Entered By: Yevonne Pax  on 12/22/2022 11:05:00 -------------------------------------------------------------------------------- Multi Wound Chart Details Patient Name: Date of Service: MA EMAAD, NANNA 12/22/2022 10:45 A M Medical Record Number: [id number] Patient Account Number: [id number] Date of Birth/Sex: Treating RN: 04-Sep-1966 (57 y.o. Melonie [place] Primary Care Mahogani Holohan: [name] Other Clinician: Referring Carneshia Raker: Treating Patina Spanier/Extender: Laray Anger, Enobong Weeks in Treatment: 27 Vital Signs Height(in): 70 Pulse(bpm): 99 Weight(lbs): 230 Blood Pressure(mmHg): 164/94 Body Mass Index(BMI): 33 Temperature(F): 98.2 Respiratory Rate(breaths/min): 18 [1:Photos:] Lorenzetti, Clayden ([id number]) [1:Photos:] [N/A:N/A] Left, Plantar Foot N/A N/A Wound Location: Gradually Appeared N/A N/A Wounding Event: Diabetic Wound/Ulcer of the Lower N/A N/A Primary Etiology: Extremity Type II Diabetes N/A N/A Comorbid History: 12/04/2021 N/A N/A Date Acquired: 63 N/A N/A Weeks of Treatment: Open N/A N/A Wound Status: No N/A N/A Wound Recurrence: Yes N/A N/A Pending A mputation on Presentation: 6x6.5x0.2 N/A N/A Measurements L x W x D (cm) 30.631 N/A N/A A (cm) : rea 6.126 N/A N/A Volume (cm) : 69.50% N/A N/A % Reduction in A rea: 87.80% N/A N/A % Reduction in Volume: Grade 4 N/A N/A Classification: Medium N/A N/A Exudate A mount: Serosanguineous N/A N/A Exudate Type: red, brown N/A N/A Exudate Color: Medium (34-66%) N/A N/A Granulation A mount: Pink, Hyper-granulation N/A N/A Granulation Quality: Medium (34-66%) N/A N/A Necrotic A mount: Fat Layer (Subcutaneous Tissue): Yes N/A N/A Exposed Structures: Fascia: No Tendon: No Muscle: No Joint: No Bone: No None N/A N/A Epithelialization: Treatment Notes Electronic Signature(s) Signed: 12/22/2022 12:55:59 PM By: Yevonne Pax RN Entered By: Yevonne Pax on 12/22/2022  11:05:04 -------------------------------------------------------------------------------- Multi-Disciplinary Care Plan Details Patient Name: Date of Service: MA RAJAH, TAGLIAFERRO 12/22/2022 10:45 A M Medical Record Number: [id number] Patient Account Number: [id number] Date of Birth/Sex: Treating RN: May 19, 1966 (57 y.o. Melonie [place] Primary Care Arline Ketter: [name] Other Clinician: Referring Briyonna Omara: Treating Jourdyn Ferrin/Extender: Bonnye Fava Weeks in Treatment: 27 Active Inactive Osteomyelitis Nursing Diagnoses: Infection: osteomyelitis Knowledge deficit related to disease process and management Phagan, Faris ([id number]) [id number] Page 5 of 8 Potential for infection: osteomyelitis Goals: Diagnostic evaluation for osteomyelitis completed as ordered Date Initiated: 06/28/2022 Date Inactivated: 08/03/2022 Target Resolution Date: 06/28/2022 Goal Status: Met Patient/caregiver will verbalize understanding of disease process and disease management Date Initiated: 06/28/2022  Date Inactivated: 08/31/2022 Target Resolution Date: 06/28/2022 Goal Status: Met Patient's osteomyelitis will resolve Date Initiated: 06/28/2022 Target Resolution Date: 12/17/2022 Goal Status: Active Signs and symptoms for osteomyelitis will be recognized and promptly addressed Date Initiated: 06/28/2022 Date Inactivated: 08/03/2022 Target Resolution Date: 06/28/2022 Goal Status: Met Interventions: Assess for signs and symptoms of osteomyelitis resolution every visit Provide education on osteomyelitis Screen for HBO Notes: Electronic Signature(s) Signed: 12/22/2022 12:55:59 PM By: Yevonne Pax RN Entered By: Yevonne Pax on 12/22/2022 11:05:28 -------------------------------------------------------------------------------- Pain Assessment Details Patient Name: Date of Service: REGINAL, WOJCICKI 12/22/2022 10:45 A M Medical Record Number: [id number] Patient Account Number:  [id number] Date of Birth/Sex: Treating RN: 1966/08/14 (57 y.o. Melonie [place] Primary Care Yvonna Brun: [name] Other Clinician: Referring Selda Jalbert: Treating Elka Satterfield/Extender: Bonnye Fava Weeks in Treatment: 27 Active Problems Location of Pain Severity and Description of Pain Patient Has Paino No Site Locations Pain Management and Medication Current Pain Management: RAMELLO, CORDIAL ([id number]) [id number] Page 6 of 8 Electronic Signature(s) Signed: 12/22/2022 12:55:59 PM By: Yevonne Pax RN Entered By: Yevonne Pax on 12/22/2022 10:59:54 -------------------------------------------------------------------------------- Patient/Caregiver Education Details Patient Name: Date of Service: [place] Lanny Hurst 1/19/2024andnbsp10:45 A M Medical Record Number: [id number] Patient Account Number: [id number] Date of Birth/Gender: Treating RN: Nov 28, 1966 (57 y.o. Melonie [place] Primary Care Physician: [name] Other Clinician: Referring Physician: Treating Physician/Extender: Bonnye Fava Weeks in Treatment: 67 Education Assessment Education Provided To: Patient Education Topics Provided Wound/Skin Impairment: Methods: Explain/Verbal Responses: State content correctly Electronic Signature(s) Signed: 12/22/2022 12:55:59 PM By: Yevonne Pax RN Entered By: Yevonne Pax on 12/22/2022 11:05:17 -------------------------------------------------------------------------------- Wound Assessment Details Patient Name: Date of Service: PADDY, NEIS 12/22/2022 10:45 A M Medical Record Number: [id number] Patient Account Number: [id number] Date of Birth/Sex: Treating RN: 08/31/1966 (57 y.o. Melonie [place] Primary Care Cederick Broadnax: [name] Other Clinician: Referring Alayshia Marini: Treating Amadou Katzenstein/Extender: Laray Anger, Enobong Weeks in Treatment: 27 Wound Status Wound Number: 1 Primary Etiology: Diabetic Wound/Ulcer of the Lower  Extremity Wound Location: Left, Plantar Foot Wound Status: Open Wounding Event: Gradually Appeared Comorbid History: Type II Diabetes Date Acquired: 12/04/2021 Weeks Of Treatment: [address] [place] ([id number]) [id number] Page 7 of 8 Clustered Wound: No Pending Amputation On Presentation Photos Wound Measurements Length: (cm) 6 Width: (cm) 6.5 Depth: (cm) 0.2 Area: (cm) 30.631 Volume: (cm) 6.126 % Reduction in Area: 69.5% % Reduction in Volume: 87.8% Epithelialization: None Tunneling: No Undermining: No Wound Description Classification: Grade 4 Exudate Amount: Medium Exudate Type: Serosanguineous Exudate Color: red, brown Foul Odor After Cleansing: No Slough/Fibrino Yes Wound Bed Granulation Amount: Medium (34-66%) Exposed Structure Granulation Quality: Pink, Hyper-granulation Fascia Exposed: No Necrotic Amount: Medium (34-66%) Fat Layer (Subcutaneous Tissue) Exposed: Yes Necrotic Quality: Adherent Slough Tendon Exposed: No Muscle Exposed: No Joint Exposed: No Bone Exposed: No Treatment Notes Wound #1 (Foot) Wound Laterality: Plantar, Left Cleanser Peri-Wound Care Topical Primary Dressing Gauze Discharge Instruction: As directed: dry, moistened with saline or moistened with Dakins Solution Hydrofera Blue Ready Transfer Foam, 2.5x2.5 (in/in) Discharge Instruction: Apply Hydrofera Blue Ready to wound bed as directed Secondary Dressing ABD Pad 5x9 (in/in) Discharge Instruction: Cover with ABD pad Kerlix 4.5 x 4.1 (in/yd) Discharge Instruction: Apply Kerlix 4.5 x 4.1 (in/yd) as instructed Secured With Medipore T - 40M Medipore H Soft Cloth Surgical T ape ape, 2x2 (in/yd) Compression Wrap Compression Stockings Add-Ons Electronic Signature(s) Signed: 12/22/2022 12:55:59 PM By: Yevonne Pax RN Entered By: Yevonne Pax on 12/22/2022 11:02:35 [name] ([id number]) [id number] Page 8 of  8 --------------------------------------------------------------------------------  Vitals Details Patient Name: Date of Service: GL[place]ILLE, ESPINA 12/22/2022 10:45 A M Medical Record Number: [id number] Patient Account Number: [id number] Date of Birth/Sex: Treating RN: 12-13-1965 (57 y.o. [name]) Carlene Coria Primary Care Batsheva Stevick: Charlott Rakes Other Clinician: Referring Amad Mau: Treating Dymin Dingledine/Extender: Sherene Sires Weeks in Treatment: 27 Vital Signs Time Taken: 10:59 Temperature (F): 98.2 Height (in): 70 Pulse (bpm): 99 Weight (lbs): 230 Respiratory Rate (breaths/min): 18 Body Mass Index (BMI): 33 Blood Pressure (mmHg): 164/94 Reference Range: 80 - 120 mg / dl Electronic Signature(s) Signed: 12/22/2022 12:55:59 PM By: Carlene Coria RN Entered By: Carlene Coria on 12/22/2022 10:59:47

## 2022-12-25 ENCOUNTER — Encounter: Payer: Self-pay | Admitting: Gastroenterology

## 2022-12-25 DIAGNOSIS — E11621 Type 2 diabetes mellitus with foot ulcer: Secondary | ICD-10-CM | POA: Diagnosis not present

## 2022-12-26 ENCOUNTER — Encounter: Payer: Self-pay | Admitting: Gastroenterology

## 2022-12-26 ENCOUNTER — Ambulatory Visit (AMBULATORY_SURGERY_CENTER): Payer: 59 | Admitting: Gastroenterology

## 2022-12-26 VITALS — BP 129/80 | HR 92 | Temp 98.2°F | Resp 12 | Ht 70.0 in | Wt 254.0 lb

## 2022-12-26 DIAGNOSIS — Z1211 Encounter for screening for malignant neoplasm of colon: Secondary | ICD-10-CM | POA: Diagnosis not present

## 2022-12-26 DIAGNOSIS — I10 Essential (primary) hypertension: Secondary | ICD-10-CM | POA: Diagnosis not present

## 2022-12-26 DIAGNOSIS — E119 Type 2 diabetes mellitus without complications: Secondary | ICD-10-CM | POA: Diagnosis not present

## 2022-12-26 MED ORDER — SODIUM CHLORIDE 0.9 % IV SOLN: 500.0000 mL | INTRAVENOUS | Status: DC

## 2022-12-26 NOTE — Patient Instructions (Signed)
YOU HAD AN ENDOSCOPIC PROCEDURE TODAY AT [facility]:   Refer to the procedure report that was given to you for any specific questions about what was found during the examination.  If the procedure report does not answer your questions, please call your gastroenterologist to clarify.  If you requested that your care partner not be given the details of your procedure findings, then the procedure report has been included in a sealed envelope for you to review at your convenience later.  YOU SHOULD EXPECT: Some feelings of bloating in the abdomen. Passage of more gas than usual.  Walking can help get rid of the air that was put into your GI tract during the procedure and reduce the bloating. If you had a lower endoscopy (such as a colonoscopy or flexible sigmoidoscopy) you may notice spotting of blood in your stool or on the toilet paper. If you underwent a bowel prep for your procedure, you may not have a normal bowel movement for a few days.  Please Note:  You might notice some irritation and congestion in your nose or some drainage.  This is from the oxygen used during your procedure.  There is no need for concern and it should clear up in a day or so.  SYMPTOMS TO REPORT IMMEDIATELY:  Following lower endoscopy (colonoscopy or flexible sigmoidoscopy):  Excessive amounts of blood in the stool  Significant tenderness or worsening of abdominal pains  Swelling of the abdomen that is new, acute  Fever of 100F or higher   For urgent or emergent issues, a gastroenterologist can be reached at any hour by calling [phone]. Do not use MyChart messaging for urgent concerns.    DIET:  We do recommend a small meal at first, but then you may proceed to your regular diet.  Drink plenty of fluids but you should avoid alcoholic beverages for 24 hours.  MEDICATIONS: Continue present medications.  FOLLOW UP: Repeat colonoscopy in 1 year for screening purposes. (SPLIT-DOSE GOLYTELY prep  for next exam).  Thank you for allowing [place] to provide for your healthcare needs today. ACTIVITY:  You should plan to take it easy for the rest of today and you should NOT DRIVE or use heavy machinery until tomorrow (because of the sedation medicines used during the test).    FOLLOW UP: Our staff will call the number listed on your records the next business day following your procedure.  We will call around 7:15- 8:00 am to check on you and address any questions or concerns that you may have regarding the information given to you following your procedure. If we do not reach you, we will leave a message.     If any biopsies were taken you will be contacted by phone or by letter within the next 1-3 weeks.  Please call us at [phone] if you have not heard about the biopsies in 3 weeks.    SIGNATURES/CONFIDENTIALITY: You and/or your care partner have signed paperwork which will be entered into your electronic medical record.  These signatures attest to the fact that that the information above on your After Visit Summary has been reviewed and is understood.  Full responsibility of the confidentiality of this discharge information lies with you and/or your care-partner.

## 2022-12-26 NOTE — Progress Notes (Signed)
Pt's states no medical or surgical changes since previsit or office visit. 

## 2022-12-26 NOTE — Progress Notes (Signed)
History and Physical:  This patient presents for endoscopic testing for: Encounter Diagnosis  Name Primary?   Special screening for malignant neoplasms, colon Yes    Average risk for CRC - first screening exam. Patient denies chronic abdominal pain, rectal bleeding, constipation or diarrhea.   Patient is otherwise without complaints or active issues today.   Past Medical History: Past Medical History:  Diagnosis Date   Diabetes mellitus without complication ([place])    Tuberculosis    tested positive,treated with medications.     Past Surgical History: Past Surgical History:  Procedure Laterality Date   FOOT SURGERY Left    I & D EXTREMITY Left 05/05/2022   Procedure: LEFT FOOT DEBRIDEMENT;  Surgeon: Newt Minion, MD;  Location: [facility];  Service: Orthopedics;  Laterality: Left;   MYRINGOTOMY WITH TUBE PLACEMENT Bilateral 07/20/2022   Procedure: MYRINGOTOMY WITH TUBE PLACEMENT;  Surgeon: Margaretha Sheffield, MD;  Location: [facility];  Service: ENT;  Laterality: Bilateral;  Diabetic   TONSILLECTOMY      Allergies: No Known Allergies  Outpatient Meds: Current Outpatient Medications  Medication Sig Dispense Refill   Insulin NPH, Human,, Isophane, (HUMULIN N KWIKPEN) 100 UNIT/ML Kiwkpen Inject 22 Units into the skin 2 (two) times daily. 30 mL 5   metFORMIN (GLUCOPHAGE-XR) 500 MG 24 hr tablet Take 2 tablets (1,000 mg total) by mouth 2 (two) times daily with a meal. 120 tablet 6   acetaminophen (TYLENOL) 500 MG tablet Take 2,000 mg by mouth every 6 (six) hours as needed for mild pain or moderate pain. (Patient not taking: Reported on 12/07/2022)     amLODipine (NORVASC) 5 MG tablet Take 1 tablet (5 mg total) by mouth daily. (Patient not taking: Reported on 12/26/2022) 90 tablet 1   atorvastatin (LIPITOR) 20 MG tablet Take 1 tablet (20 mg total) by mouth daily. (Patient not taking: Reported on 12/26/2022) 90 tablet 1   Continuous Blood Gluc Transmit (DEXCOM G6 TRANSMITTER) MISC Use  to check blood sugar three times daily. Change transmitter once every 909 days. E11.69 (Patient not taking: Reported on 12/07/2022) 1 each 1   Dulaglutide (TRULICITY) 1.5 DE/0.8XK SOPN Inject 1.5 mg into the skin once a week. 6 mL 6   gabapentin (NEURONTIN) 300 MG capsule Take 1 capsule (300 mg total) by mouth at bedtime. (Patient not taking: Reported on 12/07/2022) 30 capsule 3   Insulin Pen Needle 32G X 4 MM MISC Use as directed to inject insulin up to 4 times daily. (Patient not taking: Reported on 12/12/2022) 100 each 0   methocarbamol (ROBAXIN) 500 MG tablet Take 1 tablet (500 mg total) by mouth every 6 (six) hours as needed for muscle spasms. (Patient not taking: Reported on 12/07/2022) 30 tablet 0   Current Facility-Administered Medications  Medication Dose Route Frequency Provider Last Rate Last Admin   0.9 %  sodium chloride infusion  500 mL Intravenous Continuous Nelida Meuse III, MD          ___________________________________________________________________ Objective   Exam:  BP (!) 101/43   Pulse 100   Temp 98.2 F (36.8 C)   Ht 5\' 10"  (1.778 m)   Wt 254 lb (115.2 kg)   SpO2 99%   BMI 36.45 kg/m   CV: regular , S1/S2 Resp: clear to auscultation bilaterally, normal RR and effort noted GI: soft, no tenderness, with active bowel sounds.   Assessment: Encounter Diagnosis  Name Primary?   Special screening for malignant neoplasms, colon Yes     Plan:  Colonoscopy  The benefits and risks of the planned procedure were described in detail with the patient or (when appropriate) their health care proxy.  Risks were outlined as including, but not limited to, bleeding, infection, perforation, adverse medication reaction leading to cardiac or pulmonary decompensation, pancreatitis (if ERCP).  The limitation of incomplete mucosal visualization was also discussed.  No guarantees or warranties were given.    The patient is appropriate for an endoscopic procedure in the ambulatory  setting.   - Wilfrid Lund, MD

## 2022-12-26 NOTE — Op Note (Signed)
[facility] Patient Name: [name] Procedure Date: 12/26/2022 1:18 PM MRN: [id number] Endoscopist: Sherilyn Cooter L. Myrtie Neither , MD, [id number] Age: 57 Referring MD:  Date of Birth: February 09, 1966 Gender: Male Account #: [id number] Procedure:                Colonoscopy Indications:              Screening for colorectal malignant neoplasm, This                            is the patient's first colonoscopy Medicines:                Monitored Anesthesia Care Procedure:                Pre-Anesthesia Assessment:                           - Prior to the procedure, a History and Physical                            was performed, and patient medications and                            allergies were reviewed. The patient's tolerance of                            previous anesthesia was also reviewed. The risks                            and benefits of the procedure and the sedation                            options and risks were discussed with the patient.                            All questions were answered, and informed consent                            was obtained. Prior Anticoagulants: The patient has                            taken no anticoagulant or antiplatelet agents. ASA                            Grade Assessment: III - A patient with severe                            systemic disease. After reviewing the risks and                            benefits, the patient was deemed in satisfactory                            condition to undergo the procedure.  After obtaining informed consent, the colonoscope                            was passed under direct vision. Throughout the                            procedure, the patient's blood pressure, pulse, and                            oxygen saturations were monitored continuously. The                            Olympus CF-HQ190L (Serial# [id number]) Colonoscope was                            introduced through the  anus and advanced to the the                            cecum, identified by appendiceal orifice and                            ileocecal valve. The colonoscopy was somewhat                            difficult due to fair to poor bowel prep, a                            redundant colon, significant looping and the                            patient's body habitus. Successful completion of                            the procedure was aided by changing the patient to                            a semi-prone position, using manual pressure,                            straightening and shortening the scope to obtain                            bowel loop reduction and lavage. The patient                            tolerated the procedure well. The quality of the                            bowel preparation was fair. The ileocecal valve,                            appendiceal orifice, and rectum were photographed.  The bowel preparation used was SUPREP via split                            dose instruction. Scope In: 1:24:57 PM Scope Out: 1:45:52 PM Scope Withdrawal Time: 0 hours 11 minutes 3 seconds  Total Procedure Duration: 0 hours 20 minutes 55 seconds  Findings:                 The perianal and digital rectal examinations were                            normal.                           Semiliquid stool was found in the entire colon.                            Densely adherent in some areas despite lavage.                            Scattered fibrous debris as well.                           The colon (entire examined portion) was redundant.                           The exam was otherwise without abnormality on                            direct and retroflexion views. (Given limitations                            of bowel preparation noted above) Complications:            No immediate complications. Estimated Blood Loss:     Estimated blood loss:  none. Impression:               - Preparation of the colon was fair to poor.                           - Stool in the entire examined colon.                           - Redundant colon.                           - The examination was otherwise normal on direct                            and retroflexion views.                           - No specimens collected. Recommendation:           - Patient has a contact number available for  emergencies. The signs and symptoms of potential                            delayed complications were discussed with the                            patient. Return to normal activities tomorrow.                            Written discharge instructions were provided to the                            patient.                           - Resume previous diet.                           - Continue present medications.                           - Repeat colonoscopy in 1 year for screening                            purposes. (SPLIT-DOSE GOLYTELY prep for next exam) Brynnan Rodenbaugh L. Loletha Carrow, MD 12/26/2022 1:52:57 PM This report has been signed electronically.

## 2022-12-26 NOTE — Progress Notes (Signed)
Report to PACU, RN, vss, BBS= Clear.  

## 2022-12-27 ENCOUNTER — Telehealth: Payer: Self-pay | Admitting: *Deleted

## 2022-12-27 NOTE — Telephone Encounter (Signed)
Attempted to call patient for their post-procedure follow-up call. No answer. Left voicemail.   

## 2023-01-02 ENCOUNTER — Ambulatory Visit: Payer: 59 | Admitting: Physician Assistant

## 2023-01-04 ENCOUNTER — Ambulatory Visit: Payer: 59 | Admitting: Physician Assistant

## 2023-01-09 ENCOUNTER — Encounter: Payer: Medicaid Other | Attending: Physician Assistant | Admitting: Physician Assistant

## 2023-01-09 DIAGNOSIS — L02612 Cutaneous abscess of left foot: Secondary | ICD-10-CM | POA: Diagnosis not present

## 2023-01-09 DIAGNOSIS — F17200 Nicotine dependence, unspecified, uncomplicated: Secondary | ICD-10-CM | POA: Diagnosis not present

## 2023-01-09 DIAGNOSIS — M86672 Other chronic osteomyelitis, left ankle and foot: Secondary | ICD-10-CM | POA: Insufficient documentation

## 2023-01-09 DIAGNOSIS — E1151 Type 2 diabetes mellitus with diabetic peripheral angiopathy without gangrene: Secondary | ICD-10-CM | POA: Diagnosis not present

## 2023-01-09 DIAGNOSIS — L97522 Non-pressure chronic ulcer of other part of left foot with fat layer exposed: Secondary | ICD-10-CM | POA: Diagnosis not present

## 2023-01-09 DIAGNOSIS — L97524 Non-pressure chronic ulcer of other part of left foot with necrosis of bone: Secondary | ICD-10-CM | POA: Diagnosis not present

## 2023-01-09 DIAGNOSIS — E11621 Type 2 diabetes mellitus with foot ulcer: Secondary | ICD-10-CM | POA: Diagnosis not present

## 2023-01-09 DIAGNOSIS — E1165 Type 2 diabetes mellitus with hyperglycemia: Secondary | ICD-10-CM | POA: Diagnosis not present

## 2023-01-09 NOTE — Progress Notes (Signed)
[name], [name] ([id number]) [id number] Page 1 of 3 Visit Report for 01/09/2023 Chief Complaint Document Details Patient Name: Date of Service: [name], [name] 01/09/2023 8:30 A M Medical Record Number: [id number] Patient Account Number: [id number] Date of Birth/Sex: Treating RN: 1966-08-28 (57 y.o. 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 Seward Meth Primary Care Provider: Charlott Rakes Other Clinician: Referring Provider: Treating Provider/Extender: Sherene Sires Weeks in Treatment: 29 Debridement Performed for Assessment: Wound #1 Left,Plantar Foot Performed By: Physician Tommie Sams., PA-C Debridement Type: Debridement Severity of Tissue Pre Debridement: Fat layer exposed Level of Consciousness (Pre-procedure): Awake and Alert Pre-procedure Verification/Time Out Yes - 08:56 Taken: Start Time: 08:56 Pain Control: Lidocaine 4% T opical Solution T Area Debrided (L x W): otal 4.8 (cm) x 6 (cm) = 28.8 (cm) Tissue and other material debrided: Viable, Non-Viable, Callus, Slough, Subcutaneous, Biofilm, Slough Level: Skin/Subcutaneous Tissue Debridement Description: Excisional Instrument: Curette Bleeding: Minimum Hemostasis Achieved: Pressure Response to Treatment: Procedure was  tolerated well Level of Consciousness (Post- Awake and Alert procedure): [employer], [employer] ([id number]) [id number] Page 2 of 3 Post Debridement Measurements of Total Wound Length: (cm) 4.8 Width: (cm) 6 Depth: (cm) 0.3 Volume: (cm) 6.786 Character of Wound/Ulcer Post Debridement: Stable Severity of Tissue Post Debridement: Fat layer exposed Post Procedure Diagnosis Same as Pre-procedure Electronic Signature(s) Unsigned Entered By: Rosalio Loud on 01/09/2023 09:00:01 -------------------------------------------------------------------------------- Physician Orders Details Patient Name: Date of Service: [name], [name] 01/09/2023 8:30 A M Medical Record Number: [id number] Patient Account Number: [id number] Date of Birth/Sex: Treating RN: Jul 30, 1966 (57 y.o. Seward Meth Primary Care Provider: Charlott Rakes Other Clinician: Referring Provider: Treating Provider/Extender: Sherene Sires Weeks in Treatment: 59 Verbal / Phone Orders: No Diagnosis Coding ICD-10 Coding Code Description [id number] Other chronic osteomyelitis, left ankle and foot E11.621 Type 2 diabetes mellitus with foot ulcer L97.524 Non-pressure chronic ulcer of other part of left foot with necrosis of bone Follow-up Appointments Return Appointment in 1 week. Anesthetic (Use 'Patient Medications' Section for Anesthetic Order Entry) Lidocaine applied to wound bed Edema Control - Lymphedema / Segmental Compressive Device / Other Elevate, Exercise Daily and A void Standing for Long Periods of Time. Elevate legs to the level of the heart and pump ankles as often as possible Elevate leg(s) parallel to the floor when sitting. Wound Treatment Wound #1 - Foot Wound Laterality: Plantar, Left Peri-Wound Care: AandD Ointment 1 x Per Day/30 Days Discharge Instructions: Apply AandD Ointment as directed Prim Dressing: Gauze (Generic) 1 x Per Day/30 Days ary Discharge Instructions: As  directed: dry, moistened with saline or moistened with Dakins Solution Prim Dressing: Hydrofera Blue Ready Transfer Foam, 2.5x2.5 (in/in) 1 x Per Day/30 Days ary Discharge Instructions: Apply Hydrofera Blue Ready to wound bed as directed Secondary Dressing: ABD Pad 5x9 (in/in) (Generic) 1 x Per Day/30 Days Discharge Instructions: Cover with ABD pad [name], [name] ([id number]) [id number] Page 3 of 3 Secondary Dressing: Kerlix 4.5 x 4.1 (in/yd) (Generic) 1 x Per Day/30 Days Discharge Instructions: Apply Kerlix 4.5 x 4.1 (in/yd)  as instructed Secured With: Medipore T - 94M Medipore H Soft Cloth Surgical T ape ape, 2x2 (in/yd) (Generic) 1 x Per Day/30 Days Electronic Signature(s) Unsigned Entered By: Rosalio Loud on 01/09/2023 09:00:55 -------------------------------------------------------------------------------- Problem List Details Patient Name: Date of Service: [name], [name] 01/09/2023 8:30 A M Medical Record Number: [id number] Patient Account Number: [id number] Date of Birth/Sex: Treating RN: 1965/12/17 (57 y.o. Seward Meth Primary Care Provider: Charlott Rakes Other Clinician: Referring Provider: Treating Provider/Extender: Sherene Sires Weeks in Treatment: 29 Active Problems ICD-10 Encounter Code Description Active Date MDM Diagnosis [phone] Other chronic osteomyelitis, left ankle and foot 06/15/2022 No Yes E11.621 Type 2 diabetes mellitus with foot ulcer 06/15/2022 No Yes L97.524 Non-pressure chronic ulcer of other part of left foot with necrosis of bone 06/15/2022 No Yes Inactive Problems Resolved Problems Electronic Signature(s) Signed: 01/09/2023 8:52:34 AM By: Worthy Keeler PA-C Entered By: Worthy Keeler on 01/09/2023 08:52:34

## 2023-01-10 NOTE — Progress Notes (Signed)
CHRISTIFER, CHAPDELAINE ([id number]) [id number] Page 1 of 8 Visit Report for 01/09/2023 Arrival Information Details Patient Name: Date of Service: [name], [name] 01/09/2023 8:30 A M Medical Record Number: [id number] Patient Account Number: [id number] Date of Birth/Sex: Treating RN: 06/28/1966 (57 y.o. Seward Meth Primary Care Peregrine Nolt: Charlott [name] Other Clinician: Referring Ryleah Miramontes: Treating Kuper Rennels/Extender: Sherene Sires Weeks in Treatment: 29 Visit Information History Since Last Visit Added or deleted any medications: No Patient Arrived: Ambulatory Any new allergies or adverse reactions: No Arrival Time: 08:40 Had a fall or experienced change in No Accompanied By: self activities of daily living that may affect Transfer Assistance: None risk of falls: Patient Identification Verified: Yes Hospitalized since last visit: No Secondary Verification Process Completed: Yes Has Dressing in Place as Prescribed: Yes Patient Requires Transmission-Based Precautions: No Pain Present Now: No Patient Has Alerts: No Electronic Signature(s) Signed: 01/10/2023 10:28:52 AM By: Rosalio Loud MSN RN CNS WTA Entered By: Rosalio Loud on 01/09/2023 08:40:25 -------------------------------------------------------------------------------- Clinic Level of Care Assessment Details Patient Name: Date of Service: [name], [name] 01/09/2023 8:30 A M Medical Record Number: [id number] Patient Account Number: [id number] Date of Birth/Sex: Treating RN: 10/02/1966 (57 y.o. Seward Meth Primary Care Lisette Mancebo: Charlott [name] Other Clinician: Referring Kyrra Prada: Treating Siddhartha Hoback/Extender: Sherene Sires Weeks in Treatment: 29 Clinic Level of Care Assessment Items TOOL 1 Quantity Score []  - 0 Use when EandM and Procedure is performed on INITIAL visit ASSESSMENTS - Nursing Assessment / Reassessment []  - 0 General Physical Exam (combine w/ comprehensive assessment  (listed just below) when performed on new pt. evals) []  - 0 Comprehensive Assessment (HX, ROS, Risk Assessments, Wounds Hx, etc.) ASSESSMENTS - Wound and Skin Assessment / Reassessment []  - 0 Dermatologic / Skin Assessment (not related to wound area) ASSESSMENTS - Ostomy and/or Continence Assessment and Ferry, Dontea ([id number]) [id number] Page 2 of 8 []  - 0 Incontinence Assessment and Management []  - 0 Ostomy Care Assessment and Management (repouching, etc.) PROCESS - Coordination of Care []  - 0 Simple Patient / Family Education for ongoing care []  - 0 Complex (extensive) Patient / Family Education for ongoing care []  - 0 Staff obtains Programmer, systems, Records, T Results / Process Orders est []  - 0 Staff telephones HHA, Nursing Homes / Clarify orders / etc []  - 0 Routine Transfer to another Facility (non-emergent condition) []  - 0 Routine Hospital Admission (non-emergent condition) []  - 0 New Admissions / Biomedical engineer / Ordering NPWT Apligraf, etc. , []  - 0 Emergency Hospital Admission (emergent condition) PROCESS - Special Needs []  - 0 Pediatric / Minor Patient Management []  - 0 Isolation Patient Management []  - 0 Hearing / Language / Visual special needs []  - 0 Assessment of Community assistance (transportation, D/C planning, etc.) []  - 0 Additional assistance / Altered mentation []  - 0 Support Surface(s) Assessment (bed, cushion, seat, etc.) INTERVENTIONS - Miscellaneous []  - 0 External ear exam []  - 0 Patient Transfer (multiple staff / Civil Service fast streamer / Similar devices) []  - 0 Simple Staple / Suture removal (25 or less) []  - 0 Complex Staple / Suture removal (26 or more) []  - 0 Hypo/Hyperglycemic Management (do not check if billed separately) []  - 0 Ankle / Brachial Index (ABI) - do not check if billed separately Has the patient been seen at the hospital within the last three years: Yes Total Score: 0 Level Of Care:  ____ Electronic Signature(s) Signed: 01/10/2023 10:28:52 AM By: Rosalio Loud MSN RN CNS WTA Entered By: Rosalio Loud on  01/09/2023 09:01:01 -------------------------------------------------------------------------------- Encounter Discharge Information Details Patient Name: Date of Service: [name], [name] 01/09/2023 8:30 A M Medical Record Number: [id number] Patient Account Number: [id number] Date of Birth/Sex: Treating RN: [birth year] (57 y.o. Seward Meth Primary Care Arel Tippen: Charlott [name] Other Clinician: Referring Pearson Picou: Treating Alizae Bechtel/Extender: Sherene Sires Weeks in Treatment: 29 Encounter Discharge Information Items Post Procedure Vitals Discharge Condition: Stable Temperature (F): 98.4 Ecorse, Deylan [id number]) [id number] Page 3 of 8 Ambulatory Status: Ambulatory Pulse (bpm): 99 Discharge Destination: Home Respiratory Rate (breaths/min): 16 Transportation: Private Auto Blood Pressure (mmHg): 150/97 Accompanied By: self Schedule Follow-up Appointment: Yes Clinical Summary of Care: Electronic Signature(s) Signed: 01/10/2023 10:28:52 AM By: Rosalio Loud MSN RN CNS WTA Entered By: Rosalio Loud on 01/09/2023 09:19:09 -------------------------------------------------------------------------------- Lower Extremity Assessment Details Patient Name: Date of Service: [name], [name] 01/09/2023 8:30 A M Medical Record Number: [id number] Patient Account Number: [id number] Date of Birth/Sex: Treating RN: 1966/06/05 (57 y.o. Seward Meth Primary Care Faysal Fenoglio: Charlott [name] Other Clinician: Referring Zayon Trulson: Treating Bella Brummet/Extender: Sherene Sires Weeks in Treatment: 29 Electronic Signature(s) Signed: 01/09/2023 9:15:51 AM By: Rosalio Loud MSN RN CNS WTA Entered By: Rosalio Loud on 01/09/2023 09:15:51 -------------------------------------------------------------------------------- Multi Wound Chart Details Patient Name:  Date of Service: [name], [name] 01/09/2023 8:30 A M Medical Record Number: [id number] Patient Account Number: [id number] Date of Birth/Sex: Treating RN: December 14, 1965 (57 y.o. Seward Meth Primary Care Kiondre Grenz: Charlott [name] Other Clinician: Referring Nena Hampe: Treating Shaelynn Dragos/Extender: Sherene Sires Weeks in Treatment: 29 Vital Signs Height(in): 70 Pulse(bpm): 99 Weight(lbs): 230 Blood Pressure(mmHg): 150/97 Body Mass Index(BMI): 33 Temperature(F): 98.4 Respiratory Rate(breaths/min): 16 [1:Photos:] [N/A:N/A] Left, Plantar Foot N/A N/A Wound Location: Gradually Appeared N/A N/A Wounding Event: Diabetic Wound/Ulcer of the Lower N/A N/A Primary Etiology: Extremity Type II Diabetes N/A N/A Comorbid History: 12/04/2021 N/A N/A Date Acquired: 34 N/A N/A Weeks of Treatment: Open N/A N/A Wound Status: No N/A N/A Wound Recurrence: Yes N/A N/A Pending A mputation on Presentation: 4.8x6x0.2 N/A N/A Measurements L x W x D (cm) 22.619 N/A N/A A (cm) : rea 4.524 N/A N/A Volume (cm) : 77.50% N/A N/A % Reduction in A rea: 91.00% N/A N/A % Reduction in Volume: Grade 4 N/A N/A Classification: Medium N/A N/A Exudate A mount: Serosanguineous N/A N/A Exudate Type: red, brown N/A N/A Exudate Color: Medium (34-66%) N/A N/A Granulation A mount: Pink, Hyper-granulation N/A N/A Granulation Quality: Medium (34-66%) N/A N/A Necrotic A mount: Fat Layer (Subcutaneous Tissue): Yes N/A N/A Exposed Structures: Fascia: No Tendon: No Muscle: No Joint: No Bone: No None N/A N/A Epithelialization: Treatment Notes Electronic Signature(s) Signed: 01/10/2023 10:28:52 AM By: Rosalio Loud MSN RN CNS WTA Entered By: Rosalio Loud on 01/09/2023 08:55:51 -------------------------------------------------------------------------------- Multi-Disciplinary Care Plan Details Patient Name: Date of Service: [name], [name] 01/09/2023 8:30 A M Medical Record Number:  [id number] Patient Account Number: [id number] Date of Birth/Sex: Treating RN: May 08, 1966 (57 y.o. Seward Meth Primary Care Erickson Yamashiro: Charlott [name] Other Clinician: Referring Merion Grimaldo: Treating Brehanna Deveny/Extender: Sherene Sires Weeks in Treatment: 29 Active Inactive Osteomyelitis Nursing Diagnoses: Infection: osteomyelitis Knowledge deficit related to disease process and management Potential for infection: osteomyelitis Goals: Diagnostic evaluation for osteomyelitis completed as ordered [name], [name] ([id number]) [id number] Page 5 of 8 Date Initiated: 06/28/2022 Date Inactivated: 08/03/2022 Target Resolution Date: 06/28/2022 Goal Status: Met Patient/caregiver will verbalize understanding of disease process and disease management Date Initiated: 06/28/2022 Date Inactivated: 08/31/2022 Target Resolution Date: 06/28/2022 Goal Status: Met Patient's osteomyelitis will resolve Date Initiated: 06/28/2022  Target Resolution Date: 12/17/2022 Goal Status: Active Signs and symptoms for osteomyelitis will be recognized and promptly addressed Date Initiated: 06/28/2022 Date Inactivated: 08/03/2022 Target Resolution Date: 06/28/2022 Goal Status: Met Interventions: Assess for signs and symptoms of osteomyelitis resolution every visit Provide education on osteomyelitis Screen for HBO Notes: Electronic Signature(s) Signed: 01/10/2023 10:28:52 AM By: Rosalio Loud MSN RN CNS WTA Entered By: Rosalio Loud on 01/09/2023 09:17:42 -------------------------------------------------------------------------------- Pain Assessment Details Patient Name: Date of Service: [name], [name] 01/09/2023 8:30 A M Medical Record Number: [id number] Patient Account Number: [id number] Date of Birth/Sex: Treating RN: 08-25-1966 (57 y.o. Seward Meth Primary Care Keasia Dubose: Charlott [name] Other Clinician: Referring Kadelyn Dimascio: Treating Cadi Rhinehart/Extender: Sherene Sires Weeks in Treatment: 29 Active Problems Location of Pain Severity and Description of Pain Patient Has Paino No Site Locations Pain Management and Medication Current Pain Management: Electronic Signature(s) [place], Sakai ([id number]) [id number] Page 6 of 8 Signed: 01/10/2023 10:28:52 AM By: Rosalio Loud MSN RN CNS WTA Entered By: Rosalio Loud on 01/09/2023 08:42:45 -------------------------------------------------------------------------------- Patient/Caregiver Education Details Patient Name: Date of Service: [name], [name] 2/6/2024andnbsp8:30 A M Medical Record Number: [id number] Patient Account Number: [id number] Date of Birth/Gender: Treating RN: [birth year] (57 y.o. Seward Meth Primary Care Physician: Charlott [name] Other Clinician: Referring Physician: Treating Physician/Extender: Sherene Sires Weeks in Treatment: 35 Education Assessment Education Provided To: Patient Education Topics Provided Wound/Skin Impairment: Handouts: Caring for Your Ulcer Methods: Explain/Verbal Responses: State content correctly Electronic Signature(s) Signed: 01/10/2023 10:28:52 AM By: Rosalio Loud MSN RN CNS WTA Entered By: Rosalio Loud on 01/09/2023 09:17:32 -------------------------------------------------------------------------------- Wound Assessment Details Patient Name: Date of Service: [name], [name] 01/09/2023 8:30 A M Medical Record Number: [id number] Patient Account Number: [id number] Date of Birth/Sex: Treating RN: 1966/10/18 (57 y.o. Seward Meth Primary Care Mohammad Granade: Charlott [name] Other Clinician: Referring Leiya Keesey: Treating Laiken Nohr/Extender: Sherene Sires Weeks in Treatment: 29 Wound Status Wound Number: 1 Primary Etiology: Diabetic Wound/Ulcer of the Lower Extremity Wound Location: Left, Plantar Foot Wound Status: Open Wounding Event: Gradually Appeared Comorbid History: Type II Diabetes Date Acquired:  12/04/2021 Weeks Of Treatment: 29 Clustered Wound: No Pending Amputation On Presentation [name], [name] ([id number]) [id number] Page 7 of 8 Photos Wound Measurements Length: (cm) 4.8 Width: (cm) 6 Depth: (cm) 0.2 Area: (cm) 22.619 Volume: (cm) 4.524 % Reduction in Area: 77.5% % Reduction in Volume: 91% Epithelialization: None Wound Description Classification: Grade 4 Exudate Amount: Medium Exudate Type: Serosanguineous Exudate Color: red, brown Foul Odor After Cleansing: No Slough/Fibrino Yes Wound Bed Granulation Amount: Medium (34-66%) Exposed Structure Granulation Quality: Pink, Hyper-granulation Fascia Exposed: No Necrotic Amount: Medium (34-66%) Fat Layer (Subcutaneous Tissue) Exposed: Yes Necrotic Quality: Adherent Slough Tendon Exposed: No Muscle Exposed: No Joint Exposed: No Bone Exposed: No Treatment Notes Wound #1 (Foot) Wound Laterality: Plantar, Left Cleanser Peri-Wound Care AandD Ointment Discharge Instruction: Apply AandD Ointment as directed Topical Primary Dressing Gauze Discharge Instruction: As directed: dry, moistened with saline or moistened with Dakins Solution Hydrofera Blue Ready Transfer Foam, 2.5x2.5 (in/in) Discharge Instruction: Apply Hydrofera Blue Ready to wound bed as directed Secondary Dressing ABD Pad 5x9 (in/in) Discharge Instruction: Cover with ABD pad Kerlix 4.5 x 4.1 (in/yd) Discharge Instruction: Apply Kerlix 4.5 x 4.1 (in/yd) as instructed Secured With Medipore T - 63M Medipore H Soft Cloth Surgical T ape ape, 2x2 (in/yd) Compression Wrap Compression Stockings Add-Ons Electronic Signature(s) Signed: 01/10/2023 10:28:52 AM By: Rosalio Loud MSN RN CNS WTA Entered By: Rosalio Loud on 01/09/2023 09:03:26 [place]anton, Truman Hayward ([id number]) [id number] Page  8 of 8 -------------------------------------------------------------------------------- Vitals Details Patient Name: Date of  Service: ANDERS, HOHMANN 01/09/2023 8:30 A M Medical Record Number: [id number] Patient Account Number: [id number] Date of Birth/Sex: Treating RN: [birth year] (57 y.o. [name] Primary Care Jolaine Fryberger: [name] Other Clinician: Referring Farrie Sann: Treating Wyeth Hoffer/Extender: Bonnye Fava Weeks in Treatment: 29 Vital Signs Time Taken: 08:40 Temperature (F): 98.4 Height (in): 70 Pulse (bpm): 99 Weight (lbs): 230 Respiratory Rate (breaths/min): 16 Body Mass Index (BMI): 33 Blood Pressure (mmHg): 150/97 Reference Range: 80 - 120 mg / dl Electronic Signature(s) Signed: 01/10/2023 10:28:52 AM By: Midge Aver MSN RN CNS WTA Entered By: Midge Aver on 01/09/2023 08:42:39

## 2023-01-16 ENCOUNTER — Encounter: Payer: Medicaid Other | Admitting: Physician Assistant

## 2023-01-16 DIAGNOSIS — E1165 Type 2 diabetes mellitus with hyperglycemia: Secondary | ICD-10-CM | POA: Diagnosis not present

## 2023-01-16 DIAGNOSIS — L97524 Non-pressure chronic ulcer of other part of left foot with necrosis of bone: Secondary | ICD-10-CM | POA: Diagnosis not present

## 2023-01-16 DIAGNOSIS — E11621 Type 2 diabetes mellitus with foot ulcer: Secondary | ICD-10-CM | POA: Diagnosis not present

## 2023-01-16 DIAGNOSIS — E1151 Type 2 diabetes mellitus with diabetic peripheral angiopathy without gangrene: Secondary | ICD-10-CM | POA: Diagnosis not present

## 2023-01-16 DIAGNOSIS — F17200 Nicotine dependence, unspecified, uncomplicated: Secondary | ICD-10-CM | POA: Diagnosis not present

## 2023-01-16 DIAGNOSIS — M86672 Other chronic osteomyelitis, left ankle and foot: Secondary | ICD-10-CM | POA: Diagnosis not present

## 2023-01-16 DIAGNOSIS — L02612 Cutaneous abscess of left foot: Secondary | ICD-10-CM | POA: Diagnosis not present

## 2023-01-16 DIAGNOSIS — L97522 Non-pressure chronic ulcer of other part of left foot with fat layer exposed: Secondary | ICD-10-CM | POA: Diagnosis not present

## 2023-01-17 ENCOUNTER — Other Ambulatory Visit: Payer: Self-pay

## 2023-01-17 NOTE — Progress Notes (Addendum)
FARZAN, SALOPEK ([id number]) [id number] Page 1 of 9 Visit Report for 01/16/2023 Arrival Information Details Patient Name: Date of Service: [name], [name] 01/16/2023 3:45 PM Medical Record Number: [id number] Patient Account Number: [id number] Date of Birth/Sex: Treating RN: 24-Dec-1965 (57 y.o. [name]) Carlene Coria Primary Care Josejuan Hoaglin: Charlott Rakes Other Clinician: Referring Tiara Maultsby: Treating Tacha Manni/Extender: Sherene Sires Weeks in Treatment: 30 Visit Information History Since Last Visit Added or deleted any medications: No Patient Arrived: Ambulatory Any new allergies or adverse reactions: No Arrival Time: 15:50 Had a fall or experienced change in No Accompanied By: self activities of daily living that may affect Transfer Assistance: None risk of falls: Patient Identification Verified: Yes Signs or symptoms of abuse/neglect since last visito No Secondary Verification Process Completed: Yes Hospitalized since last visit: No Patient Requires Transmission-Based Precautions: No Implantable device outside of the clinic excluding No Patient Has Alerts: No cellular tissue based products placed in the center since last visit: Has Dressing in Place as Prescribed: Yes Pain Present Now: No Electronic Signature(s) Signed: 01/16/2023 4:20:55 PM By: Carlene Coria RN Entered By: Carlene Coria on 01/16/2023 15:54:21 -------------------------------------------------------------------------------- Clinic Level of Care Assessment Details Patient Name: Date of Service: [name], [name] 01/16/2023 3:45 PM Medical Record Number: [id number] Patient Account Number: [id number] Date of Birth/Sex: Treating RN: [birth year] (57 y.o. [name] Primary Care Kevontay Burks: Charlott Rakes Other Clinician: Referring Jhanae Jaskowiak: Treating Mellisa Arshad/Extender: Sherene Sires Weeks in Treatment: 30 Clinic Level of Care Assessment Items TOOL 4 Quantity Score X- 1 0 Use  when only an EandM is performed on FOLLOW-UP visit ASSESSMENTS - Nursing Assessment / Reassessment []$  - 0 Reassessment of Co-morbidities (includes updates in patient status) []$  - 0 Reassessment of Adherence to Treatment Plan COHEA, [place] ([id number]) [phone].pdf Page 2 of 9 ASSESSMENTS - Wound and Skin A ssessment / Reassessment X - Simple Wound Assessment / Reassessment - one wound 1 5 []$  - 0 Complex Wound Assessment / Reassessment - multiple wounds []$  - 0 Dermatologic / Skin Assessment (not related to wound area) ASSESSMENTS - Focused Assessment []$  - 0 Circumferential Edema Measurements - multi extremities []$  - 0 Nutritional Assessment / Counseling / Intervention []$  - 0 Lower Extremity Assessment (monofilament, tuning fork, pulses) []$  - 0 Peripheral Arterial Disease Assessment (using hand held doppler) ASSESSMENTS - Ostomy and/or Continence Assessment and Care []$  - 0 Incontinence Assessment and Management []$  - 0 Ostomy Care Assessment and Management (repouching, etc.) PROCESS - Coordination of Care X - Simple Patient / Family Education for ongoing care 1 15 []$  - 0 Complex (extensive) Patient / Family Education for ongoing care []$  - 0 Staff obtains Programmer, systems, Records, T Results / Process Orders est []$  - 0 Staff telephones HHA, Nursing Homes / Clarify orders / etc []$  - 0 Routine Transfer to another Facility (non-emergent condition) []$  - 0 Routine Hospital Admission (non-emergent condition) []$  - 0 New Admissions / Biomedical engineer / Ordering NPWT Apligraf, etc. , []$  - 0 Emergency Hospital Admission (emergent condition) X- 1 10 Simple Discharge Coordination []$  - 0 Complex (extensive) Discharge Coordination PROCESS - Special Needs []$  - 0 Pediatric / Minor Patient Management []$  - 0 Isolation Patient Management []$  - 0 Hearing / Language / Visual special needs []$  - 0 Assessment of Community assistance (transportation, D/C planning,  etc.) []$  - 0 Additional assistance / Altered mentation []$  - 0 Support Surface(s) Assessment (bed, cushion, seat, etc.) INTERVENTIONS - Wound Cleansing / Measurement X - Simple Wound Cleansing - one wound 1 5 []$  -  0 Complex Wound Cleansing - multiple wounds X- 1 5 Wound Imaging (photographs - any number of wounds) []$  - 0 Wound Tracing (instead of photographs) X- 1 5 Simple Wound Measurement - one wound []$  - 0 Complex Wound Measurement - multiple wounds INTERVENTIONS - Wound Dressings X - Small Wound Dressing one or multiple wounds 1 10 []$  - 0 Medium Wound Dressing one or multiple wounds []$  - 0 Large Wound Dressing one or multiple wounds []$  - 0 Application of Medications - topical []$  - 0 Application of Medications - injection INTERVENTIONS - Miscellaneous []$  - 0 External ear exam [name], [name] ([id number]) [id number].pdf Page 3 of 9 []$  - 0 Specimen Collection (cultures, biopsies, blood, body fluids, etc.) []$  - 0 Specimen(s) / Culture(s) sent or taken to Lab for analysis []$  - 0 Patient Transfer (multiple staff / Harrel Lemon Lift / Similar devices) []$  - 0 Simple Staple / Suture removal (25 or less) []$  - 0 Complex Staple / Suture removal (26 or more) []$  - 0 Hypo / Hyperglycemic Management (close monitor of Blood Glucose) []$  - 0 Ankle / Brachial Index (ABI) - do not check if billed separately X- 1 5 Vital Signs Has the patient been seen at the hospital within the last three years: Yes Total Score: 60 Level Of Care: New/Established - Level 2 Electronic Signature(s) Signed: 01/16/2023 4:20:55 PM By: Carlene Coria RN Entered By: Carlene Coria on 01/16/2023 16:08:25 -------------------------------------------------------------------------------- Encounter Discharge Information Details Patient Name: Date of Service: [name], [name] 01/16/2023 3:45 PM Medical Record Number: [id number] Patient Account Number: [id number] Date of Birth/Sex: Treating RN: 1966/09/10 (57  y.o. [name] Primary Care Mirranda Monrroy: Charlott Rakes Other Clinician: Referring Ailey Wessling: Treating Keyonta Barradas/Extender: Sherene Sires Weeks in Treatment: 30 Encounter Discharge Information Items Discharge Condition: Stable Ambulatory Status: Ambulatory Discharge Destination: Home Transportation: Private Auto Accompanied By: self Schedule Follow-up Appointment: Yes Clinical Summary of Care: Electronic Signature(s) Signed: 01/16/2023 4:20:55 PM By: Carlene Coria RN Entered By: Carlene Coria on 01/16/2023 16:09:03 -------------------------------------------------------------------------------- Lower Extremity Assessment Details Patient Name: Date of Service: [name], [name] 01/16/2023 3:45 PM Hassell Done, Truman Hayward ([id number]) [phone].pdf Page 4 of 9 Medical Record Number: [id number] Patient Account Number: [id number] Date of Birth/Sex: Treating RN: 08/30/1966 (57 y.o. [name]) Carlene Coria Primary Care Edlin Ford: Charlott Rakes Other Clinician: Referring Olina Melfi: Treating Braley Luckenbaugh/Extender: Elonda Husky, Enobong Weeks in Treatment: 30 Electronic Signature(s) Signed: 01/16/2023 4:20:55 PM By: Carlene Coria RN Entered By: Carlene Coria on 01/16/2023 16:00:52 -------------------------------------------------------------------------------- Multi Wound Chart Details Patient Name: Date of Service: [name], [name] 01/16/2023 3:45 PM Medical Record Number: [id number] Patient Account Number: [id number] Date of Birth/Sex: Treating RN: Jan 05, 1966 (57 y.o. [name] Primary Care Levern Kalka: Charlott Rakes Other Clinician: Referring Charlsie Fleeger: Treating Azalee Weimer/Extender: Elonda Husky, Enobong Weeks in Treatment: 30 Vital Signs Height(in): 70 Pulse(bpm): 84 Weight(lbs): 230 Blood Pressure(mmHg): 156/92 Body Mass Index(BMI): 33 Temperature(F): 98 Respiratory Rate(breaths/min): 18 [1:Photos:] [N/A:N/A] Left, Plantar Foot N/A N/A Wound  Location: Gradually Appeared N/A N/A Wounding Event: Diabetic Wound/Ulcer of the Lower N/A N/A Primary Etiology: Extremity Type II Diabetes N/A N/A Comorbid History: 12/04/2021 N/A N/A Date Acquired: 30 N/A N/A Weeks of Treatment: Open N/A N/A Wound Status: No N/A N/A Wound Recurrence: Yes N/A N/A Pending A mputation on Presentation: 5.3x6x0.2 N/A N/A Measurements L x W x D (cm) 24.976 N/A N/A A (cm) : rea 4.995 N/A N/A Volume (cm) : 75.20% N/A N/A % Reduction in A rea: 90.10% N/A N/A % Reduction in Volume: Grade 4 N/A N/A  Classification: Medium N/A N/A Exudate A mount: Serosanguineous N/A N/A Exudate Type: red, brown N/A N/A Exudate Color: Medium (34-66%) N/A N/A Granulation A mount: Pink, Hyper-granulation N/A N/A Granulation Quality: Medium (34-66%) N/A N/A Necrotic A mount: Fat Layer (Subcutaneous Tissue): Yes N/A N/A Exposed Structures: Fascia: No Tendon: No Demasi, [place] ([id number]) [phone].pdf Page 5 of 9 Muscle: No Joint: No Bone: No None N/A N/A Epithelialization: Treatment Notes Electronic Signature(s) Signed: 01/16/2023 4:20:55 PM By: Carlene Coria RN Entered By: Carlene Coria on 01/16/2023 16:01:04 -------------------------------------------------------------------------------- Multi-Disciplinary Care Plan Details Patient Name: Date of Service: [name], [name] 01/16/2023 3:45 PM Medical Record Number: [id number] Patient Account Number: [id number] Date of Birth/Sex: Treating RN: 1966/09/12 (57 y.o. [name] Primary Care Aquiles Ruffini: Charlott Rakes Other Clinician: Referring Sherin Murdoch: Treating Cyanna Neace/Extender: Elonda Husky, Enobong Weeks in Treatment: 30 Active Inactive Osteomyelitis Nursing Diagnoses: Infection: osteomyelitis Knowledge deficit related to disease process and management Potential for infection: osteomyelitis Goals: Diagnostic evaluation for osteomyelitis completed as ordered Date  Initiated: 06/28/2022 Date Inactivated: 08/03/2022 Target Resolution Date: 06/28/2022 Goal Status: Met Patient/caregiver will verbalize understanding of disease process and disease management Date Initiated: 06/28/2022 Date Inactivated: 08/31/2022 Target Resolution Date: 06/28/2022 Goal Status: Met Patient's osteomyelitis will resolve Date Initiated: 06/28/2022 Target Resolution Date: 12/17/2022 Goal Status: Active Signs and symptoms for osteomyelitis will be recognized and promptly addressed Date Initiated: 06/28/2022 Date Inactivated: 08/03/2022 Target Resolution Date: 06/28/2022 Goal Status: Met Interventions: Assess for signs and symptoms of osteomyelitis resolution every visit Provide education on osteomyelitis Screen for HBO Notes: Electronic Signature(s) Signed: 01/16/2023 4:20:55 PM By: Carlene Coria RN Entered By: Carlene Coria on 01/16/2023 [place], [place] ([id number]) [id number] Page 6 of 9 -------------------------------------------------------------------------------- Pain Assessment Details Patient Name: Date of Service: [name], [name] 01/16/2023 3:45 PM Medical Record Number: [id number] Patient Account Number: [id number] Date of Birth/Sex: Treating RN: Oct 11, 1966 (57 y.o. [name]) Carlene Coria Primary Care Affie Gasner: Charlott Rakes Other Clinician: Referring Pansy Ostrovsky: Treating Montell Leopard/Extender: Sherene Sires Weeks in Treatment: 30 Active Problems Location of Pain Severity and Description of Pain Patient Has Paino No Site Locations Pain Management and Medication Current Pain Management: Electronic Signature(s) Signed: 01/16/2023 4:20:55 PM By: Carlene Coria RN Entered By: Carlene Coria on 01/16/2023 15:54:46 -------------------------------------------------------------------------------- Patient/Caregiver Education Details Patient Name: Date of Service: [name], [name] 2/13/2024andnbsp3:45 PM Medical Record Number: [id number] Patient  Account Number: [id number] Date of Birth/Gender: Treating RN: 1966/09/01 (57 y.o. [name] Primary Care Physician: Charlott Rakes Other Clinician: Referring Physician: Treating Physician/Extender: Sherene Sires Weeks in Treatment: [address], [place] ([id number]) [id number] Page 7 of 9 Education Assessment Education Provided To: Patient Education Topics Provided Wound/Skin Impairment: Methods: Explain/Verbal Responses: State content correctly Electronic Signature(s) Signed: 01/16/2023 4:20:55 PM By: Carlene Coria RN Entered By: Carlene Coria on 01/16/2023 16:01:47 -------------------------------------------------------------------------------- Wound Assessment Details Patient Name: Date of Service: [name], [name] 01/16/2023 3:45 PM Medical Record Number: [id number] Patient Account Number: [id number] Date of Birth/Sex: Treating RN: 1966-02-10 (57 y.o. [name]) Carlene Coria Primary Care Riot Barrick: Charlott Rakes Other Clinician: Referring Jaime Grizzell: Treating Stonewall Doss/Extender: Elonda Husky, Enobong Weeks in Treatment: 30 Wound Status Wound Number: 1 Primary Etiology: Diabetic Wound/Ulcer of the Lower Extremity Wound Location: Left, Plantar Foot Wound Status: Open Wounding Event: Gradually Appeared Comorbid History: Type II Diabetes Date Acquired: 12/04/2021 Weeks Of Treatment: 30 Clustered Wound: No Pending Amputation On Presentation Photos Wound Measurements Length: (cm) 5.3 Width: (cm) 6 Depth: (cm) 0.2 Area: (cm) 24.976 Volume: (cm) 4.995 % Reduction in Area: 75.2% % Reduction in Volume:  90.1% Epithelialization: None Tunneling: No Undermining: No Wound Description Classification: Grade 4 Exudate Amount: Medium Exudate Type: Serosanguineous [name], [name] ([id number]) Exudate Color: red, brown Foul Odor After Cleansing: No Slough/Fibrino Yes [phone].pdf Page 8 of 9 Wound Bed Granulation Amount:  Medium (34-66%) Exposed Structure Granulation Quality: Pink, Hyper-granulation Fascia Exposed: No Necrotic Amount: Medium (34-66%) Fat Layer (Subcutaneous Tissue) Exposed: Yes Necrotic Quality: Adherent Slough Tendon Exposed: No Muscle Exposed: No Joint Exposed: No Bone Exposed: No Treatment Notes Wound #1 (Foot) Wound Laterality: Plantar, Left Cleanser Peri-Wound Care AandD Ointment Discharge Instruction: Apply AandD Ointment as directed Topical Primary Dressing Hydrofera Blue Ready Transfer Foam, 2.5x2.5 (in/in) Discharge Instruction: Apply Hydrofera Blue Ready to wound bed as directed Secondary Dressing ABD Pad 5x9 (in/in) Discharge Instruction: Cover with ABD pad Kerlix 4.5 x 4.1 (in/yd) Discharge Instruction: Apply Kerlix 4.5 x 4.1 (in/yd) as instructed Secured With Medipore T - 4M Medipore H Soft Cloth Surgical T ape ape, 2x2 (in/yd) Compression Wrap Compression Stockings Add-Ons Electronic Signature(s) Signed: 01/16/2023 4:20:55 PM By: Carlene Coria RN Entered By: Carlene Coria on 01/16/2023 16:00:18 -------------------------------------------------------------------------------- Vitals Details Patient Name: Date of Service: Matthew [name], LESER 01/16/2023 3:45 PM Medical Record Number: [id number] Patient Account Number: [id number] Date of Birth/Sex: Treating RN: 1966-10-05 (57 y.o. [name]) Carlene Coria Primary Care [place]: Charlott Rakes Other Clinician: Referring Atira Borello: Treating Matty Vanroekel/Extender: Elonda Husky, Enobong Weeks in Treatment: 30 Vital Signs Time Taken: 15:54 Temperature (F): 98 Height (in): 70 Pulse (bpm): 84 Weight (lbs): 230 Respiratory Rate (breaths/min): 18 Body Mass Index (BMI): 33 Blood Pressure (mmHg): 156/92 Reference Range: 80 - 120 mg / dl [place], [place] ([id number]) [phone].pdf Page 9 of 9 Electronic Signature(s) Signed: 01/16/2023 4:20:55 PM By: Carlene Coria RN Entered By: Carlene Coria on 01/16/2023  15:54:39

## 2023-01-17 NOTE — Progress Notes (Addendum)
AYAAN, [name] ([id number]) [id number] Page 1 of 9 Visit Report for 01/16/2023 Chief Complaint Document Details Patient Name: Date of Service: [name], [name] 01/16/2023 3:45 PM Medical Record Number: [id number] Patient Account Number: [id number] Date of Birth/Sex: Treating RN: 1966/04/27 (57 y.o. 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 [name] Primary Care Provider: Charlott Rakes Other Clinician: Referring Provider: Treating Provider/Extender: Sherene Sires Weeks in Treatment: 30 History of Present Illness HPI Description: Notes from [facility] under Dr. Glenford Peers care:   ADMISSION6/15/2023This is a 79 year old poorly controlled type II diabetic (last A1c 11.5%). In January of this year, he presented to the hospital with an abscess in his left foot. He also had a foreign body present. It was removed in the ER and he was admitted to the hospital for IV antibiotics. He was subsequently discharged on oral antibiotics. Due to financial concerns, he has not taken his diabetic medications (metformin and 70/30 insulin) and as a result, presented back to the emergency room on June 1 with worsening findings, including frank pus and osteomyelitis on MRI. BKA was recommended, but he  declined. He was taken to the operating room by Dr. Sharol Given who performed an extensive debridement. Cultures were taken and he was placed on IV antibiotics. He saw infectious disease while in the hospital who prescribed a 6-week course of IV antibiotics including cefazolin as well as oral metronidazole and levofloxacin. The infectious disease provider also indicated that he would benefit from below-knee amputation, but the patient desired another opinion and therefore he has been referred to wound care center for further evaluation and management. ABIs obtained while he was in the hospital demonstrate adequate blood flow. Essentially the entire bottom of the patient's left foot has been removed. The heads of the majority of his metatarsal bones are exposed along with their accompanying tendons. Muscle and fat are exposed as well. The wound surface is fairly dry; the patient reports that he was not given any specific wound care instructions other than to place a dry dressing on it after showering. There is no odor or purulent drainage identified. Matthew, [name] ([id number]) [id number] Page 2 of 9 05/26/2022: The wound measures a little bit smaller today. There is some good granulation tissue beginning to form on the surface. He still has exposed bone and tendon. There is some slough accumulation. He saw infectious disease earlier this week and was frustrated that they recommended amputation. He is interested in another surgical opinion. He continues to smoke but says that he has cut down. He is using a knee scooter for mobility so that he can stay off of his foot. 06/05/2022: He saw Dr. Sharol Given last week who was supportive of our efforts to try to salvage the foot. I referred him to vascular surgery to see Dr. Carlis Abbott but he has not received an appointment yet. He continues on IV antibiotics. He says that after our conversation last week about him being unable to initiate hyperbarics if  he was going to continue to smoke, he  has not had a cigarette since. He is now at 4 weeks of conventional management and his insurance was initiated on July 1. Today, the wound continues to show evidence of improvement. There is good granulation tissue forming. The metatarsals are still exposed but actually have some pink periosteum. There is still some slough and fibrinous exudate present. No odor or purulent drainage. We have just been using Dakin's dressing changes for now. Admission to [place] [facility]: 06-15-2022 upon evaluation today patient presents for initial inspection here in our clinic though he has been seen by Dr. Celine Ahr in [place] up to this point. I did review her notes and records as well they have been using Dakin's moistened gauze dressing which has done a great job at helping to clean out the bottom of his foot. Please see the discourse above for additional information in regard to treatment course up until the patient arrives here in the [place] clinic today. The patient is ready to get started with HBO therapy as soon as possible I do think based on all my review of his records this seems to be appropriate he does have chronic osteomyelitis is also been offered amputation this is obviously a limb salvage operation here. We are trying to prevent him from having to undergo an extensive amputation which would likely be a below-knee amputation which in turn is going to affect his quality of life he wants to try to avoid that if at all possible I think hyperbaric oxygen therapy is his best bet to achieve that goal. Currently he has been on IV Ancef. That actually ends tomorrow. Following that according to notes it appears he supposed to be taking Levaquin and Flagyl for an additional period of time although I am not certain exactly how long that with the. He has been seeing regional physicians infectious disease in [place] and again that so I recommended that he  contact today in order to find out what the plan is going forward. His most recent hemoglobin A1c as noted above was 11.5 on 05-07-2022 he tells me trying to work on that he is also quit smoking as of this point. 06-22-2022 upon evaluation today patient appears to be doing well currently in regard to his wound all things considered. He is showing signs of a lot of new granulation tissue and overall very pleased. He is changing this once a day with the Dakin's moistened gauze dressing. Fortunately I do not see any evidence of infection locally or systemically at this time. We have been trying to get him into the hyperbaric chamber he was having a lot of issues with sinus pressure and we have made referral and called multiple ENT specialist the earliest we have been able to get his August 9 at [facility] ear nose throat. They are going to try to work him in sooner if they have any opportunities to do so. Obviously I think the sooner he can do this the better. We need to get him in hyperbarics as quickly as possible. 7/26; patient presents for follow-up. He has been using Dakin's wet-to-dry dressings and oil emollient dressing over the exposed bone. He currently denies systemic signs of infection. He states he is scheduled to see infectious disease, Dr Candiss Norse next week. She had ordered an MRI of the left foot and he is scheduled to have this done tomorrow. He currently denies systemic signs of infection Including fever/chills, nausea/vomiting increased warmth or erythema to the wound bed or purulent drainage. He  started hyperbaric oxygen treatment however did not tolerate this due to sinus pressure. He is scheduled to see ENT in 2 weeks for evaluation. He is not continuing HBO until he is cleared by ENT. 07-06-2022 patient presents today for follow-up concerning his plantar foot ulcer. The good news is this actually is significantly better compared to previous. Some of the bone which is necrotic is starting to  slough off and I am going to perform debridement to clear this away today. Nonetheless I do think that the patient is going to need hyperbarics and we need to try to get it as soon as possible. He still having a lot of sinus pressure I can even hear the congestion today. He is on 3 different antibiotics cefadroxil, metronidazole, and Levaquin currently for the osteomyelitis. For that reason this should actually help with the infection if he does have a chronic sinusitis I am also going to see about getting him on steroids to try to see if this can benefit him as well. He voiced understanding and he is in agreement with giving that shot. This is probably can to spike his blood sugars which I discussed with him he is going to be seeing his primary care provider later today I want him to let her know as well what is going on and why so that she could be aware but I really do think he needs to take the prednisone. We need to get him in the chamber as soon as possible to try to help with getting this wound to heal and closed so that he does not lose his foot. 07-13-2022 upon evaluation today patient appears to be doing better in regard to his wound he continues to show signs of new granulation am going to perform some debridement today but overall this is doing quite well. 07-20-2022 upon evaluation today patient's wound is actually showing some signs of improvement he does have 1 area on the fifth metatarsal region where there is some necrotic tendon that is noted at this point. Unfortunately this is preventing good granulation and over this point the rest of the metatarsal region is completely covered over with granulation tissue which is great news there is some other slough and biofilm buildup that I will clearway as well. 07-27-2022 upon evaluation today patient appears to be doing well currently in regard to his plantar foot ulcer. He has been tolerating the dressing changes without complication  fortunately. I do believe he is making excellent progress and each time I see him this is better and better is pretty much completely covered as far as any bone exposure is concerned and he still is on the IV antibiotics were still also undergoing hyperbarics at this point that he just got started and is doing quite well with since getting the tubes in his ears.Marland Kitchen 08-03-2022 upon evaluation today patient appears to be doing well in general although has been having a lot of drainage compared to normal. It was actually leaking through his dressing today. He tells me he changed it last night. With that being said he has been up on his feet a lot more which I think is probably a big part of the issue here. Fortunately I do not see any signs of infection locally or systemically. 08-17-2022 upon evaluation today patient is doing well with regard to his wound this is actually measuring smaller which is good news. I am very pleased in that regard. With that being said he has not been participating  HBO therapy as he really needs to afford to see this heal appropriately and completely. In fact he has only made 9 total HBO treatments in the past 4 weeks. Again this is not therapeutic and I discussed that with the patient. Nonetheless he is still improving and since the hyperbarics is not helping based on the discussion today the patient is going to discontinue HBO therapy at this point. He tells me that getting here 5 days a week just is not possible. He has had difficulty getting here and then also when he was here had difficulty with getting here at the right time which has also been part of the problem. Nonetheless either way I am pleased that he is doing better and my hope is that we will still be able to get him healed obviously he should continue with the antibiotics as well as the wound care as he has been doing this seems to be doing excellent for him but everything is improving quite nicely. 08-24-2022 upon  evaluation today patient appears to be doing well currently in regard to his wound this is actually measuring significantly better even compared to last week. I am very pleased in that regard I do not see any signs of infection and I think he is making excellent progress here. 08-31-2022 upon evaluation today patient's wound is actually showing signs of significant improvement. I am actually very pleased with where things stand currently. There does not appear to be any evidence of active infection locally or systemically at this time. No fevers, chills, nausea, vomiting, or diarrhea. 09-19-2022 upon evaluation patient's wound is actually showing signs of improvement though it still having quite a bit of drainage. I do believe he would benefit from switching over to [facility]. I discussed that with him today. He is definitely in agreement with that plan. 09-28-2022 upon evaluation patient's wound is actually showing signs of improvement. With that being said he has been using the [facility] but he has not had enough to really cover the whole wound. In fact he tells me he just got his supplies today that we had ordered last week. Fortunately I do not see any evidence of active infection at this time. 10-05-2022 upon evaluation today patient appears to be doing well currently in regard to his foot wound there is actually much less debridement today to be done compared to previous. Fortunately I see no signs of active infection locally or systemically at this time. 11-02-2022 upon evaluation today patient appears to be doing well currently in regard to his wounds on the foot. Fortunately he seems to be making good progress is not quite as much of an improvement this week as it was last time I saw him but he had a lot going on and tells me that he has been up moving on [place], [place] ([id number]) [id number] Page 3 of 9 it more. I do believe he needs to try to get this under  control and I do believe staying off of it more will definitely help in that regard. 11-09-2022 upon evaluation today patient appears to be doing well currently in regard to his wound. He is showing signs of improvement which is great news. Fortunately I do not see any evidence of active infection locally nor systemically at this time. No fever or chills noted 11-16-2022 patient's wound still slowly seems to be making progress. The good news is we have gotten the approval for the Apligraf which is awesome. Working to see about  getting that started form hopefully will be able to get to him for the end of the year and then subsequently we can see if we can regain the approval for the final 3 in the first of the new year. Nonetheless I think getting this started as quickly as possible would be good for the patient currently. I do believe this is going to have a good result as far as helping with the healing and new tissue growth and epithelization in regard to the wound. He still using the offloading with a knee scooter at this point. 11-23-2022 upon evaluation today patient appears to be doing well currently in regard to his wound he does have a lot of drainage but fortunately no signs of infection at this point. Fortunately there does not appear to be any signs of active infection locally nor systemically at this time which is great news and overall I am extremely pleased with regard to where we stand. No fevers, chills, nausea, vomiting, or diarrhea. 12/28; patient arrived for Apligraf #2 today. The area is large and on the left plantar foot some irregular areas of hypergranulation however most of this looks clean. I think we are changing weekly because of drainage. 12-22-2022 upon evaluation today patient appears to be doing well currently in regard to his wound. He is not showing any signs of active infection which is great news. No fevers, chills, nausea, vomiting, or diarrhea. 01-09-2023 upon  evaluation today patient's foot actually appears to be doing well I have not seen him since 19 January but he has made some progress. The only downside is unfortunately his scooter has broken his hand order a piece to her. It is on the way. As soon as it arrives he will get back on that which will definitely help him significantly. 01-16-2023 upon evaluation today patient appears to be doing well currently in regard to his wound. Its about the same it does not look like there is any breakdown or worsening but it also does not look like it significantly smaller either. Fortunately I do not see any evidence of infection at this time. He does tell me that he got a new cable for his knee scooter but he has not gotten it fixed yet. He just got that today he tells me. He also of note has been up helping his brother to paint because "I was bored". Electronic Signature(s) Signed: 01/16/2023 4:12:28 PM By: Worthy Keeler PA-C Entered By: Worthy Keeler on 01/16/2023 16:12:28 -------------------------------------------------------------------------------- Physical Exam Details Patient Name: Date of Service: [name], [name] 01/16/2023 3:45 PM Medical Record Number: [id number] Patient Account Number: [id number] Date of Birth/Sex: Treating RN: [birth year] (57 y.o. [name] Primary Care Provider: Charlott Rakes Other Clinician: Referring Provider: Treating Provider/Extender: Sherene Sires Weeks in Treatment: 14 Constitutional Well-nourished and well-hydrated in no acute distress. Respiratory normal breathing without difficulty. Psychiatric this patient is able to make decisions and demonstrates good insight into disease process. Alert and Oriented x 3. pleasant and cooperative. Notes Upon inspection patient's wound bed actually showed signs of good granulation tissue. Fortunately there does not appear to be any significant slough and biofilm buildup. Overall I think the biggest issue  is just not staying off of this. Electronic Signature(s) Signed: 01/16/2023 4:12:43 PM By: Worthy Keeler PA-C Entered By: Worthy Keeler on 01/16/2023 16:12:43 Pickens, Truman Hayward ([id number]) [id number] Page 4 of 9 -------------------------------------------------------------------------------- Physician Orders Details Patient Name: Date of Service: [name], [name] 01/16/2023 3:45 PM  Medical Record Number: [id number] Patient Account Number: [id number] Date of Birth/Sex: Treating RN: 02-06-1966 (57 y.o. [name]) Carlene Coria Primary Care Provider: Charlott Rakes Other Clinician: Referring Provider: Treating Provider/Extender: Sherene Sires Weeks in Treatment: 30 Verbal / Phone Orders: No Diagnosis Coding ICD-10 Coding Code Description [phone] Other chronic osteomyelitis, left ankle and foot E11.621 Type 2 diabetes mellitus with foot ulcer L97.524 Non-pressure chronic ulcer of other part of left foot with necrosis of bone Follow-up Appointments Return Appointment in 1 week. Anesthetic (Use 'Patient Medications' Section for Anesthetic Order Entry) Lidocaine applied to wound bed Edema Control - Lymphedema / Segmental Compressive Device / Other Elevate, Exercise Daily and A void Standing for Long Periods of Time. Elevate legs to the level of the heart and pump ankles as often as possible Elevate leg(s) parallel to the floor when sitting. Off-Loading Other: - knee scooter Wound Treatment Wound #1 - Foot Wound Laterality: Plantar, Left Peri-Wound Care: AandD Ointment 3 x Per Week/30 Days Discharge Instructions: Apply AandD Ointment as directed Prim Dressing: Hydrofera Blue Ready Transfer Foam, 2.5x2.5 (in/in) 3 x Per Week/30 Days ary Discharge Instructions: Apply Hydrofera Blue Ready to wound bed as directed Secondary Dressing: ABD Pad 5x9 (in/in) (Generic) 3 x Per Week/30 Days Discharge Instructions: Cover with ABD pad Secondary Dressing: Kerlix 4.5 x  4.1 (in/yd) (Generic) 3 x Per Week/30 Days Discharge Instructions: Apply Kerlix 4.5 x 4.1 (in/yd) as instructed Secured With: Medipore T - 45M Medipore H Soft Cloth Surgical T ape ape, 2x2 (in/yd) (Generic) 3 x Per Week/30 Days Electronic Signature(s) Signed: 01/16/2023 4:20:55 PM By: Carlene Coria RN Signed: 01/16/2023 5:36:51 PM By: Worthy Keeler PA-C Entered By: Carlene Coria on 01/16/2023 16:19:30 [name] ([id number]) [id number] Page 5 of 9 -------------------------------------------------------------------------------- Problem List Details Patient Name: Date of Service: [name], [name] 01/16/2023 3:45 PM Medical Record Number: [id number] Patient Account Number: [id number] Date of Birth/Sex: Treating RN: Apr 09, 1966 (57 y.o. [name]) Carlene Coria Primary Care Provider: Charlott Rakes Other Clinician: Referring Provider: Treating Provider/Extender: Sherene Sires Weeks in Treatment: 30 Active Problems ICD-10 Encounter Code Description Active Date MDM Diagnosis [phone] Other chronic osteomyelitis, left ankle and foot 06/15/2022 No Yes E11.621 Type 2 diabetes mellitus with foot ulcer 06/15/2022 No Yes L97.524 Non-pressure chronic ulcer of other part of left foot with necrosis of bone 06/15/2022 No Yes Inactive Problems Resolved Problems Electronic Signature(s) Signed: 01/16/2023 3:51:57 PM By: Worthy Keeler PA-C Entered By: Worthy Keeler on 01/16/2023 15:51:56 -------------------------------------------------------------------------------- Progress Note Details Patient Name: Date of Service: [name], [name] 01/16/2023 3:45 PM Medical Record Number: [id number] Patient Account Number: [id number] Date of Birth/Sex: Treating RN: 1966/04/16 (57 y.o. 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 Glenford Peers care:   ADMISSION6/15/2023This is a 2 year old poorly controlled type II diabetic (last A1c 11.5%). In January of this year, he presented to the hospital with an abscess in his left foot. He also had a foreign body present. It was removed in the ER and he was admitted to the hospital for IV antibiotics. He was subsequently discharged on oral antibiotics. Due to financial concerns, he has not taken his diabetic medications (metformin and 70/30 insulin) and as a result, presented  back to the emergency room on June 1 with worsening findings, including frank pus and osteomyelitis on MRI. BKA was recommended, but he declined. He was taken to the operating room by Dr. Sharol Given who performed an extensive debridement. Cultures were taken and he was placed on IV antibiotics. He saw infectious disease while in the hospital who prescribed a 6-week course of IV antibiotics including cefazolin as well as oral metronidazole and levofloxacin. The infectious disease provider also indicated that he would benefit from below-knee amputation, but the patient desired another opinion and therefore he has been referred to wound care center for further evaluation and management. ABIs obtained while he was in the hospital demonstrate adequate blood flow. Essentially the entire bottom of the patient's left foot has been removed. The heads of the majority of his metatarsal bones are exposed along with their accompanying tendons. Muscle and fat are exposed as well. The wound surface is fairly dry; the patient reports that he was not given any specific wound care instructions other than to place a dry dressing on it after showering. There is no odor  or purulent drainage identified. 05/26/2022: The wound measures a little bit smaller today. There is some good granulation tissue beginning to form on the surface. He still has exposed bone and tendon. There is some slough accumulation. He saw infectious disease earlier this week and was frustrated that they recommended amputation. He is interested in another surgical opinion. He continues to smoke but says that he has cut down. He is using a knee scooter for mobility so that he can stay off of his foot. 06/05/2022: He saw Dr. Sharol Given last week who was supportive of our efforts to try to salvage the foot. I referred him to vascular surgery to see Dr. Carlis Abbott but he has not received an appointment yet. He continues on IV antibiotics. He says that after our conversation last week about him being unable to initiate hyperbarics if he was going to continue to smoke, he has not had a cigarette since. He is now at 4 weeks of conventional management and his insurance was initiated on July 1. Today, the wound continues to show evidence of improvement. There is good granulation tissue forming. The metatarsals are still exposed but actually have some pink periosteum. There is still some slough and fibrinous exudate present. No odor or purulent drainage. We have just been using Dakin's dressing changes for now. Admission to [place] at [facility]: 06-15-2022 upon evaluation today patient presents for initial inspection here in our clinic though he has been seen by Dr. Celine Ahr in [place] up to this point. I did review her notes and records as well they have been using Dakin's moistened gauze dressing which has done a great job at helping to clean out the bottom of his foot. Please see the discourse above for additional information in regard to treatment course up until the patient arrives here in the [place] clinic today. The patient is ready to get started with HBO therapy as soon as possible I do think based on  all my review of his records this seems to be appropriate he does have chronic osteomyelitis is also been offered amputation this is obviously a limb salvage operation here. We are trying to prevent him from having to undergo an extensive amputation which would likely be a below-knee amputation which in turn is going to affect his quality of life he wants to try to avoid that if at all possible I think hyperbaric  oxygen therapy is his best bet to achieve that goal. Currently he has been on IV Ancef. That actually ends tomorrow. Following that according to notes it appears he supposed to be taking Levaquin and Flagyl for an additional period of time although I am not certain exactly how long that with the. He has been seeing regional physicians infectious disease in [place] and again that so I recommended that he contact today in order to find out what the plan is going forward. His most recent hemoglobin A1c as noted above was 11.5 on 05-07-2022 he tells me trying to work on that he is also quit smoking as of this point. 06-22-2022 upon evaluation today patient appears to be doing well currently in regard to his wound all things considered. He is showing signs of a lot of new granulation tissue and overall very pleased. He is changing this once a day with the Dakin's moistened gauze dressing. Fortunately I do not see any evidence of infection locally or systemically at this time. We have been trying to get him into the hyperbaric chamber he was having a lot of issues with sinus pressure and we have made referral and called multiple ENT specialist the earliest we have been able to get his August 9 at [facility] ear nose throat. They are going to try to work him in sooner if they have any opportunities to do so. Obviously I think the sooner he can do this the better. We need to get him in hyperbarics as quickly as possible. 7/26; patient presents for follow-up. He has been using Dakin's wet-to-dry  dressings and oil emollient dressing over the exposed bone. He currently denies systemic signs of infection. He states he is scheduled to see infectious disease, Dr Candiss Norse next week. She had ordered an MRI of the left foot and he is scheduled to have this done tomorrow. He currently denies systemic signs of infection Including fever/chills, nausea/vomiting increased warmth or erythema to the wound bed or purulent drainage. He started hyperbaric oxygen treatment however did not tolerate this due to sinus pressure. He is scheduled to see ENT in 2 weeks for evaluation. He is not continuing HBO until he is cleared by ENT. 07-06-2022 patient presents today for follow-up concerning his plantar foot ulcer. The good news is this actually is significantly better compared to previous. Some of the bone which is necrotic is starting to slough off and I am going to perform debridement to clear this away today. Nonetheless I do think that the patient is going to need hyperbarics and we need to try to get it as soon as possible. He still having a lot of sinus pressure I can even hear the congestion today. He is on 3 different antibiotics cefadroxil, metronidazole, and Levaquin currently for the osteomyelitis. For that reason this should actually help with the infection if he does have a chronic sinusitis I am also going to see about getting him on steroids to try to see if this can benefit him as well. He voiced understanding and he is in agreement with giving that shot. This is probably can to spike his blood sugars which I discussed with him he is going to be seeing his primary care provider later today I want him to let her know as well what is going on and why so that she could be aware but I really do think he needs to take the prednisone. We need to get him in the chamber as soon as possible to  try to help with getting this wound to heal and closed so that he does not lose his foot. 07-13-2022 upon evaluation today  patient appears to be doing better in regard to his wound he continues to show signs of new granulation am going to perform some debridement today but overall this is doing quite well. 07-20-2022 upon evaluation today patient's wound is actually showing some signs of improvement he does have 1 area on the fifth metatarsal region where there is some necrotic tendon that is noted at this point. Unfortunately this is preventing good granulation and over this point the rest of the metatarsal region is completely covered over with granulation tissue which is great news there is some other slough and biofilm buildup that I will clearway as well. 07-27-2022 upon evaluation today patient appears to be doing well currently in regard to his plantar foot ulcer. He has been tolerating the dressing changes without complication fortunately. I do believe he is making excellent progress and each time I see him this is better and better is pretty much completely covered as far as any bone exposure is concerned and he still is on the IV antibiotics were still also undergoing hyperbarics at this point that he just got started and is doing quite well with since getting the tubes in his ears.Marland Kitchen 08-03-2022 upon evaluation today patient appears to be doing well in general although has been having a lot of drainage compared to normal. It was actually leaking through his dressing today. He tells me he changed it last night. With that being said he has been up on his feet a lot more which I think is probably a big part of the issue here. Fortunately I do not see any signs of infection locally or systemically. 08-17-2022 upon evaluation today patient is doing well with regard to his wound this is actually measuring smaller which is good news. I am very pleased in that regard. With that being said he has not been participating HBO therapy as he really needs to afford to see this heal appropriately and completely. In fact he has only  made 9 total HBO treatments in the past 4 weeks. Again this is not therapeutic and I discussed that with the patient. Nonetheless he is still improving and since the hyperbarics is not helping based on the discussion today the patient is going to discontinue HBO therapy at this point. He tells me that getting here 5 days a week just is not possible. He has had difficulty getting here and then also when he was here had difficulty with getting here at the right time which has also been part of the problem. Nonetheless either way I am pleased that he is doing better and my hope is that we will still be able to get him healed [name], [name] ([id number]) [id number] Page 7 of 9 obviously he should continue with the antibiotics as well as the wound care as he has been doing this seems to be doing excellent for him but everything is improving quite nicely. 08-24-2022 upon evaluation today patient appears to be doing well currently in regard to his wound this is actually measuring significantly better even compared to last week. I am very pleased in that regard I do not see any signs of infection and I think he is making excellent progress here. 08-31-2022 upon evaluation today patient's wound is actually showing signs of significant improvement. I am actually very pleased with where things stand currently. There does not appear  to be any evidence of active infection locally or systemically at this time. No fevers, chills, nausea, vomiting, or diarrhea. 09-19-2022 upon evaluation patient's wound is actually showing signs of improvement though it still having quite a bit of drainage. I do believe he would benefit from switching over to [facility]. I discussed that with him today. He is definitely in agreement with that plan. 09-28-2022 upon evaluation patient's wound is actually showing signs of improvement. With that being said he has been using the [facility] but he has not had  enough to really cover the whole wound. In fact he tells me he just got his supplies today that we had ordered last week. Fortunately I do not see any evidence of active infection at this time. 10-05-2022 upon evaluation today patient appears to be doing well currently in regard to his foot wound there is actually much less debridement today to be done compared to previous. Fortunately I see no signs of active infection locally or systemically at this time. 11-02-2022 upon evaluation today patient appears to be doing well currently in regard to his wounds on the foot. Fortunately he seems to be making good progress is not quite as much of an improvement this week as it was last time I saw him but he had a lot going on and tells me that he has been up moving on it more. I do believe he needs to try to get this under control and I do believe staying off of it more will definitely help in that regard. 11-09-2022 upon evaluation today patient appears to be doing well currently in regard to his wound. He is showing signs of improvement which is great news. Fortunately I do not see any evidence of active infection locally nor systemically at this time. No fever or chills noted 11-16-2022 patient's wound still slowly seems to be making progress. The good news is we have gotten the approval for the Apligraf which is awesome. Working to see about getting that started form hopefully will be able to get to him for the end of the year and then subsequently we can see if we can regain the approval for the final 3 in the first of the new year. Nonetheless I think getting this started as quickly as possible would be good for the patient currently. I do believe this is going to have a good result as far as helping with the healing and new tissue growth and epithelization in regard to the wound. He still using the offloading with a knee scooter at this point. 11-23-2022 upon evaluation today patient appears to be doing  well currently in regard to his wound he does have a lot of drainage but fortunately no signs of infection at this point. Fortunately there does not appear to be any signs of active infection locally nor systemically at this time which is great news and overall I am extremely pleased with regard to where we stand. No fevers, chills, nausea, vomiting, or diarrhea. 12/28; patient arrived for Apligraf #2 today. The area is large and on the left plantar foot some irregular areas of hypergranulation however most of this looks clean. I think we are changing weekly because of drainage. 12-22-2022 upon evaluation today patient appears to be doing well currently in regard to his wound. He is not showing any signs of active infection which is great news. No fevers, chills, nausea, vomiting, or diarrhea. 01-09-2023 upon evaluation today patient's foot actually appears to be doing well  I have not seen him since 19 January but he has made some progress. The only downside is unfortunately his scooter has broken his hand order a piece to her. It is on the way. As soon as it arrives he will get back on that which will definitely help him significantly. 01-16-2023 upon evaluation today patient appears to be doing well currently in regard to his wound. Its about the same it does not look like there is any breakdown or worsening but it also does not look like it significantly smaller either. Fortunately I do not see any evidence of infection at this time. He does tell me that he got a new cable for his knee scooter but he has not gotten it fixed yet. He just got that today he tells me. He also of note has been up helping his brother to paint because "I was bored". Objective Constitutional Well-nourished and well-hydrated in no acute distress. Vitals Time Taken: 3:54 PM, Height: 70 in, Weight: 230 lbs, BMI: 33, Temperature: 98 F, Pulse: 84 bpm, Respiratory Rate: 18 breaths/min, Blood Pressure: 156/92  mmHg. Respiratory normal breathing without difficulty. Psychiatric this patient is able to make decisions and demonstrates good insight into disease process. Alert and Oriented x 3. pleasant and cooperative. General Notes: Upon inspection patient's wound bed actually showed signs of good granulation tissue. Fortunately there does not appear to be any significant slough and biofilm buildup. Overall I think the biggest issue is just not staying off of this. Integumentary (Hair, Skin) Wound #1 status is Open. Original cause of wound was Gradually Appeared. The date acquired was: 12/04/2021. The wound has been in treatment 30 weeks. The wound is located on the [place]. The wound measures 5.3cm length x 6cm width x 0.2cm depth; 24.976cm^2 area and 4.995cm^3 volume. There is Fat Layer (Subcutaneous Tissue) exposed. There is no tunneling or undermining noted. There is a medium amount of serosanguineous drainage noted. There is medium (34-66%) pink, hyper - granulation within the wound bed. There is a medium (34-66%) amount of necrotic tissue within the wound bed including Adherent Slough. 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 Anesthetic (Use 'Patient Medications' Section for Anesthetic Order Entry): Lidocaine applied to wound bed Edema Control - Lymphedema / Segmental Compressive Device / Other: Elevate, Exercise Daily and Avoid Standing for Long Periods of Time. Elevate legs to the level of the heart and pump ankles as often as possible Elevate leg(s) parallel to the floor when sitting. WOUND #1: - Foot Wound Laterality: Plantar, Left Peri-Wound Care: AandD Ointment 3 x Per Week/30  Days Discharge Instructions: Apply AandD Ointment as directed Prim Dressing: Gauze (Generic) 3 x Per Week/30 Days ary Discharge Instructions: As directed: dry, moistened with saline or moistened with Dakins Solution Prim Dressing: Hydrofera Blue Ready Transfer Foam, 2.5x2.5 (in/in) 3 x Per Week/30 Days ary Discharge Instructions: Apply Hydrofera Blue Ready to wound bed as directed Secondary Dressing: ABD Pad 5x9 (in/in) (Generic) 3 x Per Week/30 Days Discharge Instructions: Cover with ABD pad Secondary Dressing: Kerlix 4.5 x 4.1 (in/yd) (Generic) 3 x Per Week/30 Days Discharge Instructions: Apply Kerlix 4.5 x 4.1 (in/yd) as instructed Secured With: Medipore T - 42M Medipore H Soft Cloth Surgical T ape ape, 2x2 (in/yd) (Generic) 3 x Per  Week/30 Days 1. I would recommend currently that we have the patient continue to monitor of infection or worsening. Based on what I see I do believe that he is making some good progress for any signs and was making better progress when he had his knee scooter. 2. We will continue with the [facility] followed by ABD pad and roll gauze to secure in place. We will see patient back for reevaluation in 1 week here in the clinic. If anything worsens or changes patient will contact our office for additional recommendations. Electronic Signature(s) Signed: 01/16/2023 4:14:45 PM By: Worthy Keeler PA-C Entered By: Worthy Keeler on 01/16/2023 16:14:45 -------------------------------------------------------------------------------- SuperBill Details Patient Name: Date of Service: [name], [name] 01/16/2023 Medical Record Number: [id number] Patient Account Number: [id number] Date of Birth/Sex: Treating RN: January 18, 1966 (57 y.o. [name]) Carlene Coria Primary Care Provider: Charlott Rakes Other Clinician: Referring Provider: Treating Provider/Extender: Sherene Sires Weeks in Treatment: 30 Diagnosis Coding ICD-10 Codes Code Description [phone] Other chronic  osteomyelitis, left ankle and foot E11.621 Type 2 diabetes mellitus with foot ulcer L97.524 Non-pressure chronic ulcer of other part of left foot with necrosis of bone SEIDNER, Weylyn ([id number]) [id number] Page 9 of 9 Facility Procedures : CPT4 Code: [id number] Description: [phone] - WOUND CARE VISIT-LEV 2 EST PT Modifier: Quantity: 1 Physician Procedures : CPT4 Code Description Modifier [id number] - WC PHYS LEVEL 3 - EST PT ICD-10 Diagnosis Description M86.672 Other chronic osteomyelitis, left ankle and foot E11.621 Type 2 diabetes mellitus with foot ulcer L97.524 Non-pressure chronic ulcer of other  part of left foot with necrosis of bone Quantity: 1 Electronic Signature(s) Signed: 01/16/2023 4:15:15 PM By: Worthy Keeler PA-C Entered By: Worthy Keeler on 01/16/2023 16:15:15

## 2023-01-22 DIAGNOSIS — E1165 Type 2 diabetes mellitus with hyperglycemia: Secondary | ICD-10-CM | POA: Diagnosis not present

## 2023-01-22 DIAGNOSIS — E785 Hyperlipidemia, unspecified: Secondary | ICD-10-CM | POA: Diagnosis not present

## 2023-01-22 DIAGNOSIS — Z125 Encounter for screening for malignant neoplasm of prostate: Secondary | ICD-10-CM | POA: Diagnosis not present

## 2023-01-22 DIAGNOSIS — M79672 Pain in left foot: Secondary | ICD-10-CM | POA: Diagnosis not present

## 2023-01-23 ENCOUNTER — Encounter: Payer: Medicaid Other | Admitting: Physician Assistant

## 2023-01-23 DIAGNOSIS — E1151 Type 2 diabetes mellitus with diabetic peripheral angiopathy without gangrene: Secondary | ICD-10-CM | POA: Diagnosis not present

## 2023-01-23 DIAGNOSIS — E11621 Type 2 diabetes mellitus with foot ulcer: Secondary | ICD-10-CM | POA: Diagnosis not present

## 2023-01-23 DIAGNOSIS — L02612 Cutaneous abscess of left foot: Secondary | ICD-10-CM | POA: Diagnosis not present

## 2023-01-23 DIAGNOSIS — F17200 Nicotine dependence, unspecified, uncomplicated: Secondary | ICD-10-CM | POA: Diagnosis not present

## 2023-01-23 DIAGNOSIS — L97522 Non-pressure chronic ulcer of other part of left foot with fat layer exposed: Secondary | ICD-10-CM | POA: Diagnosis not present

## 2023-01-23 DIAGNOSIS — E1165 Type 2 diabetes mellitus with hyperglycemia: Secondary | ICD-10-CM | POA: Diagnosis not present

## 2023-01-23 DIAGNOSIS — M86672 Other chronic osteomyelitis, left ankle and foot: Secondary | ICD-10-CM | POA: Diagnosis not present

## 2023-01-23 DIAGNOSIS — L97524 Non-pressure chronic ulcer of other part of left foot with necrosis of bone: Secondary | ICD-10-CM | POA: Diagnosis not present

## 2023-01-24 NOTE — Progress Notes (Addendum)
RONSON, DUCHESNEAU ([id number]) [id number] Page 1 of 10 Visit Report for 01/23/2023 Chief Complaint Document Details Patient Name: Date of Service: [name], [name] 01/23/2023 3:30 PM Medical Record Number: [id number] Patient Account Number: [id number] Date of Birth/Sex: Treating RN: [birth year] (57 y.o. 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 [name] Primary Care Provider: Charlott [name] Other Clinician: Massie [name] Referring Provider: Treating Provider/Extender: [name] Weeks in Treatment: 31 Debridement Performed for Assessment: Wound #1 Left,Plantar Foot Performed By: Physician [name]., PA-C Debridement Type: Debridement Severity of Tissue Pre Debridement: Fat layer exposed Level of Consciousness (Pre-procedure): Awake and Alert Pre-procedure Verification/Time Out Yes - 16:03 Taken: Start Time: 16:03 T Area Debrided (L x W): otal 5.5 (cm) x 6 (cm) = 33 (cm) Tissue and other material debrided: Viable, Non-Viable, Callus, Slough, Subcutaneous, Slough Level: Skin/Subcutaneous Tissue Debridement Description: Excisional Instrument: Curette Bleeding: Minimum Hemostasis Achieved: Pressure Response to Treatment: Procedure was tolerated well Level of  Consciousness (Post- Awake and Alert procedure): [name], Matthew ([id number]) [id number].pdf Page 2 of 10 Post Debridement Measurements of Total Wound Length: (cm) 5 Width: (cm) 5.4 Depth: (cm) 0.2 Volume: (cm) 4.241 Character of Wound/Ulcer Post Debridement: Stable Severity of Tissue Post Debridement: Fat layer exposed Post Procedure Diagnosis Same as Pre-procedure Electronic Signature(s) Signed: 01/23/2023 5:46:15 PM By: Worthy Keeler PA-C Signed: 01/25/2023 11:31:26 AM By: [name] BSN, RN, CWS, Kim RN, BSN Signed: 01/29/2023 9:50:02 AM By: [name] Entered By: [name] on 01/23/2023 16:07:48 -------------------------------------------------------------------------------- HPI Details Patient Name: Date of Service: [name], [name] 01/23/2023 3:30 PM Medical Record Number: [id number] Patient Account Number: [id number] Date of Birth/Sex: Treating RN: 1966-03-06 (57 y.o. [name] Primary Care Provider: Charlott [name] Other Clinician: Massie [name] Referring Provider: Treating Provider/Extender: [name] Weeks in Treatment: 79 History of Present Illness HPI Description: [facility] Glenford [name] care:   ADMISSION6/15/2023This is a 57 year old poorly controlled type II diabetic (last A1c 11.5%). In January of this year, he presented to the hospital with an abscess in his left foot. He also had a foreign body present. It was removed in the ER and he was admitted to the hospital for IV antibiotics. He was subsequently discharged on oral antibiotics. Due to financial concerns, he has not taken his diabetic medications (metformin and 70/30 insulin) and as a result, presented back to the emergency room on June 1 with worsening findings, including frank pus and osteomyelitis on MRI. BKA was recommended, but he declined. He was taken to the operating room by [name] who performed an extensive debridement.  Cultures were taken and he was placed on IV antibiotics. He saw infectious disease while in the hospital who prescribed a 6-week course of IV antibiotics including cefazolin as well as oral metronidazole and levofloxacin. The infectious disease provider also indicated that he would benefit from below-knee amputation, but the patient desired another opinion and therefore he has been referred to wound care center for further evaluation and management. ABIs obtained while  he was in the hospital demonstrate adequate blood flow. Essentially the entire bottom of the patient's left foot has been removed. The heads of the majority of his metatarsal bones are exposed along with their accompanying tendons. Muscle and fat are exposed as well. The wound surface is fairly dry; the patient reports that he was not [name] any specific wound care instructions other than to place a dry dressing on it after showering. There is no odor or purulent drainage identified. 05/26/2022: The wound measures a little bit smaller today. There is some good granulation tissue beginning to form on the surface. He still has exposed bone and tendon. There is some slough accumulation. He saw infectious disease earlier this week and was frustrated that they recommended amputation. He is interested in another surgical opinion. He continues to smoke but says that he has cut down. He is using a knee scooter for mobility so that he can stay off of his foot. 06/05/2022: He saw [name] last week who was supportive of our efforts to try to salvage the foot. I referred him to vascular surgery to see [name] but he has not received an appointment yet. He continues on IV antibiotics. He says that after our conversation last week about him being unable to initiate hyperbarics if he was going to continue to smoke, he has not had a cigarette since. He is now at 4 weeks of conventional management and his insurance was initiated on July 1. Today, the  wound continues to show evidence of improvement. There is good granulation tissue forming. The metatarsals are still exposed but actually have some pink periosteum. There is still some slough and fibrinous exudate present. No odor or purulent drainage. We have just been using Dakin's dressing changes for now. Admission to [place] [facility]: 06-15-2022 upon evaluation today patient presents for initial inspection here in our clinic though he has been seen by Dr. Celine Ahr in [place] up to this point. I did review her notes and records as well they have been using Dakin's moistened gauze dressing which has done a great job at helping to clean out the bottom of his foot. Please see the discourse above for additional information in regard to treatment course up until the patient arrives here in the [facility] clinic today. The patient is ready to get started with HBO therapy as soon as possible I do think based on all my review of his records this seems to be appropriate he does have chronic osteomyelitis is also been offered amputation this is obviously a limb salvage operation here. We are trying to prevent him from having to undergo an extensive amputation which would likely be a below-knee amputation which in turn is going to affect his quality of life he wants to try to avoid that if at all possible I think hyperbaric oxygen therapy is his best bet to achieve that goal. Currently he has been on IV Ancef. That actually ends [place], [place] ([id number]) [id number] Page 3 of 10 tomorrow. Following that according to notes it appears he supposed to be taking Levaquin and Flagyl for an additional period of time although I am not certain exactly how long that with the. He has been seeing regional physicians infectious disease in [place] and again that so I recommended that he contact today in order to find out what the plan is going forward. His most recent hemoglobin A1c as  noted above was 11.5 on 05-07-2022 he tells me trying to work on  that he is also quit smoking as of this point. 06-22-2022 upon evaluation today patient appears to be doing well currently in regard to his wound all things considered. He is showing signs of a lot of new granulation tissue and overall very pleased. He is changing this once a day with the Dakin's moistened gauze dressing. Fortunately I do not see any evidence of infection locally or systemically at this time. We have been trying to get him into the hyperbaric chamber he was having a lot of issues with sinus pressure and we have made referral and called multiple ENT specialist the earliest we have been able to get his August 9 at [facility] ear nose throat. They are going to try to work him in sooner if they have any opportunities to do so. Obviously I think the sooner he can do this the better. We need to get him in hyperbarics as quickly as possible. 7/26; patient presents for follow-up. He has been using Dakin's wet-to-dry dressings and oil emollient dressing over the exposed bone. He currently denies systemic signs of infection. He states he is scheduled to see infectious disease, Dr Candiss Norse next week. She had ordered an MRI of the left foot and he is scheduled to have this done tomorrow. He currently denies systemic signs of infection Including fever/chills, nausea/vomiting increased warmth or erythema to the wound bed or purulent drainage. He started hyperbaric oxygen treatment however did not tolerate this due to sinus pressure. He is scheduled to see ENT in 2 weeks for evaluation. He is not continuing HBO until he is cleared by ENT. 07-06-2022 patient presents today for follow-up concerning his plantar foot ulcer. The good news is this actually is significantly better compared to previous. Some of the bone which is necrotic is starting to slough off and I am going to perform debridement to clear this away today. Nonetheless I do think that  the patient is going to need hyperbarics and we need to try to get it as soon as possible. He still having a lot of sinus pressure I can even hear the congestion today. He is on 3 different antibiotics cefadroxil, metronidazole, and Levaquin currently for the osteomyelitis. For that reason this should actually help with the infection if he does have a chronic sinusitis I am also going to see about getting him on steroids to try to see if this can benefit him as well. He voiced understanding and he is in agreement with giving that shot. This is probably can to spike his blood sugars which I discussed with him he is going to be seeing his primary care provider later today I want him to let her know as well what is going on and why so that she could be aware but I really do think he needs to take the prednisone. We need to get him in the chamber as soon as possible to try to help with getting this wound to heal and closed so that he does not lose his foot. 07-13-2022 upon evaluation today patient appears to be doing better in regard to his wound he continues to show signs of new granulation am going to perform some debridement today but overall this is doing quite well. 07-20-2022 upon evaluation today patient's wound is actually showing some signs of improvement he does have 1 area on the fifth metatarsal region where there is some necrotic tendon that is noted at this point. Unfortunately this is preventing good granulation and over this point the rest  of the metatarsal region is completely covered over with granulation tissue which is great news there is some other slough and biofilm buildup that I will clearway as well. 07-27-2022 upon evaluation today patient appears to be doing well currently in regard to his plantar foot ulcer. He has been tolerating the dressing changes without complication fortunately. I do believe he is making excellent progress and each time I see him this is better and better is  pretty much completely covered as far as any bone exposure is concerned and he still is on the IV antibiotics were still also undergoing hyperbarics at this point that he just got started and is doing quite well with since getting the tubes in his ears.Marland Kitchen 08-03-2022 upon evaluation today patient appears to be doing well in general although has been having a lot of drainage compared to normal. It was actually leaking through his dressing today. He tells me he changed it last night. With that being said he has been up on his feet a lot more which I think is probably a big part of the issue here. Fortunately I do not see any signs of infection locally or systemically. 08-17-2022 upon evaluation today patient is doing well with regard to his wound this is actually measuring smaller which is good news. I am very pleased in that regard. With that being said he has not been participating HBO therapy as he really needs to afford to see this heal appropriately and completely. In fact he has only made 9 total HBO treatments in the past 4 weeks. Again this is not therapeutic and I discussed that with the patient. Nonetheless he is still improving and since the hyperbarics is not helping based on the discussion today the patient is going to discontinue HBO therapy at this point. He tells me that getting here 5 days a week just is not possible. He has had difficulty getting here and then also when he was here had difficulty with getting here at the right time which has also been part of the problem. Nonetheless either way I am pleased that he is doing better and my hope is that we will still be able to get him healed obviously he should continue with the antibiotics as well as the wound care as he has been doing this seems to be doing excellent for him but everything is improving quite nicely. 08-24-2022 upon evaluation today patient appears to be doing well currently in regard to his wound this is actually measuring  significantly better even compared to last week. I am very pleased in that regard I do not see any signs of infection and I think he is making excellent progress here. 08-31-2022 upon evaluation today patient's wound is actually showing signs of significant improvement. I am actually very pleased with where things stand currently. There does not appear to be any evidence of active infection locally or systemically at this time. No fevers, chills, nausea, vomiting, or diarrhea. 09-19-2022 upon evaluation patient's wound is actually showing signs of improvement though it still having quite a bit of drainage. I do believe he would benefit from switching over to [facility]. I discussed that with him today. He is definitely in agreement with that plan. 09-28-2022 upon evaluation patient's wound is actually showing signs of improvement. With that being said he has been using the [facility] but he has not had enough to really cover the whole wound. In fact he tells me he just got his supplies today  that we had ordered last week. Fortunately I do not see any evidence of active infection at this time. 10-05-2022 upon evaluation today patient appears to be doing well currently in regard to his foot wound there is actually much less debridement today to be done compared to previous. Fortunately I see no signs of active infection locally or systemically at this time. 11-02-2022 upon evaluation today patient appears to be doing well currently in regard to his wounds on the foot. Fortunately he seems to be making good progress is not quite as much of an improvement this week as it was last time I saw him but he had a lot going on and tells me that he has been up moving on it more. I do believe he needs to try to get this under control and I do believe staying off of it more will definitely help in that regard. 11-09-2022 upon evaluation today patient appears to be doing well currently in regard to his wound. He  is showing signs of improvement which is great news. Fortunately I do not see any evidence of active infection locally nor systemically at this time. No fever or chills noted 11-16-2022 patient's wound still slowly seems to be making progress. The good news is we have gotten the approval for the Apligraf which is awesome. Working to see about getting that started form hopefully will be able to get to him for the end of the year and then subsequently we can see if we can regain the approval for the final 3 in the first of the new year. Nonetheless I think getting this started as quickly as possible would be good for the patient currently. I do believe this is going to have a good result as far as helping with the healing and new tissue growth and epithelization in regard to the wound. He still using the offloading with a knee scooter at this point. 11-23-2022 upon evaluation today patient appears to be doing well currently in regard to his wound he does have a lot of drainage but fortunately no signs of infection at this point. Fortunately there does not appear to be any signs of active infection locally nor systemically at this time which is great news and overall I am extremely pleased with regard to where we stand. No fevers, chills, nausea, vomiting, or diarrhea. 12/28; patient arrived for Apligraf #2 today. The area is large and on the left plantar foot some irregular areas of hypergranulation however most of this looks clean. I think we are changing weekly because of drainage. 12-22-2022 upon evaluation today patient appears to be doing well currently in regard to his wound. He is not showing any signs of active infection which is great news. No fevers, chills, nausea, vomiting, or diarrhea. [name], [name] ([id number]) [id number] Page 4 of 10 01-09-2023 upon evaluation today patient's foot actually appears to be doing well I have not seen him since 19 January but he has made  some progress. The only downside is unfortunately his scooter has broken his hand order a piece to her. It is on the way. As soon as it arrives he will get back on that which will definitely help him significantly. 01-16-2023 upon evaluation today patient appears to be doing well currently in regard to his wound. Its about the same it does not look like there is any breakdown or worsening but it also does not look like it significantly smaller either. Fortunately I do not see any evidence of infection  at this time. He does tell me that he got a new cable for his knee scooter but he has not gotten it fixed yet. He just got that today he tells me. He also of note has been up helping his brother to paint because "I was bored". 01-23-2023 upon evaluation today patient's wound is actually showing signs of improvement already which is great news. He just got a scooter going last week after I saw him and he has done much better with the offloading as soon as he got the knee scooter back in action. In general I am very pleased with where we stand currently. I do not see any signs of active infection locally nor systemically which is great news. Electronic Signature(s) Signed: 01/23/2023 5:07:42 PM By: Worthy Keeler PA-C Entered By: Worthy [name] on 01/23/2023 17:07:42 -------------------------------------------------------------------------------- Physical Exam Details Patient Name: Date of Service: [name], [name] 01/23/2023 3:30 PM Medical Record Number: [id number] Patient Account Number: [id number] Date of Birth/Sex: Treating RN: [birth year] (57 y.o. [name] Primary Care Provider: Charlott [name] Other Clinician: Massie [name] Referring Provider: Treating Provider/Extender: [name] Weeks in Treatment: 88 Constitutional Well-nourished and well-hydrated in no acute distress. Respiratory normal breathing without difficulty. Psychiatric this patient is able to make  decisions and demonstrates good insight into disease process. Alert and Oriented x 3. pleasant and cooperative. Notes I am going to recommend that we have the patient continue to monitor for any signs of infection or worsening. In general I think that we are really doing quite well here. I am going to suggest as well we performed debridement I did clearway the necrotic debris on the surface of the wound including slough and biofilm down to good subcutaneous tissue as well as the callus around the edges of the wound he tolerated all the above without complication. Electronic Signature(s) Signed: 01/23/2023 5:08:17 PM By: Worthy Keeler PA-C Entered By: Worthy [name] on 01/23/2023 17:08:16 -------------------------------------------------------------------------------- Physician Orders Details Patient Name: Date of Service: [name], [name] 01/23/2023 3:30 PM Medical Record Number: [id number] Patient Account Number: [id number] Date of Birth/Sex: Treating RN: 10/27/1966 (57 y.o. [name] [place], [place] ([id number]) [phone].pdf Page 5 of 10 Primary Care Provider: Charlott [name] Other Clinician: Massie [name] Referring Provider: Treating Provider/Extender: [name] Weeks in Treatment: 29 Verbal / Phone Orders: No Diagnosis Coding ICD-10 Coding Code Description [phone] Other chronic osteomyelitis, left ankle and foot E11.621 Type 2 diabetes mellitus with foot ulcer L97.524 Non-pressure chronic ulcer of other part of left foot with necrosis of bone Follow-up Appointments Return Appointment in 1 week. Anesthetic (Use 'Patient Medications' Section for Anesthetic Order Entry) Lidocaine applied to wound bed Edema Control - Lymphedema / Segmental Compressive Device / Other Elevate, Exercise Daily and A void Standing for Long Periods of Time. Elevate legs to the level of the heart and pump ankles as often as possible Elevate leg(s) parallel to the  floor when sitting. Off-Loading Other: - knee scooter Wound Treatment Wound #1 - Foot Wound Laterality: Plantar, Left Peri-Wound Care: AandD Ointment 3 x Per Week/30 Days Discharge Instructions: Apply AandD Ointment as directed Prim Dressing: Hydrofera Blue Ready Transfer Foam, 2.5x2.5 (in/in) 3 x Per Week/30 Days ary Discharge Instructions: Apply Hydrofera Blue Ready to wound bed as directed Secondary Dressing: ABD Pad 5x9 (in/in) (Generic) 3 x Per Week/30 Days Discharge Instructions: Cover with ABD pad Secondary Dressing: Kerlix 4.5 x 4.1 (in/yd) (Generic) 3 x Per Week/30 Days Discharge Instructions: Apply Kerlix  4.5 x 4.1 (in/yd) as instructed Secured With: Medipore T - 59M Medipore H Soft Cloth Surgical T ape ape, 2x2 (in/yd) (Generic) 3 x Per Week/30 Days Electronic Signature(s) Signed: 01/23/2023 5:46:15 PM By: Worthy Keeler PA-C Signed: 01/29/2023 9:50:02 AM By: [name] Entered By: [name] on 01/23/2023 16:09:33 -------------------------------------------------------------------------------- Problem List Details Patient Name: Date of Service: [name], [name] 01/23/2023 3:30 PM Medical Record Number: [id number] Patient Account Number: [id number] Date of Birth/Sex: Treating RN: 03-Jul-1966 (57 y.o. [name] Primary Care Provider: Charlott [name] Other Clinician: Massie [name] Referring Provider: Treating Provider/Extender: [name] Weeks in Treatment: [place], [place] ([id number]) [id number] Page 6 of 10 ICD-10 Encounter Code Description Active Date MDM Diagnosis [phone] Other chronic osteomyelitis, left ankle and foot 06/15/2022 No Yes E11.621 Type 2 diabetes mellitus with foot ulcer 06/15/2022 No Yes L97.524 Non-pressure chronic ulcer of other part of left foot with necrosis of bone 06/15/2022 No Yes Inactive Problems Resolved Problems Electronic Signature(s) Signed: 01/23/2023 3:45:49 PM By:  Worthy Keeler PA-C Entered By: Worthy [name] on 01/23/2023 15:45:48 -------------------------------------------------------------------------------- Progress Note Details Patient Name: Date of Service: [name], [name] 01/23/2023 3:30 PM Medical Record Number: [id number] Patient Account Number: [id number] Date of Birth/Sex: Treating RN: 02/25/1966 (57 y.o. 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 Glenford [name] care:   ADMISSION6/15/2023This is a 57 year old poorly controlled type II diabetic (last A1c 11.5%). In January of this year, he presented to the hospital with an abscess in his left foot. He also had a foreign body present. It was removed in the ER and he was admitted to the hospital for IV antibiotics. He was subsequently discharged on oral antibiotics. Due to financial concerns, he has not taken his diabetic medications (metformin and 70/30 insulin) and as a result, presented back to the emergency room on June 1 with worsening findings, including frank pus and osteomyelitis on MRI. BKA was recommended, but he declined. He was taken to the operating room by [name] who performed an extensive debridement. Cultures were taken and he was placed on IV antibiotics. He saw infectious disease while in the hospital who prescribed a 6-week course of IV antibiotics including cefazolin as well as oral metronidazole and levofloxacin. The infectious disease provider also indicated that he would benefit from below-knee amputation, but the patient desired another opinion and therefore he has been referred to wound care center for further evaluation  and management. ABIs obtained while he was in the hospital demonstrate adequate blood flow. Essentially the entire bottom of the patient's left foot has been removed. The heads of the majority of his metatarsal bones are exposed along with their accompanying tendons. Muscle and fat are exposed as well. The wound surface is fairly dry; the patient reports that he was not [name] any specific wound care instructions other than to place a dry dressing on it after showering. There is no odor or purulent drainage identified. 05/26/2022: The wound measures a little bit smaller today. There is some good granulation tissue beginning to form on the surface. He still has exposed bone and tendon. There is some slough accumulation. He saw infectious disease earlier this week and was frustrated that they recommended amputation. He is  interested in another surgical opinion. He continues to smoke but says that he has cut down. He is using a knee scooter for mobility so that he can stay off of his foot. 06/05/2022: He saw [name] last week who was supportive of our efforts to try to salvage the foot. I referred him to vascular surgery to see [name] but he has not received an appointment yet. He continues on IV antibiotics. He says that after our conversation last week about him being unable to initiate hyperbarics if he was going to continue to smoke, he has not had a cigarette since. He is now at 4 weeks of conventional management and his insurance was [place], [place] ([id number]) [id number] Page 7 of 10 initiated on July 1. Today, the wound continues to show evidence of improvement. There is good granulation tissue forming. The metatarsals are still exposed but actually have some pink periosteum. There is still some slough and fibrinous exudate present. No odor or purulent drainage. We have just been using Dakin's dressing changes for now. Admission to [place] at [facility]: 06-15-2022  upon evaluation today patient presents for initial inspection here in our clinic though he has been seen by Dr. Celine Ahr in [place] up to this point. I did review her notes and records as well they have been using Dakin's moistened gauze dressing which has done a great job at helping to clean out the bottom of his foot. Please see the discourse above for additional information in regard to treatment course up until the patient arrives here in the [place] clinic today. The patient is ready to get started with HBO therapy as soon as possible I do think based on all my review of his records this seems to be appropriate he does have chronic osteomyelitis is also been offered amputation this is obviously a limb salvage operation here. We are trying to prevent him from having to undergo an extensive amputation which would likely be a below-knee amputation which in turn is going to affect his quality of life he wants to try to avoid that if at all possible I think hyperbaric oxygen therapy is his best bet to achieve that goal. Currently he has been on IV Ancef. That actually ends tomorrow. Following that according to notes it appears he supposed to be taking Levaquin and Flagyl for an additional period of time although I am not certain exactly how long that with the. He has been seeing regional physicians infectious disease in [place] and again that so I recommended that he contact today in order to find out what the plan is going forward. His most recent hemoglobin A1c as noted above was 11.5 on 05-07-2022 he tells me trying to work on that he is also quit smoking as of this point. 06-22-2022 upon evaluation today patient appears to be doing well currently in regard to his wound all things considered. He is showing signs of a lot of new granulation tissue and overall very pleased. He is changing this once a day with the Dakin's moistened gauze dressing. Fortunately I do not see any evidence of infection  locally or systemically at this time. We have been trying to get him into the hyperbaric chamber he was having a lot of issues with sinus pressure and we have made referral and called multiple ENT specialist the earliest we have been able to get his August 9 at [facility] ear nose throat. They are going to try to work him in sooner if they  have any opportunities to do so. Obviously I think the sooner he can do this the better. We need to get him in hyperbarics as quickly as possible. 7/26; patient presents for follow-up. He has been using Dakin's wet-to-dry dressings and oil emollient dressing over the exposed bone. He currently denies systemic signs of infection. He states he is scheduled to see infectious disease, Dr Candiss Norse next week. She had ordered an MRI of the left foot and he is scheduled to have this done tomorrow. He currently denies systemic signs of infection Including fever/chills, nausea/vomiting increased warmth or erythema to the wound bed or purulent drainage. He started hyperbaric oxygen treatment however did not tolerate this due to sinus pressure. He is scheduled to see ENT in 2 weeks for evaluation. He is not continuing HBO until he is cleared by ENT. 07-06-2022 patient presents today for follow-up concerning his plantar foot ulcer. The good news is this actually is significantly better compared to previous. Some of the bone which is necrotic is starting to slough off and I am going to perform debridement to clear this away today. Nonetheless I do think that the patient is going to need hyperbarics and we need to try to get it as soon as possible. He still having a lot of sinus pressure I can even hear the congestion today. He is on 3 different antibiotics cefadroxil, metronidazole, and Levaquin currently for the osteomyelitis. For that reason this should actually help with the infection if he does have a chronic sinusitis I am also going to see about getting him on steroids to try to see  if this can benefit him as well. He voiced understanding and he is in agreement with giving that shot. This is probably can to spike his blood sugars which I discussed with him he is going to be seeing his primary care provider later today I want him to let her know as well what is going on and why so that she could be aware but I really do think he needs to take the prednisone. We need to get him in the chamber as soon as possible to try to help with getting this wound to heal and closed so that he does not lose his foot. 07-13-2022 upon evaluation today patient appears to be doing better in regard to his wound he continues to show signs of new granulation am going to perform some debridement today but overall this is doing quite well. 07-20-2022 upon evaluation today patient's wound is actually showing some signs of improvement he does have 1 area on the fifth metatarsal region where there is some necrotic tendon that is noted at this point. Unfortunately this is preventing good granulation and over this point the rest of the metatarsal region is completely covered over with granulation tissue which is great news there is some other slough and biofilm buildup that I will clearway as well. 07-27-2022 upon evaluation today patient appears to be doing well currently in regard to his plantar foot ulcer. He has been tolerating the dressing changes without complication fortunately. I do believe he is making excellent progress and each time I see him this is better and better is pretty much completely covered as far as any bone exposure is concerned and he still is on the IV antibiotics were still also undergoing hyperbarics at this point that he just got started and is doing quite well with since getting the tubes in his ears.Marland Kitchen 08-03-2022 upon evaluation today patient appears to be  doing well in general although has been having a lot of drainage compared to normal. It was actually leaking through his dressing  today. He tells me he changed it last night. With that being said he has been up on his feet a lot more which I think is probably a big part of the issue here. Fortunately I do not see any signs of infection locally or systemically. 08-17-2022 upon evaluation today patient is doing well with regard to his wound this is actually measuring smaller which is good news. I am very pleased in that regard. With that being said he has not been participating HBO therapy as he really needs to afford to see this heal appropriately and completely. In fact he has only made 9 total HBO treatments in the past 4 weeks. Again this is not therapeutic and I discussed that with the patient. Nonetheless he is still improving and since the hyperbarics is not helping based on the discussion today the patient is going to discontinue HBO therapy at this point. He tells me that getting here 5 days a week just is not possible. He has had difficulty getting here and then also when he was here had difficulty with getting here at the right time which has also been part of the problem. Nonetheless either way I am pleased that he is doing better and my hope is that we will still be able to get him healed obviously he should continue with the antibiotics as well as the wound care as he has been doing this seems to be doing excellent for him but everything is improving quite nicely. 08-24-2022 upon evaluation today patient appears to be doing well currently in regard to his wound this is actually measuring significantly better even compared to last week. I am very pleased in that regard I do not see any signs of infection and I think he is making excellent progress here. 08-31-2022 upon evaluation today patient's wound is actually showing signs of significant improvement. I am actually very pleased with where things stand currently. There does not appear to be any evidence of active infection locally or systemically at this time. No fevers,  chills, nausea, vomiting, or diarrhea. 09-19-2022 upon evaluation patient's wound is actually showing signs of improvement though it still having quite a bit of drainage. I do believe he would benefit from switching over to [facility]. I discussed that with him today. He is definitely in agreement with that plan. 09-28-2022 upon evaluation patient's wound is actually showing signs of improvement. With that being said he has been using the [facility] but he has not had enough to really cover the whole wound. In fact he tells me he just got his supplies today that we had ordered last week. Fortunately I do not see any evidence of active infection at this time. 10-05-2022 upon evaluation today patient appears to be doing well currently in regard to his foot wound there is actually much less debridement today to be done compared to previous. Fortunately I see no signs of active infection locally or systemically at this time. 11-02-2022 upon evaluation today patient appears to be doing well currently in regard to his wounds on the foot. Fortunately he seems to be making good progress is not quite as much of an improvement this week as it was last time I saw him but he had a lot going on and tells me that he has been up moving on it more. I do believe he  needs to try to get this under control and I do believe staying off of it more will definitely help in that regard. 11-09-2022 upon evaluation today patient appears to be doing well currently in regard to his wound. He is showing signs of improvement which is great news. Fortunately I do not see any evidence of active infection locally nor systemically at this time. No fever or chills noted 11-16-2022 patient's wound still slowly seems to be making progress. The good news is we have gotten the approval for the Apligraf which is awesome. Working to see about getting that started form hopefully will be able to get to him for the end of the year and then  subsequently we can see if we can regain the approval for the final 3 in the first of the new year. Nonetheless I think getting this started as quickly as possible would be good for the patient currently. [name], [name] ([id number]) [id number] Page 8 of 10 do believe this is going to have a good result as far as helping with the healing and new tissue growth and epithelization in regard to the wound. He still using the offloading with a knee scooter at this point. 11-23-2022 upon evaluation today patient appears to be doing well currently in regard to his wound he does have a lot of drainage but fortunately no signs of infection at this point. Fortunately there does not appear to be any signs of active infection locally nor systemically at this time which is great news and overall I am extremely pleased with regard to where we stand. No fevers, chills, nausea, vomiting, or diarrhea. 12/28; patient arrived for Apligraf #2 today. The area is large and on the left plantar foot some irregular areas of hypergranulation however most of this looks clean. I think we are changing weekly because of drainage. 12-22-2022 upon evaluation today patient appears to be doing well currently in regard to his wound. He is not showing any signs of active infection which is great news. No fevers, chills, nausea, vomiting, or diarrhea. 01-09-2023 upon evaluation today patient's foot actually appears to be doing well I have not seen him since 19 January but he has made some progress. The only downside is unfortunately his scooter has broken his hand order a piece to her. It is on the way. As soon as it arrives he will get back on that which will definitely help him significantly. 01-16-2023 upon evaluation today patient appears to be doing well currently in regard to his wound. Its about the same it does not look like there is any breakdown or worsening but it also does not look like it significantly  smaller either. Fortunately I do not see any evidence of infection at this time. He does tell me that he got a new cable for his knee scooter but he has not gotten it fixed yet. He just got that today he tells me. He also of note has been up helping his brother to paint because "I was bored". 01-23-2023 upon evaluation today patient's wound is actually showing signs of improvement already which is great news. He just got a scooter going last week after I saw him and he has done much better with the offloading as soon as he got the knee scooter back in action. In general I am very pleased with where we stand currently. I do not see any signs of active infection locally nor systemically which is great news. Objective Constitutional Well-nourished and well-hydrated in  no acute distress. Vitals Time Taken: 3:44 PM, Height: 70 in, Weight: 230 lbs, BMI: 33, Temperature: 98.1 F, Pulse: 97 bpm, Respiratory Rate: 16 breaths/min, Blood Pressure: 127/85 mmHg. Respiratory normal breathing without difficulty. Psychiatric this patient is able to make decisions and demonstrates good insight into disease process. Alert and Oriented x 3. pleasant and cooperative. General Notes: I am going to recommend that we have the patient continue to monitor for any signs of infection or worsening. In general I think that we are really doing quite well here. I am going to suggest as well we performed debridement I did clearway the necrotic debris on the surface of the wound including slough and biofilm down to good subcutaneous tissue as well as the callus around the edges of the wound he tolerated all the above without complication. Integumentary (Hair, Skin) Wound #1 status is Open. Original cause of wound was Gradually Appeared. The date acquired was: 12/04/2021. The wound has been in treatment 31 weeks. The wound is located on the [place]. The wound measures 5cm length x 5.4cm width x 0.2cm depth; 21.206cm^2 area  and 4.241cm^3 volume. There is Fat Layer (Subcutaneous Tissue) exposed. There is undermining starting at 2:00 and ending at 3:00 with a maximum distance of 0.5cm. There is a medium amount of serosanguineous drainage noted. There is medium (34-66%) pink, hyper - granulation within the wound bed. There is a medium (34-66%) amount of necrotic tissue within the wound bed including Adherent Slough. Assessment Active Problems ICD-10 Other chronic osteomyelitis, left ankle and foot Type 2 diabetes mellitus with foot ulcer Non-pressure chronic ulcer of other part of left foot with necrosis of bone Procedures Wound #1 Pre-procedure diagnosis of Wound #1 is a Diabetic Wound/Ulcer of the Lower Extremity located on the [place] .Severity of Tissue Pre Debridement is: Fat layer exposed. There was a Excisional Skin/Subcutaneous Tissue Debridement with a total area of 33 sq cm performed by [name]., PA-C. With the following instrument(s): Curette to remove Viable and Non-Viable tissue/material. Material removed includes Callus, Subcutaneous Tissue, and Slough. A time out was conducted at 16:03, prior to the start of the procedure. A Minimum amount of bleeding was controlled with Pressure. The procedure was tolerated well. Post Debridement Measurements: 5cm length x 5.4cm width x 0.2cm depth; 4.241cm^3 volume. Character of Wound/Ulcer Post Debridement is stable. Severity of Tissue Post Debridement is: Fat layer exposed. Post procedure Diagnosis Wound #1: Same as Pre-Procedure [name], [name] ([id number]) [phone].pdf Page 9 of 10 Plan Follow-up Appointments: Return Appointment in 1 week. Anesthetic (Use 'Patient Medications' Section for Anesthetic Order Entry): Lidocaine applied to wound bed Edema Control - Lymphedema / Segmental Compressive Device / Other: Elevate, Exercise Daily and Avoid Standing for Long Periods of Time. Elevate legs to the level of the heart and  pump ankles as often as possible Elevate leg(s) parallel to the floor when sitting. Off-Loading: Other: - knee scooter WOUND #1: - Foot Wound Laterality: Plantar, Left Peri-Wound Care: AandD Ointment 3 x Per Week/30 Days Discharge Instructions: Apply AandD Ointment as directed Prim Dressing: Hydrofera Blue Ready Transfer Foam, 2.5x2.5 (in/in) 3 x Per Week/30 Days ary Discharge Instructions: Apply Hydrofera Blue Ready to wound bed as directed Secondary Dressing: ABD Pad 5x9 (in/in) (Generic) 3 x Per Week/30 Days Discharge Instructions: Cover with ABD pad Secondary Dressing: Kerlix 4.5 x 4.1 (in/yd) (Generic) 3 x Per Week/30 Days Discharge Instructions: Apply Kerlix 4.5 x 4.1 (in/yd) as instructed Secured With: Medipore T - 35M Medipore H Soft  Cloth Surgical T ape ape, 2x2 (in/yd) (Generic) 3 x Per Week/30 Days 1. I am going to suggest that we have the patient continue with the [facility] which I think is doing quite well. 2. I am also can recommend that the patient should continue to monitor for any signs of infection or worsening. Obviously I believe that were doing well and I am hopeful that we will continue to see signs of good improvement. We will see patient back for reevaluation in 1 week here in the clinic. If anything worsens or changes patient will contact our office for additional recommendations. Electronic Signature(s) Signed: 01/23/2023 5:08:42 PM By: Worthy Keeler PA-C Entered By: Worthy [name] on 01/23/2023 17:08:42 -------------------------------------------------------------------------------- SuperBill Details Patient Name: Date of Service: Matthew [name], [name] 01/23/2023 Medical Record Number: [id number] Patient Account Number: [id number] Date of Birth/Sex: Treating RN: 06/03/1966 (57 y.o. [name] Primary Care Provider: Charlott [name] Other Clinician: Massie [name] Referring Provider: Treating Provider/Extender: [name] Weeks in Treatment:  31 Diagnosis Coding ICD-10 Codes Code Description [phone] Other chronic osteomyelitis, left ankle and foot E11.621 Type 2 diabetes mellitus with foot ulcer L97.524 Non-pressure chronic ulcer of other part of left foot with necrosis of bone Facility Procedures : [facility] Code: [id number] EE ([id number]) Description: [id number] - DEB SUBQ TISSUE 20 SQ CM/< ICD-10 Diagnosis Description [phone] L97.524 Non-pressure chronic ulcer of other part of left foot with necrosis of bone Modifier: [id number] Page Quantity: 1 10 of 10 : CPT4 Code: [id number] 11 I Description: 045 - DEB SUBQ TISS EA ADDL 20CM CD-10 Diagnosis Description L97.524 Non-pressure chronic ulcer of other part of left foot with necrosis of bone Modifier: 1 Quantity: Physician Procedures : CPT4 Code Description Modifier [id number] - WC PHYS SUBQ TISS 20 SQ CM ICD-10 Diagnosis Description L97.524 Non-pressure chronic ulcer of other part of left foot with necrosis of bone Quantity: 1 : [id number] - WC PHYS SUBQ TISS EA ADDL 20 CM ICD-10 Diagnosis Description L97.524 Non-pressure chronic ulcer of other part of left foot with necrosis of bone Quantity: 1 Electronic Signature(s) Signed: 01/23/2023 5:23:31 PM By: Worthy Keeler PA-C Entered By: Worthy [name] on 01/23/2023 17:23:30

## 2023-01-29 ENCOUNTER — Other Ambulatory Visit: Payer: Self-pay

## 2023-01-29 ENCOUNTER — Encounter: Payer: Medicaid Other | Admitting: Physician Assistant

## 2023-01-29 DIAGNOSIS — E11621 Type 2 diabetes mellitus with foot ulcer: Secondary | ICD-10-CM | POA: Diagnosis not present

## 2023-01-29 DIAGNOSIS — E1151 Type 2 diabetes mellitus with diabetic peripheral angiopathy without gangrene: Secondary | ICD-10-CM | POA: Diagnosis not present

## 2023-01-29 DIAGNOSIS — M86672 Other chronic osteomyelitis, left ankle and foot: Secondary | ICD-10-CM | POA: Diagnosis not present

## 2023-01-29 DIAGNOSIS — L97524 Non-pressure chronic ulcer of other part of left foot with necrosis of bone: Secondary | ICD-10-CM | POA: Diagnosis not present

## 2023-01-29 DIAGNOSIS — L02612 Cutaneous abscess of left foot: Secondary | ICD-10-CM | POA: Diagnosis not present

## 2023-01-29 DIAGNOSIS — L97522 Non-pressure chronic ulcer of other part of left foot with fat layer exposed: Secondary | ICD-10-CM | POA: Diagnosis not present

## 2023-01-29 DIAGNOSIS — F17200 Nicotine dependence, unspecified, uncomplicated: Secondary | ICD-10-CM | POA: Diagnosis not present

## 2023-01-29 DIAGNOSIS — E1165 Type 2 diabetes mellitus with hyperglycemia: Secondary | ICD-10-CM | POA: Diagnosis not present

## 2023-01-29 NOTE — Progress Notes (Signed)
[name], [name] ([id number]) [id number] Page 1 of 8 Visit Report for 01/23/2023 Arrival Information Details Patient Name: Date of Service: [name], [name] 01/23/2023 3:30 PM Medical Record Number: [id number] Patient Account Number: [id number] Date of Birth/Sex: Treating RN: Feb 01, 1966 (57 y.o. [name] Primary Care Tippi Mccrae: Charlott Rakes Other Clinician: Massie Kluver Referring Vienne Corcoran: Treating Alphia Behanna/Extender: Sherene Sires Weeks in Treatment: 31 Visit Information History Since Last Visit All ordered tests and consults were completed: No Patient Arrived: Knee Scooter Added or deleted any medications: No Arrival Time: 15:41 Any new allergies or adverse reactions: No Transfer Assistance: None Had a fall or experienced change in No Patient Identification Verified: Yes activities of daily living that may affect Secondary Verification Process Completed: Yes risk of falls: Patient Requires Transmission-Based Precautions: No Signs or symptoms of abuse/neglect since last visito No Patient Has Alerts: No Hospitalized since last visit: No Implantable device outside of the clinic excluding No cellular tissue based products placed in the center since last visit: Has Dressing in Place as Prescribed: Yes Has Footwear/Offloading in Place as Prescribed: Yes Left: Other:knee scooter Pain Present Now: No Electronic Signature(s) Signed: 01/29/2023 9:50:02 AM By: Massie Kluver Entered By: Massie Kluver on 01/23/2023 15:44:11 -------------------------------------------------------------------------------- Clinic Level of Care Assessment Details Patient Name: Date of Service: [name], [name] 01/23/2023 3:30 PM Medical Record Number: [id number] Patient Account Number: [id number] Date of Birth/Sex: Treating RN: [birth year] (57 y.o. [name] Primary Care Ryah Cribb: Charlott Rakes Other Clinician: Massie Kluver Referring Elray Dains: Treating  Coady Train/Extender: Sherene Sires Weeks in Treatment: 31 Clinic Level of Care Assessment Items TOOL 1 Quantity Score '[]'$  - 0 Use when EandM and Procedure is performed on INITIAL visit ASSESSMENTS - Nursing Assessment / Reassessment '[]'$  - 0 General Physical Exam (combine w/ comprehensive assessment (listed just below) when performed on new pt. evalsLester Imming, [place] ([id number]) [id number] Page 2 of 8 '[]'$  - 0 Comprehensive Assessment (HX, ROS, Risk Assessments, Wounds Hx, etc.) ASSESSMENTS - Wound and Skin Assessment / Reassessment '[]'$  - 0 Dermatologic / Skin Assessment (not related to wound area) ASSESSMENTS - Ostomy and/or Continence Assessment and Care '[]'$  - 0 Incontinence Assessment and Management '[]'$  - 0 Ostomy Care Assessment and Management (repouching, etc.) PROCESS - Coordination of Care '[]'$  - 0 Simple Patient / Family Education for ongoing care '[]'$  - 0 Complex (extensive) Patient / Family Education for ongoing care '[]'$  - 0 Staff obtains Programmer, systems, Records, T Results / Process Orders est '[]'$  - 0 Staff telephones HHA, Nursing Homes / Clarify orders / etc '[]'$  - 0 Routine Transfer to another Facility (non-emergent condition) '[]'$  - 0 Routine Hospital Admission (non-emergent condition) '[]'$  - 0 New Admissions / Biomedical engineer / Ordering NPWT Apligraf, etc. , '[]'$  - 0 Emergency Hospital Admission (emergent condition) PROCESS - Special Needs '[]'$  - 0 Pediatric / Minor Patient Management '[]'$  - 0 Isolation Patient Management '[]'$  - 0 Hearing / Language / Visual special needs '[]'$  - 0 Assessment of Community assistance (transportation, D/C planning, etc.) '[]'$  - 0 Additional assistance / Altered mentation '[]'$  - 0 Support Surface(s) Assessment (bed, cushion, seat, etc.) INTERVENTIONS - Miscellaneous '[]'$  - 0 External ear exam '[]'$  - 0 Patient Transfer (multiple staff / Civil Service fast streamer / Similar devices) '[]'$  - 0 Simple Staple / Suture removal (25 or  less) '[]'$  - 0 Complex Staple / Suture removal (26 or more) '[]'$  - 0 Hypo/Hyperglycemic Management (do not check if billed separately) '[]'$  - 0 Ankle / Brachial Index (ABI) - do not check  if billed separately Has the patient been seen at the hospital within the last three years: Yes Total Score: 0 Level Of Care: ____ Electronic Signature(s) Signed: 01/29/2023 9:50:02 AM By: Massie Kluver Entered By: Massie Kluver on 01/23/2023 16:09:39 -------------------------------------------------------------------------------- Encounter Discharge Information Details Patient Name: Date of Service: Matthew [name], [name] 01/23/2023 3:30 PM Medical Record Number: [id number] Patient Account Number: [id number] Date of Birth/Sex: Treating RN: [birth year] (57 y.o. [name] [place], [place] ([id number]) [phone].pdf Page 3 of 8 Primary Care Jonni Oelkers: Charlott Rakes Other Clinician: Massie Kluver Referring Brayam Boeke: Treating Darrel Baroni/Extender: Sherene Sires Weeks in Treatment: 36 Encounter Discharge Information Items Post Procedure Vitals Discharge Condition: Stable Temperature (F): 98.1 Ambulatory Status: Knee Scooter Pulse (bpm): 97 Discharge Destination: Home Respiratory Rate (breaths/min): 18 Transportation: Private Auto Blood Pressure (mmHg): 127/85 Accompanied By: self Schedule Follow-up Appointment: Yes Clinical Summary of Care: Electronic Signature(s) Signed: 01/29/2023 9:50:02 AM By: Massie Kluver Entered By: Massie Kluver on 01/23/2023 16:21:40 -------------------------------------------------------------------------------- Lower Extremity Assessment Details Patient Name: Date of Service: [name], [name] 01/23/2023 3:30 PM Medical Record Number: [id number] Patient Account Number: [id number] Date of Birth/Sex: Treating RN: Jun 30, 1966 (58 y.o. [name] Primary Care Armistead Sult: Charlott Rakes Other Clinician: Massie Kluver Referring Lennox Dolberry: Treating  Myeasha Ballowe/Extender: Sherene Sires Weeks in Treatment: 31 Electronic Signature(s) Signed: 01/25/2023 11:31:26 AM By: Gretta Cool BSN, RN, CWS, Kim RN, BSN Signed: 01/29/2023 9:50:02 AM By: Massie Kluver Entered By: Massie Kluver on 01/23/2023 15:55:32 -------------------------------------------------------------------------------- Multi Wound Chart Details Patient Name: Date of Service: Matthew ADOLPHUS, DENSLOW 01/23/2023 3:30 PM Medical Record Number: [id number] Patient Account Number: [id number] Date of Birth/Sex: Treating RN: 1966/08/08 (57 y.o. [name] Primary Care Ademide Schaberg: Charlott Rakes Other Clinician: Massie Kluver Referring Mukesh Kornegay: Treating Aizen Duval/Extender: Sherene Sires Weeks in Treatment: 31 Vital Signs Height(in): 70 Pulse(bpm): 97 Weight(lbs): 230 Blood Pressure(mmHg): 127/85 Body Mass Index(BMI): 33 Temperature(F): 98.1 Respiratory Rate(breaths/min): [place], Christophr ([id number]) [phone].pdf Page 4 of 8 [1:Photos:] [N/A:N/A] Left, Plantar Foot N/A N/A Wound Location: Gradually Appeared N/A N/A Wounding Event: Diabetic Wound/Ulcer of the Lower N/A N/A Primary Etiology: Extremity Type II Diabetes N/A N/A Comorbid History: 12/04/2021 N/A N/A Date Acquired: 22 N/A N/A Weeks of Treatment: Open N/A N/A Wound Status: No N/A N/A Wound Recurrence: Yes N/A N/A Pending A mputation on Presentation: 5x5.4x0.2 N/A N/A Measurements L x W x D (cm) 21.206 N/A N/A A (cm) : rea 4.241 N/A N/A Volume (cm) : 78.90% N/A N/A % Reduction in A rea: 91.60% N/A N/A % Reduction in Volume: 2 Starting Position 1 (o'clock): 3 Ending Position 1 (o'clock): 0.5 Maximum Distance 1 (cm): Yes N/A N/A Undermining: Grade 4 N/A N/A Classification: Medium N/A N/A Exudate A mount: Serosanguineous N/A N/A Exudate Type: red, brown N/A N/A Exudate Color: Medium (34-66%) N/A N/A Granulation A mount: Pink, Hyper-granulation N/A  N/A Granulation Quality: Medium (34-66%) N/A N/A Necrotic A mount: Fat Layer (Subcutaneous Tissue): Yes N/A N/A Exposed Structures: Fascia: No Tendon: No Muscle: No Joint: No Bone: No None N/A N/A Epithelialization: Treatment Notes Electronic Signature(s) Signed: 01/29/2023 9:50:02 AM By: Massie Kluver Entered By: Massie Kluver on 01/23/2023 15:55:53 -------------------------------------------------------------------------------- Multi-Disciplinary Care Plan Details Patient Name: Date of Service: Matthew BRAELYNN, SCACCO 01/23/2023 3:30 PM Medical Record Number: [id number] Patient Account Number: [id number] Date of Birth/Sex: Treating RN: 1966-02-13 (57 y.o. [name] Primary Care Yalissa Fink: Charlott Rakes Other Clinician: Massie Kluver Referring Issam Carlyon: Treating Lea Baine/Extender: Sherene Sires Weeks in Treatment: [address] [place] ([id number]) [id number] Page 5 of 8  Active Inactive Osteomyelitis Nursing Diagnoses: Infection: osteomyelitis Knowledge deficit related to disease process and management Potential for infection: osteomyelitis Goals: Diagnostic evaluation for osteomyelitis completed as ordered Date Initiated: 06/28/2022 Date Inactivated: 08/03/2022 Target Resolution Date: 06/28/2022 Goal Status: Met Patient/caregiver will verbalize understanding of disease process and disease management Date Initiated: 06/28/2022 Date Inactivated: 08/31/2022 Target Resolution Date: 06/28/2022 Goal Status: Met Patient's osteomyelitis will resolve Date Initiated: 06/28/2022 Target Resolution Date: 12/17/2022 Goal Status: Active Signs and symptoms for osteomyelitis will be recognized and promptly addressed Date Initiated: 06/28/2022 Date Inactivated: 08/03/2022 Target Resolution Date: 06/28/2022 Goal Status: Met Interventions: Assess for signs and symptoms of osteomyelitis resolution every visit Provide education on osteomyelitis Screen for  HBO Notes: Electronic Signature(s) Signed: 01/25/2023 11:31:26 AM By: Gretta Cool, BSN, RN, CWS, Kim RN, BSN Signed: 01/29/2023 9:50:02 AM By: Massie Kluver Entered By: Massie Kluver on 01/23/2023 16:09:54 -------------------------------------------------------------------------------- Pain Assessment Details Patient Name: Date of Service: Matthew AMAHD, BRAKEMAN 01/23/2023 3:30 PM Medical Record Number: [id number] Patient Account Number: [id number] Date of Birth/Sex: Treating RN: 05/11/1966 (57 y.o. [name] Primary Care Maye Parkinson: Charlott Rakes Other Clinician: Massie Kluver Referring Jubal Rademaker: Treating Moxie Kalil/Extender: Sherene Sires Weeks in Treatment: 31 Active Problems Location of Pain Severity and Description of Pain Patient Has Paino No Site Locations [place], [place] ([id number]) [phone].pdf Page 6 of 8 Pain Management and Medication Current Pain Management: Electronic Signature(s) Signed: 01/25/2023 11:31:26 AM By: Gretta Cool, BSN, RN, CWS, Kim RN, BSN Signed: 01/29/2023 9:50:02 AM By: Massie Kluver Entered By: Massie Kluver on 01/23/2023 15:52:21 -------------------------------------------------------------------------------- Patient/Caregiver Education Details Patient Name: Date of Service: Matthew JOHAN, [name] 2/20/2024andnbsp3:30 PM Medical Record Number: [id number] Patient Account Number: [id number] Date of Birth/Gender: Treating RN: June 16, 1966 (57 y.o. [name] Primary Care Physician: Charlott Rakes Other Clinician: Massie Kluver Referring Physician: Treating Physician/Extender: Sherene Sires Weeks in Treatment: 50 Education Assessment Education Provided To: Patient Education Topics Provided Wound/Skin Impairment: Handouts: Other: continue wound care as directed Methods: Explain/Verbal Responses: State content correctly Electronic Signature(s) Signed: 01/29/2023 9:50:02 AM By: Massie Kluver Entered By: Massie Kluver on  01/23/2023 [place], [place] ([id number] Page 7 of 8 -------------------------------------------------------------------------------- Wound Assessment Details Patient Name: Date of Service: MUKTAR, BLOMBERG 01/23/2023 3:30 PM Medical Record Number: [id number] Patient Account Number: [id number] Date of Birth/Sex: Treating RN: March 10, 1966 (57 y.o. Isac Sarna, Maudie Mercury Primary Care Jakki Doughty: Charlott Rakes Other Clinician: Massie Kluver Referring Ameira Alessandrini: Treating Khaza Blansett/Extender: Sherene Sires Weeks in Treatment: 31 Wound Status Wound Number: 1 Primary Etiology: Diabetic Wound/Ulcer of the Lower Extremity Wound Location: Left, Plantar Foot Wound Status: Open Wounding Event: Gradually Appeared Comorbid History: Type II Diabetes Date Acquired: 12/04/2021 Weeks Of Treatment: 31 Clustered Wound: No Pending Amputation On Presentation Photos Wound Measurements Length: (cm) 5 Width: (cm) 5.4 Depth: (cm) 0.2 Area: (cm) 21.206 Volume: (cm) 4.241 % Reduction in Area: 78.9% % Reduction in Volume: 91.6% Epithelialization: None Undermining: Yes Starting Position (o'clock): 2 Ending Position (o'clock): 3 Maximum Distance: (cm) 0.5 Wound Description Classification: Grade 4 Exudate Amount: Medium Exudate Type: Serosanguineous Exudate Color: red, brown Foul Odor After Cleansing: No Slough/Fibrino Yes Wound Bed Granulation Amount: Medium (34-66%) Exposed Structure Granulation Quality: Pink, Hyper-granulation Fascia Exposed: No Necrotic Amount: Medium (34-66%) Fat Layer (Subcutaneous Tissue) Exposed: Yes Necrotic Quality: Adherent Slough Tendon Exposed: No Muscle Exposed: No Joint Exposed: No Bone Exposed: No Treatment Notes Wound #1 (Foot) Wound Laterality: Plantar, Left [place], Gael ([id number]) [phone].pdf Page 8 of 8 Peri-Wound Care AandD Ointment Discharge Instruction: Apply AandD Ointment as  directed Topical Primary Dressing Hydrofera Blue Ready Transfer Foam, 2.5x2.5 (in/in) Discharge Instruction: Apply Hydrofera Blue Ready to wound bed as directed Secondary Dressing ABD Pad 5x9 (in/in) Discharge Instruction: Cover with ABD pad Kerlix 4.5 x 4.1 (in/yd) Discharge Instruction: Apply Kerlix 4.5 x 4.1 (in/yd) as instructed Secured With Medipore T - 27M Medipore H Soft Cloth Surgical T ape ape, 2x2 (in/yd) Compression Wrap Compression Stockings Environmental education officer) Signed: 01/25/2023 11:31:26 AM By: Gretta Cool, BSN, RN, CWS, Kim RN, BSN Signed: 01/29/2023 9:50:02 AM By: Massie Kluver Entered By: Massie Kluver on 01/23/2023 15:53:42 -------------------------------------------------------------------------------- Vitals Details Patient Name: Date of Service: Matthew ARHAM, ECONOMIDES 01/23/2023 3:30 PM Medical Record Number: [id number] Patient Account Number: [id number] Date of Birth/Sex: Treating RN: 1966/07/03 (57 y.o. Isac Sarna, Maudie Mercury Primary Care Eashan Schipani: Charlott Rakes Other Clinician: Massie Kluver Referring Debbe Crumble: Treating Marris Frontera/Extender: Sherene Sires Weeks in Treatment: 31 Vital Signs Time Taken: 15:44 Temperature (F): 98.1 Height (in): 70 Pulse (bpm): 97 Weight (lbs): 230 Respiratory Rate (breaths/min): 16 Body Mass Index (BMI): 33 Blood Pressure (mmHg): 127/85 Reference Range: 80 - 120 mg / dl Electronic Signature(s) Signed: 01/29/2023 9:50:02 AM By: Massie Kluver Entered By: Massie Kluver on 01/23/2023 15:46:28

## 2023-01-30 NOTE — Progress Notes (Signed)
[name], [name] ([id number]) [id number] Page 1 of 7 Visit Report for 01/29/2023 Arrival Information Details Patient Name: Date of Service: [name] AARNAV, NEGLIA 01/29/2023 12:45 PM Medical Record Number: [id number] Patient Account Number: [id number] Date of Birth/Sex: Treating RN: 1966/07/19 (57 y.o. [name], [place] [facility] Jazzlene Huot: Charlott Rakes Other Clinician: Referring Marcile Fuquay: Treating Kimball Manske/Extender: Sherene Sires Weeks in Treatment: 32 Visit Information History Since Last Visit Has Footwear/Offloading in Place as Prescribed: Yes Patient Arrived: Knee Scooter Left: Other:knee scooter Arrival Time: 12:43 Pain Present Now: No Accompanied By: self Transfer Assistance: None Patient Identification Verified: Yes Patient Requires Transmission-Based Precautions: No Patient Has Alerts: No Electronic Signature(s) Signed: 01/29/2023 2:51:50 PM By: Lawanda Cousins Entered By: Lawanda Cousins on 01/29/2023 14:51:10 -------------------------------------------------------------------------------- Encounter Discharge Information Details Patient Name: Date of Service: [name], [name] 01/29/2023 12:45 PM Medical Record Number: [id number] Patient Account Number: [id number] Date of Birth/Sex: Treating RN: Nov 12, 1966 (57 y.o. [name], [place] Primary Care Everardo Voris: Charlott Rakes Other Clinician: Referring Kasim Mccorkle: Treating Breniyah Romm/Extender: Sherene Sires Weeks in Treatment: 64 Encounter Discharge Information Items Post Procedure Vitals Discharge Condition: Stable Temperature (F): 98.2 Ambulatory Status: Knee Scooter Pulse (bpm): 86 Schedule Follow-up Appointment: No Respiratory Rate (breaths/min): 20 Clinical Summary of Care: Blood Pressure (mmHg): 119/79 Electronic Signature(s) Signed: 01/29/2023 2:51:50 PM By: Lawanda Cousins Entered By: Lawanda Cousins on 01/29/2023 13:21:54 Carillo, Truman Hayward ([id number]) [id number] Page  2 of 7 -------------------------------------------------------------------------------- Lower Extremity Assessment Details Patient Name: Date of Service: [name], [name] 01/29/2023 12:45 PM Medical Record Number: [id number] Patient Account Number: [id number] Date of Birth/Sex: Treating RN: [birth year] (57 y.o. [name], [place] Primary Care Vona Whiters: Charlott Rakes Other Clinician: Referring Dannica Bickham: Treating Keath Matera/Extender: Sherene Sires Weeks in Treatment: 32 Vascular Assessment Pulses: Dorsalis Pedis Palpable: [Left:Yes] Posterior Tibial Palpable: [Left:Yes] Electronic Signature(s) Signed: 01/29/2023 2:51:50 PM By: Lawanda Cousins Entered By: Lawanda Cousins on 01/29/2023 12:52:23 -------------------------------------------------------------------------------- Multi Wound Chart Details Patient Name: Date of Service: [name], [name] 01/29/2023 12:45 PM Medical Record Number: [id number] Patient Account Number: [id number] Date of Birth/Sex: Treating RN: 1966-04-18 (57 y.o. [name], [name] Primary Care Canda Podgorski: Charlott Rakes Other Clinician: Referring Nazli Penn: Treating Melodye Swor/Extender: Sherene Sires Weeks in Treatment: 32 Vital Signs Height(in): 70 Pulse(bpm): 86 Weight(lbs): 230 Blood Pressure(mmHg): 119/79 Body Mass Index(BMI): 33 Temperature(F): 98.2 Respiratory Rate(breaths/min): 20 [1:Photos:] [N/A:N/A] GARRED, [name] ([id number]) [1:Left, Plantar Foot Wound Location: Gradually Appeared Wounding Event: Diabetic Wound/Ulcer of the Lower Primary Etiology: Extremity Type II Diabetes Comorbid History: 12/04/2021 Date Acquired: 32 Weeks of Treatment: Open Wound Status: No Wound  Recurrence: Yes Pending A mputation on Presentation: 5.5x5.3x0.7 Measurements L x W x D (cm) 22.894 A (cm) : rea 16.026 Volume (cm) : 77.20% % Reduction in A rea: 68.10% % Reduction in Volume: Grade 4 Classification: Medium Exudate A mount:  Serosanguineous Exudate Type: red,  brown Exudate Color: Distinct, outline attached Wound Margin: Medium (34-66%) Granulation A mount: Pink, Hyper-granulation Granulation Quality: None Present (0%) Necrotic A mount: Fat Layer (Subcutaneous Tissue): Yes  N/A Exposed Structures: Fascia: No Tendon: No Muscle: No Joint: No Bone: No None Epithelialization:] [N/A:N/A N/A N/A N/A N/A N/A N/A N/A N/A N/A N/A N/A N/A N/A N/A N/A N/A N/A N/A N/A N/A N/A N/A] Treatment Notes Electronic Signature(s) Signed: 01/29/2023 2:51:50 PM By: Lawanda Cousins Entered By: Lawanda Cousins on 01/29/2023 12:59:16 -------------------------------------------------------------------------------- Multi-Disciplinary Care Plan Details Patient Name: Date of Service: Matthew Matthew, [name] 01/29/2023 12:45 PM Medical Record Number: [id number] Patient Account Number: [id number] Date of Birth/Sex: Treating RN: 1966/06/29 (57  y.o. [name], [place] Primary Care Margerie Fraiser: Charlott Rakes Other Clinician: Referring Stephaun Million: Treating Carola Viramontes/Extender: Sherene Sires Weeks in Treatment: 2 Active Inactive Pressure Nursing Diagnoses: Knowledge deficit related to causes and risk factors for pressure ulcer development Goals: Patient/caregiver will verbalize understanding of pressure ulcer management Date Initiated: 01/29/2023 Target Resolution Date: 04/02/2023 Goal Status: Active Interventions: Assess offloading mechanisms upon admission and as needed Treatment Activities: JOHNANTHONY, LAPITAN ([id number]) [id number] Page 4 of 7 Pressure reduction/relief device ordered : 01/29/2023 Notes: Wound/Skin Impairment Nursing Diagnoses: Knowledge deficit related to ulceration/compromised skin integrity Goals: Patient/caregiver will verbalize understanding of skin care regimen Date Initiated: 06/15/2022 Target Resolution Date: 04/27/2023 Goal Status: Active Ulcer/skin breakdown will have a volume reduction of 30% by week 4 Date Initiated: 06/15/2022 Date  Inactivated: 08/03/2022 Target Resolution Date: 07/16/2022 Goal Status: Unmet Unmet Reason: comorbities Ulcer/skin breakdown will have a volume reduction of 50% by week 8 Date Initiated: 06/15/2022 Date Inactivated: 08/31/2022 Target Resolution Date: 08/16/2022 Goal Status: Unmet Unmet Reason: comorbdities Ulcer/skin breakdown will have a volume reduction of 80% by week 12 Date Initiated: 06/15/2022 Date Inactivated: 11/16/2022 Target Resolution Date: 09/15/2022 Goal Status: Unmet Unmet Reason: comorbidities Ulcer/skin breakdown will heal within 14 weeks Date Initiated: 06/15/2022 Date Inactivated: 11/16/2022 Target Resolution Date: 10/16/2022 Goal Status: Unmet Unmet Reason: comorbidities Interventions: Assess patient/caregiver ability to obtain necessary supplies Assess patient/caregiver ability to perform ulcer/skin care regimen upon admission and as needed Assess ulceration(s) every visit Notes: Electronic Signature(s) Signed: 01/29/2023 2:51:50 PM By: Lawanda Cousins Entered By: Lawanda Cousins on 01/29/2023 13:23:24 -------------------------------------------------------------------------------- Pain Assessment Details Patient Name: Date of Service: PRINTIS, ECKENROD 01/29/2023 12:45 PM Medical Record Number: [id number] Patient Account Number: [id number] Date of Birth/Sex: Treating RN: [birth year] (57 y.o. [name], [place] Primary Care Aleesha Ringstad: Charlott Rakes Other Clinician: Referring Kynzi Levay: Treating Schyler Butikofer/Extender: Sherene Sires Weeks in Treatment: 28 Active Problems Location of Pain Severity and Description of Pain Patient Has Paino No Site Locations [place], [place] ([id number]) [phone].pdf Page 5 of 7 Pain Management and Medication Current Pain Management: Electronic Signature(s) Signed: 01/29/2023 2:51:50 PM By: Lawanda Cousins Entered By: Lawanda Cousins on 01/29/2023  12:47:34 -------------------------------------------------------------------------------- Patient/Caregiver Education Details Patient Name: Date of Service: Matthew IZAYA, SPEARIN 2/26/2024andnbsp12:45 PM Medical Record Number: [id number] Patient Account Number: [id number] Date of Birth/Gender: Treating RN: [birth year] (57 y.o. [name], [place] Primary Care Physician: Charlott Rakes Other Clinician: Referring Physician: Treating Physician/Extender: Sherene Sires Weeks in Treatment: 43 Education Assessment Education Provided To: Patient Education Topics Provided Pressure: Handouts: Pressure Injury: Prevention and Offloading Methods: Explain/Verbal Responses: State content correctly Wound/Skin Impairment: Handouts: Caring for Your Ulcer Methods: Explain/Verbal Responses: State content correctly Electronic Signature(s) Signed: 01/29/2023 2:51:50 PM By: Lawanda Cousins Entered By: Lawanda Cousins on 01/29/2023 13:23:43 Sentell, Truman Hayward ([id number]) [id number] Page 6 of 7 -------------------------------------------------------------------------------- Wound Assessment Details Patient Name: Date of Service: [name], [name] 01/29/2023 12:45 PM Medical Record Number: [id number] Patient Account Number: [id number] Date of Birth/Sex: Treating RN: 10-19-1966 (57 y.o. [name], [place] Primary Care Keyarra Rendall: Charlott Rakes Other Clinician: Referring Jacinto Keil: Treating Albion Weatherholtz/Extender: Sherene Sires Weeks in Treatment: 32 Wound Status Wound Number: 1 Primary Etiology: Diabetic Wound/Ulcer of the Lower Extremity Wound Location: Left, Plantar Foot Wound Status: Open Wounding Event: Gradually Appeared Comorbid History: Type II Diabetes Date Acquired: 12/04/2021 Weeks Of Treatment: 32 Clustered Wound: No Pending Amputation On Presentation Photos Wound Measurements Length: (cm) 5.5 Width: (cm) 5.3 Depth: (cm) 0.7 Area: (cm) 22.89 Volume: (cm)  16.02 % Reduction in Area: 77.2% % Reduction  in Volume: 68.1% Epithelialization: None 4 Tunneling: No 6 Undermining: No Wound Description Classification: Grade 4 Wound Margin: Distinct, outline attached Exudate Amount: Medium Exudate Type: Serosanguineous Exudate Color: red, brown Foul Odor After Cleansing: No Slough/Fibrino Yes Wound Bed Granulation Amount: Medium (34-66%) Exposed Structure Granulation Quality: Pink, Hyper-granulation Fascia Exposed: No Necrotic Amount: None Present (0%) Fat Layer (Subcutaneous Tissue) Exposed: Yes Tendon Exposed: No Muscle Exposed: No Joint Exposed: No Bone Exposed: No Treatment Notes Wound #1 (Foot) Wound Laterality: Plantar, Left [place], [place] ([id number]) [id number] Page 7 of 7 AandD Ointment Discharge Instruction: Apply AandD Ointment as directed Topical Primary Dressing Hydrofera Blue Ready Transfer Foam, 2.5x2.5 (in/in) Discharge Instruction: Apply Hydrofera Blue Ready to wound bed as directed Secondary Dressing ABD Pad 5x9 (in/in) Discharge Instruction: Cover with ABD pad Kerlix 4.5 x 4.1 (in/yd) Discharge Instruction: Apply Kerlix 4.5 x 4.1 (in/yd) as instructed Secured With Ruch H Soft Cloth Surgical T ape ape, 2x2 (in/yd) Compression Wrap Compression Stockings Add-Ons Electronic Signature(s) Signed: 01/29/2023 2:51:50 PM By: Lawanda Cousins Entered By: Lawanda Cousins on 01/29/2023 12:53:05 -------------------------------------------------------------------------------- Vitals Details Patient Name: Date of Service: Matthew [name], [name] 01/29/2023 12:45 PM Medical Record Number: [id number] Patient Account Number: [id number] Date of Birth/Sex: Treating RN: Mar 31, 1966 (57 y.o. [name], [place] Primary Care Deshante Cassell: Charlott Rakes Other Clinician: Referring Teller Wakefield: Treating Aarnav Steagall/Extender: Sherene Sires Weeks in Treatment: 32 Vital  Signs Time Taken: 12:43 Temperature (F): 98.2 Height (in): 70 Pulse (bpm): 86 Weight (lbs): 230 Respiratory Rate (breaths/min): 20 Body Mass Index (BMI): 33 Blood Pressure (mmHg): 119/79 Reference Range: 80 - 120 mg / dl Notes reports glucose this morning 130's Electronic Signature(s) Signed: 01/29/2023 2:51:50 PM By: Lawanda Cousins Entered By: Lawanda Cousins on 01/29/2023 13:02:31

## 2023-01-30 NOTE — Progress Notes (Signed)
Matthew, [name] ([id number]) [id number] Page 1 of 10 Visit Report for 01/29/2023 Chief Complaint Document Details Patient Name: Date of Service: [name], [name] 01/29/2023 12:45 PM Medical Record Number: [id number] Patient Account Number: [id number] Date of Birth/Sex: Treating RN: Mar 29, 1966 (57 y.o. 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 [name], [place] Primary Care Provider: Charlott [name] Other Clinician: Referring Provider: Treating Provider/Extender: [name] Weeks in Treatment: 32 Debridement Performed for Assessment: Wound #1 Left,Plantar Foot Performed By: Physician [name]., PA-C Debridement Type: Debridement Severity of Tissue Pre Debridement: Fat layer exposed Level of Consciousness (Pre-procedure): Awake and Alert Pre-procedure Verification/Time Out Yes - 13:07 Taken: Pain Control: Lidocaine 4% T opical Solution T Area Debrided (L x W): otal 5.5 (cm) x 5.3 (cm) = 29.15 (cm) Tissue and other material debrided: Viable, Non-Viable, Subcutaneous, Biofilm, Fibrin/Exudate Level: Skin/Subcutaneous Tissue Debridement Description: Excisional Instrument: Curette Bleeding: Minimum Hemostasis Achieved: Pressure Response to Treatment: Procedure was tolerated well Level  of Consciousness (Post- Awake and Alert procedure): [name], [name] ([id number]) [id number] Page 2 of 10 Post Debridement Measurements of Total Wound Length: (cm) 5.5 Width: (cm) 5.3 Depth: (cm) 0.7 Volume: (cm) 16.026 Character of Wound/Ulcer Post Debridement: Improved Severity of Tissue Post Debridement: Fat layer exposed Post Procedure Diagnosis Same as Pre-procedure Electronic Signature(s) Signed: 01/29/2023 2:51:50 PM By: [name] Signed: 01/29/2023 5:34:14 PM By: Matthew Keeler PA-C Entered By: [name] on 01/29/2023 13:09:50 -------------------------------------------------------------------------------- HPI Details Patient Name: Date of Service: [name], [name] 01/29/2023 12:45 PM Medical Record Number: [id number] Patient Account Number: [id number] Date of Birth/Sex: Treating RN: [birth year] (57 y.o. [name], [place] Primary Care Provider: Charlott [name] Other Clinician: Referring Provider: Treating Provider/Extender: [name] Weeks in Treatment: 64 History of Present Illness HPI Description: Notes from [facility] under [name] care:   ADMISSION6/15/2023This is a 57 year old poorly controlled type II diabetic (last A1c 11.5%). In January of this year, he presented to the hospital with an abscess in his left foot. He also had a foreign body present. It was removed in the ER and he was admitted to the hospital for IV antibiotics. He was subsequently discharged on oral antibiotics. Due to financial concerns, he has not taken his diabetic medications (metformin and 70/30 insulin) and as a result, presented back to the emergency room on June 1 with worsening findings, including frank pus and osteomyelitis on MRI. BKA was recommended, but he declined. He was taken to the operating room by [name] who performed an extensive debridement. Cultures were taken and he was placed on IV antibiotics. He saw infectious  disease while in the hospital who prescribed a 6-week course of IV antibiotics including cefazolin as well as oral metronidazole and levofloxacin. The infectious disease provider also indicated that he would benefit from below-knee amputation, but the patient desired another opinion and therefore he has been referred to wound care center for further evaluation and management. ABIs obtained while he was in the hospital demonstrate adequate blood flow. Essentially the entire bottom of the  patient's left foot has been removed. The heads of the majority of his metatarsal bones are exposed along with their accompanying tendons. Muscle and fat are exposed as well. The wound surface is fairly dry; the patient reports that he was not [name] any specific wound care instructions other than to place a dry dressing on it after showering. There is no odor or purulent drainage identified. 05/26/2022: The wound measures a little bit smaller today. There is some good granulation tissue beginning to form on the surface. He still has exposed bone and tendon. There is some slough accumulation. He saw infectious disease earlier this week and was frustrated that they recommended amputation. He is interested in another surgical opinion. He continues to smoke but says that he has cut down. He is using a knee scooter for mobility so that he can stay off of his foot. 06/05/2022: He saw [name] last week who was supportive of our efforts to try to salvage the foot. I referred him to vascular surgery to see [name] but he has not received an appointment yet. He continues on IV antibiotics. He says that after our conversation last week about him being unable to initiate hyperbarics if he was going to continue to smoke, he has not had a cigarette since. He is now at 4 weeks of conventional management and his insurance was initiated on July 1. Today, the wound continues to show evidence of improvement. There is good granulation  tissue forming. The metatarsals are still exposed but actually have some pink periosteum. There is still some slough and fibrinous exudate present. No odor or purulent drainage. We have just been using Dakin's dressing changes for now. [facility]: 06-15-2022 upon evaluation today patient presents for initial inspection here in our clinic though he has been seen by Dr. Celine Ahr in [place] up to this point. I did review her notes and records as well they have been using Dakin's moistened gauze dressing which has done a great job at helping to clean out the bottom of his foot. Please see the discourse above for additional information in regard to treatment course up until the patient arrives here in the [place] clinic today. The patient is ready to get started with HBO therapy as soon as possible I do think based on all my review of his records this seems to be appropriate he does have chronic osteomyelitis is also been offered amputation this is obviously a limb salvage operation here. We are trying to prevent him from having to undergo an extensive amputation which would likely be a below-knee amputation which in turn is going to affect his quality of life he wants to try to avoid that if at all possible I think hyperbaric oxygen therapy is his best bet to achieve that goal. Currently he has been on IV Ancef. That actually ends tomorrow. Following that according to notes it appears he supposed to be taking Levaquin and Flagyl for an additional period of time although I am not certain [name], [name] ([id number]) [id number] Page 3 of 10 exactly how long that with the. He has been seeing regional physicians infectious disease in [place] and again that so I recommended that he contact today in order to find out what the plan is going forward. His most recent hemoglobin A1c as noted above was 11.5 on 05-07-2022 he tells me trying to work on that he is  also quit smoking as of this point. 06-22-2022 upon evaluation today patient  appears to be doing well currently in regard to his wound all things considered. He is showing signs of a lot of new granulation tissue and overall very pleased. He is changing this once a day with the Dakin's moistened gauze dressing. Fortunately I do not see any evidence of infection locally or systemically at this time. We have been trying to get him into the hyperbaric chamber he was having a lot of issues with sinus pressure and we have made referral and called multiple ENT specialist the earliest we have been able to get his August 9 at [facility] ear nose throat. They are going to try to work him in sooner if they have any opportunities to do so. Obviously I think the sooner he can do this the better. We need to get him in hyperbarics as quickly as possible. 7/26; patient presents for follow-up. He has been using Dakin's wet-to-dry dressings and oil emollient dressing over the exposed bone. He currently denies systemic signs of infection. He states he is scheduled to see infectious disease, Dr Candiss Norse next week. She had ordered an MRI of the left foot and he is scheduled to have this done tomorrow. He currently denies systemic signs of infection Including fever/chills, nausea/vomiting increased warmth or erythema to the wound bed or purulent drainage. He started hyperbaric oxygen treatment however did not tolerate this due to sinus pressure. He is scheduled to see ENT in 2 weeks for evaluation. He is not continuing HBO until he is cleared by ENT. 07-06-2022 patient presents today for follow-up concerning his plantar foot ulcer. The good news is this actually is significantly better compared to previous. Some of the bone which is necrotic is starting to slough off and I am going to perform debridement to clear this away today. Nonetheless I do think that the patient is going to need hyperbarics and we need to try to get it as  soon as possible. He still having a lot of sinus pressure I can even hear the congestion today. He is on 3 different antibiotics cefadroxil, metronidazole, and Levaquin currently for the osteomyelitis. For that reason this should actually help with the infection if he does have a chronic sinusitis I am also going to see about getting him on steroids to try to see if this can benefit him as well. He voiced understanding and he is in agreement with giving that shot. This is probably can to spike his blood sugars which I discussed with him he is going to be seeing his primary care provider later today I want him to let her know as well what is going on and why so that she could be aware but I really do think he needs to take the prednisone. We need to get him in the chamber as soon as possible to try to help with getting this wound to heal and closed so that he does not lose his foot. 07-13-2022 upon evaluation today patient appears to be doing better in regard to his wound he continues to show signs of new granulation am going to perform some debridement today but overall this is doing quite well. 07-20-2022 upon evaluation today patient's wound is actually showing some signs of improvement he does have 1 area on the fifth metatarsal region where there is some necrotic tendon that is noted at this point. Unfortunately this is preventing good granulation and over this point the rest of the metatarsal region is completely covered over with granulation tissue which is great news  there is some other slough and biofilm buildup that I will clearway as well. 07-27-2022 upon evaluation today patient appears to be doing well currently in regard to his plantar foot ulcer. He has been tolerating the dressing changes without complication fortunately. I do believe he is making excellent progress and each time I see him this is better and better is pretty much completely covered as far as any bone exposure is concerned  and he still is on the IV antibiotics were still also undergoing hyperbarics at this point that he just got started and is doing quite well with since getting the tubes in his ears.Marland Kitchen 08-03-2022 upon evaluation today patient appears to be doing well in general although has been having a lot of drainage compared to normal. It was actually leaking through his dressing today. He tells me he changed it last night. With that being said he has been up on his feet a lot more which I think is probably a big part of the issue here. Fortunately I do not see any signs of infection locally or systemically. 08-17-2022 upon evaluation today patient is doing well with regard to his wound this is actually measuring smaller which is good news. I am very pleased in that regard. With that being said he has not been participating HBO therapy as he really needs to afford to see this heal appropriately and completely. In fact he has only made 9 total HBO treatments in the past 4 weeks. Again this is not therapeutic and I discussed that with the patient. Nonetheless he is still improving and since the hyperbarics is not helping based on the discussion today the patient is going to discontinue HBO therapy at this point. He tells me that getting here 5 days a week just is not possible. He has had difficulty getting here and then also when he was here had difficulty with getting here at the right time which has also been part of the problem. Nonetheless either way I am pleased that he is doing better and my hope is that we will still be able to get him healed obviously he should continue with the antibiotics as well as the wound care as he has been doing this seems to be doing excellent for him but everything is improving quite nicely. 08-24-2022 upon evaluation today patient appears to be doing well currently in regard to his wound this is actually measuring significantly better even compared to last week. I am very pleased in  that regard I do not see any signs of infection and I think he is making excellent progress here. 08-31-2022 upon evaluation today patient's wound is actually showing signs of significant improvement. I am actually very pleased with where things stand currently. There does not appear to be any evidence of active infection locally or systemically at this time. No fevers, chills, nausea, vomiting, or diarrhea. 09-19-2022 upon evaluation patient's wound is actually showing signs of improvement though it still having quite a bit of drainage. I do believe he would benefit from switching over to [facility]. I discussed that with him today. He is definitely in agreement with that plan. 09-28-2022 upon evaluation patient's wound is actually showing signs of improvement. With that being said he has been using the [facility] but he has not had enough to really cover the whole wound. In fact he tells me he just got his supplies today that we had ordered last week. Fortunately I do not see any evidence of active  infection at this time. 10-05-2022 upon evaluation today patient appears to be doing well currently in regard to his foot wound there is actually much less debridement today to be done compared to previous. Fortunately I see no signs of active infection locally or systemically at this time. 11-02-2022 upon evaluation today patient appears to be doing well currently in regard to his wounds on the foot. Fortunately he seems to be making good progress is not quite as much of an improvement this week as it was last time I saw him but he had a lot going on and tells me that he has been up moving on it more. I do believe he needs to try to get this under control and I do believe staying off of it more will definitely help in that regard. 11-09-2022 upon evaluation today patient appears to be doing well currently in regard to his wound. He is showing signs of improvement which is great news. Fortunately I do  not see any evidence of active infection locally nor systemically at this time. No fever or chills noted 11-16-2022 patient's wound still slowly seems to be making progress. The good news is we have gotten the approval for the Apligraf which is awesome. Working to see about getting that started form hopefully will be able to get to him for the end of the year and then subsequently we can see if we can regain the approval for the final 3 in the first of the new year. Nonetheless I think getting this started as quickly as possible would be good for the patient currently. I do believe this is going to have a good result as far as helping with the healing and new tissue growth and epithelization in regard to the wound. He still using the offloading with a knee scooter at this point. 11-23-2022 upon evaluation today patient appears to be doing well currently in regard to his wound he does have a lot of drainage but fortunately no signs of infection at this point. Fortunately there does not appear to be any signs of active infection locally nor systemically at this time which is great news and overall I am extremely pleased with regard to where we stand. No fevers, chills, nausea, vomiting, or diarrhea. 12/28; patient arrived for Apligraf #2 today. The area is large and on the left plantar foot some irregular areas of hypergranulation however most of this looks clean. I think we are changing weekly because of drainage. 12-22-2022 upon evaluation today patient appears to be doing well currently in regard to his wound. He is not showing any signs of active infection which is great news. No fevers, chills, nausea, vomiting, or diarrhea. 01-09-2023 upon evaluation today patient's foot actually appears to be doing well I have not seen him since 19 January but he has made some progress. The [place], TALLON ([id number]) [id number] Page 4 of 10 only downside is unfortunately his scooter has  broken his hand order a piece to her. It is on the way. As soon as it arrives he will get back on that which will definitely help him significantly. 01-16-2023 upon evaluation today patient appears to be doing well currently in regard to his wound. Its about the same it does not look like there is any breakdown or worsening but it also does not look like it significantly smaller either. Fortunately I do not see any evidence of infection at this time. He does tell me that he got a new cable for his  knee scooter but he has not gotten it fixed yet. He just got that today he tells me. He also of note has been up helping his brother to paint because "I was bored". 01-23-2023 upon evaluation today patient's wound is actually showing signs of improvement already which is great news. He just got a scooter going last week after I saw him and he has done much better with the offloading as soon as he got the knee scooter back in action. In general I am very pleased with where we stand currently. I do not see any signs of active infection locally nor systemically which is great news. 01-29-2023 upon evaluation today patient appears to be doing well currently in regard to his wound. Has been tolerating the dressing changes without complication. Fortunately I do not see any evidence of active infection locally nor systemically which is great news. Electronic Signature(s) Signed: 01/29/2023 1:20:08 PM By: Matthew Keeler PA-C Entered By: [name] on 01/29/2023 13:20:08 -------------------------------------------------------------------------------- Physical Exam Details Patient Name: Date of Service: [name], [name] 01/29/2023 12:45 PM Medical Record Number: [id number] Patient Account Number: [id number] Date of Birth/Sex: Treating RN: [birth year] (57 y.o. [name], [place] Primary Care Provider: Charlott [name] Other Clinician: Referring Provider: Treating Provider/Extender: [name] Weeks in  Treatment: 91 Constitutional Well-nourished and well-hydrated in no acute distress. Respiratory normal breathing without difficulty. Psychiatric this patient is able to make decisions and demonstrates good insight into disease process. Alert and Oriented x 3. pleasant and cooperative. Notes Upon inspection patient's wound bed actually showed signs of good granulation epithelization there was some evidence of biofilm noted on the surface of the wound I was able to remove this biofilm and fibrin from the surface of the wound down to the subcutaneous tissue he tolerated this without complication postdebridement this is significantly improved. Electronic Signature(s) Signed: 01/29/2023 1:20:37 PM By: Matthew Keeler PA-C Entered By: [name] on 01/29/2023 13:20:36 -------------------------------------------------------------------------------- Physician Orders Details Patient Name: Date of Service: [name], [name] 01/29/2023 12:45 PM [name] ([id number]) [id number] Page 5 of 10 Medical Record Number: [id number] Patient Account Number: [id number] Date of Birth/Sex: Treating RN: 1966-07-27 (57 y.o. [name], [place] Primary Care Provider: Charlott [name] Other Clinician: Referring Provider: Treating Provider/Extender: [name] Weeks in Treatment: 41 Verbal / Phone Orders: No Diagnosis Coding Follow-up Appointments Return Appointment in 1 week. Anesthetic (Use 'Patient Medications' Section for Anesthetic Order Entry) Lidocaine applied to wound bed Edema Control - Lymphedema / Segmental Compressive Device / Other Elevate, Exercise Daily and A void Standing for Long Periods of Time. Elevate legs to the level of the heart and pump ankles as often as possible Elevate leg(s) parallel to the floor when sitting. Off-Loading Other: - knee scooter Wound Treatment Wound #1 - Foot Wound Laterality: Plantar, Left Peri-Wound Care: AandD Ointment  3 x Per Week/30 Days Discharge Instructions: Apply AandD Ointment as directed Prim Dressing: Hydrofera Blue Ready Transfer Foam, 2.5x2.5 (in/in) 3 x Per Week/30 Days ary Discharge Instructions: Apply Hydrofera Blue Ready to wound bed as directed Secondary Dressing: ABD Pad 5x9 (in/in) (Generic) 3 x Per Week/30 Days Discharge Instructions: Cover with ABD pad Secondary Dressing: Kerlix 4.5 x 4.1 (in/yd) (Generic) 3 x Per Week/30 Days Discharge Instructions: Apply Kerlix 4.5 x 4.1 (in/yd) as instructed Secured With: Medipore T - 13M Medipore H Soft Cloth Surgical T ape ape, 2x2 (in/yd) (Generic) 3 x Per Week/30 Days Electronic Signature(s) Signed: 01/29/2023 2:51:50 PM By: [name] Signed:  01/29/2023 5:34:14 PM By: Matthew Keeler PA-C Entered By: [name] on 01/29/2023 13:10:03 -------------------------------------------------------------------------------- Problem List Details Patient Name: Date of Service: [name], [name] 01/29/2023 12:45 PM Medical Record Number: [id number] Patient Account Number: [id number] Date of Birth/Sex: Treating RN: 05-19-1966 (57 y.o. [name], [place] Primary Care Provider: Charlott [name] Other Clinician: Referring Provider: Treating Provider/Extender: [name] Weeks in Treatment: 28 Active Problems ICD-10 Encounter Code Description Active Date MDM Diagnosis [phone] Other chronic osteomyelitis, left ankle and foot 06/15/2022 No Yes SIWICKI, Jaamal ([id number]) [id number] Page 6 of 10 E11.621 Type 2 diabetes mellitus with foot ulcer 06/15/2022 No Yes L97.524 Non-pressure chronic ulcer of other part of left foot with necrosis of bone 06/15/2022 No Yes Inactive Problems Resolved Problems Electronic Signature(s) Signed: 01/29/2023 1:06:02 PM By: Matthew Keeler PA-C Entered By: [name] on 01/29/2023 13:06:01 -------------------------------------------------------------------------------- Progress Note  Details Patient Name: Date of Service: [name], [name] 01/29/2023 12:45 PM Medical Record Number: [id number] Patient Account Number: [id number] Date of Birth/Sex: Treating RN: Apr 09, 1966 (57 y.o. 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 In January of this year, he presented to the hospital with an abscess in his left foot. He also had a foreign body present. It was removed in the ER and he was admitted to the hospital for IV antibiotics. He was subsequently discharged on oral antibiotics. Due to financial concerns, he has not taken his diabetic medications (metformin and 70/30 insulin) and as a result, presented back to the emergency room on June 1 with worsening findings, including frank pus and osteomyelitis on MRI. BKA was recommended, but he declined. He was taken to the operating room by [name] who performed an extensive debridement. Cultures were taken and he was placed on IV antibiotics. He saw infectious disease while in the hospital who prescribed a 6-week course of IV antibiotics including cefazolin as well as oral metronidazole and levofloxacin. The infectious disease provider also indicated that he would benefit from below-knee amputation, but the patient desired another opinion and therefore he has been referred to wound care center for further evaluation and management. ABIs obtained while he was in the hospital demonstrate adequate blood flow. Essentially the entire bottom of the patient's left foot has been removed. The heads of  the majority of his metatarsal bones are exposed along with their accompanying tendons. Muscle and fat are exposed as well. The wound surface is fairly dry; the patient reports that he was not [name] any specific wound care instructions other than to place a dry dressing on it after showering. There is no odor or purulent drainage identified. 05/26/2022: The wound measures a little bit smaller today. There is some good granulation tissue beginning to form on the surface. He still has exposed bone and tendon. There is some slough accumulation. He saw infectious disease earlier this week and was frustrated that they recommended amputation. He is interested in another surgical opinion. He continues to smoke but says that he has cut down. He is using a knee scooter for mobility so that he can stay off of his foot. 06/05/2022: He saw [name] last week who was  supportive of our efforts to try to salvage the foot. I referred him to vascular surgery to see [name] but he has not received an appointment yet. He continues on IV antibiotics. He says that after our conversation last week about him being unable to initiate hyperbarics if he was going to continue to smoke, he has not had a cigarette since. He is now at 4 weeks of conventional management and his insurance was initiated on July 1. Today, the wound continues to show evidence of improvement. There is good granulation tissue forming. The metatarsals are still exposed but actually have some pink periosteum. There is still some slough and fibrinous exudate present. No odor or purulent drainage. We have just been using Dakin's dressing changes for now. Admission to [name] [facility]: [name], [name] ([id number]) [id number] Page 7 of 10 06-15-2022 upon evaluation today patient presents for initial inspection here in our clinic though he has been seen by Dr. Celine Ahr in [place] up to this point. I did review her notes and  records as well they have been using Dakin's moistened gauze dressing which has done a great job at helping to clean out the bottom of his foot. Please see the discourse above for additional information in regard to treatment course up until the patient arrives here in the [place] clinic today. The patient is ready to get started with HBO therapy as soon as possible I do think based on all my review of his records this seems to be appropriate he does have chronic osteomyelitis is also been offered amputation this is obviously a limb salvage operation here. We are trying to prevent him from having to undergo an extensive amputation which would likely be a below-knee amputation which in turn is going to affect his quality of life he wants to try to avoid that if at all possible I think hyperbaric oxygen therapy is his best bet to achieve that goal. Currently he has been on IV Ancef. That actually ends tomorrow. Following that according to notes it appears he supposed to be taking Levaquin and Flagyl for an additional period of time although I am not certain exactly how long that with the. He has been seeing regional physicians infectious disease in [place] and again that so I recommended that he contact today in order to find out what the plan is going forward. His most recent hemoglobin A1c as noted above was 11.5 on 05-07-2022 he tells me trying to work on that he is also quit smoking as of this point. 06-22-2022 upon evaluation today patient appears to be doing well currently in regard to his wound all things considered. He is showing signs of a lot of new granulation tissue and overall very pleased. He is changing this once a day with the Dakin's moistened gauze dressing. Fortunately I do not see any evidence of infection locally or systemically at this time. We have been trying to get him into the hyperbaric chamber he was having a lot of issues with sinus pressure and we have made referral and  called multiple ENT specialist the earliest we have been able to get his August 9 at [facility] ear nose throat. They are going to try to work him in sooner if they have any opportunities to do so. Obviously I think the sooner he can do this the better. We need to get him in hyperbarics as quickly as possible. 7/26; patient presents for follow-up. He has been using Dakin's wet-to-dry dressings and oil  emollient dressing over the exposed bone. He currently denies systemic signs of infection. He states he is scheduled to see infectious disease, Dr Candiss Norse next week. She had ordered an MRI of the left foot and he is scheduled to have this done tomorrow. He currently denies systemic signs of infection Including fever/chills, nausea/vomiting increased warmth or erythema to the wound bed or purulent drainage. He started hyperbaric oxygen treatment however did not tolerate this due to sinus pressure. He is scheduled to see ENT in 2 weeks for evaluation. He is not continuing HBO until he is cleared by ENT. 07-06-2022 patient presents today for follow-up concerning his plantar foot ulcer. The good news is this actually is significantly better compared to previous. Some of the bone which is necrotic is starting to slough off and I am going to perform debridement to clear this away today. Nonetheless I do think that the patient is going to need hyperbarics and we need to try to get it as soon as possible. He still having a lot of sinus pressure I can even hear the congestion today. He is on 3 different antibiotics cefadroxil, metronidazole, and Levaquin currently for the osteomyelitis. For that reason this should actually help with the infection if he does have a chronic sinusitis I am also going to see about getting him on steroids to try to see if this can benefit him as well. He voiced understanding and he is in agreement with giving that shot. This is probably can to spike his blood sugars which I discussed with him  he is going to be seeing his primary care provider later today I want him to let her know as well what is going on and why so that she could be aware but I really do think he needs to take the prednisone. We need to get him in the chamber as soon as possible to try to help with getting this wound to heal and closed so that he does not lose his foot. 07-13-2022 upon evaluation today patient appears to be doing better in regard to his wound he continues to show signs of new granulation am going to perform some debridement today but overall this is doing quite well. 07-20-2022 upon evaluation today patient's wound is actually showing some signs of improvement he does have 1 area on the fifth metatarsal region where there is some necrotic tendon that is noted at this point. Unfortunately this is preventing good granulation and over this point the rest of the metatarsal region is completely covered over with granulation tissue which is great news there is some other slough and biofilm buildup that I will clearway as well. 07-27-2022 upon evaluation today patient appears to be doing well currently in regard to his plantar foot ulcer. He has been tolerating the dressing changes without complication fortunately. I do believe he is making excellent progress and each time I see him this is better and better is pretty much completely covered as far as any bone exposure is concerned and he still is on the IV antibiotics were still also undergoing hyperbarics at this point that he just got started and is doing quite well with since getting the tubes in his ears.Marland Kitchen 08-03-2022 upon evaluation today patient appears to be doing well in general although has been having a lot of drainage compared to normal. It was actually leaking through his dressing today. He tells me he changed it last night. With that being said he has been up on his feet  a lot more which I think is probably a big part of the issue here. Fortunately I do  not see any signs of infection locally or systemically. 08-17-2022 upon evaluation today patient is doing well with regard to his wound this is actually measuring smaller which is good news. I am very pleased in that regard. With that being said he has not been participating HBO therapy as he really needs to afford to see this heal appropriately and completely. In fact he has only made 9 total HBO treatments in the past 4 weeks. Again this is not therapeutic and I discussed that with the patient. Nonetheless he is still improving and since the hyperbarics is not helping based on the discussion today the patient is going to discontinue HBO therapy at this point. He tells me that getting here 5 days a week just is not possible. He has had difficulty getting here and then also when he was here had difficulty with getting here at the right time which has also been part of the problem. Nonetheless either way I am pleased that he is doing better and my hope is that we will still be able to get him healed obviously he should continue with the antibiotics as well as the wound care as he has been doing this seems to be doing excellent for him but everything is improving quite nicely. 08-24-2022 upon evaluation today patient appears to be doing well currently in regard to his wound this is actually measuring significantly better even compared to last week. I am very pleased in that regard I do not see any signs of infection and I think he is making excellent progress here. 08-31-2022 upon evaluation today patient's wound is actually showing signs of significant improvement. I am actually very pleased with where things stand currently. There does not appear to be any evidence of active infection locally or systemically at this time. No fevers, chills, nausea, vomiting, or diarrhea. 09-19-2022 upon evaluation patient's wound is actually showing signs of improvement though it still having quite a bit of drainage. I do  believe he would benefit from switching over to [facility]. I discussed that with him today. He is definitely in agreement with that plan. 09-28-2022 upon evaluation patient's wound is actually showing signs of improvement. With that being said he has been using the [facility] but he has not had enough to really cover the whole wound. In fact he tells me he just got his supplies today that we had ordered last week. Fortunately I do not see any evidence of active infection at this time. 10-05-2022 upon evaluation today patient appears to be doing well currently in regard to his foot wound there is actually much less debridement today to be done compared to previous. Fortunately I see no signs of active infection locally or systemically at this time. 11-02-2022 upon evaluation today patient appears to be doing well currently in regard to his wounds on the foot. Fortunately he seems to be making good progress is not quite as much of an improvement this week as it was last time I saw him but he had a lot going on and tells me that he has been up moving on it more. I do believe he needs to try to get this under control and I do believe staying off of it more will definitely help in that regard. 11-09-2022 upon evaluation today patient appears to be doing well currently in regard to his wound. He is showing  signs of improvement which is great news. Fortunately I do not see any evidence of active infection locally nor systemically at this time. No fever or chills noted 11-16-2022 patient's wound still slowly seems to be making progress. The good news is we have gotten the approval for the Apligraf which is awesome. Working to see about getting that started form hopefully will be able to get to him for the end of the year and then subsequently we can see if we can regain the approval for the final 3 in the first of the new year. Nonetheless I think getting this started as quickly as possible would be  good for the patient currently. I do believe this is going to have a good result as far as helping with the healing and new tissue growth and epithelization in regard to the wound. He still using the offloading with a knee scooter at this point. 11-23-2022 upon evaluation today patient appears to be doing well currently in regard to his wound he does have a lot of drainage but fortunately no signs of infection at this point. Fortunately there does not appear to be any signs of active infection locally nor systemically at this time which is great news and overall I am extremely pleased with regard to where we stand. No fevers, chills, nausea, vomiting, or diarrhea. [name], [name] ([id number]) [id number] Page 8 of 10 12/28; patient arrived for Apligraf #2 today. The area is large and on the left plantar foot some irregular areas of hypergranulation however most of this looks clean. I think we are changing weekly because of drainage. 12-22-2022 upon evaluation today patient appears to be doing well currently in regard to his wound. He is not showing any signs of active infection which is great news. No fevers, chills, nausea, vomiting, or diarrhea. 01-09-2023 upon evaluation today patient's foot actually appears to be doing well I have not seen him since 19 January but he has made some progress. The only downside is unfortunately his scooter has broken his hand order a piece to her. It is on the way. As soon as it arrives he will get back on that which will definitely help him significantly. 01-16-2023 upon evaluation today patient appears to be doing well currently in regard to his wound. Its about the same it does not look like there is any breakdown or worsening but it also does not look like it significantly smaller either. Fortunately I do not see any evidence of infection at this time. He does tell me that he got a new cable for his knee scooter but he has not gotten it fixed  yet. He just got that today he tells me. He also of note has been up helping his brother to paint because "I was bored". 01-23-2023 upon evaluation today patient's wound is actually showing signs of improvement already which is great news. He just got a scooter going last week after I saw him and he has done much better with the offloading as soon as he got the knee scooter back in action. In general I am very pleased with where we stand currently. I do not see any signs of active infection locally nor systemically which is great news. 01-29-2023 upon evaluation today patient appears to be doing well currently in regard to his wound. Has been tolerating the dressing changes without complication. Fortunately I do not see any evidence of active infection locally nor systemically which is great news. Objective Constitutional Well-nourished and well-hydrated in no  acute distress. Vitals Time Taken: 12:43 PM, Height: 70 in, Weight: 230 lbs, BMI: 33, Temperature: 98.2 F, Pulse: 86 bpm, Respiratory Rate: 20 breaths/min, Blood Pressure: 119/79 mmHg. General Notes: reports glucose this morning 130's Respiratory normal breathing without difficulty. Psychiatric this patient is able to make decisions and demonstrates good insight into disease process. Alert and Oriented x 3. pleasant and cooperative. General Notes: Upon inspection patient's wound bed actually showed signs of good granulation epithelization there was some evidence of biofilm noted on the surface of the wound I was able to remove this biofilm and fibrin from the surface of the wound down to the subcutaneous tissue he tolerated this without complication postdebridement this is significantly improved. Integumentary (Hair, Skin) Wound #1 status is Open. Original cause of wound was Gradually Appeared. The date acquired was: 12/04/2021. The wound has been in treatment 32 weeks. The wound is located on the [place]. The wound measures 5.5cm  length x 5.3cm width x 0.7cm depth; 22.894cm^2 area and 16.026cm^3 volume. There is Fat Layer (Subcutaneous Tissue) exposed. There is no tunneling or undermining noted. There is a medium amount of serosanguineous drainage noted. The wound margin is distinct with the outline attached to the wound base. There is medium (34-66%) pink, hyper - granulation within the wound bed. There is no necrotic tissue within the wound bed. Assessment Active Problems ICD-10 Other chronic osteomyelitis, left ankle and foot Type 2 diabetes mellitus with foot ulcer Non-pressure chronic ulcer of other part of left foot with necrosis of bone Procedures Wound #1 Pre-procedure diagnosis of Wound #1 is a Diabetic Wound/Ulcer of the Lower Extremity located on the [place] .Severity of Tissue Pre Debridement is: Fat layer exposed. There was a Excisional Skin/Subcutaneous Tissue Debridement with a total area of 29.15 sq cm performed by [name]., PA-C. With the following instrument(s): Curette to remove Viable and Non-Viable tissue/material. Material removed includes Subcutaneous Tissue, Biofilm, and Fibrin/Exudate after achieving pain control using Lidocaine 4% Topical Solution. No specimens were taken. A time out was conducted at 13:07, prior to the start of the procedure. A Minimum amount of bleeding was controlled with Pressure. The procedure was tolerated well. Post Debridement Measurements: 5.5cm length x 5.3cm width x 0.7cm depth; 16.026cm^3 volume. Character of Wound/Ulcer Post Debridement is improved. Severity of Tissue Post Debridement is: Fat layer exposed. Post procedure Diagnosis Wound #1: Same as Pre-Procedure RODDIE, SPOSATO ([id number]) [id number] Page 9 of 10 Plan Follow-up Appointments: Return Appointment in 1 week. Anesthetic (Use 'Patient Medications' Section for Anesthetic Order Entry): Lidocaine applied to wound bed Edema Control - Lymphedema / Segmental  Compressive Device / Other: Elevate, Exercise Daily and Avoid Standing for Long Periods of Time. Elevate legs to the level of the heart and pump ankles as often as possible Elevate leg(s) parallel to the floor when sitting. Off-Loading: Other: - knee scooter WOUND #1: - Foot Wound Laterality: Plantar, Left Peri-Wound Care: AandD Ointment 3 x Per Week/30 Days Discharge Instructions: Apply AandD Ointment as directed Prim Dressing: Hydrofera Blue Ready Transfer Foam, 2.5x2.5 (in/in) 3 x Per Week/30 Days ary Discharge Instructions: Apply Hydrofera Blue Ready to wound bed as directed Secondary Dressing: ABD Pad 5x9 (in/in) (Generic) 3 x Per Week/30 Days Discharge Instructions: Cover with ABD pad Secondary Dressing: Kerlix 4.5 x 4.1 (in/yd) (Generic) 3 x Per Week/30 Days Discharge Instructions: Apply Kerlix 4.5 x 4.1 (in/yd) as instructed Secured With: Medipore T - 15M Medipore H Soft Cloth Surgical T ape ape, 2x2 (in/yd) (Generic) 3 x  Per Week/30 Days 1. I am good recommend that we have the patient continue to monitor for any signs of infection or worsening. Based on what I am seeing I do believe that he is really doing quite well. 2. Also can recommend that the patient should continue to offload is much as he can he is using his knee scooter I can tell by the way the wound looks and is doing a good job in this regard. 3. I am also can recommend that he should continue to change this dressing 3 times per week I think that is doing well at this point. We will see patient back for reevaluation in 1 week here in the clinic. If anything worsens or changes patient will contact our office for additional recommendations. Electronic Signature(s) Signed: 01/29/2023 1:20:58 PM By: Matthew Keeler PA-C Entered By: [name] on 01/29/2023 13:20:58 -------------------------------------------------------------------------------- SuperBill Details Patient Name: Date of Service: Matthew SALEEM, FUESS  01/29/2023 Medical Record Number: [id number] Patient Account Number: [id number] Date of Birth/Sex: Treating RN: Jun 04, 1966 (57 y.o. [name], [place] Primary Care Provider: Charlott [name] Other Clinician: Referring Provider: Treating Provider/Extender: [name] Weeks in Treatment: 32 Diagnosis Coding ICD-10 Codes Code Description [phone] Other chronic osteomyelitis, left ankle and foot E11.621 Type 2 diabetes mellitus with foot ulcer L97.524 Non-pressure chronic ulcer of other part of left foot with necrosis of bone Facility Procedures : ANDEN, BROUILLETTE Code: [id number] EE ([id number]) I Description: [id number] - DEB SUBQ TISSUE 20 SQ CM/< [id number] CD-10 Diagnosis Description L97.524 Non-pressure chronic ulcer of other part of left foot with necrosis of bone Modifier: [id number] Page Quantity: 1 10 of 10 : CPT4 Code: [id number] 11 I Description: 045 - DEB SUBQ TISS EA ADDL 20CM CD-10 Diagnosis Description L97.524 Non-pressure chronic ulcer of other part of left foot with necrosis of bone Modifier: 1 Quantity: Physician Procedures : CPT4 Code Description Modifier [id number] - WC PHYS SUBQ TISS 20 SQ CM ICD-10 Diagnosis Description L97.524 Non-pressure chronic ulcer of other part of left foot with necrosis of bone Quantity: 1 : [id number] - WC PHYS SUBQ TISS EA ADDL 20 CM ICD-10 Diagnosis Description L97.524 Non-pressure chronic ulcer of other part of left foot with necrosis of bone Quantity: 1 Electronic Signature(s) Signed: 01/29/2023 1:32:05 PM By: Matthew Keeler PA-C Entered By: [name] on 01/29/2023 13:32:05

## 2023-02-05 ENCOUNTER — Other Ambulatory Visit: Payer: Self-pay

## 2023-02-05 ENCOUNTER — Ambulatory Visit: Payer: 59 | Admitting: Internal Medicine

## 2023-02-06 ENCOUNTER — Encounter: Payer: 59 | Attending: Internal Medicine | Admitting: Internal Medicine

## 2023-02-06 DIAGNOSIS — M86672 Other chronic osteomyelitis, left ankle and foot: Secondary | ICD-10-CM | POA: Diagnosis present

## 2023-02-06 DIAGNOSIS — F17200 Nicotine dependence, unspecified, uncomplicated: Secondary | ICD-10-CM | POA: Insufficient documentation

## 2023-02-06 DIAGNOSIS — L97524 Non-pressure chronic ulcer of other part of left foot with necrosis of bone: Secondary | ICD-10-CM | POA: Insufficient documentation

## 2023-02-06 DIAGNOSIS — E11621 Type 2 diabetes mellitus with foot ulcer: Secondary | ICD-10-CM | POA: Insufficient documentation

## 2023-02-09 ENCOUNTER — Other Ambulatory Visit: Payer: Self-pay

## 2023-02-10 NOTE — Progress Notes (Signed)
[name] ([id number]) [id number] Page 1 of 9 Visit Report for 02/06/2023 Arrival Information Details Patient Name: Date of Service: [name], [name] 02/06/2023 3:00 PM Medical Record Number: [id number] Patient Account Number: [id number] Date of Birth/Sex: Treating RN: 03-12-1966 (57 y.o. Clide Dales Primary Care Emrah Ariola: Charlott Rakes Other Clinician: Referring Caelan Atchley: Treating Kaylamarie Swickard/Extender: RO BSO N, MICHA EL Lonn [place], Enobong Weeks in Treatment: 46 Visit Information History Since Last Visit Added or deleted any medications: No Patient Arrived: Knee Scooter Any new allergies or adverse reactions: No Arrival Time: 14:59 Had a fall or experienced change in No Accompanied By: self activities of daily living that may affect Transfer Assistance: None risk of falls: Patient Identification Verified: Yes Signs or symptoms of abuse/neglect since last visito No Secondary Verification Process Completed: Yes Hospitalized since last visit: No Patient Requires Transmission-Based Precautions: No Has Dressing in Place as Prescribed: Yes Patient Has Alerts: No Pain Present Now: No Electronic Signature(s) Signed: 02/08/2023 4:59:51 PM By: Levora Dredge Entered By: Levora Dredge on 02/06/2023 15:00:31 -------------------------------------------------------------------------------- Clinic Level of Care Assessment Details Patient Name: Date of Service: [name] 02/06/2023 3:00 PM Medical Record Number: [id number] Patient Account Number: [id number] Date of Birth/Sex: Treating RN: [birth year] (57 y.o. Clide Dales Primary Care Wanda Cellucci: Charlott Rakes Other Clinician: Referring Layna Roeper: Treating Gabryel Talamo/Extender: RO BSO N, MICHA EL Lonn [place], Enobong Weeks in Treatment: 33 Clinic Level of Care Assessment Items TOOL 4 Quantity Score X- 1 0 Use when only an EandM is performed on FOLLOW-UP visit ASSESSMENTS - Nursing Assessment / Reassessment X-  1 10 Reassessment of Co-morbidities (includes updates in patient status) X- 1 5 Reassessment of Adherence to Treatment Plan ASSESSMENTS - Wound and Skin A ssessment / Reassessment X - Simple Wound Assessment / Reassessment - one wound 1 5 [place], [place] ([id number] Page 2 of 9 '[]'$  - 0 Complex Wound Assessment / Reassessment - multiple wounds '[]'$  - 0 Dermatologic / Skin Assessment (not related to wound area) ASSESSMENTS - Focused Assessment '[]'$  - 0 Circumferential Edema Measurements - multi extremities '[]'$  - 0 Nutritional Assessment / Counseling / Intervention X- 1 5 Lower Extremity Assessment (monofilament, tuning fork, pulses) '[]'$  - 0 Peripheral Arterial Disease Assessment (using hand held doppler) ASSESSMENTS - Ostomy and/or Continence Assessment and Care '[]'$  - 0 Incontinence Assessment and Management '[]'$  - 0 Ostomy Care Assessment and Management (repouching, etc.) PROCESS - Coordination of Care X - Simple Patient / Family Education for ongoing care 1 15 '[]'$  - 0 Complex (extensive) Patient / Family Education for ongoing care '[]'$  - 0 Staff obtains Programmer, systems, Records, T Results / Process Orders est '[]'$  - 0 Staff telephones HHA, Nursing Homes / Clarify orders / etc '[]'$  - 0 Routine Transfer to another Facility (non-emergent condition) '[]'$  - 0 Routine Hospital Admission (non-emergent condition) '[]'$  - 0 New Admissions / Biomedical engineer / Ordering NPWT Apligraf, etc. , '[]'$  - 0 Emergency Hospital Admission (emergent condition) X- 1 10 Simple Discharge Coordination '[]'$  - 0 Complex (extensive) Discharge Coordination PROCESS - Special Needs '[]'$  - 0 Pediatric / Minor Patient Management '[]'$  - 0 Isolation Patient Management '[]'$  - 0 Hearing / Language / Visual special needs '[]'$  - 0 Assessment of Community assistance (transportation, D/C planning, etc.) '[]'$  - 0 Additional assistance / Altered mentation '[]'$  - 0 Support Surface(s) Assessment (bed,  cushion, seat, etc.) INTERVENTIONS - Wound Cleansing / Measurement X - Simple Wound Cleansing - one wound 1 5 '[]'$  - 0 Complex Wound Cleansing - multiple wounds X- 1  5 Wound Imaging (photographs - any number of wounds) '[]'$  - 0 Wound Tracing (instead of photographs) X- 1 5 Simple Wound Measurement - one wound '[]'$  - 0 Complex Wound Measurement - multiple wounds INTERVENTIONS - Wound Dressings X - Small Wound Dressing one or multiple wounds 1 10 '[]'$  - 0 Medium Wound Dressing one or multiple wounds '[]'$  - 0 Large Wound Dressing one or multiple wounds '[]'$  - 0 Application of Medications - topical '[]'$  - 0 Application of Medications - injection INTERVENTIONS - Miscellaneous '[]'$  - 0 External ear exam '[]'$  - 0 Specimen Collection (cultures, biopsies, blood, body fluids, etc.) '[]'$  - 0 Specimen(s) / Culture(s) sent or taken to Lab for analysis [name], [name] ([id number]) [id number] Page 3 of 9 '[]'$  - 0 Patient Transfer (multiple staff / Civil Service fast streamer / Similar devices) '[]'$  - 0 Simple Staple / Suture removal (25 or less) '[]'$  - 0 Complex Staple / Suture removal (26 or more) '[]'$  - 0 Hypo / Hyperglycemic Management (close monitor of Blood Glucose) '[]'$  - 0 Ankle / Brachial Index (ABI) - do not check if billed separately X- 1 5 Vital Signs Has the patient been seen at the hospital within the last three years: Yes Total Score: 80 Level Of Care: New/Established - Level 3 Electronic Signature(s) Signed: 02/08/2023 4:59:51 PM By: Levora Dredge Entered By: Levora Dredge on 02/06/2023 15:22:26 -------------------------------------------------------------------------------- Encounter Discharge Information Details Patient Name: Date of Service: [name], [name] 02/06/2023 3:00 PM Medical Record Number: [id number] Patient Account Number: [id number] Date of Birth/Sex: Treating RN: 11-14-1966 (57 y.o. Clide Dales Primary Care Ezrah Dembeck: Charlott Rakes Other Clinician: Referring  Dayleen Beske: Treating Bayne Fosnaugh/Extender: RO BSO N, MICHA EL Lonn [place], Enobong Weeks in Treatment: 37 Encounter Discharge Information Items Discharge Condition: Stable Ambulatory Status: Knee Scooter Discharge Destination: Home Transportation: Private Auto Accompanied By: self Schedule Follow-up Appointment: Yes Clinical Summary of Care: Electronic Signature(s) Signed: 02/08/2023 4:59:51 PM By: Levora Dredge Entered By: Levora Dredge on 02/06/2023 15:24:40 -------------------------------------------------------------------------------- Lower Extremity Assessment Details Patient Name: Date of Service: [name], [name] 02/06/2023 3:00 PM Medical Record Number: [id number] Patient Account Number: [id number] Date of Birth/Sex: Treating RN: 08/23/1966 (57 y.o. Clide Dales Primary Care Euretha Najarro: Charlott Rakes Other Clinician: ZYERE, ZUK ([id number]) [id number] Page 4 of 9 Referring Javonnie Illescas: Treating Kaylyn Garrow/Extender: RO BSO N, MICHA EL G Newlin, Enobong Weeks in Treatment: 33 Vascular Assessment Pulses: Dorsalis Pedis Palpable: [Left:Yes] Posterior Tibial Palpable: [Left:Yes] Electronic Signature(s) Signed: 02/08/2023 4:59:51 PM By: Levora Dredge Entered By: Levora Dredge on 02/06/2023 15:09:43 -------------------------------------------------------------------------------- Multi Wound Chart Details Patient Name: Date of Service: [name], [name] 02/06/2023 3:00 PM Medical Record Number: [id number] Patient Account Number: [id number] Date of Birth/Sex: Treating RN: [birth year] (57 y.o. Clide Dales Primary Care Rondall Radigan: Charlott Rakes Other Clinician: Referring Keyaria Lawson: Treating Shelma Eiben/Extender: RO BSO N, MICHA EL G Newlin, Enobong Weeks in Treatment: 33 Vital Signs Height(in): 70 Pulse(bpm): 82 Weight(lbs): 230 Blood Pressure(mmHg): 156/88 Body Mass Index(BMI): 33 Temperature(F): 97.8 Respiratory Rate(breaths/min): 18 [1:Photos:]  [N/A:N/A] Left, Plantar Foot N/A N/A Wound Location: Gradually Appeared N/A N/A Wounding Event: Diabetic Wound/Ulcer of the Lower N/A N/A Primary Etiology: Extremity Type II Diabetes N/A N/A Comorbid History: 12/04/2021 N/A N/A Date Acquired: 78 N/A N/A Weeks of Treatment: Open N/A N/A Wound Status: No N/A N/A Wound Recurrence: Yes N/A N/A Pending A mputation on Presentation: 5x5.6x0.5 N/A N/A Measurements L x W x D (cm) 21.991 N/A N/A A (cm) : rea 10.996 N/A N/A Volume (cm) : 78.10% N/A N/A % Reduction in A  rea: 78.10% N/A N/A % Reduction in Volume: Grade 4 N/A N/A Classification: Medium N/A N/A Exudate A mount: Serosanguineous N/A N/A Exudate Type: red, brown N/A N/A Exudate Color: Kercher, [place] ([id number] Page 5 of 9 Distinct, outline attached N/A N/A Wound Margin: Large (67-100%) N/A N/A Granulation Amount: Pink, Hyper-granulation N/A N/A Granulation Quality: None Present (0%) N/A N/A Necrotic Amount: Fat Layer (Subcutaneous Tissue): Yes N/A N/A Exposed Structures: Fascia: No Tendon: No Muscle: No Joint: No Bone: No Medium (34-66%) N/A N/A Epithelialization: Treatment Notes Electronic Signature(s) Signed: 02/08/2023 4:59:51 PM By: Levora Dredge Entered By: Levora Dredge on 02/06/2023 15:14:13 -------------------------------------------------------------------------------- Multi-Disciplinary Care Plan Details Patient Name: Date of Service: [name], [name] 02/06/2023 3:00 PM Medical Record Number: [id number] Patient Account Number: [id number] Date of Birth/Sex: Treating RN: 08/24/1966 (57 y.o. Clide Dales Primary Care Captola Teschner: Charlott Rakes Other Clinician: Referring Lyric Hoar: Treating Andron Marrazzo/Extender: RO BSO N, MICHA EL G Newlin, Enobong Weeks in Treatment: 33 Active Inactive Wound/Skin Impairment Nursing Diagnoses: Knowledge deficit related to ulceration/compromised skin  integrity Goals: Patient/caregiver will verbalize understanding of skin care regimen Date Initiated: 06/15/2022 Target Resolution Date: 04/27/2023 Goal Status: Active Ulcer/skin breakdown will have a volume reduction of 30% by week 4 Date Initiated: 06/15/2022 Date Inactivated: 08/03/2022 Target Resolution Date: 07/16/2022 Goal Status: Unmet Unmet Reason: comorbities Ulcer/skin breakdown will have a volume reduction of 50% by week 8 Date Initiated: 06/15/2022 Date Inactivated: 08/31/2022 Target Resolution Date: 08/16/2022 Goal Status: Unmet Unmet Reason: comorbdities Ulcer/skin breakdown will have a volume reduction of 80% by week 12 Date Initiated: 06/15/2022 Date Inactivated: 11/16/2022 Target Resolution Date: 09/15/2022 Goal Status: Unmet Unmet Reason: comorbidities Ulcer/skin breakdown will heal within 14 weeks Date Initiated: 06/15/2022 Date Inactivated: 11/16/2022 Target Resolution Date: 10/16/2022 Goal Status: Unmet Unmet Reason: comorbidities Interventions: Assess patient/caregiver ability to obtain necessary supplies Assess patient/caregiver ability to perform ulcer/skin care regimen upon admission and as needed Assess ulceration(s) every visit Notes: Electronic Signature(s) [place], [place] ([id number]) [id number] Page 6 of 9 Signed: 02/08/2023 4:59:51 PM By: Levora Dredge Entered By: Levora Dredge on 02/06/2023 15:23:34 -------------------------------------------------------------------------------- Pain Assessment Details Patient Name: Date of Service: [name], [name] 02/06/2023 3:00 PM Medical Record Number: [id number] Patient Account Number: [id number] Date of Birth/Sex: Treating RN: [birth year] (57 y.o. Clide Dales Primary Care Alynn Ellithorpe: Charlott Rakes Other Clinician: Referring Marzell Isakson: Treating Ileene Allie/Extender: RO BSO N, MICHA EL Lonn [place], Enobong Weeks in Treatment: 33 Active Problems Location of Pain Severity and Description of  Pain Patient Has Paino No Site Locations Rate the pain. Current Pain Level: 0 Pain Management and Medication Current Pain Management: Electronic Signature(s) Signed: 02/08/2023 4:59:51 PM By: Levora Dredge Entered By: Levora Dredge on 02/06/2023 15:02:21 -------------------------------------------------------------------------------- Patient/Caregiver Education Details Patient Name: Date of Service: Matthew Jaymes Graff 3/5/2024andnbsp3:00 PM Medical Record Number: [id number] Patient Account Number: [id number] [name], [name] ([id number]) [id number] Page 7 of 9 Date of Birth/Gender: Treating RN: 1966-06-27 (57 y.o. Clide Dales Primary Care Physician: Charlott Rakes Other Clinician: Referring Physician: Treating Physician/Extender: RO BSO N, MICHA EL Lonn [place], Enobong Weeks in Treatment: 11 Education Assessment Education Provided To: Patient Education Topics Provided Offloading: Handouts: How Offloading Helps Foot Wounds Heal Methods: Explain/Verbal Responses: State content correctly Pressure: Handouts: Pressure Injury: Prevention and Offloading Methods: Explain/Verbal Responses: State content correctly Wound/Skin Impairment: Handouts: Caring for Your Ulcer Methods: Explain/Verbal Responses: State content correctly Electronic Signature(s) Signed: 02/08/2023 4:59:51 PM By: Levora Dredge Entered By: Levora Dredge on 02/06/2023 15:24:03 -------------------------------------------------------------------------------- Wound Assessment Details Patient Name: Date of Service: [name], [name] 02/06/2023  3:00 PM Medical Record Number: [id number] Patient Account Number: [id number] Date of Birth/Sex: Treating RN: 1966-10-31 (57 y.o. Clide Dales Primary Care Shalika Arntz: Charlott Rakes Other Clinician: Referring Faria Casella: Treating Anshika Pethtel/Extender: RO BSO N, MICHA EL G Newlin, Enobong Weeks in Treatment: 33 Wound Status Wound Number: 1 Primary  Etiology: Diabetic Wound/Ulcer of the Lower Extremity Wound Location: Left, Plantar Foot Wound Status: Open Wounding Event: Gradually Appeared Comorbid History: Type II Diabetes Date Acquired: 12/04/2021 Weeks Of Treatment: 33 Clustered Wound: No Pending Amputation On Presentation [place], [place] ([id number]) [id number] Page 8 of 9 Wound Measurements Length: (cm) 5 Width: (cm) 5.6 Depth: (cm) 0.5 Area: (cm) 21.991 Volume: (cm) 10.996 % Reduction in Area: 78.1% % Reduction in Volume: 78.1% Epithelialization: Medium (34-66%) Tunneling: No Undermining: No Wound Description Classification: Grade 4 Wound Margin: Distinct, outline attached Exudate Amount: Medium Exudate Type: Serosanguineous Exudate Color: red, brown Foul Odor After Cleansing: No Slough/Fibrino No Wound Bed Granulation Amount: Large (67-100%) Exposed Structure Granulation Quality: Pink, Hyper-granulation Fascia Exposed: No Necrotic Amount: None Present (0%) Fat Layer (Subcutaneous Tissue) Exposed: Yes Tendon Exposed: No Muscle Exposed: No Joint Exposed: No Bone Exposed: No Treatment Notes Wound #1 (Foot) Wound Laterality: Plantar, Left Cleanser Peri-Wound Care AandD Ointment Discharge Instruction: Apply AandD Ointment as directed Topical Primary Dressing Hydrofera Blue Ready Transfer Foam, 2.5x2.5 (in/in) Discharge Instruction: Apply Hydrofera Blue Ready to wound bed as directed Secondary Dressing ABD Pad 5x9 (in/in) Discharge Instruction: Cover with ABD pad Secured With Medipore T - 86M Medipore H Soft Cloth Surgical T ape ape, 2x2 (in/yd) Compression Wrap Compression Stockings Add-Ons Electronic Signature(s) Signed: 02/08/2023 4:59:51 PM By: Levora Dredge Entered By: Levora Dredge on 02/06/2023 15:09:23 [name] ([id number]) [id number] Page 9 of  9 -------------------------------------------------------------------------------- Vitals Details Patient Name: Date of Service: [name], [name] 02/06/2023 3:00 PM Medical Record Number: [id number] Patient Account Number: [id number] Date of Birth/Sex: Treating RN: 1966-07-22 (57 y.o. Clide Dales Primary Care Orvie Caradine: Charlott Rakes Other Clinician: Referring Jermya Dowding: Treating Carmel Waddington/Extender: RO BSO N, MICHA EL G Newlin, Enobong Weeks in Treatment: 33 Vital Signs Time Taken: 15:01 Temperature (F): 97.8 Height (in): 70 Pulse (bpm): 82 Weight (lbs): 230 Respiratory Rate (breaths/min): 18 Body Mass Index (BMI): 33 Blood Pressure (mmHg): 156/88 Reference Range: 80 - 120 mg / dl Electronic Signature(s) Signed: 02/08/2023 4:59:51 PM By: Levora Dredge Entered By: Levora Dredge on 02/06/2023 15:02:12

## 2023-02-10 NOTE — Progress Notes (Addendum)
ITZEL, SCHILLING ([id number]) [id number] Page 1 of 7 Visit Report for 02/06/2023 HPI Details Patient Name: Date of Service: [name] FLAVIL, MCMORRAN 02/06/2023 3:00 PM Medical Record Number: [id number] Patient Account Number: [id number] Date of Birth/Sex: Treating RN: Oct 10, 1966 (57 y.o. [name] Primary Care Provider: Charlott Rakes Other Clinician: Referring Provider: Treating Provider/Extender: RO BSO N, MICHA EL G Newlin, Enobong Weeks in Treatment: 33 History of Present Illness HPI Description: Notes from [facility] under Dr. Glenford Peers care:   ADMISSION6/15/2023This is a 44 year old poorly controlled type II diabetic (last A1c 11.5%). In January of this year, he presented to the hospital with an abscess in his left foot. He also had a foreign body present. It was removed in the ER and he was admitted to the hospital for IV antibiotics. He was subsequently discharged on oral antibiotics. Due to financial concerns, he has not taken his diabetic medications (metformin and 70/30 insulin) and as a result, presented back to the emergency room on June 1 with worsening findings, including frank pus and osteomyelitis on MRI. BKA was recommended, but he declined. He was taken to the operating room by Dr. Sharol Given who performed an extensive debridement. Cultures were taken and he was placed on IV antibiotics. He saw infectious disease while in the hospital who prescribed a 6-week course of IV antibiotics including cefazolin as well as oral metronidazole and levofloxacin. The infectious disease provider also indicated that he would benefit from below-knee amputation, but the patient desired another opinion and therefore he has been referred to wound care center for further evaluation and management. ABIs obtained while he was in the hospital demonstrate adequate blood flow. Essentially the entire bottom of the patient's left foot has been removed. The heads of the  majority of his metatarsal bones are exposed along with their accompanying tendons. Muscle and fat are exposed as well. The wound surface is fairly dry; the patient reports that he was not given any specific wound care instructions other than to place a dry dressing on it after showering. There is no odor or purulent drainage identified. 05/26/2022: The wound measures a little bit smaller today. There is some good granulation tissue beginning to form on the surface. He still has exposed bone and tendon. There is some slough accumulation. He saw infectious disease earlier this week and was frustrated that they recommended amputation. He is interested in another surgical opinion. He continues to smoke but says that he has cut down. He is using a knee scooter for mobility so that he can stay off of his foot. 06/05/2022: He saw Dr. Sharol Given last week who was supportive of our efforts to try to salvage the foot. I referred him to vascular surgery to see Dr. Carlis Abbott but he has not received an appointment yet. He continues on IV antibiotics. He says that after our conversation last week about him being unable to initiate hyperbarics if he was going to continue to smoke, he has not had a cigarette since. He is now at 4 weeks of conventional management and his insurance was initiated on July 1. Today, the wound continues to show evidence of improvement. There is good granulation tissue forming. The metatarsals are still exposed but actually have some pink periosteum. There is still some slough and fibrinous exudate present. No odor or purulent drainage. We have just been using Dakin's dressing changes for now. Admission to [place] at [facility]: 06-15-2022 upon evaluation today patient presents for initial inspection here in our clinic though  he has been seen by Dr. Celine Ahr in [place] up to this point. I did review her notes and records as well they have been using Dakin's moistened gauze dressing which has done a  great job at helping to clean out the bottom of his foot. Please see the discourse above for additional information in regard to treatment course up until the patient arrives here in the [place] clinic today. The patient is ready to get started with HBO therapy as soon as possible I do think based on all my review of his records this seems to be appropriate he does have chronic osteomyelitis is also been offered amputation this is obviously a limb salvage operation here. We are trying to prevent him from having to undergo an extensive amputation which would likely be a below-knee amputation which in turn is going to affect his quality of life he wants to try to avoid that if at all possible I think hyperbaric oxygen therapy is his best bet to achieve that goal. Currently he has been on IV Ancef. That actually ends tomorrow. Following that according to notes it appears he supposed to be taking Levaquin and Flagyl for an additional period of time although I am not certain exactly how long that with the. He has been seeing regional physicians infectious disease in [place] and again that so I recommended that he contact today in order to find out what the plan is going forward. His most recent hemoglobin A1c as noted above was 11.5 on 05-07-2022 he tells me trying to work on that he is also quit smoking as of this point. 06-22-2022 upon evaluation today patient appears to be doing well currently in regard to his wound all things considered. He is showing signs of a lot of new granulation tissue and overall very pleased. He is changing this once a day with the Dakin's moistened gauze dressing. Fortunately I do not see any evidence of infection locally or systemically at this time. We have been trying to get him into the hyperbaric chamber he was having a lot of issues with sinus pressure and we have made referral and called multiple ENT specialist the earliest we have been able to get his August 9 at  [facility] ear nose throat. They are going to try to work him in sooner if they have any opportunities to do so. Obviously I think the sooner he can do this the better. We need to get him in hyperbarics as quickly as possible. 7/26; patient presents for follow-up. He has been using Dakin's wet-to-dry dressings and oil emollient dressing over the exposed bone. He currently denies systemic signs of infection. He states he is scheduled to see infectious disease, Dr Candiss Norse next week. She had ordered an MRI of the left foot and he is scheduled to have this done tomorrow. He currently denies systemic signs of infection Including fever/chills, nausea/vomiting increased warmth or erythema to the wound bed or purulent drainage. He started hyperbaric oxygen treatment however did not tolerate this due to sinus pressure. He is scheduled to see ENT in 2 weeks for evaluation. He is not continuing HBO until he is cleared by ENT. 07-06-2022 patient presents today for follow-up concerning his plantar foot ulcer. The good news is this actually is significantly better compared to previous. Some of the bone which is necrotic is starting to slough off and I am going to perform debridement to clear this away today. Nonetheless I do think that the patient is going to need  hyperbarics and we need to try to get it as soon as possible. He still having a lot of sinus pressure I can even hear the congestion today. He is on 3 different antibiotics cefadroxil, metronidazole, and Levaquin currently for the osteomyelitis. For that reason this should actually help with the infection if he does have a chronic sinusitis I am also going to see about getting him on steroids to try to see if this can benefit him as well. He voiced understanding and he is in agreement with giving that shot. This is probably can to spike his blood sugars which I discussed with him he is going to be seeing his primary care provider later today I want him to let  her know as well what is going on and why so that she could be aware but I really do think he needs to take the prednisone. We need to get him in the chamber as soon as possible to try to help with getting this wound to heal and closed so that he does not lose his foot. 07-13-2022 upon evaluation today patient appears to be doing better in regard to his wound he continues to show signs of new granulation am going to perform some debridement today but overall this is doing quite well. 07-20-2022 upon evaluation today patient's wound is actually showing some signs of improvement he does have 1 area on the fifth metatarsal region where there is some necrotic tendon that is noted at this point. Unfortunately this is preventing good granulation and over this point the rest of the metatarsal region [place], [place] ([id number]) [id number] Page 2 of 7 is completely covered over with granulation tissue which is great news there is some other slough and biofilm buildup that I will clearway as well. 07-27-2022 upon evaluation today patient appears to be doing well currently in regard to his plantar foot ulcer. He has been tolerating the dressing changes without complication fortunately. I do believe he is making excellent progress and each time I see him this is better and better is pretty much completely covered as far as any bone exposure is concerned and he still is on the IV antibiotics were still also undergoing hyperbarics at this point that he just got started and is doing quite well with since getting the tubes in his ears.Marland Kitchen 08-03-2022 upon evaluation today patient appears to be doing well in general although has been having a lot of drainage compared to normal. It was actually leaking through his dressing today. He tells me he changed it last night. With that being said he has been up on his feet a lot more which I think is probably a big part of the issue here. Fortunately I do not  see any signs of infection locally or systemically. 08-17-2022 upon evaluation today patient is doing well with regard to his wound this is actually measuring smaller which is good news. I am very pleased in that regard. With that being said he has not been participating HBO therapy as he really needs to afford to see this heal appropriately and completely. In fact he has only made 9 total HBO treatments in the past 4 weeks. Again this is not therapeutic and I discussed that with the patient. Nonetheless he is still improving and since the hyperbarics is not helping based on the discussion today the patient is going to discontinue HBO therapy at this point. He tells me that getting here 5 days a week just is not possible. He  has had difficulty getting here and then also when he was here had difficulty with getting here at the right time which has also been part of the problem. Nonetheless either way I am pleased that he is doing better and my hope is that we will still be able to get him healed obviously he should continue with the antibiotics as well as the wound care as he has been doing this seems to be doing excellent for him but everything is improving quite nicely. 08-24-2022 upon evaluation today patient appears to be doing well currently in regard to his wound this is actually measuring significantly better even compared to last week. I am very pleased in that regard I do not see any signs of infection and I think he is making excellent progress here. 08-31-2022 upon evaluation today patient's wound is actually showing signs of significant improvement. I am actually very pleased with where things stand currently. There does not appear to be any evidence of active infection locally or systemically at this time. No fevers, chills, nausea, vomiting, or diarrhea. 09-19-2022 upon evaluation patient's wound is actually showing signs of improvement though it still having quite a bit of drainage. I do  believe he would benefit from switching over to [facility]. I discussed that with him today. He is definitely in agreement with that plan. 09-28-2022 upon evaluation patient's wound is actually showing signs of improvement. With that being said he has been using the [facility] but he has not had enough to really cover the whole wound. In fact he tells me he just got his supplies today that we had ordered last week. Fortunately I do not see any evidence of active infection at this time. 10-05-2022 upon evaluation today patient appears to be doing well currently in regard to his foot wound there is actually much less debridement today to be done compared to previous. Fortunately I see no signs of active infection locally or systemically at this time. 11-02-2022 upon evaluation today patient appears to be doing well currently in regard to his wounds on the foot. Fortunately he seems to be making good progress is not quite as much of an improvement this week as it was last time I saw him but he had a lot going on and tells me that he has been up moving on it more. I do believe he needs to try to get this under control and I do believe staying off of it more will definitely help in that regard. 11-09-2022 upon evaluation today patient appears to be doing well currently in regard to his wound. He is showing signs of improvement which is great news. Fortunately I do not see any evidence of active infection locally nor systemically at this time. No fever or chills noted 11-16-2022 patient's wound still slowly seems to be making progress. The good news is we have gotten the approval for the Apligraf which is awesome. Working to see about getting that started form hopefully will be able to get to him for the end of the year and then subsequently we can see if we can regain the approval for the final 3 in the first of the new year. Nonetheless I think getting this started as quickly as possible would be  good for the patient currently. I do believe this is going to have a good result as far as helping with the healing and new tissue growth and epithelization in regard to the wound. He still using the offloading with  a knee scooter at this point. 11-23-2022 upon evaluation today patient appears to be doing well currently in regard to his wound he does have a lot of drainage but fortunately no signs of infection at this point. Fortunately there does not appear to be any signs of active infection locally nor systemically at this time which is great news and overall I am extremely pleased with regard to where we stand. No fevers, chills, nausea, vomiting, or diarrhea. 12/28; patient arrived for Apligraf #2 today. The area is large and on the left plantar foot some irregular areas of hypergranulation however most of this looks clean. I think we are changing weekly because of drainage. 12-22-2022 upon evaluation today patient appears to be doing well currently in regard to his wound. He is not showing any signs of active infection which is great news. No fevers, chills, nausea, vomiting, or diarrhea. 01-09-2023 upon evaluation today patient's foot actually appears to be doing well I have not seen him since 19 January but he has made some progress. The only downside is unfortunately his scooter has broken his hand order a piece to her. It is on the way. As soon as it arrives he will get back on that which will definitely help him significantly. 01-16-2023 upon evaluation today patient appears to be doing well currently in regard to his wound. Its about the same it does not look like there is any breakdown or worsening but it also does not look like it significantly smaller either. Fortunately I do not see any evidence of infection at this time. He does tell me that he got a new cable for his knee scooter but he has not gotten it fixed yet. He just got that today he tells me. He also of note has been up helping  his brother to paint because "I was bored". 01-23-2023 upon evaluation today patient's wound is actually showing signs of improvement already which is great news. He just got a scooter going last week after I saw him and he has done much better with the offloading as soon as he got the knee scooter back in action. In general I am very pleased with where we stand currently. I do not see any signs of active infection locally nor systemically which is great news. 01-29-2023 upon evaluation today patient appears to be doing well currently in regard to his wound. Has been tolerating the dressing changes without complication. Fortunately I do not see any evidence of active infection locally nor systemically which is great news. 3/5; Lt planter foot. Type 2 dm. Using a Publishing copy. Electronic Signature(s) Signed: 02/26/2023 6:24:37 PM By: Linton Ham MD Entered By: Linton Ham on 02/13/2023 17:03:46 -------------------------------------------------------------------------------- Physical Exam Details Patient Name: Date of Service: [place] SRITHAN, BLEAZARD 02/06/2023 3:00 PM Medical Record Number: [id number] Patient Account Number: [id number] GRIEZMANN, KULCZYK ([id number]) [id number] Page 3 of 7 Date of Birth/Sex: Treating RN: [birth year] (57 y.o. [name] Primary Care Provider: Other Clinician: Charlott Rakes Referring Provider: Treating Provider/Extender: RO BSO N, MICHA EL Lonn [place], Enobong Weeks in Treatment: 23 Constitutional Patient is hypertensive.. Pulse regular and within target range for patient.Marland Kitchen Respirations regular, non-labored and within target range.. Temperature is normal and within the target range for the patient.Marland Kitchen appears in no distress. Notes Wound Exam; good granulation. no sighs of Infection. Electronic Signature(s) Signed: 02/26/2023 6:24:37 PM By: Linton Ham MD Entered By: Linton Ham on 02/13/2023  17:06:40 -------------------------------------------------------------------------------- Physician Orders Details Patient Name: Date of Service: [place] Jaymes Graff 02/06/2023  3:00 PM Medical Record Number: [id number] Patient Account Number: [id number] Date of Birth/Sex: Treating RN: [birth year] (56 y.o. [name] Primary Care Provider: Charlott Rakes Other Clinician: Referring Provider: Treating Provider/Extender: RO BSO N, MICHA EL G Newlin, Enobong Weeks in Treatment: 86 Verbal / Phone Orders: No Diagnosis Coding Follow-up Appointments Return Appointment in 1 week. Anesthetic (Use 'Patient Medications' Section for Anesthetic Order Entry) Lidocaine applied to wound bed Edema Control - Lymphedema / Segmental Compressive Device / Other Elevate, Exercise Daily and A void Standing for Long Periods of Time. Elevate legs to the level of the heart and pump ankles as often as possible Elevate leg(s) parallel to the floor when sitting. Off-Loading Other: - knee scooter Wound Treatment Wound #1 - Foot Wound Laterality: Plantar, Left Peri-Wound Care: AandD Ointment 3 x Per Week/30 Days Discharge Instructions: Apply AandD Ointment as directed Prim Dressing: Hydrofera Blue Ready Transfer Foam, 2.5x2.5 (in/in) 3 x Per Week/30 Days ary Discharge Instructions: Apply Hydrofera Blue Ready to wound bed as directed Secondary Dressing: ABD Pad 5x9 (in/in) (Generic) 3 x Per Week/30 Days Discharge Instructions: Cover with ABD pad Secured With: Medipore T - 65M Medipore H Soft Cloth Surgical T ape ape, 2x2 (in/yd) (Generic) 3 x Per Week/30 Days Electronic Signature(s) Signed: 02/06/2023 4:03:34 PM By: Linton Ham MD Signed: 02/08/2023 4:59:51 PM By: Levora Dredge Entered By: Levora Dredge on 02/06/2023 15:21:35 -------------------------------------------------------------------------------- Progress Note Details Patient Name: Date of Service: [place] HOYTE, VOISINE 02/06/2023 3:00 PM Medical Record  Number: [id number] Patient Account Number: [id number] Date of Birth/Sex: Treating RN: May 07, 1966 (57 y.o. [name] Primary Care Provider: Charlott Rakes Other Clinician: JERME, ARRISON ([id number]) [id number] Page 4 of 7 Referring Provider: Treating Provider/Extender: RO BSO N, MICHA EL G Newlin, Enobong Weeks in Treatment: 33 Subjective History of Present Illness (HPI) Notes from [facility] under Dr. Glenford Peers care:   ADMISSION6/15/2023This is a 62 year old poorly controlled type II diabetic (last A1c 11.5%). In January of this year, he presented to the hospital with an abscess in his left foot. He also had a foreign body present. It was removed in the ER and he was admitted to the hospital for IV antibiotics. He was subsequently discharged on oral antibiotics. Due to financial concerns, he has not taken his diabetic medications (metformin and 70/30 insulin) and as a result, presented back to the emergency room on June 1 with worsening findings, including frank pus and osteomyelitis on MRI. BKA was recommended, but he declined. He was taken to the operating room by Dr. Sharol Given who performed an extensive debridement. Cultures were taken and he was placed on IV antibiotics. He saw infectious disease while in the hospital who prescribed a 6-week course of IV antibiotics including cefazolin as well as oral metronidazole and levofloxacin. The infectious disease provider also indicated that he would benefit from below-knee amputation, but the patient desired another opinion and therefore he has been referred to wound care center for further evaluation and management. ABIs obtained while he was in the hospital demonstrate adequate blood flow. Essentially the entire bottom of the patient's left foot has been removed. The heads of the majority of his metatarsal bones are exposed along with their accompanying tendons. Muscle and fat are exposed as well.  The wound surface is fairly dry; the patient reports that he was not given any specific wound care instructions other than to place a dry dressing on it after showering. There is no odor or purulent drainage identified. 05/26/2022: The wound measures a  little bit smaller today. There is some good granulation tissue beginning to form on the surface. He still has exposed bone and tendon. There is some slough accumulation. He saw infectious disease earlier this week and was frustrated that they recommended amputation. He is interested in another surgical opinion. He continues to smoke but says that he has cut down. He is using a knee scooter for mobility so that he can stay off of his foot. 06/05/2022: He saw Dr. Sharol Given last week who was supportive of our efforts to try to salvage the foot. I referred him to vascular surgery to see Dr. Carlis Abbott but he has not received an appointment yet. He continues on IV antibiotics. He says that after our conversation last week about him being unable to initiate hyperbarics if he was going to continue to smoke, he has not had a cigarette since. He is now at 4 weeks of conventional management and his insurance was initiated on July 1. Today, the wound continues to show evidence of improvement. There is good granulation tissue forming. The metatarsals are still exposed but actually have some pink periosteum. There is still some slough and fibrinous exudate present. No odor or purulent drainage. We have just been using Dakin's dressing changes for now. Admission to [place] at [facility]: 06-15-2022 upon evaluation today patient presents for initial inspection here in our clinic though he has been seen by Dr. Celine Ahr in [place] up to this point. I did review her notes and records as well they have been using Dakin's moistened gauze dressing which has done a great job at helping to clean out the bottom of his foot. Please see the discourse above for additional information in  regard to treatment course up until the patient arrives here in the [place] clinic today. The patient is ready to get started with HBO therapy as soon as possible I do think based on all my review of his records this seems to be appropriate he does have chronic osteomyelitis is also been offered amputation this is obviously a limb salvage operation here. We are trying to prevent him from having to undergo an extensive amputation which would likely be a below-knee amputation which in turn is going to affect his quality of life he wants to try to avoid that if at all possible I think hyperbaric oxygen therapy is his best bet to achieve that goal. Currently he has been on IV Ancef. That actually ends tomorrow. Following that according to notes it appears he supposed to be taking Levaquin and Flagyl for an additional period of time although I am not certain exactly how long that with the. He has been seeing regional physicians infectious disease in [place] and again that so I recommended that he contact today in order to find out what the plan is going forward. His most recent hemoglobin A1c as noted above was 11.5 on 05-07-2022 he tells me trying to work on that he is also quit smoking as of this point. 06-22-2022 upon evaluation today patient appears to be doing well currently in regard to his wound all things considered. He is showing signs of a lot of new granulation tissue and overall very pleased. He is changing this once a day with the Dakin's moistened gauze dressing. Fortunately I do not see any evidence of infection locally or systemically at this time. We have been trying to get him into the hyperbaric chamber he was having a lot of issues with sinus pressure and we have  made referral and called multiple ENT specialist the earliest we have been able to get his August 9 at [facility] ear nose throat. They are going to try to work him in sooner if they have any opportunities to do so. Obviously I  think the sooner he can do this the better. We need to get him in hyperbarics as quickly as possible. 7/26; patient presents for follow-up. He has been using Dakin's wet-to-dry dressings and oil emollient dressing over the exposed bone. He currently denies systemic signs of infection. He states he is scheduled to see infectious disease, Dr Candiss Norse next week. She had ordered an MRI of the left foot and he is scheduled to have this done tomorrow. He currently denies systemic signs of infection Including fever/chills, nausea/vomiting increased warmth or erythema to the wound bed or purulent drainage. He started hyperbaric oxygen treatment however did not tolerate this due to sinus pressure. He is scheduled to see ENT in 2 weeks for evaluation. He is not continuing HBO until he is cleared by ENT. 07-06-2022 patient presents today for follow-up concerning his plantar foot ulcer. The good news is this actually is significantly better compared to previous. Some of the bone which is necrotic is starting to slough off and I am going to perform debridement to clear this away today. Nonetheless I do think that the patient is going to need hyperbarics and we need to try to get it as soon as possible. He still having a lot of sinus pressure I can even hear the congestion today. He is on 3 different antibiotics cefadroxil, metronidazole, and Levaquin currently for the osteomyelitis. For that reason this should actually help with the infection if he does have a chronic sinusitis I am also going to see about getting him on steroids to try to see if this can benefit him as well. He voiced understanding and he is in agreement with giving that shot. This is probably can to spike his blood sugars which I discussed with him he is going to be seeing his primary care provider later today I want him to let her know as well what is going on and why so that she could be aware but I really do think he needs to take the prednisone.  We need to get him in the chamber as soon as possible to try to help with getting this wound to heal and closed so that he does not lose his foot. 07-13-2022 upon evaluation today patient appears to be doing better in regard to his wound he continues to show signs of new granulation am going to perform some debridement today but overall this is doing quite well. 07-20-2022 upon evaluation today patient's wound is actually showing some signs of improvement he does have 1 area on the fifth metatarsal region where there is some necrotic tendon that is noted at this point. Unfortunately this is preventing good granulation and over this point the rest of the metatarsal region is completely covered over with granulation tissue which is great news there is some other slough and biofilm buildup that I will clearway as well. 07-27-2022 upon evaluation today patient appears to be doing well currently in regard to his plantar foot ulcer. He has been tolerating the dressing changes without complication fortunately. I do believe he is making excellent progress and each time I see him this is better and better is pretty much completely covered as far as any bone exposure is concerned and he still is on the  IV antibiotics were still also undergoing hyperbarics at this point that he just got started and is doing quite well with since getting the tubes in his ears.Marland Kitchen 08-03-2022 upon evaluation today patient appears to be doing well in general although has been having a lot of drainage compared to normal. It was actually leaking through his dressing today. He tells me he changed it last night. With that being said he has been up on his feet a lot more which I think is probably a big part of the issue here. Fortunately I do not see any signs of infection locally or systemically. 08-17-2022 upon evaluation today patient is doing well with regard to his wound this is actually measuring smaller which is good news. I am very  pleased in that regard. With that being said he has not been participating HBO therapy as he really needs to afford to see this heal appropriately and completely. In fact he EMMERT, NICHTER ([id number]) [id number] Page 5 of 7 has only made 9 total HBO treatments in the past 4 weeks. Again this is not therapeutic and I discussed that with the patient. Nonetheless he is still improving and since the hyperbarics is not helping based on the discussion today the patient is going to discontinue HBO therapy at this point. He tells me that getting here 5 days a week just is not possible. He has had difficulty getting here and then also when he was here had difficulty with getting here at the right time which has also been part of the problem. Nonetheless either way I am pleased that he is doing better and my hope is that we will still be able to get him healed obviously he should continue with the antibiotics as well as the wound care as he has been doing this seems to be doing excellent for him but everything is improving quite nicely. 08-24-2022 upon evaluation today patient appears to be doing well currently in regard to his wound this is actually measuring significantly better even compared to last week. I am very pleased in that regard I do not see any signs of infection and I think he is making excellent progress here. 08-31-2022 upon evaluation today patient's wound is actually showing signs of significant improvement. I am actually very pleased with where things stand currently. There does not appear to be any evidence of active infection locally or systemically at this time. No fevers, chills, nausea, vomiting, or diarrhea. 09-19-2022 upon evaluation patient's wound is actually showing signs of improvement though it still having quite a bit of drainage. I do believe he would benefit from switching over to [facility]. I discussed that with him today. He is definitely in  agreement with that plan. 09-28-2022 upon evaluation patient's wound is actually showing signs of improvement. With that being said he has been using the [facility] but he has not had enough to really cover the whole wound. In fact he tells me he just got his supplies today that we had ordered last week. Fortunately I do not see any evidence of active infection at this time. 10-05-2022 upon evaluation today patient appears to be doing well currently in regard to his foot wound there is actually much less debridement today to be done compared to previous. Fortunately I see no signs of active infection locally or systemically at this time. 11-02-2022 upon evaluation today patient appears to be doing well currently in regard to his wounds on the foot. Fortunately he seems to be  making good progress is not quite as much of an improvement this week as it was last time I saw him but he had a lot going on and tells me that he has been up moving on it more. I do believe he needs to try to get this under control and I do believe staying off of it more will definitely help in that regard. 11-09-2022 upon evaluation today patient appears to be doing well currently in regard to his wound. He is showing signs of improvement which is great news. Fortunately I do not see any evidence of active infection locally nor systemically at this time. No fever or chills noted 11-16-2022 patient's wound still slowly seems to be making progress. The good news is we have gotten the approval for the Apligraf which is awesome. Working to see about getting that started form hopefully will be able to get to him for the end of the year and then subsequently we can see if we can regain the approval for the final 3 in the first of the new year. Nonetheless I think getting this started as quickly as possible would be good for the patient currently. I do believe this is going to have a good result as far as helping with the healing and  new tissue growth and epithelization in regard to the wound. He still using the offloading with a knee scooter at this point. 11-23-2022 upon evaluation today patient appears to be doing well currently in regard to his wound he does have a lot of drainage but fortunately no signs of infection at this point. Fortunately there does not appear to be any signs of active infection locally nor systemically at this time which is great news and overall I am extremely pleased with regard to where we stand. No fevers, chills, nausea, vomiting, or diarrhea. 12/28; patient arrived for Apligraf #2 today. The area is large and on the left plantar foot some irregular areas of hypergranulation however most of this looks clean. I think we are changing weekly because of drainage. 12-22-2022 upon evaluation today patient appears to be doing well currently in regard to his wound. He is not showing any signs of active infection which is great news. No fevers, chills, nausea, vomiting, or diarrhea. 01-09-2023 upon evaluation today patient's foot actually appears to be doing well I have not seen him since 19 January but he has made some progress. The only downside is unfortunately his scooter has broken his hand order a piece to her. It is on the way. As soon as it arrives he will get back on that which will definitely help him significantly. 01-16-2023 upon evaluation today patient appears to be doing well currently in regard to his wound. Its about the same it does not look like there is any breakdown or worsening but it also does not look like it significantly smaller either. Fortunately I do not see any evidence of infection at this time. He does tell me that he got a new cable for his knee scooter but he has not gotten it fixed yet. He just got that today he tells me. He also of note has been up helping his brother to paint because "I was bored". 01-23-2023 upon evaluation today patient's wound is actually showing signs of  improvement already which is great news. He just got a scooter going last week after I saw him and he has done much better with the offloading as soon as he got the knee scooter  back in action. In general I am very pleased with where we stand currently. I do not see any signs of active infection locally nor systemically which is great news. 01-29-2023 upon evaluation today patient appears to be doing well currently in regard to his wound. Has been tolerating the dressing changes without complication. Fortunately I do not see any evidence of active infection locally nor systemically which is great news. 3/5; Lt planter foot. Type 2 dm. Using a Publishing copy. Objective Constitutional Patient is hypertensive.. Pulse regular and within target range for patient.Marland Kitchen Respirations regular, non-labored and within target range.. Temperature is normal and within the target range for the patient.Marland Kitchen appears in no distress. Vitals Time Taken: 3:01 PM, Height: 70 in, Weight: 230 lbs, BMI: 33, Temperature: 97.8 F, Pulse: 82 bpm, Respiratory Rate: 18 breaths/min, Blood Pressure: 156/88 mmHg. General Notes: Wound Exam; good granulation. no sighs of Infection. Integumentary (Hair, Skin) Wound #1 status is Open. Original cause of wound was Gradually Appeared. The date acquired was: 12/04/2021. The wound has been in treatment 33 weeks. The wound is located on the [place]. The wound measures 5cm length x 5.6cm width x 0.5cm depth; 21.991cm^2 area and 10.996cm^3 volume. There is Fat Layer (Subcutaneous Tissue) exposed. There is no tunneling or undermining noted. There is a medium amount of serosanguineous drainage noted. The wound margin is distinct with the outline attached to the wound base. There is large (67-100%) pink, hyper - granulation within the wound bed. There is no necrotic tissue within the wound bed. CLESSON, LAWVER ([id number]) [id number] Page 6 of 7 Plan Follow-up  Appointments: Return Appointment in 1 week. Anesthetic (Use 'Patient Medications' Section for Anesthetic Order Entry): Lidocaine applied to wound bed Edema Control - Lymphedema / Segmental Compressive Device / Other: Elevate, Exercise Daily and Avoid Standing for Long Periods of Time. Elevate legs to the level of the heart and pump ankles as often as possible Elevate leg(s) parallel to the floor when sitting. Off-Loading: Other: - knee scooter WOUND #1: - Foot Wound Laterality: Plantar, Left Peri-Wound Care: AandD Ointment 3 x Per Week/30 Days Discharge Instructions: Apply AandD Ointment as directed Prim Dressing: Hydrofera Blue Ready Transfer Foam, 2.5x2.5 (in/in) 3 x Per Week/30 Days ary Discharge Instructions: Apply Hydrofera Blue Ready to wound bed as directed Secondary Dressing: ABD Pad 5x9 (in/in) (Generic) 3 x Per Week/30 Days Discharge Instructions: Cover with ABD pad Secured With: Medipore T - 15M Medipore H Soft Cloth Surgical T ape ape, 2x2 (in/yd) (Generic) 3 x Per Week/30 Days 1 Continue wiht hydrofera Blue, abd 2 No debridement Electronic Signature(s) Signed: 02/26/2023 6:24:37 PM By: Linton Ham MD Entered By: Linton Ham on 02/13/2023 17:10:33 -------------------------------------------------------------------------------- SuperBill Details Patient Name: Date of Service: MA RUFINO, TACK 02/06/2023 Medical Record Number: [id number] Patient Account Number: [id number] Date of Birth/Sex: Treating RN: 13-May-1966 (58 y.o. [name] Primary Care Provider: Charlott Rakes Other Clinician: Referring Provider: Treating Provider/Extender: RO BSO N, MICHA EL Lonn [place], Enobong Weeks in Treatment: 33 Diagnosis Coding ICD-10 Codes Code Description [phone] Other chronic osteomyelitis, left ankle and foot E11.621 Type 2 diabetes mellitus with foot ulcer L97.524 Non-pressure chronic ulcer of other part of left foot with necrosis of bone Facility Procedures : CPT4  Code: [id number] Description: 99213 - WOUND CARE VISIT-LEV 3 EST PT Modifier: Quantity: 1 Physician Procedures : CPT4 Code Description Modifier [id number] - WC PHYS LEVEL 3 - EST PT ICD-10 Diagnosis Description [id number] Other chronic osteomyelitis, left ankle and foot E11.621 Type 2 diabetes mellitus  with foot ulcer L97.524 Non-pressure chronic ulcer of other  part of left foot with necrosis of bone Quantity: 1 Electronic Signature(s) Mena, Devontae ([id number]) [id number] Page 7 of 7 Signed: 02/26/2023 6:24:37 PM By: Linton Ham MD Previous Signature: 02/06/2023 4:03:34 PM Version By: Linton Ham MD Previous Signature: 02/08/2023 4:59:51 PM Version By: Levora Dredge Entered By: Linton Ham on 02/13/2023 17:11:06

## 2023-02-12 ENCOUNTER — Other Ambulatory Visit: Payer: Self-pay

## 2023-02-13 ENCOUNTER — Ambulatory Visit: Payer: 59 | Admitting: Internal Medicine

## 2023-02-19 ENCOUNTER — Other Ambulatory Visit: Payer: Self-pay | Admitting: Family Medicine

## 2023-02-19 DIAGNOSIS — E1169 Type 2 diabetes mellitus with other specified complication: Secondary | ICD-10-CM

## 2023-02-19 NOTE — Telephone Encounter (Addendum)
Medication Refill - Medication: Insulin NPH, Human,, Isophane, (HUMULIN N KWIKPEN) 100 UNIT/ML Kiwkpe /Dulaglutide (TRULICITY) 1.5 0000000 SOPN   Has the patient contacted their pharma/cy? yes (Agent: If no, request that the patient contact the pharmacy for the refill. If patient does not wish to contact the pharmacy document the reason why and proceed with request.) (Agent: If yes, when and what did the pharmacy advise?)contact pcp  Preferred Pharmacy (with phone number or street name):  [address] [place] [address]  Phone: [phone] Fax: [phone]  Hours: M-F 7:30a-6:00p   Has the patient been seen for an appointment in the last year OR does the patient have an upcoming appointment? yes  Agent: Please be advised that RX refills may take up to 3 business days. We ask that you follow-up with your pharmacy.

## 2023-02-20 NOTE — Telephone Encounter (Signed)
Unable to refill per protocol, Rx request is too soon. Last refill 12/12/22.  Requested Prescriptions  Pending Prescriptions Disp Refills   Insulin NPH, Human,, Isophane, (HUMULIN N KWIKPEN) 100 UNIT/ML Kiwkpen 30 mL 5    Sig: Inject 22 Units into the skin 2 (two) times daily.     Endocrinology:  Diabetes - Insulins Failed - 02/19/2023  1:26 PM      Failed - HBA1C is between 0 and 7.9 and within 180 days    HbA1c, POC (controlled diabetic range)  Date Value Ref Range Status  12/12/2022 10.3 (A) 0.0 - 7.0 % Final         Passed - Valid encounter within last 6 months    Recent Outpatient Visits           2 months ago Type 2 diabetes mellitus with other specified complication, with long-term current use of insulin ([place])   [place], [place], MD   3 months ago Type 2 diabetes mellitus with other specified complication, with long-term current use of insulin [facility])   [place], [place] L, RPH-CPP   5 months ago Type 2 diabetes mellitus with other specified complication, with long-term current use of insulin ([place])   [place], [place], MD   7 months ago Type 2 diabetes mellitus with other specified complication, with long-term current use of insulin [facility])   [place], [place], [place]   8 months ago Type 2 diabetes mellitus with other specified complication, with long-term current use of insulin [facility])   [place] Charlott Rakes, MD       Future Appointments             In 3 weeks Charlott Rakes, MD [place]             Dulaglutide (TRULICITY) 1.5 0000000 SOPN 6 mL 6    Sig: Inject 1.5 mg into the skin once a week.     Endocrinology:  Diabetes - GLP-1 Receptor Agonists Failed - 02/19/2023  1:26 PM      Failed - HBA1C is between 0  and 7.9 and within 180 days    HbA1c, POC (controlled diabetic range)  Date Value Ref Range Status  12/12/2022 10.3 (A) 0.0 - 7.0 % Final         Passed - Valid encounter within last 6 months    Recent Outpatient Visits           2 months ago Type 2 diabetes mellitus with other specified complication, with long-term current use of insulin ([place])   [place], [place], MD   3 months ago Type 2 diabetes mellitus with other specified complication, with long-term current use of insulin [facility])   [place], [place] L, RPH-CPP   5 months ago Type 2 diabetes mellitus with other specified complication, with long-term current use of insulin ([place])   [place], [place], MD   7 months ago Type 2 diabetes mellitus with other specified complication, with long-term current use of insulin [facility])   [place], [place], [place]   8 months ago Type 2 diabetes mellitus with other specified complication, with long-term current use  of insulin [facility])   [place] Charlott Rakes, MD       Future Appointments             In 3 weeks Charlott Rakes, MD [place]

## 2023-02-27 ENCOUNTER — Ambulatory Visit: Payer: 59 | Admitting: Physician Assistant

## 2023-03-01 ENCOUNTER — Encounter: Payer: 59 | Admitting: Physician Assistant

## 2023-03-01 DIAGNOSIS — E11621 Type 2 diabetes mellitus with foot ulcer: Secondary | ICD-10-CM | POA: Diagnosis not present

## 2023-03-01 NOTE — Progress Notes (Addendum)
Merrilee JanskyMARTIN, Nhan ([id number]) [id number] Page 1 of 9 Visit Report for 03/01/2023 Chief Complaint Document Details Patient Name: Date of Service: Algis DownsMA RTIN, Nehan 03/01/2023 2:45 PM Medical Record Number: [id number] Patient Account Number: [id number] Date of Birth/Sex: Treating RN: 1966/04/06 (57 y.o. 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 Loel LoftyM) Woody, Selena BattenKim Primary Care Provider: Hoy RegisterNewlin, Enobong Other Clinician: Betha LoaVenable, Angie Referring Provider: Treating Provider/Extender: Bonnye FavaStone, Milyn Stapleton Newlin, Enobong Weeks in Treatment: 37 Debridement Performed for Assessment: Wound #1 Left,Plantar Foot Performed By: Physician Nelida MeuseStone, Vishwa Dais E., PA-C Debridement Type: Debridement Severity of Tissue Pre Debridement: Fat layer exposed Level of Consciousness (Pre-procedure): Awake and Alert Pre-procedure Verification/Time Out Yes - 15:15 Taken: Start Time: 15:15 T Area Debrided (L x W): otal 5.5 (cm) x 5.3 (cm) = 29.15 (cm) Tissue and other material debrided: Viable, Non-Viable, Callus, Subcutaneous, Skin: Dermis , Skin: Epidermis Level: Skin/Subcutaneous Tissue Debridement Description: Excisional Instrument: Curette Bleeding: Minimum Hemostasis Achieved: Pressure Response to Treatment: Procedure was tolerated  well Level of Consciousness (Post- Awake and Alert procedure): Post Debridement Measurements of Total Wound Length: (cm) 5.5 Width: (cm) 5.3 Depth: (cm) 0.7 Volume: (cm) 16.026 Character of Wound/Ulcer Post Debridement: Stable Severity of Tissue Post Debridement: Fat layer exposed Post Procedure Diagnosis Same as Pre-procedure Electronic Signature(s) Signed: 03/01/2023 3:55:38 PM By: Lenda KelpStone III, Gurdeep Keesey PA-C Signed: 03/09/2023 9:18:45 AM By: Elliot GurneyWoody, BSN, RN, CWS, Kim RN, BSN Entered By: Lenda KelpStone III, Axil Copeman on 03/01/2023 15:55:37 Daphine DeutscherMARTIN, Nedra HaiLEE ([id number]) [id number] Page 2 of 9 -------------------------------------------------------------------------------- HPI Details Patient Name: Date of Service: Algis DownsMA RTIN, [place] 03/01/2023 2:45 PM Medical Record Number: [id number] Patient Account Number: [id number] Date of Birth/Sex: Treating RN: 1966/04/06 (57 y.o. Arthur HolmsM) Woody, Kim Primary Care Provider: Hoy RegisterNewlin, Enobong Other Clinician: Betha LoaVenable, Angie Referring Provider: Treating Provider/Extender: Bonnye FavaStone, Delorise Hunkele Newlin, Enobong Weeks in Treatment: 37 History of Present Illness HPI Description: [facility] Dr. America Brownannon's care:   ADMISSION6/15/2023This is a 57 year old poorly controlled type II diabetic (last A1c 11.5%). In January of this year, he presented to the hospital with an abscess in his left foot. He also had a foreign body present. It was removed in the ER and he was admitted to the hospital for IV antibiotics. He was subsequently discharged on oral antibiotics. Due to financial concerns, he has not taken his diabetic medications (metformin and 70/30 insulin) and as a result, presented back to the emergency room on June 1 with worsening findings, including frank pus and osteomyelitis on MRI. BKA was recommended, but he declined. He was taken to the operating room by Dr. Lajoyce Cornersuda who performed an extensive debridement. Cultures were taken and he was  placed on IV antibiotics. He saw infectious disease while in the hospital who prescribed a 6-week course of IV antibiotics including cefazolin as well as oral metronidazole and levofloxacin. The infectious disease provider also indicated that he would benefit from below-knee amputation, but the patient desired another opinion and therefore he has been referred to wound care center for further evaluation and management. ABIs obtained while he was in  the hospital demonstrate adequate blood flow. Essentially the entire bottom of the patient's left foot has been removed. The heads of the majority of his metatarsal bones are exposed along with their accompanying tendons. Muscle and fat are exposed as well. The wound surface is fairly dry; the patient reports that he was not given any specific wound care instructions other than to place a dry dressing on it after showering. There is no odor or purulent drainage identified. 05/26/2022: The wound measures a little bit smaller today. There is some good granulation tissue beginning to form on the surface. He still has exposed bone and tendon. There is some slough accumulation. He saw infectious disease earlier this week and was frustrated that they recommended amputation. He is interested in another surgical opinion. He continues to smoke but says that he has cut down. He is using a knee scooter for mobility so that he can stay off of his foot. 06/05/2022: He saw Dr. Lajoyce Corners last week who was supportive of our efforts to try to salvage the foot. I referred him to vascular surgery to see Dr. Chestine Spore but he has not received an appointment yet. He continues on IV antibiotics. He says that after our conversation last week about him being unable to initiate hyperbarics if he was going to continue to smoke, he has not had a cigarette since. He is now at 4 weeks of conventional management and his insurance was initiated on July 1. Today, the wound continues to show evidence of  improvement. There is good granulation tissue forming. The metatarsals are still exposed but actually have some pink periosteum. There is still some slough and fibrinous exudate present. No odor or purulent drainage. We have just been using Dakin's dressing changes for now. Admission to Wound [facility] at [facility]: 06-15-2022 upon evaluation today patient presents for initial inspection here in our clinic though he has been seen by Dr. Lady Gary in [place] up to this point. I did review her notes and records as well they have been using Dakin's moistened gauze dressing which has done a great job at helping to clean out the bottom of his foot. Please see the discourse above for additional information in regard to treatment course up until the patient arrives here in the [place] clinic today. The patient is ready to get started with HBO therapy as soon as possible I do think based on all my review of his records this seems to be appropriate he does have chronic osteomyelitis is also been offered amputation this is obviously a limb salvage operation here. We are trying to prevent him from having to undergo an extensive amputation which would likely be a below-knee amputation which in turn is going to affect his quality of life he wants to try to avoid that if at all possible I think hyperbaric oxygen therapy is his best bet to achieve that goal. Currently he has been on IV Ancef. That actually ends tomorrow. Following that according to notes it appears he supposed to be taking Levaquin and Flagyl for an additional period of time although I am not certain exactly how long that with the. He has been seeing regional physicians infectious disease in [place] and again that so I recommended that he contact today in order to find out what the plan is going forward. His most recent hemoglobin A1c as noted above was 11.5 on 05-07-2022 he tells me trying to work on that he is also quit smoking as of this  point. 06-22-2022  upon evaluation today patient appears to be doing well currently in regard to his wound all things considered. He is showing signs of a lot of new granulation tissue and overall very pleased. He is changing this once a day with the Dakin's moistened gauze dressing. Fortunately I do not see any evidence of infection locally or systemically at this time. We have been trying to get him into the hyperbaric chamber he was having a lot of issues with sinus pressure and we have made referral and called multiple ENT specialist the earliest we have been able to get his August 9 at [facility] ear nose throat. They are going to try to work him in sooner if they have any opportunities to do so. Obviously I think the sooner he can do this the better. We need to get him in hyperbarics as quickly as possible. 7/26; patient presents for follow-up. He has been using Dakin's wet-to-dry dressings and oil emollient dressing over the exposed bone. He currently denies systemic signs of infection. He states he is scheduled to see infectious disease, Dr Thedore Mins next week. She had ordered an MRI of the left foot and he is scheduled to have this done tomorrow. He currently denies systemic signs of infection Including fever/chills, nausea/vomiting increased warmth or erythema to the wound bed or purulent drainage. He started hyperbaric oxygen treatment however did not tolerate this due to sinus pressure. He is scheduled to see ENT in 2 weeks for evaluation. He is not continuing HBO until he is cleared by ENT. 07-06-2022 patient presents today for follow-up concerning his plantar foot ulcer. The good news is this actually is significantly better compared to previous. Some of the bone which is necrotic is starting to slough off and I am going to perform debridement to clear this away today. Nonetheless I do think that the patient is going to need hyperbarics and we need to try to get it as soon as possible. He still  having a lot of sinus pressure I can even hear the congestion today. He is on 3 different antibiotics cefadroxil, metronidazole, and Levaquin currently for the osteomyelitis. For that reason this should actually help with the infection if he does have a chronic sinusitis I am also going to see about getting him on steroids to try to see if this can benefit him as well. He voiced understanding and he is in agreement with giving that shot. This is probably can to spike his blood sugars which I discussed with him he is going to be seeing his primary care provider later today I want him to let her know as well what is going on and why so that she could be aware but I really do think he needs to take the prednisone. We need to get him in the chamber as soon as possible to try to help with getting this wound to heal and closed so that he does not lose his foot. 07-13-2022 upon evaluation today patient appears to be doing better in regard to his wound he continues to show signs of new granulation am going to perform some debridement today but overall this is doing quite well. 07-20-2022 upon evaluation today patient's wound is actually showing some signs of improvement he does have 1 area on the fifth metatarsal region where there is some necrotic tendon that is noted at this point. Unfortunately this is preventing good granulation and over this point the rest of the metatarsal region is completely covered over with granulation tissue  which is great news there is some other slough and biofilm buildup that I will clearway as well. 07-27-2022 upon evaluation today patient appears to be doing well currently in regard to his plantar foot ulcer. He has been tolerating the dressing changes without complication fortunately. I do believe he is making excellent progress and each time I see him this is better and better is pretty much completely covered as far as any bone exposure is concerned and he still is on the IV  antibiotics were still also undergoing hyperbarics at this point that he just got started and is doing quite well with since getting the tubes in his ears.Marland Kitchen MOTTY, BORIN ([id number]) [id number] Page 3 of 9 08-03-2022 upon evaluation today patient appears to be doing well in general although has been having a lot of drainage compared to normal. It was actually leaking through his dressing today. He tells me he changed it last night. With that being said he has been up on his feet a lot more which I think is probably a big part of the issue here. Fortunately I do not see any signs of infection locally or systemically. 08-17-2022 upon evaluation today patient is doing well with regard to his wound this is actually measuring smaller which is good news. I am very pleased in that regard. With that being said he has not been participating HBO therapy as he really needs to afford to see this heal appropriately and completely. In fact he has only made 9 total HBO treatments in the past 4 weeks. Again this is not therapeutic and I discussed that with the patient. Nonetheless he is still improving and since the hyperbarics is not helping based on the discussion today the patient is going to discontinue HBO therapy at this point. He tells me that getting here 5 days a week just is not possible. He has had difficulty getting here and then also when he was here had difficulty with getting here at the right time which has also been part of the problem. Nonetheless either way I am pleased that he is doing better and my hope is that we will still be able to get him healed obviously he should continue with the antibiotics as well as the wound care as he has been doing this seems to be doing excellent for him but everything is improving quite nicely. 08-24-2022 upon evaluation today patient appears to be doing well currently in regard to his wound this is actually measuring significantly better  even compared to last week. I am very pleased in that regard I do not see any signs of infection and I think he is making excellent progress here. 08-31-2022 upon evaluation today patient's wound is actually showing signs of significant improvement. I am actually very pleased with where things stand currently. There does not appear to be any evidence of active infection locally or systemically at this time. No fevers, chills, nausea, vomiting, or diarrhea. 09-19-2022 upon evaluation patient's wound is actually showing signs of improvement though it still having quite a bit of drainage. I do believe he would benefit from switching over to [facility]. I discussed that with him today. He is definitely in agreement with that plan. 09-28-2022 upon evaluation patient's wound is actually showing signs of improvement. With that being said he has been using the [facility] but he has not had enough to really cover the whole wound. In fact he tells me he just got his supplies today that we had  ordered last week. Fortunately I do not see any evidence of active infection at this time. 10-05-2022 upon evaluation today patient appears to be doing well currently in regard to his foot wound there is actually much less debridement today to be done compared to previous. Fortunately I see no signs of active infection locally or systemically at this time. 11-02-2022 upon evaluation today patient appears to be doing well currently in regard to his wounds on the foot. Fortunately he seems to be making good progress is not quite as much of an improvement this week as it was last time I saw him but he had a lot going on and tells me that he has been up moving on it more. I do believe he needs to try to get this under control and I do believe staying off of it more will definitely help in that regard. 11-09-2022 upon evaluation today patient appears to be doing well currently in regard to his wound. He is showing signs of  improvement which is great news. Fortunately I do not see any evidence of active infection locally nor systemically at this time. No fever or chills noted 11-16-2022 patient's wound still slowly seems to be making progress. The good news is we have gotten the approval for the Apligraf which is awesome. Working to see about getting that started form hopefully will be able to get to him for the end of the year and then subsequently we can see if we can regain the approval for the final 3 in the first of the new year. Nonetheless I think getting this started as quickly as possible would be good for the patient currently. I do believe this is going to have a good result as far as helping with the healing and new tissue growth and epithelization in regard to the wound. He still using the offloading with a knee scooter at this point. 11-23-2022 upon evaluation today patient appears to be doing well currently in regard to his wound he does have a lot of drainage but fortunately no signs of infection at this point. Fortunately there does not appear to be any signs of active infection locally nor systemically at this time which is great news and overall I am extremely pleased with regard to where we stand. No fevers, chills, nausea, vomiting, or diarrhea. 12/28; patient arrived for Apligraf #2 today. The area is large and on the left plantar foot some irregular areas of hypergranulation however most of this looks clean. I think we are changing weekly because of drainage. 12-22-2022 upon evaluation today patient appears to be doing well currently in regard to his wound. He is not showing any signs of active infection which is great news. No fevers, chills, nausea, vomiting, or diarrhea. 01-09-2023 upon evaluation today patient's foot actually appears to be doing well I have not seen him since 19 January but he has made some progress. The only downside is unfortunately his scooter has broken his hand order a piece  to her. It is on the way. As soon as it arrives he will get back on that which will definitely help him significantly. 01-16-2023 upon evaluation today patient appears to be doing well currently in regard to his wound. Its about the same it does not look like there is any breakdown or worsening but it also does not look like it significantly smaller either. Fortunately I do not see any evidence of infection at this time. He does tell me that he got a  new cable for his knee scooter but he has not gotten it fixed yet. He just got that today he tells me. He also of note has been up helping his brother to paint because "I was bored". 01-23-2023 upon evaluation today patient's wound is actually showing signs of improvement already which is great news. He just got a scooter going last week after I saw him and he has done much better with the offloading as soon as he got the knee scooter back in action. In general I am very pleased with where we stand currently. I do not see any signs of active infection locally nor systemically which is great news. 01-29-2023 upon evaluation today patient appears to be doing well currently in regard to his wound. Has been tolerating the dressing changes without complication. Fortunately I do not see any evidence of active infection locally nor systemically which is great news. 3/5; Lt planter foot. Type 2 dm. Using a Arboriculturist. 03-01-2023 upon evaluation today patient appears to be doing well currently in regard to his wound except for a blister along the lateral aspect. Of note he has been up on his feet more which has led I think to the blister starting to become more prevalent he does not come in with his knee scooter today as he tells me has been taking care of his grandkids and he had no room in the car for it. He admits to being again much more active in the past week. Electronic Signature(s) Signed: 03/01/2023 3:53:47 PM By: Lenda Kelp PA-C Entered By: Lenda Kelp on 03/01/2023 15:53:47 -------------------------------------------------------------------------------- Physical Exam Details Patient Name: Date of Service: KELIN, BORUM 03/01/2023 2:45 PM Medical Record Number: [id number] Patient Account Number: [id number] Date of Birth/Sex: Treating RN: 1966-09-20 (57 y.o. [name] Primary Care Provider: [name] Other Clinician: Betha Loa [place], Nedra Hai ([id number]) [id number] Page 4 of 9 Referring Provider: Treating Provider/Extender: Laray Anger, Enobong Weeks in Treatment: 37 Constitutional Well-nourished and well-hydrated in no acute distress. Respiratory normal breathing without difficulty. Psychiatric this patient is able to make decisions and demonstrates good insight into disease process. Alert and Oriented x 3. pleasant and cooperative. Notes Upon inspection patient's wound did require sharp debridement clearway slough and biofilm on the surface of the wound he also on the more lateral aspect around the fourth and fifth toes had a blister area which I cleared away the good news is the majority of this was skin underneath which is macerated the bad news is this is still showing that he has been up on this more which I think is why he has not noticed a lot of improvement to be honest. Electronic Signature(s) Signed: 03/01/2023 3:54:21 PM By: Lenda Kelp PA-C Entered By: Lenda Kelp on 03/01/2023 15:54:20 -------------------------------------------------------------------------------- Physician Orders Details Patient Name: Date of Service: MA DERYK, BOZMAN 03/01/2023 2:45 PM Medical Record Number: [id number] Patient Account Number: [id number] Date of Birth/Sex: Treating RN: 1966/02/04 (57 y.o. [name] Primary Care Provider: [name] Other Clinician: Betha Loa Referring Provider: Treating Provider/Extender: Bonnye Fava Weeks in Treatment: 18 Verbal /  Phone Orders: Yes Clinician: Huel Coventry Read Back and Verified: Yes Diagnosis Coding ICD-10 Coding Code Description [phone] Other chronic osteomyelitis, left ankle and foot E11.621 Type 2 diabetes mellitus with foot ulcer L97.524 Non-pressure chronic ulcer of other part of left foot with necrosis of bone Follow-up Appointments Return Appointment in 1 week. Anesthetic (Use 'Patient Medications' Section for Anesthetic Order Entry)  Lidocaine applied to wound bed Edema Control - Lymphedema / Segmental Compressive Device / Other Elevate, Exercise Daily and A void Standing for Long Periods of Time. Elevate legs to the level of the heart and pump ankles as often as possible Elevate leg(s) parallel to the floor when sitting. Off-Loading Other: - knee scooter Wound Treatment Wound #1 - Foot Wound Laterality: Plantar, Left Peri-Wound Care: AandD Ointment 3 x Per Week/30 Days Discharge Instructions: Apply AandD Ointment as directed Prim Dressing: Hydrofera Blue Ready Transfer Foam, 2.5x2.5 (in/in) 3 x Per Week/30 Days ary Discharge Instructions: Apply Hydrofera Blue Ready to wound bed as directed Secondary Dressing: ABD Pad 5x9 (in/in) (Generic) 3 x Per Week/30 Days Discharge Instructions: Cover with ABD pad Secured With: Medipore T - 40M Medipore H Soft Cloth Surgical T ape ape, 2x2 (in/yd) (Generic) 3 x Per Week/30 Days Electronic Signature(s) Signed: 03/01/2023 4:02:24 PM By: Lenda Kelp PA-C Signed: 03/01/2023 4:25:05 PM By: Clois Comber, Toan [phone] PM By: Felecia Jan.pdf Page 5 of 9 Signed: 03/01/2023 Entered By: Betha Loa on 03/01/2023 15:21:11 -------------------------------------------------------------------------------- Problem List Details Patient Name: Date of Service: VORIS, TIGERT 03/01/2023 2:45 PM Medical Record Number: [id number] Patient Account Number: [id number] Date of Birth/Sex: Treating RN: 1966/06/23  (57 y.o. [name] Primary Care Provider: [name] Other Clinician: Betha Loa Referring Provider: Treating Provider/Extender: Bonnye Fava Weeks in Treatment: 37 Active Problems ICD-10 Encounter Code Description Active Date MDM Diagnosis [phone] Other chronic osteomyelitis, left ankle and foot 06/15/2022 No Yes E11.621 Type 2 diabetes mellitus with foot ulcer 06/15/2022 No Yes L97.524 Non-pressure chronic ulcer of other part of left foot with necrosis of bone 06/15/2022 No Yes Inactive Problems Resolved Problems Electronic Signature(s) Signed: 03/01/2023 2:51:02 PM By: Lenda Kelp PA-C Entered By: Lenda Kelp on 03/01/2023 14:51:01 -------------------------------------------------------------------------------- Progress Note Details Patient Name: Date of Service: MA DUNCAN, ALEJANDRO 03/01/2023 2:45 PM Medical Record Number: [id number] Patient Account Number: [id number] Date of Birth/Sex: Treating RN: Feb 16, 1966 (57 y.o. 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 America Brown care:   ADMISSION6/15/2023This is a 57 year old poorly controlled type II diabetic (last A1c 11.5%). In January of this year, he presented to the hospital with an abscess in his left foot. He also had a foreign body present. It was removed in the ER and he was admitted to the hospital for IV antibiotics. He was subsequently discharged on oral antibiotics. Due to financial concerns, he has not taken his diabetic medications (metformin and 70/30 insulin) and as a result, presented back to the emergency room on June 1 with worsening  findings, including frank pus and osteomyelitis on MRI. BKA was recommended, but he declined. He was taken to the operating room by Dr. Lajoyce Corners who performed an extensive debridement. Cultures were taken and he was placed on IV antibiotics. He saw infectious disease while in the hospital who prescribed a 6-week course of IV antibiotics including cefazolin as well as oral metronidazole and levofloxacin. The infectious disease provider also indicated that he would benefit from below-knee amputation, but the patient desired another opinion and therefore he has been referred to wound care center for further evaluation and management. ABIs obtained while he was in the hospital demonstrate  adequate blood flow. Essentially the entire bottom of the patient's left foot has been removed. The heads of the majority of his metatarsal bones are exposed along with their accompanying tendons. Muscle and fat are exposed as well. The wound surface is fairly dry; the patient reports that he was not given any specific wound care instructions other than to place a dry dressing on it after showering. There is no odor or purulent drainage identified. TAD, FANCHER ([id number]) [id number] Page 6 of 9 05/26/2022: The wound measures a little bit smaller today. There is some good granulation tissue beginning to form on the surface. He still has exposed bone and tendon. There is some slough accumulation. He saw infectious disease earlier this week and was frustrated that they recommended amputation. He is interested in another surgical opinion. He continues to smoke but says that he has cut down. He is using a knee scooter for mobility so that he can stay off of his foot. 06/05/2022: He saw Dr. Lajoyce Corners last week who was supportive of our efforts to try to salvage the foot. I referred him to vascular surgery to see Dr. Chestine Spore but he has not received an appointment yet. He continues on IV antibiotics. He says that  after our conversation last week about him being unable to initiate hyperbarics if he was going to continue to smoke, he has not had a cigarette since. He is now at 4 weeks of conventional management and his insurance was initiated on July 1. Today, the wound continues to show evidence of improvement. There is good granulation tissue forming. The metatarsals are still exposed but actually have some pink periosteum. There is still some slough and fibrinous exudate present. No odor or purulent drainage. We have just been using Dakin's dressing changes for now. Admission to Wound [facility] Center at [facility]: 06-15-2022 upon evaluation today patient presents for initial inspection here in our clinic though he has been seen by Dr. Lady Gary in [place] up to this point. I did review her notes and records as well they have been using Dakin's moistened gauze dressing which has done a great job at helping to clean out the bottom of his foot. Please see the discourse above for additional information in regard to treatment course up until the patient arrives here in the [place] clinic today. The patient is ready to get started with HBO therapy as soon as possible I do think based on all my review of his records this seems to be appropriate he does have chronic osteomyelitis is also been offered amputation this is obviously a limb salvage operation here. We are trying to prevent him from having to undergo an extensive amputation which would likely be a below-knee amputation which in turn is going to affect his quality of life he wants to try to avoid that if at all possible I think hyperbaric oxygen therapy is his best bet to achieve that goal. Currently he has been on IV Ancef. That actually ends tomorrow. Following that according to notes it appears he supposed to be taking Levaquin and Flagyl for an additional period of time although I am not certain exactly how long that with the. He has been seeing regional  physicians infectious disease in [place] and again that so I recommended that he contact today in order to find out what the plan is going forward. His most recent hemoglobin A1c as noted above was 11.5 on 05-07-2022 he tells me trying to work on that he is also quit smoking  as of this point. 06-22-2022 upon evaluation today patient appears to be doing well currently in regard to his wound all things considered. He is showing signs of a lot of new granulation tissue and overall very pleased. He is changing this once a day with the Dakin's moistened gauze dressing. Fortunately I do not see any evidence of infection locally or systemically at this time. We have been trying to get him into the hyperbaric chamber he was having a lot of issues with sinus pressure and we have made referral and called multiple ENT specialist the earliest we have been able to get his August 9 at [facility] ear nose throat. They are going to try to work him in sooner if they have any opportunities to do so. Obviously I think the sooner he can do this the better. We need to get him in hyperbarics as quickly as possible. 7/26; patient presents for follow-up. He has been using Dakin's wet-to-dry dressings and oil emollient dressing over the exposed bone. He currently denies systemic signs of infection. He states he is scheduled to see infectious disease, Dr Thedore Mins next week. She had ordered an MRI of the left foot and he is scheduled to have this done tomorrow. He currently denies systemic signs of infection Including fever/chills, nausea/vomiting increased warmth or erythema to the wound bed or purulent drainage. He started hyperbaric oxygen treatment however did not tolerate this due to sinus pressure. He is scheduled to see ENT in 2 weeks for evaluation. He is not continuing HBO until he is cleared by ENT. 07-06-2022 patient presents today for follow-up concerning his plantar foot ulcer. The good news is this actually is  significantly better compared to previous. Some of the bone which is necrotic is starting to slough off and I am going to perform debridement to clear this away today. Nonetheless I do think that the patient is going to need hyperbarics and we need to try to get it as soon as possible. He still having a lot of sinus pressure I can even hear the congestion today. He is on 3 different antibiotics cefadroxil, metronidazole, and Levaquin currently for the osteomyelitis. For that reason this should actually help with the infection if he does have a chronic sinusitis I am also going to see about getting him on steroids to try to see if this can benefit him as well. He voiced understanding and he is in agreement with giving that shot. This is probably can to spike his blood sugars which I discussed with him he is going to be seeing his primary care provider later today I want him to let her know as well what is going on and why so that she could be aware but I really do think he needs to take the prednisone. We need to get him in the chamber as soon as possible to try to help with getting this wound to heal and closed so that he does not lose his foot. 07-13-2022 upon evaluation today patient appears to be doing better in regard to his wound he continues to show signs of new granulation am going to perform some debridement today but overall this is doing quite well. 07-20-2022 upon evaluation today patient's wound is actually showing some signs of improvement he does have 1 area on the fifth metatarsal region where there is some necrotic tendon that is noted at this point. Unfortunately this is preventing good granulation and over this point the rest of the metatarsal region is completely  covered over with granulation tissue which is great news there is some other slough and biofilm buildup that I will clearway as well. 07-27-2022 upon evaluation today patient appears to be doing well currently in regard to his  plantar foot ulcer. He has been tolerating the dressing changes without complication fortunately. I do believe he is making excellent progress and each time I see him this is better and better is pretty much completely covered as far as any bone exposure is concerned and he still is on the IV antibiotics were still also undergoing hyperbarics at this point that he just got started and is doing quite well with since getting the tubes in his ears.Marland Kitchen 08-03-2022 upon evaluation today patient appears to be doing well in general although has been having a lot of drainage compared to normal. It was actually leaking through his dressing today. He tells me he changed it last night. With that being said he has been up on his feet a lot more which I think is probably a big part of the issue here. Fortunately I do not see any signs of infection locally or systemically. 08-17-2022 upon evaluation today patient is doing well with regard to his wound this is actually measuring smaller which is good news. I am very pleased in that regard. With that being said he has not been participating HBO therapy as he really needs to afford to see this heal appropriately and completely. In fact he has only made 9 total HBO treatments in the past 4 weeks. Again this is not therapeutic and I discussed that with the patient. Nonetheless he is still improving and since the hyperbarics is not helping based on the discussion today the patient is going to discontinue HBO therapy at this point. He tells me that getting here 5 days a week just is not possible. He has had difficulty getting here and then also when he was here had difficulty with getting here at the right time which has also been part of the problem. Nonetheless either way I am pleased that he is doing better and my hope is that we will still be able to get him healed obviously he should continue with the antibiotics as well as the wound care as he has been doing this seems to  be doing excellent for him but everything is improving quite nicely. 08-24-2022 upon evaluation today patient appears to be doing well currently in regard to his wound this is actually measuring significantly better even compared to last week. I am very pleased in that regard I do not see any signs of infection and I think he is making excellent progress here. 08-31-2022 upon evaluation today patient's wound is actually showing signs of significant improvement. I am actually very pleased with where things stand currently. There does not appear to be any evidence of active infection locally or systemically at this time. No fevers, chills, nausea, vomiting, or diarrhea. 09-19-2022 upon evaluation patient's wound is actually showing signs of improvement though it still having quite a bit of drainage. I do believe he would benefit from switching over to [facility]. I discussed that with him today. He is definitely in agreement with that plan. 09-28-2022 upon evaluation patient's wound is actually showing signs of improvement. With that being said he has been using the [facility] but he has not had enough to really cover the whole wound. In fact he tells me he just got his supplies today that we had ordered last week.  Fortunately I do not see any evidence of active infection at this time. 10-05-2022 upon evaluation today patient appears to be doing well currently in regard to his foot wound there is actually much less debridement today to be done compared to previous. Fortunately I see no signs of active infection locally or systemically at this time. 11-02-2022 upon evaluation today patient appears to be doing well currently in regard to his wounds on the foot. Fortunately he seems to be making good progress is not quite as much of an improvement this week as it was last time I saw him but he had a lot going on and tells me that he has been up moving on [place], [place] ([id number])  [phone].pdf Page 7 of 9 it more. I do believe he needs to try to get this under control and I do believe staying off of it more will definitely help in that regard. 11-09-2022 upon evaluation today patient appears to be doing well currently in regard to his wound. He is showing signs of improvement which is great news. Fortunately I do not see any evidence of active infection locally nor systemically at this time. No fever or chills noted 11-16-2022 patient's wound still slowly seems to be making progress. The good news is we have gotten the approval for the Apligraf which is awesome. Working to see about getting that started form hopefully will be able to get to him for the end of the year and then subsequently we can see if we can regain the approval for the final 3 in the first of the new year. Nonetheless I think getting this started as quickly as possible would be good for the patient currently. I do believe this is going to have a good result as far as helping with the healing and new tissue growth and epithelization in regard to the wound. He still using the offloading with a knee scooter at this point. 11-23-2022 upon evaluation today patient appears to be doing well currently in regard to his wound he does have a lot of drainage but fortunately no signs of infection at this point. Fortunately there does not appear to be any signs of active infection locally nor systemically at this time which is great news and overall I am extremely pleased with regard to where we stand. No fevers, chills, nausea, vomiting, or diarrhea. 12/28; patient arrived for Apligraf #2 today. The area is large and on the left plantar foot some irregular areas of hypergranulation however most of this looks clean. I think we are changing weekly because of drainage. 12-22-2022 upon evaluation today patient appears to be doing well currently in regard to his wound. He is not showing any signs of  active infection which is great news. No fevers, chills, nausea, vomiting, or diarrhea. 01-09-2023 upon evaluation today patient's foot actually appears to be doing well I have not seen him since 19 January but he has made some progress. The only downside is unfortunately his scooter has broken his hand order a piece to her. It is on the way. As soon as it arrives he will get back on that which will definitely help him significantly. 01-16-2023 upon evaluation today patient appears to be doing well currently in regard to his wound. Its about the same it does not look like there is any breakdown or worsening but it also does not look like it significantly smaller either. Fortunately I do not see any evidence of infection at this time. He does tell  me that he got a new cable for his knee scooter but he has not gotten it fixed yet. He just got that today he tells me. He also of note has been up helping his brother to paint because "I was bored". 01-23-2023 upon evaluation today patient's wound is actually showing signs of improvement already which is great news. He just got a scooter going last week after I saw him and he has done much better with the offloading as soon as he got the knee scooter back in action. In general I am very pleased with where we stand currently. I do not see any signs of active infection locally nor systemically which is great news. 01-29-2023 upon evaluation today patient appears to be doing well currently in regard to his wound. Has been tolerating the dressing changes without complication. Fortunately I do not see any evidence of active infection locally nor systemically which is great news. 3/5; Lt planter foot. Type 2 dm. Using a Arboriculturistcotter. 03-01-2023 upon evaluation today patient appears to be doing well currently in regard to his wound except for a blister along the lateral aspect. Of note he has been up on his feet more which has led I think to the blister starting to become  more prevalent he does not come in with his knee scooter today as he tells me has been taking care of his grandkids and he had no room in the car for it. He admits to being again much more active in the past week. Objective Constitutional Well-nourished and well-hydrated in no acute distress. Vitals Time Taken: 2:53 PM, Height: 70 in, Weight: 230 lbs, BMI: 33, Temperature: 97.7 F, Pulse: 81 bpm, Respiratory Rate: 18 breaths/min, Blood Pressure: 149/82 mmHg. Respiratory normal breathing without difficulty. Psychiatric this patient is able to make decisions and demonstrates good insight into disease process. Alert and Oriented x 3. pleasant and cooperative. General Notes: Upon inspection patient's wound did require sharp debridement clearway slough and biofilm on the surface of the wound he also on the more lateral aspect around the fourth and fifth toes had a blister area which I cleared away the good news is the majority of this was skin underneath which is macerated the bad news is this is still showing that he has been up on this more which I think is why he has not noticed a lot of improvement to be honest. Integumentary (Hair, Skin) Wound #1 status is Open. Original cause of wound was Gradually Appeared. The date acquired was: 12/04/2021. The wound has been in treatment 37 weeks. The wound is located on the Left,Plantar Foot. The wound measures 5.5cm length x 5.3cm width x 0.7cm depth; 22.894cm^2 area and 16.026cm^3 volume. There is Fat Layer (Subcutaneous Tissue) exposed. There is a medium amount of serosanguineous drainage noted. The wound margin is distinct with the outline attached to the wound base. There is large (67-100%) pink, hyper - granulation within the wound bed. There is no necrotic tissue within the wound bed. Assessment Active Problems ICD-10 Other chronic osteomyelitis, left ankle and foot Type 2 diabetes mellitus with foot ulcer Non-pressure chronic ulcer of other part  of left foot with necrosis of bone Daphine DeutscherMARTIN, Olin ([id number]) [id number] Page 8 of 9 Procedures Wound #1 Pre-procedure diagnosis of Wound #1 is a Diabetic Wound/Ulcer of the Lower Extremity located on the Left,Plantar Foot .Severity of Tissue Pre Debridement is: Fat layer exposed. There was a Excisional Skin/Subcutaneous Tissue Debridement with a total area of 29.15 sq cm performed  by Nelida Meuse., PA-C. With the following instrument(s): Curette to remove Viable and Non-Viable tissue/material. Material removed includes Callus, Subcutaneous Tissue, Skin: Dermis, and Skin: Epidermis. A time out was conducted at 15:15, prior to the start of the procedure. A Minimum amount of bleeding was controlled with Pressure. The procedure was tolerated well. Post Debridement Measurements: 5.5cm length x 5.3cm width x 0.7cm depth; 16.026cm^3 volume. Character of Wound/Ulcer Post Debridement is stable. Severity of Tissue Post Debridement is: Fat layer exposed. Post procedure Diagnosis Wound #1: Same as Pre-Procedure Plan Follow-up Appointments: Return Appointment in 1 week. Anesthetic (Use 'Patient Medications' Section for Anesthetic Order Entry): Lidocaine applied to wound bed Edema Control - Lymphedema / Segmental Compressive Device / Other: Elevate, Exercise Daily and Avoid Standing for Long Periods of Time. Elevate legs to the level of the heart and pump ankles as often as possible Elevate leg(s) parallel to the floor when sitting. Off-Loading: Other: - knee scooter WOUND #1: - Foot Wound Laterality: Plantar, Left Peri-Wound Care: AandD Ointment 3 x Per Week/30 Days Discharge Instructions: Apply AandD Ointment as directed Prim Dressing: Hydrofera Blue Ready Transfer Foam, 2.5x2.5 (in/in) 3 x Per Week/30 Days ary Discharge Instructions: Apply Hydrofera Blue Ready to wound bed as directed Secondary Dressing: ABD Pad 5x9 (in/in) (Generic) 3 x Per Week/30 Days Discharge  Instructions: Cover with ABD pad Secured With: Medipore T - 57M Medipore H Soft Cloth Surgical T ape ape, 2x2 (in/yd) (Generic) 3 x Per Week/30 Days 1. Would recommend currently that we have the patient continue to utilize the [facility] which I think is doing a good job. 2. I am also going to recommend that we have the patient continue to monitor for any signs of infection or worsening. Based on what I am seeing I think that he is not exhibiting evidence of infection right now but that something that could change quickly. 3. I am also going to suggest that the patient should continue with the ABD pad to cover which I think is doing quite well. We will see patient back for reevaluation in 1 week here in the clinic. If anything worsens or changes patient will contact our office for additional recommendations. He needs to get back on his knee scooter as quickly as possible. Electronic Signature(s) Signed: 03/01/2023 3:55:50 PM By: Lenda Kelp PA-C Previous Signature: 03/01/2023 3:55:07 PM Version By: Lenda Kelp PA-C Entered By: Lenda Kelp on 03/01/2023 15:55:50 -------------------------------------------------------------------------------- SuperBill Details Patient Name: Date of Service: MA SAINT, HANK 03/01/2023 Medical Record Number: [id number] Patient Account Number: [id number] Date of Birth/Sex: Treating RN: June 23, 1966 (57 y.o. [name] Primary Care Provider: [name] Other Clinician: Betha Loa Referring Provider: Treating Provider/Extender: Bonnye Fava Weeks in Treatment: 37 Diagnosis Coding ICD-10 Codes Code Description [id number] Other chronic osteomyelitis, left ankle and foot E11.621 Type 2 diabetes mellitus with foot ulcer L97.524 Non-pressure chronic ulcer of other part of left foot with necrosis of bone Facility Procedures : HAMZEH, TALL Code: [id number] ([id number]) I Description: 11042 - DEB SUBQ TISSUE 20 SQ CM/<  [id number] CD-10 Diagnosis Description L97.524 Non-pressure chronic ulcer of other part of left foot with necrosis of bone Modifier: [id number] Pa Quantity: 1 ge 9 of 9 : CPT4 Code: [id number] 11 I Description: 045 - DEB SUBQ TISS EA ADDL 20CM CD-10 Diagnosis Description L97.524 Non-pressure chronic ulcer of other part of left foot with necrosis of bone Modifier: 1 Quantity: Physician Procedures : CPT4 Code Description Modifier [id number] -  WC PHYS SUBQ TISS 20 SQ CM ICD-10 Diagnosis Description L97.524 Non-pressure chronic ulcer of other part of left foot with necrosis of bone Quantity: 1 : [id number] 11045 - WC PHYS SUBQ TISS EA ADDL 20 CM ICD-10 Diagnosis Description L97.524 Non-pressure chronic ulcer of other part of left foot with necrosis of bone Quantity: 1 Electronic Signature(s) Signed: 03/01/2023 3:56:02 PM By: Lenda Kelp PA-C Entered By: Lenda Kelp on 03/01/2023 15:56:02

## 2023-03-01 NOTE — Progress Notes (Addendum)
CLARK, CUFF ([id number]) [id number] Page 1 of 6 Visit Report for 03/01/2023 Arrival Information Details Patient Name: Date of Service: [name], [name] 03/01/2023 2:45 PM Medical Record Number: [id number] Patient Account Number: [id number] Date of Birth/Sex: Treating RN: 1966-09-30 (57 y.o. [name] Primary Care Meleana Commerford: [name] Other Clinician: Betha Loa Referring Montez Cuda: Treating Doral Ventrella/Extender: Bonnye Fava Weeks in Treatment: 37 Visit Information History Since Last Visit All ordered tests and consults were completed: No Patient Arrived: Ambulatory Added or deleted any medications: No Arrival Time: 14:52 Any new allergies or adverse reactions: No Transfer Assistance: None Had a fall or experienced change in No Patient Identification Verified: Yes activities of daily living that may affect Secondary Verification Process Completed: Yes risk of falls: Patient Requires Transmission-Based Precautions: No Signs or symptoms of abuse/neglect since last visito No Patient Has Alerts: No Hospitalized since last visit: No Implantable device outside of the clinic excluding No cellular tissue based products placed in the center since last visit: Has Dressing in Place as Prescribed: Yes Has Footwear/Offloading in Place as Prescribed: No Pain Present Now: No Electronic Signature(s) Signed: 03/01/2023 4:25:05 PM By: Betha Loa Entered By: Betha Loa on 03/01/2023 14:53:20 -------------------------------------------------------------------------------- Clinic Level of Care Assessment Details Patient Name: Date of Service: [name], [name] 03/01/2023 2:45 PM Medical Record Number: [id number] Patient Account Number: [id number] Date of Birth/Sex: Treating RN: 11/13/1966 (57 y.o. [name] Primary Care Chanique Duca: [name] Other Clinician: Betha Loa Referring Shanisha Lech: Treating Turon Kilmer/Extender: Bonnye Fava Weeks in Treatment: 37 Clinic Level of Care Assessment Items TOOL 1 Quantity Score []  - 0 Use when EandM and Procedure is performed on INITIAL visit ASSESSMENTS - Nursing Assessment / Reassessment []  - 0 General Physical Exam (combine w/ comprehensive assessment (listed just below) when performed on new pt. evals) []  - 0 Comprehensive Assessment (HX, ROS, Risk Assessments, Wounds Hx, etc.) ASSESSMENTS - Wound and Skin Assessment / Reassessment []  - 0 Dermatologic / Skin Assessment (not related to wound area) ASSESSMENTS - Ostomy and/or Continence Assessment and Care []  - 0 Incontinence Assessment and Management []  - 0 Ostomy Care Assessment and Management (repouching, etc.) PROCESS - Coordination of Care []  - 0 Simple Patient / Family Education for ongoing care []  - 0 Complex (extensive) Patient / Family Education for ongoing care []  - 0 Staff obtains Chiropractor, Records, T Results / Process Orders est []  - 0 Staff telephones HHA, Nursing Homes / Clarify orders / etc []  - 0 Routine Transfer to another Facility (non-emergent condition) []  - 0 Routine Hospital Admission (non-emergent condition) GAPPA, Silviano ([id number]) [phone].pdf Page 2 of 6 []  - 0 New Admissions / Manufacturing engineer / Ordering NPWT Apligraf, etc. , []  - 0 Emergency Hospital Admission (emergent condition) PROCESS - Special Needs []  - 0 Pediatric / Minor Patient Management []  - 0 Isolation Patient Management []  - 0 Hearing / Language / Visual special needs []  - 0 Assessment of Community assistance (transportation, D/C planning, etc.) []  - 0 Additional assistance / Altered mentation []  - 0 Support Surface(s) Assessment (bed, cushion, seat, etc.) INTERVENTIONS - Miscellaneous []  - 0 External ear exam []  - 0 Patient Transfer (multiple staff / Nurse, adult / Similar devices) []  - 0 Simple Staple / Suture removal (25 or less) []  - 0 Complex Staple / Suture  removal (26 or more) []  - 0 Hypo/Hyperglycemic Management (do not check if billed separately) []  - 0 Ankle / Brachial Index (ABI) - do not check if billed separately Has  the patient been seen at the hospital within the last three years: Yes Total Score: 0 Level Of Care: ____ Electronic Signature(s) Signed: 03/01/2023 4:25:05 PM By: Betha Loa Entered By: Betha Loa on 03/01/2023 15:21:17 -------------------------------------------------------------------------------- Encounter Discharge Information Details Patient Name: Date of Service: [name], [name] 03/01/2023 2:45 PM Medical Record Number: [id number] Patient Account Number: [id number] Date of Birth/Sex: Treating RN: 20-Jun-1966 (57 y.o. [name] Primary Care Raney Antwine: [name] Other Clinician: Betha Loa Referring Amrom Ore: Treating Loa Idler/Extender: Bonnye Fava Weeks in Treatment: 37 Encounter Discharge Information Items Post Procedure Vitals Discharge Condition: Stable Temperature (F): 97.7 Ambulatory Status: Ambulatory Pulse (bpm): 81 Discharge Destination: Home Respiratory Rate (breaths/min): 18 Transportation: Private Auto Blood Pressure (mmHg): 149/82 Accompanied By: self Schedule Follow-up Appointment: Yes Clinical Summary of Care: Electronic Signature(s) Signed: 03/01/2023 4:25:05 PM By: Betha Loa Entered By: Betha Loa on 03/01/2023 15:54:55 -------------------------------------------------------------------------------- Lower Extremity Assessment Details Patient Name: Date of Service: [name], [name] 03/01/2023 2:45 PM Medical Record Number: [id number] Patient Account Number: [id number] Date of Birth/Sex: Treating RN: 04/03/1966 (57 y.o. [name] Primary Care Drisana Schweickert: [name] Other Clinician: Betha Loa Referring Vaniyah Lansky: Treating Mar Zettler/Extender: Bonnye Fava Weeks in Treatment: [address]) [place], Nedra Hai ([id number])  [id number] Page 3 of 6 Signed: 03/01/2023 4:25:05 PM By: Betha Loa Signed: 03/09/2023 9:18:45 AM By: Elliot Gurney, BSN, RN, CWS, Kim RN, BSN Entered By: Betha Loa on 03/01/2023 15:05:19 -------------------------------------------------------------------------------- Multi Wound Chart Details Patient Name: Date of Service: [name], [name] 03/01/2023 2:45 PM Medical Record Number: [id number] Patient Account Number: [id number] Date of Birth/Sex: Treating RN: 1966/07/09 (57 y.o. [name] Primary Care Mansa Willers: [name] Other Clinician: Betha Loa Referring Ethaniel Garfield: Treating Robi Mitter/Extender: Bonnye Fava Weeks in Treatment: 37 Vital Signs Height(in): 70 Pulse(bpm): 81 Weight(lbs): 230 Blood Pressure(mmHg): 149/82 Body Mass Index(BMI): 33 Temperature(F): 97.7 Respiratory Rate(breaths/min): 18 [1:Photos:] [N/A:N/A] Left, Plantar Foot N/A N/A Wound Location: Gradually Appeared N/A N/A Wounding Event: Diabetic Wound/Ulcer of the Lower N/A N/A Primary Etiology: Extremity Type II Diabetes N/A N/A Comorbid History: 12/04/2021 N/A N/A Date Acquired: 4 N/A N/A Weeks of Treatment: Open N/A N/A Wound Status: No N/A N/A Wound Recurrence: Yes N/A N/A Pending A mputation on Presentation: 5.5x5.3x0.7 N/A N/A Measurements L x W x D (cm) 22.894 N/A N/A A (cm) : rea 16.026 N/A N/A Volume (cm) : 77.20% N/A N/A % Reduction in A rea: 68.10% N/A N/A % Reduction in Volume: Grade 4 N/A N/A Classification: Medium N/A N/A Exudate A mount: Serosanguineous N/A N/A Exudate Type: red, brown N/A N/A Exudate Color: Distinct, outline attached N/A N/A Wound Margin: Large (67-100%) N/A N/A Granulation A mount: Pink, Hyper-granulation N/A N/A Granulation Quality: None Present (0%) N/A N/A Necrotic A mount: Fat Layer (Subcutaneous Tissue): Yes N/A N/A Exposed Structures: Fascia: No Tendon: No Muscle: No Joint: No Bone:  No Medium (34-66%) N/A N/A Epithelialization: Treatment Notes Electronic Signature(s) Signed: 03/01/2023 4:25:05 PM By: Betha Loa Entered By: Betha Loa on 03/01/2023 15:05:26 Multi-Disciplinary Care Plan Details -------------------------------------------------------------------------------- [name] ([id number]) [phone].pdf Page 4 of 6 Patient Name: Date of Service: [name], [name] 03/01/2023 2:45 PM Medical Record Number: [id number] Patient Account Number: [id number] Date of Birth/Sex: Treating RN: [birth year] (57 y.o. [name] Primary Care Harshika Mago: [name] Other Clinician: Betha Loa Referring Lareina Espino: Treating Narcisa Ganesh/Extender: Bonnye Fava Weeks in Treatment: 56 Active Inactive Electronic Signature(s) Signed: 03/27/2023 3:35:21 PM By: Elliot Gurney, BSN, RN, CWS, Kim RN, BSN Previous Signature: 03/01/2023 4:25:05 PM Version By:  Betha LoaVenable, Angie Previous Signature: 03/09/2023 9:18:45 AM Version By: Elliot GurneyWoody, BSN, RN, CWS, Kim RN, BSN Entered By: Elliot GurneyWoody, BSN, RN, CWS, Kim on 03/27/2023 15:35:21 -------------------------------------------------------------------------------- Pain Assessment Details Patient Name: Date of Service: [name], [name] 03/01/2023 2:45 PM Medical Record Number: [id number] Patient Account Number: [id number] Date of Birth/Sex: Treating RN: 11/08/1966 (57 y.o. Arthur HolmsM) Woody, Kim Primary Care Karliah Kowalchuk: Hoy RegisterNewlin, Enobong Other Clinician: Betha LoaVenable, Angie Referring Khamani Fairley: Treating Scotty Weigelt/Extender: Bonnye FavaStone, Hoyt Newlin, Enobong Weeks in Treatment: 37 Active Problems Location of Pain Severity and Description of Pain Patient Has Paino No Site Locations Pain Management and Medication Current Pain Management: Electronic Signature(s) Signed: 03/01/2023 4:25:05 PM By: Betha LoaVenable, Angie Signed: 03/09/2023 9:18:45 AM By: Elliot GurneyWoody, BSN, RN, CWS, Kim RN, BSN Entered By: Betha LoaVenable, Angie on 03/01/2023  14:56:13 -------------------------------------------------------------------------------- Patient/Caregiver Education Details Patient Name: Date of Service: [name], [name] 3/28/2024andnbsp2:45 PM Medical Record Number: [id number] Patient Account Number: [id number] Date of Birth/Gender: Treating RN: 11/08/1966 (57 y.o. Arthur HolmsM) Woody, Kim Primary Care Physician: Hoy RegisterNewlin, Enobong Other Clinician: Betha LoaVenable, Angie Referring Physician: Treating Physician/Extender: Bonnye FavaStone, Hoyt Newlin, Enobong Weeks in Treatment: [address]37 Education Assessment Y[place]oMARTIN, Nedra HaiLEE ([id number]) [id number] Page 5 of 6 Education Provided To: Patient Education Topics Provided Offloading: Handouts: Other: stay off foot as much as possible and continue using the knee scooter Methods: Explain/Verbal Responses: State content correctly Electronic Signature(s) Signed: 03/01/2023 4:25:05 PM By: Betha LoaVenable, Angie Entered By: Betha LoaVenable, Angie on 03/01/2023 15:33:19 -------------------------------------------------------------------------------- Wound Assessment Details Patient Name: Date of Service: [name], [name] 03/01/2023 2:45 PM Medical Record Number: [id number] Patient Account Number: [id number] Date of Birth/Sex: Treating RN: 11/08/1966 ([age] y.o. Arthur HolmsM) Woody, Kim Primary Care Amanat Hackel: Hoy RegisterNewlin, Enobong Other Clinician: Betha LoaVenable, Angie Referring Netha Dafoe: Treating Tahni Porchia/Extender: Bonnye FavaStone, Hoyt Newlin, Enobong Weeks in Treatment: 37 Wound Status Wound Number: 1 Primary Etiology: Diabetic Wound/Ulcer of the Lower Extremity Wound Location: Left, Plantar Foot Wound Status: Open Wounding Event: Gradually Appeared Comorbid History: Type II Diabetes Date Acquired: 12/04/2021 Weeks Of Treatment: 37 Clustered Wound: No Pending Amputation On Presentation Photos Wound Measurements Length: (cm) 5.5 Width: (cm) 5.3 Depth: (cm) 0.7 Area: (cm) 22.894 Volume: (cm) 16.026 % Reduction in Area: 77.2% % Reduction in Volume:  68.1% Epithelialization: Medium (34-66%) Wound Description Classification: Grade 4 Wound Margin: Distinct, outline attached Exudate Amount: Medium Exudate Type: Serosanguineous Exudate Color: red, brown Foul Odor After Cleansing: No Slough/Fibrino No Wound Bed Granulation Amount: Large (67-100%) Exposed Structure Granulation Quality: Pink, Hyper-granulation Fascia Exposed: No Necrotic Amount: None Present (0%) Fat Layer (Subcutaneous Tissue) Exposed: Yes Tendon Exposed: No Muscle Exposed: No Joint Exposed: No Kalama, Maximino ([id number]) [id number]) [id number] Page 6 of 6 Bone Exposed: No Electronic Signature(s) Signed: 03/01/2023 4:25:05 PM By: Betha LoaVenable, Angie Signed: 03/09/2023 9:18:45 AM By: Elliot GurneyWoody, BSN, RN, CWS, Kim RN, BSN Entered By: Betha LoaVenable, Angie on 03/01/2023 15:05:07 -------------------------------------------------------------------------------- Vitals Details Patient Name: Date of Service: [name], [name] 03/01/2023 2:45 PM Medical Record Number: [id number] Patient Account Number: [id number] Date of Birth/Sex: Treating RN: 11/08/1966 (57 y.o. Loel LoftyM) Woody, Selena BattenKim Primary Care Pratik Dalziel: Hoy RegisterNewlin, Enobong Other Clinician: Betha LoaVenable, Angie Referring Baillie Mohammad: Treating Devansh Riese/Extender: Bonnye FavaStone, Hoyt Newlin, Enobong Weeks in Treatment: 37 Vital Signs Time Taken: 14:53 Temperature (F): 97.7 Height (in): 70 Pulse (bpm): 81 Weight (lbs): 230 Respiratory Rate (breaths/min): 18 Body Mass Index (BMI): 33 Blood Pressure (mmHg): 149/82 Reference Range: 80 - 120 mg / dl Electronic Signature(s) Signed: 03/01/2023 4:25:05 PM By: Betha LoaVenable, Angie Entered By: Betha LoaVenable, Angie on 03/01/2023 14:56:09

## 2023-03-08 ENCOUNTER — Ambulatory Visit: Payer: 59 | Admitting: Physician Assistant

## 2023-03-13 ENCOUNTER — Ambulatory Visit: Payer: Medicaid Other | Attending: Family Medicine | Admitting: Family Medicine

## 2023-03-13 ENCOUNTER — Ambulatory Visit: Payer: 59 | Admitting: Physician Assistant

## 2023-03-20 ENCOUNTER — Ambulatory Visit: Payer: 59 | Admitting: Physician Assistant

## 2023-03-26 ENCOUNTER — Ambulatory Visit: Payer: Medicaid Other | Admitting: Physician Assistant

## 2023-04-04 ENCOUNTER — Ambulatory Visit: Payer: Medicaid Other | Admitting: Physician Assistant

## 2023-04-09 ENCOUNTER — Ambulatory Visit: Payer: Medicaid Other | Admitting: Physician Assistant

## 2023-05-04 ENCOUNTER — Other Ambulatory Visit (HOSPITAL_BASED_OUTPATIENT_CLINIC_OR_DEPARTMENT_OTHER): Payer: Self-pay

## 2023-06-26 IMAGING — MR MR FOOT*L* W/O CM
4 of 5 series · 18 of 40 positions shown · non-contrast
Comparison: X-ray 12/14/2021

CLINICAL DATA: Diabetic foot swelling with plantar ulceration at
the fifth toe

EXAM:
MRI OF THE LEFT FOOT WITHOUT CONTRAST
TECHNIQUE: Multiplanar, multisequence MR imaging of the left forefoot was
performed. No intravenous contrast was administered.

[Series 3: STIR · sagittal · 3.0mm · 0.33mm/px · 3 of 32 slices shown]
[im 6/32]
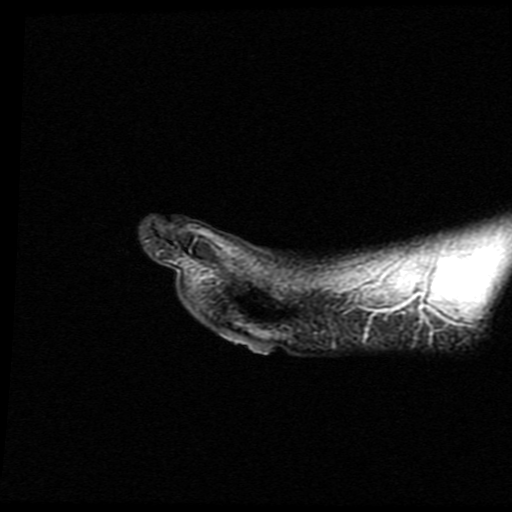
[im 16/32]
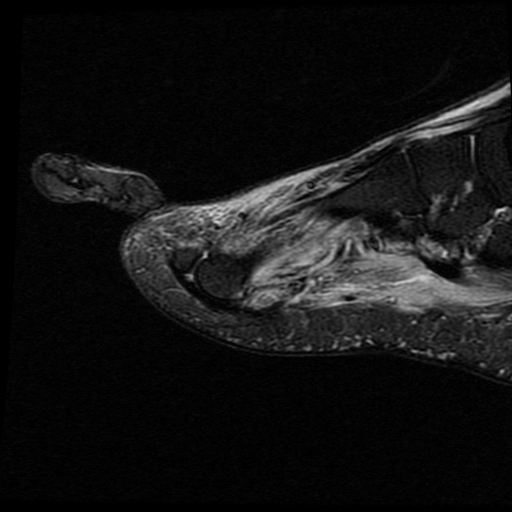
[im 26/32]
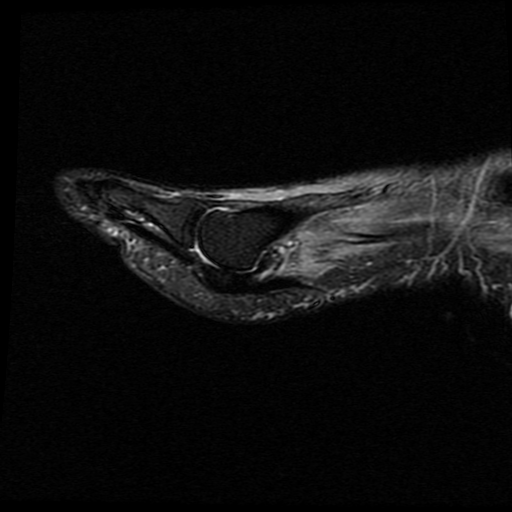

[Series 4: T1 · coronal · 3.0mm · 0.29mm/px · 6 of 52 slices shown (1 of 2)]
[im 1/52]
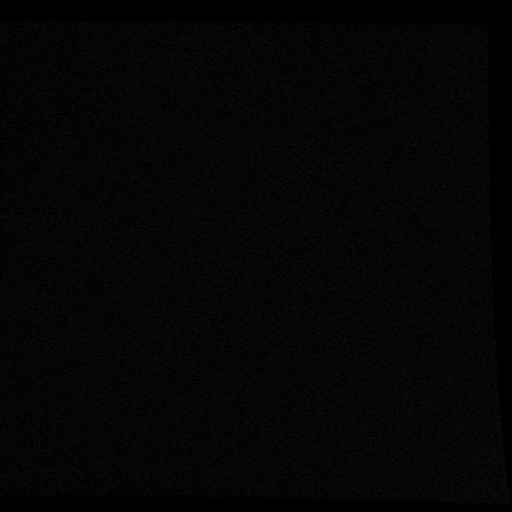
[im 6/52]
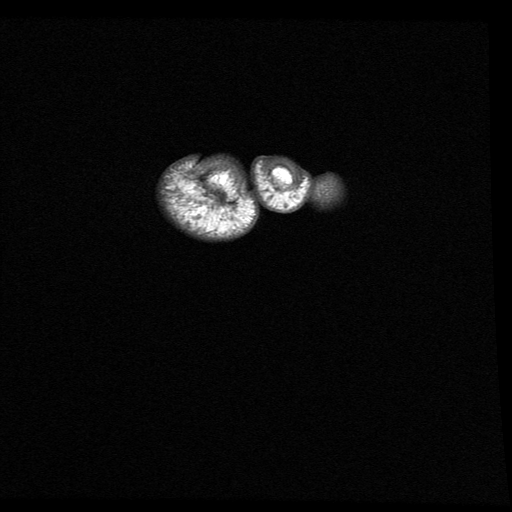
[im 11/52]
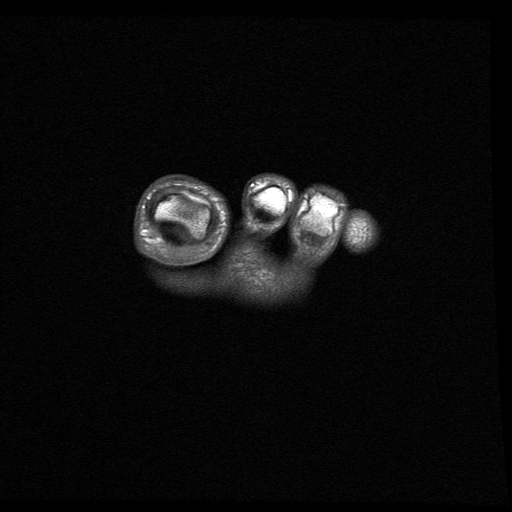
[im 16/52]
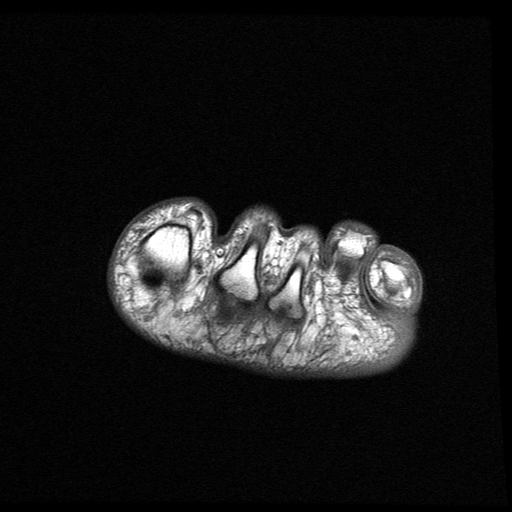
[im 26/52]
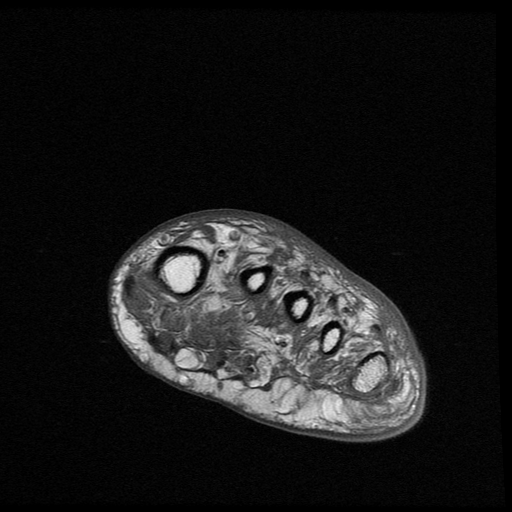
[im 46/52]
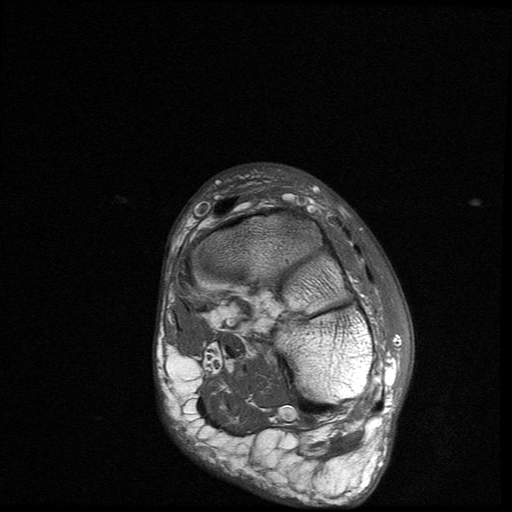

[Series 6: T1 · oblique · 3.0mm · 0.35mm/px · 3 of 32 slices shown (2 of 2)]
[im 7/32]
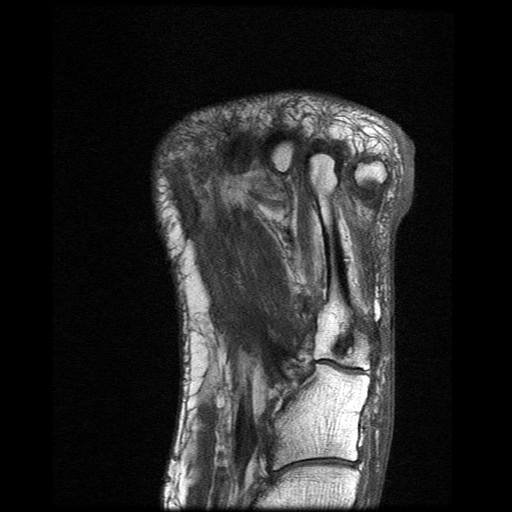
[im 19/32]
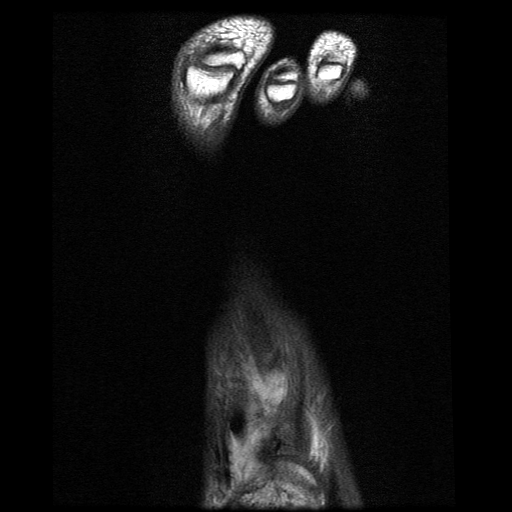
[im 32/32]
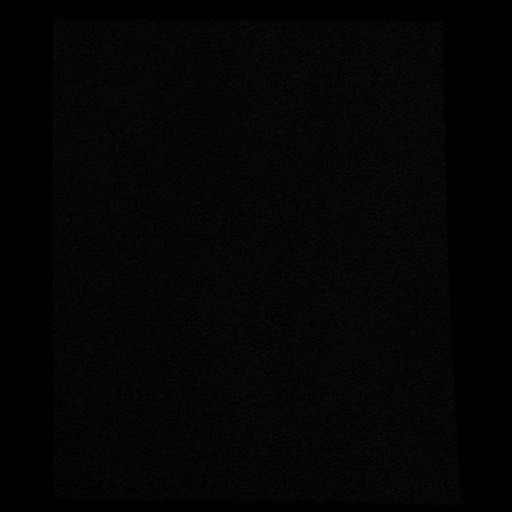

[Series 7: T2 fat-sat · oblique · 3.0mm · 0.35mm/px · 6 of 32 slices shown]
[im 1/32]
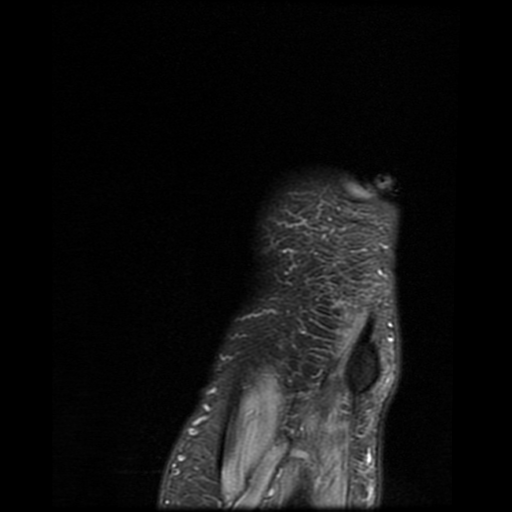
[im 7/32]
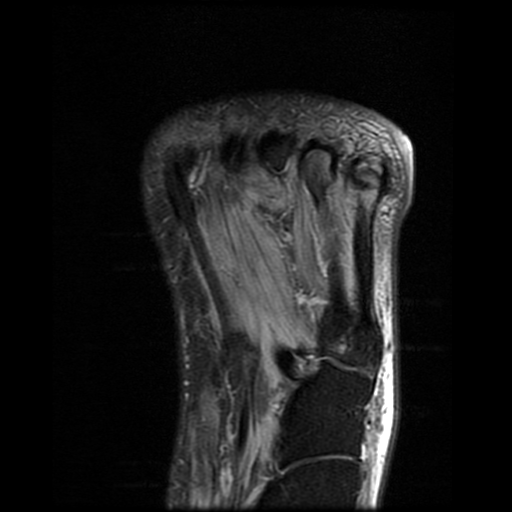
[im 13/32]
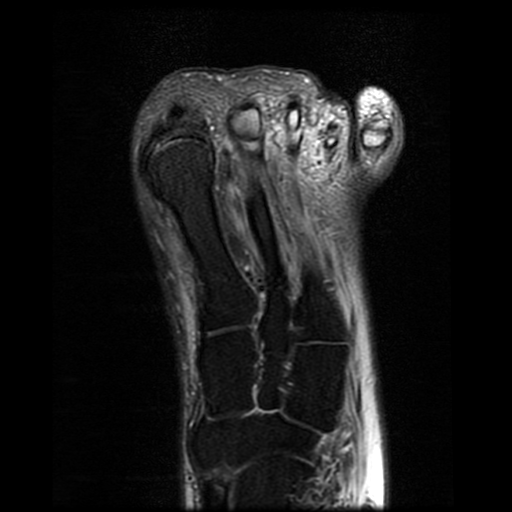
[im 19/32]
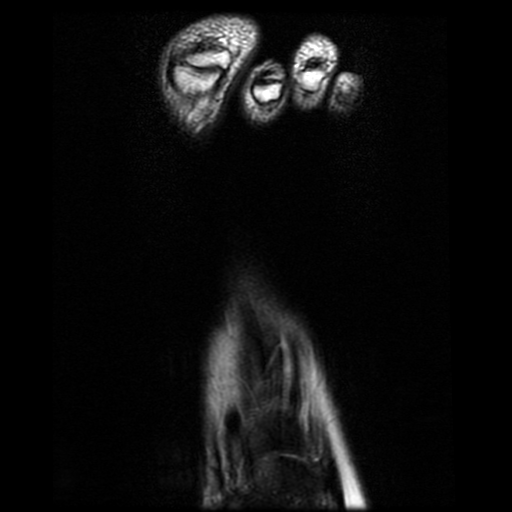
[im 25/32]
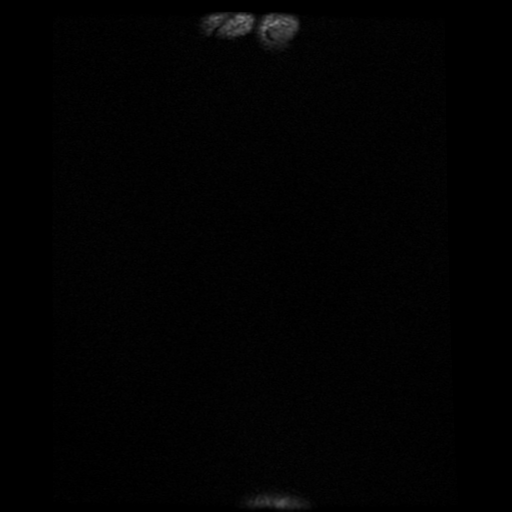
[im 32/32]
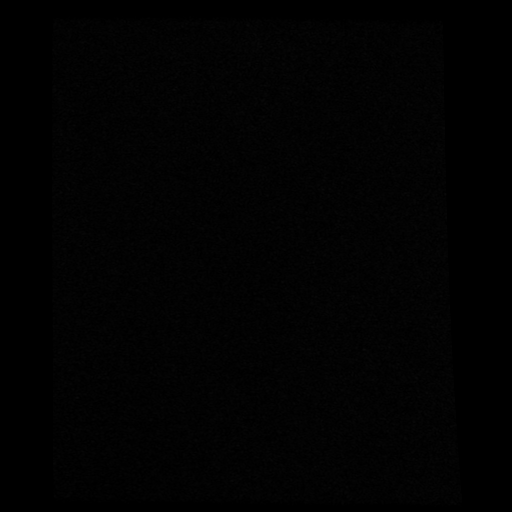

[18 of 40 positions shown; findings below may reference images not displayed]

FINDINGS: Bones/Joint/Cartilage

No acute fracture. No dislocation. No bone destruction or erosion.
No bone marrow edema or marrow replacement. Trace fifth MTP joint
effusion. Mild arthropathy involving the tarsometatarsal joints with
small subchondral cysts or ganglia in the fourth metatarsal base.

Ligaments

Intact Lisfranc ligament. Collateral ligaments of the forefoot are
intact.

Muscles and Tendons

Diffuse edema-like signal throughout the foot musculature may
represent a combination of myositis and denervation changes. No
intramuscular fluid collection.

Soft tissues

Shallow plantar ulceration underlying the fifth metatarsal head.
There is a thin elongated fluid collection within the subcutaneous
fat underlying the fifth metatarsal head near the ulceration site
measuring approximately 2.4 x 0.5 x 2.3 cm (series 3, images 25-27).
Low T1/T2 signal intensity rim. Appearance of the collection is more
suggestive of a adventitial bursa, although abscess not excluded
given the proximity to the ulceration. Skin thickening with
additional superficial wound or ulceration along the more lateral
aspect of the fifth toe (series 7, image 10). Mild diffuse
subcutaneous edema, most pronounced along the dorsal aspect of the
foot. No additional fluid collections.
IMPRESSION: 1. No evidence of acute osteomyelitis of the left forefoot.
2. Shallow plantar ulceration underlying the fifth metatarsal head.
Thin elongated fluid collection within the subcutaneous fat
underlying the fifth metatarsal head is more suggestive of a
adventitial bursa, although abscess not excluded given the proximity
to the ulceration.
3. Skin thickening with additional superficial wound or ulceration
along the more lateral aspect of the fifth toe.
4. Diffuse edema-like signal throughout the foot musculature may
represent a combination of myositis and denervation changes.

## 2023-08-11 ENCOUNTER — Encounter (HOSPITAL_COMMUNITY): Payer: Self-pay | Admitting: *Deleted

## 2023-08-11 ENCOUNTER — Other Ambulatory Visit: Payer: Self-pay

## 2023-08-11 ENCOUNTER — Emergency Department (HOSPITAL_COMMUNITY)
Admission: EM | Admit: 2023-08-11 | Discharge: 2023-08-11 | Disposition: A | Discharge status: Home / Self Care | Payer: Medicaid Other | Attending: Emergency Medicine | Admitting: Emergency Medicine

## 2023-08-11 ENCOUNTER — Emergency Department (HOSPITAL_COMMUNITY): Payer: Medicaid Other

## 2023-08-11 DIAGNOSIS — Z87891 Personal history of nicotine dependence: Secondary | ICD-10-CM | POA: Diagnosis not present

## 2023-08-11 DIAGNOSIS — Z7984 Long term (current) use of oral hypoglycemic drugs: Secondary | ICD-10-CM | POA: Insufficient documentation

## 2023-08-11 DIAGNOSIS — E1122 Type 2 diabetes mellitus with diabetic chronic kidney disease: Secondary | ICD-10-CM | POA: Insufficient documentation

## 2023-08-11 DIAGNOSIS — Z452 Encounter for adjustment and management of vascular access device: Secondary | ICD-10-CM | POA: Insufficient documentation

## 2023-08-11 DIAGNOSIS — N189 Chronic kidney disease, unspecified: Secondary | ICD-10-CM | POA: Insufficient documentation

## 2023-08-11 DIAGNOSIS — I129 Hypertensive chronic kidney disease with stage 1 through stage 4 chronic kidney disease, or unspecified chronic kidney disease: Secondary | ICD-10-CM | POA: Diagnosis not present

## 2023-08-11 DIAGNOSIS — Z79899 Other long term (current) drug therapy: Secondary | ICD-10-CM | POA: Diagnosis not present

## 2023-08-11 DIAGNOSIS — Z794 Long term (current) use of insulin: Secondary | ICD-10-CM | POA: Diagnosis not present

## 2023-08-11 LAB — CBC WITH DIFFERENTIAL/PLATELET
Abs Immature Granulocytes: 0.02 10*3/uL (ref 0.00–0.07)
Basophils Absolute: 0 10*3/uL (ref 0.0–0.1)
Basophils Relative: 1 %
Eosinophils Absolute: 0.2 10*3/uL (ref 0.0–0.5)
Eosinophils Relative: 2 %
HCT: 35.7 % — ABNORMAL LOW (ref 39.0–52.0)
Hemoglobin: 11.3 g/dL — ABNORMAL LOW (ref 13.0–17.0)
Immature Granulocytes: 0 %
Lymphocytes Relative: 32 %
Lymphs Abs: 2.3 10*3/uL (ref 0.7–4.0)
MCH: 25.7 pg — ABNORMAL LOW (ref 26.0–34.0)
MCHC: 31.7 g/dL (ref 30.0–36.0)
MCV: 81.1 fL (ref 80.0–100.0)
Monocytes Absolute: 0.5 10*3/uL (ref 0.1–1.0)
Monocytes Relative: 7 %
Neutro Abs: 4.2 10*3/uL (ref 1.7–7.7)
Neutrophils Relative %: 58 %
Platelets: 287 10*3/uL (ref 150–400)
RBC: 4.4 MIL/uL (ref 4.22–5.81)
RDW: 14.6 % (ref 11.5–15.5)
WBC: 7.3 10*3/uL (ref 4.0–10.5)
nRBC: 0 % (ref 0.0–0.2)

## 2023-08-11 LAB — COMPREHENSIVE METABOLIC PANEL
ALT: 17 U/L (ref 0–44)
AST: 12 U/L — ABNORMAL LOW (ref 15–41)
Albumin: 3.4 g/dL — ABNORMAL LOW (ref 3.5–5.0)
Alkaline Phosphatase: 60 U/L (ref 38–126)
Anion gap: 7 (ref 5–15)
BUN: 16 mg/dL (ref 6–20)
CO2: 24 mmol/L (ref 22–32)
Calcium: 9 mg/dL (ref 8.9–10.3)
Chloride: 103 mmol/L (ref 98–111)
Creatinine, Ser: 1.43 mg/dL — ABNORMAL HIGH (ref 0.61–1.24)
GFR, Estimated: 58 mL/min — ABNORMAL LOW (ref >60)
Glucose, Bld: 323 mg/dL — ABNORMAL HIGH (ref 70–99)
Potassium: 4.4 mmol/L (ref 3.5–5.1)
Sodium: 134 mmol/L — ABNORMAL LOW (ref 135–145)
Total Bilirubin: 0.4 mg/dL (ref 0.3–1.2)
Total Protein: 7 g/dL (ref 6.5–8.1)

## 2023-08-11 LAB — CK: Total CK: 214 U/L (ref 49–397)

## 2023-08-11 MED ORDER — LINEZOLID 600 MG PO TABS
600.0000 mg | ORAL_TABLET | Freq: Two times a day (BID) | ORAL | 0 refills | Status: AC
  Filled 2023-08-11: qty 28, 14d supply, fill #0

## 2023-08-11 NOTE — ED Notes (Signed)
Brother Gardiner Rhyme [phone] would like an update asap

## 2023-08-11 NOTE — ED Triage Notes (Signed)
The pt has a picc line in his lt arm   for 3 weeks he is getting antiobiotics in it..  the area appears like the line may be coming out

## 2023-08-11 NOTE — ED Triage Notes (Signed)
The pt had his last antibiotic thursday

## 2023-08-11 NOTE — Discharge Instructions (Signed)
We evaluated you for your PICC line problem.  Your PICC line was not in the right place.  We reviewed your infectious disease doctor recommendations, and they would like to start you on an oral medication called linezolid instead.  We have prescribed this for you.  Please take this as prescribed.  Please call your infectious disease doctors in [place] to let them know that you are now taking this medication.  If you develop any new symptoms such as fevers or chills, nausea or vomiting, lightheadedness or dizziness, chest pain, shortness of breath, or any other new symptoms, please return to the emergency department.

## 2023-08-12 NOTE — ED Provider Notes (Signed)
[place] EMERGENCY DEPARTMENT AT [facility] Provider Note  CSN: [id number] Arrival date & time: 08/11/23 1523  Chief Complaint(s) picc line not working  HPI [name] is a 57 y.o. male history of diabetes, osteomyelitis to the left foot, recent admission to hospital in [place] for osteomyelitis, discharged on PICC with long-term antibiotics presenting for PICC line displacement.  He reports that the PICC line seems to come out 11 cm.  Not sure when this happened.  Last received antibiotics on Thursday.  Currently just on daptomycin.  Otherwise feels well.  Denies any complaints like fevers or chills.  No foot pain.   Past Medical History Past Medical History:  Diagnosis Date   Diabetes mellitus without complication (HCC)    Tuberculosis    tested positive,treated with medications.   Patient Active Problem List   Diagnosis Date Noted   Hypertension associated with diabetes (HCC) 12/12/2022   Toe osteomyelitis, left (HCC) 12/12/2022   Pyogenic inflammation of bone (HCC) 06/02/2022   PICC (peripherally inserted central catheter) in place 06/02/2022   Medication management 06/02/2022   Diabetic foot infection (HCC) 06/02/2022   Smoking 06/02/2022   Tobacco abuse 05/06/2022   Hyperkalemia 05/06/2022   Type 2 diabetes mellitus (HCC) 05/05/2022   Obesity (BMI 30-39.9) 05/05/2022   Cellulitis and abscess of foot    AKI (acute kidney injury) (HCC) 05/04/2022   Injury of left foot    Diabetic foot ulcer (HCC) 12/14/2021   Home Medication(s) Prior to Admission medications   Medication Sig Start Date End Date Taking? Authorizing Provider  linezolid (ZYVOX) 600 MG tablet Take 1 tablet (600 mg total) by mouth 2 (two) times daily for 14 days. 08/11/23 08/25/23 Yes Lonell Grandchild, MD  acetaminophen (TYLENOL) 500 MG tablet Take 2,000 mg by mouth every 6 (six) hours as needed for mild pain or moderate pain. Patient not taking: Reported on 12/07/2022    [provider]   amLODipine (NORVASC) 5 MG tablet Take 1 tablet (5 mg total) by mouth daily. Patient not taking: Reported on 12/26/2022 12/12/22   [name], MD  atorvastatin (LIPITOR) 20 MG tablet Take 1 tablet (20 mg total) by mouth daily. Patient not taking: Reported on 12/26/2022 12/12/22   [name], MD  Continuous Blood Gluc Transmit (DEXCOM G6 TRANSMITTER) MISC Use to check blood sugar three times daily. Change transmitter once every 909 days. E11.69 Patient not taking: Reported on 12/07/2022 06/05/22   [name], MD  Dulaglutide (TRULICITY) 1.5 MG/0.5ML SOPN Inject 1.5 mg into the skin once a week. 12/12/22   [name], MD  gabapentin (NEURONTIN) 300 MG capsule Take 1 capsule (300 mg total) by mouth at bedtime. Patient not taking: Reported on 12/07/2022 09/11/22   [name], MD  Insulin NPH, Human,, Isophane, (HUMULIN N KWIKPEN) 100 UNIT/ML Kiwkpen Inject 22 Units into the skin 2 (two) times daily. 12/12/22   [name], MD  Insulin Pen Needle 32G X 4 MM MISC Use as directed to inject insulin up to 4 times daily. Patient not taking: Reported on 12/12/2022 11/21/22   [name], MD  metFORMIN (GLUCOPHAGE-XR) 500 MG 24 hr tablet Take 2 tablets (1,000 mg total) by mouth 2 (two) times daily with a meal. 12/12/22   [name], MD  methocarbamol (ROBAXIN) 500 MG tablet Take 1 tablet (500 mg total) by mouth every 6 (six) hours as needed for muscle spasms. Patient not taking: Reported on 12/07/2022 07/10/22   Nadara Mustard, MD  Past Surgical History Past Surgical History:  Procedure Laterality Date   FOOT SURGERY Left    I & D EXTREMITY Left 05/05/2022   Procedure: LEFT FOOT DEBRIDEMENT;  Surgeon: Nadara Mustard, MD;  Location: [facility] OR;  Service: Orthopedics;  Laterality: Left;   MYRINGOTOMY WITH TUBE PLACEMENT Bilateral 07/20/2022   Procedure: MYRINGOTOMY WITH  TUBE PLACEMENT;  Surgeon: Vernie Murders, MD;  Location: [facility];  Service: ENT;  Laterality: Bilateral;  Diabetic   TONSILLECTOMY     Family History Family History  Problem Relation Age of Onset   Colon cancer Neg Hx    Colon polyps Neg Hx    Crohn's disease Neg Hx    Esophageal cancer Neg Hx    Rectal cancer Neg Hx    Stomach cancer Neg Hx    Ulcerative colitis Neg Hx     Social History Social History   Tobacco Use   Smoking status: Former    Current packs/day: 0.00    Average packs/day: 0.3 packs/day for 25.0 years (6.3 ttl pk-yrs)    Types: Cigarettes    Start date: 09/17/1997    Quit date: 09/17/2022    Years since quitting: 0.9   Smokeless tobacco: Never   Tobacco comments:    "Quit"  Vaping Use   Vaping status: Never Used  Substance Use Topics   Alcohol use: No   Drug use: No   Allergies Patient has no known allergies.  Review of Systems Review of Systems  All other systems reviewed and are negative.   Physical Exam Vital Signs  I have reviewed the triage vital signs BP (!) 158/78 (BP Location: Right Arm)   Pulse 72   Temp 97.7 F (36.5 C) (Oral)   Resp 16   Ht 5\' 10"  (1.778 m)   Wt 115.2 kg   SpO2 100%   BMI 36.44 kg/m  Physical Exam Vitals and nursing note reviewed.  Constitutional:      General: He is not in acute distress.    Appearance: Normal appearance.  HENT:     Mouth/Throat:     Mouth: Mucous membranes are moist.  Eyes:     Conjunctiva/sclera: Conjunctivae normal.  Cardiovascular:     Rate and Rhythm: Normal rate and regular rhythm.  Pulmonary:     Effort: Pulmonary effort is normal. No respiratory distress.     Breath sounds: Normal breath sounds.  Abdominal:     General: Abdomen is flat.     Palpations: Abdomen is soft.     Tenderness: There is no abdominal tenderness.  Musculoskeletal:     Right lower leg: No edema.     Left lower leg: No edema.     Comments: Dressing in place over left foot.  PICC line in  the left bicep, does appear pulled out.  Dressing in place.  Surrounding area clean, dry and intact.  Skin:    General: Skin is warm and dry.     Capillary Refill: Capillary refill takes less than 2 seconds.  Neurological:     Mental Status: He is alert and oriented to person, place, and time. Mental status is at baseline.  Psychiatric:        Mood and Affect: Mood normal.        Behavior: Behavior normal.     ED Results and Treatments Labs (all labs ordered are listed, but only abnormal results are displayed) Labs Reviewed  COMPREHENSIVE METABOLIC PANEL - Abnormal; Notable for the following components:  Result Value   Sodium 134 (*)    Glucose, Bld 323 (*)    Creatinine, Ser 1.43 (*)    Albumin 3.4 (*)    AST 12 (*)    GFR, Estimated 58 (*)    All other components within normal limits  CBC WITH DIFFERENTIAL/PLATELET - Abnormal; Notable for the following components:   Hemoglobin 11.3 (*)    HCT 35.7 (*)    MCH 25.7 (*)    All other components within normal limits  CK                                                                                                                          Radiology DG Chest Port 1 View  Result Date: 08/11/2023 CLINICAL DATA:  PICC line placement. EXAM: PORTABLE CHEST 1 VIEW COMPARISON:  Two-view chest x-ray 06/09/2022 FINDINGS: The tip of the PICC line is in the innominate vein. Heart size is exaggerated by low lung volumes. No focal airspace disease is present. The visualized soft tissues and bony thorax are otherwise unremarkable. IMPRESSION: 1. Tip of the PICC line is in the innominate vein. 2. Low lung volumes. Electronically Signed   By: Marin Roberts M.D.   On: 08/11/2023 19:44    Pertinent labs & imaging results that were available during my care of the patient were reviewed by me and considered in my medical decision making (see MDM for details).  Medications Ordered in ED Medications - No data to display                                                                                                                                    Procedures Procedures  (including critical care time)  Medical Decision Making / ED Course   MDM:  57 year old presenting with PICC line issue.  On exam PICC line does appear pulled out.  Chest x-ray confirms that PICC line is not optimally positioned.  Reviewed patient's notes from [facility], including his infectious disease notes, their plan was that if he had another PICC line issue they would just start him on oral linezolid. He was recommended to have amputation but apparently refused. PICC line was removed by the PICC team nurses.  Linezolid was prescribed.  Reviewing their notes they had also wanted him to get a repeat lab check including CK and CMP.  Seen reassuring, CMP at baseline with  chronic kidney disease.  Discussed linezolid dosing with pharmacy.  Discussed with patient, including need to follow-up with his infectious disease doctors.  Advised that they should give them a call to make sure that the antibiotic length is appropriate.  He is supposed to get antibiotics until the end of September so given 2-week supply. Will discharge patient to home. All questions answered. Patient comfortable with plan of discharge. Return precautions discussed with patient and specified on the after visit summary.   Clinical Course as of 08/12/23 1248  Sat Aug 11, 2023  2143 MCHC: 31.7 [WS]    Clinical Course User Index [WS] Lonell Grandchild, MD     Additional history obtained:  -External records from outside source obtained and reviewed including: Chart review including previous notes, labs, imaging, consultation notes including outside ID notes   Lab Tests: -I ordered, reviewed, and interpreted labs.   The pertinent results include:   Labs Reviewed  COMPREHENSIVE METABOLIC PANEL - Abnormal; Notable for the following components:      Result Value   Sodium 134  (*)    Glucose, Bld 323 (*)    Creatinine, Ser 1.43 (*)    Albumin 3.4 (*)    AST 12 (*)    GFR, Estimated 58 (*)    All other components within normal limits  CBC WITH DIFFERENTIAL/PLATELET - Abnormal; Notable for the following components:   Hemoglobin 11.3 (*)    HCT 35.7 (*)    MCH 25.7 (*)    All other components within normal limits  CK    Notable for normal CK. CKD  EKG   EKG Interpretation Date/Time:    Ventricular Rate:    PR Interval:    QRS Duration:    QT Interval:    QTC Calculation:   R Axis:      Text Interpretation:           Imaging Studies ordered: I ordered imaging studies including CXR On my interpretation imaging demonstrates picc line misplacement I independently visualized and interpreted imaging. I agree with the radiologist interpretation   Medicines ordered and prescription drug management: Meds ordered this encounter  Medications   linezolid (ZYVOX) 600 MG tablet    Sig: Take 1 tablet (600 mg total) by mouth 2 (two) times daily for 14 days.    Dispense:  28 tablet    Refill:  0    -I have reviewed the patients home medicines and have made adjustments as needed   Cardiac Monitoring: The patient was maintained on a cardiac monitor.  I personally viewed and interpreted the cardiac monitored which showed an underlying rhythm of: NSR  Social Determinants of Health:  Diagnosis or treatment significantly limited by social determinants of health: obesity   Reevaluation: After the interventions noted above, I reevaluated the patient and found that their symptoms have improved  Co morbidities that complicate the patient evaluation  Past Medical History:  Diagnosis Date   Diabetes mellitus without complication (HCC)    Tuberculosis    tested positive,treated with medications.      Dispostion: Disposition decision including need for hospitalization was considered, and patient discharged from emergency department.    Final  Clinical Impression(s) / ED Diagnoses Final diagnoses:  Encounter for assessment of peripherally inserted central catheter (PICC)     This chart was dictated using voice recognition software.  Despite best efforts to proofread,  errors can occur which can change the documentation meaning.    Lonell Grandchild, MD  08/12/23 1248  

## 2023-08-13 ENCOUNTER — Other Ambulatory Visit: Payer: Self-pay

## 2023-08-14 ENCOUNTER — Other Ambulatory Visit: Payer: Self-pay

## 2023-08-24 ENCOUNTER — Ambulatory Visit: Payer: Medicaid Other | Admitting: Podiatry

## 2023-08-28 ENCOUNTER — Other Ambulatory Visit
Admission: RE | Admit: 2023-08-28 | Discharge: 2023-08-28 | Disposition: A | Discharge status: Home / Self Care | Payer: Medicaid Other | Source: Ambulatory Visit | Attending: Infectious Diseases | Admitting: Infectious Diseases

## 2023-08-28 ENCOUNTER — Ambulatory Visit: Payer: Medicaid Other | Attending: Infectious Diseases | Admitting: Infectious Diseases

## 2023-08-28 ENCOUNTER — Encounter: Payer: Self-pay | Admitting: Infectious Diseases

## 2023-08-28 VITALS — BP 131/87 | HR 87 | Temp 97.0°F

## 2023-08-28 DIAGNOSIS — Z8619 Personal history of other infectious and parasitic diseases: Secondary | ICD-10-CM | POA: Diagnosis not present

## 2023-08-28 DIAGNOSIS — B954 Other streptococcus as the cause of diseases classified elsewhere: Secondary | ICD-10-CM | POA: Diagnosis not present

## 2023-08-28 DIAGNOSIS — L089 Local infection of the skin and subcutaneous tissue, unspecified: Secondary | ICD-10-CM | POA: Diagnosis present

## 2023-08-28 DIAGNOSIS — B961 Klebsiella pneumoniae [K. pneumoniae] as the cause of diseases classified elsewhere: Secondary | ICD-10-CM | POA: Insufficient documentation

## 2023-08-28 DIAGNOSIS — M868X7 Other osteomyelitis, ankle and foot: Secondary | ICD-10-CM | POA: Insufficient documentation

## 2023-08-28 DIAGNOSIS — Z1610 Resistance to unspecified beta lactam antibiotics: Secondary | ICD-10-CM | POA: Insufficient documentation

## 2023-08-28 DIAGNOSIS — E11621 Type 2 diabetes mellitus with foot ulcer: Secondary | ICD-10-CM | POA: Insufficient documentation

## 2023-08-28 DIAGNOSIS — M86172 Other acute osteomyelitis, left ankle and foot: Secondary | ICD-10-CM | POA: Diagnosis not present

## 2023-08-28 DIAGNOSIS — M00872 Arthritis due to other bacteria, left ankle and foot: Secondary | ICD-10-CM | POA: Diagnosis not present

## 2023-08-28 DIAGNOSIS — L97523 Non-pressure chronic ulcer of other part of left foot with necrosis of muscle: Secondary | ICD-10-CM | POA: Insufficient documentation

## 2023-08-28 DIAGNOSIS — E1165 Type 2 diabetes mellitus with hyperglycemia: Secondary | ICD-10-CM | POA: Diagnosis not present

## 2023-08-28 DIAGNOSIS — Z7984 Long term (current) use of oral hypoglycemic drugs: Secondary | ICD-10-CM

## 2023-08-28 DIAGNOSIS — Z87891 Personal history of nicotine dependence: Secondary | ICD-10-CM | POA: Diagnosis not present

## 2023-08-28 DIAGNOSIS — B9689 Other specified bacterial agents as the cause of diseases classified elsewhere: Secondary | ICD-10-CM | POA: Insufficient documentation

## 2023-08-28 DIAGNOSIS — B9561 Methicillin susceptible Staphylococcus aureus infection as the cause of diseases classified elsewhere: Secondary | ICD-10-CM | POA: Diagnosis not present

## 2023-08-28 DIAGNOSIS — E1151 Type 2 diabetes mellitus with diabetic peripheral angiopathy without gangrene: Secondary | ICD-10-CM | POA: Diagnosis not present

## 2023-08-28 DIAGNOSIS — E1169 Type 2 diabetes mellitus with other specified complication: Secondary | ICD-10-CM | POA: Diagnosis not present

## 2023-08-28 DIAGNOSIS — E1140 Type 2 diabetes mellitus with diabetic neuropathy, unspecified: Secondary | ICD-10-CM | POA: Insufficient documentation

## 2023-08-28 LAB — COMPREHENSIVE METABOLIC PANEL
ALT: 16 U/L (ref 0–44)
AST: 13 U/L — ABNORMAL LOW (ref 15–41)
Albumin: 3.9 g/dL (ref 3.5–5.0)
Alkaline Phosphatase: 73 U/L (ref 38–126)
Anion gap: 8 (ref 5–15)
BUN: 21 mg/dL — ABNORMAL HIGH (ref 6–20)
CO2: 26 mmol/L (ref 22–32)
Calcium: 9.1 mg/dL (ref 8.9–10.3)
Chloride: 103 mmol/L (ref 98–111)
Creatinine, Ser: 1.56 mg/dL — ABNORMAL HIGH (ref 0.61–1.24)
GFR, Estimated: 52 mL/min — ABNORMAL LOW (ref >60)
Glucose, Bld: 260 mg/dL — ABNORMAL HIGH (ref 70–99)
Potassium: 4.4 mmol/L (ref 3.5–5.1)
Sodium: 137 mmol/L (ref 135–145)
Total Bilirubin: 0.4 mg/dL (ref 0.3–1.2)
Total Protein: 7.6 g/dL (ref 6.5–8.1)

## 2023-08-28 LAB — CBC WITH DIFFERENTIAL/PLATELET
Abs Immature Granulocytes: 0.01 10*3/uL (ref 0.00–0.07)
Basophils Absolute: 0 10*3/uL (ref 0.0–0.1)
Basophils Relative: 0 %
Eosinophils Absolute: 0.2 10*3/uL (ref 0.0–0.5)
Eosinophils Relative: 2 %
HCT: 35.6 % — ABNORMAL LOW (ref 39.0–52.0)
Hemoglobin: 11.4 g/dL — ABNORMAL LOW (ref 13.0–17.0)
Immature Granulocytes: 0 %
Lymphocytes Relative: 31 %
Lymphs Abs: 2.4 10*3/uL (ref 0.7–4.0)
MCH: 26.5 pg (ref 26.0–34.0)
MCHC: 32 g/dL (ref 30.0–36.0)
MCV: 82.6 fL (ref 80.0–100.0)
Monocytes Absolute: 0.6 10*3/uL (ref 0.1–1.0)
Monocytes Relative: 8 %
Neutro Abs: 4.5 10*3/uL (ref 1.7–7.7)
Neutrophils Relative %: 59 %
Platelets: 212 10*3/uL (ref 150–400)
RBC: 4.31 MIL/uL (ref 4.22–5.81)
RDW: 14.4 % (ref 11.5–15.5)
WBC: 7.7 10*3/uL (ref 4.0–10.5)
nRBC: 0 % (ref 0.0–0.2)

## 2023-08-28 LAB — C-REACTIVE PROTEIN: CRP: 1 mg/dL — ABNORMAL HIGH (ref <1.0)

## 2023-08-28 LAB — SEDIMENTATION RATE: Sed Rate: 32 mm/hr — ABNORMAL HIGH (ref 0–20)

## 2023-08-28 NOTE — Progress Notes (Unsigned)
NAME: [name]  DOB: 1966-10-17  MRN: [id number]  Date/Time: 08/28/2023 12:15 PM  Subjective:  Last seen Dec 2023 He has a h/o  diabetic foot infection -left foot- completed 5 months of antibiotic and was off  antibiotics and doing well since last time- He had moved in to help his mother and had a flare of te infection and got admitted to a hosp in [place] on 06/28/23 07/03/23 for worsening of left foot infection.  He saw infectious disease in the hospital Saw podiatrist who debrided the wound and sent bone culture from the fifth metatarsal Patient refused BKA The cultures had MRSA Klebsiella and group B strep.  He received PICC line and the plan of giving 6 weeks of IV vancomycin until 08/09/2023 and ceftriaxone until 07/26/2023.  As per ID physician's note vancomycin was changed to daptomycin because of subtherapeutic levels.  But Dapto caused increase in CK and they were following closely.  On 08/11/2023 patient came to [facility] ED as the PICC line was malfunctioning now almost had fallen off and so it was removed Patient was placed on linezolid and Augmentin for 2 more weeks after the ED physician spoke with ID in [place]. Patient is here to see me for follow-up He also has an appointment with wound clinic and podiatrist Does not have fever or chills , Following taken from previous note PMH, FH, SH, ROS reviewed  [name] is a 57 y.o. with a history of Dm,peripheral neuropathy with left foot wound for the past 9 months followed at wound clinic   On 6/1 MR left foot showed concern for early osteomyelitis along plantar side of 5th metatarsal, extensive gas in the plantar soft tissue  Taken to OR on 6/2(Ortho, Dr. Bradd Canary) for left foot debridement and found to have extensive necrotic soft tissue and muscle, tissue sent for Cx.Klebsiella oxytoca, MSSA, strep gordonii, strep cellulitis, Prevotella species beta-lactamase positive. -He refused amputation- He was followed by [facility] infectious  disease was sent on 6 weeks of IV . Cefazolin and metronidazole and levaquin. He had a repeat MRI on 06/29/22 and showed Septic arthritis involving the second, third and fourth MTP joints with associated osteomyelitis involving the metatarsals and proximal phalanges.   HE was restarted on Po cefadroxil, levaquin , metronidazole- asked to go for amputation- He came to [facility] wouldn clinic and was started hyperbaric oxygen and came to see me a month ago for 2nd opinion- HE is doing better HE took 9 sessions of HBO and stopped as he could not keep the appts He completed nearly 5 months of antibiotics  Past Medical History:  Diagnosis Date   Diabetes mellitus without complication (HCC)    Tuberculosis    tested positive,treated with medications.    Past Surgical History:  Procedure Laterality Date   FOOT SURGERY Left    I & D EXTREMITY Left 05/05/2022   Procedure: LEFT FOOT DEBRIDEMENT;  Surgeon: Nadara Mustard, MD;  Location: [facility] OR;  Service: Orthopedics;  Laterality: Left;   MYRINGOTOMY WITH TUBE PLACEMENT Bilateral 07/20/2022   Procedure: MYRINGOTOMY WITH TUBE PLACEMENT;  Surgeon: Vernie Murders, MD;  Location: [facility];  Service: ENT;  Laterality: Bilateral;  Diabetic   TONSILLECTOMY      Social History   Socioeconomic History   Marital status: Single    Spouse name: Not on file   Number of children: Not on file   Years of education: Not on file   Highest education level: Not on file  Occupational History   Not on file  Tobacco Use   Smoking status: Former    Current packs/day: 0.00    Average packs/day: 0.3 packs/day for 25.0 years (6.3 ttl pk-yrs)    Types: Cigarettes    Start date: 09/17/1997    Quit date: 09/17/2022    Years since quitting: 0.9   Smokeless tobacco: Never   Tobacco comments:    "Quit"  Vaping Use   Vaping status: Never Used  Substance and Sexual Activity   Alcohol use: No   Drug use: No   Sexual activity: Yes  Other Topics Concern   Not on  file  Social History Narrative   Not on file   Social Determinants of Health   Financial Resource Strain: Patient Declined (06/28/2023)   Received from [facility] [employer] Strain (CARDIA)    Difficulty of Paying Living Expenses: Patient declined  Food Insecurity: Patient Declined (06/28/2023)   Received from [employer] Vital Sign    Worried About Running Out of Food in the Last Year: Patient declined    Ran Out of Food in the Last Year: Patient declined  Transportation Needs: Patient Declined (06/28/2023)   Received from [place] [facility] [employer] - Administrator, Civil Service (Medical): Patient declined    Lack of Transportation (Non-Medical): Patient declined  Physical Activity: Not on file  Stress: Not on file  Social Connections: Unknown (05/24/2022)   Received from [facility]   Social Network    Social Network: Not on file  Intimate Partner Violence: Patient Declined (06/28/2023)   Received from [place] [facility] [employer], Afraid, Rape, and Kick questionnaire    Fear of Current or Ex-Partner: Patient declined    Emotionally Abused: Patient declined    Physically Abused: Patient declined    Sexually Abused: Patient declined    FH Sister, mother, grandmother - DM Brother- colon cancer  No Known Allergies  Current Outpatient Medications  Medication Sig Dispense Refill   amLODipine (NORVASC) 5 MG tablet Take 1 tablet (5 mg total) by mouth daily. 90 tablet 1   amoxicillin-clavulanate (AUGMENTIN) 875-125 MG tablet Take 1 tablet by mouth 2 (two) times daily.     atorvastatin (LIPITOR) 20 MG tablet Take 1 tablet (20 mg total) by mouth daily. 90 tablet 1   Continuous Blood Gluc Transmit (DEXCOM G6 TRANSMITTER) MISC Use to check blood sugar three times daily. Change transmitter once every 909 days. E11.69 1 each 1   gabapentin (NEURONTIN) 300 MG capsule Take  1 capsule (300 mg total) by mouth at bedtime. 30 capsule 3   Insulin NPH, Human,, Isophane, (HUMULIN N KWIKPEN) 100 UNIT/ML Kiwkpen Inject 22 Units into the skin 2 (two) times daily. 30 mL 5   Insulin Pen Needle 32G X 4 MM MISC Use as directed to inject insulin up to 4 times daily. 100 each 0   linezolid (ZYVOX) 600 MG tablet Take 1 tablet (600 mg total) by mouth 2 (two) times daily for 14 days. 28 tablet 0   metFORMIN (GLUCOPHAGE-XR) 500 MG 24 hr tablet Take 2 tablets (1,000 mg total) by mouth 2 (two) times daily with a meal. 120 tablet 6   methocarbamol (ROBAXIN) 500 MG tablet Take 1 tablet (500 mg total) by mouth every 6 (six) hours as needed for muscle spasms. 30 tablet 0   Current Facility-Administered Medications  Medication Dose Route Frequency  Provider Last Rate Last Admin   0.9 %  sodium chloride infusion  500 mL Intravenous Continuous Danis, Starr [place] III, MD         Abtx:  Anti-infectives (From admission, onward)    None       REVIEW OF SYSTEMS:  Const: negative fever, negative chills, negative weight loss Eyes: negative diplopia or visual changes, negative eye pain ENT: negative coryza, negative sore throat Resp: negative cough, hemoptysis, dyspnea Cards: negative for chest pain, palpitations, lower extremity edema GU: negative for frequency, dysuria and hematuria GI: Negative for abdominal pain, diarrhea, bleeding, constipation Skin: negative for rash and pruritus Heme: negative for easy bruising and gum/nose bleeding NW:GNFA foot ulcer Neurolo:negative for headaches, dizziness, vertigo, memory problems  Psych: negative for feelings of anxiety, depression  Endocrine:  diabetes- got a new pcp and on metformin xr Allergy/Immunology- negative for any medication or food allergies ?  Objective:  VITALS:  BP 131/87   Pulse 87   Temp (!) 97 F (36.1 C) (Oral)   SpO2 95%   PHYSICAL EXAM:  General: Alert, cooperative, no distress, appears stated age.  Lungs: Clear to  auscultation bilaterally. No Wheezing or Rhonchi. No rales. Heart: Regular rate and rhythm, no murmur, rub or gallop. Abdomen: Soft, non-tender,not distended. Bowel sounds normal. No masses Extremities:   08/28/23       11/30/22      10/31/22  08/29/22   08/01/22   07/10/22   07/04/22   06/20/22   05/18/22   05/09/22   05/04/22    Skin: No rashes or lesions. Or bruising Lymph: Cervical, supraclavicular normal. Neurologic: peripheral neuropathy Pertinent Labs   ? Impression/Recommendation DM poorly controlled with peripheral neuropathy with left foot  wound with underlying osteomyelitis- debridement in June 2023- took 5 months o antibiotic Was told to get amputation, wanted to save his foot at all cost- so transferred care ot [facility] wound center- started hyperbaric oxygen- and got 9 and then stopped as he could not go there 5 times a week  Wound improving significantly ? Completed cefadroxil, levaquin and flagyl X 4 months and prior to that nearly 8 weeks of IV Oral antibiotics stopped 10/31/22 The wound looks really good and nice granulation tissue- was getting apligraf at the wound clinic He then moved to [place] in February to take care of his mother The wound flared up to on his left foot and he was hospitalized at [facility] in July and was released on vancomycin and ceftriaxone he is now on p.o. linezolid and p.o. Augmentin He will be completing 6 weeks of antibiotics Today will do labs which includes ESR CRP Will decide whether need to continue antibiotics for some time Patient has a follow-up appointment with wound clinic as well as podiatrist.  DM-  on metformin XR Last Hba1c was 11.2-  Discussed healthy eating, foot care  Discussed the management with the patient in detail Follow up 1 month.

## 2023-08-28 NOTE — Patient Instructions (Signed)
You are here for fleft foot infection- you are now on the last 2 weeks of antibiotic- linezolid and augmentin- today will do labs- and then decide on furtehr antibiotics

## 2023-08-31 ENCOUNTER — Ambulatory Visit: Payer: Medicaid Other | Admitting: Podiatry

## 2023-09-05 ENCOUNTER — Telehealth: Payer: Self-pay

## 2023-09-05 NOTE — Telephone Encounter (Signed)
Patient informed of lab results and verbalized understanding.  [name]  

## 2023-09-05 NOTE — Telephone Encounter (Signed)
-----   Message from Lynn Ito sent at 09/04/2023  6:48 PM EDT ----- Please let him know that his recent labs looked good and after completing this course of linezolid and augmentin he does not need any more- Just follow with wound clinic for topical care- thx ----- Message ----- From: Interface, Lab In [place] Sent: 08/28/2023   1:14 PM EDT To: Lynn Ito, MD

## 2023-09-06 ENCOUNTER — Ambulatory Visit: Payer: Medicaid Other | Admitting: Podiatry

## 2023-09-10 ENCOUNTER — Encounter: Payer: 59 | Admitting: Podiatry

## 2023-09-10 NOTE — Progress Notes (Signed)
Patient was a no show for today's scheduled appt.

## 2023-10-04 ENCOUNTER — Ambulatory Visit: Payer: 59 | Admitting: Infectious Diseases

## 2023-10-11 ENCOUNTER — Telehealth: Payer: Self-pay | Admitting: Infectious Diseases

## 2023-10-26 ENCOUNTER — Other Ambulatory Visit: Payer: Self-pay | Admitting: Family Medicine

## 2023-10-26 DIAGNOSIS — Z794 Long term (current) use of insulin: Secondary | ICD-10-CM

## 2023-11-26 ENCOUNTER — Other Ambulatory Visit: Payer: Self-pay | Admitting: Family Medicine

## 2023-11-26 DIAGNOSIS — Z794 Long term (current) use of insulin: Secondary | ICD-10-CM

## 2023-11-27 ENCOUNTER — Other Ambulatory Visit: Payer: Self-pay | Admitting: Family Medicine

## 2023-11-27 DIAGNOSIS — Z794 Long term (current) use of insulin: Secondary | ICD-10-CM

## 2023-12-19 ENCOUNTER — Encounter: Payer: Self-pay | Admitting: Gastroenterology

## 2023-12-20 ENCOUNTER — Ambulatory Visit: Payer: Medicaid Other | Admitting: Podiatry

## 2023-12-20 ENCOUNTER — Encounter: Payer: Self-pay | Admitting: Podiatry

## 2023-12-20 VITALS — Ht 70.0 in | Wt 254.0 lb

## 2023-12-20 DIAGNOSIS — Z794 Long term (current) use of insulin: Secondary | ICD-10-CM | POA: Diagnosis not present

## 2023-12-20 DIAGNOSIS — E1169 Type 2 diabetes mellitus with other specified complication: Secondary | ICD-10-CM | POA: Diagnosis not present

## 2023-12-20 DIAGNOSIS — L97522 Non-pressure chronic ulcer of other part of left foot with fat layer exposed: Secondary | ICD-10-CM

## 2023-12-20 NOTE — Progress Notes (Signed)
Subjective:  Patient ID: [name], male    DOB: July 24, 1966,  MRN: [id number]  Chief Complaint  Patient presents with   Wound Check    Pt is here due to 2 open wounds on the bottom of his left foot states it has been there for 15 months, just moved to here 3 months ago, states he is a diabetic and is wanted to set up care with a podiatrist for care    58 y.o. male presents with the above complaint.  Patient presents with complaint of left forefoot wound plantar forefoot.  Patient states that he was being seen by a podiatrist here then had removed and now is back again.  Was to reestablish care with them and care center.  Denies any other acute complaints.  He is a diabetic.  He has been ambulating with surgical shoe and offloading boot   Review of Systems: Negative except as noted in the HPI. Denies N/V/F/Ch.  Past Medical History:  Diagnosis Date   Diabetes mellitus without complication (HCC)    Tuberculosis    tested positive,treated with medications.    Current Outpatient Medications:    amLODipine (NORVASC) 5 MG tablet, Take 1 tablet (5 mg total) by mouth daily., Disp: 90 tablet, Rfl: 1   amoxicillin-clavulanate (AUGMENTIN) 875-125 MG tablet, Take 1 tablet by mouth 2 (two) times daily., Disp: , Rfl:    atorvastatin (LIPITOR) 20 MG tablet, Take 1 tablet (20 mg total) by mouth daily., Disp: 90 tablet, Rfl: 1   Continuous Blood Gluc Transmit (DEXCOM G6 TRANSMITTER) MISC, Use to check blood sugar three times daily. Change transmitter once every 909 days. E11.69, Disp: 1 each, Rfl: 1   gabapentin (NEURONTIN) 300 MG capsule, Take 1 capsule (300 mg total) by mouth at bedtime., Disp: 30 capsule, Rfl: 3   Insulin NPH, Human,, Isophane, (HUMULIN N KWIKPEN) 100 UNIT/ML Kiwkpen, Inject 22 Units into the skin 2 (two) times daily., Disp: 30 mL, Rfl: 5   Insulin Pen Needle 32G X 4 MM MISC, Use as directed to inject insulin up to 4 times daily., Disp: 100 each, Rfl: 0   metFORMIN (GLUCOPHAGE-XR)  500 MG 24 hr tablet, Take 2 tablets (1,000 mg total) by mouth 2 (two) times daily with a meal., Disp: 120 tablet, Rfl: 6   methocarbamol (ROBAXIN) 500 MG tablet, Take 1 tablet (500 mg total) by mouth every 6 (six) hours as needed for muscle spasms., Disp: 30 tablet, Rfl: 0  Current Facility-Administered Medications:    0.9 %  sodium chloride infusion, 500 mL, Intravenous, Continuous, Danis, Starr Lake III, MD  Social History   Tobacco Use  Smoking Status Former   Current packs/day: 0.00   Average packs/day: 0.3 packs/day for 25.0 years (6.3 ttl pk-yrs)   Types: Cigarettes   Start date: 09/17/1997   Quit date: 09/17/2022   Years since quitting: 1.2  Smokeless Tobacco Never  Tobacco Comments   "Quit"    No Known Allergies Objective:  There were no vitals filed for this visit. Body mass index is 36.44 kg/m. Constitutional Well developed. Well nourished.  Vascular Dorsalis pedis pulses palpable bilaterally. Posterior tibial pulses palpable bilaterally. Capillary refill normal to all digits.  No cyanosis or clubbing noted. Pedal hair growth normal.  Neurologic Normal speech. Oriented to person, place, and time. Epicritic sensation to light touch grossly present bilaterally.  Dermatologic Left forefoot wound with granular wound base no signs of infection noted no purulent drainage noted no malodor present no cellulitis noted.  Orthopedic: Normal joint ROM without pain or crepitus bilaterally. No visible deformities. No bony tenderness.   Radiographs: None Assessment:   1. Chronic foot ulcer with fat layer exposed, left (HCC)   2. Type 2 diabetes mellitus with other specified complication, with long-term current use of insulin (HCC)    Plan:  Patient was evaluated and treated and all questions answered.  Left forefoot wound with granular wound base without infection -All questions and concerns were discussed with the patient in extensive detail -Patient will be referred to  the wound care center for further management.  He would like to be referred for further management as he has already established care at the wound care center in the past. -Continue Betadine wet-to-dry dressings for now  No follow-ups on file.

## 2024-01-28 ENCOUNTER — Ambulatory Visit: Payer: Medicaid Other | Admitting: Physician Assistant

## 2024-03-06 ENCOUNTER — Ambulatory Visit: Payer: Medicaid Other | Admitting: Physician Assistant

## 2024-03-25 ENCOUNTER — Inpatient Hospital Stay (HOSPITAL_COMMUNITY)

## 2024-03-25 ENCOUNTER — Emergency Department (HOSPITAL_COMMUNITY)

## 2024-03-25 ENCOUNTER — Inpatient Hospital Stay (HOSPITAL_COMMUNITY)
Admission: EM | Admit: 2024-03-25 | Discharge: 2024-03-28 | DRG: 616 | Disposition: A | Discharge status: Home / Self Care | Attending: Family Medicine | Admitting: Family Medicine

## 2024-03-25 ENCOUNTER — Other Ambulatory Visit: Payer: Self-pay

## 2024-03-25 DIAGNOSIS — L97428 Non-pressure chronic ulcer of left heel and midfoot with other specified severity: Secondary | ICD-10-CM | POA: Diagnosis present

## 2024-03-25 DIAGNOSIS — M868X8 Other osteomyelitis, other site: Secondary | ICD-10-CM | POA: Diagnosis present

## 2024-03-25 DIAGNOSIS — E1165 Type 2 diabetes mellitus with hyperglycemia: Secondary | ICD-10-CM | POA: Diagnosis present

## 2024-03-25 DIAGNOSIS — N182 Chronic kidney disease, stage 2 (mild): Secondary | ICD-10-CM | POA: Diagnosis present

## 2024-03-25 DIAGNOSIS — R739 Hyperglycemia, unspecified: Secondary | ICD-10-CM | POA: Diagnosis present

## 2024-03-25 DIAGNOSIS — M869 Osteomyelitis, unspecified: Secondary | ICD-10-CM | POA: Diagnosis not present

## 2024-03-25 DIAGNOSIS — Z91148 Patient's other noncompliance with medication regimen for other reason: Secondary | ICD-10-CM

## 2024-03-25 DIAGNOSIS — E11628 Type 2 diabetes mellitus with other skin complications: Secondary | ICD-10-CM | POA: Diagnosis present

## 2024-03-25 DIAGNOSIS — E119 Type 2 diabetes mellitus without complications: Secondary | ICD-10-CM

## 2024-03-25 DIAGNOSIS — A480 Gas gangrene: Secondary | ICD-10-CM | POA: Diagnosis present

## 2024-03-25 DIAGNOSIS — E1142 Type 2 diabetes mellitus with diabetic polyneuropathy: Secondary | ICD-10-CM | POA: Diagnosis present

## 2024-03-25 DIAGNOSIS — E1152 Type 2 diabetes mellitus with diabetic peripheral angiopathy with gangrene: Secondary | ICD-10-CM | POA: Diagnosis present

## 2024-03-25 DIAGNOSIS — F17210 Nicotine dependence, cigarettes, uncomplicated: Secondary | ICD-10-CM | POA: Diagnosis present

## 2024-03-25 DIAGNOSIS — E11649 Type 2 diabetes mellitus with hypoglycemia without coma: Secondary | ICD-10-CM | POA: Diagnosis not present

## 2024-03-25 DIAGNOSIS — Z8611 Personal history of tuberculosis: Secondary | ICD-10-CM

## 2024-03-25 DIAGNOSIS — N179 Acute kidney failure, unspecified: Secondary | ICD-10-CM | POA: Diagnosis present

## 2024-03-25 DIAGNOSIS — I152 Hypertension secondary to endocrine disorders: Secondary | ICD-10-CM | POA: Diagnosis present

## 2024-03-25 DIAGNOSIS — A419 Sepsis, unspecified organism: Secondary | ICD-10-CM | POA: Diagnosis not present

## 2024-03-25 DIAGNOSIS — Z794 Long term (current) use of insulin: Secondary | ICD-10-CM

## 2024-03-25 DIAGNOSIS — Z792 Long term (current) use of antibiotics: Secondary | ICD-10-CM

## 2024-03-25 DIAGNOSIS — L089 Local infection of the skin and subcutaneous tissue, unspecified: Secondary | ICD-10-CM | POA: Diagnosis not present

## 2024-03-25 DIAGNOSIS — E1169 Type 2 diabetes mellitus with other specified complication: Principal | ICD-10-CM | POA: Diagnosis present

## 2024-03-25 DIAGNOSIS — Z7984 Long term (current) use of oral hypoglycemic drugs: Secondary | ICD-10-CM | POA: Diagnosis not present

## 2024-03-25 DIAGNOSIS — E871 Hypo-osmolality and hyponatremia: Secondary | ICD-10-CM | POA: Diagnosis not present

## 2024-03-25 DIAGNOSIS — E1122 Type 2 diabetes mellitus with diabetic chronic kidney disease: Secondary | ICD-10-CM | POA: Diagnosis present

## 2024-03-25 DIAGNOSIS — M7989 Other specified soft tissue disorders: Secondary | ICD-10-CM | POA: Diagnosis not present

## 2024-03-25 DIAGNOSIS — E11621 Type 2 diabetes mellitus with foot ulcer: Secondary | ICD-10-CM | POA: Diagnosis present

## 2024-03-25 DIAGNOSIS — Z79899 Other long term (current) drug therapy: Secondary | ICD-10-CM | POA: Diagnosis not present

## 2024-03-25 DIAGNOSIS — L03031 Cellulitis of right toe: Secondary | ICD-10-CM | POA: Diagnosis present

## 2024-03-25 DIAGNOSIS — I129 Hypertensive chronic kidney disease with stage 1 through stage 4 chronic kidney disease, or unspecified chronic kidney disease: Secondary | ICD-10-CM | POA: Diagnosis present

## 2024-03-25 DIAGNOSIS — E876 Hypokalemia: Secondary | ICD-10-CM | POA: Diagnosis present

## 2024-03-25 LAB — I-STAT VENOUS BLOOD GAS, ED
Acid-Base Excess: 0 mmol/L (ref 0.0–2.0)
Bicarbonate: 24.6 mmol/L (ref 20.0–28.0)
Calcium, Ion: 1.05 mmol/L — ABNORMAL LOW (ref 1.15–1.40)
HCT: 35 % — ABNORMAL LOW (ref 39.0–52.0)
Hemoglobin: 11.9 g/dL — ABNORMAL LOW (ref 13.0–17.0)
O2 Saturation: 58 %
Potassium: 3.9 mmol/L (ref 3.5–5.1)
Sodium: 129 mmol/L — ABNORMAL LOW (ref 135–145)
TCO2: 26 mmol/L (ref 22–32)
pCO2, Ven: 38.2 mmHg — ABNORMAL LOW (ref 44–60)
pH, Ven: 7.417 (ref 7.25–7.43)
pO2, Ven: 30 mmHg — CL (ref 32–45)

## 2024-03-25 LAB — CBC
HCT: 33.7 % — ABNORMAL LOW (ref 39.0–52.0)
Hemoglobin: 10.8 g/dL — ABNORMAL LOW (ref 13.0–17.0)
MCH: 26.5 pg (ref 26.0–34.0)
MCHC: 32 g/dL (ref 30.0–36.0)
MCV: 82.6 fL (ref 80.0–100.0)
Platelets: 340 10*3/uL (ref 150–400)
RBC: 4.08 MIL/uL — ABNORMAL LOW (ref 4.22–5.81)
RDW: 13.7 % (ref 11.5–15.5)
WBC: 18 10*3/uL — ABNORMAL HIGH (ref 4.0–10.5)
nRBC: 0 % (ref 0.0–0.2)

## 2024-03-25 LAB — URINALYSIS, ROUTINE W REFLEX MICROSCOPIC
Bilirubin Urine: NEGATIVE
Glucose, UA: >=500 mg/dL — AB
Ketones, ur: 5 mg/dL — AB
Leukocytes,Ua: NEGATIVE
Nitrite: NEGATIVE
Protein, ur: 100 mg/dL — AB
Specific Gravity, Urine: 1.015 (ref 1.005–1.030)
pH: 5 (ref 5.0–8.0)

## 2024-03-25 LAB — BASIC METABOLIC PANEL WITH GFR
Anion gap: 14 (ref 5–15)
BUN: 14 mg/dL (ref 6–20)
CO2: 21 mmol/L — ABNORMAL LOW (ref 22–32)
Calcium: 8.2 mg/dL — ABNORMAL LOW (ref 8.9–10.3)
Chloride: 93 mmol/L — ABNORMAL LOW (ref 98–111)
Creatinine, Ser: 1.84 mg/dL — ABNORMAL HIGH (ref 0.61–1.24)
GFR, Estimated: 42 mL/min — ABNORMAL LOW (ref >60)
Glucose, Bld: 366 mg/dL — ABNORMAL HIGH (ref 70–99)
Potassium: 4 mmol/L (ref 3.5–5.1)
Sodium: 128 mmol/L — ABNORMAL LOW (ref 135–145)

## 2024-03-25 LAB — I-STAT CHEM 8, ED
BUN: 16 mg/dL (ref 6–20)
Calcium, Ion: 1.03 mmol/L — ABNORMAL LOW (ref 1.15–1.40)
Chloride: 94 mmol/L — ABNORMAL LOW (ref 98–111)
Creatinine, Ser: 1.9 mg/dL — ABNORMAL HIGH (ref 0.61–1.24)
Glucose, Bld: 370 mg/dL — ABNORMAL HIGH (ref 70–99)
HCT: 36 % — ABNORMAL LOW (ref 39.0–52.0)
Hemoglobin: 12.2 g/dL — ABNORMAL LOW (ref 13.0–17.0)
Potassium: 3.9 mmol/L (ref 3.5–5.1)
Sodium: 128 mmol/L — ABNORMAL LOW (ref 135–145)
TCO2: 21 mmol/L — ABNORMAL LOW (ref 22–32)

## 2024-03-25 LAB — CBG MONITORING, ED
Glucose-Capillary: 350 mg/dL — ABNORMAL HIGH (ref 70–99)
Glucose-Capillary: 409 mg/dL — ABNORMAL HIGH (ref 70–99)

## 2024-03-25 LAB — I-STAT CG4 LACTIC ACID, ED: Lactic Acid, Venous: 1.4 mmol/L (ref 0.5–1.9)

## 2024-03-25 LAB — GLUCOSE, CAPILLARY: Glucose-Capillary: 321 mg/dL — ABNORMAL HIGH (ref 70–99)

## 2024-03-25 LAB — C-REACTIVE PROTEIN: CRP: 23.8 mg/dL — ABNORMAL HIGH (ref <1.0)

## 2024-03-25 MED ORDER — MORPHINE SULFATE (PF) 4 MG/ML IV SOLN
4.0000 mg | Freq: Once | INTRAVENOUS | Status: AC
  Administered 2024-03-25: 4 mg via INTRAVENOUS
  Filled 2024-03-25: qty 1

## 2024-03-25 MED ORDER — VANCOMYCIN HCL 2000 MG/400ML IV SOLN
2000.0000 mg | Freq: Once | INTRAVENOUS | Status: AC
  Administered 2024-03-25: 2000 mg via INTRAVENOUS
  Filled 2024-03-25: qty 400

## 2024-03-25 MED ORDER — ACETAMINOPHEN 650 MG RE SUPP: 650.0000 mg | Freq: Four times a day (QID) | RECTAL | Status: DC | PRN

## 2024-03-25 MED ORDER — LACTATED RINGERS IV BOLUS
1000.0000 mL | Freq: Once | INTRAVENOUS | Status: AC
  Administered 2024-03-25: 1000 mL via INTRAVENOUS

## 2024-03-25 MED ORDER — ACETAMINOPHEN 325 MG PO TABS: 650.0000 mg | ORAL_TABLET | Freq: Four times a day (QID) | ORAL | Status: DC | PRN

## 2024-03-25 MED ORDER — INSULIN NPH (HUMAN) (ISOPHANE) 100 UNIT/ML ~~LOC~~ SUSP
22.0000 [IU] | Freq: Two times a day (BID) | SUBCUTANEOUS | Status: DC
  Administered 2024-03-25: 22 [IU] via SUBCUTANEOUS
  Filled 2024-03-25 (×2): qty 10

## 2024-03-25 MED ORDER — INSULIN GLARGINE-YFGN 100 UNIT/ML ~~LOC~~ SOLN
20.0000 [IU] | Freq: Every day | SUBCUTANEOUS | Status: DC
Start: 2024-03-25 — End: 2024-03-25
  Filled 2024-03-25: qty 0.2

## 2024-03-25 MED ORDER — INSULIN ASPART 100 UNIT/ML IJ SOLN
5.0000 [IU] | Freq: Once | INTRAMUSCULAR | Status: AC
  Administered 2024-03-25: 5 [IU] via SUBCUTANEOUS

## 2024-03-25 MED ORDER — ENOXAPARIN SODIUM 40 MG/0.4ML IJ SOSY
40.0000 mg | PREFILLED_SYRINGE | INTRAMUSCULAR | Status: DC
  Filled 2024-03-25: qty 0.4

## 2024-03-25 MED ORDER — SODIUM CHLORIDE 0.9 % IV SOLN: 3.0000 g | Freq: Once | INTRAVENOUS | Status: DC

## 2024-03-25 MED ORDER — PIPERACILLIN-TAZOBACTAM 3.375 G IVPB
3.3750 g | Freq: Three times a day (TID) | INTRAVENOUS | Status: DC
  Administered 2024-03-26 – 2024-03-27 (×4): 3.375 g via INTRAVENOUS
  Filled 2024-03-25 (×4): qty 50

## 2024-03-25 MED ORDER — PIPERACILLIN-TAZOBACTAM 3.375 G IVPB 30 MIN
3.3750 g | Freq: Once | INTRAVENOUS | Status: AC
  Administered 2024-03-25: 3.375 g via INTRAVENOUS
  Filled 2024-03-25: qty 50

## 2024-03-25 MED ORDER — INSULIN ASPART 100 UNIT/ML IJ SOLN
0.0000 [IU] | Freq: Three times a day (TID) | INTRAMUSCULAR | Status: DC
  Administered 2024-03-26: 2 [IU] via SUBCUTANEOUS

## 2024-03-25 MED ORDER — VANCOMYCIN HCL IN DEXTROSE 1-5 GM/200ML-% IV SOLN
1000.0000 mg | INTRAVENOUS | Status: DC
  Administered 2024-03-26: 1000 mg via INTRAVENOUS
  Filled 2024-03-25: qty 200

## 2024-03-25 MED ORDER — SODIUM CHLORIDE 0.9 % IV SOLN
3.0000 g | Freq: Once | INTRAVENOUS | Status: AC
  Administered 2024-03-25: 3 g via INTRAVENOUS
  Filled 2024-03-25: qty 8

## 2024-03-25 MED ORDER — OXYCODONE-ACETAMINOPHEN 5-325 MG PO TABS: 1.0000 | ORAL_TABLET | Freq: Once | ORAL | Status: DC

## 2024-03-25 MED ORDER — VANCOMYCIN HCL IN DEXTROSE 1-5 GM/200ML-% IV SOLN: 1000.0000 mg | Freq: Once | INTRAVENOUS | Status: DC

## 2024-03-25 MED ORDER — INSULIN ASPART 100 UNIT/ML IJ SOLN
0.0000 [IU] | Freq: Every day | INTRAMUSCULAR | Status: DC
  Administered 2024-03-25: 4 [IU] via SUBCUTANEOUS

## 2024-03-25 MED ORDER — SENNOSIDES-DOCUSATE SODIUM 8.6-50 MG PO TABS
2.0000 | ORAL_TABLET | Freq: Two times a day (BID) | ORAL | Status: DC
  Administered 2024-03-25 – 2024-03-28 (×6): 2 via ORAL
  Filled 2024-03-25 (×6): qty 2

## 2024-03-25 MED ORDER — SODIUM CHLORIDE 0.9% FLUSH
3.0000 mL | Freq: Two times a day (BID) | INTRAVENOUS | Status: DC
Start: 2024-03-25 — End: 2024-03-28
  Administered 2024-03-25 – 2024-03-28 (×6): 3 mL via INTRAVENOUS

## 2024-03-25 MED ORDER — POLYETHYLENE GLYCOL 3350 17 G PO PACK: 17.0000 g | PACK | Freq: Every day | ORAL | Status: DC | PRN

## 2024-03-25 MED ORDER — OXYCODONE HCL 5 MG PO TABS
5.0000 mg | ORAL_TABLET | ORAL | Status: DC | PRN
  Administered 2024-03-25 – 2024-03-27 (×5): 5 mg via ORAL
  Filled 2024-03-25 (×5): qty 1

## 2024-03-25 NOTE — Assessment & Plan Note (Signed)
 This is a chronic diagnosis patient has not been taking any medications at home.  This time patient blood pressure in the ER has been actually reasonably well-controlled with some diastolic values less than 60.  Therefore I will not restart any antihypertensive regimen.  Consideration of starting lisinopril once the patient is out of his acute illness.

## 2024-03-25 NOTE — ED Triage Notes (Signed)
 Pt. Stated, [name] not had my Insulin  in a month and Im so weak . The pharmacy has not been doing what they are suppose to do. I have a wound on the left foot. And my right leg is hurting so bad, I really need to lay down so it doesn't hurt.

## 2024-03-25 NOTE — ED Provider Triage Note (Signed)
 Emergency Medicine Provider Triage Evaluation Note  [name] , a 58 y.o. male  was evaluated in triage.  Pt complains of hyperglycemia in the setting of not having his insulin  for a month due to mixup with PCP/pharmacy.  Has chronic wound to his left leg, has had some pain to his right leg over the past day or 2.  Feels dehydrated.  Review of Systems  Positive: Hyperglycemia Negative: Fevers, CP   Physical Exam  BP 90/78 (BP Location: Right Arm)   Pulse 97   Temp 98.8 F (37.1 C)   Resp 18   Ht 5\' 10"  (1.778 m)   Wt 99.8 kg   SpO2 100%   BMI 31.57 kg/m  Gen:   Awake, no distress   Resp:  Normal effort  MSK:   Moves extremities without difficulty. RLE bandaged. LLE with soft compartments. Equal pulses. No appreciable swelling Other:  No gross motor deficits  Medical Decision Making  Medically screening exam initiated at 10:21 AM.  Appropriate orders placed.  Eliav Mechling was informed that the remainder of the evaluation will be completed by another provider, this initial triage assessment does not replace that evaluation, and the importance of remaining in the ED until their evaluation is complete.  Hypergycemia 2/2 medication noncompliance due to insurance/PCP/pharmacy problems. Chronic RLE wound; follows with podiatry. LLE pain I suspect MSK and 2/2 compensation from RLE wound. Will get labs to evaluate for DKA.    Rolinda Climes, DO 03/25/24 1021

## 2024-03-25 NOTE — Progress Notes (Signed)
 Ortho Note  Received consult for diabetic foot wound/ possible necrotizing fascitis. Patient will need formal debridement and likely first ray amputation tomorrow. Will recommend broad spectrum antibiotics and admission to hospitalist. Patient is hemodynamically stable with normal blood pressure and HR. Will discuss with Dr. Julio Ohm for his expertise. Please make NPO after midnight.  Laneta Pintos, MD [facility] [phone] (office) [email or web address]

## 2024-03-25 NOTE — Assessment & Plan Note (Signed)
 Also chornic on the left. I insepcted the wound. At this time no cocnern of acute soft tissue infection. Chronic fidning noted on xray :  "Cortical erosive change involving sesamoid bones at the head of first metatarsal suspicious for osteomyelitis. 2. Probable wound or ulceration at the level of the distal fifth metatarsal laterally. Interval thick periosteal reaction at the fifth metatarsal with small lucent defect at the neck of the fifth metatarsal possible chronic osteomyelitis. This could be further assessed with MRI 3. Deformity at the second third and fourth MTP joints, probably due to sequela of previously noted septic arthritis. 4. Linear wire like density on the frontal views at the level of third and fourth MTP joints is indeterminate for soft tissue foreign body."  I will get CT t.r.o foreign body. Dressing ordered.

## 2024-03-25 NOTE — ED Provider Notes (Signed)
 [facility] Provider Note   CSN: [id number] Arrival date & time: 03/25/24  0941     History  Chief Complaint  Patient presents with   Hyperglycemia   Leg Pain   Wound Check    [name] is a 58 y.o. male.  Patient is a 58 year old male with a past medical history of hypertension, diabetes, diabetic neuropathy with left foot wound presenting to the emergency department with right foot pain and hyperglycemia.  Patient reports that he has been out of his insulin  for the last month.  He states that he has been trying to talk with his primary doctor but has been having issues getting the prescription sent to his pharmacy.  The patient also reports for the last 2 days he has had pain in his right foot.  He states that he just realized today that he has significant bruising to his right foot.  He denies any fever.  He states that he also has a known wound to his left foot.  He states that he has been doing dressing changes at home and it has improved since his last surgery on the foot.  The history is provided by the patient.  Hyperglycemia Leg Pain Wound Check       Home Medications Prior to Admission medications   Medication Sig Start Date End Date Taking? Authorizing Provider  amLODipine  (NORVASC ) 5 MG tablet Take 1 tablet (5 mg total) by mouth daily. 12/12/22   Newlin, Enobong, MD  amoxicillin -clavulanate (AUGMENTIN ) 875-125 MG tablet Take 1 tablet by mouth 2 (two) times daily. 08/20/23   [provider]  atorvastatin  (LIPITOR) 20 MG tablet Take 1 tablet (20 mg total) by mouth daily. 12/12/22   Newlin, Enobong, MD  Continuous Blood Gluc Transmit (DEXCOM G6 TRANSMITTER) MISC Use to check blood sugar three times daily. Change transmitter once every 909 days. E11.69 06/05/22   Newlin, Enobong, MD  gabapentin  (NEURONTIN ) 300 MG capsule Take 1 capsule (300 mg total) by mouth at bedtime. 09/11/22   Newlin, Enobong, MD  Insulin  NPH, Human,,  Isophane, (HUMULIN  N KWIKPEN) 100 UNIT/ML Kiwkpen Inject 22 Units into the skin 2 (two) times daily. 12/12/22   Newlin, Enobong, MD  Insulin  Pen Needle 32G X 4 MM MISC Use as directed to inject insulin  up to 4 times daily. 11/21/22   Newlin, Enobong, MD  metFORMIN  (GLUCOPHAGE -XR) 500 MG 24 hr tablet Take 2 tablets (1,000 mg total) by mouth 2 (two) times daily with a meal. 12/12/22   Joaquin Mulberry, MD  methocarbamol  (ROBAXIN ) 500 MG tablet Take 1 tablet (500 mg total) by mouth every 6 (six) hours as needed for muscle spasms. 07/10/22   Timothy Ford, MD      Allergies    Patient has no known allergies.    Review of Systems   Review of Systems  Physical Exam Updated Vital Signs BP (!) 164/85   Pulse 88   Temp 98.4 F (36.9 C)   Resp 16   Ht 5\' 10"  (1.778 m)   Wt 99.8 kg   SpO2 99%   BMI 31.57 kg/m  Physical Exam Vitals and nursing note reviewed.  Constitutional:      General: He is not in acute distress.    Appearance: Normal appearance.  HENT:     Head: Normocephalic and atraumatic.     Nose: Nose normal.     Mouth/Throat:     Mouth: Mucous membranes are moist.  Eyes:  Extraocular Movements: Extraocular movements intact.     Conjunctiva/sclera: Conjunctivae normal.  Cardiovascular:     Rate and Rhythm: Normal rate and regular rhythm.     Pulses: Normal pulses.     Heart sounds: Normal heart sounds.  Pulmonary:     Effort: Pulmonary effort is normal.     Breath sounds: Normal breath sounds.  Abdominal:     General: Abdomen is flat.     Palpations: Abdomen is soft.     Tenderness: There is no abdominal tenderness.  Musculoskeletal:        General: Normal range of motion.     Cervical back: Normal range of motion.  Skin:    General: Skin is warm and dry.     Comments: Bruising with small blistering with erythema to right 1st toes with erythema and warmth to the foot with significant tenderness palpation ~4 cm ulceration to bottom of L foot, no active drainage, no  surrounding erythema or warmth ~1.5 cm ulceration to lateral aspect of dorsum of L foot without drainage, surrounding erythema or warmth  Neurological:     General: No focal deficit present.     Mental Status: He is alert and oriented to person, place, and time.  Psychiatric:        Mood and Affect: Mood normal.        Behavior: Behavior normal.          ED Results / Procedures / Treatments   Labs (all labs ordered are listed, but only abnormal results are displayed) Labs Reviewed  CBC - Abnormal; Notable for the following components:      Result Value   WBC 18.0 (*)    RBC 4.08 (*)    Hemoglobin 10.8 (*)    HCT 33.7 (*)    All other components within normal limits  BASIC METABOLIC PANEL WITH GFR - Abnormal; Notable for the following components:   Sodium 128 (*)    Chloride 93 (*)    CO2 21 (*)    Glucose, Bld 366 (*)    Creatinine, Ser 1.84 (*)    Calcium  8.2 (*)    GFR, Estimated 42 (*)    All other components within normal limits  CBG MONITORING, ED - Abnormal; Notable for the following components:   Glucose-Capillary 350 (*)    All other components within normal limits  CBG MONITORING, ED - Abnormal; Notable for the following components:   Glucose-Capillary 409 (*)    All other components within normal limits  I-STAT VENOUS BLOOD GAS, ED - Abnormal; Notable for the following components:   pCO2, Ven 38.2 (*)    pO2, Ven 30 (*)    Sodium 129 (*)    Calcium , Ion 1.05 (*)    HCT 35.0 (*)    Hemoglobin 11.9 (*)    All other components within normal limits  I-STAT CHEM 8, ED - Abnormal; Notable for the following components:   Sodium 128 (*)    Chloride 94 (*)    Creatinine, Ser 1.90 (*)    Glucose, Bld 370 (*)    Calcium , Ion 1.03 (*)    TCO2 21 (*)    Hemoglobin 12.2 (*)    HCT 36.0 (*)    All other components within normal limits  CULTURE, BLOOD (ROUTINE X 2)  CULTURE, BLOOD (ROUTINE X 2)  URINALYSIS, ROUTINE W REFLEX MICROSCOPIC  C-REACTIVE PROTEIN   CBG MONITORING, ED  CBG MONITORING, ED  I-STAT CG4 LACTIC ACID, ED  EKG None  Radiology DG Foot Complete Right Result Date: 03/25/2024 CLINICAL DATA:  Wound EXAM: RIGHT FOOT COMPLETE - 3+ VIEW COMPARISON:  None Available. FINDINGS: No fracture or malalignment. Moderate plantar calcaneal spur and posterior enthesophyte. Vascular calcifications. No fracture or malalignment. No osseous destructive change. Findings suspicious for small volume soft tissue gas at the level of the first proximal phalanx and MTP joint. IMPRESSION: Findings suspicious for small volume soft tissue gas at the level of the first proximal phalanx and MTP joint, raising concern for necrotic infection. No osseous destructive change. Electronically Signed   By: Esmeralda Hedge M.D.   On: 03/25/2024 17:00   DG Foot Complete Left Result Date: 03/25/2024 CLINICAL DATA:  Wound multiple EXAM: LEFT FOOT - COMPLETE 3+ VIEW COMPARISON:  05/04/2022, MRI 06/29/2022 FINDINGS: No acute fracture is seen. Large wound or ulcer plantar aspect of foot at the level of the MTP joints. Cortical erosive change involving sesamoid bones at the head of first metatarsal suspicious for osteomyelitis. There is probable wound or ulceration at the level of the distal fifth metatarsal laterally. Interval thick periosteal reaction at the fifth metatarsal. Small lucent defect at the neck of the fifth metatarsal. Deformity at the second third and fourth MTP joints, probably due to sequela of previously noted septic arthritis. Linear wire like density on the frontal views at the level of third and fourth MTP joints is indeterminate for soft tissue foreign body. IMPRESSION: 1. Large wound or ulcer plantar aspect of foot at the level of the MTP joints. Cortical erosive change involving sesamoid bones at the head of first metatarsal suspicious for osteomyelitis. 2. Probable wound or ulceration at the level of the distal fifth metatarsal laterally. Interval thick  periosteal reaction at the fifth metatarsal with small lucent defect at the neck of the fifth metatarsal possible chronic osteomyelitis. This could be further assessed with MRI 3. Deformity at the second third and fourth MTP joints, probably due to sequela of previously noted septic arthritis. 4. Linear wire like density on the frontal views at the level of third and fourth MTP joints is indeterminate for soft tissue foreign body. Electronically Signed   By: Esmeralda Hedge M.D.   On: 03/25/2024 16:58    Procedures .Critical Care  Performed by: Kingsley, Eliud Polo K, DO Authorized by: Nolberto Batty, DO   Critical care provider statement:    Critical care time (minutes):  30   Critical care was necessary to treat or prevent imminent or life-threatening deterioration of the following conditions:  Sepsis   Critical care was time spent personally by me on the following activities:  Development of treatment plan with patient or surrogate, discussions with consultants, evaluation of patient's response to treatment, examination of patient, ordering and review of laboratory studies, ordering and review of radiographic studies, ordering and performing treatments and interventions, pulse oximetry, re-evaluation of patient's condition and review of old charts     Medications Ordered in ED Medications  Ampicillin -Sulbactam (UNASYN ) 3 g in sodium chloride  0.9 % 100 mL IVPB (3 g Intravenous New Bag/Given 03/25/24 1757)    And  vancomycin  (VANCOREADY) IVPB 2000 mg/400 mL (has no administration in time range)  lactated ringers  bolus 1,000 mL (1,000 mLs Intravenous New Bag/Given 03/25/24 1757)  insulin  aspart (novoLOG ) injection 5 Units (5 Units Subcutaneous Given 03/25/24 1759)  morphine  (PF) 4 MG/ML injection 4 mg (4 mg Intravenous Given 03/25/24 1751)    ED Course/ Medical Decision Making/ A&P Clinical Course as of 03/25/24 1809  Tue Mar 25, 2024  1711 Gas in the soft tissue of the R foot concerning  for necrotic infection, possible osteomyelitis on the L foot. Will order antibiotics, cultures and lactic and consult patient's podiatrist.  [VK]  1720 I spoke with Dr. Rosemarie Conquest with podiatry. Asked to make the patient NPO and will evaluate need for potential surgery tonight. [VK]  1743 I spoke with Dr. Rosemarie Conquest who recommended adding on CRP and consult to orthopedics vs gen surg for emergent I&D. Since this is a new issue and has only seen podiatry once feels more appropriate for ortho/gen surg. [VK]  1752 I spoke with Dr. Curtiss Dowdy with orthopedics who recommended hospitalist admission and NPO at midnight. Erythema marked on R foot with skin marker. [VK]    Clinical Course User Index [VK] Kingsley, Celeste Tavenner K, DO                                 Medical Decision Making This patient presents to the ED with chief complaint(s) of foot pain, hyperglycemia with pertinent past medical history of DM, HTN, neuropathy which further complicates the presenting complaint. The complaint involves an extensive differential diagnosis and also carries with it a high risk of complications and morbidity.    The differential diagnosis includes fracture, dislocation, osteomyelitis, cellulitis, other deep space infection, sepsis, hyperglycemic crisis, DKA  Additional history obtained: Additional history obtained from n/a Records reviewed outpatient podiatry records  ED Course and Reassessment: On patient's arrival he is hemodynamically stable no acute distress.  Was initially evaluated by provider in triage and had labs performed.  Labs showed hyperglycemia with glucose of 366 and mild increased creatinine of 1.8 from baseline, he has a white count of 18.  No signs of DKA.  The patient will be started on fluids and given insulin  for his hyperglycemia.  The patient does have significant bruising, erythema and warmth to the right foot and a large wound that does not appear acutely infected on the left foot.  Will have  x-rays performed to evaluate for any traumatic injury or deep space infection.  Patient was given pain control and will be closely reassessed.  Independent labs interpretation:  The following labs were independently interpreted: hyperglycemia without DKA, leukocytosis, mild AKI on CKD  Independent visualization of imaging: - I independently visualized the following imaging with scope of interpretation limited to determining acute life threatening conditions related to emergency care: Bilateral foot XR, which revealed wound to dorsum of L foot with possible osteo, gas in soft tissue of R 1st phalanx concerning for possible necrotizing infection  Consultation: - Consulted or discussed management/test interpretation w/ external professional: podiatry, orthopedics, hospitalist  Consideration for admission or further workup: patient requires admission for possible necrotizing infection of R toe Social Determinants of health: N/A    Amount and/or Complexity of Data Reviewed Labs: ordered. Radiology: ordered.  Risk Prescription drug management. Decision regarding hospitalization.          Final Clinical Impression(s) / ED Diagnoses Final diagnoses:  Necrotizing soft tissue infection  Toe infection  Hyperglycemia    Rx / DC Orders ED Discharge Orders     None         Kingsley, Curtiss Mahmood K, DO 03/25/24 1809

## 2024-03-25 NOTE — H&P (Signed)
 History and Physical    Patient: [name] JYN:[id number] DOB: 1965/12/29 DOA: 03/25/2024 DOS: the patient was seen and examined on 03/25/2024 PCP: Joaquin Mulberry, MD  Patient coming from: Home  Chief Complaint:  Chief Complaint  Patient presents with   Hyperglycemia   Leg Pain   Wound Check   HPI: [name] is a 58 y.o. male with medical history significant of DM. However, for one reason or another,  patinet is actually not taking any meds at thsi time for at least a few weeks. Patinet also has a chronic left foot wound planter that has been improving under podaitry care - see photo documentation in chart.  Patient was in his usual state of health till about 3 to 4 days ago when he noted an insidious onset of darkening swelling and erythema of the right great toe and the adjecent area of the foot. A.w. increasing pain.  Patient has also developed a discharge from the intertriginous area in between the toes and along the nail line.  Patient finally came to the ER today for further evaluation.  Patient does not report any fevers or rigors.   Xray : suspicious for small volume soft tissue gas at the level of the first proximal phalanx and MTP joint,  S.p vanco+unasyn . Patient also noted to be hyperglycemic in the ER 400s. S.p insulin  5 units.   Review of Systems: As mentioned in the history of present illness. All other systems reviewed and are negative. Past Medical History:  Diagnosis Date   Diabetes mellitus without complication (HCC)    Tuberculosis    tested positive,treated with medications.   Past Surgical History:  Procedure Laterality Date   FOOT SURGERY Left    I & D EXTREMITY Left 05/05/2022   Procedure: LEFT FOOT DEBRIDEMENT;  Surgeon: Timothy Ford, MD;  Location: [facility] OR;  Service: Orthopedics;  Laterality: Left;   MYRINGOTOMY WITH TUBE PLACEMENT Bilateral 07/20/2022   Procedure: MYRINGOTOMY WITH TUBE PLACEMENT;  Surgeon: Juengel, Paul, MD;  Location: [facility];  Service: ENT;  Laterality: Bilateral;  Diabetic   TONSILLECTOMY     Social History:  reports that he quit smoking about 18 months ago. His smoking use included cigarettes. He started smoking about 26 years ago. He has a 6.3 pack-year smoking history. He has never used smokeless tobacco. He reports that he does not drink alcohol and does not use drugs.  No Known Allergies  Family History  Problem Relation Age of Onset   Colon cancer Neg Hx    Colon polyps Neg Hx    Crohn's disease Neg Hx    Esophageal cancer Neg Hx    Rectal cancer Neg Hx    Stomach cancer Neg Hx    Ulcerative colitis Neg Hx     Prior to Admission medications   Medication Sig Start Date End Date Taking? Authorizing Provider  amLODipine  (NORVASC ) 5 MG tablet Take 1 tablet (5 mg total) by mouth daily. 12/12/22   Newlin, Enobong, MD  amoxicillin -clavulanate (AUGMENTIN ) 875-125 MG tablet Take 1 tablet by mouth 2 (two) times daily. 08/20/23   [provider]  atorvastatin  (LIPITOR) 20 MG tablet Take 1 tablet (20 mg total) by mouth daily. 12/12/22   Newlin, Enobong, MD  Continuous Blood Gluc Transmit (DEXCOM G6 TRANSMITTER) MISC Use to check blood sugar three times daily. Change transmitter once every 909 days. E11.69 06/05/22   Newlin, Enobong, MD  gabapentin  (NEURONTIN ) 300 MG capsule Take 1 capsule (300 mg total)  by mouth at bedtime. 09/11/22   Newlin, Enobong, MD  Insulin  NPH, Human,, Isophane, (HUMULIN  N KWIKPEN) 100 UNIT/ML Kiwkpen Inject 22 Units into the skin 2 (two) times daily. 12/12/22   Newlin, Enobong, MD  Insulin  Pen Needle 32G X 4 MM MISC Use as directed to inject insulin  up to 4 times daily. 11/21/22   Newlin, Enobong, MD  metFORMIN  (GLUCOPHAGE -XR) 500 MG 24 hr tablet Take 2 tablets (1,000 mg total) by mouth 2 (two) times daily with a meal. 12/12/22   Joaquin Mulberry, MD  methocarbamol  (ROBAXIN ) 500 MG tablet Take 1 tablet (500 mg total) by mouth every 6 (six) hours as needed for muscle spasms. 07/10/22   Timothy Ford, MD    Physical Exam: Vitals:   03/25/24 1235 03/25/24 1605 03/25/24 1645 03/25/24 1800  BP:  (!) 125/95 (!) 164/85 (!) 144/65  Pulse:  94 88 89  Resp:  17 16 20   Temp: 98.7 F (37.1 C) 98.4 F (36.9 C)    TempSrc: Oral     SpO2:  100% 99% 100%  Weight:      Height:       General: Patient is alert and awake, appears to be in no distress.  Gives a coherent account of his symptoms Respiratory exam: Bilateral intravesicular Cardiovascular exam S1-S2 normal Abdomen all quadrants are soft nontender Extremities warm: Left lower extremity shows no edema, there is a plantar foot wound, see photo representation.   Media Information   Document Information  Photos    03/25/2024 16:10  Attached To:  Hospital Encounter on 03/25/24  Source Information  Kingsley, Victoria K, DO  [facility]  Document History   There is no discharge or foul smell at this time.  Looking back at images from previous, this seems to be much improved over the last several months.   Media Information   Document Information  Photos    03/25/2024 16:11  Attached To:  Hospital Encounter on 03/25/24  Source Information  Kingsley, Victoria K, DO  [facility]  Document History   On the right side, the great toe appears swollen, wet, dark, with proximal edema of the foot.  There is bloody scant discharge from the intertriginous area and along the toe nail.  Function is preserved to at least to some extent.    Data Reviewed:  Labs on Admission:  Results for orders placed or performed during the hospital encounter of 03/25/24 (from the past 24 hours)  CBG monitoring, ED     Status: Abnormal   Collection Time: 03/25/24  9:54 AM  Result Value Ref Range   Glucose-Capillary 350 (H) 70 - 99 mg/dL  CBC     Status: Abnormal   Collection Time: 03/25/24 10:29 AM  Result Value Ref Range   WBC 18.0 (H) 4.0 - 10.5 K/uL   RBC 4.08 (L) 4.22 - 5.81 MIL/uL   Hemoglobin 10.8 (L) 13.0 - 17.0  g/dL   HCT 82.9 (L) 56.2 - 13.0 %   MCV 82.6 80.0 - 100.0 fL   MCH 26.5 26.0 - 34.0 pg   MCHC 32.0 30.0 - 36.0 g/dL   RDW 86.5 78.4 - 69.6 %   Platelets 340 150 - 400 K/uL   nRBC 0.0 0.0 - 0.2 %  Basic metabolic panel     Status: Abnormal   Collection Time: 03/25/24 10:29 AM  Result Value Ref Range   Sodium 128 (L) 135 - 145 mmol/L   Potassium 4.0 3.5 - 5.1 mmol/L  Chloride 93 (L) 98 - 111 mmol/L   CO2 21 (L) 22 - 32 mmol/L   Glucose, Bld 366 (H) 70 - 99 mg/dL   BUN 14 6 - 20 mg/dL   Creatinine, Ser 4.01 (H) 0.61 - 1.24 mg/dL   Calcium  8.2 (L) 8.9 - 10.3 mg/dL   GFR, Estimated 42 (L) >60 mL/min   Anion gap 14 5 - 15  I-stat chem 8, ed     Status: Abnormal   Collection Time: 03/25/24 10:36 AM  Result Value Ref Range   Sodium 128 (L) 135 - 145 mmol/L   Potassium 3.9 3.5 - 5.1 mmol/L   Chloride 94 (L) 98 - 111 mmol/L   BUN 16 6 - 20 mg/dL   Creatinine, Ser 0.27 (H) 0.61 - 1.24 mg/dL   Glucose, Bld 253 (H) 70 - 99 mg/dL   Calcium , Ion 1.03 (L) 1.15 - 1.40 mmol/L   TCO2 21 (L) 22 - 32 mmol/L   Hemoglobin 12.2 (L) 13.0 - 17.0 g/dL   HCT 66.4 (L) 40.3 - 47.4 %  I-Stat venous blood gas, ([facility], [facility], [facility])     Status: Abnormal   Collection Time: 03/25/24 10:39 AM  Result Value Ref Range   pH, Ven 7.417 7.25 - 7.43   pCO2, Ven 38.2 (L) 44 - 60 mmHg   pO2, Ven 30 (LL) 32 - 45 mmHg   Bicarbonate 24.6 20.0 - 28.0 mmol/L   TCO2 26 22 - 32 mmol/L   O2 Saturation 58 %   Acid-Base Excess 0.0 0.0 - 2.0 mmol/L   Sodium 129 (L) 135 - 145 mmol/L   Potassium 3.9 3.5 - 5.1 mmol/L   Calcium , Ion 1.05 (L) 1.15 - 1.40 mmol/L   HCT 35.0 (L) 39.0 - 52.0 %   Hemoglobin 11.9 (L) 13.0 - 17.0 g/dL   Sample type VENOUS    Comment NOTIFIED PHYSICIAN   CBG monitoring, ED     Status: Abnormal   Collection Time: 03/25/24  5:37 PM  Result Value Ref Range   Glucose-Capillary 409 (H) 70 - 99 mg/dL  C-reactive protein     Status: Abnormal   Collection Time: 03/25/24  5:50 PM  Result Value Ref Range    CRP 23.8 (H) <1.0 mg/dL  I-Stat Lactic Acid     Status: None   Collection Time: 03/25/24  6:03 PM  Result Value Ref Range   Lactic Acid, Venous 1.4 0.5 - 1.9 mmol/L  Urinalysis, Routine w reflex microscopic -Urine, Clean Catch     Status: Abnormal   Collection Time: 03/25/24  6:42 PM  Result Value Ref Range   Color, Urine AMBER (A) YELLOW   APPearance CLOUDY (A) CLEAR   Specific Gravity, Urine 1.015 1.005 - 1.030   pH 5.0 5.0 - 8.0   Glucose, UA >=500 (A) NEGATIVE mg/dL   Hgb urine dipstick SMALL (A) NEGATIVE   Bilirubin Urine NEGATIVE NEGATIVE   Ketones, ur 5 (A) NEGATIVE mg/dL   Protein, ur 259 (A) NEGATIVE mg/dL   Nitrite NEGATIVE NEGATIVE   Leukocytes,Ua NEGATIVE NEGATIVE   RBC / HPF 0-5 0 - 5 RBC/hpf   WBC, UA 11-20 0 - 5 WBC/hpf   Bacteria, UA RARE (A) NONE SEEN   Squamous Epithelial / HPF 0-5 0 - 5 /HPF   WBC Clumps PRESENT    Mucus PRESENT    Hyaline Casts, UA PRESENT    Granular Casts, UA PRESENT    Basic Metabolic Panel: Recent Labs  Lab 03/25/24 1029  03/25/24 1036 03/25/24 1039  NA 128* 128* 129*  K 4.0 3.9 3.9  CL 93* 94*  --   CO2 21*  --   --   GLUCOSE 366* 370*  --   BUN 14 16  --   CREATININE 1.84* 1.90*  --   CALCIUM  8.2*  --   --    Liver Function Tests: No results for input(s): "AST", "ALT", "ALKPHOS", "BILITOT", "PROT", "ALBUMIN" in the last 168 hours. No results for input(s): "LIPASE", "AMYLASE" in the last 168 hours. No results for input(s): "AMMONIA" in the last 168 hours. CBC: Recent Labs  Lab 03/25/24 1029 03/25/24 1036 03/25/24 1039  WBC 18.0*  --   --   HGB 10.8* 12.2* 11.9*  HCT 33.7* 36.0* 35.0*  MCV 82.6  --   --   PLT 340  --   --    Cardiac Enzymes: No results for input(s): "CKTOTAL", "CKMB", "CKMBINDEX", "TROPONINIHS" in the last 168 hours.  BNP (last 3 results) No results for input(s): "PROBNP" in the last 8760 hours. CBG: Recent Labs  Lab 03/25/24 0954 03/25/24 1737  GLUCAP 350* 409*    Radiological Exams on  Admission:  DG Foot Complete Right Result Date: 03/25/2024 CLINICAL DATA:  Wound EXAM: RIGHT FOOT COMPLETE - 3+ VIEW COMPARISON:  None Available. FINDINGS: No fracture or malalignment. Moderate plantar calcaneal spur and posterior enthesophyte. Vascular calcifications. No fracture or malalignment. No osseous destructive change. Findings suspicious for small volume soft tissue gas at the level of the first proximal phalanx and MTP joint. IMPRESSION: Findings suspicious for small volume soft tissue gas at the level of the first proximal phalanx and MTP joint, raising concern for necrotic infection. No osseous destructive change. Electronically Signed   By: Esmeralda Hedge M.D.   On: 03/25/2024 17:00   DG Foot Complete Left Result Date: 03/25/2024 CLINICAL DATA:  Wound multiple EXAM: LEFT FOOT - COMPLETE 3+ VIEW COMPARISON:  05/04/2022, MRI 06/29/2022 FINDINGS: No acute fracture is seen. Large wound or ulcer plantar aspect of foot at the level of the MTP joints. Cortical erosive change involving sesamoid bones at the head of first metatarsal suspicious for osteomyelitis. There is probable wound or ulceration at the level of the distal fifth metatarsal laterally. Interval thick periosteal reaction at the fifth metatarsal. Small lucent defect at the neck of the fifth metatarsal. Deformity at the second third and fourth MTP joints, probably due to sequela of previously noted septic arthritis. Linear wire like density on the frontal views at the level of third and fourth MTP joints is indeterminate for soft tissue foreign body. IMPRESSION: 1. Large wound or ulcer plantar aspect of foot at the level of the MTP joints. Cortical erosive change involving sesamoid bones at the head of first metatarsal suspicious for osteomyelitis. 2. Probable wound or ulceration at the level of the distal fifth metatarsal laterally. Interval thick periosteal reaction at the fifth metatarsal with small lucent defect at the neck of the fifth  metatarsal possible chronic osteomyelitis. This could be further assessed with MRI 3. Deformity at the second third and fourth MTP joints, probably due to sequela of previously noted septic arthritis. 4. Linear wire like density on the frontal views at the level of third and fourth MTP joints is indeterminate for soft tissue foreign body. Electronically Signed   By: Esmeralda Hedge M.D.   On: 03/25/2024 16:58     No intake/output data recorded. No intake/output data recorded.      \  Assessment  and Plan: * Diabetic foot infection (HCC) Patient seems to have a wet appearing gangrene of the RIGHT great toe with proximal inflammation/cellulitis.  S/p vancomycin  and Unasyn  in the ER, continue treatment with vancomycin  and Zosyn .  WBC count is noted to be 18.  Lactic elevation is within normal limits.  At this time I think this patient will likely need surgery tomorrow, therefore I will make the patient n.p.o. past midnight and Dr. Curtiss Dowdy of orthopedics has been engaged by the ER provider thank you.  I will defer cross-sectional imaging to our surgical colleagues as I think patient will need surgery most likely regardless of the finding on the cross-sectional images.  Pain: Oxycodone  ordered with senna docusate for GI prophylaxis.  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 I insepcted the wound. At this time no cocnern of acute soft tissue infection. Chronic fidning noted on xray :  "Cortical erosive change involving sesamoid bones at the head of first metatarsal suspicious for osteomyelitis. 2. Probable wound or ulceration at the level of the distal fifth metatarsal laterally. Interval thick periosteal reaction at the fifth metatarsal with small lucent defect at the neck of the fifth metatarsal possible chronic osteomyelitis. This could be further assessed with MRI 3. Deformity at the second third and fourth MTP joints, probably due to sequela of previously noted septic arthritis. 4. Linear  wire like density on the frontal views at the level of third and fourth MTP joints is indeterminate for soft tissue foreign body."  I will get CT t.r.o foreign body. Dressing ordered.  Type 2 diabetes mellitus (HCC) With hyperglycemia.  Patient has not been using his home insulin  for several weeks.  I will restart the patient NPH 22 units twice daily after discussion with patient.  And start the patient on insulin  sliding scale.  Patient already received IV fluid bolus in the ER.  Trend this.  No DKA.  Associated pseudohyponatremia.  Hypertension associated with diabetes [facility]) This is a chronic diagnosis patient has not been taking any medications at home.  This time patient blood pressure in the ER has been actually reasonably well-controlled with some diastolic values less than 60.  Therefore I will not restart any antihypertensive regimen.  Consideration of starting lisinopril once the patient is out of his acute illness.   Please review med rec after pharmacy input.    Advance Care Planning:   Code Status: Full Code   Consults: orthopedics. Dr. Curtiss Dowdy.  Family Communication: per pateint.  Severity of Illness: The appropriate patient status for this patient is INPATIENT. Inpatient status is judged to be reasonable and necessary in order to provide the required intensity of service to ensure the patient's safety. The patient's presenting symptoms, physical exam findings, and initial radiographic and laboratory data in the context of their chronic comorbidities is felt to place them at high risk for further clinical deterioration. Furthermore, it is not anticipated that the patient will be medically stable for discharge from the hospital within 2 midnights of admission.   * I certify that at the point of admission it is my clinical judgment that the patient will require inpatient hospital care spanning beyond 2 midnights from the point of admission due to high intensity of service, high risk  for further deterioration and high frequency of surveillance required.*  Author: Bennie Brave, MD 03/25/2024 7:48 PM  For on call review [email or web address].

## 2024-03-25 NOTE — ED Notes (Signed)
 Venous results given to travis young md by at

## 2024-03-25 NOTE — Progress Notes (Signed)
 Consulted for possible necrotizing infection right foot from the ED approx 5:30. Reviewed imaging and clinical picture which demonstrated concern for possible gas about the 1st MPJ and ecchymosis / mottled first ray with erythema streaking towards the ankle. After review of lab work including:  WBC 18 Hgb 10.8 CRP 23.8 Na 128 Cr 1.9 BG 370  Pt would have a calculated LRINEC score of 11  Per MD calc / Lezlie Reddish 2017 "High Risk for Necrotizing Soft Tissue Infection. Scores of 8 had a PPV of 93.4% for NSTIs. Work-up as above or emergent operative debridement."  Given patient had pain extending to his leg with streaking erythema I recommended orthopedics or general surgery consulation for emergent I&D as there is a high risk of necrotizing infection and I can not be sure that operative debridement would not carry me into or above the ankle which is proximal to my surgical scope of privilege at [facility].   I discussed the above with Dr. Curtiss Dowdy who agreed to take the consult.        Maridee Shoemaker, DPM [facility] / [facility]                   03/25/2024

## 2024-03-25 NOTE — Assessment & Plan Note (Addendum)
 Patient seems to have a wet appearing gangrene of the RIGHT great toe with proximal inflammation/cellulitis.  S/p vancomycin  and Unasyn  in the ER, continue treatment with vancomycin  and Zosyn .  WBC count is noted to be 18.  Lactic elevation is within normal limits.  At this time I think this patient will likely need surgery tomorrow, therefore I will make the patient n.p.o. past midnight and Dr. Curtiss Dowdy of orthopedics has been engaged by the ER provider thank you.  I will defer cross-sectional imaging to our surgical colleagues as I think patient will need surgery most likely regardless of the finding on the cross-sectional images.  Pain: Oxycodone  ordered with senna docusate for GI prophylaxis.

## 2024-03-25 NOTE — Progress Notes (Signed)
 Pharmacy Antibiotic Note  [name] is a 58 y.o. male admitted on 03/25/2024 presenting with DFI.  Pharmacy has been consulted for vancomycin  and zosyn  dosing.  2g Vancomycin  given in ED  Plan: Zosyn  3.375g IV q 8h Vancomycin  1g IV q 24h (eAUC 486) Monitor renal function, Cx, clinical progression and ortho recs Vancomycin  levels as indicated   Height: 5\' 10"  (177.8 cm) Weight: 99.8 kg (220 lb) IBW/kg (Calculated) : 73  Temp (24hrs), Avg:98.6 F (37 C), Min:98.4 F (36.9 C), Max:98.8 F (37.1 C)  Recent Labs  Lab 03/25/24 1029 03/25/24 1036 03/25/24 1803  WBC 18.0*  --   --   CREATININE 1.84* 1.90*  --   LATICACIDVEN  --   --  1.4    Estimated Creatinine Clearance: 50.8 mL/min (A) (by C-G formula based on SCr of 1.9 mg/dL (H)).    No Known Allergies  Trinidad Funk, PharmD, [facility] Clinical Pharmacist ED Pharmacist Phone # [phone] 03/25/2024 7:59 PM

## 2024-03-25 NOTE — Assessment & Plan Note (Addendum)
 With hyperglycemia.  Patient has not been using his home insulin  for several weeks.  I will restart the patient NPH 22 units twice daily after discussion with patient.  And start the patient on insulin  sliding scale.  Patient already received IV fluid bolus in the ER.  Trend this.  No DKA.  Associated pseudohyponatremia.

## 2024-03-26 ENCOUNTER — Telehealth: Payer: Self-pay | Admitting: Orthopedic Surgery

## 2024-03-26 ENCOUNTER — Inpatient Hospital Stay (HOSPITAL_COMMUNITY): Admitting: Anesthesiology

## 2024-03-26 ENCOUNTER — Encounter (HOSPITAL_COMMUNITY): Payer: Self-pay | Admitting: Internal Medicine

## 2024-03-26 ENCOUNTER — Encounter (HOSPITAL_COMMUNITY)
Admission: EM | Disposition: A | Discharge status: Home / Self Care | Payer: Self-pay | Source: Home / Self Care | Attending: Family Medicine

## 2024-03-26 DIAGNOSIS — E1169 Type 2 diabetes mellitus with other specified complication: Secondary | ICD-10-CM | POA: Diagnosis not present

## 2024-03-26 DIAGNOSIS — M869 Osteomyelitis, unspecified: Secondary | ICD-10-CM

## 2024-03-26 DIAGNOSIS — M7989 Other specified soft tissue disorders: Principal | ICD-10-CM

## 2024-03-26 DIAGNOSIS — E11628 Type 2 diabetes mellitus with other skin complications: Secondary | ICD-10-CM | POA: Diagnosis not present

## 2024-03-26 DIAGNOSIS — L089 Local infection of the skin and subcutaneous tissue, unspecified: Secondary | ICD-10-CM | POA: Diagnosis not present

## 2024-03-26 HISTORY — PX: AMPUTATION TOE: SHX6595

## 2024-03-26 LAB — CBC
HCT: 27.9 % — ABNORMAL LOW (ref 39.0–52.0)
Hemoglobin: 9.2 g/dL — ABNORMAL LOW (ref 13.0–17.0)
MCH: 26.4 pg (ref 26.0–34.0)
MCHC: 33 g/dL (ref 30.0–36.0)
MCV: 80.2 fL (ref 80.0–100.0)
Platelets: 285 10*3/uL (ref 150–400)
RBC: 3.48 MIL/uL — ABNORMAL LOW (ref 4.22–5.81)
RDW: 13.5 % (ref 11.5–15.5)
WBC: 15.6 10*3/uL — ABNORMAL HIGH (ref 4.0–10.5)
nRBC: 0 % (ref 0.0–0.2)

## 2024-03-26 LAB — BASIC METABOLIC PANEL WITH GFR
Anion gap: 11 (ref 5–15)
BUN: 23 mg/dL — ABNORMAL HIGH (ref 6–20)
CO2: 23 mmol/L (ref 22–32)
Calcium: 7.8 mg/dL — ABNORMAL LOW (ref 8.9–10.3)
Chloride: 97 mmol/L — ABNORMAL LOW (ref 98–111)
Creatinine, Ser: 2.14 mg/dL — ABNORMAL HIGH (ref 0.61–1.24)
GFR, Estimated: 35 mL/min — ABNORMAL LOW (ref >60)
Glucose, Bld: 166 mg/dL — ABNORMAL HIGH (ref 70–99)
Potassium: 3.3 mmol/L — ABNORMAL LOW (ref 3.5–5.1)
Sodium: 131 mmol/L — ABNORMAL LOW (ref 135–145)

## 2024-03-26 LAB — PROTIME-INR
INR: 1.2 (ref 0.8–1.2)
Prothrombin Time: 15.4 s — ABNORMAL HIGH (ref 11.4–15.2)

## 2024-03-26 LAB — GLUCOSE, CAPILLARY
Glucose-Capillary: 148 mg/dL — ABNORMAL HIGH (ref 70–99)
Glucose-Capillary: 149 mg/dL — ABNORMAL HIGH (ref 70–99)
Glucose-Capillary: 178 mg/dL — ABNORMAL HIGH (ref 70–99)
Glucose-Capillary: 166 mg/dL — ABNORMAL HIGH (ref 70–99)
Glucose-Capillary: 166 mg/dL — ABNORMAL HIGH (ref 70–99)
Glucose-Capillary: 173 mg/dL — ABNORMAL HIGH (ref 70–99)
Glucose-Capillary: 232 mg/dL — ABNORMAL HIGH (ref 70–99)

## 2024-03-26 LAB — HEMOGLOBIN A1C
Hgb A1c MFr Bld: 13.1 % — ABNORMAL HIGH (ref 4.8–5.6)
Mean Plasma Glucose: 329.27 mg/dL

## 2024-03-26 LAB — HIV ANTIBODY (ROUTINE TESTING W REFLEX): HIV Screen 4th Generation wRfx: NONREACTIVE

## 2024-03-26 LAB — APTT: aPTT: 31 s (ref 24–36)

## 2024-03-26 SURGERY — AMPUTATION, TOE
Anesthesia: Monitor Anesthesia Care | Site: Toe | Laterality: Right

## 2024-03-26 SURGERY — AMPUTATION, FOOT, TRANSMETATARSAL
Anesthesia: Choice | Site: Toe | Laterality: Left

## 2024-03-26 MED ORDER — SODIUM CHLORIDE 0.9 % IV SOLN: INTRAVENOUS | Status: DC | PRN

## 2024-03-26 MED ORDER — SODIUM CHLORIDE 0.9 % IV SOLN: INTRAVENOUS | Status: DC

## 2024-03-26 MED ORDER — MIDAZOLAM HCL 2 MG/2ML IJ SOLN
INTRAMUSCULAR | Status: DC | PRN
  Administered 2024-03-26: 2 mg via INTRAVENOUS

## 2024-03-26 MED ORDER — CHLORHEXIDINE GLUCONATE 0.12 % MT SOLN
15.0000 mL | Freq: Once | OROMUCOSAL | Status: AC
  Administered 2024-03-26: 15 mL via OROMUCOSAL
  Filled 2024-03-26: qty 15

## 2024-03-26 MED ORDER — INSULIN ASPART 100 UNIT/ML IJ SOLN
0.0000 [IU] | INTRAMUSCULAR | Status: DC | PRN
  Administered 2024-03-26: 2 [IU] via SUBCUTANEOUS

## 2024-03-26 MED ORDER — POTASSIUM CHLORIDE CRYS ER 20 MEQ PO TBCR
40.0000 meq | EXTENDED_RELEASE_TABLET | Freq: Once | ORAL | Status: AC
  Administered 2024-03-26: 40 meq via ORAL
  Filled 2024-03-26: qty 2

## 2024-03-26 MED ORDER — INSULIN NPH (HUMAN) (ISOPHANE) 100 UNIT/ML ~~LOC~~ SUSP
10.0000 [IU] | Freq: Two times a day (BID) | SUBCUTANEOUS | Status: DC
  Administered 2024-03-26 – 2024-03-28 (×4): 10 [IU] via SUBCUTANEOUS
  Filled 2024-03-26: qty 10

## 2024-03-26 MED ORDER — PHENYLEPHRINE 80 MCG/ML (10ML) SYRINGE FOR IV PUSH (FOR BLOOD PRESSURE SUPPORT)
PREFILLED_SYRINGE | INTRAVENOUS | Status: DC | PRN
  Administered 2024-03-26: 160 ug via INTRAVENOUS

## 2024-03-26 MED ORDER — ACETAMINOPHEN 325 MG PO TABS: 325.0000 mg | ORAL_TABLET | Freq: Four times a day (QID) | ORAL | Status: DC | PRN

## 2024-03-26 MED ORDER — VITAMIN C 500 MG PO TABS
1000.0000 mg | ORAL_TABLET | Freq: Every day | ORAL | Status: DC
  Administered 2024-03-26 – 2024-03-28 (×3): 1000 mg via ORAL
  Filled 2024-03-26 (×3): qty 2

## 2024-03-26 MED ORDER — 0.9 % SODIUM CHLORIDE (POUR BTL) OPTIME
TOPICAL | Status: DC | PRN
  Administered 2024-03-26: 1000 mL

## 2024-03-26 MED ORDER — ZINC SULFATE 220 (50 ZN) MG PO CAPS
220.0000 mg | ORAL_CAPSULE | Freq: Every day | ORAL | Status: DC
  Administered 2024-03-26 – 2024-03-28 (×3): 220 mg via ORAL
  Filled 2024-03-26 (×3): qty 1

## 2024-03-26 MED ORDER — ONDANSETRON HCL 4 MG/2ML IJ SOLN
INTRAMUSCULAR | Status: AC
  Filled 2024-03-26: qty 2

## 2024-03-26 MED ORDER — MORPHINE SULFATE (PF) 2 MG/ML IV SOLN: 2.0000 mg | INTRAVENOUS | Status: DC | PRN

## 2024-03-26 MED ORDER — INSULIN ASPART 100 UNIT/ML IJ SOLN
0.0000 [IU] | INTRAMUSCULAR | Status: DC
  Administered 2024-03-26: 2 [IU] via SUBCUTANEOUS
  Administered 2024-03-26: 1 [IU] via SUBCUTANEOUS
  Administered 2024-03-27: 2 [IU] via SUBCUTANEOUS
  Administered 2024-03-27 (×3): 1 [IU] via SUBCUTANEOUS
  Administered 2024-03-27: 3 [IU] via SUBCUTANEOUS
  Administered 2024-03-27: 2 [IU] via SUBCUTANEOUS
  Administered 2024-03-28: 1 [IU] via SUBCUTANEOUS
  Administered 2024-03-28: 2 [IU] via SUBCUTANEOUS
  Administered 2024-03-28: 3 [IU] via SUBCUTANEOUS
  Administered 2024-03-28: 2 [IU] via SUBCUTANEOUS
  Filled 2024-03-26: qty 1

## 2024-03-26 MED ORDER — PHENYLEPHRINE 80 MCG/ML (10ML) SYRINGE FOR IV PUSH (FOR BLOOD PRESSURE SUPPORT)
PREFILLED_SYRINGE | INTRAVENOUS | Status: AC
  Filled 2024-03-26: qty 10

## 2024-03-26 MED ORDER — LIDOCAINE HCL (PF) 1 % IJ SOLN
INTRAMUSCULAR | Status: DC | PRN
Start: 2024-03-26 — End: 2024-03-26
  Administered 2024-03-26: 20 mL

## 2024-03-26 MED ORDER — DEXTROSE IN LACTATED RINGERS 5 % IV SOLN: INTRAVENOUS | Status: DC

## 2024-03-26 MED ORDER — LIDOCAINE HCL 1 % IJ SOLN
INTRAMUSCULAR | Status: AC
  Filled 2024-03-26: qty 20

## 2024-03-26 MED ORDER — PROPOFOL 500 MG/50ML IV EMUL
INTRAVENOUS | Status: DC | PRN
  Administered 2024-03-26: 25 mg via INTRAVENOUS
  Administered 2024-03-26: 125 ug/kg/min via INTRAVENOUS
  Administered 2024-03-26: 25 mg via INTRAVENOUS

## 2024-03-26 MED ORDER — ONDANSETRON HCL 4 MG/2ML IJ SOLN
INTRAMUSCULAR | Status: DC | PRN
  Administered 2024-03-26: 4 mg via INTRAVENOUS

## 2024-03-26 MED ORDER — JUVEN PO PACK
1.0000 | PACK | Freq: Two times a day (BID) | ORAL | Status: DC
  Administered 2024-03-27 – 2024-03-28 (×4): 1 via ORAL
  Filled 2024-03-26 (×4): qty 1

## 2024-03-26 MED ORDER — VASHE WOUND IRRIGATION OPTIME
TOPICAL | Status: DC | PRN
  Administered 2024-03-26: 34 [oz_av]

## 2024-03-26 MED ORDER — HYDROMORPHONE HCL 1 MG/ML IJ SOLN
0.5000 mg | INTRAMUSCULAR | Status: DC | PRN
  Administered 2024-03-27: 1 mg via INTRAVENOUS
  Filled 2024-03-26: qty 1

## 2024-03-26 MED ORDER — ORAL CARE MOUTH RINSE: 15.0000 mL | Freq: Once | OROMUCOSAL | Status: AC

## 2024-03-26 MED ORDER — MIDAZOLAM HCL 2 MG/2ML IJ SOLN
INTRAMUSCULAR | Status: AC
  Filled 2024-03-26: qty 2

## 2024-03-26 SURGICAL SUPPLY — 30 items
BAG COUNTER SPONGE SURGICOUNT (BAG) ×1 IMPLANT
BLADE SURG 21 STRL SS (BLADE) ×1 IMPLANT
BNDG COHESIVE 4X5 TAN STRL LF (GAUZE/BANDAGES/DRESSINGS) ×1 IMPLANT
BNDG COHESIVE 6X5 TAN NS LF (GAUZE/BANDAGES/DRESSINGS) IMPLANT
BNDG GAUZE DERMACEA FLUFF 4 (GAUZE/BANDAGES/DRESSINGS) ×1 IMPLANT
CLEANSER WND VASHE 34 (WOUND CARE) IMPLANT
CLEANSER WND VASHE INSTL 34OZ (WOUND CARE) IMPLANT
COVER SURGICAL LIGHT HANDLE (MISCELLANEOUS) ×1 IMPLANT
DRAPE U-SHAPE 47X51 STRL (DRAPES) ×1 IMPLANT
DRSG ADAPTIC 3X8 NADH LF (GAUZE/BANDAGES/DRESSINGS) IMPLANT
DURAPREP 26ML APPLICATOR (WOUND CARE) ×1 IMPLANT
ELECTRODE REM PT RTRN 9FT ADLT (ELECTROSURGICAL) ×1 IMPLANT
GAUZE PAD ABD 8X10 STRL (GAUZE/BANDAGES/DRESSINGS) ×1 IMPLANT
GAUZE SPONGE 4X4 12PLY STRL (GAUZE/BANDAGES/DRESSINGS) IMPLANT
GLOVE BIOGEL PI IND STRL 9 (GLOVE) ×1 IMPLANT
GLOVE SURG ORTHO 9.0 STRL STRW (GLOVE) ×1 IMPLANT
GOWN STRL REUS W/ TWL XL LVL3 (GOWN DISPOSABLE) ×2 IMPLANT
GRAFT SKIN WND MICRO 38 (Tissue) IMPLANT
KIT BASIN OR (CUSTOM PROCEDURE TRAY) ×1 IMPLANT
KIT TURNOVER KIT B (KITS) ×1 IMPLANT
MANIFOLD NEPTUNE II (INSTRUMENTS) ×1 IMPLANT
NDL 22X1.5 STRL (OR ONLY) (MISCELLANEOUS) IMPLANT
NEEDLE 22X1.5 STRL (OR ONLY) (MISCELLANEOUS) IMPLANT
NS IRRIG 1000ML POUR BTL (IV SOLUTION) ×1 IMPLANT
PACK ORTHO EXTREMITY (CUSTOM PROCEDURE TRAY) ×1 IMPLANT
PAD ABD 8X10 STRL (GAUZE/BANDAGES/DRESSINGS) IMPLANT
PAD ARMBOARD POSITIONER FOAM (MISCELLANEOUS) ×2 IMPLANT
SUT ETHILON 2 0 PSLX (SUTURE) ×1 IMPLANT
SYR CONTROL 10ML LL (SYRINGE) IMPLANT
TOWEL GREEN STERILE (TOWEL DISPOSABLE) ×1 IMPLANT

## 2024-03-26 NOTE — Consult Note (Signed)
 WOC Nurse Consult Note: patient with longstanding wound to L plantar foot formerly followed by podiatry; patient also has R great toe wound, podiatry and orthopedics have been consulted while in ER; pending evaluation by Dr. 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  Apply Vashe moistened gauze to wound beds daily, cover with dry gauze and secure with Kerlix roll gauze.  Appy Ace bandage wrapped in same fashion as Kerlix.   This wound will require further follow-up with podiatry/ortho for ongoing management.   POC discussed with bedside nurse. WOC team will not follow. Re-consult if further needs arise.   Thank you,     Ronni Colace MSN, RN-BC, [employer] [phone]

## 2024-03-26 NOTE — Telephone Encounter (Signed)
 Pt needs a note stating he is having surgery today and will be unable to attend court tomorrow, pt will come in office to pick up letter.

## 2024-03-26 NOTE — Plan of Care (Signed)
  Problem: Education: Goal: Ability to describe self-care measures that may prevent or decrease complications (Diabetes Survival Skills Education) will improve Outcome: Progressing   Problem: Coping: Goal: Ability to adjust to condition or change in health will improve Outcome: Progressing   Problem: Nutritional: Goal: Maintenance of adequate nutrition will improve Outcome: Progressing

## 2024-03-26 NOTE — Consult Note (Signed)
 ORTHOPAEDIC CONSULTATION  REQUESTING PHYSICIAN: Samtani, Jai-Gurmukh, MD  Chief Complaint: Cellulitis and ulceration right great toe with chronic ulceration left forefoot.  HPI: [name] is a 58 y.o. male who presents with uncontrolled type 2 diabetes with cellulitis and necrotic ulcer right great toe MTP joint.  Past Medical History:  Diagnosis Date   Diabetes mellitus without complication (HCC)    Tuberculosis    tested positive,treated with medications.   Past Surgical History:  Procedure Laterality Date   FOOT SURGERY Left    I & D EXTREMITY Left 05/05/2022   Procedure: LEFT FOOT DEBRIDEMENT;  Surgeon: Timothy Ford, MD;  Location: [facility] OR;  Service: Orthopedics;  Laterality: Left;   MYRINGOTOMY WITH TUBE PLACEMENT Bilateral 07/20/2022   Procedure: MYRINGOTOMY WITH TUBE PLACEMENT;  Surgeon: Mellody Sprout, MD;  Location: [facility];  Service: ENT;  Laterality: Bilateral;  Diabetic   TONSILLECTOMY     Social History   Socioeconomic History   Marital status: Single    Spouse name: Not on file   Number of children: Not on file   Years of education: Not on file   Highest education level: Not on file  Occupational History   Not on file  Tobacco Use   Smoking status: Former    Current packs/day: 0.00    Average packs/day: 0.3 packs/day for 25.0 years (6.3 ttl pk-yrs)    Types: Cigarettes    Start date: 09/17/1997    Quit date: 09/17/2022    Years since quitting: 1.5   Smokeless tobacco: Never   Tobacco comments:    "Quit"  Vaping Use   Vaping status: Never Used  Substance and Sexual Activity   Alcohol use: No   Drug use: No   Sexual activity: Yes  Other Topics Concern   Not on file  Social History Narrative   Not on file   Social Drivers of Health   Financial Resource Strain: Patient Declined (06/28/2023)   Received from [facility] [employer] Strain (CARDIA)    Difficulty of Paying Living Expenses: Patient  declined  Food Insecurity: Patient Declined (06/28/2023)   Received from [facility] [employer] [facility] Vital Sign    Worried About Running Out of Food in the Last Year: Patient declined    Ran Out of Food in the Last Year: Patient declined  Transportation Needs: Patient Declined (06/28/2023)   Received from [facility] [employer] - Administrator, Civil Service (Medical): Patient declined    Lack of Transportation (Non-Medical): Patient declined  Physical Activity: Not on file  Stress: Not on file  Social Connections: Unknown (05/24/2022)   Received from [employer]   Social Network    Social Network: Not on file   Family History  Problem Relation Age of Onset   Colon cancer Neg Hx    Colon polyps Neg Hx    Crohn's disease Neg Hx    Esophageal cancer Neg Hx    Rectal cancer Neg Hx    Stomach cancer Neg Hx    Ulcerative colitis Neg Hx    - negative except otherwise stated in the family history section Allergies  Allergen Reactions   Metformin  And Related Diarrhea   Prior to Admission medications   Medication Sig Start Date End Date Taking? Authorizing Provider  Insulin  NPH, Human,, Isophane, (HUMULIN  N KWIKPEN) 100 UNIT/ML Kiwkpen Inject 22 Units into the skin 2 (two) times  daily. Patient taking differently: Inject 28 Units into the skin 2 (two) times daily. 12/12/22  Yes Newlin, Enobong, MD  amLODipine  (NORVASC ) 5 MG tablet Take 1 tablet (5 mg total) by mouth daily. Patient not taking: Reported on 03/25/2024 12/12/22   Newlin, Enobong, MD  amoxicillin -clavulanate (AUGMENTIN ) 875-125 MG tablet Take 1 tablet by mouth 2 (two) times daily. Patient not taking: Reported on 03/25/2024 08/20/23   [provider]  atorvastatin  (LIPITOR) 20 MG tablet Take 1 tablet (20 mg total) by mouth daily. Patient not taking: Reported on 03/25/2024 12/12/22   Newlin, Enobong, MD  Continuous Blood Gluc Transmit (DEXCOM G6 TRANSMITTER) MISC Use to  check blood sugar three times daily. Change transmitter once every 909 days. E11.69 06/05/22   Newlin, Enobong, MD  gabapentin  (NEURONTIN ) 300 MG capsule Take 1 capsule (300 mg total) by mouth at bedtime. Patient not taking: Reported on 03/25/2024 09/11/22   Newlin, Enobong, MD  Insulin  Pen Needle 32G X 4 MM MISC Use as directed to inject insulin  up to 4 times daily. 11/21/22   Newlin, Enobong, MD  metFORMIN  (GLUCOPHAGE -XR) 500 MG 24 hr tablet Take 2 tablets (1,000 mg total) by mouth 2 (two) times daily with a meal. Patient not taking: Reported on 03/25/2024 12/12/22   Newlin, Enobong, MD  methocarbamol  (ROBAXIN ) 500 MG tablet Take 1 tablet (500 mg total) by mouth every 6 (six) hours as needed for muscle spasms. Patient not taking: Reported on 03/25/2024 07/10/22   Timothy Ford, MD   MR FOOT RIGHT WO CONTRAST Result Date: 03/25/2024 CLINICAL DATA:  Foot pain and swelling.  Diabetic foot ulcers. EXAM: MRI OF THE RIGHT FOREFOOT WITHOUT CONTRAST TECHNIQUE: Multiplanar, multisequence MR imaging of the right foot was performed. No intravenous contrast was administered. COMPARISON:  Radiographs 03/26/2019 FINDINGS: Open wounds noted in along the medial and dorsal aspect of the great toe with significant underlying cellulitis. No discrete fluid collection to suggest a drainable abscess. Gas is noted throughout the soft tissues as seen on the plain films. No findings suspicious for septic arthritis. There is abnormal STIR signal intensity in the proximal phalanx of the great toe which is suspicious for early osteomyelitis. No T1 signal abnormality. The other bony structures are intact. Severe diffuse cellulitis and myofasciitis without findings suspicious for pyomyositis. IMPRESSION: 1. Open wounds along the medial and dorsal aspect of the great toe with significant underlying cellulitis. No discrete fluid collection to suggest a drainable abscess. 2. Gas noted in the subcutaneous tissues of the great toe as seen on the  plain films. 3. Abnormal STIR signal intensity in the proximal phalanx of the great toe is suspicious for early osteomyelitis. 4. Severe diffuse cellulitis and myofasciitis without findings suspicious for pyomyositis. Electronically Signed   By: Marrian Siva M.D.   On: 03/25/2024 23:11   CT FOOT LEFT WO CONTRAST Result Date: 03/25/2024 CLINICAL DATA:  Evaluate for foreign body. EXAM: CT OF THE LEFT FOOT WITHOUT CONTRAST TECHNIQUE: Multidetector CT imaging of the left foot was performed according to the standard protocol. Multiplanar CT image reconstructions were also generated. RADIATION DOSE REDUCTION: This exam was performed according to the departmental dose-optimization program which includes automated exposure control, adjustment of the mA and/or kV according to patient size and/or use of iterative reconstruction technique. COMPARISON:  Left foot x-ray 03/25/2024 FINDINGS: Bones/Joint/Cartilage There is no acute fracture or dislocation identified. There is increased sclerosis with cortical thickening involving the distal second and third metatarsals, second proximal phalanx, third proximal phalanx, and portions  of the fourth and fifth mid metatarsals. There is also cortical thickening and sclerosis of the sesamoids at the level of the first metatarsal head. There is ankylosis across the second metatarsophalangeal joint. Mild degenerative changes affect the ankle and midfoot. Plantar and posterior calcaneal spurs are present. Ligaments Suboptimally assessed by CT. Muscles and Tendons The plantar intramuscular and fascial edema present. Soft tissues There is soft tissue ulceration overlying the plantar aspect of the first, second and third metatarsophalangeal joints with associated soft tissue swelling and infiltration. There is no soft tissue gas collection. There is suggestion of joint effusion at the first metatarsophalangeal joint. No radiopaque foreign body identified. There is also subcutaneous edema  over the dorsum of the foot, involving the first toe, and surrounding the ankle. IMPRESSION: 1. Soft tissue ulceration overlying the plantar aspect of the first, second and third metatarsophalangeal joints with associated soft tissue swelling and infiltration. No soft tissue gas collection. 2. Suggestion of joint effusion at the first metatarsophalangeal joint. Correlate clinically for septic arthritis. 3. No radiopaque foreign body identified. 4. Increased sclerosis with cortical thickening involving the distal second and third metatarsals, second proximal phalanx, third proximal phalanx, and portions of the fourth and fifth mid metatarsals. Findings are nonspecific and may be related to chronic osteomyelitis. This can be better evaluated with MRI. 5. Subcutaneous edema over the dorsum of the foot, involving the first toe, and surrounding the ankle. Electronically Signed   By: Tyron Gallon M.D.   On: 03/25/2024 22:35   DG Foot Complete Right Result Date: 03/25/2024 CLINICAL DATA:  Wound EXAM: RIGHT FOOT COMPLETE - 3+ VIEW COMPARISON:  None Available. FINDINGS: No fracture or malalignment. Moderate plantar calcaneal spur and posterior enthesophyte. Vascular calcifications. No fracture or malalignment. No osseous destructive change. Findings suspicious for small volume soft tissue gas at the level of the first proximal phalanx and MTP joint. IMPRESSION: Findings suspicious for small volume soft tissue gas at the level of the first proximal phalanx and MTP joint, raising concern for necrotic infection. No osseous destructive change. Electronically Signed   By: Esmeralda Hedge M.D.   On: 03/25/2024 17:00   DG Foot Complete Left Result Date: 03/25/2024 CLINICAL DATA:  Wound multiple EXAM: LEFT FOOT - COMPLETE 3+ VIEW COMPARISON:  05/04/2022, MRI 06/29/2022 FINDINGS: No acute fracture is seen. Large wound or ulcer plantar aspect of foot at the level of the MTP joints. Cortical erosive change involving sesamoid bones  at the head of first metatarsal suspicious for osteomyelitis. There is probable wound or ulceration at the level of the distal fifth metatarsal laterally. Interval thick periosteal reaction at the fifth metatarsal. Small lucent defect at the neck of the fifth metatarsal. Deformity at the second third and fourth MTP joints, probably due to sequela of previously noted septic arthritis. Linear wire like density on the frontal views at the level of third and fourth MTP joints is indeterminate for soft tissue foreign body. IMPRESSION: 1. Large wound or ulcer plantar aspect of foot at the level of the MTP joints. Cortical erosive change involving sesamoid bones at the head of first metatarsal suspicious for osteomyelitis. 2. Probable wound or ulceration at the level of the distal fifth metatarsal laterally. Interval thick periosteal reaction at the fifth metatarsal with small lucent defect at the neck of the fifth metatarsal possible chronic osteomyelitis. This could be further assessed with MRI 3. Deformity at the second third and fourth MTP joints, probably due to sequela of previously noted septic arthritis. 4. Linear  wire like density on the frontal views at the level of third and fourth MTP joints is indeterminate for soft tissue foreign body. Electronically Signed   By: Esmeralda Hedge M.D.   On: 03/25/2024 16:58   - pertinent xrays, CT, MRI studies were reviewed and independently interpreted  Positive ROS: All other systems have been reviewed and were otherwise negative with the exception of those mentioned in the HPI and as above.  Physical Exam: General: Alert, no acute distress Psychiatric: Patient is competent for consent with normal mood and affect Lymphatic: No axillary or cervical lymphadenopathy Cardiovascular: No pedal edema Respiratory: No cyanosis, no use of accessory musculature GI: No organomegaly, abdomen is soft and non-tender    Images:  @ENCIMAGES @  Labs:  Lab Results   Component Value Date   HGBA1C 13.1 (H) 03/26/2024   HGBA1C 10.3 (A) 12/12/2022   HGBA1C 9.6 (A) 09/11/2022   ESRSEDRATE 32 (H) 08/28/2023   ESRSEDRATE 34 (H) 10/31/2022   ESRSEDRATE 41 (H) 08/01/2022   CRP 23.8 (H) 03/25/2024   CRP 1.0 (H) 08/28/2023   CRP 0.9 10/31/2022   REPTSTATUS 05/10/2022 FINAL 05/05/2022   GRAMSTAIN  05/05/2022    RARE WBC PRESENT,BOTH PMN AND MONONUCLEAR ABUNDANT GRAM POSITIVE COCCI ABUNDANT GRAM NEGATIVE RODS    CULT  05/05/2022    FEW STREPTOCOCCUS GALLOLYTICUS FEW STAPHYLOCOCCUS AUREUS FEW STREPTOCOCCUS GORDONII FEW PREVOTELLA SPECIES BETA LACTAMASE POSITIVE Performed at [facility], [address], [place], [place] [address]    [facility] STAPHYLOCOCCUS AUREUS 05/05/2022   LABORGA STREPTOCOCCUS GORDONII 05/05/2022   LABORGA STREPTOCOCCUS GALLOLYTICUS 05/05/2022    Lab Results  Component Value Date   ALBUMIN 3.9 08/28/2023   ALBUMIN 3.4 (L) 08/11/2023   ALBUMIN 3.5 10/31/2022        Latest Ref Rng & Units 03/26/2024    5:00 AM 03/25/2024   10:39 AM 03/25/2024   10:36 AM  CBC EXTENDED  WBC 4.0 - 10.5 K/uL 15.6     RBC 4.22 - 5.81 MIL/uL 3.48     Hemoglobin 13.0 - 17.0 g/dL 9.2  60.4  54.0   HCT 98.1 - 52.0 % 27.9  35.0  36.0   Platelets 150 - 400 K/uL 285       Neurologic: Patient does not have protective sensation bilateral lower extremities.   MUSCULOSKELETAL:   Skin: Examination patient has large Wagner grade 1 ulcers beneath the 1st and 5th metatarsal head of the left foot.  This does not probe to bone there is no cellulitis.  Patient has a palpable dorsalis pedis pulse.  On the right foot patient has cellulitis that extends proximal to the ankle involving the right foot.  There is necrotic ulceration around the great toe MTP joint.  Review of the MRI scan shows air in the soft tissue with a large area of cellulitis essentially circumferentially around the first metatarsal head.  White cell count 15.6 with a hemoglobin of  9.2.  Hemoglobin A1c 13.1.  Calf is soft nontender no ascending cellulitis.  Assessment: Assessment: Abscess ulceration right great toe with cellulitis proximal to the ankle  Plan: Plan: Recommend proceeding with a first ray amputation right foot.  Discussed that with the air in the soft tissue patient has a high risk of developing necrotizing fasciitis and the infection extending up the leg.  Patient states he understands states that he cannot proceed with surgery today due to his delay in eating yesterday.  I will be out of town Friday would be able  to proceed with surgery next week.  Discussed with patient he may need to undergo urgent surgery with a another physician.  Patient states he understands and wishes to wait on surgery at this time.  Thank you for the consult and the opportunity to see Mr. Matthew Pridgen, MD [facility] [phone] 8:16 AM

## 2024-03-26 NOTE — Op Note (Signed)
 03/26/2024  4:16 PM  PATIENT:  [name]    PRE-OPERATIVE DIAGNOSIS:  Osteomyelitis right foot  POST-OPERATIVE DIAGNOSIS:  Same  PROCEDURE: Right foot first ray amputation. Local tissue rearrangement for wound closure 4 x 10 cm. Application Kerecis micro graft 38 cm to cover wound surface area 40 cm.  SURGEON:  Timothy Ford, MD  PHYSICIAN ASSISTANT:None ANESTHESIA:   General  PREOPERATIVE INDICATIONS:  [name] is a  58 y.o. male with a diagnosis of Osteomyelitis right foot who failed conservative measures and elected for surgical management.    The risks benefits and alternatives were discussed with the patient preoperatively including but not limited to the risks of infection, bleeding, nerve injury, cardiopulmonary complications, the need for revision surgery, among others, and the patient was willing to proceed.  OPERATIVE IMPLANTS:   Implant Name Type Inv. Item Serial No. Manufacturer Lot No. LRB No. Used Action  GRAFT SKIN WND MICRO 38 - [id number] Tissue GRAFT SKIN WND MICRO 38  [employer] [id number] Right 1 Implanted    @ENCIMAGES @  OPERATIVE FINDINGS: Tissue margins were grossly clear however there was minimal petechial bleeding.  Would continue the antibiotics for 3 days postoperatively.  OPERATIVE PROCEDURE: Patient was brought to the operating room underwent a MAC anesthetic.  After adequate levels anesthesia were obtained patient's right lower extremity was prepped using DuraPrep draped into a sterile field a timeout was called.  Patient underwent local infiltration with 20 cc of 1% lidocaine  plain.  A racquet incision was made around the ulcerative tissue in the first ray and the first ray and great toe were resected as 1 block of tissue through the base of the first metatarsal.  Electrocautery was used hemostasis.  The tissue margins were clear however there is minimal petechial bleeding more ischemic type changes.  After hemostasis was obtained the wound was  irrigated with Vashe.  The tissue margins were undermined to allow for local tissue transfer.  The wound was filled with 38 cm of Kerecis micro graft to cover a wound surface area of 40 cm.  Local tissue transfer was used to close the wound 4 x 10 cm with 2-0 nylon.  A sterile dressing was applied patient was taken the PACU in stable condition.   DISCHARGE PLANNING:  Antibiotic duration: Continue antibiotics for 72 hours  Weightbearing: Touchdown weightbearing on the right  Pain medication: Opioid pathway  Dressing care/ Wound VAC: Dry dressing reinforce as needed  Ambulatory devices: Walker or crutches  Discharge to: Anticipate discharge to home.  Follow-up: In the office 1 week post operative.

## 2024-03-26 NOTE — Progress Notes (Signed)
 [facility] ROUNDING NOTE [name] UUV:[id number]  DOB: 08-Nov-1966  DOA: 03/25/2024  PCP: Joaquin Mulberry, MD  03/26/2024,8:34 AM  LOS: 1 day    Code Status: Full code   from: Home current Dispo: Unclear   58 year old black male known smoker Diabetes mellitus with previous left foot diabetic ulcer previously treated 12/2021 as well as admission 05/04/2022 with debridement by Dr. Julio Ohm at the time and refused amputation at that time after I&D-was supposed to ideally nonweightbearing patient was placed on IV antibiotics for 6 weeks and was left with a PICC line  03/2021 he presented right foot pain hyperglycemia out of insulin  X1 month-also with significant bruising right flank without fever doing dressing changes at home  sodium 128 potassium 4.0 BUN/creatinine 14/1.8 WBC 18 hemoglobin 10.8 platelet 340 Left foot x-ray showed large MTP joints cortical erosive changes involving sesamoid at head of first metatarsal suspicious for osteo probable wound ulceration at level of distal fifth metatarsal interval thick periosteal reaction suggestive chronic osteomyelitis deformity second third fourth MTP joints linear wired I can see up in the frontal views suspicious for soft tissue foreign body Right foot x-ray showed findings suspicious small volume soft tissue gas at level of first MTP proximal phalanx Blood cultures performed pending UA negative nitrites leukocytes >500 glucose MRI foot open wounds along the medial dorsal aspect of great toe with significant underlying cellulitis no discrete fluid collection gas noted in subcutaneous tissues great toe on plain films abnormal STIR signal intensity proximal phalanx of great toe suspicion for early osteo-significant severe diffuse cellulitis mild fasciitis without pyomyositis CT left foot soft tissue ulceration overlying plantar aspect first second third MTP joints no soft tissue gas suggestion joint effusion first MTP joint   Plan  Diabetic foot wound with gas gangrene  potentially right side left great toe Willing to perform surgery-n.p.o.-vancomycin  and Zosyn  source control until surgery-placed on D5 LR 75 cc/8 Pain control Oxy IR 5 every 4 as needed moderate pain-IV morphine  2-4 mg every 2 as needed severe pain  Poorly controlled diabetes mellitus A1c 13--unclear compliance to  regimen Home meds supposed to be 22 units twice daily NPH, metformin  1 g twice daily--not good metformin  candidate Switch to NPH 10 twice daily with every 4 coverage until surgery Resume gabapentin  300 at bedtime for neuropathy closer to discharge  -Treatment secondary to hypoglycemia and osmotic diuresis Resolving  Mild hypokalemia Replace orally with K-Dur 40  AKI superimposed on CKD 2 Placed on fluids as above  Long discussion with patient at bedside willing for surgery discussed with Dr. Julio Ohm phone who is willing to place him back on schedule as patient had declined earlier today  DVT prophylaxis: SCD for now  Status is: Inpatient Remains inpatient appropriate because:   Requires surgery   Subjective: Reluctant for surgery-cites a prior example of his left foot healing up-understands that right foot does need a procedure done We had a good discussion and Dr. Julio Ohm is aware of change in events and is willing to chat with him again and go over things  Objective + exam Vitals:   03/25/24 2000 03/25/24 2042 03/26/24 0520 03/26/24 0725  BP:  (!) 135/98 109/66 116/72  Pulse:  80 88 85  Resp:  (!) 22 16 17   Temp: 98.5 F (36.9 C) 97.8 F (36.6 C) 98.5 F (36.9 C) 98.8 F (37.1 C)  TempSrc: Oral Oral    SpO2:  97% 97% 98%  Weight:      Height:  Filed Weights   03/25/24 1008  Weight: 99.8 kg    Examination: EOMI NCAT no focal deficit no icterus no pallor no wheeze no rales or rhonchi Neck soft supple S1-S2 no murmur no ROM intact Right foot wound  Data Reviewed: reviewed   CBC    Component Value Date/Time   WBC 15.6 (H) 03/26/2024 0500   RBC  3.48 (L) 03/26/2024 0500   HGB 9.2 (L) 03/26/2024 0500   HCT 27.9 (L) 03/26/2024 0500   PLT 285 03/26/2024 0500   MCV 80.2 03/26/2024 0500   MCH 26.4 03/26/2024 0500   MCHC 33.0 03/26/2024 0500   RDW 13.5 03/26/2024 0500   LYMPHSABS 2.4 08/28/2023 1305   MONOABS 0.6 08/28/2023 1305   EOSABS 0.2 08/28/2023 1305   BASOSABS 0.0 08/28/2023 1305      Latest Ref Rng & Units 03/26/2024    5:00 AM 03/25/2024   10:39 AM 03/25/2024   10:36 AM  CMP  Glucose 70 - 99 mg/dL 562   130   BUN 6 - 20 mg/dL 23   16   Creatinine 8.65 - 1.24 mg/dL 7.84   6.96   Sodium 295 - 145 mmol/L 131  129  128   Potassium 3.5 - 5.1 mmol/L 3.3  3.9  3.9   Chloride 98 - 111 mmol/L 97   94   CO2 22 - 32 mmol/L 23     Calcium  8.9 - 10.3 mg/dL 7.8       Scheduled Meds:  insulin  aspart  0-6 Units Subcutaneous Q4H   insulin  NPH Human  10 Units Subcutaneous BID AC & HS   senna-docusate  2 tablet Oral BID   sodium chloride  flush  3 mL Intravenous Q12H   Continuous Infusions:  piperacillin -tazobactam (ZOSYN )  IV 3.375 g (03/26/24 0541)   vancomycin       Time 60  Verlie Glisson, MD  [facility]

## 2024-03-26 NOTE — Transfer of Care (Signed)
 Immediate Anesthesia Transfer of Care Note  Patient: [name]  Procedure(s) Performed: AMPUTATION, TOE (Right: Toe)  Patient Location: PACU  Anesthesia Type:MAC  Level of Consciousness: drowsy and patient cooperative  Airway & Oxygen  Therapy: Patient Spontanous Breathing  Post-op Assessment: Report given to RN and Post -op Vital signs reviewed and stable  Post vital signs: Reviewed and stable  Last Vitals:  Vitals Value Taken Time  BP 119/70 03/26/24 1630  Temp 36.6 C 03/26/24 1617  Pulse 78 03/26/24 1634  Resp 17 03/26/24 1634  SpO2 95 % 03/26/24 1634  Vitals shown include unfiled device data.  Last Pain:  Vitals:   03/26/24 1617  TempSrc:   PainSc: 0-No pain         Complications: No notable events documented.

## 2024-03-26 NOTE — Anesthesia Preprocedure Evaluation (Addendum)
 Anesthesia Evaluation  Patient identified by MRN, date of birth, ID band Patient awake    Reviewed: Allergy & Precautions, NPO status , Patient's Chart, lab work & pertinent test results  Airway Mallampati: II  TM Distance: >3 FB Neck ROM: Limited    Dental  (+) Poor Dentition, Missing, Dental Advisory Given, Chipped   Pulmonary Patient abstained from smoking., former smoker   breath sounds clear to auscultation       Cardiovascular hypertension, Pt. on medications  Rhythm:Regular Rate:Normal     Neuro/Psych negative neurological ROS  negative psych ROS   GI/Hepatic negative GI ROS, Neg liver ROS,,,(+)     substance abuse  cocaine use  Endo/Other  diabetes, Poorly Controlled, Type 2, Insulin  Dependent    Renal/GU Renal InsufficiencyRenal disease     Musculoskeletal Left foot abscess   Abdominal  (+) + obese  Peds  Hematology  (+) Blood dyscrasia, anemia   Anesthesia Other Findings   Reproductive/Obstetrics                             Anesthesia Physical Anesthesia Plan  ASA: 3  Anesthesia Plan: General   Post-op Pain Management: Ofirmev  IV (intra-op)*   Induction:   PONV Risk Score and Plan: 2 and Treatment may vary due to age or medical condition, Ondansetron  and Dexamethasone   Airway Management Planned: LMA  Additional Equipment: None  Intra-op Plan:   Post-operative Plan: Extubation in OR  Informed Consent: I have reviewed the patients History and Physical, chart, labs and discussed the procedure including the risks, benefits and alternatives for the proposed anesthesia with the patient or authorized representative who has indicated his/her understanding and acceptance.     Dental advisory given  Plan Discussed with: CRNA  Anesthesia Plan Comments:         Anesthesia Quick Evaluation

## 2024-03-26 NOTE — Anesthesia Postprocedure Evaluation (Signed)
 Anesthesia Post Note  Patient: [name]  Procedure(s) Performed: AMPUTATION, TOE (Right: Toe)     Patient location during evaluation: PACU Anesthesia Type: MAC Level of consciousness: awake and alert Pain management: pain level controlled Vital Signs Assessment: post-procedure vital signs reviewed and stable Respiratory status: spontaneous breathing, nonlabored ventilation, respiratory function stable and patient connected to nasal cannula oxygen  Cardiovascular status: stable and blood pressure returned to baseline Postop Assessment: no apparent nausea or vomiting Anesthetic complications: no   No notable events documented.  Last Vitals:  Vitals:   03/26/24 1630 03/26/24 1645  BP: 119/70 127/84  Pulse: 78 76  Resp: 14 14  Temp:    SpO2: 93% 95%    Last Pain:  Vitals:   03/26/24 1645  TempSrc:   PainSc: 0-No pain                 Lethaniel Rave

## 2024-03-26 NOTE — Progress Notes (Signed)
 Patient ID: [name], male   DOB: February 24, 1966, 58 y.o.   MRN: [id number] I spoke with patient and he agrees to proceed with a right foot first ray amputation.  Will plan for surgery this afternoon.  Please keep him n.p.o.

## 2024-03-26 NOTE — Progress Notes (Signed)
 Patient asked if Dr. Julio Ohm looked at wound on left foot. I didn't see any noted it. I took of sock of left foot and sock string was partially stuck on foot. Patient stated he does dressing changes and leaves it to air dry at home. I put a wet guaze and kerlex on wound and reported this to Brian Campanile to get a wound consult to educate and do treatments while in hospital.

## 2024-03-26 NOTE — Telephone Encounter (Signed)
Letter written and at front desk ready for pickup

## 2024-03-26 NOTE — Progress Notes (Signed)
 Orthopedic Tech Progress Note Patient Details:  [name] December 24, 1965 [id number]  Ortho Devices Type of Ortho Device: Postop shoe/boot Ortho Device/Splint Location: RUE Ortho Device/Splint Interventions: Ordered, Application, Removal   Post Interventions Patient Tolerated: Well Instructions Provided: Care of device  Originally bought a size M post op shoe that fit accordingly and explained to the patient and the family the reason why medium would be a good option, however the family requested an L post op shoe. Went back upstairs to grab another shoe.  Udell Gamble 03/26/2024, 6:13 PM

## 2024-03-27 ENCOUNTER — Encounter (HOSPITAL_COMMUNITY): Payer: Self-pay | Admitting: Orthopedic Surgery

## 2024-03-27 ENCOUNTER — Other Ambulatory Visit: Payer: Self-pay

## 2024-03-27 DIAGNOSIS — L089 Local infection of the skin and subcutaneous tissue, unspecified: Secondary | ICD-10-CM | POA: Diagnosis not present

## 2024-03-27 DIAGNOSIS — E11628 Type 2 diabetes mellitus with other skin complications: Secondary | ICD-10-CM | POA: Diagnosis not present

## 2024-03-27 LAB — BASIC METABOLIC PANEL WITH GFR
Anion gap: 11 (ref 5–15)
BUN: 22 mg/dL — ABNORMAL HIGH (ref 6–20)
CO2: 22 mmol/L (ref 22–32)
Calcium: 7.8 mg/dL — ABNORMAL LOW (ref 8.9–10.3)
Chloride: 97 mmol/L — ABNORMAL LOW (ref 98–111)
Creatinine, Ser: 2.41 mg/dL — ABNORMAL HIGH (ref 0.61–1.24)
GFR, Estimated: 31 mL/min — ABNORMAL LOW (ref >60)
Glucose, Bld: 186 mg/dL — ABNORMAL HIGH (ref 70–99)
Potassium: 3.7 mmol/L (ref 3.5–5.1)
Sodium: 130 mmol/L — ABNORMAL LOW (ref 135–145)

## 2024-03-27 LAB — GLUCOSE, CAPILLARY
Glucose-Capillary: 165 mg/dL — ABNORMAL HIGH (ref 70–99)
Glucose-Capillary: 174 mg/dL — ABNORMAL HIGH (ref 70–99)
Glucose-Capillary: 181 mg/dL — ABNORMAL HIGH (ref 70–99)
Glucose-Capillary: 232 mg/dL — ABNORMAL HIGH (ref 70–99)
Glucose-Capillary: 232 mg/dL — ABNORMAL HIGH (ref 70–99)
Glucose-Capillary: 242 mg/dL — ABNORMAL HIGH (ref 70–99)
Glucose-Capillary: 289 mg/dL — ABNORMAL HIGH (ref 70–99)

## 2024-03-27 LAB — CBC WITH DIFFERENTIAL/PLATELET
Abs Immature Granulocytes: 0.13 10*3/uL — ABNORMAL HIGH (ref 0.00–0.07)
Basophils Absolute: 0 10*3/uL (ref 0.0–0.1)
Basophils Relative: 0 %
Eosinophils Absolute: 0 10*3/uL (ref 0.0–0.5)
Eosinophils Relative: 0 %
HCT: 27.6 % — ABNORMAL LOW (ref 39.0–52.0)
Hemoglobin: 8.9 g/dL — ABNORMAL LOW (ref 13.0–17.0)
Immature Granulocytes: 1 %
Lymphocytes Relative: 11 %
Lymphs Abs: 1.4 10*3/uL (ref 0.7–4.0)
MCH: 25.9 pg — ABNORMAL LOW (ref 26.0–34.0)
MCHC: 32.2 g/dL (ref 30.0–36.0)
MCV: 80.2 fL (ref 80.0–100.0)
Monocytes Absolute: 1.2 10*3/uL — ABNORMAL HIGH (ref 0.1–1.0)
Monocytes Relative: 10 %
Neutro Abs: 9.5 10*3/uL — ABNORMAL HIGH (ref 1.7–7.7)
Neutrophils Relative %: 78 %
Platelets: 334 10*3/uL (ref 150–400)
RBC: 3.44 MIL/uL — ABNORMAL LOW (ref 4.22–5.81)
RDW: 13.9 % (ref 11.5–15.5)
WBC: 12.3 10*3/uL — ABNORMAL HIGH (ref 4.0–10.5)
nRBC: 0 % (ref 0.0–0.2)

## 2024-03-27 LAB — MAGNESIUM: Magnesium: 2.1 mg/dL (ref 1.7–2.4)

## 2024-03-27 MED ORDER — SODIUM CHLORIDE 0.9 % IV SOLN: INTRAVENOUS | Status: AC

## 2024-03-27 MED ORDER — METRONIDAZOLE 500 MG/100ML IV SOLN: 500.0000 mg | Freq: Two times a day (BID) | INTRAVENOUS | Status: DC

## 2024-03-27 MED ORDER — OXYCODONE-ACETAMINOPHEN 5-325 MG PO TABS
1.0000 | ORAL_TABLET | ORAL | Status: DC | PRN
  Administered 2024-03-27: 2 via ORAL
  Administered 2024-03-28: 1 via ORAL
  Filled 2024-03-27: qty 2
  Filled 2024-03-27: qty 1

## 2024-03-27 MED ORDER — POTASSIUM CHLORIDE CRYS ER 20 MEQ PO TBCR
40.0000 meq | EXTENDED_RELEASE_TABLET | Freq: Every day | ORAL | Status: DC
  Administered 2024-03-27 – 2024-03-28 (×2): 40 meq via ORAL
  Filled 2024-03-27 (×2): qty 2

## 2024-03-27 MED ORDER — METRONIDAZOLE 500 MG PO TABS
500.0000 mg | ORAL_TABLET | Freq: Two times a day (BID) | ORAL | Status: DC
  Administered 2024-03-27 – 2024-03-28 (×3): 500 mg via ORAL
  Filled 2024-03-27 (×3): qty 1

## 2024-03-27 MED ORDER — OXYCODONE HCL 5 MG PO TABS
5.0000 mg | ORAL_TABLET | ORAL | Status: DC | PRN
  Administered 2024-03-27: 5 mg via ORAL
  Filled 2024-03-27: qty 1

## 2024-03-27 MED ORDER — METHOCARBAMOL 750 MG PO TABS
750.0000 mg | ORAL_TABLET | Freq: Three times a day (TID) | ORAL | Status: DC
  Administered 2024-03-27 – 2024-03-28 (×5): 750 mg via ORAL
  Filled 2024-03-27 (×5): qty 1

## 2024-03-27 MED ORDER — SODIUM CHLORIDE 0.9 % IV SOLN
2.0000 g | INTRAVENOUS | Status: AC
  Administered 2024-03-27 – 2024-03-28 (×2): 2 g via INTRAVENOUS
  Filled 2024-03-27 (×2): qty 20

## 2024-03-27 MED ORDER — HYDROMORPHONE HCL 1 MG/ML IJ SOLN
0.5000 mg | INTRAMUSCULAR | Status: DC | PRN
Start: 2024-03-27 — End: 2024-03-28
  Administered 2024-03-28: 1 mg via INTRAVENOUS
  Administered 2024-03-28: 0.5 mg via INTRAVENOUS
  Filled 2024-03-27 (×2): qty 1

## 2024-03-27 MED ORDER — LINEZOLID 600 MG PO TABS
600.0000 mg | ORAL_TABLET | Freq: Two times a day (BID) | ORAL | Status: DC
  Administered 2024-03-27 – 2024-03-28 (×2): 600 mg via ORAL
  Filled 2024-03-27 (×3): qty 1

## 2024-03-27 MED ORDER — GABAPENTIN 300 MG PO CAPS
300.0000 mg | ORAL_CAPSULE | Freq: Every day | ORAL | Status: DC
  Administered 2024-03-27: 300 mg via ORAL
  Filled 2024-03-27: qty 1

## 2024-03-27 MED ORDER — DOXYCYCLINE HYCLATE 100 MG PO TABS
100.0000 mg | ORAL_TABLET | Freq: Two times a day (BID) | ORAL | 0 refills | Status: DC
  Filled 2024-03-27: qty 20, 10d supply, fill #0

## 2024-03-27 MED ORDER — OXYCODONE-ACETAMINOPHEN 5-325 MG PO TABS
1.0000 | ORAL_TABLET | ORAL | 0 refills | Status: DC | PRN
  Filled 2024-03-27: qty 30, 5d supply, fill #0

## 2024-03-27 MED ORDER — SODIUM CHLORIDE 0.9 % IV SOLN: 2.0000 g | Freq: Two times a day (BID) | INTRAVENOUS | Status: DC

## 2024-03-27 NOTE — Inpatient Diabetes Management (Signed)
 Inpatient Diabetes Program Recommendations  AACE/ADA: New Consensus Statement on Inpatient Glycemic Control (2015)  Target Ranges:  Prepandial:   less than 140 mg/dL      Peak postprandial:   less than 180 mg/dL (1-2 hours)      Critically ill patients:  140 - 180 mg/dL   Lab Results  Component Value Date   GLUCAP 289 (H) 03/27/2024   HGBA1C 13.1 (H) 03/26/2024    Review of Glycemic Control  Diabetes history: DM 2 Outpatient Diabetes medications: NPH 28 units bid Current orders for Inpatient glycemic control:  NPH 10 units bid Novolog  0-6 units Q4 hours  A1c 13.1%  Spoke with pt at bedside. Pt reports not having insulin  for approx 1 month due to confusion of insulin  at the [facility] and his PCP. Pt reports also states he has relocated recently and does not know where his testing supplies are. Discussed A1c level. Informed pt of backup insulin  at [facility] over the counter for $25/vial or $43 for a box of 5 insulin  pens even though he will pay more than his insurance copay $4. Stressed importance of glucose control. Pt had some questions and concerns about constipation and what to do for it.   Discharge Recommendations: Intermediate acting recommendations:  Humulin  N (NPH) dose TBD at time of discharge  Supply/Referral recommendations: Glucometer Test strips Lancet device Lancets Pen needles - standard   Use Adult Diabetes Insulin  Treatment Post Discharge order set.  Thanks,  Eloise Hake RN, MSN, BC-ADM Inpatient Diabetes Coordinator Team Pager [phone] (8a-5p)

## 2024-03-27 NOTE — Progress Notes (Signed)
 Post op boot for BLE foot ordered, RN educated patient about wearing post op boot while mobilizing/ ambulating.

## 2024-03-27 NOTE — Progress Notes (Signed)
 Orthopedic Tech Progress Note Patient Details:  [name] 11/11/1966 [id number]  PO shoe is at bedside for the LLE.  Ortho Devices Type of Ortho Device: Postop shoe/boot Ortho Device/Splint Location: for LLE Ortho Device/Splint Interventions: Ordered   Post Interventions Patient Tolerated: Well Instructions Provided: Care of device, Adjustment of device  [name] [name] 03/27/2024, 12:55 PM

## 2024-03-27 NOTE — Progress Notes (Signed)
 Patient ID: [name], male   DOB: 1966-09-07, 58 y.o.   MRN: [id number] Patient is postoperative day 1 right foot first ray amputation.  There is some bleeding on the dressing.  Patient is to maintain minimized weightbearing for the right lower extremity.  Changed to a new dry dressing at time of discharge.  Continue IV antibiotics during hospitalization and I will place an order for doxycycline  and Percocet for discharge.

## 2024-03-27 NOTE — Evaluation (Signed)
 Physical Therapy Evaluation Patient Details Name: [name] MRN: [id number] DOB: 12-Aug-1966 Today's Date: 03/27/2024  History of Present Illness  Patient is a 58 y/o male admitted 03/25/24 due to R great toe pain and swelling.  Found to have cellulitis and osteomyelitis; underwent R first ray amputation 03/26/24.  PMH positive for DM, L foot surgery with chronic wound, HTN.  Clinical Impression  Patient presents with decreased mobility post R first ray amputation now TDWB with post-op shoe.  Has longstanding wound on L foot as well.  He was able to mobilize with RW in the room though not exactly touch down despite frequent cues and demonstration.  Discussed limiting ambulation as much as possible and using the walker.  He sill benefit from skilled PT in the acute setting and from follow up outpatient PT at d/c.      If plan is discharge home, recommend the following: A little help with walking and/or transfers;A little help with bathing/dressing/bathroom;Assistance with cooking/housework;Assist for transportation;Direct supervision/assist for medications management;Help with stairs or ramp for entrance   Can travel by private vehicle        Equipment Recommendations Rolling walker (2 wheels)  Recommendations for Other Services       Functional Status Assessment Patient has had a recent decline in their functional status and demonstrates the ability to make significant improvements in function in a reasonable and predictable amount of time.     Precautions / Restrictions Precautions Precautions: Fall Required Braces or Orthoses: Other Brace Other Brace: post-op shoe both feet Restrictions Weight Bearing Restrictions Per Provider Order: Yes RLE Weight Bearing Per Provider Order: Touchdown weight bearing      Mobility  Bed Mobility Overal bed mobility: Modified Independent                  Transfers Overall transfer level: Needs assistance Equipment used: Rolling walker  (2 wheels) Transfers: Sit to/from Stand Sit to Stand: Contact guard assist           General transfer comment: cues; demo for weight bearing precautions throughout    Ambulation/Gait Ambulation/Gait assistance: Contact guard assist Gait Distance (Feet): 25 Feet Assistive device: Rolling walker (2 wheels) Gait Pattern/deviations: Step-to pattern, Decreased stride length, Trunk flexed       General Gait Details: cues throughout for limiting weight bearing on feet  Stairs            Wheelchair Mobility     Tilt Bed    Modified Rankin (Stroke Patients Only)       Balance Overall balance assessment: Needs assistance Sitting-balance support: Feet supported Sitting balance-Leahy Scale: Good     Standing balance support: Bilateral upper extremity supported Standing balance-Leahy Scale: Poor Standing balance comment: due to TDWB R LE                             Pertinent Vitals/Pain Pain Assessment Pain Assessment: 0-10 Pain Score: 10-Worst pain ever Pain Location: L foot Pain Descriptors / Indicators: Sharp, Shooting, Sore, Aching Pain Intervention(s): Monitored during session, Patient requesting pain meds-RN notified    Home Living Family/patient expects to be discharged to:: Private residence Living Arrangements: Other relatives;Parent Available Help at Discharge: Family Type of Home: Apartment Home Access: Level entry       Home Layout: One level Home Equipment: Shower seat;Grab bars - tub/shower;Crutches Additional Comments: was taking care of his mother after she broke her pelvic bone and now she is  back to doing everything    Prior Function Prior Level of Function : Independent/Modified Independent                     Extremity/Trunk Assessment   Upper Extremity Assessment Upper Extremity Assessment: Overall WFL for tasks assessed    Lower Extremity Assessment Lower Extremity Assessment: Overall WFL for tasks assessed        Communication   Communication Communication: No apparent difficulties    Cognition Arousal: Alert Behavior During Therapy: WFL for tasks assessed/performed   PT - Cognitive impairments: No apparent impairments                         Following commands: Intact       Cueing Cueing Techniques: Verbal cues     General Comments General comments (skin integrity, edema, etc.): Assist to don post-op shoes in sitting EOB, VSS with mobility    Exercises     Assessment/Plan    PT Assessment Patient needs continued PT services  PT Problem List Decreased activity tolerance;Decreased balance;Decreased knowledge of use of DME;Pain;Decreased range of motion;Decreased strength;Decreased mobility;Decreased safety awareness;Decreased knowledge of precautions       PT Treatment Interventions DME instruction;Therapeutic exercise;Gait training;Balance training;Manual techniques;Neuromuscular re-education;Functional mobility training;Therapeutic activities;Patient/family education    PT Goals (Current goals can be found in the Care Plan section)  Acute Rehab PT Goals Patient Stated Goal: return home to independent PT Goal Formulation: With patient Time For Goal Achievement: 04/10/24 Potential to Achieve Goals: Good    Frequency Min 2X/week     Co-evaluation               AM-PAC PT "6 Clicks" Mobility  Outcome Measure Help needed turning from your back to your side while in a flat bed without using bedrails?: None Help needed moving from lying on your back to sitting on the side of a flat bed without using bedrails?: None Help needed moving to and from a bed to a chair (including a wheelchair)?: A Little Help needed standing up from a chair using your arms (e.g., wheelchair or bedside chair)?: A Little Help needed to walk in hospital room?: A Little Help needed climbing 3-5 steps with a railing? : Total 6 Click Score: 18    End of Session Equipment Utilized  During Treatment: Gait belt Activity Tolerance: Patient limited by fatigue Patient left: in chair;with call bell/phone within reach Nurse Communication: Mobility status PT Visit Diagnosis: Other abnormalities of gait and mobility (R26.89);Difficulty in walking, not elsewhere classified (R26.2)    Time: 1610-9604 PT Time Calculation (min) (ACUTE ONLY): 26 min   Charges:   PT Evaluation $PT Eval Moderate Complexity: 1 Mod PT Treatments $Gait Training: 8-22 mins PT General Charges $$ ACUTE PT VISIT: 1 Visit         Abigail Hoff, PT Acute Rehabilitation Services [phone] 03/27/2024   Marley Simmers 03/27/2024, 3:07 PM

## 2024-03-27 NOTE — Plan of Care (Signed)
  Problem: Education: Goal: Ability to describe self-care measures that may prevent or decrease complications (Diabetes Survival Skills Education) will improve Outcome: Progressing Goal: Individualized Educational Video(s) Outcome: Progressing   Problem: Coping: Goal: Ability to adjust to condition or change in health will improve Outcome: Progressing   Problem: Fluid Volume: Goal: Ability to maintain a balanced intake and output will improve Outcome: Progressing   Problem: Health Behavior/Discharge Planning: Goal: Ability to identify and utilize available resources and services will improve Outcome: Progressing Goal: Ability to manage health-related needs will improve Outcome: Progressing   Problem: Metabolic: Goal: Ability to maintain appropriate glucose levels will improve Outcome: Progressing   Problem: Nutritional: Goal: Maintenance of adequate nutrition will improve Outcome: Progressing Goal: Progress toward achieving an optimal weight will improve Outcome: Progressing   Problem: Skin Integrity: Goal: Risk for impaired skin integrity will decrease Outcome: Progressing   Problem: Tissue Perfusion: Goal: Adequacy of tissue perfusion will improve Outcome: Progressing   Problem: Education: Goal: Knowledge of General Education information will improve Description: Including pain rating scale, medication(s)/side effects and non-pharmacologic comfort measures Outcome: Progressing   Problem: Health Behavior/Discharge Planning: Goal: Ability to manage health-related needs will improve Outcome: Progressing   Problem: Clinical Measurements: Goal: Ability to maintain clinical measurements within normal limits will improve Outcome: Progressing Goal: Will remain free from infection Outcome: Progressing Goal: Diagnostic test results will improve Outcome: Progressing Goal: Respiratory complications will improve Outcome: Progressing Goal: Cardiovascular complication will  be avoided Outcome: Progressing   Problem: Activity: Goal: Risk for activity intolerance will decrease Outcome: Progressing   Problem: Nutrition: Goal: Adequate nutrition will be maintained Outcome: Progressing   Problem: Coping: Goal: Level of anxiety will decrease Outcome: Progressing   Problem: Elimination: Goal: Will not experience complications related to bowel motility Outcome: Progressing Goal: Will not experience complications related to urinary retention Outcome: Progressing   Problem: Pain Managment: Goal: General experience of comfort will improve and/or be controlled Outcome: Progressing   Problem: Safety: Goal: Ability to remain free from injury will improve Outcome: Progressing   Problem: Skin Integrity: Goal: Risk for impaired skin integrity will decrease Outcome: Progressing   Problem: Education: Goal: Knowledge of the prescribed therapeutic regimen will improve Outcome: Progressing Goal: Ability to verbalize activity precautions or restrictions will improve Outcome: Progressing Goal: Understanding of discharge needs will improve Outcome: Progressing   Problem: Activity: Goal: Ability to perform//tolerate increased activity and mobilize with assistive devices will improve Outcome: Progressing   Problem: Clinical Measurements: Goal: Postoperative complications will be avoided or minimized Outcome: Progressing   Problem: Self-Care: Goal: Ability to meet self-care needs will improve Outcome: Progressing   Problem: Self-Concept: Goal: Ability to maintain and perform role responsibilities to the fullest extent possible will improve Outcome: Progressing   Problem: Pain Management: Goal: Pain level will decrease with appropriate interventions Outcome: Progressing   Problem: Skin Integrity: Goal: Demonstration of wound healing without infection will improve Outcome: Progressing

## 2024-03-27 NOTE — Progress Notes (Signed)
 [facility] ROUNDING NOTE [name] ZDG:[id number]  DOB: [birth year]  DOA: 03/25/2024  PCP: Joaquin Mulberry, MD  03/27/2024,10:28 AM  LOS: 2 days    Code Status: Full code   from: Home current Dispo: Unclear   58 year old black male known smoker Diabetes mellitus with previous left foot diabetic ulcer previously treated 12/2021 as well as admission 05/04/2022 with debridement by Dr. Julio Ohm at the time and refused amputation at that time after I&D-was supposed to ideally nonweightbearing patient was placed on IV antibiotics for 6 weeks and was left with a PICC line  03/2021 he presented right foot pain hyperglycemia out of insulin  X1 month-also with significant bruising right flank without fever doing dressing changes at home  sodium 128 potassium 4.0 BUN/creatinine 14/1.8 WBC 18 hemoglobin 10.8 platelet 340 Left foot x-ray showed large MTP joints cortical erosive changes involving sesamoid at head of first metatarsal suspicious for osteo probable wound ulceration at level of distal fifth metatarsal interval thick periosteal reaction suggestive chronic osteomyelitis deformity second third fourth MTP joints linear wired I can see up in the frontal views suspicious for soft tissue foreign body Right foot x-ray showed findings suspicious small volume soft tissue gas at level of first MTP proximal phalanx Blood cultures performed pending UA negative nitrites leukocytes >500 glucose MRI foot open wounds along the medial dorsal aspect of great toe with significant underlying cellulitis no discrete fluid collection gas noted in subcutaneous tissues great toe on plain films abnormal STIR signal intensity proximal phalanx of great toe suspicion for early osteo-significant severe diffuse cellulitis mild fasciitis without pyomyositis CT left foot soft tissue ulceration overlying plantar aspect first second third MTP joints no soft tissue gas suggestion joint effusion first MTP joint  4/23 underwent right first ray amputation  local tissue arrangement wound closure 4X 10 cm Kerecis graft 38 cm by Dr. 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 Julio Ohm given elevated white count of 12 switched to linezolid  600 every 12 ceftriaxone  2 g every 24 and Flagyl  500 every 12 based on prior sensitivities strep MSSA prevotella  Poorly controlled diabetes mellitus A1c 13--unclear compliance to  regimen Home meds supposed to be 22 units twice daily NPH, metformin  1 g twice daily--not good metformin  candidate Switch to NPH 10 twice daily with every 4 coverage --- CBG ranging 188-230-if CBGs are above 250 consistently will switch to home regimen Resume gabapentin  300 at bedtime   Hyponatremia secondary to hyperglycemia and osmotic diuresis Stable at this time repeat labs monitor trends   Mild hypokalemia Replace orally with K-Dur 40 recheck labs  ATN/AKI superimposed on CKD 2 Etiology could have been sepsis==Placed on fluids-increased rate to 100 cc/H Would BladderScan to ensure no retention of urine  DVT prophylaxis: SCD for now  Status is: Inpatient Remains inpatient appropriate because:   Await reliable resolution of wound, white count etc.   Subjective:  Occasional spasm in right lower extremity no chest  pain no fever no nausea Eating well Seems to be having some oozing of blood around the dressing We reinforced wearing the postop shoe As he is walking around  on the bandage itself and probably should not be weightbearing completely   Objective + exam Vitals:   03/26/24 1732 03/26/24 2131 03/27/24 0416 03/27/24 0734  BP: 97/81 133/67 (!) 112/57 124/73  Pulse: 78 84 90 93  Resp: 18 17 17 17   Temp: (!) 97.5 F (36.4 C) 98.6 F (37 C) 98.8 F (37.1 C) 98.7 F (37.1 C)  TempSrc: Oral Oral Oral   SpO2: 97% 100% 99% 98%  Weight:      Height:       Filed Weights   03/25/24 1008 03/26/24 1405  Weight: 99.8 kg 99.8 kg    Examination: EOMI NCAT no focal deficit no icterus no pallor no wheeze no rales or rhonchi Neck soft supple S1-S2 no murmur no ROM intact Right foot bandaged in Kerlix slight blood staining at the bottom of it  Data Reviewed: reviewed   CBC    Component Value Date/Time   WBC 12.3 (H) 03/27/2024 0645   RBC 3.44 (L) 03/27/2024 0645   HGB 8.9 (L) 03/27/2024 0645   HCT 27.6 (L) 03/27/2024 0645   PLT 334 03/27/2024 0645   MCV 80.2 03/27/2024 0645   MCH 25.9 (L) 03/27/2024 0645   MCHC 32.2 03/27/2024 0645   RDW 13.9 03/27/2024 0645   LYMPHSABS 1.4 03/27/2024 0645   MONOABS 1.2 (H) 03/27/2024 0645   EOSABS 0.0 03/27/2024 0645   BASOSABS 0.0 03/27/2024 0645      Latest Ref Rng & Units 03/27/2024    6:45 AM 03/26/2024    5:00 AM 03/25/2024   10:39 AM  CMP  Glucose 70 - 99 mg/dL 161  096    BUN 6 - 20 mg/dL 22  23    Creatinine 0.45 - 1.24 mg/dL 4.09  8.11    Sodium 914 - 145 mmol/L 130  131  129   Potassium 3.5 - 5.1 mmol/L 3.7  3.3  3.9   Chloride 98 - 111 mmol/L 97  97    CO2 22 - 32 mmol/L 22  23    Calcium  8.9 - 10.3 mg/dL 7.8  7.8      Scheduled Meds:  vitamin C   1,000 mg Oral Daily   insulin  aspart  0-6 Units Subcutaneous Q4H   insulin  NPH Human  10 Units Subcutaneous BID AC & HS   linezolid   600 mg Oral Q12H   methocarbamol   750 mg Oral TID   metroNIDAZOLE   500 mg Oral Q12H   nutrition supplement (JUVEN)  1 packet Oral BID BM   senna-docusate  2 tablet Oral BID   sodium chloride  flush  3 mL  Intravenous Q12H   zinc  sulfate (50mg  elemental zinc )  220 mg Oral Daily   Continuous Infusions:  sodium chloride      cefTRIAXone  (ROCEPHIN )  IV      Time 60  Verlie Glisson, MD  [facility]

## 2024-03-28 ENCOUNTER — Other Ambulatory Visit: Payer: Self-pay

## 2024-03-28 ENCOUNTER — Other Ambulatory Visit (HOSPITAL_COMMUNITY): Payer: Self-pay

## 2024-03-28 DIAGNOSIS — E11628 Type 2 diabetes mellitus with other skin complications: Secondary | ICD-10-CM | POA: Diagnosis not present

## 2024-03-28 DIAGNOSIS — L089 Local infection of the skin and subcutaneous tissue, unspecified: Secondary | ICD-10-CM | POA: Diagnosis not present

## 2024-03-28 LAB — CBC WITH DIFFERENTIAL/PLATELET
Abs Immature Granulocytes: 0 10*3/uL (ref 0.00–0.07)
Basophils Absolute: 0 10*3/uL (ref 0.0–0.1)
Basophils Relative: 0 %
Eosinophils Absolute: 0.1 10*3/uL (ref 0.0–0.5)
Eosinophils Relative: 1 %
HCT: 27.3 % — ABNORMAL LOW (ref 39.0–52.0)
Hemoglobin: 8.8 g/dL — ABNORMAL LOW (ref 13.0–17.0)
Lymphocytes Relative: 4 %
Lymphs Abs: 0.5 10*3/uL — ABNORMAL LOW (ref 0.7–4.0)
MCH: 26.1 pg (ref 26.0–34.0)
MCHC: 32.2 g/dL (ref 30.0–36.0)
MCV: 81 fL (ref 80.0–100.0)
Monocytes Absolute: 0.7 10*3/uL (ref 0.1–1.0)
Monocytes Relative: 5 %
Neutro Abs: 11.9 10*3/uL — ABNORMAL HIGH (ref 1.7–7.7)
Neutrophils Relative %: 90 %
Platelets: 353 10*3/uL (ref 150–400)
RBC: 3.37 MIL/uL — ABNORMAL LOW (ref 4.22–5.81)
RDW: 14 % (ref 11.5–15.5)
WBC: 13.2 10*3/uL — ABNORMAL HIGH (ref 4.0–10.5)
nRBC: 0 % (ref 0.0–0.2)
nRBC: 0 /100{WBCs}

## 2024-03-28 LAB — GLUCOSE, CAPILLARY
Glucose-Capillary: 206 mg/dL — ABNORMAL HIGH (ref 70–99)
Glucose-Capillary: 195 mg/dL — ABNORMAL HIGH (ref 70–99)
Glucose-Capillary: 268 mg/dL — ABNORMAL HIGH (ref 70–99)
Glucose-Capillary: 217 mg/dL — ABNORMAL HIGH (ref 70–99)

## 2024-03-28 LAB — BASIC METABOLIC PANEL WITH GFR
Anion gap: 10 (ref 5–15)
BUN: 28 mg/dL — ABNORMAL HIGH (ref 6–20)
CO2: 23 mmol/L (ref 22–32)
Calcium: 8 mg/dL — ABNORMAL LOW (ref 8.9–10.3)
Chloride: 98 mmol/L (ref 98–111)
Creatinine, Ser: 2.16 mg/dL — ABNORMAL HIGH (ref 0.61–1.24)
GFR, Estimated: 35 mL/min — ABNORMAL LOW (ref >60)
Glucose, Bld: 204 mg/dL — ABNORMAL HIGH (ref 70–99)
Potassium: 3.6 mmol/L (ref 3.5–5.1)
Sodium: 131 mmol/L — ABNORMAL LOW (ref 135–145)

## 2024-03-28 MED ORDER — METRONIDAZOLE 500 MG PO TABS
500.0000 mg | ORAL_TABLET | Freq: Two times a day (BID) | ORAL | 0 refills | Status: AC
  Filled 2024-03-28: qty 10, 5d supply, fill #0

## 2024-03-28 MED ORDER — LINEZOLID 600 MG PO TABS
600.0000 mg | ORAL_TABLET | Freq: Two times a day (BID) | ORAL | 0 refills | Status: AC
  Filled 2024-03-28: qty 5, 3d supply, fill #0

## 2024-03-28 MED ORDER — METFORMIN HCL ER 500 MG PO TB24
1000.0000 mg | ORAL_TABLET | Freq: Two times a day (BID) | ORAL | 6 refills | Status: DC
  Filled 2024-03-28: qty 120, 30d supply, fill #0

## 2024-03-28 MED ORDER — METHOCARBAMOL 750 MG PO TABS
750.0000 mg | ORAL_TABLET | Freq: Four times a day (QID) | ORAL | 0 refills | Status: DC | PRN
  Filled 2024-03-28: qty 45, 12d supply, fill #0

## 2024-03-28 NOTE — Progress Notes (Signed)
 Right dressing change per PA orders: change dressing prior to discharge with dressing consisting of 4 x 4's with Kerlix and Ace bandage over top.

## 2024-03-28 NOTE — Plan of Care (Signed)
  Problem: Education: Goal: Ability to describe self-care measures that may prevent or decrease complications (Diabetes Survival Skills Education) will improve Outcome: Progressing Goal: Individualized Educational Video(s) Outcome: Progressing   Problem: Coping: Goal: Ability to adjust to condition or change in health will improve Outcome: Progressing   Problem: Fluid Volume: Goal: Ability to maintain a balanced intake and output will improve Outcome: Progressing   Problem: Health Behavior/Discharge Planning: Goal: Ability to identify and utilize available resources and services will improve Outcome: Progressing Goal: Ability to manage health-related needs will improve Outcome: Progressing   Problem: Metabolic: Goal: Ability to maintain appropriate glucose levels will improve Outcome: Progressing   Problem: Nutritional: Goal: Maintenance of adequate nutrition will improve Outcome: Progressing Goal: Progress toward achieving an optimal weight will improve Outcome: Progressing   Problem: Skin Integrity: Goal: Risk for impaired skin integrity will decrease Outcome: Progressing   Problem: Tissue Perfusion: Goal: Adequacy of tissue perfusion will improve Outcome: Progressing   Problem: Education: Goal: Knowledge of General Education information will improve Description: Including pain rating scale, medication(s)/side effects and non-pharmacologic comfort measures Outcome: Progressing   Problem: Health Behavior/Discharge Planning: Goal: Ability to manage health-related needs will improve Outcome: Progressing   Problem: Clinical Measurements: Goal: Ability to maintain clinical measurements within normal limits will improve Outcome: Progressing Goal: Will remain free from infection Outcome: Progressing Goal: Diagnostic test results will improve Outcome: Progressing Goal: Respiratory complications will improve Outcome: Progressing Goal: Cardiovascular complication will  be avoided Outcome: Progressing   Problem: Activity: Goal: Risk for activity intolerance will decrease Outcome: Progressing   Problem: Nutrition: Goal: Adequate nutrition will be maintained Outcome: Progressing   Problem: Coping: Goal: Level of anxiety will decrease Outcome: Progressing   Problem: Elimination: Goal: Will not experience complications related to bowel motility Outcome: Progressing Goal: Will not experience complications related to urinary retention Outcome: Progressing   Problem: Pain Managment: Goal: General experience of comfort will improve and/or be controlled Outcome: Progressing   Problem: Safety: Goal: Ability to remain free from injury will improve Outcome: Progressing   Problem: Skin Integrity: Goal: Risk for impaired skin integrity will decrease Outcome: Progressing   Problem: Education: Goal: Knowledge of the prescribed therapeutic regimen will improve Outcome: Progressing Goal: Ability to verbalize activity precautions or restrictions will improve Outcome: Progressing Goal: Understanding of discharge needs will improve Outcome: Progressing   Problem: Activity: Goal: Ability to perform//tolerate increased activity and mobilize with assistive devices will improve Outcome: Progressing   Problem: Clinical Measurements: Goal: Postoperative complications will be avoided or minimized Outcome: Progressing   Problem: Self-Care: Goal: Ability to meet self-care needs will improve Outcome: Progressing   Problem: Self-Concept: Goal: Ability to maintain and perform role responsibilities to the fullest extent possible will improve Outcome: Progressing   Problem: Pain Management: Goal: Pain level will decrease with appropriate interventions Outcome: Progressing   Problem: Skin Integrity: Goal: Demonstration of wound healing without infection will improve Outcome: Progressing

## 2024-03-28 NOTE — Progress Notes (Signed)
 Refused cleaning and changing left foot dressing. He states he will do later

## 2024-03-28 NOTE — Plan of Care (Signed)
 Wound change discharge instructions given. Patient verbalized understanding. Walker ordered. And pt to pick up medications downstairs.  Problem: Education: Goal: Ability to describe self-care measures that may prevent or decrease complications (Diabetes Survival Skills Education) will improve Outcome: Adequate for Discharge Goal: Individualized Educational Video(s) Outcome: Adequate for Discharge   Problem: Coping: Goal: Ability to adjust to condition or change in health will improve Outcome: Adequate for Discharge   Problem: Fluid Volume: Goal: Ability to maintain a balanced intake and output will improve Outcome: Adequate for Discharge   Problem: Health Behavior/Discharge Planning: Goal: Ability to identify and utilize available resources and services will improve Outcome: Adequate for Discharge Goal: Ability to manage health-related needs will improve Outcome: Adequate for Discharge   Problem: Metabolic: Goal: Ability to maintain appropriate glucose levels will improve Outcome: Adequate for Discharge   Problem: Nutritional: Goal: Maintenance of adequate nutrition will improve Outcome: Adequate for Discharge Goal: Progress toward achieving an optimal weight will improve Outcome: Adequate for Discharge   Problem: Skin Integrity: Goal: Risk for impaired skin integrity will decrease Outcome: Adequate for Discharge   Problem: Tissue Perfusion: Goal: Adequacy of tissue perfusion will improve Outcome: Adequate for Discharge   Problem: Education: Goal: Knowledge of General Education information will improve Description: Including pain rating scale, medication(s)/side effects and non-pharmacologic comfort measures Outcome: Adequate for Discharge   Problem: Health Behavior/Discharge Planning: Goal: Ability to manage health-related needs will improve Outcome: Adequate for Discharge   Problem: Clinical Measurements: Goal: Ability to maintain clinical measurements within normal  limits will improve Outcome: Adequate for Discharge Goal: Will remain free from infection Outcome: Adequate for Discharge Goal: Diagnostic test results will improve Outcome: Adequate for Discharge Goal: Respiratory complications will improve Outcome: Adequate for Discharge Goal: Cardiovascular complication will be avoided Outcome: Adequate for Discharge   Problem: Activity: Goal: Risk for activity intolerance will decrease Outcome: Adequate for Discharge   Problem: Nutrition: Goal: Adequate nutrition will be maintained Outcome: Adequate for Discharge   Problem: Coping: Goal: Level of anxiety will decrease Outcome: Adequate for Discharge   Problem: Elimination: Goal: Will not experience complications related to bowel motility Outcome: Adequate for Discharge Goal: Will not experience complications related to urinary retention Outcome: Adequate for Discharge   Problem: Pain Managment: Goal: General experience of comfort will improve and/or be controlled Outcome: Adequate for Discharge   Problem: Safety: Goal: Ability to remain free from injury will improve Outcome: Adequate for Discharge   Problem: Skin Integrity: Goal: Risk for impaired skin integrity will decrease Outcome: Adequate for Discharge   Problem: Education: Goal: Knowledge of the prescribed therapeutic regimen will improve Outcome: Adequate for Discharge Goal: Ability to verbalize activity precautions or restrictions will improve Outcome: Adequate for Discharge Goal: Understanding of discharge needs will improve Outcome: Adequate for Discharge   Problem: Activity: Goal: Ability to perform//tolerate increased activity and mobilize with assistive devices will improve Outcome: Adequate for Discharge   Problem: Clinical Measurements: Goal: Postoperative complications will be avoided or minimized Outcome: Adequate for Discharge   Problem: Self-Care: Goal: Ability to meet self-care needs will  improve Outcome: Adequate for Discharge   Problem: Self-Concept: Goal: Ability to maintain and perform role responsibilities to the fullest extent possible will improve Outcome: Adequate for Discharge   Problem: Pain Management: Goal: Pain level will decrease with appropriate interventions Outcome: Adequate for Discharge   Problem: Skin Integrity: Goal: Demonstration of wound healing without infection will improve Outcome: Adequate for Discharge

## 2024-03-28 NOTE — Inpatient Diabetes Management (Signed)
 Inpatient Diabetes Program Recommendations  AACE/ADA: New Consensus Statement on Inpatient Glycemic Control (2015)  Target Ranges:  Prepandial:   less than 140 mg/dL      Peak postprandial:   less than 180 mg/dL (1-2 hours)      Critically ill patients:  140 - 180 mg/dL   Lab Results  Component Value Date   GLUCAP 195 (H) 03/28/2024   HGBA1C 13.1 (H) 03/26/2024    Review of Glycemic Control  Latest Reference Range & Units 03/27/24 07:32 03/27/24 11:18 03/27/24 16:40 03/27/24 19:38 03/27/24 23:37 03/28/24 04:04 03/28/24 07:54  Glucose-Capillary 70 - 99 mg/dL 295 (H) 621 (H) 308 (H) 242 (H) 232 (H) 206 (H) 195 (H)   Diabetes history: DM 2 Outpatient Diabetes medications: NPH 28 units bid Current orders for Inpatient glycemic control:  NPH 10 units bid Novolog  0-6 units Q4 hours  A1c 13.1%  -   Consider increasing NPH to 15 units bid  Spoke with pt at bedside on 4/24 see note for details.    Discharge Recommendations: Intermediate acting recommendations:  Humulin  N (NPH) dose TBD at time of discharge  Supply/Referral recommendations: Glucometer Test strips Lancet device Lancets Pen needles - standard   Use Adult Diabetes Insulin  Treatment Post Discharge order set.  Thanks,  Eloise Hake RN, MSN, BC-ADM Inpatient Diabetes Coordinator Team Pager [phone] (8a-5p)

## 2024-03-28 NOTE — Discharge Summary (Addendum)
 Physician Discharge Summary  Nathanial Arrighi WUJ:[id number] DOB: 09/26/1966 DOA: 03/25/2024  PCP: Joaquin Mulberry, MD  Admit date: 03/25/2024 Discharge date: 03/28/2024  Time spent: 45 minutes  Recommendations for Outpatient Follow-up:  Needs wound check Dr. 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 Julio Ohm at the time and refused amputation at that time after I&D-was supposed to ideally nonweightbearing patient was placed on IV antibiotics for 6 weeks and was left with a PICC line   03/2021 he presented right foot pain hyperglycemia out of insulin  X1 month-also with significant bruising right flank without fever doing dressing changes at home  sodium 128 potassium 4.0 BUN/creatinine 14/1.8 WBC 18 hemoglobin 10.8 platelet 340 Left foot x-ray showed large MTP joints cortical erosive changes involving sesamoid at head of first  metatarsal suspicious for osteo probable wound ulceration at level of distal fifth metatarsal interval thick periosteal reaction suggestive chronic osteomyelitis deformity second third fourth MTP joints linear wired I can see up in the frontal views suspicious for soft tissue foreign body Right foot x-ray showed findings suspicious small volume soft tissue gas at level of first MTP proximal phalanx Blood cultures performed pending UA negative nitrites leukocytes >500 glucose MRI foot open wounds along the medial dorsal aspect of great toe with significant underlying cellulitis no discrete fluid collection gas noted in subcutaneous tissues great toe on plain films abnormal STIR signal intensity proximal phalanx of great toe suspicion for early osteo-significant severe diffuse cellulitis mild fasciitis without pyomyositis CT left foot soft tissue ulceration overlying plantar aspect first second third MTP joints no soft tissue gas suggestion joint effusion first MTP joint   4/23 underwent right first ray amputation local tissue arrangement wound closure 4X 10 cm Kerecis graft 38 cm by Dr. Julio Ohm   Plan   Diabetic foot wound with gas gangrene potentially right side left great toe Feeling pain and spasm today-placed on Robaxin  750 3 times daily, replaced Oxy IR with Percocet 1-2 tab every 4 as needed moderate pain--pain is moderate patient should go home with above regimen with Tylenol  scheduled around-the-clock Blood cultures were negative --he has previously grew in 2023 strep staph and Prevotella  narrowed to linezolid  Flagyl  at discharge to complete another 5 days of both and although white count is slightly elevated he is afebrile--- this can be followed in the outpatient setting   Poorly controlled diabetes mellitus A1c 13--unclear compliance to  regimen Home meds supposed to be 22 units twice daily NPH, metformin  1 g twice daily--not good metformin  candidate overall  long-term-this may need to be  revisited as an outpatient At discharge resumed insulin  NPH 22 twice daily -- gabapentin  300 at bedtime    Hyponatremia secondary to hyperglycemia and osmotic diuresis Overall stable in the 130 range   Mild hypokalemia Replace d and resolved   ATN/AKI superimposed on CKD 2 Etiology could have been sepsis==Placed on fluids-and was at 100 cc/HD not retaining urine looks more stable creatinine slowly trending downward and expect will return to close to baseline May need to renally dose meds-would suggest outpatient discussion with nephrology depending on outpatient labs  Discharge Exam: Vitals:   03/28/24 0409 03/28/24 0757  BP: 123/70 130/82  Pulse: 89 86  Resp: 17 18  Temp: 98.8 F (37.1 C) 98.7 F (37.1 C)  SpO2: 100% 98%    Subj on day of d/c   Awake coherent no distress looks fair feels well no nausea no vomiting  General Exam on discharge  CTAB no added sound no rales rhonchi ROM intact S1-S2 no murmur Bandages on both feet I did not disturb right-sided bandage Abdomen soft no rebound  Discharge Instructions   Discharge Instructions     Diet - low sodium heart healthy   Complete by: As directed    Discharge instructions   Complete by: As directed    Make sure that you take the medication as directed and complete all of the antibiotics and the linezolid  as well as the Flagyl  to cover most above Pain control should be with Oxy IR every 4 as needed and we have also prescribed Robaxin -750 every 6 for spasm Make sure that you do not run out of your blood sugar medications and continue to use your insulin  as you have previously been told Please follow-up closely with Dr. Julio Ohm of orthopedics and have them look at your wounds etc. you will need labs in about a week or so with your primary physician It was nice to meet you Best of luck I hope that things worked out make sure you keep looking at the wound and do not let it get worse keep the dressing on until you follow-up  with them make sure you use the postop boot   Increase activity slowly   Complete by: As directed    Leave dressing on - Keep it clean, dry, and intact until clinic visit   Complete by: As directed       Allergies as of 03/28/2024       Reactions   Metformin  And Related Diarrhea        Medication List     STOP taking these medications    amoxicillin -clavulanate 875-125 MG tablet Commonly known as: AUGMENTIN        TAKE these medications    amLODipine  5 MG tablet Commonly known as: NORVASC  Take 1 tablet (5 mg total) by mouth daily.   atorvastatin  20 MG tablet Commonly known as: LIPITOR Take 1 tablet (20 mg total) by mouth daily.   Dexcom G6 Transmitter Misc Use to check blood sugar three times daily. Change transmitter once every 909 days. E11.69   gabapentin  300 MG capsule Commonly known as: NEURONTIN  Take 1 capsule (300 mg total) by mouth at bedtime.   Insulin  Pen Needle 32G X 4 MM Misc Use as directed to inject insulin  up to 4 times daily.   linezolid  600 MG tablet Commonly known as: ZYVOX  Take 1 tablet (600 mg total) by mouth every 12 (twelve) hours for 5 doses.   metFORMIN  500 MG 24  hr tablet Commonly known as: GLUCOPHAGE -XR Take 2 tablets (1,000 mg total) by mouth 2 (two) times daily with a meal.   methocarbamol  750 MG tablet Commonly known as: ROBAXIN  Take 1 tablet (750 mg total) by mouth every 6 (six) hours as needed for muscle spasms. What changed:  medication strength how much to take   metroNIDAZOLE  500 MG tablet Commonly known as: FLAGYL  Take 1 tablet (500 mg total) by mouth every 12 (twelve) hours for 5 days.   NovoLIN N FlexPen 100 UNIT/ML FlexPen Generic drug: Insulin  NPH (Human) (Isophane) Inject 22 Units into the skin 2 (two) times daily. What changed: how much to take   oxyCODONE -acetaminophen  5-325 MG tablet Commonly known as: PERCOCET/ROXICET Take 1 tablet by mouth every 4 (four) hours as needed.                Discharge Care Instructions  (From admission, onward)           Start     Ordered   03/28/24 0000  Leave dressing on - Keep it clean, dry, and intact until clinic visit        03/28/24 1333           Allergies  Allergen Reactions   Metformin  And Related Diarrhea    Follow-up Information     Timothy Ford, MD Follow up in 1 week(s).   Specialty: Orthopedic Surgery Contact information: [address] [place] [address] [phone]                  The results of significant diagnostics from this hospitalization (including imaging, microbiology, ancillary and laboratory) are listed below for reference.    Significant Diagnostic Studies: MR FOOT RIGHT WO CONTRAST Result Date: 03/25/2024 CLINICAL DATA:  Foot pain and swelling.  Diabetic foot ulcers. EXAM: MRI OF THE RIGHT FOREFOOT WITHOUT CONTRAST TECHNIQUE: Multiplanar, multisequence MR imaging of the right foot was performed. No intravenous contrast was administered. COMPARISON:  Radiographs 03/26/2019 FINDINGS: Open wounds noted in along the medial and dorsal aspect of the great toe with significant underlying cellulitis. No discrete fluid collection to suggest a drainable abscess. Gas is noted throughout the soft tissues as seen on the plain films. No findings suspicious for septic arthritis. There is abnormal STIR signal intensity in the proximal phalanx of the great toe which is suspicious for early osteomyelitis. No T1 signal abnormality. The other bony structures are intact. Severe diffuse cellulitis and myofasciitis without findings suspicious for pyomyositis. IMPRESSION: 1. Open wounds along the medial and dorsal aspect of the great toe with significant underlying cellulitis. No discrete fluid collection to suggest a drainable abscess. 2. Gas noted in the subcutaneous tissues of the great toe as seen on the plain films. 3. Abnormal STIR signal intensity in the proximal phalanx of the great toe is suspicious for  early osteomyelitis. 4. Severe diffuse cellulitis and myofasciitis without findings suspicious for pyomyositis. Electronically Signed   By: Marrian Siva M.D.   On: 03/25/2024 23:11   CT FOOT LEFT WO CONTRAST Result Date: 03/25/2024 CLINICAL DATA:  Evaluate for foreign body. EXAM: CT OF THE LEFT FOOT WITHOUT CONTRAST TECHNIQUE: Multidetector CT imaging of the left foot was performed according to the standard protocol. Multiplanar CT image reconstructions were also generated. RADIATION DOSE REDUCTION: This exam was performed according to the departmental dose-optimization program which includes automated exposure control, adjustment of the mA and/or kV according to patient size and/or use of iterative reconstruction technique. COMPARISON:  Left foot x-ray  03/25/2024 FINDINGS: Bones/Joint/Cartilage There is no acute fracture or dislocation identified. There is increased sclerosis with cortical thickening involving the distal second and third metatarsals, second proximal phalanx, third proximal phalanx, and portions of the fourth and fifth mid metatarsals. There is also cortical thickening and sclerosis of the sesamoids at the level of the first metatarsal head. There is ankylosis across the second metatarsophalangeal joint. Mild degenerative changes affect the ankle and midfoot. Plantar and posterior calcaneal spurs are present. Ligaments Suboptimally assessed by CT. Muscles and Tendons The plantar intramuscular and fascial edema present. Soft tissues There is soft tissue ulceration overlying the plantar aspect of the first, second and third metatarsophalangeal joints with associated soft tissue swelling and infiltration. There is no soft tissue gas collection. There is suggestion of joint effusion at the first metatarsophalangeal joint. No radiopaque foreign body identified. There is also subcutaneous edema over the dorsum of the foot, involving the first toe, and surrounding the ankle. IMPRESSION: 1. Soft tissue  ulceration overlying the plantar aspect of the first, second and third metatarsophalangeal joints with associated soft tissue swelling and infiltration. No soft tissue gas collection. 2. Suggestion of joint effusion at the first metatarsophalangeal joint. Correlate clinically for septic arthritis. 3. No radiopaque foreign body identified. 4. Increased sclerosis with cortical thickening involving the distal second and third metatarsals, second proximal phalanx, third proximal phalanx, and portions of the fourth and fifth mid metatarsals. Findings are nonspecific and may be related to chronic osteomyelitis. This can be better evaluated with MRI. 5. Subcutaneous edema over the dorsum of the foot, involving the first toe, and surrounding the ankle. Electronically Signed   By: Tyron Gallon M.D.   On: 03/25/2024 22:35   DG Foot Complete Right Result Date: 03/25/2024 CLINICAL DATA:  Wound EXAM: RIGHT FOOT COMPLETE - 3+ VIEW COMPARISON:  None Available. FINDINGS: No fracture or malalignment. Moderate plantar calcaneal spur and posterior enthesophyte. Vascular calcifications. No fracture or malalignment. No osseous destructive change. Findings suspicious for small volume soft tissue gas at the level of the first proximal phalanx and MTP joint. IMPRESSION: Findings suspicious for small volume soft tissue gas at the level of the first proximal phalanx and MTP joint, raising concern for necrotic infection. No osseous destructive change. Electronically Signed   By: Esmeralda Hedge M.D.   On: 03/25/2024 17:00   DG Foot Complete Left Result Date: 03/25/2024 CLINICAL DATA:  Wound multiple EXAM: LEFT FOOT - COMPLETE 3+ VIEW COMPARISON:  05/04/2022, MRI 06/29/2022 FINDINGS: No acute fracture is seen. Large wound or ulcer plantar aspect of foot at the level of the MTP joints. Cortical erosive change involving sesamoid bones at the head of first metatarsal suspicious for osteomyelitis. There is probable wound or ulceration at the  level of the distal fifth metatarsal laterally. Interval thick periosteal reaction at the fifth metatarsal. Small lucent defect at the neck of the fifth metatarsal. Deformity at the second third and fourth MTP joints, probably due to sequela of previously noted septic arthritis. Linear wire like density on the frontal views at the level of third and fourth MTP joints is indeterminate for soft tissue foreign body. IMPRESSION: 1. Large wound or ulcer plantar aspect of foot at the level of the MTP joints. Cortical erosive change involving sesamoid bones at the head of first metatarsal suspicious for osteomyelitis. 2. Probable wound or ulceration at the level of the distal fifth metatarsal laterally. Interval thick periosteal reaction at the fifth metatarsal with small lucent defect at the neck of the  fifth metatarsal possible chronic osteomyelitis. This could be further assessed with MRI 3. Deformity at the second third and fourth MTP joints, probably due to sequela of previously noted septic arthritis. 4. Linear wire like density on the frontal views at the level of third and fourth MTP joints is indeterminate for soft tissue foreign body. Electronically Signed   By: Esmeralda Hedge M.D.   On: 03/25/2024 16:58    Microbiology: Recent Results (from the past 240 hours)  Blood Cultures x 2 sites     Status: None (Preliminary result)   Collection Time: 03/25/24  5:50 PM   Specimen: BLOOD  Result Value Ref Range Status   Specimen Description BLOOD SITE NOT SPECIFIED  Final   Special Requests   Final    BOTTLES DRAWN AEROBIC AND ANAEROBIC Blood Culture results may not be optimal due to an inadequate volume of blood received in culture bottles   Culture   Final    NO GROWTH 3 DAYS Performed at [facility], [address], [place], [place] [address]    Report Status PENDING  Incomplete     Labs: Basic Metabolic Panel: Recent Labs  Lab 03/25/24 1029 03/25/24 1036 03/25/24 1039 03/26/24 0500  03/27/24 0645 03/28/24 0510  NA 128* 128* 129* 131* 130* 131*  K 4.0 3.9 3.9 3.3* 3.7 3.6  CL 93* 94*  --  97* 97* 98  CO2 21*  --   --  23 22 23   GLUCOSE 366* 370*  --  166* 186* 204*  BUN 14 16  --  23* 22* 28*  CREATININE 1.84* 1.90*  --  2.14* 2.41* 2.16*  CALCIUM  8.2*  --   --  7.8* 7.8* 8.0*  MG  --   --   --   --  2.1  --    Liver Function Tests: No results for input(s): "AST", "ALT", "ALKPHOS", "BILITOT", "PROT", "ALBUMIN" in the last 168 hours. No results for input(s): "LIPASE", "AMYLASE" in the last 168 hours. No results for input(s): "AMMONIA" in the last 168 hours. CBC: Recent Labs  Lab 03/25/24 1029 03/25/24 1036 03/25/24 1039 03/26/24 0500 03/27/24 0645 03/28/24 0510  WBC 18.0*  --   --  15.6* 12.3* 13.2*  NEUTROABS  --   --   --   --  9.5* 11.9*  HGB 10.8* 12.2* 11.9* 9.2* 8.9* 8.8*  HCT 33.7* 36.0* 35.0* 27.9* 27.6* 27.3*  MCV 82.6  --   --  80.2 80.2 81.0  PLT 340  --   --  285 334 353   Cardiac Enzymes: No results for input(s): "CKTOTAL", "CKMB", "CKMBINDEX", "TROPONINI" in the last 168 hours. BNP: BNP (last 3 results) No results for input(s): "BNP" in the last 8760 hours.  ProBNP (last 3 results) No results for input(s): "PROBNP" in the last 8760 hours.  CBG: Recent Labs  Lab 03/27/24 1938 03/27/24 2337 03/28/24 0404 03/28/24 0754 03/28/24 1138  GLUCAP 242* 232* 206* 195* 268*    Signed:  Verlie Glisson MD   [facility] 03/28/2024, 1:34 PM

## 2024-03-30 LAB — CULTURE, BLOOD (ROUTINE X 2): Culture: NO GROWTH

## 2024-03-31 ENCOUNTER — Telehealth: Payer: Self-pay

## 2024-03-31 DIAGNOSIS — S98111A Complete traumatic amputation of right great toe, initial encounter: Secondary | ICD-10-CM | POA: Insufficient documentation

## 2024-03-31 LAB — CULTURE, BLOOD (ROUTINE X 2): Culture: NO GROWTH

## 2024-03-31 NOTE — Transitions of Care (Post Inpatient/ED Visit) (Signed)
   03/31/2024  Name: [name] MRN: [id number] DOB: [birth year]  Today's TOC FU Call Status: Today's TOC FU Call Status:: Unsuccessful Call (1st Attempt) Unsuccessful Call (1st Attempt) Date: 03/31/24  Attempted to reach the patient regarding the most recent Inpatient/ED visit.  Follow Up Plan: Additional outreach attempts will be made to reach the patient to complete the Transitions of Care (Post Inpatient/ED visit) call.   Tonia Frankel RN, CCM [place]  VBCI-Population Health RN Care Manager [phone]

## 2024-03-31 NOTE — Transitions of Care (Post Inpatient/ED Visit) (Signed)
 03/31/2024  Name: [name] MRN: [id number] DOB: [birth year]  Today's TOC FU Call Status: Today's TOC FU Call Status:: Successful TOC FU Call Completed TOC FU Call Complete Date: 03/31/24 Patient's Name and Date of Birth confirmed.  Transition Care Management Follow-up Telephone Call How have you been since you were released from the hospital?: Better Any questions or concerns?:  (patient denies questions)  Items Reviewed: Did you receive and understand the discharge instructions provided?: No Medications obtained,verified, and reconciled?: Yes (Medications Reviewed) Any new allergies since your discharge?: No Dietary orders reviewed?: Yes Type of Diet Ordered:: Diabetic heart healthy Do you have support at home?: Yes People in Home [RPT]: alone Name of Support/Comfort Primary Source: patient states his neighbor would help if needed but states he normally does everything on his own - states his brother will cut his grass  Medications Reviewed Today: Medications Reviewed Today     Reviewed by Sharmaine Dearth, RN (Registered Nurse) on 03/31/24 at 1611  Med List Status: <None>   Medication Order Taking? Sig Documenting Provider Last Dose Status Informant  amLODipine  (NORVASC ) 5 MG tablet [id number] Yes Take 1 tablet (5 mg total) by mouth daily. Newlin, Enobong, MD Taking Active Self  atorvastatin  (LIPITOR) 20 MG tablet [id number] Yes Take 1 tablet (20 mg total) by mouth daily. Newlin, Enobong, MD Taking Active Self  Continuous Blood Gluc Transmit (DEXCOM G6 TRANSMITTER) MISC [id number] No Use to check blood sugar three times daily. Change transmitter once every 909 days. E11.69  Patient not taking: Reported on 03/31/2024   Newlin, Enobong, MD Not Taking Active Self           Med Note [facility], Demiah Gullickson A   Mon Mar 31, 2024  4:06 PM) Patient states he has this but currently can't find it - plans to discuss at MD appointment tomorrow   gabapentin  (NEURONTIN ) 300 MG capsule [id number] No  Take 1 capsule (300 mg total) by mouth at bedtime.  Patient not taking: Reported on 03/25/2024   Newlin, Enobong, MD Not Taking Active Self  Insulin  NPH, Human,, Isophane, (HUMULIN  N KWIKPEN) 100 UNIT/ML Michaelyn Adu [id number] Yes Inject 22 Units into the skin 2 (two) times daily.  Patient taking differently: Inject 22 Units into the skin 2 (two) times daily. Patient reports dose was 28 units prior to discharge   Newlin, Enobong, MD Taking Active Self           Med Note ([place], DARIUS E   Tue Mar 25, 2024  8:49 PM) Patient verified dose is correct   Insulin  Pen Needle 32G X 4 MM MISC [id number] No Use as directed to inject insulin  up to 4 times daily.  Patient not taking: Reported on 03/31/2024   Newlin, Enobong, MD Not Taking Active Self  linezolid  (ZYVOX ) 600 MG tablet [id number] Yes Take 1 tablet (600 mg total) by mouth every 12 (twelve) hours for 5 doses. Samtani, Jai-Gurmukh, MD Taking Active   metFORMIN  (GLUCOPHAGE -XR) 500 MG 24 hr tablet [id number] Yes Take 2 tablets (1,000 mg total) by mouth 2 (two) times daily with a meal. Samtani, Jai-Gurmukh, MD Taking Active   methocarbamol  (ROBAXIN ) 750 MG tablet [id number] Yes Take 1 tablet (750 mg total) by mouth every 6 (six) hours as needed for muscle spasms. Samtani, Jai-Gurmukh, MD Taking Active   metroNIDAZOLE  (FLAGYL ) 500 MG tablet [id number] Yes Take 1 tablet (500 mg total) by mouth every 12 (twelve) hours for 5 days. Samtani, Jai-Gurmukh, MD Taking Active   oxyCODONE -acetaminophen  (PERCOCET/ROXICET) 5-325 MG tablet  [id number] No Take 1 tablet by mouth every 4 (four) hours as needed.  Patient not taking: Reported on 03/31/2024   Timothy Ford, MD Not Taking Active            Med Note [facility], [facility] A   Mon Mar 31, 2024  4:08 PM) Patient plans to pick up from pharmacy today 03/31/24            Home Care and Equipment/Supplies: Were Home Health Services Ordered?: No Any new equipment or medical supplies ordered?: Yes Name of Medical supply  agency?: [facility] Were you able to get the equipment/medical supplies?: Yes Do you have any questions related to the use of the equipment/supplies?: No  Functional Questionnaire: Do you need assistance with bathing/showering or dressing?: No Do you need assistance with meal preparation?: No Do you need assistance with eating?: No Do you have difficulty maintaining continence: No Do you need assistance with getting out of bed/getting out of a chair/moving?: No Do you have difficulty managing or taking your medications?: No  Follow up appointments reviewed: PCP Follow-up appointment confirmed?: Yes (Care guide scheduled) Date of PCP follow-up appointment?: 03/23/24 Follow-up Provider: Dr Jennet Mode office Specialist [facility] Follow-up appointment confirmed?: Yes Date of Specialist follow-up appointment?: 04/01/24 Follow-Up Specialty Provider:: Dr Julio Ohm Do you need transportation to your follow-up appointment?: No Do you understand care options if your condition(s) worsen?: Yes-patient verbalized understanding  SDOH Interventions Today    Flowsheet Row Most Recent Value  SDOH Interventions   Food Insecurity Interventions Intervention Not Indicated  Housing Interventions Intervention Not Indicated  Transportation Interventions Intervention Not Indicated  Utilities Interventions Intervention Not Indicated       Goals Addressed             This Visit's Progress    VBCI Transitions of Care (TOC) Care Plan       Problems:  Recent Hospitalization for treatment of R great toe diabetic foot infection gas gangrene -amputation 03/26/24; poorly controlled diabetes with A1C 13.1 03/26/24     Medication management barrier Medications were reviewed with patient who states 2 of his medications are at the pharmacy and will pick them up today - A1C of 13.1 with concern if patient was taking medications correctly prior to admission  Message sent via in basket to Dr Adan Holms as follows:   I'm covering for patient's VBCI TOC RN, Arna Better who is out of the office this week. Patient reports he can not find his Dexcom and has no other glucometer. A1C 03/26/24 was 13.1 - Are you able to prescribe something for him? Also, the earliest available appointment our staff could find was with Jamal Mays, NP 04/22/24. If you are able to see him sooner, can your office please call and reschedule. He sees Dr Julio Ohm tomorrow, 04/01/24. Goal:  Over the next 30 days, the patient will not experience hospital readmission  Interventions:  Transitions of Care: Doctor Visits  - discussed the importance of doctor visits Post-op wound/incision care reviewed with patient/caregiver Reviewed Signs and symptoms of infection  Diabetes Interventions: Assessed patient's understanding of A1c goal: <7% Provided education to patient about basic DM disease process Reviewed medications with patient and discussed importance of medication adherence Assessed social determinant of health barriers Lab Results  Component Value Date   HGBA1C 13.1 (H) 03/26/2024    Patient Self Care Activities:  Attend all scheduled provider appointments Call pharmacy for medication refills 3-7 days in advance of running out of medications Call provider office  for new concerns or questions  Notify RN Care Manager of TOC call rescheduling needs Participate in Transition of Care Program/Attend TOC scheduled calls Take medications as prescribed   check feet daily for cuts, sores or redness wash and dry feet carefully every day wear comfortable, cotton socks wear comfortable, well-fitting shoes - postop shoe  Plan: Patient has appointment with Dr Julio Ohm scheduled 04/01/24 Telephone follow up appointment with care management team member scheduled for:  04/08/24 3:30pm with Arna Better  The patient has been provided with contact information for the care management team and has been advised to call with any health related questions or  concerns.          Tonia Frankel RN, CCM [place]  VBCI-Population Health RN Care Manager [phone]

## 2024-04-01 ENCOUNTER — Other Ambulatory Visit: Payer: Self-pay

## 2024-04-01 ENCOUNTER — Other Ambulatory Visit: Payer: Self-pay | Admitting: Pharmacist

## 2024-04-01 ENCOUNTER — Encounter: Payer: Self-pay | Admitting: Orthopedic Surgery

## 2024-04-01 ENCOUNTER — Ambulatory Visit (INDEPENDENT_AMBULATORY_CARE_PROVIDER_SITE_OTHER): Admitting: Orthopedic Surgery

## 2024-04-01 DIAGNOSIS — Z89411 Acquired absence of right great toe: Secondary | ICD-10-CM

## 2024-04-01 MED ORDER — ACCU-CHEK GUIDE W/DEVICE KIT
PACK | 0 refills | Status: DC
  Filled 2024-04-01: qty 1, 30d supply, fill #0

## 2024-04-01 MED ORDER — NITROGLYCERIN 0.2 MG/HR TD PT24
0.2000 mg | MEDICATED_PATCH | Freq: Every day | TRANSDERMAL | 12 refills | Status: DC
  Filled 2024-04-01: qty 30, 30d supply, fill #0

## 2024-04-01 MED ORDER — ACCU-CHEK SOFTCLIX LANCETS MISC
6 refills | Status: DC
  Filled 2024-04-01: qty 100, 30d supply, fill #0

## 2024-04-01 MED ORDER — ACCU-CHEK GUIDE TEST VI STRP
ORAL_STRIP | 6 refills | Status: AC
  Filled 2024-04-01: qty 100, 30d supply, fill #0

## 2024-04-01 NOTE — Progress Notes (Signed)
 Office Visit Note   Patient: [name]           Date of Birth: [birth year]           MRN: [id number] Visit Date: 04/01/2024              Requested by: Joaquin Mulberry, MD [address] [place] [address] [place],  [place] [address] PCP: Joaquin Mulberry, MD  Chief Complaint  Patient presents with   Right Foot - Routine Post Op    03/26/24 right great toe amputation      HPI: Patient is a 58 year old gentleman status post right great toe amputation on April 23.  Patient is weightbearing with a walker and postoperative shoes bilaterally.  Assessment & Plan: Visit Diagnoses:  1. Right great toe amputee (HCC)     Plan: Prescription is called in for nitroglycerin  patch to be applied topically in line with the third toe change daily.  Change locations daily.  Discussed the wound dehiscence and the importance of minimizing weightbearing and maximizing elevation.  Follow-Up Instructions: Return in about 1 week (around 04/08/2024).   Ortho Exam  Patient is alert, oriented, no adenopathy, well-dressed, normal affect, normal respiratory effort. Examination patient has a strong palpable dorsalis pedis pulse.  The cellulitis that extended proximal to the ankle has completely resolved.  There is dehiscence along the surgical wound without gangrenous changes.  There is no purulent drainage no signs of infection.  Most recent hemoglobin A1c is 13.1.  Patient states his brother is a amputee.  Imaging: No results found. No images are attached to the encounter.  Labs: Lab Results  Component Value Date   HGBA1C 13.1 (H) 03/26/2024   HGBA1C 10.3 (A) 12/12/2022   HGBA1C 9.6 (A) 09/11/2022   ESRSEDRATE 32 (H) 08/28/2023   ESRSEDRATE 34 (H) 10/31/2022   ESRSEDRATE 41 (H) 08/01/2022   CRP 23.8 (H) 03/25/2024   CRP 1.0 (H) 08/28/2023   CRP 0.9 10/31/2022   REPTSTATUS 03/31/2024 FINAL 03/25/2024   REPTSTATUS 03/30/2024 FINAL 03/25/2024   GRAMSTAIN  05/05/2022    RARE WBC PRESENT,BOTH PMN AND  MONONUCLEAR ABUNDANT GRAM POSITIVE COCCI ABUNDANT GRAM NEGATIVE RODS    CULT  03/25/2024    NO GROWTH 5 DAYS Performed at [facility], [address] [place], [place] [address]    CULT  03/25/2024    NO GROWTH 5 DAYS Performed at [facility], [address], [place], [place] [address]    [facility] STAPHYLOCOCCUS AUREUS 05/05/2022   LABORGA STREPTOCOCCUS GORDONII 05/05/2022   LABORGA STREPTOCOCCUS GALLOLYTICUS 05/05/2022     Lab Results  Component Value Date   ALBUMIN 3.9 08/28/2023   ALBUMIN 3.4 (L) 08/11/2023   ALBUMIN 3.5 10/31/2022    Lab Results  Component Value Date   MG 2.1 03/27/2024   MG 1.9 05/09/2022   MG 1.9 05/08/2022   No results found for: "VD25OH"  No results found for: "PREALBUMIN"    Latest Ref Rng & Units 03/28/2024    5:10 AM 03/27/2024    6:45 AM 03/26/2024    5:00 AM  CBC EXTENDED  WBC 4.0 - 10.5 K/uL 13.2  12.3  15.6   RBC 4.22 - 5.81 MIL/uL 3.37  3.44  3.48   Hemoglobin 13.0 - 17.0 g/dL 8.8  8.9  9.2   HCT 21.3 - 52.0 % 27.3  27.6  27.9   Platelets 150 - 400 K/uL 353  334  285   NEUT# 1.7 - 7.7 K/uL  11.9  9.5    Lymph# 0.7 - 4.0 K/uL 0.5  1.4       There is no height or weight on file to calculate BMI.  Orders:  No orders of the defined types were placed in this encounter.  Meds ordered this encounter  Medications   nitroGLYCERIN  (NITRODUR - DOSED IN MG/24 HR) 0.2 mg/hr patch    Sig: Place 1 patch (0.2 mg total) onto the skin daily.    Dispense:  30 patch    Refill:  12     Procedures: No procedures performed  Clinical Data: No additional findings.  ROS:  All other systems negative, except as noted in the HPI. Review of Systems  Objective: Vital Signs: There were no vitals taken for this visit.  Specialty Comments:  No specialty comments available.  PMFS History: Patient Active Problem List   Diagnosis Date Noted   Amputation of right great toe (HCC) 03/31/2024   Necrotizing soft tissue infection  03/26/2024   Hypertension associated with diabetes (HCC) 12/12/2022   Toe osteomyelitis, left (HCC) 12/12/2022   Pyogenic inflammation of bone (HCC) 06/02/2022   PICC (peripherally inserted central catheter) in place 06/02/2022   Medication management 06/02/2022   Diabetic foot infection (HCC) 06/02/2022   Smoking 06/02/2022   Tobacco abuse 05/06/2022   Hyperkalemia 05/06/2022   Type 2 diabetes mellitus (HCC) 05/05/2022   Obesity (BMI 30-39.9) 05/05/2022   Cellulitis and abscess of foot    AKI (acute kidney injury) (HCC) 05/04/2022   Injury of left foot    Diabetic foot ulcer (HCC) 12/14/2021   Past Medical History:  Diagnosis Date   Diabetes mellitus without complication (HCC)    Tuberculosis    tested positive,treated with medications.    Family History  Problem Relation Age of Onset   Colon cancer Neg Hx    Colon polyps Neg Hx    Crohn's disease Neg Hx    Esophageal cancer Neg Hx    Rectal cancer Neg Hx    Stomach cancer Neg Hx    Ulcerative colitis Neg Hx     Past Surgical History:  Procedure Laterality Date   AMPUTATION TOE Right 03/26/2024   Procedure: AMPUTATION, TOE;  Surgeon: Timothy Ford, MD;  Location: [facility] OR;  Service: Orthopedics;  Laterality: Right;  RIGHT GREAT TOE AMPUTATION   FOOT SURGERY Left    I & D EXTREMITY Left 05/05/2022   Procedure: LEFT FOOT DEBRIDEMENT;  Surgeon: Timothy Ford, MD;  Location: [facility] OR;  Service: Orthopedics;  Laterality: Left;   MYRINGOTOMY WITH TUBE PLACEMENT Bilateral 07/20/2022   Procedure: MYRINGOTOMY WITH TUBE PLACEMENT;  Surgeon: Juengel, Paul, MD;  Location: [facility];  Service: ENT;  Laterality: Bilateral;  Diabetic   TONSILLECTOMY     Social History   Occupational History   Not on file  Tobacco Use   Smoking status: Former    Current packs/day: 0.00    Average packs/day: 0.3 packs/day for 25.0 years (6.3 ttl pk-yrs)    Types: Cigarettes    Start date: 09/17/1997    Quit date: 09/17/2022    Years since  quitting: 1.5   Smokeless tobacco: Never   Tobacco comments:    "Quit"  Vaping Use   Vaping status: Never Used  Substance and Sexual Activity   Alcohol use: No   Drug use: No   Sexual activity: Yes

## 2024-04-03 ENCOUNTER — Encounter: Payer: Self-pay | Admitting: Infectious Diseases

## 2024-04-03 ENCOUNTER — Other Ambulatory Visit
Admission: RE | Admit: 2024-04-03 | Discharge: 2024-04-03 | Disposition: A | Discharge status: Home / Self Care | Source: Ambulatory Visit | Attending: Infectious Diseases | Admitting: Infectious Diseases

## 2024-04-03 ENCOUNTER — Ambulatory Visit: Attending: Infectious Diseases | Admitting: Infectious Diseases

## 2024-04-03 VITALS — BP 123/75 | HR 92 | Temp 98.2°F

## 2024-04-03 DIAGNOSIS — Z7984 Long term (current) use of oral hypoglycemic drugs: Secondary | ICD-10-CM | POA: Insufficient documentation

## 2024-04-03 DIAGNOSIS — L089 Local infection of the skin and subcutaneous tissue, unspecified: Secondary | ICD-10-CM | POA: Insufficient documentation

## 2024-04-03 DIAGNOSIS — E11628 Type 2 diabetes mellitus with other skin complications: Secondary | ICD-10-CM | POA: Insufficient documentation

## 2024-04-03 DIAGNOSIS — E1142 Type 2 diabetes mellitus with diabetic polyneuropathy: Secondary | ICD-10-CM | POA: Diagnosis not present

## 2024-04-03 DIAGNOSIS — I96 Gangrene, not elsewhere classified: Secondary | ICD-10-CM | POA: Diagnosis not present

## 2024-04-03 DIAGNOSIS — Z87891 Personal history of nicotine dependence: Secondary | ICD-10-CM | POA: Insufficient documentation

## 2024-04-03 DIAGNOSIS — Z89411 Acquired absence of right great toe: Secondary | ICD-10-CM | POA: Diagnosis not present

## 2024-04-03 DIAGNOSIS — Z794 Long term (current) use of insulin: Secondary | ICD-10-CM | POA: Diagnosis not present

## 2024-04-03 DIAGNOSIS — L0889 Other specified local infections of the skin and subcutaneous tissue: Secondary | ICD-10-CM | POA: Diagnosis present

## 2024-04-03 LAB — COMPREHENSIVE METABOLIC PANEL WITH GFR
ALT: 8 U/L (ref 0–44)
AST: 13 U/L — ABNORMAL LOW (ref 15–41)
Albumin: 2.7 g/dL — ABNORMAL LOW (ref 3.5–5.0)
Alkaline Phosphatase: 50 U/L (ref 38–126)
Anion gap: 6 (ref 5–15)
BUN: 15 mg/dL (ref 6–20)
CO2: 26 mmol/L (ref 22–32)
Calcium: 8.1 mg/dL — ABNORMAL LOW (ref 8.9–10.3)
Chloride: 102 mmol/L (ref 98–111)
Creatinine, Ser: 1.69 mg/dL — ABNORMAL HIGH (ref 0.61–1.24)
GFR, Estimated: 47 mL/min — ABNORMAL LOW (ref >60)
Glucose, Bld: 122 mg/dL — ABNORMAL HIGH (ref 70–99)
Potassium: 4 mmol/L (ref 3.5–5.1)
Sodium: 134 mmol/L — ABNORMAL LOW (ref 135–145)
Total Bilirubin: 0.3 mg/dL (ref 0.0–1.2)
Total Protein: 7.7 g/dL (ref 6.5–8.1)

## 2024-04-03 LAB — CBC WITH DIFFERENTIAL/PLATELET
Abs Immature Granulocytes: 0.08 10*3/uL — ABNORMAL HIGH (ref 0.00–0.07)
Basophils Absolute: 0 10*3/uL (ref 0.0–0.1)
Basophils Relative: 0 %
Eosinophils Absolute: 0.1 10*3/uL (ref 0.0–0.5)
Eosinophils Relative: 1 %
HCT: 30.8 % — ABNORMAL LOW (ref 39.0–52.0)
Hemoglobin: 9.6 g/dL — ABNORMAL LOW (ref 13.0–17.0)
Immature Granulocytes: 1 %
Lymphocytes Relative: 19 %
Lymphs Abs: 2.2 10*3/uL (ref 0.7–4.0)
MCH: 25.7 pg — ABNORMAL LOW (ref 26.0–34.0)
MCHC: 31.2 g/dL (ref 30.0–36.0)
MCV: 82.6 fL (ref 80.0–100.0)
Monocytes Absolute: 0.7 10*3/uL (ref 0.1–1.0)
Monocytes Relative: 6 %
Neutro Abs: 8.6 10*3/uL — ABNORMAL HIGH (ref 1.7–7.7)
Neutrophils Relative %: 73 %
Platelets: 517 10*3/uL — ABNORMAL HIGH (ref 150–400)
RBC: 3.73 MIL/uL — ABNORMAL LOW (ref 4.22–5.81)
RDW: 14.6 % (ref 11.5–15.5)
WBC: 11.8 10*3/uL — ABNORMAL HIGH (ref 4.0–10.5)
nRBC: 0 % (ref 0.0–0.2)

## 2024-04-03 LAB — SEDIMENTATION RATE: Sed Rate: 109 mm/h — ABNORMAL HIGH (ref 0–20)

## 2024-04-03 LAB — C-REACTIVE PROTEIN: CRP: 5.8 mg/dL — ABNORMAL HIGH (ref <1.0)

## 2024-04-03 MED ORDER — AMOXICILLIN-POT CLAVULANATE 875-125 MG PO TABS: 1.0000 | ORAL_TABLET | Freq: Two times a day (BID) | ORAL | 0 refills | Status: DC

## 2024-04-03 NOTE — Patient Instructions (Signed)
 During your visit, we discussed the gangrenous infection of your right great toe, which required surgical removal. We also reviewed your diabetes management and its potential impact on your infection. We addressed your concerns about the healing process and the adequacy of your antibiotic treatment.  YOUR PLAN:  -GANGRENOUS INFECTION OF RIGHT GREAT TOE: A gangrenous infection is a severe condition where body tissue dies due to a lack of blood flow or a serious bacterial infection. You had surgery to remove the affected toe, and we need to ensure the infection is fully treated. We will order a wound culture to identify the bacteria causing the infection and perform blood tests to check your infection status. You will start taking Augmentin , one pill in the morning and one in the evening.  We will adjust your antibiotics based on the culture results. Today I placed Aquacel dressing- you can continue the rest of the strip until you see  Dr. Julio Ohm for further management and dressing recommendations.  -DIABETES MELLITUS: Diabetes mellitus is a condition where your blood sugar levels are too high. Poorly controlled diabetes can lead to complications like infections. It is important to monitor your blood glucose levels regularly and follow your current diabetes management plan to prevent further issues.  INSTRUCTIONS:  Please follow up with your primary care provider and wound care specialist as scheduled. We will review the results of your wound culture and blood tests to adjust your treatment plan as needed. Contact Dr. Julio Ohm for additional management and dressing recommendations.

## 2024-04-03 NOTE — Progress Notes (Signed)
 NAME: [name]  DOB: 1966-11-15  MRN: [id number]  Date/Time: 04/03/2024 12:22 PM  Subjective:   [name] is a 58 y.o. with a history of Dm,peripheral neuropathy with left foot wound since 2023  He developed a  new gangrenous infection of the right great toe, which appeared suddenly and progressed rapidly, necessitating the removal of the toe on March 26, 2024. No culture was taken during the surgery, and he is concerned about the healing process, as it does not appear to be healing well.  He was hospitalized on March 25, 2024, due to high blood sugar levels. Post-surgery, he was prescribed metronidazole  and linezolid , taking one pill of each in the morning and evening, but he only received five or six pills of each. He questions whether this course of antibiotics is sufficient to clear the infection.  Circulation in the affected area was checked and reported as good, although he is unsure of the specific tests performed. He has been managing the wound care himself, using four by fours, gauze, and Medi honey, which he purchased online. His last blood work on March 28, 2024, showed a white blood cell count of 13.2.  He has a history of stap, Klebsiella, and group B strep infections in the past, but recent notes from an infectious disease consultation in November 2024 indicated no infection in the left foot at that time.  He has an upcoming appointment with his primary care provider and wound care. He has a h/o  diabetic foot infection -left foot- completed 5 months of antibiotic and was off  antibiotics and doing well since last time- He had moved in to help his mother and had a flare of  infection and got admitted to a hosp in [place] on 06/28/23 07/03/23 for worsening of left foot infection.  He saw infectious disease in the hospital Saw podiatrist who debrided the wound and sent bone culture from the fifth metatarsal Patient refused BKA The cultures had MRSA Klebsiella and group B strep.  He  received PICC line and the plan of giving 6 weeks of IV vancomycin  until 08/09/2023 and ceftriaxone  until 07/26/2023.  As per ID physician's note vancomycin  was changed to daptomycin because of subtherapeutic levels.  But Dapto caused increase in CK and they were following closely.  On 08/11/2023 patient came [facility] ED as the PICC line was malfunctioning now almost had fallen off and so it was removed Patient was placed on linezolid  and Augmentin  for 2 more weeks after the ED physician spoke with ID in [place]. Patient is here to see me for follow-up He also has an appointment with wound clinic and podiatrist Does not have fever or chills , Following taken from previous note PMH, FH, SH, ROS reviewed  [name] is a 58 y.o. with a history of Dm,peripheral neuropathy with left foot wound for the past 9 months followed at wound clinic   On 6/1 MR left foot showed concern for early osteomyelitis along plantar side of 5th metatarsal, extensive gas in the plantar soft tissue  Taken to OR on 6/2(Ortho, Dr. Glady Laming) for left foot debridement and found to have extensive necrotic soft tissue and muscle, tissue sent for Cx.Klebsiella oxytoca, MSSA, strep gordonii, strep cellulitis, Prevotella species beta-lactamase positive. -He refused amputation- He was followed by [facility] infectious disease was sent on 6 weeks of IV . Cefazolin  and metronidazole  and levaquin . He had a repeat MRI on 06/29/22 and showed Septic arthritis involving the second, third and fourth  MTP joints with associated osteomyelitis involving the metatarsals and proximal phalanges.   HE was restarted on Po cefadroxil , levaquin  , metronidazole - asked to go for amputation- He came to [facility] wouldn clinic and was started hyperbaric oxygen  and came to see me a month ago for 2nd opinion- HE is doing better HE took 9 sessions of HBO and stopped as he could not keep the appts He completed nearly 5 months of antibiotics  Past Medical History:   Diagnosis Date   Diabetes mellitus without complication (HCC)    Tuberculosis    tested positive,treated with medications.    Past Surgical History:  Procedure Laterality Date   AMPUTATION TOE Right 03/26/2024   Procedure: AMPUTATION, TOE;  Surgeon: Timothy Ford, MD;  Location: [facility] OR;  Service: Orthopedics;  Laterality: Right;  RIGHT GREAT TOE AMPUTATION   FOOT SURGERY Left    I & D EXTREMITY Left 05/05/2022   Procedure: LEFT FOOT DEBRIDEMENT;  Surgeon: Timothy Ford, MD;  Location: [facility] OR;  Service: Orthopedics;  Laterality: Left;   MYRINGOTOMY WITH TUBE PLACEMENT Bilateral 07/20/2022   Procedure: MYRINGOTOMY WITH TUBE PLACEMENT;  Surgeon: Mellody Sprout, MD;  Location: [facility];  Service: ENT;  Laterality: Bilateral;  Diabetic   TONSILLECTOMY      Social History   Socioeconomic History   Marital status: Single    Spouse name: Not on file   Number of children: Not on file   Years of education: Not on file   Highest education level: Not on file  Occupational History   Not on file  Tobacco Use   Smoking status: Former    Current packs/day: 0.00    Average packs/day: 0.3 packs/day for 25.0 years (6.3 ttl pk-yrs)    Types: Cigarettes    Start date: 09/17/1997    Quit date: 09/17/2022    Years since quitting: 1.5   Smokeless tobacco: Never   Tobacco comments:    "Quit"  Vaping Use   Vaping status: Never Used  Substance and Sexual Activity   Alcohol use: No   Drug use: No   Sexual activity: Yes  Other Topics Concern   Not on file  Social History Narrative   Not on file   Social Drivers of Health   Financial Resource Strain: Patient Declined (06/28/2023)   Received from [facility] Strain (CARDIA)    Difficulty of Paying Living Expenses: Patient declined  Food Insecurity: No Food Insecurity (03/31/2024)   Hunger Vital Sign    Worried About Running Out of Food in the Last Year: Never true    Ran Out of Food in  the Last Year: Never true  Transportation Needs: No Transportation Needs (03/31/2024)   PRAPARE - Administrator, Civil Service (Medical): No    Lack of Transportation (Non-Medical): No  Physical Activity: Not on file  Stress: Not on file  Social Connections: Unknown (05/24/2022)   Received from [facility]   Social Network    Social Network: Not on file  Intimate Partner Violence: Not At Risk (03/31/2024)   Humiliation, Afraid, Rape, and Kick questionnaire    Fear of Current or Ex-Partner: No    Emotionally Abused: No    Physically Abused: No    Sexually Abused: No    FH Sister, mother, grandmother - DM Brother- colon cancer  Allergies  Allergen Reactions   Metformin  And Related Diarrhea    Current Outpatient Medications  Medication  Sig Dispense Refill   Accu-Chek Softclix Lancets lancets Use to check blood sugar 3 times daily. 100 each 6   amLODipine  (NORVASC ) 5 MG tablet Take 1 tablet (5 mg total) by mouth daily. 90 tablet 1   atorvastatin  (LIPITOR) 20 MG tablet Take 1 tablet (20 mg total) by mouth daily. 90 tablet 1   Blood Glucose Monitoring Suppl (ACCU-CHEK GUIDE) w/Device KIT Use to check blood sugar 3 times daily. 1 kit 0   Continuous Blood Gluc Transmit (DEXCOM G6 TRANSMITTER) MISC Use to check blood sugar three times daily. Change transmitter once every 909 days. E11.69 1 each 1   gabapentin  (NEURONTIN ) 300 MG capsule Take 1 capsule (300 mg total) by mouth at bedtime. 30 capsule 3   glucose blood (ACCU-CHEK GUIDE TEST) test strip Use to check blood sugar 3 times daily. 100 each 6   Insulin  NPH, Human,, Isophane, (HUMULIN  N KWIKPEN) 100 UNIT/ML Kiwkpen Inject 22 Units into the skin 2 (two) times daily. (Patient taking differently: Inject 22 Units into the skin 2 (two) times daily. Patient reports dose was 28 units prior to discharge) 30 mL 5   Insulin  Pen Needle 32G X 4 MM MISC Use as directed to inject insulin  up to 4 times daily. 100 each 0    metFORMIN  (GLUCOPHAGE -XR) 500 MG 24 hr tablet Take 2 tablets (1,000 mg total) by mouth 2 (two) times daily with a meal. 120 tablet 6   methocarbamol  (ROBAXIN ) 750 MG tablet Take 1 tablet (750 mg total) by mouth every 6 (six) hours as needed for muscle spasms. 45 tablet 0   nitroGLYCERIN  (NITRODUR - DOSED IN MG/24 HR) 0.2 mg/hr patch Place 1 patch (0.2 mg total) onto the skin daily. 30 patch 12   oxyCODONE -acetaminophen  (PERCOCET/ROXICET) 5-325 MG tablet Take 1 tablet by mouth every 4 (four) hours as needed. 30 tablet 0   No current facility-administered medications for this visit.     Abtx:  Anti-infectives (From admission, onward)    None       REVIEW OF SYSTEMS:  Const: negative fever, negative chills, negative weight loss Eyes: negative diplopia or visual changes, negative eye pain ENT: negative coryza, negative sore throat Resp: negative cough, hemoptysis, dyspnea Cards: negative for chest pain, palpitations, lower extremity edema GU: negative for frequency, dysuria and hematuria GI: Negative for abdominal pain, diarrhea, bleeding, constipation Skin: negative for rash and pruritus Heme: negative for easy bruising and gum/nose bleeding ZO:XWRU foot ulcer Neurolo:negative for headaches, dizziness, vertigo, memory problems  Psych: negative for feelings of anxiety, depression  Endocrine:  diabetes- got a new pcp and on metformin  xr Allergy/Immunology- negative for any medication or food allergies ?  Objective:  VITALS:  BP 123/75   Pulse 92   Temp 98.2 F (36.8 C) (Temporal)   SpO2 96%   PHYSICAL EXAM:  General: Alert, cooperative, no distress, appears stated age.  Lungs: Clear to auscultation bilaterally. No Wheezing or Rhonchi. No rales. Heart: Regular rate and rhythm, no murmur, rub or gallop. Abdomen: Soft, non-tender,not distended. Bowel sounds normal. No masses Extremities:  rt foot- amputated great toe- dehiscence at the surgical site-slough  04/03/24   Left  foot wounds smaller , clean granulating base      08/28/23     11/30/22      10/31/22  08/29/22   08/01/22   07/10/22   07/04/22   06/20/22   05/18/22   05/09/22   05/04/22    Skin: No rashes or lesions. Or bruising Lymph:  Cervical, supraclavicular normal. Neurologic: peripheral neuropathy Pertinent Labs   ? Impression/Recommendation  New gangrenous infeciton of the rt great toe s/p amputation- dehisence of surgical site- sent culture- will start augmentin  Follow up With ortho [name] Lab stoday  DM poorly controlled with peripheral neuropathy with left foot  chronic wound with underlying osteomyelitis- debridement in June 2023- took 5 months of antibiotic Was told to get amputation, wanted to save his foot at all cost- so transferred care ot [facility] wound center- started hyperbaric oxygen - and got 9 and then stopped as he could not go there 5 times a week.  Completed cefadroxil , levaquin  and flagyl  X 4 months and prior to that nearly 8 weeks of IV Oral antibiotics stopped 10/31/22 The wound looks really good and nice granulation tissue- was getting apligraf at the wound clinic He then moved to [place] in February 2024  to take care of his mother The wound flared up to on his left foot and he was hospitalized at [name] in July 2024  and was released on vancomycin  and ceftriaxone  and then  p.o. linezolid  and p.o. Augmentin . He completed antibiotics sept/oct 2024 and was off any antibiotics since then    DM-  on metformin  XR Last Hba1c was 13  Discussed healthy eating, foot care  Discussed the management with the patient in detail Follow up 1 month. Discussed with [name]

## 2024-04-04 ENCOUNTER — Other Ambulatory Visit: Payer: Self-pay | Admitting: Infectious Diseases

## 2024-04-04 ENCOUNTER — Encounter: Payer: Self-pay | Admitting: Infectious Diseases

## 2024-04-04 MED ORDER — CIPROFLOXACIN HCL 500 MG PO TABS: 500.0000 mg | ORAL_TABLET | Freq: Two times a day (BID) | ORAL | 0 refills | Status: DC

## 2024-04-04 NOTE — Progress Notes (Signed)
 Wound culture pseudomonas- sent cipro  500mg  Po BID for 2 weeks to [facility] pharmacy Unable to reach him by phone- not able to reach his brother who is the contact, Will send my chart message

## 2024-04-07 ENCOUNTER — Other Ambulatory Visit: Payer: Self-pay

## 2024-04-07 ENCOUNTER — Telehealth: Payer: Self-pay

## 2024-04-07 LAB — AEROBIC CULTURE W GRAM STAIN (SUPERFICIAL SPECIMEN)

## 2024-04-07 NOTE — Telephone Encounter (Signed)
 I spoke to the patient advised him of lab results and advised him to stop the Augmentin  and to take the Cipro . Patient verbalized understanding.  [name], CMA

## 2024-04-07 NOTE — Telephone Encounter (Signed)
-----   Message from Alica Inks sent at 04/07/2024 12:46 PM EDT ----- Can you call him and ask him to take cipro - I sent it to the pharmacy on Friday. He can stop Augmentin  now that we have the final cultures back. It is pseudomonas and acinetobacter ----- Message ----- From: Interface, Lab In [place] Sent: 04/03/2024   1:42 PM EDT To: Alica Inks, MD

## 2024-04-08 ENCOUNTER — Telehealth: Payer: Self-pay | Admitting: *Deleted

## 2024-04-08 ENCOUNTER — Encounter: Payer: Self-pay | Admitting: *Deleted

## 2024-04-09 ENCOUNTER — Telehealth: Payer: Self-pay

## 2024-04-09 NOTE — Telephone Encounter (Signed)
 The patient has an appointment with Winda Hastings, NP at [facility] on 04/22/2024. Message received from [facility] inquiring if the patient can be seen before then.  I was able to reach the patient today.  He explained that he has been seen at [facility] but prefers to stay at [facility].   I told him that we don't have any appointments here at [facility] before 5/20.  I asked him if he wanted to go to [facility] or [facility].  He said he could go to [place] but nothing was available at [facility] before 5/20 and he then said he wants to wait until the appt 5/20 at [facility].   I explained to him about the [facility] and told him that he can call here for the [facility] schedule if he feels he needs to be seen before 5/20 and he said that was fine.   He said he has been experiencing 2 kinds of pain, one from the amputation site and the other from muscle spasms.  He has an appointment with Dr Julio Ohm tomorrow and said he  will discuss those concerns with him.  He said he has oxycodone  for pain and he used to take gabapentin  for the muscle spasms

## 2024-04-10 ENCOUNTER — Encounter: Payer: Self-pay | Admitting: *Deleted

## 2024-04-10 ENCOUNTER — Ambulatory Visit (INDEPENDENT_AMBULATORY_CARE_PROVIDER_SITE_OTHER): Admitting: Orthopedic Surgery

## 2024-04-10 DIAGNOSIS — Z89411 Acquired absence of right great toe: Secondary | ICD-10-CM

## 2024-04-11 ENCOUNTER — Encounter: Payer: Self-pay | Admitting: Orthopedic Surgery

## 2024-04-11 NOTE — Progress Notes (Addendum)
 Office Visit Note   Patient: [name]           Date of Birth: 1966/09/21           MRN: [id number] Visit Date: 04/10/2024              Requested by: Joaquin Mulberry, MD [address] [place],  [place] [address] PCP: Joaquin Mulberry, MD  Chief Complaint  Patient presents with   Right Foot - Routine Post Op    03/26/24 right great toe amputation      HPI: Patient is a 58 year old gentleman who is 2 weeks status post right great toe amputation there is wound dehiscence.  Patient is ambulating in a postoperative shoe with a walker.    Assessment & Plan: Visit Diagnoses:  1. Right great toe amputee [facility])     Plan: Will continue the nitroglycerin  patch continue Vashe dressing changes.  Patient states he has Medihoney at home and he may try this instead.  Follow-Up Instructions: Return in about 2 weeks (around 04/24/2024).   Ortho Exam  Patient is alert, oriented, no adenopathy, well-dressed, normal affect, normal respiratory effort. Examination patient has dehiscence of the wound that is 10 cm x 1 cm and 1 cm deep.  He has a palpable dorsalis pedis pulse with microcirculatory deficit.  Most recent hemoglobin A1c 13.1.  Imaging: No results found. No images are attached to the encounter.  Labs: Lab Results  Component Value Date   HGBA1C 13.1 (H) 03/26/2024   HGBA1C 10.3 (A) 12/12/2022   HGBA1C 9.6 (A) 09/11/2022   ESRSEDRATE 109 (H) 04/03/2024   ESRSEDRATE 32 (H) 08/28/2023   ESRSEDRATE 34 (H) 10/31/2022   CRP 5.8 (H) 04/03/2024   CRP 23.8 (H) 03/25/2024   CRP 1.0 (H) 08/28/2023   REPTSTATUS 04/07/2024 FINAL 04/03/2024   GRAMSTAIN  04/03/2024    RARE WBC PRESENT, PREDOMINANTLY PMN FEW GRAM NEGATIVE RODS Performed at [facility], [address] [place], [place] [address]    CULT  04/03/2024    FEW PSEUDOMONAS AERUGINOSA RARE ACINETOBACTER CALCOACETICUS/BAUMANNII COMPLEX    LABORGA PSEUDOMONAS AERUGINOSA 04/03/2024   LABORGA ACINETOBACTER  CALCOACETICUS/BAUMANNII COMPLEX 04/03/2024     Lab Results  Component Value Date   ALBUMIN 2.7 (L) 04/03/2024   ALBUMIN 3.9 08/28/2023   ALBUMIN 3.4 (L) 08/11/2023    Lab Results  Component Value Date   MG 2.1 03/27/2024   MG 1.9 05/09/2022   MG 1.9 05/08/2022   No results found for: "VD25OH"  No results found for: "PREALBUMIN"    Latest Ref Rng & Units 04/03/2024    1:27 PM 03/28/2024    5:10 AM 03/27/2024    6:45 AM  CBC EXTENDED  WBC 4.0 - 10.5 K/uL 11.8  13.2  12.3   RBC 4.22 - 5.81 MIL/uL 3.73  3.37  3.44   Hemoglobin 13.0 - 17.0 g/dL 9.6  8.8  8.9   HCT 86.5 - 52.0 % 30.8  27.3  27.6   Platelets 150 - 400 K/uL 517  353  334   NEUT# 1.7 - 7.7 K/uL 8.6  11.9  9.5   Lymph# 0.7 - 4.0 K/uL 2.2  0.5  1.4      There is no height or weight on file to calculate BMI.  Orders:  No orders of the defined types were placed in this encounter.  No orders of the defined types were placed in this encounter.    Procedures: No procedures performed  Clinical Data: No additional findings.  ROS:  All other systems negative, except as noted in the HPI. Review of Systems  Objective: Vital Signs: There were no vitals taken for this visit.  Specialty Comments:  No specialty comments available.  PMFS History: Patient Active Problem List   Diagnosis Date Noted   Amputation of right great toe (HCC) 03/31/2024   Necrotizing soft tissue infection 03/26/2024   Hypertension associated with diabetes (HCC) 12/12/2022   Toe osteomyelitis, left (HCC) 12/12/2022   Pyogenic inflammation of bone (HCC) 06/02/2022   PICC (peripherally inserted central catheter) in place 06/02/2022   Medication management 06/02/2022   Diabetic foot infection (HCC) 06/02/2022   Smoking 06/02/2022   Tobacco abuse 05/06/2022   Hyperkalemia 05/06/2022   Type 2 diabetes mellitus (HCC) 05/05/2022   Obesity (BMI 30-39.9) 05/05/2022   Cellulitis and abscess of foot    AKI (acute kidney injury) (HCC)  05/04/2022   Injury of left foot    Diabetic foot ulcer (HCC) 12/14/2021   Past Medical History:  Diagnosis Date   Diabetes mellitus without complication (HCC)    Tuberculosis    tested positive,treated with medications.    Family History  Problem Relation Age of Onset   Colon cancer Neg Hx    Colon polyps Neg Hx    Crohn's disease Neg Hx    Esophageal cancer Neg Hx    Rectal cancer Neg Hx    Stomach cancer Neg Hx    Ulcerative colitis Neg Hx     Past Surgical History:  Procedure Laterality Date   AMPUTATION TOE Right 03/26/2024   Procedure: AMPUTATION, TOE;  Surgeon: Timothy Ford, MD;  Location[facility] OR;  Service: Orthopedics;  Laterality: Right;  RIGHT GREAT TOE AMPUTATION   FOOT SURGERY Left    I & D EXTREMITY Left 05/05/2022   Procedure: LEFT FOOT DEBRIDEMENT;  Surgeon: Timothy Ford, MD;  Location: [facility] OR;  Service: Orthopedics;  Laterality: Left;   MYRINGOTOMY WITH TUBE PLACEMENT Bilateral 07/20/2022   Procedure: MYRINGOTOMY WITH TUBE PLACEMENT;  Surgeon: Juengel, Paul, MD;  Location: [facility];  Service: ENT;  Laterality: Bilateral;  Diabetic   TONSILLECTOMY     Social History   Occupational History   Not on file  Tobacco Use   Smoking status: Former    Current packs/day: 0.00    Average packs/day: 0.3 packs/day for 25.0 years (6.3 ttl pk-yrs)    Types: Cigarettes    Start date: 09/17/1997    Quit date: 09/17/2022    Years since quitting: 1.5   Smokeless tobacco: Never   Tobacco comments:    "Quit"  Vaping Use   Vaping status: Never Used  Substance and Sexual Activity   Alcohol use: No   Drug use: No   Sexual activity: Yes

## 2024-04-16 ENCOUNTER — Other Ambulatory Visit: Payer: Self-pay | Admitting: Orthopedic Surgery

## 2024-04-16 ENCOUNTER — Other Ambulatory Visit: Payer: Self-pay

## 2024-04-16 ENCOUNTER — Encounter: Payer: Self-pay | Admitting: *Deleted

## 2024-04-16 MED ORDER — OXYCODONE-ACETAMINOPHEN 5-325 MG PO TABS
1.0000 | ORAL_TABLET | ORAL | 0 refills | Status: DC | PRN
  Filled 2024-04-16: qty 30, 5d supply, fill #0

## 2024-04-17 ENCOUNTER — Emergency Department

## 2024-04-17 ENCOUNTER — Other Ambulatory Visit: Payer: Self-pay

## 2024-04-17 ENCOUNTER — Inpatient Hospital Stay
Admission: EM | Admit: 2024-04-17 | Discharge: 2024-04-29 | DRG: 464 | Disposition: A | Discharge status: Home Health | Attending: Student | Admitting: Student

## 2024-04-17 ENCOUNTER — Ambulatory Visit: Attending: Infectious Diseases | Admitting: Infectious Diseases

## 2024-04-17 ENCOUNTER — Encounter: Payer: Self-pay | Admitting: Infectious Diseases

## 2024-04-17 VITALS — BP 143/81 | HR 100 | Temp 98.6°F

## 2024-04-17 DIAGNOSIS — B9562 Methicillin resistant Staphylococcus aureus infection as the cause of diseases classified elsewhere: Secondary | ICD-10-CM | POA: Diagnosis present

## 2024-04-17 DIAGNOSIS — T8781 Dehiscence of amputation stump: Secondary | ICD-10-CM | POA: Diagnosis present

## 2024-04-17 DIAGNOSIS — E1121 Type 2 diabetes mellitus with diabetic nephropathy: Secondary | ICD-10-CM | POA: Diagnosis not present

## 2024-04-17 DIAGNOSIS — T8740 Infection of amputation stump, unspecified extremity: Secondary | ICD-10-CM | POA: Diagnosis present

## 2024-04-17 DIAGNOSIS — E1159 Type 2 diabetes mellitus with other circulatory complications: Secondary | ICD-10-CM | POA: Diagnosis present

## 2024-04-17 DIAGNOSIS — E1122 Type 2 diabetes mellitus with diabetic chronic kidney disease: Secondary | ICD-10-CM | POA: Diagnosis present

## 2024-04-17 DIAGNOSIS — E538 Deficiency of other specified B group vitamins: Secondary | ICD-10-CM | POA: Diagnosis present

## 2024-04-17 DIAGNOSIS — Z888 Allergy status to other drugs, medicaments and biological substances status: Secondary | ICD-10-CM

## 2024-04-17 DIAGNOSIS — T8130XA Disruption of wound, unspecified, initial encounter: Secondary | ICD-10-CM | POA: Diagnosis not present

## 2024-04-17 DIAGNOSIS — Y838 Other surgical procedures as the cause of abnormal reaction of the patient, or of later complication, without mention of misadventure at the time of the procedure: Secondary | ICD-10-CM | POA: Diagnosis not present

## 2024-04-17 DIAGNOSIS — I96 Gangrene, not elsewhere classified: Secondary | ICD-10-CM

## 2024-04-17 DIAGNOSIS — E1142 Type 2 diabetes mellitus with diabetic polyneuropathy: Secondary | ICD-10-CM | POA: Diagnosis present

## 2024-04-17 DIAGNOSIS — Z89412 Acquired absence of left great toe: Secondary | ICD-10-CM

## 2024-04-17 DIAGNOSIS — Z794 Long term (current) use of insulin: Secondary | ICD-10-CM

## 2024-04-17 DIAGNOSIS — L97519 Non-pressure chronic ulcer of other part of right foot with unspecified severity: Secondary | ICD-10-CM | POA: Diagnosis present

## 2024-04-17 DIAGNOSIS — E66811 Obesity, class 1: Secondary | ICD-10-CM | POA: Diagnosis present

## 2024-04-17 DIAGNOSIS — E559 Vitamin D deficiency, unspecified: Secondary | ICD-10-CM | POA: Diagnosis present

## 2024-04-17 DIAGNOSIS — L089 Local infection of the skin and subcutaneous tissue, unspecified: Secondary | ICD-10-CM | POA: Diagnosis present

## 2024-04-17 DIAGNOSIS — Z7984 Long term (current) use of oral hypoglycemic drugs: Secondary | ICD-10-CM

## 2024-04-17 DIAGNOSIS — E11628 Type 2 diabetes mellitus with other skin complications: Secondary | ICD-10-CM

## 2024-04-17 DIAGNOSIS — T8130XD Disruption of wound, unspecified, subsequent encounter: Secondary | ICD-10-CM | POA: Diagnosis not present

## 2024-04-17 DIAGNOSIS — E11621 Type 2 diabetes mellitus with foot ulcer: Secondary | ICD-10-CM | POA: Diagnosis present

## 2024-04-17 DIAGNOSIS — Z89411 Acquired absence of right great toe: Secondary | ICD-10-CM | POA: Diagnosis present

## 2024-04-17 DIAGNOSIS — R7881 Bacteremia: Secondary | ICD-10-CM | POA: Diagnosis present

## 2024-04-17 DIAGNOSIS — B965 Pseudomonas (aeruginosa) (mallei) (pseudomallei) as the cause of diseases classified elsewhere: Secondary | ICD-10-CM | POA: Diagnosis present

## 2024-04-17 DIAGNOSIS — D509 Iron deficiency anemia, unspecified: Secondary | ICD-10-CM | POA: Diagnosis present

## 2024-04-17 DIAGNOSIS — D649 Anemia, unspecified: Secondary | ICD-10-CM | POA: Diagnosis present

## 2024-04-17 DIAGNOSIS — I152 Hypertension secondary to endocrine disorders: Secondary | ICD-10-CM

## 2024-04-17 DIAGNOSIS — Z6832 Body mass index (BMI) 32.0-32.9, adult: Secondary | ICD-10-CM

## 2024-04-17 DIAGNOSIS — T8149XA Infection following a procedure, other surgical site, initial encounter: Principal | ICD-10-CM

## 2024-04-17 DIAGNOSIS — L97529 Non-pressure chronic ulcer of other part of left foot with unspecified severity: Secondary | ICD-10-CM | POA: Diagnosis present

## 2024-04-17 DIAGNOSIS — K5900 Constipation, unspecified: Secondary | ICD-10-CM | POA: Diagnosis not present

## 2024-04-17 DIAGNOSIS — E1152 Type 2 diabetes mellitus with diabetic peripheral angiopathy with gangrene: Secondary | ICD-10-CM | POA: Diagnosis present

## 2024-04-17 DIAGNOSIS — Y835 Amputation of limb(s) as the cause of abnormal reaction of the patient, or of later complication, without mention of misadventure at the time of the procedure: Secondary | ICD-10-CM | POA: Diagnosis present

## 2024-04-17 DIAGNOSIS — T8743 Infection of amputation stump, right lower extremity: Principal | ICD-10-CM | POA: Diagnosis present

## 2024-04-17 DIAGNOSIS — E1140 Type 2 diabetes mellitus with diabetic neuropathy, unspecified: Secondary | ICD-10-CM | POA: Diagnosis not present

## 2024-04-17 DIAGNOSIS — N1831 Chronic kidney disease, stage 3a: Secondary | ICD-10-CM | POA: Diagnosis present

## 2024-04-17 DIAGNOSIS — M84674A Pathological fracture in other disease, right foot, initial encounter for fracture: Secondary | ICD-10-CM | POA: Diagnosis present

## 2024-04-17 DIAGNOSIS — L02611 Cutaneous abscess of right foot: Secondary | ICD-10-CM | POA: Diagnosis present

## 2024-04-17 DIAGNOSIS — M86171 Other acute osteomyelitis, right ankle and foot: Secondary | ICD-10-CM | POA: Diagnosis not present

## 2024-04-17 DIAGNOSIS — Z87891 Personal history of nicotine dependence: Secondary | ICD-10-CM

## 2024-04-17 DIAGNOSIS — E1129 Type 2 diabetes mellitus with other diabetic kidney complication: Secondary | ICD-10-CM | POA: Diagnosis present

## 2024-04-17 DIAGNOSIS — B961 Klebsiella pneumoniae [K. pneumoniae] as the cause of diseases classified elsewhere: Secondary | ICD-10-CM | POA: Diagnosis present

## 2024-04-17 DIAGNOSIS — Z79899 Other long term (current) drug therapy: Secondary | ICD-10-CM

## 2024-04-17 DIAGNOSIS — E669 Obesity, unspecified: Secondary | ICD-10-CM

## 2024-04-17 DIAGNOSIS — Z1612 Extended spectrum beta lactamase (ESBL) resistance: Secondary | ICD-10-CM | POA: Diagnosis not present

## 2024-04-17 DIAGNOSIS — E785 Hyperlipidemia, unspecified: Secondary | ICD-10-CM | POA: Diagnosis not present

## 2024-04-17 DIAGNOSIS — M89771 Major osseous defect, right ankle and foot: Secondary | ICD-10-CM | POA: Diagnosis present

## 2024-04-17 DIAGNOSIS — E1169 Type 2 diabetes mellitus with other specified complication: Secondary | ICD-10-CM | POA: Diagnosis present

## 2024-04-17 DIAGNOSIS — A480 Gas gangrene: Secondary | ICD-10-CM | POA: Diagnosis present

## 2024-04-17 LAB — URINALYSIS, W/ REFLEX TO CULTURE (INFECTION SUSPECTED)
Bilirubin Urine: NEGATIVE
Glucose, UA: NEGATIVE mg/dL
Hgb urine dipstick: NEGATIVE
Ketones, ur: NEGATIVE mg/dL
Leukocytes,Ua: NEGATIVE
Nitrite: NEGATIVE
Protein, ur: NEGATIVE mg/dL
Specific Gravity, Urine: 1.014 (ref 1.005–1.030)
pH: 5 (ref 5.0–8.0)

## 2024-04-17 LAB — COMPREHENSIVE METABOLIC PANEL WITH GFR
ALT: 13 U/L (ref 0–44)
AST: 12 U/L — ABNORMAL LOW (ref 15–41)
Albumin: 2.7 g/dL — ABNORMAL LOW (ref 3.5–5.0)
Alkaline Phosphatase: 55 U/L (ref 38–126)
Anion gap: 9 (ref 5–15)
BUN: 13 mg/dL (ref 6–20)
CO2: 26 mmol/L (ref 22–32)
Calcium: 8.4 mg/dL — ABNORMAL LOW (ref 8.9–10.3)
Chloride: 98 mmol/L (ref 98–111)
Creatinine, Ser: 1.38 mg/dL — ABNORMAL HIGH (ref 0.61–1.24)
GFR, Estimated: 60 mL/min — ABNORMAL LOW (ref >60)
Glucose, Bld: 130 mg/dL — ABNORMAL HIGH (ref 70–99)
Potassium: 4.5 mmol/L (ref 3.5–5.1)
Sodium: 133 mmol/L — ABNORMAL LOW (ref 135–145)
Total Bilirubin: 0.6 mg/dL (ref 0.0–1.2)
Total Protein: 7.6 g/dL (ref 6.5–8.1)

## 2024-04-17 LAB — CBC WITH DIFFERENTIAL/PLATELET
Abs Immature Granulocytes: 0.04 10*3/uL (ref 0.00–0.07)
Basophils Absolute: 0 10*3/uL (ref 0.0–0.1)
Basophils Relative: 0 %
Eosinophils Absolute: 0.2 10*3/uL (ref 0.0–0.5)
Eosinophils Relative: 2 %
HCT: 29.2 % — ABNORMAL LOW (ref 39.0–52.0)
Hemoglobin: 9 g/dL — ABNORMAL LOW (ref 13.0–17.0)
Immature Granulocytes: 0 %
Lymphocytes Relative: 14 %
Lymphs Abs: 1.4 10*3/uL (ref 0.7–4.0)
MCH: 25.3 pg — ABNORMAL LOW (ref 26.0–34.0)
MCHC: 30.8 g/dL (ref 30.0–36.0)
MCV: 82 fL (ref 80.0–100.0)
Monocytes Absolute: 0.8 10*3/uL (ref 0.1–1.0)
Monocytes Relative: 8 %
Neutro Abs: 7.8 10*3/uL — ABNORMAL HIGH (ref 1.7–7.7)
Neutrophils Relative %: 76 %
Platelets: 383 10*3/uL (ref 150–400)
RBC: 3.56 MIL/uL — ABNORMAL LOW (ref 4.22–5.81)
RDW: 14.1 % (ref 11.5–15.5)
WBC: 10.2 10*3/uL (ref 4.0–10.5)
nRBC: 0 % (ref 0.0–0.2)

## 2024-04-17 LAB — LACTIC ACID, PLASMA
Lactic Acid, Venous: 0.9 mmol/L (ref 0.5–1.9)
Lactic Acid, Venous: 0.8 mmol/L (ref 0.5–1.9)

## 2024-04-17 LAB — SEDIMENTATION RATE: Sed Rate: 109 mm/h — ABNORMAL HIGH (ref 0–20)

## 2024-04-17 LAB — GLUCOSE, CAPILLARY: Glucose-Capillary: 138 mg/dL — ABNORMAL HIGH (ref 70–99)

## 2024-04-17 LAB — APTT: aPTT: 39 s — ABNORMAL HIGH (ref 24–36)

## 2024-04-17 LAB — PROTIME-INR
INR: 1.1 (ref 0.8–1.2)
Prothrombin Time: 14.4 s (ref 11.4–15.2)

## 2024-04-17 MED ORDER — HYDRALAZINE HCL 20 MG/ML IJ SOLN
5.0000 mg | INTRAMUSCULAR | Status: DC | PRN
  Administered 2024-04-20: 5 mg via INTRAVENOUS
  Filled 2024-04-17: qty 1

## 2024-04-17 MED ORDER — INSULIN NPH (HUMAN) (ISOPHANE) 100 UNIT/ML ~~LOC~~ SUSP
16.0000 [IU] | Freq: Two times a day (BID) | SUBCUTANEOUS | Status: DC
  Administered 2024-04-18 – 2024-04-20 (×5): 16 [IU] via SUBCUTANEOUS
  Filled 2024-04-17: qty 10

## 2024-04-17 MED ORDER — OXYCODONE-ACETAMINOPHEN 5-325 MG PO TABS
1.0000 | ORAL_TABLET | ORAL | Status: DC | PRN
  Administered 2024-04-18 (×2): 1 via ORAL
  Filled 2024-04-17 (×3): qty 1

## 2024-04-17 MED ORDER — INSULIN ASPART 100 UNIT/ML IJ SOLN
0.0000 [IU] | Freq: Three times a day (TID) | INTRAMUSCULAR | Status: DC
  Administered 2024-04-18: 2 [IU] via SUBCUTANEOUS
  Administered 2024-04-19 (×3): 7 [IU] via SUBCUTANEOUS
  Administered 2024-04-20: 5 [IU] via SUBCUTANEOUS
  Filled 2024-04-17 (×4): qty 1

## 2024-04-17 MED ORDER — OXYCODONE-ACETAMINOPHEN 5-325 MG PO TABS
1.0000 | ORAL_TABLET | Freq: Once | ORAL | Status: AC
  Administered 2024-04-17: 1 via ORAL
  Filled 2024-04-17: qty 1

## 2024-04-17 MED ORDER — ATORVASTATIN CALCIUM 20 MG PO TABS
20.0000 mg | ORAL_TABLET | Freq: Every day | ORAL | Status: DC
  Administered 2024-04-18 – 2024-04-29 (×12): 20 mg via ORAL
  Filled 2024-04-17 (×12): qty 1

## 2024-04-17 MED ORDER — ONDANSETRON HCL 4 MG/2ML IJ SOLN
4.0000 mg | Freq: Three times a day (TID) | INTRAMUSCULAR | Status: DC | PRN
  Administered 2024-04-23: 4 mg via INTRAVENOUS

## 2024-04-17 MED ORDER — PIPERACILLIN-TAZOBACTAM 3.375 G IVPB
3.3750 g | Freq: Three times a day (TID) | INTRAVENOUS | Status: AC
Start: 2024-04-18 — End: 2024-04-22
  Administered 2024-04-18 – 2024-04-22 (×12): 3.375 g via INTRAVENOUS
  Filled 2024-04-17 (×12): qty 50

## 2024-04-17 MED ORDER — HEPARIN SODIUM (PORCINE) 5000 UNIT/ML IJ SOLN
5000.0000 [IU] | Freq: Three times a day (TID) | INTRAMUSCULAR | Status: DC
  Administered 2024-04-17 – 2024-04-29 (×33): 5000 [IU] via SUBCUTANEOUS
  Filled 2024-04-17 (×34): qty 1

## 2024-04-17 MED ORDER — LINEZOLID 600 MG/300ML IV SOLN
600.0000 mg | Freq: Once | INTRAVENOUS | Status: AC
  Administered 2024-04-17: 600 mg via INTRAVENOUS
  Filled 2024-04-17: qty 300

## 2024-04-17 MED ORDER — PIPERACILLIN-TAZOBACTAM 3.375 G IVPB
3.3750 g | Freq: Once | INTRAVENOUS | Status: AC
  Administered 2024-04-17: 3.375 g via INTRAVENOUS
  Filled 2024-04-17: qty 50

## 2024-04-17 MED ORDER — MORPHINE SULFATE (PF) 2 MG/ML IV SOLN
2.0000 mg | INTRAVENOUS | Status: DC | PRN
  Administered 2024-04-18 – 2024-04-25 (×8): 2 mg via INTRAVENOUS
  Filled 2024-04-17 (×8): qty 1

## 2024-04-17 MED ORDER — INSULIN ASPART 100 UNIT/ML IJ SOLN
0.0000 [IU] | Freq: Every day | INTRAMUSCULAR | Status: DC
  Administered 2024-04-18 – 2024-04-19 (×2): 4 [IU] via SUBCUTANEOUS
  Filled 2024-04-17 (×2): qty 1

## 2024-04-17 MED ORDER — GABAPENTIN 300 MG PO CAPS
300.0000 mg | ORAL_CAPSULE | Freq: Every day | ORAL | Status: DC
  Administered 2024-04-18: 300 mg via ORAL
  Filled 2024-04-17: qty 1

## 2024-04-17 MED ORDER — LINEZOLID 600 MG/300ML IV SOLN
600.0000 mg | Freq: Two times a day (BID) | INTRAVENOUS | Status: AC
  Administered 2024-04-18 – 2024-04-21 (×7): 600 mg via INTRAVENOUS
  Filled 2024-04-17 (×7): qty 300

## 2024-04-17 MED ORDER — ACETAMINOPHEN 325 MG PO TABS
650.0000 mg | ORAL_TABLET | Freq: Four times a day (QID) | ORAL | Status: DC | PRN
  Administered 2024-04-17 – 2024-04-18 (×2): 650 mg via ORAL
  Filled 2024-04-17 (×2): qty 2

## 2024-04-17 NOTE — ED Triage Notes (Signed)
 Pt states recent amputation of great right toe on April 23,2025. Reports now having foul odor, discharge from site

## 2024-04-17 NOTE — Progress Notes (Signed)
 Pharmacy Antibiotic Note  [name] is a 58 y.o. male admitted on 04/17/2024 with diabeteic wound infection.  Pharmacy has been consulted for Zosyn  dosing.  -sent to ED from infectious disease outpt visit 5/15 -Per ID office visit note 5/15: New gangrenous infeciton of the rt great toe s/p amputation on 03/26/24- dehiscence of wound - -pseudomonas in culture from 5/1-          not responding to cipro  - worse now Pt needs to go to ED for MRI, IV antibiotic, Wound bedridment and cultures -also ordered Linezolid   Plan: Zosyn  3.375g IV q8h (4 hour infusion).   Monitor renal fxn, cultures, length of therapy   Height: 5\' 9"  (175.3 cm) Weight: 99.8 kg (220 lb) IBW/kg (Calculated) : 70.7  Temp (24hrs), Avg:98.6 F (37 C), Min:98.5 F (36.9 C), Max:98.7 F (37.1 C)  Recent Labs  Lab 04/17/24 1302 04/17/24 1540  WBC 10.2  --   CREATININE 1.38*  --   LATICACIDVEN 0.9 0.8    Estimated Creatinine Clearance: 68.7 mL/min (A) (by C-G formula based on SCr of 1.38 mg/dL (H)).    Allergies  Allergen Reactions   Metformin  And Related Diarrhea    Antimicrobials this admission: linezolid  5/15 >>   Zosyn  5/15 >>    Dose adjustments this admission:    Microbiology results: 5/15 BCx: pending   UCx:      Sputum:      MRSA PCR:    Thank you for allowing pharmacy to be a part of this patient's care.  Imad Shostak A 04/17/2024 10:17 PM

## 2024-04-17 NOTE — Progress Notes (Signed)
 NAME: [name]  DOB: June 23, 1966  MRN: [id number]  Date/Time: 04/17/2024 12:30 PM  Subjective:  Follow up visit for rt foot infection Last saw him on 04/03/24- the foot is looking worse inspite of PO quinolone History as follows  [name] is a 58 y.o. with a history of Dm,peripheral neuropathy with left foot wound since 2023  He developed a  new gangrenous infection of the right great toe, which appeared suddenly and progressed rapidly, necessitating the removal of the toe on March 26, 2024. No culture was taken during the surgery, and he is concerned about the healing process, as it does not appear to be healing well.  He was hospitalized on March 25, 2024, due to high blood sugar levels. He had nec fasci of the rt great toe and foot and [name] did a ray excision The surgical wound dehisced and patient as been managing the wound care himself, using four by fours, gauze, and Medi honey, which he purchased online.  His last blood work on March 28, 2024, showed a white blood cell count of 13.2. whenI saw him on 5/1 I did culture and it was pseudomonas and he wa splaced on Cipro  HE says he has not had good care to the rt foot He does not want to go back to See [name]      PMH He has a h/o  diabetic foot infection -left foot- completed 5 months of antibiotic and was off  antibiotics and doing well since last time- He had moved in to help his mother and had a flare of  infection and got admitted to a hosp in [place] on 06/28/23 07/03/23 for worsening of left foot infection.  He saw infectious disease in the hospital Saw podiatrist who debrided the wound and sent bone culture from the fifth metatarsal Patient refused BKA The cultures had MRSA Klebsiella and group B strep.  He received PICC line and the plan of giving 6 weeks of IV vancomycin  until 08/09/2023 and ceftriaxone  until 07/26/2023.  As per ID physician's note vancomycin  was changed to daptomycin because of subtherapeutic levels.  But  Dapto caused increase in CK and they were following closely.  On 08/11/2023 patient came to [facility] ED as the PICC line was malfunctioning now almost had fallen off and so it was removed Patient was placed on linezolid  and Augmentin  for 2 more weeks after the ED physician spoke with ID in [place]. Patient is here to see me for follow-up He also has an appointment with wound clinic and podiatrist Does not have fever or chills , Following taken from previous note PMH, FH, SH, ROS reviewed  [name] is a 58 y.o. with a history of Dm,peripheral neuropathy with left foot wound for the past 9 months followed at wound clinic   On 6/1 MR left foot showed concern for early osteomyelitis along plantar side of 5th metatarsal, extensive gas in the plantar soft tissue  Taken to OR on 6/2(Ortho, Dr. Glady Laming) for left foot debridement and found to have extensive necrotic soft tissue and muscle, tissue sent for Cx.Klebsiella oxytoca, MSSA, strep gordonii, strep cellulitis, Prevotella species beta-lactamase positive. -He refused amputation- He was followed by [facility] infectious disease was sent on 6 weeks of IV . Cefazolin  and metronidazole  and levaquin . He had a repeat MRI on 06/29/22 and showed Septic arthritis involving the second, third and fourth MTP joints with associated osteomyelitis involving the metatarsals and proximal phalanges.   HE was restarted on Po cefadroxil ,  levaquin  , metronidazole - asked to go for amputation- He came to [facility] wound  clinic and was started hyperbaric oxygen  and came to see me a month ago for 2nd opinion- HE took 9 sessions of HBO and stopped as he could not keep the appts He completed  5 months of antibiotics for the left foot  Past Medical History:  Diagnosis Date   Diabetes mellitus without complication (HCC)    Tuberculosis    tested positive,treated with medications.    Past Surgical History:  Procedure Laterality Date   AMPUTATION TOE Right 03/26/2024    Procedure: AMPUTATION, TOE;  Surgeon: Timothy Ford, MD;  Location: [facility] OR;  Service: Orthopedics;  Laterality: Right;  RIGHT GREAT TOE AMPUTATION   FOOT SURGERY Left    I & D EXTREMITY Left 05/05/2022   Procedure: LEFT FOOT DEBRIDEMENT;  Surgeon: Timothy Ford, MD;  Location[facility] OR;  Service: Orthopedics;  Laterality: Left;   MYRINGOTOMY WITH TUBE PLACEMENT Bilateral 07/20/2022   Procedure: MYRINGOTOMY WITH TUBE PLACEMENT;  Surgeon: Mellody Sprout, MD;  Location: [facility];  Service: ENT;  Laterality: Bilateral;  Diabetic   TONSILLECTOMY      Social History   Socioeconomic History   Marital status: Single    Spouse name: Not on file   Number of children: Not on file   Years of education: Not on file   Highest education level: Not on file  Occupational History   Not on file  Tobacco Use   Smoking status: Former    Current packs/day: 0.00    Average packs/day: 0.3 packs/day for 25.0 years (6.3 ttl pk-yrs)    Types: Cigarettes    Start date: 09/17/1997    Quit date: 09/17/2022    Years since quitting: 1.5   Smokeless tobacco: Never   Tobacco comments:    "Quit"  Vaping Use   Vaping status: Never Used  Substance and Sexual Activity   Alcohol use: No   Drug use: No   Sexual activity: Yes  Other Topics Concern   Not on file  Social History Narrative   Not on file   Social Drivers of Health   Financial Resource Strain: Patient Declined (06/28/2023)   Received from [facility] [employer] [employer] Strain (CARDIA)    Difficulty of Paying Living Expenses: Patient declined  Food Insecurity: No Food Insecurity (03/31/2024)   Hunger Vital Sign    Worried About Running Out of Food in the Last Year: Never true    Ran Out of Food in the Last Year: Never true  Transportation Needs: No Transportation Needs (03/31/2024)   PRAPARE - Administrator, Civil Service (Medical): No    Lack of Transportation (Non-Medical): No  Physical Activity:  Not on file  Stress: Not on file  Social Connections: Unknown (05/24/2022)   Received from [facility]   Social Network    Social Network: Not on file  Intimate Partner Violence: Not At Risk (03/31/2024)   Humiliation, Afraid, Rape, and Kick questionnaire    Fear of Current or Ex-Partner: No    Emotionally Abused: No    Physically Abused: No    Sexually Abused: No    FH Sister, mother, grandmother - DM Brother- colon cancer  Allergies  Allergen Reactions   Metformin  And Related Diarrhea    Current Outpatient Medications  Medication Sig Dispense Refill   Accu-Chek Softclix Lancets lancets Use to check blood sugar 3 times daily. 100  each 6   amLODipine  (NORVASC ) 5 MG tablet Take 1 tablet (5 mg total) by mouth daily. 90 tablet 1   atorvastatin  (LIPITOR) 20 MG tablet Take 1 tablet (20 mg total) by mouth daily. 90 tablet 1   Blood Glucose Monitoring Suppl (ACCU-CHEK GUIDE) w/Device KIT Use to check blood sugar 3 times daily. 1 kit 0   ciprofloxacin  (CIPRO ) 500 MG tablet Take 1 tablet (500 mg total) by mouth 2 (two) times daily. 30 tablet 0   Continuous Blood Gluc Transmit (DEXCOM G6 TRANSMITTER) MISC Use to check blood sugar three times daily. Change transmitter once every 909 days. E11.69 1 each 1   gabapentin  (NEURONTIN ) 300 MG capsule Take 1 capsule (300 mg total) by mouth at bedtime. 30 capsule 3   glucose blood (ACCU-CHEK GUIDE TEST) test strip Use to check blood sugar 3 times daily. 100 each 6   Insulin  NPH, Human,, Isophane, (HUMULIN  N KWIKPEN) 100 UNIT/ML Kiwkpen Inject 22 Units into the skin 2 (two) times daily. (Patient taking differently: Inject 22 Units into the skin 2 (two) times daily. Patient reports dose was 28 units prior to discharge) 30 mL 5   Insulin  Pen Needle 32G X 4 MM MISC Use as directed to inject insulin  up to 4 times daily. 100 each 0   metFORMIN  (GLUCOPHAGE -XR) 500 MG 24 hr tablet Take 2 tablets (1,000 mg total) by mouth 2 (two) times daily with  a meal. 120 tablet 6   methocarbamol  (ROBAXIN ) 750 MG tablet Take 1 tablet (750 mg total) by mouth every 6 (six) hours as needed for muscle spasms. 45 tablet 0   nitroGLYCERIN  (NITRODUR - DOSED IN MG/24 HR) 0.2 mg/hr patch Place 1 patch (0.2 mg total) onto the skin daily. 30 patch 12   oxyCODONE -acetaminophen  (PERCOCET/ROXICET) 5-325 MG tablet Take 1 tablet by mouth every 4 (four) hours as needed. 30 tablet 0   No current facility-administered medications for this visit.     Abtx:  Anti-infectives (From admission, onward)    None       REVIEW OF SYSTEMS:  Const: negative fever, negative chills, negative weight loss Eyes: negative diplopia or visual changes, negative eye pain ENT: negative coryza, negative sore throat Resp: negative cough, hemoptysis, dyspnea Cards: negative for chest pain, palpitations, lower extremity edema GU: negative for frequency, dysuria and hematuria GI: Negative for abdominal pain, diarrhea, bleeding, constipation Skin: negative for rash and pruritus Heme: negative for easy bruising and gum/nose bleeding ZH:YQMV foot ulcer Neurolo:negative for headaches, dizziness, vertigo, memory problems  Psych: negative for feelings of anxiety, depression  Endocrine:  diabetes- got a new pcp and on metformin  xr Allergy/Immunology- negative for any medication or food allergies ?  Objective:  VITALS:  BP (!) 143/81   Pulse 100   Temp 98.6 F (37 C) (Temporal)   SpO2 96%   PHYSICAL EXAM:  General: Alert, cooperative, no distress, appears stated age.  Lungs: Clear to auscultation bilaterally. No Wheezing or Rhonchi. No rales. Heart: Regular rate and rhythm, no murmur, rub or gallop. Abdomen: Soft, non-tender,not distended. Bowel sounds normal. No masses Extremities:  rt foot- amputated great toe- dehiscence at the surgical site-slough 04/17/24       04/03/24    Left foot 04/17/24       04/03/24 Left foot wounds smaller , clean granulating  base      08/28/23     11/30/22      10/31/22  08/29/22   08/01/22   07/10/22   07/04/22  06/20/22   05/18/22   05/09/22   05/04/22    Skin: No rashes or lesions. Or bruising Lymph: Cervical, supraclavicular normal. Neurologic: peripheral neuropathy Pertinent Labs   ? Impression/Recommendation  New gangrenous infeciton of the rt great toe s/p amputation on 03/26/24- dehiscence of wound - -pseudomonas in culture from 5/1- not responding to cipro  - worse noiw Pt needs to go to ED for MRI, IV antibiotic, Wound bedridment and cultures Pt does not want to go to his surgical provider [name] at [facility]- will be referred to [facility] ED     DM poorly controlled with peripheral neuropathy    Chronic wounds left foot  with underlying osteomyelitis- debridement in June 2023- took 5 months of antibiotic Was told to get amputation, wanted to save his foot at all cost- so transferred care ot [facility] wound center- started hyperbaric oxygen - and got 9 and then stopped as he could not go there 5 times a week.  Completed cefadroxil , levaquin  and flagyl  X 4 months and prior to that nearly 8 weeks of IV Oral antibiotics stopped 10/31/22 The wound looks really good and nice granulation tissue- was getting apligraf at the wound clinic He then moved to [place] in February 2024  to take care of his mother The wound flared up to on his left foot and he was hospitalized at [facility] in July 2024  and was released on vancomycin  and ceftriaxone  and then  p.o. linezolid  and p.o. Augmentin . He completed antibiotics sept/oct 2024 and was off any antibiotics since then until his new infection on the rt foot in April 2025  It is stable now    DM-  on metformin  XR Last Hba1c was 13  Discussed healthy eating, foot care  Pt is referred to ED for admission Spoke to Dr.Stafford Pt taken to the ED

## 2024-04-17 NOTE — Patient Instructions (Signed)
 Today, you were seen for a severe infection in your right foot, which has rapidly worsened over the past two days. You have been experiencing significant pain, swelling, and a foul smell from the affected area. Immediate medical intervention is necessary to prevent further complications.  YOUR PLAN:  -GANGRENE OF RIGHT FOOT: Gangrene is a serious condition where body tissue dies due to a lack of blood supply, often leading to infection. You will be admitted to the hospital for comprehensive management, including intravenous antibiotics, an MRI to check the condition of the bone, and consultations with vascular surgery and podiatry for potential surgical intervention. A dressing will be applied to the affected area.  -INFECTION OF RIGHT FOOT: The infection in your right foot is severe and has not improved with current antibiotics, likely due to I a combination of DM, neuropathy, poor wound care and may be microcirculation issues. You will receive intravenous antibiotics and an MRI to evaluate the bone condition. Consultations with podiatry for potential surgical intervention will also be arranged.    INSTRUCTIONS:  You will be admitted to the hospital for  treatment. Please follow the instructions provided by the hospital staff and attend all scheduled consultations and imaging studies. Your care team will include specialists in vascular surgery and podiatry to ensure comprehensive management of your condition.

## 2024-04-17 NOTE — H&P (Signed)
 History and Physical    Randal Wiesehan ZHY:[id number] DOB: 25-Nov-1966 DOA: 04/17/2024  Referring MD/NP/PA:   PCP: Joaquin Mulberry, MD   Patient coming from:  The patient is coming from home.     Chief Complaint: Right foot wound and right foot pain  HPI: [name] is a 58 y.o. male with medical history significant of hypertension, hyperlipidemia, diabetes mellitus, CKD-3A, former smoker, obesity, diabetic foot ulcer, who presents with right foot wound and right foot pain.  Patient was recently hospitalized from 4/22 - 4/25 due to diabetic foot infection with gangrenous change. Patient is s/p of right greater toe first ray amputation. Pt has been on Cipro  as cultures from 5/1 showed Pseudomonas, but his wound is getting worse, now has dehiscence of wound.  Patient has further pain which is constant, 7 out of 10 in severity, aching, nonradiating.  No fever or chills.  Patient does not have chest pain, cough, SOB.  No nausea, vomiting, diarrhea or abdominal pain.  No symptoms of UTI.   Patient was seen by Dr. Francee Inch of ID today. She recommended patient to go to ED for MRI, IV antibiotic, Wound bedridment and cultures.  Dr. Fawn Hooks tooks many pictures of both feet (please see the picture in Dr. Sherryle Don note).  Data reviewed independently and ED Course: pt was found to have WBC 10.2, GFR> 60, lactic acid 0.9 --> 0.8, temperature normal, blood pressure 134/84, heart rate 100, RR 18, oxygen  saturation 97% on room air.  Patient is admitted to MedSurg bed as inpatient.  Dr. Althea Atkinson of podiatry is consulted by EDP.  MRI-right foot: 1. Status post interval amputation of the first ray to the level of the medial cuneiform. There is a large postsurgical soft tissue wound/defect extending along the medial dorsal midfoot through forefoot. Heterogenous fluid signal with foci of susceptibility artifact within the soft tissues overlying the distal cortex of the medial cuneiform measuring up to 2.9 x  2.0 cm, could reflect organizing phlegmonous change/abscess formation and foci of soft tissue air, which may be postsurgical, however, gas-forming infection can not be excluded. There is underlying periostitis and marrow edema of the medial cuneiform, osteomyelitis can not be excluded. 2. This wound extends deep and laterally just superficial to medial margin of the distal second metatarsal. There is T2/STIR hyperintense marrow signal abnormality with corresponding confluent T1 hypointensity of the second metatarsal, second proximal phalanx, and the base of second middle phalanx, concerning for osteomyelitis. 3. Marrow edema with subtle corresponding T1 hypointensity of the base of third proximal phalanx and mild marrow edema of the head of third metatarsal, could reflect reactive marrow changes, however, osteomyelitis can not be excluded. 4. Diffuse edema of the intrinsic musculature of the foot, concerning for myositis.   EKG: Not done in ED, will get one.   Review of Systems:   General: no fevers, chills, no body weight gain, has fatigue HEENT: no blurry vision, hearing changes or sore throat Respiratory: no dyspnea, coughing, wheezing CV: no chest pain, no palpitations GI: no nausea, vomiting, abdominal pain, diarrhea, constipation GU: no dysuria, burning on urination, increased urinary frequency, hematuria  Ext: no leg edema Neuro: no unilateral weakness, numbness, or tingling, no vision change or hearing loss Skin: has foot ulcer and wound in both feet MSK: No muscle spasm, no deformity, no limitation of range of movement in spin Heme: No easy bruising.  Travel history: No recent long distant travel.   Allergy:  Allergies  Allergen Reactions   Metformin  And Related  Diarrhea    Past Medical History:  Diagnosis Date   Diabetes mellitus without complication (HCC)    Tuberculosis    tested positive,treated with medications.    Past Surgical History:  Procedure  Laterality Date   AMPUTATION TOE Right 03/26/2024   Procedure: AMPUTATION, TOE;  Surgeon: Timothy Ford, MD;  Location[facility] OR;  Service: Orthopedics;  Laterality: Right;  RIGHT GREAT TOE AMPUTATION   FOOT SURGERY Left    I & D EXTREMITY Left 05/05/2022   Procedure: LEFT FOOT DEBRIDEMENT;  Surgeon: Timothy Ford, MD;  Location: [facility] OR;  Service: Orthopedics;  Laterality: Left;   MYRINGOTOMY WITH TUBE PLACEMENT Bilateral 07/20/2022   Procedure: MYRINGOTOMY WITH TUBE PLACEMENT;  Surgeon: Juengel, Paul, MD;  Location: [facility];  Service: ENT;  Laterality: Bilateral;  Diabetic   TONSILLECTOMY      Social History:  reports that he quit smoking about 19 months ago. His smoking use included cigarettes. He started smoking about 26 years ago. He has a 6.3 pack-year smoking history. He has never used smokeless tobacco. He reports that he does not drink alcohol and does not use drugs.  Family History:  Family History  Problem Relation Age of Onset   Colon cancer Neg Hx    Colon polyps Neg Hx    Crohn's disease Neg Hx    Esophageal cancer Neg Hx    Rectal cancer Neg Hx    Stomach cancer Neg Hx    Ulcerative colitis Neg Hx      Prior to Admission medications   Medication Sig Start Date End Date Taking? Authorizing Provider  Accu-Chek Softclix Lancets lancets Use to check blood sugar 3 times daily. 04/01/24   Newlin, Enobong, MD  amLODipine  (NORVASC ) 5 MG tablet Take 1 tablet (5 mg total) by mouth daily. 12/12/22   Newlin, Enobong, MD  atorvastatin  (LIPITOR) 20 MG tablet Take 1 tablet (20 mg total) by mouth daily. 12/12/22   Newlin, Enobong, MD  Blood Glucose Monitoring Suppl (ACCU-CHEK GUIDE) w/Device KIT Use to check blood sugar 3 times daily. 04/01/24   Newlin, Enobong, MD  ciprofloxacin  (CIPRO ) 500 MG tablet Take 1 tablet (500 mg total) by mouth 2 (two) times daily. 04/04/24   Alica Inks, MD  Continuous Blood Gluc Transmit (DEXCOM G6 TRANSMITTER) MISC Use to check blood sugar  three times daily. Change transmitter once every 909 days. E11.69 06/05/22   Newlin, Enobong, MD  gabapentin  (NEURONTIN ) 300 MG capsule Take 1 capsule (300 mg total) by mouth at bedtime. 09/11/22   Newlin, Enobong, MD  glucose blood (ACCU-CHEK GUIDE TEST) test strip Use to check blood sugar 3 times daily. 04/01/24   Newlin, Enobong, MD  Insulin  NPH, Human,, Isophane, (HUMULIN  N KWIKPEN) 100 UNIT/ML Kiwkpen Inject 22 Units into the skin 2 (two) times daily. Patient taking differently: Inject 22 Units into the skin 2 (two) times daily. Patient reports dose was 28 units prior to discharge 12/12/22   Newlin, Enobong, MD  Insulin  Pen Needle 32G X 4 MM MISC Use as directed to inject insulin  up to 4 times daily. 11/21/22   Newlin, Enobong, MD  metFORMIN  (GLUCOPHAGE -XR) 500 MG 24 hr tablet Take 2 tablets (1,000 mg total) by mouth 2 (two) times daily with a meal. 03/28/24   Samtani, Jai-Gurmukh, MD  methocarbamol  (ROBAXIN ) 750 MG tablet Take 1 tablet (750 mg total) by mouth every 6 (six) hours as needed for muscle spasms. 03/28/24   Samtani, Jai-Gurmukh, MD  nitroGLYCERIN  (NITRODUR - DOSED IN  MG/24 HR) 0.2 mg/hr patch Place 1 patch (0.2 mg total) onto the skin daily. 04/01/24   Timothy Ford, MD  oxyCODONE -acetaminophen  (PERCOCET/ROXICET) 5-325 MG tablet Take 1 tablet by mouth every 4 (four) hours as needed. 04/16/24   Timothy Ford, MD    Physical Exam: Vitals:   04/17/24 1900 04/17/24 2010 04/17/24 2130 04/17/24 2227  BP: 134/84  131/71 (!) 154/86  Pulse: 85  81 90  Resp:   18 20  Temp:  98.7 F (37.1 C)  99.9 F (37.7 C)  TempSrc:  Oral  Oral  SpO2: 97%  97% 100%  Weight:      Height:       General: Not in acute distress HEENT:       Eyes: PERRL, EOMI, no jaundice       ENT: No discharge from the ears and nose, no pharynx injection, no tonsillar enlargement.        Neck: No JVD, no bruit, no mass felt. Heme: No neck lymph node enlargement. Cardiac: S1/S2, RRR, No murmurs, No gallops or  rubs. Respiratory: No rales, wheezing, rhonchi or rubs. GI: Soft, nondistended, nontender, no rebound pain, no organomegaly, BS present. GU: No hematuria Ext: No pitting leg edema bilaterally. 1+DP/PT pulse bilaterally. Musculoskeletal: No joint deformities, No joint redness or warmth, no limitation of ROM in spin. Skin: Please see Dr. Squire Dyers clinic note for many pictures -Right foot: Patient has right great toe amputation, with dehiscent wound -Left foot: Patient has 2 wound in plantar area with granulating base  Neuro: Alert, oriented X3, cranial nerves II-XII grossly intact, moves all extremities normally Psych: Patient is not psychotic, no suicidal or hemocidal ideation.  Labs on Admission: I have personally reviewed following labs and imaging studies  CBC: Recent Labs  Lab 04/17/24 1302  WBC 10.2  NEUTROABS 7.8*  HGB 9.0*  HCT 29.2*  MCV 82.0  PLT 383   Basic Metabolic Panel: Recent Labs  Lab 04/17/24 1302  NA 133*  K 4.5  CL 98  CO2 26  GLUCOSE 130*  BUN 13  CREATININE 1.38*  CALCIUM  8.4*   GFR: Estimated Creatinine Clearance: 68.7 mL/min (A) (by C-G formula based on SCr of 1.38 mg/dL (H)). Liver Function Tests: Recent Labs  Lab 04/17/24 1302  AST 12*  ALT 13  ALKPHOS 55  BILITOT 0.6  PROT 7.6  ALBUMIN 2.7*   No results for input(s): "LIPASE", "AMYLASE" in the last 168 hours. No results for input(s): "AMMONIA" in the last 168 hours. Coagulation Profile: Recent Labs  Lab 04/17/24 1302  INR 1.1   Cardiac Enzymes: No results for input(s): "CKTOTAL", "CKMB", "CKMBINDEX", "TROPONINI" in the last 168 hours. BNP (last 3 results) No results for input(s): "PROBNP" in the last 8760 hours. HbA1C: No results for input(s): "HGBA1C" in the last 72 hours. CBG: Recent Labs  Lab 04/17/24 2230  GLUCAP 138*   Lipid Profile: No results for input(s): "CHOL", "HDL", "LDLCALC", "TRIG", "CHOLHDL", "LDLDIRECT" in the last 72 hours. Thyroid Function  Tests: No results for input(s): "TSH", "T4TOTAL", "FREET4", "T3FREE", "THYROIDAB" in the last 72 hours. Anemia Panel: No results for input(s): "VITAMINB12", "FOLATE", "FERRITIN", "TIBC", "IRON", "RETICCTPCT" in the last 72 hours. Urine analysis:    Component Value Date/Time   COLORURINE YELLOW (A) 04/17/2024 1302   APPEARANCEUR CLEAR (A) 04/17/2024 1302   LABSPEC 1.014 04/17/2024 1302   PHURINE 5.0 04/17/2024 1302   GLUCOSEU NEGATIVE 04/17/2024 1302   HGBUR NEGATIVE 04/17/2024 1302   BILIRUBINUR NEGATIVE 04/17/2024  1302   KETONESUR NEGATIVE 04/17/2024 1302   PROTEINUR NEGATIVE 04/17/2024 1302   UROBILINOGEN 0.2 11/10/2010 1558   NITRITE NEGATIVE 04/17/2024 1302   LEUKOCYTESUR NEGATIVE 04/17/2024 1302   Sepsis Labs: @LABRCNTIP (procalcitonin:4,lacticidven:4) )No results found for this or any previous visit (from the past 240 hours).   Radiological Exams on Admission:   Assessment/Plan Principal Problem:   Diabetic foot infection (HCC) Active Problems:   Hypertension associated with diabetes (HCC)   HLD (hyperlipidemia)   Chronic kidney disease, stage 3a (HCC)   Type II diabetes mellitus with renal manifestations (HCC)   Obesity (BMI 30-39.9)   Normocytic anemia   Assessment and Plan:  Diabetic foot infection (HCC):  pt is s/p of right greater toe first ray amputation. His wound is getting worse, now has dehiscence of wound. MRI shows extensive findings including phlegmon, possible osteomyelitis, and the presence of some gas.  Patient does not have fever or leukocytosis.  Lactic acid is normal.  Clinically not septic.  Consulted Dr. Althea Atkinson of podiatry.  - will admit to med-surg bed as inpatient - Empiric antimicrobial treatment with Zosyn  and linezolid  - PRN Zofran  for nausea, and Tylenol , morphine  and Percocet for pain - Blood cultures x 2 and wound culture - ESR and CRP - wound care consult - INR/PTT/type & screen - NPO after MN  Hypertension associated with  diabetes (HCC), patient is not taking amlodipine .  Blood pressure 134/84 -IV hydralazine as needed  HLD (hyperlipidemia) -Lipitor  Chronic kidney disease, stage 3a (HCC): Renal function stable.  Recent baseline creatinine 1.69 on 04/03/2024.  His creatinine is 1.38, BUN 13, GFR 60. - Follow-up with BMP  Type II diabetes mellitus with renal manifestations [facility]): Recent A1c 5.8, well-controlled.  Patient taking metformin  and NPH insulin  22 units twice daily -Sliding scale insulin  - NPH insulin  16 units twice daily  Obesity (BMI 30-39.9): Body weight 99.8 kg, BMI 32.49 -Encourage losing weight - Exercise and healthy diet  Normocytic anemia: Hemoglobin stable.  Hemoglobin 9.0 (9.6 on 04/03/2024) --Follow-up by CBC       DVT ppx: SQ Heparin   Code Status: Full code   Family Communication:     not done, no family member is at bed side.     Disposition Plan:  Anticipate discharge back to previous environment  Consults called:  Dr. Althea Atkinson of podiatry is consulted by EDP.  Admission status and Level of care: Med-Surg:    for obs as inpt        Dispo: The patient is from: Home              Anticipated d/c is to: Home              Anticipated d/c date is: 2 days              Patient currently is not medically stable to d/c.    Severity of Illness:  The appropriate patient status for this patient is INPATIENT. Inpatient status is judged to be reasonable and necessary in order to provide the required intensity of service to ensure the patient's safety. The patient's presenting symptoms, physical exam findings, and initial radiographic and laboratory data in the context of their chronic comorbidities is felt to place them at high risk for further clinical deterioration. Furthermore, it is not anticipated that the patient will be medically stable for discharge from the hospital within 2 midnights of admission.   * I certify that at the point of admission it is my clinical  judgment that  the patient will require inpatient hospital care spanning beyond 2 midnights from the point of admission due to high intensity of service, high risk for further deterioration and high frequency of surveillance required.*       Date of Service 04/18/2024    Fidencio Hue [facility]   If 7PM-7AM, please contact night-coverage [email or web address] 04/18/2024, 12:19 AM

## 2024-04-17 NOTE — ED Provider Notes (Signed)
 [facility] Provider Note    Event Date/Time   First MD Initiated Contact with Patient 04/17/24 1529     (approximate)   History   Foot Pain   HPI  [name] is a 58 y.o. male with history of diabetes, diabetic ulcer, cellulitis, and toe amputation who presents with worsening pain along with a malodorous discharge from a right foot wound over the last few weeks.  The patient had an amputation of his right great toe last month.  He has been on Cipro  as cultures from 5/1 showed Pseudomonas.  He states that the wound is not improving.  He denies any fever or chills.  I reviewed the past medical records.  The patient was admitted to the hospitalist service at [facility] last month with a foot wound and imaging concerning for gas gangrene.  He had a amputation on 4/23 by orthopedics.  He subsequently saw Dr. Francee Inch from infectious disease today, who referred him to the ED.  Her note states the following:   "New gangrenous infeciton of the rt great toe s/p amputation on 03/26/24- dehiscence of wound - -pseudomonas in culture from 5/1- not responding to cipro  - worse noiw Pt needs to go to ED for MRI, IV antibiotic, Wound bedridment and cultures Pt does not want to go to his surgical provider [name] at [facility]- will be referred to [facility] ED"   Physical Exam   Triage Vital Signs: ED Triage Vitals  Encounter Vitals Group     BP 04/17/24 1254 131/76     Systolic BP Percentile --      Diastolic BP Percentile --      Pulse Rate 04/17/24 1254 98     Resp 04/17/24 1254 16     Temp 04/17/24 1304 98.5 F (36.9 C)     Temp Source 04/17/24 1304 Oral     SpO2 04/17/24 1254 98 %     Weight 04/17/24 1255 220 lb (99.8 kg)     Height 04/17/24 1255 5\' 9"  (1.753 m)     Head Circumference --      Peak Flow --      Pain Score 04/17/24 1254 8     Pain Loc --      Pain Education --      Exclude from Growth Chart --     Most recent vital signs: Vitals:   04/17/24  2130 04/17/24 2227  BP: 131/71 (!) 154/86  Pulse: 81 90  Resp: 18 20  Temp:  99.9 F (37.7 C)  SpO2: 97% 100%     General: Awake, no distress.  CV:  Good peripheral perfusion.  Resp:  Normal effort.  Abd:  No distention.  Other:  Motor intact in all extremities.   ED Results / Procedures / Treatments   Labs (all labs ordered are listed, but only abnormal results are displayed) Labs Reviewed  COMPREHENSIVE METABOLIC PANEL WITH GFR - Abnormal; Notable for the following components:      Result Value   Sodium 133 (*)    Glucose, Bld 130 (*)    Creatinine, Ser 1.38 (*)    Calcium  8.4 (*)    Albumin 2.7 (*)    AST 12 (*)    GFR, Estimated 60 (*)    All other components within normal limits  CBC WITH DIFFERENTIAL/PLATELET - Abnormal; Notable for the following components:   RBC 3.56 (*)    Hemoglobin 9.0 (*)    HCT 29.2 (*)  MCH 25.3 (*)    Neutro Abs 7.8 (*)    All other components within normal limits  URINALYSIS, W/ REFLEX TO CULTURE (INFECTION SUSPECTED) - Abnormal; Notable for the following components:   Color, Urine YELLOW (*)    APPearance CLEAR (*)    Bacteria, UA RARE (*)    All other components within normal limits  APTT - Abnormal; Notable for the following components:   aPTT 39 (*)    All other components within normal limits  GLUCOSE, CAPILLARY - Abnormal; Notable for the following components:   Glucose-Capillary 138 (*)    All other components within normal limits  SEDIMENTATION RATE - Abnormal; Notable for the following components:   Sed Rate 109 (*)    All other components within normal limits  CULTURE, BLOOD (ROUTINE X 2)  CULTURE, BLOOD (ROUTINE X 2)  LACTIC ACID, PLASMA  LACTIC ACID, PLASMA  PROTIME-INR  C-REACTIVE PROTEIN  BASIC METABOLIC PANEL WITH GFR  CBC  TYPE AND SCREEN     EKG     RADIOLOGY  MR R foot:   IMPRESSION:  1. Status post interval amputation of the first ray to the level of  the medial cuneiform. There is a  large postsurgical soft tissue  wound/defect extending along the medial dorsal midfoot through  forefoot. Heterogenous fluid signal with foci of susceptibility  artifact within the soft tissues overlying the distal cortex of the  medial cuneiform measuring up to 2.9 x 2.0 cm, could reflect  organizing phlegmonous change/abscess formation and foci of soft  tissue air, which may be postsurgical, however, gas-forming  infection can not be excluded. There is underlying periostitis and  marrow edema of the medial cuneiform, osteomyelitis can not be  excluded.  2. This wound extends deep and laterally just superficial to medial  margin of the distal second metatarsal. There is T2/STIR  hyperintense marrow signal abnormality with corresponding confluent  T1 hypointensity of the second metatarsal, second proximal phalanx,  and the base of second middle phalanx, concerning for osteomyelitis.  3. Marrow edema with subtle corresponding T1 hypointensity of the  base of third proximal phalanx and mild marrow edema of the head of  third metatarsal, could reflect reactive marrow changes, however,  osteomyelitis can not be excluded.  4. Diffuse edema of the intrinsic musculature of the foot,  concerning for myositis.    PROCEDURES:  Critical Care performed: No  Procedures   MEDICATIONS ORDERED IN ED: Medications  ondansetron  (ZOFRAN ) injection 4 mg (has no administration in time range)  hydrALAZINE (APRESOLINE) injection 5 mg (has no administration in time range)  acetaminophen  (TYLENOL ) tablet 650 mg (650 mg Oral Given 04/17/24 2238)  insulin  aspart (novoLOG ) injection 0-9 Units (has no administration in time range)  insulin  aspart (novoLOG ) injection 0-5 Units ( Subcutaneous Not Given 04/17/24 2230)  linezolid  (ZYVOX ) IVPB 600 mg (has no administration in time range)  piperacillin -tazobactam (ZOSYN ) IVPB 3.375 g (has no administration in time range)  heparin injection 5,000 Units (5,000 Units  Subcutaneous Given 04/17/24 2312)  oxyCODONE -acetaminophen  (PERCOCET/ROXICET) 5-325 MG per tablet 1 tablet (has no administration in time range)  morphine  (PF) 2 MG/ML injection 2 mg (has no administration in time range)  atorvastatin  (LIPITOR) tablet 20 mg (has no administration in time range)  Insulin  NPH (Human) (Isophane) (HUMULIN  N) Kwikpen 16 Units (has no administration in time range)  gabapentin  (NEURONTIN ) capsule 300 mg (has no administration in time range)  piperacillin -tazobactam (ZOSYN ) IVPB 3.375 g (0 g Intravenous Stopped 04/17/24  1652)  linezolid  (ZYVOX ) IVPB 600 mg (0 mg Intravenous Stopped 04/17/24 1805)  oxyCODONE -acetaminophen  (PERCOCET/ROXICET) 5-325 MG per tablet 1 tablet (1 tablet Oral Given 04/17/24 1651)     IMPRESSION / MDM / ASSESSMENT AND PLAN / ED COURSE  I reviewed the triage vital signs and the nursing notes.  58 year old male with PMH as noted above presents sent in from infectious disease for a worsening right foot wound status post amputation to the right great toe for a gangrenous infection last month.    Differential diagnosis includes, but is not limited to, wound dehiscence, cellulitis, osteomyelitis, gangrene.  Dr. Francee Inch from ID recommends obtaining MRI, giving IV antibiotics, and consultation for debridement.  We will obtain labs, MRI, give antibiotics, analgesia, and reassess.  Patient's presentation is most consistent with acute presentation with potential threat to life or bodily function.  The patient is on the cardiac monitor to evaluate for evidence of arrhythmia and/or significant heart rate changes.   ----------------------------------------- 10:15 PM on 04/17/2024 -----------------------------------------  Lab workup is unremarkable.  His CBC shows no leukocytosis.  Lactate is normal.  MRI shows extensive findings including phlegmon, possible osteomyelitis, and the presence of some gas.  I consulted Dr. Althea Atkinson from podiatry who agrees to  evaluate the patient tomorrow for further surgery and debridement.  I consulted Dr. Rosalea Collin from the hospitalist service; based on our discussion he agrees to evaluate the patient for admission.   FINAL CLINICAL IMPRESSION(S) / ED DIAGNOSES   Final diagnoses:  Wound infection after surgery     Rx / DC Orders   ED Discharge Orders     None        Note:  This document was prepared using Dragon voice recognition software and may include unintentional dictation errors.    Lind Repine, MD 04/17/24 [phone]

## 2024-04-18 ENCOUNTER — Other Ambulatory Visit: Payer: Self-pay

## 2024-04-18 ENCOUNTER — Encounter
Admission: EM | Disposition: A | Discharge status: Home Health | Payer: Self-pay | Source: Home / Self Care | Attending: Student

## 2024-04-18 ENCOUNTER — Inpatient Hospital Stay: Admitting: Anesthesiology

## 2024-04-18 DIAGNOSIS — T8130XA Disruption of wound, unspecified, initial encounter: Secondary | ICD-10-CM

## 2024-04-18 DIAGNOSIS — B9562 Methicillin resistant Staphylococcus aureus infection as the cause of diseases classified elsewhere: Secondary | ICD-10-CM | POA: Diagnosis not present

## 2024-04-18 DIAGNOSIS — Z89411 Acquired absence of right great toe: Secondary | ICD-10-CM

## 2024-04-18 DIAGNOSIS — E11628 Type 2 diabetes mellitus with other skin complications: Secondary | ICD-10-CM

## 2024-04-18 DIAGNOSIS — L089 Local infection of the skin and subcutaneous tissue, unspecified: Secondary | ICD-10-CM

## 2024-04-18 DIAGNOSIS — L02611 Cutaneous abscess of right foot: Secondary | ICD-10-CM

## 2024-04-18 DIAGNOSIS — A499 Bacterial infection, unspecified: Secondary | ICD-10-CM

## 2024-04-18 DIAGNOSIS — M86171 Other acute osteomyelitis, right ankle and foot: Secondary | ICD-10-CM

## 2024-04-18 DIAGNOSIS — L97519 Non-pressure chronic ulcer of other part of right foot with unspecified severity: Secondary | ICD-10-CM

## 2024-04-18 DIAGNOSIS — E1121 Type 2 diabetes mellitus with diabetic nephropathy: Secondary | ICD-10-CM

## 2024-04-18 DIAGNOSIS — I96 Gangrene, not elsewhere classified: Secondary | ICD-10-CM | POA: Diagnosis not present

## 2024-04-18 HISTORY — PX: IRRIGATION AND DEBRIDEMENT FOOT: SHX6602

## 2024-04-18 HISTORY — DX: Bacterial infection, unspecified: A49.9

## 2024-04-18 HISTORY — PX: BONE BIOPSY: SHX375

## 2024-04-18 LAB — GLUCOSE, CAPILLARY
Glucose-Capillary: 163 mg/dL — ABNORMAL HIGH (ref 70–99)
Glucose-Capillary: 93 mg/dL (ref 70–99)
Glucose-Capillary: 75 mg/dL (ref 70–99)
Glucose-Capillary: 104 mg/dL — ABNORMAL HIGH (ref 70–99)
Glucose-Capillary: 89 mg/dL (ref 70–99)
Glucose-Capillary: 310 mg/dL — ABNORMAL HIGH (ref 70–99)

## 2024-04-18 LAB — BASIC METABOLIC PANEL WITH GFR
Anion gap: 9 (ref 5–15)
BUN: 15 mg/dL (ref 6–20)
CO2: 26 mmol/L (ref 22–32)
Calcium: 8.1 mg/dL — ABNORMAL LOW (ref 8.9–10.3)
Chloride: 100 mmol/L (ref 98–111)
Creatinine, Ser: 1.35 mg/dL — ABNORMAL HIGH (ref 0.61–1.24)
GFR, Estimated: >60 mL/min (ref >60)
Glucose, Bld: 140 mg/dL — ABNORMAL HIGH (ref 70–99)
Potassium: 4.2 mmol/L (ref 3.5–5.1)
Sodium: 135 mmol/L (ref 135–145)

## 2024-04-18 LAB — CBC
HCT: 26.9 % — ABNORMAL LOW (ref 39.0–52.0)
Hemoglobin: 8.6 g/dL — ABNORMAL LOW (ref 13.0–17.0)
MCH: 25.6 pg — ABNORMAL LOW (ref 26.0–34.0)
MCHC: 32 g/dL (ref 30.0–36.0)
MCV: 80.1 fL (ref 80.0–100.0)
Platelets: 344 10*3/uL (ref 150–400)
RBC: 3.36 MIL/uL — ABNORMAL LOW (ref 4.22–5.81)
RDW: 14.1 % (ref 11.5–15.5)
WBC: 8.4 10*3/uL (ref 4.0–10.5)
nRBC: 0 % (ref 0.0–0.2)

## 2024-04-18 LAB — IRON AND TIBC
Iron: 15 ug/dL — ABNORMAL LOW (ref 45–182)
Saturation Ratios: 9 % — ABNORMAL LOW (ref 17.9–39.5)
TIBC: 161 ug/dL — ABNORMAL LOW (ref 250–450)
UIBC: 146 ug/dL

## 2024-04-18 LAB — TYPE AND SCREEN

## 2024-04-18 LAB — FOLATE: Folate: 8.9 ng/mL (ref >5.9)

## 2024-04-18 LAB — VITAMIN D 25 HYDROXY (VIT D DEFICIENCY, FRACTURES): Vit D, 25-Hydroxy: 17.64 ng/mL — ABNORMAL LOW (ref 30–100)

## 2024-04-18 LAB — C-REACTIVE PROTEIN: CRP: 12.8 mg/dL — ABNORMAL HIGH (ref <1.0)

## 2024-04-18 LAB — VITAMIN B12: Vitamin B-12: 287 pg/mL (ref 180–914)

## 2024-04-18 SURGERY — IRRIGATION AND DEBRIDEMENT FOOT
Anesthesia: General | Site: Foot | Laterality: Right

## 2024-04-18 MED ORDER — FENTANYL CITRATE (PF) 100 MCG/2ML IJ SOLN
INTRAMUSCULAR | Status: DC | PRN
Start: 2024-04-18 — End: 2024-04-18
  Administered 2024-04-18: 25 ug via INTRAVENOUS

## 2024-04-18 MED ORDER — GABAPENTIN 300 MG PO CAPS
300.0000 mg | ORAL_CAPSULE | Freq: Two times a day (BID) | ORAL | Status: DC
  Administered 2024-04-18 – 2024-04-29 (×23): 300 mg via ORAL
  Filled 2024-04-18 (×23): qty 1

## 2024-04-18 MED ORDER — ONDANSETRON HCL 4 MG/2ML IJ SOLN
INTRAMUSCULAR | Status: DC | PRN
  Administered 2024-04-18: 4 mg via INTRAVENOUS

## 2024-04-18 MED ORDER — PROPOFOL 10 MG/ML IV BOLUS
INTRAVENOUS | Status: DC | PRN
  Administered 2024-04-18: 40 mg via INTRAVENOUS
  Administered 2024-04-18: 150 ug/kg/min via INTRAVENOUS

## 2024-04-18 MED ORDER — BUPIVACAINE HCL (PF) 0.5 % IJ SOLN
INTRAMUSCULAR | Status: AC
  Filled 2024-04-18: qty 30

## 2024-04-18 MED ORDER — ACETAMINOPHEN 10 MG/ML IV SOLN
INTRAVENOUS | Status: DC | PRN
  Administered 2024-04-18: 1000 mg via INTRAVENOUS

## 2024-04-18 MED ORDER — DEXAMETHASONE SODIUM PHOSPHATE 10 MG/ML IJ SOLN
INTRAMUSCULAR | Status: DC | PRN
  Administered 2024-04-18: 10 mg via INTRAVENOUS

## 2024-04-18 MED ORDER — OXYCODONE HCL 5 MG PO TABS: 5.0000 mg | ORAL_TABLET | Freq: Once | ORAL | Status: DC | PRN

## 2024-04-18 MED ORDER — BUPIVACAINE HCL 0.5 % IJ SOLN
INTRAMUSCULAR | Status: DC | PRN
  Administered 2024-04-18: 10 mL

## 2024-04-18 MED ORDER — SODIUM CHLORIDE 0.9 % IV SOLN: INTRAVENOUS | Status: DC | PRN

## 2024-04-18 MED ORDER — ACETAMINOPHEN 10 MG/ML IV SOLN
INTRAVENOUS | Status: AC
  Filled 2024-04-18: qty 100

## 2024-04-18 MED ORDER — LIDOCAINE HCL (CARDIAC) PF 100 MG/5ML IV SOSY
PREFILLED_SYRINGE | INTRAVENOUS | Status: DC | PRN
  Administered 2024-04-18: 100 mg via INTRAVENOUS

## 2024-04-18 MED ORDER — VANCOMYCIN HCL 1000 MG IV SOLR
INTRAVENOUS | Status: AC
  Filled 2024-04-18: qty 20

## 2024-04-18 MED ORDER — VITAMIN D (ERGOCALCIFEROL) 1.25 MG (50000 UNIT) PO CAPS
50000.0000 [IU] | ORAL_CAPSULE | ORAL | Status: DC
  Administered 2024-04-19 – 2024-04-26 (×2): 50000 [IU] via ORAL
  Filled 2024-04-18 (×3): qty 1

## 2024-04-18 MED ORDER — VANCOMYCIN HCL 1 G IV SOLR
INTRAVENOUS | Status: DC | PRN
  Administered 2024-04-18: 1000 mg

## 2024-04-18 MED ORDER — FENTANYL CITRATE (PF) 100 MCG/2ML IJ SOLN
25.0000 ug | INTRAMUSCULAR | Status: DC | PRN
Start: 2024-04-18 — End: 2024-04-18

## 2024-04-18 MED ORDER — CHLORHEXIDINE GLUCONATE 4 % EX SOLN
60.0000 mL | Freq: Once | CUTANEOUS | Status: DC
  Administered 2024-04-18: 4 via TOPICAL

## 2024-04-18 MED ORDER — VITAMIN B-12 1000 MCG PO TABS
1000.0000 ug | ORAL_TABLET | Freq: Every day | ORAL | Status: DC
  Administered 2024-04-25 – 2024-04-29 (×5): 1000 ug via ORAL
  Filled 2024-04-18 (×5): qty 1

## 2024-04-18 MED ORDER — MIDAZOLAM HCL 2 MG/2ML IJ SOLN
INTRAMUSCULAR | Status: AC
  Filled 2024-04-18: qty 2

## 2024-04-18 MED ORDER — OXYCODONE HCL 5 MG/5ML PO SOLN: 5.0000 mg | Freq: Once | ORAL | Status: DC | PRN

## 2024-04-18 MED ORDER — CYANOCOBALAMIN 1000 MCG/ML IJ SOLN
1000.0000 ug | Freq: Every day | INTRAMUSCULAR | Status: AC
  Administered 2024-04-18 – 2024-04-24 (×6): 1000 ug via INTRAMUSCULAR
  Filled 2024-04-18 (×7): qty 1

## 2024-04-18 MED ORDER — POVIDONE-IODINE 10 % EX SWAB
2.0000 | Freq: Once | CUTANEOUS | Status: AC
  Administered 2024-04-18: 2 via TOPICAL

## 2024-04-18 MED ORDER — VITAMIN C 500 MG PO TABS
500.0000 mg | ORAL_TABLET | Freq: Every day | ORAL | Status: DC
  Administered 2024-04-18 – 2024-04-29 (×12): 500 mg via ORAL
  Filled 2024-04-18 (×13): qty 1

## 2024-04-18 MED ORDER — 0.9 % SODIUM CHLORIDE (POUR BTL) OPTIME
TOPICAL | Status: DC | PRN
  Administered 2024-04-18: 500 mL

## 2024-04-18 MED ORDER — OXYCODONE-ACETAMINOPHEN 5-325 MG PO TABS
1.0000 | ORAL_TABLET | Freq: Four times a day (QID) | ORAL | Status: DC | PRN
  Administered 2024-04-18 – 2024-04-24 (×17): 2 via ORAL
  Administered 2024-04-24: 1 via ORAL
  Administered 2024-04-24 – 2024-04-29 (×13): 2 via ORAL
  Filled 2024-04-18 (×31): qty 2

## 2024-04-18 MED ORDER — FENTANYL CITRATE (PF) 100 MCG/2ML IJ SOLN
INTRAMUSCULAR | Status: AC
  Filled 2024-04-18: qty 2

## 2024-04-18 MED ORDER — MIDAZOLAM HCL 2 MG/2ML IJ SOLN
INTRAMUSCULAR | Status: DC | PRN
  Administered 2024-04-18: 2 mg via INTRAVENOUS

## 2024-04-18 SURGICAL SUPPLY — 58 items
BLADE OSC/SAGITTAL MD 5.5X18 (BLADE) IMPLANT
BLADE SURG 15 STRL LF DISP TIS (BLADE) ×2 IMPLANT
BLADE SW THK.38XMED LNG THN (BLADE) IMPLANT
BNDG COHESIVE 4X5 TAN STRL LF (GAUZE/BANDAGES/DRESSINGS) ×2 IMPLANT
BNDG COHESIVE 6X5 TAN ST LF (GAUZE/BANDAGES/DRESSINGS) ×2 IMPLANT
BNDG ELASTIC 4INX 5YD STR LF (GAUZE/BANDAGES/DRESSINGS) ×2 IMPLANT
BNDG ESMARCH 4X12 STRL LF (GAUZE/BANDAGES/DRESSINGS) ×2 IMPLANT
BNDG GAUZE DERMACEA FLUFF 4 (GAUZE/BANDAGES/DRESSINGS) ×2 IMPLANT
BNDG STRETCH GAUZE 3IN X12FT (GAUZE/BANDAGES/DRESSINGS) ×2 IMPLANT
CANISTER WOUND CARE 500ML ATS (WOUND CARE) ×2 IMPLANT
CNTNR URN SCR LID CUP LEK RST (MISCELLANEOUS) IMPLANT
CUFF TOURN SGL QUICK 12 (TOURNIQUET CUFF) IMPLANT
CUFF TOURN SGL QUICK 18X4 (TOURNIQUET CUFF) IMPLANT
DRAPE FLUOR MINI C-ARM 54X84 (DRAPES) IMPLANT
DRAPE XRAY CASSETTE 23X24 (DRAPES) IMPLANT
DRSG MEPILEX FLEX 3X3 (GAUZE/BANDAGES/DRESSINGS) IMPLANT
DURAPREP 26ML APPLICATOR (WOUND CARE) ×2 IMPLANT
ELECTRODE REM PT RTRN 9FT ADLT (ELECTROSURGICAL) ×2 IMPLANT
GAUZE 4X4 16PLY ~~LOC~~+RFID DBL (SPONGE) IMPLANT
GAUZE PACKING 0.25INX5YD STRL (GAUZE/BANDAGES/DRESSINGS) ×2 IMPLANT
GAUZE PAD ABD 8X10 STRL (GAUZE/BANDAGES/DRESSINGS) IMPLANT
GAUZE SPONGE 4X4 12PLY STRL (GAUZE/BANDAGES/DRESSINGS) ×2 IMPLANT
GAUZE STRETCH 2X75IN STRL (MISCELLANEOUS) ×2 IMPLANT
GAUZE XEROFORM 1X8 LF (GAUZE/BANDAGES/DRESSINGS) ×2 IMPLANT
GLOVE BIO SURGEON STRL SZ7.5 (GLOVE) ×2 IMPLANT
GLOVE INDICATOR 8.0 STRL GRN (GLOVE) ×2 IMPLANT
GOWN STRL REUS W/ TWL LRG LVL3 (GOWN DISPOSABLE) ×2 IMPLANT
GOWN STRL REUS W/TWL MED LVL3 (GOWN DISPOSABLE) ×2 IMPLANT
GOWN STRL REUS W/TWL XL LVL4 (GOWN DISPOSABLE) ×2 IMPLANT
HANDPIECE VERSAJET DEBRIDEMENT (MISCELLANEOUS) IMPLANT
IV NS 1000ML BAXH (IV SOLUTION) ×2 IMPLANT
KIT DRSG VAC SLVR GRANUFM (MISCELLANEOUS) ×2 IMPLANT
KIT STIMULAN RAPID CURE 5CC (Orthopedic Implant) IMPLANT
KIT TURNOVER KIT A (KITS) ×2 IMPLANT
LABEL OR SOLS (LABEL) ×2 IMPLANT
MANIFOLD NEPTUNE II (INSTRUMENTS) ×2 IMPLANT
NDL FILTER BLUNT 18X1 1/2 (NEEDLE) ×2 IMPLANT
NDL HYPO 25X1 1.5 SAFETY (NEEDLE) ×2 IMPLANT
NEEDLE FILTER BLUNT 18X1 1/2 (NEEDLE) ×2 IMPLANT
NEEDLE HYPO 25X1 1.5 SAFETY (NEEDLE) ×2 IMPLANT
NS IRRIG 500ML POUR BTL (IV SOLUTION) ×2 IMPLANT
PACK EXTREMITY ARMC (MISCELLANEOUS) ×2 IMPLANT
PACKING GAUZE IODOFORM 1INX5YD (GAUZE/BANDAGES/DRESSINGS) ×2 IMPLANT
PAD ABD DERMACEA PRESS 5X9 (GAUZE/BANDAGES/DRESSINGS) ×2 IMPLANT
PENCIL SMOKE EVACUATOR (MISCELLANEOUS) ×2 IMPLANT
RASP SM TEAR CROSS CUT (RASP) IMPLANT
SHIELD FULL FACE ANTIFOG 7M (MISCELLANEOUS) ×2 IMPLANT
SOL .9 NS 3000ML IRR UROMATIC (IV SOLUTION) ×2 IMPLANT
STOCKINETTE IMPERVIOUS 9X36 MD (GAUZE/BANDAGES/DRESSINGS) ×2 IMPLANT
SUT ETHILON 2 0 FS 18 (SUTURE) ×4 IMPLANT
SUT VIC AB 3-0 SH 27X BRD (SUTURE) ×2 IMPLANT
SUTURE ETHLN 4-0 FS2 18XMF BLK (SUTURE) ×2 IMPLANT
SWAB CULTURE AMIES ANAERIB BLU (MISCELLANEOUS) IMPLANT
SYR 10ML LL (SYRINGE) ×2 IMPLANT
SYR 3ML LL SCALE MARK (SYRINGE) ×2 IMPLANT
TIP FAN IRRIG PULSAVAC PLUS (DISPOSABLE) ×2 IMPLANT
TRAP FLUID SMOKE EVACUATOR (MISCELLANEOUS) ×2 IMPLANT
WATER STERILE IRR 500ML POUR (IV SOLUTION) ×2 IMPLANT

## 2024-04-18 NOTE — Plan of Care (Signed)
  Problem: Coping: Goal: Ability to adjust to condition or change in health will improve Outcome: Progressing   Problem: Fluid Volume: Goal: Ability to maintain a balanced intake and output will improve Outcome: Progressing   Problem: Education: Goal: Knowledge of General Education information will improve Description: Including pain rating scale, medication(s)/side effects and non-pharmacologic comfort measures Outcome: Progressing   

## 2024-04-18 NOTE — Consult Note (Addendum)
 NAME: [name]  DOB: Aug 28, 1966  MRN: [id number]  Date/Time: 04/18/2024 12:05 PM  REQUESTING PROVIDER: Dr.Kumar Subjective:  REASON FOR CONSULT: diabetic foot infection ? [name] is a 58 y.o. with a history of poorly controlled diabetes mellitus, peripheral neuropathy with chronic left foot wounds since 2023 which has been decreasing in size, recent right great toe and right foot gangrenous infection status post amputation of the great toe on 03/26/2024 in [place] and no surgical culture sent.Has been having complications with that foot with dehiscence of the surgical site and infection I had seen the patient on 04/03/2024 in my clinic and an culture which was Pseudomonas and he was placed on ciprofloxacin .  Back the foot is progressively worse with foul-smelling discharge and hence he was j admitted to [facility] yesterday He does not have any fever or chills Vitals in the ED BP of 131/76 temperature of 98.5 pulse was 98 and sats was 98%. WBC was 10.2, Hb 9, platelet 383 and creatinine 1.38. MRI of the right foot showed large postsurgical soft tissue wound defect along the medial dorsal midfoot through the forefoot with organizing phlegmonous change/abscess formation.  And questioning underlying osteomyelitis.  Blood cultures were sent Patient was started on Zosyn  and linezolid  He was seen by podiatrist and is being taken for surgery today I am seeing the patient for antibiotic management   Past Medical History:  Diagnosis Date   Diabetes mellitus without complication (HCC)    Tuberculosis    tested positive,treated with medications.    Past Surgical History:  Procedure Laterality Date   AMPUTATION TOE Right 03/26/2024   Procedure: AMPUTATION, TOE;  Surgeon: Timothy Ford, MD;  Location: [facility] OR;  Service: Orthopedics;  Laterality: Right;  RIGHT GREAT TOE AMPUTATION   FOOT SURGERY Left    I & D EXTREMITY Left 05/05/2022   Procedure: LEFT FOOT DEBRIDEMENT;   Surgeon: Timothy Ford, MD;  Location: [facility] OR;  Service: Orthopedics;  Laterality: Left;   MYRINGOTOMY WITH TUBE PLACEMENT Bilateral 07/20/2022   Procedure: MYRINGOTOMY WITH TUBE PLACEMENT;  Surgeon: Mellody Sprout, MD;  Location: [facility];  Service: ENT;  Laterality: Bilateral;  Diabetic   TONSILLECTOMY      Social History   Socioeconomic History   Marital status: Single    Spouse name: Not on file   Number of children: Not on file   Years of education: Not on file   Highest education level: Not on file  Occupational History   Not on file  Tobacco Use   Smoking status: Former    Current packs/day: 0.00    Average packs/day: 0.3 packs/day for 25.0 years (6.3 ttl pk-yrs)    Types: Cigarettes    Start date: 09/17/1997    Quit date: 09/17/2022    Years since quitting: 1.5   Smokeless tobacco: Never   Tobacco comments:    "Quit"  Vaping Use   Vaping status: Never Used  Substance and Sexual Activity   Alcohol use: No   Drug use: No   Sexual activity: Yes  Other Topics Concern   Not on file  Social History Narrative   Not on file   Social Drivers of Health   Financial Resource Strain: Patient Declined (06/28/2023)   Received from [facility] [employer] [employer] Strain (CARDIA)    Difficulty of Paying Living Expenses: Patient declined  Food Insecurity: No Food Insecurity (04/18/2024)   Hunger Vital Sign    Worried  About Running Out of Food in the Last Year: Never true    Ran Out of Food in the Last Year: Never true  Transportation Needs: No Transportation Needs (04/18/2024)   PRAPARE - Administrator, Civil Service (Medical): No    Lack of Transportation (Non-Medical): No  Physical Activity: Not on file  Stress: Not on file  Social Connections: Unknown (05/24/2022)   Received from [facility]   Social Network    Social Network: Not on file  Intimate Partner Violence: Not At Risk (04/18/2024)   Humiliation,  Afraid, Rape, and Kick questionnaire    Fear of Current or Ex-Partner: No    Emotionally Abused: No    Physically Abused: No    Sexually Abused: No    Family History  Problem Relation Age of Onset   Colon cancer Neg Hx    Colon polyps Neg Hx    Crohn's disease Neg Hx    Esophageal cancer Neg Hx    Rectal cancer Neg Hx    Stomach cancer Neg Hx    Ulcerative colitis Neg Hx    Allergies  Allergen Reactions   Metformin  And Related Diarrhea   I? Current Facility-Administered Medications  Medication Dose Route Frequency Provider Last Rate Last Admin   acetaminophen  (TYLENOL ) tablet 650 mg  650 mg Oral Q6H PRN Niu, Xilin, MD   650 mg at 04/18/24 0856   atorvastatin  (LIPITOR) tablet 20 mg  20 mg Oral Daily Niu, Xilin, MD   20 mg at 04/18/24 0900   gabapentin  (NEURONTIN ) capsule 300 mg  300 mg Oral BID Althia Atlas, MD   300 mg at 04/18/24 0932   heparin injection 5,000 Units  5,000 Units Subcutaneous Q8H Niu, Xilin, MD   5,000 Units at 04/18/24 0533   hydrALAZINE (APRESOLINE) injection 5 mg  5 mg Intravenous Q2H PRN Niu, Xilin, MD       insulin  aspart (novoLOG ) injection 0-5 Units  0-5 Units Subcutaneous QHS Niu, Xilin, MD       insulin  aspart (novoLOG ) injection 0-9 Units  0-9 Units Subcutaneous TID WC Niu, Xilin, MD   2 Units at 04/18/24 0900   insulin  NPH Human (NOVOLIN N) injection 16 Units  16 Units Subcutaneous BID AC & HS Niu, Xilin, MD   16 Units at 04/18/24 0900   linezolid  (ZYVOX ) IVPB 600 mg  600 mg Intravenous Q12H Niu, Xilin, MD 300 mL/hr at 04/18/24 0656 600 mg at 04/18/24 0656   morphine  (PF) 2 MG/ML injection 2 mg  2 mg Intravenous Q4H PRN Niu, Xilin, MD   2 mg at 04/18/24 1055   ondansetron  (ZOFRAN ) injection 4 mg  4 mg Intravenous Q8H PRN Niu, Xilin, MD       oxyCODONE -acetaminophen  (PERCOCET/ROXICET) 5-325 MG per tablet 1 tablet  1 tablet Oral Q4H PRN Niu, Xilin, MD   1 tablet at 04/18/24 0859   piperacillin -tazobactam (ZOSYN ) IVPB 3.375 g  3.375 g Intravenous Q8H  Merrill, Kristin A, RPH         Abtx:  Anti-infectives (From admission, onward)    Start     Dose/Rate Route Frequency Ordered Stop   04/18/24 2200  piperacillin -tazobactam (ZOSYN ) IVPB 3.375 g        3.375 g 12.5 mL/hr over 240 Minutes Intravenous Every 8 hours 04/17/24 2208     04/18/24 0700  linezolid  (ZYVOX ) IVPB 600 mg        600 mg 300 mL/hr over 60 Minutes Intravenous Every 12 hours  04/17/24 2200     04/17/24 1315  piperacillin -tazobactam (ZOSYN ) IVPB 3.375 g        3.375 g 100 mL/hr over 30 Minutes Intravenous  Once 04/17/24 1309 04/17/24 1652   04/17/24 1315  linezolid  (ZYVOX ) IVPB 600 mg        600 mg 300 mL/hr over 60 Minutes Intravenous  Once 04/17/24 1309 04/17/24 1805       REVIEW OF SYSTEMS:  Const: negative fever, negative chills, negative weight loss Eyes: negative diplopia or visual changes, negative eye pain ENT: negative coryza, negative sore throat Resp: negative cough, hemoptysis, dyspnea Cards: negative for chest pain, palpitations, lower extremity edema GU: negative for frequency, dysuria and hematuria GI: Negative for abdominal pain, diarrhea, bleeding, constipation Skin: negative for rash and pruritus Heme: negative for easy bruising and gum/nose bleeding MS: negative for myalgias, arthralgias, back pain and muscle weakness Neurolo:negative for headaches, dizziness, vertigo, memory problems has peripheral neuropathy Psych: negative for feelings of anxiety, depression  Endocrine:diabetes Allergy/Immunology- negative for any medication or food allergies ?  Objective:  VITALS:  BP 138/78 (BP Location: Right Arm)   Pulse 97   Temp (!) 100.4 F (38 C) (Oral)   Resp 20   Ht 5\' 9"  (1.753 m)   Wt 99.8 kg   SpO2 97%   BMI 32.49 kg/m   PHYSICAL EXAM:  General: Alert, cooperative, no distress, appears stated age.  Head: Normocephalic, without obvious abnormality, atraumatic. Eyes: Conjunctivae clear, anicteric sclerae. Pupils are equal ENT Nares  normal. No drainage or sinus tenderness. Lips, mucosa, and tongue normal. No Thrush Neck: Supple, symmetrical, no adenopathy, thyroid: non tender no carotid bruit and no JVD. Back: No CVA tenderness. Lungs: Clear to auscultation bilaterally. No Wheezing or Rhonchi. No rales. Heart: Regular rate and rhythm, no murmur, rub or gallop. Abdomen: Soft, non-tender,not distended. Bowel sounds normal. No masses Extremities: Right foot great toe amputation site has dehiscence with nonviable tissue and purulent discharge     Left foot has 2 wounds.  They have clean base and does not seem to be infected callus around the wounds      05/09/22  skin: No rashes or lesions. Or bruising Lymph: Cervical, supraclavicular normal. Neurologic: Peripheral neuropathy Pertinent Labs Lab Results CBC    Component Value Date/Time   WBC 8.4 04/18/2024 0537   RBC 3.36 (L) 04/18/2024 0537   HGB 8.6 (L) 04/18/2024 0537   HCT 26.9 (L) 04/18/2024 0537   PLT 344 04/18/2024 0537   MCV 80.1 04/18/2024 0537   MCH 25.6 (L) 04/18/2024 0537   MCHC 32.0 04/18/2024 0537   RDW 14.1 04/18/2024 0537   LYMPHSABS 1.4 04/17/2024 1302   MONOABS 0.8 04/17/2024 1302   EOSABS 0.2 04/17/2024 1302   BASOSABS 0.0 04/17/2024 1302       Latest Ref Rng & Units 04/18/2024    5:37 AM 04/17/2024    1:02 PM 04/03/2024    1:27 PM  CMP  Glucose 70 - 99 mg/dL 161  096  045   BUN 6 - 20 mg/dL 15  13  15    Creatinine 0.61 - 1.24 mg/dL 4.09  8.11  9.14   Sodium 135 - 145 mmol/L 135  133  134   Potassium 3.5 - 5.1 mmol/L 4.2  4.5  4.0   Chloride 98 - 111 mmol/L 100  98  102   CO2 22 - 32 mmol/L 26  26  26    Calcium  8.9 - 10.3 mg/dL 8.1  8.4  8.1   Total Protein 6.5 - 8.1 g/dL  7.6  7.7   Total Bilirubin 0.0 - 1.2 mg/dL  0.6  0.3   Alkaline Phos 38 - 126 U/L  55  50   AST 15 - 41 U/L  12  13   ALT 0 - 44 U/L  13  8       Microbiology: Recent Results (from the past 240 hours)  Culture, blood (Routine x 2)     Status: None  (Preliminary result)   Collection Time: 04/17/24  1:02 PM   Specimen: BLOOD  Result Value Ref Range Status   Specimen Description BLOOD LEFT ANTECUBITAL  Final   Special Requests   Final    BOTTLES DRAWN AEROBIC AND ANAEROBIC Blood Culture adequate volume   Culture   Final    NO GROWTH < 24 HOURS Performed at [facility], [address], [place], [place] [address]    Report Status PENDING  Incomplete  Culture, blood (Routine x 2)     Status: None (Preliminary result)   Collection Time: 04/17/24  3:40 PM   Specimen: Left Antecubital; Blood  Result Value Ref Range Status   Specimen Description LEFT ANTECUBITAL  Final   Special Requests   Final    BOTTLES DRAWN AEROBIC AND ANAEROBIC Blood Culture results may not be optimal due to an inadequate volume of blood received in culture bottles   Culture   Final    NO GROWTH < 12 HOURS Performed at [facility], [address], [place], [place] [address]    Report Status PENDING  Incomplete   HbA1c on 03/26/2024 was 13.1   IMAGING RESULTS: MRI of the right foot reviewed large postsurgical soft tissue wound defect along the medial dorsal midfoot through the forefoot with organizing phlegmonous change/abscess formation.  And questioning underlying osteomyelitis.  I have personally reviewed the films ? Impression/Recommendation Poorly controlled diabetes with diabetic neuropathy with diabetic foot infection Right great toe amputation secondary to necrotizing gangrenous infection of the toe on 03/26/2024 Now has complete dehiscence of the surgical wound with nonviable tissue, purulent drainage and abscess at the surgical site with light clean underlying osteomyelitis. Patient is currently on linezolid  and Zosyn  He will be taken to the OR for debridement and washout and deep cultures will be sent. Pt likely will end up with TMA  Chronic left foot trophic ulcers Had undergone extensive debridement in 2023 when BKA was  suggested but he had refused The wounds have come a long way  Diabetes mellitus with a hemoglobin A1c of 13.1.  Currently on insulin   CKD  Anemia ________________________________________________ Discussed with patient, podiatrist and hospitalist. Note:  This document was prepared using Dragon voice recognition software and may include unintentional dictation errors.

## 2024-04-18 NOTE — Op Note (Signed)
 Operative note   Surgeon:Drayk Humbarger Armed forces logistics/support/administrative officer: None    Preop diagnosis:1.  Postoperative wound dehiscence right foot 2.  Possible osteomyelitis right second metatarsophalangeal joint    Postop diagnosis: Same    Procedure: 1.  Incision and drainage postoperative wound dehiscence right foot 2.  Biopsy of bone second metatarsal right foot 3.  Placement of antibiotic impregnated beads    EBL: Minimal    Anesthesia:local and IV sedation.  Local consists of a total of 10 cc of 1% lidocaine  plain    Hemostasis: None    Specimen: 1.  Culture swab Deep wound culture 2.  Sec metatarsal bone for culture 3.  Second metatarsal bone for pathology    Complications: None    Operative indications:[name] is an 58 y.o. that presents today for surgical intervention.  The risks/benefits/alternatives/complications have been discussed and consent has been given.    Procedure:  Patient was brought into the OR and placed on the operating table in thesupine position. After anesthesia was obtained theright lower extremity was prepped and draped in usual sterile fashion.  Attention directed to the right first metatarsal region where previous surgery was noted.  Complete open wound dehiscence with purulent drainage and necrotic tissue was noted.  This time the wound was then debrided and all necrotic viable tissue was removed with a Versajet.  Further I&D was performed into the proximal aspect of the cuneiform region.  A deep wound culture was performed after sterile flush.  This time further debridement along the deep tissue medial aspect of the second metatarsal was performed.  The sec metatarsophalangeal joint was noted to be completely exposed once all necrotic tissue was removed.  There was a tunnel/tract under the second metatarsal that was probed.  No purulence was noted in this area.  This time the bone biopsy with a Jamshidi needle was used.  2 separate passes were performed.  1 sample was sent for  culture and the second sample sent for pathology.  The sec metatarsophalangeal joint was grossly exposed at this time as well.  The metatarsal head itself had somewhat of a slight gray appearance.  At this time further evaluation was performed.  Placement of antibiotic impregnated vancomycin  beads was was performed.  The wound was then covered with a bulky sterile dressing.  Of note the removal of the necrotic nonviable tissue left a large defect.  Unable to perform any attempt at primary closure at this time.    Patient tolerated the procedure and anesthesia well.  Was transported from the OR to the PACU with all vital signs stable and vascular status intact. To be discharged per routine protocol.  Will follow up in approximately 1 week in the outpatient clinic.

## 2024-04-18 NOTE — Anesthesia Postprocedure Evaluation (Signed)
 Anesthesia Post Note  Patient: Danney Hanney  Procedure(s) Performed: IRRIGATION AND DEBRIDEMENT FOOT, PLACEMENT OF ANTIBIOTIC BEADS (Right: Foot) BIOPSY, BONE (2ND METATARSAL)  Patient location during evaluation: PACU Anesthesia Type: General Level of consciousness: awake and alert Pain management: pain level controlled Vital Signs Assessment: post-procedure vital signs reviewed and stable Respiratory status: spontaneous breathing, nonlabored ventilation, respiratory function stable and patient connected to nasal cannula oxygen  Cardiovascular status: blood pressure returned to baseline and stable Postop Assessment: no apparent nausea or vomiting Anesthetic complications: no   No notable events documented.   Last Vitals:  Vitals:   04/18/24 1700 04/18/24 1720  BP: 132/76 123/82  Pulse: 88 86  Resp: 18 20  Temp: (!) 36.3 C 36.8 C  SpO2: 96% 99%    Last Pain:  Vitals:   04/18/24 1700  TempSrc:   PainSc: 0-No pain                 Portia Brittle Tiffony Kite

## 2024-04-18 NOTE — Consult Note (Signed)
 ORTHOPAEDIC CONSULTATION  REQUESTING PHYSICIAN: Althia Atlas, MD  Chief Complaint: Right foot infection  HPI: [name] is a 58 y.o. male who complains of worsening wound to his right foot.  Has a longstanding history of complications with this right foot.  Underwent first ray amputation in [place] about 2 weeks ago.  Developed progressive dehiscence of the wound.  Presented to infectious disease and was sent to the hospital.  Noted worsening infection to the right foot.  He has a longstanding history of diabetes uncontrolled.  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  He has chronic left foot ulcerations as well.  Past Medical History:  Diagnosis Date   Diabetes mellitus without complication (HCC)    Tuberculosis    tested positive,treated with medications.   Past Surgical History:  Procedure Laterality Date   AMPUTATION TOE Right 03/26/2024   Procedure: AMPUTATION, TOE;  Surgeon: Timothy Ford, MD;  Location: [facility] OR;  Service: Orthopedics;  Laterality: Right;  RIGHT GREAT TOE AMPUTATION   FOOT SURGERY Left    I & D EXTREMITY Left 05/05/2022   Procedure: LEFT FOOT DEBRIDEMENT;  Surgeon: Timothy Ford, MD;  Location: [facility] OR;  Service: Orthopedics;  Laterality: Left;   MYRINGOTOMY WITH TUBE PLACEMENT Bilateral 07/20/2022   Procedure: MYRINGOTOMY WITH TUBE PLACEMENT;  Surgeon: Mellody Sprout, MD;  Location: [facility];  Service: ENT;  Laterality: Bilateral;  Diabetic   TONSILLECTOMY     Social History   Socioeconomic History   Marital status: Single    Spouse name: Not on file   Number of children: Not on file   Years of education: Not on file   Highest education level: Not on file  Occupational History   Not on file  Tobacco Use   Smoking status: Former    Current packs/day: 0.00    Average packs/day: 0.3 packs/day for 25.0 years (6.3 ttl pk-yrs)    Types: Cigarettes    Start date: 09/17/1997    Quit date: 09/17/2022    Years since quitting: 1.5   Smokeless tobacco:  Never   Tobacco comments:    "Quit"  Vaping Use   Vaping status: Never Used  Substance and Sexual Activity   Alcohol use: No   Drug use: No   Sexual activity: Yes  Other Topics Concern   Not on file  Social History Narrative   Not on file   Social Drivers of Health   Financial Resource Strain: Patient Declined (06/28/2023)   Received from [facility] [employer] Strain (CARDIA)    Difficulty of Paying Living Expenses: Patient declined  Food Insecurity: No Food Insecurity (04/18/2024)   Hunger Vital Sign    Worried About Running Out of Food in the Last Year: Never true    Ran Out of Food in the Last Year: Never true  Transportation Needs: No Transportation Needs (04/18/2024)   PRAPARE - Administrator, Civil Service (Medical): No    Lack of Transportation (Non-Medical): No  Physical Activity: Not on file  Stress: Not on file  Social Connections: Unknown (05/24/2022)   Received from [facility]   Social Network    Social Network: Not on file   Family History  Problem Relation Age of Onset   Colon cancer Neg Hx    Colon polyps Neg Hx    Crohn's disease Neg Hx    Esophageal cancer Neg Hx    Rectal cancer Neg Hx    Stomach cancer  Neg Hx    Ulcerative colitis Neg Hx    Allergies  Allergen Reactions   Metformin  And Related Diarrhea   Prior to Admission medications   Medication Sig Start Date End Date Taking? Authorizing Provider  Accu-Chek Softclix Lancets lancets Use to check blood sugar 3 times daily. 04/01/24   Newlin, Enobong, MD  amLODipine  (NORVASC ) 5 MG tablet Take 1 tablet (5 mg total) by mouth daily. 12/12/22   Newlin, Enobong, MD  atorvastatin  (LIPITOR) 20 MG tablet Take 1 tablet (20 mg total) by mouth daily. 12/12/22   Newlin, Enobong, MD  Blood Glucose Monitoring Suppl (ACCU-CHEK GUIDE) w/Device KIT Use to check blood sugar 3 times daily. 04/01/24   Newlin, Enobong, MD  ciprofloxacin  (CIPRO ) 500 MG tablet  Take 1 tablet (500 mg total) by mouth 2 (two) times daily. 04/04/24   Alica Inks, MD  Continuous Blood Gluc Transmit (DEXCOM G6 TRANSMITTER) MISC Use to check blood sugar three times daily. Change transmitter once every 909 days. E11.69 06/05/22   Newlin, Enobong, MD  gabapentin  (NEURONTIN ) 300 MG capsule Take 1 capsule (300 mg total) by mouth at bedtime. 09/11/22   Newlin, Enobong, MD  glucose blood (ACCU-CHEK GUIDE TEST) test strip Use to check blood sugar 3 times daily. 04/01/24   Newlin, Enobong, MD  Insulin  NPH, Human,, Isophane, (HUMULIN  N KWIKPEN) 100 UNIT/ML Kiwkpen Inject 22 Units into the skin 2 (two) times daily. Patient taking differently: Inject 22 Units into the skin 2 (two) times daily. Patient reports dose was 28 units prior to discharge 12/12/22   Newlin, Enobong, MD  Insulin  Pen Needle 32G X 4 MM MISC Use as directed to inject insulin  up to 4 times daily. 11/21/22   Newlin, Enobong, MD  metFORMIN  (GLUCOPHAGE -XR) 500 MG 24 hr tablet Take 2 tablets (1,000 mg total) by mouth 2 (two) times daily with a meal. 03/28/24   Samtani, Jai-Gurmukh, MD  methocarbamol  (ROBAXIN ) 750 MG tablet Take 1 tablet (750 mg total) by mouth every 6 (six) hours as needed for muscle spasms. 03/28/24   Samtani, Jai-Gurmukh, MD  nitroGLYCERIN  (NITRODUR - DOSED IN MG/24 HR) 0.2 mg/hr patch Place 1 patch (0.2 mg total) onto the skin daily. 04/01/24   Timothy Ford, MD  oxyCODONE -acetaminophen  (PERCOCET/ROXICET) 5-325 MG tablet Take 1 tablet by mouth every 4 (four) hours as needed. 04/16/24   Timothy Ford, MD   MR FOOT RIGHT WO CONTRAST Result Date: 04/17/2024 CLINICAL DATA:  Concern for osteomyelitis. Recent amputation of the right great toe on 03/26/2024. EXAM: MRI OF THE RIGHT FOREFOOT WITHOUT CONTRAST TECHNIQUE: Multiplanar, multisequence MR imaging of the right foot was performed. No intravenous contrast was administered. COMPARISON:  Right foot radiographs dated 04/17/2024. MRI of the right foot dated  03/25/2024. FINDINGS: Soft tissue/ Bones/Joint/Cartilage Status post interval amputation of the first ray to the level of the medial cuneiform. There is a large postsurgical soft tissue wound/defect extending along the medial dorsal midfoot through forefoot. Heterogenous fluid signal with foci of susceptibility artifact within the soft tissues overlying the distal cortex of the medial cuneiform measuring up to 2.9 x 2.0 cm, could reflect organizing phlegmonous change/abscess formation and foci of soft tissue air, which may be postsurgical, however, gas-forming infection can not be entirely excluded. There is underlying periostitis and marrow edema of the medial cuneiform, which may be reactive, however, osteomyelitis can not be excluded. This wound extends deep and laterally just superficial to medial margin of the distal second metatarsal. There is T2/STIR hyperintense marrow signal  abnormality with corresponding confluent T1 hypointensity of the second metatarsal, second proximal phalanx, and the base of second middle phalanx, which could reflect reactive marrow changes, however, osteomyelitis can not be excluded. Marrow edema with subtle corresponding T1 hypointensity of the base of third proximal phalanx and mild marrow edema of the head of third metatarsal, could reflect reactive marrow changes, however, osteomyelitis can not be excluded. No significant joint effusion. Mild-to-moderate degenerative changes of second through fifth TMT joints. Ligaments Lisfranc ligament appears intact. Muscles and Tendons Diffuse edema of the intrinsic musculature of the foot, myositis can not be excluded. No significant tenosynovitis. IMPRESSION: 1. Status post interval amputation of the first ray to the level of the medial cuneiform. There is a large postsurgical soft tissue wound/defect extending along the medial dorsal midfoot through forefoot. Heterogenous fluid signal with foci of susceptibility artifact within the soft  tissues overlying the distal cortex of the medial cuneiform measuring up to 2.9 x 2.0 cm, could reflect organizing phlegmonous change/abscess formation and foci of soft tissue air, which may be postsurgical, however, gas-forming infection can not be excluded. There is underlying periostitis and marrow edema of the medial cuneiform, osteomyelitis can not be excluded. 2. This wound extends deep and laterally just superficial to medial margin of the distal second metatarsal. There is T2/STIR hyperintense marrow signal abnormality with corresponding confluent T1 hypointensity of the second metatarsal, second proximal phalanx, and the base of second middle phalanx, concerning for osteomyelitis. 3. Marrow edema with subtle corresponding T1 hypointensity of the base of third proximal phalanx and mild marrow edema of the head of third metatarsal, could reflect reactive marrow changes, however, osteomyelitis can not be excluded. 4. Diffuse edema of the intrinsic musculature of the foot, concerning for myositis. Electronically Signed   By: Mannie Seek M.D.   On: 04/17/2024 21:05   DG Foot Complete Right Result Date: 04/17/2024 CLINICAL DATA:  foot swelling, wound dehiscence. EXAM: RIGHT FOOT COMPLETE - 3+ VIEW COMPARISON:  03/25/2024. FINDINGS: Since the prior study, patient underwent amputation of great toe, at the level of tarsometatarsal joint region. There is air within the soft tissue in the surgical bed. No focal bone erosions. No acute fracture or dislocation. No aggressive osseous lesion. No significant arthritis of imaged joints. Calcaneal spur noted along the Achilles tendon and Plantar aponeurosis attachment sites. No focal soft tissue swelling. No radiopaque foreign bodies. IMPRESSION: *Interval amputation of great toe, at the level of tarsometatarsal joint region. There is air within the soft tissue in the surgical bed. No focal bone erosions. Electronically Signed   By: Beula Brunswick M.D.   On:  04/17/2024 13:28    Positive ROS: All other systems have been reviewed and were otherwise negative with the exception of those mentioned in the HPI and as above.  12 point ROS was performed.  Physical Exam: General: Alert and oriented.  No apparent distress.  Vascular:  Left foot:Not evaluated this a.m.  Right foot: Palpable DP and PT pulses  Neuro: Loss of protective sensation and gross sensation  Derm: Large full-thickness ulcer with wound dehiscence from previous first ray amputation.  Marked purulent drainage is noted.  Fibrotic necrotic tissue around the second MTPJ is demonstrated as well.  Ortho/MS: Diffuse edema to the right foot.  Exposed deep tissue with purulent drainage and edema to the foot and ankle.  I have reviewed the MRI which is consistent with infectious process along the amputation site.  Concerning for possible osteomyelitis of the second metatarsophalangeal joint  region.  Questionable osteomyelitis versus reactive change of the third toe.  Assessment: Acute abscess and postoperative infection right first ray amputation site Diabetes with neuropathy Chronic ulceration  Plan: Patient will need at minimum I&D to start with at this point.  He has obvious purulent drainage.  May need to perform excision of second metatarsal phalangeal joint and toe based on intraoperative findings.  I discussed very likely he will need eventual transmetatarsal amputation which will likely need to be staged at this point.  At this point just want to get the infection under control and diminished before doing a more definitive procedure.  I discussed the surgery with the patient in detail.  The risk benefits alternatives and complications were discussed with the patient in full.  He has been n.p.o. since midnight tonight.  Will place orders at this time.  Will plan on surgery later this afternoon.    Brant Caldron, DPM Cell [phone]   04/18/2024 8:56 AM \

## 2024-04-18 NOTE — Transfer of Care (Signed)
 Immediate Anesthesia Transfer of Care Note  Patient: [name]  Procedure(s) Performed: IRRIGATION AND DEBRIDEMENT FOOT (Right)  Patient Location: PACU  Anesthesia Type:General  Level of Consciousness: awake, alert , and oriented  Airway & Oxygen  Therapy: Patient Spontanous Breathing and Patient connected to face mask oxygen   Post-op Assessment: Report given to RN and Post -op Vital signs reviewed and stable  Post vital signs: Reviewed and stable  Last Vitals:  Vitals Value Taken Time  BP 109/71 04/18/24 1637  Temp 36.4 C 04/18/24 1634  Pulse 90 04/18/24 1637  Resp 14 04/18/24 1637  SpO2 100 % 04/18/24 1637  Vitals shown include unfiled device data.  Last Pain:  Vitals:   04/18/24 1443  TempSrc: Oral  PainSc: 10-Worst pain ever         Complications: No notable events documented.

## 2024-04-18 NOTE — Anesthesia Preprocedure Evaluation (Signed)
 Anesthesia Evaluation  Patient identified by MRN, date of birth, ID band Patient awake    Reviewed: Allergy & Precautions, NPO status , Patient's Chart, lab work & pertinent test results  History of Anesthesia Complications Negative for: history of anesthetic complications  Airway Mallampati: III  TM Distance: >3 FB Neck ROM: full    Dental  (+) Chipped   Pulmonary neg shortness of breath, former smoker   Pulmonary exam normal        Cardiovascular Exercise Tolerance: Good hypertension, (-) angina Normal cardiovascular exam     Neuro/Psych negative neurological ROS  negative psych ROS   GI/Hepatic negative GI ROS, Neg liver ROS,,,  Endo/Other  diabetes, Type 2    Renal/GU Renal disease  negative genitourinary   Musculoskeletal   Abdominal   Peds  Hematology negative hematology ROS (+)   Anesthesia Other Findings Past Medical History: No date: Diabetes mellitus without complication (HCC) No date: Tuberculosis     Comment:  tested positive,treated with medications.  Past Surgical History: 03/26/2024: AMPUTATION TOE; Right     Comment:  Procedure: AMPUTATION, TOE;  Surgeon: Timothy Ford,               MD;  Location: [facility] OR;  Service: Orthopedics;  Laterality:              Right;  RIGHT GREAT TOE AMPUTATION No date: FOOT SURGERY; Left 05/05/2022: I & D EXTREMITY; Left     Comment:  Procedure: LEFT FOOT DEBRIDEMENT;  Surgeon: Timothy Ford, MD;  Location: [facility] OR;  Service: Orthopedics;                Laterality: Left; 07/20/2022: MYRINGOTOMY WITH TUBE PLACEMENT; Bilateral     Comment:  Procedure: MYRINGOTOMY WITH TUBE PLACEMENT;  Surgeon:               Juengel, Paul, MD;  Location: [facility];                Service: ENT;  Laterality: Bilateral;  Diabetic No date: TONSILLECTOMY  BMI    Body Mass Index: 32.49 kg/m      Reproductive/Obstetrics negative OB ROS                              Anesthesia Physical Anesthesia Plan  ASA: 3  Anesthesia Plan: General   Post-op Pain Management:    Induction: Intravenous  PONV Risk Score and Plan: Propofol  infusion and TIVA  Airway Management Planned: Natural Airway and Nasal Cannula  Additional Equipment:   Intra-op Plan:   Post-operative Plan:   Informed Consent: I have reviewed the patients History and Physical, chart, labs and discussed the procedure including the risks, benefits and alternatives for the proposed anesthesia with the patient or authorized representative who has indicated his/her understanding and acceptance.     Dental Advisory Given  Plan Discussed with: Anesthesiologist, CRNA and Surgeon  Anesthesia Plan Comments: (Patient consented for risks of anesthesia including but not limited to:  - adverse reactions to medications - risk of airway placement if required - damage to eyes, teeth, lips or other oral mucosa - nerve damage due to positioning  - sore throat or hoarseness - Damage to heart, brain, nerves, lungs, other parts of body or loss of life  Patient voiced understanding and assent.)       Anesthesia  Quick Evaluation

## 2024-04-18 NOTE — Anesthesia Procedure Notes (Signed)
 Procedure Name: General with mask airway Date/Time: 04/18/2024 3:36 PM  Performed by: Angelia Kelp, CRNAPre-anesthesia Checklist: Patient identified, Emergency Drugs available, Suction available, Patient being monitored and Timeout performed Patient Re-evaluated:Patient Re-evaluated prior to induction Oxygen  Delivery Method: Simple face mask Preoxygenation: Pre-oxygenation with 100% oxygen  Induction Type: IV induction

## 2024-04-18 NOTE — Progress Notes (Signed)
 [facility] Progress Note  Patient: [name]    ZOX:[id number]  DOA: 04/17/2024     Date of Service: the patient was seen and examined on 04/18/2024  Chief Complaint  Patient presents with   Foot Pain   Brief hospital course: Lajarvis Farleigh is a 58 y.o. male with medical history significant of hypertension, hyperlipidemia, diabetes mellitus, CKD-3A, former smoker, obesity, diabetic foot ulcer, who presents with right foot wound and right foot pain.   Patient was recently hospitalized from 4/22 - 4/25 due to diabetic foot infection with gangrenous change. Patient is s/p of right greater toe first ray amputation. Pt has been on Cipro  as cultures from 5/1 showed Pseudomonas, but his wound is getting worse, now has dehiscence of wound.  Patient has further pain which is constant, 7 out of 10 in severity, aching, nonradiating.  No fever or chills.  Patient does not have chest pain, cough, SOB.  No nausea, vomiting, diarrhea or abdominal pain.  No symptoms of UTI.    Patient was seen by Dr. Francee Inch of ID today. She recommended patient to go to ED for MRI, IV antibiotic, Wound bedridment and cultures.  Dr. Fawn Hooks tooks many pictures of both feet (please see the picture in Dr. Sherryle Don note).   Data reviewed independently and ED Course: pt was found to have WBC 10.2, GFR> 60, lactic acid 0.9 --> 0.8, temperature normal, blood pressure 134/84, heart rate 100, RR 18, oxygen  saturation 97% on room air.  Patient is admitted to MedSurg bed as inpatient.  Dr. Althea Atkinson of podiatry is consulted by EDP.  MRI reviewed: Abscess and gas formation, possible osteomyelitis. second metatarsal, second proximal phalanx, and the base of second middle phalanx, concerning for osteomyelitis. base of third proximal phalanx and mild marrow edema of the head of third metatarsal, could reflect reactive marrow changes, however, osteomyelitis can not be excluded. Diffuse edema of the intrinsic musculature of the  foot, concerning for myositis.   Assessment and Plan: Diabetic foot infection (HCC):  pt is s/p of right greater toe first ray amputation. His wound is getting worse, now has dehiscence of wound. MRI shows extensive findings including phlegmon, possible osteomyelitis, and the presence of some gas.  Patient does not have fever or leukocytosis.  Lactic acid is normal.  Clinically not septic.  - Empiric antimicrobial treatment with Zosyn  and linezolid  - PRN Zofran  for nausea, and Tylenol , morphine  and Percocet for pain - Blood cultures x 2 and wound culture - ESR and CRP - wound care consult - INR/PTT/type & screen - NPO after MN  Consulted Dr. Althea Atkinson of podiatry.  Patient is scheduled for washout today   # Hypertension associated with diabetes, patient is not taking amlodipine .  Blood pressure 134/84 -IV hydralazine as needed   # HLD (hyperlipidemia): -Lipitor   # Chronic kidney disease, stage 3a: Renal function stable.  Recent baseline creatinine 1.69 on 04/03/2024.  His creatinine is 1.38, BUN 13, GFR 60. - Follow-up with BMP   # Type II diabetes mellitus with renal manifestations [facility]): Recent A1c 5.8, well-controlled.  Patient taking metformin  and NPH insulin  22 units twice daily -Sliding scale insulin  - NPH insulin  16 units twice daily   # Obesity (BMI 30-39.9): Body weight 99.8 kg, BMI 32.49 -Encourage losing weight - Exercise and healthy diet     # Vitamin D deficiency: started vitamin D 50,000 units p.o. weekly, follow with PCP to repeat vitamin D level after 3 to 6 months.  # Vitamin B12 level  287, goal >400, started vitamin B12 1000 mcg IM injection during hospital stay followed by oral supplement.  # Iron deficiency anemia, transferrin saturation 9% Start oral iron supplement after resolution of infection Avoided IV iron due to active infection Follow-up with PCP to repeat iron profile after 3 to 6 months Folate level within normal range. Low vitamin B12 as  above   Body mass index is 32.49 kg/m.  Interventions:  Diet: NPO  DVT Prophylaxis: Subcutaneous Heparin    Advance goals of care discussion: Full code  Family Communication: family was present at bedside, at the time of interview.  The pt provided permission to discuss medical plan with the family. Opportunity was given to ask question and all questions were answered satisfactorily.   Disposition:  Pt is from Home, admitted with Right foot abscess, DM foot infection and possible osteomyelitis, still on IV abx, need I&D washout, which precludes a safe discharge. Discharge to Home, when stable and cleared by podiatry and ID.  Patient may need few days to improve.  Subjective: No significant events overnight, patient was having significant pain 9/10 which improved after morphine  IV injection to 3/10 Patient denied any other complaints, he is awaiting for surgical procedure  Physical Exam: General: NAD, lying comfortably Appear in no distress, affect appropriate Eyes: PERRLA ENT: Oral Mucosa Clear, moist  Neck: no JVD,  Cardiovascular: S1 and S2 Present, no Murmur,  Respiratory: good respiratory effort, Bilateral Air entry equal and Decreased, no Crackles, no wheezes Abdomen: Bowel Sound present, Soft and no tenderness,  Skin: no rashes Extremities: No edema, bilateral feet dressing CDI Neurologic: without any new focal findings Gait not checked due to patient safety concerns  Vitals:   04/17/24 2227 04/18/24 0458 04/18/24 0823 04/18/24 1443  BP: (!) 154/86 (!) 141/77 138/78 129/85  Pulse: 90 93 97 91  Resp: 20 20 20 18   Temp: 99.9 F (37.7 C) 99.5 F (37.5 C) (!) 100.4 F (38 C) 98.2 F (36.8 C)  TempSrc: Oral  Oral Oral  SpO2: 100% 99% 97% 98%  Weight:    99.8 kg  Height:    5\' 9"  (1.753 m)    Intake/Output Summary (Last 24 hours) at 04/18/2024 1540 Last data filed at 04/17/2024 2300 Gross per 24 hour  Intake 600 ml  Output --  Net 600 ml   Filed Weights    04/17/24 1255 04/18/24 1443  Weight: 99.8 kg 99.8 kg    Data Reviewed: I have personally reviewed and interpreted daily labs, tele strips, imagings as discussed above. I reviewed all nursing notes, pharmacy notes, vitals, pertinent old records I have discussed plan of care as described above with RN and patient/family.  CBC: Recent Labs  Lab 04/17/24 1302 04/18/24 0537  WBC 10.2 8.4  NEUTROABS 7.8*  --   HGB 9.0* 8.6*  HCT 29.2* 26.9*  MCV 82.0 80.1  PLT 383 344   Basic Metabolic Panel: Recent Labs  Lab 04/17/24 1302 04/18/24 0537  NA 133* 135  K 4.5 4.2  CL 98 100  CO2 26 26  GLUCOSE 130* 140*  BUN 13 15  CREATININE 1.38* 1.35*  CALCIUM  8.4* 8.1*    Studies: MR FOOT RIGHT WO CONTRAST Result Date: 04/17/2024 CLINICAL DATA:  Concern for osteomyelitis. Recent amputation of the right great toe on 03/26/2024. EXAM: MRI OF THE RIGHT FOREFOOT WITHOUT CONTRAST TECHNIQUE: Multiplanar, multisequence MR imaging of the right foot was performed. No intravenous contrast was administered. COMPARISON:  Right foot radiographs dated 04/17/2024. MRI  of the right foot dated 03/25/2024. FINDINGS: Soft tissue/ Bones/Joint/Cartilage Status post interval amputation of the first ray to the level of the medial cuneiform. There is a large postsurgical soft tissue wound/defect extending along the medial dorsal midfoot through forefoot. Heterogenous fluid signal with foci of susceptibility artifact within the soft tissues overlying the distal cortex of the medial cuneiform measuring up to 2.9 x 2.0 cm, could reflect organizing phlegmonous change/abscess formation and foci of soft tissue air, which may be postsurgical, however, gas-forming infection can not be entirely excluded. There is underlying periostitis and marrow edema of the medial cuneiform, which may be reactive, however, osteomyelitis can not be excluded. This wound extends deep and laterally just superficial to medial margin of the distal  second metatarsal. There is T2/STIR hyperintense marrow signal abnormality with corresponding confluent T1 hypointensity of the second metatarsal, second proximal phalanx, and the base of second middle phalanx, which could reflect reactive marrow changes, however, osteomyelitis can not be excluded. Marrow edema with subtle corresponding T1 hypointensity of the base of third proximal phalanx and mild marrow edema of the head of third metatarsal, could reflect reactive marrow changes, however, osteomyelitis can not be excluded. No significant joint effusion. Mild-to-moderate degenerative changes of second through fifth TMT joints. Ligaments Lisfranc ligament appears intact. Muscles and Tendons Diffuse edema of the intrinsic musculature of the foot, myositis can not be excluded. No significant tenosynovitis. IMPRESSION: 1. Status post interval amputation of the first ray to the level of the medial cuneiform. There is a large postsurgical soft tissue wound/defect extending along the medial dorsal midfoot through forefoot. Heterogenous fluid signal with foci of susceptibility artifact within the soft tissues overlying the distal cortex of the medial cuneiform measuring up to 2.9 x 2.0 cm, could reflect organizing phlegmonous change/abscess formation and foci of soft tissue air, which may be postsurgical, however, gas-forming infection can not be excluded. There is underlying periostitis and marrow edema of the medial cuneiform, osteomyelitis can not be excluded. 2. This wound extends deep and laterally just superficial to medial margin of the distal second metatarsal. There is T2/STIR hyperintense marrow signal abnormality with corresponding confluent T1 hypointensity of the second metatarsal, second proximal phalanx, and the base of second middle phalanx, concerning for osteomyelitis. 3. Marrow edema with subtle corresponding T1 hypointensity of the base of third proximal phalanx and mild marrow edema of the head of  third metatarsal, could reflect reactive marrow changes, however, osteomyelitis can not be excluded. 4. Diffuse edema of the intrinsic musculature of the foot, concerning for myositis. Electronically Signed   By: Mannie Seek M.D.   On: 04/17/2024 21:05    Scheduled Meds:  [MAR Hold] atorvastatin   20 mg Oral Daily   chlorhexidine   60 mL Topical Once   [MAR Hold] gabapentin   300 mg Oral BID   [MAR Hold] heparin  5,000 Units Subcutaneous Q8H   [MAR Hold] insulin  aspart  0-5 Units Subcutaneous QHS   [MAR Hold] insulin  aspart  0-9 Units Subcutaneous TID WC   [MAR Hold] insulin  NPH Human  16 Units Subcutaneous BID AC & HS   Continuous Infusions:  [MAR Hold] linezolid  (ZYVOX ) IV 600 mg (04/18/24 0656)   [MAR Hold] piperacillin -tazobactam (ZOSYN )  IV     PRN Meds: [MAR Hold] acetaminophen , [MAR Hold] hydrALAZINE, [MAR Hold]  morphine  injection, [MAR Hold] ondansetron  (ZOFRAN ) IV, [MAR Hold] oxyCODONE -acetaminophen   Time spent: 55 minutes  Author: Althia Atlas. MD [facility] 04/18/2024 3:40 PM  To reach On-call, see care teams to  locate the attending and reach out to them via [email or web address]. If 7PM-7AM, please contact night-coverage If you still have difficulty reaching the attending provider, please page the [facility] (Director on Call) for [facility] on amion for assistance.

## 2024-04-18 NOTE — Consult Note (Addendum)
 WOC Nurse Consult Note: patient with longstanding wounds plantar L foot (followed by Dr. Julio Ohm since 2023); had R great toe amputation 03/26/2024 by Dr. Julio Ohm; last seen in his office with wound dehiscence 5/8 and prescribed Vashe WTD dressings to foot Dr. Julio Ohm DID NOT prescribe Medihoney; patient ordered this himself on line  Reason for Consult: diabetic foot wounds  Wound type: 1. Full thickness L plantar diabetic foot ulcers 2.  R great toe post amputation with wound dehiscence  Pressure Injury POA: NA  Measurement: see nursing flowsheet; per Dr. Julio Ohm note 5/8 dehiscence 10 cm x 1 cm x 1 cm  Wound bed: L plantar foot wounds largely clean; R great toe wound dehiscence 50% red 50% tan fibrinous tissue  Drainage (amount, consistency, odor) foul smelling per MD note Periwound:heavy callusing noted around L plantar foot wounds; R great toe some yellow gray necrotic tissue  Dressing procedure/placement/frequency:  Cleanse L plantar foot wounds with Vashe wound cleanser [employer] [phone]) do not rinse and allow to air dry. Apply silver  hydrofiber (Aquacel Lorain Robson #[id number]) cut to fit wound beds daily.  Secure with dry gauze and Kerlix roll gauze or silicone foams whichever patient prefers.   Cleanse R great toe amputation site with Vashe wound cleanser [employer] Foot [phone]) do not rinse and allow to air dry.  Using a Q tip applicator apply Vashe moistened gauze to open wound bed.  Cover skin surrounding wound with Xeroform gauze [employer] Foot [phone]).  Cover entire area with ABD pad and secure with Kerlix roll guaze.    POC discussed with bedside nurse. Podiatry has been consulted on this patient. Any wound care orders placed by podiatry supercede wound care orders placed by [facility] RN.     WOC team will not follow. Re-consult if further needs arise.   Thank you,    Ronni Colace MSN, RN-BC, [employer] [phone]

## 2024-04-18 NOTE — Plan of Care (Signed)

## 2024-04-19 DIAGNOSIS — E11628 Type 2 diabetes mellitus with other skin complications: Secondary | ICD-10-CM | POA: Diagnosis not present

## 2024-04-19 DIAGNOSIS — L089 Local infection of the skin and subcutaneous tissue, unspecified: Secondary | ICD-10-CM | POA: Diagnosis not present

## 2024-04-19 LAB — CBC
HCT: 28.3 % — ABNORMAL LOW (ref 39.0–52.0)
Hemoglobin: 8.8 g/dL — ABNORMAL LOW (ref 13.0–17.0)
MCH: 25.3 pg — ABNORMAL LOW (ref 26.0–34.0)
MCHC: 31.1 g/dL (ref 30.0–36.0)
MCV: 81.3 fL (ref 80.0–100.0)
Platelets: 417 10*3/uL — ABNORMAL HIGH (ref 150–400)
RBC: 3.48 MIL/uL — ABNORMAL LOW (ref 4.22–5.81)
RDW: 14 % (ref 11.5–15.5)
WBC: 9.3 10*3/uL (ref 4.0–10.5)
nRBC: 0 % (ref 0.0–0.2)

## 2024-04-19 LAB — BASIC METABOLIC PANEL WITH GFR
Anion gap: 8 (ref 5–15)
BUN: 21 mg/dL — ABNORMAL HIGH (ref 6–20)
CO2: 24 mmol/L (ref 22–32)
Calcium: 8 mg/dL — ABNORMAL LOW (ref 8.9–10.3)
Chloride: 97 mmol/L — ABNORMAL LOW (ref 98–111)
Creatinine, Ser: 1.56 mg/dL — ABNORMAL HIGH (ref 0.61–1.24)
GFR, Estimated: 51 mL/min — ABNORMAL LOW (ref >60)
Glucose, Bld: 375 mg/dL — ABNORMAL HIGH (ref 70–99)
Potassium: 5.1 mmol/L (ref 3.5–5.1)
Sodium: 129 mmol/L — ABNORMAL LOW (ref 135–145)

## 2024-04-19 LAB — PHOSPHORUS: Phosphorus: 2.2 mg/dL — ABNORMAL LOW (ref 2.5–4.6)

## 2024-04-19 LAB — GLUCOSE, CAPILLARY
Glucose-Capillary: 324 mg/dL — ABNORMAL HIGH (ref 70–99)
Glucose-Capillary: 326 mg/dL — ABNORMAL HIGH (ref 70–99)
Glucose-Capillary: 335 mg/dL — ABNORMAL HIGH (ref 70–99)
Glucose-Capillary: 348 mg/dL — ABNORMAL HIGH (ref 70–99)

## 2024-04-19 LAB — MAGNESIUM: Magnesium: 1.9 mg/dL (ref 1.7–2.4)

## 2024-04-19 MED ORDER — POLYSACCHARIDE IRON COMPLEX 150 MG PO CAPS
150.0000 mg | ORAL_CAPSULE | Freq: Every day | ORAL | Status: DC
Start: 2024-04-19 — End: 2024-04-29
  Administered 2024-04-19 – 2024-04-29 (×11): 150 mg via ORAL
  Filled 2024-04-19 (×11): qty 1

## 2024-04-19 NOTE — Progress Notes (Signed)
 [facility] Progress Note  Patient: [name]    WUJ:[id number]  DOA: 04/17/2024     Date of Service: the patient was seen and examined on 04/19/2024  Chief Complaint  Patient presents with   Foot Pain   Brief hospital course: [name] is a 58 y.o. male with medical history significant of hypertension, hyperlipidemia, diabetes mellitus, CKD-3A, former smoker, obesity, diabetic foot ulcer, who presents with right foot wound and right foot pain.   Patient was recently hospitalized from 4/22 - 4/25 due to diabetic foot infection with gangrenous change. Patient is s/p of right greater toe first ray amputation. Pt has been on Cipro  as cultures from 5/1 showed Pseudomonas, but his wound is getting worse, now has dehiscence of wound.  Patient has further pain which is constant, 7 out of 10 in severity, aching, nonradiating.  No fever or chills.  Patient does not have chest pain, cough, SOB.  No nausea, vomiting, diarrhea or abdominal pain.  No symptoms of UTI.    Patient was seen by Dr. Francee Inch of ID today. She recommended patient to go to ED for MRI, IV antibiotic, Wound bedridment and cultures.  Dr. Fawn Hooks tooks many pictures of both feet (please see the picture in Dr. Sherryle Don note).   Data reviewed independently and ED Course: pt was found to have WBC 10.2, GFR> 60, lactic acid 0.9 --> 0.8, temperature normal, blood pressure 134/84, heart rate 100, RR 18, oxygen  saturation 97% on room air.  Patient is admitted to MedSurg bed as inpatient.  Dr. Althea Atkinson of podiatry is consulted by EDP.  MRI reviewed: Abscess and gas formation, possible osteomyelitis. second metatarsal, second proximal phalanx, and the base of second middle phalanx, concerning for osteomyelitis. base of third proximal phalanx and mild marrow edema of the head of third metatarsal, could reflect reactive marrow changes, however, osteomyelitis can not be excluded. Diffuse edema of the intrinsic musculature of the  foot, concerning for myositis.   Assessment and Plan: Diabetic foot infection [facility]):   s/p of right greater toe first ray amputation, wound is getting worse, now has dehiscence of wound. MRI reviewed as above  - continue  Zosyn  and linezolid  - PRN Zofran  for nausea, and Tylenol , morphine  and Percocet for pain - Blood cultures NGTD, ESR 109 and CRP 12.8 elevated  - wound care consulted Consulted Dr. Althea Atkinson of podiatry s/p I&D done on 5/16  Procedure: 1.  Incision and drainage postoperative wound dehiscence right foot 2.  Biopsy of bone second metatarsal right foot 3.  Placement of antibiotic impregnated beads  F/u ID for Abx duration  F/u wound culture from OR   # Hypertension associated with diabetes, patient is not taking amlodipine . -IV hydralazine  as needed   # HLD (hyperlipidemia): Lipitor   # Chronic kidney disease, stage 3a: Renal function stable.  Recent baseline creatinine 1.69 on 04/03/2024.  His creatinine is 1.38, BUN 13, GFR 60. - Follow-up with BMP   # Type II diabetes mellitus with renal manifestations [facility]): Recent A1c 5.8, well-controlled.  Patient taking metformin  and NPH insulin  22 units twice daily -Sliding scale insulin  - NPH insulin  16 units twice daily     # Vitamin D  deficiency: started vitamin D  50,000 units p.o. weekly, follow with PCP to repeat vitamin D  level after 3 to 6 months.  # Vitamin B12 level 287, goal >400, started vitamin B12 1000 mcg IM injection during hospital stay followed by oral supplement.  # Iron deficiency anemia, transferrin saturation 9% Started oral  iron supplement, Avoided IV iron due to active infection Follow-up with PCP to repeat iron profile after 3 to 6 months Folate level within normal range. Low vitamin B12 as above  Obesity class I Body mass index is 32.49 kg/m.  Interventions: Calorie restricted diet and daily exercise advised to lose body weight.  Lifestyle modification discussed.   Diet: Carb modified diet, low  potassium diet DVT Prophylaxis: Subcutaneous Heparin     Advance goals of care discussion: Full code  Family Communication: family was present at bedside, at the time of interview.  The pt provided permission to discuss medical plan with the family. Opportunity was given to ask question and all questions were answered satisfactorily.   Disposition:  Pt is from Home, admitted with Right foot abscess, DM foot infection and possible osteomyelitis, still on IV abx, need I&D washout, which precludes a safe discharge. Discharge to Home, when stable and cleared by podiatry and ID.  Patient may need few days to improve.  Subjective: No significant events overnight, currently pain in the foot is 7/10, denies any other complaints.  Patient did receive pain medication Potassium level slightly high, recommended no bananas and avocados.     Physical Exam: General: NAD, lying comfortably Appear in no distress, affect appropriate Eyes: PERRLA ENT: Oral Mucosa Clear, moist  Neck: no JVD,  Cardiovascular: S1 and S2 Present, no Murmur,  Respiratory: good respiratory effort, Bilateral Air entry equal and Decreased, no Crackles, no wheezes Abdomen: Bowel Sound present, Soft and no tenderness,  Skin: no rashes Extremities: No edema, s/p Rt foot washout, b/l feet dressing CDI Neurologic: without any new focal findings Gait not checked due to patient safety concerns  Vitals:   04/18/24 1720 04/18/24 2103 04/19/24 0315 04/19/24 0820  BP: 123/82 133/84 126/81 138/87  Pulse: 86 86 76 79  Resp: 20 18 18 18   Temp: 98.2 F (36.8 C) 98 F (36.7 C) 97.8 F (36.6 C) 97.9 F (36.6 C)  TempSrc:  Oral  Oral  SpO2: 99% 98% 98% 99%  Weight:      Height:        Intake/Output Summary (Last 24 hours) at 04/19/2024 4742 Last data filed at 04/19/2024 0300 Gross per 24 hour  Intake 1040.66 ml  Output 10 ml  Net 1030.66 ml   Filed Weights   04/17/24 1255 04/18/24 1443  Weight: 99.8 kg 99.8 kg    Data  Reviewed: I have personally reviewed and interpreted daily labs, tele strips, imagings as discussed above. I reviewed all nursing notes, pharmacy notes, vitals, pertinent old records I have discussed plan of care as described above with RN and patient/family.  CBC: Recent Labs  Lab 04/17/24 1302 04/18/24 0537 04/19/24 0324  WBC 10.2 8.4 9.3  NEUTROABS 7.8*  --   --   HGB 9.0* 8.6* 8.8*  HCT 29.2* 26.9* 28.3*  MCV 82.0 80.1 81.3  PLT 383 344 417*   Basic Metabolic Panel: Recent Labs  Lab 04/17/24 1302 04/18/24 0537 04/19/24 0324  NA 133* 135 129*  K 4.5 4.2 5.1  CL 98 100 97*  CO2 26 26 24   GLUCOSE 130* 140* 375*  BUN 13 15 21*  CREATININE 1.38* 1.35* 1.56*  CALCIUM  8.4* 8.1* 8.0*  MG  --   --  1.9  PHOS  --   --  2.2*    Studies: No results found.   Scheduled Meds:  vitamin C   500 mg Oral Daily   atorvastatin   20 mg Oral Daily  cyanocobalamin   1,000 mcg Intramuscular Q1200   Followed by   [name] ON 04/25/2024] vitamin B-12  1,000 mcg Oral Daily   gabapentin   300 mg Oral BID   heparin   5,000 Units Subcutaneous Q8H   insulin  aspart  0-5 Units Subcutaneous QHS   insulin  aspart  0-9 Units Subcutaneous TID WC   insulin  NPH Human  16 Units Subcutaneous BID AC & HS   Vitamin D  (Ergocalciferol )  50,000 Units Oral Q7 days   Continuous Infusions:  linezolid  (ZYVOX ) IV Stopped (04/18/24 2214)   piperacillin -tazobactam (ZOSYN )  IV 3.375 g (04/19/24 0511)   PRN Meds: acetaminophen , hydrALAZINE , morphine  injection, ondansetron  (ZOFRAN ) IV, oxyCODONE -acetaminophen   Time spent: 40 minutes  Author: Althia Atlas. MD [facility] 04/19/2024 9:09 AM  To reach On-call, see care teams to locate the attending and reach out to them via [email or web address]. If 7PM-7AM, please contact night-coverage If you still have difficulty reaching the attending provider, please page the [facility] (Director on Call) for [facility] on amion for assistance.

## 2024-04-19 NOTE — Progress Notes (Signed)
 Daily Progress Note   Subjective  - 1 Day Post-Op  Follow-up right foot debridement with bone biopsy.  Patient in good spirits today.  No complaints at this time.  Objective Vitals:   04/18/24 1720 04/18/24 2103 04/19/24 0315 04/19/24 0820  BP: 123/82 133/84 126/81 138/87  Pulse: 86 86 76 79  Resp: 20 18 18 18   Temp: 98.2 F (36.8 C) 98 F (36.7 C) 97.8 F (36.6 C) 97.9 F (36.6 C)  TempSrc:  Oral  Oral  SpO2: 99% 98% 98% 99%  Weight:      Height:        Physical Exam: Dressing removed at this time.  Large defect along the medial aspect of the previous first ray amputation site.  The second metatarsophalangeal joint is completely exposed at this time with the head of the metatarsal and base of the proximal phalanx exposed at this time.  No active purulent drainage.  Markedly improved.  Granular tissue noted today. Intraoperative culture showing GPC's and GNR's Laboratory CBC    Component Value Date/Time   WBC 9.3 04/19/2024 0324   HGB 8.8 (L) 04/19/2024 0324   HCT 28.3 (L) 04/19/2024 0324   PLT 417 (H) 04/19/2024 0324    BMET    Component Value Date/Time   NA 129 (L) 04/19/2024 0324   K 5.1 04/19/2024 0324   CL 97 (L) 04/19/2024 0324   CO2 24 04/19/2024 0324   GLUCOSE 375 (H) 04/19/2024 0324   BUN 21 (H) 04/19/2024 0324   CREATININE 1.56 (H) 04/19/2024 0324   CREATININE 1.37 (H) 06/20/2022 1047   CALCIUM  8.0 (L) 04/19/2024 0324   GFRNONAA 51 (L) 04/19/2024 0324   GFRAA >60 12/29/2019 1005    Assessment/Planning: Wound dehiscence with acute infection 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  Marked improvement to the wound bed at this time.  The second metatarsophalangeal joint is completely exposed at this point.  All necrotic tissue was debrided down to that level. Waiting culture results at this time.  2 cultures were taken intraoperatively.  1 was bone and 1 was a swab of the tissue.  Currently one of the  intraoperative cultures is showing gram-positive cocci and gram-negative rods.  I could not tell from the micro lab which was bone in which was the swab performed. Given the size of the defect I suspect he will not have enough tissue to consider closure of this wound.  We are awaiting the culture and pathology  results from the second metatarsal.  If this shows osteomyelitis I would recommend transmetatarsal amputation at that time.  Given the large defect transmetatarsal amputation is also recommended to consider primary closure of the wound.  Patient seems very resistant to going ahead at this time.  Wants to await results from pathology and cultures. New dressing applied today.  Will follow-up tomorrow.  If culture and pathology is negative can give consideration to wound VAC placement. Maintain nonweightbearing to the right foot. I also discussed the chronic ulceration on his left foot.  He described having a very severe wound that has dramatically improved.  There is still a large ulceration with will have to continue to monitor as he defers any aggressive treatment on the left side.  Anell Baptist A  04/19/2024, 10:39 AM

## 2024-04-19 NOTE — Plan of Care (Signed)
  Problem: Education: Goal: Ability to describe self-care measures that may prevent or decrease complications (Diabetes Survival Skills Education) will improve Outcome: Progressing Goal: Individualized Educational Video(s) Outcome: Progressing   Problem: Activity: Goal: Risk for activity intolerance will decrease Outcome: Progressing   Problem: Pain Managment: Goal: General experience of comfort will improve and/or be controlled Outcome: Progressing   Problem: Safety: Goal: Ability to remain free from injury will improve Outcome: Progressing

## 2024-04-19 NOTE — Inpatient Diabetes Management (Signed)
 Inpatient Diabetes Program Recommendations  AACE/ADA: New Consensus Statement on Inpatient Glycemic Control (2015)  Target Ranges:  Prepandial:   less than 140 mg/dL      Peak postprandial:   less than 180 mg/dL (1-2 hours)      Critically ill patients:  140 - 180 mg/dL   Lab Results  Component Value Date   GLUCAP 326 (H) 04/19/2024   HGBA1C 13.1 (H) 03/26/2024    Review of Glycemic Control  Latest Reference Range & Units 04/18/24 21:11 04/19/24 08:18 04/19/24 12:22  Glucose-Capillary 70 - 99 mg/dL 161 (H) 096 (H) 045 (H)  (H): Data is abnormally high  Diabetes history: DM2 Outpatient Diabetes medications: NPH 22 units BID, Dexcom G6 Current orders for Inpatient glycemic control: NPH 16 units BID, Novolog  0-9 units TID and 0-5 units QHS  Inpatient Diabetes Program Recommendations:    Received Decadron  10 mg on 5/16.  Glucose trends > 300 mg/dL today.  Might consider:  Novolog  0-20 units TID and HS NPH 22 units BID-Home dose  Will continue to follow while inpatient.  Thank you, Hays Lipschutz, MSN, CDCES Diabetes Coordinator Inpatient Diabetes Program [phone] (team pager from 8a-5p)

## 2024-04-20 DIAGNOSIS — B9562 Methicillin resistant Staphylococcus aureus infection as the cause of diseases classified elsewhere: Secondary | ICD-10-CM | POA: Diagnosis not present

## 2024-04-20 DIAGNOSIS — T8130XA Disruption of wound, unspecified, initial encounter: Secondary | ICD-10-CM | POA: Diagnosis not present

## 2024-04-20 DIAGNOSIS — E11628 Type 2 diabetes mellitus with other skin complications: Secondary | ICD-10-CM | POA: Diagnosis not present

## 2024-04-20 DIAGNOSIS — I96 Gangrene, not elsewhere classified: Secondary | ICD-10-CM | POA: Diagnosis not present

## 2024-04-20 DIAGNOSIS — L089 Local infection of the skin and subcutaneous tissue, unspecified: Secondary | ICD-10-CM | POA: Diagnosis not present

## 2024-04-20 LAB — BASIC METABOLIC PANEL WITH GFR
Anion gap: 7 (ref 5–15)
BUN: 18 mg/dL (ref 6–20)
CO2: 27 mmol/L (ref 22–32)
Calcium: 8.1 mg/dL — ABNORMAL LOW (ref 8.9–10.3)
Chloride: 99 mmol/L (ref 98–111)
Creatinine, Ser: 1.38 mg/dL — ABNORMAL HIGH (ref 0.61–1.24)
GFR, Estimated: 60 mL/min — ABNORMAL LOW (ref >60)
Glucose, Bld: 344 mg/dL — ABNORMAL HIGH (ref 70–99)
Potassium: 4.5 mmol/L (ref 3.5–5.1)
Sodium: 133 mmol/L — ABNORMAL LOW (ref 135–145)

## 2024-04-20 LAB — CBC
HCT: 27.8 % — ABNORMAL LOW (ref 39.0–52.0)
Hemoglobin: 8.6 g/dL — ABNORMAL LOW (ref 13.0–17.0)
MCH: 25.1 pg — ABNORMAL LOW (ref 26.0–34.0)
MCHC: 30.9 g/dL (ref 30.0–36.0)
MCV: 81.3 fL (ref 80.0–100.0)
Platelets: 442 10*3/uL — ABNORMAL HIGH (ref 150–400)
RBC: 3.42 MIL/uL — ABNORMAL LOW (ref 4.22–5.81)
RDW: 13.8 % (ref 11.5–15.5)
WBC: 12.2 10*3/uL — ABNORMAL HIGH (ref 4.0–10.5)
nRBC: 0 % (ref 0.0–0.2)

## 2024-04-20 LAB — PHOSPHORUS: Phosphorus: 2.5 mg/dL (ref 2.5–4.6)

## 2024-04-20 LAB — GLUCOSE, CAPILLARY
Glucose-Capillary: 292 mg/dL — ABNORMAL HIGH (ref 70–99)
Glucose-Capillary: 296 mg/dL — ABNORMAL HIGH (ref 70–99)
Glucose-Capillary: 258 mg/dL — ABNORMAL HIGH (ref 70–99)
Glucose-Capillary: 304 mg/dL — ABNORMAL HIGH (ref 70–99)

## 2024-04-20 LAB — MAGNESIUM: Magnesium: 2.1 mg/dL (ref 1.7–2.4)

## 2024-04-20 MED ORDER — INSULIN ASPART 100 UNIT/ML IJ SOLN
0.0000 [IU] | Freq: Every day | INTRAMUSCULAR | Status: DC
  Administered 2024-04-20: 4 [IU] via SUBCUTANEOUS
  Administered 2024-04-21: 3 [IU] via SUBCUTANEOUS
  Administered 2024-04-22 – 2024-04-26 (×4): 2 [IU] via SUBCUTANEOUS
  Administered 2024-04-27: 3 [IU] via SUBCUTANEOUS
  Administered 2024-04-28: 2 [IU] via SUBCUTANEOUS
  Filled 2024-04-20 (×8): qty 1

## 2024-04-20 MED ORDER — INSULIN NPH (HUMAN) (ISOPHANE) 100 UNIT/ML ~~LOC~~ SUSP
20.0000 [IU] | Freq: Two times a day (BID) | SUBCUTANEOUS | Status: DC
  Filled 2024-04-20: qty 10

## 2024-04-20 MED ORDER — INSULIN ASPART 100 UNIT/ML IJ SOLN
0.0000 [IU] | Freq: Three times a day (TID) | INTRAMUSCULAR | Status: DC
  Administered 2024-04-20 (×2): 8 [IU] via SUBCUTANEOUS
  Administered 2024-04-21 (×2): 2 [IU] via SUBCUTANEOUS
  Administered 2024-04-21 – 2024-04-22 (×2): 3 [IU] via SUBCUTANEOUS
  Administered 2024-04-22: 2 [IU] via SUBCUTANEOUS
  Administered 2024-04-23 – 2024-04-24 (×2): 8 [IU] via SUBCUTANEOUS
  Administered 2024-04-24 (×2): 3 [IU] via SUBCUTANEOUS
  Administered 2024-04-25: 2 [IU] via SUBCUTANEOUS
  Administered 2024-04-25 – 2024-04-26 (×2): 5 [IU] via SUBCUTANEOUS
  Administered 2024-04-26: 2 [IU] via SUBCUTANEOUS
  Administered 2024-04-26: 3 [IU] via SUBCUTANEOUS
  Administered 2024-04-27 (×2): 5 [IU] via SUBCUTANEOUS
  Administered 2024-04-27: 3 [IU] via SUBCUTANEOUS
  Administered 2024-04-28: 5 [IU] via SUBCUTANEOUS
  Administered 2024-04-28: 3 [IU] via SUBCUTANEOUS
  Administered 2024-04-28 – 2024-04-29 (×3): 5 [IU] via SUBCUTANEOUS
  Filled 2024-04-20 (×24): qty 1

## 2024-04-20 MED ORDER — INSULIN NPH (HUMAN) (ISOPHANE) 100 UNIT/ML ~~LOC~~ SUSP
22.0000 [IU] | Freq: Two times a day (BID) | SUBCUTANEOUS | Status: DC
  Administered 2024-04-20 – 2024-04-22 (×5): 22 [IU] via SUBCUTANEOUS
  Filled 2024-04-20 (×2): qty 10

## 2024-04-20 NOTE — Progress Notes (Signed)
 Date of Admission:  04/17/2024      ID: [name] is a 58 y.o. male Principal Problem:   Diabetic foot infection (HCC) Active Problems:   Obesity (BMI 30-39.9)   Hypertension associated with diabetes (HCC)   HLD (hyperlipidemia)   Chronic kidney disease, stage 3a (HCC)   Type II diabetes mellitus with renal manifestations (HCC)   Normocytic anemia    Subjective: Pt is doing okay No complaints   Medications:   vitamin C   500 mg Oral Daily   atorvastatin   20 mg Oral Daily   cyanocobalamin   1,000 mcg Intramuscular Q1200   Followed by   [name] ON 04/25/2024] vitamin B-12  1,000 mcg Oral Daily   gabapentin   300 mg Oral BID   heparin   5,000 Units Subcutaneous Q8H   insulin  aspart  0-15 Units Subcutaneous TID WC   insulin  aspart  0-5 Units Subcutaneous QHS   insulin  NPH Human  22 Units Subcutaneous BID AC & HS   iron polysaccharides  150 mg Oral Daily   Vitamin D  (Ergocalciferol )  50,000 Units Oral Q7 days    Objective: Vital signs in last 24 hours: Patient Vitals for the past 24 hrs:  BP Temp Temp src Pulse Resp SpO2  04/20/24 0759 (!) 145/83 97.8 F (36.6 C) -- 73 16 97 %  04/20/24 0547 (!) 148/82 -- -- -- -- --  04/20/24 0434 (!) 177/98 97.7 F (36.5 C) Oral 76 17 100 %  04/19/24 2001 (!) 149/80 97.6 F (36.4 C) Oral 77 17 99 %  04/19/24 1531 131/77 97.9 F (36.6 C) Oral 86 18 97 %      PHYSICAL EXAM:  General: Alert, cooperative, no distress, appears stated age.  Lungs: Clear to auscultation bilaterally. No Wheezing or Rhonchi. No rales. Heart: Regular rate and rhythm, no murmur, rub or gallop. Abdomen: Soft, non-tender,not distended. Bowel sounds normal. No masses Extremities: rt foot dressing not removed Picture from today reviewed   Looking cleaner after debridement of non viable soft tissue and skin  presurgery   Skin: No rashes or lesions. Or bruising Lymph: Cervical, supraclavicular normal. Neurologic: Grossly non-focal  Lab Results     Latest Ref Rng & Units 04/20/2024    4:32 AM 04/19/2024    3:24 AM 04/18/2024    5:37 AM  CBC  WBC 4.0 - 10.5 K/uL 12.2  9.3  8.4   Hemoglobin 13.0 - 17.0 g/dL 8.6  8.8  8.6   Hematocrit 39.0 - 52.0 % 27.8  28.3  26.9   Platelets 150 - 400 K/uL 442  417  344        Latest Ref Rng & Units 04/20/2024    4:32 AM 04/19/2024    3:24 AM 04/18/2024    5:37 AM  CMP  Glucose 70 - 99 mg/dL 161  096  045   BUN 6 - 20 mg/dL 18  21  15    Creatinine 0.61 - 1.24 mg/dL 4.09  8.11  9.14   Sodium 135 - 145 mmol/L 133  129  135   Potassium 3.5 - 5.1 mmol/L 4.5  5.1  4.2   Chloride 98 - 111 mmol/L 99  97  100   CO2 22 - 32 mmol/L 27  24  26    Calcium  8.9 - 10.3 mg/dL 8.1  8.0  8.1       Microbiology: Culture pending Studies/Results: No results found.   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

## 2024-04-20 NOTE — Progress Notes (Addendum)
 [facility] Progress Note  Patient: [name]    ZOX:[id number]  DOA: 04/17/2024     Date of Service: the patient was seen and examined on 04/20/2024  Chief Complaint  Patient presents with   Foot Pain   Brief hospital course: [name] is a 58 y.o. male with medical history significant of hypertension, hyperlipidemia, diabetes mellitus, CKD-3A, former smoker, obesity, diabetic foot ulcer, who presents with right foot wound and right foot pain.   Patient was recently hospitalized from 4/22 - 4/25 due to diabetic foot infection with gangrenous change. Patient is s/p of right greater toe first ray amputation. Pt has been on Cipro  as cultures from 5/1 showed Pseudomonas, but his wound is getting worse, now has dehiscence of wound.  Patient has further pain which is constant, 7 out of 10 in severity, aching, nonradiating.  No fever or chills.  Patient does not have chest pain, cough, SOB.  No nausea, vomiting, diarrhea or abdominal pain.  No symptoms of UTI.    Patient was seen by Dr. Francee Inch of ID today. She recommended patient to go to ED for MRI, IV antibiotic, Wound bedridment and cultures.  Dr. Fawn Hooks tooks many pictures of both feet (please see the picture in Dr. Sherryle Don note).   Data reviewed independently and ED Course: pt was found to have WBC 10.2, GFR> 60, lactic acid 0.9 --> 0.8, temperature normal, blood pressure 134/84, heart rate 100, RR 18, oxygen  saturation 97% on room air.  Patient is admitted to MedSurg bed as inpatient.  Dr. Althea Atkinson of podiatry is consulted by EDP.  MRI reviewed: Abscess and gas formation, possible osteomyelitis. second metatarsal, second proximal phalanx, and the base of second middle phalanx, concerning for osteomyelitis. base of third proximal phalanx and mild marrow edema of the head of third metatarsal, could reflect reactive marrow changes, however, osteomyelitis can not be excluded. Diffuse edema of the intrinsic musculature of the  foot, concerning for myositis.   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  Status post I&D 5/16.  Bone cultures taken at that time appear to be growing bacteria, susceptibilities pending.  Appreciate podiatry and infectious disease - continue  Zosyn  and linezolid  -Follow-up bone culture sensitivities - PRN Zofran  for nausea, and Tylenol , morphine  and Percocet for pain - Blood cultures NGTD, ESR 109 and CRP 12.8 elevated  - wound care consulted    # Hypertension associated with diabetes, patient is not taking amlodipine . Would likely benefit from ARB given CKD  # HLD (hyperlipidemia): Lipitor   # Chronic kidney disease, stage 3a: Renal function stable.  Improved this a.m.    Latest Ref Rng & Units 04/20/2024    4:32 AM 04/19/2024    3:24 AM 04/18/2024    5:37 AM  BMP  Glucose 70 - 99 mg/dL 811  914  782   BUN 6 - 20 mg/dL 18  21  15    Creatinine 0.61 - 1.24 mg/dL 9.56  2.13  0.86   Sodium 135 - 145 mmol/L 133  129  135   Potassium 3.5 - 5.1 mmol/L 4.5  5.1  4.2   Chloride 98 - 111 mmol/L 99  97  100   CO2 22 - 32 mmol/L 27  24  26    Calcium  8.9 - 10.3 mg/dL 8.1  8.0  8.1       # Type  II diabetes mellitus with renal manifestations [facility]): Poorly controlled. A1c 3 weeks ago 13.1.  Patient taking metformin  and NPH insulin  22 units twice daily -Increase sliding scale and increase NPH to 22u BID.     # Vitamin D  deficiency: started vitamin D  50,000 units p.o. weekly, follow with PCP to repeat vitamin D  level after 3 to 6 months.  # Vitamin B12 level 287, goal >400, started vitamin B12 1000 mcg IM injection during hospital stay followed by oral supplement.  # Iron deficiency anemia, transferrin saturation 9% Continue oral iron supplementation.  Had colonoscopy in 2024 but had a poor prep.  He is to have a colonoscopy repeated this  year, 2025 Follow-up with PCP to repeat iron profile after 3 to 6 months Folate level within normal range. Low vitamin B12 as above  Obesity class I Body mass index is 32.49 kg/m.  Interventions: Calorie restricted diet and daily exercise advised to lose body weight.  Lifestyle modification discussed.   Diet: Carb modified diet, low potassium diet DVT Prophylaxis: Subcutaneous Heparin     Advance goals of care discussion: Full code  Family Communication:  Discussed plan with patient and he is understanding.  Disposition:  Pt is from Home, admitted with Right foot abscess, DM foot infection and osteomyelitis, still on IV abx, may require further procedures  Discharge to Home, when stable and cleared by podiatry and ID.  Patient may need few days to improve.  Subjective:  Foot pain is 7/10.  Podiatry team at bedside.   Physical Exam:  Physical Exam  Constitutional: In no distress.  Cardiovascular: Normal rate, regular rhythm. No lower extremity edema  Pulmonary: Non labored breathing on room air, no wheezing or rales.   Abdominal: Soft. Normal bowel sounds. Non distended and non tender Neurological: Alert and oriented to person, place, and time. Non focal  Skin: Skin is warm and dry. Feet bandaged, c/d/I.    Vitals:   04/19/24 2001 04/20/24 0434 04/20/24 0547 04/20/24 0759  BP: (!) 149/80 (!) 177/98 (!) 148/82 (!) 145/83  Pulse: 77 76  73  Resp: 17 17  16   Temp: 97.6 F (36.4 C) 97.7 F (36.5 C)  97.8 F (36.6 C)  TempSrc: Oral Oral    SpO2: 99% 100%  97%  Weight:      Height:        Intake/Output Summary (Last 24 hours) at 04/20/2024 1054 Last data filed at 04/20/2024 0856 Gross per 24 hour  Intake 1020 ml  Output --  Net 1020 ml   Filed Weights   04/17/24 1255 04/18/24 1443  Weight: 99.8 kg 99.8 kg    Data Reviewed:   CBC: Recent Labs  Lab 04/17/24 1302 04/18/24 0537 04/19/24 0324 04/20/24 0432  WBC 10.2 8.4 9.3 12.2*  NEUTROABS 7.8*  --   --    --   HGB 9.0* 8.6* 8.8* 8.6*  HCT 29.2* 26.9* 28.3* 27.8*  MCV 82.0 80.1 81.3 81.3  PLT 383 344 417* 442*   Basic Metabolic Panel: Recent Labs  Lab 04/17/24 1302 04/18/24 0537 04/19/24 0324 04/20/24 0432  NA 133* 135 129* 133*  K 4.5 4.2 5.1 4.5  CL 98 100 97* 99  CO2 26 26 24 27   GLUCOSE 130* 140* 375* 344*  BUN 13 15 21* 18  CREATININE 1.38* 1.35* 1.56* 1.38*  CALCIUM  8.4* 8.1* 8.0* 8.1*  MG  --   --  1.9 2.1  PHOS  --   --  2.2* 2.5  Studies: No results found.   Scheduled Meds:  vitamin C   500 mg Oral Daily   atorvastatin   20 mg Oral Daily   cyanocobalamin   1,000 mcg Intramuscular Q1200   Followed by   [name] ON 04/25/2024] vitamin B-12  1,000 mcg Oral Daily   gabapentin   300 mg Oral BID   heparin   5,000 Units Subcutaneous Q8H   insulin  aspart  0-15 Units Subcutaneous TID WC   insulin  aspart  0-5 Units Subcutaneous QHS   insulin  NPH Human  20 Units Subcutaneous BID AC & HS   iron polysaccharides  150 mg Oral Daily   Vitamin D  (Ergocalciferol )  50,000 Units Oral Q7 days   Continuous Infusions:  linezolid  (ZYVOX ) IV 600 mg (04/20/24 0935)   piperacillin -tazobactam (ZOSYN )  IV 3.375 g (04/20/24 0437)   PRN Meds: acetaminophen , hydrALAZINE , morphine  injection, ondansetron  (ZOFRAN ) IV, oxyCODONE -acetaminophen   Time spent: 35 minutes  Author: Joette Mustard. MD [facility] 04/20/2024 10:55 AM  To reach On-call, see care teams to locate the attending and reach out to them via [email or web address]. If 7PM-7AM, please contact night-coverage If you still have difficulty reaching the attending provider, please page the [facility] (Director on Call) for [facility] on amion for assistance.

## 2024-04-20 NOTE — Progress Notes (Signed)
 Daily Progress Note   Subjective  - 2 Days Post-Op  Follow-up bilateral foot ulcerations.  He status post 2 days I&D right foot with bone biopsy and bone cultures.  Also large chronic left foot ulceration.  Patient resting bed comfortable  Objective Vitals:   04/19/24 2001 04/20/24 0434 04/20/24 0547 04/20/24 0759  BP: (!) 149/80 (!) 177/98 (!) 148/82 (!) 145/83  Pulse: 77 76  73  Resp: 17 17  16   Temp: 97.6 F (36.4 C) 97.7 F (36.5 C)  97.8 F (36.6 C)  TempSrc: Oral Oral    SpO2: 99% 100%  97%  Weight:      Height:        Physical Exam: Left foot evaluated today.  Large ulceration to the plantar aspect of the left forefoot around the first second third MTPJ region.  Also noted large ulcer at the fifth metatarsal base area.  These look to be stable at this point.  Granular tissue is noted.  Some surrounding hyperkeratotic tissue noted.  No active purulent drainage.  Right foot still with large defect from first ray amputation from previous.  Second MTPJ is exposed at this time.  Dressing removed.  Some mild to moderate drainage on the wound at this time as expected.  See clinical pictures.  Right foot    Left foot    Laboratory CBC    Component Value Date/Time   WBC 12.2 (H) 04/20/2024 0432   HGB 8.6 (L) 04/20/2024 0432   HCT 27.8 (L) 04/20/2024 0432   PLT 442 (H) 04/20/2024 0432    BMET    Component Value Date/Time   NA 133 (L) 04/20/2024 0432   K 4.5 04/20/2024 0432   CL 99 04/20/2024 0432   CO2 27 04/20/2024 0432   GLUCOSE 344 (H) 04/20/2024 0432   BUN 18 04/20/2024 0432   CREATININE 1.38 (H) 04/20/2024 0432   CREATININE 1.37 (H) 06/20/2022 1047   CALCIUM  8.1 (L) 04/20/2024 0432   GFRNONAA 60 (L) 04/20/2024 0432   GFRAA >60 12/29/2019 1005   I personally reviewed a CT scan performed from late April.  It shows deformity of the entire forefoot with dislocation of the lesser toe joint.  I believe this is causing chronic pressure to the forefoot making  the ulcerations very highly unlikely to heal.  There are areas of concern with small erosive changes to the forefoot regions as well.  Micro: Bone culture from the right second metatarsal is growing gram-positive cocci and gram-negative rods.  Awaiting final culture results.  Pathology is pending  Assessment/Planning: Bilateral foot ulcerations Osteomyelitis right forefoot Diabetes with neuropathy  .  We had a discussion regards to the left foot.  At this point it is stable.  There is just chronic ulceration that has maintained for some time now.  Given the amount of deformity through the left forefoot and the neuropathy to the forefoot I suspect that this will not heal from a long-term standpoint.  I discussed the possibility of performing a transmetatarsal amputation to the left forefoot to try to offload and remove the pressure from the region for closure of this wound.  For now continue with local wound care. On the right foot there is still chronic open ulceration.  Bone culture from the second metatarsal is growing gram-positive cocci and gram-negative rods.  Reintubated for further growth.  Awaiting final pathology at this time.  Had a long discussion regards to the likelihood of osteomyelitis to the second metatarsal and given  the fact he is already undergone first ray amputation removal of the second metatarsophalangeal joint would only cause further forefoot pressures and my recommendation would be a transmetatarsal amputation to allow for primary closure of the wound.  Will discuss with infectious disease.  Will continue to follow while inpatient.  Anell Baptist A  04/20/2024, 11:11 AM

## 2024-04-20 NOTE — Plan of Care (Signed)
  Problem: Coping: Goal: Ability to adjust to condition or change in health will improve Outcome: Progressing   Problem: Tissue Perfusion: Goal: Adequacy of tissue perfusion will improve Outcome: Progressing   Problem: Clinical Measurements: Goal: Ability to maintain clinical measurements within normal limits will improve Outcome: Progressing   Problem: Pain Managment: Goal: General experience of comfort will improve and/or be controlled Outcome: Progressing

## 2024-04-20 NOTE — Inpatient Diabetes Management (Signed)
 Inpatient Diabetes Program Recommendations  AACE/ADA: New Consensus Statement on Inpatient Glycemic Control (2015)  Target Ranges:  Prepandial:   less than 140 mg/dL      Peak postprandial:   less than 180 mg/dL (1-2 hours)      Critically ill patients:  140 - 180 mg/dL   Lab Results  Component Value Date   GLUCAP 348 (H) 04/19/2024   HGBA1C 13.1 (H) 03/26/2024    Review of Glycemic Control  Latest Reference Range & Units 04/19/24 08:18 04/19/24 12:22 04/19/24 17:03 04/19/24 21:46  Glucose-Capillary 70 - 99 mg/dL 161 (H) 096 (H) 045 (H) 348 (H)  (H): Data is abnormally high  Diabetes history: DM2 Outpatient Diabetes medications: NPH 22 units BID, Dexcom G6 Current orders for Inpatient glycemic control: NPH 16 units BID, Novolog  0-9 units TID and 0-5 units QHS   Inpatient Diabetes Program Recommendations:     Received Decadron  10 mg on 5/16.  Glucose trends > 300 mg/dL today.  Might consider:   Novolog  0-20 units TID and HS NPH 22 units BID-Home dose   Will continue to follow while inpatient.   Thank you, Hays Lipschutz, MSN, CDCES Diabetes Coordinator Inpatient Diabetes Program [phone] (team pager from 8a-5p)

## 2024-04-21 DIAGNOSIS — L089 Local infection of the skin and subcutaneous tissue, unspecified: Secondary | ICD-10-CM | POA: Diagnosis not present

## 2024-04-21 DIAGNOSIS — E11628 Type 2 diabetes mellitus with other skin complications: Secondary | ICD-10-CM | POA: Diagnosis not present

## 2024-04-21 DIAGNOSIS — B9562 Methicillin resistant Staphylococcus aureus infection as the cause of diseases classified elsewhere: Secondary | ICD-10-CM

## 2024-04-21 DIAGNOSIS — I96 Gangrene, not elsewhere classified: Secondary | ICD-10-CM | POA: Diagnosis not present

## 2024-04-21 DIAGNOSIS — T8130XA Disruption of wound, unspecified, initial encounter: Secondary | ICD-10-CM | POA: Diagnosis not present

## 2024-04-21 LAB — CBC
HCT: 27.9 % — ABNORMAL LOW (ref 39.0–52.0)
Hemoglobin: 8.4 g/dL — ABNORMAL LOW (ref 13.0–17.0)
MCH: 25 pg — ABNORMAL LOW (ref 26.0–34.0)
MCHC: 30.1 g/dL (ref 30.0–36.0)
MCV: 83 fL (ref 80.0–100.0)
Platelets: 424 10*3/uL — ABNORMAL HIGH (ref 150–400)
RBC: 3.36 MIL/uL — ABNORMAL LOW (ref 4.22–5.81)
RDW: 14 % (ref 11.5–15.5)
WBC: 8.9 10*3/uL (ref 4.0–10.5)
nRBC: 0 % (ref 0.0–0.2)

## 2024-04-21 LAB — BLOOD CULTURE ID PANEL (REFLEXED) - BCID2

## 2024-04-21 LAB — GLUCOSE, CAPILLARY
Glucose-Capillary: 190 mg/dL — ABNORMAL HIGH (ref 70–99)
Glucose-Capillary: 143 mg/dL — ABNORMAL HIGH (ref 70–99)
Glucose-Capillary: 122 mg/dL — ABNORMAL HIGH (ref 70–99)
Glucose-Capillary: 265 mg/dL — ABNORMAL HIGH (ref 70–99)

## 2024-04-21 LAB — BASIC METABOLIC PANEL WITH GFR
Anion gap: 5 (ref 5–15)
BUN: 17 mg/dL (ref 6–20)
CO2: 28 mmol/L (ref 22–32)
Calcium: 8 mg/dL — ABNORMAL LOW (ref 8.9–10.3)
Chloride: 102 mmol/L (ref 98–111)
Creatinine, Ser: 1.33 mg/dL — ABNORMAL HIGH (ref 0.61–1.24)
GFR, Estimated: >60 mL/min (ref >60)
Glucose, Bld: 225 mg/dL — ABNORMAL HIGH (ref 70–99)
Potassium: 4.2 mmol/L (ref 3.5–5.1)
Sodium: 135 mmol/L (ref 135–145)

## 2024-04-21 LAB — MAGNESIUM: Magnesium: 1.8 mg/dL (ref 1.7–2.4)

## 2024-04-21 LAB — PHOSPHORUS: Phosphorus: 3 mg/dL (ref 2.5–4.6)

## 2024-04-21 LAB — CK: Total CK: 45 U/L — ABNORMAL LOW (ref 49–397)

## 2024-04-21 MED ORDER — BISACODYL 10 MG RE SUPP: 10.0000 mg | Freq: Every day | RECTAL | Status: DC | PRN

## 2024-04-21 MED ORDER — POLYETHYLENE GLYCOL 3350 17 G PO PACK
17.0000 g | PACK | Freq: Two times a day (BID) | ORAL | Status: DC
  Administered 2024-04-21 (×2): 17 g via ORAL
  Filled 2024-04-21 (×12): qty 1

## 2024-04-21 MED ORDER — BISACODYL 5 MG PO TBEC
10.0000 mg | DELAYED_RELEASE_TABLET | Freq: Once | ORAL | Status: AC
  Administered 2024-04-21: 10 mg via ORAL
  Filled 2024-04-21: qty 2

## 2024-04-21 MED ORDER — DAPTOMYCIN-SODIUM CHLORIDE 700-0.9 MG/100ML-% IV SOLN
8.0000 mg/kg | Freq: Every day | INTRAVENOUS | Status: DC
  Administered 2024-04-21 – 2024-04-29 (×8): 700 mg via INTRAVENOUS
  Filled 2024-04-21 (×9): qty 100

## 2024-04-21 MED ORDER — BISACODYL 5 MG PO TBEC
10.0000 mg | DELAYED_RELEASE_TABLET | Freq: Every day | ORAL | Status: DC
  Administered 2024-04-21 – 2024-04-24 (×2): 10 mg via ORAL
  Filled 2024-04-21 (×7): qty 2

## 2024-04-21 NOTE — Plan of Care (Signed)

## 2024-04-21 NOTE — Progress Notes (Signed)
 [facility] Progress Note  Patient: [name]    ZOX:[id number]  DOA: 04/17/2024     Date of Service: the patient was seen and examined on 04/21/2024  Chief Complaint  Patient presents with   Foot Pain   Brief hospital course: [name] is a 58 y.o. male with medical history significant of hypertension, hyperlipidemia, diabetes mellitus, CKD-3A, former smoker, obesity, diabetic foot ulcer, who presents with right foot wound and right foot pain.   Patient was recently hospitalized from 4/22 - 4/25 due to diabetic foot infection with gangrenous change. Patient is s/p of right greater toe first ray amputation. Pt has been on Cipro  as cultures from 5/1 showed Pseudomonas, but his wound is getting worse, now has dehiscence of wound.  Patient has further pain which is constant, 7 out of 10 in severity, aching, nonradiating.  No fever or chills.  Patient does not have chest pain, cough, SOB.  No nausea, vomiting, diarrhea or abdominal pain.  No symptoms of UTI.    Patient was seen by Dr. Francee Inch of ID today. She recommended patient to go to ED for MRI, IV antibiotic, Wound bedridment and cultures.  Dr. Fawn Hooks tooks many pictures of both feet (please see the picture in Dr. Sherryle Don note).   Data reviewed independently and ED Course: pt was found to have WBC 10.2, GFR> 60, lactic acid 0.9 --> 0.8, temperature normal, blood pressure 134/84, heart rate 100, RR 18, oxygen  saturation 97% on room air.  Patient is admitted to MedSurg bed as inpatient.  Dr. Althea Atkinson of podiatry is consulted by EDP.  MRI reviewed: Abscess and gas formation, possible osteomyelitis. second metatarsal, second proximal phalanx, and the base of second middle phalanx, concerning for osteomyelitis. base of third proximal phalanx and mild marrow edema of the head of third metatarsal, could reflect reactive marrow changes, however, osteomyelitis can not be excluded. Diffuse edema of the intrinsic musculature of the  foot, concerning for myositis.   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  Status post I&D 5/16.  Bone cultures taken at that time appear to be growing bacteria, susceptibilities pending.  Appreciate podiatry and infectious disease - continue  Zosyn , changed linezolid , to daptomycin  on 5/19 as per ID -Follow-up bone culture sensitivities - PRN Zofran  for nausea, and Tylenol , morphine  and Percocet for pain - Blood cultures NGTD, ESR 109 and CRP 12.8 elevated  - wound care consulted    # Hypertension associated with diabetes, patient is not taking amlodipine . Would likely benefit from ARB given CKD  # HLD (hyperlipidemia): Lipitor   # Chronic kidney disease, stage 3a: Renal function stable.     # Type II diabetes mellitus with renal manifestations: Poorly controlled. A1c 3 weeks ago 13.1.  Patient taking metformin  and NPH insulin  22 units twice daily -Increase sliding scale and increase NPH to 22u BID.     # Vitamin D  deficiency: started vitamin D  50,000 units p.o. weekly, follow with PCP to repeat vitamin D  level after 3 to 6 months.  # Vitamin B12 level 287, goal >400, started vitamin B12 1000 mcg IM injection during hospital stay followed by oral supplement.  # Iron  deficiency anemia, transferrin saturation 9% Continue oral iron  supplementation.  Had colonoscopy in 2024 but had a poor prep.  He is to have a colonoscopy repeated this year, 2025 Follow-up with PCP to repeat  iron  profile after 3 to 6 months Folate level within normal range. Low vitamin B12 as above  # Constipation, started laxatives.  Obesity class I Body mass index is 32.49 kg/m.  Interventions: Calorie restricted diet and daily exercise advised to lose body weight.  Lifestyle modification discussed.   Diet: Carb modified diet, low potassium diet DVT  Prophylaxis: Subcutaneous Heparin     Advance goals of care discussion: Full code  Family Communication:  Discussed plan with patient and he is understanding.  Disposition:  Pt is from Home, admitted with Right foot abscess, DM foot infection and osteomyelitis, still on IV abx, may require further procedures  Discharge to Home, when stable and cleared by podiatry and ID.  Patient may need few days to improve.  Subjective:  Foot pain was 8/10 and after pain meds it is 7/10.    Physical Exam:  Physical Exam  Constitutional: In no distress.  Cardiovascular: Normal rate, regular rhythm. No lower extremity edema  Pulmonary: Non labored breathing on room air, no wheezing or rales.   Abdominal: Soft. Normal bowel sounds. Non distended and non tender Neurological: Alert and oriented to person, place, and time. Non focal  Skin: Skin is warm and dry. Feet bandaged, c/d/I.    Vitals:   04/20/24 1540 04/20/24 2052 04/21/24 0449 04/21/24 0820  BP: (!) 141/72 131/71 108/62 118/82  Pulse: 82 74 76 83  Resp: 16 18 17 18   Temp: 97.9 F (36.6 C) 97.6 F (36.4 C) 97.8 F (36.6 C) 98.4 F (36.9 C)  TempSrc: Oral Oral  Oral  SpO2: 94% 100% 98% 99%  Weight:      Height:        Intake/Output Summary (Last 24 hours) at 04/21/2024 1354 Last data filed at 04/21/2024 0640 Gross per 24 hour  Intake 1123.77 ml  Output --  Net 1123.77 ml   Filed Weights   04/17/24 1255 04/18/24 1443  Weight: 99.8 kg 99.8 kg    Data Reviewed:   CBC: Recent Labs  Lab 04/17/24 1302 04/18/24 0537 04/19/24 0324 04/20/24 0432 04/21/24 0222  WBC 10.2 8.4 9.3 12.2* 8.9  NEUTROABS 7.8*  --   --   --   --   HGB 9.0* 8.6* 8.8* 8.6* 8.4*  HCT 29.2* 26.9* 28.3* 27.8* 27.9*  MCV 82.0 80.1 81.3 81.3 83.0  PLT 383 344 417* 442* 424*   Basic Metabolic Panel: Recent Labs  Lab 04/17/24 1302 04/18/24 0537 04/19/24 0324 04/20/24 0432 04/21/24 0222  NA 133* 135 129* 133* 135  K 4.5 4.2 5.1 4.5 4.2  CL 98  100 97* 99 102  CO2 26 26 24 27 28   GLUCOSE 130* 140* 375* 344* 225*  BUN 13 15 21* 18 17  CREATININE 1.38* 1.35* 1.56* 1.38* 1.33*  CALCIUM  8.4* 8.1* 8.0* 8.1* 8.0*  MG  --   --  1.9 2.1 1.8  PHOS  --   --  2.2* 2.5 3.0    Studies: No results found.   Scheduled Meds:  vitamin C   500 mg Oral Daily   atorvastatin   20 mg Oral Daily   bisacodyl   10 mg Oral QHS   cyanocobalamin   1,000 mcg Intramuscular Q1200   Followed by   [name] ON 04/25/2024] vitamin B-12  1,000 mcg Oral Daily   gabapentin   300 mg Oral BID   heparin   5,000 Units Subcutaneous Q8H   insulin  aspart  0-15 Units Subcutaneous TID WC   insulin  aspart  0-5 Units Subcutaneous QHS  insulin  NPH Human  22 Units Subcutaneous BID AC & HS   iron  polysaccharides  150 mg Oral Daily   polyethylene glycol  17 g Oral BID   Vitamin D  (Ergocalciferol )  50,000 Units Oral Q7 days   Continuous Infusions:  DAPTOmycin  700 mg (04/21/24 1243)   piperacillin -tazobactam (ZOSYN )  IV 3.375 g (04/21/24 0642)   PRN Meds: acetaminophen , bisacodyl , hydrALAZINE , morphine  injection, ondansetron  (ZOFRAN ) IV, oxyCODONE -acetaminophen   Time spent: 35 minutes  Author: Joette Mustard. MD [facility] 04/21/2024 1:54 PM  To reach On-call, see care teams to locate the attending and reach out to them via [email or web address]. If 7PM-7AM, please contact night-coverage If you still have difficulty reaching the attending provider, please page the [facility] (Director on Call) for [facility] on amion for assistance.

## 2024-04-21 NOTE — Progress Notes (Signed)
 Date of Admission:  04/17/2024      ID: [name] is a 58 y.o. male Principal Problem:   Diabetic foot infection (HCC) Active Problems:   Obesity (BMI 30-39.9)   Hypertension associated with diabetes (HCC)   HLD (hyperlipidemia)   Chronic kidney disease, stage 3a (HCC)   Type II diabetes mellitus with renal manifestations (HCC)   Normocytic anemia    Subjective: Patient is sitting in chair No specific complaints   Medications:   vitamin C   500 mg Oral Daily   atorvastatin   20 mg Oral Daily   bisacodyl   10 mg Oral QHS   cyanocobalamin   1,000 mcg Intramuscular Q1200   Followed by   [name] ON 04/25/2024] vitamin B-12  1,000 mcg Oral Daily   gabapentin   300 mg Oral BID   heparin   5,000 Units Subcutaneous Q8H   insulin  aspart  0-15 Units Subcutaneous TID WC   insulin  aspart  0-5 Units Subcutaneous QHS   insulin  NPH Human  22 Units Subcutaneous BID AC & HS   iron  polysaccharides  150 mg Oral Daily   polyethylene glycol  17 g Oral BID   Vitamin D  (Ergocalciferol )  50,000 Units Oral Q7 days    Objective: Vital signs in last 24 hours: Patient Vitals for the past 24 hrs:  BP Temp Temp src Pulse Resp SpO2  04/21/24 0820 118/82 98.4 F (36.9 C) Oral 83 18 99 %  04/21/24 0449 108/62 97.8 F (36.6 C) -- 76 17 98 %  04/20/24 2052 131/71 97.6 F (36.4 C) Oral 74 18 100 %  04/20/24 1540 (!) 141/72 97.9 F (36.6 C) Oral 82 16 94 %      PHYSICAL EXAM:  General: Alert, cooperative, no distress, appears stated age.  Lungs: Clear to auscultation bilaterally. No Wheezing or Rhonchi. No rales. Heart: Regular rate and rhythm, no murmur, rub or gallop. Abdomen: Soft, non-tender,not distended. Bowel sounds normal. No masses Extremities:  Right foot dressing not removed Picture reviewed  Left foot chronic wounds on the plantar aspect  Skin: No rashes or lesions. Or bruising Lymph: Cervical, supraclavicular normal. Neurologic: Grossly non-focal  Lab Results    Latest Ref  Rng & Units 04/21/2024    2:22 AM 04/20/2024    4:32 AM 04/19/2024    3:24 AM  CBC  WBC 4.0 - 10.5 K/uL 8.9  12.2  9.3   Hemoglobin 13.0 - 17.0 g/dL 8.4  8.6  8.8   Hematocrit 39.0 - 52.0 % 27.9  27.8  28.3   Platelets 150 - 400 K/uL 424  442  417        Latest Ref Rng & Units 04/21/2024    2:22 AM 04/20/2024    4:32 AM 04/19/2024    3:24 AM  CMP  Glucose 70 - 99 mg/dL 102  725  366   BUN 6 - 20 mg/dL 17  18  21    Creatinine 0.61 - 1.24 mg/dL 4.40  3.47  4.25   Sodium 135 - 145 mmol/L 135  133  129   Potassium 3.5 - 5.1 mmol/L 4.2  4.5  5.1   Chloride 98 - 111 mmol/L 102  99  97   CO2 22 - 32 mmol/L 28  27  24    Calcium  8.9 - 10.3 mg/dL 8.0  8.1  8.0       Microbiology: Blood culture from 04/16/2024 has resulted as MRSA in 1 out of 4 bottles Surgical culture from the right foot has  MRSA and Pseudomonas    Assessment/Plan: MRSA bacteremia Low bioburden Time positivity is also more than 4 days The source of this is his right foot infection Diabetic foot infection Right great toe amputation Infection at the site of the amputation Staph aureus and Pseudomonas in the culture Very likely there is also MRSA This is the source Because of the chronicity of the wound at least present for 2 weeks unclear how long he would have had the bacteremia Will get a 2D echo Patient is currently on Zosyn  and linezolid  Will change linezolid  to daptomycin   Left foot chronic stable wounds  Diabetes mellitus not well-controlled  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 This involved complex antimicrobial management and counseling

## 2024-04-21 NOTE — Progress Notes (Signed)
 PHARMACY - PHYSICIAN COMMUNICATION CRITICAL VALUE ALERT - BLOOD CULTURE IDENTIFICATION (BCID)  [name] is an 58 y.o. male who presented to [facility] on 04/17/2024 with a chief complaint of worsening foot infection   Assessment:  blood cultures form 5/15 - on 5/19 1 bottle became positive for GPC, BCID = MRSA.  Patient With S aureus in foot cultures (susc pending).   Name of physician (or Provider) Contacted: Drs Francee Inch and Hubert Madden  Current antibiotics: linezolid  + piperacillin Augustin Leber  Changes to prescribed antibiotics recommended:  ID following - change linezolid  to Daptomycin    Results for orders placed or performed during the hospital encounter of 04/17/24  Blood Culture ID Panel (Reflexed) (Collected: 04/17/2024  1:02 PM)  Result Value Ref Range   Enterococcus faecalis NOT DETECTED NOT DETECTED   Enterococcus Faecium NOT DETECTED NOT DETECTED   Listeria monocytogenes NOT DETECTED NOT DETECTED   Staphylococcus species DETECTED (A) NOT DETECTED   Staphylococcus aureus (BCID) DETECTED (A) NOT DETECTED   Staphylococcus epidermidis NOT DETECTED NOT DETECTED   Staphylococcus lugdunensis NOT DETECTED NOT DETECTED   Streptococcus species NOT DETECTED NOT DETECTED   Streptococcus agalactiae NOT DETECTED NOT DETECTED   Streptococcus pneumoniae NOT DETECTED NOT DETECTED   Streptococcus pyogenes NOT DETECTED NOT DETECTED   A.calcoaceticus-baumannii NOT DETECTED NOT DETECTED   Bacteroides fragilis NOT DETECTED NOT DETECTED   Enterobacterales NOT DETECTED NOT DETECTED   Enterobacter cloacae complex NOT DETECTED NOT DETECTED   Escherichia coli NOT DETECTED NOT DETECTED   Klebsiella aerogenes NOT DETECTED NOT DETECTED   Klebsiella oxytoca NOT DETECTED NOT DETECTED   Klebsiella pneumoniae NOT DETECTED NOT DETECTED   Proteus species NOT DETECTED NOT DETECTED   Salmonella species NOT DETECTED NOT DETECTED   Serratia marcescens NOT DETECTED NOT DETECTED   Haemophilus influenzae NOT  DETECTED NOT DETECTED   Neisseria meningitidis NOT DETECTED NOT DETECTED   Pseudomonas aeruginosa NOT DETECTED NOT DETECTED   Stenotrophomonas maltophilia NOT DETECTED NOT DETECTED   Candida albicans NOT DETECTED NOT DETECTED   Candida auris NOT DETECTED NOT DETECTED   Candida glabrata NOT DETECTED NOT DETECTED   Candida krusei NOT DETECTED NOT DETECTED   Candida parapsilosis NOT DETECTED NOT DETECTED   Candida tropicalis NOT DETECTED NOT DETECTED   Cryptococcus neoformans/gattii NOT DETECTED NOT DETECTED   Meth resistant mecA/C and MREJ DETECTED (A) NOT DETECTED    Valma Gazella, PharmD, BCPS, BCIDP Work Cell: [phone] 04/21/2024 8:39 AM

## 2024-04-21 NOTE — Progress Notes (Signed)
 Chart reviewed. Both bone and swab culture from right foot are growing organisms.  Awaiting final pathology.   Will f/u tomorrow to discuss plan going forward.  Will likely need TMA on right side.

## 2024-04-21 NOTE — Plan of Care (Signed)
  Problem: Health Behavior/Discharge Planning: Goal: Ability to manage health-related needs will improve Outcome: Progressing   Problem: Metabolic: Goal: Ability to maintain appropriate glucose levels will improve Outcome: Progressing   Problem: Nutritional: Goal: Maintenance of adequate nutrition will improve Outcome: Progressing   Problem: Education: Goal: Knowledge of General Education information will improve Description: Including pain rating scale, medication(s)/side effects and non-pharmacologic comfort measures Outcome: Progressing

## 2024-04-21 NOTE — Inpatient Diabetes Management (Signed)
 Inpatient Diabetes Program Recommendations  AACE/ADA: New Consensus Statement on Inpatient Glycemic Control (2015)  Target Ranges:  Prepandial:   less than 140 mg/dL      Peak postprandial:   less than 180 mg/dL (1-2 hours)      Critically ill patients:  140 - 180 mg/dL   Lab Results  Component Value Date   GLUCAP 143 (H) 04/21/2024   HGBA1C 13.1 (H) 03/26/2024    Review of Glycemic Control  Latest Reference Range & Units 04/20/24 07:56 04/20/24 11:43 04/20/24 16:44 04/20/24 21:25 04/21/24 08:22 04/21/24 12:03  Glucose-Capillary 70 - 99 mg/dL 147 (H) 829 (H) 562 (H) 304 (H) 190 (H) 143 (H)  (H): Data is abnormally high Diabetes history: Type 2 DM Outpatient Diabetes medications: NPH 22 units BID, Metformin  1000 mg BID Current orders for Inpatient glycemic control: NPH 22 units BID, Novolog  0-15 units TID & HS  Inpatient Diabetes Program Recommendations:    Spoke with patient regarding outpatient diabetes management. Patient was recently admitted and seen on 4/24 by DM team. Since patient reports that CBGs per Dexcom have been doing much better. Has additional sensors at home. Plans to follow up with PCP in June. Reviewed patient's previous A1c of 13.1%. Explained what a A1c is and what it measures. Also reviewed goal A1c with patient, importance of good glucose control @ home, and blood sugar goals. Will need prescription for insulin  when discharged.  Has been working to reduce sugary beverages. Encouraged continued CHO mindfulness.  No additional questions at this time.   Thanks, Marjo Sievert, MSN, RNC-OB Diabetes Coordinator [phone] (8a-5p)

## 2024-04-22 ENCOUNTER — Inpatient Hospital Stay (HOSPITAL_COMMUNITY)
Admit: 2024-04-22 | Discharge: 2024-04-22 | Disposition: A | Discharge status: Home / Self Care | Attending: Infectious Diseases | Admitting: Infectious Diseases

## 2024-04-22 ENCOUNTER — Encounter: Admitting: Orthopedic Surgery

## 2024-04-22 ENCOUNTER — Inpatient Hospital Stay: Admitting: Nurse Practitioner

## 2024-04-22 ENCOUNTER — Inpatient Hospital Stay

## 2024-04-22 ENCOUNTER — Encounter: Payer: Self-pay | Admitting: Podiatry

## 2024-04-22 DIAGNOSIS — E11628 Type 2 diabetes mellitus with other skin complications: Secondary | ICD-10-CM | POA: Diagnosis not present

## 2024-04-22 DIAGNOSIS — R7881 Bacteremia: Secondary | ICD-10-CM

## 2024-04-22 DIAGNOSIS — B965 Pseudomonas (aeruginosa) (mallei) (pseudomallei) as the cause of diseases classified elsewhere: Secondary | ICD-10-CM

## 2024-04-22 DIAGNOSIS — B9562 Methicillin resistant Staphylococcus aureus infection as the cause of diseases classified elsewhere: Secondary | ICD-10-CM | POA: Diagnosis not present

## 2024-04-22 DIAGNOSIS — L089 Local infection of the skin and subcutaneous tissue, unspecified: Secondary | ICD-10-CM | POA: Diagnosis not present

## 2024-04-22 DIAGNOSIS — T8130XA Disruption of wound, unspecified, initial encounter: Secondary | ICD-10-CM | POA: Diagnosis not present

## 2024-04-22 DIAGNOSIS — I96 Gangrene, not elsewhere classified: Secondary | ICD-10-CM | POA: Diagnosis not present

## 2024-04-22 LAB — GLUCOSE, CAPILLARY
Glucose-Capillary: 123 mg/dL — ABNORMAL HIGH (ref 70–99)
Glucose-Capillary: 73 mg/dL (ref 70–99)
Glucose-Capillary: 152 mg/dL — ABNORMAL HIGH (ref 70–99)
Glucose-Capillary: 215 mg/dL — ABNORMAL HIGH (ref 70–99)

## 2024-04-22 LAB — CBC
HCT: 26.2 % — ABNORMAL LOW (ref 39.0–52.0)
Hemoglobin: 8.2 g/dL — ABNORMAL LOW (ref 13.0–17.0)
MCH: 25.5 pg — ABNORMAL LOW (ref 26.0–34.0)
MCHC: 31.3 g/dL (ref 30.0–36.0)
MCV: 81.4 fL (ref 80.0–100.0)
Platelets: 448 10*3/uL — ABNORMAL HIGH (ref 150–400)
RBC: 3.22 MIL/uL — ABNORMAL LOW (ref 4.22–5.81)
RDW: 14.2 % (ref 11.5–15.5)
WBC: 9 10*3/uL (ref 4.0–10.5)
nRBC: 0 % (ref 0.0–0.2)

## 2024-04-22 LAB — BASIC METABOLIC PANEL WITH GFR
Anion gap: 5 (ref 5–15)
BUN: 17 mg/dL (ref 6–20)
CO2: 26 mmol/L (ref 22–32)
Calcium: 8 mg/dL — ABNORMAL LOW (ref 8.9–10.3)
Chloride: 104 mmol/L (ref 98–111)
Creatinine, Ser: 1.34 mg/dL — ABNORMAL HIGH (ref 0.61–1.24)
GFR, Estimated: >60 mL/min (ref >60)
Glucose, Bld: 156 mg/dL — ABNORMAL HIGH (ref 70–99)
Potassium: 4.3 mmol/L (ref 3.5–5.1)
Sodium: 135 mmol/L (ref 135–145)

## 2024-04-22 LAB — CULTURE, BLOOD (ROUTINE X 2): Culture: NO GROWTH

## 2024-04-22 LAB — MRSA NEXT GEN BY PCR, NASAL: MRSA by PCR Next Gen: DETECTED — AB

## 2024-04-22 LAB — SURGICAL PATHOLOGY

## 2024-04-22 MED ORDER — LOSARTAN POTASSIUM 25 MG PO TABS
25.0000 mg | ORAL_TABLET | Freq: Every day | ORAL | Status: DC
  Administered 2024-04-22 – 2024-04-29 (×8): 25 mg via ORAL
  Filled 2024-04-22 (×8): qty 1

## 2024-04-22 MED ORDER — POVIDONE-IODINE 10 % EX SWAB: 2.0000 | Freq: Once | CUTANEOUS | Status: DC

## 2024-04-22 MED ORDER — CHLORHEXIDINE GLUCONATE 4 % EX SOLN
60.0000 mL | Freq: Once | CUTANEOUS | Status: AC
  Administered 2024-04-23: 4 via TOPICAL

## 2024-04-22 MED ORDER — SODIUM CHLORIDE 0.9 % IV SOLN
1.0000 g | Freq: Three times a day (TID) | INTRAVENOUS | Status: DC
  Administered 2024-04-22 – 2024-04-29 (×20): 1 g via INTRAVENOUS
  Filled 2024-04-22 (×21): qty 20

## 2024-04-22 NOTE — Plan of Care (Signed)

## 2024-04-22 NOTE — Progress Notes (Signed)
 [facility] Progress Note  Patient: [name]    ZOX:[id number]  DOA: 04/17/2024     Date of Service: the patient was seen and examined on 04/22/2024  Chief Complaint  Patient presents with   Foot Pain   Brief hospital course: [name] is a 58 y.o. male with medical history significant of hypertension, hyperlipidemia, diabetes mellitus, CKD-3A, former smoker, obesity, diabetic foot ulcer, who presents with right foot wound and right foot pain.   Patient was recently hospitalized from 4/22 - 4/25 due to diabetic foot infection with gangrenous change. Patient is s/p of right greater toe first ray amputation. Pt has been on Cipro  as cultures from 5/1 showed Pseudomonas, but his wound is getting worse, now has dehiscence of wound.  Patient has further pain which is constant, 7 out of 10 in severity, aching, nonradiating.  No fever or chills.  Patient does not have chest pain, cough, SOB.  No nausea, vomiting, diarrhea or abdominal pain.  No symptoms of UTI.    Patient was seen by Dr. Francee Inch of ID today. She recommended patient to go to ED for MRI, IV antibiotic, Wound bedridment and cultures.  Dr. Fawn Hooks tooks many pictures of both feet (please see the picture in Dr. Sherryle Don note).   Data reviewed independently and ED Course: pt was found to have WBC 10.2, GFR> 60, lactic acid 0.9 --> 0.8, temperature normal, blood pressure 134/84, heart rate 100, RR 18, oxygen  saturation 97% on room air.  Patient is admitted to MedSurg bed as inpatient.  Dr. Althea Atkinson of podiatry is consulted by EDP.  MRI reviewed: Abscess and gas formation, possible osteomyelitis. second metatarsal, second proximal phalanx, and the base of second middle phalanx, concerning for osteomyelitis. base of third proximal phalanx and mild marrow edema of the head of third metatarsal, could reflect reactive marrow changes, however, osteomyelitis can not be excluded. Diffuse edema of the intrinsic musculature of the  foot, concerning for myositis.   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  Status post I&D 5/16.  Bone cultures taken at that time appear to be growing bacteria, susceptibilities pending.  Appreciate podiatry and infectious disease - continue  Zosyn , changed linezolid , to daptomycin  on 5/19 as per ID - PRN Zofran  for nausea, and Tylenol , morphine  and Percocet for pain - Blood cultures NGTD, ESR 109 and CRP 12.8 elevated  - wound care consulted 5/16 tissue culture from the OR is growing MRSA, Klebsiella and Pseudomonas. Follow ID for antibiotics and duration of treatment and discharge planning.   # Hypertension associated with diabetes, patient is not taking amlodipine . Would likely benefit from ARB given CKD  # HLD (hyperlipidemia): Lipitor   # Chronic kidney disease, stage 3a: Renal function stable.     # Type II diabetes mellitus with renal manifestations: Poorly controlled. A1c 3 weeks ago 13.1.  Patient taking metformin  and NPH insulin  22 units twice daily -Increase sliding scale and increase NPH to 22u BID.     # Vitamin D  deficiency: started vitamin D  50,000 units p.o. weekly, follow with PCP to repeat vitamin D  level after 3 to 6 months.  # Vitamin B12 level 287, goal >400, started vitamin B12 1000 mcg IM injection during hospital stay followed by oral supplement.  # Iron  deficiency anemia, transferrin saturation 9% Continue oral iron  supplementation.  Had colonoscopy in 2024 but had a poor  prep.  He is to have a colonoscopy repeated this year, 2025 Follow-up with PCP to repeat iron  profile after 3 to 6 months Folate level within normal range. Low vitamin B12 as above  # Constipation, started laxatives.  Obesity class I Body mass index is 32.49 kg/m.  Interventions: Calorie restricted diet and daily exercise advised to lose  body weight.  Lifestyle modification discussed.   Diet: Carb modified diet, low potassium diet DVT Prophylaxis: Subcutaneous Heparin     Advance goals of care discussion: Full code  Family Communication:  Discussed plan with patient and he is understanding.  Disposition:  Pt is from Home, admitted with Right foot abscess, DM foot infection and osteomyelitis, still on IV abx, may require further procedures  Discharge to Home, when stable and cleared by podiatry and ID.  Patient may need few days to improve.  Subjective: No significant events overnight, patient was sitting comfortably on the bed.  Pain in the foot is 7/10, improves with pain medications.  Denied any other complaints.   Physical Exam:  Physical Exam  Constitutional: In no distress.  Cardiovascular: Normal rate, regular rhythm. No lower extremity edema  Pulmonary: Non labored breathing on room air, no wheezing or rales.   Abdominal: Soft. Normal bowel sounds. Non distended and non tender Neurological: Alert and oriented to person, place, and time. Non focal  Skin: Skin is warm and dry. Feet bandaged, c/d/I.    Vitals:   04/21/24 1556 04/21/24 1950 04/22/24 0521 04/22/24 0804  BP: 134/78 129/89 123/78 130/84  Pulse: 88 95 84 87  Resp: 18  17 19   Temp: 98.4 F (36.9 C) 98.5 F (36.9 C) 98.3 F (36.8 C) 98.5 F (36.9 C)  TempSrc: Oral     SpO2: 98% 98% 99% 100%  Weight:      Height:        Intake/Output Summary (Last 24 hours) at 04/22/2024 1507 Last data filed at 04/22/2024 1357 Gross per 24 hour  Intake 1035.11 ml  Output --  Net 1035.11 ml   Filed Weights   04/17/24 1255 04/18/24 1443  Weight: 99.8 kg 99.8 kg    Data Reviewed:   CBC: Recent Labs  Lab 04/17/24 1302 04/18/24 0537 04/19/24 0324 04/20/24 0432 04/21/24 0222 04/22/24 0447  WBC 10.2 8.4 9.3 12.2* 8.9 9.0  NEUTROABS 7.8*  --   --   --   --   --   HGB 9.0* 8.6* 8.8* 8.6* 8.4* 8.2*  HCT 29.2* 26.9* 28.3* 27.8* 27.9* 26.2*  MCV  82.0 80.1 81.3 81.3 83.0 81.4  PLT 383 344 417* 442* 424* 448*   Basic Metabolic Panel: Recent Labs  Lab 04/18/24 0537 04/19/24 0324 04/20/24 0432 04/21/24 0222 04/22/24 0447  NA 135 129* 133* 135 135  K 4.2 5.1 4.5 4.2 4.3  CL 100 97* 99 102 104  CO2 26 24 27 28 26   GLUCOSE 140* 375* 344* 225* 156*  BUN 15 21* 18 17 17   CREATININE 1.35* 1.56* 1.38* 1.33* 1.34*  CALCIUM  8.1* 8.0* 8.1* 8.0* 8.0*  MG  --  1.9 2.1 1.8  --   PHOS  --  2.2* 2.5 3.0  --     Studies: No results found.   Scheduled Meds:  vitamin C   500 mg Oral Daily   atorvastatin   20 mg Oral Daily   bisacodyl   10 mg Oral QHS   cyanocobalamin   1,000 mcg Intramuscular Q1200   Followed by   [name] ON 04/25/2024] vitamin B-12  1,000 mcg Oral Daily   gabapentin   300 mg Oral BID   heparin   5,000 Units Subcutaneous Q8H   insulin  aspart  0-15 Units Subcutaneous TID WC   insulin  aspart  0-5 Units Subcutaneous QHS   insulin  NPH Human  22 Units Subcutaneous BID AC & HS   iron  polysaccharides  150 mg Oral Daily   losartan  25 mg Oral Daily   polyethylene glycol  17 g Oral BID   Vitamin D  (Ergocalciferol )  50,000 Units Oral Q7 days   Continuous Infusions:  DAPTOmycin  700 mg (04/22/24 1335)   piperacillin -tazobactam (ZOSYN )  IV 3.375 g (04/22/24 0544)   PRN Meds: acetaminophen , bisacodyl , hydrALAZINE , morphine  injection, ondansetron  (ZOFRAN ) IV, oxyCODONE -acetaminophen   Time spent: 35 minutes  Author: Joette Mustard. MD [facility] 04/22/2024 3:07 PM  To reach On-call, see care teams to locate the attending and reach out to them via [email or web address]. If 7PM-7AM, please contact night-coverage If you still have difficulty reaching the attending provider, please page the [facility] (Director on Call) for [facility] on amion for assistance.

## 2024-04-22 NOTE — Progress Notes (Signed)
 Daily Progress Note   Subjective  - 4 Days Post-Op  Follow-up right foot debridement with bone cultures.  Patient now has positive blood culture from previous with Staph aureus.  Objective Vitals:   04/21/24 1556 04/21/24 1950 04/22/24 0521 04/22/24 0804  BP: 134/78 129/89 123/78 130/84  Pulse: 88 95 84 87  Resp: 18  17 19   Temp: 98.4 F (36.9 C) 98.5 F (36.9 C) 98.3 F (36.8 C) 98.5 F (36.9 C)  TempSrc: Oral     SpO2: 98% 98% 99% 100%  Weight:      Height:        Physical Exam: Still large open necrotic wound with fibrotic tissue and increased drainage from the wound site.  The second metatarsophalangeal joint metatarsal head is a slightly darker gray discoloration at this time.  Laboratory CBC    Component Value Date/Time   WBC 9.0 04/22/2024 0447   HGB 8.2 (L) 04/22/2024 0447   HCT 26.2 (L) 04/22/2024 0447   PLT 448 (H) 04/22/2024 0447    BMET    Component Value Date/Time   NA 135 04/22/2024 0447   K 4.3 04/22/2024 0447   CL 104 04/22/2024 0447   CO2 26 04/22/2024 0447   GLUCOSE 156 (H) 04/22/2024 0447   BUN 17 04/22/2024 0447   CREATININE 1.34 (H) 04/22/2024 0447   CREATININE 1.37 (H) 06/20/2022 1047   CALCIUM  8.0 (L) 04/22/2024 0447   GFRNONAA >60 04/22/2024 0447   GFRAA >60 12/29/2019 1005    Assessment/Planning: Nonhealing right foot ulceration Diabetes with neuropathy Dehisced right foot wound Osteomyelitis right foot  Given the large defect and the continued worsening tissue in this area as well as recent positive blood culture and positive bone culture at this time I recommended transmetatarsal amputation.  I recommend thorough debridement of the wound to attempt to remove all residual infectious process in the region.  I discussed the risk benefits alternatives and complications associated with surgery at this time.  He understands the risk and consent has been given.  Will plan on surgery tomorrow.  Orders will be placed today.   Anell Baptist A  04/22/2024, 1:00 PM

## 2024-04-22 NOTE — Progress Notes (Signed)
 Date of Admission:  04/17/2024      ID: [name] is a 58 y.o. male Principal Problem:   Diabetic foot infection (HCC) Active Problems:   Obesity (BMI 30-39.9)   Hypertension associated with diabetes (HCC)   HLD (hyperlipidemia)   Chronic kidney disease, stage 3a (HCC)   Type II diabetes mellitus with renal manifestations (HCC)   Normocytic anemia   MRSA bacteremia    Subjective: Patient is sitting in chair No specific complaints   Medications:   vitamin C   500 mg Oral Daily   atorvastatin   20 mg Oral Daily   bisacodyl   10 mg Oral QHS   chlorhexidine   60 mL Topical Once   cyanocobalamin   1,000 mcg Intramuscular Q1200   Followed by   [name] ON 04/25/2024] vitamin B-12  1,000 mcg Oral Daily   gabapentin   300 mg Oral BID   heparin   5,000 Units Subcutaneous Q8H   insulin  aspart  0-15 Units Subcutaneous TID WC   insulin  aspart  0-5 Units Subcutaneous QHS   insulin  NPH Human  22 Units Subcutaneous BID AC & HS   iron  polysaccharides  150 mg Oral Daily   losartan  25 mg Oral Daily   polyethylene glycol  17 g Oral BID   povidone-iodine   2 Application Topical Once   Vitamin D  (Ergocalciferol )  50,000 Units Oral Q7 days    Objective: Vital signs in last 24 hours: Patient Vitals for the past 24 hrs:  BP Temp Pulse Resp SpO2  04/22/24 2007 (!) 147/87 98.4 F (36.9 C) 90 16 99 %  04/22/24 1612 137/89 98.2 F (36.8 C) 94 18 99 %  04/22/24 0804 130/84 98.5 F (36.9 C) 87 19 100 %  04/22/24 0521 123/78 98.3 F (36.8 C) 84 17 99 %      PHYSICAL EXAM:  General: Alert, cooperative, no distress, appears stated age.  Lungs: Clear to auscultation bilaterally. No Wheezing or Rhonchi. No rales. Heart: Regular rate and rhythm, no murmur, rub or gallop. Abdomen: Soft, non-tender,not distended. Bowel sounds normal. No masses Extremities:  Right foot dressing removed        Left foot chronic wounds on the plantar aspect  Skin: No rashes or lesions. Or bruising Lymph:  Cervical, supraclavicular normal. Neurologic: Grossly non-focal  Lab Results    Latest Ref Rng & Units 04/22/2024    4:47 AM 04/21/2024    2:22 AM 04/20/2024    4:32 AM  CBC  WBC 4.0 - 10.5 K/uL 9.0  8.9  12.2   Hemoglobin 13.0 - 17.0 g/dL 8.2  8.4  8.6   Hematocrit 39.0 - 52.0 % 26.2  27.9  27.8   Platelets 150 - 400 K/uL 448  424  442        Latest Ref Rng & Units 04/22/2024    4:47 AM 04/21/2024    2:22 AM 04/20/2024    4:32 AM  CMP  Glucose 70 - 99 mg/dL 295  621  308   BUN 6 - 20 mg/dL 17  17  18    Creatinine 0.61 - 1.24 mg/dL 6.57  8.46  9.62   Sodium 135 - 145 mmol/L 135  135  133   Potassium 3.5 - 5.1 mmol/L 4.3  4.2  4.5   Chloride 98 - 111 mmol/L 104  102  99   CO2 22 - 32 mmol/L 26  28  27    Calcium  8.9 - 10.3 mg/dL 8.0  8.0  8.1  Microbiology: Blood culture from 04/16/2024 has resulted as MRSA in 1 out of 4 bottles Surgical culture from the right foot has MRSA and Pseudomonas    Assessment/Plan: MRSA bacteremia Low bioburden Time positivity is also more than 4 days The source of this is his right foot infection Diabetic foot infection Right great toe amputation Infection at the site of the amputation MRSA and Pseudomonas  and ESBL kleb in the culture This is the source for bacteremia Because of the chronicity of the wound at least present for 2 weeks unclear how long he would have had the bacteremia Will get a 2D echo Pt is currently on zosyn  and dapto- change zosyn  to Meropenem  Podiatrist planning for TMA Left foot chronic stable wounds  Diabetes mellitus not well-controlled  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

## 2024-04-23 ENCOUNTER — Inpatient Hospital Stay

## 2024-04-23 ENCOUNTER — Inpatient Hospital Stay: Admitting: Anesthesiology

## 2024-04-23 ENCOUNTER — Encounter: Payer: Self-pay | Admitting: Internal Medicine

## 2024-04-23 ENCOUNTER — Other Ambulatory Visit: Payer: Self-pay

## 2024-04-23 ENCOUNTER — Encounter
Admission: EM | Disposition: A | Discharge status: Home Health | Payer: Self-pay | Source: Home / Self Care | Attending: Student

## 2024-04-23 DIAGNOSIS — L089 Local infection of the skin and subcutaneous tissue, unspecified: Secondary | ICD-10-CM | POA: Diagnosis not present

## 2024-04-23 DIAGNOSIS — E11628 Type 2 diabetes mellitus with other skin complications: Secondary | ICD-10-CM | POA: Diagnosis not present

## 2024-04-23 HISTORY — PX: TRANSMETATARSAL AMPUTATION: SHX6197

## 2024-04-23 LAB — AEROBIC/ANAEROBIC CULTURE W GRAM STAIN (SURGICAL/DEEP WOUND): Gram Stain: NONE SEEN

## 2024-04-23 LAB — BASIC METABOLIC PANEL WITH GFR
Anion gap: 6 (ref 5–15)
BUN: 17 mg/dL (ref 6–20)
CO2: 28 mmol/L (ref 22–32)
Calcium: 8.3 mg/dL — ABNORMAL LOW (ref 8.9–10.3)
Chloride: 103 mmol/L (ref 98–111)
Creatinine, Ser: 1.39 mg/dL — ABNORMAL HIGH (ref 0.61–1.24)
GFR, Estimated: 59 mL/min — ABNORMAL LOW (ref >60)
Glucose, Bld: 273 mg/dL — ABNORMAL HIGH (ref 70–99)
Potassium: 4.8 mmol/L (ref 3.5–5.1)
Sodium: 137 mmol/L (ref 135–145)

## 2024-04-23 LAB — GLUCOSE, CAPILLARY
Glucose-Capillary: 255 mg/dL — ABNORMAL HIGH (ref 70–99)
Glucose-Capillary: 124 mg/dL — ABNORMAL HIGH (ref 70–99)
Glucose-Capillary: 118 mg/dL — ABNORMAL HIGH (ref 70–99)
Glucose-Capillary: 115 mg/dL — ABNORMAL HIGH (ref 70–99)
Glucose-Capillary: 244 mg/dL — ABNORMAL HIGH (ref 70–99)

## 2024-04-23 LAB — ECHOCARDIOGRAM COMPLETE
AR max vel: 2.72 cm2
AV Area VTI: 2.62 cm2
AV Area mean vel: 2.69 cm2
AV Mean grad: 4.7 mmHg
AV Peak grad: 8.7 mmHg
Ao pk vel: 1.48 m/s
Area-P 1/2: 3.81 cm2
Height: 69 in
S' Lateral: 2.6 cm
Weight: 3520 [oz_av]

## 2024-04-23 LAB — CBC
HCT: 27.5 % — ABNORMAL LOW (ref 39.0–52.0)
Hemoglobin: 8.5 g/dL — ABNORMAL LOW (ref 13.0–17.0)
MCH: 25.3 pg — ABNORMAL LOW (ref 26.0–34.0)
MCHC: 30.9 g/dL (ref 30.0–36.0)
MCV: 81.8 fL (ref 80.0–100.0)
Platelets: 467 10*3/uL — ABNORMAL HIGH (ref 150–400)
RBC: 3.36 MIL/uL — ABNORMAL LOW (ref 4.22–5.81)
RDW: 14.4 % (ref 11.5–15.5)
WBC: 10.2 10*3/uL (ref 4.0–10.5)
nRBC: 0 % (ref 0.0–0.2)

## 2024-04-23 SURGERY — AMPUTATION, FOOT, TRANSMETATARSAL
Anesthesia: General | Site: Toe | Laterality: Right

## 2024-04-23 MED ORDER — FENTANYL CITRATE (PF) 100 MCG/2ML IJ SOLN: 25.0000 ug | INTRAMUSCULAR | Status: DC | PRN

## 2024-04-23 MED ORDER — VANCOMYCIN HCL 1000 MG IV SOLR
INTRAVENOUS | Status: DC | PRN
  Administered 2024-04-23: 1000 mg via TOPICAL

## 2024-04-23 MED ORDER — OXYCODONE HCL 5 MG/5ML PO SOLN: 5.0000 mg | Freq: Once | ORAL | Status: DC | PRN

## 2024-04-23 MED ORDER — VANCOMYCIN HCL 1000 MG IV SOLR
INTRAVENOUS | Status: AC
  Filled 2024-04-23: qty 20

## 2024-04-23 MED ORDER — PHENYLEPHRINE HCL-NACL 20-0.9 MG/250ML-% IV SOLN
INTRAVENOUS | Status: DC | PRN
  Administered 2024-04-23: 50 ug/min via INTRAVENOUS

## 2024-04-23 MED ORDER — INSULIN NPH (HUMAN) (ISOPHANE) 100 UNIT/ML ~~LOC~~ SUSP
22.0000 [IU] | Freq: Two times a day (BID) | SUBCUTANEOUS | Status: DC
  Administered 2024-04-24 – 2024-04-29 (×11): 22 [IU] via SUBCUTANEOUS

## 2024-04-23 MED ORDER — SODIUM CHLORIDE 0.9 % IV SOLN: INTRAVENOUS | Status: DC | PRN

## 2024-04-23 MED ORDER — PHENYLEPHRINE 80 MCG/ML (10ML) SYRINGE FOR IV PUSH (FOR BLOOD PRESSURE SUPPORT)
PREFILLED_SYRINGE | INTRAVENOUS | Status: DC | PRN
  Administered 2024-04-23 (×3): 80 ug via INTRAVENOUS

## 2024-04-23 MED ORDER — LIDOCAINE HCL (CARDIAC) PF 100 MG/5ML IV SOSY
PREFILLED_SYRINGE | INTRAVENOUS | Status: DC | PRN
  Administered 2024-04-23: 100 mg via INTRAVENOUS

## 2024-04-23 MED ORDER — BUPIVACAINE HCL (PF) 0.5 % IJ SOLN
INTRAMUSCULAR | Status: AC
  Filled 2024-04-23: qty 30

## 2024-04-23 MED ORDER — MIDAZOLAM HCL 2 MG/2ML IJ SOLN
INTRAMUSCULAR | Status: DC | PRN
  Administered 2024-04-23: 2 mg via INTRAVENOUS

## 2024-04-23 MED ORDER — THROMBIN 5000 UNITS EX KIT
PACK | CUTANEOUS | Status: AC
  Filled 2024-04-23: qty 1

## 2024-04-23 MED ORDER — MIDAZOLAM HCL 2 MG/2ML IJ SOLN
INTRAMUSCULAR | Status: AC
  Filled 2024-04-23: qty 2

## 2024-04-23 MED ORDER — OXYCODONE HCL 5 MG PO TABS: 5.0000 mg | ORAL_TABLET | Freq: Once | ORAL | Status: DC | PRN

## 2024-04-23 MED ORDER — BUPIVACAINE HCL (PF) 0.5 % IJ SOLN
INTRAMUSCULAR | Status: DC | PRN
  Administered 2024-04-23: 20 mL

## 2024-04-23 MED ORDER — MIDAZOLAM HCL 2 MG/2ML IJ SOLN
INTRAMUSCULAR | Status: AC
Start: 2024-04-23 — End: ?
  Filled 2024-04-23: qty 2

## 2024-04-23 MED ORDER — LIDOCAINE HCL (PF) 1 % IJ SOLN
INTRAMUSCULAR | Status: AC
  Filled 2024-04-23: qty 30

## 2024-04-23 MED ORDER — FENTANYL CITRATE (PF) 100 MCG/2ML IJ SOLN
INTRAMUSCULAR | Status: DC | PRN
  Administered 2024-04-23: 25 ug via INTRAVENOUS

## 2024-04-23 MED ORDER — FENTANYL CITRATE (PF) 100 MCG/2ML IJ SOLN
INTRAMUSCULAR | Status: AC
  Filled 2024-04-23: qty 2

## 2024-04-23 MED ORDER — INSULIN NPH (HUMAN) (ISOPHANE) 100 UNIT/ML ~~LOC~~ SUSP
11.0000 [IU] | Freq: Once | SUBCUTANEOUS | Status: AC
  Administered 2024-04-23: 11 [IU] via SUBCUTANEOUS
  Filled 2024-04-23: qty 10

## 2024-04-23 MED ORDER — PROPOFOL 10 MG/ML IV BOLUS
INTRAVENOUS | Status: DC | PRN
  Administered 2024-04-23: 200 mg via INTRAVENOUS

## 2024-04-23 SURGICAL SUPPLY — 45 items
BAG COUNTER SPONGE SURGICOUNT (BAG) IMPLANT
BLADE MED AGGRESSIVE (BLADE) IMPLANT
BLADE OSC/SAGITTAL 5.5X25 (BLADE) ×1 IMPLANT
BNDG COHESIVE 4X5 TAN STRL LF (GAUZE/BANDAGES/DRESSINGS) ×1 IMPLANT
BNDG ELASTIC 4X5.8 VLCR NS LF (GAUZE/BANDAGES/DRESSINGS) ×1 IMPLANT
BNDG ESMARCH 4X12 STRL LF (GAUZE/BANDAGES/DRESSINGS) ×1 IMPLANT
BNDG GAUZE DERMACEA FLUFF 4 (GAUZE/BANDAGES/DRESSINGS) ×1 IMPLANT
BNDG STRETCH 4X75 STRL LF (GAUZE/BANDAGES/DRESSINGS) ×1 IMPLANT
CNTNR URN SCR LID CUP LEK RST (MISCELLANEOUS) ×1 IMPLANT
CUFF TOURN SGL QUICK 12 (TOURNIQUET CUFF) IMPLANT
CUFF TOURN SGL QUICK 18X4 (TOURNIQUET CUFF) IMPLANT
DRAIN PENROSE 12X.25 LTX STRL (MISCELLANEOUS) IMPLANT
DRAPE FLUOR MINI C-ARM 54X84 (DRAPES) IMPLANT
DURAPREP 26ML APPLICATOR (WOUND CARE) ×1 IMPLANT
ELECTRODE REM PT RTRN 9FT ADLT (ELECTROSURGICAL) ×1 IMPLANT
GAUZE SPONGE 4X4 12PLY STRL (GAUZE/BANDAGES/DRESSINGS) ×2 IMPLANT
GAUZE XEROFORM 1X8 LF (GAUZE/BANDAGES/DRESSINGS) ×1 IMPLANT
GLOVE BIO SURGEON STRL SZ7.5 (GLOVE) ×1 IMPLANT
GLOVE INDICATOR 8.0 STRL GRN (GLOVE) ×1 IMPLANT
GOWN STRL REUS W/ TWL XL LVL3 (GOWN DISPOSABLE) ×1 IMPLANT
GOWN STRL REUS W/TWL XL LVL4 (GOWN DISPOSABLE) ×1 IMPLANT
HANDLE YANKAUER SUCT BULB TIP (MISCELLANEOUS) IMPLANT
HANDPIECE VERSAJET DEBRIDEMENT (MISCELLANEOUS) IMPLANT
KIT STIMULAN RAPID CURE 5CC (Orthopedic Implant) IMPLANT
KIT TURNOVER KIT A (KITS) ×1 IMPLANT
LABEL OR SOLS (LABEL) IMPLANT
MANIFOLD NEPTUNE II (INSTRUMENTS) ×1 IMPLANT
NDL BIOPSY JAMSHIDI 11X6 (NEEDLE) IMPLANT
NDL HYPO 25X1 1.5 SAFETY (NEEDLE) ×1 IMPLANT
NDL SAFETY ECLIPSE 18X1.5 (NEEDLE) ×1 IMPLANT
NEEDLE BIOPSY JAMSHIDI 11X6 (NEEDLE) ×1 IMPLANT
NEEDLE HYPO 25X1 1.5 SAFETY (NEEDLE) ×1 IMPLANT
NS IRRIG 500ML POUR BTL (IV SOLUTION) ×1 IMPLANT
PACK EXTREMITY ARMC (MISCELLANEOUS) ×1 IMPLANT
PAD ABD DERMACEA PRESS 5X9 (GAUZE/BANDAGES/DRESSINGS) ×2 IMPLANT
PENCIL SMOKE EVACUATOR (MISCELLANEOUS) ×1 IMPLANT
POWDER SURGICEL 3.0 GRAM (HEMOSTASIS) IMPLANT
SOLUTION PREP PVP 2OZ (MISCELLANEOUS) ×1 IMPLANT
SPONGE T-LAP 18X18 ~~LOC~~+RFID (SPONGE) ×1 IMPLANT
STOCKINETTE M/LG 89821 (MISCELLANEOUS) ×1 IMPLANT
SURGIFLO W/THROMBIN 8M KIT (HEMOSTASIS) IMPLANT
SUTURE EHLN 3-0 FS-10 30 BLK (SUTURE) ×1 IMPLANT
SYR 10ML LL (SYRINGE) ×2 IMPLANT
TRAP FLUID SMOKE EVACUATOR (MISCELLANEOUS) ×1 IMPLANT
WATER STERILE IRR 500ML POUR (IV SOLUTION) ×1 IMPLANT

## 2024-04-23 NOTE — Progress Notes (Signed)
 [facility] Progress Note  Patient: [name]    WUJ:[id number]  DOA: 04/17/2024     Date of Service: the patient was seen and examined on 04/23/2024  Chief Complaint  Patient presents with   Foot Pain   Brief hospital course: Kandon Hosking is a 58 y.o. male with medical history significant of hypertension, hyperlipidemia, diabetes mellitus, CKD-3A, former smoker, obesity, diabetic foot ulcer, who presents with right foot wound and right foot pain.   Patient was recently hospitalized from 4/22 - 4/25 due to diabetic foot infection with gangrenous change. Patient is s/p of right greater toe first ray amputation. Pt has been on Cipro  as cultures from 5/1 showed Pseudomonas, but his wound is getting worse, now has dehiscence of wound.  Patient has further pain which is constant, 7 out of 10 in severity, aching, nonradiating.  No fever or chills.  Patient does not have chest pain, cough, SOB.  No nausea, vomiting, diarrhea or abdominal pain.  No symptoms of UTI.    Patient was seen by Dr. Francee Inch of ID today. She recommended patient to go to ED for MRI, IV antibiotic, Wound bedridment and cultures.  Dr. Fawn Hooks tooks many pictures of both feet (please see the picture in Dr. Sherryle Don note).   Data reviewed independently and ED Course: pt was found to have WBC 10.2, GFR> 60, lactic acid 0.9 --> 0.8, temperature normal, blood pressure 134/84, heart rate 100, RR 18, oxygen  saturation 97% on room air.  Patient is admitted to MedSurg bed as inpatient.  Dr. Althea Atkinson of podiatry is consulted by EDP.  MRI reviewed: Abscess and gas formation, possible osteomyelitis. second metatarsal, second proximal phalanx, and the base of second middle phalanx, concerning for osteomyelitis. base of third proximal phalanx and mild marrow edema of the head of third metatarsal, could reflect reactive marrow changes, however, osteomyelitis can not be excluded. Diffuse edema of the intrinsic musculature of the  foot, concerning for myositis.   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  Status post I&D 5/16.  Bone cultures taken at that time appear to be growing bacteria, susceptibilities pending.  Appreciate podiatry and infectious disease - continue  Zosyn , changed linezolid , to daptomycin  on 5/19 as per ID 5/20 DC'd Dapto, started meropenem  - PRN Zofran  for nausea, and Tylenol , morphine  and Percocet for pain - Blood cultures NGTD, ESR 109 and CRP 12.8 elevated  - wound care consulted 5/16 tissue culture from the OR is growing MRSA, Klebsiella (ESBL) and Pseudomonas. Blood culture grew MRSA, repeat blood culture negative. TTE negative for vegetations, LVEF 65 to 70%, grade 1 diastolic dysfunction. Follow ID for antibiotics and duration of treatment and discharge planning. Patient is n.p.o. since midnight, scheduled for TMA today by podiatry   # Hypertension associated with diabetes, patient is not taking amlodipine . Would likely benefit from ARB given CKD  # HLD (hyperlipidemia): Lipitor   # Chronic kidney disease, stage 3a: Renal function stable.     # Type II diabetes mellitus with renal manifestations: Poorly controlled. A1c 3 weeks ago 13.1.  Patient taking metformin  and NPH insulin  22 units twice daily -Increase sliding scale and increase NPH to 22u BID.     # Vitamin D  deficiency: started vitamin D  50,000 units p.o. weekly, follow with PCP to repeat vitamin D  level after 3 to 6 months.  # Vitamin B12 level  287, goal >400, started vitamin B12 1000 mcg IM injection during hospital stay followed by oral supplement.  # Iron  deficiency anemia, transferrin saturation 9% Continue oral iron  supplementation.  Had colonoscopy in 2024 but had a poor prep.  He is to have a colonoscopy repeated this year, 2025 Follow-up with PCP to repeat iron  profile  after 3 to 6 months Folate level within normal range. Low vitamin B12 as above  # Constipation, started laxatives.  Obesity class I Body mass index is 32.49 kg/m.  Interventions: Calorie restricted diet and daily exercise advised to lose body weight.  Lifestyle modification discussed.   Diet: Carb modified diet, low potassium diet DVT Prophylaxis: Subcutaneous Heparin     Advance goals of care discussion: Full code  Family Communication:  Discussed plan with patient and he is understanding.  Disposition:  Pt is from Home, admitted with Right foot abscess, DM foot infection and osteomyelitis, still on IV abx, may require further procedures  Discharge to Home, when stable and cleared by podiatry and ID.  Patient may need few days to improve.  Subjective: No significant events overnight, patient had pain in the morning 8/10, which improved to 2/10 after pain medication.  Patient was awaiting for surgery, n.p.o. since midnight.  Denied any other complaints.   Physical Exam:  Physical Exam  Constitutional: In no distress.  Cardiovascular: Normal rate, regular rhythm. No lower extremity edema  Pulmonary: Non labored breathing on room air, no wheezing or rales.   Abdominal: Soft. Normal bowel sounds. Non distended and non tender Neurological: Alert and oriented to person, place, and time. Non focal  Skin: Skin is warm and dry. Feet bandaged, c/d/I.    Vitals:   04/22/24 2007 04/23/24 0340 04/23/24 0828 04/23/24 1124  BP: (!) 147/87 (!) 142/84 139/79 (!) 140/75  Pulse: 90 97 89 84  Resp: 16 18 16 17   Temp: 98.4 F (36.9 C) 98.1 F (36.7 C) 98.5 F (36.9 C)   TempSrc:      SpO2: 99% 95% 99% 95%  Weight:      Height:        Intake/Output Summary (Last 24 hours) at 04/23/2024 1309 Last data filed at 04/23/2024 9604 Gross per 24 hour  Intake 370 ml  Output --  Net 370 ml   Filed Weights   04/17/24 1255 04/18/24 1443  Weight: 99.8 kg 99.8 kg    Data  Reviewed:   CBC: Recent Labs  Lab 04/17/24 1302 04/18/24 0537 04/19/24 0324 04/20/24 0432 04/21/24 0222 04/22/24 0447 04/23/24 0407  WBC 10.2   < > 9.3 12.2* 8.9 9.0 10.2  NEUTROABS 7.8*  --   --   --   --   --   --   HGB 9.0*   < > 8.8* 8.6* 8.4* 8.2* 8.5*  HCT 29.2*   < > 28.3* 27.8* 27.9* 26.2* 27.5*  MCV 82.0   < > 81.3 81.3 83.0 81.4 81.8  PLT 383   < > 417* 442* 424* 448* 467*   < > = values in this interval not displayed.   Basic Metabolic Panel: Recent Labs  Lab 04/19/24 0324 04/20/24 0432 04/21/24 0222 04/22/24 0447 04/23/24 0407  NA 129* 133* 135 135 137  K 5.1 4.5 4.2 4.3 4.8  CL 97* 99 102 104 103  CO2 24 27 28 26 28   GLUCOSE 375* 344* 225* 156* 273*  BUN 21* 18 17 17 17   CREATININE 1.56* 1.38* 1.33* 1.34* 1.39*  CALCIUM  8.0* 8.1* 8.0*  8.0* 8.3*  MG 1.9 2.1 1.8  --   --   PHOS 2.2* 2.5 3.0  --   --     Studies: DG MINI C-ARM IMAGE ONLY Result Date: 04/23/2024 There is no interpretation for this exam.  This order is for images obtained during a surgical procedure.  Please See "Surgeries" Tab for more information regarding the procedure.   ECHOCARDIOGRAM COMPLETE Result Date: 04/23/2024    ECHOCARDIOGRAM REPORT   Patient Name:   ZAEL SHUMAN Date of Exam: 04/22/2024 Medical Rec #:  [id number]  Height:       69.0 in Accession #:    [id number] Weight:       220.0 lb Date of Birth:  1966/07/12  BSA:          2.151 m Patient Age:    58 years   BP:           123/78 mmHg Patient Gender: M          HR:           89 bpm. Exam Location:  [facility] Procedure: 2D Echo, Cardiac Doppler and Color Doppler (Both Spectral and Color            Flow Doppler were utilized during procedure). Indications:     R78.81 Bacteremia  History:         Patient has no prior history of Echocardiogram examinations.                  Risk Factors:Diabetes.  Sonographer:     Brigid Canada RDCS Referring Phys:  [id number] Alica Inks Diagnosing Phys: Sammy Crisp MD IMPRESSIONS  1. Left  ventricular ejection fraction, by estimation, is 65 to 70%. The left ventricle has normal function. The left ventricle has no regional wall motion abnormalities. There is mild left ventricular hypertrophy. Left ventricular diastolic parameters are consistent with Grade I diastolic dysfunction (impaired relaxation).  2. Right ventricular systolic function is normal. The right ventricular size is normal.  3. The mitral valve is normal in structure. Trivial mitral valve regurgitation. No evidence of mitral stenosis.  4. The aortic valve is tricuspid. Aortic valve regurgitation is not visualized. No aortic stenosis is present.  5. There is borderline dilatation of the ascending aorta, measuring 37 mm.  6. The inferior vena cava is normal in size with greater than 50% respiratory variability, suggesting right atrial pressure of 3 mmHg. Conclusion(s)/Recommendation(s): No evidence of valvular vegetations on this transthoracic echocardiogram. Consider a transesophageal echocardiogram to exclude infective endocarditis if clinically indicated. FINDINGS  Left Ventricle: Left ventricular ejection fraction, by estimation, is 65 to 70%. The left ventricle has normal function. The left ventricle has no regional wall motion abnormalities. The left ventricular internal cavity size was normal in size. There is  mild left ventricular hypertrophy. Left ventricular diastolic parameters are consistent with Grade I diastolic dysfunction (impaired relaxation). Right Ventricle: The right ventricular size is normal. No increase in right ventricular wall thickness. Right ventricular systolic function is normal. Left Atrium: Left atrial size was normal in size. Right Atrium: Right atrial size was normal in size. Pericardium: There is no evidence of pericardial effusion. Mitral Valve: The mitral valve is normal in structure. Trivial mitral valve regurgitation. No evidence of mitral valve stenosis. Tricuspid Valve: The tricuspid valve is normal  in structure. Tricuspid valve regurgitation is trivial. Aortic Valve: The aortic valve is tricuspid. Aortic valve regurgitation is not visualized. No aortic stenosis is present. Aortic valve mean gradient measures 4.7  mmHg. Aortic valve peak gradient measures 8.7 mmHg. Aortic valve area, by VTI measures 2.62 cm. Pulmonic Valve: The pulmonic valve was normal in structure. Pulmonic valve regurgitation is trivial. No evidence of pulmonic stenosis. Aorta: The aortic root is normal in size and structure. There is borderline dilatation of the ascending aorta, measuring 37 mm. Pulmonary Artery: The pulmonary artery is of normal size. Venous: The inferior vena cava is normal in size with greater than 50% respiratory variability, suggesting right atrial pressure of 3 mmHg. IAS/Shunts: The interatrial septum was not well visualized.  LEFT VENTRICLE PLAX 2D LVIDd:         5.00 cm   Diastology LVIDs:         2.60 cm   LV e' medial:    7.40 cm/s LV PW:         1.30 cm   LV E/e' medial:  11.9 LV IVS:        1.30 cm   LV e' lateral:   8.20 cm/s LVOT diam:     2.20 cm   LV E/e' lateral: 10.8 LV SV:         73 LV SV Index:   34 LVOT Area:     3.80 cm  RIGHT VENTRICLE             IVC RV Basal diam:  3.20 cm     IVC diam: 1.30 cm RV S prime:     19.93 cm/s TAPSE (M-mode): 2.8 cm LEFT ATRIUM             Index        RIGHT ATRIUM           Index LA diam:        4.20 cm 1.95 cm/m   RA Area:     13.70 cm LA Vol (A2C):   73.7 ml 34.26 ml/m  RA Volume:   29.40 ml  13.67 ml/m LA Vol (A4C):   56.6 ml 26.31 ml/m LA Biplane Vol: 69.2 ml 32.17 ml/m  AORTIC VALVE AV Area (Vmax):    2.72 cm AV Area (Vmean):   2.69 cm AV Area (VTI):     2.62 cm AV Vmax:           147.58 cm/s AV Vmean:          102.300 cm/s AV VTI:            0.277 m AV Peak Grad:      8.7 mmHg AV Mean Grad:      4.7 mmHg LVOT Vmax:         105.67 cm/s LVOT Vmean:        72.367 cm/s LVOT VTI:          0.191 m LVOT/AV VTI ratio: 0.69  AORTA Ao Root diam: 3.80 cm Ao Asc  diam:  3.70 cm MITRAL VALVE MV Area (PHT): 3.81 cm     SHUNTS MV Decel Time: 199 msec     Systemic VTI:  0.19 m MV E velocity: 88.30 cm/s   Systemic Diam: 2.20 cm MV A velocity: 118.00 cm/s MV E/A ratio:  0.75 Veryl Gottron End MD Electronically signed by Sammy Crisp MD Signature Date/Time: 04/23/2024/7:00:22 AM    Final    DG Foot Complete Right Result Date: 04/22/2024 CLINICAL DATA:  Right foot infection EXAM: RIGHT FOOT COMPLETE - 3+ VIEW COMPARISON:  04/17/2024 FINDINGS: Changes of amputation of the great toe at the level of the tarsal metatarsal joint. Antibiotic beads  within the overlying soft tissues. No visible bone destruction, acute fracture, subluxation or dislocation. IMPRESSION: No acute bony abnormality. Electronically Signed   By: Janeece Mechanic M.D.   On: 04/22/2024 18:04     Scheduled Meds:  [MAR Hold] vitamin C   500 mg Oral Daily   [MAR Hold] atorvastatin   20 mg Oral Daily   [MAR Hold] bisacodyl   10 mg Oral QHS   [MAR Hold] cyanocobalamin   1,000 mcg Intramuscular Q1200   Followed by   [ZOX Hold] vitamin B-12  1,000 mcg Oral Daily   [MAR Hold] gabapentin   300 mg Oral BID   [MAR Hold] heparin   5,000 Units Subcutaneous Q8H   [MAR Hold] insulin  aspart  0-15 Units Subcutaneous TID WC   [MAR Hold] insulin  aspart  0-5 Units Subcutaneous QHS   [MAR Hold] insulin  NPH Human  22 Units Subcutaneous BID AC & HS   [MAR Hold] iron  polysaccharides  150 mg Oral Daily   [MAR Hold] losartan  25 mg Oral Daily   [MAR Hold] polyethylene glycol  17 g Oral BID   povidone-iodine   2 Application Topical Once   [MAR Hold] Vitamin D  (Ergocalciferol )  50,000 Units Oral Q7 days   Continuous Infusions:  [MAR Hold] DAPTOmycin  Stopped (04/22/24 1405)   [MAR Hold] meropenem (MERREM) IV 1 g (04/23/24 0623)   PRN Meds: [MAR Hold] acetaminophen , [MAR Hold] bisacodyl , bupivacaine (PF), [MAR Hold] hydrALAZINE , [MAR Hold]  morphine  injection, [MAR Hold] ondansetron  (ZOFRAN ) IV, [MAR Hold]  oxyCODONE -acetaminophen , vancomycin   Time spent: 35 minutes  Author: Joette Mustard. MD [facility] 04/23/2024 1:09 PM  To reach On-call, see care teams to locate the attending and reach out to them via [email or web address]. If 7PM-7AM, please contact night-coverage If you still have difficulty reaching the attending provider, please page the [facility] (Director on Call) for [facility] on amion for assistance.

## 2024-04-23 NOTE — Anesthesia Preprocedure Evaluation (Signed)
 Anesthesia Evaluation  Patient identified by MRN, date of birth, ID band Patient awake    Reviewed: Allergy & Precautions, NPO status , Patient's Chart, lab work & pertinent test results  History of Anesthesia Complications Negative for: history of anesthetic complications  Airway Mallampati: III  TM Distance: >3 FB Neck ROM: full    Dental  (+) Chipped, Dental Advidsory Given   Pulmonary neg shortness of breath, former smoker   Pulmonary exam normal        Cardiovascular Exercise Tolerance: Good hypertension, (-) angina Normal cardiovascular exam     Neuro/Psych negative neurological ROS  negative psych ROS   GI/Hepatic negative GI ROS, Neg liver ROS,,,  Endo/Other  diabetes, Type 2    Renal/GU Renal disease     Musculoskeletal   Abdominal   Peds  Hematology negative hematology ROS (+)   Anesthesia Other Findings Past Medical History: No date: Diabetes mellitus without complication (HCC) No date: Tuberculosis     Comment:  tested positive,treated with medications.  Past Surgical History: 03/26/2024: AMPUTATION TOE; Right     Comment:  Procedure: AMPUTATION, TOE;  Surgeon: Timothy Ford,               MD;  Location: [facility] OR;  Service: Orthopedics;  Laterality:              Right;  RIGHT GREAT TOE AMPUTATION No date: FOOT SURGERY; Left 05/05/2022: I & D EXTREMITY; Left     Comment:  Procedure: LEFT FOOT DEBRIDEMENT;  Surgeon: Timothy Ford, MD;  Location: [facility] OR;  Service: Orthopedics;                Laterality: Left; 07/20/2022: MYRINGOTOMY WITH TUBE PLACEMENT; Bilateral     Comment:  Procedure: MYRINGOTOMY WITH TUBE PLACEMENT;  Surgeon:               Juengel, Paul, MD;  Location: [facility];                Service: ENT;  Laterality: Bilateral;  Diabetic No date: TONSILLECTOMY  BMI    Body Mass Index: 32.49 kg/m      Reproductive/Obstetrics negative OB ROS                              Anesthesia Physical Anesthesia Plan  ASA: 3  Anesthesia Plan: General   Post-op Pain Management:    Induction: Intravenous  PONV Risk Score and Plan: 2 and Ondansetron , Dexamethasone  and Midazolam   Airway Management Planned: LMA  Additional Equipment:   Intra-op Plan:   Post-operative Plan: Extubation in OR  Informed Consent: I have reviewed the patients History and Physical, chart, labs and discussed the procedure including the risks, benefits and alternatives for the proposed anesthesia with the patient or authorized representative who has indicated his/her understanding and acceptance.     Dental Advisory Given  Plan Discussed with: Anesthesiologist, CRNA and Surgeon  Anesthesia Plan Comments: (Patient consented for risks of anesthesia including but not limited to:  - adverse reactions to medications - damage to eyes, teeth, lips or other oral mucosa - nerve damage due to positioning  - sore throat or hoarseness - Damage to heart, brain, nerves, lungs, other parts of body or loss of life  Patient voiced understanding and assent.)       Anesthesia Quick Evaluation

## 2024-04-23 NOTE — Transfer of Care (Signed)
 Immediate Anesthesia Transfer of Care Note  Patient: [name]  Procedure(s) Performed: AMPUTATION, FOOT, TRANSMETATARSAL (Right: Toe)  Patient Location: PACU  Anesthesia Type:General  Level of Consciousness: drowsy  Airway & Oxygen  Therapy: Patient Spontanous Breathing and Patient connected to face mask oxygen   Post-op Assessment: Report given to RN, Post -op Vital signs reviewed and stable, and Patient moving all extremities  Post vital signs: Reviewed and stable  Last Vitals:  Vitals Value Taken Time  BP 104/65 04/23/24 1408  Temp 36.5 C 04/23/24 1408  Pulse 85 04/23/24 1417  Resp 9 04/23/24 1417  SpO2 100 % 04/23/24 1417  Vitals shown include unfiled device data.  Last Pain:  Vitals:   04/23/24 1124  TempSrc:   PainSc: 2       Patients Stated Pain Goal: 0 (04/18/24 2016)  Complications: No notable events documented.

## 2024-04-23 NOTE — Op Note (Signed)
 Operative note   Surgeon:Dalasia Predmore [employer] logistics/support/administrative officer: None    Preop diagnosis: Osteomyelitis right forefoot    Postop diagnosis: Same    Procedure: 1.  Partial second ray amputation right foot 2.  Partial third ray amputation right foot 3.  Partial fourth ray amputation right foot 4.  Partial fifth ray amputation right foot 5.  Bone biopsy medial cuneiform right foot 6.  Implantation antibiotic beads with vancomycin  right foot 7.  Intraoperative fluoroscopy without assistance of radiologist    EBL: 50 mL    Anesthesia:local and general.  Local consist of a total of 20 cc of 0.5% plain bupivacaine  and an ankle block fashion    Hemostasis: Ankle tourniquet inflated to 200 mmHg for 29 minutes    Specimen: 1.  Right forefoot amputation with proximal margin marked with ink.  2.  Bone biopsy medial cuneiform right foot    Complications: None    Operative indications:Kvion Beier is an 58 y.o. that presents today for surgical intervention.  The risks/benefits/alternatives/complications have been discussed and consent has been given.    Procedure:  Patient was brought into the OR and placed on the operating table in thesupine position. After anesthesia was obtained theright lower extremity was prepped and draped in usual sterile fashion.  Attention was directed to the right foot where the previous noted entire first ray amputation had been performed with an open wound dehiscence with nonviable tissue.  Intraoperative fluoroscopy was used to outline the forefoot amputation site.  At this time a obvious pathological fracture of the second metatarsal at the surgical neck was noted.  Along the midshaft this was then mapped out.  Dorsal and plantar skin flaps were created.  Osteotomy was created to the midshaft of the 2nd, 3rd, 4th and 5th metatarsals.  At this time the second ray third ray fourth ray and fifth rays were removed along the midshaft of the metatarsals.  The tissue was sent for pathological  examination and the proximal margin of the second metatarsal was marked with ink.  This time the wound was then flushed with copious amounts of irrigation.  Debridement of all of the nonviable necrotic tissue was then performed.  Debridement was performed with a Versajet throughout the entire area.  Next a Jamshidi needle was used for biopsy of the medial cuneiform and this was sent for pathological examination as well.  Final irrigation was performed.  The tourniquet was released.  Bleeders were Bovie cauterized.  Further hemostasis was achieved with Surgiflo with topical thrombin as well as Surgicel powder.  This time closure was then performed after implantation of vancomycin  impregnated beads.  Closure performed with a 3-0 nylon diffusely through the forefoot.  Remodeling of the skin flap was performed to obtain final closure.  A large bulky sterile dressing was applied.  Final fluoroscopy revealed excision of the lesser metatarsal and lesser rays to the midshaft as well as the previous performed entire first ray amputation to the cuneiform.    Patient tolerated the procedure and anesthesia well.  Was transported from the OR to the PACU with all vital signs stable and vascular status intact. To be discharged per routine protocol.

## 2024-04-24 ENCOUNTER — Encounter: Payer: Self-pay | Admitting: Podiatry

## 2024-04-24 DIAGNOSIS — R7881 Bacteremia: Secondary | ICD-10-CM | POA: Diagnosis not present

## 2024-04-24 DIAGNOSIS — B9562 Methicillin resistant Staphylococcus aureus infection as the cause of diseases classified elsewhere: Secondary | ICD-10-CM | POA: Diagnosis not present

## 2024-04-24 DIAGNOSIS — L089 Local infection of the skin and subcutaneous tissue, unspecified: Secondary | ICD-10-CM | POA: Diagnosis not present

## 2024-04-24 DIAGNOSIS — Z794 Long term (current) use of insulin: Secondary | ICD-10-CM

## 2024-04-24 DIAGNOSIS — B965 Pseudomonas (aeruginosa) (mallei) (pseudomallei) as the cause of diseases classified elsewhere: Secondary | ICD-10-CM | POA: Diagnosis not present

## 2024-04-24 DIAGNOSIS — B961 Klebsiella pneumoniae [K. pneumoniae] as the cause of diseases classified elsewhere: Secondary | ICD-10-CM

## 2024-04-24 DIAGNOSIS — E11628 Type 2 diabetes mellitus with other skin complications: Secondary | ICD-10-CM | POA: Diagnosis not present

## 2024-04-24 DIAGNOSIS — Z1612 Extended spectrum beta lactamase (ESBL) resistance: Secondary | ICD-10-CM

## 2024-04-24 LAB — BASIC METABOLIC PANEL WITH GFR
Anion gap: 4 — ABNORMAL LOW (ref 5–15)
BUN: 14 mg/dL (ref 6–20)
CO2: 28 mmol/L (ref 22–32)
Calcium: 8.2 mg/dL — ABNORMAL LOW (ref 8.9–10.3)
Chloride: 105 mmol/L (ref 98–111)
Creatinine, Ser: 1.22 mg/dL (ref 0.61–1.24)
GFR, Estimated: >60 mL/min (ref >60)
Glucose, Bld: 285 mg/dL — ABNORMAL HIGH (ref 70–99)
Potassium: 4.5 mmol/L (ref 3.5–5.1)
Sodium: 137 mmol/L (ref 135–145)

## 2024-04-24 LAB — CBC
HCT: 25 % — ABNORMAL LOW (ref 39.0–52.0)
Hemoglobin: 7.8 g/dL — ABNORMAL LOW (ref 13.0–17.0)
MCH: 25.2 pg — ABNORMAL LOW (ref 26.0–34.0)
MCHC: 31.2 g/dL (ref 30.0–36.0)
MCV: 80.9 fL (ref 80.0–100.0)
Platelets: 421 10*3/uL — ABNORMAL HIGH (ref 150–400)
RBC: 3.09 MIL/uL — ABNORMAL LOW (ref 4.22–5.81)
RDW: 14.5 % (ref 11.5–15.5)
WBC: 11.6 10*3/uL — ABNORMAL HIGH (ref 4.0–10.5)
nRBC: 0.2 % (ref 0.0–0.2)

## 2024-04-24 LAB — GLUCOSE, CAPILLARY
Glucose-Capillary: 291 mg/dL — ABNORMAL HIGH (ref 70–99)
Glucose-Capillary: 154 mg/dL — ABNORMAL HIGH (ref 70–99)
Glucose-Capillary: 181 mg/dL — ABNORMAL HIGH (ref 70–99)
Glucose-Capillary: 246 mg/dL — ABNORMAL HIGH (ref 70–99)

## 2024-04-24 MED ORDER — MUPIROCIN 2 % EX OINT
1.0000 | TOPICAL_OINTMENT | Freq: Two times a day (BID) | CUTANEOUS | Status: AC
  Administered 2024-04-24 – 2024-04-28 (×9): 1 via NASAL
  Filled 2024-04-24: qty 22

## 2024-04-24 MED ORDER — CHLORHEXIDINE GLUCONATE CLOTH 2 % EX PADS
6.0000 | MEDICATED_PAD | Freq: Every day | CUTANEOUS | Status: AC
  Administered 2024-04-24 – 2024-04-27 (×4): 6 via TOPICAL

## 2024-04-24 NOTE — Progress Notes (Addendum)
 Date of Admission:  04/17/2024      ID: [name] is a 58 y.o. male Principal Problem:   Diabetic foot infection (HCC) Active Problems:   Obesity (BMI 30-39.9)   Hypertension associated with diabetes (HCC)   HLD (hyperlipidemia)   Chronic kidney disease, stage 3a (HCC)   Type II diabetes mellitus with renal manifestations (HCC)   Normocytic anemia   MRSA bacteremia    Subjective: Pt with no complaints Doing well Had TMA yesterday  Medications:   vitamin C   500 mg Oral Daily   atorvastatin   20 mg Oral Daily   bisacodyl   10 mg Oral QHS   Chlorhexidine  Gluconate Cloth  6 each Topical Daily   cyanocobalamin   1,000 mcg Intramuscular Q1200   Followed by   [name] ON 04/25/2024] vitamin B-12  1,000 mcg Oral Daily   gabapentin   300 mg Oral BID   heparin   5,000 Units Subcutaneous Q8H   insulin  aspart  0-15 Units Subcutaneous TID WC   insulin  aspart  0-5 Units Subcutaneous QHS   insulin  NPH Human  22 Units Subcutaneous BID AC & HS   iron  polysaccharides  150 mg Oral Daily   losartan  25 mg Oral Daily   mupirocin  ointment  1 Application Nasal BID   polyethylene glycol  17 g Oral BID   Vitamin D  (Ergocalciferol )  50,000 Units Oral Q7 days    Objective: Vital signs in last 24 hours: Patient Vitals for the past 24 hrs:  BP Temp Temp src Pulse Resp SpO2  04/24/24 0933 128/74 99.1 F (37.3 C) -- 95 18 99 %  04/24/24 0409 138/75 98 F (36.7 C) -- 95 17 98 %  04/23/24 2014 (!) 142/77 98 F (36.7 C) Oral 88 17 99 %  04/23/24 1633 (!) 139/109 98 F (36.7 C) Oral 83 17 100 %  04/23/24 1550 133/85 98.1 F (36.7 C) Oral 87 16 100 %      PHYSICAL EXAM:  General: Alert, cooperative, no distress, appears stated age.  Lungs: Clear to auscultation bilaterally. No Wheezing or Rhonchi. No rales. Heart: Regular rate and rhythm, no murmur, rub or gallop. Abdomen: Soft, non-tender,not distended. Bowel sounds normal. No masses Extremities:  Right foot dressing not  removed         Left foot chronic wounds on the plantar aspect  Skin: No rashes or lesions. Or bruising Lymph: Cervical, supraclavicular normal. Neurologic: Grossly non-focal  Lab Results    Latest Ref Rng & Units 04/24/2024    4:52 AM 04/23/2024    4:07 AM 04/22/2024    4:47 AM  CBC  WBC 4.0 - 10.5 K/uL 11.6  10.2  9.0   Hemoglobin 13.0 - 17.0 g/dL 7.8  8.5  8.2   Hematocrit 39.0 - 52.0 % 25.0  27.5  26.2   Platelets 150 - 400 K/uL 421  467  448        Latest Ref Rng & Units 04/24/2024    4:52 AM 04/23/2024    4:07 AM 04/22/2024    4:47 AM  CMP  Glucose 70 - 99 mg/dL 191  478  295   BUN 6 - 20 mg/dL 14  17  17    Creatinine 0.61 - 1.24 mg/dL 6.21  3.08  6.57   Sodium 135 - 145 mmol/L 137  137  135   Potassium 3.5 - 5.1 mmol/L 4.5  4.8  4.3   Chloride 98 - 111 mmol/L 105  103  104  CO2 22 - 32 mmol/L 28  28  26    Calcium  8.9 - 10.3 mg/dL 8.2  8.3  8.0       Microbiology: Blood culture from 04/16/2024 has resulted as MRSA in 1 out of 4 bottles Surgical culture from the right foot has MRSA and Pseudomonas    Assessment/Plan: MRSA bacteremia Low bioburden The source of this is his right foot infection Diabetic foot infection  MRSA and Pseudomonas  and ESBL kleb in the culture This is the source for bacteremia Because of the chronicity of the wound at least present fo> r 2 weeks unclear how long he would have had the bacteremia but the bioburden was low 1/4 and time to positivity was 4 days and TEE reports no valvular endocarditis. The risk of endocarditis is low ( VIRSTA score is just 2 points as community acquired MRSA). 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

## 2024-04-24 NOTE — TOC Progression Note (Signed)
 Transition of Care [facility]) - Progression Note    Patient Details  Name: [name] MRN: [id number] Date of Birth: [birth year]  Transition of Care [facility]) CM/SW Contact  Alexandra Ice, RN Phone Number: 04/24/2024, 12:49 PM  Clinical Narrative:    Patient may need IV ABX at discharge, referral was sent to [facility] with [employer] for IV infusions.         Expected Discharge Plan and Services                                               Social Determinants of Health (SDOH) Interventions SDOH Screenings   Food Insecurity: No Food Insecurity (04/18/2024)  Housing: Low Risk  (04/18/2024)  Transportation Needs: No Transportation Needs (04/18/2024)  Utilities: Not At Risk (04/18/2024)  Depression (PHQ2-9): Low Risk  (08/28/2023)  Financial Resource Strain: Patient Declined (06/28/2023)   Received from [facility]  Social Connections: Unknown (05/24/2022)   [facility]  Tobacco Use: Medium Risk (04/23/2024)    Readmission Risk Interventions     No data to display

## 2024-04-24 NOTE — Anesthesia Postprocedure Evaluation (Signed)
 Anesthesia Post Note  Patient: [name]  Procedure(s) Performed: AMPUTATION, FOOT, TRANSMETATARSAL (Right: Toe)  Patient location during evaluation: PACU Anesthesia Type: General Level of consciousness: awake and alert Pain management: pain level controlled Vital Signs Assessment: post-procedure vital signs reviewed and stable Respiratory status: spontaneous breathing, nonlabored ventilation, respiratory function stable and patient connected to nasal cannula oxygen  Cardiovascular status: blood pressure returned to baseline and stable Postop Assessment: no apparent nausea or vomiting Anesthetic complications: no  No notable events documented.   Last Vitals:  Vitals:   04/23/24 2014 04/24/24 0409  BP: (!) 142/77 138/75  Pulse: 88 95  Resp: 17 17  Temp: 36.7 C 36.7 C  SpO2: 99% 98%    Last Pain:  Vitals:   04/24/24 0527  TempSrc:   PainSc: 3                  Enrique Harvest

## 2024-04-24 NOTE — Progress Notes (Signed)
 Daily Progress Note   Subjective  - 1 Day Post-Op  Follow-up right foot debridement with excision of remaining lesser toes and metatarsals.  No complaints today.  Objective Vitals:   04/23/24 1633 04/23/24 2014 04/24/24 0409 04/24/24 0933  BP: (!) 139/109 (!) 142/77 138/75 128/74  Pulse: 83 88 95 95  Resp: 17 17 17 18   Temp: 98 F (36.7 C) 98 F (36.7 C) 98 F (36.7 C) 99.1 F (37.3 C)  TempSrc: Oral Oral    SpO2: 100% 99% 98% 99%  Weight:      Height:        Physical Exam: Incisions coapted.  Skin flaps well-perfused.  No active drainage.  Mild bleeding on the bandage as expected.  Laboratory CBC    Component Value Date/Time   WBC 11.6 (H) 04/24/2024 0452   HGB 7.8 (L) 04/24/2024 0452   HCT 25.0 (L) 04/24/2024 0452   PLT 421 (H) 04/24/2024 0452    BMET    Component Value Date/Time   NA 137 04/24/2024 0452   K 4.5 04/24/2024 0452   CL 105 04/24/2024 0452   CO2 28 04/24/2024 0452   GLUCOSE 285 (H) 04/24/2024 0452   BUN 14 04/24/2024 0452   CREATININE 1.22 04/24/2024 0452   CREATININE 1.37 (H) 06/20/2022 1047   CALCIUM  8.2 (L) 04/24/2024 0452   GFRNONAA >60 04/24/2024 0452   GFRAA >60 12/29/2019 1005    Assessment/Planning: Status post transmetatarsal amputation at this time. Diabetes with neuropathy Recent positive blood culture  No dressing applied today. I discussed with the patient to try to minimize weightbearing and use the heel only.  Otherwise he should remain in a wheelchair.  He should try to also prevent pressure from the left forefoot given the large ulceration that is noted on this side. At this point the wound looks to be stable.  Will continue to follow while in house.  Once stable from infectious disease and medicine standpoint can discharge from podiatry standpoint. I discussed with the patient dressings can be performed every 2 to 3 days.  Can paint incision with Betadine  and apply bulky gauze dressing.  Patient has performed dressing  changes in the past. Upon discharge she should follow-up with podiatry in 1 to 2 weeks.  Anell Baptist A  04/24/2024, 1:08 PM

## 2024-04-24 NOTE — Progress Notes (Signed)
 [facility] Progress Note  Patient: [name]    JXB:[id number]  DOA: 04/17/2024     Date of Service: the patient was seen and examined on 04/24/2024  Chief Complaint  Patient presents with   Foot Pain   Brief hospital course: [name] is a 58 y.o. male with medical history significant of hypertension, hyperlipidemia, diabetes mellitus, CKD-3A, former smoker, obesity, diabetic foot ulcer, who presents with right foot wound and right foot pain.   Patient was recently hospitalized from 4/22 - 4/25 due to diabetic foot infection with gangrenous change. Patient is s/p of right greater toe first ray amputation. Pt has been on Cipro  as cultures from 5/1 showed Pseudomonas, but his wound is getting worse, now has dehiscence of wound.  Patient has further pain which is constant, 7 out of 10 in severity, aching, nonradiating.  No fever or chills.  Patient does not have chest pain, cough, SOB.  No nausea, vomiting, diarrhea or abdominal pain.  No symptoms of UTI.    Patient was seen by Dr. Francee Inch of ID today. She recommended patient to go to ED for MRI, IV antibiotic, Wound bedridment and cultures.  Dr. Fawn Hooks tooks many pictures of both feet (please see the picture in Dr. Sherryle Don note).   Data reviewed independently and ED Course: pt was found to have WBC 10.2, GFR> 60, lactic acid 0.9 --> 0.8, temperature normal, blood pressure 134/84, heart rate 100, RR 18, oxygen  saturation 97% on room air.  Patient is admitted to MedSurg bed as inpatient.  Dr. Althea Atkinson of podiatry is consulted by EDP.  MRI reviewed: Abscess and gas formation, possible osteomyelitis. second metatarsal, second proximal phalanx, and the base of second middle phalanx, concerning for osteomyelitis. base of third proximal phalanx and mild marrow edema of the head of third metatarsal, could reflect reactive marrow changes, however, osteomyelitis can not be excluded. Diffuse edema of the intrinsic musculature of the  foot, concerning for myositis.   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  Status post I&D 5/16.   5/21 s/p right foot debridement with excision of remaining lesser dorsal and metatarsals, and Implantation antibiotic beads with vancomycin .   - continue  Zosyn , changed linezolid , to daptomycin  on 5/19 as per ID 5/20 DC'd Zosyn  and started meropenem  - PRN Zofran  for nausea, and Tylenol , morphine  and Percocet for pain - Blood cultures NGTD, ESR 109 and CRP 12.8 elevated  - wound care consulted 5/16 tissue culture from the OR is growing MRSA, Klebsiella (ESBL) and Pseudomonas. Blood culture grew MRSA, repeat blood culture negative. TTE negative for vegetations, LVEF 65 to 70%, grade 1 diastolic dysfunction. 5/22 patient was cleared by podiatry to discharge and follow-up as an outpatient Follow ID for antibiotics and duration of treatment and discharge planning. DC plan, awaiting for final recommendation from ID  # Hypertension associated with diabetes, patient was not on any medication. Started losartan 25 mg p.o. daily Monitor BP and titrate medications accordingly  # HLD (hyperlipidemia): Lipitor   # Chronic kidney disease, stage 3a: Renal function stable.     # Type II diabetes mellitus with renal manifestations: Poorly controlled. A1c 3 weeks ago 13.1.  Patient taking metformin  and NPH insulin  22 units twice daily -Increase sliding scale and increase NPH to 22u BID.     # Vitamin D  deficiency: started vitamin D  50,000 units p.o.  weekly, follow with PCP to repeat vitamin D  level after 3 to 6 months.  # Vitamin B12 level 287, goal >400, started vitamin B12 1000 mcg IM injection during hospital stay followed by oral supplement.  # Iron  deficiency anemia, transferrin saturation 9% Continue oral iron  supplementation.  Had colonoscopy in 2024  but had a poor prep.  He is to have a colonoscopy repeated this year, 2025 Follow-up with PCP to repeat iron  profile after 3 to 6 months Folate level within normal range. Low vitamin B12 as above  # Constipation, started laxatives.  Obesity class I Body mass index is 32.49 kg/m.  Interventions: Calorie restricted diet and daily exercise advised to lose body weight.  Lifestyle modification discussed.   Diet: Carb modified diet, low potassium diet DVT Prophylaxis: Subcutaneous Heparin     Advance goals of care discussion: Full code  Family Communication:  Discussed plan with patient and he is understanding.  Disposition:  Pt is from Home, admitted with Right foot abscess, DM foot infection and osteomyelitis, still on IV abx, s/p Sx done on 5/21  Discharge to Home, when cleared by ID.    Subjective: No significant events overnight, complaining of right foot pain 6/10, awaiting for next dose of pain medications.  Patient denied any other complaints.   Physical Exam:  Physical Exam  Constitutional: In no distress.  Cardiovascular: Normal rate, regular rhythm. No lower extremity edema  Pulmonary: Non labored breathing on room air, no wheezing or rales.   Abdominal: Soft. Normal bowel sounds. Non distended and non tender Neurological: Alert and oriented to person, place, and time. Non focal  Extremities: s/p right foot surgery, dressing CDI.  Vitals:   04/23/24 1633 04/23/24 2014 04/24/24 0409 04/24/24 0933  BP: (!) 139/109 (!) 142/77 138/75 128/74  Pulse: 83 88 95 95  Resp: 17 17 17 18   Temp: 98 F (36.7 C) 98 F (36.7 C) 98 F (36.7 C) 99.1 F (37.3 C)  TempSrc: Oral Oral    SpO2: 100% 99% 98% 99%  Weight:      Height:        Intake/Output Summary (Last 24 hours) at 04/24/2024 1447 Last data filed at 04/24/2024 1300 Gross per 24 hour  Intake 200 ml  Output --  Net 200 ml   Filed Weights   04/17/24 1255 04/18/24 1443  Weight: 99.8 kg 99.8 kg    Data  Reviewed:   CBC: Recent Labs  Lab 04/20/24 0432 04/21/24 0222 04/22/24 0447 04/23/24 0407 04/24/24 0452  WBC 12.2* 8.9 9.0 10.2 11.6*  HGB 8.6* 8.4* 8.2* 8.5* 7.8*  HCT 27.8* 27.9* 26.2* 27.5* 25.0*  MCV 81.3 83.0 81.4 81.8 80.9  PLT 442* 424* 448* 467* 421*   Basic Metabolic Panel: Recent Labs  Lab 04/19/24 0324 04/20/24 0432 04/21/24 0222 04/22/24 0447 04/23/24 0407 04/24/24 0452  NA 129* 133* 135 135 137 137  K 5.1 4.5 4.2 4.3 4.8 4.5  CL 97* 99 102 104 103 105  CO2 24 27 28 26 28 28   GLUCOSE 375* 344* 225* 156* 273* 285*  BUN 21* 18 17 17 17 14   CREATININE 1.56* 1.38* 1.33* 1.34* 1.39* 1.22  CALCIUM  8.0* 8.1* 8.0* 8.0* 8.3* 8.2*  MG 1.9 2.1 1.8  --   --   --   PHOS 2.2* 2.5 3.0  --   --   --     Studies: No results found.    Scheduled Meds:  vitamin C   500 mg Oral Daily  atorvastatin   20 mg Oral Daily   bisacodyl   10 mg Oral QHS   Chlorhexidine  Gluconate Cloth  6 each Topical Daily   cyanocobalamin   1,000 mcg Intramuscular Q1200   Followed by   [name] ON 04/25/2024] vitamin B-12  1,000 mcg Oral Daily   gabapentin   300 mg Oral BID   heparin   5,000 Units Subcutaneous Q8H   insulin  aspart  0-15 Units Subcutaneous TID WC   insulin  aspart  0-5 Units Subcutaneous QHS   insulin  NPH Human  22 Units Subcutaneous BID AC & HS   iron  polysaccharides  150 mg Oral Daily   losartan  25 mg Oral Daily   mupirocin  ointment  1 Application Nasal BID   polyethylene glycol  17 g Oral BID   Vitamin D  (Ergocalciferol )  50,000 Units Oral Q7 days   Continuous Infusions:  DAPTOmycin  Stopped (04/22/24 1405)   meropenem (MERREM) IV 1 g (04/24/24 1408)   PRN Meds: acetaminophen , bisacodyl , hydrALAZINE , morphine  injection, ondansetron  (ZOFRAN ) IV, oxyCODONE -acetaminophen   Time spent: 35 minutes  Author: Joette Mustard. MD [facility] 04/24/2024 2:47 PM  To reach On-call, see care teams to locate the attending and reach out to them via [email or web address]. If 7PM-7AM,  please contact night-coverage If you still have difficulty reaching the attending provider, please page the [facility] (Director on Call) for [facility] on amion for assistance.

## 2024-04-24 NOTE — Evaluation (Signed)
 Physical Therapy Evaluation Patient Details Name: Elizeo Rodriques MRN: [id number] DOB: 23-Apr-1966 Today's Date: 04/24/2024  History of Present Illness  Pt admitted for diabetic foor infections and is s/p TMA on 04/23/24. History includes HTN, HLD, DM, CKD and was recently hospitalized from 4/22-4/25 with 1st Jossalyn Forgione amp 2 weeks ago and then had complications with further surgery warrented.  Clinical Impression  Pt is a pleasant 58 year old male who was admitted for diabetic foot infection. Pt is s/p TMA on 04/23/24. Pt demonstrates all bed mobility/transfers/ambulation at baseline level and is able to maintain correct WBing precautions. Pt does not require any further PT needs at this time. Pt will be dc in house and does not require follow up. RN aware. Will dc current orders.        If plan is discharge home, recommend the following:     Can travel by private vehicle        Equipment Recommendations None recommended by PT  Recommendations for Other Services       Functional Status Assessment Patient has not had a recent decline in their functional status     Precautions / Restrictions Precautions Precautions: Fall Recall of Precautions/Restrictions: Intact Required Braces or Orthoses: Other Brace Other Brace: post-op shoe both feet Restrictions Weight Bearing Restrictions Per Provider Order: Yes RLE Weight Bearing Per Provider Order:  (heel weight bearing)      Mobility  Bed Mobility               General bed mobility comments: not performed, received in recliner    Transfers Overall transfer level: Modified independent Equipment used: Rolling walker (2 wheels) Transfers: Sit to/from Stand Sit to Stand: Modified independent (Device/Increase time)           General transfer comment: safe technique. No concerns    Ambulation/Gait Ambulation/Gait assistance: Modified independent (Device/Increase time) Gait Distance (Feet): 40 Feet Assistive device: Rolling walker  (2 wheels) Gait Pattern/deviations: Step-to pattern       General Gait Details: ambulated with safe technique and ability for correct WBing. Educated on performing short distances  Teacher, music Rankin (Stroke Patients Only)       Balance Overall balance assessment: Needs assistance Sitting-balance support: Feet supported Sitting balance-Leahy Scale: Good     Standing balance support: Bilateral upper extremity supported Standing balance-Leahy Scale: Good                               Pertinent Vitals/Pain Pain Assessment Pain Assessment: 0-10 Pain Score: 5  Pain Location: R foot Pain Descriptors / Indicators: Sharp, Shooting, Sore, Aching Pain Intervention(s): Limited activity within patient's tolerance, Repositioned    Home Living Family/patient expects to be discharged to:: Private residence Living Arrangements: Alone Available Help at Discharge: Family Type of Home: Apartment Home Access: Level entry       Home Layout: One level Home Equipment: Shower seat;Grab bars - Building services engineer (2 wheels) Additional Comments: 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 was using AD ADLs Comments: indep     Extremity/Trunk Assessment   Upper Extremity Assessment Upper Extremity Assessment: Overall WFL for tasks assessed    Lower  Extremity Assessment Lower Extremity Assessment: Overall WFL for tasks assessed       Communication   Communication Communication: No apparent difficulties    Cognition Arousal: Alert Behavior During Therapy: WFL for tasks assessed/performed   PT - Cognitive impairments: No apparent impairments                       PT - Cognition Comments: pleasant and agreeable to evaluation Following commands: Intact        Cueing Cueing Techniques: Verbal cues     General Comments      Exercises     Assessment/Plan    PT Assessment Patient does not need any further PT services  PT Problem List         PT Treatment Interventions      PT Goals (Current goals can be found in the Care Plan section)  Acute Rehab PT Goals Patient Stated Goal: return home to independent PT Goal Formulation: All assessment and education complete, DC therapy Time For Goal Achievement: 04/24/24 Potential to Achieve Goals: Good    Frequency       Co-evaluation               AM-PAC PT "6 Clicks" Mobility  Outcome Measure Help needed turning from your back to your side while in a flat bed without using bedrails?: None Help needed moving from lying on your back to sitting on the side of a flat bed without using bedrails?: None Help needed moving to and from a bed to a chair (including a wheelchair)?: None Help needed standing up from a chair using your arms (e.g., wheelchair or bedside chair)?: None Help needed to walk in hospital room?: None Help needed climbing 3-5 steps with a railing? : A Little 6 Click Score: 23    End of Session   Activity Tolerance: Patient tolerated treatment well Patient left: in chair;with call bell/phone within reach Nurse Communication: Mobility status PT Visit Diagnosis: Other abnormalities of gait and mobility (R26.89);Difficulty in walking, not elsewhere classified (R26.2)    Time: 1610-9604 PT Time Calculation (min) (ACUTE ONLY): 11 min   Charges:   PT Evaluation $PT Eval Low Complexity: 1 Low   PT General Charges $$ ACUTE PT VISIT: 1 Visit         Amparo Balk, PT, DPT, GCS [phone]   Lakaya Tolen 04/24/2024, 10:07 AM

## 2024-04-24 NOTE — TOC Progression Note (Signed)
 Transition of Care [facility]) - Progression Note    Patient Details  Name: [name] MRN: [id number] Date of Birth: 07-19-1966  Transition of [facility] CM/SW Contact  Alexandra Ice, RN Phone Number: 04/24/2024, 8:25 AM  Clinical Narrative:      Patient planned for TMA with podiatry today, TOC will continue to monitor for potential discharge needs.       Expected Discharge Plan and Services                                               Social Determinants of Health (SDOH) Interventions SDOH Screenings   Food Insecurity: No Food Insecurity (04/18/2024)  Housing: Low Risk  (04/18/2024)  Transportation Needs: No Transportation Needs (04/18/2024)  Utilities: Not At Risk (04/18/2024)  Depression (PHQ2-9): Low Risk  (08/28/2023)  Financial Resource Strain: Patient Declined (06/28/2023)   Received from [facility]  Social Connections: Unknown (05/24/2022)   [facility]  Tobacco Use: Medium Risk (04/23/2024)    Readmission Risk Interventions     No data to display

## 2024-04-24 NOTE — Discharge Instructions (Signed)
 Right foot dressings: Change dressing every 3 to 5 days.  Paint incision with Betadine  and cover with gauze dressing.  Left foot dressings Change dressing every 3 to 5 days.  Paint wound with Betadine  and cover with bulky gauze dressing.

## 2024-04-24 NOTE — Plan of Care (Signed)

## 2024-04-25 ENCOUNTER — Ambulatory Visit (HOSPITAL_BASED_OUTPATIENT_CLINIC_OR_DEPARTMENT_OTHER): Admitting: Internal Medicine

## 2024-04-25 ENCOUNTER — Inpatient Hospital Stay
Admit: 2024-04-25 | Discharge: 2024-04-25 | Disposition: A | Discharge status: Home / Self Care | Attending: Student | Admitting: Student

## 2024-04-25 ENCOUNTER — Other Ambulatory Visit: Payer: Self-pay

## 2024-04-25 DIAGNOSIS — E11628 Type 2 diabetes mellitus with other skin complications: Secondary | ICD-10-CM | POA: Diagnosis not present

## 2024-04-25 DIAGNOSIS — B965 Pseudomonas (aeruginosa) (mallei) (pseudomallei) as the cause of diseases classified elsewhere: Secondary | ICD-10-CM | POA: Diagnosis not present

## 2024-04-25 DIAGNOSIS — B9562 Methicillin resistant Staphylococcus aureus infection as the cause of diseases classified elsewhere: Secondary | ICD-10-CM | POA: Diagnosis not present

## 2024-04-25 DIAGNOSIS — L089 Local infection of the skin and subcutaneous tissue, unspecified: Secondary | ICD-10-CM | POA: Diagnosis not present

## 2024-04-25 DIAGNOSIS — R7881 Bacteremia: Secondary | ICD-10-CM | POA: Diagnosis not present

## 2024-04-25 LAB — GLUCOSE, CAPILLARY
Glucose-Capillary: 145 mg/dL — ABNORMAL HIGH (ref 70–99)
Glucose-Capillary: 68 mg/dL — ABNORMAL LOW (ref 70–99)
Glucose-Capillary: 240 mg/dL — ABNORMAL HIGH (ref 70–99)
Glucose-Capillary: 185 mg/dL — ABNORMAL HIGH (ref 70–99)

## 2024-04-25 LAB — CBC
HCT: 25.3 % — ABNORMAL LOW (ref 39.0–52.0)
Hemoglobin: 7.8 g/dL — ABNORMAL LOW (ref 13.0–17.0)
MCH: 25.6 pg — ABNORMAL LOW (ref 26.0–34.0)
MCHC: 30.8 g/dL (ref 30.0–36.0)
MCV: 83 fL (ref 80.0–100.0)
Platelets: 365 10*3/uL (ref 150–400)
RBC: 3.05 MIL/uL — ABNORMAL LOW (ref 4.22–5.81)
RDW: 14.6 % (ref 11.5–15.5)
WBC: 12.2 10*3/uL — ABNORMAL HIGH (ref 4.0–10.5)
nRBC: 0 % (ref 0.0–0.2)

## 2024-04-25 LAB — BASIC METABOLIC PANEL WITH GFR
Anion gap: 3 — ABNORMAL LOW (ref 5–15)
BUN: 14 mg/dL (ref 6–20)
CO2: 30 mmol/L (ref 22–32)
Calcium: 8.5 mg/dL — ABNORMAL LOW (ref 8.9–10.3)
Chloride: 106 mmol/L (ref 98–111)
Creatinine, Ser: 1.22 mg/dL (ref 0.61–1.24)
GFR, Estimated: >60 mL/min (ref >60)
Glucose, Bld: 202 mg/dL — ABNORMAL HIGH (ref 70–99)
Potassium: 4.5 mmol/L (ref 3.5–5.1)
Sodium: 139 mmol/L (ref 135–145)

## 2024-04-25 NOTE — Treatment Plan (Addendum)
 Diagnosis: MRSA bacteremia Diabetic foot infection Rt foot S/p TMA  Baseline Creatinine 1.22  Culture Result: pseudomonas, esbl klebsiella and MRSA in surgical culture  No Known Allergies  OPAT Orders Discharge antibiotics: Daptomycin  8mg /kg body weight-= 700mg  IV every 24 hours  And Meropenem 1 gram Iv every 8 hours Duration: 2 weeks - may need more depending on the  pathology result End Date:05/07/24 Will need more depending on pathology   [facility] Care Per Protocol:  Labs weekly on Monday while on IV antibiotics: _X_ CBC with differential _X_  CK _X_ CMP _X_ CRP _X_ ESR    _X_ Please leave PIC in place until doctor has seen patient or been notified  Fax weekly lab results  promptly to [phone]  Clinic Follow Up Appt:05/06/24 at 10 am with [facility]   Call [phone] twith any critical value or questions

## 2024-04-25 NOTE — Plan of Care (Signed)
  Problem: Education: Goal: Knowledge of General Education information will improve Description: Including pain rating scale, medication(s)/side effects and non-pharmacologic comfort measures Outcome: Progressing   Problem: Health Behavior/Discharge Planning: Goal: Ability to manage health-related needs will improve Outcome: Progressing   Problem: Clinical Measurements: Goal: Ability to maintain clinical measurements within normal limits will improve Outcome: Progressing Goal: Cardiovascular complication will be avoided Outcome: Progressing   Problem: Activity: Goal: Risk for activity intolerance will decrease Outcome: Progressing   Problem: Nutrition: Goal: Adequate nutrition will be maintained Outcome: Progressing   Problem: Coping: Goal: Level of anxiety will decrease Outcome: Progressing   Problem: Elimination: Goal: Will not experience complications related to bowel motility Outcome: Progressing Goal: Will not experience complications related to urinary retention Outcome: Progressing   Problem: Pain Managment: Goal: General experience of comfort will improve and/or be controlled Outcome: Progressing   Problem: Safety: Goal: Ability to remain free from injury will improve Outcome: Progressing   Problem: Skin Integrity: Goal: Risk for impaired skin integrity will decrease Outcome: Progressing   Problem: Clinical Measurements: Goal: Ability to avoid or minimize complications of infection will improve Outcome: Progressing   Problem: Skin Integrity: Goal: Skin integrity will improve Outcome: Progressing

## 2024-04-25 NOTE — Progress Notes (Signed)
 [facility] Progress Note  Patient: [name]    ZOX:[id number]  DOA: 04/17/2024     Date of Service: the patient was seen and examined on 04/25/2024  Chief Complaint  Patient presents with   Foot Pain   Brief hospital course: Sharief Wainwright is a 58 y.o. male with medical history significant of hypertension, hyperlipidemia, diabetes mellitus, CKD-3A, former smoker, obesity, diabetic foot ulcer, who presents with right foot wound and right foot pain.   Patient was recently hospitalized from 4/22 - 4/25 due to diabetic foot infection with gangrenous change. Patient is s/p of right greater toe first ray amputation. Pt has been on Cipro  as cultures from 5/1 showed Pseudomonas, but his wound is getting worse, now has dehiscence of wound.  Patient has further pain which is constant, 7 out of 10 in severity, aching, nonradiating.  No fever or chills.  Patient does not have chest pain, cough, SOB.  No nausea, vomiting, diarrhea or abdominal pain.  No symptoms of UTI.    Patient was seen by Dr. Francee Inch of ID today. She recommended patient to go to ED for MRI, IV antibiotic, Wound bedridment and cultures.  Dr. Fawn Hooks tooks many pictures of both feet (please see the picture in Dr. Sherryle Don note).   Data reviewed independently and ED Course: pt was found to have WBC 10.2, GFR> 60, lactic acid 0.9 --> 0.8, temperature normal, blood pressure 134/84, heart rate 100, RR 18, oxygen  saturation 97% on room air.  Patient is admitted to MedSurg bed as inpatient.  Dr. Althea Atkinson of podiatry is consulted by EDP.  MRI reviewed: Abscess and gas formation, possible osteomyelitis. second metatarsal, second proximal phalanx, and the base of second middle phalanx, concerning for osteomyelitis. base of third proximal phalanx and mild marrow edema of the head of third metatarsal, could reflect reactive marrow changes, however, osteomyelitis can not be excluded. Diffuse edema of the intrinsic musculature of the  foot, concerning for myositis.   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  Status post I&D 5/16.   5/21 s/p right foot debridement with excision of remaining lesser dorsal and metatarsals, and Implantation antibiotic beads with vancomycin .   - continue  Zosyn , changed linezolid , to daptomycin  on 5/19 as per ID 5/20 DC'd Zosyn  and started meropenem  - PRN Zofran  for nausea, and Tylenol , morphine  and Percocet for pain - Blood cultures NGTD, ESR 109 and CRP 12.8 elevated  - wound care consulted 5/16 tissue culture from the OR is growing MRSA, Klebsiella (ESBL) and Pseudomonas. Blood culture grew MRSA, repeat blood culture negative. TTE negative for vegetations, LVEF 65 to 70%, grade 1 diastolic dysfunction. 5/22 patient was cleared by podiatry to discharge and follow-up as an outpatient Patient will need IV antibiotics and PICC line Follow ID for antibiotics and duration of treatment and discharge planning.  # Hypertension associated with diabetes, patient was not on any medication. Started losartan 25 mg p.o. daily Monitor BP and titrate medications accordingly  # HLD (hyperlipidemia): Lipitor   # Chronic kidney disease, stage 3a: Renal function stable.     # Type II diabetes mellitus with renal manifestations: Poorly controlled. A1c 3 weeks ago 13.1.  Patient taking metformin  and NPH insulin  22 units twice daily -Increase sliding scale and increase NPH to 22u BID.     # Vitamin D  deficiency: started vitamin D  50,000 units p.o.  weekly, follow with PCP to repeat vitamin D  level after 3 to 6 months.  # Vitamin B12 level 287, goal >400, started vitamin B12 1000 mcg IM injection during hospital stay followed by oral supplement.  # Iron  deficiency anemia, transferrin saturation 9% Continue oral iron  supplementation.  Had colonoscopy in 2024 but  had a poor prep.  He is to have a colonoscopy repeated this year, 2025 Follow-up with PCP to repeat iron  profile after 3 to 6 months Folate level within normal range. Low vitamin B12 as above  # Constipation, started laxatives.  Obesity class I Body mass index is 32.49 kg/m.  Interventions: Calorie restricted diet and daily exercise advised to lose body weight.  Lifestyle modification discussed.   Diet: Carb modified diet, low potassium diet DVT Prophylaxis: Subcutaneous Heparin     Advance goals of care discussion: Full code  Family Communication:  Discussed plan with patient and he is understanding.  Disposition:  Pt is from Home, admitted with Right foot abscess, DM foot infection and osteomyelitis, still on IV abx, s/p Sx done on 5/21  Discharge to Home, when cleared by ID.    Subjective: No significant events overnight, foot pain is off and on, currently pain is 5/10 and patient feels better with pain medications, pain goes to 0 level.  Denied any active issues.   Physical Exam:  Physical Exam  Constitutional: In no distress.  Cardiovascular: Normal rate, regular rhythm. No lower extremity edema  Pulmonary: Non labored breathing on room air, no wheezing or rales.   Abdominal: Soft. Normal bowel sounds. Non distended and non tender Neurological: Alert and oriented to person, place, and time. Non focal  Extremities: s/p right foot surgery, dressing CDI.  Vitals:   04/24/24 1701 04/24/24 2121 04/25/24 0401 04/25/24 0803  BP: (!) 146/75 (!) 141/78 135/86 (!) 150/77  Pulse: 87 90 84 84  Resp: 19 19 17 18   Temp: 98.7 F (37.1 C) 98.5 F (36.9 C)  99 F (37.2 C)  TempSrc:      SpO2: 99% 99% 99% 99%  Weight:      Height:        Intake/Output Summary (Last 24 hours) at 04/25/2024 1337 Last data filed at 04/24/2024 1936 Gross per 24 hour  Intake 240 ml  Output --  Net 240 ml   Filed Weights   04/17/24 1255 04/18/24 1443  Weight: 99.8 kg 99.8 kg    Data  Reviewed:   CBC: Recent Labs  Lab 04/21/24 0222 04/22/24 0447 04/23/24 0407 04/24/24 0452 04/25/24 0232  WBC 8.9 9.0 10.2 11.6* 12.2*  HGB 8.4* 8.2* 8.5* 7.8* 7.8*  HCT 27.9* 26.2* 27.5* 25.0* 25.3*  MCV 83.0 81.4 81.8 80.9 83.0  PLT 424* 448* 467* 421* 365   Basic Metabolic Panel: Recent Labs  Lab 04/19/24 0324 04/20/24 0432 04/21/24 0222 04/22/24 0447 04/23/24 0407 04/24/24 0452 04/25/24 0232  NA 129* 133* 135 135 137 137 139  K 5.1 4.5 4.2 4.3 4.8 4.5 4.5  CL 97* 99 102 104 103 105 106  CO2 24 27 28 26 28 28 30   GLUCOSE 375* 344* 225* 156* 273* 285* 202*  BUN 21* 18 17 17 17 14 14   CREATININE 1.56* 1.38* 1.33* 1.34* 1.39* 1.22 1.22  CALCIUM  8.0* 8.1* 8.0* 8.0* 8.3* 8.2* 8.5*  MG 1.9 2.1 1.8  --   --   --   --   PHOS 2.2* 2.5 3.0  --   --   --   --  Studies: No results found.    Scheduled Meds:  vitamin C   500 mg Oral Daily   atorvastatin   20 mg Oral Daily   bisacodyl   10 mg Oral QHS   Chlorhexidine  Gluconate Cloth  6 each Topical Daily   vitamin B-12  1,000 mcg Oral Daily   gabapentin   300 mg Oral BID   heparin   5,000 Units Subcutaneous Q8H   insulin  aspart  0-15 Units Subcutaneous TID WC   insulin  aspart  0-5 Units Subcutaneous QHS   insulin  NPH Human  22 Units Subcutaneous BID AC & HS   iron  polysaccharides  150 mg Oral Daily   losartan  25 mg Oral Daily   mupirocin  ointment  1 Application Nasal BID   polyethylene glycol  17 g Oral BID   Vitamin D  (Ergocalciferol )  50,000 Units Oral Q7 days   Continuous Infusions:  DAPTOmycin  700 mg (04/24/24 1506)   meropenem (MERREM) IV 1 g (04/25/24 0538)   PRN Meds: acetaminophen , bisacodyl , hydrALAZINE , morphine  injection, ondansetron  (ZOFRAN ) IV, oxyCODONE -acetaminophen   Time spent: 35 minutes  Author: Joette Mustard. MD [facility] 04/25/2024 1:37 PM  To reach On-call, see care teams to locate the attending and reach out to them via [email or web address]. If 7PM-7AM, please contact  night-coverage If you still have difficulty reaching the attending provider, please page the [facility] (Director on Call) for [facility] on amion for assistance.

## 2024-04-25 NOTE — Progress Notes (Signed)
 Daily Progress Note   Subjective  - 2 Days Post-Op  Follow-up bilateral foot.  Status post basically transmetatarsal amputation on the right foot at this time.  Chronic ulcerations on the left foot.  Dressings were just changed today by both nursing and infectious disease.  Objective Vitals:   04/24/24 2121 04/25/24 0401 04/25/24 0803 04/25/24 1421  BP: (!) 141/78 135/86 (!) 150/77 (!) 149/90  Pulse: 90 84 84 98  Resp: 19 17 18 16   Temp: 98.5 F (36.9 C)  99 F (37.2 C) 98.7 F (37.1 C)  TempSrc:      SpO2: 99% 99% 99% 100%  Weight:      Height:        Physical Exam: Cultures are growing multiple organisms including Pseudomonas blood CLL and MRSA.  Laboratory CBC    Component Value Date/Time   WBC 12.2 (H) 04/25/2024 0232   HGB 7.8 (L) 04/25/2024 0232   HCT 25.3 (L) 04/25/2024 0232   PLT 365 04/25/2024 0232    BMET    Component Value Date/Time   NA 139 04/25/2024 0232   K 4.5 04/25/2024 0232   CL 106 04/25/2024 0232   CO2 30 04/25/2024 0232   GLUCOSE 202 (H) 04/25/2024 0232   BUN 14 04/25/2024 0232   CREATININE 1.22 04/25/2024 0232   CREATININE 1.37 (H) 06/20/2022 1047   CALCIUM  8.5 (L) 04/25/2024 0232   GFRNONAA >60 04/25/2024 0232   GFRAA >60 12/29/2019 1005    Assessment/Planning: Status post right foot transmetatarsal amputation at this point.  Patient had previously undergone first ray amputation.  Also biopsy of the medial cuneiform has been performed.  Patient with chronic left plantar foot ulcerations  Diabetes with neuropathy   At this time patient is stable for discharge.  Awaiting IV antibiotic.  Appreciate infectious disease input. Discussed with the patient to maintain only heel weightbearing and for very short steps no more than 1-2 steps at a time. I discussed with the patient dressing changes.  He can change this every 2 to 3 days.  Just paint the wounds with Betadine  and apply padded dressing. I will see him in outpatient clinic in about  2 weeks upon discharge. Orders for dressing changes have been placed while in house. Podiatry to follow at a distance at this time.  Please reach out if there is any concerns.  Anell Baptist A  04/25/2024, 3:25 PM

## 2024-04-25 NOTE — Plan of Care (Signed)
   Problem: Education: Goal: Ability to describe self-care measures that may prevent or decrease complications (Diabetes Survival Skills Education) will improve Outcome: Progressing   Problem: Metabolic: Goal: Ability to maintain appropriate glucose levels will improve Outcome: Progressing   Problem: Nutritional: Goal: Maintenance of adequate nutrition will improve Outcome: Progressing   Problem: Skin Integrity: Goal: Risk for impaired skin integrity will decrease Outcome: Progressing

## 2024-04-25 NOTE — Plan of Care (Signed)
  Problem: Education: Goal: Ability to describe self-care measures that may prevent or decrease complications (Diabetes Survival Skills Education) will improve Outcome: Progressing   Problem: Pain Managment: Goal: General experience of comfort will improve and/or be controlled Outcome: Progressing   Problem: Safety: Goal: Ability to remain free from injury will improve Outcome: Progressing   Problem: Skin Integrity: Goal: Risk for impaired skin integrity will decrease Outcome: Progressing

## 2024-04-25 NOTE — Progress Notes (Signed)
 PHARMACY CONSULT NOTE FOR:  OUTPATIENT  PARENTERAL ANTIBIOTIC THERAPY (OPAT)  Indication: MRSA bacteremia and polymicrobial diabetic foot infection  Regimen:  - Daptomycin  700mg  IV q24h - Meropenem 1gm IV q8h  End date: 05/07/2024  - Labs - Once weekly:  CBC/D, BMP, CPK, ESR and CRP  - Fax weekly lab results  promptly to [phone]  - Please leave PIC in place until doctor has seen patient or been notified  - Call [phone] with any critical value or questions   IV antibiotic discharge orders are pended. To discharging provider:  please sign these orders via discharge navigator,  Select New Orders & click on the button choice - Manage This Unsigned Work.     Thank you for allowing pharmacy to be a part of this patient's care.  Anahy Esh, PharmD, BCPS, BCIDP Work Cell: [phone] 04/25/2024 4:24 PM

## 2024-04-25 NOTE — Progress Notes (Addendum)
 Date of Admission:  04/17/2024      ID: [name] is a 58 y.o. male Principal Problem:   Diabetic foot infection (HCC) Active Problems:   Obesity (BMI 30-39.9)   Hypertension associated with diabetes (HCC)   HLD (hyperlipidemia)   Chronic kidney disease, stage 3a (HCC)   Type II diabetes mellitus with renal manifestations (HCC)   Normocytic anemia   MRSA bacteremia    Subjective: Pt with no complaints Doing well   Medications:   vitamin C   500 mg Oral Daily   atorvastatin   20 mg Oral Daily   bisacodyl   10 mg Oral QHS   Chlorhexidine  Gluconate Cloth  6 each Topical Daily   vitamin B-12  1,000 mcg Oral Daily   gabapentin   300 mg Oral BID   heparin   5,000 Units Subcutaneous Q8H   insulin  aspart  0-15 Units Subcutaneous TID WC   insulin  aspart  0-5 Units Subcutaneous QHS   insulin  NPH Human  22 Units Subcutaneous BID AC & HS   iron  polysaccharides  150 mg Oral Daily   losartan  25 mg Oral Daily   mupirocin  ointment  1 Application Nasal BID   polyethylene glycol  17 g Oral BID   Vitamin D  (Ergocalciferol )  50,000 Units Oral Q7 days    Objective: Vital signs in last 24 hours: Patient Vitals for the past 24 hrs:  BP Temp Pulse Resp SpO2  04/25/24 1421 (!) 149/90 98.7 F (37.1 C) 98 16 100 %  04/25/24 0803 (!) 150/77 99 F (37.2 C) 84 18 99 %  04/25/24 0401 135/86 -- 84 17 99 %  04/24/24 2121 (!) 141/78 98.5 F (36.9 C) 90 19 99 %  04/24/24 1701 (!) 146/75 98.7 F (37.1 C) 87 19 99 %      PHYSICAL EXAM:  General: Alert, cooperative, no distress, appears stated age.  Lungs: Clear to auscultation bilaterally. No Wheezing or Rhonchi. No rales. Heart: Regular rate and rhythm, no murmur, rub or gallop. Abdomen: Soft, non-tender,not distended. Bowel sounds normal. No masses Extremities:  Right foot dressing changed   04/25/24   04/17/24           Left foot chronic wounds on the plantar aspect  Skin: No rashes or lesions. Or bruising Lymph:  Cervical, supraclavicular normal. Neurologic: Grossly non-focal  Lab Results    Latest Ref Rng & Units 04/25/2024    2:32 AM 04/24/2024    4:52 AM 04/23/2024    4:07 AM  CBC  WBC 4.0 - 10.5 K/uL 12.2  11.6  10.2   Hemoglobin 13.0 - 17.0 g/dL 7.8  7.8  8.5   Hematocrit 39.0 - 52.0 % 25.3  25.0  27.5   Platelets 150 - 400 K/uL 365  421  467        Latest Ref Rng & Units 04/25/2024    2:32 AM 04/24/2024    4:52 AM 04/23/2024    4:07 AM  CMP  Glucose 70 - 99 mg/dL 914  782  956   BUN 6 - 20 mg/dL 14  14  17    Creatinine 0.61 - 1.24 mg/dL 2.13  0.86  5.78   Sodium 135 - 145 mmol/L 139  137  137   Potassium 3.5 - 5.1 mmol/L 4.5  4.5  4.8   Chloride 98 - 111 mmol/L 106  105  103   CO2 22 - 32 mmol/L 30  28  28    Calcium  8.9 - 10.3 mg/dL 8.5  8.2  8.3       Microbiology: Blood culture from 04/16/2024 has resulted as MRSA in 1 out of 4 bottles Surgical culture from the right foot has MRSA and Pseudomonas    Assessment/Plan: MRSA bacteremia Low bioburden The source of this is his right foot infection  Diabetic foot infection- right With osteo   Underwent TMA MRSA and Pseudomonas  and ESBL kleb in the culture This is the source for bacteremia Because of the chronicity of the wound at least present fo> r 2 weeks unclear how long he would have had the bacteremia but the bioburden was low 1/4 and time to positivity was 4 days and  2 d echo  reported no valvular endocarditis. The risk of endocarditis is low ( VIRSTA score is just 2 points as community acquired MRSA).Discussed with DrArida who reviewed the echo images and the valves were clear of infection- so TEE is not needed Pt is currently on  Meropenem and Dapto He will go home on these 2 antibiotics The duration will be decided by bone pathology Minimum 2 weeks is needed Talked to patient about ide effects of antibiotic and what to watch Will check CK weekly while on Dapto He will hold atorvastatin  till completion of  dapto   Left foot chronic stable wounds  Diabetes mellitus not well-controlled  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

## 2024-04-25 NOTE — TOC Progression Note (Signed)
 Transition of Care [facility]) - Progression Note    Patient Details  Name: Luan Maberry MRN: [id number] Date of Birth: [birth year]  Transition of [facility] CM/SW Contact  Alexandra Ice, RN Phone Number: 04/25/2024, 12:56 PM  Clinical Narrative:     Received call back from [facility] with [employer]. They need to verify benefits, and cut off for orders was noon today for the weekend. Plan for IV teaching and med delivery still for Tuesday. She is arranging home health RN for PICC line dressing changes.        Expected Discharge Plan and Services                                               Social Determinants of Health (SDOH) Interventions SDOH Screenings   Food Insecurity: No Food Insecurity (04/18/2024)  Housing: Low Risk  (04/18/2024)  Transportation Needs: No Transportation Needs (04/18/2024)  Utilities: Not At Risk (04/18/2024)  Depression (PHQ2-9): Low Risk  (08/28/2023)  Financial Resource Strain: Patient Declined (06/28/2023)   Received from [facility]  Social Connections: Unknown (05/24/2022)   [facility]  Tobacco Use: Medium Risk (04/23/2024)    Readmission Risk Interventions     No data to display

## 2024-04-26 DIAGNOSIS — E11628 Type 2 diabetes mellitus with other skin complications: Secondary | ICD-10-CM | POA: Diagnosis not present

## 2024-04-26 DIAGNOSIS — L089 Local infection of the skin and subcutaneous tissue, unspecified: Secondary | ICD-10-CM | POA: Diagnosis not present

## 2024-04-26 LAB — CBC
HCT: 26.3 % — ABNORMAL LOW (ref 39.0–52.0)
Hemoglobin: 8 g/dL — ABNORMAL LOW (ref 13.0–17.0)
MCH: 25 pg — ABNORMAL LOW (ref 26.0–34.0)
MCHC: 30.4 g/dL (ref 30.0–36.0)
MCV: 82.2 fL (ref 80.0–100.0)
Platelets: 408 10*3/uL — ABNORMAL HIGH (ref 150–400)
RBC: 3.2 MIL/uL — ABNORMAL LOW (ref 4.22–5.81)
RDW: 14.6 % (ref 11.5–15.5)
WBC: 10.2 10*3/uL (ref 4.0–10.5)
nRBC: 0 % (ref 0.0–0.2)

## 2024-04-26 LAB — GLUCOSE, CAPILLARY
Glucose-Capillary: 245 mg/dL — ABNORMAL HIGH (ref 70–99)
Glucose-Capillary: 187 mg/dL — ABNORMAL HIGH (ref 70–99)
Glucose-Capillary: 141 mg/dL — ABNORMAL HIGH (ref 70–99)
Glucose-Capillary: 230 mg/dL — ABNORMAL HIGH (ref 70–99)

## 2024-04-26 LAB — BASIC METABOLIC PANEL WITH GFR
Anion gap: 7 (ref 5–15)
BUN: 18 mg/dL (ref 6–20)
CO2: 27 mmol/L (ref 22–32)
Calcium: 8.7 mg/dL — ABNORMAL LOW (ref 8.9–10.3)
Chloride: 103 mmol/L (ref 98–111)
Creatinine, Ser: 1.21 mg/dL (ref 0.61–1.24)
GFR, Estimated: >60 mL/min (ref >60)
Glucose, Bld: 236 mg/dL — ABNORMAL HIGH (ref 70–99)
Potassium: 4.3 mmol/L (ref 3.5–5.1)
Sodium: 137 mmol/L (ref 135–145)

## 2024-04-26 LAB — MINIMUM INHIBITORY CONC. (1 DRUG)

## 2024-04-26 LAB — MIC RESULT

## 2024-04-26 LAB — MAGNESIUM: Magnesium: 2 mg/dL (ref 1.7–2.4)

## 2024-04-26 LAB — PHOSPHORUS: Phosphorus: 3.1 mg/dL (ref 2.5–4.6)

## 2024-04-26 NOTE — Plan of Care (Signed)
  Problem: Education: Goal: Ability to describe self-care measures that may prevent or decrease complications (Diabetes Survival Skills Education) will improve Outcome: Progressing   Problem: Coping: Goal: Ability to adjust to condition or change in health will improve Outcome: Progressing   Problem: Fluid Volume: Goal: Ability to maintain a balanced intake and output will improve Outcome: Progressing   Problem: Tissue Perfusion: Goal: Adequacy of tissue perfusion will improve Outcome: Progressing   Problem: Clinical Measurements: Goal: Cardiovascular complication will be avoided Outcome: Progressing   Problem: Coping: Goal: Level of anxiety will decrease Outcome: Progressing   Problem: Elimination: Goal: Will not experience complications related to bowel motility Outcome: Progressing   Problem: Pain Managment: Goal: General experience of comfort will improve and/or be controlled Outcome: Progressing

## 2024-04-26 NOTE — Progress Notes (Signed)
 [facility] Progress Note  Patient: [name]    ZOX:[id number]  DOA: 04/17/2024     Date of Service: the patient was seen and examined on 04/26/2024  Chief Complaint  Patient presents with   Foot Pain   Brief hospital course: [name] is a 58 y.o. male with medical history significant of hypertension, hyperlipidemia, diabetes mellitus, CKD-3A, former smoker, obesity, diabetic foot ulcer, who presents with right foot wound and right foot pain.   Patient was recently hospitalized from 4/22 - 4/25 due to diabetic foot infection with gangrenous change. Patient is s/p of right greater toe first ray amputation. Pt has been on Cipro  as cultures from 5/1 showed Pseudomonas, but his wound is getting worse, now has dehiscence of wound.  Patient has further pain which is constant, 7 out of 10 in severity, aching, nonradiating.  No fever or chills.  Patient does not have chest pain, cough, SOB.  No nausea, vomiting, diarrhea or abdominal pain.  No symptoms of UTI.    Patient was seen by Dr. Francee Inch of ID today. She recommended patient to go to ED for MRI, IV antibiotic, Wound bedridment and cultures.  Dr. Fawn Hooks tooks many pictures of both feet (please see the picture in Dr. Sherryle Don note).   Data reviewed independently and ED Course: pt was found to have WBC 10.2, GFR> 60, lactic acid 0.9 --> 0.8, temperature normal, blood pressure 134/84, heart rate 100, RR 18, oxygen  saturation 97% on room air.  Patient is admitted to MedSurg bed as inpatient.  Dr. Althea Atkinson of podiatry is consulted by EDP.  MRI reviewed: Abscess and gas formation, possible osteomyelitis. second metatarsal, second proximal phalanx, and the base of second middle phalanx, concerning for osteomyelitis. base of third proximal phalanx and mild marrow edema of the head of third metatarsal, could reflect reactive marrow changes, however, osteomyelitis can not be excluded. Diffuse edema of the intrinsic musculature of the  foot, concerning for myositis.   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  Status post I&D 5/16.   5/21 s/p right foot debridement with excision of remaining lesser dorsal and metatarsals, and Implantation antibiotic beads with vancomycin .   - continue  Zosyn , changed linezolid , to daptomycin  on 5/19 as per ID 5/20 DC'd Zosyn  and started meropenem  - PRN Zofran  for nausea, and Tylenol , morphine  and Percocet for pain - Blood cultures NGTD, ESR 109 and CRP 12.8 elevated  - wound care consulted 5/16 tissue culture from the OR is growing MRSA, Klebsiella (ESBL) and Pseudomonas. Blood culture grew MRSA, repeat blood culture negative. TTE negative for vegetations, LVEF 65 to 70%, grade 1 diastolic dysfunction. 5/22 patient was cleared by podiatry to discharge and follow-up as an outpatient As per ID Abx on on discharge: Daptomycin  8mg /kg body weight-= 700mg  IV every 24 hours  And Meropenem 1 gram Iv every 8 hours Duration: 2 weeks - may need more depending on the  pathology result End Date:05/07/24 Patient will need PICC line which will palced on Monday, as per PICC line team patient is high risk due to history of cocaine use Awaiting for ID to make the final decision for PICC line on Monday Patient is set up with infusion company to take oral on Tuesday  # Hypertension associated with diabetes, patient was not on any medication. Started losartan 25 mg p.o. daily Monitor BP and titrate  medications accordingly  # HLD (hyperlipidemia): Lipitor   # Chronic kidney disease, stage 3a: Renal function stable.     # Type II diabetes mellitus with renal manifestations: Poorly controlled. A1c 3 weeks ago 13.1.  Patient taking metformin  and NPH insulin  22 units twice daily -Increase sliding scale and increase NPH to 22u BID.     # Vitamin D  deficiency: started  vitamin D  50,000 units p.o. weekly, follow with PCP to repeat vitamin D  level after 3 to 6 months.  # Vitamin B12 level 287, goal >400, started vitamin B12 1000 mcg IM injection during hospital stay followed by oral supplement.  # Iron  deficiency anemia, transferrin saturation 9% Continue oral iron  supplementation.  Had colonoscopy in 2024 but had a poor prep.  He is to have a colonoscopy repeated this year, 2025 Follow-up with PCP to repeat iron  profile after 3 to 6 months Folate level within normal range. Low vitamin B12 as above  # Constipation, started laxatives.  Obesity class I Body mass index is 32.49 kg/m.  Interventions: Calorie restricted diet and daily exercise advised to lose body weight.  Lifestyle modification discussed.   Diet: Carb modified diet, low potassium diet DVT Prophylaxis: Subcutaneous Heparin     Advance goals of care discussion: Full code  Family Communication:  Discussed plan with patient and he is understanding.  Disposition:  Pt is from Home, admitted with Right foot abscess, DM foot infection and osteomyelitis, still on IV abx, s/p Sx done on 5/21  Discharge to Home, when cleared by ID.   DC planning on Monday, 04/29/2023  Subjective: No significant events overnight, foot pain was 7/10, patient took pain medications and currently pain is 4/10, improved after medications.  Patient was resting comfortably, denied any other complaints.   Physical Exam:  Physical Exam  Constitutional: In no distress.  Cardiovascular: Normal rate, regular rhythm. No lower extremity edema  Pulmonary: Non labored breathing on room air, no wheezing or rales.   Abdominal: Soft. Normal bowel sounds. Non distended and non tender Neurological: Alert and oriented to person, place, and time. Non focal  Extremities: s/p right foot surgery, dressing CDI.  Vitals:   04/25/24 1421 04/25/24 2032 04/26/24 0503 04/26/24 0802  BP: (!) 149/90 (!) 149/83 125/63 139/80  Pulse: 98 90  89 87  Resp: 16 16 18 17   Temp: 98.7 F (37.1 C) 98.5 F (36.9 C) 98 F (36.7 C) 97.9 F (36.6 C)  TempSrc:  Oral  Oral  SpO2: 100% 99% 99% 96%  Weight:      Height:        Intake/Output Summary (Last 24 hours) at 04/26/2024 1302 Last data filed at 04/26/2024 1052 Gross per 24 hour  Intake 240 ml  Output --  Net 240 ml   Filed Weights   04/17/24 1255 04/18/24 1443  Weight: 99.8 kg 99.8 kg    Data Reviewed:   CBC: Recent Labs  Lab 04/22/24 0447 04/23/24 0407 04/24/24 0452 04/25/24 0232 04/26/24 0742  WBC 9.0 10.2 11.6* 12.2* 10.2  HGB 8.2* 8.5* 7.8* 7.8* 8.0*  HCT 26.2* 27.5* 25.0* 25.3* 26.3*  MCV 81.4 81.8 80.9 83.0 82.2  PLT 448* 467* 421* 365 408*   Basic Metabolic Panel: Recent Labs  Lab 04/20/24 0432 04/21/24 0222 04/22/24 0447 04/23/24 0407 04/24/24 0452 04/25/24 0232 04/26/24 0742  NA 133* 135 135 137 137 139 137  K 4.5 4.2 4.3 4.8 4.5 4.5 4.3  CL 99 102 104 103 105 106 103  CO2 27  28 26 28 28 30 27   GLUCOSE 344* 225* 156* 273* 285* 202* 236*  BUN 18 17 17 17 14 14 18   CREATININE 1.38* 1.33* 1.34* 1.39* 1.22 1.22 1.21  CALCIUM  8.1* 8.0* 8.0* 8.3* 8.2* 8.5* 8.7*  MG 2.1 1.8  --   --   --   --  2.0  PHOS 2.5 3.0  --   --   --   --  3.1    Studies: US  EKG SITE RITE Result Date: 04/25/2024 If Site Rite image not attached, placement could not be confirmed due to current cardiac rhythm.     Scheduled Meds:  vitamin C   500 mg Oral Daily   atorvastatin   20 mg Oral Daily   bisacodyl   10 mg Oral QHS   Chlorhexidine  Gluconate Cloth  6 each Topical Daily   vitamin B-12  1,000 mcg Oral Daily   gabapentin   300 mg Oral BID   heparin   5,000 Units Subcutaneous Q8H   insulin  aspart  0-15 Units Subcutaneous TID WC   insulin  aspart  0-5 Units Subcutaneous QHS   insulin  NPH Human  22 Units Subcutaneous BID AC & HS   iron  polysaccharides  150 mg Oral Daily   losartan  25 mg Oral Daily   mupirocin  ointment  1 Application Nasal BID   polyethylene  glycol  17 g Oral BID   Vitamin D  (Ergocalciferol )  50,000 Units Oral Q7 days   Continuous Infusions:  DAPTOmycin  700 mg (04/25/24 1517)   meropenem (MERREM) IV 1 g (04/26/24 0525)   PRN Meds: acetaminophen , bisacodyl , hydrALAZINE , morphine  injection, ondansetron  (ZOFRAN ) IV, oxyCODONE -acetaminophen   Time spent: 35 minutes  Author: Joette Mustard. MD [facility] 04/26/2024 1:02 PM  To reach On-call, see care teams to locate the attending and reach out to them via [email or web address]. If 7PM-7AM, please contact night-coverage If you still have difficulty reaching the attending provider, please page the [facility] (Director on Call) for [facility] on amion for assistance.

## 2024-04-26 NOTE — Progress Notes (Signed)
 Secure chat with Dr Hubert Madden re PICC placement with hx of IVDA.  States to wait until Monday for PICC placement, will review with Dr Conard Decent re IVDA.  Planning on dc home Tuesday.

## 2024-04-27 DIAGNOSIS — L089 Local infection of the skin and subcutaneous tissue, unspecified: Secondary | ICD-10-CM | POA: Diagnosis not present

## 2024-04-27 DIAGNOSIS — E11628 Type 2 diabetes mellitus with other skin complications: Secondary | ICD-10-CM | POA: Diagnosis not present

## 2024-04-27 LAB — GLUCOSE, CAPILLARY
Glucose-Capillary: 248 mg/dL — ABNORMAL HIGH (ref 70–99)
Glucose-Capillary: 181 mg/dL — ABNORMAL HIGH (ref 70–99)
Glucose-Capillary: 233 mg/dL — ABNORMAL HIGH (ref 70–99)
Glucose-Capillary: 293 mg/dL — ABNORMAL HIGH (ref 70–99)
Glucose-Capillary: 311 mg/dL — ABNORMAL HIGH (ref 70–99)

## 2024-04-27 LAB — BASIC METABOLIC PANEL WITH GFR
Anion gap: 9 (ref 5–15)
BUN: 18 mg/dL (ref 6–20)
CO2: 28 mmol/L (ref 22–32)
Calcium: 8.9 mg/dL (ref 8.9–10.3)
Chloride: 100 mmol/L (ref 98–111)
Creatinine, Ser: 1.18 mg/dL (ref 0.61–1.24)
GFR, Estimated: >60 mL/min (ref >60)
Glucose, Bld: 162 mg/dL — ABNORMAL HIGH (ref 70–99)
Potassium: 4.1 mmol/L (ref 3.5–5.1)
Sodium: 137 mmol/L (ref 135–145)

## 2024-04-27 LAB — CULTURE, BLOOD (ROUTINE X 2)
Culture: NO GROWTH
Culture: NO GROWTH
Special Requests: ADEQUATE
Special Requests: ADEQUATE

## 2024-04-27 LAB — CBC
HCT: 25.3 % — ABNORMAL LOW (ref 39.0–52.0)
Hemoglobin: 7.8 g/dL — ABNORMAL LOW (ref 13.0–17.0)
MCH: 25 pg — ABNORMAL LOW (ref 26.0–34.0)
MCHC: 30.8 g/dL (ref 30.0–36.0)
MCV: 81.1 fL (ref 80.0–100.0)
Platelets: 374 10*3/uL (ref 150–400)
RBC: 3.12 MIL/uL — ABNORMAL LOW (ref 4.22–5.81)
RDW: 14.8 % (ref 11.5–15.5)
WBC: 9.1 10*3/uL (ref 4.0–10.5)
nRBC: 0 % (ref 0.0–0.2)

## 2024-04-27 NOTE — Progress Notes (Signed)
 [facility] Progress Note  Patient: [name]    ZOX:[id number]  DOA: 04/17/2024     Date of Service: the patient was seen and examined on 04/27/2024  Chief Complaint  Patient presents with   Foot Pain   Brief hospital course: [name] is a 58 y.o. male with medical history significant of hypertension, hyperlipidemia, diabetes mellitus, CKD-3A, former smoker, obesity, diabetic foot ulcer, who presents with right foot wound and right foot pain.   Patient was recently hospitalized from 4/22 - 4/25 due to diabetic foot infection with gangrenous change. Patient is s/p of right greater toe first ray amputation. Pt has been on Cipro  as cultures from 5/1 showed Pseudomonas, but his wound is getting worse, now has dehiscence of wound.  Patient has further pain which is constant, 7 out of 10 in severity, aching, nonradiating.  No fever or chills.  Patient does not have chest pain, cough, SOB.  No nausea, vomiting, diarrhea or abdominal pain.  No symptoms of UTI.    Patient was seen by Dr. Francee Inch of ID today. She recommended patient to go to ED for MRI, IV antibiotic, Wound bedridment and cultures.  Dr. Fawn Hooks tooks many pictures of both feet (please see the picture in Dr. Sherryle Don note).   Data reviewed independently and ED Course: pt was found to have WBC 10.2, GFR> 60, lactic acid 0.9 --> 0.8, temperature normal, blood pressure 134/84, heart rate 100, RR 18, oxygen  saturation 97% on room air.  Patient is admitted to MedSurg bed as inpatient.  Dr. Althea Atkinson of podiatry is consulted by EDP.  MRI reviewed: Abscess and gas formation, possible osteomyelitis. second metatarsal, second proximal phalanx, and the base of second middle phalanx, concerning for osteomyelitis. base of third proximal phalanx and mild marrow edema of the head of third metatarsal, could reflect reactive marrow changes, however, osteomyelitis can not be excluded. Diffuse edema of the intrinsic musculature of the  foot, concerning for myositis.   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  Status post I&D 5/16.   5/21 s/p right foot debridement with excision of remaining lesser dorsal and metatarsals, and Implantation antibiotic beads with vancomycin .   S/p  Zosyn , changed linezolid , to daptomycin  on 5/19 as per ID 5/20 DC'd Zosyn  and started meropenem  - PRN Zofran  for nausea, and Tylenol , morphine  and Percocet for pain - Blood cultures NGTD, ESR 109 and CRP 12.8 elevated  - wound care consulted 5/16 tissue culture from the OR is growing MRSA, Klebsiella (ESBL) and Pseudomonas. Blood culture grew MRSA, repeat blood culture negative. TTE negative for vegetations, LVEF 65 to 70%, grade 1 diastolic dysfunction. 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 Started losartan 25 mg p.o. daily Monitor BP and titrate medications  accordingly  # HLD (hyperlipidemia): Lipitor   # Chronic kidney disease, stage 3a: Renal function stable.     # Type II diabetes mellitus with renal manifestations: Poorly controlled. A1c 3 weeks ago 13.1.  Patient taking metformin  and NPH insulin  22 units twice daily -Increase sliding scale and increase NPH to 22u BID.     # Vitamin D  deficiency: started vitamin  D 50,000 units p.o. weekly, follow with PCP to repeat vitamin D  level after 3 to 6 months.  # Vitamin B12 level 287, goal >400, started vitamin B12 1000 mcg IM injection during hospital stay followed by oral supplement.  # Iron  deficiency anemia, transferrin saturation 9% Continue oral iron  supplementation.  Had colonoscopy in 2024 but had a poor prep.  He is to have a colonoscopy repeated this year, 2025 Follow-up with PCP to repeat iron  profile after 3 to 6 months Folate level within normal range. Low vitamin B12 as above  # Constipation, started laxatives.  Obesity class I Body mass index is 32.49 kg/m.  Interventions: Calorie restricted diet and daily exercise advised to lose body weight.  Lifestyle modification discussed.   Diet: Carb modified diet, low potassium diet DVT Prophylaxis: Subcutaneous Heparin     Advance goals of care discussion: Full code  Family Communication:  Discussed plan with patient and he is understanding.  Disposition:  Pt is from Home, admitted with Right foot abscess, DM foot infection and osteomyelitis, still on IV abx, s/p Sx done on 5/21  Discharge to Home, when cleared by ID.   DC planning on Monday, 04/29/2023  Subjective: No significant events overnight, patient had pain in the foot when he woke up around 8/10 which resolved after taking pain medications and again at his hurting 4/10.  Patient was resting comfortably, denied any other complaints.   Physical Exam:  Physical Exam  Constitutional: In no distress.  Cardiovascular: Normal rate, regular rhythm. No lower extremity edema  Pulmonary: Non labored breathing on room air, no wheezing or rales.   Abdominal: Soft. Normal bowel sounds. Non distended and non tender Neurological: Alert and oriented to person, place, and time. Non focal  Extremities: s/p right foot surgery, dressing CDI.  Vitals:   04/26/24 1554 04/26/24 1943 04/27/24 0449 04/27/24 0838  BP: (!) 140/80 128/74 125/68 (!)  140/76  Pulse: 87 86 81 90  Resp: 17 16 18 17   Temp: 97.9 F (36.6 C) 98.2 F (36.8 C) 97.8 F (36.6 C) 98.2 F (36.8 C)  TempSrc:    Oral  SpO2: 99% 99% 99% 100%  Weight:      Height:       No intake or output data in the 24 hours ending 04/27/24 1232  Filed Weights   04/17/24 1255 04/18/24 1443  Weight: 99.8 kg 99.8 kg    Data Reviewed:   CBC: Recent Labs  Lab 04/23/24 0407 04/24/24 0452 04/25/24 0232 04/26/24 0742 04/27/24 0702  WBC 10.2 11.6* 12.2* 10.2 9.1  HGB 8.5* 7.8* 7.8* 8.0* 7.8*  HCT 27.5* 25.0* 25.3* 26.3* 25.3*  MCV 81.8 80.9 83.0 82.2 81.1  PLT 467* 421* 365 408* 374   Basic Metabolic Panel: Recent Labs  Lab 04/21/24 0222 04/22/24 0447 04/23/24 0407 04/24/24 0452 04/25/24 0232 04/26/24 0742 04/27/24 0702  NA 135   < > 137 137 139 137 137  K 4.2   < > 4.8 4.5 4.5 4.3 4.1  CL 102   < > 103 105 106 103 100  CO2 28   < >  28 28 30 27 28   GLUCOSE 225*   < > 273* 285* 202* 236* 162*  BUN 17   < > 17 14 14 18 18   CREATININE 1.33*   < > 1.39* 1.22 1.22 1.21 1.18  CALCIUM  8.0*   < > 8.3* 8.2* 8.5* 8.7* 8.9  MG 1.8  --   --   --   --  2.0  --   PHOS 3.0  --   --   --   --  3.1  --    < > = values in this interval not displayed.    Studies: No results found.     Scheduled Meds:  vitamin C   500 mg Oral Daily   atorvastatin   20 mg Oral Daily   bisacodyl   10 mg Oral QHS   Chlorhexidine  Gluconate Cloth  6 each Topical Daily   vitamin B-12  1,000 mcg Oral Daily   gabapentin   300 mg Oral BID   heparin   5,000 Units Subcutaneous Q8H   insulin  aspart  0-15 Units Subcutaneous TID WC   insulin  aspart  0-5 Units Subcutaneous QHS   insulin  NPH Human  22 Units Subcutaneous BID AC & HS   iron  polysaccharides  150 mg Oral Daily   losartan  25 mg Oral Daily   mupirocin  ointment  1 Application Nasal BID   polyethylene glycol  17 g Oral BID   Vitamin D  (Ergocalciferol )  50,000 Units Oral Q7 days   Continuous Infusions:  DAPTOmycin  700 mg (04/26/24  1439)   meropenem (MERREM) IV 1 g (04/27/24 0611)   PRN Meds: acetaminophen , bisacodyl , hydrALAZINE , morphine  injection, ondansetron  (ZOFRAN ) IV, oxyCODONE -acetaminophen   Time spent: 35 minutes  Author: Joette Mustard. MD [facility] 04/27/2024 12:32 PM  To reach On-call, see care teams to locate the attending and reach out to them via [email or web address]. If 7PM-7AM, please contact night-coverage If you still have difficulty reaching the attending provider, please page the [facility] (Director on Call) for [facility] on amion for assistance.

## 2024-04-27 NOTE — Plan of Care (Signed)
   Problem: Education: Goal: Ability to describe self-care measures that may prevent or decrease complications (Diabetes Survival Skills Education) will improve Outcome: Progressing Goal: Individualized Educational Video(s) Outcome: Progressing

## 2024-04-28 DIAGNOSIS — B9562 Methicillin resistant Staphylococcus aureus infection as the cause of diseases classified elsewhere: Secondary | ICD-10-CM | POA: Diagnosis not present

## 2024-04-28 DIAGNOSIS — E11628 Type 2 diabetes mellitus with other skin complications: Secondary | ICD-10-CM | POA: Diagnosis not present

## 2024-04-28 DIAGNOSIS — L089 Local infection of the skin and subcutaneous tissue, unspecified: Secondary | ICD-10-CM | POA: Diagnosis not present

## 2024-04-28 DIAGNOSIS — B965 Pseudomonas (aeruginosa) (mallei) (pseudomallei) as the cause of diseases classified elsewhere: Secondary | ICD-10-CM | POA: Diagnosis not present

## 2024-04-28 DIAGNOSIS — R7881 Bacteremia: Secondary | ICD-10-CM | POA: Diagnosis not present

## 2024-04-28 LAB — BASIC METABOLIC PANEL WITH GFR
Anion gap: 10 (ref 5–15)
BUN: 20 mg/dL (ref 6–20)
CO2: 23 mmol/L (ref 22–32)
Calcium: 8.8 mg/dL — ABNORMAL LOW (ref 8.9–10.3)
Chloride: 103 mmol/L (ref 98–111)
Creatinine, Ser: 1.12 mg/dL (ref 0.61–1.24)
GFR, Estimated: >60 mL/min (ref >60)
Glucose, Bld: 149 mg/dL — ABNORMAL HIGH (ref 70–99)
Potassium: 4 mmol/L (ref 3.5–5.1)
Sodium: 136 mmol/L (ref 135–145)

## 2024-04-28 LAB — GLUCOSE, CAPILLARY
Glucose-Capillary: 163 mg/dL — ABNORMAL HIGH (ref 70–99)
Glucose-Capillary: 210 mg/dL — ABNORMAL HIGH (ref 70–99)
Glucose-Capillary: 202 mg/dL — ABNORMAL HIGH (ref 70–99)
Glucose-Capillary: 240 mg/dL — ABNORMAL HIGH (ref 70–99)

## 2024-04-28 LAB — CBC
HCT: 25.9 % — ABNORMAL LOW (ref 39.0–52.0)
Hemoglobin: 8.2 g/dL — ABNORMAL LOW (ref 13.0–17.0)
MCH: 25.5 pg — ABNORMAL LOW (ref 26.0–34.0)
MCHC: 31.7 g/dL (ref 30.0–36.0)
MCV: 80.4 fL (ref 80.0–100.0)
Platelets: 370 10*3/uL (ref 150–400)
RBC: 3.22 MIL/uL — ABNORMAL LOW (ref 4.22–5.81)
RDW: 14.7 % (ref 11.5–15.5)
WBC: 9.2 10*3/uL (ref 4.0–10.5)
nRBC: 0 % (ref 0.0–0.2)

## 2024-04-28 LAB — CK: Total CK: 45 U/L — ABNORMAL LOW (ref 49–397)

## 2024-04-28 LAB — CULTURE, BLOOD (ROUTINE X 2)

## 2024-04-28 MED ORDER — SODIUM CHLORIDE 0.9% FLUSH: 10.0000 mL | INTRAVENOUS | Status: DC | PRN

## 2024-04-28 NOTE — Progress Notes (Signed)
 Dressing change bilateral feet. Patient stated he had already changed dressing to left foot, but nurse checked and patient had forgotten to apply aquacel Ag, Nurse redressed foot as ordered and changed dressing to right foot as well.

## 2024-04-28 NOTE — Progress Notes (Signed)
 Peripherally Inserted Central Catheter Placement  The IV Nurse has discussed with the patient and/or persons authorized to consent for the patient, the purpose of this procedure and the potential benefits and risks involved with this procedure.  The benefits include less needle sticks, lab draws from the catheter, and the patient may be discharged home with the catheter. Risks include, but not limited to, infection, bleeding, blood clot (thrombus formation), and puncture of an artery; nerve damage and irregular heartbeat and possibility to perform a PICC exchange if needed/ordered by physician.  Alternatives to this procedure were also discussed.  Bard Power PICC patient education guide, fact sheet on infection prevention and patient information card has been provided to patient /or left at bedside.    PICC Placement Documentation  PICC Single Lumen 04/28/24 Right Brachial 39 cm 0 cm (Active)  Indication for Insertion or Continuance of Line Home intravenous therapies (PICC only) 04/28/24 1149  Exposed Catheter (cm) 0 cm 04/28/24 1149  Site Assessment Clean, Dry, Intact 04/28/24 1149  Line Status Flushed;Saline locked;Blood return noted 04/28/24 1149  Dressing Type Transparent;Securing device 04/28/24 1149  Dressing Status Antimicrobial disc/dressing in place;Clean, Dry, Intact 04/28/24 1149  Line Care Connections checked and tightened 04/28/24 1149  Line Adjustment (NICU/IV Team Only) No 04/28/24 1149  Dressing Intervention New dressing;Adhesive placed at insertion site (IV team only) 04/28/24 1149  Dressing Change Due 05/05/24 04/28/24 1149       Nadean August 04/28/2024, 11:50 AM

## 2024-04-28 NOTE — Progress Notes (Signed)
 [facility] Progress Note  Patient: [name]    ZOX:[id number]  DOA: 04/17/2024     Date of Service: the patient was seen and examined on 04/28/2024  Chief Complaint  Patient presents with   Foot Pain   Brief hospital course: [name] is a 58 y.o. male with medical history significant of hypertension, hyperlipidemia, diabetes mellitus, CKD-3A, former smoker, obesity, diabetic foot ulcer, who presents with right foot wound and right foot pain.   Patient was recently hospitalized from 4/22 - 4/25 due to diabetic foot infection with gangrenous change. Patient is s/p of right greater toe first ray amputation. Pt has been on Cipro  as cultures from 5/1 showed Pseudomonas, but his wound is getting worse, now has dehiscence of wound.  Patient has further pain which is constant, 7 out of 10 in severity, aching, nonradiating.  No fever or chills.  Patient does not have chest pain, cough, SOB.  No nausea, vomiting, diarrhea or abdominal pain.  No symptoms of UTI.    Patient was seen by Dr. Francee Inch of ID today. She recommended patient to go to ED for MRI, IV antibiotic, Wound bedridment and cultures.  Dr. Fawn Hooks tooks many pictures of both feet (please see the picture in Dr. Sherryle Don note).   Data reviewed independently and ED Course: pt was found to have WBC 10.2, GFR> 60, lactic acid 0.9 --> 0.8, temperature normal, blood pressure 134/84, heart rate 100, RR 18, oxygen  saturation 97% on room air.  Patient is admitted to MedSurg bed as inpatient.  Dr. Althea Atkinson of podiatry is consulted by EDP.  MRI reviewed: Abscess and gas formation, possible osteomyelitis. second metatarsal, second proximal phalanx, and the base of second middle phalanx, concerning for osteomyelitis. base of third proximal phalanx and mild marrow edema of the head of third metatarsal, could reflect reactive marrow changes, however, osteomyelitis can not be excluded. Diffuse edema of the intrinsic musculature of the  foot, concerning for myositis.   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  Status post I&D 5/16.   5/21 s/p right foot debridement with excision of remaining lesser dorsal and metatarsals, and Implantation antibiotic beads with vancomycin .   S/p  Zosyn , changed linezolid , to daptomycin  on 5/19 as per ID 5/20 DC'd Zosyn  and started meropenem  - PRN Zofran  for nausea, and Tylenol , morphine  and Percocet for pain - Blood cultures NGTD, ESR 109 and CRP 12.8 elevated  - wound care consulted 5/16 tissue culture from the OR is growing MRSA, Klebsiella (ESBL) and Pseudomonas. Blood culture grew MRSA, repeat blood culture negative. TTE negative for vegetations, LVEF 65 to 70%, grade 1 diastolic dysfunction. 5/22 patient was cleared by podiatry to discharge and follow-up as an outpatient As per ID Abx on on discharge: Daptomycin  8mg /kg body weight-= 700mg  IV every 24 hours  And Meropenem 1 gram Iv every 8 hours Duration: 2 weeks - may need more depending on the  pathology result End Date:05/07/24 5/26 s/p PICC line inserted As per ID antibiotics needs to be arranged most likely it will be done tomorrow a.m.  # Hypertension associated with diabetes, patient was not on any medication. Started losartan 25 mg p.o. daily Monitor BP and titrate medications accordingly  # HLD (hyperlipidemia): Lipitor   # Chronic kidney disease, stage 3a: Renal function stable.     # Type II diabetes mellitus with renal manifestations:  Poorly controlled. A1c 3 weeks ago 13.1.  Patient taking metformin  and NPH insulin  22 units twice daily -Increase sliding scale and increase NPH to 22u BID.     # Vitamin D  deficiency: started vitamin D  50,000 units p.o. weekly, follow with PCP to repeat vitamin D  level after 3 to 6 months.  # Vitamin B12 level 287, goal >400, started vitamin  B12 1000 mcg IM injection during hospital stay followed by oral supplement.  # Iron  deficiency anemia, transferrin saturation 9% Continue oral iron  supplementation.  Had colonoscopy in 2024 but had a poor prep.  He is to have a colonoscopy repeated this year, 2025 Follow-up with PCP to repeat iron  profile after 3 to 6 months Folate level within normal range. Low vitamin B12 as above  # Constipation, started laxatives.  Obesity class I Body mass index is 32.49 kg/m.  Interventions: Calorie restricted diet and daily exercise advised to lose body weight.  Lifestyle modification discussed.   Diet: Carb modified diet, low potassium diet DVT Prophylaxis: Subcutaneous Heparin     Advance goals of care discussion: Full code  Family Communication:  Discussed plan with patient and he is understanding.  Disposition:  Pt is from Home, admitted with Right foot abscess, DM foot infection and osteomyelitis, still on IV abx, s/p Sx done on 5/21  Discharge to Home, when cleared by ID.   DC planning on Tuesday, 04/30/2023  Subjective: No significant events overnight, right foot pain 8/10 before medications, and pain improved now 3-10. Patient received PICC line, without any complications. Plan is for discharge tomorrow a.m. with antibiotics arrangement as an outpatient   Physical Exam:  Physical Exam  Constitutional: In no distress.  Cardiovascular: Normal rate, regular rhythm. No lower extremity edema  Pulmonary: Non labored breathing on room air, no wheezing or rales.   Abdominal: Soft. Normal bowel sounds. Non distended and non tender Neurological: Alert and oriented to person, place, and time. Non focal  Extremities: s/p right foot surgery, dressing CDI.  Vitals:   04/27/24 1528 04/27/24 2006 04/28/24 0410 04/28/24 0730  BP: 133/70 134/79 122/81 128/75  Pulse: 95 96 87 87  Resp: 17 18 18 17   Temp: 98.4 F (36.9 C) 98.8 F (37.1 C) 98.1 F (36.7 C) 98.3 F (36.8 C)  TempSrc: Oral      SpO2: 99% 99% 99% 98%  Weight:      Height:        Intake/Output Summary (Last 24 hours) at 04/28/2024 1333 Last data filed at 04/28/2024 1331 Gross per 24 hour  Intake 720 ml  Output --  Net 720 ml    Filed Weights   04/17/24 1255 04/18/24 1443  Weight: 99.8 kg 99.8 kg    Data Reviewed:   CBC: Recent Labs  Lab 04/24/24 0452 04/25/24 0232 04/26/24 0742 04/27/24 0702 04/28/24 0356  WBC 11.6* 12.2* 10.2 9.1 9.2  HGB 7.8* 7.8* 8.0* 7.8* 8.2*  HCT 25.0* 25.3* 26.3* 25.3* 25.9*  MCV 80.9 83.0 82.2 81.1 80.4  PLT 421* 365 408* 374 370   Basic Metabolic Panel: Recent Labs  Lab 04/24/24 0452 04/25/24 0232 04/26/24 0742 04/27/24 0702 04/28/24 0356  NA 137 139 137 137 136  K 4.5 4.5 4.3 4.1 4.0  CL 105 106 103 100 103  CO2 28 30 27 28 23   GLUCOSE 285* 202* 236* 162* 149*  BUN 14 14 18 18 20   CREATININE 1.22 1.22 1.21 1.18 1.12  CALCIUM  8.2* 8.5* 8.7* 8.9 8.8*  MG  --   --  2.0  --   --   PHOS  --   --  3.1  --   --     Studies: No results found.     Scheduled Meds:  vitamin C   500 mg Oral Daily   atorvastatin   20 mg Oral Daily   bisacodyl   10 mg Oral QHS   Chlorhexidine  Gluconate Cloth  6 each Topical Daily   vitamin B-12  1,000 mcg Oral Daily   gabapentin   300 mg Oral BID   heparin   5,000 Units Subcutaneous Q8H   insulin  aspart  0-15 Units Subcutaneous TID WC   insulin  aspart  0-5 Units Subcutaneous QHS   insulin  NPH Human  22 Units Subcutaneous BID AC & HS   iron  polysaccharides  150 mg Oral Daily   losartan  25 mg Oral Daily   mupirocin  ointment  1 Application Nasal BID   polyethylene glycol  17 g Oral BID   Vitamin D  (Ergocalciferol )  50,000 Units Oral Q7 days   Continuous Infusions:  DAPTOmycin  700 mg (04/27/24 1600)   meropenem (MERREM) IV Stopped (04/28/24 0559)   PRN Meds: acetaminophen , bisacodyl , hydrALAZINE , morphine  injection, ondansetron  (ZOFRAN ) IV, oxyCODONE -acetaminophen , sodium chloride  flush  Time spent: 35  minutes  Author: Joette Mustard. MD [facility] 04/28/2024 1:33 PM  To reach On-call, see care teams to locate the attending and reach out to them via [email or web address]. If 7PM-7AM, please contact night-coverage If you still have difficulty reaching the attending provider, please page the [facility] (Director on Call) for [facility] on amion for assistance.

## 2024-04-28 NOTE — Plan of Care (Signed)

## 2024-04-28 NOTE — Progress Notes (Signed)
 Date of Admission:  04/17/2024      ID: [name] is a 58 y.o. male Principal Problem:   Diabetic foot infection (HCC) Active Problems:   Obesity (BMI 30-39.9)   Hypertension associated with diabetes (HCC)   HLD (hyperlipidemia)   Chronic kidney disease, stage 3a (HCC)   Type II diabetes mellitus with renal manifestations (HCC)   Normocytic anemia   MRSA bacteremia    Subjective: Pt with no complaints Had PICC line placed today   Medications:   vitamin C   500 mg Oral Daily   atorvastatin   20 mg Oral Daily   bisacodyl   10 mg Oral QHS   Chlorhexidine  Gluconate Cloth  6 each Topical Daily   vitamin B-12  1,000 mcg Oral Daily   gabapentin   300 mg Oral BID   heparin   5,000 Units Subcutaneous Q8H   insulin  aspart  0-15 Units Subcutaneous TID WC   insulin  aspart  0-5 Units Subcutaneous QHS   insulin  NPH Human  22 Units Subcutaneous BID AC & HS   iron  polysaccharides  150 mg Oral Daily   losartan  25 mg Oral Daily   mupirocin  ointment  1 Application Nasal BID   polyethylene glycol  17 g Oral BID   Vitamin D  (Ergocalciferol )  50,000 Units Oral Q7 days    Objective: Vital signs in last 24 hours: Patient Vitals for the past 24 hrs:  BP Temp Pulse Resp SpO2  04/28/24 1444 138/81 98 F (36.7 C) 85 16 99 %  04/28/24 0730 128/75 98.3 F (36.8 C) 87 17 98 %  04/28/24 0410 122/81 98.1 F (36.7 C) 87 18 99 %  04/27/24 2006 134/79 98.8 F (37.1 C) 96 18 99 %      PHYSICAL EXAM:  General: Alert, cooperative, no distress, appears stated age.  Lungs: Clear to auscultation bilaterally. No Wheezing or Rhonchi. No rales. Heart: Regular rate and rhythm, no murmur, rub or gallop. Abdomen: Soft, non-tender,not distended. Bowel sounds normal. No masses Extremities:  Right foot dressing changed   04/25/24   04/17/24        Left foot chronic wounds on the plantar aspect  Skin: No rashes or lesions. Or bruising    Lab Results    Latest Ref Rng & Units 04/28/2024     3:56 AM 04/27/2024    7:02 AM 04/26/2024    7:42 AM  CBC  WBC 4.0 - 10.5 K/uL 9.2  9.1  10.2   Hemoglobin 13.0 - 17.0 g/dL 8.2  7.8  8.0   Hematocrit 39.0 - 52.0 % 25.9  25.3  26.3   Platelets 150 - 400 K/uL 370  374  408        Latest Ref Rng & Units 04/28/2024    3:56 AM 04/27/2024    7:02 AM 04/26/2024    7:42 AM  CMP  Glucose 70 - 99 mg/dL 191  478  295   BUN 6 - 20 mg/dL 20  18  18    Creatinine 0.61 - 1.24 mg/dL 6.21  3.08  6.57   Sodium 135 - 145 mmol/L 136  137  137   Potassium 3.5 - 5.1 mmol/L 4.0  4.1  4.3   Chloride 98 - 111 mmol/L 103  100  103   CO2 22 - 32 mmol/L 23  28  27    Calcium  8.9 - 10.3 mg/dL 8.8  8.9  8.7       Microbiology: Blood culture from 04/16/2024 has resulted as  MRSA in 1 out of 4 bottles Surgical culture from the right foot has MRSA and Pseudomonas    Assessment/Plan: MRSA bacteremia Low bioburden The source of this is his right foot infection  Diabetic foot infection- right With osteo   Underwent TMA- pathology pending MRSA and Pseudomonas  and ESBL kleb in the culture This is the source for bacteremia Because of the chronicity of the wound at least present fo> r 2 weeks unclear how long he would have had the bacteremia but the bioburden was low 1/4 and time to positivity was 4 days and  2 d echo  reported no valvular endocarditis. The risk of endocarditis is low ( VIRSTA score is just 2 points as community acquired MRSA).Discussed with DrArida who reviewed the echo images and the valves were clear of infection- so TEE is not needed Pt is currently on  Meropenem and Dapto He will go home on these 2 antibiotics The duration will be decided by bone pathology Minimum 2 weeks is needed- may need upto 6 weeks Talked to patient about side effects of antibiotic and what to watch Will check CK weekly while on Dapto He will hold atorvastatin  till completion of dapto   Left foot chronic stable wounds  Diabetes mellitus not  well-controlled  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

## 2024-04-28 NOTE — Progress Notes (Signed)
 Per secure chat with Ravishankar,Jay ID and Althia Atlas, MD ok to place the PICC line.

## 2024-04-29 ENCOUNTER — Other Ambulatory Visit: Payer: Self-pay

## 2024-04-29 ENCOUNTER — Other Ambulatory Visit (HOSPITAL_COMMUNITY): Payer: Self-pay

## 2024-04-29 DIAGNOSIS — B9562 Methicillin resistant Staphylococcus aureus infection as the cause of diseases classified elsewhere: Secondary | ICD-10-CM | POA: Diagnosis not present

## 2024-04-29 DIAGNOSIS — E1140 Type 2 diabetes mellitus with diabetic neuropathy, unspecified: Secondary | ICD-10-CM

## 2024-04-29 DIAGNOSIS — E11628 Type 2 diabetes mellitus with other skin complications: Secondary | ICD-10-CM | POA: Diagnosis not present

## 2024-04-29 DIAGNOSIS — L089 Local infection of the skin and subcutaneous tissue, unspecified: Secondary | ICD-10-CM | POA: Diagnosis not present

## 2024-04-29 DIAGNOSIS — R7881 Bacteremia: Secondary | ICD-10-CM | POA: Diagnosis not present

## 2024-04-29 LAB — BASIC METABOLIC PANEL WITH GFR
Anion gap: 9 (ref 5–15)
BUN: 22 mg/dL — ABNORMAL HIGH (ref 6–20)
CO2: 27 mmol/L (ref 22–32)
Calcium: 9.2 mg/dL (ref 8.9–10.3)
Chloride: 103 mmol/L (ref 98–111)
Creatinine, Ser: 1.17 mg/dL (ref 0.61–1.24)
GFR, Estimated: >60 mL/min (ref >60)
Glucose, Bld: 230 mg/dL — ABNORMAL HIGH (ref 70–99)
Potassium: 4.4 mmol/L (ref 3.5–5.1)
Sodium: 139 mmol/L (ref 135–145)

## 2024-04-29 LAB — CULTURE, BLOOD (ROUTINE X 2)
Culture  Setup Time: NONE SEEN
Culture: NO GROWTH
Report Status: NO GROWTH
Special Requests: ADEQUATE
Special Requests: ADEQUATE

## 2024-04-29 LAB — CBC
HCT: 29.2 % — ABNORMAL LOW (ref 39.0–52.0)
Hemoglobin: 9 g/dL — ABNORMAL LOW (ref 13.0–17.0)
MCH: 25.1 pg — ABNORMAL LOW (ref 26.0–34.0)
MCHC: 30.8 g/dL (ref 30.0–36.0)
MCV: 81.3 fL (ref 80.0–100.0)
Platelets: 374 10*3/uL (ref 150–400)
RBC: 3.59 MIL/uL — ABNORMAL LOW (ref 4.22–5.81)
RDW: 14.9 % (ref 11.5–15.5)
WBC: 10 10*3/uL (ref 4.0–10.5)
nRBC: 0 % (ref 0.0–0.2)

## 2024-04-29 LAB — GLUCOSE, CAPILLARY
Glucose-Capillary: 219 mg/dL — ABNORMAL HIGH (ref 70–99)
Glucose-Capillary: 239 mg/dL — ABNORMAL HIGH (ref 70–99)

## 2024-04-29 LAB — SURGICAL PATHOLOGY

## 2024-04-29 MED ORDER — INSULIN NPH (HUMAN) (ISOPHANE) 100 UNIT/ML ~~LOC~~ SUSP
24.0000 [IU] | Freq: Every day | SUBCUTANEOUS | Status: DC
  Filled 2024-04-29: qty 10

## 2024-04-29 MED ORDER — VITAMIN D (ERGOCALCIFEROL) 1.25 MG (50000 UNIT) PO CAPS
50000.0000 [IU] | ORAL_CAPSULE | ORAL | 0 refills | Status: AC
  Filled 2024-04-29: qty 12, 84d supply, fill #0

## 2024-04-29 MED ORDER — INSULIN ASPART 100 UNIT/ML IJ SOLN
3.0000 [IU] | Freq: Three times a day (TID) | INTRAMUSCULAR | Status: DC
  Administered 2024-04-29: 3 [IU] via SUBCUTANEOUS
  Filled 2024-04-29: qty 1

## 2024-04-29 MED ORDER — MEROPENEM IV (FOR PTA / DISCHARGE USE ONLY): 1.0000 g | Freq: Three times a day (TID) | INTRAVENOUS | 0 refills | Status: AC

## 2024-04-29 MED ORDER — BISACODYL 5 MG PO TBEC
10.0000 mg | DELAYED_RELEASE_TABLET | Freq: Every evening | ORAL | 0 refills | Status: AC | PRN
  Filled 2024-04-29: qty 30, 15d supply, fill #0

## 2024-04-29 MED ORDER — HUMULIN N KWIKPEN 100 UNIT/ML ~~LOC~~ SUPN
22.0000 [IU] | PEN_INJECTOR | Freq: Two times a day (BID) | SUBCUTANEOUS | 5 refills | Status: DC
Start: 2024-04-29 — End: 2024-09-26
  Filled 2024-04-29 (×2): qty 30, 68d supply, fill #0
  Filled 2024-07-01 – 2024-08-25 (×3): qty 30, 68d supply, fill #1

## 2024-04-29 MED ORDER — LOSARTAN POTASSIUM 25 MG PO TABS
25.0000 mg | ORAL_TABLET | Freq: Every day | ORAL | 11 refills | Status: DC
Start: 2024-04-30 — End: 2024-09-29
  Filled 2024-04-29: qty 30, 30d supply, fill #0

## 2024-04-29 MED ORDER — OXYCODONE-ACETAMINOPHEN 5-325 MG PO TABS
1.0000 | ORAL_TABLET | Freq: Three times a day (TID) | ORAL | 0 refills | Status: AC | PRN
  Filled 2024-04-29: qty 21, 7d supply, fill #0

## 2024-04-29 MED ORDER — INSULIN NPH (HUMAN) (ISOPHANE) 100 UNIT/ML ~~LOC~~ SUSP
22.0000 [IU] | Freq: Every day | SUBCUTANEOUS | Status: DC
  Filled 2024-04-29: qty 10

## 2024-04-29 MED ORDER — ASCORBIC ACID 500 MG PO TABS
500.0000 mg | ORAL_TABLET | Freq: Every day | ORAL | 2 refills | Status: AC
  Filled 2024-04-29: qty 30, 30d supply, fill #0

## 2024-04-29 MED ORDER — DAPTOMYCIN IV (FOR PTA / DISCHARGE USE ONLY): 700.0000 mg | INTRAVENOUS | 0 refills | Status: AC

## 2024-04-29 MED ORDER — POLYETHYLENE GLYCOL 3350 17 GM/SCOOP PO POWD
17.0000 g | Freq: Two times a day (BID) | ORAL | 0 refills | Status: DC
Start: 2024-04-29 — End: 2024-10-23
  Filled 2024-04-29: qty 238, 7d supply, fill #0

## 2024-04-29 MED ORDER — CYANOCOBALAMIN 1000 MCG PO TABS
1000.0000 ug | ORAL_TABLET | Freq: Every day | ORAL | 0 refills | Status: AC
  Filled 2024-04-29: qty 90, 90d supply, fill #0

## 2024-04-29 MED ORDER — POLYSACCHARIDE IRON COMPLEX 150 MG PO CAPS
150.0000 mg | ORAL_CAPSULE | Freq: Every day | ORAL | 0 refills | Status: DC
Start: 2024-04-30 — End: 2024-10-23
  Filled 2024-04-29: qty 90, 90d supply, fill #0

## 2024-04-29 MED ORDER — ACETAMINOPHEN 325 MG PO TABS: 650.0000 mg | ORAL_TABLET | Freq: Four times a day (QID) | ORAL | Status: DC | PRN

## 2024-04-29 MED ORDER — INSULIN ASPART 100 UNIT/ML FLEXPEN
0.0000 [IU] | PEN_INJECTOR | Freq: Three times a day (TID) | SUBCUTANEOUS | 11 refills | Status: DC
  Filled 2024-04-29: qty 15, 30d supply, fill #0
  Filled 2024-07-01 – 2024-08-11 (×2): qty 15, 30d supply, fill #1
  Filled 2024-08-13: qty 15, 30d supply, fill #0

## 2024-04-29 NOTE — TOC Transition Note (Signed)
 Transition of Care [facility]) - Discharge Note   Patient Details  Name: Tally Mckinnon MRN: [id number] Date of Birth: [birth year]  [facility] of [facility]) CM/SW Contact:  Alexandra Ice, RN Phone Number: 04/29/2024, 11:24 AM   Clinical Narrative:    Patient will discharge today, home with home health and IV infusion. Jen with [employer] to come and do teaching. She set up home health RN for PICC line dressing changes.    Final next level of care: Home w Home Health Services Barriers to Discharge: Barriers Resolved   Patient Goals and CMS Choice Patient states their goals for this hospitalization and ongoing recovery are:: go home          Discharge Placement                  Name of family member notified: Family Patient and family notified of of transfer: 04/29/24  Discharge Plan and Services Additional resources added to the After Visit Summary for                    DME Agency: NA       HH Arranged: RN, IV Antibiotics HH Agency: Other - See comment Water engineer) Date HH Agency Contacted: 04/29/24 Time HH Agency Contacted: 1123 Representative spoke with at [facility] Agency: Margo Shells  Social Drivers of Health (SDOH) Interventions SDOH Screenings   Food Insecurity: No Food Insecurity (04/18/2024)  Housing: Low Risk  (04/18/2024)  Transportation Needs: No Transportation Needs (04/18/2024)  Utilities: Not At Risk (04/18/2024)  Depression (PHQ2-9): Low Risk  (08/28/2023)  Financial Resource Strain: Patient Declined (06/28/2023)   Received from [facility]  Social Connections: Unknown (05/24/2022)   Received from [facility]  Tobacco Use: Medium Risk (04/23/2024)     Readmission Risk Interventions     No data to display

## 2024-04-29 NOTE — Progress Notes (Signed)
 Date of Admission:  04/17/2024      ID: [name] is a 58 y.o. male Principal Problem:   Diabetic foot infection (HCC) Active Problems:   Obesity (BMI 30-39.9)   Hypertension associated with diabetes (HCC)   HLD (hyperlipidemia)   Chronic kidney disease, stage 3a (HCC)   Type II diabetes mellitus with renal manifestations (HCC)   Normocytic anemia   MRSA bacteremia    Subjective: Pt doing better Was educated on administration IV antibiotics today   Medications:   vitamin C   500 mg Oral Daily   atorvastatin   20 mg Oral Daily   bisacodyl   10 mg Oral QHS   vitamin B-12  1,000 mcg Oral Daily   gabapentin   300 mg Oral BID   heparin   5,000 Units Subcutaneous Q8H   insulin  aspart  0-15 Units Subcutaneous TID WC   insulin  aspart  0-5 Units Subcutaneous QHS   insulin  aspart  3 Units Subcutaneous TID WC   [START ON 04/30/2024] insulin  NPH Human  22 Units Subcutaneous QAC breakfast   insulin  NPH Human  24 Units Subcutaneous QHS   iron  polysaccharides  150 mg Oral Daily   losartan  25 mg Oral Daily   polyethylene glycol  17 g Oral BID   Vitamin D  (Ergocalciferol )  50,000 Units Oral Q7 days    Objective: Vital signs in last 24 hours: Patient Vitals for the past 24 hrs:  BP Temp Temp src Pulse Resp SpO2  04/29/24 1456 112/70 (!) 97.5 F (36.4 C) -- 96 18 100 %  04/29/24 0735 125/73 98.1 F (36.7 C) Oral 84 18 98 %  04/29/24 0457 112/70 97.7 F (36.5 C) Oral 88 18 99 %  04/28/24 1948 133/85 97.7 F (36.5 C) Oral 87 19 99 %      PHYSICAL EXAM:  General: looks well Lungs: Clear to auscultation bilaterally. No Wheezing or Rhonchi. No rales. Heart: Regular rate and rhythm, no murmur, rub or gallop. Abdomen: Soft, non-tender,not distended. Bowel sounds normal. No masses Extremities:  Right foot dressing not removed     Left foot chronic wounds on the plantar aspect  Skin: No rashes or lesions. Or bruising    Lab Results    Latest Ref Rng & Units 04/29/2024     3:56 AM 04/28/2024    3:56 AM 04/27/2024    7:02 AM  CBC  WBC 4.0 - 10.5 K/uL 10.0  9.2  9.1   Hemoglobin 13.0 - 17.0 g/dL 9.0  8.2  7.8   Hematocrit 39.0 - 52.0 % 29.2  25.9  25.3   Platelets 150 - 400 K/uL 374  370  374        Latest Ref Rng & Units 04/29/2024    3:56 AM 04/28/2024    3:56 AM 04/27/2024    7:02 AM  CMP  Glucose 70 - 99 mg/dL 272  536  644   BUN 6 - 20 mg/dL 22  20  18    Creatinine 0.61 - 1.24 mg/dL 0.34  7.42  5.95   Sodium 135 - 145 mmol/L 139  136  137   Potassium 3.5 - 5.1 mmol/L 4.4  4.0  4.1   Chloride 98 - 111 mmol/L 103  103  100   CO2 22 - 32 mmol/L 27  23  28    Calcium  8.9 - 10.3 mg/dL 9.2  8.8  8.9       Microbiology: Blood culture from 04/16/2024 has resulted as MRSA in 1 out  of 4 bottles Surgical culture from the right foot has MRSA and Pseudomonas    Assessment/Plan: MRSA bacteremia Low bioburden The source of this is his right foot infection  Diabetic foot infection- right With osteo   Underwent TMA- pathology pending MRSA and Pseudomonas  and ESBL kleb in the culture Because of the chronicity of the wound at least present fo> r 2 weeks unclear how long he would have had the bacteremia but the bioburden was low 1/4 and time to positivity was 4 days and  2 d echo  reported no valvular endocarditis. The probability of endocarditis was low ( VIRSTA score is just 2 points as community acquired MRSA).Discussed with cardiology and as the 2 d echo was a good study there was no vegetation- No need for TEE Pt i on  Meropenem and Dapto.  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

## 2024-04-29 NOTE — Inpatient Diabetes Management (Signed)
 Inpatient Diabetes Program Recommendations  AACE/ADA: New Consensus Statement on Inpatient Glycemic Control (2015)  Target Ranges:  Prepandial:   less than 140 mg/dL      Peak postprandial:   less than 180 mg/dL (1-2 hours)      Critically ill patients:  140 - 180 mg/dL    Latest Reference Range & Units 04/28/24 07:29 04/28/24 12:04 04/28/24 16:32 04/28/24 21:26  Glucose-Capillary 70 - 99 mg/dL 161 (H)  3 units Novolog   22 units NPH Insulin   210 (H)  5 units Novolog   202 (H)  5 units Novolog   240 (H)  2 units Novolog   22 units NPH Insulin    (H): Data is abnormally high  Latest Reference Range & Units 04/29/24 07:33  Glucose-Capillary 70 - 99 mg/dL 096 (H)  (H): Data is abnormally high     Home DM Meds: NPH 22 units BID       Metformin  1000 mg BID   Current Orders: NPH Insulin  22 units BID     Novolog  Moderate Correction Scale/ SSI (0-15 units) TID AC + HS    MD- Note afternoon CBGs elevated yesterday.  CBG 219 this AM.  Please consider:  1. Increase PM dose of NPH Insulin  to 24 units at HS  2. Start low dose Novolog  Meal Coverage: Novolog  3 units TID with meals HOLD if pt NPO HOLD if pt eats <50% meals    --Will follow patient during hospitalization--  Langston Pippins RN, MSN, CDCES Diabetes Coordinator Inpatient Glycemic Control Team Team Pager: [phone] (8a-5p)

## 2024-04-29 NOTE — Discharge Summary (Signed)
 [facility] Discharge Summary   Patient: [name] UXL:[id number]  PCP: Joaquin Mulberry, MD  Date of admission: 04/17/2024   Date of discharge:  04/29/2024     Discharge Diagnoses:  Principal Problem:   Diabetic foot infection (HCC) Active Problems:   Hypertension associated with diabetes (HCC)   HLD (hyperlipidemia)   Chronic kidney disease, stage 3a (HCC)   Type II diabetes mellitus with renal manifestations (HCC)   Obesity (BMI 30-39.9)   Normocytic anemia   MRSA bacteremia   Admitted From: Home Disposition:  Home with [facility]  Recommendations for Outpatient Follow-up:  F/u PCP in 1 week F/u ID in few weeks F/u Podiatry in 1 week for postop check Follow up LABS/TEST: CBC, CMP and CK weekly   Follow-up Information     Anell Baptist, DPM Follow up on 05/13/2024.   Specialty: Podiatry Why: at 1145 Contact information: [address] [place] [address] [phone]                Diet recommendation: Cardiac and Carb modified diet  Activity: The patient is advised to gradually reintroduce usual activities, as tolerated  Discharge Condition: stable  Code Status: Full code   History of present illness: As per the H and P dictated on admission Hospital Course:  Juan Kissoon is a 58 y.o. male with medical history significant of hypertension, hyperlipidemia, diabetes mellitus, CKD-3A, former smoker, obesity, diabetic foot ulcer, who presents with right foot wound and right foot pain.   Patient was recently hospitalized from 4/22 - 4/25 due to diabetic foot infection with gangrenous change. Patient is s/p of right greater toe first ray amputation. Pt has been on Cipro  as cultures from 5/1 showed Pseudomonas, but his wound is getting worse, now has dehiscence of wound.  Patient has further pain which is constant, 7 out of 10 in severity, aching, nonradiating.  No fever or chills.  Patient does not have chest pain, cough, SOB.  No nausea, vomiting, diarrhea or  abdominal pain.  No symptoms of UTI.    Patient was seen by Dr. Francee Inch of ID today. She recommended patient to go to ED for MRI, IV antibiotic, Wound bedridment and cultures.  Dr. Fawn Hooks tooks many pictures of both feet (please see the picture in Dr. Sherryle Don note).   Data reviewed independently and ED Course: pt was found to have WBC 10.2, GFR> 60, lactic acid 0.9 --> 0.8, temperature normal, blood pressure 134/84, heart rate 100, RR 18, oxygen  saturation 97% on room air.  Patient is admitted to MedSurg bed as inpatient.  Dr. Althea Atkinson of podiatry is consulted by EDP.   MRI reviewed: Abscess and gas formation, possible osteomyelitis. second metatarsal, second proximal phalanx, and the base of second middle phalanx, concerning for osteomyelitis. base of third proximal phalanx and mild marrow edema of the head of third metatarsal, could reflect reactive marrow changes, however, osteomyelitis can not be excluded. Diffuse edema of the intrinsic musculature of the foot, concerning for myositis.     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 S/p I&D 5/16.   5/21 s/p right foot debridement with excision of remaining lesser dorsal and metatarsals, and Implantation antibiotic beads with vancomycin .   S/p  Zosyn , changed linezolid , to daptomycin  on 5/19 as per ID 5/20 DC'd Zosyn  and started meropenem. S/p PRN Zofran  for nausea, and Tylenol , morphine  and Percocet  for pain Blood cultures NGTD, ESR 109 and CRP 12.8 elevated. wound care consulted. On 5/16 tissue culture from the OR is growing MRSA, Klebsiella (ESBL) and Pseudomonas. Blood culture grew MRSA, repeat blood culture negative.  TTE negative for vegetations, LVEF 65 to 70%, grade 1 diastolic dysfunction. 5/22 patient was cleared by podiatry to discharge and follow-up as an outpatient ID consulted, PICC  line was inserted on 5/26 and patient was discharged on daptomycin  and meropenem , IV team was arranged for home.  End Date:05/07/24   # Hypertension associated with diabetes, patient was not on any medication. Started losartan  25 mg p.o. daily.  Monitor BP at home and follow with PCP to titrate meds accordingly.   # HLD (hyperlipidemia): Hold Lipitor until patient is on daptomycin .  Check CK level weekly as per ID.  Resume Lipitor when daptomycin  infusion is done. # Chronic kidney disease, stage 3a: Renal function stable.  # Type II diabetes mellitus with renal manifestations: Poorly controlled. A1c 3 weeks ago 13.1.  Patient taking metformin  and NPH insulin  22 units twice daily.  Patient received NPH insulin  22 units in a.m. and increased to 24 units in the p.m. today but patient is being discharged home patient was advised to monitor CBG at home and titrate insulin  dose accordingly.  Also prescribed NovoLog  sliding scale.  Continue diabetic diet and follow with PCP for further management as an outpatient.   # Vitamin D  deficiency: started vitamin D  50,000 units p.o. weekly, follow with PCP to repeat vitamin D  level after 3 to 6 months. # Vitamin B12 level 287, goal >400, started vitamin B12 1000 mcg IM injection during hospital stay followed by oral supplement. # Iron  deficiency anemia, transferrin saturation 9% Continue oral iron  supplementation.  Had colonoscopy in 2024 but had a poor prep.  He is to have a colonoscopy repeated this year, 2025 Follow-up with PCP to repeat iron  profile after 3 to 6 months Folate level within normal range. Low vitamin B12 as above   # Constipation, started laxatives. # Obesity class I Body mass index is 32.49 kg/m.  Interventions: Calorie restricted diet and daily exercise advised to lose body weight.  Lifestyle modification discussed.   Body mass index is 32.49 kg/m.  Nutrition Interventions:  Pain control  - [place]  Controlled Substance Reporting  System database could not be reviewed as website was not working.  - Oxycodone  7-day supply given. - Patient was instructed, not to drive, operate heavy machinery, perform activities at heights, swimming or participation in water activities or provide baby sitting services while on Pain, Sleep and Anxiety Medications; until his outpatient Physician has advised to do so again.  - Also recommended to not to take more than prescribed Pain, Sleep and Anxiety Medications.  Patient was seen by physical therapy, who recommended Home health, which was arranged. On the day of the discharge the patient's vitals were stable, and no other acute medical condition were reported by patient. the patient was felt safe to be discharge at Home with Home health.  Consultants: Podiatry, ID Procedures: S/p I&D 5/16.   5/21 s/p right foot debridement with excision of remaining lesser dorsal and metatarsals, and Implantation antibiotic beads with vancomycin .    Discharge Exam: General: Appear in no distress, no Rash; Oral Mucosa Clear, moist. Cardiovascular: S1 and S2 Present, no Murmur, Respiratory: normal respiratory effort, Bilateral Air entry present and no Crackles, no wheezes Abdomen: Bowel Sound present, Soft and no tenderness, no hernia Extremities: no Pedal edema, s/p  Right TMA, dressing CDI Neurology: alert and oriented to time, place, and person affect appropriate.  Filed Weights   04/17/24 1255 04/18/24 1443  Weight: 99.8 kg 99.8 kg   Vitals:   04/29/24 0457 04/29/24 0735  BP: 112/70 125/73  Pulse: 88 84  Resp: 18 18  Temp: 97.7 F (36.5 C) 98.1 F (36.7 C)  SpO2: 99% 98%    DISCHARGE MEDICATION: Allergies as of 04/29/2024   No Known Allergies      Medication List     PAUSE taking these medications    atorvastatin  20 MG tablet Wait to take this until your doctor or other care provider tells you to start again. Do not take atorvastatin  while on Daptomycin  (Intravenous antibiotic).    Commonly known as: LIPITOR Take 1 tablet (20 mg total) by mouth daily.       STOP taking these medications    amLODipine  5 MG tablet Commonly known as: NORVASC    ciprofloxacin  500 MG tablet Commonly known as: CIPRO        TAKE these medications    Accu-Chek Guide Test test strip Generic drug: glucose blood Use to check blood sugar 3 times daily.   Accu-Chek Guide w/Device Kit Use to check blood sugar 3 times daily.   Accu-Chek Softclix Lancets lancets Use to check blood sugar 3 times daily.   acetaminophen  325 MG tablet Commonly known as: TYLENOL  Take 2 tablets (650 mg total) by mouth every 6 (six) hours as needed for mild pain (pain score 1-3), fever or headache.   ascorbic acid  500 MG tablet Commonly known as: VITAMIN C  Take 1 tablet (500 mg total) by mouth daily. Start taking on: Apr 30, 2024   bisacodyl  5 MG EC tablet Commonly known as: DULCOLAX Take 2 tablets (10 mg total) by mouth at bedtime as needed for moderate constipation.   cyanocobalamin  1000 MCG tablet Take 1 tablet (1,000 mcg total) by mouth daily. Start taking on: Apr 30, 2024   daptomycin  IVPB Commonly known as: CUBICIN  Inject 700 mg into the vein daily for 8 days. Indication:  MRSA bacteremia and polymicrobial diabetic foot infection  First Dose: Yes Last Day of Therapy:  05/07/2024 Labs - Once weekly:  CBC/D, BMP, CPK, ESR and CRP Fax weekly lab results  promptly to [id number] Method of administration: IV Push Method of administration may be changed at the discretion of home infusion pharmacist based upon assessment of the patient and/or caregiver's ability to self-administer the medication ordered. Please leave PIC in place until doctor has seen patient or been notified Call [phone] with any critical value or questions   Dexcom G6 Transmitter Misc Use to check blood sugar three times daily. Change transmitter once every 909 days. E11.69   gabapentin  300 MG capsule Commonly  known as: NEURONTIN  Take 1 capsule (300 mg total) by mouth at bedtime.   HumuLIN  N KwikPen 100 UNIT/ML KwikPen Generic drug: Insulin  NPH (Human) (Isophane) Inject 22 Units into the skin 2 (two) times daily. What changed: additional instructions   insulin  aspart 100 UNIT/ML injection Commonly known as: novoLOG  Inject 0-15 Units into the skin 3 (three) times daily before meals. 0-15 Units, Subcutaneous, 3 times daily with meals, First dose on Sun 04/20/24 at 1200 Correction coverage: Moderate (average weight, post-op) CBG < 70: Implement Hypoglycemia Standing Orders and refer to Hypoglycemia Standing Orders sidebar report CBG 70 - 120: 0 units CBG 121 - 150: 2 units CBG 151 - 200: 3 units CBG 201 - 250: 5 units  CBG 251 - 300: 8 units CBG 301 - 350: 11 units CBG 351 - 400: 15 units CBG > 400: call MD and obtain STAT lab verification   Insulin  Pen Needle 32G X 4 MM Misc Use as directed to inject insulin  up to 4 times daily.   iron  polysaccharides 150 MG capsule Commonly known as: NIFEREX Take 1 capsule (150 mg total) by mouth daily. Start taking on: Apr 30, 2024   losartan 25 MG tablet Commonly known as: COZAAR Take 1 tablet (25 mg total) by mouth daily. Start taking on: Apr 30, 2024   meropenem IVPB Commonly known as: MERREM Inject 1 g into the vein every 8 (eight) hours for 8 days. Indication:  MRSA bacteremia and polymicrobial diabetic foot infection  First Dose: Yes Last Day of Therapy:  05/07/2024 Labs - Once weekly:  CBC/D, BMP, CPK, ESR and CRP Fax weekly lab results  promptly to [phone] Method of administration: Mini-Bag Plus / Gravity Method of administration may be changed at the discretion of home infusion pharmacist based upon assessment of the patient and/or caregiver's ability to self-administer the medication ordered. Please leave PIC in place until doctor has seen patient or been notified Call [phone] with any critical value or questions   metFORMIN   500 MG 24 hr tablet Commonly known as: GLUCOPHAGE -XR Take 2 tablets (1,000 mg total) by mouth 2 (two) times daily with a meal.   methocarbamol  750 MG tablet Commonly known as: ROBAXIN  Take 1 tablet (750 mg total) by mouth every 6 (six) hours as needed for muscle spasms.   nitroGLYCERIN  0.2 mg/hr patch Commonly known as: NITRODUR - Dosed in mg/24 hr Place 1 patch (0.2 mg total) onto the skin daily.   oxyCODONE -acetaminophen  5-325 MG tablet Commonly known as: PERCOCET/ROXICET Take 1 tablet by mouth every 8 (eight) hours as needed for up to 7 days. What changed: when to take this   polyethylene glycol 17 g packet Commonly known as: MIRALAX  / GLYCOLAX  Take 17 g by mouth 2 (two) times daily.   Vitamin D  (Ergocalciferol ) 1.25 MG (50000 UNIT) Caps capsule Commonly known as: DRISDOL  Take 1 capsule (50,000 Units total) by mouth every 7 (seven) days. Start taking on: May 03, 2024               Discharge Care Instructions  (From admission, onward)           Start     Ordered   04/29/24 0000  Change dressing on IV access line weekly and PRN  (Home infusion instructions - [facility] )        04/29/24 1049   04/29/24 0000  Discharge wound care:       Comments: As above   04/29/24 1049           No Known Allergies Discharge Instructions     [facility] pharmacist to adjust dose for Vancomycin , Aminoglycosides and other anti-infective therapies as requested by physician.   Complete by: As directed    [facility] to provide Cath Flo 2mg    Complete by: As directed    Administer for PICC line occlusion and as ordered by physician for other access device issues.   Anaphylaxis Kit: Provided to treat any anaphylactic reaction to the medication being provided to the patient if First Dose or when requested by physician   Complete by: As directed    Epinephrine 1mg /ml vial / amp: Administer 0.3mg  (0.78ml) subcutaneously once for moderate to  severe  anaphylaxis, nurse to call physician and pharmacy when reaction occurs and call 911 if needed for immediate care   Diphenhydramine 50mg /ml IV vial: Administer 25-50mg  IV/IM PRN for first dose reaction, rash, itching, mild reaction, nurse to call physician and pharmacy when reaction occurs   Sodium Chloride  0.9% NS 500ml IV: Administer if needed for hypovolemic blood pressure drop or as ordered by physician after call to physician with anaphylactic reaction   Call MD for:  difficulty breathing, headache or visual disturbances   Complete by: As directed    Call MD for:  extreme fatigue   Complete by: As directed    Call MD for:  persistant dizziness or light-headedness   Complete by: As directed    Call MD for:  persistant nausea and vomiting   Complete by: As directed    Call MD for:  redness, tenderness, or signs of infection (pain, swelling, redness, odor or green/yellow discharge around incision site)   Complete by: As directed    Call MD for:  severe uncontrolled pain   Complete by: As directed    Call MD for:  temperature >100.4   Complete by: As directed    Change dressing on IV access line weekly and PRN   Complete by: As directed    Diet - low sodium heart healthy   Complete by: As directed    Discharge instructions   Complete by: As directed    F/u PCP F/u ID F/u Podiatry   Discharge wound care:   Complete by: As directed    As above   Flush IV access with Sodium Chloride  0.9% and Heparin  10 units/ml or 100 units/ml   Complete by: As directed    Home infusion instructions - [facility]   Complete by: As directed    Instructions: Flush IV access with Sodium Chloride  0.9% and Heparin  10units/ml or 100units/ml   Change dressing on IV access line: Weekly and PRN   Instructions Cath Flo 2mg : Administer for PICC Line occlusion and as ordered by physician for other access device   [facility] pharmacist to adjust dose for: Vancomycin , Aminoglycosides  and other anti-infective therapies as requested by physician   Increase activity slowly   Complete by: As directed    Method of administration may be changed at the discretion of home infusion pharmacist based upon assessment of the patient and/or caregiver's ability to self-administer the medication ordered   Complete by: As directed        The results of significant diagnostics from this hospitalization (including imaging, microbiology, ancillary and laboratory) are listed below for reference.    Significant Diagnostic Studies: US  EKG SITE RITE Result Date: 04/25/2024 If Site Rite image not attached, placement could not be confirmed due to current cardiac rhythm.  DG MINI C-ARM IMAGE ONLY Result Date: 04/23/2024 There is no interpretation for this exam.  This order is for images obtained during a surgical procedure.  Please See "Surgeries" Tab for more information regarding the procedure.   ECHOCARDIOGRAM COMPLETE Result Date: 04/23/2024    ECHOCARDIOGRAM REPORT   Patient Name:   [name] Date of Exam: 04/22/2024 Medical Rec #:  [id number]  Height:       69.0 in Accession #:    [id number] Weight:       220.0 lb Date of Birth:  1966-07-26  BSA:          2.151 m Patient Age:    58 years   BP:  123/78 mmHg Patient Gender: M          HR:           89 bpm. Exam Location:  [facility] Procedure: 2D Echo, Cardiac Doppler and Color Doppler (Both Spectral and Color            Flow Doppler were utilized during procedure). Indications:     R78.81 Bacteremia  History:         Patient has no prior history of Echocardiogram examinations.                  Risk Factors:Diabetes.  Sonographer:     Brigid Canada RDCS Referring Phys:  [id number] Alica Inks Diagnosing Phys: Sammy Crisp MD IMPRESSIONS  1. Left ventricular ejection fraction, by estimation, is 65 to 70%. The left ventricle has normal function. The left ventricle has no regional wall motion abnormalities. There is mild left  ventricular hypertrophy. Left ventricular diastolic parameters are consistent with Grade I diastolic dysfunction (impaired relaxation).  2. Right ventricular systolic function is normal. The right ventricular size is normal.  3. The mitral valve is normal in structure. Trivial mitral valve regurgitation. No evidence of mitral stenosis.  4. The aortic valve is tricuspid. Aortic valve regurgitation is not visualized. No aortic stenosis is present.  5. There is borderline dilatation of the ascending aorta, measuring 37 mm.  6. The inferior vena cava is normal in size with greater than 50% respiratory variability, suggesting right atrial pressure of 3 mmHg. Conclusion(s)/Recommendation(s): No evidence of valvular vegetations on this transthoracic echocardiogram. Consider a transesophageal echocardiogram to exclude infective endocarditis if clinically indicated. FINDINGS  Left Ventricle: Left ventricular ejection fraction, by estimation, is 65 to 70%. The left ventricle has normal function. The left ventricle has no regional wall motion abnormalities. The left ventricular internal cavity size was normal in size. There is  mild left ventricular hypertrophy. Left ventricular diastolic parameters are consistent with Grade I diastolic dysfunction (impaired relaxation). Right Ventricle: The right ventricular size is normal. No increase in right ventricular wall thickness. Right ventricular systolic function is normal. Left Atrium: Left atrial size was normal in size. Right Atrium: Right atrial size was normal in size. Pericardium: There is no evidence of pericardial effusion. Mitral Valve: The mitral valve is normal in structure. Trivial mitral valve regurgitation. No evidence of mitral valve stenosis. Tricuspid Valve: The tricuspid valve is normal in structure. Tricuspid valve regurgitation is trivial. Aortic Valve: The aortic valve is tricuspid. Aortic valve regurgitation is not visualized. No aortic stenosis is present.  Aortic valve mean gradient measures 4.7 mmHg. Aortic valve peak gradient measures 8.7 mmHg. Aortic valve area, by VTI measures 2.62 cm. Pulmonic Valve: The pulmonic valve was normal in structure. Pulmonic valve regurgitation is trivial. No evidence of pulmonic stenosis. Aorta: The aortic root is normal in size and structure. There is borderline dilatation of the ascending aorta, measuring 37 mm. Pulmonary Artery: The pulmonary artery is of normal size. Venous: The inferior vena cava is normal in size with greater than 50% respiratory variability, suggesting right atrial pressure of 3 mmHg. IAS/Shunts: The interatrial septum was not well visualized.  LEFT VENTRICLE PLAX 2D LVIDd:         5.00 cm   Diastology LVIDs:         2.60 cm   LV e' medial:    7.40 cm/s LV PW:         1.30 cm   LV E/e' medial:  11.9 LV IVS:  1.30 cm   LV e' lateral:   8.20 cm/s LVOT diam:     2.20 cm   LV E/e' lateral: 10.8 LV SV:         73 LV SV Index:   34 LVOT Area:     3.80 cm  RIGHT VENTRICLE             IVC RV Basal diam:  3.20 cm     IVC diam: 1.30 cm RV S prime:     19.93 cm/s TAPSE (M-mode): 2.8 cm LEFT ATRIUM             Index        RIGHT ATRIUM           Index LA diam:        4.20 cm 1.95 cm/m   RA Area:     13.70 cm LA Vol (A2C):   73.7 ml 34.26 ml/m  RA Volume:   29.40 ml  13.67 ml/m LA Vol (A4C):   56.6 ml 26.31 ml/m LA Biplane Vol: 69.2 ml 32.17 ml/m  AORTIC VALVE AV Area (Vmax):    2.72 cm AV Area (Vmean):   2.69 cm AV Area (VTI):     2.62 cm AV Vmax:           147.58 cm/s AV Vmean:          102.300 cm/s AV VTI:            0.277 m AV Peak Grad:      8.7 mmHg AV Mean Grad:      4.7 mmHg LVOT Vmax:         105.67 cm/s LVOT Vmean:        72.367 cm/s LVOT VTI:          0.191 m LVOT/AV VTI ratio: 0.69  AORTA Ao Root diam: 3.80 cm Ao Asc diam:  3.70 cm MITRAL VALVE MV Area (PHT): 3.81 cm     SHUNTS MV Decel Time: 199 msec     Systemic VTI:  0.19 m MV E velocity: 88.30 cm/s   Systemic Diam: 2.20 cm MV A velocity:  118.00 cm/s MV E/A ratio:  0.75 Veryl Gottron End MD Electronically signed by Sammy Crisp MD Signature Date/Time: 04/23/2024/7:00:22 AM    Final    DG Foot Complete Right Result Date: 04/22/2024 CLINICAL DATA:  Right foot infection EXAM: RIGHT FOOT COMPLETE - 3+ VIEW COMPARISON:  04/17/2024 FINDINGS: Changes of amputation of the great toe at the level of the tarsal metatarsal joint. Antibiotic beads within the overlying soft tissues. No visible bone destruction, acute fracture, subluxation or dislocation. IMPRESSION: No acute bony abnormality. Electronically Signed   By: Janeece Mechanic M.D.   On: 04/22/2024 18:04   MR FOOT RIGHT WO CONTRAST Result Date: 04/17/2024 CLINICAL DATA:  Concern for osteomyelitis. Recent amputation of the right great toe on 03/26/2024. EXAM: MRI OF THE RIGHT FOREFOOT WITHOUT CONTRAST TECHNIQUE: Multiplanar, multisequence MR imaging of the right foot was performed. No intravenous contrast was administered. COMPARISON:  Right foot radiographs dated 04/17/2024. MRI of the right foot dated 03/25/2024. FINDINGS: Soft tissue/ Bones/Joint/Cartilage Status post interval amputation of the first ray to the level of the medial cuneiform. There is a large postsurgical soft tissue wound/defect extending along the medial dorsal midfoot through forefoot. Heterogenous fluid signal with foci of susceptibility artifact within the soft tissues overlying the distal cortex of the medial cuneiform measuring up to 2.9 x 2.0 cm, could reflect organizing phlegmonous change/abscess  formation and foci of soft tissue air, which may be postsurgical, however, gas-forming infection can not be entirely excluded. There is underlying periostitis and marrow edema of the medial cuneiform, which may be reactive, however, osteomyelitis can not be excluded. This wound extends deep and laterally just superficial to medial margin of the distal second metatarsal. There is T2/STIR hyperintense marrow signal abnormality with  corresponding confluent T1 hypointensity of the second metatarsal, second proximal phalanx, and the base of second middle phalanx, which could reflect reactive marrow changes, however, osteomyelitis can not be excluded. Marrow edema with subtle corresponding T1 hypointensity of the base of third proximal phalanx and mild marrow edema of the head of third metatarsal, could reflect reactive marrow changes, however, osteomyelitis can not be excluded. No significant joint effusion. Mild-to-moderate degenerative changes of second through fifth TMT joints. Ligaments Lisfranc ligament appears intact. Muscles and Tendons Diffuse edema of the intrinsic musculature of the foot, myositis can not be excluded. No significant tenosynovitis. IMPRESSION: 1. Status post interval amputation of the first ray to the level of the medial cuneiform. There is a large postsurgical soft tissue wound/defect extending along the medial dorsal midfoot through forefoot. Heterogenous fluid signal with foci of susceptibility artifact within the soft tissues overlying the distal cortex of the medial cuneiform measuring up to 2.9 x 2.0 cm, could reflect organizing phlegmonous change/abscess formation and foci of soft tissue air, which may be postsurgical, however, gas-forming infection can not be excluded. There is underlying periostitis and marrow edema of the medial cuneiform, osteomyelitis can not be excluded. 2. This wound extends deep and laterally just superficial to medial margin of the distal second metatarsal. There is T2/STIR hyperintense marrow signal abnormality with corresponding confluent T1 hypointensity of the second metatarsal, second proximal phalanx, and the base of second middle phalanx, concerning for osteomyelitis. 3. Marrow edema with subtle corresponding T1 hypointensity of the base of third proximal phalanx and mild marrow edema of the head of third metatarsal, could reflect reactive marrow changes, however, osteomyelitis can  not be excluded. 4. Diffuse edema of the intrinsic musculature of the foot, concerning for myositis. Electronically Signed   By: Mannie Seek M.D.   On: 04/17/2024 21:05   DG Foot Complete Right Result Date: 04/17/2024 CLINICAL DATA:  foot swelling, wound dehiscence. EXAM: RIGHT FOOT COMPLETE - 3+ VIEW COMPARISON:  03/25/2024. FINDINGS: Since the prior study, patient underwent amputation of great toe, at the level of tarsometatarsal joint region. There is air within the soft tissue in the surgical bed. No focal bone erosions. No acute fracture or dislocation. No aggressive osseous lesion. No significant arthritis of imaged joints. Calcaneal spur noted along the Achilles tendon and Plantar aponeurosis attachment sites. No focal soft tissue swelling. No radiopaque foreign bodies. IMPRESSION: *Interval amputation of great toe, at the level of tarsometatarsal joint region. There is air within the soft tissue in the surgical bed. No focal bone erosions. Electronically Signed   By: Beula Brunswick M.D.   On: 04/17/2024 13:28    Microbiology: Recent Results (from the past 240 hours)  Culture, blood (Routine X 2) w Reflex to ID Panel     Status: None   Collection Time: 04/22/24 12:11 AM   Specimen: BLOOD LEFT ARM  Result Value Ref Range Status   Specimen Description BLOOD LEFT ARM  Final   Special Requests   Final    BOTTLES DRAWN AEROBIC AND ANAEROBIC Blood Culture adequate volume   Culture   Final    NO GROWTH  5 DAYS Performed at [facility], [address] [place], [place] [address]    Report Status 04/27/2024 FINAL  Final  Culture, blood (Routine X 2) w Reflex to ID Panel     Status: None   Collection Time: 04/22/24 12:11 AM   Specimen: BLOOD LEFT HAND  Result Value Ref Range Status   Specimen Description BLOOD LEFT HAND  Final   Special Requests   Final    BOTTLES DRAWN AEROBIC AND ANAEROBIC Blood Culture adequate volume   Culture   Final    NO GROWTH 5 DAYS Performed at  [facility], [address], [place], [place] [address]    Report Status 04/27/2024 FINAL  Final  MRSA Next Gen by PCR, Nasal     Status: Abnormal   Collection Time: 04/22/24  4:09 PM   Specimen: Nasal Mucosa; Nasal Swab  Result Value Ref Range Status   MRSA by PCR Next Gen DETECTED (A) NOT DETECTED Final    Comment: RESULT CALLED TO, READ BACK BY AND VERIFIED WITH: Tanner Fanny [id number] LRL Performed at [facility], [address] [place], [place] [address]   Culture, blood (Routine X 2) w Reflex to ID Panel     Status: None   Collection Time: 04/24/24  9:22 PM   Specimen: BLOOD  Result Value Ref Range Status   Specimen Description BLOOD LEFT ANTECUBITAL  Final   Special Requests   Final    BOTTLES DRAWN AEROBIC AND ANAEROBIC Blood Culture adequate volume   Culture   Final    NO GROWTH 5 DAYS Performed at [facility], [address] [place], [place] [address]    Report Status 04/29/2024 FINAL  Final  Culture, blood (Routine X 2) w Reflex to ID Panel     Status: None   Collection Time: 04/24/24  9:24 PM   Specimen: BLOOD LEFT HAND  Result Value Ref Range Status   Specimen Description BLOOD LEFT HAND  Final   Special Requests   Final    BOTTLES DRAWN AEROBIC AND ANAEROBIC Blood Culture adequate volume   Culture   Final    NO GROWTH 5 DAYS Performed at [facility], [address] [place], [place] [address]    Report Status 04/29/2024 FINAL  Final     Labs: CBC: Recent Labs  Lab 04/25/24 0232 04/26/24 0742 04/27/24 0702 04/28/24 0356 04/29/24 0356  WBC 12.2* 10.2 9.1 9.2 10.0  HGB 7.8* 8.0* 7.8* 8.2* 9.0*  HCT 25.3* 26.3* 25.3* 25.9* 29.2*  MCV 83.0 82.2 81.1 80.4 81.3  PLT 365 408* 374 370 374   Basic Metabolic Panel: Recent Labs  Lab 04/25/24 0232 04/26/24 0742 04/27/24 0702 04/28/24 0356 04/29/24 0356  NA 139 137 137 136 139  K 4.5 4.3 4.1 4.0 4.4  CL 106 103 100 103 103  CO2 30 27 28 23 27   GLUCOSE  202* 236* 162* 149* 230*  BUN 14 18 18 20  22*  CREATININE 1.22 1.21 1.18 1.12 1.17  CALCIUM  8.5* 8.7* 8.9 8.8* 9.2  MG  --  2.0  --   --   --   PHOS  --  3.1  --   --   --    Liver Function Tests: No results for input(s): "AST", "ALT", "ALKPHOS", "BILITOT", "PROT", "ALBUMIN" in the last 168 hours. No results for input(s): "LIPASE", "AMYLASE" in the last 168 hours. No results for input(s): "AMMONIA" in the last 168 hours. Cardiac Enzymes: Recent  Labs  Lab 04/28/24 0356  CKTOTAL 45*   BNP (last 3 results) No results for input(s): "BNP" in the last 8760 hours. CBG: Recent Labs  Lab 04/28/24 1204 04/28/24 1632 04/28/24 2126 04/29/24 0733 04/29/24 1224  GLUCAP 210* 202* 240* 219* 239*    Time spent: 35 minutes  Signed:  Todrick Siedschlag  [facility] 04/29/2024 12:28 PM

## 2024-04-29 NOTE — Plan of Care (Signed)
  Problem: Education: Goal: Ability to describe self-care measures that may prevent or decrease complications (Diabetes Survival Skills Education) will improve Outcome: Adequate for Discharge Goal: Individualized Educational Video(s) Outcome: Adequate for Discharge   Problem: Coping: Goal: Ability to adjust to condition or change in health will improve Outcome: Adequate for Discharge   Problem: Fluid Volume: Goal: Ability to maintain a balanced intake and output will improve Outcome: Adequate for Discharge   Problem: Health Behavior/Discharge Planning: Goal: Ability to identify and utilize available resources and services will improve Outcome: Adequate for Discharge Goal: Ability to manage health-related needs will improve Outcome: Adequate for Discharge   Problem: Metabolic: Goal: Ability to maintain appropriate glucose levels will improve Outcome: Adequate for Discharge   Problem: Nutritional: Goal: Maintenance of adequate nutrition will improve Outcome: Adequate for Discharge Goal: Progress toward achieving an optimal weight will improve Outcome: Adequate for Discharge   Problem: Skin Integrity: Goal: Risk for impaired skin integrity will decrease Outcome: Adequate for Discharge   Problem: Tissue Perfusion: Goal: Adequacy of tissue perfusion will improve Outcome: Adequate for Discharge   Problem: Education: Goal: Knowledge of General Education information will improve Description: Including pain rating scale, medication(s)/side effects and non-pharmacologic comfort measures Outcome: Adequate for Discharge   Problem: Health Behavior/Discharge Planning: Goal: Ability to manage health-related needs will improve Outcome: Adequate for Discharge   Problem: Clinical Measurements: Goal: Ability to maintain clinical measurements within normal limits will improve Outcome: Adequate for Discharge Goal: Will remain free from infection Outcome: Adequate for Discharge Goal:  Diagnostic test results will improve Outcome: Adequate for Discharge Goal: Respiratory complications will improve Outcome: Adequate for Discharge Goal: Cardiovascular complication will be avoided Outcome: Adequate for Discharge   Problem: Activity: Goal: Risk for activity intolerance will decrease Outcome: Adequate for Discharge   Problem: Nutrition: Goal: Adequate nutrition will be maintained Outcome: Adequate for Discharge   Problem: Coping: Goal: Level of anxiety will decrease Outcome: Adequate for Discharge   Problem: Elimination: Goal: Will not experience complications related to bowel motility Outcome: Adequate for Discharge Goal: Will not experience complications related to urinary retention Outcome: Adequate for Discharge   Problem: Pain Managment: Goal: General experience of comfort will improve and/or be controlled Outcome: Adequate for Discharge   Problem: Safety: Goal: Ability to remain free from injury will improve Outcome: Adequate for Discharge   Problem: Skin Integrity: Goal: Risk for impaired skin integrity will decrease Outcome: Adequate for Discharge   Problem: Clinical Measurements: Goal: Ability to avoid or minimize complications of infection will improve Outcome: Adequate for Discharge   Problem: Skin Integrity: Goal: Skin integrity will improve Outcome: Adequate for Discharge

## 2024-04-29 NOTE — Plan of Care (Signed)
   Problem: Education: Goal: Knowledge of General Education information will improve Description Including pain rating scale, medication(s)/side effects and non-pharmacologic comfort measures Outcome: Progressing

## 2024-04-30 ENCOUNTER — Telehealth: Payer: Self-pay

## 2024-04-30 NOTE — Transitions of Care (Post Inpatient/ED Visit) (Signed)
   04/30/2024  Name: [name] MRN: [id number] DOB: 1966-11-30  Today's TOC FU Call Status: Today's TOC FU Call Status:: Unsuccessful Call (1st Attempt) Unsuccessful Call (1st Attempt) Date: 04/30/24  Attempted to reach the patient regarding the most recent Inpatient/ED visit.  Follow Up Plan: Additional outreach attempts will be made to reach the patient to complete the Transitions of Care (Post Inpatient/ED visit) call.    Katheryn Pandy MSN, RN RN Case Sales executive Health  [facility] Office Hours M-F [phone] Direct Dial: [phone] Main Phone [phone]  Fax: [phone] [email or web address]

## 2024-05-01 ENCOUNTER — Telehealth: Payer: Self-pay

## 2024-05-01 NOTE — Transitions of Care (Post Inpatient/ED Visit) (Addendum)
 05/01/2024  Name: [name] MRN: [id number] DOB: 02-14-1966  Today's TOC FU Call Status: Today's TOC FU Call Status:: Successful TOC FU Call Completed TOC FU Call Complete Date: 05/01/24 Patient's Name and Date of Birth confirmed.  Transition Care Management Follow-up Telephone Call Date of Discharge: 04/29/24 Discharge Facility: [facility]) Type of Discharge: Inpatient Admission How have you been since you were released from the hospital?: Better Any questions or concerns?: No  Items Reviewed: Did you receive and understand the discharge instructions provided?: No Medications obtained,verified, and reconciled?: Yes (Medications Reviewed) Any new allergies since your discharge?: No Dietary orders reviewed?: Yes Type of Diet Ordered:: Cardiac and Carb modified diet Do you have support at home?: Yes People in Home [RPT]: child(ren), adult Name of Support/Comfort Primary Source: Daughter  Medications Reviewed Today: Medications Reviewed Today     Reviewed by Areta Beer, RN (Case Manager) on 05/01/24 at 1548  Med List Status: <None>   Medication Order Taking? Sig Documenting Provider Last Dose Status Informant  Accu-Chek Softclix Lancets lancets [id number] Yes Use to check blood sugar 3 times daily. Newlin, Enobong, MD Taking Active Self  acetaminophen  (TYLENOL ) 325 MG tablet [id number] Yes Take 2 tablets (650 mg total) by mouth every 6 (six) hours as needed for mild pain (pain score 1-3), fever or headache. Althia Atlas, MD Taking Active   ascorbic acid  (VITAMIN C ) 500 MG tablet [id number] Yes Take 1 tablet (500 mg total) by mouth daily. Althia Atlas, MD Taking Active   atorvastatin  (LIPITOR) 20 MG tablet [id number] No Take 1 tablet (20 mg total) by mouth daily.  Patient not taking: Reported on 05/01/2024   Newlin, Enobong, MD Not Taking Active Self  bisacodyl  (DULCOLAX) 5 MG EC tablet [id number] Yes Take 2 tablets (10 mg total) by mouth at bedtime as  needed for moderate constipation. Althia Atlas, MD Taking Active   Blood Glucose Monitoring Suppl (ACCU-CHEK GUIDE) w/Device KIT [id number] Yes Use to check blood sugar 3 times daily. Newlin, Enobong, MD Taking Active Self  Continuous Blood Gluc Transmit (DEXCOM G6 TRANSMITTER) MISC [id number] No Use to check blood sugar three times daily. Change transmitter once every 909 days. E11.69  Patient not taking: Reported on 05/01/2024   Newlin, Enobong, MD Not Taking Active Self           Med Note [facility], RHONDA A   Mon Mar 31, 2024  4:06 PM) Patient states he has this but currently can't find it - plans to discuss at MD appointment tomorrow   cyanocobalamin  1000 MCG tablet [id number] Yes Take 1 tablet (1,000 mcg total) by mouth daily. Althia Atlas, MD Taking Active   daptomycin  (CUBICIN ) IVPB [id number] Yes Inject 700 mg into the vein daily for 8 days. 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 Please leave PIC in place until doctor has seen patient or been notified Call [phone] with any critical value or questions Althia Atlas, MD Taking Active   gabapentin  (NEURONTIN ) 300 MG capsule [id number] No Take 1 capsule (300 mg total) by mouth at bedtime.  Patient not taking: Reported on 05/01/2024   Newlin, Enobong,  MD Not Taking Active Self  glucose blood (ACCU-CHEK GUIDE TEST) test strip [id number] Yes Use to check blood sugar 3 times daily. Newlin, Enobong, MD Taking Active Self  insulin  aspart (NOVOLOG ) 100 UNIT/ML FlexPen [id number] Yes Inject 0-15 Units into the skin 3 (three) times daily before meals.   Correction coverage: Moderate (average weight, post-op) CBG 70 - 120: 0 units CBG 121 - 150: 2 units CBG 151 - 200: 3 units CBG 201 - 250: 5 units CBG 251 - 300: 8 units CBG 301 - 350: 11 units CBG 351 - 400: 15 units CBG > 400: call MD Althia Atlas, MD Taking Active   Insulin  NPH, Human,, Isophane, (HUMULIN  N KWIKPEN) 100 UNIT/ML Michaelyn Adu [id number] Yes Inject 22 Units into the skin 2 (two) times daily. Althia Atlas, MD Taking Active   Insulin  Pen Needle 32G X 4 MM MISC [id number] Yes Use as directed to inject insulin  up to 4 times daily. Newlin, Enobong, MD Taking Active Self  iron  polysaccharides (NIFEREX) 150 MG capsule [id number] Yes Take 1 capsule (150 mg total) by mouth daily. Althia Atlas, MD Taking Active   losartan (COZAAR) 25 MG tablet [id number] Yes Take 1 tablet (25 mg total) by mouth daily. Althia Atlas, MD Taking Active   meropenem [facility]) IVPB [id number] Yes Inject 1 g into the vein every 8 (eight) hours for 8 days. 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 Please leave PIC in place until doctor has seen patient or been notified Call [phone] with any critical value or questions Althia Atlas, MD Taking Active   metFORMIN  (GLUCOPHAGE -XR) 500 MG 24 hr tablet [id number] No Take 2 tablets (1,000 mg total) by mouth 2 (two) times daily with a meal.  Patient not taking: Reported on 05/01/2024   Samtani, Jai-Gurmukh, MD Not Taking Active Self           Med Note Parke Boll, Derick Fleeting May 01, 2024  3:45 PM) Needs to ask PCP about restarting this as was told in  hospital it interacted it with IV antibiotics.   methocarbamol  (ROBAXIN ) 750 MG tablet [id number] No Take 1 tablet (750 mg total) by mouth every 6 (six) hours as needed for muscle spasms.  Patient not taking: Reported on 05/01/2024   Samtani, Jai-Gurmukh, MD Not Taking Active Self           Med Note Parke Boll, Derick Fleeting May 01, 2024  3:47 PM) Not needing so far.   nitroGLYCERIN  (NITRODUR - DOSED IN MG/24 HR) 0.2 mg/hr patch [id number] Yes Place 1 patch (0.2 mg total) onto the skin daily. Timothy Ford, MD Taking Active Self  oxyCODONE -acetaminophen  (PERCOCET/ROXICET) 5-325 MG tablet [id number] Yes Take 1 tablet by mouth every 8 (eight) hours as needed for up to 7 days. Althia Atlas, MD Taking Active   polyethylene glycol powder (GLYCOLAX /MIRALAX ) 17 GM/SCOOP powder [id number] No Take 17 g by mouth 2 (two) times daily. Mix as directed.  Patient not taking: Reported on 05/01/2024   Althia Atlas, MD Not Taking Active            Med Note (Jaylani Mcguinn,  Morrell Aran   Thu May 01, 2024  3:48 PM) Has not taken yet but had it on hand.   Vitamin D , Ergocalciferol , (DRISDOL ) 1.25 MG (50000 UNIT) CAPS capsule [id number] Yes Take 1 capsule (50,000 Units total) by mouth every 7 (seven) days. Althia Atlas, MD Taking Active             Home Care and Equipment/Supplies: Were Home Health Services Ordered?: Yes Name of Home Health Agency::  [id number] [facility].) Has Agency set up a time to come to your home?: Yes First Home Health Visit Date:  (IV dose every Thursday. First dose was today patient stated.) Any new equipment or medical supplies ordered?: No  Functional Questionnaire: Do you need assistance with bathing/showering or dressing?: No Do you need assistance with meal preparation?: No Do you need assistance with eating?: No Do you have difficulty maintaining continence: No Do you need assistance with getting out of bed/getting out of a chair/moving?: No Do you have difficulty managing  or taking your medications?: No  Follow up appointments reviewed: PCP Follow-up appointment confirmed?:  (Patient will call PCP regarding a medications question and a HFU appointment after this call.) MD Provider Line Number:[phone] Given: No Specialist Hospital Follow-up appointment confirmed?: Yes Date of Specialist follow-up appointment?: 05/06/24 Follow-Up Specialty Provider:: Dr. Jimmye Moulds Do you need transportation to your follow-up appointment?: No Do you understand care options if your condition(s) worsen?: Yes-patient verbalized understanding  SDOH Interventions Today    Flowsheet Row Most Recent Value  SDOH Interventions   Food Insecurity Interventions Intervention Not Indicated  Housing Interventions Intervention Not Indicated  Transportation Interventions Intervention Not Indicated  Utilities Interventions Intervention Not Indicated  Social Connections Interventions Intervention Not Indicated  Health Literacy Interventions Intervention Not Indicated     Patient states he is doing really well since discharge and 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   [facility] came today for first IV antibiotic dose and PICC Line care.  Reviewed what to call provider for and signs or symptoms of possible infection or leakage in PICC line.  Reviewed follow up appointments and is gong to call PCP for HFU visit after this call and a medication related question that came up during Medications reconciliation on whether to start taking Metformin  yet. Patient stated he was told it would interfere with or not be active with IV antibiotics.  Checking Blood Sugars and keeping a log. Blood sugar today was 115.  Reviewed diet instructions.  Reviewed follow up appointments.   Patient Declined need for 30 day program/weekly calls.   Katheryn Pandy MSN, RN RN Case Sales executive Health  [facility] Office Hours M-F  [phone] Direct Dial: [phone] Main Phone [phone]  Fax: [phone] [email or web address]

## 2024-05-02 LAB — CULTURE, BLOOD (ROUTINE X 2): Special Requests: ADEQUATE

## 2024-05-06 ENCOUNTER — Inpatient Hospital Stay: Admitting: Infectious Diseases

## 2024-05-06 ENCOUNTER — Telehealth: Payer: Self-pay

## 2024-05-06 NOTE — Telephone Encounter (Signed)
 Patient missed appointment today due to a death in the family and he is currently in [place].  Per Dr. Francee Inch she would like to extend patient's IV abx until 610/25, which is when he will follow up with Dr. Althea Atkinson (podiatry). Dr. Althea Atkinson will determine if he needs to be seen by [facility] or Dr.Fitzgerald) since Dr. Francee Inch is away and if IV abx can end.  I will follow up with Dr. Althea Atkinson after the patient is seen to give verbal order to [facility] for IV abx.  Verba orders given to [place] with [facility] for IV abx extension.  Patient aware of treatment plan as well   [facility] [phone] Loreen Bankson Adel Holt, CMA

## 2024-05-08 NOTE — Telephone Encounter (Signed)
 Patient called stating he was not going to return from [place] until Tuesday and is due for picc dressing change today.  I have attempted to reach several infusion companies near him to try to arrange picc dressing change outpatient. I have stressed to the patient that he can not wait to long to have the dressing change due to possible infection.  I attempted to reach the patient to advise him to go to [facility] to have dressing changed. I spoke with their ED and they are will do it there, but patient may need to wait in the ED unsure of how long.  Patient did not have VM setup.  [name], CMA   [facility] [address], [place], [place] [address] [phone] Lantz Hermann Adel [name], [place]

## 2024-05-11 ENCOUNTER — Ambulatory Visit (HOSPITAL_COMMUNITY)
Admission: EM | Admit: 2024-05-11 | Discharge: 2024-05-12 | Disposition: A | Discharge status: Home / Self Care | Attending: Psychiatry | Admitting: Psychiatry

## 2024-05-11 DIAGNOSIS — L97429 Non-pressure chronic ulcer of left heel and midfoot with unspecified severity: Secondary | ICD-10-CM | POA: Insufficient documentation

## 2024-05-11 DIAGNOSIS — Z794 Long term (current) use of insulin: Secondary | ICD-10-CM | POA: Insufficient documentation

## 2024-05-11 DIAGNOSIS — F1994 Other psychoactive substance use, unspecified with psychoactive substance-induced mood disorder: Secondary | ICD-10-CM

## 2024-05-11 DIAGNOSIS — Z560 Unemployment, unspecified: Secondary | ICD-10-CM | POA: Diagnosis not present

## 2024-05-11 DIAGNOSIS — F1420 Cocaine dependence, uncomplicated: Secondary | ICD-10-CM

## 2024-05-11 DIAGNOSIS — E1165 Type 2 diabetes mellitus with hyperglycemia: Secondary | ICD-10-CM | POA: Insufficient documentation

## 2024-05-11 DIAGNOSIS — Z89431 Acquired absence of right foot: Secondary | ICD-10-CM | POA: Insufficient documentation

## 2024-05-11 DIAGNOSIS — Z87891 Personal history of nicotine dependence: Secondary | ICD-10-CM | POA: Diagnosis not present

## 2024-05-11 DIAGNOSIS — R9431 Abnormal electrocardiogram [ECG] [EKG]: Secondary | ICD-10-CM | POA: Insufficient documentation

## 2024-05-11 DIAGNOSIS — F1414 Cocaine abuse with cocaine-induced mood disorder: Secondary | ICD-10-CM | POA: Insufficient documentation

## 2024-05-11 DIAGNOSIS — E11621 Type 2 diabetes mellitus with foot ulcer: Secondary | ICD-10-CM | POA: Insufficient documentation

## 2024-05-11 DIAGNOSIS — T8781 Dehiscence of amputation stump: Secondary | ICD-10-CM | POA: Insufficient documentation

## 2024-05-11 LAB — COMPREHENSIVE METABOLIC PANEL WITH GFR
ALT: 12 U/L (ref 0–44)
AST: 12 U/L — ABNORMAL LOW (ref 15–41)
Albumin: 3.5 g/dL (ref 3.5–5.0)
Alkaline Phosphatase: 84 U/L (ref 38–126)
Anion gap: 11 (ref 5–15)
BUN: 25 mg/dL — ABNORMAL HIGH (ref 6–20)
CO2: 25 mmol/L (ref 22–32)
Calcium: 8.9 mg/dL (ref 8.9–10.3)
Chloride: 96 mmol/L — ABNORMAL LOW (ref 98–111)
Creatinine, Ser: 1.69 mg/dL — ABNORMAL HIGH (ref 0.61–1.24)
GFR, Estimated: 47 mL/min — ABNORMAL LOW (ref >60)
Glucose, Bld: 168 mg/dL — ABNORMAL HIGH (ref 70–99)
Potassium: 3.8 mmol/L (ref 3.5–5.1)
Sodium: 132 mmol/L — ABNORMAL LOW (ref 135–145)
Total Bilirubin: 0.8 mg/dL (ref 0.0–1.2)
Total Protein: 7.4 g/dL (ref 6.5–8.1)

## 2024-05-11 LAB — CBC WITH DIFFERENTIAL/PLATELET
Abs Immature Granulocytes: 0.03 10*3/uL (ref 0.00–0.07)
Basophils Absolute: 0 10*3/uL (ref 0.0–0.1)
Basophils Relative: 0 %
Eosinophils Absolute: 0.2 10*3/uL (ref 0.0–0.5)
Eosinophils Relative: 2 %
HCT: 29.6 % — ABNORMAL LOW (ref 39.0–52.0)
Hemoglobin: 9.2 g/dL — ABNORMAL LOW (ref 13.0–17.0)
Immature Granulocytes: 0 %
Lymphocytes Relative: 29 %
Lymphs Abs: 2.8 10*3/uL (ref 0.7–4.0)
MCH: 25.6 pg — ABNORMAL LOW (ref 26.0–34.0)
MCHC: 31.1 g/dL (ref 30.0–36.0)
MCV: 82.2 fL (ref 80.0–100.0)
Monocytes Absolute: 0.9 10*3/uL (ref 0.1–1.0)
Monocytes Relative: 9 %
Neutro Abs: 5.8 10*3/uL (ref 1.7–7.7)
Neutrophils Relative %: 60 %
Platelets: 345 10*3/uL (ref 150–400)
RBC: 3.6 MIL/uL — ABNORMAL LOW (ref 4.22–5.81)
RDW: 15 % (ref 11.5–15.5)
WBC: 9.7 10*3/uL (ref 4.0–10.5)
nRBC: 0 % (ref 0.0–0.2)

## 2024-05-11 LAB — LIPID PANEL
Cholesterol: 111 mg/dL (ref 0–200)
HDL: 28 mg/dL — ABNORMAL LOW (ref >40)
LDL Cholesterol: 62 mg/dL (ref 0–99)
Total CHOL/HDL Ratio: 4 ratio
Triglycerides: 106 mg/dL (ref <150)
VLDL: 21 mg/dL (ref 0–40)

## 2024-05-11 LAB — TSH: TSH: 1.045 u[IU]/mL (ref 0.350–4.500)

## 2024-05-11 LAB — ETHANOL: Alcohol, Ethyl (B): <15 mg/dL (ref <15)

## 2024-05-11 MED ORDER — POLYETHYLENE GLYCOL 3350 17 G PO PACK: 17.0000 g | PACK | Freq: Every day | ORAL | Status: DC | PRN

## 2024-05-11 MED ORDER — ALUM & MAG HYDROXIDE-SIMETH 200-200-20 MG/5ML PO SUSP
30.0000 mL | ORAL | Status: DC | PRN
Start: 2024-05-11 — End: 2024-05-12

## 2024-05-11 MED ORDER — LORAZEPAM 2 MG/ML IJ SOLN: 2.0000 mg | Freq: Three times a day (TID) | INTRAMUSCULAR | Status: DC | PRN

## 2024-05-11 MED ORDER — DIPHENHYDRAMINE HCL 50 MG/ML IJ SOLN: 50.0000 mg | Freq: Three times a day (TID) | INTRAMUSCULAR | Status: DC | PRN

## 2024-05-11 MED ORDER — HALOPERIDOL LACTATE 5 MG/ML IJ SOLN: 10.0000 mg | Freq: Three times a day (TID) | INTRAMUSCULAR | Status: DC | PRN

## 2024-05-11 MED ORDER — ACETAMINOPHEN 325 MG PO TABS: 650.0000 mg | ORAL_TABLET | Freq: Four times a day (QID) | ORAL | Status: DC | PRN

## 2024-05-11 MED ORDER — HALOPERIDOL 5 MG PO TABS: 5.0000 mg | ORAL_TABLET | Freq: Three times a day (TID) | ORAL | Status: DC | PRN

## 2024-05-11 MED ORDER — MAGNESIUM HYDROXIDE 400 MG/5ML PO SUSP
30.0000 mL | Freq: Every day | ORAL | Status: DC | PRN
  Administered 2024-05-11: 30 mL via ORAL
  Filled 2024-05-11: qty 30

## 2024-05-11 MED ORDER — TRAZODONE HCL 50 MG PO TABS
50.0000 mg | ORAL_TABLET | Freq: Every evening | ORAL | Status: DC | PRN
  Administered 2024-05-11: 50 mg via ORAL
  Filled 2024-05-11: qty 1

## 2024-05-11 MED ORDER — DIPHENHYDRAMINE HCL 50 MG PO CAPS: 50.0000 mg | ORAL_CAPSULE | Freq: Three times a day (TID) | ORAL | Status: DC | PRN

## 2024-05-11 MED ORDER — HYDROXYZINE HCL 25 MG PO TABS
25.0000 mg | ORAL_TABLET | Freq: Three times a day (TID) | ORAL | Status: DC | PRN
  Administered 2024-05-11: 25 mg via ORAL
  Filled 2024-05-11: qty 1

## 2024-05-11 MED ORDER — HALOPERIDOL LACTATE 5 MG/ML IJ SOLN
5.0000 mg | Freq: Three times a day (TID) | INTRAMUSCULAR | Status: DC | PRN
Start: 2024-05-11 — End: 2024-05-12

## 2024-05-11 NOTE — ED Provider Notes (Signed)
 [facility] Continuous Assessment Admission H&P  Date: 05/12/24 Patient Name: Kanishk Stroebel MRN: [id number] Chief Complaint: I need help with cocaine use and depression"  Diagnoses:  Final diagnoses:  Cocaine abuse (HCC)  Cocaine abuse with cocaine-induced mood disorder [facility])    HPI: Wilmore Holsomback is a 58 year old male with a psychiatric history of cocaine abuse  and medical hx of hypertension, hyperlipidemia, diabetes mellitus, CKD-3A, former smoker, obesity, diabetic foot ulcer. He [facility] to the [facility]) accompanied by his brother requesting substance abuse treatment.   This NP assessed patient face to face and reviewed his chart. On assessment, he reports a 20-year history of crack cocaine use and describes himself as having been a "functioning addict" for many years, but now states, "my life is a mess." He acknowledges significant difficulty maintaining sobriety and is currently unemployed, attributing this directly to his substance use. He reports that his life is spiraling out of control and recently stole money from his elderly mother to purchase drugs, which he says has caused intense guilt and prompted him to seek help. He describes feeling depressed as a result of his ongoing substance use and endorses several depressive symptoms, including hopelessness, helplessness, worthlessness, guilt, anhedonia, social isolation, and poor sleep.   He acknowledges his diabetes in unmanaged, which he admits he often neglects his health in favor of continued drug use. 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 He states he recently completed a course of IV antibiotics at home and still has a PICC line in his right arm. Patient says he wants to get sober so he can be present for his grandchildren.  Patient is alert and oriented x4. He was tearful throughout the assessment and appeared emotionally distressed  when discussing his substance use and its impact on his life and family. Mood is depressed with congruent affect. Thought processes are linear and goal-directed. He denies suicidal or homicidal ideation. He reports experiencing auditory and visual hallucinations only when actively using cocaine but denies perceptual disturbances when sober. Insight and judgment appear fair as he acknowledge the consequences of his substance use.  Total Time spent with patient: 30 minutes  Musculoskeletal  Strength & Muscle Tone: within normal limits Gait & Station: unsteady Patient leans: Right  Psychiatric Specialty Exam  Presentation General Appearance:  Appropriate for Environment  Eye Contact: Good  Speech: Clear and Coherent  Speech Volume: Normal  Handedness: Right   Mood and Affect  Mood: Depressed  Affect: Tearful   Thought Process  Thought Processes: Coherent; Goal Directed  Descriptions of Associations:Intact  Orientation:Full (Time, Place and Person)  Thought Content:WDL    Hallucinations:Hallucinations: None  Ideas of Reference:None  Suicidal Thoughts:Suicidal Thoughts: Yes, Passive SI Passive Intent and/or Plan: Without Plan; Without Intent  Homicidal Thoughts:Homicidal Thoughts: No   Sensorium  Memory: Immediate Good; Recent Good; Remote Good  Judgment: Fair  Insight: Good   Executive Functions  Concentration: Good  Attention Span: Good  Recall: Good  Fund of Knowledge: Good  Language: Good   Psychomotor Activity  Psychomotor Activity: Psychomotor Activity: Normal   Assets  Assets: Communication Skills; Desire for Improvement; Housing; Social Support   Sleep  Sleep: Sleep: Fair Number of Hours of Sleep: 4   Nutritional Assessment (For OBS and FBC admissions only) Has the patient had a weight loss or gain of 10 pounds or more in the last 3 months?: No Has  the patient had a decrease in food intake/or appetite?: No Does  the patient have dental problems?: No Does the patient have eating habits or behaviors that may be indicators of an eating disorder including binging or inducing vomiting?: No Has the patient recently lost weight without trying?: 0 Has the patient been eating poorly because of a decreased appetite?: 0 Malnutrition Screening Tool Score: 0    Physical Exam Vitals and nursing note reviewed.  Constitutional:      General: He is not in acute distress.    Appearance: He is well-developed. He is not ill-appearing, toxic-appearing or diaphoretic.  HENT:     Head: Normocephalic and atraumatic.  Eyes:     Conjunctiva/sclera: Conjunctivae normal.  Cardiovascular:     Rate and Rhythm: Normal rate.  Pulmonary:     Effort: No respiratory distress.  Abdominal:     Palpations: Abdomen is soft.     Tenderness: There is no abdominal tenderness.  Musculoskeletal:     Cervical back: Neck supple.     Comments: Wound to Left foot and Right foot. Right foot (recent partial amputation)  Neurological:     Mental Status: He is alert and oriented to person, place, and time.  Psychiatric:        Attention and Perception: Attention and perception normal.        Mood and Affect: Mood is depressed. Affect is tearful.        Speech: Speech normal.        Behavior: Behavior normal. Behavior is cooperative.        Thought Content: Thought content normal.    Review of Systems  Constitutional: Negative.   HENT: Negative.    Eyes: Negative.   Respiratory: Negative.    Cardiovascular: Negative.   Gastrointestinal: Negative.   Genitourinary: Negative.   Musculoskeletal: Negative.   Skin: Negative.   Neurological: Negative.   Endo/Heme/Allergies: Negative.   Psychiatric/Behavioral:  Positive for depression, hallucinations and substance abuse. The patient is nervous/anxious.     Blood pressure 104/60, pulse 99, temperature 98.9 F (37.2 C), temperature source Oral, resp. rate 20, SpO2 99%. There is no  height or weight on file to calculate BMI.  Past Psychiatric History:  Cocaine abuse   Is the patient at risk to self? No  Has the patient been a risk to self in the past 6 months? No .    Has the patient been a risk to self within the distant past? No   Is the patient a risk to others? No   Has the patient been a risk to others in the past 6 months? No   Has the patient been a risk to others within the distant past? No   Past Medical History:  Past Medical History:  Diagnosis Date   Diabetes mellitus without complication (HCC)    Tuberculosis    tested positive,treated with medications.     Family History:  Family History  Problem Relation Age of Onset   Colon cancer Neg Hx    Colon polyps Neg Hx    Crohn's disease Neg Hx    Esophageal cancer Neg Hx    Rectal cancer Neg Hx    Stomach cancer Neg Hx    Ulcerative colitis Neg Hx      Social History:  Social History   Tobacco Use   Smoking status: Former    Current packs/day: 0.00    Average packs/day: 0.3 packs/day for 25.0 years (6.3 ttl pk-yrs)  Types: Cigarettes    Start date: 09/17/1997    Quit date: 09/17/2022    Years since quitting: 1.6   Smokeless tobacco: Never   Tobacco comments:    "Quit"  Vaping Use   Vaping status: Never Used  Substance Use Topics   Alcohol use: No   Drug use: No     Last Labs:  Admission on 05/11/2024  Component Date Value Ref Range Status   POC Amphetamine UR 05/12/2024 None Detected  NONE DETECTED (Cut Off Level 1000 ng/mL) Final   POC Secobarbital (BAR) 05/12/2024 None Detected  NONE DETECTED (Cut Off Level 300 ng/mL) Final   POC Buprenorphine (BUP) 05/12/2024 None Detected  NONE DETECTED (Cut Off Level 10 ng/mL) Final   POC Oxazepam (BZO) 05/12/2024 None Detected  NONE DETECTED (Cut Off Level 300 ng/mL) Final   POC Cocaine UR 05/12/2024 Positive (A)  NONE DETECTED (Cut Off Level 300 ng/mL) Final   POC Methamphetamine UR 05/12/2024 None Detected  NONE DETECTED (Cut Off  Level 1000 ng/mL) Final   POC Morphine  05/12/2024 None Detected  NONE DETECTED (Cut Off Level 300 ng/mL) Final   POC Methadone UR 05/12/2024 None Detected  NONE DETECTED (Cut Off Level 300 ng/mL) Final   POC Oxycodone  UR 05/12/2024 None Detected  NONE DETECTED (Cut Off Level 100 ng/mL) Final   POC Marijuana UR 05/12/2024 None Detected  NONE DETECTED (Cut Off Level 50 ng/mL) Final   WBC 05/11/2024 9.7  4.0 - 10.5 K/uL Final   RBC 05/11/2024 3.60 (L)  4.22 - 5.81 MIL/uL Final   Hemoglobin 05/11/2024 9.2 (L)  13.0 - 17.0 g/dL Final   HCT 16/09/9603 29.6 (L)  39.0 - 52.0 % Final   MCV 05/11/2024 82.2  80.0 - 100.0 fL Final   MCH 05/11/2024 25.6 (L)  26.0 - 34.0 pg Final   MCHC 05/11/2024 31.1  30.0 - 36.0 g/dL Final   RDW 54/08/8118 15.0  11.5 - 15.5 % Final   Platelets 05/11/2024 345  150 - 400 K/uL Final   nRBC 05/11/2024 0.0  0.0 - 0.2 % Final   Neutrophils Relative % 05/11/2024 60  % Final   Neutro Abs 05/11/2024 5.8  1.7 - 7.7 K/uL Final   Lymphocytes Relative 05/11/2024 29  % Final   Lymphs Abs 05/11/2024 2.8  0.7 - 4.0 K/uL Final   Monocytes Relative 05/11/2024 9  % Final   Monocytes Absolute 05/11/2024 0.9  0.1 - 1.0 K/uL Final   Eosinophils Relative 05/11/2024 2  % Final   Eosinophils Absolute 05/11/2024 0.2  0.0 - 0.5 K/uL Final   Basophils Relative 05/11/2024 0  % Final   Basophils Absolute 05/11/2024 0.0  0.0 - 0.1 K/uL Final   Immature Granulocytes 05/11/2024 0  % Final   Abs Immature Granulocytes 05/11/2024 0.03  0.00 - 0.07 K/uL Final   Performed at [facility], [address] [place], [place] [address]   Sodium 05/11/2024 132 (L)  135 - 145 mmol/L Final   Potassium 05/11/2024 3.8  3.5 - 5.1 mmol/L Final   Chloride 05/11/2024 96 (L)  98 - 111 mmol/L Final   CO2 05/11/2024 25  22 - 32 mmol/L Final   Glucose, Bld 05/11/2024 168 (H)  70 - 99 mg/dL Final   Glucose reference range applies only to samples taken after fasting for at least 8 hours.   BUN 05/11/2024 25 (H)   6 - 20 mg/dL Final   Creatinine, Ser 05/11/2024 1.69 (H)  0.61 -  1.24 mg/dL Final   Calcium  05/11/2024 8.9  8.9 - 10.3 mg/dL Final   Total Protein 16/09/9603 7.4  6.5 - 8.1 g/dL Final   Albumin 54/08/8118 3.5  3.5 - 5.0 g/dL Final   AST 14/78/2956 12 (L)  15 - 41 U/L Final   ALT 05/11/2024 12  0 - 44 U/L Final   Alkaline Phosphatase 05/11/2024 84  38 - 126 U/L Final   Total Bilirubin 05/11/2024 0.8  0.0 - 1.2 mg/dL Final   GFR, Estimated 05/11/2024 47 (L)  >60 mL/min Final   Comment: (NOTE) Calculated using the CKD-EPI Creatinine Equation (2021)    Anion gap 05/11/2024 11  5 - 15 Final   Performed at [facility], [address], [place], [place] [address]   Alcohol, Ethyl (B) 05/11/2024 <15  <15 mg/dL Final   Comment: (NOTE) For medical purposes only. Performed at [facility], [address], [place], [place] [address]    Cholesterol 05/11/2024 111  0 - 200 mg/dL Final   Triglycerides 69/62/9528 106  <150 mg/dL Final   HDL 41/32/4401 28 (L)  >40 mg/dL Final   Total CHOL/HDL Ratio 05/11/2024 4.0  RATIO Final   VLDL 05/11/2024 21  0 - 40 mg/dL Final   LDL Cholesterol 05/11/2024 62  0 - 99 mg/dL Final   Comment:        Total Cholesterol/HDL:CHD Risk Coronary Heart Disease Risk Table                     Men   Women  1/2 Average Risk   3.4   3.3  Average Risk       5.0   4.4  2 X Average Risk   9.6   7.1  3 X Average Risk  23.4   11.0        Use the calculated Patient Ratio above and the CHD Risk Table to determine the patient's CHD Risk.        ATP III CLASSIFICATION (LDL):  <100     mg/dL   Optimal  027-253  mg/dL   Near or Above                    Optimal  130-159  mg/dL   Borderline  664-403  mg/dL   High  >474     mg/dL   Very High Performed at [facility], [address], [place], [place] [address]    TSH 05/11/2024 1.045  0.350 - 4.500 uIU/mL Final   Comment: Performed by a 3rd Generation assay with a functional sensitivity of <=0.01  uIU/mL. Performed at [facility], [address], [place], [place] [address]   No results displayed because visit has over 200 results.    Hospital Outpatient Visit on 04/03/2024  Component Date Value Ref Range Status   Sodium 04/03/2024 134 (L)  135 - 145 mmol/L Final   Potassium 04/03/2024 4.0  3.5 - 5.1 mmol/L Final   Chloride 04/03/2024 102  98 - 111 mmol/L Final   CO2 04/03/2024 26  22 - 32 mmol/L Final   Glucose, Bld 04/03/2024 122 (H)  70 - 99 mg/dL Final   Glucose reference range applies only to samples taken after fasting for at least 8 hours.   BUN 04/03/2024 15  6 - 20 mg/dL Final   Creatinine, Ser 04/03/2024 1.69 (H)  0.61 - 1.24 mg/dL Final   Calcium  04/03/2024 8.1 (L)  8.9 - 10.3 mg/dL Final   Total Protein 16/09/9603 7.7  6.5 - 8.1 g/dL Final   Albumin 54/08/8118 2.7 (L)  3.5 - 5.0 g/dL Final   AST 14/78/2956 13 (L)  15 - 41 U/L Final   ALT 04/03/2024 8  0 - 44 U/L Final   Alkaline Phosphatase 04/03/2024 50  38 - 126 U/L Final   Total Bilirubin 04/03/2024 0.3  0.0 - 1.2 mg/dL Final   GFR, Estimated 04/03/2024 47 (L)  >60 mL/min Final   Comment: (NOTE) Calculated using the CKD-EPI Creatinine Equation (2021)    Anion gap 04/03/2024 6  5 - 15 Final   Performed at [facility], [address] [place], [place] [address]   WBC 04/03/2024 11.8 (H)  4.0 - 10.5 K/uL Final   RBC 04/03/2024 3.73 (L)  4.22 - 5.81 MIL/uL Final   Hemoglobin 04/03/2024 9.6 (L)  13.0 - 17.0 g/dL Final   HCT 65/78/4696 30.8 (L)  39.0 - 52.0 % Final   MCV 04/03/2024 82.6  80.0 - 100.0 fL Final   MCH 04/03/2024 25.7 (L)  26.0 - 34.0 pg Final   MCHC 04/03/2024 31.2  30.0 - 36.0 g/dL Final   RDW 29/52/8413 14.6  11.5 - 15.5 % Final   Platelets 04/03/2024 517 (H)  150 - 400 K/uL Final   nRBC 04/03/2024 0.0  0.0 - 0.2 % Final   Neutrophils Relative % 04/03/2024 73  % Final   Neutro Abs 04/03/2024 8.6 (H)  1.7 - 7.7 K/uL Final   Lymphocytes Relative 04/03/2024 19  % Final   Lymphs  Abs 04/03/2024 2.2  0.7 - 4.0 K/uL Final   Monocytes Relative 04/03/2024 6  % Final   Monocytes Absolute 04/03/2024 0.7  0.1 - 1.0 K/uL Final   Eosinophils Relative 04/03/2024 1  % Final   Eosinophils Absolute 04/03/2024 0.1  0.0 - 0.5 K/uL Final   Basophils Relative 04/03/2024 0  % Final   Basophils Absolute 04/03/2024 0.0  0.0 - 0.1 K/uL Final   Immature Granulocytes 04/03/2024 1  % Final   Abs Immature Granulocytes 04/03/2024 0.08 (H)  0.00 - 0.07 K/uL Final   Performed at [facility], [address] [place], [place] [address]   CRP 04/03/2024 5.8 (H)  <1.0 mg/dL Final   Performed at [facility], [address] [place], [place] [address]   Sed Rate 04/03/2024 109 (H)  0 - 20 mm/hr Final   Performed at [facility], [address] [place] [place], [place] [address]   Specimen Description 04/03/2024    Final                   Value:WOUND Performed at [facility], [address] Johnella Naas [place], [place] [address]    Special Requests 04/03/2024    Final                   Value:NONE Performed at [name], [address] [place], [place] [address]    Gram Stain 04/03/2024    Final                   Value:RARE WBC PRESENT, PREDOMINANTLY PMN FEW GRAM NEGATIVE RODS Performed at [facility], [address], [place], [place] [address]    Culture 04/03/2024    Final                   Value:FEW PSEUDOMONAS AERUGINOSA RARE ACINETOBACTER  CALCOACETICUS/BAUMANNII COMPLEX    Report Status 04/03/2024 04/07/2024 FINAL   Final   Organism ID, Bacteria 04/03/2024 PSEUDOMONAS AERUGINOSA   Final   Organism ID, Bacteria 04/03/2024 ACINETOBACTER CALCOACETICUS/BAUMANNII COMPLEX   Final  Admission on 03/25/2024, Discharged on 03/28/2024  Component Date Value Ref Range Status   Glucose-Capillary 03/25/2024 350 (H)  70 - 99 mg/dL Final   Glucose reference range applies only to samples taken after fasting for at least 8 hours.   WBC 03/25/2024 18.0 (H)  4.0 -  10.5 K/uL Final   RBC 03/25/2024 4.08 (L)  4.22 - 5.81 MIL/uL Final   Hemoglobin 03/25/2024 10.8 (L)  13.0 - 17.0 g/dL Final   HCT 40/98/1191 33.7 (L)  39.0 - 52.0 % Final   MCV 03/25/2024 82.6  80.0 - 100.0 fL Final   MCH 03/25/2024 26.5  26.0 - 34.0 pg Final   MCHC 03/25/2024 32.0  30.0 - 36.0 g/dL Final   RDW 47/82/9562 13.7  11.5 - 15.5 % Final   Platelets 03/25/2024 340  150 - 400 K/uL Final   nRBC 03/25/2024 0.0  0.0 - 0.2 % Final   Performed at [facility], [address] [place], [place] [address]   Color, Urine 03/25/2024 AMBER (A)  YELLOW Final   BIOCHEMICALS MAY BE AFFECTED BY COLOR   APPearance 03/25/2024 CLOUDY (A)  CLEAR Final   Specific Gravity, Urine 03/25/2024 1.015  1.005 - 1.030 Final   pH 03/25/2024 5.0  5.0 - 8.0 Final   Glucose, UA 03/25/2024 >=500 (A)  NEGATIVE mg/dL Final   Hgb urine dipstick 03/25/2024 SMALL (A)  NEGATIVE Final   Bilirubin Urine 03/25/2024 NEGATIVE  NEGATIVE Final   Ketones, ur 03/25/2024 5 (A)  NEGATIVE mg/dL Final   Protein, ur 57/84/6962 100 (A)  NEGATIVE mg/dL Final   Nitrite 95/28/4132 NEGATIVE  NEGATIVE Final   Leukocytes,Ua 03/25/2024 NEGATIVE  NEGATIVE Final   RBC / HPF 03/25/2024 0-5  0 - 5 RBC/hpf Final   WBC, UA 03/25/2024 11-20  0 - 5 WBC/hpf Final   Bacteria, UA 03/25/2024 RARE (A)  NONE SEEN Final   Squamous Epithelial / HPF 03/25/2024 0-5  0 - 5 /HPF Final   WBC Clumps 03/25/2024 PRESENT   Final   Mucus 03/25/2024 PRESENT   Final   Hyaline Casts, UA 03/25/2024 PRESENT   Final   Granular Casts, UA 03/25/2024 PRESENT   Final   Performed at [facility], [address], [place], [place] [address]   Glucose-Capillary 03/25/2024 409 (H)  70 - 99 mg/dL Final   Glucose reference range applies only to samples taken after fasting for at least 8 hours.   pH, Ven 03/25/2024 7.417  7.25 - 7.43 Final   pCO2, Ven 03/25/2024 38.2 (L)  44 - 60 mmHg Final   pO2, Ven 03/25/2024 30 (LL)  32 - 45 mmHg Final   Bicarbonate  03/25/2024 24.6  20.0 - 28.0 mmol/L Final   TCO2 03/25/2024 26  22 - 32 mmol/L Final   O2 Saturation 03/25/2024 58  % Final   Acid-Base Excess 03/25/2024 0.0  0.0 - 2.0 mmol/L Final   Sodium 03/25/2024 129 (L)  135 - 145 mmol/L Final   Potassium 03/25/2024 3.9  3.5 - 5.1 mmol/L Final   Calcium , Ion 03/25/2024 1.05 (L)  1.15 - 1.40 mmol/L Final   HCT 03/25/2024 35.0 (L)  39.0 - 52.0 % Final   Hemoglobin 03/25/2024 11.9 (L)  13.0 - 17.0 g/dL Final  Sample type 03/25/2024 VENOUS   Final   Comment 03/25/2024 NOTIFIED PHYSICIAN   Final   Sodium 03/25/2024 128 (L)  135 - 145 mmol/L Final   Potassium 03/25/2024 4.0  3.5 - 5.1 mmol/L Final   Chloride 03/25/2024 93 (L)  98 - 111 mmol/L Final   CO2 03/25/2024 21 (L)  22 - 32 mmol/L Final   Glucose, Bld 03/25/2024 366 (H)  70 - 99 mg/dL Final   Glucose reference range applies only to samples taken after fasting for at least 8 hours.   BUN 03/25/2024 14  6 - 20 mg/dL Final   Creatinine, Ser 03/25/2024 1.84 (H)  0.61 - 1.24 mg/dL Final   Calcium  03/25/2024 8.2 (L)  8.9 - 10.3 mg/dL Final   GFR, Estimated 03/25/2024 42 (L)  >60 mL/min Final   Comment: (NOTE) Calculated using the CKD-EPI Creatinine Equation (2021)    Anion gap 03/25/2024 14  5 - 15 Final   Performed at [facility], [address], [place], [place] [address]   Sodium 03/25/2024 128 (L)  135 - 145 mmol/L Final   Potassium 03/25/2024 3.9  3.5 - 5.1 mmol/L Final   Chloride 03/25/2024 94 (L)  98 - 111 mmol/L Final   BUN 03/25/2024 16  6 - 20 mg/dL Final   Creatinine, Ser 03/25/2024 1.90 (H)  0.61 - 1.24 mg/dL Final   Glucose, Bld 09/81/1914 370 (H)  70 - 99 mg/dL Final   Glucose reference range applies only to samples taken after fasting for at least 8 hours.   Calcium , Ion 03/25/2024 1.03 (L)  1.15 - 1.40 mmol/L Final   TCO2 03/25/2024 21 (L)  22 - 32 mmol/L Final   Hemoglobin 03/25/2024 12.2 (L)  13.0 - 17.0 g/dL Final   HCT 78/29/5621 36.0 (L)  39.0 - 52.0 % Final    Lactic Acid, Venous 03/25/2024 1.4  0.5 - 1.9 mmol/L Final   Specimen Description 03/25/2024 BLOOD SITE NOT SPECIFIED   Final   Special Requests 03/25/2024 BOTTLES DRAWN AEROBIC AND ANAEROBIC Blood Culture results may not be optimal due to an inadequate volume of blood received in culture bottles   Final   Culture 03/25/2024    Final                   Value:NO GROWTH 5 DAYS Performed at [facility], [address] [place], [place] [address]    Report Status 03/25/2024 03/31/2024 FINAL   Final   Specimen Description 03/25/2024 BLOOD SITE NOT SPECIFIED   Final   Special Requests 03/25/2024 BOTTLES DRAWN AEROBIC AND ANAEROBIC Blood Culture results may not be optimal due to an inadequate volume of blood received in culture bottles   Final   Culture 03/25/2024    Final                   Value:NO GROWTH 5 DAYS Performed at [facility], [address] [place], [place] [address]    Report Status 03/25/2024 03/30/2024 FINAL   Final   CRP 03/25/2024 23.8 (H)  <1.0 mg/dL Final   Performed at [facility], [address], [place], [place] [address]   aPTT 03/26/2024 31  24 - 36 seconds Final   Performed at [facility], [address], [place], [place] [address]   Prothrombin Time 03/26/2024 15.4 (H)  11.4 - 15.2 seconds Final   INR 03/26/2024 1.2  0.8 - 1.2 Final  Comment: (NOTE) INR goal varies based on device and disease states. Performed at [facility], [address], [place], [place] [address]    Sodium 03/26/2024 131 (L)  135 - 145 mmol/L Final   Potassium 03/26/2024 3.3 (L)  3.5 - 5.1 mmol/L Final   Chloride 03/26/2024 97 (L)  98 - 111 mmol/L Final   CO2 03/26/2024 23  22 - 32 mmol/L Final   Glucose, Bld 03/26/2024 166 (H)  70 - 99 mg/dL Final   Glucose reference range applies only to samples taken after fasting for at least 8 hours.   BUN 03/26/2024 23 (H)  6 - 20 mg/dL Final   Creatinine, Ser 03/26/2024 2.14 (H)  0.61 - 1.24 mg/dL Final   Calcium   03/26/2024 7.8 (L)  8.9 - 10.3 mg/dL Final   GFR, Estimated 03/26/2024 35 (L)  >60 mL/min Final   Comment: (NOTE) Calculated using the CKD-EPI Creatinine Equation (2021)    Anion gap 03/26/2024 11  5 - 15 Final   Performed at [facility], [address] [place], [place] [address]   WBC 03/26/2024 15.6 (H)  4.0 - 10.5 K/uL Final   RBC 03/26/2024 3.48 (L)  4.22 - 5.81 MIL/uL Final   Hemoglobin 03/26/2024 9.2 (L)  13.0 - 17.0 g/dL Final   HCT 09/81/1914 27.9 (L)  39.0 - 52.0 % Final   MCV 03/26/2024 80.2  80.0 - 100.0 fL Final   MCH 03/26/2024 26.4  26.0 - 34.0 pg Final   MCHC 03/26/2024 33.0  30.0 - 36.0 g/dL Final   RDW 78/29/5621 13.5  11.5 - 15.5 % Final   Platelets 03/26/2024 285  150 - 400 K/uL Final   nRBC 03/26/2024 0.0  0.0 - 0.2 % Final   Performed at [facility], [address], [place], [place] [address]   HIV Screen 4th Generation wRfx 03/26/2024 Non Reactive  Non Reactive Final   Performed at [facility], [address] [place], [place] [address]   Hgb A1c MFr Bld 03/26/2024 13.1 (H)  4.8 - 5.6 % Final   Comment: (NOTE) Pre diabetes:          5.7%-6.4%  Diabetes:              >6.4%  Glycemic control for   <7.0% adults with diabetes    Mean Plasma Glucose 03/26/2024 329.27  mg/dL Final   Performed at [facility], [address] [place], [place] [address]   Glucose-Capillary 03/25/2024 321 (H)  70 - 99 mg/dL Final   Glucose reference range applies only to samples taken after fasting for at least 8 hours.   Glucose-Capillary 03/26/2024 149 (H)  70 - 99 mg/dL Final   Glucose reference range applies only to samples taken after fasting for at least 8 hours.   Glucose-Capillary 03/26/2024 148 (H)  70 - 99 mg/dL Final   Glucose reference range applies only to samples taken after fasting for at least 8 hours.   Glucose-Capillary 03/26/2024 178 (H)  70 - 99 mg/dL Final   Glucose reference range applies only to samples taken after fasting for at  least 8 hours.   Glucose-Capillary 03/26/2024 166 (H)  70 - 99 mg/dL Final   Glucose reference range applies only to samples taken after fasting for at least 8 hours.   Comment 1 03/26/2024 Notify RN   Final   Comment 2 03/26/2024 Document in Chart   Final   Glucose-Capillary  03/26/2024 166 (H)  70 - 99 mg/dL Final   Glucose reference range applies only to samples taken after fasting for at least 8 hours.   Glucose-Capillary 03/26/2024 173 (H)  70 - 99 mg/dL Final   Glucose reference range applies only to samples taken after fasting for at least 8 hours.   Comment 1 03/26/2024 Notify RN   Final   Sodium 03/27/2024 130 (L)  135 - 145 mmol/L Final   Potassium 03/27/2024 3.7  3.5 - 5.1 mmol/L Final   Chloride 03/27/2024 97 (L)  98 - 111 mmol/L Final   CO2 03/27/2024 22  22 - 32 mmol/L Final   Glucose, Bld 03/27/2024 186 (H)  70 - 99 mg/dL Final   Glucose reference range applies only to samples taken after fasting for at least 8 hours.   BUN 03/27/2024 22 (H)  6 - 20 mg/dL Final   Creatinine, Ser 03/27/2024 2.41 (H)  0.61 - 1.24 mg/dL Final   Calcium  03/27/2024 7.8 (L)  8.9 - 10.3 mg/dL Final   GFR, Estimated 03/27/2024 31 (L)  >60 mL/min Final   Comment: (NOTE) Calculated using the CKD-EPI Creatinine Equation (2021)    Anion gap 03/27/2024 11  5 - 15 Final   Performed at [facility], [address] [place], [place] [address]   Magnesium 03/27/2024 2.1  1.7 - 2.4 mg/dL Final   Performed at [facility], [address], [place], [place] [address]   WBC 03/27/2024 12.3 (H)  4.0 - 10.5 K/uL Final   RBC 03/27/2024 3.44 (L)  4.22 - 5.81 MIL/uL Final   Hemoglobin 03/27/2024 8.9 (L)  13.0 - 17.0 g/dL Final   HCT 19/14/7829 27.6 (L)  39.0 - 52.0 % Final   MCV 03/27/2024 80.2  80.0 - 100.0 fL Final   MCH 03/27/2024 25.9 (L)  26.0 - 34.0 pg Final   MCHC 03/27/2024 32.2  30.0 - 36.0 g/dL Final   RDW 56/21/3086 13.9  11.5 - 15.5 % Final   Platelets 03/27/2024 334  150 - 400 K/uL  Final   nRBC 03/27/2024 0.0  0.0 - 0.2 % Final   Neutrophils Relative % 03/27/2024 78  % Final   Neutro Abs 03/27/2024 9.5 (H)  1.7 - 7.7 K/uL Final   Lymphocytes Relative 03/27/2024 11  % Final   Lymphs Abs 03/27/2024 1.4  0.7 - 4.0 K/uL Final   Monocytes Relative 03/27/2024 10  % Final   Monocytes Absolute 03/27/2024 1.2 (H)  0.1 - 1.0 K/uL Final   Eosinophils Relative 03/27/2024 0  % Final   Eosinophils Absolute 03/27/2024 0.0  0.0 - 0.5 K/uL Final   Basophils Relative 03/27/2024 0  % Final   Basophils Absolute 03/27/2024 0.0  0.0 - 0.1 K/uL Final   Immature Granulocytes 03/27/2024 1  % Final   Abs Immature Granulocytes 03/27/2024 0.13 (H)  0.00 - 0.07 K/uL Final   Performed at [facility], [address], [place], [place] [address]   Glucose-Capillary 03/26/2024 232 (H)  70 - 99 mg/dL Final   Glucose reference range applies only to samples taken after fasting for at least 8 hours.   Comment 1 03/26/2024 Notify RN   Final   Glucose-Capillary 03/27/2024 232 (H)  70 - 99 mg/dL Final   Glucose reference range applies only to samples taken after fasting for at least 8 hours.   Glucose-Capillary 03/27/2024 174 (H)  70 - 99 mg/dL Final   Glucose reference range  applies only to samples taken after fasting for at least 8 hours.   Comment 1 03/27/2024 Notify RN   Final   Glucose-Capillary 03/27/2024 181 (H)  70 - 99 mg/dL Final   Glucose reference range applies only to samples taken after fasting for at least 8 hours.   Glucose-Capillary 03/27/2024 289 (H)  70 - 99 mg/dL Final   Glucose reference range applies only to samples taken after fasting for at least 8 hours.   Glucose-Capillary 03/27/2024 165 (H)  70 - 99 mg/dL Final   Glucose reference range applies only to samples taken after fasting for at least 8 hours.   Sodium 03/28/2024 131 (L)  135 - 145 mmol/L Final   Potassium 03/28/2024 3.6  3.5 - 5.1 mmol/L Final   Chloride 03/28/2024 98  98 - 111 mmol/L Final   CO2 03/28/2024 23   22 - 32 mmol/L Final   Glucose, Bld 03/28/2024 204 (H)  70 - 99 mg/dL Final   Glucose reference range applies only to samples taken after fasting for at least 8 hours.   BUN 03/28/2024 28 (H)  6 - 20 mg/dL Final   Creatinine, Ser 03/28/2024 2.16 (H)  0.61 - 1.24 mg/dL Final   Calcium  03/28/2024 8.0 (L)  8.9 - 10.3 mg/dL Final   GFR, Estimated 03/28/2024 35 (L)  >60 mL/min Final   Comment: (NOTE) Calculated using the CKD-EPI Creatinine Equation (2021)    Anion gap 03/28/2024 10  5 - 15 Final   Performed at [facility], [address] [place], [place] [address]   WBC 03/28/2024 13.2 (H)  4.0 - 10.5 K/uL Final   RBC 03/28/2024 3.37 (L)  4.22 - 5.81 MIL/uL Final   Hemoglobin 03/28/2024 8.8 (L)  13.0 - 17.0 g/dL Final   HCT 87/56/4332 27.3 (L)  39.0 - 52.0 % Final   MCV 03/28/2024 81.0  80.0 - 100.0 fL Final   MCH 03/28/2024 26.1  26.0 - 34.0 pg Final   MCHC 03/28/2024 32.2  30.0 - 36.0 g/dL Final   RDW 95/18/8416 14.0  11.5 - 15.5 % Final   Platelets 03/28/2024 353  150 - 400 K/uL Final   nRBC 03/28/2024 0.0  0.0 - 0.2 % Final   Neutrophils Relative % 03/28/2024 90  % Final   Neutro Abs 03/28/2024 11.9 (H)  1.7 - 7.7 K/uL Final   Lymphocytes Relative 03/28/2024 4  % Final   Lymphs Abs 03/28/2024 0.5 (L)  0.7 - 4.0 K/uL Final   Monocytes Relative 03/28/2024 5  % Final   Monocytes Absolute 03/28/2024 0.7  0.1 - 1.0 K/uL Final   Eosinophils Relative 03/28/2024 1  % Final   Eosinophils Absolute 03/28/2024 0.1  0.0 - 0.5 K/uL Final   Basophils Relative 03/28/2024 0  % Final   Basophils Absolute 03/28/2024 0.0  0.0 - 0.1 K/uL Final   WBC Morphology 03/28/2024 See Note   Final   Morphology unremarkable   RBC Morphology 03/28/2024 See Note   Final   Morphology unremarkable   Smear Review 03/28/2024 See Note   Final   Normal Platelet Morphology   nRBC 03/28/2024 0  0 /100 WBC Final   Abs Immature Granulocytes 03/28/2024 0.00  0.00 - 0.07 K/uL Final   Performed at [facility], [address], [place], [place] [address]   Glucose-Capillary 03/27/2024 242 (H)  70 - 99 mg/dL Final   Glucose reference range applies only to samples taken after fasting for  at least 8 hours.   Comment 1 03/27/2024 Notify RN   Final   Glucose-Capillary 03/27/2024 232 (H)  70 - 99 mg/dL Final   Glucose reference range applies only to samples taken after fasting for at least 8 hours.   Comment 1 03/27/2024 Notify RN   Final   Glucose-Capillary 03/28/2024 206 (H)  70 - 99 mg/dL Final   Glucose reference range applies only to samples taken after fasting for at least 8 hours.   Comment 1 03/28/2024 Notify RN   Final   Glucose-Capillary 03/28/2024 195 (H)  70 - 99 mg/dL Final   Glucose reference range applies only to samples taken after fasting for at least 8 hours.   Glucose-Capillary 03/28/2024 268 (H)  70 - 99 mg/dL Final   Glucose reference range applies only to samples taken after fasting for at least 8 hours.   Glucose-Capillary 03/28/2024 217 (H)  70 - 99 mg/dL Final   Glucose reference range applies only to samples taken after fasting for at least 8 hours.    Allergies: Patient has no known allergies.  Medications:  Facility Ordered Medications  Medication   acetaminophen  (TYLENOL ) tablet 650 mg   alum & mag hydroxide-simeth (MAALOX/MYLANTA) 200-200-20 MG/5ML suspension 30 mL   magnesium hydroxide (MILK OF MAGNESIA) suspension 30 mL   haloperidol (HALDOL) tablet 5 mg   And   diphenhydrAMINE (BENADRYL) capsule 50 mg   haloperidol lactate (HALDOL) injection 5 mg   And   diphenhydrAMINE (BENADRYL) injection 50 mg   And   LORazepam (ATIVAN) injection 2 mg   haloperidol lactate (HALDOL) injection 10 mg   And   diphenhydrAMINE (BENADRYL) injection 50 mg   And   LORazepam (ATIVAN) injection 2 mg   hydrOXYzine (ATARAX) tablet 25 mg   traZODone (DESYREL) tablet 50 mg   polyethylene glycol (MIRALAX  / GLYCOLAX ) packet 17 g   insulin  aspart (novoLOG ) injection 0-15 Units    insulin  aspart (novoLOG ) injection 0-5 Units   Vitamin D  (Ergocalciferol ) (DRISDOL ) 1.25 MG (50000 UNIT) capsule 50,000 Units   iron  polysaccharides (NIFEREX) capsule 150 mg   ascorbic acid  (VITAMIN C ) tablet 500 mg   cyanocobalamin  (VITAMIN B12) tablet 1,000 mcg   methocarbamol  (ROBAXIN ) tablet 750 mg   metFORMIN  (GLUCOPHAGE -XR) 24 hr tablet 1,000 mg   insulin  NPH Human (NOVOLIN N) injection 22 Units   And   insulin  NPH Human (NOVOLIN N) injection 22 Units   PTA Medications  Medication Sig   Continuous Blood Gluc Transmit (DEXCOM G6 TRANSMITTER) MISC Use to check blood sugar three times daily. Change transmitter once every 909 days. E11.69 (Patient not taking: Reported on 05/01/2024)   gabapentin  (NEURONTIN ) 300 MG capsule Take 1 capsule (300 mg total) by mouth at bedtime. (Patient not taking: Reported on 05/01/2024)   Insulin  Pen Needle 32G X 4 MM MISC Use as directed to inject insulin  up to 4 times daily.   [Paused] atorvastatin  (LIPITOR) 20 MG tablet Take 1 tablet (20 mg total) by mouth daily. (Patient not taking: Reported on 05/01/2024)   metFORMIN  (GLUCOPHAGE -XR) 500 MG 24 hr tablet Take 2 tablets (1,000 mg total) by mouth 2 (two) times daily with a meal. (Patient not taking: Reported on 05/01/2024)   methocarbamol  (ROBAXIN ) 750 MG tablet Take 1 tablet (750 mg total) by mouth every 6 (six) hours as needed for muscle spasms. (Patient not taking: Reported on 05/01/2024)   nitroGLYCERIN  (NITRODUR - DOSED IN MG/24 HR) 0.2 mg/hr patch Place 1 patch (0.2 mg total) onto  the skin daily.   glucose blood (ACCU-CHEK GUIDE TEST) test strip Use to check blood sugar 3 times daily.   Blood Glucose Monitoring Suppl (ACCU-CHEK GUIDE) w/Device KIT Use to check blood sugar 3 times daily.   Accu-Chek Softclix Lancets lancets Use to check blood sugar 3 times daily.   acetaminophen  (TYLENOL ) 325 MG tablet Take 2 tablets (650 mg total) by mouth every 6 (six) hours as needed for mild pain (pain score 1-3), fever  or headache.   losartan  (COZAAR ) 25 MG tablet Take 1 tablet (25 mg total) by mouth daily.   Insulin  NPH, Human,, Isophane, (HUMULIN  N KWIKPEN) 100 UNIT/ML Kiwkpen Inject 22 Units into the skin 2 (two) times daily.   bisacodyl  (DULCOLAX) 5 MG EC tablet Take 2 tablets (10 mg total) by mouth at bedtime as needed for moderate constipation.   polyethylene glycol powder (GLYCOLAX /MIRALAX ) 17 GM/SCOOP powder Take 17 g by mouth 2 (two) times daily. Mix as directed. (Patient not taking: Reported on 05/01/2024)   cyanocobalamin  1000 MCG tablet Take 1 tablet (1,000 mcg total) by mouth daily.   iron  polysaccharides (NIFEREX) 150 MG capsule Take 1 capsule (150 mg total) by mouth daily.   ascorbic acid  (VITAMIN C ) 500 MG tablet Take 1 tablet (500 mg total) by mouth daily.   Vitamin D , Ergocalciferol , (DRISDOL ) 1.25 MG (50000 UNIT) CAPS capsule Take 1 capsule (50,000 Units total) by mouth every 7 (seven) days.   insulin  aspart (NOVOLOG ) 100 UNIT/ML FlexPen Inject 0-15 Units into the skin 3 (three) times daily before meals.  Correction coverage: Moderate (average weight, post-op) CBG 70 - 120: 0 units CBG 121 - 150: 2 units CBG 151 - 200: 3 units CBG 201 - 250: 5 units CBG 251 - 300: 8 units CBG 301 - 350: 11 units CBG 351 - 400: 15 units CBG > 400: call MD      Medical Decision Making  Patient is recommended for inpatient psychiatric admission for stabilization.  Reviewed and resumed home medications as appropriate  Lab Orders         CBC with Differential/Platelet         Comprehensive metabolic panel         Ethanol         Lipid panel         TSH         POCT Urine Drug Screen - (I-Screen)       . -    Recommendations  Based on my evaluation the patient does not appear to have an emergency medical condition.  Doreatha Gamer, NP 05/12/24  6:15 AM

## 2024-05-11 NOTE — ED Notes (Addendum)
 Pt A&O x 4. Pt denies SI/HI/AVH. Pt oriented to the unit. Pt accepted meal and beverage was given. Pt denies physical pain at the time of admission however he said he has muscle spams at times. 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 Both feet are wrapped in ace bandages and covered and with a foot brace. Pt appears anxious and preoccupied. Support and encouragement provided. Facility protocol safety checks in place. Pt encouraged to notify staff if thoughts of hurting themselves or others arise. Pt verbalized understanding and agreement. Pt is currently safe on the unit.

## 2024-05-11 NOTE — Progress Notes (Signed)
   05/11/24 1844  [facility] Triage Screening (Walk-ins at [facility] only)  How Did You Hear About Us ? Family/Friend  What Is the Reason for Your Visit/Call Today? Pt presents to [facility] accompanied by his brother seeking substance use treatment. 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 Pt states he used cocaine saturday night ($700 worth). Pt reports he is using whenever he has the money. Pt is looking for long term treatment at this time. Pt denies using alchol use, Si, Hi and Avh.  How Long Has This Been Causing You Problems? > than 6 months  Have You Recently Had Any Thoughts About Hurting Yourself? No  Are You Planning to Commit Suicide/Harm Yourself At This time? No  Have you Recently Had Thoughts About Hurting Someone Marigene Shoulder? No  Are You Planning To Harm Someone At This Time? No  Physical Abuse Denies  Verbal Abuse Denies  Sexual Abuse Denies  Exploitation of patient/patient's resources Denies  Self-Neglect Denies  Possible abuse reported to: Other (Comment)  Are you currently experiencing any auditory, visual or other hallucinations? No  Have You Used Any Alcohol or Drugs in the Past 24 Hours? Yes  What Did You Use and How Much? Cocaine, $700 worth  Do you have any current medical co-morbidities that require immediate attention? No  Clinician description of patient physical appearance/behavior: calm, cooperative  What Do You Feel Would Help You the Most Today? Alcohol or Drug Use Treatment  If access to [facility] was not available, would you have sought care in the Emergency Department? No  Determination of Need Urgent (48 hours)  Options For Referral Facility-Based Crisis  Determination of Need filed? Yes

## 2024-05-11 NOTE — BH Assessment (Signed)
 Comprehensive Clinical Assessment (CCA) Note   05/11/2024 [name] [id number]  Disposition: [facility], NP recommends facility based crisis center.   The patient demonstrates the following risk factors for suicide: Chronic risk factors for suicide include: substance use disorder. Acute risk factors for suicide include: unemployment and social withdrawal/isolation. Protective factors for this patient include: positive social support. Considering these factors, the overall suicide risk at this point appears to be low. Patient is not appropriate for outpatient follow up.     Per triage note: "Pt presents to [facility] accompanied by his brother seeking substance use treatment. 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 Pt states he used cocaine saturday night ($700 worth). Pt reports he is using whenever he has the money. Pt is looking for long term treatment at this time. Pt denies using alchol use, Si, Hi and Avh."  Upon evaluation with this clinician, the patient is alert, oriented x 3, and cooperative. Speech is clear, coherent and logical. Pt appears casual. Eye contact is fair. Mood is  depressed; affect is congruent with mood. The thought process is logical and thought content is coherent. Pt reports that he had a problem with using drugs and is in need of treatment. Pt reports that he is depressed and often isolates himself due to health concerns as well. "There are times where I don't go outside for 4 days because of my foot pain. " Pt denies SI/HI/AVH. There is no indication that the patient is responding to internal stimuli. No delusions elicited during this assessment.     Chief Complaint:  Chief Complaint  Patient presents with   Addiction Problem   Visit Diagnosis:  Cocaine Use  Major Depressive Disorder     CCA Screening, Triage and Referral (STR)  Patient Reported Information How did you hear about us ? Family/Friend  What Is the Reason for Your  Visit/Call Today? Pt presents to [facility] accompanied by his brother seeking substance use treatment. 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 Pt states he used cocaine saturday night ($700 worth). Pt reports he is using whenever he has the money. Pt is looking for long term treatment at this time. Pt denies using alchol use, Si, Hi and Avh.  How Long Has This Been Causing You Problems? > than 6 months  What Do You Feel Would Help You the Most Today? Alcohol or Drug Use Treatment   Have You Recently Had Any Thoughts About Hurting Yourself? No  Are You Planning to Commit Suicide/Harm Yourself At This time? No   Flowsheet Row ED from 05/11/2024 in [facility] ED to Hosp-Admission (Discharged) from 04/17/2024 in [facility] ED to Hosp-Admission (Discharged) from 03/25/2024 in [facility]  C-SSRS RISK CATEGORY No Risk No Risk No Risk       Have you Recently Had Thoughts About Hurting Someone Marigene Shoulder? No  Are You Planning to Harm Someone at This Time? No  Explanation: Denies HI   Have You Used Any Alcohol or Drugs in the Past 24 Hours? Yes  How Long Ago Did You Use Drugs or Alcohol?yesterday  What Did You Use and How Much? Cocaine, $700 worth   Do You Currently Have a Therapist/Psychiatrist? No  Name of Therapist/Psychiatrist:    Have You Been Recently Discharged From Any Office Practice or Programs? No  Explanation of Discharge From  Practice/Program:  N/A   CCA Screening Triage Referral Assessment Type of Contact: Face-to-Face  Telemedicine Service Delivery:   Is this Initial or Reassessment?   Date Telepsych consult ordered in [facility]:    Time Telepsych consult ordered in [facility]:    Location of Assessment: [facility]  Provider Location: [facility] Assessment [facility]   Collateral Involvement: None   Does Patient Have a Dealer Guardian? No  Legal Guardian Contact Information: n/a  Copy of Legal Guardianship Form: -- (n/a)  Legal Guardian Notified of Arrival: -- (n/a)  Legal Guardian Notified of Pending Discharge: -- (n/a)  If Minor and Not Living with Parent(s), Who has Custody? n/a  Is CPS involved or ever been involved? Never  Is APS involved or ever been involved? Never   Patient Determined To Be At Risk for Harm To Self or Others Based on Review of Patient Reported Information or Presenting Complaint? No  Method: No Plan  Availability of Means: No access or NA  Intent: Vague intent or NA  Notification Required: No need or identified person  Additional Information for Danger to Others Potential: -- (n/a)  Additional Comments for Danger to Others Potential: n/a  Are There Guns or Other Weapons in Your Home? No  Types of Guns/Weapons: Denies Copywriter, advertising Secured?                            No  Who Could Verify You Are Able To Have These Secured: n/a  Do You Have any Outstanding Charges, Pending Court Dates, Parole/Probation? Pt denies pending legal charges  Contacted To Inform of Risk of Harm To Self or Others: Other: Comment (n/a)    Does Patient Present under Involuntary Commitment? No    [place] of Residence: [name]   Patient Currently Receiving the Following Services: Not Receiving Services   Determination of Need: Urgent (48 hours)   Options For Referral: Facility-Based Crisis     CCA Biopsychosocial Patient Reported Schizophrenia/Schizoaffective Diagnosis in Past: No   Strengths: Willing to seek treatment; supportive family   Mental Health Symptoms Depression:  Hopelessness; Worthlessness   Duration of Depressive symptoms: Duration of Depressive Symptoms: Greater than two weeks   Mania:  None   Anxiety:   None   Psychosis:  None   Duration of Psychotic symptoms:    Trauma:  None   Obsessions:  None   Compulsions:   None   Inattention:  None   Hyperactivity/Impulsivity:  None   Oppositional/Defiant Behaviors:  None   Emotional Irregularity:  Chronic feelings of emptiness   Other Mood/Personality Symptoms:  none    Mental Status Exam Appearance and self-care  Stature:  Average   Weight:  Average weight   Clothing:  Casual   Grooming:  Normal   Cosmetic use:  None   Posture/gait:  Normal   Motor activity:  Not Remarkable   Sensorium  Attention:  Normal   Concentration:  Normal   Orientation:  X5   Recall/memory:  Normal   Affect and Mood  Affect:  Depressed; Flat   Mood:  Depressed   Relating  Eye contact:  Normal   Facial expression:  Depressed   Attitude toward examiner:  Cooperative   Thought and Language  Speech flow: Clear and Coherent   Thought content:  Appropriate to Mood and Circumstances   Preoccupation:  None   Hallucinations:  None   Organization:  Coherent   [employer] of Knowledge:  Good   Intelligence:  Average   Abstraction:  Normal   Judgement:  Impaired   Reality Testing:  Adequate   Insight:  Fair   Decision Making:  Impulsive   Social Functioning  Social Maturity:  Impulsive   Social Judgement:  Heedless   Stress  Stressors:  Illness (Pt reports pain in feet which hinders him at times)   Coping Ability:  Overwhelmed; Exhausted   Skill Deficits:  Interpersonal; Self-control   Supports:  Friends/Service system; Family     Religion: Religion/Spirituality Are You A Religious Person?: No How Might This Affect Treatment?: n/a  Leisure/Recreation: Leisure / Recreation Do You Have Hobbies?: No  Exercise/Diet: Exercise/Diet Do You Exercise?: No Have You Gained or Lost A Significant Amount of Weight in the Past Six Months?: No Do You Follow a Special Diet?: No Do You Have Any Trouble Sleeping?: No   CCA Employment/Education Employment/Work Situation: Employment / Work Situation Employment  Situation: Unemployed Patient's Job has Been Impacted by Current Illness: No Has Patient ever Been in Equities trader?: No  Education: Education Is Patient Currently Attending School?: No Last Grade Completed: 11 Did You Product manager?: No Did You Have An Individualized Education Program (IIEP): No Did You Have Any Difficulty At [employer]?: No Patient's Education Has Been Impacted by Current Illness: No   CCA Family/Childhood History Family and Relationship History: Family history Marital status: Single Does patient have children?: No  Childhood History:  Childhood History By whom was/is the patient raised?: Mother Did patient suffer any verbal/emotional/physical/sexual abuse as a child?: No Did patient suffer from severe childhood neglect?: No Has patient ever been sexually abused/assaulted/raped as an adolescent or adult?: No Was the patient ever a victim of a crime or a disaster?: No Witnessed domestic violence?: No Has patient been affected by domestic violence as an adult?: No       CCA Substance Use Alcohol/Drug Use: Alcohol / Drug Use Pain Medications: See MAR Prescriptions: See MAR Over the Counter: See MAR History of alcohol / drug use?: Yes Longest period of sobriety (when/how long): unknown Negative Consequences of Use: Personal relationships, Financial Withdrawal Symptoms: None Substance #1 Name of Substance 1: Cocaine 1 - Age of First Use: 38 1 - Amount (size/oz): unknown  ($700 worth last use) 1 - Frequency: "whenever I get money" - income is unstable 1 - Last Use / Amount: Saturday 05/10/24                       ASAM's:  Six Dimensions of Multidimensional Assessment  Dimension 1:  Acute Intoxication and/or Withdrawal Potential:      Dimension 2:  Biomedical Conditions and Complications:      Dimension 3:  Emotional, Behavioral, or Cognitive Conditions and Complications:     Dimension 4:  Readiness to Change:     Dimension 5:  Relapse,  Continued use, or Continued Problem Potential:     Dimension 6:  Recovery/Living Environment:     ASAM Severity Score:    ASAM Recommended Level of Treatment: ASAM Recommended Level of Treatment: Level II Intensive Outpatient Treatment   Substance use Disorder (SUD) Substance Use Disorder (SUD)  Checklist Symptoms of Substance Use: Continued use despite having a persistent/recurrent physical/psychological problem caused/exacerbated by use, Continued use despite persistent or recurrent social, interpersonal problems, caused or exacerbated by use  Recommendations for Services/Supports/Treatments: Recommendations for Services/Supports/Treatments Recommendations For Services/Supports/Treatments: Facility Based Crisis, CD-IOP Intensive  Chemical Dependency Program  Disposition Recommendation per psychiatric provider:  Facility Based Crisis Center   DSM5 Diagnoses: Patient Active Problem List   Diagnosis Date Noted   MRSA bacteremia 04/21/2024   HLD (hyperlipidemia) 04/17/2024   Chronic kidney disease, stage 3a (HCC) 04/17/2024   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   Normocytic anemia 04/17/2024   Amputation of right great toe (HCC) 03/31/2024   Necrotizing soft tissue infection 03/26/2024   Hypertension associated with diabetes (HCC) 12/12/2022   Toe osteomyelitis, left (HCC) 12/12/2022   Pyogenic inflammation of bone (HCC) 06/02/2022   PICC (peripherally inserted central catheter) in place 06/02/2022   Medication management 06/02/2022   Diabetic foot infection (HCC) 06/02/2022   Smoking 06/02/2022   Tobacco abuse 05/06/2022   Hyperkalemia 05/06/2022   Type 2 diabetes mellitus (HCC) 05/05/2022   Obesity (BMI 30-39.9) 05/05/2022   Cellulitis and abscess of foot    AKI (acute kidney injury) (HCC) 05/04/2022   Injury of left foot    Diabetic foot ulcer (HCC) 12/14/2021     Referrals to Alternative Service(s): Referred to Alternative Service(s):    Place:   Date:   Time:    Referred to Alternative Service(s):   Place:   Date:   Time:    Referred to Alternative Service(s):   Place:   Date:   Time:    Referred to Alternative Service(s):   Place:   Date:   Time:     [place], [place], [facility]

## 2024-05-11 NOTE — ED Notes (Addendum)
 Pt stated that he is looking for long term care and prefers it to be out of [place]. Pt also stated that he takes Gabapentin  300mg  at home which helps with his occasional muscle spasms which he last took 2 days ago. He is safe on the unit at this time.

## 2024-05-12 ENCOUNTER — Telehealth: Payer: Self-pay

## 2024-05-12 ENCOUNTER — Ambulatory Visit (INDEPENDENT_AMBULATORY_CARE_PROVIDER_SITE_OTHER): Admitting: Family

## 2024-05-12 ENCOUNTER — Encounter: Payer: Self-pay | Admitting: Family

## 2024-05-12 ENCOUNTER — Other Ambulatory Visit: Payer: Self-pay

## 2024-05-12 VITALS — BP 84/58 | HR 88 | Temp 97.9°F

## 2024-05-12 DIAGNOSIS — L089 Local infection of the skin and subcutaneous tissue, unspecified: Secondary | ICD-10-CM

## 2024-05-12 DIAGNOSIS — Z452 Encounter for adjustment and management of vascular access device: Secondary | ICD-10-CM | POA: Diagnosis not present

## 2024-05-12 DIAGNOSIS — B9562 Methicillin resistant Staphylococcus aureus infection as the cause of diseases classified elsewhere: Secondary | ICD-10-CM

## 2024-05-12 DIAGNOSIS — E119 Type 2 diabetes mellitus without complications: Secondary | ICD-10-CM | POA: Diagnosis not present

## 2024-05-12 DIAGNOSIS — E11628 Type 2 diabetes mellitus with other skin complications: Secondary | ICD-10-CM

## 2024-05-12 DIAGNOSIS — R7881 Bacteremia: Secondary | ICD-10-CM | POA: Diagnosis not present

## 2024-05-12 LAB — POCT URINE DRUG SCREEN - MANUAL ENTRY (I-SCREEN)
POC Amphetamine UR: NOT DETECTED
POC Buprenorphine (BUP): NOT DETECTED
POC Cocaine UR: POSITIVE — AB
POC Marijuana UR: NOT DETECTED
POC Methadone UR: NOT DETECTED
POC Methamphetamine UR: NOT DETECTED
POC Morphine: NOT DETECTED
POC Oxazepam (BZO): NOT DETECTED
POC Oxycodone UR: NOT DETECTED
POC Secobarbital (BAR): NOT DETECTED

## 2024-05-12 LAB — GLUCOSE, CAPILLARY
Glucose-Capillary: 194 mg/dL — ABNORMAL HIGH (ref 70–99)
Glucose-Capillary: 175 mg/dL — ABNORMAL HIGH (ref 70–99)

## 2024-05-12 MED ORDER — INSULIN ASPART 100 UNIT/ML IJ SOLN: 0.0000 [IU] | Freq: Every day | INTRAMUSCULAR | Status: DC

## 2024-05-12 MED ORDER — INSULIN GLARGINE-YFGN 100 UNIT/ML ~~LOC~~ SOLN
5.0000 [IU] | Freq: Every day | SUBCUTANEOUS | Status: DC
Start: 2024-05-12 — End: 2024-05-12

## 2024-05-12 MED ORDER — INSULIN NPH (HUMAN) (ISOPHANE) 100 UNIT/ML ~~LOC~~ SUSP: 22.0000 [IU] | Freq: Every day | SUBCUTANEOUS | Status: DC

## 2024-05-12 MED ORDER — INSULIN ASPART 100 UNIT/ML IJ SOLN
0.0000 [IU] | Freq: Three times a day (TID) | INTRAMUSCULAR | Status: DC
  Administered 2024-05-12 (×2): 3 [IU] via SUBCUTANEOUS

## 2024-05-12 MED ORDER — INSULIN ISOPHANE HUMAN 100 UNIT/ML KWIKPEN: 22.0000 [IU] | PEN_INJECTOR | Freq: Two times a day (BID) | SUBCUTANEOUS | Status: DC

## 2024-05-12 MED ORDER — POLYSACCHARIDE IRON COMPLEX 150 MG PO CAPS
150.0000 mg | ORAL_CAPSULE | Freq: Every day | ORAL | Status: DC
  Administered 2024-05-12: 150 mg via ORAL
  Filled 2024-05-12: qty 1

## 2024-05-12 MED ORDER — VITAMIN D (ERGOCALCIFEROL) 1.25 MG (50000 UNIT) PO CAPS
50000.0000 [IU] | ORAL_CAPSULE | ORAL | Status: DC
  Administered 2024-05-12: 50000 [IU] via ORAL
  Filled 2024-05-12: qty 1

## 2024-05-12 MED ORDER — LOSARTAN POTASSIUM 50 MG PO TABS
25.0000 mg | ORAL_TABLET | Freq: Every day | ORAL | Status: DC
  Administered 2024-05-12: 25 mg via ORAL
  Filled 2024-05-12: qty 1

## 2024-05-12 MED ORDER — INSULIN NPH (HUMAN) (ISOPHANE) 100 UNIT/ML ~~LOC~~ SUSP
22.0000 [IU] | Freq: Every day | SUBCUTANEOUS | Status: DC
  Administered 2024-05-12: 22 [IU] via SUBCUTANEOUS

## 2024-05-12 MED ORDER — VITAMIN C 500 MG PO TABS
500.0000 mg | ORAL_TABLET | Freq: Every day | ORAL | Status: DC
  Administered 2024-05-12: 500 mg via ORAL
  Filled 2024-05-12: qty 1

## 2024-05-12 MED ORDER — METFORMIN HCL ER 500 MG PO TB24
1000.0000 mg | ORAL_TABLET | Freq: Two times a day (BID) | ORAL | Status: DC
  Administered 2024-05-12: 1000 mg via ORAL
  Filled 2024-05-12: qty 2

## 2024-05-12 MED ORDER — VITAMIN B-12 1000 MCG PO TABS
1000.0000 ug | ORAL_TABLET | Freq: Every day | ORAL | Status: DC
  Administered 2024-05-12: 1000 ug via ORAL
  Filled 2024-05-12: qty 1

## 2024-05-12 MED ORDER — METHOCARBAMOL 750 MG PO TABS: 750.0000 mg | ORAL_TABLET | Freq: Four times a day (QID) | ORAL | Status: DC | PRN

## 2024-05-12 MED ORDER — INSULIN ASPART 100 UNIT/ML FLEXPEN: 0.0000 [IU] | PEN_INJECTOR | Freq: Three times a day (TID) | SUBCUTANEOUS | Status: DC

## 2024-05-12 NOTE — ED Provider Notes (Incomplete)
 Behavioral Health Progress Note  Date and Time: 05/12/2024 2:09 PM Name: [name] MRN:  [id number]     Diagnosis:  Final diagnoses:  Cocaine abuse (HCC)  Cocaine abuse with cocaine-induced mood disorder (HCC)    Total Time spent with patient: 30 minutes  Past Psychiatric History: No psychiatric history Past Medical History: Insulin -dependent type 2 diabetes, constipation, hypertension, back pain, angina Family History: No pertinent Family Psychiatric  History: No pertinent Social History: Currently living at home with his brother.  Reports that his still support is his brother.  Reports has been unemployed since his foot ulcers have worsened.  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  Additional Social History:    Pain Medications: See MAR Prescriptions: See MAR Over the Counter: See MAR History of alcohol / drug use?: Yes Longest period of sobriety (when/how long): unknown Negative Consequences of Use: Personal relationships, Financial Withdrawal Symptoms: None Name of Substance 1: Cocaine 1 - Age of First Use: 38 1 - Amount (size/oz): unknown  ($700 worth last use) 1 - Frequency: "whenever I get money" - income is unstable 1 - Last Use / Amount: Saturday 05/10/24                  Sleep: Fair  Appetite:  Fair  Current Medications:  Current Facility-Administered Medications  Medication Dose Route Frequency Provider Last Rate Last Admin   acetaminophen  (TYLENOL ) tablet 650 mg  650 mg Oral Q6H PRN Ajibola, Ene A, NP       alum & mag hydroxide-simeth (MAALOX/MYLANTA) 200-200-20 MG/5ML suspension 30 mL  30 mL Oral Q4H PRN Ajibola, Ene A, NP       ascorbic acid  (VITAMIN C ) tablet 500 mg  500 mg Oral Daily Ajibola, Ene A, NP   500 mg at 05/12/24 0946   cyanocobalamin  (VITAMIN B12) tablet 1,000 mcg  1,000 mcg Oral Daily Ajibola, Ene A, NP   1,000 mcg at 05/12/24 0946   haloperidol (HALDOL) tablet 5 mg  5 mg Oral TID PRN  Ajibola, Ene A, NP       And   diphenhydrAMINE (BENADRYL) capsule 50 mg  50 mg Oral TID PRN Ajibola, Ene A, NP       haloperidol lactate (HALDOL) injection 5 mg  5 mg Intramuscular TID PRN Ajibola, Ene A, NP       And   diphenhydrAMINE (BENADRYL) injection 50 mg  50 mg Intramuscular TID PRN Ajibola, Ene A, NP       And   LORazepam (ATIVAN) injection 2 mg  2 mg Intramuscular TID PRN Ajibola, Ene A, NP       haloperidol lactate (HALDOL) injection 10 mg  10 mg Intramuscular TID PRN Ajibola, Ene A, NP       And   diphenhydrAMINE (BENADRYL) injection 50 mg  50 mg Intramuscular TID PRN Ajibola, Ene A, NP       And   LORazepam (ATIVAN) injection 2 mg  2 mg Intramuscular TID PRN Ajibola, Ene A, NP       hydrOXYzine (ATARAX) tablet 25 mg  25 mg Oral TID PRN Ajibola, Ene A, NP   25 mg at 05/11/24 2207   insulin  aspart (novoLOG ) injection 0-15 Units  0-15 Units Subcutaneous TID WC Ajibola, Ene A, NP   3 Units at 05/12/24 1301   insulin  aspart (novoLOG ) injection 0-5 Units  0-5 Units Subcutaneous QHS Ajibola, Ene A, NP  insulin  NPH Human (NOVOLIN N) injection 22 Units  22 Units Subcutaneous QAC breakfast Ajibola, Ene A, NP   22 Units at 05/12/24 0945   And   insulin  NPH Human (NOVOLIN N) injection 22 Units  22 Units Subcutaneous QHS Ajibola, Ene A, NP       iron  polysaccharides (NIFEREX) capsule 150 mg  150 mg Oral Daily Ajibola, Ene A, NP   150 mg at 05/12/24 1214   losartan  (COZAAR ) tablet 25 mg  25 mg Oral Daily Mikle Sternberg, MD   25 mg at 05/12/24 0946   magnesium hydroxide (MILK OF MAGNESIA) suspension 30 mL  30 mL Oral Daily PRN Ajibola, Ene A, NP   30 mL at 05/11/24 2207   metFORMIN  (GLUCOPHAGE -XR) 24 hr tablet 1,000 mg  1,000 mg Oral BID WC Ajibola, Ene A, NP   1,000 mg at 05/12/24 1610   methocarbamol  (ROBAXIN ) tablet 750 mg  750 mg Oral Q6H PRN Ajibola, Ene A, NP       polyethylene glycol (MIRALAX  / GLYCOLAX ) packet 17 g  17 g Oral Daily PRN Ajibola, Ene A, NP       traZODone (DESYREL)  tablet 50 mg  50 mg Oral QHS PRN Ajibola, Ene A, NP   50 mg at 05/11/24 2207   Vitamin D  (Ergocalciferol ) (DRISDOL ) 1.25 MG (50000 UNIT) capsule 50,000 Units  50,000 Units Oral Q7 days Ajibola, Ene A, NP   50,000 Units at 05/12/24 0946   Current Outpatient Medications  Medication Sig Dispense Refill   Accu-Chek Softclix Lancets lancets Use to check blood sugar 3 times daily. 100 each 6   ascorbic acid  (VITAMIN C ) 500 MG tablet Take 1 tablet (500 mg total) by mouth daily. 30 tablet 2   bisacodyl  (DULCOLAX) 5 MG EC tablet Take 2 tablets (10 mg total) by mouth at bedtime as needed for moderate constipation. 30 tablet 0   Blood Glucose Monitoring Suppl (ACCU-CHEK GUIDE) w/Device KIT Use to check blood sugar 3 times daily. 1 kit 0   Continuous Blood Gluc Transmit (DEXCOM G6 TRANSMITTER) MISC Use to check blood sugar three times daily. Change transmitter once every 909 days. E11.69 1 each 1   cyanocobalamin  1000 MCG tablet Take 1 tablet (1,000 mcg total) by mouth daily. 90 tablet 0   gabapentin  (NEURONTIN ) 300 MG capsule Take 1 capsule (300 mg total) by mouth at bedtime. 30 capsule 3   glucose blood (ACCU-CHEK GUIDE TEST) test strip Use to check blood sugar 3 times daily. 100 each 6   insulin  aspart (NOVOLOG ) 100 UNIT/ML FlexPen Inject 0-15 Units into the skin 3 (three) times daily before meals.  Correction coverage: Moderate (average weight, post-op) CBG 70 - 120: 0 units CBG 121 - 150: 2 units CBG 151 - 200: 3 units CBG 201 - 250: 5 units CBG 251 - 300: 8 units CBG 301 - 350: 11 units CBG 351 - 400: 15 units CBG > 400: call MD 15 mL 11   Insulin  NPH, Human,, Isophane, (HUMULIN  N KWIKPEN) 100 UNIT/ML Kiwkpen Inject 22 Units into the skin 2 (two) times daily. 30 mL 5   Insulin  Pen Needle 32G X 4 MM MISC Use as directed to inject insulin  up to 4 times daily. 100 each 0   iron  polysaccharides (NIFEREX) 150 MG capsule Take 1 capsule (150 mg total) by mouth daily. 90 capsule 0   losartan  (COZAAR ) 25  MG tablet Take 1 tablet (25 mg total) by mouth daily. 30 tablet 11   metFORMIN  (  GLUCOPHAGE -XR) 500 MG 24 hr tablet Take 2 tablets (1,000 mg total) by mouth 2 (two) times daily with a meal. 120 tablet 6   methocarbamol  (ROBAXIN ) 750 MG tablet Take 1 tablet (750 mg total) by mouth every 6 (six) hours as needed for muscle spasms. 45 tablet 0   nitroGLYCERIN  (NITRODUR - DOSED IN MG/24 HR) 0.2 mg/hr patch Place 1 patch (0.2 mg total) onto the skin daily. 30 patch 12   polyethylene glycol powder (GLYCOLAX /MIRALAX ) 17 GM/SCOOP powder Take 17 g by mouth 2 (two) times daily. Mix as directed. (Patient taking differently: Take 17 g by mouth 2 (two) times daily as needed for mild constipation. Mix as directed.) 238 g 0   Vitamin D , Ergocalciferol , (DRISDOL ) 1.25 MG (50000 UNIT) CAPS capsule Take 1 capsule (50,000 Units total) by mouth every 7 (seven) days. (Patient taking differently: Take 50,000 Units by mouth every Tuesday.) 12 capsule 0    Labs  Lab Results:  Admission on 05/11/2024  Component Date Value Ref Range Status   POC Amphetamine UR 05/12/2024 None Detected  NONE DETECTED (Cut Off Level 1000 ng/mL) Final   POC Secobarbital (BAR) 05/12/2024 None Detected  NONE DETECTED (Cut Off Level 300 ng/mL) Final   POC Buprenorphine (BUP) 05/12/2024 None Detected  NONE DETECTED (Cut Off Level 10 ng/mL) Final   POC Oxazepam (BZO) 05/12/2024 None Detected  NONE DETECTED (Cut Off Level 300 ng/mL) Final   POC Cocaine UR 05/12/2024 Positive (A)  NONE DETECTED (Cut Off Level 300 ng/mL) Final   POC Methamphetamine UR 05/12/2024 None Detected  NONE DETECTED (Cut Off Level 1000 ng/mL) Final   POC Morphine  05/12/2024 None Detected  NONE DETECTED (Cut Off Level 300 ng/mL) Final   POC Methadone UR 05/12/2024 None Detected  NONE DETECTED (Cut Off Level 300 ng/mL) Final   POC Oxycodone  UR 05/12/2024 None Detected  NONE DETECTED (Cut Off Level 100 ng/mL) Final   POC Marijuana UR 05/12/2024 None Detected  NONE DETECTED (Cut  Off Level 50 ng/mL) Final   WBC 05/11/2024 9.7  4.0 - 10.5 K/uL Final   RBC 05/11/2024 3.60 (L)  4.22 - 5.81 MIL/uL Final   Hemoglobin 05/11/2024 9.2 (L)  13.0 - 17.0 g/dL Final   HCT 14/78/2956 29.6 (L)  39.0 - 52.0 % Final   MCV 05/11/2024 82.2  80.0 - 100.0 fL Final   MCH 05/11/2024 25.6 (L)  26.0 - 34.0 pg Final   MCHC 05/11/2024 31.1  30.0 - 36.0 g/dL Final   RDW 21/30/8657 15.0  11.5 - 15.5 % Final   Platelets 05/11/2024 345  150 - 400 K/uL Final   nRBC 05/11/2024 0.0  0.0 - 0.2 % Final   Neutrophils Relative % 05/11/2024 60  % Final   Neutro Abs 05/11/2024 5.8  1.7 - 7.7 K/uL Final   Lymphocytes Relative 05/11/2024 29  % Final   Lymphs Abs 05/11/2024 2.8  0.7 - 4.0 K/uL Final   Monocytes Relative 05/11/2024 9  % Final   Monocytes Absolute 05/11/2024 0.9  0.1 - 1.0 K/uL Final   Eosinophils Relative 05/11/2024 2  % Final   Eosinophils Absolute 05/11/2024 0.2  0.0 - 0.5 K/uL Final   Basophils Relative 05/11/2024 0  % Final   Basophils Absolute 05/11/2024 0.0  0.0 - 0.1 K/uL Final   Immature Granulocytes 05/11/2024 0  % Final   Abs Immature Granulocytes 05/11/2024 0.03  0.00 - 0.07 K/uL Final   Performed at [facility], [address] [place],  [place] [address]   Sodium 05/11/2024 132 (L)  135 - 145 mmol/L Final   Potassium 05/11/2024 3.8  3.5 - 5.1 mmol/L Final   Chloride 05/11/2024 96 (L)  98 - 111 mmol/L Final   CO2 05/11/2024 25  22 - 32 mmol/L Final   Glucose, Bld 05/11/2024 168 (H)  70 - 99 mg/dL Final   Glucose reference range applies only to samples taken after fasting for at least 8 hours.   BUN 05/11/2024 25 (H)  6 - 20 mg/dL Final   Creatinine, Ser 05/11/2024 1.69 (H)  0.61 - 1.24 mg/dL Final   Calcium  05/11/2024 8.9  8.9 - 10.3 mg/dL Final   Total Protein 91/47/8295 7.4  6.5 - 8.1 g/dL Final   Albumin 62/13/0865 3.5  3.5 - 5.0 g/dL Final   AST 78/46/9629 12 (L)  15 - 41 U/L Final   ALT 05/11/2024 12  0 - 44 U/L Final   Alkaline Phosphatase 05/11/2024 84  38  - 126 U/L Final   Total Bilirubin 05/11/2024 0.8  0.0 - 1.2 mg/dL Final   GFR, Estimated 05/11/2024 47 (L)  >60 mL/min Final   Comment: (NOTE) Calculated using the CKD-EPI Creatinine Equation (2021)    Anion gap 05/11/2024 11  5 - 15 Final   Performed at [facility], [address] [place], [place] [address]   Alcohol, Ethyl (B) 05/11/2024 <15  <15 mg/dL Final   Comment: (NOTE) For medical purposes only. Performed at [facility], [address], [place], [place] [address]    Cholesterol 05/11/2024 111  0 - 200 mg/dL Final   Triglycerides 09/30/2535 106  <150 mg/dL Final   HDL 64/40/3474 28 (L)  >40 mg/dL Final   Total CHOL/HDL Ratio 05/11/2024 4.0  RATIO Final   VLDL 05/11/2024 21  0 - 40 mg/dL Final   LDL Cholesterol 05/11/2024 62  0 - 99 mg/dL Final   Comment:        Total Cholesterol/HDL:CHD Risk Coronary Heart Disease Risk Table                     Men   Women  1/2 Average Risk   3.4   3.3  Average Risk       5.0   4.4  2 X Average Risk   9.6   7.1  3 X Average Risk  23.4   11.0        Use the calculated Patient Ratio above and the CHD Risk Table to determine the patient's CHD Risk.        ATP III CLASSIFICATION (LDL):  <100     mg/dL   Optimal  259-563  mg/dL   Near or Above                    Optimal  130-159  mg/dL   Borderline  875-643  mg/dL   High  >329     mg/dL   Very High Performed at [facility], [address], [place], [place] [address]    TSH 05/11/2024 1.045  0.350 - 4.500 uIU/mL Final   Comment: Performed by a 3rd Generation assay with a functional sensitivity of <=0.01 uIU/mL. Performed at [facility], [address] [place], [place] [address]    Glucose-Capillary 05/12/2024 194 (H)  70 - 99 mg/dL Final   Glucose reference range applies only to samples taken after fasting for at least 8 hours.  Glucose-Capillary 05/12/2024 175 (H)  70 - 99 mg/dL Final   Glucose reference range applies only to samples taken after  fasting for at least 8 hours.  No results displayed because visit has over 200 results.    Hospital Outpatient Visit on 04/03/2024  Component Date Value Ref Range Status   Sodium 04/03/2024 134 (L)  135 - 145 mmol/L Final   Potassium 04/03/2024 4.0  3.5 - 5.1 mmol/L Final   Chloride 04/03/2024 102  98 - 111 mmol/L Final   CO2 04/03/2024 26  22 - 32 mmol/L Final   Glucose, Bld 04/03/2024 122 (H)  70 - 99 mg/dL Final   Glucose reference range applies only to samples taken after fasting for at least 8 hours.   BUN 04/03/2024 15  6 - 20 mg/dL Final   Creatinine, Ser 04/03/2024 1.69 (H)  0.61 - 1.24 mg/dL Final   Calcium  04/03/2024 8.1 (L)  8.9 - 10.3 mg/dL Final   Total Protein 78/29/5621 7.7  6.5 - 8.1 g/dL Final   Albumin 30/86/5784 2.7 (L)  3.5 - 5.0 g/dL Final   AST 69/62/9528 13 (L)  15 - 41 U/L Final   ALT 04/03/2024 8  0 - 44 U/L Final   Alkaline Phosphatase 04/03/2024 50  38 - 126 U/L Final   Total Bilirubin 04/03/2024 0.3  0.0 - 1.2 mg/dL Final   GFR, Estimated 04/03/2024 47 (L)  >60 mL/min Final   Comment: (NOTE) Calculated using the CKD-EPI Creatinine Equation (2021)    Anion gap 04/03/2024 6  5 - 15 Final   Performed at [facility], [address] [place], [place] [address]   WBC 04/03/2024 11.8 (H)  4.0 - 10.5 K/uL Final   RBC 04/03/2024 3.73 (L)  4.22 - 5.81 MIL/uL Final   Hemoglobin 04/03/2024 9.6 (L)  13.0 - 17.0 g/dL Final   HCT 40/09/2724 30.8 (L)  39.0 - 52.0 % Final   MCV 04/03/2024 82.6  80.0 - 100.0 fL Final   MCH 04/03/2024 25.7 (L)  26.0 - 34.0 pg Final   MCHC 04/03/2024 31.2  30.0 - 36.0 g/dL Final   RDW 36/64/4034 14.6  11.5 - 15.5 % Final   Platelets 04/03/2024 517 (H)  150 - 400 K/uL Final   nRBC 04/03/2024 0.0  0.0 - 0.2 % Final   Neutrophils Relative % 04/03/2024 73  % Final   Neutro Abs 04/03/2024 8.6 (H)  1.7 - 7.7 K/uL Final   Lymphocytes Relative 04/03/2024 19  % Final   Lymphs Abs 04/03/2024 2.2  0.7 - 4.0 K/uL Final   Monocytes  Relative 04/03/2024 6  % Final   Monocytes Absolute 04/03/2024 0.7  0.1 - 1.0 K/uL Final   Eosinophils Relative 04/03/2024 1  % Final   Eosinophils Absolute 04/03/2024 0.1  0.0 - 0.5 K/uL Final   Basophils Relative 04/03/2024 0  % Final   Basophils Absolute 04/03/2024 0.0  0.0 - 0.1 K/uL Final   Immature Granulocytes 04/03/2024 1  % Final   Abs Immature Granulocytes 04/03/2024 0.08 (H)  0.00 - 0.07 K/uL Final   Performed at [facility], [address] [place], [place] [address]   CRP 04/03/2024 5.8 (H)  <1.0 mg/dL Final   Performed at [facility], [address], [place], [place] [address]   Sed Rate 04/03/2024 109 (H)  0 - 20 mm/hr Final   Performed at [facility], [address], [place], [place] [address]   Specimen Description  04/03/2024    Final                   Value:WOUND Performed at [facility], [address], [place], [place] [address]    Special Requests 04/03/2024    Final                   Value:NONE Performed at [facility], [address] [place], [place] [address]    Gram Stain 04/03/2024    Final                   Value:RARE WBC PRESENT, PREDOMINANTLY PMN FEW GRAM NEGATIVE RODS Performed at [facility], [address] [place], [place] [address]    Culture 04/03/2024    Final                   Value:FEW PSEUDOMONAS AERUGINOSA RARE ACINETOBACTER CALCOACETICUS/BAUMANNII COMPLEX    Report Status 04/03/2024 04/07/2024 FINAL   Final   Organism ID, Bacteria 04/03/2024 PSEUDOMONAS AERUGINOSA   Final   Organism ID, Bacteria 04/03/2024 ACINETOBACTER CALCOACETICUS/BAUMANNII COMPLEX   Final  Admission on 03/25/2024, Discharged on 03/28/2024  Component Date Value Ref Range Status   Glucose-Capillary 03/25/2024 350 (H)  70 - 99 mg/dL Final   Glucose reference range applies only to samples taken after fasting for at least 8 hours.   WBC 03/25/2024 18.0 (H)  4.0 - 10.5 K/uL Final   RBC 03/25/2024 4.08 (L)  4.22 -  5.81 MIL/uL Final   Hemoglobin 03/25/2024 10.8 (L)  13.0 - 17.0 g/dL Final   HCT 91/47/8295 33.7 (L)  39.0 - 52.0 % Final   MCV 03/25/2024 82.6  80.0 - 100.0 fL Final   MCH 03/25/2024 26.5  26.0 - 34.0 pg Final   MCHC 03/25/2024 32.0  30.0 - 36.0 g/dL Final   RDW 62/13/0865 13.7  11.5 - 15.5 % Final   Platelets 03/25/2024 340  150 - 400 K/uL Final   nRBC 03/25/2024 0.0  0.0 - 0.2 % Final   Performed at [facility], [address], [place], [place] [address]   Color, Urine 03/25/2024 AMBER (A)  YELLOW Final   BIOCHEMICALS MAY BE AFFECTED BY COLOR   APPearance 03/25/2024 CLOUDY (A)  CLEAR Final   Specific Gravity, Urine 03/25/2024 1.015  1.005 - 1.030 Final   pH 03/25/2024 5.0  5.0 - 8.0 Final   Glucose, UA 03/25/2024 >=500 (A)  NEGATIVE mg/dL Final   Hgb urine dipstick 03/25/2024 SMALL (A)  NEGATIVE Final   Bilirubin Urine 03/25/2024 NEGATIVE  NEGATIVE Final   Ketones, ur 03/25/2024 5 (A)  NEGATIVE mg/dL Final   Protein, ur 62/95/2841 100 (A)  NEGATIVE mg/dL Final   Nitrite 32/44/0102 NEGATIVE  NEGATIVE Final   Leukocytes,Ua 03/25/2024 NEGATIVE  NEGATIVE Final   RBC / HPF 03/25/2024 0-5  0 - 5 RBC/hpf Final   WBC, UA 03/25/2024 11-20  0 - 5 WBC/hpf Final   Bacteria, UA 03/25/2024 RARE (A)  NONE SEEN Final   Squamous Epithelial / HPF 03/25/2024 0-5  0 - 5 /HPF Final   WBC Clumps 03/25/2024 PRESENT   Final   Mucus 03/25/2024 PRESENT   Final   Hyaline Casts, UA 03/25/2024 PRESENT   Final   Granular Casts, UA 03/25/2024 PRESENT   Final   Performed at [facility], [address], [place], [place] [address]   Glucose-Capillary 03/25/2024 409 (H)  70 - 99 mg/dL Final  Glucose reference range applies only to samples taken after fasting for at least 8 hours.   pH, Ven 03/25/2024 7.417  7.25 - 7.43 Final   pCO2, Ven 03/25/2024 38.2 (L)  44 - 60 mmHg Final   pO2, Ven 03/25/2024 30 (LL)  32 - 45 mmHg Final   Bicarbonate 03/25/2024 24.6  20.0 - 28.0 mmol/L Final   TCO2  03/25/2024 26  22 - 32 mmol/L Final   O2 Saturation 03/25/2024 58  % Final   Acid-Base Excess 03/25/2024 0.0  0.0 - 2.0 mmol/L Final   Sodium 03/25/2024 129 (L)  135 - 145 mmol/L Final   Potassium 03/25/2024 3.9  3.5 - 5.1 mmol/L Final   Calcium , Ion 03/25/2024 1.05 (L)  1.15 - 1.40 mmol/L Final   HCT 03/25/2024 35.0 (L)  39.0 - 52.0 % Final   Hemoglobin 03/25/2024 11.9 (L)  13.0 - 17.0 g/dL Final   Sample type 09/81/1914 VENOUS   Final   Comment 03/25/2024 NOTIFIED PHYSICIAN   Final   Sodium 03/25/2024 128 (L)  135 - 145 mmol/L Final   Potassium 03/25/2024 4.0  3.5 - 5.1 mmol/L Final   Chloride 03/25/2024 93 (L)  98 - 111 mmol/L Final   CO2 03/25/2024 21 (L)  22 - 32 mmol/L Final   Glucose, Bld 03/25/2024 366 (H)  70 - 99 mg/dL Final   Glucose reference range applies only to samples taken after fasting for at least 8 hours.   BUN 03/25/2024 14  6 - 20 mg/dL Final   Creatinine, Ser 03/25/2024 1.84 (H)  0.61 - 1.24 mg/dL Final   Calcium  03/25/2024 8.2 (L)  8.9 - 10.3 mg/dL Final   GFR, Estimated 03/25/2024 42 (L)  >60 mL/min Final   Comment: (NOTE) Calculated using the CKD-EPI Creatinine Equation (2021)    Anion gap 03/25/2024 14  5 - 15 Final   Performed at [facility], [address] [place], [place] [address]   Sodium 03/25/2024 128 (L)  135 - 145 mmol/L Final   Potassium 03/25/2024 3.9  3.5 - 5.1 mmol/L Final   Chloride 03/25/2024 94 (L)  98 - 111 mmol/L Final   BUN 03/25/2024 16  6 - 20 mg/dL Final   Creatinine, Ser 03/25/2024 1.90 (H)  0.61 - 1.24 mg/dL Final   Glucose, Bld 62/13/0865 370 (H)  70 - 99 mg/dL Final   Glucose reference range applies only to samples taken after fasting for at least 8 hours.   Calcium , Ion 03/25/2024 1.03 (L)  1.15 - 1.40 mmol/L Final   TCO2 03/25/2024 21 (L)  22 - 32 mmol/L Final   Hemoglobin 03/25/2024 12.2 (L)  13.0 - 17.0 g/dL Final   HCT 78/46/9629 36.0 (L)  39.0 - 52.0 % Final   Lactic Acid, Venous 03/25/2024 1.4  0.5 - 1.9 mmol/L  Final   Specimen Description 03/25/2024 BLOOD SITE NOT SPECIFIED   Final   Special Requests 03/25/2024 BOTTLES DRAWN AEROBIC AND ANAEROBIC Blood Culture results may not be optimal due to an inadequate volume of blood received in culture bottles   Final   Culture 03/25/2024    Final                   Value:NO GROWTH 5 DAYS Performed at [facility], [address] [place], [place] [address]    Report Status 03/25/2024 03/31/2024 FINAL   Final   Specimen Description 03/25/2024 BLOOD SITE NOT SPECIFIED   Final   Special  Requests 03/25/2024 BOTTLES DRAWN AEROBIC AND ANAEROBIC Blood Culture results may not be optimal due to an inadequate volume of blood received in culture bottles   Final   Culture 03/25/2024    Final                   Value:NO GROWTH 5 DAYS Performed at [facility], [address], [place], [place] [address]    Report Status 03/25/2024 03/30/2024 FINAL   Final   CRP 03/25/2024 23.8 (H)  <1.0 mg/dL Final   Performed at [facility], [address], [place], [place] [address]   aPTT 03/26/2024 31  24 - 36 seconds Final   Performed at [facility], [address], [place], [place] [address]   Prothrombin Time 03/26/2024 15.4 (H)  11.4 - 15.2 seconds Final   INR 03/26/2024 1.2  0.8 - 1.2 Final   Comment: (NOTE) INR goal varies based on device and disease states. Performed at [facility], [address] [place], [place] [address]    Sodium 03/26/2024 131 (L)  135 - 145 mmol/L Final   Potassium 03/26/2024 3.3 (L)  3.5 - 5.1 mmol/L Final   Chloride 03/26/2024 97 (L)  98 - 111 mmol/L Final   CO2 03/26/2024 23  22 - 32 mmol/L Final   Glucose, Bld 03/26/2024 166 (H)  70 - 99 mg/dL Final   Glucose reference range applies only to samples taken after fasting for at least 8 hours.   BUN 03/26/2024 23 (H)  6 - 20 mg/dL Final   Creatinine, Ser 03/26/2024 2.14 (H)  0.61 - 1.24 mg/dL Final   Calcium  03/26/2024 7.8 (L)  8.9 - 10.3 mg/dL Final   GFR,  Estimated 03/26/2024 35 (L)  >60 mL/min Final   Comment: (NOTE) Calculated using the CKD-EPI Creatinine Equation (2021)    Anion gap 03/26/2024 11  5 - 15 Final   Performed at [facility], [address], [place], [place] [address]   WBC 03/26/2024 15.6 (H)  4.0 - 10.5 K/uL Final   RBC 03/26/2024 3.48 (L)  4.22 - 5.81 MIL/uL Final   Hemoglobin 03/26/2024 9.2 (L)  13.0 - 17.0 g/dL Final   HCT 13/07/6577 27.9 (L)  39.0 - 52.0 % Final   MCV 03/26/2024 80.2  80.0 - 100.0 fL Final   MCH 03/26/2024 26.4  26.0 - 34.0 pg Final   MCHC 03/26/2024 33.0  30.0 - 36.0 g/dL Final   RDW 46/96/2952 13.5  11.5 - 15.5 % Final   Platelets 03/26/2024 285  150 - 400 K/uL Final   nRBC 03/26/2024 0.0  0.0 - 0.2 % Final   Performed at [facility], [address] [place], [place] [address]   HIV Screen 4th Generation wRfx 03/26/2024 Non Reactive  Non Reactive Final   Performed at [facility], [address] [place], [place] [address]   Hgb A1c MFr Bld 03/26/2024 13.1 (H)  4.8 - 5.6 % Final   Comment: (NOTE) Pre diabetes:          5.7%-6.4%  Diabetes:              >6.4%  Glycemic control for   <7.0% adults with diabetes    Mean Plasma Glucose 03/26/2024 329.27  mg/dL Final   Performed at [facility], [address], [place], [place] [address]   Glucose-Capillary 03/25/2024 321 (H)  70 - 99 mg/dL Final  Glucose reference range applies only to samples taken after fasting for at least 8 hours.   Glucose-Capillary 03/26/2024 149 (H)  70 - 99 mg/dL Final   Glucose reference range applies only to samples taken after fasting for at least 8 hours.   Glucose-Capillary 03/26/2024 148 (H)  70 - 99 mg/dL Final   Glucose reference range applies only to samples taken after fasting for at least 8 hours.   Glucose-Capillary 03/26/2024 178 (H)  70 - 99 mg/dL Final   Glucose reference range applies only to samples taken after fasting for at least 8 hours.   Glucose-Capillary 03/26/2024 166  (H)  70 - 99 mg/dL Final   Glucose reference range applies only to samples taken after fasting for at least 8 hours.   Comment 1 03/26/2024 Notify RN   Final   Comment 2 03/26/2024 Document in Chart   Final   Glucose-Capillary 03/26/2024 166 (H)  70 - 99 mg/dL Final   Glucose reference range applies only to samples taken after fasting for at least 8 hours.   Glucose-Capillary 03/26/2024 173 (H)  70 - 99 mg/dL Final   Glucose reference range applies only to samples taken after fasting for at least 8 hours.   Comment 1 03/26/2024 Notify RN   Final   Sodium 03/27/2024 130 (L)  135 - 145 mmol/L Final   Potassium 03/27/2024 3.7  3.5 - 5.1 mmol/L Final   Chloride 03/27/2024 97 (L)  98 - 111 mmol/L Final   CO2 03/27/2024 22  22 - 32 mmol/L Final   Glucose, Bld 03/27/2024 186 (H)  70 - 99 mg/dL Final   Glucose reference range applies only to samples taken after fasting for at least 8 hours.   BUN 03/27/2024 22 (H)  6 - 20 mg/dL Final   Creatinine, Ser 03/27/2024 2.41 (H)  0.61 - 1.24 mg/dL Final   Calcium  03/27/2024 7.8 (L)  8.9 - 10.3 mg/dL Final   GFR, Estimated 03/27/2024 31 (L)  >60 mL/min Final   Comment: (NOTE) Calculated using the CKD-EPI Creatinine Equation (2021)    Anion gap 03/27/2024 11  5 - 15 Final   Performed at [facility], [address], [place], [place] [address]   Magnesium 03/27/2024 2.1  1.7 - 2.4 mg/dL Final   Performed at [facility], [address] [place], [place] [address]   WBC 03/27/2024 12.3 (H)  4.0 - 10.5 K/uL Final   RBC 03/27/2024 3.44 (L)  4.22 - 5.81 MIL/uL Final   Hemoglobin 03/27/2024 8.9 (L)  13.0 - 17.0 g/dL Final   HCT 09/81/1914 27.6 (L)  39.0 - 52.0 % Final   MCV 03/27/2024 80.2  80.0 - 100.0 fL Final   MCH 03/27/2024 25.9 (L)  26.0 - 34.0 pg Final   MCHC 03/27/2024 32.2  30.0 - 36.0 g/dL Final   RDW 78/29/5621 13.9  11.5 - 15.5 % Final   Platelets 03/27/2024 334  150 - 400 K/uL Final   nRBC 03/27/2024 0.0  0.0 - 0.2 % Final    Neutrophils Relative % 03/27/2024 78  % Final   Neutro Abs 03/27/2024 9.5 (H)  1.7 - 7.7 K/uL Final   Lymphocytes Relative 03/27/2024 11  % Final   Lymphs Abs 03/27/2024 1.4  0.7 - 4.0 K/uL Final   Monocytes Relative 03/27/2024 10  % Final   Monocytes Absolute 03/27/2024 1.2 (H)  0.1 - 1.0 K/uL Final   Eosinophils Relative 03/27/2024 0  % Final  Eosinophils Absolute 03/27/2024 0.0  0.0 - 0.5 K/uL Final   Basophils Relative 03/27/2024 0  % Final   Basophils Absolute 03/27/2024 0.0  0.0 - 0.1 K/uL Final   Immature Granulocytes 03/27/2024 1  % Final   Abs Immature Granulocytes 03/27/2024 0.13 (H)  0.00 - 0.07 K/uL Final   Performed at [facility], [address], [place], [place] [address]   Glucose-Capillary 03/26/2024 232 (H)  70 - 99 mg/dL Final   Glucose reference range applies only to samples taken after fasting for at least 8 hours.   Comment 1 03/26/2024 Notify RN   Final   Glucose-Capillary 03/27/2024 232 (H)  70 - 99 mg/dL Final   Glucose reference range applies only to samples taken after fasting for at least 8 hours.   Glucose-Capillary 03/27/2024 174 (H)  70 - 99 mg/dL Final   Glucose reference range applies only to samples taken after fasting for at least 8 hours.   Comment 1 03/27/2024 Notify RN   Final   Glucose-Capillary 03/27/2024 181 (H)  70 - 99 mg/dL Final   Glucose reference range applies only to samples taken after fasting for at least 8 hours.   Glucose-Capillary 03/27/2024 289 (H)  70 - 99 mg/dL Final   Glucose reference range applies only to samples taken after fasting for at least 8 hours.   Glucose-Capillary 03/27/2024 165 (H)  70 - 99 mg/dL Final   Glucose reference range applies only to samples taken after fasting for at least 8 hours.   Sodium 03/28/2024 131 (L)  135 - 145 mmol/L Final   Potassium 03/28/2024 3.6  3.5 - 5.1 mmol/L Final   Chloride 03/28/2024 98  98 - 111 mmol/L Final   CO2 03/28/2024 23  22 - 32 mmol/L Final   Glucose, Bld 03/28/2024  204 (H)  70 - 99 mg/dL Final   Glucose reference range applies only to samples taken after fasting for at least 8 hours.   BUN 03/28/2024 28 (H)  6 - 20 mg/dL Final   Creatinine, Ser 03/28/2024 2.16 (H)  0.61 - 1.24 mg/dL Final   Calcium  03/28/2024 8.0 (L)  8.9 - 10.3 mg/dL Final   GFR, Estimated 03/28/2024 35 (L)  >60 mL/min Final   Comment: (NOTE) Calculated using the CKD-EPI Creatinine Equation (2021)    Anion gap 03/28/2024 10  5 - 15 Final   Performed at [facility], [address] [place], [place] [address]   WBC 03/28/2024 13.2 (H)  4.0 - 10.5 K/uL Final   RBC 03/28/2024 3.37 (L)  4.22 - 5.81 MIL/uL Final   Hemoglobin 03/28/2024 8.8 (L)  13.0 - 17.0 g/dL Final   HCT 13/07/6577 27.3 (L)  39.0 - 52.0 % Final   MCV 03/28/2024 81.0  80.0 - 100.0 fL Final   MCH 03/28/2024 26.1  26.0 - 34.0 pg Final   MCHC 03/28/2024 32.2  30.0 - 36.0 g/dL Final   RDW 46/96/2952 14.0  11.5 - 15.5 % Final   Platelets 03/28/2024 353  150 - 400 K/uL Final   nRBC 03/28/2024 0.0  0.0 - 0.2 % Final   Neutrophils Relative % 03/28/2024 90  % Final   Neutro Abs 03/28/2024 11.9 (H)  1.7 - 7.7 K/uL Final   Lymphocytes Relative 03/28/2024 4  % Final   Lymphs Abs 03/28/2024 0.5 (L)  0.7 - 4.0 K/uL Final   Monocytes Relative 03/28/2024 5  % Final   Monocytes Absolute 03/28/2024 0.7  0.1 - 1.0 K/uL Final   Eosinophils Relative 03/28/2024 1  % Final   Eosinophils Absolute 03/28/2024 0.1  0.0 - 0.5 K/uL Final   Basophils Relative 03/28/2024 0  % Final   Basophils Absolute 03/28/2024 0.0  0.0 - 0.1 K/uL Final   WBC Morphology 03/28/2024 See Note   Final   Morphology unremarkable   RBC Morphology 03/28/2024 See Note   Final   Morphology unremarkable   Smear Review 03/28/2024 See Note   Final   Normal Platelet Morphology   nRBC 03/28/2024 0  0 /100 WBC Final   Abs Immature Granulocytes 03/28/2024 0.00  0.00 - 0.07 K/uL Final   Performed at [facility], [address], [place], [place] [address]    Glucose-Capillary 03/27/2024 242 (H)  70 - 99 mg/dL Final   Glucose reference range applies only to samples taken after fasting for at least 8 hours.   Comment 1 03/27/2024 Notify RN   Final   Glucose-Capillary 03/27/2024 232 (H)  70 - 99 mg/dL Final   Glucose reference range applies only to samples taken after fasting for at least 8 hours.   Comment 1 03/27/2024 Notify RN   Final   Glucose-Capillary 03/28/2024 206 (H)  70 - 99 mg/dL Final   Glucose reference range applies only to samples taken after fasting for at least 8 hours.   Comment 1 03/28/2024 Notify RN   Final   Glucose-Capillary 03/28/2024 195 (H)  70 - 99 mg/dL Final   Glucose reference range applies only to samples taken after fasting for at least 8 hours.   Glucose-Capillary 03/28/2024 268 (H)  70 - 99 mg/dL Final   Glucose reference range applies only to samples taken after fasting for at least 8 hours.   Glucose-Capillary 03/28/2024 217 (H)  70 - 99 mg/dL Final   Glucose reference range applies only to samples taken after fasting for at least 8 hours.    Blood Alcohol level:  Lab Results  Component Value Date   [facility] <15 05/11/2024    Metabolic Disorder Labs: Lab Results  Component Value Date   HGBA1C 13.1 (H) 03/26/2024   MPG 329.27 03/26/2024   MPG 283.35 05/07/2022   No results found for: "PROLACTIN" Lab Results  Component Value Date   CHOL 111 05/11/2024   TRIG 106 05/11/2024   HDL 28 (L) 05/11/2024   CHOLHDL 4.0 05/11/2024   VLDL 21 05/11/2024   LDLCALC 62 05/11/2024   LDLCALC 45 12/14/2021    Therapeutic Lab Levels: No results found for: "LITHIUM" No results found for: "VALPROATE" No results found for: "CBMZ"  Physical Findings   GAD-7    Flowsheet Row Office Visit from 09/11/2022 in [facility] Dept Of [place]. [facility] Office Visit from 07/06/2022 in [facility] - A Dept Of [place]. [facility] Office Visit from 06/05/2022 in [facility] - A Dept Of [place]. [facility]  Total GAD-7 Score 0 0 4      PHQ2-9    Flowsheet Row Office Visit from 08/28/2023 in [facility] Office Visit from 09/11/2022 in [facility] - A Dept Of [place]. [facility] Office Visit from 08/01/2022 in [facility] Office Visit from 07/06/2022 in [facility] [place] - A Dept Of [place] [facility]. [facility] Office Visit from 06/05/2022 in [facility] - A Dept Of [place]. [facility]  PHQ-2 Total Score 0 0 0 0 3  PHQ-9 Total Score -- 0 -- 2 8      Flowsheet Row ED from 05/11/2024 in [facility] ED to Hosp-Admission (Discharged) from 04/17/2024 in [facility] ED to Hosp-Admission (Discharged) from 03/25/2024 in [facility]  C-SSRS RISK CATEGORY No Risk No Risk No Risk        Musculoskeletal  Strength & Muscle Tone: within normal limits Gait & Station: unsteady without walker but okay with walker.  Patient leans: N/A  Psychiatric Specialty Exam  Presentation  General Appearance:  Appropriate for Environment  Eye Contact: Good  Speech: Clear and Coherent  Speech Volume: Normal  Handedness: Right   Mood and Affect  Mood: Depressed  Affect: Tearful   Thought Process  Thought Processes: Coherent; Goal Directed  Descriptions of Associations:Intact  Orientation:Full (Time, Place and Person)  Thought Content:WDL  Diagnosis of Schizophrenia or Schizoaffective disorder in past: No    Hallucinations:Hallucinations: None  Ideas of Reference:None  Suicidal Thoughts:Suicidal Thoughts: Yes, Passive SI Passive Intent and/or Plan: Without Plan; Without Intent  Homicidal Thoughts:Homicidal Thoughts: No   Sensorium  Memory: Immediate Good; Recent Good; Remote  Good  Judgment: Fair  Insight: Good   Executive Functions  Concentration: Good  Attention Span: Good  Recall: Good  Fund of Knowledge: Good  Language: Good   Psychomotor Activity  Psychomotor Activity: Psychomotor Activity: Normal   Assets  Assets: Communication Skills; Desire for Improvement; Housing; Social Support   Sleep  Sleep: Sleep: Fair Number of Hours of Sleep: 4   Nutritional Assessment (For OBS and FBC admissions only) Has the patient had a weight loss or gain of 10 pounds or more in the last 3 months?: No Has the patient had a decrease in food intake/or appetite?: No Does the patient have dental problems?: No Does the patient have eating habits or behaviors that may be indicators of an eating disorder including binging or inducing vomiting?: No Has the patient recently lost weight without trying?: 0 Has the patient been eating poorly because of a decreased appetite?: 0 Malnutrition Screening Tool Score: 0    Physical Exam  Physical Exam Vitals and nursing note reviewed.  Constitutional:      General: He is not in acute distress.    Appearance: He is well-developed.  HENT:     Head: Normocephalic and atraumatic.  Pulmonary:     Effort: Pulmonary effort is normal. No respiratory distress.  Musculoskeletal:        General: No swelling.  Skin:    Comments: R foot s/p amputation to the metatarsal - site has dehisent wound with possible purulence but without severe tenderness, erythema, discharge. Wound dressing was soaked and had not been changed in while and was malodorous.   Neurological:     General: No focal deficit present.     Mental Status: He is alert.    Review of Systems  Constitutional:  Negative for fever.  Cardiovascular:  Negative for chest pain and palpitations.  Gastrointestinal:  Negative for constipation, diarrhea, nausea and vomiting.  Neurological:  Negative for dizziness, weakness and headaches.            Blood pressure 91/65, pulse 86, temperature 98.5 F (36.9 C), temperature source Oral, resp. rate 18, SpO2 100%. There is no height or weight on file to calculate BMI.  Treatment  Plan Summary: Daily contact with patient to assess and evaluate symptoms and progress in treatment, Medication management, and Plan to get patient into CDIOP.  Secure chat with Quirino Buckles started for recommending him to CDIOP.  Infectious disease office said that he can transferred to their facility for which we will get him a taxi, so that he can take out his PICC line and make other recommendations regarding his bilateral lower extremity wounds.    Verdell Given, MD PGY-1 Psychiatry Resident 05/12/2024, 2:09 PM

## 2024-05-12 NOTE — Patient Instructions (Addendum)
 Nice to see you.  We will check your lab work today.  Continue wound care with follow up per Dr. Althea Atkinson.  Antibiotic plan to be determined following your visit with Dr. Althea Atkinson.  Continue to emphasize protein intake to aid in healing.   Have a great day and stay safe!

## 2024-05-12 NOTE — Assessment & Plan Note (Addendum)
 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 Has completed 2.5 weeks of antimicrobial therapy with Daptomycin  and Meropenem . No obvious signs of infection. There is a small area in the center that appears open where a suture may have been and is without drainage. Discussed plan of care to follow up with Dr. Althea Atkinson tomorrow and will check lab work today to determine need for additional oral antibiotics. Unfortunately there are no oral options for the Klebsiella and would need Ciprofloxacin  and doxycycline  if we were to continue. For now continue with wound care and plan for follow up pending blood work results.   AFTER VISIT EVENT: Following PICC removal [name] was noted to have a blood pressure of 84/60 which improved following a drink of water. No active bleeding and asymptomatic. Advised to hold losartan  for the next 24-48 hours and increase fluid intake. [name] and his brother were instructed to seek Emergency Care if symptoms develop or if confusion is noted.

## 2024-05-12 NOTE — ED Notes (Signed)
Patient observed resting quietly, eyes closed. Respirations equal and unlabored. Will continue to monitor for safety.  

## 2024-05-12 NOTE — Progress Notes (Signed)
 Subjective:   Patient ID: Nichols Corter, male    DOB: September 09, 1966, 58 y.o.   MRN: [id number]  Chief Complaint  Patient presents with   Follow-up    Diabetic foot infection    HPI:  Riese Hellard is a 58 y.o. male with diabetic foot infection complicated by MRSA bacteremia last seen by Dr. Fawn Hooks on 04/29/2024 during hospitalization with plan for at least 2 weeks of daptomycin  and meropenem  in the setting of MRSA, ESBL Klebsiella, and Pseudomonas aeruginosa. Surgical pathology with no evidence of osteomyelitis at the proximal end. Initial inflammatory markers with CRP 12.8 and Sed rate 109. Lab work from 05/01/24 with CRP 18. Did have an episode of cocaine use and seen by [facility]. Here today for follow up.  Mr. Barstow has been doing okay since last being seen and has continued to take Daptomycin  and Meropenem  as prescribed with no adverse side effects or missed doses. PICC line slightly pulled out and dressing has not been changed in over 1 week. Had an episode of cocaine usage following the passing of his aunt. Arrived here today from [facility]. Performing wound care. Feels like when he lets it air out without bandage it feels like it heals better. No fevers or chills.    No Known Allergies    Outpatient Medications Prior to Visit  Medication Sig Dispense Refill   Accu-Chek Softclix Lancets lancets Use to check blood sugar 3 times daily. 100 each 6   ascorbic acid  (VITAMIN C ) 500 MG tablet Take 1 tablet (500 mg total) by mouth daily. 30 tablet 2   bisacodyl  (DULCOLAX) 5 MG EC tablet Take 2 tablets (10 mg total) by mouth at bedtime as needed for moderate constipation. 30 tablet 0   Blood Glucose Monitoring Suppl (ACCU-CHEK GUIDE) w/Device KIT Use to check blood sugar 3 times daily. 1 kit 0   Continuous Blood Gluc Transmit (DEXCOM G6 TRANSMITTER) MISC Use to check blood sugar three times daily. Change transmitter once every 909 days. E11.69 1 each 1   cyanocobalamin  1000 MCG  tablet Take 1 tablet (1,000 mcg total) by mouth daily. 90 tablet 0   gabapentin  (NEURONTIN ) 300 MG capsule Take 1 capsule (300 mg total) by mouth at bedtime. 30 capsule 3   glucose blood (ACCU-CHEK GUIDE TEST) test strip Use to check blood sugar 3 times daily. 100 each 6   insulin  aspart (NOVOLOG ) 100 UNIT/ML FlexPen Inject 0-15 Units into the skin 3 (three) times daily before meals.  Correction coverage: Moderate (average weight, post-op) CBG 70 - 120: 0 units CBG 121 - 150: 2 units CBG 151 - 200: 3 units CBG 201 - 250: 5 units CBG 251 - 300: 8 units CBG 301 - 350: 11 units CBG 351 - 400: 15 units CBG > 400: call MD 15 mL 11   Insulin  NPH, Human,, Isophane, (HUMULIN  N KWIKPEN) 100 UNIT/ML Kiwkpen Inject 22 Units into the skin 2 (two) times daily. 30 mL 5   Insulin  Pen Needle 32G X 4 MM MISC Use as directed to inject insulin  up to 4 times daily. 100 each 0   iron  polysaccharides (NIFEREX) 150 MG capsule Take 1 capsule (150 mg total) by mouth daily. 90 capsule 0   losartan  (COZAAR ) 25 MG tablet Take 1 tablet (25 mg total) by mouth daily. 30 tablet 11   metFORMIN  (GLUCOPHAGE -XR) 500 MG 24 hr tablet Take 2 tablets (1,000 mg total) by mouth 2 (two) times daily with a meal. 120 tablet 6  methocarbamol  (ROBAXIN ) 750 MG tablet Take 1 tablet (750 mg total) by mouth every 6 (six) hours as needed for muscle spasms. 45 tablet 0   nitroGLYCERIN  (NITRODUR - DOSED IN MG/24 HR) 0.2 mg/hr patch Place 1 patch (0.2 mg total) onto the skin daily. 30 patch 12   polyethylene glycol powder (GLYCOLAX /MIRALAX ) 17 GM/SCOOP powder Take 17 g by mouth 2 (two) times daily. Mix as directed. (Patient taking differently: Take 17 g by mouth 2 (two) times daily as needed for mild constipation. Mix as directed.) 238 g 0   Vitamin D , Ergocalciferol , (DRISDOL ) 1.25 MG (50000 UNIT) CAPS capsule Take 1 capsule (50,000 Units total) by mouth every 7 (seven) days. (Patient taking differently: Take 50,000 Units by mouth every Tuesday.)  12 capsule 0   No facility-administered medications prior to visit.     Past Medical History:  Diagnosis Date   Diabetes mellitus without complication (HCC)    Tuberculosis    tested positive,treated with medications.     Past Surgical History:  Procedure Laterality Date   AMPUTATION TOE Right 03/26/2024   Procedure: AMPUTATION, TOE;  Surgeon: Timothy Ford, MD;  Location: [facility] OR;  Service: Orthopedics;  Laterality: Right;  RIGHT GREAT TOE AMPUTATION   BONE BIOPSY  04/18/2024   Procedure: BIOPSY, BONE (2ND METATARSAL);  Surgeon: Anell Baptist, DPM;  Location: [facility] ORS;  Service: Orthopedics/Podiatry;;   FOOT SURGERY Left    I & D EXTREMITY Left 05/05/2022   Procedure: LEFT FOOT DEBRIDEMENT;  Surgeon: Timothy Ford, MD;  Location: [facility] OR;  Service: Orthopedics;  Laterality: Left;   IRRIGATION AND DEBRIDEMENT FOOT Right 04/18/2024   Procedure: IRRIGATION AND DEBRIDEMENT FOOT, PLACEMENT OF ANTIBIOTIC BEADS;  Surgeon: Anell Baptist, DPM;  Location: [facility] ORS;  Service: Orthopedics/Podiatry;  Laterality: Right;   MYRINGOTOMY WITH TUBE PLACEMENT Bilateral 07/20/2022   Procedure: MYRINGOTOMY WITH TUBE PLACEMENT;  Surgeon: Juengel, Paul, MD;  Location: [facility];  Service: ENT;  Laterality: Bilateral;  Diabetic   TONSILLECTOMY     TRANSMETATARSAL AMPUTATION Right 04/23/2024   Procedure: AMPUTATION, FOOT, TRANSMETATARSAL;  Surgeon: Anell Baptist, DPM;  Location: [facility] ORS;  Service: Orthopedics/Podiatry;  Laterality: Right;       Review of Systems  Constitutional:  Negative for appetite change, chills, fatigue, fever and unexpected weight change.  Eyes:  Negative for visual disturbance.  Respiratory:  Negative for cough, chest tightness, shortness of breath and wheezing.   Cardiovascular:  Negative for chest pain and leg swelling.  Gastrointestinal:  Negative for abdominal pain, constipation, diarrhea, nausea and vomiting.  Genitourinary:  Negative for dysuria, flank pain,  frequency, genital sores, hematuria and urgency.  Skin:  Negative for rash.  Allergic/Immunologic: Negative for immunocompromised state.  Neurological:  Negative for dizziness and headaches.    Objective:   BP (!) 84/58 Comment: MAP 66  Pulse 88   Temp 97.9 F (36.6 C) (Oral)   SpO2 100%  Nursing note and vital signs reviewed.  Physical Exam Constitutional:      General: He is not in acute distress.    Appearance: He is well-developed.  Eyes:     Conjunctiva/sclera: Conjunctivae normal.  Cardiovascular:     Rate and Rhythm: Normal rate and regular rhythm.     Heart sounds: Normal heart sounds. No murmur heard.    No friction rub. No gallop.  Pulmonary:     Effort: Pulmonary effort is normal. No respiratory distress.     Breath sounds: Normal breath sounds. No wheezing or rales.  Chest:     Chest wall: No tenderness.  Abdominal:     General: Bowel sounds are normal.     Palpations: Abdomen is soft.     Tenderness: There is no abdominal tenderness.  Musculoskeletal:     Cervical back: Neck supple.  Lymphadenopathy:     Cervical: No cervical adenopathy.  Skin:    General: Skin is warm and dry.     Findings: No rash.  Neurological:     Mental Status: He is alert and oriented to person, place, and time.                08/28/2023   11:44 AM 09/11/2022    3:03 PM 08/01/2022    9:16 AM 07/06/2022    2:26 PM 06/05/2022    9:49 AM  Depression screen PHQ 2/9  Decreased Interest 0 0  0 3  Down, Depressed, Hopeless 0 0 0 0 0  PHQ - 2 Score 0 0 0 0 3  Altered sleeping  0  2 3  Tired, decreased energy  0   2  Change in appetite  0  0 0  Feeling bad or failure about yourself   0  0 0  Trouble concentrating  0  0 0  Moving slowly or fidgety/restless  0  0 0  Suicidal thoughts  0  0 0  PHQ-9 Score  0  2 8     Assessment & Plan:    Patient Active Problem List   Diagnosis Date Noted   MRSA bacteremia 04/21/2024   HLD (hyperlipidemia) 04/17/2024   Chronic kidney  disease, stage 3a (HCC) 04/17/2024   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   Normocytic anemia 04/17/2024   Amputation of right great toe (HCC) 03/31/2024   Necrotizing soft tissue infection 03/26/2024   Hypertension associated with diabetes (HCC) 12/12/2022   Toe osteomyelitis, left (HCC) 12/12/2022   Pyogenic inflammation of bone (HCC) 06/02/2022   PICC (peripherally inserted central catheter) in place 06/02/2022   Medication management 06/02/2022   Diabetic foot infection (HCC) 06/02/2022   Smoking 06/02/2022   Tobacco abuse 05/06/2022   Hyperkalemia 05/06/2022   Type 2 diabetes mellitus (HCC) 05/05/2022   Obesity (BMI 30-39.9) 05/05/2022   Cellulitis and abscess of foot    AKI (acute kidney injury) (HCC) 05/04/2022   Injury of left foot    Diabetic foot ulcer (HCC) 12/14/2021     Problem List Items Addressed This Visit       Endocrine   Diabetic foot infection [facility])   Mr. 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 Has completed 2.5 weeks of antimicrobial therapy with Daptomycin  and Meropenem . No obvious signs of infection. There is a small area in the center that appears open where a suture may have been and is without drainage. Discussed plan of care to follow up with Dr. Althea Atkinson tomorrow and will check lab work today to determine need for additional oral antibiotics. Unfortunately there are no oral options for the Klebsiella and would need Ciprofloxacin  and doxycycline  if we were to continue. For now continue with wound care and plan for follow up pending blood work results.   AFTER VISIT EVENT: Following PICC removal Mr. Koska was noted to have a blood pressure of 84/60 which improved following a drink of water. No active bleeding and asymptomatic. Advised to hold losartan  for the next 24-48 hours and increase fluid intake. Mr.  Drewes and his brother were instructed to seek  Emergency Care if symptoms develop or if confusion is noted.       Relevant Orders   Basic metabolic panel with GFR   CBC with Differential/Platelet   C-reactive protein   Sedimentation rate     Other   PICC (peripherally inserted central catheter) in place   PICC line appeared partially removed and was removed per note without complication.       MRSA bacteremia - Primary   Relevant Orders   Basic metabolic panel with GFR   CBC with Differential/Platelet   C-reactive protein   Sedimentation rate     I am having Starling Eck maintain his Dexcom G6 Transmitter, gabapentin , Insulin  Pen Needle, metFORMIN , methocarbamol , nitroGLYCERIN , Accu-Chek Guide Test, Accu-Chek Guide, Accu-Chek Softclix Lancets, losartan , HumuLIN  N KwikPen, bisacodyl , polyethylene glycol powder, cyanocobalamin , iron  polysaccharides, ascorbic acid , Vitamin D  (Ergocalciferol ), and insulin  aspart.   Follow-up: Pending blood work if needed.   Marlan Silva, MSN, FNP-C Nurse Practitioner [facility] Main number: [phone]

## 2024-05-12 NOTE — Progress Notes (Signed)
 PICC Removal    PICC length & location: right brachial 39 cm Removed per verbal order from: Marlan Silva, NP  Blood thinners: none per chart review Platelet count: 345  Site assessment: Dressing integrity compromised with exposed catheter (see media tab for photo). Catheter out about 15 cm, dressing has shifted so that CHG patch was no longer over insertion site. Piece of tape used for reinforcement was directly over insertion site. No redness, drainage, or swelling noted.   Pre-removal vital signs:  BP: 97/61 HR: 92 SpO2: 98%  Insertion site positioned below level of heart. No sutures present. Insertion site cleaned with CHG, catheter removed and petroleum dressing applied. Tip intact. Pressure held until hemostasis achieved.    Length of catheter removed: 39 cm   Provided patient with after care instructions and precautions print out (via [employer] Clinical Key). Reviewed this information with patient.   Patient verbalized understanding and agreement, all questions answered. Patient tolerated procedure well and remained in clinic under the care of RN 30 minutes post removal.  Post-observation vital signs:  BP: 84/58 - provider notified. No bleeding from PICC removal site. Provider in exam room to take manual BP HR: 88 SpO2: 100%  Notified [facility] pharmacy team of removal.  Arlon Bergamo, BSN, RN

## 2024-05-12 NOTE — Discharge Instructions (Addendum)
 The intensive outpatient substance use support group on M W F will call you for intake. Please check your phone. Continue to do wound dressings every 3 days. Please return to [facility] if you are having issues getting established with CD-IOP (chemical dependence intensive outpatient).    Follow-up recommendations:  Activity:  Normal, as tolerated Diet:  Per PCP recommendation  Patient is instructed prior to discharge to:  Take all medications as prescribed by mental healthcare provider. Report any adverse effects and/or reactions from the medicines to outpatient provider promptly. To not engage in substance use while on psychiatric medicines.  In the event of worsening symptoms, patient is instructed to call the crisis hotline at 988, 911, or go to the nearest ED for appropriate evaluation and treatment of symptoms. To follow-up with primary care provider for your other medical issues, concerns and, or healthcare needs.  -----------------------  [facility]/Substance Abuse Adult The United Way's "211" is a great source of information about community services available.  Access by dialing 2-1-1 from anywhere in [place] , or by website -  PooledIncome.pl.   Other Local Resources (Updated 10/2016)  Crisis Hotlines   Services     Area Served  [employer] Crisis Hotline, available 24 hours a day, 7 days a week: 587-795-2227 [facility], [place]  [facility] Crisis Hotline, available 24 hours a day, 7 days a week: [phone] [facility], [place]  [facility] Suicide Prevention Hotline, available 24 hours a day, 7 days a week: 805-223-6424 [facility], [place]  [employer], available 24 hours a day, 7 days a week: 567-533-3607 or [phone] [facility], [place]   [facility] Access to [employer], available 24 hours a day, 7 days a week: 410-383-5216 All   [facility]  Crisis Hotline, available 24 hours a day, 7 days a week: 587 167 8189 All   Other Local Resources (Updated 10/2016)  Outpatient Counseling/ Substance Abuse Programs  Services     Address and Phone Number  ADS (Alcohol and Drug Services)  Options include Individual counseling, group counseling, intensive outpatient program (several hours a day, several days a week) Offers depression assessments Provides methadone maintenance program [phone] [address], [address] [place], [place] [address]   [facility]  Offers partial hospitalization/day treatment and DUI/DWI programs [employer], private insurance [phone] [address] [place], [place] [address]  [facility], [place], private insurance [phone] [address] [place], [place]  [facility]   Services include intensive outpatient program (several hours a day, several days a week), outpatient treatment, DUI/DWI services, family education Also has some services specifically for [employer] transitional housing  [phone] [address] [place], [place] [address]     [facility] Accepts Medicare, private pay, and private insurance [phone] [address] [place], [place] [address]  [employer] of Care Services include individual counseling, substance abuse intensive outpatient program (several hours a day, several days a week), day treatment Cleave Curling, Medicaid, private insurance [phone] [address] [place], [place] [address]  [facility]  Offers substance abuse intensive outpatient program (several hours a day, several days a week), partial hospitalization program [phone] [address] [place], [place] [address]  [phone] [address] [place], [place] [address]  [phone] [address] [place], [place] [address]  [phone], [address] [place], [place] [address]  [facility] Individual counseling only Accepts private insurance only [phone] [address] [place], [place] [address]  [facility] Methadone maintenance program [phone] [address] [place], [place] [address]  [facility] providing substance abuse and mental health counseling Accepts Medicaid, Medicare, private insurance Offers sliding scale for uninsured [phone] [address] [employer], [place]   [facility] [employer]. Offers individual counseling, and intensive in-home services [phone] [address] [place], [place] [address]  [facility] [employer]	[place] individual counseling, family counseling, group therapy, domestic violence counseling, consumer credit counseling Accepts Medicare, Medicaid, private insurance Offers sliding scale for uninsured [phone] [address] [place], [place] [address]  [phone] [facility], [address] [place], [place] [address]  [facility] Offers individual, family and group counseling 3 locations - [place], [place], and [place]  [phone]  [address]  [place], [place] [address]  [address] [place], [place] [address]  [address] [place], [place] [address]  [facility]   Offers psychiatric assessment, 8-week Intensive Outpatient Program (several hours a day, several times a week, daytime or evenings), early recovery group, family Program, medication management Private pay or private insurance only [phone], or  [phone] [address] [place], [place] [address]  [employer] individual, couples and family counseling Accepts Medicaid, private insurance, and sliding scale for uninsured [phone] [address] [place], [place] [address]  Myrick Ask, MD Individual  counseling Private insurance [phone] [address] [place], [place] [address]  [facility]  Offers assessment, substance abuse treatment, and behavioral health treatment [phone] [address] [place], [place] [address]  [facility] Individual counseling Accepts private insurance [phone] [address] [place], [place] [address]  [facility] Individual counseling Cleave Curling, private insurance [phone] [address] [place], [place] [address]  [facility]   Serves children and adolescents ages 36 to 90 years of age Offers 90-day or longer intensive outpatient programs (3 hours a day, 3 days a week) Cost is $5000/month. Facility is out of network with all insurance companies. Accepts private pay. Does not accept Medicaid.   Offers scholarships and financial assistance [phone] [facility] [place], [place]  [facility] Individual counseling Medicare, private insurance [phone] [address] [place], [place] [address]  [facility]   Offers intensive outpatient program (several hours a day, several times a week) and partial hospitalization program [phone] [address] [place], [place] [address]  Renetta Carter, MD Individual counseling [phone] [address] [place], [place] [address]  [facility] Offers Christian counseling to individuals, couples, and families Accepts Medicare and private insurance; offers sliding scale for uninsured [phone] [address] [place], [place] [address]  [facility] counseling [phone] [address] [place], [place] [address]  [facility] [employer] crisis counseling, individual counseling, group therapy, in-home therapy, domestic violence services, day treatment, DWI services,  Administrator, arts (CST), Doctor, hospital (ACTT), substance abuse Intensive Outpatient Program (several hours a day, several times a week) 2 locations - [place] and [place] [phone] [address] [place], [place] [address]  [phone] [address] [place], [place] [address]  [facility]    Individual counseling and group therapy Accepts private insurance, [place], [place] [phone] [address] [place], [place]  [facility] Offers individual and  family counseling Offers LGBTQ services Accepts private insurance and private pay [phone] [address] [place], [place] [address]  [facility] Full service practice for adult and pediatric patients Monday-Friday; 8:00a - 5:00p [phone] (Multiple locations)  [facility] [employer] individual counseling, group therapy, and outpatient detox Accepts private insurance [phone] [address] [place], [place]  [facility] Individual counseling Accepts Medicare, private insurance [phone] [address] [place], [place] [address]  [employer] Individual counseling Accepts Medicare, private insurance [phone] [address] [place], [place] [address]  [facility]  Offers substance abuse Intensive Outpatient Program (several hours a day, several times a week) [phone], or [phone] [place], [place]

## 2024-05-12 NOTE — Telephone Encounter (Signed)
 Received a secure chat from Dr. 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   Per Dr. Francee Inch ok to d/c picc line and no need for oral abx.  Patient will be seen here at [facility] today to have picc line removed and follow up with Erla Haw, NP.  Per Dr. Almer Jacobson they will be assisting  the patient outpatient cocaine support MWF. Patient was declined by residential treatment centers.  Per Dr. Althea Atkinson he would also like to keep patient's sutures to remain in for 1 more week.  Nathali Vent Adel Holt, CMA

## 2024-05-12 NOTE — Assessment & Plan Note (Signed)
 PICC line appeared partially removed and was removed per note without complication.

## 2024-05-12 NOTE — ED Notes (Signed)
 Patient resting quietly in bed with eyes closed. Unlabored breathing. NAD noted. Facility protocol safety checks in place. Pt is currently safe on the unit.

## 2024-05-12 NOTE — ED Notes (Signed)
 Patient discharged in no acute distress. Belongings returned from locker # 27. AVS given and reviewed with pt voicing understanding of resources. Taxi voucher offered but pt declined. His brother came to pick pt up.

## 2024-05-12 NOTE — ED Provider Notes (Signed)
 [facility]BC/OBS ASAP Discharge Summary  Date and Time: 05/12/2024 2:47 PM  Name: Danie Diehl  MRN:  [id number]   Discharge Diagnoses:  Final diagnoses:  Cocaine use disorder, severe, dependence (HCC)  Substance induced mood disorder (HCC)    HPI: Dakin Madani is a 58 y.o. male w/ no past psych hx who presents for cocaine detox. 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 See OBS H&P for more details.    Subjective: Today the patient reports that he wants help and is seeking treatment to stop using cocaine and to stabilize him self including his medical issues.  Patient reports that he would like to go to residential treatment.  Patient reports that he would like to be as long of treatment as possible.  Patient endorses depressive symptoms including depressed mood, anhedonia, low energy, issues with sleep, psychomotor retardation but reports that these are all in the setting of his worsening foot ulcers and decreased functional status.  He declines any medication for depression.  Furthermore patient reports anxiety symptoms including feeling anxious, being unable to stop worrying, being irritable/annoyed, worried by different things, and having issues relaxing but he denies any medications to help with this.  Discussed the patient would benefit from therapy and long-term treatment including [facility] if he is not accepted to residential and after residential treatment.  Patient reports that he is able to go back to his brother's house and does not need support housing.  Patient reports that he has been changing his dressings although does not appear that way on exam.  Spoke with the patient's infectious disease team and they recommended getting the PICC line out as he does not need anymore antibiotics.  In secure chat spoke with patient's podiatrist who observed the foot wounds and reported that next up can just be wound changes every 3 days with Betadine  wash and  follow up with his office in 1 week.  Spoke with [facility] staff and recommended patient for their program given that he was not a candidate for residential treatment given his lower extremity wounds and dependency with walker.  Staff acknowledged the messaged.  Told patient have his phone on in case they call him for intake.  Discussed with patient if this does not work out that he can come back to the urgent care to get reestablished.  Stay Summary: Spoke with patient about options for rehab.  LCSW reported patient would not be candidate for residential given his bilateral lower extremity ulcers and given he ambulates with walker.  Change patient's old wound dressings on bilateral lower extremities to reveal dehisent wound but no obvious infection at this time.  Spoke with the infectious disease team who recommended patient go to their office to have a PICC line removed.  Spoke with [facility] who was agreeable to screening patient and phone number for [facility] was provided patient for him to follow up if they do not call him today.  Patient did not want to try medications for depression or anxiety despite having some symptoms of both.  Total Time spent with patient: 1.5 hours  Past Psychiatric History: No psychiatric history Past Medical History: Insulin -dependent type 2 diabetes, constipation, hypertension, back pain, angina Family History: No pertinent Family Psychiatric  History: No pertinent Social History: Currently living at home with his brother.  Reports that his still support is his brother.  Reports has been unemployed since his foot ulcers have worsened.  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 Tobacco Cessation:  N/A, patient does not currently use tobacco products  Current Medications:  Current Facility-Administered Medications  Medication Dose Route Frequency Provider Last Rate Last Admin   acetaminophen  (TYLENOL ) tablet 650 mg  650 mg Oral Q6H  PRN Ajibola, Ene A, NP       alum & mag hydroxide-simeth (MAALOX/MYLANTA) 200-200-20 MG/5ML suspension 30 mL  30 mL Oral Q4H PRN Ajibola, Ene A, NP       ascorbic acid  (VITAMIN C ) tablet 500 mg  500 mg Oral Daily Ajibola, Ene A, NP   500 mg at 05/12/24 0946   cyanocobalamin  (VITAMIN B12) tablet 1,000 mcg  1,000 mcg Oral Daily Ajibola, Ene A, NP   1,000 mcg at 05/12/24 0946   haloperidol (HALDOL) tablet 5 mg  5 mg Oral TID PRN Ajibola, Ene A, NP       And   diphenhydrAMINE (BENADRYL) capsule 50 mg  50 mg Oral TID PRN Ajibola, Ene A, NP       haloperidol lactate (HALDOL) injection 5 mg  5 mg Intramuscular TID PRN Ajibola, Ene A, NP       And   diphenhydrAMINE (BENADRYL) injection 50 mg  50 mg Intramuscular TID PRN Ajibola, Ene A, NP       And   LORazepam (ATIVAN) injection 2 mg  2 mg Intramuscular TID PRN Ajibola, Ene A, NP       haloperidol lactate (HALDOL) injection 10 mg  10 mg Intramuscular TID PRN Ajibola, Ene A, NP       And   diphenhydrAMINE (BENADRYL) injection 50 mg  50 mg Intramuscular TID PRN Ajibola, Ene A, NP       And   LORazepam (ATIVAN) injection 2 mg  2 mg Intramuscular TID PRN Ajibola, Ene A, NP       hydrOXYzine (ATARAX) tablet 25 mg  25 mg Oral TID PRN Ajibola, Ene A, NP   25 mg at 05/11/24 2207   insulin  aspart (novoLOG ) injection 0-15 Units  0-15 Units Subcutaneous TID WC Ajibola, Ene A, NP   3 Units at 05/12/24 1301   insulin  aspart (novoLOG ) injection 0-5 Units  0-5 Units Subcutaneous QHS Ajibola, Ene A, NP       insulin  NPH Human (NOVOLIN N) injection 22 Units  22 Units Subcutaneous QAC breakfast Ajibola, Ene A, NP   22 Units at 05/12/24 0945   And   insulin  NPH Human (NOVOLIN N) injection 22 Units  22 Units Subcutaneous QHS Ajibola, Ene A, NP       iron  polysaccharides (NIFEREX) capsule 150 mg  150 mg Oral Daily Ajibola, Ene A, NP   150 mg at 05/12/24 1214   losartan  (COZAAR ) tablet 25 mg  25 mg Oral Daily Kristain Hu, MD   25 mg at 05/12/24 0946   magnesium  hydroxide (MILK OF MAGNESIA) suspension 30 mL  30 mL Oral Daily PRN Ajibola, Ene A, NP   30 mL at 05/11/24 2207   metFORMIN  (GLUCOPHAGE -XR) 24 hr tablet 1,000 mg  1,000 mg Oral BID WC Ajibola, Ene A, NP   1,000 mg at 05/12/24 3664   methocarbamol  (ROBAXIN ) tablet 750 mg  750 mg Oral Q6H PRN Ajibola, Ene A, NP       polyethylene glycol (MIRALAX  / GLYCOLAX ) packet 17 g  17 g Oral Daily PRN Ajibola, Ene A, NP       traZODone (DESYREL) tablet 50 mg  50 mg Oral QHS PRN Ajibola, Ene A, NP   50 mg at 05/11/24 2207   Vitamin D  (Ergocalciferol ) (DRISDOL ) 1.25 MG (50000 UNIT) capsule 50,000 Units  50,000 Units Oral Q7 days Ajibola, Ene A, NP   50,000 Units at 05/12/24 0946   Current Outpatient Medications  Medication Sig Dispense Refill   Accu-Chek Softclix Lancets lancets Use to check blood sugar 3 times daily. 100 each 6   ascorbic acid  (VITAMIN C ) 500 MG tablet Take 1 tablet (500 mg total) by mouth daily. 30 tablet 2   bisacodyl  (DULCOLAX) 5 MG EC tablet Take 2 tablets (10 mg total) by mouth at bedtime as needed for moderate constipation. 30 tablet 0   Blood Glucose Monitoring Suppl (ACCU-CHEK GUIDE) w/Device KIT Use to check blood sugar 3 times daily. 1 kit 0   Continuous Blood Gluc Transmit (DEXCOM G6 TRANSMITTER) MISC Use to check blood sugar three times daily. Change transmitter once every 909 days. E11.69 1 each 1   cyanocobalamin  1000 MCG tablet Take 1 tablet (1,000 mcg total) by mouth daily. 90 tablet 0   gabapentin  (NEURONTIN ) 300 MG capsule Take 1 capsule (300 mg total) by mouth at bedtime. 30 capsule 3   glucose blood (ACCU-CHEK GUIDE TEST) test strip Use to check blood sugar 3 times daily. 100 each 6   insulin  aspart (NOVOLOG ) 100 UNIT/ML FlexPen Inject 0-15 Units into the skin 3 (three) times daily before meals.  Correction coverage: Moderate (average weight, post-op) CBG 70 - 120: 0 units CBG 121 - 150: 2 units CBG 151 - 200: 3 units CBG 201 - 250: 5 units CBG 251 - 300: 8 units CBG 301  - 350: 11 units CBG 351 - 400: 15 units CBG > 400: call MD 15 mL 11   Insulin  NPH, Human,, Isophane, (HUMULIN  N KWIKPEN) 100 UNIT/ML Kiwkpen Inject 22 Units into the skin 2 (two) times daily. 30 mL 5   Insulin  Pen Needle 32G X 4 MM MISC Use as directed to inject insulin  up to 4 times daily. 100 each 0   iron  polysaccharides (NIFEREX) 150 MG capsule Take 1 capsule (150 mg total) by mouth daily. 90 capsule 0   losartan  (COZAAR ) 25 MG tablet Take 1 tablet (25 mg total) by mouth daily. 30 tablet 11   metFORMIN  (GLUCOPHAGE -XR) 500 MG 24 hr tablet Take 2 tablets (1,000 mg total) by mouth 2 (two) times daily with a meal. 120 tablet 6   methocarbamol  (ROBAXIN ) 750 MG tablet Take 1 tablet (750 mg total) by mouth every 6 (six) hours as needed for muscle spasms. 45 tablet 0   nitroGLYCERIN  (NITRODUR - DOSED IN MG/24 HR) 0.2 mg/hr patch Place 1 patch (0.2 mg total) onto the skin daily. 30 patch 12   polyethylene glycol powder (GLYCOLAX /MIRALAX ) 17 GM/SCOOP powder Take 17 g by mouth 2 (two) times daily. Mix as directed. (Patient taking differently: Take 17 g by mouth 2 (two) times daily as needed for mild constipation. Mix as directed.) 238 g 0   Vitamin D , Ergocalciferol , (DRISDOL ) 1.25 MG (50000 UNIT) CAPS capsule Take 1 capsule (50,000 Units total) by mouth every 7 (seven) days. (Patient taking differently: Take 50,000 Units by mouth every Tuesday.) 12 capsule 0    PTA Medications:  PTA Medications  Medication Sig   Continuous Blood Gluc Transmit (DEXCOM G6 TRANSMITTER) MISC Use to check blood sugar three times daily. Change transmitter once every 909 days. E11.69   gabapentin  (NEURONTIN ) 300 MG capsule Take 1 capsule (300  mg total) by mouth at bedtime.   Insulin  Pen Needle 32G X 4 MM MISC Use as directed to inject insulin  up to 4 times daily.   metFORMIN  (GLUCOPHAGE -XR) 500 MG 24 hr tablet Take 2 tablets (1,000 mg total) by mouth 2 (two) times daily with a meal.   methocarbamol  (ROBAXIN ) 750 MG tablet  Take 1 tablet (750 mg total) by mouth every 6 (six) hours as needed for muscle spasms.   nitroGLYCERIN  (NITRODUR - DOSED IN MG/24 HR) 0.2 mg/hr patch Place 1 patch (0.2 mg total) onto the skin daily.   glucose blood (ACCU-CHEK GUIDE TEST) test strip Use to check blood sugar 3 times daily.   Blood Glucose Monitoring Suppl (ACCU-CHEK GUIDE) w/Device KIT Use to check blood sugar 3 times daily.   Accu-Chek Softclix Lancets lancets Use to check blood sugar 3 times daily.   losartan  (COZAAR ) 25 MG tablet Take 1 tablet (25 mg total) by mouth daily.   Insulin  NPH, Human,, Isophane, (HUMULIN  N KWIKPEN) 100 UNIT/ML Kiwkpen Inject 22 Units into the skin 2 (two) times daily.   bisacodyl  (DULCOLAX) 5 MG EC tablet Take 2 tablets (10 mg total) by mouth at bedtime as needed for moderate constipation.   polyethylene glycol powder (GLYCOLAX /MIRALAX ) 17 GM/SCOOP powder Take 17 g by mouth 2 (two) times daily. Mix as directed. (Patient taking differently: Take 17 g by mouth 2 (two) times daily as needed for mild constipation. Mix as directed.)   cyanocobalamin  1000 MCG tablet Take 1 tablet (1,000 mcg total) by mouth daily.   iron  polysaccharides (NIFEREX) 150 MG capsule Take 1 capsule (150 mg total) by mouth daily.   ascorbic acid  (VITAMIN C ) 500 MG tablet Take 1 tablet (500 mg total) by mouth daily.   Vitamin D , Ergocalciferol , (DRISDOL ) 1.25 MG (50000 UNIT) CAPS capsule Take 1 capsule (50,000 Units total) by mouth every 7 (seven) days. (Patient taking differently: Take 50,000 Units by mouth every Tuesday.)   insulin  aspart (NOVOLOG ) 100 UNIT/ML FlexPen Inject 0-15 Units into the skin 3 (three) times daily before meals.  Correction coverage: Moderate (average weight, post-op) CBG 70 - 120: 0 units CBG 121 - 150: 2 units CBG 151 - 200: 3 units CBG 201 - 250: 5 units CBG 251 - 300: 8 units CBG 301 - 350: 11 units CBG 351 - 400: 15 units CBG > 400: call MD   Facility Ordered Medications  Medication    acetaminophen  (TYLENOL ) tablet 650 mg   alum & mag hydroxide-simeth (MAALOX/MYLANTA) 200-200-20 MG/5ML suspension 30 mL   magnesium hydroxide (MILK OF MAGNESIA) suspension 30 mL   haloperidol (HALDOL) tablet 5 mg   And   diphenhydrAMINE (BENADRYL) capsule 50 mg   haloperidol lactate (HALDOL) injection 5 mg   And   diphenhydrAMINE (BENADRYL) injection 50 mg   And   LORazepam (ATIVAN) injection 2 mg   haloperidol lactate (HALDOL) injection 10 mg   And   diphenhydrAMINE (BENADRYL) injection 50 mg   And   LORazepam (ATIVAN) injection 2 mg   hydrOXYzine (ATARAX) tablet 25 mg   traZODone (DESYREL) tablet 50 mg   polyethylene glycol (MIRALAX  / GLYCOLAX ) packet 17 g   insulin  aspart (novoLOG ) injection 0-15 Units   insulin  aspart (novoLOG ) injection 0-5 Units   Vitamin D  (Ergocalciferol ) (DRISDOL ) 1.25 MG (50000 UNIT) capsule 50,000 Units   iron  polysaccharides (NIFEREX) capsule 150 mg   ascorbic acid  (VITAMIN C ) tablet 500 mg   cyanocobalamin  (VITAMIN B12) tablet 1,000 mcg   methocarbamol  (ROBAXIN ) tablet  750 mg   metFORMIN  (GLUCOPHAGE -XR) 24 hr tablet 1,000 mg   insulin  NPH Human (NOVOLIN N) injection 22 Units   And   insulin  NPH Human (NOVOLIN N) injection 22 Units   losartan  (COZAAR ) tablet 25 mg       08/28/2023   11:44 AM 09/11/2022    3:03 PM 08/01/2022    9:16 AM  Depression screen PHQ 2/9  Decreased Interest 0 0   Down, Depressed, Hopeless 0 0 0  PHQ - 2 Score 0 0 0  Altered sleeping  0   Tired, decreased energy  0   Change in appetite  0   Feeling bad or failure about yourself   0   Trouble concentrating  0   Moving slowly or fidgety/restless  0   Suicidal thoughts  0   PHQ-9 Score  0     Flowsheet Row ED from 05/11/2024 in [facility] ED to Hosp-Admission (Discharged) from 04/17/2024 in [facility] ([facility] ED to Hosp-Admission (Discharged) from 03/25/2024 in [facility]   C-SSRS RISK CATEGORY No Risk No Risk No Risk       Musculoskeletal  Strength & Muscle Tone: within normal limits Gait & Station: unsteady and uses walker due to bilateral lower extremity wounds.  Patient leans: N/A  Psychiatric Specialty Exam  Presentation  General Appearance:  Appropriate for Environment  Eye Contact: Good  Speech: Clear and Coherent  Speech Volume: Normal  Handedness: Right   Mood and Affect  Mood: Depressed  Affect: Tearful   Thought Process  Thought Processes: Coherent; Goal Directed  Descriptions of Associations:Intact  Orientation:Full (Time, Place and Person)  Thought Content:WDL  Diagnosis of Schizophrenia or Schizoaffective disorder in past: No    Hallucinations:Hallucinations: None  Ideas of Reference:None  Suicidal Thoughts:Suicidal Thoughts: Yes, Passive SI Passive Intent and/or Plan: Without Plan; Without Intent  Homicidal Thoughts:Homicidal Thoughts: No   Sensorium  Memory: Immediate Good; Recent Good; Remote Good  Judgment: Fair  Insight: Good   Executive Functions  Concentration: Good  Attention Span: Good  Recall: Good  Fund of Knowledge: Good  Language: Good   Psychomotor Activity  Psychomotor Activity: Psychomotor Activity: Normal   Assets  Assets: Communication Skills; Desire for Improvement; Housing; Social Support   Sleep  Sleep: Sleep: Fair Number of Hours of Sleep: 4   Nutritional Assessment (For OBS and FBC admissions only) Has the patient had a weight loss or gain of 10 pounds or more in the last 3 months?: No Has the patient had a decrease in food intake/or appetite?: No Does the patient have dental problems?: No Does the patient have eating habits or behaviors that may be indicators of an eating disorder including binging or inducing vomiting?: No Has the patient recently lost weight without trying?: 0 Has the patient been eating poorly because of a decreased  appetite?: 0 Malnutrition Screening Tool Score: 0    Physical Exam  Physical Exam Vitals and nursing note reviewed.  Constitutional:      General: He is not in acute distress.    Appearance: He is well-developed.  HENT:     Head: Normocephalic and atraumatic.  Pulmonary:     Effort: Pulmonary effort is normal. No respiratory distress.  Musculoskeletal:        General: No swelling.  Skin:    Comments: R foot s/p amputation to the metatarsal - site has dehisent wound with possible purulence but without severe tenderness,  erythema, discharge. Wound dressing was soaked and had not been changed in while and was malodorous.   Neurological:     General: No focal deficit present.     Mental Status: He is alert.      Review of Systems  Constitutional:  Negative for fever.  Cardiovascular:  Negative for chest pain and palpitations.  Gastrointestinal:  Negative for constipation, diarrhea, nausea and vomiting.  Neurological:  Negative for dizziness, weakness and headaches.                Blood pressure 91/65, pulse 86, temperature 98.5 F (36.9 C), temperature source Oral, resp. rate 18, SpO2 100%. There is no height or weight on file to calculate BMI.   Blood pressure 91/65, pulse 86, temperature 98.5 F (36.9 C), temperature source Oral, resp. rate 18, SpO2 100%. There is no height or weight on file to calculate BMI.  Demographic Factors:  Male, Low socioeconomic status, and Unemployed  Loss Factors: Decline in physical health and Financial problems/change in socioeconomic status  Historical Factors: NA  Risk Reduction Factors:   Living with another person, especially a relative  Continued Clinical Symptoms:  Alcohol/Substance Abuse/Dependencies  Cognitive Features That Contribute To Risk:  None    Suicide Risk:  Minimal: No identifiable suicidal ideation.  Patients presenting with no risk factors but with morbid ruminations; may be classified as minimal risk  based on the severity of the depressive symptoms  Plan Of Care/Follow-up recommendations:  Plan to get patient into [facility].  Secure chat with Quirino Buckles started for recommending him to [facility].  Infectious disease office said that he can transferred to their facility for which we will get him a taxi, so that he can take out his PICC line and make other recommendations regarding his bilateral lower extremity wounds.    Disposition: infectious disease office    Verdell Given, MD PGY-1 Psychiatry Resident 05/12/2024, 2:47 PM

## 2024-05-13 ENCOUNTER — Telehealth: Payer: Self-pay | Admitting: Family

## 2024-05-13 DIAGNOSIS — Z794 Long term (current) use of insulin: Secondary | ICD-10-CM

## 2024-05-13 LAB — BASIC METABOLIC PANEL WITH GFR
BUN/Creatinine Ratio: 16 (calc) (ref 6–22)
BUN: 28 mg/dL — ABNORMAL HIGH (ref 7–25)
CO2: 27 mmol/L (ref 20–32)
Calcium: 9 mg/dL (ref 8.6–10.3)
Chloride: 103 mmol/L (ref 98–110)
Creat: 1.79 mg/dL — ABNORMAL HIGH (ref 0.70–1.30)
Glucose, Bld: 83 mg/dL (ref 65–99)
Potassium: 3.9 mmol/L (ref 3.5–5.3)
Sodium: 139 mmol/L (ref 135–146)
eGFR: 44 mL/min/{1.73_m2} — ABNORMAL LOW (ref >=60)

## 2024-05-13 LAB — CBC WITH DIFFERENTIAL/PLATELET
Absolute Lymphocytes: 3409 {cells}/uL (ref 850–3900)
Absolute Monocytes: 731 {cells}/uL (ref 200–950)
Basophils Absolute: 34 {cells}/uL (ref 0–200)
Basophils Relative: 0.4 %
Eosinophils Absolute: 204 {cells}/uL (ref 15–500)
Eosinophils Relative: 2.4 %
HCT: 30.5 % — ABNORMAL LOW (ref 38.5–50.0)
Hemoglobin: 9.3 g/dL — ABNORMAL LOW (ref 13.2–17.1)
MCH: 24.9 pg — ABNORMAL LOW (ref 27.0–33.0)
MCHC: 30.5 g/dL — ABNORMAL LOW (ref 32.0–36.0)
MCV: 81.8 fL (ref 80.0–100.0)
MPV: 9.8 fL (ref 7.5–12.5)
Monocytes Relative: 8.6 %
Neutro Abs: 4123 {cells}/uL (ref 1500–7800)
Neutrophils Relative %: 48.5 %
Platelets: 370 10*3/uL (ref 140–400)
RBC: 3.73 10*6/uL — ABNORMAL LOW (ref 4.20–5.80)
RDW: 13.7 % (ref 11.0–15.0)
Total Lymphocyte: 40.1 %
WBC: 8.5 10*3/uL (ref 3.8–10.8)

## 2024-05-13 LAB — C-REACTIVE PROTEIN: CRP: 77.3 mg/L — ABNORMAL HIGH (ref <8.0)

## 2024-05-13 LAB — SEDIMENTATION RATE: Sed Rate: 75 mm/h — ABNORMAL HIGH (ref 0–20)

## 2024-05-13 MED ORDER — DOXYCYCLINE HYCLATE 100 MG PO TABS: 100.0000 mg | ORAL_TABLET | Freq: Two times a day (BID) | ORAL | 0 refills | Status: DC

## 2024-05-13 MED ORDER — METFORMIN HCL ER 500 MG PO TB24: 1000.0000 mg | ORAL_TABLET | Freq: Two times a day (BID) | ORAL | 1 refills | Status: DC

## 2024-05-13 MED ORDER — CIPROFLOXACIN HCL 500 MG PO TABS: 500.0000 mg | ORAL_TABLET | Freq: Two times a day (BID) | ORAL | 0 refills | Status: DC

## 2024-05-13 NOTE — Addendum Note (Signed)
 Addended by: Tanji Storrs D on: 05/13/2024 03:56 PM   Modules accepted: Orders

## 2024-05-13 NOTE — Telephone Encounter (Signed)
 Patient called, states his metformin  was stopped while he was in the hospital due to an interaction with his IV antibiotics. He would like to know if he should resume the metformin . If so, he is requesting that Marlan Silva, NP send it in.   Marsha Hillman, BSN, RN

## 2024-05-13 NOTE — Telephone Encounter (Signed)
 Medication sent to pharmacy

## 2024-05-13 NOTE — Telephone Encounter (Signed)
 Spoke with [name] regarding his results and elevated inflammatory markers. Seen by Dr. Althea Atkinson with no purulence and only serous fluid and likely to need additional debridement. Will treat with oral doxycycline  100 mg po bid and ciprofloxacin  500 mg po bid for 2 weeks. Informed this will not cover ESBL Klebsiella which ideally will treated with course of meropenem . Plan for follow up in 3 weeks or sooner if needed and continue wound care per Dr. Althea Atkinson.

## 2024-05-14 NOTE — Telephone Encounter (Signed)
 Spoke to [name] with [facility] and advised him IV abx have been stopped and picc line removed.  [name] verbalized understanding

## 2024-05-28 ENCOUNTER — Other Ambulatory Visit: Payer: Self-pay | Admitting: Podiatry

## 2024-05-28 ENCOUNTER — Inpatient Hospital Stay
Admission: RE | Admit: 2024-05-28 | Discharge: 2024-05-28 | Disposition: A | Discharge status: Home / Self Care | Source: Ambulatory Visit

## 2024-05-28 HISTORY — DX: Bacteremia: R78.81

## 2024-05-28 HISTORY — DX: Obesity, unspecified: E66.9

## 2024-05-28 HISTORY — DX: Chronic kidney disease, stage 3a: N18.31

## 2024-05-28 HISTORY — DX: Cocaine abuse, uncomplicated: F14.10

## 2024-05-28 HISTORY — DX: Cutaneous abscess of unspecified foot: L02.619

## 2024-05-28 HISTORY — DX: Hyperlipidemia, unspecified: E78.5

## 2024-05-28 HISTORY — DX: Methicillin resistant Staphylococcus aureus infection as the cause of diseases classified elsewhere: B95.62

## 2024-05-28 HISTORY — DX: Complete traumatic amputation of right great toe, initial encounter: S98.111A

## 2024-05-28 HISTORY — DX: Anemia, unspecified: D64.9

## 2024-05-28 HISTORY — DX: Type 2 diabetes mellitus without complications: E11.9

## 2024-05-28 HISTORY — DX: Tobacco use: Z72.0

## 2024-05-28 HISTORY — DX: Essential (primary) hypertension: I10

## 2024-05-28 HISTORY — DX: Osteomyelitis, unspecified: M86.9

## 2024-05-29 ENCOUNTER — Encounter
Admission: RE | Admit: 2024-05-29 | Discharge: 2024-05-29 | Disposition: A | Discharge status: Home / Self Care | Source: Ambulatory Visit | Attending: Podiatry | Admitting: Podiatry

## 2024-05-29 ENCOUNTER — Other Ambulatory Visit: Payer: Self-pay

## 2024-05-29 DIAGNOSIS — Z01818 Encounter for other preprocedural examination: Secondary | ICD-10-CM

## 2024-05-29 NOTE — Progress Notes (Addendum)
 Pt lives in [place] and does not need to come in for labs orior to surgery. Did not want to make pt drive to [place] to pick up gatorade and soap. Pt instructed to use dial soap the night before surgery and again the morning of surgery. Pt also instructed to go get Gatorade G2 and drink the morning of surgery having it finished 2 hours prior to arrival time to hospital. Pt verbalized understanding

## 2024-05-29 NOTE — Patient Instructions (Addendum)
 Your procedure is scheduled on:06-05-24 Thursday Report to the Registration Desk on the 1st floor of the [facility].Then proceed to the 2nd floor Surgery Desk To find out your arrival time, please call [phone] between 1PM - 3PM on:06-04-24 Wednesday If your arrival time is 6:00 am, do not arrive before that time as the [facility] entrance doors do not open until 6:00 am.  REMEMBER: Instructions that are not followed completely may result in serious medical risk, up to and including death; or upon the discretion of your surgeon and anesthesiologist your surgery may need to be rescheduled.  Do not eat food after midnight the night before surgery.  No gum chewing or hard candies.  You may however, drink Water up to 2 hours before you are scheduled to arrive for your surgery. Do not drink anything within 2 hours of your scheduled arrival time.  In addition, your doctor has ordered for you to drink the provided:  Gatorade G2 Drinking this carbohydrate drink up to two hours before surgery helps to reduce insulin  resistance and improve patient outcomes. Please complete drinking 2 hours before scheduled arrival time.  One week prior to surgery:Stop NOW (05-29-24) Stop Anti-inflammatories (NSAIDS) such as Advil , Aleve , Ibuprofen , Motrin , Naproxen , Naprosyn  and Aspirin  based products such as Excedrin, Goody's Powder, BC Powder. Stop ANY OVER THE COUNTER supplements until after surgery (Vitamin C , Vitamin B12)-Continue your iron  polysaccharides (NIFEREX) up until the day prior to surgery  You may however, continue to take Tylenol  if needed for pain up until the day of surgery.  Stop metFORMIN  (GLUCOPHAGE -XR) 2 days prior to surgery-Last dose will be on 06-02-24 Monday  Continue taking all of your other prescription medications up until the day of surgery.  Do NOT take any medications the day of surgery  Do NOT take any Insulin  the morning of surgery  No Alcohol for 24 hours before or after  surgery.  No Smoking including e-cigarettes for 24 hours before surgery.  No chewable tobacco products for at least 6 hours before surgery.  No nicotine  patches on the day of surgery.  Do not use any recreational drugs for at least a week (preferably 2 weeks) before your surgery.  Please be advised that the combination of cocaine and anesthesia may have negative outcomes, up to and including death. If you test positive for cocaine, your surgery will be cancelled.  On the morning of surgery brush your teeth with toothpaste and water, you may rinse your mouth with mouthwash if you wish. Do not swallow any toothpaste or mouthwash.  Use Dial Soap the night before surgery and the morning of surgery  Do not wear jewelry, make-up, hairpins, clips or nail polish.  For welded (permanent) jewelry: bracelets, anklets, waist bands, etc.  Please have this removed prior to surgery.  If it is not removed, there is a chance that hospital personnel will need to cut it off on the day of surgery.  Do not wear lotions, powders, or perfumes.   Do not shave body hair from the neck down 48 hours before surgery.  Contact lenses, hearing aids and dentures may not be worn into surgery.  Do not bring valuables to the hospital. [facility] is not responsible for any missing/lost belongings or valuables.   Notify your doctor if there is any change in your medical condition (cold, fever, infection).  Wear comfortable clothing (specific to your surgery type) to the hospital.  After surgery, you can help prevent lung complications by doing breathing  exercises.  Take deep breaths and cough every 1-2 hours. Your doctor may order a device called an Incentive Spirometer to help you take deep breaths. When coughing or sneezing, hold a pillow firmly against your incision with both hands. This is called "splinting." Doing this helps protect your incision. It also decreases belly discomfort.  If you are being admitted  to the hospital overnight, leave your suitcase in the car. After surgery it may be brought to your room.  In case of increased patient census, it may be necessary for you, the patient, to continue your postoperative care in the Same Day Surgery department.  If you are being discharged the day of surgery, you will not be allowed to drive home. You will need a responsible individual to drive you home and stay with you for 24 hours after surgery.   If you are taking public transportation, you will need to have a responsible individual with you.  Please call the Pre-admissions Testing Dept. at [phone] if you have any questions about these instructions.  Surgery Visitation Policy:  Patients having surgery or a procedure may have two visitors.  Children under the age of 89 must have an adult with them who is not the patient.   How to Use an Incentive Spirometer An incentive spirometer is a tool that measures how well you are filling your lungs with each breath. Learning to take long, deep breaths using this tool can help you keep your lungs clear and active. This may help to reverse or lessen your chance of developing breathing (pulmonary) problems, especially infection. You may be asked to use a spirometer: After a surgery. If you have a lung problem or a history of smoking. After a long period of time when you have been unable to move or be active. If the spirometer includes an indicator to show the highest number that you have reached, your health care provider or respiratory therapist will help you set a goal. Keep a log of your progress as told by your health care provider. What are the risks? Breathing too quickly may cause dizziness or cause you to pass out. Take your time so you do not get dizzy or light-headed. If you are in pain, you may need to take pain medicine before doing incentive spirometry. It is harder to take a deep breath if you are having pain. How to use your  incentive spirometer  Sit up on the edge of your bed or on a chair. Hold the incentive spirometer so that it is in an upright position. Before you use the spirometer, breathe out normally. Place the mouthpiece in your mouth. Make sure your lips are closed tightly around it. Breathe in slowly and as deeply as you can through your mouth, causing the piston or the ball to rise toward the top of the chamber. Hold your breath for 3-5 seconds, or for as long as possible. If the spirometer includes a coach indicator, use this to guide you in breathing. Slow down your breathing if the indicator goes above the marked areas. Remove the mouthpiece from your mouth and breathe out normally. The piston or ball will return to the bottom of the chamber. Rest for a few seconds, then repeat the steps 10 or more times. Take your time and take a few normal breaths between deep breaths so that you do not get dizzy or light-headed. Do this every 1-2 hours when you are awake. If the spirometer includes a goal marker to  show the highest number you have reached (best effort), use this as a goal to work toward during each repetition. After each set of 10 deep breaths, cough a few times. This will help to make sure that your lungs are clear. If you have an incision on your chest or abdomen from surgery, place a pillow or a rolled-up towel firmly against the incision when you cough. This can help to reduce pain while taking deep breaths and coughing. General tips When you are able to get out of bed: Walk around often. Continue to take deep breaths and cough in order to clear your lungs. Keep using the incentive spirometer until your health care provider says it is okay to stop using it. If you have been in the hospital, you may be told to keep using the spirometer at home. Contact a health care provider if: You are having difficulty using the spirometer. You have trouble using the spirometer as often as instructed. Your  pain medicine is not giving enough relief for you to use the spirometer as told. You have a fever. Get help right away if: You develop shortness of breath. You develop a cough with bloody mucus from the lungs. You have fluid or blood coming from an incision site after you cough. Summary An incentive spirometer is a tool that can help you learn to take long, deep breaths to keep your lungs clear and active. You may be asked to use a spirometer after a surgery, if you have a lung problem or a history of smoking, or if you have been inactive for a long period of time. Use your incentive spirometer as instructed every 1-2 hours while you are awake. If you have an incision on your chest or abdomen, place a pillow or a rolled-up towel firmly against your incision when you cough. This will help to reduce pain. Get help right away if you have shortness of breath, you cough up bloody mucus, or blood comes from your incision when you cough. This information is not intended to replace advice given to you by your health care provider. Make sure you discuss any questions you have with your health care provider. Document Revised: 09/28/2023 Document Reviewed: 09/28/2023 Elsevier Patient Education  2024 [employer].   [employer] Directory to address health-related social needs:  [email or web address]

## 2024-06-03 NOTE — Progress Notes (Deleted)
 Subjective:   Patient ID: [name], male    DOB: 1966/04/29, 58 y.o.   MRN: [id number]  No chief complaint on file.   HPI:  [name] is a 57 y.o. male with polymicrobial diabetic foot infection with Pseudomonas, ESBL Klebsiella and MRSA  and complicated by MRSA bacteremia last seen on 6//9/25. PICC line was noted to have been partially removed at the time of the visit and was removed. Placed on ciprofloxacin  and doxycyline with no oral options for the ESBL Klebsiella with continued wound care per Podiatry and seen on 05/13/24. Large non-healing ulcer that would likely require additional surgical debridement. Right foot noted to have dehiscence and surgical intervention planned for 06/05/24 with debridement and delayed closure of the right foot. Here today for follow up.    No Known Allergies    Outpatient Medications Prior to Visit  Medication Sig Dispense Refill   Accu-Chek Softclix Lancets lancets Use to check blood sugar 3 times daily. 100 each 6   ascorbic acid  (VITAMIN C ) 500 MG tablet Take 1 tablet (500 mg total) by mouth daily. 30 tablet 2   bisacodyl  (DULCOLAX) 5 MG EC tablet Take 2 tablets (10 mg total) by mouth at bedtime as needed for moderate constipation. 30 tablet 0   Blood Glucose Monitoring Suppl (ACCU-CHEK GUIDE) w/Device KIT Use to check blood sugar 3 times daily. 1 kit 0   Continuous Blood Gluc Transmit (DEXCOM G6 TRANSMITTER) MISC Use to check blood sugar three times daily. Change transmitter once every 909 days. E11.69 1 each 1   cyanocobalamin  1000 MCG tablet Take 1 tablet (1,000 mcg total) by mouth daily. 90 tablet 0   doxycycline  (VIBRA -TABS) 100 MG tablet Take 1 tablet (100 mg total) by mouth 2 (two) times daily. 28 tablet 0   gabapentin  (NEURONTIN ) 300 MG capsule Take 1 capsule (300 mg total) by mouth at bedtime. 30 capsule 3   glucose blood (ACCU-CHEK GUIDE TEST) test strip Use to check blood sugar 3 times daily. 100 each 6   insulin  aspart (NOVOLOG ) 100  UNIT/ML FlexPen Inject 0-15 Units into the skin 3 (three) times daily before meals.  Correction coverage: Moderate (average weight, post-op) CBG 70 - 120: 0 units CBG 121 - 150: 2 units CBG 151 - 200: 3 units CBG 201 - 250: 5 units CBG 251 - 300: 8 units CBG 301 - 350: 11 units CBG 351 - 400: 15 units CBG > 400: call MD 15 mL 11   Insulin  NPH, Human,, Isophane, (HUMULIN  N KWIKPEN) 100 UNIT/ML Kiwkpen Inject 22 Units into the skin 2 (two) times daily. 30 mL 5   Insulin  Pen Needle 32G X 4 MM MISC Use as directed to inject insulin  up to 4 times daily. 100 each 0   iron  polysaccharides (NIFEREX) 150 MG capsule Take 1 capsule (150 mg total) by mouth daily. 90 capsule 0   losartan  (COZAAR ) 25 MG tablet Take 1 tablet (25 mg total) by mouth daily. (Patient taking differently: Take 25 mg by mouth every morning.) 30 tablet 11   metFORMIN  (GLUCOPHAGE -XR) 500 MG 24 hr tablet Take 2 tablets (1,000 mg total) by mouth 2 (two) times daily with a meal. 120 tablet 1   methocarbamol  (ROBAXIN ) 750 MG tablet Take 1 tablet (750 mg total) by mouth every 6 (six) hours as needed for muscle spasms. 45 tablet 0   nitroGLYCERIN  (NITRODUR - DOSED IN MG/24 HR) 0.2 mg/hr patch Place 1 patch (0.2 mg total) onto the skin daily. 30  patch 12   polyethylene glycol powder (GLYCOLAX /MIRALAX ) 17 GM/SCOOP powder Take 17 g by mouth 2 (two) times daily. Mix as directed. (Patient taking differently: Take 17 g by mouth 2 (two) times daily as needed for mild constipation. Mix as directed.) 238 g 0   Vitamin D , Ergocalciferol , (DRISDOL ) 1.25 MG (50000 UNIT) CAPS capsule Take 1 capsule (50,000 Units total) by mouth every 7 (seven) days. (Patient taking differently: Take 50,000 Units by mouth every Tuesday.) 12 capsule 0   No facility-administered medications prior to visit.     Past Medical History:  Diagnosis Date   Amputation of right great toe (HCC)    Cellulitis and abscess of foot    CKD stage 3a, GFR 45-59 ml/min (HCC)     Cocaine abuse (HCC)    + UDS on 05-12-24   DM (diabetes mellitus), type 2 (HCC)    ESBL (extended spectrum beta-lactamase) producing bacteria infection 04/18/2024   Hyperlipidemia    Hypertension    MRSA bacteremia    Normocytic anemia    Obesity    Tobacco abuse    Toe osteomyelitis, left (HCC)    Tuberculosis    tested positive,treated with medications.     Past Surgical History:  Procedure Laterality Date   AMPUTATION TOE Right 03/26/2024   Procedure: AMPUTATION, TOE;  Surgeon: Harden Jerona GAILS, MD;  Location: [facility] OR;  Service: Orthopedics;  Laterality: Right;  RIGHT GREAT TOE AMPUTATION   BONE BIOPSY  04/18/2024   Procedure: BIOPSY, BONE (2ND METATARSAL);  Surgeon: Ashley Soulier, DPM;  Location: [facility] ORS;  Service: Orthopedics/Podiatry;;   FOOT SURGERY Left    I & D EXTREMITY Left 05/05/2022   Procedure: LEFT FOOT DEBRIDEMENT;  Surgeon: Harden Jerona GAILS, MD;  Location: [facility] OR;  Service: Orthopedics;  Laterality: Left;   IRRIGATION AND DEBRIDEMENT FOOT Right 04/18/2024   Procedure: IRRIGATION AND DEBRIDEMENT FOOT, PLACEMENT OF ANTIBIOTIC BEADS;  Surgeon: Ashley Soulier, DPM;  Location: [facility] ORS;  Service: Orthopedics/Podiatry;  Laterality: Right;   MYRINGOTOMY WITH TUBE PLACEMENT Bilateral 07/20/2022   Procedure: MYRINGOTOMY WITH TUBE PLACEMENT;  Surgeon: Juengel, Paul, MD;  Location: [facility];  Service: ENT;  Laterality: Bilateral;  Diabetic   TONSILLECTOMY     TRANSMETATARSAL AMPUTATION Right 04/23/2024   Procedure: AMPUTATION, FOOT, TRANSMETATARSAL;  Surgeon: Ashley Soulier, DPM;  Location: [facility] ORS;  Service: Orthopedics/Podiatry;  Laterality: Right;       Review of Systems  Objective:   There were no vitals taken for this visit. Nursing note and vital signs reviewed.  Physical Exam      08/28/2023   11:44 AM 09/11/2022    3:03 PM 08/01/2022    9:16 AM 07/06/2022    2:26 PM 06/05/2022    9:49 AM  Depression screen PHQ 2/9  Decreased Interest 0 0  0 3  Down,  Depressed, Hopeless 0 0 0 0 0  PHQ - 2 Score 0 0 0 0 3  Altered sleeping  0  2 3  Tired, decreased energy  0   2  Change in appetite  0  0 0  Feeling bad or failure about yourself   0  0 0  Trouble concentrating  0  0 0  Moving slowly or fidgety/restless  0  0 0  Suicidal thoughts  0  0 0  PHQ-9 Score  0  2 8     Assessment & Plan:    Patient Active Problem List   Diagnosis Date Noted   MRSA bacteremia  04/21/2024   HLD (hyperlipidemia) 04/17/2024   Chronic kidney disease, stage 3a (HCC) 04/17/2024   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   Normocytic anemia 04/17/2024   Amputation of right great toe (HCC) 03/31/2024   Necrotizing soft tissue infection 03/26/2024   Hypertension associated with diabetes (HCC) 12/12/2022   Toe osteomyelitis, left (HCC) 12/12/2022   Pyogenic inflammation of bone (HCC) 06/02/2022   PICC (peripherally inserted central catheter) in place 06/02/2022   Medication management 06/02/2022   Diabetic foot infection (HCC) 06/02/2022   Smoking 06/02/2022   Tobacco abuse 05/06/2022   Hyperkalemia 05/06/2022   Type 2 diabetes mellitus (HCC) 05/05/2022   Obesity (BMI 30-39.9) 05/05/2022   Cellulitis and abscess of foot    AKI (acute kidney injury) (HCC) 05/04/2022   Injury of left foot    Diabetic foot ulcer (HCC) 12/14/2021     Problem List Items Addressed This Visit   None    I am having Jama Lunger maintain his Dexcom G6 Transmitter, gabapentin , Insulin  Pen Needle, methocarbamol , nitroGLYCERIN , Accu-Chek Guide Test, Accu-Chek Guide, Accu-Chek Softclix Lancets, losartan , HumuLIN  N KwikPen, bisacodyl , polyethylene glycol powder, cyanocobalamin , iron  polysaccharides, ascorbic acid , Vitamin D  (Ergocalciferol ), insulin  aspart, doxycycline , and metFORMIN .   No orders of the defined types were placed in this encounter.    Follow-up: No follow-ups on file. or sooner if needed.   Cathlyn July, MSN, FNP-C Nurse  Practitioner [facility] Main number: [phone]

## 2024-06-04 ENCOUNTER — Ambulatory Visit: Admitting: Family

## 2024-06-04 MED ORDER — CHLORHEXIDINE GLUCONATE 0.12 % MT SOLN
15.0000 mL | Freq: Once | OROMUCOSAL | Status: AC
Start: 2024-06-04 — End: 2024-06-05
  Administered 2024-06-05: 15 mL via OROMUCOSAL

## 2024-06-04 MED ORDER — SODIUM CHLORIDE 0.9 % IV SOLN: INTRAVENOUS | Status: DC

## 2024-06-04 MED ORDER — VANCOMYCIN HCL IN DEXTROSE 1-5 GM/200ML-% IV SOLN
1000.0000 mg | INTRAVENOUS | Status: AC
  Administered 2024-06-05: 1000 mg via INTRAVENOUS

## 2024-06-04 MED ORDER — ORAL CARE MOUTH RINSE: 15.0000 mL | Freq: Once | OROMUCOSAL | Status: AC

## 2024-06-04 MED ORDER — CEFAZOLIN SODIUM-DEXTROSE 2-4 GM/100ML-% IV SOLN
2.0000 g | INTRAVENOUS | Status: AC
  Administered 2024-06-05: 2 g via INTRAVENOUS

## 2024-06-05 ENCOUNTER — Other Ambulatory Visit: Payer: Self-pay

## 2024-06-05 ENCOUNTER — Ambulatory Visit: Payer: Self-pay | Admitting: Anesthesiology

## 2024-06-05 ENCOUNTER — Encounter: Payer: Self-pay | Admitting: Podiatry

## 2024-06-05 ENCOUNTER — Ambulatory Visit
Admission: RE | Admit: 2024-06-05 | Discharge: 2024-06-05 | Disposition: A | Discharge status: Home / Self Care | Attending: Podiatry | Admitting: Podiatry

## 2024-06-05 ENCOUNTER — Encounter
Admission: RE | Disposition: A | Discharge status: Home / Self Care | Payer: Self-pay | Source: Home / Self Care | Attending: Podiatry

## 2024-06-05 ENCOUNTER — Ambulatory Visit

## 2024-06-05 DIAGNOSIS — E1122 Type 2 diabetes mellitus with diabetic chronic kidney disease: Secondary | ICD-10-CM | POA: Insufficient documentation

## 2024-06-05 DIAGNOSIS — I10 Essential (primary) hypertension: Secondary | ICD-10-CM | POA: Diagnosis not present

## 2024-06-05 DIAGNOSIS — I129 Hypertensive chronic kidney disease with stage 1 through stage 4 chronic kidney disease, or unspecified chronic kidney disease: Secondary | ICD-10-CM | POA: Insufficient documentation

## 2024-06-05 DIAGNOSIS — N1831 Chronic kidney disease, stage 3a: Secondary | ICD-10-CM | POA: Insufficient documentation

## 2024-06-05 DIAGNOSIS — T8781 Dehiscence of amputation stump: Secondary | ICD-10-CM | POA: Diagnosis present

## 2024-06-05 DIAGNOSIS — Y835 Amputation of limb(s) as the cause of abnormal reaction of the patient, or of later complication, without mention of misadventure at the time of the procedure: Secondary | ICD-10-CM | POA: Insufficient documentation

## 2024-06-05 DIAGNOSIS — E1142 Type 2 diabetes mellitus with diabetic polyneuropathy: Secondary | ICD-10-CM | POA: Insufficient documentation

## 2024-06-05 DIAGNOSIS — Z01818 Encounter for other preprocedural examination: Secondary | ICD-10-CM

## 2024-06-05 HISTORY — PX: INCISION AND DRAINAGE OF WOUND: SHX1803

## 2024-06-05 LAB — GLUCOSE, CAPILLARY
Glucose-Capillary: 154 mg/dL — ABNORMAL HIGH (ref 70–99)
Glucose-Capillary: 176 mg/dL — ABNORMAL HIGH (ref 70–99)

## 2024-06-05 SURGERY — IRRIGATION AND DEBRIDEMENT WOUND
Anesthesia: General | Site: Foot | Laterality: Right

## 2024-06-05 MED ORDER — PHENYLEPHRINE 80 MCG/ML (10ML) SYRINGE FOR IV PUSH (FOR BLOOD PRESSURE SUPPORT)
PREFILLED_SYRINGE | INTRAVENOUS | Status: DC | PRN
  Administered 2024-06-05: 160 ug via INTRAVENOUS
  Administered 2024-06-05 (×2): 80 ug via INTRAVENOUS
  Administered 2024-06-05: 160 ug via INTRAVENOUS
  Administered 2024-06-05: 120 ug via INTRAVENOUS
  Administered 2024-06-05: 160 ug via INTRAVENOUS
  Administered 2024-06-05: 40 ug via INTRAVENOUS
  Administered 2024-06-05: 120 ug via INTRAVENOUS
  Administered 2024-06-05 (×3): 80 ug via INTRAVENOUS
  Administered 2024-06-05: 120 ug via INTRAVENOUS

## 2024-06-05 MED ORDER — LIDOCAINE HCL (PF) 2 % IJ SOLN
INTRAMUSCULAR | Status: AC
  Filled 2024-06-05: qty 5

## 2024-06-05 MED ORDER — MIDAZOLAM HCL 2 MG/2ML IJ SOLN
INTRAMUSCULAR | Status: AC
  Filled 2024-06-05: qty 2

## 2024-06-05 MED ORDER — LIDOCAINE HCL (PF) 1 % IJ SOLN
INTRAMUSCULAR | Status: AC
Start: 2024-06-05 — End: 2024-06-05
  Filled 2024-06-05: qty 30

## 2024-06-05 MED ORDER — 0.9 % SODIUM CHLORIDE (POUR BTL) OPTIME
TOPICAL | Status: DC | PRN
  Administered 2024-06-05: 500 mL

## 2024-06-05 MED ORDER — FENTANYL CITRATE (PF) 100 MCG/2ML IJ SOLN
INTRAMUSCULAR | Status: AC
Start: 2024-06-05 — End: 2024-06-05
  Filled 2024-06-05: qty 2

## 2024-06-05 MED ORDER — BUPIVACAINE HCL (PF) 0.25 % IJ SOLN
INTRAMUSCULAR | Status: AC
Start: 2024-06-05 — End: 2024-06-05
  Filled 2024-06-05: qty 30

## 2024-06-05 MED ORDER — PHENYLEPHRINE 80 MCG/ML (10ML) SYRINGE FOR IV PUSH (FOR BLOOD PRESSURE SUPPORT)
PREFILLED_SYRINGE | INTRAVENOUS | Status: AC
  Filled 2024-06-05: qty 10

## 2024-06-05 MED ORDER — HEMOSTATIC AGENTS (NO CHARGE) OPTIME
TOPICAL | Status: DC | PRN
  Administered 2024-06-05: 1 via TOPICAL

## 2024-06-05 MED ORDER — FENTANYL CITRATE (PF) 100 MCG/2ML IJ SOLN: 25.0000 ug | INTRAMUSCULAR | Status: DC | PRN

## 2024-06-05 MED ORDER — FENTANYL CITRATE (PF) 100 MCG/2ML IJ SOLN
INTRAMUSCULAR | Status: DC | PRN
  Administered 2024-06-05 (×2): 25 ug via INTRAVENOUS

## 2024-06-05 MED ORDER — METHYLENE BLUE (ANTIDOTE) 1 % IV SOLN
INTRAVENOUS | Status: AC
  Filled 2024-06-05: qty 10

## 2024-06-05 MED ORDER — MIDAZOLAM HCL 2 MG/2ML IJ SOLN
INTRAMUSCULAR | Status: DC | PRN
  Administered 2024-06-05: 1 mg via INTRAVENOUS

## 2024-06-05 MED ORDER — PHENYLEPHRINE 80 MCG/ML (10ML) SYRINGE FOR IV PUSH (FOR BLOOD PRESSURE SUPPORT)
PREFILLED_SYRINGE | INTRAVENOUS | Status: AC
Start: 2024-06-05 — End: 2024-06-05
  Filled 2024-06-05: qty 10

## 2024-06-05 MED ORDER — LIDOCAINE-EPINEPHRINE 1 %-1:100000 IJ SOLN
INTRAMUSCULAR | Status: DC | PRN
  Administered 2024-06-05: 10 mL via INTRAMUSCULAR

## 2024-06-05 MED ORDER — LIDOCAINE-EPINEPHRINE 1 %-1:100000 IJ SOLN
INTRAMUSCULAR | Status: AC
  Filled 2024-06-05: qty 1

## 2024-06-05 MED ORDER — SODIUM CHLORIDE 0.9 % IR SOLN
Status: DC | PRN
  Administered 2024-06-05: 1000 mL

## 2024-06-05 MED ORDER — OXYCODONE HCL 5 MG/5ML PO SOLN: 5.0000 mg | Freq: Once | ORAL | Status: DC | PRN

## 2024-06-05 MED ORDER — ONDANSETRON HCL 4 MG/2ML IJ SOLN
INTRAMUSCULAR | Status: DC | PRN
  Administered 2024-06-05: 4 mg via INTRAVENOUS

## 2024-06-05 MED ORDER — BUPIVACAINE HCL (PF) 0.5 % IJ SOLN
INTRAMUSCULAR | Status: AC
  Filled 2024-06-05: qty 30

## 2024-06-05 MED ORDER — VANCOMYCIN HCL IN DEXTROSE 1-5 GM/200ML-% IV SOLN
INTRAVENOUS | Status: AC
  Filled 2024-06-05: qty 200

## 2024-06-05 MED ORDER — CHLORHEXIDINE GLUCONATE 0.12 % MT SOLN
OROMUCOSAL | Status: AC
Start: 2024-06-05 — End: 2024-06-05
  Filled 2024-06-05: qty 15

## 2024-06-05 MED ORDER — PROPOFOL 1000 MG/100ML IV EMUL
INTRAVENOUS | Status: AC
  Filled 2024-06-05: qty 100

## 2024-06-05 MED ORDER — CHLORHEXIDINE GLUCONATE 4 % EX SOLN
1.0000 | CUTANEOUS | 1 refills | Status: DC
Start: 2024-06-05 — End: 2024-10-23
  Filled 2024-06-05: qty 946, fill #0

## 2024-06-05 MED ORDER — CEFAZOLIN SODIUM-DEXTROSE 2-4 GM/100ML-% IV SOLN
INTRAVENOUS | Status: AC
Start: 2024-06-05 — End: 2024-06-05
  Filled 2024-06-05: qty 100

## 2024-06-05 MED ORDER — PROPOFOL 10 MG/ML IV BOLUS
INTRAVENOUS | Status: DC | PRN
  Administered 2024-06-05: 20 mg via INTRAVENOUS
  Administered 2024-06-05 (×2): 30 mg via INTRAVENOUS
  Administered 2024-06-05: 100 mg via INTRAVENOUS

## 2024-06-05 MED ORDER — OXYCODONE-ACETAMINOPHEN 5-325 MG PO TABS
1.0000 | ORAL_TABLET | Freq: Four times a day (QID) | ORAL | 0 refills | Status: DC | PRN
  Filled 2024-06-05: qty 30, 5d supply, fill #0

## 2024-06-05 MED ORDER — MUPIROCIN 2 % EX OINT
1.0000 | TOPICAL_OINTMENT | Freq: Two times a day (BID) | CUTANEOUS | 0 refills | Status: DC
  Filled 2024-06-05: qty 44, 22d supply, fill #0

## 2024-06-05 MED ORDER — PROPOFOL 500 MG/50ML IV EMUL
INTRAVENOUS | Status: DC | PRN
  Administered 2024-06-05: 60 ug/kg/min via INTRAVENOUS

## 2024-06-05 MED ORDER — OXYCODONE HCL 5 MG PO TABS: 5.0000 mg | ORAL_TABLET | Freq: Once | ORAL | Status: DC | PRN

## 2024-06-05 SURGICAL SUPPLY — 59 items
BLADE OSC/SAGITTAL MD 5.5X18 (BLADE) IMPLANT
BLADE SURG 15 STRL LF DISP TIS (BLADE) ×1 IMPLANT
BLADE SW THK.38XMED LNG THN (BLADE) IMPLANT
BNDG COHESIVE 4X5 TAN STRL LF (GAUZE/BANDAGES/DRESSINGS) ×1 IMPLANT
BNDG COHESIVE 6X5 TAN ST LF (GAUZE/BANDAGES/DRESSINGS) ×1 IMPLANT
BNDG ELASTIC 4INX 5YD STR LF (GAUZE/BANDAGES/DRESSINGS) ×1 IMPLANT
BNDG ESMARCH 4X12 STRL LF (GAUZE/BANDAGES/DRESSINGS) ×1 IMPLANT
BNDG GAUZE DERMACEA FLUFF 4 (GAUZE/BANDAGES/DRESSINGS) ×1 IMPLANT
BNDG STRETCH GAUZE 3IN X12FT (GAUZE/BANDAGES/DRESSINGS) ×1 IMPLANT
CANISTER WOUND CARE 500ML ATS (WOUND CARE) ×1 IMPLANT
CUFF TOURN SGL QUICK 12 (TOURNIQUET CUFF) IMPLANT
CUFF TOURN SGL QUICK 18X4 (TOURNIQUET CUFF) IMPLANT
DRAPE FLUOR MINI C-ARM 54X84 (DRAPES) IMPLANT
DRAPE XRAY CASSETTE 23X24 (DRAPES) IMPLANT
DRSG MEPILEX FLEX 3X3 (GAUZE/BANDAGES/DRESSINGS) IMPLANT
DURAPREP 26ML APPLICATOR (WOUND CARE) ×1 IMPLANT
ELECTRODE REM PT RTRN 9FT ADLT (ELECTROSURGICAL) ×1 IMPLANT
GAUZE 4X4 16PLY ~~LOC~~+RFID DBL (SPONGE) IMPLANT
GAUZE PACKING 0.25INX5YD STRL (GAUZE/BANDAGES/DRESSINGS) ×1 IMPLANT
GAUZE SPONGE 4X4 12PLY STRL (GAUZE/BANDAGES/DRESSINGS) ×1 IMPLANT
GAUZE STRETCH 2X75IN STRL (MISCELLANEOUS) ×1 IMPLANT
GAUZE XEROFORM 1X8 LF (GAUZE/BANDAGES/DRESSINGS) ×1 IMPLANT
GLOVE BIO SURGEON STRL SZ7.5 (GLOVE) ×1 IMPLANT
GLOVE BIOGEL PI IND STRL 8 (GLOVE) ×1 IMPLANT
GOWN STRL REUS W/ TWL LRG LVL3 (GOWN DISPOSABLE) ×1 IMPLANT
GOWN STRL REUS W/TWL MED LVL3 (GOWN DISPOSABLE) ×1 IMPLANT
GOWN STRL REUS W/TWL XL LVL4 (GOWN DISPOSABLE) ×1 IMPLANT
HANDPIECE VERSAJET DEBRIDEMENT (MISCELLANEOUS) IMPLANT
IV NS 1000ML BAXH (IV SOLUTION) ×1 IMPLANT
KIT DRSG VAC SLVR GRANUFM (MISCELLANEOUS) ×1 IMPLANT
KIT TURNOVER KIT A (KITS) ×1 IMPLANT
LABEL OR SOLS (LABEL) ×1 IMPLANT
MANIFOLD NEPTUNE II (INSTRUMENTS) ×1 IMPLANT
NDL FILTER BLUNT 18X1 1/2 (NEEDLE) ×1 IMPLANT
NDL HYPO 25X1 1.5 SAFETY (NEEDLE) ×1 IMPLANT
NDL SAFETY ECLIPSE 18X1.5 (NEEDLE) IMPLANT
NEEDLE FILTER BLUNT 18X1 1/2 (NEEDLE) ×1 IMPLANT
NEEDLE HYPO 25X1 1.5 SAFETY (NEEDLE) ×2 IMPLANT
NS IRRIG 500ML POUR BTL (IV SOLUTION) ×1 IMPLANT
PACK EXTREMITY ARMC (MISCELLANEOUS) ×1 IMPLANT
PACKING GAUZE IODOFORM 1INX5YD (GAUZE/BANDAGES/DRESSINGS) ×1 IMPLANT
PAD ABD DERMACEA PRESS 5X9 (GAUZE/BANDAGES/DRESSINGS) ×1 IMPLANT
PENCIL SMOKE EVACUATOR (MISCELLANEOUS) ×1 IMPLANT
POWDER SURGICEL 3.0 GRAM (HEMOSTASIS) IMPLANT
RASP SM TEAR CROSS CUT (RASP) IMPLANT
SHIELD FULL FACE ANTIFOG 7M (MISCELLANEOUS) ×1 IMPLANT
SOL .9 NS 3000ML IRR UROMATIC (IV SOLUTION) ×1 IMPLANT
STAPLER SKIN PROX 35W (STAPLE) IMPLANT
STOCKINETTE IMPERVIOUS 9X36 MD (GAUZE/BANDAGES/DRESSINGS) ×1 IMPLANT
SUT ETHILON 2 0 FS 18 (SUTURE) IMPLANT
SUT VIC AB 2-0 SH 27XBRD (SUTURE) IMPLANT
SUT VIC AB 3-0 SH 27X BRD (SUTURE) ×1 IMPLANT
SUTURE EHLN 3-0 FS-10 30 BLK (SUTURE) IMPLANT
SWAB CULTURE AMIES ANAERIB BLU (MISCELLANEOUS) IMPLANT
SYR 10ML LL (SYRINGE) ×1 IMPLANT
SYR 3ML LL SCALE MARK (SYRINGE) ×1 IMPLANT
TIP FAN IRRIG PULSAVAC PLUS (DISPOSABLE) ×1 IMPLANT
TRAP FLUID SMOKE EVACUATOR (MISCELLANEOUS) ×1 IMPLANT
WATER STERILE IRR 500ML POUR (IV SOLUTION) ×1 IMPLANT

## 2024-06-05 NOTE — Transfer of Care (Signed)
 Immediate Anesthesia Transfer of Care Note  Patient: Remmy Riffe  Procedure(s) Performed: IRRIGATION AND DEBRIDEMENT WOUND (Right: Foot)  Patient Location: PACU  Anesthesia Type:General  Level of Consciousness: drowsy  Airway & Oxygen  Therapy: Patient Spontanous Breathing and Patient connected to face mask oxygen   Post-op Assessment: Report given to RN and Post -op Vital signs reviewed and stable  Post vital signs: Reviewed and stable  Last Vitals:  Vitals Value Taken Time  BP 118/80 06/05/24 14:16  Temp    Pulse 86 06/05/24 14:20  Resp 17 06/05/24 14:20  SpO2 100 % 06/05/24 14:20  Vitals shown include unfiled device data.  Last Pain:  Vitals:   06/05/24 1042  TempSrc: Temporal  PainSc: 0-No pain         Complications: No notable events documented.

## 2024-06-05 NOTE — Discharge Instructions (Signed)
[facility]  POST OPERATIVE INSTRUCTIONS FOR DR. Ether Griffins AND DR. BAKER [facility] CLINIC PODIATRY DEPARTMENT   Take your medication as prescribed.  Pain medication should be taken only as needed.  Keep the dressing clean, dry and intact.  Keep your foot elevated above the heart level for the first 48 hours.  We have instructed you to be non-weight bearing.  Always wear your post-op shoe when walking.  Always use your crutches if you are to be non-weight bearing.  Do not take a shower. Baths are permissible as long as the foot is kept out of the water.   Every hour you are awake:  Bend your knee 15 times. Flex foot 15 times Massage calf 15 times  Call [facility] [phone]) if any of the following problems occur: You develop a temperature or fever. The bandage becomes saturated with blood. Medication does not stop your pain. Injury of the foot occurs. Any symptoms of infection including redness, odor, or red streaks running from wound.

## 2024-06-05 NOTE — Op Note (Signed)
 Operative note   Surgeon:Jmya Uliano Armed forces logistics/support/administrative officer: None    Preop diagnosis: Dehisced wounds transmetatarsal amputation right foot    Postop diagnosis: Same    Procedure: 1.  Delayed primary closure right foot 2.  Excision of distal metatarsal 2, 3, 4, and 5 right foot    EBL: 50 mL    Anesthesia: General With local.    Hemostasis: None    Specimen: None    Complications: None    Operative indications:[name] is an 58 y.o. that presents today for surgical intervention.  The risks/benefits/alternatives/complications have been discussed and consent has been given.    Procedure:  Patient was brought into the OR and placed on the operating table in thesupine position. After anesthesia was obtained theright lower extremity was prepped and draped in usual sterile fashion.  Attention was directed to the dehisced wound on the right forefoot from the previous transmetatarsal amputation site.  At this time all previous sutures were removed.  Surgical dissection was performed with a combination of a 15 blade and Versajet.  A large amount of thick dense fibrotic tissue throughout the entire right forefoot was noted and excised at this time.  The thickened nonviable tissue was removed to the entire transmetatarsal amputation site at this time.  Attempted closure at this time did not allow primary closure.  At this time the decision was made to remove approximately 1 cm from the distal remaining metatarsals.  With a power saw a 1 cm excision was performed to the 2nd, 3rd, 4th and 5th metatarsals distally.  This was removed from the surgical field in toto.  Further flush and removal of nonviable tissue was performed with deep debridement performed with the Versajet.  At this time bleeders were Bovie cauterized.  The area was infiltrated with Surgicel powder for further hemostasis.  Closure was then performed with a combination of 2-0 Vicryl for the deeper tissue and 2-0 and 3-0 nylon as well as skin  staples.  Primary closure was obtained diffusely to the entire forefoot.  A large bulky sterile dressing was applied.  Intraoperatively no signs of infection were found throughout the site grossly.    Patient tolerated the procedure and anesthesia well.  Was transported from the OR to the PACU with all vital signs stable and vascular status intact. To be discharged per routine protocol.  Will follow up in approximately 1 week in the outpatient clinic.\

## 2024-06-05 NOTE — Anesthesia Preprocedure Evaluation (Signed)
 Anesthesia Evaluation  Patient identified by MRN, date of birth, ID band Patient awake    Reviewed: Allergy & Precautions, NPO status , Patient's Chart, lab work & pertinent test results  History of Anesthesia Complications Negative for: history of anesthetic complications  Airway Mallampati: III  TM Distance: >3 FB Neck ROM: full    Dental  (+) Chipped, Poor Dentition, Missing   Pulmonary neg shortness of breath, former smoker   Pulmonary exam normal        Cardiovascular Exercise Tolerance: Good hypertension, (-) angina (-) Past MI Normal cardiovascular exam     Neuro/Psych negative neurological ROS  negative psych ROS   GI/Hepatic negative GI ROS,,,(+)     substance abuse  cocaine use  Endo/Other  diabetes, Type 2    Renal/GU Renal disease  negative genitourinary   Musculoskeletal   Abdominal   Peds  Hematology negative hematology ROS (+)   Anesthesia Other Findings Past Medical History: No date: Amputation of right great toe (HCC) No date: Cellulitis and abscess of foot No date: CKD stage 3a, GFR 45-59 ml/min (HCC) No date: Cocaine abuse (HCC)     Comment:  + UDS on 05-12-24 No date: DM (diabetes mellitus), type 2 (HCC) 04/18/2024: ESBL (extended spectrum beta-lactamase) producing  bacteria infection No date: Hyperlipidemia No date: Hypertension No date: MRSA bacteremia No date: Normocytic anemia No date: Obesity No date: Tobacco abuse No date: Toe osteomyelitis, left (HCC) No date: Tuberculosis     Comment:  tested positive,treated with medications.  Past Surgical History: 03/26/2024: AMPUTATION TOE; Right     Comment:  Procedure: AMPUTATION, TOE;  Surgeon: Harden Jerona GAILS,               MD;  Location: [facility] OR;  Service: Orthopedics;  Laterality:              Right;  RIGHT GREAT TOE AMPUTATION 04/18/2024: BONE BIOPSY     Comment:  Procedure: BIOPSY, BONE (2ND METATARSAL);  Surgeon:                Ashley Soulier, DPM;  Location: [facility] ORS;  Service:               Orthopedics/Podiatry;; No date: FOOT SURGERY; Left 05/05/2022: I & D EXTREMITY; Left     Comment:  Procedure: LEFT FOOT DEBRIDEMENT;  Surgeon: Harden Jerona GAILS, MD;  Location: [facility] OR;  Service: Orthopedics;                Laterality: Left; 04/18/2024: IRRIGATION AND DEBRIDEMENT FOOT; Right     Comment:  Procedure: IRRIGATION AND DEBRIDEMENT FOOT, PLACEMENT OF              ANTIBIOTIC BEADS;  Surgeon: Ashley Soulier, DPM;                Location: [facility] ORS;  Service: Orthopedics/Podiatry;                Laterality: Right; 07/20/2022: MYRINGOTOMY WITH TUBE PLACEMENT; Bilateral     Comment:  Procedure: MYRINGOTOMY WITH TUBE PLACEMENT;  Surgeon:               Juengel, Paul, MD;  Location: [facility];                Service: ENT;  Laterality: Bilateral;  Diabetic No date: TONSILLECTOMY 04/23/2024: TRANSMETATARSAL AMPUTATION; Right     Comment:  Procedure: AMPUTATION, FOOT,  TRANSMETATARSAL;  Surgeon:               Ashley Soulier, DPM;  Location: [facility] ORS;  Service:               Orthopedics/Podiatry;  Laterality: Right;  BMI    Body Mass Index: 32.49 kg/m      Reproductive/Obstetrics negative OB ROS                              Anesthesia Physical Anesthesia Plan  ASA: 3  Anesthesia Plan: General   Post-op Pain Management:    Induction: Intravenous  PONV Risk Score and Plan: Propofol  infusion and TIVA  Airway Management Planned: Natural Airway and Nasal Cannula  Additional Equipment:   Intra-op Plan:   Post-operative Plan:   Informed Consent: I have reviewed the patients History and Physical, chart, labs and discussed the procedure including the risks, benefits and alternatives for the proposed anesthesia with the patient or authorized representative who has indicated his/her understanding and acceptance.     Dental Advisory Given  Plan Discussed with:  Anesthesiologist, CRNA and Surgeon  Anesthesia Plan Comments: (Patient consented for risks of anesthesia including but not limited to:  - adverse reactions to medications - risk of airway placement if required - damage to eyes, teeth, lips or other oral mucosa - nerve damage due to positioning  - sore throat or hoarseness - Damage to heart, brain, nerves, lungs, other parts of body or loss of life  Patient voiced understanding and assent.)        Anesthesia Quick Evaluation

## 2024-06-05 NOTE — H&P (Signed)
 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  The various methods of treatment have been discussed with the patient.  No guarantees were given.  After consideration of risks, benefits and other options for treatment, the patient has consented to surgery.  I have reviewed the patients' chart and labs.     A history and physical examination was performed in my office.  The patient was reexamined.  There have been no changes to this history and physical examination.  Ashley Soulier A

## 2024-06-05 NOTE — Anesthesia Procedure Notes (Signed)
 Procedure Name: LMA Insertion Date/Time: 06/05/2024 1:06 PM  Performed by: Dyane Mass, CRNAPre-anesthesia Checklist: Emergency Drugs available, Suction available and Patient being monitored Patient Re-evaluated:Patient Re-evaluated prior to induction Oxygen  Delivery Method: Circle system utilized Preoxygenation: Pre-oxygenation with 100% oxygen  Induction Type: IV induction LMA: LMA inserted LMA Size: 4.0 Number of attempts: 1 Tube secured with: Tape

## 2024-06-06 ENCOUNTER — Other Ambulatory Visit: Payer: Self-pay | Admitting: Family

## 2024-06-06 DIAGNOSIS — E1169 Type 2 diabetes mellitus with other specified complication: Secondary | ICD-10-CM

## 2024-06-06 NOTE — Anesthesia Postprocedure Evaluation (Signed)
 Anesthesia Post Note  Patient: [name]  Procedure(s) Performed: DELAYED PRIMARY CLOSURE OF RIGHT FOOT WOUND, EXCISION OF DISTAL METATARSAL 2, 3, 4, and 5 (Right: Foot)  Patient location during evaluation: PACU Anesthesia Type: General Level of consciousness: awake and alert Pain management: pain level controlled Vital Signs Assessment: post-procedure vital signs reviewed and stable Respiratory status: spontaneous breathing, nonlabored ventilation and respiratory function stable Cardiovascular status: blood pressure returned to baseline and stable Postop Assessment: no apparent nausea or vomiting Anesthetic complications: no   No notable events documented.   Last Vitals:  Vitals:   06/05/24 1445 06/05/24 1513  BP: 122/83 (!) 144/91  Pulse: 90 91  Resp: 16 16  Temp: (!) 36.3 C 36.6 C  SpO2: 100% 99%    Last Pain:  Vitals:   06/05/24 1513  TempSrc: Oral  PainSc: 0-No pain                 Fairy POUR Saryn Cherry

## 2024-06-07 ENCOUNTER — Encounter: Payer: Self-pay | Admitting: Podiatry

## 2024-06-09 ENCOUNTER — Telehealth: Payer: Self-pay

## 2024-06-09 NOTE — Telephone Encounter (Signed)
 Attempted to call patient to reschedule missed follow up appt with Calone FNP and discuss refill for Metformin . Not able to reach patient at this time. Voicemail not setup. Patient will need to schedule appt with PCP to discuss Metformin . Lorenda CHRISTELLA Code, RMA

## 2024-06-12 ENCOUNTER — Encounter (HOSPITAL_BASED_OUTPATIENT_CLINIC_OR_DEPARTMENT_OTHER): Attending: Internal Medicine | Admitting: Internal Medicine

## 2024-06-12 DIAGNOSIS — E11621 Type 2 diabetes mellitus with foot ulcer: Secondary | ICD-10-CM | POA: Diagnosis not present

## 2024-06-12 DIAGNOSIS — L97528 Non-pressure chronic ulcer of other part of left foot with other specified severity: Secondary | ICD-10-CM | POA: Diagnosis not present

## 2024-06-12 DIAGNOSIS — Z89421 Acquired absence of other right toe(s): Secondary | ICD-10-CM | POA: Insufficient documentation

## 2024-06-12 DIAGNOSIS — L97522 Non-pressure chronic ulcer of other part of left foot with fat layer exposed: Secondary | ICD-10-CM | POA: Insufficient documentation

## 2024-06-17 ENCOUNTER — Other Ambulatory Visit (HOSPITAL_COMMUNITY): Payer: Self-pay | Admitting: Internal Medicine

## 2024-06-17 DIAGNOSIS — L97528 Non-pressure chronic ulcer of other part of left foot with other specified severity: Secondary | ICD-10-CM

## 2024-06-17 NOTE — Progress Notes (Signed)
 Chief Complaint:   Chief Complaint  Patient presents with  . Post Operative Visit    Recheck left foot transmetatarsal amputation site.  New wounds left foot.    Subjective  HPI  [name] is a 58 y.o. male who presents for Post Operative Visit (Recheck left foot transmetatarsal amputation site. Jane wounds left foot.) History of Present Illness [name] is a 58 year old male who presents for follow-up of a foot wound.  He is status post attempted primary closure of the right foot with debridement of bone.  Also following up on plantar left forefoot ulceration.  He has been dealing with a foot wound that has shown some improvement but still requires ongoing care. He has been mostly bedridden for three days to avoid putting pressure on the foot, and his girlfriend has provided him with a device to help him stay off his feet.  He has been attending the wound center for further management. He has an upcoming appointment at the wound center next week.  He has been changing the dressing more frequently and has been using Betadine  for wound care. He canceled a previous appointment but has since been seen at the wound center on the tenth of the month.  Review of Systems  Patient Active Problem List  Diagnosis  . AKI (acute kidney injury) ()  . Amputation of right great toe ()  . Cellulitis and abscess of foot  . Chronic kidney disease, stage 3a (CMS/HHS-HCC)  . Diabetic foot infection  (CMS/HHS-HCC)  . Erectile dysfunction  . Hyperkalemia  . Hyperlipidemia  . Hypertension associated with diabetes (CMS/HHS-HCC)  . Injury of left foot  . Medication management  . MRSA bacteremia  . Necrotizing soft tissue infection  . Normocytic anemia  . Obesity (BMI 30-39.9), unspecified  . Pain in left foot  . PICC (peripherally inserted central catheter) in place  . Pyogenic inflammation of bone (CMS/HHS-HCC)  . Tobacco abuse  . Toe osteomyelitis, left (CMS/HHS-HCC)  . Type 2 diabetes mellitus  (CMS/HHS-HCC)  . Disorder associated with type 2 diabetes mellitus (CMS/HHS-HCC)    Outpatient Medications Prior to Visit  Medication Sig Dispense Refill  . amLODIPine  (NORVASC ) 5 MG tablet 1 tablet Orally Once a day; Duration: 90 days    . ascorbic acid , vitamin C , (VITAMIN C ) 500 MG tablet Take 500 mg by mouth once daily    . atorvastatin  (LIPITOR) 20 MG tablet Oral    . ergocalciferol , vitamin D2, 1,250 mcg (50,000 unit) capsule Take 50,000 Units by mouth every 7 (seven) days    . metFORMIN  (GLUCOPHAGE -XR) 500 MG XR tablet Take 1,000 mg by mouth 2 (two) times daily with meals    . gabapentin  (NEURONTIN ) 300 MG capsule Take 1 capsule (300 mg total) by mouth at bedtime 30 capsule 1   No facility-administered medications prior to visit.      Objective  Vitals:   06/17/24 1617  BP: 115/79  Weight: 99.8 kg (220 lb 0.3 oz)  Height: 175.3 cm (5' 9)  PainSc: 0-No pain  PainLoc: Foot   Body mass index is 32.49 kg/m.  Home Vitals:     Right foot surgical site unfortunately has complete dehiscence once again.  These sutures have pulled through as have the staples.  There has been obvious stress to this incision site on the right foot.  Fibrotic tissue was removed today.  No purulence was noted.  Mild edema.  Left plantar foot ulceration is still quite large.  Measuring over 2  cm at the fifth metatarsal and 3 cm to the forefoot area.  Right foot x-ray shows the previous transmetatarsal amputation site.  No fracturing or erosive changes at this time.   The following labs were personally reviewed: -No results found for: WBC, HGB, HCT, PLT -No results found for: NA, K, CL, CO2, BUN, CREATININE, GLUCOSE -No results found for: NA, K, CL, CO2, BUN, CREATININE, CALCIUM , TP, ALB, TBILI, ALKPHOS, AST, ALT, GLUCOSE, GFR -No results found for: HGBA1C    Results         Assessment/Plan:   Assessment & Plan Postoperative  wound dehiscence -The attempted delayed primary closure with debridement of the right foot has once again dehisced.  All the sutures have pulled through.  Obvious stress has been applied to the area.  Possible pressure and possible edema has caused this dehiscence. - Continue local wound care with more frequent dressing changes using Betadine  - He will follow-up with wound center for further assistance with this..  The left foot ulceration is stable.  Granular tissue is noted.  No purulent drainage at this time. - Consider a total contact cast to distribute pressure and promote healing, with weekly changes.  He has discussed total contact casting with wound care center. - Follow up with the wound center for further wound care recommendations.  Diagnoses and all orders for this visit:  Postoperative wound dehiscence, subsequent encounter -     X-ray foot right 3 plus views; Future  History of transmetatarsal amputation of right foot (CMS/HHS-HCC) -     X-ray foot right 3 plus views; Future  Type 2 diabetes mellitus with diabetic polyneuropathy, with long-term current use of insulin  (CMS/HHS-HCC)  Ulcer of left foot with fat layer exposed (CMS/HHS-HCC)          Future Appointments   This patient does not currently have any appointments scheduled.     There are no Patient Instructions on file for this visit.   This note has been created using automated tools and reviewed for accuracy by JUSTIN ALLEN FOWLER.

## 2024-06-21 ENCOUNTER — Ambulatory Visit (HOSPITAL_COMMUNITY)
Admission: RE | Admit: 2024-06-21 | Discharge: 2024-06-21 | Disposition: A | Discharge status: Home / Self Care | Source: Ambulatory Visit | Attending: Internal Medicine

## 2024-06-21 DIAGNOSIS — L97528 Non-pressure chronic ulcer of other part of left foot with other specified severity: Secondary | ICD-10-CM | POA: Diagnosis present

## 2024-06-21 MED ORDER — GADOBUTROL 1 MMOL/ML IV SOLN
10.0000 mL | Freq: Once | INTRAVENOUS | Status: AC | PRN
  Administered 2024-06-21: 10 mL via INTRAVENOUS

## 2024-06-26 ENCOUNTER — Inpatient Hospital Stay
Admission: EM | Admit: 2024-06-26 | Discharge: 2024-07-14 | DRG: 464 | Disposition: A | Discharge status: Skilled Nursing Facility | Attending: Internal Medicine | Admitting: Internal Medicine

## 2024-06-26 ENCOUNTER — Encounter (HOSPITAL_BASED_OUTPATIENT_CLINIC_OR_DEPARTMENT_OTHER): Admitting: Internal Medicine

## 2024-06-26 ENCOUNTER — Emergency Department

## 2024-06-26 ENCOUNTER — Other Ambulatory Visit: Payer: Self-pay

## 2024-06-26 ENCOUNTER — Encounter: Payer: Self-pay | Admitting: Emergency Medicine

## 2024-06-26 DIAGNOSIS — Z79899 Other long term (current) drug therapy: Secondary | ICD-10-CM

## 2024-06-26 DIAGNOSIS — M86171 Other acute osteomyelitis, right ankle and foot: Secondary | ICD-10-CM | POA: Diagnosis present

## 2024-06-26 DIAGNOSIS — E11628 Type 2 diabetes mellitus with other skin complications: Secondary | ICD-10-CM | POA: Diagnosis present

## 2024-06-26 DIAGNOSIS — I38 Endocarditis, valve unspecified: Secondary | ICD-10-CM

## 2024-06-26 DIAGNOSIS — M24475 Recurrent dislocation, left foot: Secondary | ICD-10-CM | POA: Diagnosis present

## 2024-06-26 DIAGNOSIS — Z89411 Acquired absence of right great toe: Secondary | ICD-10-CM

## 2024-06-26 DIAGNOSIS — M25561 Pain in right knee: Secondary | ICD-10-CM | POA: Diagnosis not present

## 2024-06-26 DIAGNOSIS — Z794 Long term (current) use of insulin: Secondary | ICD-10-CM | POA: Diagnosis not present

## 2024-06-26 DIAGNOSIS — B9562 Methicillin resistant Staphylococcus aureus infection as the cause of diseases classified elsewhere: Secondary | ICD-10-CM | POA: Diagnosis present

## 2024-06-26 DIAGNOSIS — E66811 Obesity, class 1: Secondary | ICD-10-CM | POA: Diagnosis present

## 2024-06-26 DIAGNOSIS — L03115 Cellulitis of right lower limb: Secondary | ICD-10-CM | POA: Diagnosis present

## 2024-06-26 DIAGNOSIS — B961 Klebsiella pneumoniae [K. pneumoniae] as the cause of diseases classified elsewhere: Secondary | ICD-10-CM | POA: Diagnosis present

## 2024-06-26 DIAGNOSIS — R7881 Bacteremia: Secondary | ICD-10-CM | POA: Diagnosis present

## 2024-06-26 DIAGNOSIS — E1122 Type 2 diabetes mellitus with diabetic chronic kidney disease: Secondary | ICD-10-CM | POA: Diagnosis present

## 2024-06-26 DIAGNOSIS — L03116 Cellulitis of left lower limb: Secondary | ICD-10-CM | POA: Diagnosis present

## 2024-06-26 DIAGNOSIS — Z89421 Acquired absence of other right toe(s): Secondary | ICD-10-CM

## 2024-06-26 DIAGNOSIS — E1142 Type 2 diabetes mellitus with diabetic polyneuropathy: Secondary | ICD-10-CM | POA: Diagnosis present

## 2024-06-26 DIAGNOSIS — N179 Acute kidney failure, unspecified: Secondary | ICD-10-CM | POA: Diagnosis not present

## 2024-06-26 DIAGNOSIS — T8743 Infection of amputation stump, right lower extremity: Secondary | ICD-10-CM | POA: Diagnosis present

## 2024-06-26 DIAGNOSIS — N1831 Chronic kidney disease, stage 3a: Secondary | ICD-10-CM | POA: Diagnosis present

## 2024-06-26 DIAGNOSIS — D75839 Thrombocytosis, unspecified: Secondary | ICD-10-CM | POA: Diagnosis not present

## 2024-06-26 DIAGNOSIS — L97522 Non-pressure chronic ulcer of other part of left foot with fat layer exposed: Secondary | ICD-10-CM | POA: Diagnosis present

## 2024-06-26 DIAGNOSIS — T8131XA Disruption of external operation (surgical) wound, not elsewhere classified, initial encounter: Secondary | ICD-10-CM | POA: Diagnosis present

## 2024-06-26 DIAGNOSIS — E1165 Type 2 diabetes mellitus with hyperglycemia: Secondary | ICD-10-CM | POA: Diagnosis present

## 2024-06-26 DIAGNOSIS — M726 Necrotizing fasciitis: Secondary | ICD-10-CM | POA: Diagnosis not present

## 2024-06-26 DIAGNOSIS — B9620 Unspecified Escherichia coli [E. coli] as the cause of diseases classified elsewhere: Secondary | ICD-10-CM | POA: Diagnosis present

## 2024-06-26 DIAGNOSIS — Z7984 Long term (current) use of oral hypoglycemic drugs: Secondary | ICD-10-CM

## 2024-06-26 DIAGNOSIS — L97528 Non-pressure chronic ulcer of other part of left foot with other specified severity: Secondary | ICD-10-CM | POA: Diagnosis not present

## 2024-06-26 DIAGNOSIS — E11621 Type 2 diabetes mellitus with foot ulcer: Secondary | ICD-10-CM

## 2024-06-26 DIAGNOSIS — Z91199 Patient's noncompliance with other medical treatment and regimen due to unspecified reason: Secondary | ICD-10-CM | POA: Diagnosis not present

## 2024-06-26 DIAGNOSIS — L089 Local infection of the skin and subcutaneous tissue, unspecified: Principal | ICD-10-CM

## 2024-06-26 DIAGNOSIS — Z89431 Acquired absence of right foot: Secondary | ICD-10-CM | POA: Diagnosis not present

## 2024-06-26 DIAGNOSIS — I129 Hypertensive chronic kidney disease with stage 1 through stage 4 chronic kidney disease, or unspecified chronic kidney disease: Secondary | ICD-10-CM | POA: Diagnosis present

## 2024-06-26 DIAGNOSIS — Z6832 Body mass index (BMI) 32.0-32.9, adult: Secondary | ICD-10-CM | POA: Diagnosis not present

## 2024-06-26 DIAGNOSIS — L02619 Cutaneous abscess of unspecified foot: Secondary | ICD-10-CM

## 2024-06-26 DIAGNOSIS — E785 Hyperlipidemia, unspecified: Secondary | ICD-10-CM | POA: Insufficient documentation

## 2024-06-26 DIAGNOSIS — Z87891 Personal history of nicotine dependence: Secondary | ICD-10-CM | POA: Diagnosis not present

## 2024-06-26 DIAGNOSIS — M86672 Other chronic osteomyelitis, left ankle and foot: Secondary | ICD-10-CM | POA: Diagnosis not present

## 2024-06-26 DIAGNOSIS — I10 Essential (primary) hypertension: Secondary | ICD-10-CM | POA: Insufficient documentation

## 2024-06-26 DIAGNOSIS — B9561 Methicillin susceptible Staphylococcus aureus infection as the cause of diseases classified elsewhere: Secondary | ICD-10-CM | POA: Diagnosis not present

## 2024-06-26 DIAGNOSIS — E875 Hyperkalemia: Secondary | ICD-10-CM

## 2024-06-26 DIAGNOSIS — D631 Anemia in chronic kidney disease: Secondary | ICD-10-CM | POA: Diagnosis present

## 2024-06-26 DIAGNOSIS — Z1612 Extended spectrum beta lactamase (ESBL) resistance: Secondary | ICD-10-CM | POA: Diagnosis not present

## 2024-06-26 DIAGNOSIS — M79671 Pain in right foot: Secondary | ICD-10-CM | POA: Diagnosis present

## 2024-06-26 LAB — LACTIC ACID, PLASMA
Lactic Acid, Venous: 1.6 mmol/L (ref 0.5–1.9)
Lactic Acid, Venous: 0.9 mmol/L (ref 0.5–1.9)

## 2024-06-26 LAB — COMPREHENSIVE METABOLIC PANEL WITH GFR
ALT: 9 U/L (ref 0–44)
AST: 11 U/L — ABNORMAL LOW (ref 15–41)
Albumin: 2.8 g/dL — ABNORMAL LOW (ref 3.5–5.0)
Alkaline Phosphatase: 88 U/L (ref 38–126)
Anion gap: 11 (ref 5–15)
BUN: 17 mg/dL (ref 6–20)
CO2: 24 mmol/L (ref 22–32)
Calcium: 8.7 mg/dL — ABNORMAL LOW (ref 8.9–10.3)
Chloride: 100 mmol/L (ref 98–111)
Creatinine, Ser: 1.42 mg/dL — ABNORMAL HIGH (ref 0.61–1.24)
GFR, Estimated: 58 mL/min — ABNORMAL LOW (ref >60)
Glucose, Bld: 345 mg/dL — ABNORMAL HIGH (ref 70–99)
Potassium: 5 mmol/L (ref 3.5–5.1)
Sodium: 135 mmol/L (ref 135–145)
Total Bilirubin: 0.4 mg/dL (ref 0.0–1.2)
Total Protein: 7.5 g/dL (ref 6.5–8.1)

## 2024-06-26 LAB — CBC WITH DIFFERENTIAL/PLATELET
Abs Immature Granulocytes: 0.07 K/uL (ref 0.00–0.07)
Basophils Absolute: 0 K/uL (ref 0.0–0.1)
Basophils Relative: 0 %
Eosinophils Absolute: 0.1 K/uL (ref 0.0–0.5)
Eosinophils Relative: 1 %
HCT: 29.2 % — ABNORMAL LOW (ref 39.0–52.0)
Hemoglobin: 8.7 g/dL — ABNORMAL LOW (ref 13.0–17.0)
Immature Granulocytes: 1 %
Lymphocytes Relative: 14 %
Lymphs Abs: 1.7 K/uL (ref 0.7–4.0)
MCH: 24.3 pg — ABNORMAL LOW (ref 26.0–34.0)
MCHC: 29.8 g/dL — ABNORMAL LOW (ref 30.0–36.0)
MCV: 81.6 fL (ref 80.0–100.0)
Monocytes Absolute: 0.6 K/uL (ref 0.1–1.0)
Monocytes Relative: 5 %
Neutro Abs: 9.8 K/uL — ABNORMAL HIGH (ref 1.7–7.7)
Neutrophils Relative %: 79 %
Platelets: 493 K/uL — ABNORMAL HIGH (ref 150–400)
RBC: 3.58 MIL/uL — ABNORMAL LOW (ref 4.22–5.81)
RDW: 14.8 % (ref 11.5–15.5)
WBC: 12.3 K/uL — ABNORMAL HIGH (ref 4.0–10.5)
nRBC: 0 % (ref 0.0–0.2)

## 2024-06-26 LAB — PROTIME-INR
INR: 1.2 (ref 0.8–1.2)
Prothrombin Time: 15.4 s — ABNORMAL HIGH (ref 11.4–15.2)

## 2024-06-26 LAB — GLUCOSE, CAPILLARY: Glucose-Capillary: 252 mg/dL — ABNORMAL HIGH (ref 70–99)

## 2024-06-26 MED ORDER — VITAMIN B-12 1000 MCG PO TABS
1000.0000 ug | ORAL_TABLET | Freq: Every day | ORAL | Status: DC
  Administered 2024-06-28 – 2024-07-14 (×17): 1000 ug via ORAL
  Filled 2024-06-26 (×17): qty 1

## 2024-06-26 MED ORDER — NITROGLYCERIN 0.2 MG/HR TD PT24
0.2000 mg | MEDICATED_PATCH | Freq: Every day | TRANSDERMAL | Status: DC
  Administered 2024-06-28: 0.2 mg via TRANSDERMAL
  Filled 2024-06-26 (×10): qty 1

## 2024-06-26 MED ORDER — ACETAMINOPHEN 325 MG PO TABS
650.0000 mg | ORAL_TABLET | Freq: Four times a day (QID) | ORAL | Status: DC | PRN
  Administered 2024-06-26 – 2024-06-27 (×2): 650 mg via ORAL
  Filled 2024-06-26 (×2): qty 2

## 2024-06-26 MED ORDER — PIPERACILLIN-TAZOBACTAM 3.375 G IVPB
3.3750 g | Freq: Three times a day (TID) | INTRAVENOUS | Status: DC
  Administered 2024-06-27 (×2): 3.375 g via INTRAVENOUS
  Filled 2024-06-26 (×2): qty 50

## 2024-06-26 MED ORDER — MORPHINE SULFATE (PF) 4 MG/ML IV SOLN
4.0000 mg | Freq: Once | INTRAVENOUS | Status: AC
  Administered 2024-06-26: 4 mg via INTRAVENOUS
  Filled 2024-06-26: qty 1

## 2024-06-26 MED ORDER — VANCOMYCIN HCL IN DEXTROSE 1-5 GM/200ML-% IV SOLN
1000.0000 mg | Freq: Once | INTRAVENOUS | Status: AC
  Administered 2024-06-27: 1000 mg via INTRAVENOUS
  Filled 2024-06-26: qty 200

## 2024-06-26 MED ORDER — ENOXAPARIN SODIUM 60 MG/0.6ML IJ SOSY
0.5000 mg/kg | PREFILLED_SYRINGE | INTRAMUSCULAR | Status: DC
  Administered 2024-06-26 – 2024-07-13 (×18): 50 mg via SUBCUTANEOUS
  Filled 2024-06-26 (×18): qty 0.6

## 2024-06-26 MED ORDER — POLYETHYLENE GLYCOL 3350 17 G PO PACK
17.0000 g | PACK | Freq: Two times a day (BID) | ORAL | Status: DC | PRN
  Administered 2024-07-06: 17 g via ORAL
  Filled 2024-06-26: qty 1

## 2024-06-26 MED ORDER — OXYCODONE-ACETAMINOPHEN 5-325 MG PO TABS
1.0000 | ORAL_TABLET | Freq: Four times a day (QID) | ORAL | Status: DC | PRN
  Administered 2024-06-26 – 2024-06-27 (×5): 2 via ORAL
  Administered 2024-06-28: 1 via ORAL
  Administered 2024-06-28: 2 via ORAL
  Filled 2024-06-26 (×7): qty 2

## 2024-06-26 MED ORDER — MUPIROCIN 2 % EX OINT
1.0000 | TOPICAL_OINTMENT | Freq: Two times a day (BID) | CUTANEOUS | Status: DC
  Administered 2024-06-26 – 2024-07-14 (×30): 1 via NASAL
  Filled 2024-06-26: qty 22

## 2024-06-26 MED ORDER — ONDANSETRON HCL 4 MG PO TABS
4.0000 mg | ORAL_TABLET | Freq: Four times a day (QID) | ORAL | Status: DC | PRN
Start: 2024-06-26 — End: 2024-07-15

## 2024-06-26 MED ORDER — CIPROFLOXACIN IN D5W 400 MG/200ML IV SOLN
400.0000 mg | Freq: Once | INTRAVENOUS | Status: AC
  Administered 2024-06-26: 400 mg via INTRAVENOUS
  Filled 2024-06-26: qty 200

## 2024-06-26 MED ORDER — ONDANSETRON HCL 4 MG/2ML IJ SOLN
4.0000 mg | Freq: Once | INTRAMUSCULAR | Status: AC
  Administered 2024-06-26: 4 mg via INTRAVENOUS
  Filled 2024-06-26: qty 2

## 2024-06-26 MED ORDER — ACETAMINOPHEN 650 MG RE SUPP: 650.0000 mg | Freq: Four times a day (QID) | RECTAL | Status: DC | PRN

## 2024-06-26 MED ORDER — TRAZODONE HCL 50 MG PO TABS
25.0000 mg | ORAL_TABLET | Freq: Every evening | ORAL | Status: DC | PRN
  Administered 2024-06-26 – 2024-07-13 (×8): 25 mg via ORAL
  Filled 2024-06-26 (×9): qty 1

## 2024-06-26 MED ORDER — VANCOMYCIN HCL 1250 MG/250ML IV SOLN
1250.0000 mg | Freq: Once | INTRAVENOUS | Status: AC
  Administered 2024-06-26: 1250 mg via INTRAVENOUS
  Filled 2024-06-26: qty 250

## 2024-06-26 MED ORDER — ONDANSETRON HCL 4 MG/2ML IJ SOLN: 4.0000 mg | Freq: Four times a day (QID) | INTRAMUSCULAR | Status: DC | PRN

## 2024-06-26 MED ORDER — VITAMIN D (ERGOCALCIFEROL) 1.25 MG (50000 UNIT) PO CAPS
50000.0000 [IU] | ORAL_CAPSULE | ORAL | Status: DC
  Administered 2024-07-01 – 2024-07-08 (×2): 50000 [IU] via ORAL
  Filled 2024-06-26 (×2): qty 1

## 2024-06-26 MED ORDER — GADOBUTROL 1 MMOL/ML IV SOLN
10.0000 mL | Freq: Once | INTRAVENOUS | Status: AC | PRN
  Administered 2024-06-26: 10 mL via INTRAVENOUS

## 2024-06-26 MED ORDER — PIPERACILLIN-TAZOBACTAM 3.375 G IVPB 30 MIN: 3.3750 g | Freq: Once | INTRAVENOUS | Status: DC

## 2024-06-26 MED ORDER — SODIUM CHLORIDE 0.9 % IV SOLN: INTRAVENOUS | Status: AC

## 2024-06-26 MED ORDER — VANCOMYCIN VARIABLE DOSE PER UNSTABLE RENAL FUNCTION (PHARMACIST DOSING): Status: DC

## 2024-06-26 MED ORDER — POLYETHYLENE GLYCOL 3350 17 GM/SCOOP PO POWD: 17.0000 g | Freq: Two times a day (BID) | ORAL | Status: DC | PRN

## 2024-06-26 MED ORDER — INSULIN NPH (HUMAN) (ISOPHANE) 100 UNIT/ML ~~LOC~~ SUSP
22.0000 [IU] | Freq: Two times a day (BID) | SUBCUTANEOUS | Status: DC
  Administered 2024-06-26 – 2024-06-30 (×6): 22 [IU] via SUBCUTANEOUS
  Filled 2024-06-26: qty 10

## 2024-06-26 MED ORDER — METHOCARBAMOL 500 MG PO TABS
750.0000 mg | ORAL_TABLET | Freq: Four times a day (QID) | ORAL | Status: DC | PRN
  Administered 2024-06-27 – 2024-07-01 (×7): 750 mg via ORAL
  Filled 2024-06-26 (×8): qty 2

## 2024-06-26 MED ORDER — POLYSACCHARIDE IRON COMPLEX 150 MG PO CAPS
150.0000 mg | ORAL_CAPSULE | Freq: Every day | ORAL | Status: DC
  Administered 2024-06-28 – 2024-07-14 (×17): 150 mg via ORAL
  Filled 2024-06-26 (×18): qty 1

## 2024-06-26 MED ORDER — VITAMIN C 500 MG PO TABS
500.0000 mg | ORAL_TABLET | Freq: Every day | ORAL | Status: DC
  Administered 2024-06-28 – 2024-07-14 (×17): 500 mg via ORAL
  Filled 2024-06-26 (×17): qty 1

## 2024-06-26 MED ORDER — GABAPENTIN 300 MG PO CAPS
300.0000 mg | ORAL_CAPSULE | Freq: Every day | ORAL | Status: DC
  Administered 2024-06-26 – 2024-06-27 (×2): 300 mg via ORAL
  Filled 2024-06-26 (×2): qty 1

## 2024-06-26 NOTE — H&P (Signed)
 [place]   PATIENT NAME: [name]    MR#:  [id number]  DATE OF BIRTH:  [birth year]  DATE OF ADMISSION:  06/26/2024  PRIMARY CARE PHYSICIAN: [name], MD   Patient is coming from: Home  REQUESTING/REFERRING PHYSICIAN: Gasper Pulling, PA-C  CHIEF COMPLAINT:   Chief Complaint  Patient presents with   Foot Pain    HISTORY OF PRESENT ILLNESS:  Johneric Mcfadden is a 58 y.o. African-American male with medical history significant for hypertension, dyslipidemia, tobacco abuse, stage IIIa chronic kidney disease, cocaine abuse, and type 2 diabetes mellitus, who presented to the emergency room with acute onset of right foot pain with drainage from a recent toe amputation wound of clear fluid.  No fever or chills.  No nausea or vomiting or abdominal pain.  No chest pain or palpitations.  No cough or wheezing or dyspnea.  No dysuria, oliguria or hematuria or flank pain.  ED Course: When the patient came to the ER, vital signs were within normal.  Labs revealed hyperglycemia of 345, creatinine of 1.42 and albumin 2.8 with otherwise unremarkable CMP.  CBC showed leukocytosis of 12.3 with neutrophilia and anemia with hemoglobin 8.7 hematocrit 29.2 and thrombocytosis of 493.  Lactic acid was 1.6 and later 0.9.  Blood cultures were drawn.  Wound culture was obtained. EKG as reviewed by me :  None. Imaging: MRI of the foot with and without contrast revealed the following: 1. Status post interval second through fifth ray amputation at the level of the mid metatarsal shafts. Associated soft tissue wound extending along the dorsal stump into the deep soft tissues overlying the second through fifth digit metatarsal transsection margins which demonstrate T2/STIR hyperintense marrow edema with corresponding T1 hypointensity. These findings are concerning for osteomyelitis of the remnant second through fifth metatarsals, although, recent postsurgical change can demonstrate a similar appearance.  Cellulitis cannot be excluded. No drainable abscess. 2. Diffuse nonspecific increased T2 signal of the intrinsic musculature of the foot, myositis cannot be excluded. 3. Tenosynovitis of the posterior tibialis, flexor digitorum, and flexor hallucis longus tendons.  The patient was given 400 mg IV Cipro , 4 mg of IV morphine  sulfate and 4 mg of IV Zofran .  He will be admitted to a medical-surgical bed for further evaluation and management.  PAST MEDICAL HISTORY:   Past Medical History:  Diagnosis Date   Amputation of right great toe (HCC)    Cellulitis and abscess of foot    CKD stage 3a, GFR 45-59 ml/min (HCC)    Cocaine abuse (HCC)    + UDS on 05-12-24   DM (diabetes mellitus), type 2 (HCC)    ESBL (extended spectrum beta-lactamase) producing bacteria infection 04/18/2024   Hyperlipidemia    Hypertension    MRSA bacteremia    Normocytic anemia    Obesity    Tobacco abuse    Toe osteomyelitis, left (HCC)    Tuberculosis    tested positive,treated with medications.    PAST SURGICAL HISTORY:   Past Surgical History:  Procedure Laterality Date   AMPUTATION TOE Right 03/26/2024   Procedure: AMPUTATION, TOE;  Surgeon: Harden Jerona GAILS, MD;  Location: [facility] OR;  Service: Orthopedics;  Laterality: Right;  RIGHT GREAT TOE AMPUTATION   BONE BIOPSY  04/18/2024   Procedure: BIOPSY, BONE (2ND METATARSAL);  Surgeon: Ashley Soulier, DPM;  Location: [facility] ORS;  Service: Orthopedics/Podiatry;;   FOOT SURGERY Left    I & D EXTREMITY Left 05/05/2022   Procedure: LEFT FOOT DEBRIDEMENT;  Surgeon: Harden Jerona GAILS, MD;  Location: [facility] OR;  Service: Orthopedics;  Laterality: Left;   INCISION AND DRAINAGE OF WOUND Right 06/05/2024   Procedure: DELAYED PRIMARY CLOSURE OF RIGHT FOOT WOUND, EXCISION OF DISTAL METATARSAL 2, 3, 4, and 5;  Surgeon: Ashley Soulier, DPM;  Location: [facility] ORS;  Service: Orthopedics/Podiatry;  Laterality: Right;   IRRIGATION AND DEBRIDEMENT FOOT Right 04/18/2024   Procedure: IRRIGATION AND  DEBRIDEMENT FOOT, PLACEMENT OF ANTIBIOTIC BEADS;  Surgeon: Ashley Soulier, DPM;  Location: [facility] ORS;  Service: Orthopedics/Podiatry;  Laterality: Right;   MYRINGOTOMY WITH TUBE PLACEMENT Bilateral 07/20/2022   Procedure: MYRINGOTOMY WITH TUBE PLACEMENT;  Surgeon: Juengel, Paul, MD;  Location: [facility];  Service: ENT;  Laterality: Bilateral;  Diabetic   TONSILLECTOMY     TRANSMETATARSAL AMPUTATION Right 04/23/2024   Procedure: AMPUTATION, FOOT, TRANSMETATARSAL;  Surgeon: Ashley Soulier, DPM;  Location: [facility] ORS;  Service: Orthopedics/Podiatry;  Laterality: Right;    SOCIAL HISTORY:   Social History   Tobacco Use   Smoking status: Former    Current packs/day: 0.00    Average packs/day: 0.3 packs/day for 25.0 years (6.3 ttl pk-yrs)    Types: Cigarettes    Start date: 09/17/1997    Quit date: 09/17/2022    Years since quitting: 1.7   Smokeless tobacco: Never   Tobacco comments:    Quit  Substance Use Topics   Alcohol use: No    FAMILY HISTORY:   Family History  Problem Relation Age of Onset   Colon cancer Neg Hx    Colon polyps Neg Hx    Crohn's disease Neg Hx    Esophageal cancer Neg Hx    Rectal cancer Neg Hx    Stomach cancer Neg Hx    Ulcerative colitis Neg Hx     DRUG ALLERGIES:  No Known Allergies  REVIEW OF SYSTEMS:   ROS As per history of present illness. All pertinent systems were reviewed above. Constitutional, HEENT, cardiovascular, respiratory, GI, GU, musculoskeletal, neuro, psychiatric, endocrine, integumentary and hematologic systems were reviewed and are otherwise negative/unremarkable except for positive findings mentioned above in the HPI.   MEDICATIONS AT HOME:   Prior to Admission medications   Medication Sig Start Date End Date Taking? Authorizing Provider  Accu-Chek Softclix Lancets lancets Use to check blood sugar 3 times daily. 04/01/24   Newlin, Enobong, MD  ascorbic acid  (VITAMIN C ) 500 MG tablet Take 1 tablet (500 mg total) by  mouth daily. 04/30/24 07/29/24  Von Bellis, MD  bisacodyl  (DULCOLAX) 5 MG EC tablet Take 2 tablets (10 mg total) by mouth at bedtime as needed for moderate constipation. 04/29/24   Von Bellis, MD  Blood Glucose Monitoring Suppl (ACCU-CHEK GUIDE) w/Device KIT Use to check blood sugar 3 times daily. 04/01/24   Newlin, Enobong, MD  chlorhexidine  (HIBICLENS ) 4 % external liquid Apply 15 mLs (1 Application total) topically as directed for 30 doses. Use as directed daily for 5 days every other week for 6 weeks. 06/05/24   Ashley Soulier, DPM  Continuous Blood Gluc Transmit (DEXCOM G6 TRANSMITTER) MISC Use to check blood sugar three times daily. Change transmitter once every 909 days. E11.69 Patient not taking: Reported on 06/05/2024 06/05/22   Newlin, Enobong, MD  cyanocobalamin  1000 MCG tablet Take 1 tablet (1,000 mcg total) by mouth daily. 04/30/24 07/29/24  Von Bellis, MD  doxycycline  (VIBRA -TABS) 100 MG tablet Take 1 tablet (100 mg total) by mouth 2 (two) times daily. Patient not taking: Reported on 06/05/2024 05/13/24   Calone,  Cordella BIRCH, FNP  gabapentin  (NEURONTIN ) 300 MG capsule Take 1 capsule (300 mg total) by mouth at bedtime. 09/11/22   Newlin, Enobong, MD  glucose blood (ACCU-CHEK GUIDE TEST) test strip Use to check blood sugar 3 times daily. 04/01/24   Newlin, Enobong, MD  insulin  aspart (NOVOLOG ) 100 UNIT/ML FlexPen Inject 0-15 Units into the skin 3 (three) times daily before meals.  Correction coverage: Moderate (average weight, post-op) CBG 70 - 120: 0 units CBG 121 - 150: 2 units CBG 151 - 200: 3 units CBG 201 - 250: 5 units CBG 251 - 300: 8 units CBG 301 - 350: 11 units CBG 351 - 400: 15 units CBG > 400: call MD 04/29/24   Von Bellis, MD  Insulin  NPH, Human,, Isophane, (HUMULIN  N KWIKPEN) 100 UNIT/ML Kiwkpen Inject 22 Units into the skin 2 (two) times daily. 04/29/24   Von Bellis, MD  Insulin  Pen Needle 32G X 4 MM MISC Use as directed to inject insulin  up to 4 times daily. 11/21/22    Newlin, Enobong, MD  iron  polysaccharides (NIFEREX) 150 MG capsule Take 1 capsule (150 mg total) by mouth daily. 04/30/24 07/29/24  Von Bellis, MD  losartan  (COZAAR ) 25 MG tablet Take 1 tablet (25 mg total) by mouth daily. Patient taking differently: Take 25 mg by mouth every morning. 04/30/24 04/30/25  Von Bellis, MD  metFORMIN  (GLUCOPHAGE -XR) 500 MG 24 hr tablet Take 2 tablets (1,000 mg total) by mouth 2 (two) times daily with a meal. 05/13/24   Calone, Gregory D, FNP  methocarbamol  (ROBAXIN ) 750 MG tablet Take 1 tablet (750 mg total) by mouth every 6 (six) hours as needed for muscle spasms. 03/28/24   Samtani, Jai-Gurmukh, MD  mupirocin  ointment (BACTROBAN ) 2 % Use as directed into the nose  2 times daily for 5 days every other week for 6 weeks. 06/05/24 07/05/24  Ashley Soulier, DPM  nitroGLYCERIN  (NITRODUR - DOSED IN MG/24 HR) 0.2 mg/hr patch Place 1 patch (0.2 mg total) onto the skin daily. 04/01/24   Harden Jerona GAILS, MD  oxyCODONE -acetaminophen  (PERCOCET) 5-325 MG tablet Take 1-2 tablets by mouth every 6 (six) hours as needed for severe pain (pain score 7-10). Max 6 tabs per day 06/05/24   Fowler, Justin, DPM  polyethylene glycol powder (GLYCOLAX /MIRALAX ) 17 GM/SCOOP powder Take 17 g by mouth 2 (two) times daily. Mix as directed. Patient taking differently: Take 17 g by mouth 2 (two) times daily as needed for mild constipation. Mix as directed. 04/29/24   Von Bellis, MD  Vitamin D , Ergocalciferol , (DRISDOL ) 1.25 MG (50000 UNIT) CAPS capsule Take 1 capsule (50,000 Units total) by mouth every 7 (seven) days. Patient taking differently: Take 50,000 Units by mouth every Tuesday. 05/03/24 08/01/24  Von Bellis, MD      VITAL SIGNS:  Blood pressure 137/77, pulse 87, temperature 98.4 F (36.9 C), resp. rate 18, height 5' 10 (1.778 m), weight 102.1 kg, SpO2 100%.  PHYSICAL EXAMINATION:  Physical Exam  GENERAL:  58 y.o.-year-old patient lying in the bed with no acute distress.  EYES: Pupils equal,  round, reactive to light and accommodation. No scleral icterus. Extraocular muscles intact.  HEENT: Head atraumatic, normocephalic. Oropharynx and nasopharynx clear.  NECK:  Supple, no jugular venous distention. No thyroid  enlargement, no tenderness.  LUNGS: Normal breath sounds bilaterally, no wheezing, rales,rhonchi or crepitation. No use of accessory muscles of respiration.  CARDIOVASCULAR: Regular rate and rhythm, S1, S2 normal. No murmurs, rubs, or gallops.  ABDOMEN: Soft, nondistended, nontender. Bowel sounds present.  No organomegaly or mass.  EXTREMITIES: No pedal edema, cyanosis, or clubbing.  NEUROLOGIC: Cranial nerves II through XII are intact. Muscle strength 5/5 in all extremities. Sensation intact. Gait not checked.  PSYCHIATRIC: The patient is alert and oriented x 3.  Normal affect and good eye contact. SKIN: The patient is status post right fifth digit ray amputation with wound dehiscence and clear drainage with mild generalized swelling induration and tenderness.SABRA  LABORATORY PANEL:   CBC Recent Labs  Lab 06/26/24 1337  WBC 12.3*  HGB 8.7*  HCT 29.2*  PLT 493*   ------------------------------------------------------------------------------------------------------------------  Chemistries  Recent Labs  Lab 06/26/24 1337  NA 135  K 5.0  CL 100  CO2 24  GLUCOSE 345*  BUN 17  CREATININE 1.42*  CALCIUM  8.7*  AST 11*  ALT 9  ALKPHOS 88  BILITOT 0.4   ------------------------------------------------------------------------------------------------------------------  Cardiac Enzymes No results for input(s): TROPONINI in the last 168 hours. ------------------------------------------------------------------------------------------------------------------  RADIOLOGY:  MR FOOT RIGHT W WO CONTRAST Result Date: 06/26/2024 CLINICAL DATA:  Right foot pain and wounds. Status post second through fifth mid transmetatarsal amputation on 06/05/2024. EXAM: MRI OF THE RIGHT  FOREFOOT WITHOUT AND WITH CONTRAST TECHNIQUE: Multiplanar, multisequence MR imaging of the right forefoot was performed before and after the administration of intravenous contrast. CONTRAST:  10mL GADAVIST  GADOBUTROL  1 MMOL/ML IV SOLN COMPARISON:  MRI of the right foot dated 02/16/2024. Right foot radiographs dated 02/21/2024. FINDINGS: Bones/Joint/Cartilage Status post interval second through fifth ray amputation at the level of the mid metatarsal shafts. Associated soft tissue wound extending along the dorsal stump into the deep soft tissues overlying the second through fifth digit metatarsal transsection margins which demonstrate T2/STIR hyperintense marrow edema with corresponding T1 hypointensity. These findings are concerning for osteomyelitis, although, recent postsurgical change can demonstrate a similar appearance. Prior first ray amputation. Ligaments Lisfranc ligament is intact. Muscles and Tendons Diffuse nonspecific increased T2 signal of the intrinsic musculature of the foot, myositis cannot be excluded. Tenosynovitis of the posterior tibialis, flexor digitorum, and flexor hallucis longus tendons. Soft tissue Soft tissue wound with marked edema extending along the dorsal stump into the deep soft tissues overlying the second through fifth digit transmetatarsal transsection margins. No drainable abscess identified. IMPRESSION: 1. Status post interval second through fifth ray amputation at the level of the mid metatarsal shafts. Associated soft tissue wound extending along the dorsal stump into the deep soft tissues overlying the second through fifth digit metatarsal transsection margins which demonstrate T2/STIR hyperintense marrow edema with corresponding T1 hypointensity. These findings are concerning for osteomyelitis of the remnant second through fifth metatarsals, although, recent postsurgical change can demonstrate a similar appearance. Cellulitis cannot be excluded. No drainable abscess. 2.  Diffuse nonspecific increased T2 signal of the intrinsic musculature of the foot, myositis cannot be excluded. 3. Tenosynovitis of the posterior tibialis, flexor digitorum, and flexor hallucis longus tendons. Electronically Signed   By: Harrietta Sherry M.D.   On: 06/26/2024 18:37      IMPRESSION AND PLAN:  Assessment and Plan: * Acute osteomyelitis of right foot [facility]) - The patient will be admitted to a medical-surgical bed. - Will continue antibiotic therapy with IV vancomycin  and Zosyn . - Pain management will be provided. - Will follow blood cultures as well as wound Gram stain culture and sensitivity. - Podiatry consult to be obtained. - [name] was notified and is aware about the patient.  Uncontrolled type 2 diabetes mellitus with hyperglycemia, with long-term current use of insulin  (HCC) - The  patient will be placed on supplemental coverage with NovoLog . - Will continue basal coverage. - Will hold off metformin . - Will continue Neurontin  for diabetic neuropathy.  Dyslipidemia - Will continue statin therapy.  Essential hypertension - Will continue antihypertensive therapy.   DVT prophylaxis: Lovenox .  Advanced Care Planning:  Code Status: full code.  Family Communication:  The plan of care was discussed in details with the patient (and family). I answered all questions. The patient agreed to proceed with the above mentioned plan. Further management will depend upon hospital course. Disposition Plan: Back to previous home environment Consults called: Podiatry All the records are reviewed and case discussed with ED provider.  Status is: Inpatient  At the time of the admission, it appears that the appropriate admission status for this patient is inpatient.  This is judged to be reasonable and necessary in order to provide the required intensity of service to ensure the patient's safety given the presenting symptoms, physical exam findings and initial radiographic and  laboratory data in the context of comorbid conditions.  The patient requires inpatient status due to high intensity of service, high risk of further deterioration and high frequency of surveillance required.  I certify that at the time of admission, it is my clinical judgment that the patient will require inpatient hospital care extending more than 2 midnights.                            Dispo: The patient is from: Home              Anticipated d/c is to: Home              Patient currently is not medically stable to d/c.              Difficult to place patient: No  Madison DELENA Peaches M.D on 06/27/2024 at 1:50 AM  [facility]   From 7 PM-7 AM, contact night-coverage [email or web address]  CC: Primary care physician; [name], MD

## 2024-06-26 NOTE — ED Notes (Signed)
 Rm 40 has an assigned bed

## 2024-06-26 NOTE — ED Notes (Signed)
 See triage note  Presents with pain to left foot  Had some toe amputated recently  Now having increased pain  States his wounds have not closed correctly

## 2024-06-26 NOTE — ED Triage Notes (Signed)
 Patient to ED via POV for right foot pain. PT reports having toes amputated on the 3rd and wounds not closing up. Pt reports drainage and swelling.

## 2024-06-26 NOTE — Progress Notes (Addendum)
 Pharmacy Antibiotic Note  [name] is a 58 y.o. male admitted on 06/26/2024 with cellulitis. Patient presenting with increased swelling and drainage to amputation site on right foot (s/p amputation earlier this month). PMH significant for HTN, HLD, T2DM, CKD III. Patient is afebrile with WBC 12.3. Pharmacy has been consulted for vancomycin  and Zosyn  dosing.  Plan: Start Zosyn  3.375 mg IV every 8 hours Give vancomycin  load of 2250 mg IV x1 F/u Scr in AM to determine need for 24-hour random level vs scheduled vancomycin  doses Monitor renal function, clinical status, culture data, and LOT  Height: 5' 10 (177.8 cm) Weight: 102.1 kg (225 lb) IBW/kg (Calculated) : 73  Temp (24hrs), Avg:98.3 F (36.8 C), Min:98.1 F (36.7 C), Max:98.4 F (36.9 C)  Recent Labs  Lab 06/26/24 1337 06/26/24 1729  WBC 12.3*  --   CREATININE 1.42*  --   LATICACIDVEN 1.6 0.9    Estimated Creatinine Clearance: 68.7 mL/min (A) (by C-G formula based on SCr of 1.42 mg/dL (H)).    No Known Allergies  Antimicrobials this admission: vancomycin  7/24 >>  Zosyn   7/24 >>   Dose adjustments this admission: N/A  Microbiology results: 7/24 BCx: pending 7/24 Wound cx: pending  Thank you for involving pharmacy in this patient's care.   Damien Napoleon, PharmD Clinical Pharmacist 06/26/2024 9:37 PM

## 2024-06-26 NOTE — ED Provider Notes (Signed)
 [facility] Provider Note    Event Date/Time   First MD Initiated Contact with Patient 06/26/24 1529     (approximate)   History   Foot Pain   HPI  [name] is a 58 y.o. male history of diabetes, insulin -dependent, hypertension, osteomyelitis presents emergency department with right foot pain.  Patient states he had toes amputated by Dr. Ashley.  The wound is open and has an odor.  A lot of drainage from the area.  Having increased pain in the area.  No fever or chills that he knows of.  States Dr. Ashley supposed to repair the open wound but the pain and drainage have him concerned about more infection.      Physical Exam   Triage Vital Signs: ED Triage Vitals  Encounter Vitals Group     BP 06/26/24 1337 122/81     Girls Systolic BP Percentile --      Girls Diastolic BP Percentile --      Boys Systolic BP Percentile --      Boys Diastolic BP Percentile --      Pulse Rate 06/26/24 1337 94     Resp 06/26/24 1337 17     Temp 06/26/24 1337 98.1 F (36.7 C)     Temp Source 06/26/24 1337 Oral     SpO2 06/26/24 1337 100 %     Weight 06/26/24 1335 225 lb (102.1 kg)     Height 06/26/24 1335 5' 10 (1.778 m)     Head Circumference --      Peak Flow --      Pain Score 06/26/24 1335 7     Pain Loc --      Pain Education --      Exclude from Growth Chart --     Most recent vital signs: Vitals:   06/26/24 1337  BP: 122/81  Pulse: 94  Resp: 17  Temp: 98.1 F (36.7 C)  SpO2: 100%     General: Awake, no distress.   CV:  Good peripheral perfusion.  Resp:  Normal effort.  Abd:  No distention.   Other:  Right foot with open wound and some drainage and odor noted, area is tender to palpation, see photo in chart   ED Results / Procedures / Treatments   Labs (all labs ordered are listed, but only abnormal results are displayed) Labs Reviewed  COMPREHENSIVE METABOLIC PANEL WITH GFR - Abnormal; Notable for the following components:       Result Value   Glucose, Bld 345 (*)    Creatinine, Ser 1.42 (*)    Calcium  8.7 (*)    Albumin 2.8 (*)    AST 11 (*)    GFR, Estimated 58 (*)    All other components within normal limits  CBC WITH DIFFERENTIAL/PLATELET - Abnormal; Notable for the following components:   WBC 12.3 (*)    RBC 3.58 (*)    Hemoglobin 8.7 (*)    HCT 29.2 (*)    MCH 24.3 (*)    MCHC 29.8 (*)    Platelets 493 (*)    Neutro Abs 9.8 (*)    All other components within normal limits  PROTIME-INR - Abnormal; Notable for the following components:   Prothrombin Time 15.4 (*)    All other components within normal limits  CULTURE, BLOOD (ROUTINE X 2)  CULTURE, BLOOD (ROUTINE X 2)  AEROBIC/ANAEROBIC CULTURE W GRAM STAIN (SURGICAL/DEEP WOUND)  LACTIC ACID, PLASMA  LACTIC ACID, PLASMA  EKG     RADIOLOGY MRI of the right foot with and without contrast    PROCEDURES:   Procedures  Critical Care:  no Chief Complaint  Patient presents with   Foot Pain      MEDICATIONS ORDERED IN ED: Medications  ciprofloxacin  (CIPRO ) IVPB 400 mg (has no administration in time range)  morphine  (PF) 4 MG/ML injection 4 mg (4 mg Intravenous Given 06/26/24 1620)  ondansetron  (ZOFRAN ) injection 4 mg (4 mg Intravenous Given 06/26/24 1620)  gadobutrol  (GADAVIST ) 1 MMOL/ML injection 10 mL (10 mLs Intravenous Contrast Given 06/26/24 1700)     IMPRESSION / MDM / ASSESSMENT AND PLAN / ED COURSE  I reviewed the triage vital signs and the nursing notes.                              Differential diagnosis includes, but is not limited to, wound infection, osteomyelitis, cellulitis  Patient's presentation is most consistent with acute presentation with potential threat to life or bodily function.   Medications given: Morphine  4 mg IV, Zofran  4 mg IV  Labs with elevated WBC of 12.3, glucose elevated at 345 which may be in his normal trend due to his diabetes.  However the with the white count being elevated neutrophils  also elevated do have concerns that the patient once again has osteomyelitis.  Do not feel he is septic as lactic acid is normal.  Did have a wound culture. MRI shows more osteomyelitis.  This was independently reviewed interpreted by me  Due to this new information of additional osteomyelitis I do feel it is important to consult podiatry.  [name] is on-call for Dr. Ashley hint.  He was able to evaluate the MRI, states it would benefit him to be on IV antibiotics admitted to the hospital.  Will most likely have to take him back to the OR with possible BKA.  States he will round on him in the morning.  Consult to hospitalist.  Dr. Lawence to admit the patient.  I did discuss everything with the patient.  He was agreeable for admission.  Started Cipro  due to questionable Pseudomonas.  Nursing staff instructed to get him a diabetic tray.  Hospitalist will adjust diet and treatment       FINAL CLINICAL IMPRESSION(S) / ED DIAGNOSES   Final diagnoses:  Wound infection  Acute osteomyelitis of right foot (HCC)     Rx / DC Orders   ED Discharge Orders     None        Note:  This document was prepared using Dragon voice recognition software and may include unintentional dictation errors.    [name], PA-C 06/26/24 1926    [name], MD 06/27/24 [phone]

## 2024-06-26 NOTE — ED Notes (Addendum)
   06/26/24 2013  Hand-off documentation  Hand-off Received Ready to receive patient from the ED  Report received from (Full Name) Chart Review   J Mansey contacted for orders for PT. R foot wound cleaned and dressed with gauze and Kerlix.

## 2024-06-26 NOTE — Progress Notes (Signed)
 PHARMACIST - PHYSICIAN COMMUNICATION  CONCERNING:  Enoxaparin  (Lovenox ) for DVT Prophylaxis    RECOMMENDATION: Patient was prescribed enoxaprin 40mg  q24 hours for VTE prophylaxis.   Filed Weights   06/26/24 1335  Weight: 102.1 kg (225 lb)    Body mass index is 32.28 kg/m.  Estimated Creatinine Clearance: 68.7 mL/min (A) (by C-G formula based on SCr of 1.42 mg/dL (H)).   Based on [facility] policy patient is candidate for enoxaparin  0.5mg /kg TBW SQ every 24 hours based on BMI being >30.  DESCRIPTION: Pharmacy has adjusted enoxaparin  dose per [facility] policy.  Patient is now receiving enoxaparin  50 mg every 24 hours    Damien Napoleon, PharmD Clinical Pharmacist  06/26/2024 9:18 PM

## 2024-06-27 DIAGNOSIS — E785 Hyperlipidemia, unspecified: Secondary | ICD-10-CM | POA: Insufficient documentation

## 2024-06-27 DIAGNOSIS — E1165 Type 2 diabetes mellitus with hyperglycemia: Secondary | ICD-10-CM

## 2024-06-27 DIAGNOSIS — I10 Essential (primary) hypertension: Secondary | ICD-10-CM | POA: Insufficient documentation

## 2024-06-27 DIAGNOSIS — M86171 Other acute osteomyelitis, right ankle and foot: Secondary | ICD-10-CM | POA: Diagnosis not present

## 2024-06-27 LAB — BLOOD CULTURE ID PANEL (REFLEXED) - BCID2

## 2024-06-27 LAB — BASIC METABOLIC PANEL WITH GFR
Anion gap: 8 (ref 5–15)
BUN: 17 mg/dL (ref 6–20)
CO2: 28 mmol/L (ref 22–32)
Calcium: 8.3 mg/dL — ABNORMAL LOW (ref 8.9–10.3)
Chloride: 101 mmol/L (ref 98–111)
Creatinine, Ser: 1.38 mg/dL — ABNORMAL HIGH (ref 0.61–1.24)
GFR, Estimated: 60 mL/min — ABNORMAL LOW (ref >60)
Glucose, Bld: 140 mg/dL — ABNORMAL HIGH (ref 70–99)
Potassium: 4.1 mmol/L (ref 3.5–5.1)
Sodium: 137 mmol/L (ref 135–145)

## 2024-06-27 LAB — CBC
HCT: 24.8 % — ABNORMAL LOW (ref 39.0–52.0)
Hemoglobin: 7.7 g/dL — ABNORMAL LOW (ref 13.0–17.0)
MCH: 25.1 pg — ABNORMAL LOW (ref 26.0–34.0)
MCHC: 31 g/dL (ref 30.0–36.0)
MCV: 80.8 fL (ref 80.0–100.0)
Platelets: 404 K/uL — ABNORMAL HIGH (ref 150–400)
RBC: 3.07 MIL/uL — ABNORMAL LOW (ref 4.22–5.81)
RDW: 14.8 % (ref 11.5–15.5)
WBC: 10 K/uL (ref 4.0–10.5)
nRBC: 0 % (ref 0.0–0.2)

## 2024-06-27 LAB — GLUCOSE, CAPILLARY
Glucose-Capillary: 156 mg/dL — ABNORMAL HIGH (ref 70–99)
Glucose-Capillary: 108 mg/dL — ABNORMAL HIGH (ref 70–99)
Glucose-Capillary: 102 mg/dL — ABNORMAL HIGH (ref 70–99)
Glucose-Capillary: 194 mg/dL — ABNORMAL HIGH (ref 70–99)
Glucose-Capillary: 198 mg/dL — ABNORMAL HIGH (ref 70–99)

## 2024-06-27 MED ORDER — INSULIN ASPART 100 UNIT/ML IJ SOLN
0.0000 [IU] | Freq: Three times a day (TID) | INTRAMUSCULAR | Status: DC
  Administered 2024-06-27 – 2024-06-28 (×3): 3 [IU] via SUBCUTANEOUS
  Administered 2024-06-28: 2 [IU] via SUBCUTANEOUS
  Administered 2024-06-30: 5 [IU] via SUBCUTANEOUS
  Administered 2024-06-30: 3 [IU] via SUBCUTANEOUS
  Administered 2024-06-30: 11 [IU] via SUBCUTANEOUS
  Administered 2024-07-01 (×2): 5 [IU] via SUBCUTANEOUS
  Administered 2024-07-02 (×2): 2 [IU] via SUBCUTANEOUS
  Administered 2024-07-03: 3 [IU] via SUBCUTANEOUS
  Administered 2024-07-03: 2 [IU] via SUBCUTANEOUS
  Administered 2024-07-03 – 2024-07-04 (×3): 3 [IU] via SUBCUTANEOUS
  Administered 2024-07-04: 2 [IU] via SUBCUTANEOUS
  Administered 2024-07-05 (×2): 3 [IU] via SUBCUTANEOUS
  Administered 2024-07-05: 2 [IU] via SUBCUTANEOUS
  Administered 2024-07-06: 5 [IU] via SUBCUTANEOUS
  Administered 2024-07-06: 2 [IU] via SUBCUTANEOUS
  Administered 2024-07-07: 3 [IU] via SUBCUTANEOUS
  Administered 2024-07-07: 2 [IU] via SUBCUTANEOUS
  Administered 2024-07-08: 3 [IU] via SUBCUTANEOUS
  Administered 2024-07-08 – 2024-07-09 (×4): 2 [IU] via SUBCUTANEOUS
  Administered 2024-07-10 (×2): 3 [IU] via SUBCUTANEOUS
  Administered 2024-07-11: 2 [IU] via SUBCUTANEOUS
  Administered 2024-07-13: 3 [IU] via SUBCUTANEOUS
  Administered 2024-07-13: 2 [IU] via SUBCUTANEOUS
  Administered 2024-07-14: 5 [IU] via SUBCUTANEOUS
  Administered 2024-07-14 (×2): 3 [IU] via SUBCUTANEOUS
  Administered 2024-07-14: 5 [IU] via SUBCUTANEOUS
  Filled 2024-06-27 (×32): qty 1

## 2024-06-27 MED ORDER — INSULIN ASPART 100 UNIT/ML IJ SOLN
0.0000 [IU] | INTRAMUSCULAR | Status: DC
  Administered 2024-06-27: 3 [IU] via SUBCUTANEOUS
  Filled 2024-06-27: qty 1

## 2024-06-27 MED ORDER — LOSARTAN POTASSIUM 25 MG PO TABS
25.0000 mg | ORAL_TABLET | Freq: Every day | ORAL | Status: DC
Start: 2024-06-28 — End: 2024-07-15
  Administered 2024-06-28 – 2024-07-14 (×17): 25 mg via ORAL
  Filled 2024-06-27 (×17): qty 1

## 2024-06-27 MED ORDER — LINEZOLID 600 MG PO TABS
600.0000 mg | ORAL_TABLET | Freq: Two times a day (BID) | ORAL | Status: DC
  Administered 2024-06-27 – 2024-06-28 (×2): 600 mg via ORAL
  Filled 2024-06-27 (×2): qty 1

## 2024-06-27 MED ORDER — INSULIN ASPART 100 UNIT/ML IJ SOLN
0.0000 [IU] | Freq: Every day | INTRAMUSCULAR | Status: DC
  Administered 2024-06-29: 5 [IU] via SUBCUTANEOUS
  Administered 2024-06-30: 2 [IU] via SUBCUTANEOUS
  Administered 2024-07-03: 3 [IU] via SUBCUTANEOUS
  Administered 2024-07-08: 2 [IU] via SUBCUTANEOUS
  Filled 2024-06-27 (×4): qty 1

## 2024-06-27 MED ORDER — SODIUM CHLORIDE 0.9 % IV SOLN
1.0000 g | Freq: Three times a day (TID) | INTRAVENOUS | Status: AC
  Administered 2024-06-27 – 2024-07-04 (×23): 1 g via INTRAVENOUS
  Filled 2024-06-27 (×23): qty 20

## 2024-06-27 NOTE — Progress Notes (Signed)
 Patient refused right foot dressing change today. Patient reported that he did the dressing undone.  Will continue to monitor.

## 2024-06-27 NOTE — Progress Notes (Signed)
 PHARMACY - PHYSICIAN COMMUNICATION CRITICAL VALUE ALERT - BLOOD CULTURE IDENTIFICATION (BCID)  [name] is an 58 y.o. male who presented to [facility] on 06/26/2024 with a chief complaint of acute onset right foot pain with drainage from a recent toe amputation wound of clear fluid.   Assessment: 1/4 bottles GPC; BCID detected MRSA  Name of physician (or Provider) Contacted: Dr. Caleen  Current antibiotics: Linezolid  & meropenem   Changes to prescribed antibiotics recommended:  Patient is on recommended antibiotics  Results for orders placed or performed during the hospital encounter of 06/26/24  Blood Culture ID Panel (Reflexed) (Collected: 06/26/2024  1:37 PM)  Result Value Ref Range   Enterococcus faecalis NOT DETECTED NOT DETECTED   Enterococcus Faecium NOT DETECTED NOT DETECTED   Listeria monocytogenes NOT DETECTED NOT DETECTED   Staphylococcus species DETECTED (A) NOT DETECTED   Staphylococcus aureus (BCID) DETECTED (A) NOT DETECTED   Staphylococcus epidermidis NOT DETECTED NOT DETECTED   Staphylococcus lugdunensis NOT DETECTED NOT DETECTED   Streptococcus species NOT DETECTED NOT DETECTED   Streptococcus agalactiae NOT DETECTED NOT DETECTED   Streptococcus pneumoniae NOT DETECTED NOT DETECTED   Streptococcus pyogenes NOT DETECTED NOT DETECTED   A.calcoaceticus-baumannii NOT DETECTED NOT DETECTED   Bacteroides fragilis NOT DETECTED NOT DETECTED   Enterobacterales NOT DETECTED NOT DETECTED   Enterobacter cloacae complex NOT DETECTED NOT DETECTED   Escherichia coli NOT DETECTED NOT DETECTED   Klebsiella aerogenes NOT DETECTED NOT DETECTED   Klebsiella oxytoca NOT DETECTED NOT DETECTED   Klebsiella pneumoniae NOT DETECTED NOT DETECTED   Proteus species NOT DETECTED NOT DETECTED   Salmonella species NOT DETECTED NOT DETECTED   Serratia marcescens NOT DETECTED NOT DETECTED   Haemophilus influenzae NOT DETECTED NOT DETECTED   Neisseria meningitidis NOT DETECTED NOT DETECTED    Pseudomonas aeruginosa NOT DETECTED NOT DETECTED   Stenotrophomonas maltophilia NOT DETECTED NOT DETECTED   Candida albicans NOT DETECTED NOT DETECTED   Candida auris NOT DETECTED NOT DETECTED   Candida glabrata NOT DETECTED NOT DETECTED   Candida krusei NOT DETECTED NOT DETECTED   Candida parapsilosis NOT DETECTED NOT DETECTED   Candida tropicalis NOT DETECTED NOT DETECTED   Cryptococcus neoformans/gattii NOT DETECTED NOT DETECTED   Meth resistant mecA/C and MREJ DETECTED (A) NOT DETECTED   Moises Terpstra 06/27/2024  4:31 PM

## 2024-06-27 NOTE — Assessment & Plan Note (Addendum)
 Patient with history of recent multiple ray amputation with podiatry and now with wound dehiscence.  MRI concerning for osteo of the remaining bone versus postprocedural edema. Repeat cultures with no organisms so far. Surrounding erythema and edema improving. - Antibiotic switched with meropenem  and linezolid  based on prior culture results and ID recommendations -Pending podiatry evaluation -Continue with supportive care

## 2024-06-27 NOTE — Assessment & Plan Note (Addendum)
 Blood pressure currently within goal -Continue home losartan 

## 2024-06-27 NOTE — Plan of Care (Signed)

## 2024-06-27 NOTE — Assessment & Plan Note (Signed)
 Will continue statin therapy

## 2024-06-27 NOTE — Assessment & Plan Note (Addendum)
-   The patient will be placed on supplemental coverage with NovoLog . - Will continue basal coverage. - Will hold off metformin. - Will continue Neurontin .

## 2024-06-27 NOTE — Inpatient Diabetes Management (Signed)
 Inpatient Diabetes Program Recommendations  AACE/ADA: New Consensus Statement on Inpatient Glycemic Control (2015)  Target Ranges:  Prepandial:   less than 140 mg/dL      Peak postprandial:   less than 180 mg/dL (1-2 hours)      Critically ill patients:  140 - 180 mg/dL   Lab Results  Component Value Date   GLUCAP 108 (H) 06/27/2024   HGBA1C 13.1 (H) 03/26/2024    Review of Glycemic Control  Latest Reference Range & Units 06/26/24 20:56 06/27/24 05:30 06/27/24 07:48  Glucose-Capillary 70 - 99 mg/dL 747 (H) 843 (H) 891 (H)   Diabetes history: DM 2 Outpatient Diabetes medications:  Novolog  0-15 units tid with meals, NPH 22 units bid Current orders for Inpatient glycemic control:  Novolog  0-15 units q 4 hours NPH 22 units bid  Inpatient Diabetes Program Recommendations:    Note patient NPO-  Consider reducing NPH to 12 units bid while NPO and reduce Novolog  correction to sensitive (0-9 units) q 4 hours.   Thanks,  Randall Bullocks, RN, BC-ADM Inpatient Diabetes Coordinator Pager [phone]  (8a-5p)

## 2024-06-27 NOTE — Assessment & Plan Note (Signed)
-   The patient will be placed on supplemental coverage with NovoLog . - Will continue basal coverage. - Will hold off metformin. - Will continue Neurontin .

## 2024-06-27 NOTE — Consult Note (Signed)
 PODIATRY / FOOT AND ANKLE SURGERY CONSULTATION NOTE  Requesting Physician: Dr. Lawence  Reason for consult: Foot wounds bilaterally   HPI: [name] is a 58 y.o. male who presents with a complicated history of wounds to both feet.  He presented to [facility] due to worsening concerns for right foot infection.  He went to the wound care center yesterday and was noted to have open bone exposed with concerns for infection of the right lower leg so he was sent to the emergency room.  He was subsequently admitted and podiatry team was consulted for further evaluation.  He has undergone a right transmetatarsal amputation revision with Dr. Ashley recently and had to 100% dehiscence of the incisional area.  Apparently with there was further discussion about revision once again.  Patient has been noncompliant putting weight on the foot and does have history of uncontrolled diabetes along with smoking.  He currently does not smoke per patient.  He presents today resting in bed comfortably.  He notes no pain to his feet at this time.  PMHx:  Past Medical History:  Diagnosis Date   Amputation of right great toe (HCC)    Cellulitis and abscess of foot    CKD stage 3a, GFR 45-59 ml/min (HCC)    Cocaine abuse (HCC)    + UDS on 05-12-24   DM (diabetes mellitus), type 2 (HCC)    ESBL (extended spectrum beta-lactamase) producing bacteria infection 04/18/2024   Hyperlipidemia    Hypertension    MRSA bacteremia    Normocytic anemia    Obesity    Tobacco abuse    Toe osteomyelitis, left (HCC)    Tuberculosis    tested positive,treated with medications.    Surgical Hx:  Past Surgical History:  Procedure Laterality Date   AMPUTATION TOE Right 03/26/2024   Procedure: AMPUTATION, TOE;  Surgeon: Harden Jerona GAILS, MD;  Location: [facility] OR;  Service: Orthopedics;  Laterality: Right;  RIGHT GREAT TOE AMPUTATION   BONE BIOPSY  04/18/2024   Procedure: BIOPSY, BONE (2ND METATARSAL);  Surgeon: Ashley Soulier, DPM;  Location: [facility] ORS;  Service: Orthopedics/Podiatry;;   FOOT SURGERY Left    I & D EXTREMITY Left 05/05/2022   Procedure: LEFT FOOT DEBRIDEMENT;  Surgeon: Harden Jerona GAILS, MD;  Location: [facility] OR;  Service: Orthopedics;  Laterality: Left;   INCISION AND DRAINAGE OF WOUND Right 06/05/2024   Procedure: DELAYED PRIMARY CLOSURE OF RIGHT FOOT WOUND, EXCISION OF DISTAL METATARSAL 2, 3, 4, and 5;  Surgeon: Ashley Soulier, DPM;  Location: [facility] ORS;  Service: Orthopedics/Podiatry;  Laterality: Right;   IRRIGATION AND DEBRIDEMENT FOOT Right 04/18/2024   Procedure: IRRIGATION AND DEBRIDEMENT FOOT, PLACEMENT OF ANTIBIOTIC BEADS;  Surgeon: Ashley Soulier, DPM;  Location: [facility] ORS;  Service: Orthopedics/Podiatry;  Laterality: Right;   MYRINGOTOMY WITH TUBE PLACEMENT Bilateral 07/20/2022   Procedure: MYRINGOTOMY WITH TUBE PLACEMENT;  Surgeon: Juengel, Paul, MD;  Location: [facility];  Service: ENT;  Laterality: Bilateral;  Diabetic   TONSILLECTOMY     TRANSMETATARSAL AMPUTATION Right 04/23/2024   Procedure: AMPUTATION, FOOT, TRANSMETATARSAL;  Surgeon: Ashley Soulier, DPM;  Location: [facility] ORS;  Service: Orthopedics/Podiatry;  Laterality: Right;    FHx:  Family History  Problem Relation Age of Onset   Colon cancer Neg Hx    Colon polyps Neg Hx    Crohn's disease Neg Hx    Esophageal cancer Neg Hx    Rectal cancer Neg Hx    Stomach cancer Neg Hx  Ulcerative colitis Neg Hx     Social History:  reports that he quit smoking about 21 months ago. His smoking use included cigarettes. He started smoking about 26 years ago. He has a 6.3 pack-year smoking history. He has never used smokeless tobacco. He reports that he does not drink alcohol and does not use drugs.  Allergies: No Known Allergies  Medications Prior to Admission  Medication Sig Dispense Refill   ascorbic acid  (VITAMIN C ) 500 MG tablet Take 1 tablet (500 mg total) by mouth daily. 30 tablet 2   bisacodyl  (DULCOLAX) 5 MG EC tablet  Take 2 tablets (10 mg total) by mouth at bedtime as needed for moderate constipation. 30 tablet 0   chlorhexidine  (HIBICLENS ) 4 % external liquid Apply 15 mLs (1 Application total) topically as directed for 30 doses. Use as directed daily for 5 days every other week for 6 weeks. 946 mL 1   cyanocobalamin  1000 MCG tablet Take 1 tablet (1,000 mcg total) by mouth daily. 90 tablet 0   gabapentin  (NEURONTIN ) 300 MG capsule Take 1 capsule (300 mg total) by mouth at bedtime. 30 capsule 3   insulin  aspart (NOVOLOG ) 100 UNIT/ML FlexPen Inject 0-15 Units into the skin 3 (three) times daily before meals.  Correction coverage: Moderate (average weight, post-op) CBG 70 - 120: 0 units CBG 121 - 150: 2 units CBG 151 - 200: 3 units CBG 201 - 250: 5 units CBG 251 - 300: 8 units CBG 301 - 350: 11 units CBG 351 - 400: 15 units CBG > 400: call MD 15 mL 11   Insulin  NPH, Human,, Isophane, (HUMULIN  N KWIKPEN) 100 UNIT/ML Kiwkpen Inject 22 Units into the skin 2 (two) times daily. 30 mL 5   iron  polysaccharides (NIFEREX) 150 MG capsule Take 1 capsule (150 mg total) by mouth daily. 90 capsule 0   losartan  (COZAAR ) 25 MG tablet Take 1 tablet (25 mg total) by mouth daily. (Patient taking differently: Take 25 mg by mouth every morning.) 30 tablet 11   metFORMIN  (GLUCOPHAGE -XR) 500 MG 24 hr tablet Take 2 tablets (1,000 mg total) by mouth 2 (two) times daily with a meal. 120 tablet 1   mupirocin  ointment (BACTROBAN ) 2 % Use as directed into the nose  2 times daily for 5 days every other week for 6 weeks. 60 g 0   oxyCODONE -acetaminophen  (PERCOCET) 5-325 MG tablet Take 1-2 tablets by mouth every 6 (six) hours as needed for severe pain (pain score 7-10). Max 6 tabs per day 30 tablet 0   polyethylene glycol powder (GLYCOLAX /MIRALAX ) 17 GM/SCOOP powder Take 17 g by mouth 2 (two) times daily. Mix as directed. (Patient taking differently: Take 17 g by mouth 2 (two) times daily as needed for mild constipation. Mix as directed.) 238  g 0   Vitamin D , Ergocalciferol , (DRISDOL ) 1.25 MG (50000 UNIT) CAPS capsule Take 1 capsule (50,000 Units total) by mouth every 7 (seven) days. (Patient taking differently: Take 50,000 Units by mouth every Tuesday.) 12 capsule 0   Accu-Chek Softclix Lancets lancets Use to check blood sugar 3 times daily. 100 each 6   Blood Glucose Monitoring Suppl (ACCU-CHEK GUIDE) w/Device KIT Use to check blood sugar 3 times daily. 1 kit 0   Continuous Blood Gluc Transmit (DEXCOM G6 TRANSMITTER) MISC Use to check blood sugar three times daily. Change transmitter once every 909 days. E11.69 (Patient not taking: Reported on 06/05/2024) 1 each 1   doxycycline  (VIBRA -TABS) 100 MG tablet Take 1 tablet (100 mg  total) by mouth 2 (two) times daily. (Patient not taking: Reported on 06/05/2024) 28 tablet 0   glucose blood (ACCU-CHEK GUIDE TEST) test strip Use to check blood sugar 3 times daily. 100 each 6   Insulin  Pen Needle 32G X 4 MM MISC Use as directed to inject insulin  up to 4 times daily. 100 each 0   methocarbamol  (ROBAXIN ) 750 MG tablet Take 1 tablet (750 mg total) by mouth every 6 (six) hours as needed for muscle spasms. (Patient not taking: Reported on 06/27/2024) 45 tablet 0   nitroGLYCERIN  (NITRODUR - DOSED IN MG/24 HR) 0.2 mg/hr patch Place 1 patch (0.2 mg total) onto the skin daily. 30 patch 12    Physical Exam: General: Alert and oriented.  No apparent distress.  Vascular: DP/PT pulses palpable bilateral.  Neuro: Light touch sensation absent to bilateral lower extremities.  Derm: Open ulceration present to the right transmetatarsal amputation site which appears to encompass about 80% of the entire incision.  Appears to have bone exposed through the area, mild edema and erythema present to this area but does not extend anywhere further than the incisional site, appears to have serosanguineous fluid with some purulent type discharge present to the area but appears stable.  Open ulceration present to the left  plantar foot near the 1st/2nd metatarsal phalangeal joints that measures approximately 2 x 2 cm, wound also present to the left fifth metatarsal head area which measured approximately 1 cm x 1 cm, wounds appear to have minimal depth with no signs of infection present, mild serous drainage present to the areas with surrounding hyperkeratotic tissue present.  MSK: Right transmetatarsal amputation  Results for orders placed or performed during the hospital encounter of 06/26/24 (from the past 48 hours)  Comprehensive metabolic panel     Status: Abnormal   Collection Time: 06/26/24  1:37 PM  Result Value Ref Range   Sodium 135 135 - 145 mmol/L   Potassium 5.0 3.5 - 5.1 mmol/L   Chloride 100 98 - 111 mmol/L   CO2 24 22 - 32 mmol/L   Glucose, Bld 345 (H) 70 - 99 mg/dL    Comment: Glucose reference range applies only to samples taken after fasting for at least 8 hours.   BUN 17 6 - 20 mg/dL   Creatinine, Ser 8.57 (H) 0.61 - 1.24 mg/dL   Calcium  8.7 (L) 8.9 - 10.3 mg/dL   Total Protein 7.5 6.5 - 8.1 g/dL   Albumin 2.8 (L) 3.5 - 5.0 g/dL   AST 11 (L) 15 - 41 U/L   ALT 9 0 - 44 U/L   Alkaline Phosphatase 88 38 - 126 U/L   Total Bilirubin 0.4 0.0 - 1.2 mg/dL   GFR, Estimated 58 (L) >60 mL/min    Comment: (NOTE) Calculated using the CKD-EPI Creatinine Equation (2021)    Anion gap 11 5 - 15    Comment: Performed at [facility], [address] [place], [place] [address]  Lactic acid, plasma     Status: None   Collection Time: 06/26/24  1:37 PM  Result Value Ref Range   Lactic Acid, Venous 1.6 0.5 - 1.9 mmol/L    Comment: Performed at [facility], [address] [place], [place] [address]  CBC with Differential     Status: Abnormal   Collection Time: 06/26/24  1:37 PM  Result Value Ref Range   WBC 12.3 (H) 4.0 - 10.5 K/uL   RBC 3.58 (L) 4.22 - 5.81 MIL/uL  Hemoglobin 8.7 (L) 13.0 - 17.0 g/dL   HCT 70.7 (L) 60.9 - 47.9 %   MCV 81.6 80.0 - 100.0 fL   MCH 24.3 (L)  26.0 - 34.0 pg   MCHC 29.8 (L) 30.0 - 36.0 g/dL   RDW 85.1 88.4 - 84.4 %   Platelets 493 (H) 150 - 400 K/uL   nRBC 0.0 0.0 - 0.2 %   Neutrophils Relative % 79 %   Neutro Abs 9.8 (H) 1.7 - 7.7 K/uL   Lymphocytes Relative 14 %   Lymphs Abs 1.7 0.7 - 4.0 K/uL   Monocytes Relative 5 %   Monocytes Absolute 0.6 0.1 - 1.0 K/uL   Eosinophils Relative 1 %   Eosinophils Absolute 0.1 0.0 - 0.5 K/uL   Basophils Relative 0 %   Basophils Absolute 0.0 0.0 - 0.1 K/uL   Immature Granulocytes 1 %   Abs Immature Granulocytes 0.07 0.00 - 0.07 K/uL    Comment: Performed at [facility], [address] [place], [place] [address]  Protime-INR     Status: Abnormal   Collection Time: 06/26/24  1:37 PM  Result Value Ref Range   Prothrombin Time 15.4 (H) 11.4 - 15.2 seconds   INR 1.2 0.8 - 1.2    Comment: (NOTE) INR goal varies based on device and disease states. Performed at [facility], [address] [place], [place] [address]   Culture, blood (Routine x 2)     Status: None (Preliminary result)   Collection Time: 06/26/24  1:37 PM   Specimen: BLOOD  Result Value Ref Range   Specimen Description BLOOD RIGHT ANTECUBITAL    Special Requests      BOTTLES DRAWN AEROBIC AND ANAEROBIC Blood Culture adequate volume   Culture  Setup Time      Organism ID to follow GRAM POSITIVE COCCI AEROBIC BOTTLE ONLY CRITICAL RESULT CALLED TO, READ BACK BY AND VERIFIED WITH: ERIN MEEGIN AT 1615 06/27/24.PMF Performed at [facility], [address] [place], [place] [address]    Culture GRAM POSITIVE COCCI    Report Status PENDING   Blood Culture ID Panel (Reflexed)     Status: Abnormal   Collection Time: 06/26/24  1:37 PM  Result Value Ref Range   Enterococcus faecalis NOT DETECTED NOT DETECTED   Enterococcus Faecium NOT DETECTED NOT DETECTED   Listeria monocytogenes NOT DETECTED NOT DETECTED   Staphylococcus species DETECTED (A) NOT DETECTED    Comment: CRITICAL RESULT CALLED  TO, READ BACK BY AND VERIFIED WITH: ERIN MEEGIN AT 1615 06/27/24.PMF    Staphylococcus aureus (BCID) DETECTED (A) NOT DETECTED    Comment: Methicillin (oxacillin)-resistant Staphylococcus aureus (MRSA). MRSA is predictably resistant to beta-lactam antibiotics (except ceftaroline). Preferred therapy is vancomycin  unless clinically contraindicated. Patient requires contact precautions if  hospitalized. CRITICAL RESULT CALLED TO, READ BACK BY AND VERIFIED WITH: ERIN MEEGIN AT 1615 06/27/24.PMF    Staphylococcus epidermidis NOT DETECTED NOT DETECTED   Staphylococcus lugdunensis NOT DETECTED NOT DETECTED   Streptococcus species NOT DETECTED NOT DETECTED   Streptococcus agalactiae NOT DETECTED NOT DETECTED   Streptococcus pneumoniae NOT DETECTED NOT DETECTED   Streptococcus pyogenes NOT DETECTED NOT DETECTED   A.calcoaceticus-baumannii NOT DETECTED NOT DETECTED   Bacteroides fragilis NOT DETECTED NOT DETECTED   Enterobacterales NOT DETECTED NOT DETECTED   Enterobacter cloacae complex NOT DETECTED NOT DETECTED   Escherichia coli NOT DETECTED NOT DETECTED   Klebsiella aerogenes NOT DETECTED NOT DETECTED   Klebsiella oxytoca NOT DETECTED NOT DETECTED  Klebsiella pneumoniae NOT DETECTED NOT DETECTED   Proteus species NOT DETECTED NOT DETECTED   Salmonella species NOT DETECTED NOT DETECTED   Serratia marcescens NOT DETECTED NOT DETECTED   Haemophilus influenzae NOT DETECTED NOT DETECTED   Neisseria meningitidis NOT DETECTED NOT DETECTED   Pseudomonas aeruginosa NOT DETECTED NOT DETECTED   Stenotrophomonas maltophilia NOT DETECTED NOT DETECTED   Candida albicans NOT DETECTED NOT DETECTED   Candida auris NOT DETECTED NOT DETECTED   Candida glabrata NOT DETECTED NOT DETECTED   Candida krusei NOT DETECTED NOT DETECTED   Candida parapsilosis NOT DETECTED NOT DETECTED   Candida tropicalis NOT DETECTED NOT DETECTED   Cryptococcus neoformans/gattii NOT DETECTED NOT DETECTED   Meth resistant mecA/C  and MREJ DETECTED (A) NOT DETECTED    Comment: CRITICAL RESULT CALLED TO, READ BACK BY AND VERIFIED WITH: ERIN MEEGIN AT 1615 06/27/24.PMF Performed at [facility], [address] [place], [place] [address]   Aerobic/Anaerobic Culture w Gram Stain (surgical/deep wound)     Status: None (Preliminary result)   Collection Time: 06/26/24  4:03 PM   Specimen: Foot  Result Value Ref Range   Specimen Description      FOOT Performed at [facility], [address], [place], [place] [address]    Special Requests      RIGHT FOOT Performed at [facility], [address] [place], [place] [address]    Gram Stain      RARE WBC PRESENT, PREDOMINANTLY PMN NO ORGANISMS SEEN    Culture      CULTURE REINCUBATED FOR BETTER GROWTH Performed at [facility], [address], [place], [place] [address]    Report Status PENDING   Lactic acid, plasma     Status: None   Collection Time: 06/26/24  5:29 PM  Result Value Ref Range   Lactic Acid, Venous 0.9 0.5 - 1.9 mmol/L    Comment: Performed at [facility], [address] [place], [place] [address]  Glucose, capillary     Status: Abnormal   Collection Time: 06/26/24  8:56 PM  Result Value Ref Range   Glucose-Capillary 252 (H) 70 - 99 mg/dL    Comment: Glucose reference range applies only to samples taken after fasting for at least 8 hours.  Culture, blood (Routine x 2)     Status: None (Preliminary result)   Collection Time: 06/26/24 10:46 PM   Specimen: BLOOD  Result Value Ref Range   Specimen Description BLOOD BLOOD RIGHT ARM    Special Requests      BOTTLES DRAWN AEROBIC AND ANAEROBIC Blood Culture adequate volume   Culture      NO GROWTH < 12 HOURS Performed at [facility], [address] [place], [place] [address]    Report Status PENDING   Basic metabolic panel     Status: Abnormal   Collection Time: 06/27/24  5:02 AM  Result Value Ref Range   Sodium 137 135 - 145 mmol/L    Potassium 4.1 3.5 - 5.1 mmol/L   Chloride 101 98 - 111 mmol/L   CO2 28 22 - 32 mmol/L   Glucose, Bld 140 (H) 70 - 99 mg/dL    Comment: Glucose reference range applies only to samples taken after fasting for at least 8 hours.   BUN 17 6 - 20 mg/dL   Creatinine, Ser 8.61 (H) 0.61 - 1.24 mg/dL   Calcium  8.3 (L) 8.9 - 10.3 mg/dL   GFR, Estimated 60 (L) >60  mL/min    Comment: (NOTE) Calculated using the CKD-EPI Creatinine Equation (2021)    Anion gap 8 5 - 15    Comment: Performed at [facility], [address] [place], [place] [address]  CBC     Status: Abnormal   Collection Time: 06/27/24  5:02 AM  Result Value Ref Range   WBC 10.0 4.0 - 10.5 K/uL   RBC 3.07 (L) 4.22 - 5.81 MIL/uL   Hemoglobin 7.7 (L) 13.0 - 17.0 g/dL   HCT 75.1 (L) 60.9 - 47.9 %   MCV 80.8 80.0 - 100.0 fL   MCH 25.1 (L) 26.0 - 34.0 pg   MCHC 31.0 30.0 - 36.0 g/dL   RDW 85.1 88.4 - 84.4 %   Platelets 404 (H) 150 - 400 K/uL   nRBC 0.0 0.0 - 0.2 %    Comment: Performed at [facility], [address] [place], [place] [address]  Glucose, capillary     Status: Abnormal   Collection Time: 06/27/24  5:30 AM  Result Value Ref Range   Glucose-Capillary 156 (H) 70 - 99 mg/dL    Comment: Glucose reference range applies only to samples taken after fasting for at least 8 hours.   Comment 1 Notify RN   Glucose, capillary     Status: Abnormal   Collection Time: 06/27/24  7:48 AM  Result Value Ref Range   Glucose-Capillary 108 (H) 70 - 99 mg/dL    Comment: Glucose reference range applies only to samples taken after fasting for at least 8 hours.  Glucose, capillary     Status: Abnormal   Collection Time: 06/27/24 11:56 AM  Result Value Ref Range   Glucose-Capillary 102 (H) 70 - 99 mg/dL    Comment: Glucose reference range applies only to samples taken after fasting for at least 8 hours.  Glucose, capillary     Status: Abnormal   Collection Time: 06/27/24  4:29 PM  Result Value Ref Range    Glucose-Capillary 194 (H) 70 - 99 mg/dL    Comment: Glucose reference range applies only to samples taken after fasting for at least 8 hours.   MR FOOT RIGHT W WO CONTRAST Result Date: 06/26/2024 CLINICAL DATA:  Right foot pain and wounds. Status post second through fifth mid transmetatarsal amputation on 06/05/2024. EXAM: MRI OF THE RIGHT FOREFOOT WITHOUT AND WITH CONTRAST TECHNIQUE: Multiplanar, multisequence MR imaging of the right forefoot was performed before and after the administration of intravenous contrast. CONTRAST:  10mL GADAVIST  GADOBUTROL  1 MMOL/ML IV SOLN COMPARISON:  MRI of the right foot dated 02/16/2024. Right foot radiographs dated 02/21/2024. FINDINGS: Bones/Joint/Cartilage Status post interval second through fifth ray amputation at the level of the mid metatarsal shafts. Associated soft tissue wound extending along the dorsal stump into the deep soft tissues overlying the second through fifth digit metatarsal transsection margins which demonstrate T2/STIR hyperintense marrow edema with corresponding T1 hypointensity. These findings are concerning for osteomyelitis, although, recent postsurgical change can demonstrate a similar appearance. Prior first ray amputation. Ligaments Lisfranc ligament is intact. Muscles and Tendons Diffuse nonspecific increased T2 signal of the intrinsic musculature of the foot, myositis cannot be excluded. Tenosynovitis of the posterior tibialis, flexor digitorum, and flexor hallucis longus tendons. Soft tissue Soft tissue wound with marked edema extending along the dorsal stump into the deep soft tissues overlying the second through fifth digit transmetatarsal transsection margins. No drainable abscess identified. IMPRESSION: 1. Status post interval second through fifth ray amputation at the level of the  mid metatarsal shafts. Associated soft tissue wound extending along the dorsal stump into the deep soft tissues overlying the second through fifth digit  metatarsal transsection margins which demonstrate T2/STIR hyperintense marrow edema with corresponding T1 hypointensity. These findings are concerning for osteomyelitis of the remnant second through fifth metatarsals, although, recent postsurgical change can demonstrate a similar appearance. Cellulitis cannot be excluded. No drainable abscess. 2. Diffuse nonspecific increased T2 signal of the intrinsic musculature of the foot, myositis cannot be excluded. 3. Tenosynovitis of the posterior tibialis, flexor digitorum, and flexor hallucis longus tendons. Electronically Signed   By: Harrietta Sherry M.D.   On: 06/26/2024 18:37    Blood pressure 111/71, pulse 82, temperature 98.4 F (36.9 C), temperature source Oral, resp. rate 16, height 5' 10 (1.778 m), weight 102.1 kg, SpO2 99%.  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  Unsure that patient has osteomyelitis as he has recently had bone debridement performed and could have reactive changes but bone is exposed through the amputation areas indicating likely infection could be present. - Discussed treatment options with patient.  Patient is at high risk for limb loss.  Discussed with patient that revision transmetatarsal on the right side amputation would likely be lasted could be performed.  If this does not heal then patient will likely go on to need a below-knee amputation.  Discussed chronic wounds also present to the left foot that have been unhealed for several years.  Discussed need for patient to stay off of his feet and do not put pressure on either feet at this time.  He may use his heels for transfers but should not be taking full steps through either foot.  Discussed paramount importance of staying  off his feet postoperatively if surgery is performed. -PT/OT ordered -Appreciate antibiotic recommendations per medicine.  Consider ID consult if infection present at time of amputation or thought that amputation did not cure infection. -All treatment options were discussed with the patient of both conservative and surgical attempts at correction including potential risks and complications.  Patient has elected for procedure consisting of revision right transmetatarsal amputation, left foot subcutaneous wound debridements with possible graft application.  No guarantees given.  Consent obtained.  No guarantees given. - Plan for surgery on Sunday, 06/29/2024 at around 7:30 in the morning, patient to be n.p.o. at midnight prior.  Prentice Homero, DPM 06/27/2024, 5:14 PM

## 2024-06-27 NOTE — Progress Notes (Signed)
 Progress Note   Patient: [name] FMW:[id number] DOB: 09-29-1966 DOA: 06/26/2024     1 DOS: the patient was seen and examined on 06/27/2024   Brief hospital course: Taken from H&P.  [name] is a 58 y.o. African-American male with medical history significant for hypertension, dyslipidemia, tobacco abuse, stage IIIa chronic kidney disease, cocaine abuse, and type 2 diabetes mellitus, who presented to the emergency room with acute onset of right foot pain with drainage from a recent toe amputation wound of clear fluid.  No fever or chills.   On presentation stable vital, labs with hyperglycemia at 345, creatinine 1.42, albumin 2.8, leukocytosis at 12.3 and hemoglobin of 8.7.  Wound and blood cultures were obtained.  MRI of the foot with following results 1. Status post interval second through fifth ray amputation at the level of the mid metatarsal shafts. Associated soft tissue wound extending along the dorsal stump into the deep soft tissues overlying the second through fifth digit metatarsal transsection margins which demonstrate T2/STIR hyperintense marrow edema with corresponding T1 hypointensity. These findings are concerning for osteomyelitis of the remnant second through fifth metatarsals, although, recent postsurgical change can demonstrate a similar appearance. Cellulitis cannot be excluded. No drainable abscess. 2. Diffuse nonspecific increased T2 signal of the intrinsic musculature of the foot, myositis cannot be excluded. 3. Tenosynovitis of the posterior tibialis, flexor digitorum, and flexor hallucis longus tendons.  Podiatry was consulted and patient was started on broad-spectrum antibiotics with Zosyn  and vancomycin .  7/25: Vital stable, leukocytosis resolved, hemoglobin decreased to 7.7-all cell line decreased, creatinine with some improvement to 1.38, preliminary blood cultures negative in 12-hour, preliminary wound cultures with no organisms seen. Antibiotics switched  with meropenem  and linezolid  based on prior culture results and ID recommendations. Pending podiatry evaluation.  Assessment and Plan: * Acute osteomyelitis of right foot [name]) Patient with history of recent multiple ray amputation with podiatry and now with wound dehiscence.  MRI concerning for osteo of the remaining bone versus postprocedural edema. Repeat cultures with no organisms so far. Surrounding erythema and edema improving. - Antibiotic switched with meropenem  and linezolid  based on prior culture results and ID recommendations -Pending podiatry evaluation -Continue with supportive care  Uncontrolled type 2 diabetes mellitus with hyperglycemia, with long-term current use of insulin  (HCC) - Continue with SSI - Will continue basal coverage. - Will hold off metformin . - Will continue Neurontin  for diabetic neuropathy.  Dyslipidemia - Will continue statin therapy.  Essential hypertension Blood pressure currently within goal -Continue home losartan    Subjective: Patient was seen and examined today.  Right leg edema and pain improving.  Still having some oozing from the wound.  Physical Exam: Vitals:   06/26/24 1337 06/26/24 2040 06/27/24 0447 06/27/24 0744  BP: 122/81 137/77 110/71 118/62  Pulse: 94 87 80 78  Resp: 17 18 18 17   Temp: 98.1 F (36.7 C) 98.4 F (36.9 C) 97.6 F (36.4 C) 98.4 F (36.9 C)  TempSrc: Oral  Oral Oral  SpO2: 100% 100% 100% 97%  Weight:      Height:       General.  Obese gentleman, in no acute distress. Pulmonary.  Lungs clear bilaterally, normal respiratory effort. CV.  Regular rate and rhythm, no JVD, rub or murmur. Abdomen.  Soft, nontender, nondistended, BS positive. CNS.  Alert and oriented .  No focal neurologic deficit. Extremities.  No LE edema, right foot with bandage. Psychiatry.  Judgment and insight appears normal.   Data Reviewed: Prior data reviewed  Family  Communication: Discussed with patient  Disposition: Status  is: Inpatient Remains inpatient appropriate because: Severity of illness  Planned Discharge Destination: Home  DVT prophylaxis.  Lovenox  Time spent: 50 minutes  This record has been created using Conservation officer, historic buildings. Errors have been sought and corrected,but may not always be located. Such creation errors do not reflect on the standard of care.   Author: Amaryllis Dare, MD 06/27/2024 1:51 PM  For on call review [email or web address].

## 2024-06-27 NOTE — Assessment & Plan Note (Addendum)
-   Continue with SSI - Will continue basal coverage. - Will hold off metformin . - Will continue Neurontin  for diabetic neuropathy.

## 2024-06-27 NOTE — Plan of Care (Signed)
  Problem: Clinical Measurements: Goal: Diagnostic test results will improve Outcome: Progressing   Problem: Nutrition: Goal: Adequate nutrition will be maintained Outcome: Progressing   Problem: Coping: Goal: Level of anxiety will decrease Outcome: Progressing

## 2024-06-27 NOTE — Hospital Course (Addendum)
 Taken from H&P.  [name] is a 58 y.o. African-American male with medical history significant for hypertension, dyslipidemia, tobacco abuse, stage IIIa chronic kidney disease, cocaine abuse, and type 2 diabetes mellitus, who presented to the emergency room with acute onset of right foot pain with drainage from a recent toe amputation wound of clear fluid.  No fever or chills.   On presentation stable vital, labs with hyperglycemia at 345, creatinine 1.42, albumin 2.8, leukocytosis at 12.3 and hemoglobin of 8.7.  Wound and blood cultures were obtained.  MRI of the foot with following results 1. Status post interval second through fifth ray amputation at the level of the mid metatarsal shafts. Associated soft tissue wound extending along the dorsal stump into the deep soft tissues overlying the second through fifth digit metatarsal transsection margins which demonstrate T2/STIR hyperintense marrow edema with corresponding T1 hypointensity. These findings are concerning for osteomyelitis of the remnant second through fifth metatarsals, although, recent postsurgical change can demonstrate a similar appearance. Cellulitis cannot be excluded. No drainable abscess. 2. Diffuse nonspecific increased T2 signal of the intrinsic musculature of the foot, myositis cannot be excluded. 3. Tenosynovitis of the posterior tibialis, flexor digitorum, and flexor hallucis longus tendons.  Podiatry was consulted and patient was started on broad-spectrum antibiotics with Zosyn  and vancomycin .  7/25: Vital stable, leukocytosis resolved, hemoglobin decreased to 7.7-all cell line decreased, creatinine with some improvement to 1.38, preliminary blood cultures negative in 12-hour, preliminary wound cultures with no organisms seen. Antibiotics switched with meropenem  and linezolid  based on prior culture results and ID recommendations. Pending podiatry evaluation.  7/26: Vital stable 1 out of 4 blood culture bottles with  MRSA, podiatry is planning TMA and debridement of left sided superficial ulcers on Sunday, 7/27.  Echocardiogram ordered. Improving renal function with stable hemoglobin at 7.7, recent anemia panel with some iron  deficiency and borderline B12 and folate, starting on supplement.  7/27: Vital stable, wound cultures growing Staph aureus, diphtheroid, rare Klebsiella pneumonia and rare procidentia. 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  Echocardiogram with no evidence of valvular disease on TTE, ID to decide whether need TEE or not at this time.  7/28: Hemodynamically stable, CBG elevated so increasing the dose of Semglee  and adding mealtime coverage.  Patient need TEE per ID-cardiology was consulted.  Patient to remain nonweightbearing on both extremities except heels. Do not remove left foot dressing due to skin graft per podiatry. They change right foot dressing today. Pending PT evaluation-likely will need SNF as he lives alone  7/29: Remained hemodynamically stable, PT is recommending SNF.  TEE got canceled due to some scheduling issues and will be done tomorrow morning

## 2024-06-28 ENCOUNTER — Inpatient Hospital Stay
Admit: 2024-06-28 | Discharge: 2024-06-28 | Disposition: A | Discharge status: Home / Self Care | Attending: Internal Medicine

## 2024-06-28 DIAGNOSIS — M86171 Other acute osteomyelitis, right ankle and foot: Secondary | ICD-10-CM | POA: Diagnosis not present

## 2024-06-28 DIAGNOSIS — I10 Essential (primary) hypertension: Secondary | ICD-10-CM | POA: Diagnosis not present

## 2024-06-28 DIAGNOSIS — E1165 Type 2 diabetes mellitus with hyperglycemia: Secondary | ICD-10-CM | POA: Diagnosis not present

## 2024-06-28 DIAGNOSIS — E785 Hyperlipidemia, unspecified: Secondary | ICD-10-CM | POA: Diagnosis not present

## 2024-06-28 LAB — CK: Total CK: 75 U/L (ref 49–397)

## 2024-06-28 LAB — GLUCOSE, CAPILLARY
Glucose-Capillary: 195 mg/dL — ABNORMAL HIGH (ref 70–99)
Glucose-Capillary: 179 mg/dL — ABNORMAL HIGH (ref 70–99)
Glucose-Capillary: 132 mg/dL — ABNORMAL HIGH (ref 70–99)
Glucose-Capillary: 189 mg/dL — ABNORMAL HIGH (ref 70–99)

## 2024-06-28 LAB — ECHOCARDIOGRAM COMPLETE
AR max vel: 3.32 cm2
AV Peak grad: 10.9 mmHg
Ao pk vel: 1.65 m/s
Area-P 1/2: 4.01 cm2
Height: 70 in
S' Lateral: 3 cm
Weight: 3600 [oz_av]

## 2024-06-28 LAB — CBC
HCT: 25.3 % — ABNORMAL LOW (ref 39.0–52.0)
Hemoglobin: 7.7 g/dL — ABNORMAL LOW (ref 13.0–17.0)
MCH: 24.7 pg — ABNORMAL LOW (ref 26.0–34.0)
MCHC: 30.4 g/dL (ref 30.0–36.0)
MCV: 81.1 fL (ref 80.0–100.0)
Platelets: 437 K/uL — ABNORMAL HIGH (ref 150–400)
RBC: 3.12 MIL/uL — ABNORMAL LOW (ref 4.22–5.81)
RDW: 14.9 % (ref 11.5–15.5)
WBC: 8.4 K/uL (ref 4.0–10.5)
nRBC: 0 % (ref 0.0–0.2)

## 2024-06-28 LAB — BASIC METABOLIC PANEL WITH GFR
Anion gap: 5 (ref 5–15)
BUN: 16 mg/dL (ref 6–20)
CO2: 27 mmol/L (ref 22–32)
Calcium: 8.2 mg/dL — ABNORMAL LOW (ref 8.9–10.3)
Chloride: 102 mmol/L (ref 98–111)
Creatinine, Ser: 1.27 mg/dL — ABNORMAL HIGH (ref 0.61–1.24)
GFR, Estimated: >60 mL/min (ref >60)
Glucose, Bld: 159 mg/dL — ABNORMAL HIGH (ref 70–99)
Potassium: 4.3 mmol/L (ref 3.5–5.1)
Sodium: 134 mmol/L — ABNORMAL LOW (ref 135–145)

## 2024-06-28 MED ORDER — FOLIC ACID 1 MG PO TABS
1.0000 mg | ORAL_TABLET | Freq: Every day | ORAL | Status: DC
  Administered 2024-06-28 – 2024-07-14 (×17): 1 mg via ORAL
  Filled 2024-06-28 (×17): qty 1

## 2024-06-28 MED ORDER — FE FUM-VIT C-VIT B12-FA 460-60-0.01-1 MG PO CAPS: 1.0000 | ORAL_CAPSULE | Freq: Two times a day (BID) | ORAL | Status: DC

## 2024-06-28 MED ORDER — OXYCODONE-ACETAMINOPHEN 5-325 MG PO TABS
1.0000 | ORAL_TABLET | ORAL | Status: DC | PRN
  Administered 2024-06-28 – 2024-07-02 (×17): 2 via ORAL
  Administered 2024-07-02: 1 via ORAL
  Administered 2024-07-03 – 2024-07-06 (×14): 2 via ORAL
  Administered 2024-07-07 (×2): 1 via ORAL
  Administered 2024-07-07 – 2024-07-14 (×25): 2 via ORAL
  Filled 2024-06-28 (×5): qty 2
  Filled 2024-06-28: qty 1
  Filled 2024-06-28 (×21): qty 2
  Filled 2024-06-28: qty 1
  Filled 2024-06-28 (×2): qty 2
  Filled 2024-06-28: qty 1
  Filled 2024-06-28 (×6): qty 2
  Filled 2024-06-28: qty 1
  Filled 2024-06-28 (×20): qty 2

## 2024-06-28 MED ORDER — SODIUM CHLORIDE 0.9 % IV SOLN
8.0000 mg/kg | Freq: Every day | INTRAVENOUS | Status: DC
  Administered 2024-06-28 – 2024-07-14 (×18): 800 mg via INTRAVENOUS
  Filled 2024-06-28 (×12): qty 16
  Filled 2024-06-28: qty 10
  Filled 2024-06-28 (×4): qty 16

## 2024-06-28 MED ORDER — GABAPENTIN 300 MG PO CAPS
300.0000 mg | ORAL_CAPSULE | Freq: Two times a day (BID) | ORAL | Status: DC
  Administered 2024-06-28 – 2024-07-14 (×32): 300 mg via ORAL
  Filled 2024-06-28 (×32): qty 1

## 2024-06-28 NOTE — Evaluation (Signed)
 Physical Therapy Evaluation Patient Details Name: [name] MRN: [id number] DOB: July 20, 1966 Today's Date: 06/28/2024  History of Present Illness  Pt admitted for acute osteo of R foot. HIstory of multiple surgeries on B feet and pending re-do of trans met amputation tomorrow. HIstory includes CKD, cocaine, ESBL, HLD, and HTN.  Clinical Impression  Pt is a pleasant 58 year old male who was admitted for acute osteo in R foot and is pending further surgery tomorrow. Pt requesting PT consult as he was initially non compliant with Wbing at home as he lives alone/independently. He is requesting to go to SNF for recovery. Pt performs bed mobility/transfers with independence. Education provided with new restricted Wbing of which he is able to follow and agreeable. Pt has very limited support at home, recommended to be WC level at this time for all mobility. Pt demonstrates all bed mobility/transfers/ambulation at baseline level. Pt does not require any further PT needs at this time. Pt will be dc in house and does not require follow up. RN aware. Will dc current orders.         If plan is discharge home, recommend the following: Assistance with cooking/housework;Assist for transportation   Can travel by private vehicle        Equipment Recommendations None recommended by PT  Recommendations for Other Services       Functional Status Assessment Patient has not had a recent decline in their functional status     Precautions / Restrictions Precautions Precautions: Fall Recall of Precautions/Restrictions: Intact Restrictions Weight Bearing Restrictions Per Provider Order: Yes RLE Weight Bearing Per Provider Order: Non weight bearing LLE Weight Bearing Per Provider Order: Non weight bearing Other Position/Activity Restrictions: per podiatrist is allowed heel wb'ing for transfers only      Mobility  Bed Mobility               General bed mobility comments: NT, received in recliner     Transfers Overall transfer level: Independent Equipment used: None               General transfer comment: able to transfer chair<>bed with safe technique. Cues for Heel WBing.    Ambulation/Gait               General Gait Details: not performed due to restrictions  Stairs            Wheelchair Mobility     Tilt Bed    Modified Rankin (Stroke Patients Only)       Balance Overall balance assessment: Independent                                           Pertinent Vitals/Pain Pain Assessment Pain Assessment: No/denies pain    Home Living Family/patient expects to be discharged to:: Private residence Living Arrangements: Alone Available Help at Discharge: Family Type of Home: Apartment Home Access: Level entry       Home Layout: One level Home Equipment: Shower seat;Grab bars - Building services engineer (2 wheels);Wheelchair - manual Additional Comments: was non compliant with WBing at home, therefore wounds dihissed and didn't heal    Prior Function Prior Level of Function : Independent/Modified Independent             Mobility Comments: reports no falls. was using AD ADLs Comments: indep     Extremity/Trunk Assessment   Upper Extremity Assessment Upper  Extremity Assessment: Overall WFL for tasks assessed    Lower Extremity Assessment Lower Extremity Assessment: Overall WFL for tasks assessed       Communication   Communication Communication: No apparent difficulties    Cognition Arousal: Alert Behavior During Therapy: WFL for tasks assessed/performed   PT - Cognitive impairments: No apparent impairments                       PT - Cognition Comments: pleasant and agreeable to session Following commands: Intact       Cueing Cueing Techniques: Verbal cues     General Comments      Exercises     Assessment/Plan    PT Assessment Patient does not need any further PT services   PT Problem List         PT Treatment Interventions      PT Goals (Current goals can be found in the Care Plan section)  Acute Rehab PT Goals Patient Stated Goal: to go somewhere while I recover PT Goal Formulation: All assessment and education complete, DC therapy Time For Goal Achievement: 06/28/24 Potential to Achieve Goals: Good    Frequency       Co-evaluation               AM-PAC PT 6 Clicks Mobility  Outcome Measure Help needed turning from your back to your side while in a flat bed without using bedrails?: None Help needed moving from lying on your back to sitting on the side of a flat bed without using bedrails?: None Help needed moving to and from a bed to a chair (including a wheelchair)?: None Help needed standing up from a chair using your arms (e.g., wheelchair or bedside chair)?: None Help needed to walk in hospital room?: A Lot Help needed climbing 3-5 steps with a railing? : A Lot 6 Click Score: 20    End of Session   Activity Tolerance: Patient tolerated treatment well Patient left: in chair Nurse Communication: Mobility status PT Visit Diagnosis: Difficulty in walking, not elsewhere classified (R26.2)    Time: 9040-8989 PT Time Calculation (min) (ACUTE ONLY): 11 min   Charges:   PT Evaluation $PT Eval Low Complexity: 1 Low   PT General Charges $$ ACUTE PT VISIT: 1 Visit         Corean Dade, PT, DPT, GCS [phone]   Lucion Dilger 06/28/2024, 1:26 PM

## 2024-06-28 NOTE — Assessment & Plan Note (Addendum)
 Patient with history of recent multiple ray amputation with podiatry and now with wound dehiscence.  MRI concerning for osteo of the remaining bone versus postprocedural edema. Repeat wound cultures with no organisms so far. 1/4 blood culture bottles with MRSA Surrounding erythema and edema improving. - Continue with meropenem  -Linezolid  is being switched with IV daptomycin  by ID -Podiatry is taking him to the OR for TMA tomorrow -Continue with supportive care

## 2024-06-28 NOTE — Progress Notes (Signed)
 OT Cancellation Note  Patient Details Name: [name] MRN: [id number] DOB: 14-Nov-1966   Cancelled Treatment:     Pt will be having surgery on foot tomorrow again - will recommend OT to eval and tx after next surgery to get true baseline and tx plan with discharge recommendations - need new OT eval and tx orders  Ancel Peters OTR/L,CLT 06/28/2024, 11:54 AM

## 2024-06-28 NOTE — Assessment & Plan Note (Signed)
 CBG mostly within goal now, with 1 episode of mild hypoglycemia but patient was n.p.o. - Continue with SSI - Will continue basal coverage-increasing the dose of semglee  - Continue with mealtime coverage of 5 units - Will hold off metformin . - Will continue Neurontin  for diabetic neuropathy.

## 2024-06-28 NOTE — Assessment & Plan Note (Signed)
 1 out of 4 blood culture bottles with MRSA bacteremia -Linezolid  switched with IV daptomycin  by ID -Echocardiogram ordered

## 2024-06-28 NOTE — Progress Notes (Signed)
 ID MRSA bacteremia (vigilanz alert) Diabetic foot infection Rt TMA site dehiscence - MRSA bacteremia 1/4  in may 2025 when he had the infected rt foot and underwent TMA on 04/23/24 ( margins clear of osteo) . Culture MRSA and bunch of gram neg rods ESBL organism wa spresent treated with appropriate IV antibiotics  IV daptomycin  and Iv meropenem2 weeks followed by 2 more weeks of PO.antibiotics.  7/3 had delayed cloure and excision of distal met 2/3/4/- no path or culture at that time Saw Podiatrist as f/u on 7/15- found to be okay Presented on 7/24 with acute onset of rt foot pain with drianage     06/26/24 13:37  BP 122/81  Temp 98.1 F (36.7 C)  Pulse Rate 94  Resp 17  SpO2 100 %    Latest Reference Range & Units 06/26/24 13:37  WBC 4.0 - 10.5 K/uL 12.3 (H)  Hemoglobin 13.0 - 17.0 g/dL 8.7 (L)  HCT 60.9 - 47.9 % 29.2 (L)  Platelets 150 - 400 K/uL 493 (H)  Creatinine 0.61 - 1.24 mg/dL 8.57 (H)   Blood culture positive 1 set MRSA 2nd set neg MRI- post surgical changes VS remnant ostoe Currently on meropenem  and linezolid - change the latter to daptomycin  Dr.Baker planning for surgery tomorrow Discussed with patient and care team

## 2024-06-28 NOTE — Progress Notes (Signed)
 Progress Note   Patient: [name] FMW:[id number] DOB: 1965/12/15 DOA: 06/26/2024     2 DOS: the patient was seen and examined on 06/28/2024   Brief hospital course: Taken from H&P.  [name] is a 58 y.o. African-American male with medical history significant for hypertension, dyslipidemia, tobacco abuse, stage IIIa chronic kidney disease, cocaine abuse, and type 2 diabetes mellitus, who presented to the emergency room with acute onset of right foot pain with drainage from a recent toe amputation wound of clear fluid.  No fever or chills.   On presentation stable vital, labs with hyperglycemia at 345, creatinine 1.42, albumin 2.8, leukocytosis at 12.3 and hemoglobin of 8.7.  Wound and blood cultures were obtained.  MRI of the foot with following results 1. Status post interval second through fifth ray amputation at the level of the mid metatarsal shafts. Associated soft tissue wound extending along the dorsal stump into the deep soft tissues overlying the second through fifth digit metatarsal transsection margins which demonstrate T2/STIR hyperintense marrow edema with corresponding T1 hypointensity. These findings are concerning for osteomyelitis of the remnant second through fifth metatarsals, although, recent postsurgical change can demonstrate a similar appearance. Cellulitis cannot be excluded. No drainable abscess. 2. Diffuse nonspecific increased T2 signal of the intrinsic musculature of the foot, myositis cannot be excluded. 3. Tenosynovitis of the posterior tibialis, flexor digitorum, and flexor hallucis longus tendons.  Podiatry was consulted and patient was started on broad-spectrum antibiotics with Zosyn  and vancomycin .  7/25: Vital stable, leukocytosis resolved, hemoglobin decreased to 7.7-all cell line decreased, creatinine with some improvement to 1.38, preliminary blood cultures negative in 12-hour, preliminary wound cultures with no organisms seen. Antibiotics switched  with meropenem  and linezolid  based on prior culture results and ID recommendations. Pending podiatry evaluation.  7/26: Vital stable 1 out of 4 blood culture bottles with MRSA, podiatry is planning TMA and debridement of left sided superficial ulcers on Sunday, 7/27.  Echocardiogram ordered. Improving renal function with stable hemoglobin at 7.7, recent anemia panel with some iron  deficiency and borderline B12 and folate, starting on supplement.  Assessment and Plan: * Acute osteomyelitis of right foot [facility]) Patient with history of recent multiple ray amputation with podiatry and now with wound dehiscence.  MRI concerning for osteo of the remaining bone versus postprocedural edema. Repeat wound cultures with no organisms so far. 1/4 blood culture bottles with MRSA Surrounding erythema and edema improving. - Continue with meropenem  -Linezolid  is being switched with IV daptomycin  by ID -Podiatry is taking him to the OR for TMA tomorrow -Continue with supportive care  Uncontrolled type 2 diabetes mellitus with hyperglycemia, with long-term current use of insulin  (HCC) - Continue with SSI - Will continue basal coverage. - Will hold off metformin . - Will continue Neurontin  for diabetic neuropathy.  MRSA bacteremia 1 out of 4 blood culture bottles with MRSA bacteremia -Linezolid  switched with IV daptomycin  by ID -Echocardiogram ordered  Dyslipidemia - Will continue statin therapy.  Essential hypertension Blood pressure currently within goal -Continue home losartan    Subjective: Patient continued to have intermittent sharp pain at right foot stump.  Continued to have some drainage.  Physical Exam: Vitals:   06/27/24 1508 06/27/24 1959 06/28/24 0450 06/28/24 0730  BP: 111/71 131/74 110/69 118/64  Pulse: 82 84 86 86  Resp: 16 18 20 16   Temp: 98.4 F (36.9 C) 98.3 F (36.8 C) 99.3 F (37.4 C) 98.4 F (36.9 C)  TempSrc: Oral  Oral   SpO2: 99% 100% 95% 97%  Weight:       Height:       General.  Obese gentleman, in no acute distress. Pulmonary.  Lungs clear bilaterally, normal respiratory effort. CV.  Regular rate and rhythm, no JVD, rub or murmur. Abdomen.  Soft, nontender, nondistended, BS positive. CNS.  Alert and oriented .  No focal neurologic deficit. Extremities.  Trace right LE edema, no erythema, right foot with bandage, palpable pulses bilaterally Psychiatry.  Judgment and insight appears normal.    Data Reviewed: Prior data reviewed  Family Communication: Discussed with patient  Disposition: Status is: Inpatient Remains inpatient appropriate because: Severity of illness  Planned Discharge Destination: Home  DVT prophylaxis.  Lovenox  Time spent: 50 minutes  This record has been created using Conservation officer, historic buildings. Errors have been sought and corrected,but may not always be located. Such creation errors do not reflect on the standard of care.   Author: Amaryllis Dare, MD 06/28/2024 3:05 PM  For on call review [email or web address].

## 2024-06-28 NOTE — Plan of Care (Signed)

## 2024-06-28 NOTE — Consult Note (Signed)
 Pharmacy Antibiotic Note  [name] is a 58 y.o. male admitted on 06/26/2024 with bacteremia.  Pharmacy has been consulted for Daptomycin  dosing. Baseline CK obtained. Linezolid  discontinued.  Plan: Daptomycin  8mg /kg (800mg ) IV daily.  Height: 5' 10 (177.8 cm) Weight: 102.1 kg (225 lb) IBW/kg (Calculated) : 73  Temp (24hrs), Avg:98.6 F (37 C), Min:98.3 F (36.8 C), Max:99.3 F (37.4 C)  Recent Labs  Lab 06/26/24 1337 06/26/24 1729 06/27/24 0502 06/28/24 0536  WBC 12.3*  --  10.0 8.4  CREATININE 1.42*  --  1.38* 1.27*  LATICACIDVEN 1.6 0.9  --   --     Estimated Creatinine Clearance: 76.8 mL/min (A) (by C-G formula based on SCr of 1.27 mg/dL (H)).    No Known Allergies  Antimicrobials this admission: Ciprofloxacin  7/24 >>  Vancomycin  7/24 >> Linezolid   7/25 >>  Zosyn  7/25 >> Meropenem  7/25 >>  Dose adjustments this admission: n/a  Microbiology results: 7/24 BCx: Staph aureus   Alexanderia Gorby Rodriguez-Guzman PharmD, BCPS 06/28/2024 11:36 AM

## 2024-06-28 NOTE — Progress Notes (Signed)
  Echocardiogram 2D Echocardiogram has been performed.  [name] 06/28/2024, 3:15 PM

## 2024-06-29 ENCOUNTER — Inpatient Hospital Stay: Admitting: Certified Registered"

## 2024-06-29 ENCOUNTER — Other Ambulatory Visit: Payer: Self-pay

## 2024-06-29 ENCOUNTER — Inpatient Hospital Stay

## 2024-06-29 ENCOUNTER — Encounter
Admission: EM | Disposition: A | Discharge status: Skilled Nursing Facility | Payer: Self-pay | Source: Home / Self Care | Attending: Internal Medicine

## 2024-06-29 DIAGNOSIS — B961 Klebsiella pneumoniae [K. pneumoniae] as the cause of diseases classified elsewhere: Secondary | ICD-10-CM

## 2024-06-29 DIAGNOSIS — L089 Local infection of the skin and subcutaneous tissue, unspecified: Secondary | ICD-10-CM

## 2024-06-29 DIAGNOSIS — Z1612 Extended spectrum beta lactamase (ESBL) resistance: Secondary | ICD-10-CM | POA: Diagnosis not present

## 2024-06-29 DIAGNOSIS — R7881 Bacteremia: Secondary | ICD-10-CM | POA: Diagnosis not present

## 2024-06-29 DIAGNOSIS — E1165 Type 2 diabetes mellitus with hyperglycemia: Secondary | ICD-10-CM | POA: Diagnosis not present

## 2024-06-29 DIAGNOSIS — B9562 Methicillin resistant Staphylococcus aureus infection as the cause of diseases classified elsewhere: Secondary | ICD-10-CM | POA: Diagnosis not present

## 2024-06-29 DIAGNOSIS — I10 Essential (primary) hypertension: Secondary | ICD-10-CM | POA: Diagnosis not present

## 2024-06-29 DIAGNOSIS — E11628 Type 2 diabetes mellitus with other skin complications: Secondary | ICD-10-CM

## 2024-06-29 DIAGNOSIS — E785 Hyperlipidemia, unspecified: Secondary | ICD-10-CM | POA: Diagnosis not present

## 2024-06-29 DIAGNOSIS — M86171 Other acute osteomyelitis, right ankle and foot: Secondary | ICD-10-CM | POA: Diagnosis not present

## 2024-06-29 HISTORY — PX: TRANSMETATARSAL AMPUTATION: SHX6197

## 2024-06-29 HISTORY — PX: IRRIGATION AND DEBRIDEMENT FOOT: SHX6602

## 2024-06-29 LAB — GLUCOSE, CAPILLARY
Glucose-Capillary: 133 mg/dL — ABNORMAL HIGH (ref 70–99)
Glucose-Capillary: 167 mg/dL — ABNORMAL HIGH (ref 70–99)
Glucose-Capillary: 188 mg/dL — ABNORMAL HIGH (ref 70–99)
Glucose-Capillary: 444 mg/dL — ABNORMAL HIGH (ref 70–99)
Glucose-Capillary: 494 mg/dL — ABNORMAL HIGH (ref 70–99)
Glucose-Capillary: 494 mg/dL — ABNORMAL HIGH (ref 70–99)
Glucose-Capillary: 506 mg/dL (ref 70–99)

## 2024-06-29 LAB — AEROBIC/ANAEROBIC CULTURE W GRAM STAIN (SURGICAL/DEEP WOUND)

## 2024-06-29 LAB — TYPE AND SCREEN
ABO/RH(D): B POS
Antibody Screen: NEGATIVE

## 2024-06-29 SURGERY — AMPUTATION, FOOT, TRANSMETATARSAL
Anesthesia: General | Site: Foot | Laterality: Right

## 2024-06-29 MED ORDER — MIDAZOLAM HCL 2 MG/2ML IJ SOLN
INTRAMUSCULAR | Status: DC | PRN
  Administered 2024-06-29: 2 mg via INTRAVENOUS

## 2024-06-29 MED ORDER — MIDAZOLAM HCL 2 MG/2ML IJ SOLN
INTRAMUSCULAR | Status: AC
Start: 2024-06-29 — End: 2024-06-29
  Filled 2024-06-29: qty 2

## 2024-06-29 MED ORDER — DEXMEDETOMIDINE HCL IN NACL 80 MCG/20ML IV SOLN
INTRAVENOUS | Status: DC | PRN
  Administered 2024-06-29: 12 ug via INTRAVENOUS
  Administered 2024-06-29: 8 ug via INTRAVENOUS

## 2024-06-29 MED ORDER — ONDANSETRON HCL 4 MG/2ML IJ SOLN
INTRAMUSCULAR | Status: DC | PRN
Start: 2024-06-29 — End: 2024-06-29
  Administered 2024-06-29: 4 mg via INTRAVENOUS

## 2024-06-29 MED ORDER — FENTANYL CITRATE (PF) 100 MCG/2ML IJ SOLN: 25.0000 ug | INTRAMUSCULAR | Status: DC | PRN

## 2024-06-29 MED ORDER — OXYCODONE HCL 5 MG/5ML PO SOLN: 5.0000 mg | Freq: Once | ORAL | Status: DC | PRN

## 2024-06-29 MED ORDER — KETAMINE HCL 50 MG/5ML IJ SOSY
PREFILLED_SYRINGE | INTRAMUSCULAR | Status: AC
  Filled 2024-06-29: qty 5

## 2024-06-29 MED ORDER — 0.9 % SODIUM CHLORIDE (POUR BTL) OPTIME
TOPICAL | Status: DC | PRN
  Administered 2024-06-29: 1000 mL

## 2024-06-29 MED ORDER — BUPIVACAINE HCL (PF) 0.5 % IJ SOLN
INTRAMUSCULAR | Status: AC
  Filled 2024-06-29: qty 30

## 2024-06-29 MED ORDER — BUPIVACAINE HCL (PF) 0.5 % IJ SOLN
INTRAMUSCULAR | Status: DC | PRN
  Administered 2024-06-29: 20 mL

## 2024-06-29 MED ORDER — DEXAMETHASONE SODIUM PHOSPHATE 10 MG/ML IJ SOLN
INTRAMUSCULAR | Status: DC | PRN
  Administered 2024-06-29: 10 mg via INTRAVENOUS

## 2024-06-29 MED ORDER — PHENYLEPHRINE HCL-NACL 20-0.9 MG/250ML-% IV SOLN
INTRAVENOUS | Status: DC | PRN
  Administered 2024-06-29: 20 ug/min via INTRAVENOUS

## 2024-06-29 MED ORDER — PHENYLEPHRINE HCL-NACL 20-0.9 MG/250ML-% IV SOLN
INTRAVENOUS | Status: AC
Start: 2024-06-29 — End: 2024-06-29
  Filled 2024-06-29: qty 250

## 2024-06-29 MED ORDER — FENTANYL CITRATE (PF) 100 MCG/2ML IJ SOLN
INTRAMUSCULAR | Status: AC
  Filled 2024-06-29: qty 2

## 2024-06-29 MED ORDER — KETAMINE HCL 10 MG/ML IJ SOLN
INTRAMUSCULAR | Status: DC | PRN
Start: 2024-06-29 — End: 2024-06-29
  Administered 2024-06-29: 30 mg via INTRAVENOUS

## 2024-06-29 MED ORDER — ACETAMINOPHEN 10 MG/ML IV SOLN
INTRAVENOUS | Status: AC
Start: 2024-06-29 — End: 2024-06-29
  Filled 2024-06-29: qty 100

## 2024-06-29 MED ORDER — ACETAMINOPHEN 10 MG/ML IV SOLN
INTRAVENOUS | Status: DC | PRN
  Administered 2024-06-29: 1000 mg via INTRAVENOUS

## 2024-06-29 MED ORDER — INSULIN ASPART 100 UNIT/ML IJ SOLN
20.0000 [IU] | Freq: Once | INTRAMUSCULAR | Status: AC
  Administered 2024-06-29: 20 [IU] via SUBCUTANEOUS
  Filled 2024-06-29: qty 1

## 2024-06-29 MED ORDER — LIDOCAINE HCL (CARDIAC) PF 100 MG/5ML IV SOSY
PREFILLED_SYRINGE | INTRAVENOUS | Status: DC | PRN
  Administered 2024-06-29: 100 mg via INTRAVENOUS

## 2024-06-29 MED ORDER — POVIDONE-IODINE 7.5 % EX SOLN: Freq: Once | CUTANEOUS | Status: DC

## 2024-06-29 MED ORDER — PROPOFOL 10 MG/ML IV BOLUS
INTRAVENOUS | Status: DC | PRN
  Administered 2024-06-29: 150 mg via INTRAVENOUS

## 2024-06-29 MED ORDER — PHENYLEPHRINE 80 MCG/ML (10ML) SYRINGE FOR IV PUSH (FOR BLOOD PRESSURE SUPPORT)
PREFILLED_SYRINGE | INTRAVENOUS | Status: DC | PRN
  Administered 2024-06-29: 80 ug via INTRAVENOUS

## 2024-06-29 MED ORDER — LIDOCAINE HCL (PF) 1 % IJ SOLN
INTRAMUSCULAR | Status: AC
  Filled 2024-06-29: qty 30

## 2024-06-29 MED ORDER — LACTATED RINGERS IV SOLN
INTRAVENOUS | Status: DC | PRN
Start: 2024-06-29 — End: 2024-06-29

## 2024-06-29 MED ORDER — FENTANYL CITRATE (PF) 100 MCG/2ML IJ SOLN
INTRAMUSCULAR | Status: DC | PRN
  Administered 2024-06-29: 50 ug via INTRAVENOUS

## 2024-06-29 MED ORDER — OXYCODONE HCL 5 MG PO TABS: 5.0000 mg | ORAL_TABLET | Freq: Once | ORAL | Status: DC | PRN

## 2024-06-29 SURGICAL SUPPLY — 69 items
BAG COUNTER SPONGE SURGICOUNT (BAG) ×2 IMPLANT
BLADE OSC/SAGITTAL MD 5.5X18 (BLADE) IMPLANT
BLADE OSC/SAGITTAL MD 9X18.5 (BLADE) ×2 IMPLANT
BLADE SURG 15 STRL LF DISP TIS (BLADE) ×4 IMPLANT
BLADE SW THK.38XMED LNG THN (BLADE) ×2 IMPLANT
BNDG COHESIVE 4X5 TAN STRL LF (GAUZE/BANDAGES/DRESSINGS) ×2 IMPLANT
BNDG ELASTIC 3X5.8 VLCR NS LF (GAUZE/BANDAGES/DRESSINGS) IMPLANT
BNDG ELASTIC 4INX 5YD STR LF (GAUZE/BANDAGES/DRESSINGS) ×2 IMPLANT
BNDG ELASTIC 4X5.8 VLCR NS LF (GAUZE/BANDAGES/DRESSINGS) IMPLANT
BNDG ELASTIC 6INX 5YD STR LF (GAUZE/BANDAGES/DRESSINGS) ×2 IMPLANT
BNDG ESMARCH 4X12 STRL LF (GAUZE/BANDAGES/DRESSINGS) ×2 IMPLANT
BNDG GAUZE DERMACEA FLUFF 4 (GAUZE/BANDAGES/DRESSINGS) ×4 IMPLANT
BNDG STRETCH 4X75 STRL LF (GAUZE/BANDAGES/DRESSINGS) ×2 IMPLANT
BNDG STRETCH GAUZE 3IN X12FT (GAUZE/BANDAGES/DRESSINGS) IMPLANT
CNTNR URN SCR LID CUP LEK RST (MISCELLANEOUS) IMPLANT
CUFF TOURN SGL QUICK 12 (TOURNIQUET CUFF) ×2 IMPLANT
CUFF TOURN SGL QUICK 18X4 (TOURNIQUET CUFF) ×2 IMPLANT
DRAIN PENROSE 12X.25 LTX STRL (MISCELLANEOUS) IMPLANT
DRAPE BILAT LIMB 76X120 89291 (MISCELLANEOUS) IMPLANT
DRAPE FLUOR MINI C-ARM 54X84 (DRAPES) ×2 IMPLANT
DRAPE IMP U-DRAPE 54X76 (DRAPES) IMPLANT
DRSG EMULSION OIL 3X3 NADH (GAUZE/BANDAGES/DRESSINGS) IMPLANT
DRSG MEPILEX FLEX 3X3 (GAUZE/BANDAGES/DRESSINGS) IMPLANT
DRSG XEROFORM 1X8 (GAUZE/BANDAGES/DRESSINGS) IMPLANT
DURAPREP 26ML APPLICATOR (WOUND CARE) ×2 IMPLANT
ELECTRODE REM PT RTRN 9FT ADLT (ELECTROSURGICAL) ×2 IMPLANT
GAUZE PACKING 0.25INX5YD STRL (GAUZE/BANDAGES/DRESSINGS) IMPLANT
GAUZE SPONGE 4X4 12PLY STRL (GAUZE/BANDAGES/DRESSINGS) ×2 IMPLANT
GAUZE STRETCH 2X75IN STRL (MISCELLANEOUS) IMPLANT
GAUZE XEROFORM 1X8 LF (GAUZE/BANDAGES/DRESSINGS) ×4 IMPLANT
GLOVE BIO SURGEON STRL SZ7 (GLOVE) ×2 IMPLANT
GLOVE INDICATOR 7.0 STRL GRN (GLOVE) ×2 IMPLANT
GLOVE INDICATOR 7.5 STRL GRN (GLOVE) ×2 IMPLANT
GOWN STRL REUS W/ TWL LRG LVL3 (GOWN DISPOSABLE) ×4 IMPLANT
HANDLE YANKAUER SUCT BULB TIP (MISCELLANEOUS) IMPLANT
HANDPIECE VERSAJET DEBRIDEMENT (MISCELLANEOUS) IMPLANT
IV NS 1000ML BAXH (IV SOLUTION) IMPLANT
KIT TURNOVER KIT A (KITS) ×2 IMPLANT
LABEL OR SOLS (LABEL) IMPLANT
MANIFOLD NEPTUNE II (INSTRUMENTS) ×2 IMPLANT
MATRIX TISSUE MESHED 4X5 (Tissue) IMPLANT
NDL HYPO 25X1 1.5 SAFETY (NEEDLE) ×4 IMPLANT
NDL SAFETY ECLIPSE 18X1.5 (NEEDLE) ×2 IMPLANT
NEEDLE HYPO 25X1 1.5 SAFETY (NEEDLE) ×4 IMPLANT
NS IRRIG 500ML POUR BTL (IV SOLUTION) ×2 IMPLANT
PACK EXTREMITY ARMC (MISCELLANEOUS) ×2 IMPLANT
PACKING GAUZE IODOFORM 1INX5YD (GAUZE/BANDAGES/DRESSINGS) IMPLANT
PAD ABD DERMACEA PRESS 5X9 (GAUZE/BANDAGES/DRESSINGS) IMPLANT
PENCIL SMOKE EVACUATOR (MISCELLANEOUS) ×2 IMPLANT
RASP SM TEAR CROSS CUT (RASP) IMPLANT
SOL .9 NS 3000ML IRR UROMATIC (IV SOLUTION) IMPLANT
SOLUTION PREP PVP 2OZ (MISCELLANEOUS) ×2 IMPLANT
SPONGE T-LAP 18X18 ~~LOC~~+RFID (SPONGE) ×2 IMPLANT
STAPLER SKIN PROX 35W (STAPLE) ×2 IMPLANT
STOCKINETTE M/LG 89821 (MISCELLANEOUS) ×2 IMPLANT
STOCKINETTE STRL 6IN 960660 (GAUZE/BANDAGES/DRESSINGS) ×2 IMPLANT
STRIP CLOSURE SKIN 1/4X4 (GAUZE/BANDAGES/DRESSINGS) ×2 IMPLANT
SUT ETHILON 2 0 FS 18 (SUTURE) ×4 IMPLANT
SUT ETHILON 2 0 FSLX (SUTURE) ×2 IMPLANT
SUT VIC AB 2-0 CT1 TAPERPNT 27 (SUTURE) ×2 IMPLANT
SUT VIC AB 2-0 SH 27XBRD (SUTURE) ×2 IMPLANT
SUT VIC AB 3-0 SH 27X BRD (SUTURE) ×4 IMPLANT
SUTURE EHLN 3-0 FS-10 30 BLK (SUTURE) ×4 IMPLANT
SUTURE ETHLN 4-0 FS2 18XMF BLK (SUTURE) IMPLANT
SWAB CULTURE AMIES ANAERIB BLU (MISCELLANEOUS) IMPLANT
SYR 10ML LL (SYRINGE) ×4 IMPLANT
TIP FAN IRRIG PULSAVAC PLUS (DISPOSABLE) IMPLANT
TRAP FLUID SMOKE EVACUATOR (MISCELLANEOUS) ×2 IMPLANT
WATER STERILE IRR 500ML POUR (IV SOLUTION) ×2 IMPLANT

## 2024-06-29 NOTE — Anesthesia Procedure Notes (Signed)
 Procedure Name: LMA Insertion Date/Time: 06/29/2024 8:44 AM  Performed by: Landy Francena BIRCH, CRNAPre-anesthesia Checklist: Patient identified, Emergency Drugs available, Suction available and Patient being monitored Patient Re-evaluated:Patient Re-evaluated prior to induction Oxygen  Delivery Method: Circle system utilized Preoxygenation: Pre-oxygenation with 100% oxygen  Induction Type: IV induction LMA: LMA inserted LMA Size: 5.0 Number of attempts: 1 Placement Confirmation: positive ETCO2 and breath sounds checked- equal and bilateral Tube secured with: Tape Dental Injury: Teeth and Oropharynx as per pre-operative assessment

## 2024-06-29 NOTE — Consult Note (Signed)
 NAME: Curvin Hunger  DOB: Apr 04, 1966  MRN: [id number]  Date/Time: 06/29/2024 5:44 PM  REQUESTING PROVIDER- [employer]  Subjective:  REASON FOR CONSULT: MRSA bacteremia ? Sally Reimers is a 58 y.o. with a history of DM, DFI, , MRSA bacteremia and MRSA foot infection, Rt TMA, Left foot chronic wound presents with redness, pain swelling , discharge from the previous rt TMA site on 06/26/24   MRSA bacteremia 1/4 bottle in May 2025 when he had the infected rt foot and underwent TMA on 04/23/24 ( margins clear of osteo) . Culture MRSA and bunch of gram neg rods ESBL organism was present treated with appropriate IV antibiotics  IV daptomycin  and Iv meropenem2 weeks followed by 2 more weeks of PO.antibiotics.  7/3 had delayed cloure and excision of distal met 2/3/4/- no path or culture at that time Saw Podiatrist as f/u on 7/15- found to be okay Presented on 7/24 with acute onset of rt foot pain with drianage Vitals in the ED  06/26/24 13:37  BP 122/81  Temp 98.1 F (36.7 C)  Pulse Rate 94  Resp 17  SpO2 100 %    Latest Reference Range & Units 06/26/24 13:37  WBC 4.0 - 10.5 K/uL 12.3 (H)  Hemoglobin 13.0 - 17.0 g/dL 8.7 (L)  HCT 60.9 - 47.9 % 29.2 (L)  Platelets 150 - 400 K/uL 493 (H)  Creatinine 0.61 - 1.24 mg/dL 8.57 (H)   Past Medical History:  Diagnosis Date   Amputation of right great toe (HCC)    Cellulitis and abscess of foot    CKD stage 3a, GFR 45-59 ml/min (HCC)    Cocaine abuse (HCC)    + UDS on 05-12-24   DM (diabetes mellitus), type 2 (HCC)    ESBL (extended spectrum beta-lactamase) producing bacteria infection 04/18/2024   Hyperlipidemia    Hypertension    MRSA bacteremia    Normocytic anemia    Obesity    Tobacco abuse    Toe osteomyelitis, left (HCC)    Tuberculosis    tested positive,treated with medications.    Past Surgical History:  Procedure Laterality Date   AMPUTATION TOE Right 03/26/2024   Procedure: AMPUTATION, TOE;  Surgeon: Harden Jerona GAILS, MD;  Location: [facility]  OR;  Service: Orthopedics;  Laterality: Right;  RIGHT GREAT TOE AMPUTATION   BONE BIOPSY  04/18/2024   Procedure: BIOPSY, BONE (2ND METATARSAL);  Surgeon: Ashley Soulier, DPM;  Location: [facility] ORS;  Service: Orthopedics/Podiatry;;   FOOT SURGERY Left    I & D EXTREMITY Left 05/05/2022   Procedure: LEFT FOOT DEBRIDEMENT;  Surgeon: Harden Jerona GAILS, MD;  Location: [facility] OR;  Service: Orthopedics;  Laterality: Left;   INCISION AND DRAINAGE OF WOUND Right 06/05/2024   Procedure: DELAYED PRIMARY CLOSURE OF RIGHT FOOT WOUND, EXCISION OF DISTAL METATARSAL 2, 3, 4, and 5;  Surgeon: Ashley Soulier, DPM;  Location: [facility] ORS;  Service: Orthopedics/Podiatry;  Laterality: Right;   IRRIGATION AND DEBRIDEMENT FOOT Right 04/18/2024   Procedure: IRRIGATION AND DEBRIDEMENT FOOT, PLACEMENT OF ANTIBIOTIC BEADS;  Surgeon: Ashley Soulier, DPM;  Location: [facility] ORS;  Service: Orthopedics/Podiatry;  Laterality: Right;   MYRINGOTOMY WITH TUBE PLACEMENT Bilateral 07/20/2022   Procedure: MYRINGOTOMY WITH TUBE PLACEMENT;  Surgeon: Juengel, Paul, MD;  Location: [facility];  Service: ENT;  Laterality: Bilateral;  Diabetic   TONSILLECTOMY     TRANSMETATARSAL AMPUTATION Right 04/23/2024   Procedure: AMPUTATION, FOOT, TRANSMETATARSAL;  Surgeon: Ashley Soulier, DPM;  Location: [facility] ORS;  Service: Orthopedics/Podiatry;  Laterality: Right;  Social History   Socioeconomic History   Marital status: Single    Spouse name: Not on file   Number of children: Not on file   Years of education: Not on file   Highest education level: Not on file  Occupational History   Not on file  Tobacco Use   Smoking status: Former    Current packs/day: 0.00    Average packs/day: 0.3 packs/day for 25.0 years (6.3 ttl pk-yrs)    Types: Cigarettes    Start date: 09/17/1997    Quit date: 09/17/2022    Years since quitting: 1.7   Smokeless tobacco: Never   Tobacco comments:    Quit  Vaping Use   Vaping status: Never Used  Substance and  Sexual Activity   Alcohol use: No   Drug use: No   Sexual activity: Yes  Other Topics Concern   Not on file  Social History Narrative   Not on file   Social Drivers of Health   Financial Resource Strain: Patient Declined (06/28/2023)   Received from [facility] [employer] Strain (CARDIA)    Difficulty of Paying Living Expenses: Patient declined  Food Insecurity: No Food Insecurity (06/26/2024)   Hunger Vital Sign    Worried About Running Out of Food in the Last Year: Never true    Ran Out of Food in the Last Year: Never true  Transportation Needs: No Transportation Needs (06/26/2024)   PRAPARE - Administrator, Civil Service (Medical): No    Lack of Transportation (Non-Medical): No  Physical Activity: Not on file  Stress: Not on file  Social Connections: Moderately Integrated (06/26/2024)   Social Connection and Isolation Panel    Frequency of Communication with Friends and Family: Three times a week    Frequency of Social Gatherings with Friends and Family: Twice a week    Attends Religious Services: 1 to 4 times per year    Active Member of [employer] or Organizations: Yes    Attends Banker Meetings: 1 to 4 times per year    Marital Status: Never married  Intimate Partner Violence: Not At Risk (06/26/2024)   Humiliation, Afraid, Rape, and Kick questionnaire    Fear of Current or Ex-Partner: No    Emotionally Abused: No    Physically Abused: No    Sexually Abused: No    Family History  Problem Relation Age of Onset   Colon cancer Neg Hx    Colon polyps Neg Hx    Crohn's disease Neg Hx    Esophageal cancer Neg Hx    Rectal cancer Neg Hx    Stomach cancer Neg Hx    Ulcerative colitis Neg Hx    No Known Allergies I? Current Facility-Administered Medications  Medication Dose Route Frequency Provider Last Rate Last Admin   acetaminophen  (TYLENOL ) tablet 650 mg  650 mg Oral Q6H PRN Lennie Barter, DPM   650 mg at  06/27/24 2209   Or   acetaminophen  (TYLENOL ) suppository 650 mg  650 mg Rectal Q6H PRN Lennie Barter, DPM       ascorbic acid  (VITAMIN C ) tablet 500 mg  500 mg Oral Daily Lennie Barter, DPM   500 mg at 06/28/24 9093   cyanocobalamin  (VITAMIN B12) tablet 1,000 mcg  1,000 mcg Oral Daily Lennie Barter, DPM   1,000 mcg at 06/28/24 9093   DAPTOmycin  (CUBICIN ) 800 mg in sodium chloride  0.9 % IVPB  8 mg/kg Intravenous Q1400 Lennie Barter,  DPM 132 mL/hr at 06/29/24 1355 800 mg at 06/29/24 1355   enoxaparin  (LOVENOX ) injection 50 mg  0.5 mg/kg Subcutaneous Q24H Lennie Barter, DPM   50 mg at 06/28/24 2230   folic acid  (FOLVITE ) tablet 1 mg  1 mg Oral Daily Lennie Barter, DPM   1 mg at 06/28/24 1209   gabapentin  (NEURONTIN ) capsule 300 mg  300 mg Oral BID Lennie Barter, DPM   300 mg at 06/28/24 2229   insulin  aspart (novoLOG ) injection 0-15 Units  0-15 Units Subcutaneous TID WC Lennie Barter, DPM   2 Units at 06/28/24 1658   insulin  aspart (novoLOG ) injection 0-5 Units  0-5 Units Subcutaneous QHS Lennie Barter, DPM       insulin  NPH Human (NOVOLIN N) injection 22 Units  22 Units Subcutaneous BID AC & HS Lennie Barter, DPM   22 Units at 06/28/24 2238   iron  polysaccharides (NIFEREX) capsule 150 mg  150 mg Oral Daily Lennie Barter, DPM   150 mg at 06/28/24 9093   losartan  (COZAAR ) tablet 25 mg  25 mg Oral Daily Lennie Barter, DPM   25 mg at 06/28/24 9093   meropenem  (MERREM ) 1 g in sodium chloride  0.9 % 100 mL IVPB  1 g Intravenous Q8H Lennie Barter, DPM 200 mL/hr at 06/29/24 1448 1 g at 06/29/24 1448   methocarbamol  (ROBAXIN ) tablet 750 mg  750 mg Oral Q6H PRN Lennie Barter, DPM   750 mg at 06/29/24 9573   mupirocin  ointment (BACTROBAN ) 2 % 1 Application  1 Application Nasal BID Lennie Barter, DPM   1 Application at 06/28/24 9085   nitroGLYCERIN  (NITRODUR - Dosed in mg/24 hr) patch 0.2 mg  0.2 mg Transdermal Daily Lennie Barter, DPM   0.2 mg at 06/28/24 9092   ondansetron  (ZOFRAN ) tablet 4 mg  4 mg Oral Q6H  PRN Lennie Barter, DPM       Or   ondansetron  (ZOFRAN ) injection 4 mg  4 mg Intravenous Q6H PRN Lennie Barter, DPM       oxyCODONE -acetaminophen  (PERCOCET/ROXICET) 5-325 MG per tablet 1-2 tablet  1-2 tablet Oral Q4H PRN Lennie Barter, DPM   2 tablet at 06/29/24 1352   polyethylene glycol (MIRALAX  / GLYCOLAX ) packet 17 g  17 g Oral BID PRN Lennie Barter, DPM       traZODone  (DESYREL ) tablet 25 mg  25 mg Oral QHS PRN Lennie Barter, DPM   25 mg at 06/28/24 2244   [START ON 07/01/2024] Vitamin D  (Ergocalciferol ) (DRISDOL ) 1.25 MG (50000 UNIT) capsule 50,000 Units  50,000 Units Oral Q Sonjia Lennie Barter, DPM         Abtx:  Anti-infectives (From admission, onward)    Start     Dose/Rate Route Frequency Ordered Stop   06/28/24 1145  DAPTOmycin  (CUBICIN ) 800 mg in sodium chloride  0.9 % IVPB        8 mg/kg  102.1 kg 132 mL/hr over 30 Minutes Intravenous Daily 06/28/24 1051     06/27/24 2200  linezolid  (ZYVOX ) tablet 600 mg  Status:  Discontinued        600 mg Oral Every 12 hours 06/27/24 1003 06/28/24 1048   06/27/24 1300  meropenem  (MERREM ) 1 g in sodium chloride  0.9 % 100 mL IVPB        1 g 200 mL/hr over 30 Minutes Intravenous Every 8 hours 06/27/24 0958     06/27/24 0000  vancomycin  (VANCOCIN ) IVPB 1000 mg/200 mL premix       Placed in Followed by Linked  Group   1,000 mg 200 mL/hr over 60 Minutes Intravenous  Once 06/26/24 2127 06/27/24 0111   06/26/24 2215  piperacillin -tazobactam (ZOSYN ) IVPB 3.375 g  Status:  Discontinued       Placed in And Linked Group   3.375 g 100 mL/hr over 30 Minutes Intravenous  Once 06/26/24 2118 06/26/24 2127   06/26/24 2215  vancomycin  (VANCOREADY) IVPB 1250 mg/250 mL       Placed in Followed by Linked Group   1,250 mg 166.7 mL/hr over 90 Minutes Intravenous  Once 06/26/24 2127 06/27/24 0000   06/26/24 2215  piperacillin -tazobactam (ZOSYN ) IVPB 3.375 g  Status:  Discontinued        3.375 g 12.5 mL/hr over 240 Minutes Intravenous Every 8 hours 06/26/24  2127 06/27/24 0958   06/26/24 2141  vancomycin  variable dose per unstable renal function (pharmacist dosing)  Status:  Discontinued         Does not apply See admin instructions 06/26/24 2141 06/27/24 1004   06/26/24 1930  ciprofloxacin  (CIPRO ) IVPB 400 mg        400 mg 200 mL/hr over 60 Minutes Intravenous  Once 06/26/24 1923 06/26/24 2047       REVIEW OF SYSTEMS:  Const: negative fever, negative chills, negative weight loss Eyes: negative diplopia or visual changes, negative eye pain ENT: negative coryza, negative sore throat Resp: negative cough, hemoptysis, dyspnea Cards: negative for chest pain, palpitations, lower extremity edema GU: negative for frequency, dysuria and hematuria GI: Negative for abdominal pain, diarrhea, bleeding, constipation Skin: negative for rash and pruritus Heme: negative for easy bruising and gum/nose bleeding MS: as above Neurolo:negative for headaches, dizziness, vertigo, memory problems  Psych: negative for feelings of anxiety, depression  Endocrine: , diabetes Allergy/Immunology- negative for any medication or food allergies ?  Objective:  VITALS:  BP (!) 127/55 (BP Location: Right Arm)   Pulse 94   Temp (!) 97.4 F (36.3 C) (Oral)   Resp 16   Ht 5' 10 (1.778 m)   Wt 102.1 kg   SpO2 100%   BMI 32.28 kg/m   PHYSICAL EXAM:  General: Alert, cooperative, no distress, appears stated age.  Head: Normocephalic, without obvious abnormality, atraumatic. Eyes: Conjunctivae clear, anicteric sclerae. Pupils are equal ENT Nares normal. No drainage or sinus tenderness. Lips, mucosa, and tongue normal. No Thrush Neck: Supple, symmetrical, no adenopathy, thyroid : non tender no carotid bruit and no JVD. Back: No CVA tenderness. Lungs: Clear to auscultation bilaterally. No Wheezing or Rhonchi. No rales. Heart: Regular rate and rhythm, no murmur, rub or gallop. Abdomen: Soft, non-tender,not distended. Bowel sounds normal. No masses Extremities:rt  leg  Prior to surgery  Skin: No rashes or lesions. Or bruising Lymph: Cervical, supraclavicular normal. Neurologic: Grossly non-focal Pertinent Labs Lab Results CBC    Component Value Date/Time   WBC 8.4 06/28/2024 0536   RBC 3.12 (L) 06/28/2024 0536   HGB 7.7 (L) 06/28/2024 0536   HCT 25.3 (L) 06/28/2024 0536   PLT 437 (H) 06/28/2024 0536   MCV 81.1 06/28/2024 0536   MCH 24.7 (L) 06/28/2024 0536   MCHC 30.4 06/28/2024 0536   RDW 14.9 06/28/2024 0536   LYMPHSABS 1.7 06/26/2024 1337   MONOABS 0.6 06/26/2024 1337   EOSABS 0.1 06/26/2024 1337   BASOSABS 0.0 06/26/2024 1337       Latest Ref Rng & Units 06/28/2024    5:36 AM 06/27/2024    5:02 AM 06/26/2024    1:37 PM  CMP  Glucose 70 -  99 mg/dL 840  859  654   BUN 6 - 20 mg/dL 16  17  17    Creatinine 0.61 - 1.24 mg/dL 8.72  8.61  8.57   Sodium 135 - 145 mmol/L 134  137  135   Potassium 3.5 - 5.1 mmol/L 4.3  4.1  5.0   Chloride 98 - 111 mmol/L 102  101  100   CO2 22 - 32 mmol/L 27  28  24    Calcium  8.9 - 10.3 mg/dL 8.2  8.3  8.7   Total Protein 6.5 - 8.1 g/dL   7.5   Total Bilirubin 0.0 - 1.2 mg/dL   0.4   Alkaline Phos 38 - 126 U/L   88   AST 15 - 41 U/L   11   ALT 0 - 44 U/L   9       Microbiology: Recent Results (from the past 240 hours)  Culture, blood (Routine x 2)     Status: Abnormal (Preliminary result)   Collection Time: 06/26/24  1:37 PM   Specimen: BLOOD  Result Value Ref Range Status   Specimen Description   Final    BLOOD RIGHT ANTECUBITAL Performed at [facility], [address] [place], [place] [address]    Special Requests   Final    BOTTLES DRAWN AEROBIC AND ANAEROBIC Blood Culture adequate volume Performed at [facility] [address], [place], [place] [address]    Culture  Setup Time   Final    GRAM POSITIVE COCCI IN BOTH AEROBIC AND ANAEROBIC BOTTLES CRITICAL RESULT CALLED TO, READ BACK BY AND VERIFIED WITH: ERIN [facility] AT 1615 06/27/24.PMF Performed at [facility], [address] [place], [place] [address]    Culture METHICILLIN RESISTANT STAPHYLOCOCCUS AUREUS (A)  Final   Report Status PENDING  Incomplete   Organism ID, Bacteria METHICILLIN RESISTANT STAPHYLOCOCCUS AUREUS  Final      Susceptibility   Methicillin resistant staphylococcus aureus - MIC*    CIPROFLOXACIN  >=8 RESISTANT Resistant     ERYTHROMYCIN >=8 RESISTANT Resistant     GENTAMICIN <=0.5 SENSITIVE Sensitive     OXACILLIN >=4 RESISTANT Resistant     TETRACYCLINE >=16 RESISTANT Resistant     VANCOMYCIN  1 SENSITIVE Sensitive     TRIMETH/SULFA >=320 RESISTANT Resistant     CLINDAMYCIN <=0.25 SENSITIVE Sensitive     RIFAMPIN <=0.5 SENSITIVE Sensitive     Inducible Clindamycin NEGATIVE Sensitive     LINEZOLID  2 SENSITIVE Sensitive     * METHICILLIN RESISTANT STAPHYLOCOCCUS AUREUS  Blood Culture ID Panel (Reflexed)     Status: Abnormal   Collection Time: 06/26/24  1:37 PM  Result Value Ref Range Status   Enterococcus faecalis NOT DETECTED NOT DETECTED Final   Enterococcus Faecium NOT DETECTED NOT DETECTED Final   Listeria monocytogenes NOT DETECTED NOT DETECTED Final   Staphylococcus species DETECTED (A) NOT DETECTED Final    Comment: CRITICAL RESULT CALLED TO, READ BACK BY AND VERIFIED WITH: ERIN MEEGIN AT 1615 06/27/24.PMF    Staphylococcus aureus (BCID) DETECTED (A) NOT DETECTED Final    Comment: Methicillin (oxacillin)-resistant Staphylococcus aureus (MRSA). MRSA is predictably resistant to beta-lactam antibiotics (except ceftaroline). Preferred therapy is vancomycin  unless clinically contraindicated. Patient requires contact precautions if  hospitalized. CRITICAL RESULT CALLED TO, READ BACK BY AND VERIFIED WITH: ERIN MEEGIN AT 1615 06/27/24.PMF    Staphylococcus epidermidis NOT DETECTED NOT DETECTED Final   Staphylococcus lugdunensis NOT DETECTED NOT DETECTED Final   Streptococcus species NOT DETECTED  NOT DETECTED Final   Streptococcus agalactiae NOT DETECTED NOT  DETECTED Final   Streptococcus pneumoniae NOT DETECTED NOT DETECTED Final   Streptococcus pyogenes NOT DETECTED NOT DETECTED Final   A.calcoaceticus-baumannii NOT DETECTED NOT DETECTED Final   Bacteroides fragilis NOT DETECTED NOT DETECTED Final   Enterobacterales NOT DETECTED NOT DETECTED Final   Enterobacter cloacae complex NOT DETECTED NOT DETECTED Final   Escherichia coli NOT DETECTED NOT DETECTED Final   Klebsiella aerogenes NOT DETECTED NOT DETECTED Final   Klebsiella oxytoca NOT DETECTED NOT DETECTED Final   Klebsiella pneumoniae NOT DETECTED NOT DETECTED Final   Proteus species NOT DETECTED NOT DETECTED Final   Salmonella species NOT DETECTED NOT DETECTED Final   Serratia marcescens NOT DETECTED NOT DETECTED Final   Haemophilus influenzae NOT DETECTED NOT DETECTED Final   Neisseria meningitidis NOT DETECTED NOT DETECTED Final   Pseudomonas aeruginosa NOT DETECTED NOT DETECTED Final   Stenotrophomonas maltophilia NOT DETECTED NOT DETECTED Final   Candida albicans NOT DETECTED NOT DETECTED Final   Candida auris NOT DETECTED NOT DETECTED Final   Candida glabrata NOT DETECTED NOT DETECTED Final   Candida krusei NOT DETECTED NOT DETECTED Final   Candida parapsilosis NOT DETECTED NOT DETECTED Final   Candida tropicalis NOT DETECTED NOT DETECTED Final   Cryptococcus neoformans/gattii NOT DETECTED NOT DETECTED Final   Meth resistant mecA/C and MREJ DETECTED (A) NOT DETECTED Final    Comment: CRITICAL RESULT CALLED TO, READ BACK BY AND VERIFIED WITH: ERIN MEEGIN AT 1615 06/27/24.PMF Performed at [facility], [address], [place], [place] [address]   Aerobic/Anaerobic Culture w Gram Stain (surgical/deep wound)     Status: None   Collection Time: 06/26/24  4:03 PM   Specimen: Foot  Result Value Ref Range Status   Specimen Description   Final    FOOT Performed at [facility], [address], [place], [place] [address]    Special Requests   Final    RIGHT  FOOT Performed at [facility], [address] [place], [place] [address]    Gram Stain   Final    RARE WBC PRESENT, PREDOMINANTLY PMN NO ORGANISMS SEEN Performed at [facility], [address], [place], [place] [address]    Culture   Final    FEW STAPHYLOCOCCUS AUREUS MODERATE DIPHTHEROIDS(CORYNEBACTERIUM SPECIES) Standardized susceptibility testing for this organism is not available. RARE KLEBSIELLA PNEUMONIAE Confirmed Extended Spectrum Beta-Lactamase Producer (ESBL).  In bloodstream infections from ESBL organisms, carbapenems are preferred over piperacillin /tazobactam. They are shown to have a lower risk of mortality. RARE PROVIDENCIA STUARTII    Report Status 06/29/2024 FINAL  Final   Organism ID, Bacteria STAPHYLOCOCCUS AUREUS  Final   Organism ID, Bacteria KLEBSIELLA PNEUMONIAE  Final   Organism ID, Bacteria PROVIDENCIA STUARTII  Final      Susceptibility   Klebsiella pneumoniae - MIC*    AMPICILLIN  >=32 RESISTANT Resistant     CEFEPIME  >=32 RESISTANT Resistant     CEFTAZIDIME RESISTANT Resistant     CEFTRIAXONE  >=64 RESISTANT Resistant     CIPROFLOXACIN  >=4 RESISTANT Resistant     GENTAMICIN <=1 SENSITIVE Sensitive     IMIPENEM <=0.25 SENSITIVE Sensitive     TRIMETH/SULFA >=320 RESISTANT Resistant     AMPICILLIN /SULBACTAM >=32 RESISTANT Resistant     PIP/TAZO 16 SENSITIVE Sensitive ug/mL    * RARE KLEBSIELLA PNEUMONIAE   Providencia stuartii - MIC*    AMPICILLIN  >=32 RESISTANT Resistant     CEFEPIME  <=0.12 SENSITIVE Sensitive  CEFTAZIDIME <=1 SENSITIVE Sensitive     CEFTRIAXONE  <=0.25 SENSITIVE Sensitive     CIPROFLOXACIN  1 RESISTANT Resistant     GENTAMICIN RESISTANT Resistant     IMIPENEM 2 SENSITIVE Sensitive     TRIMETH/SULFA <=20 SENSITIVE Sensitive     AMPICILLIN /SULBACTAM >=32 RESISTANT Resistant     PIP/TAZO <=4 SENSITIVE Sensitive ug/mL    * RARE PROVIDENCIA STUARTII   Staphylococcus aureus - MIC*    CIPROFLOXACIN  >=8 RESISTANT  Resistant     ERYTHROMYCIN >=8 RESISTANT Resistant     GENTAMICIN <=0.5 SENSITIVE Sensitive     OXACILLIN >=4 RESISTANT Resistant     TETRACYCLINE >=16 RESISTANT Resistant     VANCOMYCIN  1 SENSITIVE Sensitive     TRIMETH/SULFA >=320 RESISTANT Resistant     CLINDAMYCIN <=0.25 SENSITIVE Sensitive     RIFAMPIN <=0.5 SENSITIVE Sensitive     Inducible Clindamycin NEGATIVE Sensitive     LINEZOLID  2 SENSITIVE Sensitive     * FEW STAPHYLOCOCCUS AUREUS  Culture, blood (Routine x 2)     Status: None (Preliminary result)   Collection Time: 06/26/24 10:46 PM   Specimen: BLOOD  Result Value Ref Range Status   Specimen Description BLOOD BLOOD RIGHT ARM  Final   Special Requests   Final    BOTTLES DRAWN AEROBIC AND ANAEROBIC Blood Culture adequate volume   Culture   Final    NO GROWTH 3 DAYS Performed at [facility], [address] [place], [place] [address]    Report Status PENDING  Incomplete  Aerobic/Anaerobic Culture w Gram Stain (surgical/deep wound)     Status: None (Preliminary result)   Collection Time: 06/29/24 10:09 AM   Specimen: PATH Bone biopsy; Tissue  Result Value Ref Range Status   Specimen Description   Final    TISSUE Performed at [facility], [address] [place], [place] [address]    Special Requests   Final    BONE BOX Performed at [facility], [address] [place], [place] [address]    Gram Stain   Final    NO WBC SEEN NO ORGANISMS SEEN Performed at [facility], [address] [place], [place] [address]    Culture PENDING  Incomplete   Report Status PENDING  Incomplete    Lines and Device Date on insertion # of days DC  Central line     Foley     ETT       IMAGING RESULTS: MRI shows 2nd-5th ray amputation Hyperintense bone marrow edema I have personally reviewed the films ? Impression/Recommendation ? MRSA bacteremia- secondary to rt foot infection recurrent 2 d echo N Needs TEE On  daptomycin    Infection rt TMA-  ESBL klebsiella, MRSA and dipheroids in superficial culture - likely osteomyelitis On meropenem  s/p revision TMA today Surgical culture and bone pathology sent  DM  Neuropathy   Lft foot ulcer chronic- had graft placement today   Pt needs to go to SNF to get IV and stay off his feet so that both his feet wounds can heal. This consult involved complex antimicrobial management  ________________________________________________ Discussed with patient, and care team

## 2024-06-29 NOTE — Op Note (Addendum)
 PODIATRY / FOOT AND ANKLE SURGERY OPERATIVE REPORT    SURGEON: Prentice Norris, DPM  PRE-OPERATIVE DIAGNOSIS:  1.  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  Chronic left foot wounds with possible osteomyelitis 3.  Diabetes type 2 polyneuropathy  POST-OPERATIVE DIAGNOSIS: Same  PROCEDURE(S): Right revision transmetatarsal amputation Left foot subcutaneous wound debridement with biopsy Left foot wound graft placement  HEMOSTASIS: Right ankle tourniquet  ANESTHESIA: general  ESTIMATED BLOOD LOSS: 20 cc  FINDING(S): 1.  Bone exposed and present through right transmetatarsal amputation site metatarsals 2 through 5, hardening of tissues through the out the area, some seropurulent discharge present at the amputation site level 2.  Left foot wounds x 2 with no obvious bone exposed  PATHOLOGY/SPECIMEN(S): Right metatarsal bone culture, right metatarsal Path specimen.  Left wound tissue sample second metatarsal phalangeal joint area  INDICATIONS:   [name] is a 58 y.o. male who presents with chronic nonhealing wounds to both feet.  Patient had MRIs of both feet showing osteomyelitic changes present.  Patient has dislocation of left digits at metatarsal phalangeal joints of lesser joints.  Left foot wounds do not appear to have any signs of infection present currently at this time with no obvious bone exposed but discussed with patient concern for possible osteomyelitic changes present on MRI imaging performed a few weeks ago by wound care center.  Worse those the right foot which recently underwent transmetatarsal amputation with MRI imaging showing some signs of possible osteomyelitis versus where the bones were transected for the previous amputation.  Patient has bone exposed to this area with associated erythema and edema.  All treatment options were discussed with the patient both conservative and surgical attempts at correction include  potential risks and complications at this time patient is elected for surgery today consisting of revision right foot transmetatarsal amputation and left foot wound debridement with possible graft placement.  No guarantees given.  Patient remains at high risk for limb loss to bilateral lower extremities.   DESCRIPTION: After obtaining full informed written consent, the patient was brought back to the operating room and placed supine upon the operating table.  The patient received IV antibiotics prior to induction.  After obtaining adequate anesthesia, the patient was prepped and draped in the standard fashion.  An Esmarch bandage used to exsanguinate the right lower extremity pneumatic ankle tourniquet was inflated.  20 cc of percent Marcaine  plain was injected about the ankle for an ankle block.  Attention was then directed to the right transmetatarsal amputation site.  Incision was marked out to ellipse the open wound throughout the entirety of the previous transmetatarsal amputation area.  The incision was made straight down to bone at this area.  The skin and subcutaneous tissue was ellipsed and passed off the operative site.  Full-thickness flaps were then raised off of the previously resected metatarsals thereby expose the metatarsals 1 through 5.  Once metatarsals 1 through 5 were exposed sagittal bone saw was used to transect the metatarsals 1 through 5 with the appropriate beveling leaving a parabola intact.  The metatarsals were then resected and passed off the operative site.  A bone culture was taken and the rest was sent off for pathology.  There did not appear to be any further evidence for infection at the area of the operative site.  The bone appeared to be fairly hard in nature.  The surgical site was flushed with copious amounts normal sterile saline.  Any bleeding vessels  were then cauterized at that time.  There appeared to be a large amount of thick musculature at the plantar foot which  appeared to be calcified and this was resected and passed off the operative site.  The deep fascia and subcutaneous tissue was then reapproximated well coapted with 3-0 Vicryl.  The skin was then reapproximated well coapted with skin stapler and 3-0 nylon in a combination of simple and horizontal mattress type stitching.  The pneumatic ankle tourniquet was deflated and a prompt hyperemic response was noted to dorsal and plantar flaps of the right foot.  Attention was then directed to the left foot.  2 wounds were present 1 underneath the second metatarsal and first metatarsal phalange joint area and the other underneath the fifth metatarsal base area.  Both wounds appear to be subcutaneous in nature with no obvious bone exposed at this time.  A large amount of hyperkeratotic buildup around the periphery of both wounds.  Wound beds appear to be fibrogranular with biofilm buildup.  Left plantar 1st/2nd metatarsal phalange joint ulceration predebridement measured approximately 2 cm x 1.5 cm x 0.2 cm, centrally near the second metatarsal phalange joint area appeared to be the deepest part of the lesion which appeared to probe close to bone but no obvious bone exposed at this time.  Wound debridement performed 100% excisional subcutaneous with 15 blade and tissue nipper to healthy bleeding tissue also removing all hyperkeratotic tissue, biofilm, and fibrous tissue present.  Postdebridement measurement same as predebridement.  Since the wound had been present for so long to both areas of the left foot a tissue biopsy was taken from the wound including the dermis and some of the subcutaneous fat.  This was sent off for pathology assessment.  The left plantar fifth metatarsal base ulceration predebridement measured approximately 1 x 0.7 x 0.3 cm, no obvious bone exposed at this time.  100% excisional subcutaneous wound debridement performed with 15 blade and tissue nipper to healthy bleeding tissue removing  hyperkeratotic tissue, biofilm, fibrous tissue.  Postdebridement measurement same as predebridement.    An Integra 2 x 2 graft was going to be used but unfortunately the graft that was brought today was not present within the package for some reason.  The representative then stated that they had a 4 x 5 graft or could switch to using an ACell with micro matrix type of material but recommended usage of the Integra due to the nature of the wound present.  Under the recommendation of the representative we decided to use the 4 x 5 Integra bilayer skin graft.  This graft was then cut to the size of both wounds and both wounds were covered with the graft and the graft was stapled intact.  The excessive collagen from the unused portion of the graft was removed from the silicone layer.  This was then packed into the right transmetatarsal amputation site area as well as into areas of depth of the wound to the left foot.  This used 100% of the product without waste.  Postoperative dressings were then applied consisting of Xeroform to the transmetatarsal amputation site and Adaptic to the left foot graft sites followed by 4 x 4 gauze, ABD pad, Kerlix, Ace wrap.  The patient tolerated the procedure and anesthesia well and was transferred to the recovery room in vital signs stable vascular status appearing to be intact to all toes left foot and transmetatarsal amputation site of the right foot.  Following a period of postoperative monitoring  the patient be discharged back to the inpatient room with the appropriate orders, instructions, medications.  Patient is to remain nonweightbearing for the most part at all times but may apply a little bit of weight to his heels for transfers but not to the balls of the feet.  Patient is to also keep dressings clean, dry, and intact and is to not manipulate the dressings.  Patient remains at high risk for limb loss to bilateral lower extremities.  Ultimately may need transmetatarsal  amputation in the future on the left foot but with tissue coverage now being an issue would like to try to get these wounds smaller before potentially attempting that if needed.  Long-term patient would likely benefit from going to the wound center for hyperbaric oxygen  type therapy and further management of chronic wounds if this does not heal.  COMPLICATIONS: None  CONDITION: Good, stable  Prentice Lamir, DPM

## 2024-06-29 NOTE — H&P (Signed)
 HISTORY AND PHYSICAL INTERVAL NOTE:  06/29/2024  8:16 AM  [name]  has presented today for surgery, with the diagnosis of right foot prior nonhealing transmetatarsal amputation site, chronic left foot ulcerations, uncontrolled DM2 with polyneuropathy.  The various methods of treatment have been discussed with the patient.  No guarantees were given.  After consideration of risks, benefits and other options for treatment, the patient has consented to surgery.  I have reviewed the patients' chart and labs.    Procedure: Right revision transmetatarsal amputation Left foot ulcer debridement with graft placement   A history and physical examination was performed in the hospital.  The patient was reexamined.  There have been no changes to this history and physical examination.  Prentice Azam, DPM

## 2024-06-29 NOTE — Progress Notes (Signed)
 Progress Note   Patient: [name] FMW:[id number] DOB: [birth year] DOA: 06/26/2024     3 DOS: the patient was seen and examined on 06/29/2024   Brief hospital course: Taken from H&P.  Elman [name] is a 58 y.o. African-American male with medical history significant for hypertension, dyslipidemia, tobacco abuse, stage IIIa chronic kidney disease, cocaine abuse, and type 2 diabetes mellitus, who presented to the emergency room with acute onset of right foot pain with drainage from a recent toe amputation wound of clear fluid.  No fever or chills.   On presentation stable vital, labs with hyperglycemia at 345, creatinine 1.42, albumin 2.8, leukocytosis at 12.3 and hemoglobin of 8.7.  Wound and blood cultures were obtained.  MRI of the foot with following results 1. Status post interval second through fifth ray amputation at the level of the mid metatarsal shafts. Associated soft tissue wound extending along the dorsal stump into the deep soft tissues overlying the second through fifth digit metatarsal transsection margins which demonstrate T2/STIR hyperintense marrow edema with corresponding T1 hypointensity. These findings are concerning for osteomyelitis of the remnant second through fifth metatarsals, although, recent postsurgical change can demonstrate a similar appearance. Cellulitis cannot be excluded. No drainable abscess. 2. Diffuse nonspecific increased T2 signal of the intrinsic musculature of the foot, myositis cannot be excluded. 3. Tenosynovitis of the posterior tibialis, flexor digitorum, and flexor hallucis longus tendons.  Podiatry was consulted and patient was started on broad-spectrum antibiotics with Zosyn  and vancomycin .  7/25: Vital stable, leukocytosis resolved, hemoglobin decreased to 7.7-all cell line decreased, creatinine with some improvement to 1.38, preliminary blood cultures negative in 12-hour, preliminary wound cultures with no organisms seen. Antibiotics switched  with meropenem  and linezolid  based on prior culture results and ID recommendations. Pending podiatry evaluation.  7/26: Vital stable 1 out of 4 blood culture bottles with MRSA, podiatry is planning TMA and debridement of left sided superficial ulcers on Sunday, 7/27.  Echocardiogram ordered. Improving renal function with stable hemoglobin at 7.7, recent anemia panel with some iron  deficiency and borderline B12 and folate, starting on supplement.  7/27: Vital stable, wound cultures growing Staph aureus, diphtheroid, rare Klebsiella pneumonia and rare procidentia. 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  Echocardiogram with no evidence of valvular disease on TTE, ID to decide whether need TEE or not at this time.  Assessment and Plan: * Acute osteomyelitis of right foot [facility]) Patient with history of recent multiple ray amputation with podiatry and now with wound dehiscence.  MRI concerning for osteo of the remaining bone versus postprocedural edema. Repeat wound cultures with knee 1/4 blood culture bottles with MRSA, Klebsiella pneumoniae and procidentia. S/p TMA and debridement of chronic left foot ulcers with skin graft today - Continue with meropenem  -Continue with daptomycin  -Continue with supportive care -PT/OT evaluation  Uncontrolled type 2 diabetes mellitus with hyperglycemia, with long-term current use of insulin  (HCC) - Continue with SSI - Will continue basal coverage. - Will hold off metformin . - Will continue Neurontin  for diabetic neuropathy.  MRSA bacteremia 1 out of 4 blood culture bottles with MRSA bacteremia -Linezolid  switched with IV daptomycin  by ID - TTE negative for any acute concern of vegetation-ID to decide whether he need TEE  Dyslipidemia - Will continue statin therapy.  Essential hypertension Blood pressure currently within goal -Continue home losartan    Subjective: Patient was seen in PACU after TMA and wound debridement.   No new concern.  Physical Exam: Vitals:   06/29/24 1037  06/29/24 1045 06/29/24 1100 06/29/24 1134  BP:  136/80 129/78 137/79  Pulse:  76 74 73  Resp:  16 14 18   Temp: (!) 97 F (36.1 C) (!) 97 F (36.1 C)  97.7 F (36.5 C)  TempSrc:    Oral  SpO2:  100% 98% 100%  Weight:      Height:       General.  Obese gentleman, in no acute distress. Pulmonary.  Lungs clear bilaterally, normal respiratory effort. CV.  Regular rate and rhythm, no JVD, rub or murmur. Abdomen.  Soft, nontender, nondistended, BS positive. CNS.  Alert and oriented .  No focal neurologic deficit. Extremities.  Bilateral feet with bandage and Ace wrap, no lower extremity edema. Psychiatry.  Judgment and insight appears normal.    Data Reviewed: Prior data reviewed  Family Communication: Discussed with patient  Disposition: Status is: Inpatient Remains inpatient appropriate because: Severity of illness  Planned Discharge Destination: Home  DVT prophylaxis.  Lovenox  Time spent: 50 minutes  This record has been created using Conservation officer, historic buildings. Errors have been sought and corrected,but may not always be located. Such creation errors do not reflect on the standard of care.   Author: Amaryllis Dare, MD 06/29/2024 1:29 PM  For on call review [email or web address].

## 2024-06-29 NOTE — Plan of Care (Signed)

## 2024-06-29 NOTE — TOC Progression Note (Signed)
 Transition of Care [facility]) - Progression Note    Patient Details  Name: [name] MRN: [id number] Date of Birth: June 10, 1966  Transition of Care [facility]) CM/SW Contact  Lorraine LILLETTE Fenton, LCSW Phone Number: 06/29/2024, 11:36 AM  Clinical Narrative:    CSW added to [place], pt to be reevaluated by OT PT Monday to plan for DC .  IPCM to follow.                      Expected Discharge Plan and Services                                               Social Drivers of Health (SDOH) Interventions SDOH Screenings   Food Insecurity: No Food Insecurity (06/26/2024)  Housing: Low Risk  (06/26/2024)  Transportation Needs: No Transportation Needs (06/26/2024)  Utilities: Not At Risk (06/26/2024)  Depression (PHQ2-9): Low Risk  (08/28/2023)  Financial Resource Strain: Patient Declined (06/28/2023)   Received from [facility] [employer]  Social Connections: Moderately Integrated (06/26/2024)  Tobacco Use: Medium Risk (06/26/2024)  Health Literacy: Adequate Health Literacy (05/01/2024)    Readmission Risk Interventions     No data to display

## 2024-06-29 NOTE — Transfer of Care (Signed)
 Immediate Anesthesia Transfer of Care Note  Patient: [name]  Procedure(s) Performed: AMPUTATION, FOOT, TRANSMETATARSAL (Right: Foot) IRRIGATION AND DEBRIDEMENT FOOT (Left)  Patient Location: PACU  Anesthesia Type:General  Level of Consciousness: drowsy  Airway & Oxygen  Therapy: Patient Spontanous Breathing and Patient connected to face mask oxygen   Post-op Assessment: Report given to RN, Post -op Vital signs reviewed and stable, and Patient moving all extremities  Post vital signs: Reviewed and stable  Last Vitals:  Vitals Value Taken Time  BP 130/81 06/29/24 10:37  Temp    Pulse 76 06/29/24 10:39  Resp 12 06/29/24 10:39  SpO2 100 % 06/29/24 10:39  Vitals shown include unfiled device data.  Last Pain:  Vitals:   06/29/24 0511  TempSrc:   PainSc: 0-No pain      Patients Stated Pain Goal: 1 (06/26/24 2113)  Complications: No notable events documented.

## 2024-06-29 NOTE — Assessment & Plan Note (Signed)
 Blood pressure currently within goal -Continue home losartan 

## 2024-06-29 NOTE — Progress Notes (Signed)
 RN called for STAT labs. Called floor phlebotomist, [place] at [phone] to make aware.

## 2024-06-29 NOTE — Anesthesia Postprocedure Evaluation (Signed)
 Anesthesia Post Note  Patient: [name]  Procedure(s) Performed: AMPUTATION, FOOT, TRANSMETATARSAL (Right: Foot) IRRIGATION AND DEBRIDEMENT FOOT (Left)  Patient location during evaluation: PACU Anesthesia Type: General Level of consciousness: awake and alert Pain management: pain level controlled Vital Signs Assessment: post-procedure vital signs reviewed and stable Respiratory status: spontaneous breathing, nonlabored ventilation and respiratory function stable Cardiovascular status: blood pressure returned to baseline and stable Postop Assessment: no apparent nausea or vomiting Anesthetic complications: no   No notable events documented.   Last Vitals:  Vitals:   06/29/24 1045 06/29/24 1100  BP: 136/80 129/78  Pulse: 76 74  Resp: 16 14  Temp: (!) 36.1 C   SpO2: 100% 98%    Last Pain:  Vitals:   06/29/24 1100  TempSrc:   PainSc: 0-No pain                 Fairy POUR Aslyn Cottman

## 2024-06-29 NOTE — Anesthesia Preprocedure Evaluation (Signed)
 Anesthesia Evaluation  Patient identified by MRN, date of birth, ID band Patient awake    Reviewed: Allergy & Precautions, NPO status , Patient's Chart, lab work & pertinent test results  History of Anesthesia Complications Negative for: history of anesthetic complications  Airway Mallampati: III  TM Distance: >3 FB Neck ROM: full    Dental  (+) Chipped   Pulmonary neg shortness of breath, former smoker   Pulmonary exam normal        Cardiovascular Exercise Tolerance: Good hypertension, (-) angina Normal cardiovascular exam     Neuro/Psych  PSYCHIATRIC DISORDERS      negative neurological ROS     GI/Hepatic negative GI ROS, Neg liver ROS,neg GERD  ,,  Endo/Other  negative endocrine ROSdiabetes    Renal/GU Renal disease     Musculoskeletal   Abdominal   Peds  Hematology  (+) Blood dyscrasia, anemia   Anesthesia Other Findings Past Medical History: No date: Amputation of right great toe (HCC) No date: Cellulitis and abscess of foot No date: CKD stage 3a, GFR 45-59 ml/min (HCC) No date: Cocaine abuse (HCC)     Comment:  + UDS on 05-12-24 No date: DM (diabetes mellitus), type 2 (HCC) 04/18/2024: ESBL (extended spectrum beta-lactamase) producing  bacteria infection No date: Hyperlipidemia No date: Hypertension No date: MRSA bacteremia No date: Normocytic anemia No date: Obesity No date: Tobacco abuse No date: Toe osteomyelitis, left (HCC) No date: Tuberculosis     Comment:  tested positive,treated with medications.  Past Surgical History: 03/26/2024: AMPUTATION TOE; Right     Comment:  Procedure: AMPUTATION, TOE;  Surgeon: Harden Jerona GAILS,               MD;  Location: [facility] OR;  Service: Orthopedics;  Laterality:              Right;  RIGHT GREAT TOE AMPUTATION 04/18/2024: BONE BIOPSY     Comment:  Procedure: BIOPSY, BONE (2ND METATARSAL);  Surgeon:               Ashley Soulier, DPM;  Location: [facility] ORS;   Service:               Orthopedics/Podiatry;; No date: FOOT SURGERY; Left 05/05/2022: I & D EXTREMITY; Left     Comment:  Procedure: LEFT FOOT DEBRIDEMENT;  Surgeon: Harden Jerona GAILS, MD;  Location: [facility] OR;  Service: Orthopedics;                Laterality: Left; 06/05/2024: INCISION AND DRAINAGE OF WOUND; Right     Comment:  Procedure: DELAYED PRIMARY CLOSURE OF RIGHT FOOT WOUND,               EXCISION OF DISTAL METATARSAL 2, 3, 4, and 5;  Surgeon:               Ashley Soulier, DPM;  Location: [facility] ORS;  Service:               Orthopedics/Podiatry;  Laterality: Right; 04/18/2024: IRRIGATION AND DEBRIDEMENT FOOT; Right     Comment:  Procedure: IRRIGATION AND DEBRIDEMENT FOOT, PLACEMENT OF              ANTIBIOTIC BEADS;  Surgeon: Ashley Soulier, DPM;                Location: [facility] ORS;  Service: Orthopedics/Podiatry;  Laterality: Right; 07/20/2022: MYRINGOTOMY WITH TUBE PLACEMENT; Bilateral     Comment:  Procedure: MYRINGOTOMY WITH TUBE PLACEMENT;  Surgeon:               Juengel, Paul, MD;  Location: [facility];                Service: ENT;  Laterality: Bilateral;  Diabetic No date: TONSILLECTOMY 04/23/2024: TRANSMETATARSAL AMPUTATION; Right     Comment:  Procedure: AMPUTATION, FOOT, TRANSMETATARSAL;  Surgeon:               Ashley Soulier, DPM;  Location: [facility] ORS;  Service:               Orthopedics/Podiatry;  Laterality: Right;  BMI    Body Mass Index: 32.28 kg/m      Reproductive/Obstetrics negative OB ROS                              Anesthesia Physical Anesthesia Plan  ASA: 3  Anesthesia Plan: General LMA   Post-op Pain Management:    Induction: Intravenous  PONV Risk Score and Plan: Dexamethasone , Ondansetron , Midazolam  and Treatment may vary due to age or medical condition  Airway Management Planned: LMA  Additional Equipment:   Intra-op Plan:   Post-operative Plan: Extubation in OR  Informed Consent: I  have reviewed the patients History and Physical, chart, labs and discussed the procedure including the risks, benefits and alternatives for the proposed anesthesia with the patient or authorized representative who has indicated his/her understanding and acceptance.     Dental Advisory Given  Plan Discussed with: Anesthesiologist, CRNA and Surgeon  Anesthesia Plan Comments: (Patient consented for risks of anesthesia including but not limited to:  - adverse reactions to medications - damage to eyes, teeth, lips or other oral mucosa - nerve damage due to positioning  - sore throat or hoarseness - Damage to heart, brain, nerves, lungs, other parts of body or loss of life  Patient voiced understanding and assent.)        Anesthesia Quick Evaluation

## 2024-06-30 ENCOUNTER — Encounter: Payer: Self-pay | Admitting: Podiatry

## 2024-06-30 DIAGNOSIS — M86171 Other acute osteomyelitis, right ankle and foot: Secondary | ICD-10-CM | POA: Diagnosis not present

## 2024-06-30 DIAGNOSIS — Z1612 Extended spectrum beta lactamase (ESBL) resistance: Secondary | ICD-10-CM | POA: Diagnosis not present

## 2024-06-30 DIAGNOSIS — I10 Essential (primary) hypertension: Secondary | ICD-10-CM | POA: Diagnosis not present

## 2024-06-30 DIAGNOSIS — E785 Hyperlipidemia, unspecified: Secondary | ICD-10-CM | POA: Diagnosis not present

## 2024-06-30 DIAGNOSIS — B9562 Methicillin resistant Staphylococcus aureus infection as the cause of diseases classified elsewhere: Secondary | ICD-10-CM | POA: Diagnosis not present

## 2024-06-30 DIAGNOSIS — R7881 Bacteremia: Secondary | ICD-10-CM | POA: Diagnosis not present

## 2024-06-30 DIAGNOSIS — E1165 Type 2 diabetes mellitus with hyperglycemia: Secondary | ICD-10-CM | POA: Diagnosis not present

## 2024-06-30 DIAGNOSIS — B961 Klebsiella pneumoniae [K. pneumoniae] as the cause of diseases classified elsewhere: Secondary | ICD-10-CM | POA: Diagnosis not present

## 2024-06-30 LAB — CBC
HCT: 25.7 % — ABNORMAL LOW (ref 39.0–52.0)
Hemoglobin: 8.1 g/dL — ABNORMAL LOW (ref 13.0–17.0)
MCH: 24.9 pg — ABNORMAL LOW (ref 26.0–34.0)
MCHC: 31.5 g/dL (ref 30.0–36.0)
MCV: 79.1 fL — ABNORMAL LOW (ref 80.0–100.0)
Platelets: 472 K/uL — ABNORMAL HIGH (ref 150–400)
RBC: 3.25 MIL/uL — ABNORMAL LOW (ref 4.22–5.81)
RDW: 14.6 % (ref 11.5–15.5)
WBC: 13.4 K/uL — ABNORMAL HIGH (ref 4.0–10.5)
nRBC: 0 % (ref 0.0–0.2)

## 2024-06-30 LAB — GLUCOSE, CAPILLARY
Glucose-Capillary: 193 mg/dL — ABNORMAL HIGH (ref 70–99)
Glucose-Capillary: 197 mg/dL — ABNORMAL HIGH (ref 70–99)
Glucose-Capillary: 232 mg/dL — ABNORMAL HIGH (ref 70–99)
Glucose-Capillary: 239 mg/dL — ABNORMAL HIGH (ref 70–99)
Glucose-Capillary: 309 mg/dL — ABNORMAL HIGH (ref 70–99)
Glucose-Capillary: 310 mg/dL — ABNORMAL HIGH (ref 70–99)
Glucose-Capillary: 357 mg/dL — ABNORMAL HIGH (ref 70–99)

## 2024-06-30 LAB — BASIC METABOLIC PANEL WITH GFR
Anion gap: 10 (ref 5–15)
BUN: 23 mg/dL — ABNORMAL HIGH (ref 6–20)
CO2: 26 mmol/L (ref 22–32)
Calcium: 8.7 mg/dL — ABNORMAL LOW (ref 8.9–10.3)
Chloride: 103 mmol/L (ref 98–111)
Creatinine, Ser: 1.29 mg/dL — ABNORMAL HIGH (ref 0.61–1.24)
GFR, Estimated: >60 mL/min (ref >60)
Glucose, Bld: 201 mg/dL — ABNORMAL HIGH (ref 70–99)
Potassium: 4.7 mmol/L (ref 3.5–5.1)
Sodium: 139 mmol/L (ref 135–145)

## 2024-06-30 MED ORDER — INSULIN NPH (HUMAN) (ISOPHANE) 100 UNIT/ML ~~LOC~~ SUSP
25.0000 [IU] | Freq: Two times a day (BID) | SUBCUTANEOUS | Status: DC
  Filled 2024-06-30 (×2): qty 10

## 2024-06-30 MED ORDER — INSULIN GLARGINE-YFGN 100 UNIT/ML ~~LOC~~ SOLN
25.0000 [IU] | Freq: Two times a day (BID) | SUBCUTANEOUS | Status: DC
  Administered 2024-06-30 – 2024-07-14 (×29): 25 [IU] via SUBCUTANEOUS
  Filled 2024-06-30 (×29): qty 0.25

## 2024-06-30 MED ORDER — INSULIN GLARGINE-YFGN 100 UNIT/ML ~~LOC~~ SOPN
25.0000 [IU] | PEN_INJECTOR | Freq: Two times a day (BID) | SUBCUTANEOUS | Status: DC
  Filled 2024-06-30: qty 3

## 2024-06-30 MED ORDER — INSULIN ASPART 100 UNIT/ML IJ SOLN
5.0000 [IU] | Freq: Three times a day (TID) | INTRAMUSCULAR | Status: DC
  Administered 2024-06-30 – 2024-07-07 (×22): 5 [IU] via SUBCUTANEOUS
  Filled 2024-06-30 (×22): qty 1

## 2024-06-30 NOTE — Progress Notes (Signed)
 Date of Admission:  06/26/2024     ID: [name] is a 58 y.o. male  Principal Problem:   Acute osteomyelitis of right foot (HCC) Active Problems:   MRSA bacteremia   Essential hypertension   Dyslipidemia   Uncontrolled type 2 diabetes mellitus with hyperglycemia, with long-term current use of insulin  (HCC)    Subjective: Doing well Sitting in recliner  Medications:   ascorbic acid   500 mg Oral Daily   cyanocobalamin   1,000 mcg Oral Daily   enoxaparin  (LOVENOX ) injection  0.5 mg/kg Subcutaneous Q24H   folic acid   1 mg Oral Daily   gabapentin   300 mg Oral BID   insulin  aspart  0-15 Units Subcutaneous TID WC   insulin  aspart  0-5 Units Subcutaneous QHS   insulin  aspart  5 Units Subcutaneous TID WC   insulin  NPH Human  25 Units Subcutaneous BID AC & HS   iron  polysaccharides  150 mg Oral Daily   losartan   25 mg Oral Daily   mupirocin  ointment  1 Application Nasal BID   nitroGLYCERIN   0.2 mg Transdermal Daily   [START ON 07/01/2024] Vitamin D  (Ergocalciferol )  50,000 Units Oral Q Tue    Objective: Vital signs in last 24 hours: Patient Vitals for the past 24 hrs:  BP Temp Temp src Pulse Resp SpO2  06/30/24 0721 (!) 150/85 97.7 F (36.5 C) Oral 81 16 99 %  06/30/24 0421 (!) 155/82 97.9 F (36.6 C) -- 85 16 100 %  06/30/24 0010 119/69 98.5 F (36.9 C) -- 93 14 98 %  06/29/24 2054 138/76 97.7 F (36.5 C) Axillary 84 14 99 %  06/29/24 1540 (!) 127/55 (!) 97.4 F (36.3 C) Oral 94 16 100 %     Lines and Device Date on insertion # of days DC  Engineer, technical sales     ETT       PHYSICAL EXAM:  General: Alert, cooperative, no distress, appears stated age.  Lungs: Clear to auscultation bilaterally. No Wheezing or Rhonchi. No rales. Heart: Regular rate and rhythm, no murmur, rub or gallop. Abdomen: Soft, non-tender,not distended. Bowel sounds normal. No masses Extremities:rt foot   Left foot has a graft- dressing note removed Skin: No rashes or  lesions. Or bruising Lymph: Cervical, supraclavicular normal. Neurologic: Grossly non-focal  Lab Results    Latest Ref Rng & Units 06/30/2024    4:22 AM 06/28/2024    5:36 AM 06/27/2024    5:02 AM  CBC  WBC 4.0 - 10.5 K/uL 13.4  8.4  10.0   Hemoglobin 13.0 - 17.0 g/dL 8.1  7.7  7.7   Hematocrit 39.0 - 52.0 % 25.7  25.3  24.8   Platelets 150 - 400 K/uL 472  437  404        Latest Ref Rng & Units 06/30/2024    4:22 AM 06/28/2024    5:36 AM 06/27/2024    5:02 AM  CMP  Glucose 70 - 99 mg/dL 798  840  859   BUN 6 - 20 mg/dL 23  16  17    Creatinine 0.61 - 1.24 mg/dL 8.70  8.72  8.61   Sodium 135 - 145 mmol/L 139  134  137   Potassium 3.5 - 5.1 mmol/L 4.7  4.3  4.1   Chloride 98 - 111 mmol/L 103  102  101   CO2 22 - 32 mmol/L 26  27  28    Calcium  8.9 - 10.3 mg/dL  8.7  8.2  8.3       Microbiology: [facility]- 06/26/24 MRSA 7/24 Superficial wound culture MRSA, ESBl kleb 06/29/24 Tissue culture sent during surgery pending Studies/Results: DG Foot Complete Left Result Date: 06/29/2024 CLINICAL DATA:  Postop EXAM: LEFT FOOT - COMPLETE 3+ VIEW COMPARISON:  03/25/2024.  MRI 06/21/2024 FINDINGS: Postoperative changes noted in the plantar soft tissues over the 2nd MTP joint and 4/5 metatarsals. No change in the appearance since prior study. No fracture, subluxation or dislocation. Chronic deformity of the distal 5th metatarsal and metatarsal head. Chronic periosteal reaction within the 5th metatarsal. IMPRESSION: Postoperative changes within the plantar soft tissues. No bony changes since prior study. Electronically Signed   By: Franky Crease M.D.   On: 06/29/2024 15:07   DG Foot Complete Right Result Date: 06/29/2024 CLINICAL DATA:  Postop EXAM: RIGHT FOOT COMPLETE - 3+ VIEW COMPARISON:  04/22/2024 FINDINGS: Interval transmetatarsal amputation of the remaining toes. Soft tissue gas noted, likely postoperative. No complicating feature. 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 No complicating feature. Electronically Signed   By: Franky Crease M.D.   On: 06/29/2024 15:04   DG MINI C-ARM IMAGE ONLY Result Date: 06/29/2024 There is no interpretation for this exam.  This order is for images obtained during a surgical procedure.  Please See Surgeries Tab for more information regarding the procedure.   ECHOCARDIOGRAM COMPLETE Result Date: 06/28/2024    ECHOCARDIOGRAM REPORT   Patient Name:   [name] Date of Exam: 06/28/2024 Medical Rec #:  [id number]  Height:       70.0 in Accession #:    [id number] Weight:       225.0 lb Date of Birth:  [birth year]  BSA:          2.194 m Patient Age:    58 years   BP:           118/64 mmHg Patient Gender: M          HR:           92 bpm. Exam Location:  [facility] Procedure: 2D Echo, Cardiac Doppler, Color Doppler and Strain Analysis (Both            Spectral and Color Flow Doppler were utilized during procedure). Indications:     Bacteremia R78.81  History:         Patient has prior history of Echocardiogram examinations, most                  recent 04/23/2024.  Sonographer:     Thedora Louder RDCS, FASE Referring Phys:  [id number] SUMAYYA AMIN Diagnosing Phys: Dwayne D Callwood MD  Sonographer Comments: Global longitudinal strain was attempted. IMPRESSIONS  1. Bicspid AoV can not be excluded , no evidence of AS or AI.  2. Consider TEE if any concerns.  3. Left ventricular ejection fraction, by estimation, is 60 to 65%. The left ventricle has normal function. The left ventricle has no regional wall motion abnormalities. There is moderate concentric left ventricular hypertrophy. Left ventricular diastolic parameters are consistent with Grade I diastolic dysfunction (impaired relaxation). The average left ventricular global longitudinal strain is 20.1 %. The global longitudinal strain is normal.  4. Right ventricular systolic function is normal. The right ventricular size is normal.  5. The mitral valve is normal in structure. Trivial mitral valve  regurgitation.  6. The aortic valve is normal in structure. Aortic valve regurgitation is trivial. FINDINGS  Left Ventricle: Left ventricular ejection fraction,  by estimation, is 60 to 65%. The left ventricle has normal function. The left ventricle has no regional wall motion abnormalities. The average left ventricular global longitudinal strain is 20.1 %. Strain was performed and the global longitudinal strain is normal. The left ventricular internal cavity size was normal in size. There is moderate concentric left ventricular hypertrophy. Left ventricular diastolic parameters are consistent with Grade I diastolic dysfunction (impaired relaxation). Right Ventricle: The right ventricular size is normal. No increase in right ventricular wall thickness. Right ventricular systolic function is normal. Left Atrium: Left atrial size was normal in size. Right Atrium: Right atrial size was normal in size. Pericardium: There is no evidence of pericardial effusion. Mitral Valve: The mitral valve is normal in structure. Trivial mitral valve regurgitation. Tricuspid Valve: The tricuspid valve is normal in structure. Tricuspid valve regurgitation is mild. Aortic Valve: The aortic valve is normal in structure. Aortic valve regurgitation is trivial. Aortic valve peak gradient measures 10.9 mmHg. Pulmonic Valve: The pulmonic valve was normal in structure. Pulmonic valve regurgitation is not visualized. Aorta: The ascending aorta was not well visualized. IAS/Shunts: No atrial level shunt detected by color flow Doppler. Additional Comments: Bicspid AoV can not be excluded , no evidence of AS or AI. Consider TEE if any concerns. 3D was performed not requiring image post processing on an independent workstation and was indeterminate.  LEFT VENTRICLE PLAX 2D LVIDd:         4.50 cm   Diastology LVIDs:         3.00 cm   LV e' medial:    10.10 cm/s LV PW:         1.70 cm   LV E/e' medial:  10.1 LV IVS:        1.40 cm   LV e' lateral:    7.40 cm/s LVOT diam:     2.40 cm   LV E/e' lateral: 13.8 LV SV:         111 LV SV Index:   51        2D Longitudinal Strain LVOT Area:     4.52 cm  2D Strain GLS (A4C):   20.5 %                          2D Strain GLS (A3C):   22.9 %                          2D Strain GLS (A2C):   17.0 %                          2D Strain GLS Avg:     20.1 % RIGHT VENTRICLE RV Basal diam:  2.10 cm RV S prime:     17.10 cm/s TAPSE (M-mode): 2.5 cm LEFT ATRIUM             Index        RIGHT ATRIUM           Index LA diam:        3.70 cm 1.69 cm/m   RA Area:     11.70 cm LA Vol (A2C):   62.2 ml 28.34 ml/m  RA Volume:   22.80 ml  10.39 ml/m LA Vol (A4C):   47.1 ml 21.46 ml/m LA Biplane Vol: 55.5 ml 25.29 ml/m  AORTIC VALVE  PULMONIC VALVE AV Area (Vmax): 3.32 cm     PV Vmax:        1.14 m/s AV Vmax:        165.00 cm/s  PV Peak grad:   5.2 mmHg AV Peak Grad:   10.9 mmHg    RVOT Peak grad: 5 mmHg LVOT Vmax:      121.00 cm/s LVOT Vmean:     82.900 cm/s LVOT VTI:       0.245 m  AORTA Ao Root diam: 3.90 cm Ao Asc diam:  3.60 cm MITRAL VALVE MV Area (PHT): 4.01 cm     SHUNTS MV Decel Time: 189 msec     Systemic VTI:  0.24 m MV E velocity: 102.00 cm/s  Systemic Diam: 2.40 cm MV A velocity: 110.00 cm/s MV E/A ratio:  0.93 Dwayne D Callwood MD Electronically signed by Cara JONETTA Lovelace MD Signature Date/Time: 06/28/2024/3:31:43 PM    Final      Assessment/Plan:  MRSA bacteremia- secondary to rt foot infection recurrent 2 d echo N Needs TEE On daptomycin      Infection rt TMA-  polymicrobial ESBL klebsiella, MRSA and dipheroids in superficial culture -underlying  osteomyelitis On meropenem  s/p revision TMA t Surgical culture and bone pathology sent   DM   Neuropathy     Lft foot ulcer chronic- had graft placement on 7/27     Pt needs to go to SNF to get IV and stay off his feet so that both his feet wounds can heal.  Discussed the management with the care team

## 2024-06-30 NOTE — Consult Note (Signed)
 PODIATRY / FOOT AND ANKLE SURGERY PROGRESS NOTE  Requesting Physician: Dr. Lawence  Reason for consult: Foot wounds bilaterally   HPI: Corgan Mormile is a 58 y.o. male who presents resting in bedside chair comfortably status post 1 day right transmetatarsal amputation revision and left foot wound debridements with skin graft placement.  Dressings appear to be clean, dry, and intact bilateral lower extremities.  Patient has remained nonweightbearing since the procedure.  He notes minimal to no pain today overall.  PMHx:  Past Medical History:  Diagnosis Date   Amputation of right great toe (HCC)    Cellulitis and abscess of foot    CKD stage 3a, GFR 45-59 ml/min (HCC)    Cocaine abuse (HCC)    + UDS on 05-12-24   DM (diabetes mellitus), type 2 (HCC)    ESBL (extended spectrum beta-lactamase) producing bacteria infection 04/18/2024   Hyperlipidemia    Hypertension    MRSA bacteremia    Normocytic anemia    Obesity    Tobacco abuse    Toe osteomyelitis, left (HCC)    Tuberculosis    tested positive,treated with medications.    Surgical Hx:  Past Surgical History:  Procedure Laterality Date   AMPUTATION TOE Right 03/26/2024   Procedure: AMPUTATION, TOE;  Surgeon: Harden Jerona GAILS, MD;  Location: [facility] OR;  Service: Orthopedics;  Laterality: Right;  RIGHT GREAT TOE AMPUTATION   BONE BIOPSY  04/18/2024   Procedure: BIOPSY, BONE (2ND METATARSAL);  Surgeon: Ashley Soulier, DPM;  Location: [facility] ORS;  Service: Orthopedics/Podiatry;;   FOOT SURGERY Left    I & D EXTREMITY Left 05/05/2022   Procedure: LEFT FOOT DEBRIDEMENT;  Surgeon: Harden Jerona GAILS, MD;  Location: [facility] OR;  Service: Orthopedics;  Laterality: Left;   INCISION AND DRAINAGE OF WOUND Right 06/05/2024   Procedure: DELAYED PRIMARY CLOSURE OF RIGHT FOOT WOUND, EXCISION OF DISTAL METATARSAL 2, 3, 4, and 5;  Surgeon: Ashley Soulier, DPM;  Location: [facility] ORS;  Service: Orthopedics/Podiatry;  Laterality: Right;   IRRIGATION AND DEBRIDEMENT FOOT Right  04/18/2024   Procedure: IRRIGATION AND DEBRIDEMENT FOOT, PLACEMENT OF ANTIBIOTIC BEADS;  Surgeon: Ashley Soulier, DPM;  Location: [facility] ORS;  Service: Orthopedics/Podiatry;  Laterality: Right;   IRRIGATION AND DEBRIDEMENT FOOT Left 06/29/2024   Procedure: IRRIGATION AND DEBRIDEMENT FOOT;  Surgeon: Lennie Barter, DPM;  Location: [facility] ORS;  Service: Orthopedics/Podiatry;  Laterality: Left;   MYRINGOTOMY WITH TUBE PLACEMENT Bilateral 07/20/2022   Procedure: MYRINGOTOMY WITH TUBE PLACEMENT;  Surgeon: Juengel, Paul, MD;  Location: [facility];  Service: ENT;  Laterality: Bilateral;  Diabetic   TONSILLECTOMY     TRANSMETATARSAL AMPUTATION Right 04/23/2024   Procedure: AMPUTATION, FOOT, TRANSMETATARSAL;  Surgeon: Ashley Soulier, DPM;  Location: [facility] ORS;  Service: Orthopedics/Podiatry;  Laterality: Right;   TRANSMETATARSAL AMPUTATION Right 06/29/2024   Procedure: AMPUTATION, FOOT, TRANSMETATARSAL;  Surgeon: Lennie Barter, DPM;  Location: [facility] ORS;  Service: Orthopedics/Podiatry;  Laterality: Right;  RIGHT TRANSMETATARSAL AMPUTATION REVISION    FHx:  Family History  Problem Relation Age of Onset   Colon cancer Neg Hx    Colon polyps Neg Hx    Crohn's disease Neg Hx    Esophageal cancer Neg Hx    Rectal cancer Neg Hx    Stomach cancer Neg Hx    Ulcerative colitis Neg Hx     Social History:  reports that he quit smoking about 21 months ago. His smoking use included cigarettes. He started smoking about 26 years ago. He has a 6.3 pack-year smoking  history. He has never used smokeless tobacco. He reports that he does not drink alcohol and does not use drugs.  Allergies: No Known Allergies  Medications Prior to Admission  Medication Sig Dispense Refill   ascorbic acid  (VITAMIN C ) 500 MG tablet Take 1 tablet (500 mg total) by mouth daily. 30 tablet 2   bisacodyl  (DULCOLAX) 5 MG EC tablet Take 2 tablets (10 mg total) by mouth at bedtime as needed for moderate constipation. 30 tablet 0    chlorhexidine  (HIBICLENS ) 4 % external liquid Apply 15 mLs (1 Application total) topically as directed for 30 doses. Use as directed daily for 5 days every other week for 6 weeks. 946 mL 1   cyanocobalamin  1000 MCG tablet Take 1 tablet (1,000 mcg total) by mouth daily. 90 tablet 0   gabapentin  (NEURONTIN ) 300 MG capsule Take 1 capsule (300 mg total) by mouth at bedtime. 30 capsule 3   insulin  aspart (NOVOLOG ) 100 UNIT/ML FlexPen Inject 0-15 Units into the skin 3 (three) times daily before meals.  Correction coverage: Moderate (average weight, post-op) CBG 70 - 120: 0 units CBG 121 - 150: 2 units CBG 151 - 200: 3 units CBG 201 - 250: 5 units CBG 251 - 300: 8 units CBG 301 - 350: 11 units CBG 351 - 400: 15 units CBG > 400: call MD 15 mL 11   Insulin  NPH, Human,, Isophane, (HUMULIN  N KWIKPEN) 100 UNIT/ML Kiwkpen Inject 22 Units into the skin 2 (two) times daily. 30 mL 5   iron  polysaccharides (NIFEREX) 150 MG capsule Take 1 capsule (150 mg total) by mouth daily. 90 capsule 0   losartan  (COZAAR ) 25 MG tablet Take 1 tablet (25 mg total) by mouth daily. (Patient taking differently: Take 25 mg by mouth every morning.) 30 tablet 11   metFORMIN  (GLUCOPHAGE -XR) 500 MG 24 hr tablet Take 2 tablets (1,000 mg total) by mouth 2 (two) times daily with a meal. 120 tablet 1   mupirocin  ointment (BACTROBAN ) 2 % Use as directed into the nose  2 times daily for 5 days every other week for 6 weeks. 60 g 0   oxyCODONE -acetaminophen  (PERCOCET) 5-325 MG tablet Take 1-2 tablets by mouth every 6 (six) hours as needed for severe pain (pain score 7-10). Max 6 tabs per day 30 tablet 0   polyethylene glycol powder (GLYCOLAX /MIRALAX ) 17 GM/SCOOP powder Take 17 g by mouth 2 (two) times daily. Mix as directed. (Patient taking differently: Take 17 g by mouth 2 (two) times daily as needed for mild constipation. Mix as directed.) 238 g 0   Vitamin D , Ergocalciferol , (DRISDOL ) 1.25 MG (50000 UNIT) CAPS capsule Take 1 capsule (50,000  Units total) by mouth every 7 (seven) days. (Patient taking differently: Take 50,000 Units by mouth every Tuesday.) 12 capsule 0   Accu-Chek Softclix Lancets lancets Use to check blood sugar 3 times daily. 100 each 6   Blood Glucose Monitoring Suppl (ACCU-CHEK GUIDE) w/Device KIT Use to check blood sugar 3 times daily. 1 kit 0   Continuous Blood Gluc Transmit (DEXCOM G6 TRANSMITTER) MISC Use to check blood sugar three times daily. Change transmitter once every 909 days. E11.69 (Patient not taking: Reported on 06/05/2024) 1 each 1   doxycycline  (VIBRA -TABS) 100 MG tablet Take 1 tablet (100 mg total) by mouth 2 (two) times daily. (Patient not taking: Reported on 06/05/2024) 28 tablet 0   glucose blood (ACCU-CHEK GUIDE TEST) test strip Use to check blood sugar 3 times daily. 100 each 6  Insulin  Pen Needle 32G X 4 MM MISC Use as directed to inject insulin  up to 4 times daily. 100 each 0   methocarbamol  (ROBAXIN ) 750 MG tablet Take 1 tablet (750 mg total) by mouth every 6 (six) hours as needed for muscle spasms. (Patient not taking: Reported on 06/27/2024) 45 tablet 0   nitroGLYCERIN  (NITRODUR - DOSED IN MG/24 HR) 0.2 mg/hr patch Place 1 patch (0.2 mg total) onto the skin daily. 30 patch 12    Physical Exam: General: Alert and oriented.  No apparent distress.  Vascular: DP/PT pulses palpable bilateral.  Neuro: Light touch sensation absent to bilateral lower extremities.  Derm: Right transmetatarsal amputation site appears to be well coapted with sutures/staples intact, no obvious dehiscence present today, reduction in erythema and edema present overall, no drainage.    Did not remove dressing to the left foot today due to skin graft placement yesterday.  MSK: Right transmetatarsal amputation  Results for orders placed or performed during the hospital encounter of 06/26/24 (from the past 48 hours)  Glucose, capillary     Status: Abnormal   Collection Time: 06/28/24  4:21 PM  Result Value Ref Range    Glucose-Capillary 132 (H) 70 - 99 mg/dL    Comment: Glucose reference range applies only to samples taken after fasting for at least 8 hours.  Glucose, capillary     Status: Abnormal   Collection Time: 06/28/24 10:45 PM  Result Value Ref Range   Glucose-Capillary 189 (H) 70 - 99 mg/dL    Comment: Glucose reference range applies only to samples taken after fasting for at least 8 hours.   Comment 1 Notify RN   Glucose, capillary     Status: Abnormal   Collection Time: 06/29/24 12:05 AM  Result Value Ref Range   Glucose-Capillary 188 (H) 70 - 99 mg/dL    Comment: Glucose reference range applies only to samples taken after fasting for at least 8 hours.  Glucose, capillary     Status: Abnormal   Collection Time: 06/29/24  4:05 AM  Result Value Ref Range   Glucose-Capillary 167 (H) 70 - 99 mg/dL    Comment: Glucose reference range applies only to samples taken after fasting for at least 8 hours.  Type and screen [facility]     Status: None   Collection Time: 06/29/24  7:52 AM  Result Value Ref Range   ABO/RH(D) B POS    Antibody Screen NEG    Sample Expiration      07/02/2024,2359 Performed at [facility], [address] [place], [place] [address]   Aerobic/Anaerobic Culture w Gram Stain (surgical/deep wound)     Status: None (Preliminary result)   Collection Time: 06/29/24 10:09 AM   Specimen: PATH Bone biopsy; Tissue  Result Value Ref Range   Specimen Description      TISSUE Performed at [facility], [address] [place], [place] [address]    Special Requests      BONE BOX Performed at [facility], [address] [place], [place] [address]    Gram Stain NO WBC SEEN NO ORGANISMS SEEN     Culture      CULTURE REINCUBATED FOR BETTER GROWTH Performed at [facility], [address], [place], [place] [address]    Report Status PENDING   Glucose, capillary     Status: Abnormal   Collection Time: 06/29/24 10:43  AM  Result Value Ref Range   Glucose-Capillary 133 (H)  70 - 99 mg/dL    Comment: Glucose reference range applies only to samples taken after fasting for at least 8 hours.  Glucose, capillary     Status: Abnormal   Collection Time: 06/29/24  4:29 PM  Result Value Ref Range   Glucose-Capillary 506 (HH) 70 - 99 mg/dL    Comment: Glucose reference range applies only to samples taken after fasting for at least 8 hours.   Comment 1 Document in Chart   Glucose, capillary     Status: Abnormal   Collection Time: 06/29/24  4:31 PM  Result Value Ref Range   Glucose-Capillary 494 (H) 70 - 99 mg/dL    Comment: Glucose reference range applies only to samples taken after fasting for at least 8 hours.  Glucose, capillary     Status: Abnormal   Collection Time: 06/29/24  6:49 PM  Result Value Ref Range   Glucose-Capillary 494 (H) 70 - 99 mg/dL    Comment: Glucose reference range applies only to samples taken after fasting for at least 8 hours.  Glucose, capillary     Status: Abnormal   Collection Time: 06/29/24  8:53 PM  Result Value Ref Range   Glucose-Capillary 444 (H) 70 - 99 mg/dL    Comment: Glucose reference range applies only to samples taken after fasting for at least 8 hours.   Comment 1 Notify RN   Glucose, capillary     Status: Abnormal   Collection Time: 06/30/24 12:11 AM  Result Value Ref Range   Glucose-Capillary 310 (H) 70 - 99 mg/dL    Comment: Glucose reference range applies only to samples taken after fasting for at least 8 hours.   Comment 1 Notify RN   CBC     Status: Abnormal   Collection Time: 06/30/24  4:22 AM  Result Value Ref Range   WBC 13.4 (H) 4.0 - 10.5 K/uL   RBC 3.25 (L) 4.22 - 5.81 MIL/uL   Hemoglobin 8.1 (L) 13.0 - 17.0 g/dL   HCT 74.2 (L) 60.9 - 47.9 %   MCV 79.1 (L) 80.0 - 100.0 fL   MCH 24.9 (L) 26.0 - 34.0 pg   MCHC 31.5 30.0 - 36.0 g/dL   RDW 85.3 88.4 - 84.4 %   Platelets 472 (H) 150 - 400 K/uL   nRBC 0.0 0.0 - 0.2 %    Comment: Performed at [facility], [address] [place], [place] [address]  Basic metabolic panel with GFR     Status: Abnormal   Collection Time: 06/30/24  4:22 AM  Result Value Ref Range   Sodium 139 135 - 145 mmol/L   Potassium 4.7 3.5 - 5.1 mmol/L   Chloride 103 98 - 111 mmol/L   CO2 26 22 - 32 mmol/L   Glucose, Bld 201 (H) 70 - 99 mg/dL    Comment: Glucose reference range applies only to samples taken after fasting for at least 8 hours.   BUN 23 (H) 6 - 20 mg/dL   Creatinine, Ser 8.70 (H) 0.61 - 1.24 mg/dL   Calcium  8.7 (L) 8.9 - 10.3 mg/dL   GFR, Estimated >39 >39 mL/min    Comment: (NOTE) Calculated using the CKD-EPI Creatinine Equation (2021)    Anion gap 10 5 - 15    Comment: Performed at [facility], [address] [place], [place] [address]  Glucose, capillary     Status: Abnormal   Collection Time: 06/30/24  4:25 AM  Result Value Ref Range  Glucose-Capillary 193 (H) 70 - 99 mg/dL    Comment: Glucose reference range applies only to samples taken after fasting for at least 8 hours.   Comment 1 Notify RN   Glucose, capillary     Status: Abnormal   Collection Time: 06/30/24  7:19 AM  Result Value Ref Range   Glucose-Capillary 232 (H) 70 - 99 mg/dL    Comment: Glucose reference range applies only to samples taken after fasting for at least 8 hours.  Glucose, capillary     Status: Abnormal   Collection Time: 06/30/24 12:46 PM  Result Value Ref Range   Glucose-Capillary 309 (H) 70 - 99 mg/dL    Comment: Glucose reference range applies only to samples taken after fasting for at least 8 hours.   DG Foot Complete Left Result Date: 06/29/2024 CLINICAL DATA:  Postop EXAM: LEFT FOOT - COMPLETE 3+ VIEW COMPARISON:  03/25/2024.  MRI 06/21/2024 FINDINGS: Postoperative changes noted in the plantar soft tissues over the 2nd MTP joint and 4/5 metatarsals. No change in the appearance since prior study. No fracture, subluxation or dislocation. Chronic deformity of the distal 5th metatarsal  and metatarsal head. Chronic periosteal reaction within the 5th metatarsal. IMPRESSION: Postoperative changes within the plantar soft tissues. No bony changes since prior study. Electronically Signed   By: Franky Crease M.D.   On: 06/29/2024 15:07   DG Foot Complete Right Result Date: 06/29/2024 CLINICAL DATA:  Postop EXAM: RIGHT FOOT COMPLETE - 3+ VIEW COMPARISON:  04/22/2024 FINDINGS: Interval transmetatarsal amputation of the remaining toes. Soft tissue gas noted, likely postoperative. No complicating feature. IMPRESSION: Transmetatarsal amputations of the remaining 2nd through 5th toes. No complicating feature. Electronically Signed   By: Franky Crease M.D.   On: 06/29/2024 15:04   DG MINI C-ARM IMAGE ONLY Result Date: 06/29/2024 There is no interpretation for this exam.  This order is for images obtained during a surgical procedure.  Please See Surgeries Tab for more information regarding the procedure.   ECHOCARDIOGRAM COMPLETE Result Date: 06/28/2024    ECHOCARDIOGRAM REPORT   Patient Name:   CILLIAN GWINNER Date of Exam: 06/28/2024 Medical Rec #:  [id number]  Height:       70.0 in Accession #:    [id number] Weight:       225.0 lb Date of Birth:  1966-01-16  BSA:          2.194 m Patient Age:    58 years   BP:           118/64 mmHg Patient Gender: M          HR:           92 bpm. Exam Location:  [facility] Procedure: 2D Echo, Cardiac Doppler, Color Doppler and Strain Analysis (Both            Spectral and Color Flow Doppler were utilized during procedure). Indications:     Bacteremia R78.81  History:         Patient has prior history of Echocardiogram examinations, most                  recent 04/23/2024.  Sonographer:     Thedora Louder RDCS, FASE Referring Phys:  [id number] SUMAYYA AMIN Diagnosing Phys: Dwayne D Callwood MD  Sonographer Comments: Global longitudinal strain was attempted. IMPRESSIONS  1. Bicspid AoV can not be excluded , no evidence of AS or AI.  2. Consider TEE if any concerns.  3. Left  ventricular ejection fraction, by estimation,  is 60 to 65%. The left ventricle has normal function. The left ventricle has no regional wall motion abnormalities. There is moderate concentric left ventricular hypertrophy. Left ventricular diastolic parameters are consistent with Grade I diastolic dysfunction (impaired relaxation). The average left ventricular global longitudinal strain is 20.1 %. The global longitudinal strain is normal.  4. Right ventricular systolic function is normal. The right ventricular size is normal.  5. The mitral valve is normal in structure. Trivial mitral valve regurgitation.  6. The aortic valve is normal in structure. Aortic valve regurgitation is trivial. FINDINGS  Left Ventricle: Left ventricular ejection fraction, by estimation, is 60 to 65%. The left ventricle has normal function. The left ventricle has no regional wall motion abnormalities. The average left ventricular global longitudinal strain is 20.1 %. Strain was performed and the global longitudinal strain is normal. The left ventricular internal cavity size was normal in size. There is moderate concentric left ventricular hypertrophy. Left ventricular diastolic parameters are consistent with Grade I diastolic dysfunction (impaired relaxation). Right Ventricle: The right ventricular size is normal. No increase in right ventricular wall thickness. Right ventricular systolic function is normal. Left Atrium: Left atrial size was normal in size. Right Atrium: Right atrial size was normal in size. Pericardium: There is no evidence of pericardial effusion. Mitral Valve: The mitral valve is normal in structure. Trivial mitral valve regurgitation. Tricuspid Valve: The tricuspid valve is normal in structure. Tricuspid valve regurgitation is mild. Aortic Valve: The aortic valve is normal in structure. Aortic valve regurgitation is trivial. Aortic valve peak gradient measures 10.9 mmHg. Pulmonic Valve: The pulmonic valve was normal in  structure. Pulmonic valve regurgitation is not visualized. Aorta: The ascending aorta was not well visualized. IAS/Shunts: No atrial level shunt detected by color flow Doppler. Additional Comments: Bicspid AoV can not be excluded , no evidence of AS or AI. Consider TEE if any concerns. 3D was performed not requiring image post processing on an independent workstation and was indeterminate.  LEFT VENTRICLE PLAX 2D LVIDd:         4.50 cm   Diastology LVIDs:         3.00 cm   LV e' medial:    10.10 cm/s LV PW:         1.70 cm   LV E/e' medial:  10.1 LV IVS:        1.40 cm   LV e' lateral:   7.40 cm/s LVOT diam:     2.40 cm   LV E/e' lateral: 13.8 LV SV:         111 LV SV Index:   51        2D Longitudinal Strain LVOT Area:     4.52 cm  2D Strain GLS (A4C):   20.5 %                          2D Strain GLS (A3C):   22.9 %                          2D Strain GLS (A2C):   17.0 %                          2D Strain GLS Avg:     20.1 % RIGHT VENTRICLE RV Basal diam:  2.10 cm RV S prime:     17.10 cm/s TAPSE (M-mode): 2.5 cm LEFT ATRIUM  Index        RIGHT ATRIUM           Index LA diam:        3.70 cm 1.69 cm/m   RA Area:     11.70 cm LA Vol (A2C):   62.2 ml 28.34 ml/m  RA Volume:   22.80 ml  10.39 ml/m LA Vol (A4C):   47.1 ml 21.46 ml/m LA Biplane Vol: 55.5 ml 25.29 ml/m  AORTIC VALVE                 PULMONIC VALVE AV Area (Vmax): 3.32 cm     PV Vmax:        1.14 m/s AV Vmax:        165.00 cm/s  PV Peak grad:   5.2 mmHg AV Peak Grad:   10.9 mmHg    RVOT Peak grad: 5 mmHg LVOT Vmax:      121.00 cm/s LVOT Vmean:     82.900 cm/s LVOT VTI:       0.245 m  AORTA Ao Root diam: 3.90 cm Ao Asc diam:  3.60 cm MITRAL VALVE MV Area (PHT): 4.01 cm     SHUNTS MV Decel Time: 189 msec     Systemic VTI:  0.24 m MV E velocity: 102.00 cm/s  Systemic Diam: 2.40 cm MV A velocity: 110.00 cm/s MV E/A ratio:  0.93 Dwayne D Callwood MD Electronically signed by Cara JONETTA Lovelace MD Signature Date/Time: 06/28/2024/3:31:43 PM    Final      Blood pressure (!) 150/85, pulse 81, temperature 97.7 F (36.5 C), temperature source Oral, resp. rate 16, height 5' 10 (1.778 m), weight 102.1 kg, SpO2 99%.  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 Diabetes type 2 with neuropathy  Plan - Patient seen and examined - Postoperative x-ray imaging and MRI imaging of left foot reviewed and discussed with patient in detail.  Discussed with patient that left foot appears to have chronic changes present which may be related to the chronic dislocation of digits at the metatarsal phalangeal joint levels.  Both wounds present to the left foot and surgery did not have any bone exposed at this time.  No signs of infection were present to the left foot overall. -Incision to the right foot appears to be well coapted with sutures/staples intact with no obvious evidence of infection present today.  Wound cultures and path pending still.  Appreciate medicine/ID recommendations for antibiotic therapy. - Skin biopsy was taken of one of the wounds to the left plantar foot at the time of surgery due to the chronic nature of his wounds to rule out any sort of cancerous type origin.  Believe that the wounds are purely pressure related to patient's increased plantar pressure from toe dislocations.  Discussed long-term if wounds do not heal to the left foot could consider transmetatarsal amputation on this side as well or consider pan metatarsal head resections with gastroc or Achilles lengthening.  Overall patient is a poor surgical candidate due to history of uncontrolled diabetes with neuropathy. - Redressed right foot today with Xeroform to the incision line followed by 4 x 4 gauze, ABD, Kerlix, Ace wrap.  Left foot dressing Intact.  Both dressings  can be left clean and dry for the next week. - Continue nonweightbearing at all times to bilateral lower  extremities.  Stressed importance.  May use heel for transfers only in postop shoes but should not be putting full pressure on the feet. - Discussed with patient would recommend skilled nursing rehab as if patient goes home and he is likely to walk on his feet and end up with postoperative complications at further.  This is already been proven as he has been through surgery before and put weight on this foot which is likely caused his wounds to not heal as well.  Patient has multifactorial reasons as to why the wound would not heal and this is not the only 1.  Still recommend SNF if possible.  Podiatry team to sign off at this time.  If patient is going to be in the hospital for several more days he may come back by at some point later in the week for dressing change again.  Patient should follow-up in outpatient clinic within 1 week of discharge date.  Prentice Jakevious, DPM 06/30/2024, 1:50 PM

## 2024-06-30 NOTE — Progress Notes (Signed)
 Progress Note   Patient: [name] FMW:[id number] DOB: 08/05/1966 DOA: 06/26/2024     4 DOS: the patient was seen and examined on 06/30/2024   Brief hospital course: Taken from H&P.  [name] is a 58 y.o. African-American male with medical history significant for hypertension, dyslipidemia, tobacco abuse, stage IIIa chronic kidney disease, cocaine abuse, and type 2 diabetes mellitus, who presented to the emergency room with acute onset of right foot pain with drainage from a recent toe amputation wound of clear fluid.  No fever or chills.   On presentation stable vital, labs with hyperglycemia at 345, creatinine 1.42, albumin 2.8, leukocytosis at 12.3 and hemoglobin of 8.7.  Wound and blood cultures were obtained.  MRI of the foot with following results 1. Status post interval second through fifth ray amputation at the level of the mid metatarsal shafts. Associated soft tissue wound extending along the dorsal stump into the deep soft tissues overlying the second through fifth digit metatarsal transsection margins which demonstrate T2/STIR hyperintense marrow edema with corresponding T1 hypointensity. These findings are concerning for osteomyelitis of the remnant second through fifth metatarsals, although, recent postsurgical change can demonstrate a similar appearance. Cellulitis cannot be excluded. No drainable abscess. 2. Diffuse nonspecific increased T2 signal of the intrinsic musculature of the foot, myositis cannot be excluded. 3. Tenosynovitis of the posterior tibialis, flexor digitorum, and flexor hallucis longus tendons.  Podiatry was consulted and patient was started on broad-spectrum antibiotics with Zosyn  and vancomycin .  7/25: Vital stable, leukocytosis resolved, hemoglobin decreased to 7.7-all cell line decreased, creatinine with some improvement to 1.38, preliminary blood cultures negative in 12-hour, preliminary wound cultures with no organisms seen. Antibiotics switched  with meropenem  and linezolid  based on prior culture results and ID recommendations. Pending podiatry evaluation.  7/26: Vital stable 1 out of 4 blood culture bottles with MRSA, podiatry is planning TMA and debridement of left sided superficial ulcers on Sunday, 7/27.  Echocardiogram ordered. Improving renal function with stable hemoglobin at 7.7, recent anemia panel with some iron  deficiency and borderline B12 and folate, starting on supplement.  7/27: Vital stable, wound cultures growing Staph aureus, diphtheroid, rare Klebsiella pneumonia and rare procidentia. 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  Echocardiogram with no evidence of valvular disease on TTE, ID to decide whether need TEE or not at this time.  7/28: Hemodynamically stable, CBG elevated so increasing the dose of Semglee  and adding mealtime coverage.  Patient need TEE per ID-cardiology was consulted.  Patient to remain nonweightbearing on both extremities except heels. Do not remove left foot dressing due to skin graft per podiatry. They change right foot dressing today. Pending PT evaluation-likely will need SNF as he lives alone   Assessment and Plan: * Acute osteomyelitis of right foot [facility]) Patient with history of recent multiple ray amputation with podiatry and now with wound dehiscence.  MRI concerning for osteo of the remaining bone versus postprocedural edema. Repeat wound cultures with knee 1/4 blood culture bottles with MRSA, Klebsiella pneumoniae and procidentia. S/p TMA and debridement of chronic left foot ulcers with skin graft on 7/27 - Continue with meropenem  -Continue with daptomycin  -Continue with supportive care -PT/OT evaluation  Uncontrolled type 2 diabetes mellitus with hyperglycemia, with long-term current use of insulin  (HCC) CBG elevated - Continue with SSI - Will continue basal coverage-increasing the dose of semglee  -Add mealtime coverage of 5 units - Will hold off  metformin . - Will continue Neurontin  for diabetic neuropathy.  MRSA  bacteremia 1 out of 4 blood culture bottles with MRSA bacteremia -Linezolid  switched with IV daptomycin  by ID - TTE negative for any acute concern of vegetation - Cardiology was consulted for TEE  Dyslipidemia - Will continue statin therapy.  Essential hypertension Blood pressure currently within goal -Continue home losartan    Subjective: Patient was sitting in chair when seen today.  Denies any significant pain.  Physical Exam: Vitals:   06/29/24 2054 06/30/24 0010 06/30/24 0421 06/30/24 0721  BP: 138/76 119/69 (!) 155/82 (!) 150/85  Pulse: 84 93 85 81  Resp: 14 14 16 16   Temp: 97.7 F (36.5 C) 98.5 F (36.9 C) 97.9 F (36.6 C) 97.7 F (36.5 C)  TempSrc: Axillary   Oral  SpO2: 99% 98% 100% 99%  Weight:      Height:       General. Obese gentleman, in no acute distress. Pulmonary.  Lungs clear bilaterally, normal respiratory effort. CV.  Regular rate and rhythm, no JVD, rub or murmur. Abdomen.  Soft, nontender, nondistended, BS positive. CNS.  Alert and oriented .  No focal neurologic deficit. Extremities.  Bilateral feet with clean bandage and Ace wrap Psychiatry.  Judgment and insight appears normal.    Data Reviewed: Prior data reviewed  Family Communication: Discussed with patient  Disposition: Status is: Inpatient Remains inpatient appropriate because: Severity of illness  Planned Discharge Destination: Home  DVT prophylaxis.  Lovenox  Time spent: 50 minutes  This record has been created using Conservation officer, historic buildings. Errors have been sought and corrected,but may not always be located. Such creation errors do not reflect on the standard of care.   Author: Amaryllis Dare, MD 06/30/2024 2:25 PM  For on call review [email or web address].

## 2024-06-30 NOTE — Progress Notes (Signed)
   [facility] has been requested to perform a transesophageal echocardiogram on [name] for bacteremia.     The patient does NOT have any absolute or relative contraindications to a Transesophageal Echocardiogram (TEE).  The patient has: No other conditions that may impact this procedure.  After careful review of history and examination, the risks and benefits of transesophageal echocardiogram have been explained including risks of esophageal damage, perforation (1:10,000 risk), bleeding, pharyngeal hematoma as well as other potential complications associated with conscious sedation including aspiration, arrhythmia, respiratory failure and death. Alternatives to treatment were discussed, questions were answered. Patient is willing to proceed.   Signed, Lesley LITTIE Maffucci, PA-C  06/30/2024 3:24 PM

## 2024-06-30 NOTE — Plan of Care (Signed)

## 2024-06-30 NOTE — Evaluation (Signed)
 Occupational Therapy Evaluation Patient Details Name: [name] MRN: [id number] DOB: 05-Mar-1966 Today's Date: 06/30/2024   History of Present Illness   Pt is a 58 y.o. presents with redness, pain swelling , discharge from the previous rt TMA site on 06/26/24 s/p revision TMA and debridement of chronic L foot ulcers with skin graft on 7/27. Pt is to be NWB for the most part at all times, but may apply a little bit of weight to his heels for transfers, but not to the balls of the feet. PMH of DM, DFI, , MRSA bacteremia and MRSA foot infection, Rt TMA, Left foot chronic wound.     Clinical Impressions Pt was seen for OT evaluation this date. PTA, pt resides at home alone where he was IND, however had not been following his weight bearing orders and had wound dehiscence.  Pt presents to acute OT demonstrating impaired ADL performance and functional mobility 2/2 pain and medical noncompliance for weight bearing orders. He is IND with bed mobility and supervision for lateral scoot transfers and weight bearing through heel only very briefly for stability. He is to wear bil post op shoes during transfers. Initially, pt attempting to stand to transfer to chair, however was quickly directed to sit and only perform scoot transfers to protect bil foot wounds. Edu on no standing or walking at this time and to perform weight shifts seated EOB to bathe peri-area. Pt with high likelihood of medical noncompliance with his weight bearing orders if he were to return home alone and have to manage his apartment by himself. He has a brother who works during the day, but could check in on him in the evenings only for a brief period. Pt would benefit from skilled OT services to address noted impairments and functional limitations  to maximize safety and independence while minimizing falls risk and caregiver burden. Do anticipate the need for follow up OT services upon acute hospital DC.      If plan is discharge home,  recommend the following:   A little help with walking and/or transfers;A little help with bathing/dressing/bathroom;Assistance with cooking/housework;Assist for transportation;Help with stairs or ramp for entrance     Functional Status Assessment   Patient has had a recent decline in their functional status and demonstrates the ability to make significant improvements in function in a reasonable and predictable amount of time.     Equipment Recommendations   BSC/3in1;Wheelchair (measurements OT)     Recommendations for Other Services         Precautions/Restrictions   Precautions Precautions: Fall Recall of Precautions/Restrictions: Intact Restrictions Weight Bearing Restrictions Per Provider Order: Yes RLE Weight Bearing Per Provider Order: Non weight bearing LLE Weight Bearing Per Provider Order: Non weight bearing Other Position/Activity Restrictions: per podiatrist is allowed heel wb'ing for transfers only in post op shoes for stability; no walking     Mobility Bed Mobility Overal bed mobility: Independent                  Transfers Overall transfer level: Modified independent                 General transfer comment: lateral scoot transfer bed<>recliner with cues for heel weight bearing only and edu on no walking or standing and need to be W/C level only.      Balance Overall balance assessment: Independent  ADL either performed or assessed with clinical judgement   ADL Overall ADL's : Needs assistance/impaired                         Toilet Transfer: Modified Independent Toilet Transfer Details (indicate cue type and reason): cueing to maintain WB through heel only for lateral scoot transfer recliner<>bed; pt initially attempted to stand-edu on no longer doing this and only lateral scooting                 Vision         Perception         Praxis          Pertinent Vitals/Pain Pain Assessment Pain Assessment: Faces Pain Score: 2  Faces Pain Scale: Hurts a little bit Pain Location: bil feet Pain Descriptors / Indicators: Sore Pain Intervention(s): Monitored during session, Repositioned, Limited activity within patient's tolerance     Extremity/Trunk Assessment Upper Extremity Assessment Upper Extremity Assessment: Overall WFL for tasks assessed   Lower Extremity Assessment Lower Extremity Assessment: Overall WFL for tasks assessed       Communication Communication Communication: No apparent difficulties   Cognition Arousal: Alert Behavior During Therapy: WFL for tasks assessed/performed                                 Following commands: Intact       Cueing  General Comments   Cueing Techniques: Verbal cues      Exercises Other Exercises Other Exercises: Edu on heel weight bearing only for transfers for stability and importance of using his core and UB strength for transfers and limited time on heels to promote healing. Edu on no standing or walking and need to be W/C level until cleared by MD.   Shoulder Instructions      Home Living Family/patient expects to be discharged to:: Private residence Living Arrangements: Alone Available Help at Discharge: Family;Available PRN/intermittently (brother who could check in after work each day) Type of Home: Apartment Home Access: Level entry     Home Layout: One level     Bathroom Shower/Tub: Tub/shower unit         Home Equipment: Shower seat;Grab bars - Building services engineer (2 wheels);Wheelchair - manual   Additional Comments: was non compliant with WBing at home, therefore wounds dihissed and didn't heal      Prior Functioning/Environment Prior Level of Function : Independent/Modified Independent             Mobility Comments: reports no falls. was using AD ADLs Comments: indep    OT Problem List: Pain   OT  Treatment/Interventions: Self-care/ADL training;Therapeutic exercise;Patient/family education;Balance training;DME and/or AE instruction      OT Goals(Current goals can be found in the care plan section)   Acute Rehab OT Goals Patient Stated Goal: heal wounds OT Goal Formulation: With patient Time For Goal Achievement: 07/14/24 Potential to Achieve Goals: Good ADL Goals Pt Will Perform Lower Body Bathing: with modified independence Pt Will Perform Lower Body Dressing: with modified independence;sitting/lateral leans Pt Will Transfer to Toilet: with modified independence (via lateral scoot following heel only weight bearing status)   OT Frequency:  Min 2X/week    Co-evaluation              AM-PAC OT 6 Clicks Daily Activity     Outcome Measure Help from another person eating meals?: None Help from  another person taking care of personal grooming?: None Help from another person toileting, which includes using toliet, bedpan, or urinal?: None Help from another person bathing (including washing, rinsing, drying)?: None Help from another person to put on and taking off regular upper body clothing?: None Help from another person to put on and taking off regular lower body clothing?: None 6 Click Score: 24   End of Session Nurse Communication: Mobility status  Activity Tolerance: Patient tolerated treatment well Patient left: in chair;with call bell/phone within reach  OT Visit Diagnosis: Other abnormalities of gait and mobility (R26.89);Pain Pain - Right/Left: Right Pain - part of body: Ankle and joints of foot                Time: 9241-9178 OT Time Calculation (min): 23 min Charges:  OT General Charges $OT Visit: 1 Visit OT Evaluation $OT Eval Low Complexity: 1 Low OT Treatments $Therapeutic Activity: 8-22 mins Seylah Wernert, OTR/L 06/30/24, 1:20 PM  Lash Matulich E Jshon Ibe 06/30/2024, 1:14 PM

## 2024-06-30 NOTE — Evaluation (Signed)
 Physical Therapy Re-Evaluation Patient Details Name: [name] MRN: [id number] DOB: 31-Aug-1966 Today's Date: 06/30/2024  History of Present Illness  Pt is a 58 y.o. who presented with redness, painn swelling, and discharge from the previous R TMA site on 06/26/24 and is now s/p revision TMA and debridement of chronic L foot ulcers with skin graft on 7/27. Per surgical note Pt is to be NWB for the most part at all times, but may apply a little bit of weight to his heels for transfers, but not to the balls of the feet. PMH includes: DM, DFI, MRSA bacteremia and MRSA foot infection, R TMA, left foot chronic wound.   Clinical Impression  Pt was extremely pleasant and motivated to participate during the session and put forth good effort throughout. Pt required no physical assistance during the session demonstrating good functional strength with bed mobility tasks and transfers.  Pt did require multi-modal cuing/education during the session, however, to ensure WB compliance during functional tasks.   For example pt found at onset of session sitting at EOB with stocking feet flat on the floor and attempted to stand upon this PT entering the room.  Pt educated immediately to return to sitting and to keep feet off of the floor with pt understanding and agreeing.  Pt demonstrated good ability to keep weight through heels only during lateral scoot transfers with min multi-modal cues for proper sequencing and with post-op shoes donned to bilateral feet.  Pt reported no adverse symptoms during the session other than his baseline foot pain that did not worsen with activity.  Pt is at very high risk for WB non-compliance and the medical risks associated with that if he were to discharge back home where he has no assistance during the days.  Pt will benefit from continued PT services in a skilled setting upon discharge to safely address deficits listed in patient problem list for decreased caregiver assistance and  eventual return to PLOF.           If plan is discharge home, recommend the following: Assistance with cooking/housework;Assist for transportation;A little help with walking and/or transfers;A little help with bathing/dressing/bathroom;Help with stairs or ramp for entrance   Can travel by private vehicle   No    Equipment Recommendations Other (comment) (TBD at next venue of care)  Recommendations for Other Services       Functional Status Assessment Patient has had a recent decline in their functional status and demonstrates the ability to make significant improvements in function in a reasonable and predictable amount of time.     Precautions / Restrictions Precautions Precautions: Fall Recall of Precautions/Restrictions: Intact Restrictions Weight Bearing Restrictions Per Provider Order: Yes RLE Weight Bearing Per Provider Order: Non weight bearing LLE Weight Bearing Per Provider Order: Non weight bearing Other Position/Activity Restrictions: per podiatrist pt is allowed heel WB for transfers only in post op shoes, no walking or WB through balls of the feet      Mobility  Bed Mobility Overal bed mobility: Independent             General bed mobility comments: Good speed and effort with bed mobility tasks without the need of the bed rail    Transfers Overall transfer level: Needs assistance Equipment used: None Transfers: Bed to chair/wheelchair/BSC            Lateral/Scoot Transfers: Supervision General transfer comment: Mod verbal and visual cues for sequencing with lateral scoot transfers bed to chair for heel WB  only with post op shoes donned    Ambulation/Gait               General Gait Details: not performed due to restrictions  Stairs            Wheelchair Mobility     Tilt Bed    Modified Rankin (Stroke Patients Only)       Balance Overall balance assessment: No apparent balance deficits (not formally assessed)                                            Pertinent Vitals/Pain Pain Assessment Pain Assessment: 0-10 Pain Score: 5  Pain Location: bilateral feet Pain Descriptors / Indicators: Sore Pain Intervention(s): Premedicated before session, Monitored during session, Repositioned    Home Living Family/patient expects to be discharged to:: Private residence Living Arrangements: Alone Available Help at Discharge: Family;Available PRN/intermittently (brother can check in after work only) Type of Home: Apartment Home Access: Level entry       Home Layout: One level Home Equipment: Shower seat;Grab bars - Building services engineer (2 wheels);Wheelchair - manual Additional Comments: was non compliant with WBing at home, therefore wounds dihissed and didn't heal    Prior Function Prior Level of Function : Independent/Modified Independent             Mobility Comments: Mod Ind amb with AD, no falls ADLs Comments: Ind with ADLs     Extremity/Trunk Assessment   Upper Extremity Assessment Upper Extremity Assessment: Overall WFL for tasks assessed    Lower Extremity Assessment Lower Extremity Assessment: Overall WFL for tasks assessed       Communication   Communication Communication: No apparent difficulties    Cognition Arousal: Alert Behavior During Therapy: WFL for tasks assessed/performed   PT - Cognitive impairments: No apparent impairments                         Following commands: Intact       Cueing Cueing Techniques: Verbal cues, Visual cues     General Comments      Exercises Total Joint Exercises Long Arc Quad: Strengthening, Both, 10 reps (with manual resistance) Knee Flexion: Strengthening, Both, 10 reps (with manual resistance) Marching in Standing: Strengthening, Both, 10 reps, Seated (elevated EOB with feet off of the floor and with manual resistance) Other Exercises Other Exercises: Education on importance of WB  compliance and proper sequencing with transfers for heel WB only   Assessment/Plan    PT Assessment Patient needs continued PT services  PT Problem List Decreased mobility;Decreased safety awareness;Decreased knowledge of precautions       PT Treatment Interventions Functional mobility training;Therapeutic activities;Therapeutic exercise;Patient/family education    PT Goals (Current goals can be found in the Care Plan section)  Acute Rehab PT Goals Patient Stated Goal: To go to rehab and recover PT Goal Formulation: With patient Time For Goal Achievement: 07/13/24 Potential to Achieve Goals: Good    Frequency Min 1X/week     Co-evaluation               AM-PAC PT 6 Clicks Mobility  Outcome Measure Help needed turning from your back to your side while in a flat bed without using bedrails?: None Help needed moving from lying on your back to sitting on the side of a flat bed without using bedrails?: None  Help needed moving to and from a bed to a chair (including a wheelchair)?: A Little Help needed standing up from a chair using your arms (e.g., wheelchair or bedside chair)?: A Little Help needed to walk in hospital room?: Total Help needed climbing 3-5 steps with a railing? : Total 6 Click Score: 16    End of Session   Activity Tolerance: Patient tolerated treatment well Patient left: in chair;with call bell/phone within reach Nurse Communication: Mobility status;Weight bearing status PT Visit Diagnosis: Pain;Other abnormalities of gait and mobility (R26.89) Pain - Right/Left:  (bilateral) Pain - part of body: Ankle and joints of foot    Time: 1353-1419 PT Time Calculation (min) (ACUTE ONLY): 26 min   Charges:   PT Evaluation $PT Re-evaluation: 1 Re-eval PT Treatments $Therapeutic Exercise: 8-22 mins PT General Charges $$ ACUTE PT VISIT: 1 Visit    D. Glendia Bertin PT, DPT 06/30/24, 2:48 PM

## 2024-06-30 NOTE — Progress Notes (Signed)
 Awaiting insulin  from pharmacy, Pyxis empty.

## 2024-07-01 ENCOUNTER — Other Ambulatory Visit: Payer: Self-pay | Admitting: Family Medicine

## 2024-07-01 ENCOUNTER — Inpatient Hospital Stay
Admit: 2024-07-01 | Discharge: 2024-07-01 | Disposition: A | Discharge status: Home / Self Care | Attending: Physician Assistant

## 2024-07-01 ENCOUNTER — Other Ambulatory Visit (HOSPITAL_COMMUNITY): Payer: Self-pay

## 2024-07-01 DIAGNOSIS — E1165 Type 2 diabetes mellitus with hyperglycemia: Secondary | ICD-10-CM | POA: Diagnosis not present

## 2024-07-01 DIAGNOSIS — M86171 Other acute osteomyelitis, right ankle and foot: Secondary | ICD-10-CM | POA: Diagnosis not present

## 2024-07-01 DIAGNOSIS — L089 Local infection of the skin and subcutaneous tissue, unspecified: Principal | ICD-10-CM

## 2024-07-01 DIAGNOSIS — B961 Klebsiella pneumoniae [K. pneumoniae] as the cause of diseases classified elsewhere: Secondary | ICD-10-CM | POA: Diagnosis not present

## 2024-07-01 DIAGNOSIS — R7881 Bacteremia: Secondary | ICD-10-CM | POA: Diagnosis not present

## 2024-07-01 DIAGNOSIS — I10 Essential (primary) hypertension: Secondary | ICD-10-CM | POA: Diagnosis not present

## 2024-07-01 DIAGNOSIS — E785 Hyperlipidemia, unspecified: Secondary | ICD-10-CM | POA: Diagnosis not present

## 2024-07-01 DIAGNOSIS — B9562 Methicillin resistant Staphylococcus aureus infection as the cause of diseases classified elsewhere: Secondary | ICD-10-CM | POA: Diagnosis not present

## 2024-07-01 DIAGNOSIS — Z1612 Extended spectrum beta lactamase (ESBL) resistance: Secondary | ICD-10-CM | POA: Diagnosis not present

## 2024-07-01 LAB — GLUCOSE, CAPILLARY
Glucose-Capillary: 280 mg/dL — ABNORMAL HIGH (ref 70–99)
Glucose-Capillary: 223 mg/dL — ABNORMAL HIGH (ref 70–99)
Glucose-Capillary: 206 mg/dL — ABNORMAL HIGH (ref 70–99)
Glucose-Capillary: 55 mg/dL — ABNORMAL LOW (ref 70–99)
Glucose-Capillary: 70 mg/dL (ref 70–99)
Glucose-Capillary: 243 mg/dL — ABNORMAL HIGH (ref 70–99)
Glucose-Capillary: 142 mg/dL — ABNORMAL HIGH (ref 70–99)

## 2024-07-01 LAB — CULTURE, BLOOD (ROUTINE X 2)
Culture: NO GROWTH
Special Requests: ADEQUATE

## 2024-07-01 LAB — SURGICAL PATHOLOGY

## 2024-07-01 MED ORDER — DEXTROSE 50 % IV SOLN
INTRAVENOUS | Status: AC
  Administered 2024-07-01: 25 mL
  Filled 2024-07-01: qty 50

## 2024-07-01 MED ORDER — SODIUM CHLORIDE 0.9 % IV SOLN: INTRAVENOUS | Status: AC

## 2024-07-01 NOTE — Progress Notes (Signed)
 Progress Note   Patient: [name] FMW:[id number] DOB: April 01, 1966 DOA: 06/26/2024     5 DOS: the patient was seen and examined on 07/01/2024   Brief hospital course: Taken from H&P.  [name] is a 58 y.o. African-American male with medical history significant for hypertension, dyslipidemia, tobacco abuse, stage IIIa chronic kidney disease, cocaine abuse, and type 2 diabetes mellitus, who presented to the emergency room with acute onset of right foot pain with drainage from a recent toe amputation wound of clear fluid.  No fever or chills.   On presentation stable vital, labs with hyperglycemia at 345, creatinine 1.42, albumin 2.8, leukocytosis at 12.3 and hemoglobin of 8.7.  Wound and blood cultures were obtained.  MRI of the foot with following results 1. Status post interval second through fifth ray amputation at the level of the mid metatarsal shafts. Associated soft tissue wound extending along the dorsal stump into the deep soft tissues overlying the second through fifth digit metatarsal transsection margins which demonstrate T2/STIR hyperintense marrow edema with corresponding T1 hypointensity. These findings are concerning for osteomyelitis of the remnant second through fifth metatarsals, although, recent postsurgical change can demonstrate a similar appearance. Cellulitis cannot be excluded. No drainable abscess. 2. Diffuse nonspecific increased T2 signal of the intrinsic musculature of the foot, myositis cannot be excluded. 3. Tenosynovitis of the posterior tibialis, flexor digitorum, and flexor hallucis longus tendons.  Podiatry was consulted and patient was started on broad-spectrum antibiotics with Zosyn  and vancomycin .  7/25: Vital stable, leukocytosis resolved, hemoglobin decreased to 7.7-all cell line decreased, creatinine with some improvement to 1.38, preliminary blood cultures negative in 12-hour, preliminary wound cultures with no organisms seen. Antibiotics switched  with meropenem  and linezolid  based on prior culture results and ID recommendations. Pending podiatry evaluation.  7/26: Vital stable 1 out of 4 blood culture bottles with MRSA, podiatry is planning TMA and debridement of left sided superficial ulcers on Sunday, 7/27.  Echocardiogram ordered. Improving renal function with stable hemoglobin at 7.7, recent anemia panel with some iron  deficiency and borderline B12 and folate, starting on supplement.  7/27: Vital stable, wound cultures growing Staph aureus, diphtheroid, rare Klebsiella pneumonia and rare procidentia. 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  Echocardiogram with no evidence of valvular disease on TTE, ID to decide whether need TEE or not at this time.  7/28: Hemodynamically stable, CBG elevated so increasing the dose of Semglee  and adding mealtime coverage.  Patient need TEE per ID-cardiology was consulted.  Patient to remain nonweightbearing on both extremities except heels. Do not remove left foot dressing due to skin graft per podiatry. They change right foot dressing today. Pending PT evaluation-likely will need SNF as he lives alone  7/29: Remained hemodynamically stable, PT is recommending SNF.  TEE got canceled due to some scheduling issues and will be done tomorrow morning   Assessment and Plan: * Acute osteomyelitis of right foot [facility]) Patient with history of recent multiple ray amputation with podiatry and now with wound dehiscence.  MRI concerning for osteo of the remaining bone versus postprocedural edema. Repeat wound cultures with knee 1/4 blood culture bottles with MRSA, Klebsiella pneumoniae and procidentia. S/p TMA and debridement of chronic left foot ulcers with skin graft on 7/27 - Continue with meropenem  -Continue with daptomycin  -Continue with supportive care -PT/OT evaluation  Uncontrolled type 2 diabetes mellitus with hyperglycemia, with long-term current use of insulin   (HCC) CBG mostly within goal now, with 1 episode of mild hypoglycemia  but patient was n.p.o. - Continue with SSI - Will continue basal coverage-increasing the dose of semglee  - Continue with mealtime coverage of 5 units - Will hold off metformin . - Will continue Neurontin  for diabetic neuropathy.  MRSA bacteremia 1 out of 4 blood culture bottles with MRSA bacteremia -Linezolid  switched with IV daptomycin  by ID - TTE negative for any acute concern of vegetation - Cardiology was consulted for TEE-likely be done tomorrow morning  Dyslipidemia - Will continue statin therapy.  Essential hypertension Blood pressure currently within goal -Continue home losartan    Subjective: Patient was seen and examined today.  No new concern.  Pain seems bearable with current regimen.  Physical Exam: Vitals:   06/30/24 2000 07/01/24 0250 07/01/24 0724 07/01/24 0731  BP: (!) 140/83 (!) 144/84 138/83 (!) 130/56  Pulse: 85 80 80 69  Resp: 20 18 18 16   Temp: 97.8 F (36.6 C) 98 F (36.7 C) 97.8 F (36.6 C) (!) 97.5 F (36.4 C)  TempSrc: Oral     SpO2: 98% 97% 98% 95%  Weight:      Height:       General.  Obese gentleman, in no acute distress. Pulmonary.  Lungs clear bilaterally, normal respiratory effort. CV.  Regular rate and rhythm, no JVD, rub or murmur. Abdomen.  Soft, nontender, nondistended, BS positive. CNS.  Alert and oriented .  No focal neurologic deficit. Extremities.  No edema,  pulses intact and symmetrical.  Bilateral feet with Ace wrap Psychiatry.  Judgment and insight appears normal.     Data Reviewed: Prior data reviewed  Family Communication: Discussed with patient  Disposition: Status is: Inpatient Remains inpatient appropriate because: Severity of illness  Planned Discharge Destination: Home  DVT prophylaxis.  Lovenox  Time spent: 50 minutes  This record has been created using Conservation officer, historic buildings. Errors have been sought and corrected,but may not  always be located. Such creation errors do not reflect on the standard of care.   Author: Amaryllis Dare, MD 07/01/2024 3:24 PM  For on call review [email or web address].

## 2024-07-01 NOTE — Progress Notes (Signed)
 Patient CBG was 55 mg/dL @1146 . C/o of dizziness. Given fast acting regular soda half a small cup. Rechecked @1206  CBG was 70 mg/dL. Notified MD

## 2024-07-01 NOTE — Inpatient Diabetes Management (Signed)
 Inpatient Diabetes Program Recommendations  AACE/ADA: New Consensus Statement on Inpatient Glycemic Control (2015)  Target Ranges:  Prepandial:   less than 140 mg/dL      Peak postprandial:   less than 180 mg/dL (1-2 hours)      Critically ill patients:  140 - 180 mg/dL   Lab Results  Component Value Date   GLUCAP 70 07/01/2024   HGBA1C 13.1 (H) 03/26/2024    Latest Reference Range & Units 07/01/24 02:49 07/01/24 06:11 07/01/24 07:22 07/01/24 11:46 07/01/24 12:06  Glucose-Capillary 70 - 99 mg/dL 719 (H) 776 (H) 793 (H) 55 (L) 70  (H): Data is abnormally high (L): Data is abnormally low   Diabetes history: DM 2 Outpatient Diabetes medications:  Novolog  0-15 units tid with meals, NPH 22 units bid Current orders for Inpatient glycemic control:  Novolog  0-15 units q 4 hours NPH 22 units bid  Inpatient Diabetes Program Recommendations:   Noted hypoglycemia post Novolog  correction and meal coverage. Please consider: -Decrease Novolog  correction to 0-9 units tid, 0-5 units hs  Thank you, Audi Conover E. Mikaelah Trostle, RN, MSN, CDCES  Diabetes Coordinator Inpatient Glycemic Control Team Team Pager [phone] (8am-5pm) 07/01/2024 1:14 PM

## 2024-07-01 NOTE — Progress Notes (Signed)
 Date of Admission:  06/26/2024     ID: [name] is a 58 y.o. male  Principal Problem:   Acute osteomyelitis of right foot (HCC) Active Problems:   MRSA bacteremia   Essential hypertension   Dyslipidemia   Uncontrolled type 2 diabetes mellitus with hyperglycemia, with long-term current use of insulin  (HCC)    Subjective: Did not get TEE today as busy schedule  Medications:   ascorbic acid   500 mg Oral Daily   cyanocobalamin   1,000 mcg Oral Daily   enoxaparin  (LOVENOX ) injection  0.5 mg/kg Subcutaneous Q24H   folic acid   1 mg Oral Daily   gabapentin   300 mg Oral BID   insulin  aspart  0-15 Units Subcutaneous TID WC   insulin  aspart  0-5 Units Subcutaneous QHS   insulin  aspart  5 Units Subcutaneous TID WC   insulin  glargine-yfgn  25 Units Subcutaneous BID   iron  polysaccharides  150 mg Oral Daily   losartan   25 mg Oral Daily   mupirocin  ointment  1 Application Nasal BID   nitroGLYCERIN   0.2 mg Transdermal Daily   Vitamin D  (Ergocalciferol )  50,000 Units Oral Q Tue    Objective: Vital signs in last 24 hours: Patient Vitals for the past 24 hrs:  BP Temp Pulse Resp SpO2  07/01/24 0731 (!) 130/56 (!) 97.5 F (36.4 C) 69 16 95 %  07/01/24 0724 138/83 97.8 F (36.6 C) 80 18 98 %  07/01/24 0250 (!) 144/84 98 F (36.7 C) 80 18 97 %     Lines and Device Date on insertion # of days DC  Engineer, technical sales     ETT       PHYSICAL EXAM:  General: Alert, cooperative, no distress, appears stated age.  Lungs: Clear to auscultation bilaterally. No Wheezing or Rhonchi. No rales. Heart: Regular rate and rhythm, no murmur, rub or gallop. Abdomen: Soft, non-tender,not distended. Bowel sounds normal. No masses Extremities:rt foot   Left foot has a graft- dressing note removed Skin: No rashes or lesions. Or bruising Lymph: Cervical, supraclavicular normal. Neurologic: Grossly non-focal  Lab Results    Latest Ref Rng & Units 06/30/2024    4:22 AM  06/28/2024    5:36 AM 06/27/2024    5:02 AM  CBC  WBC 4.0 - 10.5 K/uL 13.4  8.4  10.0   Hemoglobin 13.0 - 17.0 g/dL 8.1  7.7  7.7   Hematocrit 39.0 - 52.0 % 25.7  25.3  24.8   Platelets 150 - 400 K/uL 472  437  404        Latest Ref Rng & Units 06/30/2024    4:22 AM 06/28/2024    5:36 AM 06/27/2024    5:02 AM  CMP  Glucose 70 - 99 mg/dL 798  840  859   BUN 6 - 20 mg/dL 23  16  17    Creatinine 0.61 - 1.24 mg/dL 8.70  8.72  8.61   Sodium 135 - 145 mmol/L 139  134  137   Potassium 3.5 - 5.1 mmol/L 4.7  4.3  4.1   Chloride 98 - 111 mmol/L 103  102  101   CO2 22 - 32 mmol/L 26  27  28    Calcium  8.9 - 10.3 mg/dL 8.7  8.2  8.3       Microbiology: [facility]- 06/26/24 MRSA 7/24 Superficial wound culture MRSA, ESBl kleb 06/29/24 Tissue culture sent during surgery pending  Pathology reactive cortical and trabecular bone  with no acute inflammation    Assessment/Plan:  MRSA bacteremia- secondary to rt foot infection recurrent 2 d echo N Awaiting TEE Repeat blood culture sent- if it remains neg tomorrow and after TEE he can get PICC as he will need 4-6 weeks of Iv antibiotic On daptomycin  ( MIC sent)      Infection rt TMA-  polymicrobial ESBL klebsiella, MRSA and dipheroids in superficial culture -underlying  osteomyelitis On meropenem  s/p revision TMA t Surgical bone culture pending  bone pathology no acute osteomyelitis   DM   Neuropathy     Lft foot ulcer chronic- had graft placement on 7/27     Pt needs to go to SNF to get IV and stay off his feet so that both his feet wounds can heal.  Discussed the management with tpatient

## 2024-07-01 NOTE — Plan of Care (Signed)
  Problem: Education: Goal: Knowledge of General Education information will improve Description: Including pain rating scale, medication(s)/side effects and non-pharmacologic comfort measures Outcome: Progressing   Problem: Health Behavior/Discharge Planning: Goal: Ability to manage health-related needs will improve Outcome: Progressing   Problem: Clinical Measurements: Goal: Ability to maintain clinical measurements within normal limits will improve Outcome: Progressing Goal: Will remain free from infection Outcome: Progressing Goal: Diagnostic test results will improve Outcome: Progressing Goal: Respiratory complications will improve Outcome: Progressing Goal: Cardiovascular complication will be avoided Outcome: Progressing   Problem: Activity: Goal: Risk for activity intolerance will decrease Outcome: Progressing   Problem: Nutrition: Goal: Adequate nutrition will be maintained Outcome: Progressing   Problem: Coping: Goal: Level of anxiety will decrease Outcome: Progressing   Problem: Elimination: Goal: Will not experience complications related to bowel motility Outcome: Progressing Goal: Will not experience complications related to urinary retention Outcome: Progressing   Problem: Pain Managment: Goal: General experience of comfort will improve and/or be controlled Outcome: Progressing   Problem: Safety: Goal: Ability to remain free from injury will improve Outcome: Progressing   Problem: Skin Integrity: Goal: Risk for impaired skin integrity will decrease Outcome: Progressing   Problem: Clinical Measurements: Goal: Ability to avoid or minimize complications of infection will improve Outcome: Progressing   Problem: Skin Integrity: Goal: Skin integrity will improve Outcome: Progressing   Problem: Education: Goal: Ability to describe self-care measures that may prevent or decrease complications (Diabetes Survival Skills Education) will improve Outcome:  Progressing   Problem: Coping: Goal: Ability to adjust to condition or change in health will improve Outcome: Progressing   Problem: Fluid Volume: Goal: Ability to maintain a balanced intake and output will improve Outcome: Progressing   Problem: Health Behavior/Discharge Planning: Goal: Ability to identify and utilize available resources and services will improve Outcome: Progressing Goal: Ability to manage health-related needs will improve Outcome: Progressing   Problem: Metabolic: Goal: Ability to maintain appropriate glucose levels will improve Outcome: Progressing   Problem: Nutritional: Goal: Maintenance of adequate nutrition will improve Outcome: Progressing Goal: Progress toward achieving an optimal weight will improve Outcome: Progressing   Problem: Skin Integrity: Goal: Risk for impaired skin integrity will decrease Outcome: Progressing   Problem: Tissue Perfusion: Goal: Adequacy of tissue perfusion will improve Outcome: Progressing

## 2024-07-01 NOTE — TOC CM/SW Note (Signed)
..  Transition of Care [facility]) - Inpatient Brief Assessment   Patient Details  Name: [name] MRN: [id number] Date of Birth: [birth year]  Transition of Care [facility]) CM/SW Contact:    Edsel DELENA Fischer, LCSW Phone Number: 07/01/2024, 2:39 PM   Clinical Narrative:  SW spoke with pt regarding placement. Pt stated that he has family in [place] and would like for sw to look into SNF in [place] first and then [place]. Pt does not have PASRR.  SW working on Corporate investment banker for pt and completing FL2   Transition of Care Asessment:

## 2024-07-02 ENCOUNTER — Inpatient Hospital Stay: Payer: Self-pay | Admitting: Anesthesiology

## 2024-07-02 ENCOUNTER — Other Ambulatory Visit (HOSPITAL_COMMUNITY): Payer: Self-pay

## 2024-07-02 ENCOUNTER — Encounter (HOSPITAL_COMMUNITY): Payer: Self-pay

## 2024-07-02 ENCOUNTER — Encounter: Payer: Self-pay | Admitting: Anesthesiology

## 2024-07-02 ENCOUNTER — Encounter
Admission: EM | Disposition: A | Discharge status: Skilled Nursing Facility | Payer: Self-pay | Source: Home / Self Care | Attending: Internal Medicine

## 2024-07-02 ENCOUNTER — Inpatient Hospital Stay
Admit: 2024-07-02 | Discharge: 2024-07-02 | Disposition: A | Discharge status: Home / Self Care | Attending: Physician Assistant

## 2024-07-02 DIAGNOSIS — B961 Klebsiella pneumoniae [K. pneumoniae] as the cause of diseases classified elsewhere: Secondary | ICD-10-CM | POA: Diagnosis not present

## 2024-07-02 DIAGNOSIS — I38 Endocarditis, valve unspecified: Secondary | ICD-10-CM

## 2024-07-02 DIAGNOSIS — R7881 Bacteremia: Secondary | ICD-10-CM | POA: Diagnosis not present

## 2024-07-02 DIAGNOSIS — Z1612 Extended spectrum beta lactamase (ESBL) resistance: Secondary | ICD-10-CM | POA: Diagnosis not present

## 2024-07-02 DIAGNOSIS — M86171 Other acute osteomyelitis, right ankle and foot: Secondary | ICD-10-CM | POA: Diagnosis not present

## 2024-07-02 DIAGNOSIS — B9562 Methicillin resistant Staphylococcus aureus infection as the cause of diseases classified elsewhere: Secondary | ICD-10-CM | POA: Diagnosis not present

## 2024-07-02 HISTORY — PX: TEE WITHOUT CARDIOVERSION: SHX5443

## 2024-07-02 LAB — BASIC METABOLIC PANEL WITH GFR
Anion gap: 6 (ref 5–15)
BUN: 19 mg/dL (ref 6–20)
CO2: 28 mmol/L (ref 22–32)
Calcium: 8.8 mg/dL — ABNORMAL LOW (ref 8.9–10.3)
Chloride: 104 mmol/L (ref 98–111)
Creatinine, Ser: 1.14 mg/dL (ref 0.61–1.24)
GFR, Estimated: >60 mL/min (ref >60)
Glucose, Bld: 74 mg/dL (ref 70–99)
Potassium: 4.2 mmol/L (ref 3.5–5.1)
Sodium: 138 mmol/L (ref 135–145)

## 2024-07-02 LAB — GLUCOSE, CAPILLARY
Glucose-Capillary: 202 mg/dL — ABNORMAL HIGH (ref 70–99)
Glucose-Capillary: 129 mg/dL — ABNORMAL HIGH (ref 70–99)
Glucose-Capillary: 140 mg/dL — ABNORMAL HIGH (ref 70–99)
Glucose-Capillary: 145 mg/dL — ABNORMAL HIGH (ref 70–99)
Glucose-Capillary: 169 mg/dL — ABNORMAL HIGH (ref 70–99)

## 2024-07-02 LAB — ECHO TEE

## 2024-07-02 SURGERY — ECHOCARDIOGRAM, TRANSESOPHAGEAL
Anesthesia: General

## 2024-07-02 MED ORDER — LIDOCAINE VISCOUS HCL 2 % MT SOLN
OROMUCOSAL | Status: AC
  Filled 2024-07-02: qty 15

## 2024-07-02 MED ORDER — PROPOFOL 10 MG/ML IV BOLUS
INTRAVENOUS | Status: DC | PRN
  Administered 2024-07-02: 80 mg via INTRAVENOUS
  Administered 2024-07-02 (×3): 20 mg via INTRAVENOUS

## 2024-07-02 MED ORDER — SODIUM CHLORIDE 0.9 % IV SOLN: INTRAVENOUS | Status: AC

## 2024-07-02 MED ORDER — BUTAMBEN-TETRACAINE-BENZOCAINE 2-2-14 % EX AERO
INHALATION_SPRAY | CUTANEOUS | Status: AC
Start: 2024-07-02 — End: 2024-07-02
  Filled 2024-07-02: qty 5

## 2024-07-02 NOTE — Transfer of Care (Signed)
 Immediate Anesthesia Transfer of Care Note  Patient: [name]  Procedure(s) Performed: ECHOCARDIOGRAM, TRANSESOPHAGEAL  Patient Location: PACU and Nursing Unit  Anesthesia Type:General  Level of Consciousness: drowsy and patient cooperative  Airway & Oxygen  Therapy: Patient Spontanous Breathing and Patient connected to nasal cannula oxygen   Post-op Assessment: Report given to RN and Post -op Vital signs reviewed and stable  Post vital signs: Reviewed and stable  Last Vitals:  Vitals Value Taken Time  BP 113/86 07/02/24 08:33  Temp    Pulse 74 07/02/24 08:33  Resp 17 07/02/24 08:33  SpO2 98 % 07/02/24 08:33  Vitals shown include unfiled device data.  Last Pain:  Vitals:   07/02/24 0732  TempSrc: Oral  PainSc: 0-No pain      Patients Stated Pain Goal: 1 (06/26/24 2113)  Complications: No notable events documented.

## 2024-07-02 NOTE — Progress Notes (Signed)
 Physical Therapy Treatment Patient Details Name: [name] MRN: [id number] DOB: [birth year] Today's Date: 07/02/2024   History of Present Illness Pt is a 58 y.o. who presented with redness, painn swelling, and discharge from the previous R TMA site on 06/26/24 and is now s/p revision TMA and debridement of chronic L foot ulcers with skin graft on 7/27. Per surgical note Pt is to be NWB for the most part at all times, but may apply a little bit of weight to his heels for transfers, but not to the balls of the feet. PMH includes: DM, DFI, MRSA bacteremia and MRSA foot infection, R TMA, left foot chronic wound.    PT Comments  Pt was pleasant and motivated to participate during the session and put forth good effort throughout. Pt demonstrated good speed and effort with bed mobility tasks without the need of the bed rail.  Pt declined to transfer to chair this session secondary to just having returned to bed from the chair.  Pt put forth very good effort with below therex with manual resistance added to pt tolerance when appropriate.  Pt reported no adverse symptoms during the session with SpO2 and HR WNL throughout on room air.  Pt will benefit from continued PT services upon discharge to safely address deficits listed in patient problem list for decreased caregiver assistance and eventual return to PLOF.       If plan is discharge home, recommend the following: Assistance with cooking/housework;Assist for transportation;A little help with walking and/or transfers;A little help with bathing/dressing/bathroom;Help with stairs or ramp for entrance   Can travel by private vehicle     No  Equipment Recommendations  Other (comment) (TBD at next venue of care)    Recommendations for Other Services       Precautions / Restrictions Precautions Precautions: Fall Recall of Precautions/Restrictions: Intact Restrictions Weight Bearing Restrictions Per Provider Order: Yes RLE Weight Bearing Per  Provider Order: Non weight bearing LLE Weight Bearing Per Provider Order: Non weight bearing Other Position/Activity Restrictions: per podiatrist pt is allowed heel WB for transfers only in post op shoes, no walking or WB through balls of the feet     Mobility  Bed Mobility Overal bed mobility: Independent             General bed mobility comments: Good speed and effort with bed mobility tasks without the need of the bed rail    Transfers                   General transfer comment: Pt declined transfer to chair this session having recently left the chair to get back to bed    Ambulation/Gait                   Stairs             Wheelchair Mobility     Tilt Bed    Modified Rankin (Stroke Patients Only)       Balance                                            Communication Communication Communication: No apparent difficulties  Cognition Arousal: Alert Behavior During Therapy: WFL for tasks assessed/performed   PT - Cognitive impairments: No apparent impairments  Following commands: Intact      Cueing Cueing Techniques: Verbal cues  Exercises Total Joint Exercises Quad Sets: Strengthening, Both, 10 reps Towel Squeeze: Strengthening, Both, 10 reps Short Arc Quad: Strengthening, Both, 10 reps, 15 reps (with manual resistance) Hip ABduction/ADduction: Strengthening, Both, 10 reps (with manual resistance) Straight Leg Raises: Strengthening, Both, 10 reps (with manual resistance) Long Arc Quad: Strengthening, Both, 10 reps (with manual resistance) Knee Flexion: Strengthening, Both, 10 reps (with manual resistance) Bridges: Strengthening, Both, 10 reps, 15 reps (pushing through bolsters under knees, no WB through feet) Other Exercises Other Exercises: Pt education/reinforcement on the importance of BLE WB status compliance Other Exercises: Pt education on breathing timing during therex  with manual resistance    General Comments        Pertinent Vitals/Pain Pain Assessment Pain Assessment: No/denies pain    Home Living                          Prior Function            PT Goals (current goals can now be found in the care plan section) Progress towards PT goals: Progressing toward goals    Frequency    Min 1X/week      PT Plan      Co-evaluation              AM-PAC PT 6 Clicks Mobility   Outcome Measure  Help needed turning from your back to your side while in a flat bed without using bedrails?: None Help needed moving from lying on your back to sitting on the side of a flat bed without using bedrails?: None Help needed moving to and from a bed to a chair (including a wheelchair)?: A Little Help needed standing up from a chair using your arms (e.g., wheelchair or bedside chair)?: A Little Help needed to walk in hospital room?: Total Help needed climbing 3-5 steps with a railing? : Total 6 Click Score: 16    End of Session   Activity Tolerance: Patient tolerated treatment well Patient left: in bed;with call bell/phone within reach Nurse Communication: Mobility status;Weight bearing status PT Visit Diagnosis: Pain;Other abnormalities of gait and mobility (R26.89) Pain - Right/Left:  (bilateral) Pain - part of body: Ankle and joints of foot     Time: 8555-8487 PT Time Calculation (min) (ACUTE ONLY): 28 min  Charges:    $Therapeutic Exercise: 23-37 mins PT General Charges $$ ACUTE PT VISIT: 1 Visit                     D. Scott Mariame Rybolt PT, DPT 07/02/24, 3:23 PM

## 2024-07-02 NOTE — Anesthesia Preprocedure Evaluation (Signed)
 Anesthesia Evaluation  Patient identified by MRN, date of birth, ID band Patient awake    Reviewed: Allergy & Precautions, NPO status , Patient's Chart, lab work & pertinent test results  History of Anesthesia Complications Negative for: history of anesthetic complications  Airway Mallampati: III  TM Distance: <3 FB Neck ROM: full    Dental  (+) Chipped, Poor Dentition   Pulmonary neg shortness of breath, former smoker   Pulmonary exam normal        Cardiovascular hypertension, (-) angina Normal cardiovascular exam     Neuro/Psych negative neurological ROS  negative psych ROS   GI/Hepatic negative GI ROS, Neg liver ROS,neg GERD  ,,  Endo/Other  diabetes, Type 2    Renal/GU Renal disease  negative genitourinary   Musculoskeletal   Abdominal   Peds  Hematology negative hematology ROS (+)   Anesthesia Other Findings Past Medical History: No date: Amputation of right great toe (HCC) No date: Cellulitis and abscess of foot No date: CKD stage 3a, GFR 45-59 ml/min (HCC) No date: Cocaine abuse (HCC)     Comment:  + UDS on 05-12-24 No date: DM (diabetes mellitus), type 2 (HCC) 04/18/2024: ESBL (extended spectrum beta-lactamase) producing  bacteria infection No date: Hyperlipidemia No date: Hypertension No date: MRSA bacteremia No date: Normocytic anemia No date: Obesity No date: Tobacco abuse No date: Toe osteomyelitis, left (HCC) No date: Tuberculosis     Comment:  tested positive,treated with medications.  Past Surgical History: 03/26/2024: AMPUTATION TOE; Right     Comment:  Procedure: AMPUTATION, TOE;  Surgeon: Harden Jerona GAILS,               MD;  Location: [facility] OR;  Service: Orthopedics;  Laterality:              Right;  RIGHT GREAT TOE AMPUTATION 04/18/2024: BONE BIOPSY     Comment:  Procedure: BIOPSY, BONE (2ND METATARSAL);  Surgeon:               Ashley Soulier, DPM;  Location: [facility];  Service:                Orthopedics/Podiatry;; No date: FOOT SURGERY; Left 05/05/2022: I & D EXTREMITY; Left     Comment:  Procedure: LEFT FOOT DEBRIDEMENT;  Surgeon: Harden Jerona GAILS, MD;  Location: [facility] OR;  Service: Orthopedics;                Laterality: Left; 06/05/2024: INCISION AND DRAINAGE OF WOUND; Right     Comment:  Procedure: DELAYED PRIMARY CLOSURE OF RIGHT FOOT WOUND,               EXCISION OF DISTAL METATARSAL 2, 3, 4, and 5;  Surgeon:               Ashley Soulier, DPM;  Location: [facility];  Service:               Orthopedics/Podiatry;  Laterality: Right; 04/18/2024: IRRIGATION AND DEBRIDEMENT FOOT; Right     Comment:  Procedure: IRRIGATION AND DEBRIDEMENT FOOT, PLACEMENT OF              ANTIBIOTIC BEADS;  Surgeon: Ashley Soulier, DPM;                Location: [facility];  Service: Orthopedics/Podiatry;                Laterality: Right; 06/29/2024: IRRIGATION  AND DEBRIDEMENT FOOT; Left     Comment:  Procedure: IRRIGATION AND DEBRIDEMENT FOOT;  Surgeon:               Lennie Barter, DPM;  Location: [facility];  Service:               Orthopedics/Podiatry;  Laterality: Left; 07/20/2022: MYRINGOTOMY WITH TUBE PLACEMENT; Bilateral     Comment:  Procedure: MYRINGOTOMY WITH TUBE PLACEMENT;  Surgeon:               Juengel, Paul, MD;  Location: [facility];                Service: ENT;  Laterality: Bilateral;  Diabetic No date: TONSILLECTOMY 04/23/2024: TRANSMETATARSAL AMPUTATION; Right     Comment:  Procedure: AMPUTATION, FOOT, TRANSMETATARSAL;  Surgeon:               Ashley Soulier, DPM;  Location: [facility];  Service:               Orthopedics/Podiatry;  Laterality: Right; 06/29/2024: TRANSMETATARSAL AMPUTATION; Right     Comment:  Procedure: AMPUTATION, FOOT, TRANSMETATARSAL;  Surgeon:               Lennie Barter, DPM;  Location: [facility];  Service:               Orthopedics/Podiatry;  Laterality: Right;  RIGHT               TRANSMETATARSAL AMPUTATION REVISION  BMI    Body Mass Index:  32.28 kg/m      Reproductive/Obstetrics negative OB ROS                              Anesthesia Physical Anesthesia Plan  ASA: 3  Anesthesia Plan: General   Post-op Pain Management:    Induction: Intravenous  PONV Risk Score and Plan: Propofol  infusion and TIVA  Airway Management Planned: Natural Airway and Nasal Cannula  Additional Equipment:   Intra-op Plan:   Post-operative Plan:   Informed Consent: I have reviewed the patients History and Physical, chart, labs and discussed the procedure including the risks, benefits and alternatives for the proposed anesthesia with the patient or authorized representative who has indicated his/her understanding and acceptance.     Dental Advisory Given  Plan Discussed with: Anesthesiologist, CRNA and Surgeon  Anesthesia Plan Comments: (Patient consented for risks of anesthesia including but not limited to:  - adverse reactions to medications - risk of airway placement if required - damage to eyes, teeth, lips or other oral mucosa - nerve damage due to positioning  - sore throat or hoarseness - Damage to heart, brain, nerves, lungs, other parts of body or loss of life  Patient voiced understanding and assent.)        Anesthesia Quick Evaluation

## 2024-07-02 NOTE — Progress Notes (Addendum)
 Progress Note    [name]  FMW:[id number] DOB: 24-Mar-1966  DOA: 06/26/2024 PCP: [name], MD      Brief Narrative:    Medical records reviewed and are as summarized below:  [name] is a 58 y.o. male with medical history significant for hypertension, dyslipidemia, tobacco abuse, stage IIIa chronic kidney disease, cocaine abuse, and type 2 diabetes mellitus, who presented to the emergency room with acute onset of right foot pain with drainage from a recent toe amputation wound of clear fluid.  No fever or chills.    On presentation stable vital, labs with hyperglycemia at 345, creatinine 1.42, albumin 2.8, leukocytosis at 12.3 and hemoglobin of 8.7.  Wound and blood cultures were obtained.   MRI of the foot : 1. Status post interval second through fifth ray amputation at the level of the mid metatarsal shafts. Associated soft tissue wound extending along the dorsal stump into the deep soft tissues overlying the second through fifth digit metatarsal transsection margins which demonstrate T2/STIR hyperintense marrow edema with corresponding T1 hypointensity. These findings are concerning for osteomyelitis of the remnant second through fifth metatarsals, although, recent postsurgical change can demonstrate a similar appearance. Cellulitis cannot be excluded. No drainable abscess. 2. Diffuse nonspecific increased T2 signal of the intrinsic musculature of the foot, myositis cannot be excluded. 3. Tenosynovitis of the posterior tibialis, flexor digitorum, and flexor hallucis longus tendons.   He was admitted to the hospital for right foot osteomyelitis.  Blood culture showed MRSA consistent with MRSA bacteremia.   Assessment/Plan:   Principal Problem:   Acute osteomyelitis of right foot (HCC) Active Problems:   MRSA bacteremia   Uncontrolled type 2 diabetes mellitus with hyperglycemia, with long-term current use of insulin  (HCC)   Essential hypertension    Dyslipidemia   Wound infection   Endocarditis    Body mass index is 32.28 kg/m.  (Class I obesity)   Acute right foot osteomyelitis, MRSA bacteremia: S/p right revision transmetatarsal amputation, left foot subcutaneous wound debridement with biopsy, left foot wound graft placement on 06/29/2024.  Surgical wound culture showed E. coli that is pansensitive. S/p TEE on 07/02/2024 which was negative for vegetations.  Preserved EF. Repeat blood culture from 06/30/2024- no growth thus far. Continue IV daptomycin  and meropenem . Patient will need PICC line for long-term antibiotics.  Will hold off on PICC line until disposition plan/plan patient is close to discharge. Patient will need SNF at discharge.  Consulted Child psychotherapist to assist with placement. Case discussed with Dr. Searcy, ID specialist   Type II DM with hyperglycemia: Continue Semglee  and NovoLog .  Use NovoLog  as needed for hyperglycemia.   Comorbidities include hypertension, dyslipidemia   Diet Order             Diet Carb Modified Fluid consistency: Thin; Room service appropriate? Yes  Diet effective now                            Consultants: ID specialist Cardiologist  Procedures: TEE on 07/02/2024    Medications:    ascorbic acid   500 mg Oral Daily   cyanocobalamin   1,000 mcg Oral Daily   enoxaparin  (LOVENOX ) injection  0.5 mg/kg Subcutaneous Q24H   folic acid   1 mg Oral Daily   gabapentin   300 mg Oral BID   insulin  aspart  0-15 Units Subcutaneous TID WC   insulin  aspart  0-5 Units Subcutaneous QHS   insulin  aspart  5  Units Subcutaneous TID WC   insulin  glargine-yfgn  25 Units Subcutaneous BID   iron  polysaccharides  150 mg Oral Daily   losartan   25 mg Oral Daily   mupirocin  ointment  1 Application Nasal BID   nitroGLYCERIN   0.2 mg Transdermal Daily   Vitamin D  (Ergocalciferol )  50,000 Units Oral Q Tue   Continuous Infusions:  sodium chloride  40 mL/hr at 07/02/24 0735   DAPTOmycin   800 mg (07/01/24 1524)   meropenem  (MERREM ) IV 1 g (07/02/24 0533)     Anti-infectives (From admission, onward)    Start     Dose/Rate Route Frequency Ordered Stop   06/28/24 1145  DAPTOmycin  (CUBICIN ) 800 mg in sodium chloride  0.9 % IVPB        8 mg/kg  102.1 kg 132 mL/hr over 30 Minutes Intravenous Daily 06/28/24 1051     06/27/24 2200  linezolid  (ZYVOX ) tablet 600 mg  Status:  Discontinued        600 mg Oral Every 12 hours 06/27/24 1003 06/28/24 1048   06/27/24 1300  meropenem  (MERREM ) 1 g in sodium chloride  0.9 % 100 mL IVPB        1 g 200 mL/hr over 30 Minutes Intravenous Every 8 hours 06/27/24 0958     06/27/24 0000  vancomycin  (VANCOCIN ) IVPB 1000 mg/200 mL premix       Placed in Followed by Linked Group   1,000 mg 200 mL/hr over 60 Minutes Intravenous  Once 06/26/24 2127 06/27/24 0111   06/26/24 2215  piperacillin -tazobactam (ZOSYN ) IVPB 3.375 g  Status:  Discontinued       Placed in And Linked Group   3.375 g 100 mL/hr over 30 Minutes Intravenous  Once 06/26/24 2118 06/26/24 2127   06/26/24 2215  vancomycin  (VANCOREADY) IVPB 1250 mg/250 mL       Placed in Followed by Linked Group   1,250 mg 166.7 mL/hr over 90 Minutes Intravenous  Once 06/26/24 2127 06/27/24 0000   06/26/24 2215  piperacillin -tazobactam (ZOSYN ) IVPB 3.375 g  Status:  Discontinued        3.375 g 12.5 mL/hr over 240 Minutes Intravenous Every 8 hours 06/26/24 2127 06/27/24 0958   06/26/24 2141  vancomycin  variable dose per unstable renal function (pharmacist dosing)  Status:  Discontinued         Does not apply See admin instructions 06/26/24 2141 06/27/24 1004   06/26/24 1930  ciprofloxacin  (CIPRO ) IVPB 400 mg        400 mg 200 mL/hr over 60 Minutes Intravenous  Once 06/26/24 1923 06/26/24 2047              Family Communication/Anticipated D/C date and plan/Code Status   DVT prophylaxis:      Code Status: Full Code  Family Communication: None Disposition Plan: Plans to go to  SNF   Status is: Inpatient Remains inpatient appropriate because: MRSA bacteremia, right foot osteomyelitis       Subjective:   Interval events noted.  He has no complaints.  Kelli, LPN, was at the bedside  Objective:    Vitals:   07/02/24 0835 07/02/24 0845 07/02/24 0900 07/02/24 0937  BP: 120/79 103/74 120/84 (!) 142/87  Pulse: 74 79 80 81  Resp: 18 15 20 18   Temp: 98.5 F (36.9 C)  98.1 F (36.7 C) 97.8 F (36.6 C)  TempSrc: Temporal  Temporal Oral  SpO2: 98% 97% 98% 100%  Weight:      Height:       No data  found.   Intake/Output Summary (Last 24 hours) at 07/02/2024 1227 Last data filed at 07/02/2024 0900 Gross per 24 hour  Intake 720 ml  Output --  Net 720 ml   Filed Weights   06/26/24 1335  Weight: 102.1 kg    Exam:  GEN: NAD SKIN: Warm and dry EYES: No pallor or icterus ENT: MMM CV: RRR PULM: CTA B ABD: soft, ND, NT, +BS CNS: AAO x 3, non focal EXT: Dressings on bilateral feet are clean, dry and intact        Data Reviewed:   I have personally reviewed following labs and imaging studies:  Labs: Labs show the following:   Basic Metabolic Panel: Recent Labs  Lab 06/26/24 1337 06/27/24 0502 06/28/24 0536 06/30/24 0422  NA 135 137 134* 139  K 5.0 4.1 4.3 4.7  CL 100 101 102 103  CO2 24 28 27 26   GLUCOSE 345* 140* 159* 201*  BUN 17 17 16  23*  CREATININE 1.42* 1.38* 1.27* 1.29*  CALCIUM  8.7* 8.3* 8.2* 8.7*   GFR Estimated Creatinine Clearance: 75.6 mL/min (A) (by C-G formula based on SCr of 1.29 mg/dL (H)). Liver Function Tests: Recent Labs  Lab 06/26/24 1337  AST 11*  ALT 9  ALKPHOS 88  BILITOT 0.4  PROT 7.5  ALBUMIN 2.8*   No results for input(s): LIPASE, AMYLASE in the last 168 hours. No results for input(s): AMMONIA in the last 168 hours. Coagulation profile Recent Labs  Lab 06/26/24 1337  INR 1.2    CBC: Recent Labs  Lab 06/26/24 1337 06/27/24 0502 06/28/24 0536 06/30/24 0422  WBC 12.3* 10.0  8.4 13.4*  NEUTROABS 9.8*  --   --   --   HGB 8.7* 7.7* 7.7* 8.1*  HCT 29.2* 24.8* 25.3* 25.7*  MCV 81.6 80.8 81.1 79.1*  PLT 493* 404* 437* 472*   Cardiac Enzymes: Recent Labs  Lab 06/28/24 0536  CKTOTAL 75   BNP (last 3 results) No results for input(s): PROBNP in the last 8760 hours. CBG: Recent Labs  Lab 07/01/24 1721 07/01/24 2037 07/02/24 0423 07/02/24 0727 07/02/24 1141  GLUCAP 243* 142* 202* 129* 140*   D-Dimer: No results for input(s): DDIMER in the last 72 hours. Hgb A1c: No results for input(s): HGBA1C in the last 72 hours. Lipid Profile: No results for input(s): CHOL, HDL, LDLCALC, TRIG, CHOLHDL, LDLDIRECT in the last 72 hours. Thyroid  function studies: No results for input(s): TSH, T4TOTAL, T3FREE, THYROIDAB in the last 72 hours.  Invalid input(s): FREET3 Anemia work up: No results for input(s): VITAMINB12, FOLATE, FERRITIN, TIBC, IRON , RETICCTPCT in the last 72 hours. Sepsis Labs: Recent Labs  Lab 06/26/24 1337 06/26/24 1729 06/27/24 0502 06/28/24 0536 06/30/24 0422  WBC 12.3*  --  10.0 8.4 13.4*  LATICACIDVEN 1.6 0.9  --   --   --     Microbiology Recent Results (from the past 240 hours)  Culture, blood (Routine x 2)     Status: Abnormal (Preliminary result)   Collection Time: 06/26/24  1:37 PM   Specimen: BLOOD  Result Value Ref Range Status   Specimen Description   Final    BLOOD RIGHT ANTECUBITAL Performed at [facility], [address], [place], [place] [address]    Special Requests   Final    BOTTLES DRAWN AEROBIC AND ANAEROBIC Blood Culture adequate volume Performed at [facility], [address], [place], [place] [address]    Culture  Setup Time   Final  GRAM POSITIVE COCCI IN BOTH AEROBIC AND ANAEROBIC BOTTLES CRITICAL RESULT CALLED TO, READ BACK BY AND VERIFIED WITH: ERIN MEEGIN AT 1615 06/27/24.PMF Performed at [facility], [address]  [place], [place] [address]    Culture (A)  Final    METHICILLIN RESISTANT STAPHYLOCOCCUS AUREUS Sent to [facility] for further susceptibility testing. Performed at [facility], [address] [place], [place] [address]    Report Status PENDING  Incomplete   Organism ID, Bacteria METHICILLIN RESISTANT STAPHYLOCOCCUS AUREUS  Final      Susceptibility   Methicillin resistant staphylococcus aureus - MIC*    CIPROFLOXACIN  >=8 RESISTANT Resistant     ERYTHROMYCIN >=8 RESISTANT Resistant     GENTAMICIN <=0.5 SENSITIVE Sensitive     OXACILLIN >=4 RESISTANT Resistant     TETRACYCLINE >=16 RESISTANT Resistant     VANCOMYCIN  1 SENSITIVE Sensitive     TRIMETH/SULFA >=320 RESISTANT Resistant     CLINDAMYCIN <=0.25 SENSITIVE Sensitive     RIFAMPIN <=0.5 SENSITIVE Sensitive     Inducible Clindamycin NEGATIVE Sensitive     LINEZOLID  2 SENSITIVE Sensitive     * METHICILLIN RESISTANT STAPHYLOCOCCUS AUREUS  Blood Culture ID Panel (Reflexed)     Status: Abnormal   Collection Time: 06/26/24  1:37 PM  Result Value Ref Range Status   Enterococcus faecalis NOT DETECTED NOT DETECTED Final   Enterococcus Faecium NOT DETECTED NOT DETECTED Final   Listeria monocytogenes NOT DETECTED NOT DETECTED Final   Staphylococcus species DETECTED (A) NOT DETECTED Final    Comment: CRITICAL RESULT CALLED TO, READ BACK BY AND VERIFIED WITH: ERIN MEEGIN AT 1615 06/27/24.PMF    Staphylococcus aureus (BCID) DETECTED (A) NOT DETECTED Final    Comment: Methicillin (oxacillin)-resistant Staphylococcus aureus (MRSA). MRSA is predictably resistant to beta-lactam antibiotics (except ceftaroline). Preferred therapy is vancomycin  unless clinically contraindicated. Patient requires contact precautions if  hospitalized. CRITICAL RESULT CALLED TO, READ BACK BY AND VERIFIED WITH: ERIN MEEGIN AT 1615 06/27/24.PMF    Staphylococcus epidermidis NOT DETECTED NOT DETECTED Final   Staphylococcus lugdunensis NOT DETECTED NOT DETECTED Final    Streptococcus species NOT DETECTED NOT DETECTED Final   Streptococcus agalactiae NOT DETECTED NOT DETECTED Final   Streptococcus pneumoniae NOT DETECTED NOT DETECTED Final   Streptococcus pyogenes NOT DETECTED NOT DETECTED Final   A.calcoaceticus-baumannii NOT DETECTED NOT DETECTED Final   Bacteroides fragilis NOT DETECTED NOT DETECTED Final   Enterobacterales NOT DETECTED NOT DETECTED Final   Enterobacter cloacae complex NOT DETECTED NOT DETECTED Final   Escherichia coli NOT DETECTED NOT DETECTED Final   Klebsiella aerogenes NOT DETECTED NOT DETECTED Final   Klebsiella oxytoca NOT DETECTED NOT DETECTED Final   Klebsiella pneumoniae NOT DETECTED NOT DETECTED Final   Proteus species NOT DETECTED NOT DETECTED Final   Salmonella species NOT DETECTED NOT DETECTED Final   Serratia marcescens NOT DETECTED NOT DETECTED Final   Haemophilus influenzae NOT DETECTED NOT DETECTED Final   Neisseria meningitidis NOT DETECTED NOT DETECTED Final   Pseudomonas aeruginosa NOT DETECTED NOT DETECTED Final   Stenotrophomonas maltophilia NOT DETECTED NOT DETECTED Final   Candida albicans NOT DETECTED NOT DETECTED Final   Candida auris NOT DETECTED NOT DETECTED Final   Candida glabrata NOT DETECTED NOT DETECTED Final   Candida krusei NOT DETECTED NOT DETECTED Final   Candida parapsilosis NOT DETECTED NOT DETECTED Final   Candida tropicalis NOT DETECTED NOT DETECTED Final   Cryptococcus neoformans/gattii NOT DETECTED NOT DETECTED Final   Meth resistant mecA/C and MREJ DETECTED (  A) NOT DETECTED Final    Comment: CRITICAL RESULT CALLED TO, READ BACK BY AND VERIFIED WITH: ERIN MEEGIN AT 1615 06/27/24.PMF Performed at [facility], [address] [place], [place] [address]   Aerobic/Anaerobic Culture w Gram Stain (surgical/deep wound)     Status: None   Collection Time: 06/26/24  4:03 PM   Specimen: Foot  Result Value Ref Range Status   Specimen Description   Final    FOOT Performed at [facility], [address], [place], [place] [address]    Special Requests   Final    RIGHT FOOT Performed at [facility], [address] [place], [place] [address]    Gram Stain   Final    RARE WBC PRESENT, PREDOMINANTLY PMN NO ORGANISMS SEEN Performed at [facility], [address], [place], [place] [address]    Culture   Final    FEW STAPHYLOCOCCUS AUREUS MODERATE DIPHTHEROIDS(CORYNEBACTERIUM SPECIES) Standardized susceptibility testing for this organism is not available. RARE KLEBSIELLA PNEUMONIAE Confirmed Extended Spectrum Beta-Lactamase Producer (ESBL).  In bloodstream infections from ESBL organisms, carbapenems are preferred over piperacillin /tazobactam. They are shown to have a lower risk of mortality. RARE PROVIDENCIA STUARTII    Report Status 06/29/2024 FINAL  Final   Organism ID, Bacteria STAPHYLOCOCCUS AUREUS  Final   Organism ID, Bacteria KLEBSIELLA PNEUMONIAE  Final   Organism ID, Bacteria PROVIDENCIA STUARTII  Final      Susceptibility   Klebsiella pneumoniae - MIC*    AMPICILLIN  >=32 RESISTANT Resistant     CEFEPIME  >=32 RESISTANT Resistant     CEFTAZIDIME RESISTANT Resistant     CEFTRIAXONE  >=64 RESISTANT Resistant     CIPROFLOXACIN  >=4 RESISTANT Resistant     GENTAMICIN <=1 SENSITIVE Sensitive     IMIPENEM <=0.25 SENSITIVE Sensitive     TRIMETH/SULFA >=320 RESISTANT Resistant     AMPICILLIN /SULBACTAM >=32 RESISTANT Resistant     PIP/TAZO 16 SENSITIVE Sensitive ug/mL    * RARE KLEBSIELLA PNEUMONIAE   Providencia stuartii - MIC*    AMPICILLIN  >=32 RESISTANT Resistant     CEFEPIME  <=0.12 SENSITIVE Sensitive     CEFTAZIDIME <=1 SENSITIVE Sensitive     CEFTRIAXONE  <=0.25 SENSITIVE Sensitive     CIPROFLOXACIN  1 RESISTANT Resistant     GENTAMICIN RESISTANT Resistant     IMIPENEM 2 SENSITIVE Sensitive     TRIMETH/SULFA <=20 SENSITIVE Sensitive     AMPICILLIN /SULBACTAM >=32 RESISTANT Resistant     PIP/TAZO <=4 SENSITIVE Sensitive ug/mL     * RARE PROVIDENCIA STUARTII   Staphylococcus aureus - MIC*    CIPROFLOXACIN  >=8 RESISTANT Resistant     ERYTHROMYCIN >=8 RESISTANT Resistant     GENTAMICIN <=0.5 SENSITIVE Sensitive     OXACILLIN >=4 RESISTANT Resistant     TETRACYCLINE >=16 RESISTANT Resistant     VANCOMYCIN  1 SENSITIVE Sensitive     TRIMETH/SULFA >=320 RESISTANT Resistant     CLINDAMYCIN <=0.25 SENSITIVE Sensitive     RIFAMPIN <=0.5 SENSITIVE Sensitive     Inducible Clindamycin NEGATIVE Sensitive     LINEZOLID  2 SENSITIVE Sensitive     * FEW STAPHYLOCOCCUS AUREUS  Culture, blood (Routine x 2)     Status: None   Collection Time: 06/26/24 10:46 PM   Specimen: BLOOD  Result Value Ref Range Status   Specimen Description BLOOD BLOOD RIGHT ARM  Final   Special Requests   Final    BOTTLES DRAWN AEROBIC AND ANAEROBIC Blood Culture adequate volume   Culture   Final  NO GROWTH 5 DAYS Performed at [facility], [address] [place] [place], [place] [address]    Report Status 07/01/2024 FINAL  Final  Aerobic/Anaerobic Culture w Gram Stain (surgical/deep wound)     Status: None (Preliminary result)   Collection Time: 06/29/24 10:09 AM   Specimen: PATH Bone biopsy; Tissue  Result Value Ref Range Status   Specimen Description   Final    TISSUE Performed at [facility], [address] [place], [place] [address]    Special Requests   Final    BONE BOX Performed at [facility], [address] [place], [place] [address]    Gram Stain NO WBC SEEN NO ORGANISMS SEEN   Final   Culture   Final    FEW ESCHERICHIA COLI NO ANAEROBES ISOLATED Performed at [facility], [address] [place], [place] [address]    Report Status PENDING  Incomplete   Organism ID, Bacteria ESCHERICHIA COLI  Final      Susceptibility   Escherichia coli - MIC*    AMPICILLIN  <=2 SENSITIVE Sensitive     CEFEPIME  <=0.12 SENSITIVE Sensitive     CEFTAZIDIME <=1 SENSITIVE Sensitive     CEFTRIAXONE  <=0.25  SENSITIVE Sensitive     CIPROFLOXACIN  <=0.25 SENSITIVE Sensitive     GENTAMICIN <=1 SENSITIVE Sensitive     IMIPENEM <=0.25 SENSITIVE Sensitive     TRIMETH/SULFA <=20 SENSITIVE Sensitive     AMPICILLIN /SULBACTAM <=2 SENSITIVE Sensitive     PIP/TAZO <=4 SENSITIVE Sensitive ug/mL    * FEW ESCHERICHIA COLI  Culture, blood (Routine X 2) w Reflex to ID Panel     Status: None (Preliminary result)   Collection Time: 06/30/24  5:28 PM   Specimen: BLOOD  Result Value Ref Range Status   Specimen Description BLOOD BLOOD RIGHT ARM  Final   Special Requests   Final    BOTTLES DRAWN AEROBIC AND ANAEROBIC Blood Culture adequate volume   Culture   Final    NO GROWTH 2 DAYS Performed at [facility], [address] [place], [place] [address]    Report Status PENDING  Incomplete  Culture, blood (Routine X 2) w Reflex to ID Panel     Status: None (Preliminary result)   Collection Time: 06/30/24  5:33 PM   Specimen: BLOOD  Result Value Ref Range Status   Specimen Description BLOOD BLOOD LEFT ARM  Final   Special Requests   Final    BOTTLES DRAWN AEROBIC AND ANAEROBIC Blood Culture adequate volume   Culture   Final    NO GROWTH 2 DAYS Performed at [facility], [address] [place], [place] [address]    Report Status PENDING  Incomplete    Procedures and diagnostic studies:  ECHO TEE Result Date: 07/02/2024    TRANSESOPHOGEAL ECHO REPORT   Patient Name:   AKIN YI Date of Exam: 07/02/2024 Medical Rec #:  [id number]  Height:       70.0 in Accession #:    [id number] Weight:       225.0 lb Date of Birth:  10-02-1966  BSA:          2.194 m Patient Age:    58 years   BP:           120/79 mmHg Patient Gender: M          HR:           74 bpm. Exam Location:  [facility] Procedure: Transesophageal Echo, Cardiac  Doppler, Color Doppler and Saline            Contrast Bubble Study (Both Spectral and Color Flow Doppler were            utilized during procedure). Indications:     Bacteremia R78.81   History:         Patient has prior history of Echocardiogram examinations, most                  recent 06/28/2024. Risk Factors:Hypertension and Diabetes.  Sonographer:     Christopher Furnace Referring Phys:  [id number] [facility] CAREY Diagnosing Phys: Evalene Lunger MD PROCEDURE: After discussion of the risks and benefits of a TEE, an informed consent was obtained from the patient. TEE procedure time was 30 minutes. The transesophogeal probe was passed without difficulty through the esophogus of the patient. Imaged were obtained with the patient in a left lateral decubitus position. Local oropharyngeal anesthetic was provided with Cetacaine  and viscous lidocaine . Sedation performed by different physician. The patient was monitored while under deep sedation. Image quality was excellent. The patient's vital signs; including heart rate, blood pressure, and oxygen  saturation; remained stable throughout the procedure. The patient developed no complications during the procedure.  IMPRESSIONS  1. No valve vegetation noted  2. Left ventricular ejection fraction, by estimation, is 60 to 65%. The left ventricle has normal function. The left ventricle has no regional wall motion abnormalities.  3. Right ventricular systolic function is normal. The right ventricular size is normal.  4. No left atrial/left atrial appendage thrombus was detected.  5. The mitral valve is normal in structure. Trivial mitral valve regurgitation. No evidence of mitral stenosis.  6. The aortic valve is normal in structure. Aortic valve regurgitation is not visualized. No aortic stenosis is present.  7. The inferior vena cava is normal in size with greater than 50% respiratory variability, suggesting right atrial pressure of 3 mmHg.  8. Agitated saline contrast bubble study was negative, with no evidence of any interatrial shunt. Conclusion(s)/Recommendation(s): Normal biventricular function without evidence of hemodynamically significant valvular heart disease.  FINDINGS  Left Ventricle: Left ventricular ejection fraction, by estimation, is 60 to 65%. The left ventricle has normal function. The left ventricle has no regional wall motion abnormalities. The left ventricular internal cavity size was normal in size. There is  no left ventricular hypertrophy. Right Ventricle: The right ventricular size is normal. No increase in right ventricular wall thickness. Right ventricular systolic function is normal. Left Atrium: Left atrial size was normal in size. No left atrial/left atrial appendage thrombus was detected. Right Atrium: Right atrial size was normal in size. Pericardium: There is no evidence of pericardial effusion. Mitral Valve: The mitral valve is normal in structure. Trivial mitral valve regurgitation. No evidence of mitral valve stenosis. There is no evidence of mitral valve vegetation. Tricuspid Valve: The tricuspid valve is normal in structure. Tricuspid valve regurgitation is trivial. No evidence of tricuspid stenosis. There is no evidence of tricuspid valve vegetation. Aortic Valve: The aortic valve is normal in structure. Aortic valve regurgitation is not visualized. No aortic stenosis is present. There is no evidence of aortic valve vegetation. Pulmonic Valve: The pulmonic valve was normal in structure. Pulmonic valve regurgitation is trivial. No evidence of pulmonic stenosis. There is no evidence of pulmonic valve vegetation. Aorta: The aortic root is normal in size and structure. Venous: The inferior vena cava is normal in size with greater than 50% respiratory variability, suggesting right atrial pressure of  3 mmHg. IAS/Shunts: No atrial level shunt detected by color flow Doppler. Agitated saline contrast was given intravenously to evaluate for intracardiac shunting. Agitated saline contrast bubble study was negative, with no evidence of any interatrial shunt. There  is no evidence of a patent foramen ovale. There is no evidence of an atrial septal defect.  Additional Comments: 3D was performed not requiring image post processing on an independent workstation and was indeterminate. Evalene Lunger MD Electronically signed by Evalene Lunger MD Signature Date/Time: 07/02/2024/10:21:52 AM    Final                LOS: 6 days   AIDA CHO  [facility]   Pager on [email or web address]. If 7PM-7AM, please contact night-coverage at [email or web address]     07/02/2024, 12:27 PM

## 2024-07-02 NOTE — Progress Notes (Signed)
 Occupational Therapy Treatment Patient Details Name: [name] MRN: [id number] DOB: Nov 13, 1966 Today's Date: 07/02/2024   History of present illness Pt is a 58 y.o. who presented with redness, painn swelling, and discharge from the previous R TMA site on 06/26/24 and is now s/p revision TMA and debridement of chronic L foot ulcers with skin graft on 7/27. Per surgical note Pt is to be NWB for the most part at all times, but may apply a little bit of weight to his heels for transfers, but not to the balls of the feet. PMH includes: DM, DFI, MRSA bacteremia and MRSA foot infection, R TMA, left foot chronic wound.   OT comments  Pt and therapist engaged in extended discussion of pt's understanding of his weight-bearing precautions and awareness if the importance of these in order to avoid further amputation. Pt verbalizes understanding. Engaged in brainstorming session re: how pt could complete various tasks while adhering to precautions; pt is able to generate thoughtful, helpful ideas. Pt able to don/doff post-op shoes with Mod I. Pt's considerable UE strength will be important in maintain NWB ing status; however, pt cannot manage ADLs at home alone until b/l feet demonstrate improved healing. DC to facility where pt can receive care to perform ADLs and fxl mobility without weight-bearing is critical to his recovery; DC recs remain appropriate.       If plan is discharge home, recommend the following:  A lot of help with walking and/or transfers;A lot of help with bathing/dressing/bathroom;Assistance with cooking/housework;Help with stairs or ramp for entrance;Assist for transportation   Equipment Recommendations  Wheelchair (measurements OT);Wheelchair cushion (measurements OT)    Recommendations for Other Services      Precautions / Restrictions Precautions Precautions: Fall Recall of Precautions/Restrictions: Intact Restrictions Weight Bearing Restrictions Per Provider Order: Yes RLE  Weight Bearing Per Provider Order: Non weight bearing LLE Weight Bearing Per Provider Order: Non weight bearing Other Position/Activity Restrictions: per podiatrist pt is allowed heel WB for transfers only in post op shoes, no walking or WB through balls of the feet       Mobility Bed Mobility                    Transfers     Transfers: Bed to chair/wheelchair/BSC            Lateral/Scoot Transfers: Supervision General transfer comment: With cueing for WBing status     Balance                                           ADL either performed or assessed with clinical judgement   ADL Overall ADL's : Needs assistance/impaired Eating/Feeding: Independent;Set up                   Lower Body Dressing: Modified independent Lower Body Dressing Details (indicate cue type and reason): donning boot Toilet Transfer: Modified Independent Toilet Transfer Details (indicate cue type and reason): Pt using combo of pusing recliner to bathroom, using UE only, then RW for transfer to toilet, wearing post-op shoes and using slight pressure on heels only for balance during transfer.                Extremity/Trunk Assessment Upper Extremity Assessment Upper Extremity Assessment: Overall WFL for tasks assessed   Lower Extremity Assessment Lower Extremity Assessment: Overall WFL for tasks assessed;LLE deficits/detail;RLE deficits/detail RLE  Deficits / Details: NWBing Bilateral Feet LLE Deficits / Details: NWBing Bilateral Feet, chronic wound on L        Vision       Perception     Praxis     Communication Communication Communication: No apparent difficulties   Cognition Arousal: Alert Behavior During Therapy: WFL for tasks assessed/performed                                          Cueing      Exercises Other Exercises Other Exercises: Ongoing educ re: WBing instructions, importance of maintaining these, w/ pt verbalizing  understanding    Shoulder Instructions       General Comments      Pertinent Vitals/ Pain       Pain Assessment Pain Assessment: 0-10 Pain Score: 2  Pain Location: bilateral feet Pain Descriptors / Indicators: Sore Pain Intervention(s): Utilized relaxation techniques  Home Living                                          Prior Functioning/Environment              Frequency  Min 2X/week        Progress Toward Goals  OT Goals(current goals can now be found in the care plan section)  Progress towards OT goals: Progressing toward goals  Acute Rehab OT Goals Time For Goal Achievement: 07/14/24 Potential to Achieve Goals: Good  Plan      Co-evaluation                 AM-PAC OT 6 Clicks Daily Activity     Outcome Measure   Help from another person eating meals?: None Help from another person taking care of personal grooming?: A Little Help from another person toileting, which includes using toliet, bedpan, or urinal?: A Little Help from another person bathing (including washing, rinsing, drying)?: A Little Help from another person to put on and taking off regular upper body clothing?: None Help from another person to put on and taking off regular lower body clothing?: None 6 Click Score: 21    End of Session    OT Visit Diagnosis: Other abnormalities of gait and mobility (R26.89);Pain Pain - part of body: Ankle and joints of foot   Activity Tolerance Patient tolerated treatment well   Patient Left in chair;with call bell/phone within reach   Nurse Communication          Time: 8684-8664 OT Time Calculation (min): 20 min  Charges: OT General Charges $OT Visit: 1 Visit OT Treatments $Self Care/Home Management : 8-22 mins  Suzen Hock, PhD, MS, OTR/L 07/02/24, 1:52 PM

## 2024-07-02 NOTE — Plan of Care (Signed)
°  Problem: Education: Goal: Knowledge of General Education information will improve Description: Including pain rating scale, medication(s)/side effects and non-pharmacologic comfort measures Outcome: Progressing   Problem: Health Behavior/Discharge Planning: Goal: Ability to manage health-related needs will improve Outcome: Progressing   Problem: Clinical Measurements: Goal: Ability to maintain clinical measurements within normal limits will improve Outcome: Progressing Goal: Respiratory complications will improve Outcome: Progressing Goal: Cardiovascular complication will be avoided Outcome: Progressing   Problem: Activity: Goal: Risk for activity intolerance will decrease Outcome: Progressing   Problem: Nutrition: Goal: Adequate nutrition will be maintained Outcome: Progressing   Problem: Coping: Goal: Level of anxiety will decrease Outcome: Progressing   

## 2024-07-02 NOTE — Plan of Care (Signed)

## 2024-07-02 NOTE — Progress Notes (Signed)
 Date of Admission:  06/26/2024     ID: [name] is a 58 y.o. male  Principal Problem:   Acute osteomyelitis of right foot (HCC) Active Problems:   MRSA bacteremia   Essential hypertension   Dyslipidemia   Uncontrolled type 2 diabetes mellitus with hyperglycemia, with long-term current use of insulin  (HCC)   Wound infection   Endocarditis    Subjective: Doing well Had TEE No endocarditis  Medications:   ascorbic acid   500 mg Oral Daily   cyanocobalamin   1,000 mcg Oral Daily   enoxaparin  (LOVENOX ) injection  0.5 mg/kg Subcutaneous Q24H   folic acid   1 mg Oral Daily   gabapentin   300 mg Oral BID   insulin  aspart  0-15 Units Subcutaneous TID WC   insulin  aspart  0-5 Units Subcutaneous QHS   insulin  aspart  5 Units Subcutaneous TID WC   insulin  glargine-yfgn  25 Units Subcutaneous BID   iron  polysaccharides  150 mg Oral Daily   losartan   25 mg Oral Daily   mupirocin  ointment  1 Application Nasal BID   nitroGLYCERIN   0.2 mg Transdermal Daily   Vitamin D  (Ergocalciferol )  50,000 Units Oral Q Tue    Objective: Vital signs in last 24 hours: Patient Vitals for the past 24 hrs:  BP Temp Temp src Pulse Resp SpO2  07/02/24 1701 137/77 (!) 97.4 F (36.3 C) Oral 86 18 97 %  07/02/24 0937 (!) 142/87 97.8 F (36.6 C) Oral 81 18 100 %  07/02/24 0900 120/84 98.1 F (36.7 C) Temporal 80 20 98 %  07/02/24 0845 103/74 -- -- 79 15 97 %  07/02/24 0835 120/79 98.5 F (36.9 C) Temporal 74 18 98 %  07/02/24 0833 113/86 -- -- -- 15 98 %  07/02/24 0832 -- -- -- 74 17 98 %  07/02/24 0831 -- -- -- 74 17 98 %  07/02/24 0830 118/75 -- -- 75 18 98 %  07/02/24 0829 -- -- -- 75 17 97 %  07/02/24 0828 121/76 -- -- 75 17 98 %  07/02/24 0827 -- -- -- 75 16 98 %  07/02/24 0826 -- -- -- 75 17 98 %  07/02/24 0825 117/78 -- -- 75 17 99 %  07/02/24 0824 -- -- -- 75 17 98 %  07/02/24 0823 122/80 -- -- 76 17 99 %  07/02/24 0822 -- -- -- 76 17 99 %  07/02/24 0821 -- -- -- 76 17 99 %  07/02/24  0820 114/80 -- -- 76 17 99 %  07/02/24 0819 -- -- -- 77 17 99 %  07/02/24 0818 127/83 -- -- 79 (!) 21 100 %  07/02/24 0732 (!) 151/82 98 F (36.7 C) Oral 81 18 99 %  07/02/24 0420 (!) 152/79 98.9 F (37.2 C) -- 84 16 99 %  07/01/24 2035 (!) 146/79 98.6 F (37 C) -- 82 20 99 %     Lines and Device Date on insertion # of days DC  Engineer, technical sales     ETT       PHYSICAL EXAM:  General: Alert, cooperative, no distress, appears stated age.  Lungs: Clear to auscultation bilaterally. No Wheezing or Rhonchi. No rales. Heart: Regular rate and rhythm, no murmur, rub or gallop. Abdomen: Soft, non-tender,not distended. Bowel sounds normal. No masses Extremities:rt foot   Left foot has a graft- dressing note removed Skin: No rashes or lesions. Or bruising Lymph: Cervical, supraclavicular normal. Neurologic: Grossly non-focal  Lab Results    Latest Ref Rng & Units 06/30/2024    4:22 AM 06/28/2024    5:36 AM 06/27/2024    5:02 AM  CBC  WBC 4.0 - 10.5 K/uL 13.4  8.4  10.0   Hemoglobin 13.0 - 17.0 g/dL 8.1  7.7  7.7   Hematocrit 39.0 - 52.0 % 25.7  25.3  24.8   Platelets 150 - 400 K/uL 472  437  404        Latest Ref Rng & Units 07/02/2024   12:48 PM 06/30/2024    4:22 AM 06/28/2024    5:36 AM  CMP  Glucose 70 - 99 mg/dL 74  798  840   BUN 6 - 20 mg/dL 19  23  16    Creatinine 0.61 - 1.24 mg/dL 8.85  8.70  8.72   Sodium 135 - 145 mmol/L 138  139  134   Potassium 3.5 - 5.1 mmol/L 4.2  4.7  4.3   Chloride 98 - 111 mmol/L 104  103  102   CO2 22 - 32 mmol/L 28  26  27    Calcium  8.9 - 10.3 mg/dL 8.8  8.7  8.2       Microbiology: [facility]- 06/26/24 MRSA 7/24 Superficial wound culture MRSA, ESBl kleb 06/29/24 Tissue culture sent during surgery pending  Pathology reactive cortical and trabecular bone with no acute inflammation    Assessment/Plan:  MRSA bacteremia- secondary to rt foot infection recurrent 2 d echo N TEE Neg Repeat blood culture sent-  on 7/28  negcan get PICC as he will need 4-6 weeks of Iv antibiotic On daptomycin  ( MIC sent) Meropenem  Surgical culture pending     Infection rt TMA-  polymicrobial ESBL klebsiella, MRSA and dipheroids in superficial culture -underlying  osteomyelitis On meropenem  s/p revision TMA t Surgical bone culture pending  bone pathology no acute osteomyelitis   DM   Neuropathy     Lft foot ulcer chronic- had graft placement on 7/27     Pt needs to go to SNF to get IV and stay off his feet so that both his feet wounds can heal.  Discussed the management with patient and Dr.Ayiku

## 2024-07-02 NOTE — Progress Notes (Signed)
*  PRELIMINARY RESULTS* Echocardiogram Echocardiogram Transesophageal has been performed.  Floydene Harder 07/02/2024, 8:37 AM

## 2024-07-03 ENCOUNTER — Encounter: Payer: Self-pay | Admitting: Cardiovascular Disease

## 2024-07-03 DIAGNOSIS — R7881 Bacteremia: Secondary | ICD-10-CM | POA: Diagnosis not present

## 2024-07-03 DIAGNOSIS — B9562 Methicillin resistant Staphylococcus aureus infection as the cause of diseases classified elsewhere: Secondary | ICD-10-CM | POA: Diagnosis not present

## 2024-07-03 DIAGNOSIS — M86171 Other acute osteomyelitis, right ankle and foot: Secondary | ICD-10-CM | POA: Diagnosis not present

## 2024-07-03 DIAGNOSIS — T8743 Infection of amputation stump, right lower extremity: Secondary | ICD-10-CM | POA: Diagnosis not present

## 2024-07-03 LAB — GLUCOSE, CAPILLARY
Glucose-Capillary: 136 mg/dL — ABNORMAL HIGH (ref 70–99)
Glucose-Capillary: 168 mg/dL — ABNORMAL HIGH (ref 70–99)
Glucose-Capillary: 173 mg/dL — ABNORMAL HIGH (ref 70–99)
Glucose-Capillary: 175 mg/dL — ABNORMAL HIGH (ref 70–99)
Glucose-Capillary: 176 mg/dL — ABNORMAL HIGH (ref 70–99)
Glucose-Capillary: 252 mg/dL — ABNORMAL HIGH (ref 70–99)

## 2024-07-03 NOTE — Anesthesia Postprocedure Evaluation (Signed)
 Anesthesia Post Note  Patient: [name]  Procedure(s) Performed: ECHOCARDIOGRAM, TRANSESOPHAGEAL  Patient location during evaluation: Specials Recovery Anesthesia Type: General Level of consciousness: awake and alert Pain management: pain level controlled Vital Signs Assessment: post-procedure vital signs reviewed and stable Respiratory status: spontaneous breathing, nonlabored ventilation, respiratory function stable and patient connected to nasal cannula oxygen  Cardiovascular status: blood pressure returned to baseline and stable Postop Assessment: no apparent nausea or vomiting Anesthetic complications: no   No notable events documented.   Last Vitals:  Vitals:   07/03/24 0502 07/03/24 0832  BP: 136/75 129/83  Pulse: 82 85  Resp: 18 18  Temp: (!) 36.2 C 36.7 C  SpO2: 100% 100%    Last Pain:  Vitals:   07/03/24 1115  TempSrc:   PainSc: 3                  Fairy POUR Endre Coutts

## 2024-07-03 NOTE — Progress Notes (Signed)
 Date of Admission:  06/26/2024     ID: [name] is a 58 y.o. male  Principal Problem:   Acute osteomyelitis of right foot (HCC) Active Problems:   MRSA bacteremia   Essential hypertension   Dyslipidemia   Uncontrolled type 2 diabetes mellitus with hyperglycemia, with long-term current use of insulin  (HCC)   Wound infection   Endocarditis    Subjective: Doing well Had TEE No endocarditis  Medications:   ascorbic acid   500 mg Oral Daily   cyanocobalamin   1,000 mcg Oral Daily   enoxaparin  (LOVENOX ) injection  0.5 mg/kg Subcutaneous Q24H   folic acid   1 mg Oral Daily   gabapentin   300 mg Oral BID   insulin  aspart  0-15 Units Subcutaneous TID WC   insulin  aspart  0-5 Units Subcutaneous QHS   insulin  aspart  5 Units Subcutaneous TID WC   insulin  glargine-yfgn  25 Units Subcutaneous BID   iron  polysaccharides  150 mg Oral Daily   losartan   25 mg Oral Daily   mupirocin  ointment  1 Application Nasal BID   nitroGLYCERIN   0.2 mg Transdermal Daily   Vitamin D  (Ergocalciferol )  50,000 Units Oral Q Tue    Objective: Vital signs in last 24 hours: Patient Vitals for the past 24 hrs:  BP Temp Temp src Pulse Resp SpO2  07/03/24 0832 129/83 98 F (36.7 C) Oral 85 18 100 %  07/03/24 0502 136/75 (!) 97.1 F (36.2 C) -- 82 18 100 %  07/02/24 2057 130/65 98.3 F (36.8 C) Oral 88 18 100 %  07/02/24 1701 137/77 (!) 97.4 F (36.3 C) Oral 86 18 97 %     Lines and Device Date on insertion # of days DC  Chiropractor     Foley     ETT       PHYSICAL EXAM:  General: Alert, cooperative, no distress, appears stated age.  Lungs: Clear to auscultation bilaterally. No Wheezing or Rhonchi. No rales. Heart: Regular rate and rhythm, no murmur, rub or gallop. Abdomen: Soft, non-tender,not distended. Bowel sounds normal. No masses Extremities:rt foot   Left foot has a graft- dressing note removed Skin: No rashes or lesions. Or bruising Lymph: Cervical, supraclavicular  normal. Neurologic: Grossly non-focal  Lab Results    Latest Ref Rng & Units 06/30/2024    4:22 AM 06/28/2024    5:36 AM 06/27/2024    5:02 AM  CBC  WBC 4.0 - 10.5 K/uL 13.4  8.4  10.0   Hemoglobin 13.0 - 17.0 g/dL 8.1  7.7  7.7   Hematocrit 39.0 - 52.0 % 25.7  25.3  24.8   Platelets 150 - 400 K/uL 472  437  404        Latest Ref Rng & Units 07/02/2024   12:48 PM 06/30/2024    4:22 AM 06/28/2024    5:36 AM  CMP  Glucose 70 - 99 mg/dL 74  798  840   BUN 6 - 20 mg/dL 19  23  16    Creatinine 0.61 - 1.24 mg/dL 8.85  8.70  8.72   Sodium 135 - 145 mmol/L 138  139  134   Potassium 3.5 - 5.1 mmol/L 4.2  4.7  4.3   Chloride 98 - 111 mmol/L 104  103  102   CO2 22 - 32 mmol/L 28  26  27    Calcium  8.9 - 10.3 mg/dL 8.8  8.7  8.2       Microbiology: [facility]- 06/26/24 MRSA  7/24 Superficial wound culture MRSA, ESBl kleb 06/29/24 Tissue culture sent during surgery pending  Pathology reactive cortical and trabecular bone with no acute inflammation    Assessment/Plan:  MRSA bacteremia- secondary to rt foot infection recurrent 2 d echo N TEE Neg Repeat blood culture sent-  on 7/28 neg except for a gram pos identified after 60 hrs- which lis likely a contaminant- ID pending  need 4-6 weeks of Iv antibiotic On daptomycin  ( MIC sent) Meropenem - can be changed to ertapenem  Will need both for 406 weeks IV     Infection rt TMA-  polymicrobial ESBL klebsiella, MRSA and dipheroids in superficial culture -underlying  osteomyelitis On meropenem  s/p revision TMA t Surgical bone culture pending  bone pathology no acute osteomyelitis   DM   Neuropathy     Lft foot ulcer chronic- had graft placement on 7/27     Pt needs to go to SNF to get IV and stay off his feet so that both his feet wounds can heal.  Discussed the management with patient and Dr.Ayiku

## 2024-07-03 NOTE — Plan of Care (Signed)

## 2024-07-03 NOTE — Plan of Care (Signed)

## 2024-07-03 NOTE — Progress Notes (Signed)
 Progress Note    [name]  FMW:[id number] DOB: Mar 29, 1966  DOA: 06/26/2024 PCP: [name], MD      Brief Narrative:    Medical records reviewed and are as summarized below:  [name] is a 58 y.o. male with medical history significant for hypertension, dyslipidemia, tobacco abuse, stage IIIa chronic kidney disease, cocaine abuse, and type 2 diabetes mellitus, who presented to the emergency room with acute onset of right foot pain with drainage from a recent toe amputation wound of clear fluid.  No fever or chills.    On presentation stable vital, labs with hyperglycemia at 345, creatinine 1.42, albumin 2.8, leukocytosis at 12.3 and hemoglobin of 8.7.  Wound and blood cultures were obtained.   MRI of the foot : 1. Status post interval second through fifth ray amputation at the level of the mid metatarsal shafts. Associated soft tissue wound extending along the dorsal stump into the deep soft tissues overlying the second through fifth digit metatarsal transsection margins which demonstrate T2/STIR hyperintense marrow edema with corresponding T1 hypointensity. These findings are concerning for osteomyelitis of the remnant second through fifth metatarsals, although, recent postsurgical change can demonstrate a similar appearance. Cellulitis cannot be excluded. No drainable abscess. 2. Diffuse nonspecific increased T2 signal of the intrinsic musculature of the foot, myositis cannot be excluded. 3. Tenosynovitis of the posterior tibialis, flexor digitorum, and flexor hallucis longus tendons.   He was admitted to the hospital for right foot osteomyelitis.  Blood culture showed MRSA consistent with MRSA bacteremia.   Assessment/Plan:   Principal Problem:   Acute osteomyelitis of right foot (HCC) Active Problems:   MRSA bacteremia   Uncontrolled type 2 diabetes mellitus with hyperglycemia, with long-term current use of insulin  (HCC)   Essential hypertension    Dyslipidemia   Wound infection   Endocarditis    Body mass index is 32.28 kg/m.  (Class I obesity)   Acute right foot osteomyelitis, MRSA bacteremia: S/p right revision transmetatarsal amputation, left foot subcutaneous wound debridement with biopsy, left foot wound graft placement on 06/29/2024.  Surgical wound culture showed E. coli that is pansensitive. S/p TEE on 07/02/2024 which was negative for vegetations.  Preserved EF. Repeat blood culture from 06/30/2024- no growth thus far. He is still on IV daptomycin  and IV meropenem . Patient will need PICC line for long-term antibiotics.  Will hold off on PICC line until patient is close to being discharged from the hospital. Patient will need SNF at discharge.  Follow-up with TOC for SNF placement.     Type II DM with hyperglycemia: Continue Semglee  and NovoLog .  Use NovoLog  as needed for hyperglycemia.   Comorbidities include hypertension, dyslipidemia   Diet Order             Diet Carb Modified Fluid consistency: Thin; Room service appropriate? Yes  Diet effective now                            Consultants: ID specialist Cardiologist  Procedures: TEE on 07/02/2024    Medications:    ascorbic acid   500 mg Oral Daily   cyanocobalamin   1,000 mcg Oral Daily   enoxaparin  (LOVENOX ) injection  0.5 mg/kg Subcutaneous Q24H   folic acid   1 mg Oral Daily   gabapentin   300 mg Oral BID   insulin  aspart  0-15 Units Subcutaneous TID WC   insulin  aspart  0-5 Units Subcutaneous QHS   insulin  aspart  5  Units Subcutaneous TID WC   insulin  glargine-yfgn  25 Units Subcutaneous BID   iron  polysaccharides  150 mg Oral Daily   losartan   25 mg Oral Daily   mupirocin  ointment  1 Application Nasal BID   nitroGLYCERIN   0.2 mg Transdermal Daily   Vitamin D  (Ergocalciferol )  50,000 Units Oral Q Tue   Continuous Infusions:  DAPTOmycin  Stopped (07/02/24 1339)   meropenem  (MERREM ) IV 1 g (07/03/24 0609)     Anti-infectives  (From admission, onward)    Start     Dose/Rate Route Frequency Ordered Stop   06/28/24 1145  DAPTOmycin  (CUBICIN ) 800 mg in sodium chloride  0.9 % IVPB        8 mg/kg  102.1 kg 132 mL/hr over 30 Minutes Intravenous Daily 06/28/24 1051     06/27/24 2200  linezolid  (ZYVOX ) tablet 600 mg  Status:  Discontinued        600 mg Oral Every 12 hours 06/27/24 1003 06/28/24 1048   06/27/24 1300  meropenem  (MERREM ) 1 g in sodium chloride  0.9 % 100 mL IVPB        1 g 200 mL/hr over 30 Minutes Intravenous Every 8 hours 06/27/24 0958     06/27/24 0000  vancomycin  (VANCOCIN ) IVPB 1000 mg/200 mL premix       Placed in Followed by Linked Group   1,000 mg 200 mL/hr over 60 Minutes Intravenous  Once 06/26/24 2127 06/27/24 0111   06/26/24 2215  piperacillin -tazobactam (ZOSYN ) IVPB 3.375 g  Status:  Discontinued       Placed in And Linked Group   3.375 g 100 mL/hr over 30 Minutes Intravenous  Once 06/26/24 2118 06/26/24 2127   06/26/24 2215  vancomycin  (VANCOREADY) IVPB 1250 mg/250 mL       Placed in Followed by Linked Group   1,250 mg 166.7 mL/hr over 90 Minutes Intravenous  Once 06/26/24 2127 06/27/24 0000   06/26/24 2215  piperacillin -tazobactam (ZOSYN ) IVPB 3.375 g  Status:  Discontinued        3.375 g 12.5 mL/hr over 240 Minutes Intravenous Every 8 hours 06/26/24 2127 06/27/24 0958   06/26/24 2141  vancomycin  variable dose per unstable renal function (pharmacist dosing)  Status:  Discontinued         Does not apply See admin instructions 06/26/24 2141 06/27/24 1004   06/26/24 1930  ciprofloxacin  (CIPRO ) IVPB 400 mg        400 mg 200 mL/hr over 60 Minutes Intravenous  Once 06/26/24 1923 06/26/24 2047              Family Communication/Anticipated D/C date and plan/Code Status   DVT prophylaxis:      Code Status: Full Code  Family Communication: None Disposition Plan: Plans to go to SNF   Status is: Inpatient Remains inpatient appropriate because: MRSA bacteremia, right foot  osteomyelitis       Subjective:   Interval events noted.  No complaints.  He is just waiting to go to SNF.  Objective:    Vitals:   07/02/24 1701 07/02/24 2057 07/03/24 0502 07/03/24 0832  BP: 137/77 130/65 136/75 129/83  Pulse: 86 88 82 85  Resp: 18 18 18 18   Temp: (!) 97.4 F (36.3 C) 98.3 F (36.8 C) (!) 97.1 F (36.2 C) 98 F (36.7 C)  TempSrc: Oral Oral  Oral  SpO2: 97% 100% 100% 100%  Weight:      Height:       No data found.   Intake/Output Summary (Last  24 hours) at 07/03/2024 1320 Last data filed at 07/03/2024 0900 Gross per 24 hour  Intake 502.66 ml  Output --  Net 502.66 ml   Filed Weights   06/26/24 1335  Weight: 102.1 kg    Exam:  GEN: NAD, sitting up in the chair SKIN: Warm and dry EYES: No pallor or icterus ENT: MMM CV: RRR PULM: CTA B ABD: soft, ND, NT, +BS CNS: AAO x 3, non focal EXT: Dressing on the feet is clean, dry and intact       Data Reviewed:   I have personally reviewed following labs and imaging studies:  Labs: Labs show the following:   Basic Metabolic Panel: Recent Labs  Lab 06/26/24 1337 06/27/24 0502 06/28/24 0536 06/30/24 0422 07/02/24 1248  NA 135 137 134* 139 138  K 5.0 4.1 4.3 4.7 4.2  CL 100 101 102 103 104  CO2 24 28 27 26 28   GLUCOSE 345* 140* 159* 201* 74  BUN 17 17 16  23* 19  CREATININE 1.42* 1.38* 1.27* 1.29* 1.14  CALCIUM  8.7* 8.3* 8.2* 8.7* 8.8*   GFR Estimated Creatinine Clearance: 85.5 mL/min (by C-G formula based on SCr of 1.14 mg/dL). Liver Function Tests: Recent Labs  Lab 06/26/24 1337  AST 11*  ALT 9  ALKPHOS 88  BILITOT 0.4  PROT 7.5  ALBUMIN 2.8*   No results for input(s): LIPASE, AMYLASE in the last 168 hours. No results for input(s): AMMONIA in the last 168 hours. Coagulation profile Recent Labs  Lab 06/26/24 1337  INR 1.2    CBC: Recent Labs  Lab 06/26/24 1337 06/27/24 0502 06/28/24 0536 06/30/24 0422  WBC 12.3* 10.0 8.4 13.4*  NEUTROABS 9.8*  --    --   --   HGB 8.7* 7.7* 7.7* 8.1*  HCT 29.2* 24.8* 25.3* 25.7*  MCV 81.6 80.8 81.1 79.1*  PLT 493* 404* 437* 472*   Cardiac Enzymes: Recent Labs  Lab 06/28/24 0536  CKTOTAL 75   BNP (last 3 results) No results for input(s): PROBNP in the last 8760 hours. CBG: Recent Labs  Lab 07/02/24 2105 07/03/24 0004 07/03/24 0506 07/03/24 0829 07/03/24 1157  GLUCAP 169* 173* 176* 168* 136*   D-Dimer: No results for input(s): DDIMER in the last 72 hours. Hgb A1c: No results for input(s): HGBA1C in the last 72 hours. Lipid Profile: No results for input(s): CHOL, HDL, LDLCALC, TRIG, CHOLHDL, LDLDIRECT in the last 72 hours. Thyroid  function studies: No results for input(s): TSH, T4TOTAL, T3FREE, THYROIDAB in the last 72 hours.  Invalid input(s): FREET3 Anemia work up: No results for input(s): VITAMINB12, FOLATE, FERRITIN, TIBC, IRON , RETICCTPCT in the last 72 hours. Sepsis Labs: Recent Labs  Lab 06/26/24 1337 06/26/24 1729 06/27/24 0502 06/28/24 0536 06/30/24 0422  WBC 12.3*  --  10.0 8.4 13.4*  LATICACIDVEN 1.6 0.9  --   --   --     Microbiology Recent Results (from the past 240 hours)  Culture, blood (Routine x 2)     Status: Abnormal (Preliminary result)   Collection Time: 06/26/24  1:37 PM   Specimen: BLOOD  Result Value Ref Range Status   Specimen Description   Final    BLOOD RIGHT ANTECUBITAL Performed at [facility], [address] [place], [place] [address]    Special Requests   Final    BOTTLES DRAWN AEROBIC AND ANAEROBIC Blood Culture adequate volume Performed at [facility], [address], [place], [place] [address]    Culture  Setup Time  Final    GRAM POSITIVE COCCI IN BOTH AEROBIC AND ANAEROBIC BOTTLES CRITICAL RESULT CALLED TO, READ BACK BY AND VERIFIED WITH: ERIN MEEGIN AT 1615 06/27/24.PMF Performed at [facility], [address] [place], [place] [address]    Culture (A)   Final    METHICILLIN RESISTANT STAPHYLOCOCCUS AUREUS Sent to [facility] for further susceptibility testing. Performed at [facility], [address] [place], [place] [address]    Report Status PENDING  Incomplete   Organism ID, Bacteria METHICILLIN RESISTANT STAPHYLOCOCCUS AUREUS  Final      Susceptibility   Methicillin resistant staphylococcus aureus - MIC*    CIPROFLOXACIN  >=8 RESISTANT Resistant     ERYTHROMYCIN >=8 RESISTANT Resistant     GENTAMICIN <=0.5 SENSITIVE Sensitive     OXACILLIN >=4 RESISTANT Resistant     TETRACYCLINE >=16 RESISTANT Resistant     VANCOMYCIN  1 SENSITIVE Sensitive     TRIMETH/SULFA >=320 RESISTANT Resistant     CLINDAMYCIN <=0.25 SENSITIVE Sensitive     RIFAMPIN <=0.5 SENSITIVE Sensitive     Inducible Clindamycin NEGATIVE Sensitive     LINEZOLID  2 SENSITIVE Sensitive     * METHICILLIN RESISTANT STAPHYLOCOCCUS AUREUS  Blood Culture ID Panel (Reflexed)     Status: Abnormal   Collection Time: 06/26/24  1:37 PM  Result Value Ref Range Status   Enterococcus faecalis NOT DETECTED NOT DETECTED Final   Enterococcus Faecium NOT DETECTED NOT DETECTED Final   Listeria monocytogenes NOT DETECTED NOT DETECTED Final   Staphylococcus species DETECTED (A) NOT DETECTED Final    Comment: CRITICAL RESULT CALLED TO, READ BACK BY AND VERIFIED WITH: ERIN MEEGIN AT 1615 06/27/24.PMF    Staphylococcus aureus (BCID) DETECTED (A) NOT DETECTED Final    Comment: Methicillin (oxacillin)-resistant Staphylococcus aureus (MRSA). MRSA is predictably resistant to beta-lactam antibiotics (except ceftaroline). Preferred therapy is vancomycin  unless clinically contraindicated. Patient requires contact precautions if  hospitalized. CRITICAL RESULT CALLED TO, READ BACK BY AND VERIFIED WITH: ERIN MEEGIN AT 1615 06/27/24.PMF    Staphylococcus epidermidis NOT DETECTED NOT DETECTED Final   Staphylococcus lugdunensis NOT DETECTED NOT DETECTED Final   Streptococcus species NOT DETECTED NOT  DETECTED Final   Streptococcus agalactiae NOT DETECTED NOT DETECTED Final   Streptococcus pneumoniae NOT DETECTED NOT DETECTED Final   Streptococcus pyogenes NOT DETECTED NOT DETECTED Final   A.calcoaceticus-baumannii NOT DETECTED NOT DETECTED Final   Bacteroides fragilis NOT DETECTED NOT DETECTED Final   Enterobacterales NOT DETECTED NOT DETECTED Final   Enterobacter cloacae complex NOT DETECTED NOT DETECTED Final   Escherichia coli NOT DETECTED NOT DETECTED Final   Klebsiella aerogenes NOT DETECTED NOT DETECTED Final   Klebsiella oxytoca NOT DETECTED NOT DETECTED Final   Klebsiella pneumoniae NOT DETECTED NOT DETECTED Final   Proteus species NOT DETECTED NOT DETECTED Final   Salmonella species NOT DETECTED NOT DETECTED Final   Serratia marcescens NOT DETECTED NOT DETECTED Final   Haemophilus influenzae NOT DETECTED NOT DETECTED Final   Neisseria meningitidis NOT DETECTED NOT DETECTED Final   Pseudomonas aeruginosa NOT DETECTED NOT DETECTED Final   Stenotrophomonas maltophilia NOT DETECTED NOT DETECTED Final   Candida albicans NOT DETECTED NOT DETECTED Final   Candida auris NOT DETECTED NOT DETECTED Final   Candida glabrata NOT DETECTED NOT DETECTED Final   Candida krusei NOT DETECTED NOT DETECTED Final   Candida parapsilosis NOT DETECTED NOT DETECTED Final   Candida tropicalis NOT DETECTED NOT DETECTED Final   Cryptococcus neoformans/gattii NOT DETECTED NOT DETECTED Final   Meth resistant  mecA/C and MREJ DETECTED (A) NOT DETECTED Final    Comment: CRITICAL RESULT CALLED TO, READ BACK BY AND VERIFIED WITH: ERIN MEEGIN AT 1615 06/27/24.PMF Performed at [facility], [address], [place], [place] [address]   Aerobic/Anaerobic Culture w Gram Stain (surgical/deep wound)     Status: None   Collection Time: 06/26/24  4:03 PM   Specimen: Foot  Result Value Ref Range Status   Specimen Description   Final    FOOT Performed at [facility], [address]  [place], [place] [address]    Special Requests   Final    RIGHT FOOT Performed at [facility], [address] [place], [place] [address]    Gram Stain   Final    RARE WBC PRESENT, PREDOMINANTLY PMN NO ORGANISMS SEEN Performed at [facility], [address] [place], [place] [address]    Culture   Final    FEW STAPHYLOCOCCUS AUREUS MODERATE DIPHTHEROIDS(CORYNEBACTERIUM SPECIES) Standardized susceptibility testing for this organism is not available. RARE KLEBSIELLA PNEUMONIAE Confirmed Extended Spectrum Beta-Lactamase Producer (ESBL).  In bloodstream infections from ESBL organisms, carbapenems are preferred over piperacillin /tazobactam. They are shown to have a lower risk of mortality. RARE PROVIDENCIA STUARTII    Report Status 06/29/2024 FINAL  Final   Organism ID, Bacteria STAPHYLOCOCCUS AUREUS  Final   Organism ID, Bacteria KLEBSIELLA PNEUMONIAE  Final   Organism ID, Bacteria PROVIDENCIA STUARTII  Final      Susceptibility   Klebsiella pneumoniae - MIC*    AMPICILLIN  >=32 RESISTANT Resistant     CEFEPIME  >=32 RESISTANT Resistant     CEFTAZIDIME RESISTANT Resistant     CEFTRIAXONE  >=64 RESISTANT Resistant     CIPROFLOXACIN  >=4 RESISTANT Resistant     GENTAMICIN <=1 SENSITIVE Sensitive     IMIPENEM <=0.25 SENSITIVE Sensitive     TRIMETH/SULFA >=320 RESISTANT Resistant     AMPICILLIN /SULBACTAM >=32 RESISTANT Resistant     PIP/TAZO 16 SENSITIVE Sensitive ug/mL    * RARE KLEBSIELLA PNEUMONIAE   Providencia stuartii - MIC*    AMPICILLIN  >=32 RESISTANT Resistant     CEFEPIME  <=0.12 SENSITIVE Sensitive     CEFTAZIDIME <=1 SENSITIVE Sensitive     CEFTRIAXONE  <=0.25 SENSITIVE Sensitive     CIPROFLOXACIN  1 RESISTANT Resistant     GENTAMICIN RESISTANT Resistant     IMIPENEM 2 SENSITIVE Sensitive     TRIMETH/SULFA <=20 SENSITIVE Sensitive     AMPICILLIN /SULBACTAM >=32 RESISTANT Resistant     PIP/TAZO <=4 SENSITIVE Sensitive ug/mL    * RARE PROVIDENCIA STUARTII    Staphylococcus aureus - MIC*    CIPROFLOXACIN  >=8 RESISTANT Resistant     ERYTHROMYCIN >=8 RESISTANT Resistant     GENTAMICIN <=0.5 SENSITIVE Sensitive     OXACILLIN >=4 RESISTANT Resistant     TETRACYCLINE >=16 RESISTANT Resistant     VANCOMYCIN  1 SENSITIVE Sensitive     TRIMETH/SULFA >=320 RESISTANT Resistant     CLINDAMYCIN <=0.25 SENSITIVE Sensitive     RIFAMPIN <=0.5 SENSITIVE Sensitive     Inducible Clindamycin NEGATIVE Sensitive     LINEZOLID  2 SENSITIVE Sensitive     * FEW STAPHYLOCOCCUS AUREUS  Culture, blood (Routine x 2)     Status: None   Collection Time: 06/26/24 10:46 PM   Specimen: BLOOD  Result Value Ref Range Status   Specimen Description BLOOD BLOOD RIGHT ARM  Final   Special Requests   Final    BOTTLES DRAWN AEROBIC AND ANAEROBIC Blood Culture adequate volume  Culture   Final    NO GROWTH 5 DAYS Performed at [facility], [address] [place] [place], [place] [address]    Report Status 07/01/2024 FINAL  Final  Aerobic/Anaerobic Culture w Gram Stain (surgical/deep wound)     Status: None (Preliminary result)   Collection Time: 06/29/24 10:09 AM   Specimen: PATH Bone biopsy; Tissue  Result Value Ref Range Status   Specimen Description   Final    TISSUE Performed at [facility], [address] [place], [place] [address]    Special Requests   Final    BONE BOX Performed at [facility], [address] [place], [place] [address]    Gram Stain   Final    NO WBC SEEN NO ORGANISMS SEEN Performed at [facility], [address], [place], [place] [address]    Culture   Final    FEW ESCHERICHIA COLI NO ANAEROBES ISOLATED; CULTURE IN PROGRESS FOR 5 DAYS    Report Status PENDING  Incomplete   Organism ID, Bacteria ESCHERICHIA COLI  Final      Susceptibility   Escherichia coli - MIC*    AMPICILLIN  <=2 SENSITIVE Sensitive     CEFEPIME  <=0.12 SENSITIVE Sensitive     CEFTAZIDIME <=1 SENSITIVE Sensitive     CEFTRIAXONE  <=0.25  SENSITIVE Sensitive     CIPROFLOXACIN  <=0.25 SENSITIVE Sensitive     GENTAMICIN <=1 SENSITIVE Sensitive     IMIPENEM <=0.25 SENSITIVE Sensitive     TRIMETH/SULFA <=20 SENSITIVE Sensitive     AMPICILLIN /SULBACTAM <=2 SENSITIVE Sensitive     PIP/TAZO <=4 SENSITIVE Sensitive ug/mL    * FEW ESCHERICHIA COLI  Culture, blood (Routine X 2) w Reflex to ID Panel     Status: None (Preliminary result)   Collection Time: 06/30/24  5:28 PM   Specimen: BLOOD  Result Value Ref Range Status   Specimen Description BLOOD BLOOD RIGHT ARM  Final   Special Requests   Final    BOTTLES DRAWN AEROBIC AND ANAEROBIC Blood Culture adequate volume   Culture  Setup Time   Final    GRAM POSITIVE RODS AEROBIC BOTTLE ONLY CRITICAL RESULT CALLED TO, READ BACK BY AND VERIFIED WITHBETHA RANKIN ARGYLE PHARMD [id number] 07/03/24 HNM    Culture   Final    NO GROWTH 3 DAYS Performed at [facility], [address] [place], [place] [address]    Report Status PENDING  Incomplete  Culture, blood (Routine X 2) w Reflex to ID Panel     Status: None (Preliminary result)   Collection Time: 06/30/24  5:33 PM   Specimen: BLOOD  Result Value Ref Range Status   Specimen Description BLOOD BLOOD LEFT ARM  Final   Special Requests   Final    BOTTLES DRAWN AEROBIC AND ANAEROBIC Blood Culture adequate volume   Culture   Final    NO GROWTH 3 DAYS Performed at [facility], [address] [place], [place] [address]    Report Status PENDING  Incomplete    Procedures and diagnostic studies:  ECHO TEE Result Date: 07/02/2024    TRANSESOPHOGEAL ECHO REPORT   Patient Name:   IVERY NANNEY Date of Exam: 07/02/2024 Medical Rec #:  [id number]  Height:       70.0 in Accession #:    [id number] Weight:       225.0 lb Date of Birth:  Apr 20, 1966  BSA:          2.194 m Patient Age:  58 years   BP:           120/79 mmHg Patient Gender: M          HR:           74 bpm. Exam Location:  [facility] Procedure: Transesophageal Echo, Cardiac Doppler,  Color Doppler and Saline            Contrast Bubble Study (Both Spectral and Color Flow Doppler were            utilized during procedure). Indications:     Bacteremia R78.81  History:         Patient has prior history of Echocardiogram examinations, most                  recent 06/28/2024. Risk Factors:Hypertension and Diabetes.  Sonographer:     Christopher Furnace Referring Phys:  [id number] [facility] L CAREY Diagnosing Phys: Evalene Lunger MD PROCEDURE: After discussion of the risks and benefits of a TEE, an informed consent was obtained from the patient. TEE procedure time was 30 minutes. The transesophogeal probe was passed without difficulty through the esophogus of the patient. Imaged were obtained with the patient in a left lateral decubitus position. Local oropharyngeal anesthetic was provided with Cetacaine  and viscous lidocaine . Sedation performed by different physician. The patient was monitored while under deep sedation. Image quality was excellent. The patient's vital signs; including heart rate, blood pressure, and oxygen  saturation; remained stable throughout the procedure. The patient developed no complications during the procedure.  IMPRESSIONS  1. No valve vegetation noted  2. Left ventricular ejection fraction, by estimation, is 60 to 65%. The left ventricle has normal function. The left ventricle has no regional wall motion abnormalities.  3. Right ventricular systolic function is normal. The right ventricular size is normal.  4. No left atrial/left atrial appendage thrombus was detected.  5. The mitral valve is normal in structure. Trivial mitral valve regurgitation. No evidence of mitral stenosis.  6. The aortic valve is normal in structure. Aortic valve regurgitation is not visualized. No aortic stenosis is present.  7. The inferior vena cava is normal in size with greater than 50% respiratory variability, suggesting right atrial pressure of 3 mmHg.  8. Agitated saline contrast bubble study was negative,  with no evidence of any interatrial shunt. Conclusion(s)/Recommendation(s): Normal biventricular function without evidence of hemodynamically significant valvular heart disease. FINDINGS  Left Ventricle: Left ventricular ejection fraction, by estimation, is 60 to 65%. The left ventricle has normal function. The left ventricle has no regional wall motion abnormalities. The left ventricular internal cavity size was normal in size. There is  no left ventricular hypertrophy. Right Ventricle: The right ventricular size is normal. No increase in right ventricular wall thickness. Right ventricular systolic function is normal. Left Atrium: Left atrial size was normal in size. No left atrial/left atrial appendage thrombus was detected. Right Atrium: Right atrial size was normal in size. Pericardium: There is no evidence of pericardial effusion. Mitral Valve: The mitral valve is normal in structure. Trivial mitral valve regurgitation. No evidence of mitral valve stenosis. There is no evidence of mitral valve vegetation. Tricuspid Valve: The tricuspid valve is normal in structure. Tricuspid valve regurgitation is trivial. No evidence of tricuspid stenosis. There is no evidence of tricuspid valve vegetation. Aortic Valve: The aortic valve is normal in structure. Aortic valve regurgitation is not visualized. No aortic stenosis is present. There is no evidence of aortic valve vegetation. Pulmonic Valve: The pulmonic valve was normal  in structure. Pulmonic valve regurgitation is trivial. No evidence of pulmonic stenosis. There is no evidence of pulmonic valve vegetation. Aorta: The aortic root is normal in size and structure. Venous: The inferior vena cava is normal in size with greater than 50% respiratory variability, suggesting right atrial pressure of 3 mmHg. IAS/Shunts: No atrial level shunt detected by color flow Doppler. Agitated saline contrast was given intravenously to evaluate for intracardiac shunting. Agitated saline  contrast bubble study was negative, with no evidence of any interatrial shunt. There  is no evidence of a patent foramen ovale. There is no evidence of an atrial septal defect. Additional Comments: 3D was performed not requiring image post processing on an independent workstation and was indeterminate. Evalene Lunger MD Electronically signed by Evalene Lunger MD Signature Date/Time: 07/02/2024/10:21:52 AM    Final                LOS: 7 days   AIDA CHO  [facility]   Pager on [email or web address]. If 7PM-7AM, please contact night-coverage at [email or web address]     07/03/2024, 1:20 PM

## 2024-07-03 NOTE — Progress Notes (Signed)
 PHARMACY - PHYSICIAN COMMUNICATION CRITICAL VALUE ALERT - BLOOD CULTURE IDENTIFICATION (BCID)  Trusten Hume is an 59 y.o. male who presented to [facility] on 06/26/2024 with a chief complaint of foot infection  Assessment:  Patient being treated for MRSA bacteremia and foot infection, repeat blood cultures from 7/28 (to evaluate clearance of MRSA) with GPR in 1 of 4 bottles.  Name of physician (or Provider) Contacted: Drs Fayette and Jens  Current antibiotics: daptomycin  and meropenem   Changes to prescribed antibiotics recommended:  Patient is on recommended antibiotics - No changes needed - susected contaminant  Lakyn Mantione, PharmD, BCPS, BCIDP Work Cell: [phone] 07/03/2024 9:17 AM

## 2024-07-04 DIAGNOSIS — M86171 Other acute osteomyelitis, right ankle and foot: Secondary | ICD-10-CM | POA: Diagnosis not present

## 2024-07-04 DIAGNOSIS — T8743 Infection of amputation stump, right lower extremity: Secondary | ICD-10-CM | POA: Diagnosis not present

## 2024-07-04 DIAGNOSIS — R7881 Bacteremia: Secondary | ICD-10-CM | POA: Diagnosis not present

## 2024-07-04 DIAGNOSIS — B9562 Methicillin resistant Staphylococcus aureus infection as the cause of diseases classified elsewhere: Secondary | ICD-10-CM | POA: Diagnosis not present

## 2024-07-04 LAB — AEROBIC/ANAEROBIC CULTURE W GRAM STAIN (SURGICAL/DEEP WOUND): Gram Stain: NONE SEEN

## 2024-07-04 LAB — GLUCOSE, CAPILLARY
Glucose-Capillary: 174 mg/dL — ABNORMAL HIGH (ref 70–99)
Glucose-Capillary: 128 mg/dL — ABNORMAL HIGH (ref 70–99)
Glucose-Capillary: 168 mg/dL — ABNORMAL HIGH (ref 70–99)
Glucose-Capillary: 165 mg/dL — ABNORMAL HIGH (ref 70–99)

## 2024-07-04 MED ORDER — SODIUM CHLORIDE 0.9 % IV SOLN
1.0000 g | INTRAVENOUS | Status: DC
  Administered 2024-07-05 – 2024-07-14 (×11): 1 g via INTRAVENOUS
  Filled 2024-07-04 (×10): qty 1000

## 2024-07-04 NOTE — Progress Notes (Signed)
 Occupational Therapy Treatment Patient Details Name: [name] MRN: [id number] DOB: 16-Oct-1966 Today's Date: 07/04/2024   History of present illness Pt is a 58 y.o. who presented with redness, painn swelling, and discharge from the previous R TMA site on 06/26/24 and is now s/p revision TMA and debridement of chronic L foot ulcers with skin graft on 7/27. Per surgical note Pt is to be NWB for the most part at all times, but may apply a little bit of weight to his heels for transfers, but not to the balls of the feet. PMH includes: DM, DFI, MRSA bacteremia and MRSA foot infection, R TMA, left foot chronic wound.   OT comments  Pt is supine in bed on arrival. Pleasant and agreeable to OT session. He denies pain. Pt performed bed mobility MOD I. Pt required supervision for combo of lateral scoot and squat pivot to [facility] and then to recliner with extensive education on W/C transfers vs BSC transfers only and no use of RW to maintain NWB to BLEs and heel weight bearing only. Pt verbalized understanding. May have family bring in personal W/C for use with toilet transfers to prevent use of RW and weight bearing through BLEs. Pt is high risk for noncompliance and bil foot wounds not feeling if he were to return home alone. He was left in recliner with all needs in place and will cont to require skilled acute OT services to maximize his safety and IND to return to PLOF.       If plan is discharge home, recommend the following:  A lot of help with walking and/or transfers;A lot of help with bathing/dressing/bathroom;Assistance with cooking/housework;Help with stairs or ramp for entrance;Assist for transportation   Equipment Recommendations  Wheelchair (measurements OT);Wheelchair cushion (measurements OT)    Recommendations for Other Services      Precautions / Restrictions Precautions Precautions: Fall Recall of Precautions/Restrictions: Intact Restrictions Weight Bearing Restrictions Per Provider  Order: Yes RLE Weight Bearing Per Provider Order: Non weight bearing LLE Weight Bearing Per Provider Order: Non weight bearing Other Position/Activity Restrictions: per podiatrist pt is allowed heel WB for transfers only in post op shoes, no walking or WB through balls of the feet       Mobility Bed Mobility Overal bed mobility: Independent                  Transfers Overall transfer level: Needs assistance   Transfers: Bed to chair/wheelchair/BSC     Squat pivot transfers: Supervision      Lateral/Scoot Transfers: Supervision General transfer comment: supervision for combo of lateral scoot and squat pivot to [facility] and then to recliner with extensive education on W/C transfers vs BSC transfers only and no use of RW to maintain NWB to BLEs and heel weight beraing only     Balance Overall balance assessment: No apparent balance deficits (not formally assessed)                                         ADL either performed or assessed with clinical judgement   ADL Overall ADL's : Needs assistance/impaired                         Toilet Transfer: Radiographer, therapeutic Details (indicate cue type and reason): edu on no walking to the toilet on his feet at this time, okay to  lateral scoot/SPT with heel weight bearing only to [facility] or via W/C transport to commode for SPT                Extremity/Trunk Assessment              Vision       Perception     Praxis     Communication Communication Communication: No apparent difficulties   Cognition Arousal: Alert Behavior During Therapy: WFL for tasks assessed/performed                                 Following commands: Intact        Cueing   Cueing Techniques: Verbal cues  Exercises Other Exercises Other Exercises: Pt education/reinforcement on the importance of BLE WB status compliance    Shoulder Instructions       General Comments       Pertinent Vitals/ Pain       Pain Assessment Pain Assessment: Faces Faces Pain Scale: Hurts a little bit Pain Location: bilateral feet Pain Descriptors / Indicators: Sore Pain Intervention(s): Monitored during session, Limited activity within patient's tolerance  Home Living                                          Prior Functioning/Environment              Frequency  Min 2X/week        Progress Toward Goals  OT Goals(current goals can now be found in the care plan section)  Progress towards OT goals: Progressing toward goals  Acute Rehab OT Goals Patient Stated Goal: heal wounds OT Goal Formulation: With patient Time For Goal Achievement: 07/14/24 Potential to Achieve Goals: Good  Plan      Co-evaluation                 AM-PAC OT 6 Clicks Daily Activity     Outcome Measure   Help from another person eating meals?: None Help from another person taking care of personal grooming?: None Help from another person toileting, which includes using toliet, bedpan, or urinal?: A Little Help from another person bathing (including washing, rinsing, drying)?: A Little Help from another person to put on and taking off regular upper body clothing?: None Help from another person to put on and taking off regular lower body clothing?: None 6 Click Score: 22    End of Session    OT Visit Diagnosis: Other abnormalities of gait and mobility (R26.89);Pain Pain - Right/Left: Right Pain - part of body: Ankle and joints of foot   Activity Tolerance Patient tolerated treatment well   Patient Left in chair;with call bell/phone within reach   Nurse Communication Mobility status        Time: 1051-1110 OT Time Calculation (min): 19 min  Charges: OT General Charges $OT Visit: 1 Visit OT Treatments $Self Care/Home Management : 8-22 mins  Kimberlyann Hollar, OTR/L  07/04/24, 3:14 PM   Selene Peltzer E Noland Pizano 07/04/2024, 3:12 PM

## 2024-07-04 NOTE — Progress Notes (Signed)
 Date of Admission:  06/26/2024     ID: [name] is a 58 y.o. male  Principal Problem:   Acute osteomyelitis of right foot (HCC) Active Problems:   MRSA bacteremia   Essential hypertension   Dyslipidemia   Uncontrolled type 2 diabetes mellitus with hyperglycemia, with long-term current use of insulin  (HCC)   Wound infection   Endocarditis    Subjective: Doing well Had TEE No endocarditis  Medications:   ascorbic acid   500 mg Oral Daily   cyanocobalamin   1,000 mcg Oral Daily   enoxaparin  (LOVENOX ) injection  0.5 mg/kg Subcutaneous Q24H   folic acid   1 mg Oral Daily   gabapentin   300 mg Oral BID   insulin  aspart  0-15 Units Subcutaneous TID WC   insulin  aspart  0-5 Units Subcutaneous QHS   insulin  aspart  5 Units Subcutaneous TID WC   insulin  glargine-yfgn  25 Units Subcutaneous BID   iron  polysaccharides  150 mg Oral Daily   losartan   25 mg Oral Daily   mupirocin  ointment  1 Application Nasal BID   nitroGLYCERIN   0.2 mg Transdermal Daily   Vitamin D  (Ergocalciferol )  50,000 Units Oral Q Tue    Objective: Vital signs in last 24 hours: Patient Vitals for the past 24 hrs:  BP Temp Temp src Pulse Resp SpO2  07/04/24 0752 (!) 147/87 98.3 F (36.8 C) Oral 82 16 100 %  07/04/24 0310 133/63 97.9 F (36.6 C) Oral 90 18 99 %  07/03/24 1937 (!) 153/90 98.4 F (36.9 C) -- 93 14 99 %  07/03/24 1702 125/69 98.3 F (36.8 C) -- 86 18 100 %     Lines and Device Date on insertion # of days DC  Engineer, technical sales     ETT       PHYSICAL EXAM:  General: Alert, cooperative, no distress, appears stated age.  Lungs: Clear to auscultation bilaterally. No Wheezing or Rhonchi. No rales. Heart: Regular rate and rhythm, no murmur, rub or gallop. Abdomen: Soft, non-tender,not distended. Bowel sounds normal. No masses Extremities:rt foot   Left foot has a graft- dressing note removed Skin: No rashes or lesions. Or bruising Lymph: Cervical, supraclavicular  normal. Neurologic: Grossly non-focal  Lab Results    Latest Ref Rng & Units 06/30/2024    4:22 AM 06/28/2024    5:36 AM 06/27/2024    5:02 AM  CBC  WBC 4.0 - 10.5 K/uL 13.4  8.4  10.0   Hemoglobin 13.0 - 17.0 g/dL 8.1  7.7  7.7   Hematocrit 39.0 - 52.0 % 25.7  25.3  24.8   Platelets 150 - 400 K/uL 472  437  404        Latest Ref Rng & Units 07/02/2024   12:48 PM 06/30/2024    4:22 AM 06/28/2024    5:36 AM  CMP  Glucose 70 - 99 mg/dL 74  798  840   BUN 6 - 20 mg/dL 19  23  16    Creatinine 0.61 - 1.24 mg/dL 8.85  8.70  8.72   Sodium 135 - 145 mmol/L 138  139  134   Potassium 3.5 - 5.1 mmol/L 4.2  4.7  4.3   Chloride 98 - 111 mmol/L 104  103  102   CO2 22 - 32 mmol/L 28  26  27    Calcium  8.9 - 10.3 mg/dL 8.8  8.7  8.2       Microbiology: [facility]- 06/26/24 MRSA  7/24 Superficial wound culture MRSA, ESBl kleb 06/29/24 Tissue culture sent during surgery pending  Pathology reactive cortical and trabecular bone with no acute inflammation    Assessment/Plan:  MRSA bacteremia- secondary to rt foot infection recurrent 2 d echo N TEE Neg Repeat blood culture sent-  on 7/28 neg except for a gram pos identified after 60 hrs- which lis likely a contaminant- ID pending  need 4-6 weeks of Iv antibiotic On daptomycin  ( MIC sent) Meropenem - can be changed to ertapenem  Will need both for 406 weeks IV     Infection rt TMA-  polymicrobial ESBL klebsiella, MRSA and dipheroids in superficial culture -underlying  osteomyelitis On meropenem , can change to ertapenem for total of 6 weeks s/p revision TMA t Surgical bone culture Ecoli  bone pathology no acute osteomyelitis    DM   Neuropathy     Lft foot ulcer chronic- had graft placement on 7/27     Pt needs to go to SNF to get IV and stay off his feet so that both his feet wounds can heal.  Discussed the management with patient and Dr.Ayiku OPAT note placed Place PICC line once discharge date is finalized Follow up with me as OP   on 07/24/24  ID will not see him this weekend Call if needed

## 2024-07-04 NOTE — Progress Notes (Signed)
 PHARMACY CONSULT NOTE FOR:  OUTPATIENT  PARENTERAL ANTIBIOTIC THERAPY (OPAT)  Indication: MRSA bacteremia and diabetic foot infection Regimen:  Daptomycin  800mg  IV q24h Ertapenem 1gm IV q24h End date: 08/06/2024  Labs - Once weekly:  CBC/D, CK, CMP, CRP, ESR Please pull PIC at completion of IV antibiotic Fax weekly lab results promptly to [phone] and [phone] Call [phone] with any critical value  IV antibiotic discharge orders are pended. To discharging provider:  please sign these orders via discharge navigator,  Select New Orders & click on the button choice - Manage This Unsigned Work.     Thank you for allowing pharmacy to be a part of this patient's care.  Arihana Ambrocio, PharmD, BCPS, BCIDP Work Cell: [phone] 07/04/2024 2:39 PM

## 2024-07-04 NOTE — Progress Notes (Addendum)
 Progress Note    [name]  FMW:[id number] DOB: 1966-07-15  DOA: 06/26/2024 PCP: [name], MD      Brief Narrative:    Medical records reviewed and are as summarized below:  [name] is a 58 y.o. male with medical history significant for hypertension, dyslipidemia, tobacco abuse, stage IIIa chronic kidney disease, cocaine abuse, and type 2 diabetes mellitus, who presented to the emergency room with acute onset of right foot pain with drainage from a recent toe amputation wound of clear fluid.  No fever or chills.    On presentation stable vital, labs with hyperglycemia at 345, creatinine 1.42, albumin 2.8, leukocytosis at 12.3 and hemoglobin of 8.7.  Wound and blood cultures were obtained.   MRI of the foot : 1. Status post interval second through fifth ray amputation at the level of the mid metatarsal shafts. Associated soft tissue wound extending along the dorsal stump into the deep soft tissues overlying the second through fifth digit metatarsal transsection margins which demonstrate T2/STIR hyperintense marrow edema with corresponding T1 hypointensity. These findings are concerning for osteomyelitis of the remnant second through fifth metatarsals, although, recent postsurgical change can demonstrate a similar appearance. Cellulitis cannot be excluded. No drainable abscess. 2. Diffuse nonspecific increased T2 signal of the intrinsic musculature of the foot, myositis cannot be excluded. 3. Tenosynovitis of the posterior tibialis, flexor digitorum, and flexor hallucis longus tendons.   He was admitted to the hospital for right foot osteomyelitis.  Blood culture showed MRSA consistent with MRSA bacteremia.   Assessment/Plan:   Principal Problem:   Acute osteomyelitis of right foot (HCC) Active Problems:   MRSA bacteremia   Uncontrolled type 2 diabetes mellitus with hyperglycemia, with long-term current use of insulin  (HCC)   Essential hypertension    Dyslipidemia   Wound infection   Endocarditis    Body mass index is 32.28 kg/m.  (Class I obesity)   Acute right foot infection/cellulitis, MRSA bacteremia: S/p right revision transmetatarsal amputation, left foot subcutaneous wound debridement with biopsy, left foot wound graft placement on 06/29/2024.  Surgical wound culture showed E. coli that is pansensitive. No acute inflammation or evidence of osteomyelitis on surgical pathology. S/p TEE on 07/02/2024 which was negative for vegetations.  Preserved EF. Repeat blood culture from 06/30/2024-1 out of 4 bottles growing gram-positive rods but this is likely a contaminant. He is on IV daptomycin  and IV meropenem . Patient will need PICC line for long-term antibiotics.  Will hold off on PICC line until patient is close to being discharged from the hospital. Patient will need SNF at discharge.  Follow-up with TOC for SNF placement.     Type II DM with hyperglycemia: Continue Semglee  and NovoLog .  Use NovoLog  as needed for hyperglycemia.   Comorbidities include hypertension, dyslipidemia   Diet Order             Diet Carb Modified Fluid consistency: Thin; Room service appropriate? Yes  Diet effective now                            Consultants: ID specialist Cardiologist  Procedures: TEE on 07/02/2024    Medications:    ascorbic acid   500 mg Oral Daily   cyanocobalamin   1,000 mcg Oral Daily   enoxaparin  (LOVENOX ) injection  0.5 mg/kg Subcutaneous Q24H   folic acid   1 mg Oral Daily   gabapentin   300 mg Oral BID   insulin  aspart  0-15 Units  Subcutaneous TID WC   insulin  aspart  0-5 Units Subcutaneous QHS   insulin  aspart  5 Units Subcutaneous TID WC   insulin  glargine-yfgn  25 Units Subcutaneous BID   iron  polysaccharides  150 mg Oral Daily   losartan   25 mg Oral Daily   mupirocin  ointment  1 Application Nasal BID   nitroGLYCERIN   0.2 mg Transdermal Daily   Vitamin D  (Ergocalciferol )  50,000 Units Oral Q Tue    Continuous Infusions:  DAPTOmycin  Stopped (07/03/24 1520)   [START ON 07/05/2024] ertapenem     meropenem  (MERREM ) IV 1 g (07/04/24 0543)     Anti-infectives (From admission, onward)    Start     Dose/Rate Route Frequency Ordered Stop   07/05/24 0600  ertapenem (INVANZ) 1 g in sodium chloride  0.9 % 100 mL IVPB        1 g 200 mL/hr over 30 Minutes Intravenous Every 24 hours 07/04/24 0934     06/28/24 1145  DAPTOmycin  (CUBICIN ) 800 mg in sodium chloride  0.9 % IVPB        8 mg/kg  102.1 kg 132 mL/hr over 30 Minutes Intravenous Daily 06/28/24 1051     06/27/24 2200  linezolid  (ZYVOX ) tablet 600 mg  Status:  Discontinued        600 mg Oral Every 12 hours 06/27/24 1003 06/28/24 1048   06/27/24 1300  meropenem  (MERREM ) 1 g in sodium chloride  0.9 % 100 mL IVPB        1 g 200 mL/hr over 30 Minutes Intravenous Every 8 hours 06/27/24 0958 07/05/24 0559   06/27/24 0000  vancomycin  (VANCOCIN ) IVPB 1000 mg/200 mL premix       Placed in Followed by Linked Group   1,000 mg 200 mL/hr over 60 Minutes Intravenous  Once 06/26/24 2127 06/27/24 0111   06/26/24 2215  piperacillin -tazobactam (ZOSYN ) IVPB 3.375 g  Status:  Discontinued       Placed in And Linked Group   3.375 g 100 mL/hr over 30 Minutes Intravenous  Once 06/26/24 2118 06/26/24 2127   06/26/24 2215  vancomycin  (VANCOREADY) IVPB 1250 mg/250 mL       Placed in Followed by Linked Group   1,250 mg 166.7 mL/hr over 90 Minutes Intravenous  Once 06/26/24 2127 06/27/24 0000   06/26/24 2215  piperacillin -tazobactam (ZOSYN ) IVPB 3.375 g  Status:  Discontinued        3.375 g 12.5 mL/hr over 240 Minutes Intravenous Every 8 hours 06/26/24 2127 06/27/24 0958   06/26/24 2141  vancomycin  variable dose per unstable renal function (pharmacist dosing)  Status:  Discontinued         Does not apply See admin instructions 06/26/24 2141 06/27/24 1004   06/26/24 1930  ciprofloxacin  (CIPRO ) IVPB 400 mg        400 mg 200 mL/hr over 60 Minutes  Intravenous  Once 06/26/24 1923 06/26/24 2047              Family Communication/Anticipated D/C date and plan/Code Status   DVT prophylaxis:      Code Status: Full Code  Family Communication: None Disposition Plan: Plans to go to SNF   Status is: Inpatient Remains inpatient appropriate because: MRSA bacteremia, right foot osteomyelitis       Subjective:   No acute events overnight.  He has no complaints.  Objective:    Vitals:   07/03/24 1702 07/03/24 1937 07/04/24 0310 07/04/24 0752  BP: 125/69 (!) 153/90 133/63 (!) 147/87  Pulse: 86 93 90  82  Resp: 18 14 18 16   Temp: 98.3 F (36.8 C) 98.4 F (36.9 C) 97.9 F (36.6 C) 98.3 F (36.8 C)  TempSrc:   Oral Oral  SpO2: 100% 99% 99% 100%  Weight:      Height:       No data found.   Intake/Output Summary (Last 24 hours) at 07/04/2024 1049 Last data filed at 07/04/2024 0900 Gross per 24 hour  Intake 960 ml  Output --  Net 960 ml   Filed Weights   06/26/24 1335  Weight: 102.1 kg    Exam:   GEN: NAD SKIN: Warm and dry EYES: No pallor or icterus ENT: MMM CV: RRR PULM: CTA B ABD: soft, ND, NT, +BS CNS: AAO x 3, non focal EXT: Dressing on the feet clean, dry and intact       Data Reviewed:   I have personally reviewed following labs and imaging studies:  Labs: Labs show the following:   Basic Metabolic Panel: Recent Labs  Lab 06/28/24 0536 06/30/24 0422 07/02/24 1248  NA 134* 139 138  K 4.3 4.7 4.2  CL 102 103 104  CO2 27 26 28   GLUCOSE 159* 201* 74  BUN 16 23* 19  CREATININE 1.27* 1.29* 1.14  CALCIUM  8.2* 8.7* 8.8*   GFR Estimated Creatinine Clearance: 85.5 mL/min (by C-G formula based on SCr of 1.14 mg/dL). Liver Function Tests: No results for input(s): AST, ALT, ALKPHOS, BILITOT, PROT, ALBUMIN in the last 168 hours.  No results for input(s): LIPASE, AMYLASE in the last 168 hours. No results for input(s): AMMONIA in the last 168 hours. Coagulation  profile No results for input(s): INR, PROTIME in the last 168 hours.   CBC: Recent Labs  Lab 06/28/24 0536 06/30/24 0422  WBC 8.4 13.4*  HGB 7.7* 8.1*  HCT 25.3* 25.7*  MCV 81.1 79.1*  PLT 437* 472*   Cardiac Enzymes: Recent Labs  Lab 06/28/24 0536  CKTOTAL 75   BNP (last 3 results) No results for input(s): PROBNP in the last 8760 hours. CBG: Recent Labs  Lab 07/03/24 0829 07/03/24 1157 07/03/24 1651 07/03/24 2220 07/04/24 0754  GLUCAP 168* 136* 175* 252* 174*   D-Dimer: No results for input(s): DDIMER in the last 72 hours. Hgb A1c: No results for input(s): HGBA1C in the last 72 hours. Lipid Profile: No results for input(s): CHOL, HDL, LDLCALC, TRIG, CHOLHDL, LDLDIRECT in the last 72 hours. Thyroid  function studies: No results for input(s): TSH, T4TOTAL, T3FREE, THYROIDAB in the last 72 hours.  Invalid input(s): FREET3 Anemia work up: No results for input(s): VITAMINB12, FOLATE, FERRITIN, TIBC, IRON , RETICCTPCT in the last 72 hours. Sepsis Labs: Recent Labs  Lab 06/28/24 0536 06/30/24 0422  WBC 8.4 13.4*    Microbiology Recent Results (from the past 240 hours)  Culture, blood (Routine x 2)     Status: Abnormal (Preliminary result)   Collection Time: 06/26/24  1:37 PM   Specimen: BLOOD  Result Value Ref Range Status   Specimen Description   Final    BLOOD RIGHT ANTECUBITAL Performed at [facility], [address] [place], [place] [address]    Special Requests   Final    BOTTLES DRAWN AEROBIC AND ANAEROBIC Blood Culture adequate volume Performed at [facility], [address] [place], [place] [address]    Culture  Setup Time   Final    GRAM POSITIVE COCCI IN BOTH AEROBIC AND ANAEROBIC BOTTLES CRITICAL RESULT CALLED TO, READ BACK BY AND VERIFIED WITH:  ERIN MEEGIN AT 1615 06/27/24.PMF Performed at [facility], [address] [place], [place] [address]    Culture (A)   Final    METHICILLIN RESISTANT STAPHYLOCOCCUS AUREUS Sent to [facility] for further susceptibility testing. Performed at [facility], [address] [place], [place] [address]    Report Status PENDING  Incomplete   Organism ID, Bacteria METHICILLIN RESISTANT STAPHYLOCOCCUS AUREUS  Final      Susceptibility   Methicillin resistant staphylococcus aureus - MIC*    CIPROFLOXACIN  >=8 RESISTANT Resistant     ERYTHROMYCIN >=8 RESISTANT Resistant     GENTAMICIN <=0.5 SENSITIVE Sensitive     OXACILLIN >=4 RESISTANT Resistant     TETRACYCLINE >=16 RESISTANT Resistant     VANCOMYCIN  1 SENSITIVE Sensitive     TRIMETH/SULFA >=320 RESISTANT Resistant     CLINDAMYCIN <=0.25 SENSITIVE Sensitive     RIFAMPIN <=0.5 SENSITIVE Sensitive     Inducible Clindamycin NEGATIVE Sensitive     LINEZOLID  2 SENSITIVE Sensitive     * METHICILLIN RESISTANT STAPHYLOCOCCUS AUREUS  Blood Culture ID Panel (Reflexed)     Status: Abnormal   Collection Time: 06/26/24  1:37 PM  Result Value Ref Range Status   Enterococcus faecalis NOT DETECTED NOT DETECTED Final   Enterococcus Faecium NOT DETECTED NOT DETECTED Final   Listeria monocytogenes NOT DETECTED NOT DETECTED Final   Staphylococcus species DETECTED (A) NOT DETECTED Final    Comment: CRITICAL RESULT CALLED TO, READ BACK BY AND VERIFIED WITH: ERIN MEEGIN AT 1615 06/27/24.PMF    Staphylococcus aureus (BCID) DETECTED (A) NOT DETECTED Final    Comment: Methicillin (oxacillin)-resistant Staphylococcus aureus (MRSA). MRSA is predictably resistant to beta-lactam antibiotics (except ceftaroline). Preferred therapy is vancomycin  unless clinically contraindicated. Patient requires contact precautions if  hospitalized. CRITICAL RESULT CALLED TO, READ BACK BY AND VERIFIED WITH: ERIN MEEGIN AT 1615 06/27/24.PMF    Staphylococcus epidermidis NOT DETECTED NOT DETECTED Final   Staphylococcus lugdunensis NOT DETECTED NOT DETECTED Final   Streptococcus species NOT DETECTED NOT  DETECTED Final   Streptococcus agalactiae NOT DETECTED NOT DETECTED Final   Streptococcus pneumoniae NOT DETECTED NOT DETECTED Final   Streptococcus pyogenes NOT DETECTED NOT DETECTED Final   A.calcoaceticus-baumannii NOT DETECTED NOT DETECTED Final   Bacteroides fragilis NOT DETECTED NOT DETECTED Final   Enterobacterales NOT DETECTED NOT DETECTED Final   Enterobacter cloacae complex NOT DETECTED NOT DETECTED Final   Escherichia coli NOT DETECTED NOT DETECTED Final   Klebsiella aerogenes NOT DETECTED NOT DETECTED Final   Klebsiella oxytoca NOT DETECTED NOT DETECTED Final   Klebsiella pneumoniae NOT DETECTED NOT DETECTED Final   Proteus species NOT DETECTED NOT DETECTED Final   Salmonella species NOT DETECTED NOT DETECTED Final   Serratia marcescens NOT DETECTED NOT DETECTED Final   Haemophilus influenzae NOT DETECTED NOT DETECTED Final   Neisseria meningitidis NOT DETECTED NOT DETECTED Final   Pseudomonas aeruginosa NOT DETECTED NOT DETECTED Final   Stenotrophomonas maltophilia NOT DETECTED NOT DETECTED Final   Candida albicans NOT DETECTED NOT DETECTED Final   Candida auris NOT DETECTED NOT DETECTED Final   Candida glabrata NOT DETECTED NOT DETECTED Final   Candida krusei NOT DETECTED NOT DETECTED Final   Candida parapsilosis NOT DETECTED NOT DETECTED Final   Candida tropicalis NOT DETECTED NOT DETECTED Final   Cryptococcus neoformans/gattii NOT DETECTED NOT DETECTED Final   Meth resistant mecA/C and MREJ DETECTED (A) NOT DETECTED Final    Comment: CRITICAL RESULT CALLED TO, READ BACK BY AND VERIFIED WITH: ERIN  MEEGIN AT 1615 06/27/24.PMF Performed at [facility], [address], [place], [place] [address]   Aerobic/Anaerobic Culture w Gram Stain (surgical/deep wound)     Status: None   Collection Time: 06/26/24  4:03 PM   Specimen: Foot  Result Value Ref Range Status   Specimen Description   Final    FOOT Performed at [facility], [address],  [place], [place] [address]    Special Requests   Final    RIGHT FOOT Performed at [facility], [address] [place], [place] [address]    Gram Stain   Final    RARE WBC PRESENT, PREDOMINANTLY PMN NO ORGANISMS SEEN Performed at [facility], [address], [place], [place] [address]    Culture   Final    FEW STAPHYLOCOCCUS AUREUS MODERATE DIPHTHEROIDS(CORYNEBACTERIUM SPECIES) Standardized susceptibility testing for this organism is not available. RARE KLEBSIELLA PNEUMONIAE Confirmed Extended Spectrum Beta-Lactamase Producer (ESBL).  In bloodstream infections from ESBL organisms, carbapenems are preferred over piperacillin /tazobactam. They are shown to have a lower risk of mortality. RARE PROVIDENCIA STUARTII    Report Status 06/29/2024 FINAL  Final   Organism ID, Bacteria STAPHYLOCOCCUS AUREUS  Final   Organism ID, Bacteria KLEBSIELLA PNEUMONIAE  Final   Organism ID, Bacteria PROVIDENCIA STUARTII  Final      Susceptibility   Klebsiella pneumoniae - MIC*    AMPICILLIN  >=32 RESISTANT Resistant     CEFEPIME  >=32 RESISTANT Resistant     CEFTAZIDIME RESISTANT Resistant     CEFTRIAXONE  >=64 RESISTANT Resistant     CIPROFLOXACIN  >=4 RESISTANT Resistant     GENTAMICIN <=1 SENSITIVE Sensitive     IMIPENEM <=0.25 SENSITIVE Sensitive     TRIMETH/SULFA >=320 RESISTANT Resistant     AMPICILLIN /SULBACTAM >=32 RESISTANT Resistant     PIP/TAZO 16 SENSITIVE Sensitive ug/mL    * RARE KLEBSIELLA PNEUMONIAE   Providencia stuartii - MIC*    AMPICILLIN  >=32 RESISTANT Resistant     CEFEPIME  <=0.12 SENSITIVE Sensitive     CEFTAZIDIME <=1 SENSITIVE Sensitive     CEFTRIAXONE  <=0.25 SENSITIVE Sensitive     CIPROFLOXACIN  1 RESISTANT Resistant     GENTAMICIN RESISTANT Resistant     IMIPENEM 2 SENSITIVE Sensitive     TRIMETH/SULFA <=20 SENSITIVE Sensitive     AMPICILLIN /SULBACTAM >=32 RESISTANT Resistant     PIP/TAZO <=4 SENSITIVE Sensitive ug/mL    * RARE PROVIDENCIA STUARTII    Staphylococcus aureus - MIC*    CIPROFLOXACIN  >=8 RESISTANT Resistant     ERYTHROMYCIN >=8 RESISTANT Resistant     GENTAMICIN <=0.5 SENSITIVE Sensitive     OXACILLIN >=4 RESISTANT Resistant     TETRACYCLINE >=16 RESISTANT Resistant     VANCOMYCIN  1 SENSITIVE Sensitive     TRIMETH/SULFA >=320 RESISTANT Resistant     CLINDAMYCIN <=0.25 SENSITIVE Sensitive     RIFAMPIN <=0.5 SENSITIVE Sensitive     Inducible Clindamycin NEGATIVE Sensitive     LINEZOLID  2 SENSITIVE Sensitive     * FEW STAPHYLOCOCCUS AUREUS  Culture, blood (Routine x 2)     Status: None   Collection Time: 06/26/24 10:46 PM   Specimen: BLOOD  Result Value Ref Range Status   Specimen Description BLOOD BLOOD RIGHT ARM  Final   Special Requests   Final    BOTTLES DRAWN AEROBIC AND ANAEROBIC Blood Culture adequate volume   Culture   Final    NO GROWTH 5 DAYS Performed at [facility], [address] [place], [place] [address]  Report Status 07/01/2024 FINAL  Final  Aerobic/Anaerobic Culture w Gram Stain (surgical/deep wound)     Status: None (Preliminary result)   Collection Time: 06/29/24 10:09 AM   Specimen: PATH Bone biopsy; Tissue  Result Value Ref Range Status   Specimen Description   Final    TISSUE Performed at [facility], [address], [place], [place] [address]    Special Requests   Final    BONE BOX Performed at [facility], [address] [place], [place] [address]    Gram Stain   Final    NO WBC SEEN NO ORGANISMS SEEN Performed at [facility], [address], [place], [place] [address]    Culture   Final    FEW ESCHERICHIA COLI NO ANAEROBES ISOLATED; CULTURE IN PROGRESS FOR 5 DAYS    Report Status PENDING  Incomplete   Organism ID, Bacteria ESCHERICHIA COLI  Final      Susceptibility   Escherichia coli - MIC*    AMPICILLIN  <=2 SENSITIVE Sensitive     CEFEPIME  <=0.12 SENSITIVE Sensitive     CEFTAZIDIME <=1 SENSITIVE Sensitive     CEFTRIAXONE  <=0.25  SENSITIVE Sensitive     CIPROFLOXACIN  <=0.25 SENSITIVE Sensitive     GENTAMICIN <=1 SENSITIVE Sensitive     IMIPENEM <=0.25 SENSITIVE Sensitive     TRIMETH/SULFA <=20 SENSITIVE Sensitive     AMPICILLIN /SULBACTAM <=2 SENSITIVE Sensitive     PIP/TAZO <=4 SENSITIVE Sensitive ug/mL    * FEW ESCHERICHIA COLI  Culture, blood (Routine X 2) w Reflex to ID Panel     Status: None (Preliminary result)   Collection Time: 06/30/24  5:28 PM   Specimen: BLOOD  Result Value Ref Range Status   Specimen Description   Final    BLOOD BLOOD RIGHT ARM Performed at [facility], [address] [place], [place] [address]    Special Requests   Final    BOTTLES DRAWN AEROBIC AND ANAEROBIC Blood Culture adequate volume Performed at [facility], [address] [place], [place] [address]    Culture  Setup Time   Final    GRAM POSITIVE RODS AEROBIC BOTTLE ONLY CRITICAL RESULT CALLED TO, READ BACK BY AND VERIFIED WITHBETHA RANKIN ARGYLE PHARMD [id number] 07/03/24 HNM GRAM STAIN REVIEWED-AGREE WITH RESULT DRT Performed at [facility], [address] [place], [place] [address]    Culture GRAM POSITIVE RODS  Final   Report Status PENDING  Incomplete  Culture, blood (Routine X 2) w Reflex to ID Panel     Status: None (Preliminary result)   Collection Time: 06/30/24  5:33 PM   Specimen: BLOOD  Result Value Ref Range Status   Specimen Description BLOOD BLOOD LEFT ARM  Final   Special Requests   Final    BOTTLES DRAWN AEROBIC AND ANAEROBIC Blood Culture adequate volume   Culture   Final    NO GROWTH 3 DAYS Performed at [facility], [address] [place], [place] [address]    Report Status PENDING  Incomplete    Procedures and diagnostic studies:  No results found.              LOS: 8 days   Tucker Steedley  [facility]   Pager on [email or web address]. If 7PM-7AM, please contact night-coverage at [email or web address]     07/04/2024, 10:49 AM

## 2024-07-04 NOTE — Treatment Plan (Signed)
 Diagnosis: MRSA bacteremia and Diabetic foot infection rt Polymicrobial infection i Baseline Creatinine <1    No Known Allergies  OPAT Orders Discharge antibiotics: Daptomycin  800mg  IV every 24 hours Duration: 6 weeks total  And  Ertapenem 1 gram Iv every 24 hours 6 weeks total End Date: 08/06/24   [facility] Care Per Protocol:  Labs weekly while on IV antibiotics: _X_ CBC with differential _X_ CK _X_ CMP _X_ CRP _X_ ESR   X__ Please pull PIC at completion of IV antibiotics   Fax weekly lab results  promptly to [phone] & [phone]  Clinic Follow Up Appt: 07/24/24 at 10.30AM   Call [phone] with any critical value

## 2024-07-04 NOTE — Plan of Care (Signed)

## 2024-07-04 NOTE — Progress Notes (Signed)
 PODIATRY / FOOT AND ANKLE SURGERY PROGRESS NOTE  Requesting Physician: Dr. Lawence  Reason for consult: Foot wounds bilaterally   HPI: [name] is a 58 y.o. male who presents resting in bedside chair comfortably status post 5 days right transmetatarsal amputation revision and left foot wound debridements with skin graft placement.  Dressings appear to be clean, dry, and intact bilateral lower extremities.  Patient has remained nonweightbearing since the procedure.  He notes minimal to no pain today overall.  PMHx:  Past Medical History:  Diagnosis Date   Amputation of right great toe (HCC)    Cellulitis and abscess of foot    CKD stage 3a, GFR 45-59 ml/min (HCC)    Cocaine abuse (HCC)    + UDS on 05-12-24   DM (diabetes mellitus), type 2 (HCC)    ESBL (extended spectrum beta-lactamase) producing bacteria infection 04/18/2024   Hyperlipidemia    Hypertension    MRSA bacteremia    Normocytic anemia    Obesity    Tobacco abuse    Toe osteomyelitis, left (HCC)    Tuberculosis    tested positive,treated with medications.    Surgical Hx:  Past Surgical History:  Procedure Laterality Date   AMPUTATION TOE Right 03/26/2024   Procedure: AMPUTATION, TOE;  Surgeon: Harden Jerona GAILS, MD;  Location: [facility] OR;  Service: Orthopedics;  Laterality: Right;  RIGHT GREAT TOE AMPUTATION   BONE BIOPSY  04/18/2024   Procedure: BIOPSY, BONE (2ND METATARSAL);  Surgeon: Ashley Soulier, DPM;  Location: [facility] ORS;  Service: Orthopedics/Podiatry;;   FOOT SURGERY Left    I & D EXTREMITY Left 05/05/2022   Procedure: LEFT FOOT DEBRIDEMENT;  Surgeon: Harden Jerona GAILS, MD;  Location: [facility] OR;  Service: Orthopedics;  Laterality: Left;   INCISION AND DRAINAGE OF WOUND Right 06/05/2024   Procedure: DELAYED PRIMARY CLOSURE OF RIGHT FOOT WOUND, EXCISION OF DISTAL METATARSAL 2, 3, 4, and 5;  Surgeon: Ashley Soulier, DPM;  Location: [facility] ORS;  Service: Orthopedics/Podiatry;  Laterality: Right;   IRRIGATION AND DEBRIDEMENT FOOT  Right 04/18/2024   Procedure: IRRIGATION AND DEBRIDEMENT FOOT, PLACEMENT OF ANTIBIOTIC BEADS;  Surgeon: Ashley Soulier, DPM;  Location: [facility] ORS;  Service: Orthopedics/Podiatry;  Laterality: Right;   IRRIGATION AND DEBRIDEMENT FOOT Left 06/29/2024   Procedure: IRRIGATION AND DEBRIDEMENT FOOT;  Surgeon: Lennie Barter, DPM;  Location: [facility] ORS;  Service: Orthopedics/Podiatry;  Laterality: Left;   MYRINGOTOMY WITH TUBE PLACEMENT Bilateral 07/20/2022   Procedure: MYRINGOTOMY WITH TUBE PLACEMENT;  Surgeon: Edda Mt, MD;  Location: [facility];  Service: ENT;  Laterality: Bilateral;  Diabetic   TEE WITHOUT CARDIOVERSION N/A 07/02/2024   Procedure: ECHOCARDIOGRAM, TRANSESOPHAGEAL;  Surgeon: Perla Evalene PARAS, MD;  Location: [facility] ORS;  Service: Cardiovascular;  Laterality: N/A;   TONSILLECTOMY     TRANSMETATARSAL AMPUTATION Right 04/23/2024   Procedure: AMPUTATION, FOOT, TRANSMETATARSAL;  Surgeon: Ashley Soulier, DPM;  Location: [facility] ORS;  Service: Orthopedics/Podiatry;  Laterality: Right;   TRANSMETATARSAL AMPUTATION Right 06/29/2024   Procedure: AMPUTATION, FOOT, TRANSMETATARSAL;  Surgeon: Lennie Barter, DPM;  Location: [facility] ORS;  Service: Orthopedics/Podiatry;  Laterality: Right;  RIGHT TRANSMETATARSAL AMPUTATION REVISION    FHx:  Family History  Problem Relation Age of Onset   Colon cancer Neg Hx    Colon polyps Neg Hx    Crohn's disease Neg Hx    Esophageal cancer Neg Hx    Rectal cancer Neg Hx    Stomach cancer Neg Hx    Ulcerative colitis Neg Hx     Social History:  reports that he quit smoking about 21 months ago. His smoking use included cigarettes. He started smoking about 26 years ago. He has a 6.3 pack-year smoking history. He has never used smokeless tobacco. He reports that he does not drink alcohol and does not use drugs.  Allergies: No Known Allergies  Medications Prior to Admission  Medication Sig Dispense Refill   ascorbic acid  (VITAMIN C ) 500 MG tablet Take 1  tablet (500 mg total) by mouth daily. 30 tablet 2   bisacodyl  (DULCOLAX) 5 MG EC tablet Take 2 tablets (10 mg total) by mouth at bedtime as needed for moderate constipation. 30 tablet 0   chlorhexidine  (HIBICLENS ) 4 % external liquid Apply 15 mLs (1 Application total) topically as directed for 30 doses. Use as directed daily for 5 days every other week for 6 weeks. 946 mL 1   cyanocobalamin  1000 MCG tablet Take 1 tablet (1,000 mcg total) by mouth daily. 90 tablet 0   gabapentin  (NEURONTIN ) 300 MG capsule Take 1 capsule (300 mg total) by mouth at bedtime. 30 capsule 3   insulin  aspart (NOVOLOG ) 100 UNIT/ML FlexPen Inject 0-15 Units into the skin 3 (three) times daily before meals.  Correction coverage: Moderate (average weight, post-op) CBG 70 - 120: 0 units CBG 121 - 150: 2 units CBG 151 - 200: 3 units CBG 201 - 250: 5 units CBG 251 - 300: 8 units CBG 301 - 350: 11 units CBG 351 - 400: 15 units CBG > 400: call MD 15 mL 11   Insulin  NPH, Human,, Isophane, (HUMULIN  N KWIKPEN) 100 UNIT/ML Kiwkpen Inject 22 Units into the skin 2 (two) times daily. 30 mL 5   iron  polysaccharides (NIFEREX) 150 MG capsule Take 1 capsule (150 mg total) by mouth daily. 90 capsule 0   losartan  (COZAAR ) 25 MG tablet Take 1 tablet (25 mg total) by mouth daily. (Patient taking differently: Take 25 mg by mouth every morning.) 30 tablet 11   metFORMIN  (GLUCOPHAGE -XR) 500 MG 24 hr tablet Take 2 tablets (1,000 mg total) by mouth 2 (two) times daily with a meal. 120 tablet 1   mupirocin  ointment (BACTROBAN ) 2 % Use as directed into the nose  2 times daily for 5 days every other week for 6 weeks. 60 g 0   oxyCODONE -acetaminophen  (PERCOCET) 5-325 MG tablet Take 1-2 tablets by mouth every 6 (six) hours as needed for severe pain (pain score 7-10). Max 6 tabs per day 30 tablet 0   polyethylene glycol powder (GLYCOLAX /MIRALAX ) 17 GM/SCOOP powder Take 17 g by mouth 2 (two) times daily. Mix as directed. (Patient taking differently: Take  17 g by mouth 2 (two) times daily as needed for mild constipation. Mix as directed.) 238 g 0   Vitamin D , Ergocalciferol , (DRISDOL ) 1.25 MG (50000 UNIT) CAPS capsule Take 1 capsule (50,000 Units total) by mouth every 7 (seven) days. (Patient taking differently: Take 50,000 Units by mouth every Tuesday.) 12 capsule 0   Accu-Chek Softclix Lancets lancets Use to check blood sugar 3 times daily. 100 each 6   Blood Glucose Monitoring Suppl (ACCU-CHEK GUIDE) w/Device KIT Use to check blood sugar 3 times daily. 1 kit 0   Continuous Blood Gluc Transmit (DEXCOM G6 TRANSMITTER) MISC Use to check blood sugar three times daily. Change transmitter once every 909 days. E11.69 (Patient not taking: Reported on 06/05/2024) 1 each 1   doxycycline  (VIBRA -TABS) 100 MG tablet Take 1 tablet (100 mg total) by mouth 2 (two) times daily. (Patient not taking: Reported  on 06/05/2024) 28 tablet 0   glucose blood (ACCU-CHEK GUIDE TEST) test strip Use to check blood sugar 3 times daily. 100 each 6   Insulin  Pen Needle 32G X 4 MM MISC Use as directed to inject insulin  up to 4 times daily. 100 each 0   methocarbamol  (ROBAXIN ) 750 MG tablet Take 1 tablet (750 mg total) by mouth every 6 (six) hours as needed for muscle spasms. (Patient not taking: Reported on 06/27/2024) 45 tablet 0   nitroGLYCERIN  (NITRODUR - DOSED IN MG/24 HR) 0.2 mg/hr patch Place 1 patch (0.2 mg total) onto the skin daily. 30 patch 12    Physical Exam: General: Alert and oriented.  No apparent distress.  Vascular: DP/PT pulses palpable bilateral.  Neuro: Light touch sensation absent to bilateral lower extremities.  Derm: Right transmetatarsal amputation site appears to be well coapted with sutures/staples intact, no obvious dehiscence present today, reduction in erythema and edema present overall, no drainage.  Mild maceration present to the medial aspect of the incisional area, no obvious skin breakdown to this area.  Left foot plantar subsecond and fifth  metatarsals appears to have graft stapled in place, wounds appear to be stable at this time with graft intact.  No signs of infection present.  MSK: Right transmetatarsal amputation  Results for orders placed or performed during the hospital encounter of 06/26/24 (from the past 48 hours)  Glucose, capillary     Status: Abnormal   Collection Time: 07/02/24  5:00 PM  Result Value Ref Range   Glucose-Capillary 145 (H) 70 - 99 mg/dL    Comment: Glucose reference range applies only to samples taken after fasting for at least 8 hours.  Glucose, capillary     Status: Abnormal   Collection Time: 07/02/24  9:05 PM  Result Value Ref Range   Glucose-Capillary 169 (H) 70 - 99 mg/dL    Comment: Glucose reference range applies only to samples taken after fasting for at least 8 hours.  Glucose, capillary     Status: Abnormal   Collection Time: 07/03/24 12:04 AM  Result Value Ref Range   Glucose-Capillary 173 (H) 70 - 99 mg/dL    Comment: Glucose reference range applies only to samples taken after fasting for at least 8 hours.  Glucose, capillary     Status: Abnormal   Collection Time: 07/03/24  5:06 AM  Result Value Ref Range   Glucose-Capillary 176 (H) 70 - 99 mg/dL    Comment: Glucose reference range applies only to samples taken after fasting for at least 8 hours.  Glucose, capillary     Status: Abnormal   Collection Time: 07/03/24  8:29 AM  Result Value Ref Range   Glucose-Capillary 168 (H) 70 - 99 mg/dL    Comment: Glucose reference range applies only to samples taken after fasting for at least 8 hours.  Glucose, capillary     Status: Abnormal   Collection Time: 07/03/24 11:57 AM  Result Value Ref Range   Glucose-Capillary 136 (H) 70 - 99 mg/dL    Comment: Glucose reference range applies only to samples taken after fasting for at least 8 hours.  Glucose, capillary     Status: Abnormal   Collection Time: 07/03/24  4:51 PM  Result Value Ref Range   Glucose-Capillary 175 (H) 70 - 99 mg/dL     Comment: Glucose reference range applies only to samples taken after fasting for at least 8 hours.  Glucose, capillary     Status: Abnormal   Collection  Time: 07/03/24 10:20 PM  Result Value Ref Range   Glucose-Capillary 252 (H) 70 - 99 mg/dL    Comment: Glucose reference range applies only to samples taken after fasting for at least 8 hours.  Glucose, capillary     Status: Abnormal   Collection Time: 07/04/24  7:54 AM  Result Value Ref Range   Glucose-Capillary 174 (H) 70 - 99 mg/dL    Comment: Glucose reference range applies only to samples taken after fasting for at least 8 hours.  Glucose, capillary     Status: Abnormal   Collection Time: 07/04/24 11:38 AM  Result Value Ref Range   Glucose-Capillary 128 (H) 70 - 99 mg/dL    Comment: Glucose reference range applies only to samples taken after fasting for at least 8 hours.   No results found.   Blood pressure (!) 147/87, pulse 82, temperature 98.3 F (36.8 C), temperature source Oral, resp. rate 16, height 5' 10 (1.778 m), weight 102.1 kg, SpO2 100%.  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 Diabetes type 2 with neuropathy  Plan - Patient seen and examined - Postoperative x-ray imaging and MRI imaging of left foot reviewed and discussed with patient in detail.  Discussed with patient that left foot appears to have chronic changes present which may be related to the chronic dislocation of digits at the metatarsal phalangeal joint levels.  Both wounds present to the left foot and surgery did not have any bone exposed at this time.  No signs of infection were present to the left foot overall. -Incision to the right foot appears to be well coapted with sutures/staples intact with no obvious evidence of infection present  today.  Path negative.  Appreciate medicine/ID recommendations for antibiotic therapy. - Believe that the wounds to the left foot are purely pressure related to patient's increased plantar pressure from toe dislocations.  Discussed long-term if wounds do not heal to the left foot could consider transmetatarsal amputation on this side as well or consider pan metatarsal head resections with gastroc or Achilles lengthening.  Overall patient is a poor surgical candidate due to history of uncontrolled diabetes with neuropathy. - Redressed right foot today with Xeroform to the incision line and graft sites followed by 4 x 4 gauze, ABD, Kerlix, Ace wrap. Both dressings can be left clean and dry for the next week. - Continue nonweightbearing at all times to bilateral lower extremities.  Stressed importance.  May use heel for transfers only in postop shoes but should not be putting full pressure on the feet. - Discussed with patient would recommend skilled nursing rehab as if patient goes home and he is likely to walk on his feet and end up with postoperative complications at further.  This is already been proven as he has been through surgery before and put weight on this foot which is likely caused his wounds to not heal as well.  Patient has multifactorial reasons as to why the wound would not heal and this is not the only 1.  Still recommend SNF if possible.  Podiatry team to sign off at this time.  Would like to see again in 1 week.  Prentice Demareon, DPM 07/04/2024, 1:11 PM

## 2024-07-05 DIAGNOSIS — M86171 Other acute osteomyelitis, right ankle and foot: Secondary | ICD-10-CM | POA: Diagnosis not present

## 2024-07-05 LAB — GLUCOSE, CAPILLARY
Glucose-Capillary: 154 mg/dL — ABNORMAL HIGH (ref 70–99)
Glucose-Capillary: 142 mg/dL — ABNORMAL HIGH (ref 70–99)
Glucose-Capillary: 178 mg/dL — ABNORMAL HIGH (ref 70–99)
Glucose-Capillary: 184 mg/dL — ABNORMAL HIGH (ref 70–99)

## 2024-07-05 LAB — CULTURE, BLOOD (ROUTINE X 2)
Culture: NO GROWTH
Special Requests: ADEQUATE

## 2024-07-05 LAB — SEDIMENTATION RATE: Sed Rate: 97 mm/h — ABNORMAL HIGH (ref 0–20)

## 2024-07-05 LAB — CK: Total CK: 63 U/L (ref 49–397)

## 2024-07-05 LAB — C-REACTIVE PROTEIN: CRP: 4.6 mg/dL — ABNORMAL HIGH (ref <1.0)

## 2024-07-05 NOTE — Progress Notes (Addendum)
 Progress Note    [name]  FMW:[id number] DOB: 1966/10/20  DOA: 06/26/2024 PCP: [name], MD      Brief Narrative:    Medical records reviewed and are as summarized below:  [name] is a 58 y.o. male with medical history significant for hypertension, dyslipidemia, tobacco abuse, stage IIIa chronic kidney disease, cocaine abuse, and type 2 diabetes mellitus, who presented to the emergency room with acute onset of right foot pain with drainage from a recent toe amputation wound of clear fluid.  No fever or chills.    On presentation stable vital, labs with hyperglycemia at 345, creatinine 1.42, albumin 2.8, leukocytosis at 12.3 and hemoglobin of 8.7.  Wound and blood cultures were obtained.   MRI of the foot : 1. Status post interval second through fifth ray amputation at the level of the mid metatarsal shafts. Associated soft tissue wound extending along the dorsal stump into the deep soft tissues overlying the second through fifth digit metatarsal transsection margins which demonstrate T2/STIR hyperintense marrow edema with corresponding T1 hypointensity. These findings are concerning for osteomyelitis of the remnant second through fifth metatarsals, although, recent postsurgical change can demonstrate a similar appearance. Cellulitis cannot be excluded. No drainable abscess. 2. Diffuse nonspecific increased T2 signal of the intrinsic musculature of the foot, myositis cannot be excluded. 3. Tenosynovitis of the posterior tibialis, flexor digitorum, and flexor hallucis longus tendons.   He was admitted to the hospital for right foot osteomyelitis.  Blood culture showed MRSA consistent with MRSA bacteremia.   Assessment/Plan:   Principal Problem:   Acute osteomyelitis of right foot (HCC) Active Problems:   MRSA bacteremia   Uncontrolled type 2 diabetes mellitus with hyperglycemia, with long-term current use of insulin  (HCC)   Essential hypertension    Dyslipidemia   Wound infection   Endocarditis    Body mass index is 32.28 kg/m.  (Class I obesity)   Acute right foot infection/cellulitis, MRSA bacteremia: S/p right revision transmetatarsal amputation, left foot subcutaneous wound debridement with biopsy, left foot wound graft placement on 06/29/2024.  Surgical wound culture showed E. coli that is pansensitive. No acute inflammation or evidence of osteomyelitis on surgical pathology. S/p TEE on 07/02/2024 which was negative for vegetations.  Preserved EF. Repeat blood culture from 06/30/2024-1 out of 4 bottles growing gram-positive rods but this is likely a contaminant. Continue IV daptomycin .  IV meropenem  has been changed to IV ertapenem .  Plan to continue IV antibiotics through 08/06/2024 per ID's recommendation. Patient will need PICC line for long-term antibiotics.  Will hold off on PICC line until patient is close to being discharged from the hospital. Patient will need SNF at discharge.  Follow-up with TOC for SNF placement.     Type II DM with hyperglycemia: Continue Semglee  and NovoLog .  Use NovoLog  as needed for hyperglycemia.   Comorbidities include hypertension, dyslipidemia   Diet Order             Diet Carb Modified Fluid consistency: Thin; Room service appropriate? Yes  Diet effective now                            Consultants: ID specialist Cardiologist  Procedures: TEE on 07/02/2024    Medications:    ascorbic acid   500 mg Oral Daily   cyanocobalamin   1,000 mcg Oral Daily   enoxaparin  (LOVENOX ) injection  0.5 mg/kg Subcutaneous Q24H   folic acid   1 mg Oral Daily  gabapentin   300 mg Oral BID   insulin  aspart  0-15 Units Subcutaneous TID WC   insulin  aspart  0-5 Units Subcutaneous QHS   insulin  aspart  5 Units Subcutaneous TID WC   insulin  glargine-yfgn  25 Units Subcutaneous BID   iron  polysaccharides  150 mg Oral Daily   losartan   25 mg Oral Daily   mupirocin  ointment  1 Application  Nasal BID   nitroGLYCERIN   0.2 mg Transdermal Daily   Vitamin D  (Ergocalciferol )  50,000 Units Oral Q Tue   Continuous Infusions:  DAPTOmycin  800 mg (07/05/24 1257)   ertapenem  1 g (07/05/24 0558)     Anti-infectives (From admission, onward)    Start     Dose/Rate Route Frequency Ordered Stop   07/05/24 0600  ertapenem  (INVANZ ) 1 g in sodium chloride  0.9 % 100 mL IVPB        1 g 200 mL/hr over 30 Minutes Intravenous Every 24 hours 07/04/24 0934     06/28/24 1145  DAPTOmycin  (CUBICIN ) 800 mg in sodium chloride  0.9 % IVPB        8 mg/kg  102.1 kg 132 mL/hr over 30 Minutes Intravenous Daily 06/28/24 1051     06/27/24 2200  linezolid  (ZYVOX ) tablet 600 mg  Status:  Discontinued        600 mg Oral Every 12 hours 06/27/24 1003 06/28/24 1048   06/27/24 1300  meropenem  (MERREM ) 1 g in sodium chloride  0.9 % 100 mL IVPB        1 g 200 mL/hr over 30 Minutes Intravenous Every 8 hours 06/27/24 0958 07/04/24 2248   06/27/24 0000  vancomycin  (VANCOCIN ) IVPB 1000 mg/200 mL premix       Placed in Followed by Linked Group   1,000 mg 200 mL/hr over 60 Minutes Intravenous  Once 06/26/24 2127 06/27/24 0111   06/26/24 2215  piperacillin -tazobactam (ZOSYN ) IVPB 3.375 g  Status:  Discontinued       Placed in And Linked Group   3.375 g 100 mL/hr over 30 Minutes Intravenous  Once 06/26/24 2118 06/26/24 2127   06/26/24 2215  vancomycin  (VANCOREADY) IVPB 1250 mg/250 mL       Placed in Followed by Linked Group   1,250 mg 166.7 mL/hr over 90 Minutes Intravenous  Once 06/26/24 2127 06/27/24 0000   06/26/24 2215  piperacillin -tazobactam (ZOSYN ) IVPB 3.375 g  Status:  Discontinued        3.375 g 12.5 mL/hr over 240 Minutes Intravenous Every 8 hours 06/26/24 2127 06/27/24 0958   06/26/24 2141  vancomycin  variable dose per unstable renal function (pharmacist dosing)  Status:  Discontinued         Does not apply See admin instructions 06/26/24 2141 06/27/24 1004   06/26/24 1930  ciprofloxacin  (CIPRO )  IVPB 400 mg        400 mg 200 mL/hr over 60 Minutes Intravenous  Once 06/26/24 1923 06/26/24 2047              Family Communication/Anticipated D/C date and plan/Code Status   DVT prophylaxis:      Code Status: Full Code  Family Communication: None Disposition Plan: Plans to go to SNF   Status is: Inpatient Remains inpatient appropriate because: MRSA bacteremia, right foot osteomyelitis       Subjective:   Events noted.  No acute issues reported.  He feels okay.  Objective:    Vitals:   07/04/24 1510 07/04/24 2010 07/05/24 0340 07/05/24 0725  BP: 134/62 124/78 132/71 (!) 149/93  Pulse: 80 88 88 81  Resp: 16 18 18 17   Temp: 97.9 F (36.6 C) 98.1 F (36.7 C) 97.8 F (36.6 C) 97.6 F (36.4 C)  TempSrc: Oral Oral Oral Oral  SpO2: 99% 100% 100% 100%  Weight:      Height:       No data found.   Intake/Output Summary (Last 24 hours) at 07/05/2024 1415 Last data filed at 07/05/2024 0900 Gross per 24 hour  Intake 480 ml  Output --  Net 480 ml   Filed Weights   06/26/24 1335  Weight: 102.1 kg    Exam:  GEN: NAD SKIN: Warm and dry EYES: No pallor or icterus ENT: MMM CV: RRR PULM: CTA B ABD: soft, ND, NT, +BS CNS: AAO x 3, non focal EXT: Dressing on bilateral feet are clean, dry and intact      Data Reviewed:   I have personally reviewed following labs and imaging studies:  Labs: Labs show the following:   Basic Metabolic Panel: Recent Labs  Lab 06/30/24 0422 07/02/24 1248  NA 139 138  K 4.7 4.2  CL 103 104  CO2 26 28  GLUCOSE 201* 74  BUN 23* 19  CREATININE 1.29* 1.14  CALCIUM  8.7* 8.8*   GFR Estimated Creatinine Clearance: 85.5 mL/min (by C-G formula based on SCr of 1.14 mg/dL). Liver Function Tests: No results for input(s): AST, ALT, ALKPHOS, BILITOT, PROT, ALBUMIN in the last 168 hours.  No results for input(s): LIPASE, AMYLASE in the last 168 hours. No results for input(s): AMMONIA in the last 168  hours. Coagulation profile No results for input(s): INR, PROTIME in the last 168 hours.   CBC: Recent Labs  Lab 06/30/24 0422  WBC 13.4*  HGB 8.1*  HCT 25.7*  MCV 79.1*  PLT 472*   Cardiac Enzymes: Recent Labs  Lab 07/05/24 0401  CKTOTAL 63   BNP (last 3 results) No results for input(s): PROBNP in the last 8760 hours. CBG: Recent Labs  Lab 07/04/24 1138 07/04/24 1711 07/04/24 2017 07/05/24 0728 07/05/24 1134  GLUCAP 128* 168* 165* 154* 142*   D-Dimer: No results for input(s): DDIMER in the last 72 hours. Hgb A1c: No results for input(s): HGBA1C in the last 72 hours. Lipid Profile: No results for input(s): CHOL, HDL, LDLCALC, TRIG, CHOLHDL, LDLDIRECT in the last 72 hours. Thyroid  function studies: No results for input(s): TSH, T4TOTAL, T3FREE, THYROIDAB in the last 72 hours.  Invalid input(s): FREET3 Anemia work up: No results for input(s): VITAMINB12, FOLATE, FERRITIN, TIBC, IRON , RETICCTPCT in the last 72 hours. Sepsis Labs: Recent Labs  Lab 06/30/24 0422  WBC 13.4*    Microbiology Recent Results (from the past 240 hours)  Culture, blood (Routine x 2)     Status: Abnormal (Preliminary result)   Collection Time: 06/26/24  1:37 PM   Specimen: BLOOD  Result Value Ref Range Status   Specimen Description   Final    BLOOD RIGHT ANTECUBITAL Performed at [facility], [address], [place], [place] [address]    Special Requests   Final    BOTTLES DRAWN AEROBIC AND ANAEROBIC Blood Culture adequate volume Performed at [facility], [address] [place], [place] [address]    Culture  Setup Time   Final    GRAM POSITIVE COCCI IN BOTH AEROBIC AND ANAEROBIC BOTTLES CRITICAL RESULT CALLED TO, READ BACK BY AND VERIFIED WITH: ERIN [facility] AT 1615 06/27/24.PMF Performed at [facility], [address], [place], [place] [address]  Culture (A)  Final    METHICILLIN RESISTANT  STAPHYLOCOCCUS AUREUS Sent to [facility] for further susceptibility testing. Performed at [facility], [address], [place], [place] [address]    Report Status PENDING  Incomplete   Organism ID, Bacteria METHICILLIN RESISTANT STAPHYLOCOCCUS AUREUS  Final      Susceptibility   Methicillin resistant staphylococcus aureus - MIC*    CIPROFLOXACIN  >=8 RESISTANT Resistant     ERYTHROMYCIN >=8 RESISTANT Resistant     GENTAMICIN <=0.5 SENSITIVE Sensitive     OXACILLIN >=4 RESISTANT Resistant     TETRACYCLINE >=16 RESISTANT Resistant     VANCOMYCIN  1 SENSITIVE Sensitive     TRIMETH/SULFA >=320 RESISTANT Resistant     CLINDAMYCIN <=0.25 SENSITIVE Sensitive     RIFAMPIN <=0.5 SENSITIVE Sensitive     Inducible Clindamycin NEGATIVE Sensitive     LINEZOLID  2 SENSITIVE Sensitive     * METHICILLIN RESISTANT STAPHYLOCOCCUS AUREUS  Blood Culture ID Panel (Reflexed)     Status: Abnormal   Collection Time: 06/26/24  1:37 PM  Result Value Ref Range Status   Enterococcus faecalis NOT DETECTED NOT DETECTED Final   Enterococcus Faecium NOT DETECTED NOT DETECTED Final   Listeria monocytogenes NOT DETECTED NOT DETECTED Final   Staphylococcus species DETECTED (A) NOT DETECTED Final    Comment: CRITICAL RESULT CALLED TO, READ BACK BY AND VERIFIED WITH: ERIN MEEGIN AT 1615 06/27/24.PMF    Staphylococcus aureus (BCID) DETECTED (A) NOT DETECTED Final    Comment: Methicillin (oxacillin)-resistant Staphylococcus aureus (MRSA). MRSA is predictably resistant to beta-lactam antibiotics (except ceftaroline). Preferred therapy is vancomycin  unless clinically contraindicated. Patient requires contact precautions if  hospitalized. CRITICAL RESULT CALLED TO, READ BACK BY AND VERIFIED WITH: ERIN MEEGIN AT 1615 06/27/24.PMF    Staphylococcus epidermidis NOT DETECTED NOT DETECTED Final   Staphylococcus lugdunensis NOT DETECTED NOT DETECTED Final   Streptococcus species NOT DETECTED NOT DETECTED Final   Streptococcus  agalactiae NOT DETECTED NOT DETECTED Final   Streptococcus pneumoniae NOT DETECTED NOT DETECTED Final   Streptococcus pyogenes NOT DETECTED NOT DETECTED Final   A.calcoaceticus-baumannii NOT DETECTED NOT DETECTED Final   Bacteroides fragilis NOT DETECTED NOT DETECTED Final   Enterobacterales NOT DETECTED NOT DETECTED Final   Enterobacter cloacae complex NOT DETECTED NOT DETECTED Final   Escherichia coli NOT DETECTED NOT DETECTED Final   Klebsiella aerogenes NOT DETECTED NOT DETECTED Final   Klebsiella oxytoca NOT DETECTED NOT DETECTED Final   Klebsiella pneumoniae NOT DETECTED NOT DETECTED Final   Proteus species NOT DETECTED NOT DETECTED Final   Salmonella species NOT DETECTED NOT DETECTED Final   Serratia marcescens NOT DETECTED NOT DETECTED Final   Haemophilus influenzae NOT DETECTED NOT DETECTED Final   Neisseria meningitidis NOT DETECTED NOT DETECTED Final   Pseudomonas aeruginosa NOT DETECTED NOT DETECTED Final   Stenotrophomonas maltophilia NOT DETECTED NOT DETECTED Final   Candida albicans NOT DETECTED NOT DETECTED Final   Candida auris NOT DETECTED NOT DETECTED Final   Candida glabrata NOT DETECTED NOT DETECTED Final   Candida krusei NOT DETECTED NOT DETECTED Final   Candida parapsilosis NOT DETECTED NOT DETECTED Final   Candida tropicalis NOT DETECTED NOT DETECTED Final   Cryptococcus neoformans/gattii NOT DETECTED NOT DETECTED Final   Meth resistant mecA/C and MREJ DETECTED (A) NOT DETECTED Final    Comment: CRITICAL RESULT CALLED TO, READ BACK BY AND VERIFIED WITH: ERIN MEEGIN AT 1615 06/27/24.PMF Performed at [facility], [address], [place], [place] [address]   Aerobic/Anaerobic Culture  w Gram Stain (surgical/deep wound)     Status: None   Collection Time: 06/26/24  4:03 PM   Specimen: Foot  Result Value Ref Range Status   Specimen Description   Final    FOOT Performed at [facility], [address] [place], [place] [address]    Special  Requests   Final    RIGHT FOOT Performed at [facility], [address] [place], [place] [address]    Gram Stain   Final    RARE WBC PRESENT, PREDOMINANTLY PMN NO ORGANISMS SEEN Performed at [facility], [address] [place], [place] [address]    Culture   Final    FEW STAPHYLOCOCCUS AUREUS MODERATE DIPHTHEROIDS(CORYNEBACTERIUM SPECIES) Standardized susceptibility testing for this organism is not available. RARE KLEBSIELLA PNEUMONIAE Confirmed Extended Spectrum Beta-Lactamase Producer (ESBL).  In bloodstream infections from ESBL organisms, carbapenems are preferred over piperacillin /tazobactam. They are shown to have a lower risk of mortality. RARE PROVIDENCIA STUARTII    Report Status 06/29/2024 FINAL  Final   Organism ID, Bacteria STAPHYLOCOCCUS AUREUS  Final   Organism ID, Bacteria KLEBSIELLA PNEUMONIAE  Final   Organism ID, Bacteria PROVIDENCIA STUARTII  Final      Susceptibility   Klebsiella pneumoniae - MIC*    AMPICILLIN  >=32 RESISTANT Resistant     CEFEPIME  >=32 RESISTANT Resistant     CEFTAZIDIME RESISTANT Resistant     CEFTRIAXONE  >=64 RESISTANT Resistant     CIPROFLOXACIN  >=4 RESISTANT Resistant     GENTAMICIN <=1 SENSITIVE Sensitive     IMIPENEM <=0.25 SENSITIVE Sensitive     TRIMETH/SULFA >=320 RESISTANT Resistant     AMPICILLIN /SULBACTAM >=32 RESISTANT Resistant     PIP/TAZO 16 SENSITIVE Sensitive ug/mL    * RARE KLEBSIELLA PNEUMONIAE   Providencia stuartii - MIC*    AMPICILLIN  >=32 RESISTANT Resistant     CEFEPIME  <=0.12 SENSITIVE Sensitive     CEFTAZIDIME <=1 SENSITIVE Sensitive     CEFTRIAXONE  <=0.25 SENSITIVE Sensitive     CIPROFLOXACIN  1 RESISTANT Resistant     GENTAMICIN RESISTANT Resistant     IMIPENEM 2 SENSITIVE Sensitive     TRIMETH/SULFA <=20 SENSITIVE Sensitive     AMPICILLIN /SULBACTAM >=32 RESISTANT Resistant     PIP/TAZO <=4 SENSITIVE Sensitive ug/mL    * RARE PROVIDENCIA STUARTII   Staphylococcus aureus - MIC*     CIPROFLOXACIN  >=8 RESISTANT Resistant     ERYTHROMYCIN >=8 RESISTANT Resistant     GENTAMICIN <=0.5 SENSITIVE Sensitive     OXACILLIN >=4 RESISTANT Resistant     TETRACYCLINE >=16 RESISTANT Resistant     VANCOMYCIN  1 SENSITIVE Sensitive     TRIMETH/SULFA >=320 RESISTANT Resistant     CLINDAMYCIN <=0.25 SENSITIVE Sensitive     RIFAMPIN <=0.5 SENSITIVE Sensitive     Inducible Clindamycin NEGATIVE Sensitive     LINEZOLID  2 SENSITIVE Sensitive     * FEW STAPHYLOCOCCUS AUREUS  Culture, blood (Routine x 2)     Status: None   Collection Time: 06/26/24 10:46 PM   Specimen: BLOOD  Result Value Ref Range Status   Specimen Description BLOOD BLOOD RIGHT ARM  Final   Special Requests   Final    BOTTLES DRAWN AEROBIC AND ANAEROBIC Blood Culture adequate volume   Culture   Final    NO GROWTH 5 DAYS Performed at [facility], [address], [place], [place] [address]    Report Status 07/01/2024 FINAL  Final  Aerobic/Anaerobic Culture w Gram Stain (surgical/deep wound)  Status: None   Collection Time: 06/29/24 10:09 AM   Specimen: PATH Bone biopsy; Tissue  Result Value Ref Range Status   Specimen Description   Final    TISSUE Performed at [facility], [address] [place], [place] [address]    Special Requests   Final    BONE BOX Performed at [facility], [address] [place], [place] [address]    Gram Stain NO WBC SEEN NO ORGANISMS SEEN   Final   Culture   Final    FEW ESCHERICHIA COLI NO ANAEROBES ISOLATED Performed at [facility], [address] [place], [place] [address]    Report Status 07/04/2024 FINAL  Final   Organism ID, Bacteria ESCHERICHIA COLI  Final      Susceptibility   Escherichia coli - MIC*    AMPICILLIN  <=2 SENSITIVE Sensitive     CEFEPIME  <=0.12 SENSITIVE Sensitive     CEFTAZIDIME <=1 SENSITIVE Sensitive     CEFTRIAXONE  <=0.25 SENSITIVE Sensitive     CIPROFLOXACIN  <=0.25 SENSITIVE Sensitive     GENTAMICIN <=1  SENSITIVE Sensitive     IMIPENEM <=0.25 SENSITIVE Sensitive     TRIMETH/SULFA <=20 SENSITIVE Sensitive     AMPICILLIN /SULBACTAM <=2 SENSITIVE Sensitive     PIP/TAZO <=4 SENSITIVE Sensitive ug/mL    * FEW ESCHERICHIA COLI  Culture, blood (Routine X 2) w Reflex to ID Panel     Status: None (Preliminary result)   Collection Time: 06/30/24  5:28 PM   Specimen: BLOOD  Result Value Ref Range Status   Specimen Description   Final    BLOOD BLOOD RIGHT ARM Performed at [facility], [address] [place], [place] [address]    Special Requests   Final    BOTTLES DRAWN AEROBIC AND ANAEROBIC Blood Culture adequate volume Performed at [facility], [address] [place], [place] [address]    Culture  Setup Time   Final    GRAM POSITIVE RODS AEROBIC BOTTLE ONLY CRITICAL RESULT CALLED TO, READ BACK BY AND VERIFIED WITHBETHA RANKIN ARGYLE PHARMD [id number] 07/03/24 HNM GRAM STAIN REVIEWED-AGREE WITH RESULT DRT    Culture   Final    GRAM POSITIVE RODS IDENTIFICATION TO FOLLOW Performed at [facility], [address], [place], [place] [address]    Report Status PENDING  Incomplete  Culture, blood (Routine X 2) w Reflex to ID Panel     Status: None   Collection Time: 06/30/24  5:33 PM   Specimen: BLOOD  Result Value Ref Range Status   Specimen Description BLOOD BLOOD LEFT ARM  Final   Special Requests   Final    BOTTLES DRAWN AEROBIC AND ANAEROBIC Blood Culture adequate volume   Culture   Final    NO GROWTH 5 DAYS Performed at [facility], [address], [place], [place] [address]    Report Status 07/05/2024 FINAL  Final    Procedures and diagnostic studies:  No results found.              LOS: 9 days   Malaysha Arlen  [employer] loss adjuster on [email or web address]. If 7PM-7AM, please contact night-coverage at [email or web address]     07/05/2024, 2:15 PM

## 2024-07-05 NOTE — Plan of Care (Signed)
  Problem: Education: Goal: Knowledge of General Education information will improve Description: Including pain rating scale, medication(s)/side effects and non-pharmacologic comfort measures Outcome: Progressing   Problem: Health Behavior/Discharge Planning: Goal: Ability to manage health-related needs will improve Outcome: Progressing   Problem: Pain Managment: Goal: General experience of comfort will improve and/or be controlled Outcome: Progressing   Problem: Skin Integrity: Goal: Skin integrity will improve Outcome: Progressing

## 2024-07-05 NOTE — Progress Notes (Signed)
 Physical Therapy Treatment Patient Details Name: [name] MRN: [id number] DOB: 1966-05-25 Today's Date: 07/05/2024   History of Present Illness Pt is a 58 y.o. who presented with redness, painn swelling, and discharge from the previous R TMA site on 06/26/24 and is now s/p revision TMA and debridement of chronic L foot ulcers with skin graft on 7/27. Per surgical note Pt is to be NWB for the most part at all times, but may apply a little bit of weight to his heels for transfers, but not to the balls of the feet. PMH includes: DM, DFI, MRSA bacteremia and MRSA foot infection, R TMA, left foot chronic wound.    PT Comments  Pt was sitting in recliner upon arrival. RN x2 in room giving meds. PT is A and O x 4 and agreeable to session. He demonstrated safe abilities to stand form recliner and stand pivot to w/c. Pt did I'ly place BLE post op shoes. He knew his wt bearing restrictions but was cued during standing for improved technique to adhere to proper wt bearing. Pt was educated on management of w/c parts. He self propelled w/c > 500 ft without assistance. At conclusion of session, pt requested to stay in w/c for easy of getting to/from [facility]. RN staff is aware. Acute PT will continue to follow per current POC. Dc recs remain appropriate due to pt's lack of assistance at home and BLE NWB status   If plan is discharge home, recommend the following: Assistance with cooking/housework;Assist for transportation;A little help with walking and/or transfers;A little help with bathing/dressing/bathroom;Help with stairs or ramp for entrance     Equipment Recommendations  Other (comment)       Precautions / Restrictions Precautions Precautions: Fall Recall of Precautions/Restrictions: Intact Restrictions Weight Bearing Restrictions Per Provider Order: Yes RLE Weight Bearing Per Provider Order: Non weight bearing LLE Weight Bearing Per Provider Order: Non weight bearing Other Position/Activity Restrictions:  per podiatrist pt is allowed heel WB for transfers only in post op shoes, no walking or WB through balls of the feet     Mobility  Bed Mobility Overal bed mobility: Independent  General bed mobility comments: pt was in recliner pre/post session. Per staff, he can do it without help.    Transfers Overall transfer level: Needs assistance Equipment used: Rolling walker (2 wheels) Transfers: Bed to chair/wheelchair/BSC Stand pivot transfers: Supervision  General transfer comment: Pt I'ly put BLE post op shoes prior to standpivot to w/c form recliner with use of RW. no physical assistance however vcs for technique and maintaining  proper wt bearing    Ambulation/Gait  General Gait Details: unable due to NWB. Did self propel w/c > 571ft   Wheelchair Mobility Wheelchair Mobility Wheelchair mobility: Yes Wheelchair propulsion: Both upper extremities Wheelchair parts: Needs assistance Distance: >500 Wheelchair Assistance Details (indicate cue type and reason): Author educated pt on w/.c parts and how to manage. he struggles with remove and replacement of BLE leg rest however he is easily and safely able to self propel manual w/c without assistance        Communication Communication Communication: No apparent difficulties  Cognition Arousal: Alert Behavior During Therapy: WFL for tasks assessed/performed   PT - Cognitive impairments: No apparent impairments    PT - Cognition Comments: pleasant and agreeable to session Following commands: Intact      Cueing Cueing Techniques: Verbal cues     General Comments General comments (skin integrity, edema, etc.): Reviewed importance of ther ex due to  inability to stand and ambulate. He states understanding      Pertinent Vitals/Pain Pain Assessment Pain Assessment: No/denies pain     PT Goals (current goals can now be found in the care plan section) Acute Rehab PT Goals Patient Stated Goal: To go to rehab and recover Progress  towards PT goals: Progressing toward goals    Frequency    Min 1X/week       AM-PAC PT 6 Clicks Mobility   Outcome Measure  Help needed turning from your back to your side while in a flat bed without using bedrails?: None Help needed moving from lying on your back to sitting on the side of a flat bed without using bedrails?: None Help needed moving to and from a bed to a chair (including a wheelchair)?: A Little Help needed standing up from a chair using your arms (e.g., wheelchair or bedside chair)?: A Little Help needed to walk in hospital room?: A Little Help needed climbing 3-5 steps with a railing? : Total 6 Click Score: 18    End of Session   Activity Tolerance: Patient tolerated treatment well Patient left: Other (comment) (In W/C post session with RN staff aware) Nurse Communication: Mobility status;Weight bearing status PT Visit Diagnosis: Pain;Other abnormalities of gait and mobility (R26.89) Pain - part of body: Ankle and joints of foot     Time: 9083-9059 PT Time Calculation (min) (ACUTE ONLY): 24 min  Charges:    $Therapeutic Activity: 8-22 mins $Wheel Chair Management: 8-22 mins PT General Charges $$ ACUTE PT VISIT: 1 Visit                     Rankin Essex PTA 07/05/24, 12:09 PM

## 2024-07-06 DIAGNOSIS — M86171 Other acute osteomyelitis, right ankle and foot: Secondary | ICD-10-CM | POA: Diagnosis not present

## 2024-07-06 LAB — CULTURE, BLOOD (ROUTINE X 2): Special Requests: ADEQUATE

## 2024-07-06 LAB — GLUCOSE, CAPILLARY
Glucose-Capillary: 213 mg/dL — ABNORMAL HIGH (ref 70–99)
Glucose-Capillary: 103 mg/dL — ABNORMAL HIGH (ref 70–99)
Glucose-Capillary: 139 mg/dL — ABNORMAL HIGH (ref 70–99)
Glucose-Capillary: 149 mg/dL — ABNORMAL HIGH (ref 70–99)

## 2024-07-06 NOTE — Plan of Care (Signed)
  Problem: Activity: Goal: Risk for activity intolerance will decrease Outcome: Progressing   Problem: Elimination: Goal: Will not experience complications related to bowel motility Outcome: Progressing   Problem: Safety: Goal: Ability to remain free from injury will improve Outcome: Progressing   Problem: Clinical Measurements: Goal: Ability to avoid or minimize complications of infection will improve Outcome: Progressing   Problem: Skin Integrity: Goal: Skin integrity will improve Outcome: Progressing

## 2024-07-06 NOTE — Progress Notes (Signed)
 Progress Note    [name]  FMW:[id number] DOB: Feb 10, 1966  DOA: 06/26/2024 PCP: [name], MD      Brief Narrative:    Medical records reviewed and are as summarized below:  [name] is a 58 y.o. male with medical history significant for hypertension, dyslipidemia, tobacco abuse, stage IIIa chronic kidney disease, cocaine abuse, and type 2 diabetes mellitus, who presented to the emergency room with acute onset of right foot pain with drainage from a recent toe amputation wound of clear fluid.  No fever or chills.    On presentation stable vital, labs with hyperglycemia at 345, creatinine 1.42, albumin 2.8, leukocytosis at 12.3 and hemoglobin of 8.7.  Wound and blood cultures were obtained.   MRI of the foot : 1. Status post interval second through fifth ray amputation at the level of the mid metatarsal shafts. Associated soft tissue wound extending along the dorsal stump into the deep soft tissues overlying the second through fifth digit metatarsal transsection margins which demonstrate T2/STIR hyperintense marrow edema with corresponding T1 hypointensity. These findings are concerning for osteomyelitis of the remnant second through fifth metatarsals, although, recent postsurgical change can demonstrate a similar appearance. Cellulitis cannot be excluded. No drainable abscess. 2. Diffuse nonspecific increased T2 signal of the intrinsic musculature of the foot, myositis cannot be excluded. 3. Tenosynovitis of the posterior tibialis, flexor digitorum, and flexor hallucis longus tendons.   He was admitted to the hospital for right foot osteomyelitis.  Blood culture showed MRSA consistent with MRSA bacteremia.   Assessment/Plan:   Principal Problem:   Acute osteomyelitis of right foot (HCC) Active Problems:   MRSA bacteremia   Uncontrolled type 2 diabetes mellitus with hyperglycemia, with long-term current use of insulin  (HCC)   Essential hypertension    Dyslipidemia   Wound infection   Endocarditis    Body mass index is 32.28 kg/m.  (Class I obesity)   Acute right foot infection/cellulitis, MRSA bacteremia: S/p right revision transmetatarsal amputation, left foot subcutaneous wound debridement with biopsy, left foot wound graft placement on 06/29/2024.  Surgical wound culture showed E. coli that is pansensitive. No acute inflammation or evidence of osteomyelitis on surgical pathology. S/p TEE on 07/02/2024 which was negative for vegetations.  Preserved EF. Repeat blood culture from 06/30/2024-1 out of 4 bottles grew gram-positive rods but this is likely a contaminant.  Species could not be identified by her local lab.  No need to send specimen to [facility] for species identification per  ID, [name].  Discussed via secure chat. Continue IV daptomycin  and IV ertapenem  through 08/06/2024.   Patient will need PICC line for long-term antibiotics.  Will hold off on PICC line until patient is close to being discharged from the hospital. Patient will need SNF at discharge.  Follow-up with TOC for SNF placement.     Type II DM with hyperglycemia: Continue Semglee  25 units daily, NovoLog  5 units 3 times daily with meals and NovoLog  as needed for hyperglycemia.     Comorbidities include hypertension, dyslipidemia   Diet Order             Diet Carb Modified Fluid consistency: Thin; Room service appropriate? Yes  Diet effective now                            Consultants: ID specialist Cardiologist  Procedures: TEE on 07/02/2024    Medications:    ascorbic acid   500 mg Oral  Daily   cyanocobalamin   1,000 mcg Oral Daily   enoxaparin  (LOVENOX ) injection  0.5 mg/kg Subcutaneous Q24H   folic acid   1 mg Oral Daily   gabapentin   300 mg Oral BID   insulin  aspart  0-15 Units Subcutaneous TID WC   insulin  aspart  0-5 Units Subcutaneous QHS   insulin  aspart  5 Units Subcutaneous TID WC   insulin  glargine-yfgn  25 Units  Subcutaneous BID   iron  polysaccharides  150 mg Oral Daily   losartan   25 mg Oral Daily   mupirocin  ointment  1 Application Nasal BID   Vitamin D  (Ergocalciferol )  50,000 Units Oral Q Tue   Continuous Infusions:  DAPTOmycin  800 mg (07/05/24 1257)   ertapenem  1 g (07/06/24 0700)     Anti-infectives (From admission, onward)    Start     Dose/Rate Route Frequency Ordered Stop   07/05/24 0600  ertapenem  (INVANZ ) 1 g in sodium chloride  0.9 % 100 mL IVPB        1 g 200 mL/hr over 30 Minutes Intravenous Every 24 hours 07/04/24 0934     06/28/24 1145  DAPTOmycin  (CUBICIN ) 800 mg in sodium chloride  0.9 % IVPB        8 mg/kg  102.1 kg 132 mL/hr over 30 Minutes Intravenous Daily 06/28/24 1051     06/27/24 2200  linezolid  (ZYVOX ) tablet 600 mg  Status:  Discontinued        600 mg Oral Every 12 hours 06/27/24 1003 06/28/24 1048   06/27/24 1300  meropenem  (MERREM ) 1 g in sodium chloride  0.9 % 100 mL IVPB        1 g 200 mL/hr over 30 Minutes Intravenous Every 8 hours 06/27/24 0958 07/04/24 2248   06/27/24 0000  vancomycin  (VANCOCIN ) IVPB 1000 mg/200 mL premix       Placed in Followed by Linked Group   1,000 mg 200 mL/hr over 60 Minutes Intravenous  Once 06/26/24 2127 06/27/24 0111   06/26/24 2215  piperacillin -tazobactam (ZOSYN ) IVPB 3.375 g  Status:  Discontinued       Placed in And Linked Group   3.375 g 100 mL/hr over 30 Minutes Intravenous  Once 06/26/24 2118 06/26/24 2127   06/26/24 2215  vancomycin  (VANCOREADY) IVPB 1250 mg/250 mL       Placed in Followed by Linked Group   1,250 mg 166.7 mL/hr over 90 Minutes Intravenous  Once 06/26/24 2127 06/27/24 0000   06/26/24 2215  piperacillin -tazobactam (ZOSYN ) IVPB 3.375 g  Status:  Discontinued        3.375 g 12.5 mL/hr over 240 Minutes Intravenous Every 8 hours 06/26/24 2127 06/27/24 0958   06/26/24 2141  vancomycin  variable dose per unstable renal function (pharmacist dosing)  Status:  Discontinued         Does not apply See admin  instructions 06/26/24 2141 06/27/24 1004   06/26/24 1930  ciprofloxacin  (CIPRO ) IVPB 400 mg        400 mg 200 mL/hr over 60 Minutes Intravenous  Once 06/26/24 1923 06/26/24 2047              Family Communication/Anticipated D/C date and plan/Code Status   DVT prophylaxis:      Code Status: Full Code  Family Communication: None Disposition Plan: Plans to go to SNF   Status is: Inpatient Remains inpatient appropriate because: MRSA bacteremia, right foot osteomyelitis       Subjective:   Interval events noted.  He has no complaints.  He moved around  to the nurses station yesterday in a wheelchair.  Objective:    Vitals:   07/05/24 1601 07/05/24 2014 07/06/24 0352 07/06/24 0723  BP: 137/76 133/75 (!) 140/62 136/71  Pulse: 84 92 92 98  Resp: 18 14 16 18   Temp: 98 F (36.7 C) 97.9 F (36.6 C) 97.9 F (36.6 C) (!) 97.3 F (36.3 C)  TempSrc: Oral   Oral  SpO2: 100% 99% 100% 98%  Weight:      Height:       No data found.   Intake/Output Summary (Last 24 hours) at 07/06/2024 1300 Last data filed at 07/06/2024 0900 Gross per 24 hour  Intake 1080 ml  Output 750 ml  Net 330 ml   Filed Weights   06/26/24 1335  Weight: 102.1 kg    Exam:    GEN: NAD, sitting up in the chair SKIN: Warm and dry EYES: No pallor or icterus ENT: MMM CV: RRR PULM: CTA B ABD: soft, ND, NT, +BS CNS: AAO x 3, non focal EXT: Mild bilateral leg edema, no tenderness.  Dressing on feet are clean, dry and intact   Data Reviewed:   I have personally reviewed following labs and imaging studies:  Labs: Labs show the following:   Basic Metabolic Panel: Recent Labs  Lab 06/30/24 0422 07/02/24 1248  NA 139 138  K 4.7 4.2  CL 103 104  CO2 26 28  GLUCOSE 201* 74  BUN 23* 19  CREATININE 1.29* 1.14  CALCIUM  8.7* 8.8*   GFR Estimated Creatinine Clearance: 85.5 mL/min (by C-G formula based on SCr of 1.14 mg/dL). Liver Function Tests: No results for input(s): AST,  ALT, ALKPHOS, BILITOT, PROT, ALBUMIN in the last 168 hours.  No results for input(s): LIPASE, AMYLASE in the last 168 hours. No results for input(s): AMMONIA in the last 168 hours. Coagulation profile No results for input(s): INR, PROTIME in the last 168 hours.   CBC: Recent Labs  Lab 06/30/24 0422  WBC 13.4*  HGB 8.1*  HCT 25.7*  MCV 79.1*  PLT 472*   Cardiac Enzymes: Recent Labs  Lab 07/05/24 0401  CKTOTAL 63   BNP (last 3 results) No results for input(s): PROBNP in the last 8760 hours. CBG: Recent Labs  Lab 07/05/24 1134 07/05/24 1639 07/05/24 2137 07/06/24 0802 07/06/24 1113  GLUCAP 142* 178* 184* 213* 103*   D-Dimer: No results for input(s): DDIMER in the last 72 hours. Hgb A1c: No results for input(s): HGBA1C in the last 72 hours. Lipid Profile: No results for input(s): CHOL, HDL, LDLCALC, TRIG, CHOLHDL, LDLDIRECT in the last 72 hours. Thyroid  function studies: No results for input(s): TSH, T4TOTAL, T3FREE, THYROIDAB in the last 72 hours.  Invalid input(s): FREET3 Anemia work up: No results for input(s): VITAMINB12, FOLATE, FERRITIN, TIBC, IRON , RETICCTPCT in the last 72 hours. Sepsis Labs: Recent Labs  Lab 06/30/24 0422  WBC 13.4*    Microbiology Recent Results (from the past 240 hours)  Culture, blood (Routine x 2)     Status: Abnormal (Preliminary result)   Collection Time: 06/26/24  1:37 PM   Specimen: BLOOD  Result Value Ref Range Status   Specimen Description   Final    BLOOD RIGHT ANTECUBITAL Performed at [facility], [address], [place], [place] [address]    Special Requests   Final    BOTTLES DRAWN AEROBIC AND ANAEROBIC Blood Culture adequate volume Performed at [facility], [address] [place], [place] [address]    Culture  Setup Time   Final    GRAM POSITIVE COCCI IN BOTH AEROBIC AND ANAEROBIC BOTTLES CRITICAL RESULT CALLED TO, READ BACK BY  AND VERIFIED WITH: ERIN MEEGIN AT 1615 06/27/24.PMF Performed at [facility], [address] [place], [place] [address]    Culture (A)  Final    METHICILLIN RESISTANT STAPHYLOCOCCUS AUREUS Sent to [facility] for further susceptibility testing. Performed at [facility], [address], [place], [place] [address]    Report Status PENDING  Incomplete   Organism ID, Bacteria METHICILLIN RESISTANT STAPHYLOCOCCUS AUREUS  Final      Susceptibility   Methicillin resistant staphylococcus aureus - MIC*    CIPROFLOXACIN  >=8 RESISTANT Resistant     ERYTHROMYCIN >=8 RESISTANT Resistant     GENTAMICIN <=0.5 SENSITIVE Sensitive     OXACILLIN >=4 RESISTANT Resistant     TETRACYCLINE >=16 RESISTANT Resistant     VANCOMYCIN  1 SENSITIVE Sensitive     TRIMETH/SULFA >=320 RESISTANT Resistant     CLINDAMYCIN <=0.25 SENSITIVE Sensitive     RIFAMPIN <=0.5 SENSITIVE Sensitive     Inducible Clindamycin NEGATIVE Sensitive     LINEZOLID  2 SENSITIVE Sensitive     * METHICILLIN RESISTANT STAPHYLOCOCCUS AUREUS  Blood Culture ID Panel (Reflexed)     Status: Abnormal   Collection Time: 06/26/24  1:37 PM  Result Value Ref Range Status   Enterococcus faecalis NOT DETECTED NOT DETECTED Final   Enterococcus Faecium NOT DETECTED NOT DETECTED Final   Listeria monocytogenes NOT DETECTED NOT DETECTED Final   Staphylococcus species DETECTED (A) NOT DETECTED Final    Comment: CRITICAL RESULT CALLED TO, READ BACK BY AND VERIFIED WITH: ERIN MEEGIN AT 1615 06/27/24.PMF    Staphylococcus aureus (BCID) DETECTED (A) NOT DETECTED Final    Comment: Methicillin (oxacillin)-resistant Staphylococcus aureus (MRSA). MRSA is predictably resistant to beta-lactam antibiotics (except ceftaroline). Preferred therapy is vancomycin  unless clinically contraindicated. Patient requires contact precautions if  hospitalized. CRITICAL RESULT CALLED TO, READ BACK BY AND VERIFIED WITH: ERIN MEEGIN AT 1615 06/27/24.PMF     Staphylococcus epidermidis NOT DETECTED NOT DETECTED Final   Staphylococcus lugdunensis NOT DETECTED NOT DETECTED Final   Streptococcus species NOT DETECTED NOT DETECTED Final   Streptococcus agalactiae NOT DETECTED NOT DETECTED Final   Streptococcus pneumoniae NOT DETECTED NOT DETECTED Final   Streptococcus pyogenes NOT DETECTED NOT DETECTED Final   A.calcoaceticus-baumannii NOT DETECTED NOT DETECTED Final   Bacteroides fragilis NOT DETECTED NOT DETECTED Final   Enterobacterales NOT DETECTED NOT DETECTED Final   Enterobacter cloacae complex NOT DETECTED NOT DETECTED Final   Escherichia coli NOT DETECTED NOT DETECTED Final   Klebsiella aerogenes NOT DETECTED NOT DETECTED Final   Klebsiella oxytoca NOT DETECTED NOT DETECTED Final   Klebsiella pneumoniae NOT DETECTED NOT DETECTED Final   Proteus species NOT DETECTED NOT DETECTED Final   Salmonella species NOT DETECTED NOT DETECTED Final   Serratia marcescens NOT DETECTED NOT DETECTED Final   Haemophilus influenzae NOT DETECTED NOT DETECTED Final   Neisseria meningitidis NOT DETECTED NOT DETECTED Final   Pseudomonas aeruginosa NOT DETECTED NOT DETECTED Final   Stenotrophomonas maltophilia NOT DETECTED NOT DETECTED Final   Candida albicans NOT DETECTED NOT DETECTED Final   Candida auris NOT DETECTED NOT DETECTED Final   Candida glabrata NOT DETECTED NOT DETECTED Final   Candida krusei NOT DETECTED NOT DETECTED Final   Candida parapsilosis NOT DETECTED NOT DETECTED Final   Candida tropicalis NOT DETECTED NOT DETECTED Final   Cryptococcus neoformans/gattii NOT DETECTED NOT DETECTED Final  Meth resistant mecA/C and MREJ DETECTED (A) NOT DETECTED Final    Comment: CRITICAL RESULT CALLED TO, READ BACK BY AND VERIFIED WITH: ERIN MEEGIN AT 1615 06/27/24.PMF Performed at [facility], [address] [place], [place] [address]   Aerobic/Anaerobic Culture w Gram Stain (surgical/deep wound)     Status: None   Collection Time: 06/26/24   4:03 PM   Specimen: Foot  Result Value Ref Range Status   Specimen Description   Final    FOOT Performed at [facility], [address] [place], [place] [address]    Special Requests   Final    RIGHT FOOT Performed at [facility], [address] [place], [place] [address]    Gram Stain   Final    RARE WBC PRESENT, PREDOMINANTLY PMN NO ORGANISMS SEEN Performed at [facility], [address], [place], [place] [address]    Culture   Final    FEW STAPHYLOCOCCUS AUREUS MODERATE DIPHTHEROIDS(CORYNEBACTERIUM SPECIES) Standardized susceptibility testing for this organism is not available. RARE KLEBSIELLA PNEUMONIAE Confirmed Extended Spectrum Beta-Lactamase Producer (ESBL).  In bloodstream infections from ESBL organisms, carbapenems are preferred over piperacillin /tazobactam. They are shown to have a lower risk of mortality. RARE PROVIDENCIA STUARTII    Report Status 06/29/2024 FINAL  Final   Organism ID, Bacteria STAPHYLOCOCCUS AUREUS  Final   Organism ID, Bacteria KLEBSIELLA PNEUMONIAE  Final   Organism ID, Bacteria PROVIDENCIA STUARTII  Final      Susceptibility   Klebsiella pneumoniae - MIC*    AMPICILLIN  >=32 RESISTANT Resistant     CEFEPIME  >=32 RESISTANT Resistant     CEFTAZIDIME RESISTANT Resistant     CEFTRIAXONE  >=64 RESISTANT Resistant     CIPROFLOXACIN  >=4 RESISTANT Resistant     GENTAMICIN <=1 SENSITIVE Sensitive     IMIPENEM <=0.25 SENSITIVE Sensitive     TRIMETH/SULFA >=320 RESISTANT Resistant     AMPICILLIN /SULBACTAM >=32 RESISTANT Resistant     PIP/TAZO 16 SENSITIVE Sensitive ug/mL    * RARE KLEBSIELLA PNEUMONIAE   Providencia stuartii - MIC*    AMPICILLIN  >=32 RESISTANT Resistant     CEFEPIME  <=0.12 SENSITIVE Sensitive     CEFTAZIDIME <=1 SENSITIVE Sensitive     CEFTRIAXONE  <=0.25 SENSITIVE Sensitive     CIPROFLOXACIN  1 RESISTANT Resistant     GENTAMICIN RESISTANT Resistant     IMIPENEM 2 SENSITIVE Sensitive     TRIMETH/SULFA  <=20 SENSITIVE Sensitive     AMPICILLIN /SULBACTAM >=32 RESISTANT Resistant     PIP/TAZO <=4 SENSITIVE Sensitive ug/mL    * RARE PROVIDENCIA STUARTII   Staphylococcus aureus - MIC*    CIPROFLOXACIN  >=8 RESISTANT Resistant     ERYTHROMYCIN >=8 RESISTANT Resistant     GENTAMICIN <=0.5 SENSITIVE Sensitive     OXACILLIN >=4 RESISTANT Resistant     TETRACYCLINE >=16 RESISTANT Resistant     VANCOMYCIN  1 SENSITIVE Sensitive     TRIMETH/SULFA >=320 RESISTANT Resistant     CLINDAMYCIN <=0.25 SENSITIVE Sensitive     RIFAMPIN <=0.5 SENSITIVE Sensitive     Inducible Clindamycin NEGATIVE Sensitive     LINEZOLID  2 SENSITIVE Sensitive     * FEW STAPHYLOCOCCUS AUREUS  Culture, blood (Routine x 2)     Status: None   Collection Time: 06/26/24 10:46 PM   Specimen: BLOOD  Result Value Ref Range Status   Specimen Description BLOOD BLOOD RIGHT ARM  Final   Special Requests   Final    BOTTLES DRAWN AEROBIC AND ANAEROBIC Blood Culture adequate volume  Culture   Final    NO GROWTH 5 DAYS Performed at [facility], [address] [place], [place] [address]    Report Status 07/01/2024 FINAL  Final  Aerobic/Anaerobic Culture w Gram Stain (surgical/deep wound)     Status: None   Collection Time: 06/29/24 10:09 AM   Specimen: PATH Bone biopsy; Tissue  Result Value Ref Range Status   Specimen Description   Final    TISSUE Performed at [facility], [address] [place], [place] [address]    Special Requests   Final    BONE BOX Performed at [facility], [address] [place], [place] [address]    Gram Stain NO WBC SEEN NO ORGANISMS SEEN   Final   Culture   Final    FEW ESCHERICHIA COLI NO ANAEROBES ISOLATED Performed at [facility], [address] [place], [place] [address]    Report Status 07/04/2024 FINAL  Final   Organism ID, Bacteria ESCHERICHIA COLI  Final      Susceptibility   Escherichia coli - MIC*    AMPICILLIN  <=2 SENSITIVE Sensitive      CEFEPIME  <=0.12 SENSITIVE Sensitive     CEFTAZIDIME <=1 SENSITIVE Sensitive     CEFTRIAXONE  <=0.25 SENSITIVE Sensitive     CIPROFLOXACIN  <=0.25 SENSITIVE Sensitive     GENTAMICIN <=1 SENSITIVE Sensitive     IMIPENEM <=0.25 SENSITIVE Sensitive     TRIMETH/SULFA <=20 SENSITIVE Sensitive     AMPICILLIN /SULBACTAM <=2 SENSITIVE Sensitive     PIP/TAZO <=4 SENSITIVE Sensitive ug/mL    * FEW ESCHERICHIA COLI  Culture, blood (Routine X 2) w Reflex to ID Panel     Status: None   Collection Time: 06/30/24  5:28 PM   Specimen: BLOOD  Result Value Ref Range Status   Specimen Description   Final    BLOOD BLOOD RIGHT ARM Performed at [facility], [address], [place], [place] [address]    Special Requests   Final    BOTTLES DRAWN AEROBIC AND ANAEROBIC Blood Culture adequate volume Performed at [facility], [address] [place], [place] [address]    Culture  Setup Time   Final    GRAM POSITIVE RODS AEROBIC BOTTLE ONLY CRITICAL RESULT CALLED TO, READ BACK BY AND VERIFIED WITH: NATHAN PARK PHARMD [id number] 07/03/24 HNM GRAM STAIN REVIEWED-AGREE WITH RESULT DRT    Culture   Final    GRAM POSITIVE RODS CALL MICROBIOLOGY LAB IF SENSITIVITIES ARE REQUIRED. Performed at [facility], [address], [place], [place] [address]    Report Status 07/06/2024 FINAL  Final  Culture, blood (Routine X 2) w Reflex to ID Panel     Status: None   Collection Time: 06/30/24  5:33 PM   Specimen: BLOOD  Result Value Ref Range Status   Specimen Description BLOOD BLOOD LEFT ARM  Final   Special Requests   Final    BOTTLES DRAWN AEROBIC AND ANAEROBIC Blood Culture adequate volume   Culture   Final    NO GROWTH 5 DAYS Performed at [facility], [address], [place], [place] [address]    Report Status 07/05/2024 FINAL  Final    Procedures and diagnostic studies:  No results found.              LOS: 10 days   [name]  [facility]   Pager on  [email or web address]. If 7PM-7AM, please contact night-coverage at [email or web address]     07/06/2024,  1:00 PM

## 2024-07-06 NOTE — Plan of Care (Signed)
   Problem: Education: Goal: Knowledge of General Education information will improve Description: Including pain rating scale, medication(s)/side effects and non-pharmacologic comfort measures Outcome: Progressing   Problem: Clinical Measurements: Goal: Respiratory complications will improve Outcome: Progressing Goal: Cardiovascular complication will be avoided Outcome: Progressing

## 2024-07-07 DIAGNOSIS — M86171 Other acute osteomyelitis, right ankle and foot: Secondary | ICD-10-CM | POA: Diagnosis not present

## 2024-07-07 LAB — GLUCOSE, CAPILLARY
Glucose-Capillary: 133 mg/dL — ABNORMAL HIGH (ref 70–99)
Glucose-Capillary: 175 mg/dL — ABNORMAL HIGH (ref 70–99)
Glucose-Capillary: 116 mg/dL — ABNORMAL HIGH (ref 70–99)
Glucose-Capillary: 147 mg/dL — ABNORMAL HIGH (ref 70–99)

## 2024-07-07 NOTE — Progress Notes (Signed)
 Occupational Therapy Treatment Patient Details Name: [name] MRN: [id number] DOB: 1966/04/09 Today's Date: 07/07/2024   History of present illness Pt is a 58 y.o. who presented with redness, painn swelling, and discharge from the previous R TMA site on 06/26/24 and is now s/p revision TMA and debridement of chronic L foot ulcers with skin graft on 7/27. Per surgical note Pt is to be NWB for the most part at all times, but may apply a little bit of weight to his heels for transfers, but not to the balls of the feet. PMH includes: DM, DFI, MRSA bacteremia and MRSA foot infection, R TMA, left foot chronic wound.   OT comments  Pt seen for OT tx. Pt received in w/c, agreeable to session. Pt endorsing good pain control, 1/10 after medications prior to session. Pt indep with w/c negotiation in room and in hall with good safety awareness and able to negotiate obstacles well. Pt instructed in pressure relief and BUE ex, wound/skin monitoring for feet, and review of precautions and risks of poor adherence. Pt able to return demo and verbalizes understanding of risks/precautions. Pt progressing, continues to benefit from skilled OT services.       If plan is discharge home, recommend the following:  Assistance with cooking/housework;Help with stairs or ramp for entrance;Assist for transportation;A little help with walking and/or transfers;A little help with bathing/dressing/bathroom   Equipment Recommendations  Wheelchair (measurements OT);Wheelchair cushion (measurements OT);Tub/shower bench    Recommendations for Other Services      Precautions / Restrictions Precautions Precautions: Fall Recall of Precautions/Restrictions: Intact Restrictions Weight Bearing Restrictions Per Provider Order: Yes RLE Weight Bearing Per Provider Order: Non weight bearing LLE Weight Bearing Per Provider Order: Non weight bearing Other Position/Activity Restrictions: per podiatrist pt is allowed heel WB for  transfers only in post op shoes, no walking or WB through balls of the feet       Mobility Bed Mobility               General bed mobility comments: NT, in w/c pre/post session    Transfers                         Balance Overall balance assessment: No apparent balance deficits (not formally assessed)                                         ADL either performed or assessed with clinical judgement   ADL                                              Extremity/Trunk Assessment              Vision       Perception     Praxis     Communication     Cognition Arousal: Alert Behavior During Therapy: WFL for tasks assessed/performed Cognition: No apparent impairments                               Following commands: Intact        Cueing      Exercises Other Exercises Other Exercises: Pt instructed in pressure relief and BUE ex,  wound/skin monitoring for feet, and review of precautions and risks of poor adherence    Shoulder Instructions       General Comments      Pertinent Vitals/ Pain       Pain Assessment Pain Assessment: 0-10 Pain Score: 1  Pain Location: bilateral feet Pain Descriptors / Indicators: Sore Pain Intervention(s): Limited activity within patient's tolerance, Premedicated before session, Monitored during session, Repositioned  Home Living                                          Prior Functioning/Environment              Frequency  Min 2X/week        Progress Toward Goals  OT Goals(current goals can now be found in the care plan section)  Progress towards OT goals: Progressing toward goals  Acute Rehab OT Goals Patient Stated Goal: heal wounds OT Goal Formulation: With patient Time For Goal Achievement: 07/14/24 Potential to Achieve Goals: Good  Plan      Co-evaluation                 AM-PAC OT 6 Clicks Daily Activity      Outcome Measure   Help from another person eating meals?: None Help from another person taking care of personal grooming?: None Help from another person toileting, which includes using toliet, bedpan, or urinal?: A Little Help from another person bathing (including washing, rinsing, drying)?: A Little Help from another person to put on and taking off regular upper body clothing?: None Help from another person to put on and taking off regular lower body clothing?: A Little 6 Click Score: 21    End of Session    OT Visit Diagnosis: Other abnormalities of gait and mobility (R26.89);Pain Pain - Right/Left: Right Pain - part of body: Ankle and joints of foot   Activity Tolerance Patient tolerated treatment well   Patient Left Other (comment);with call bell/phone within reach (in w/c)   Nurse Communication          Time: 8865-8840 OT Time Calculation (min): 25 min  Charges: OT General Charges $OT Visit: 1 Visit OT Treatments $Therapeutic Activity: 23-37 mins  Warren SAUNDERS., MPH, MS, OTR/L ascom [phone] 07/07/24, 1:22 PM

## 2024-07-07 NOTE — Plan of Care (Signed)
   Problem: Activity: Goal: Risk for activity intolerance will decrease Outcome: Progressing   Problem: Nutrition: Goal: Adequate nutrition will be maintained Outcome: Progressing   Problem: Safety: Goal: Ability to remain free from injury will improve Outcome: Progressing

## 2024-07-07 NOTE — Progress Notes (Signed)
 Progress Note    [name]  FMW:[id number] DOB: 08/21/1966  DOA: 06/26/2024 PCP: [name], MD      Brief Narrative:    Medical records reviewed and are as summarized below:  [name] is a 58 y.o. male with medical history significant for hypertension, dyslipidemia, tobacco abuse, stage IIIa chronic kidney disease, cocaine abuse, and type 2 diabetes mellitus, who presented to the emergency room with acute onset of right foot pain with drainage from a recent toe amputation wound of clear fluid.  No fever or chills.    On presentation stable vital, labs with hyperglycemia at 345, creatinine 1.42, albumin 2.8, leukocytosis at 12.3 and hemoglobin of 8.7.  Wound and blood cultures were obtained.   MRI of the foot : 1. Status post interval second through fifth ray amputation at the level of the mid metatarsal shafts. Associated soft tissue wound extending along the dorsal stump into the deep soft tissues overlying the second through fifth digit metatarsal transsection margins which demonstrate T2/STIR hyperintense marrow edema with corresponding T1 hypointensity. These findings are concerning for osteomyelitis of the remnant second through fifth metatarsals, although, recent postsurgical change can demonstrate a similar appearance. Cellulitis cannot be excluded. No drainable abscess. 2. Diffuse nonspecific increased T2 signal of the intrinsic musculature of the foot, myositis cannot be excluded. 3. Tenosynovitis of the posterior tibialis, flexor digitorum, and flexor hallucis longus tendons.   He was admitted to the hospital for right foot osteomyelitis.  Blood culture showed MRSA consistent with MRSA bacteremia.   Assessment/Plan:   Principal Problem:   Acute osteomyelitis of right foot (HCC) Active Problems:   MRSA bacteremia   Uncontrolled type 2 diabetes mellitus with hyperglycemia, with long-term current use of insulin  (HCC)   Essential hypertension    Dyslipidemia   Wound infection   Endocarditis    Body mass index is 32.28 kg/m.  (Class I obesity)   Acute right foot infection/cellulitis, MRSA bacteremia: S/p right revision transmetatarsal amputation, left foot subcutaneous wound debridement with biopsy, left foot wound graft placement on 06/29/2024.  Surgical wound culture showed E. coli that is pansensitive. No acute inflammation or evidence of osteomyelitis on surgical pathology. S/p TEE on 07/02/2024 which was negative for vegetations.  Preserved EF. Repeat blood culture from 06/30/2024-1 out of 4 bottles grew gram-positive rods but this is likely a contaminant.  Species could not be identified by her local lab.  No need to send specimen to [facility] for species identification per  ID, [name].  Discussed via secure chat. Continue IV daptomycin  and IV ertapenem  through 08/06/2024.   Plan to order PICC line when disposition plans are clear and patient is close to being discharged from the hospital..   Follow-up with TOC to assist with placement to SNF.   Type II DM with hyperglycemia: Continue Semglee  25 units daily, NovoLog  5 units 3 times daily with meals and NovoLog  as needed for hyperglycemia.     Comorbidities include hypertension, dyslipidemia   Diet Order             Diet Carb Modified Fluid consistency: Thin; Room service appropriate? Yes  Diet effective now                            Consultants: ID specialist Cardiologist  Procedures: TEE on 07/02/2024    Medications:    ascorbic acid   500 mg Oral Daily   cyanocobalamin   1,000 mcg Oral Daily  enoxaparin  (LOVENOX ) injection  0.5 mg/kg Subcutaneous Q24H   folic acid   1 mg Oral Daily   gabapentin   300 mg Oral BID   insulin  aspart  0-15 Units Subcutaneous TID WC   insulin  aspart  0-5 Units Subcutaneous QHS   insulin  aspart  5 Units Subcutaneous TID WC   insulin  glargine-yfgn  25 Units Subcutaneous BID   iron  polysaccharides  150 mg Oral  Daily   losartan   25 mg Oral Daily   mupirocin  ointment  1 Application Nasal BID   Vitamin D  (Ergocalciferol )  50,000 Units Oral Q Tue   Continuous Infusions:  DAPTOmycin  800 mg (07/07/24 1329)   ertapenem  Stopped (07/07/24 9376)     Anti-infectives (From admission, onward)    Start     Dose/Rate Route Frequency Ordered Stop   07/05/24 0600  ertapenem  (INVANZ ) 1 g in sodium chloride  0.9 % 100 mL IVPB        1 g 200 mL/hr over 30 Minutes Intravenous Every 24 hours 07/04/24 0934     06/28/24 1145  DAPTOmycin  (CUBICIN ) 800 mg in sodium chloride  0.9 % IVPB        8 mg/kg  102.1 kg 132 mL/hr over 30 Minutes Intravenous Daily 06/28/24 1051     06/27/24 2200  linezolid  (ZYVOX ) tablet 600 mg  Status:  Discontinued        600 mg Oral Every 12 hours 06/27/24 1003 06/28/24 1048   06/27/24 1300  meropenem  (MERREM ) 1 g in sodium chloride  0.9 % 100 mL IVPB        1 g 200 mL/hr over 30 Minutes Intravenous Every 8 hours 06/27/24 0958 07/04/24 2248   06/27/24 0000  vancomycin  (VANCOCIN ) IVPB 1000 mg/200 mL premix       Placed in Followed by Linked Group   1,000 mg 200 mL/hr over 60 Minutes Intravenous  Once 06/26/24 2127 06/27/24 0111   06/26/24 2215  piperacillin -tazobactam (ZOSYN ) IVPB 3.375 g  Status:  Discontinued       Placed in And Linked Group   3.375 g 100 mL/hr over 30 Minutes Intravenous  Once 06/26/24 2118 06/26/24 2127   06/26/24 2215  vancomycin  (VANCOREADY) IVPB 1250 mg/250 mL       Placed in Followed by Linked Group   1,250 mg 166.7 mL/hr over 90 Minutes Intravenous  Once 06/26/24 2127 06/27/24 0000   06/26/24 2215  piperacillin -tazobactam (ZOSYN ) IVPB 3.375 g  Status:  Discontinued        3.375 g 12.5 mL/hr over 240 Minutes Intravenous Every 8 hours 06/26/24 2127 06/27/24 0958   06/26/24 2141  vancomycin  variable dose per unstable renal function (pharmacist dosing)  Status:  Discontinued         Does not apply See admin instructions 06/26/24 2141 06/27/24 1004    06/26/24 1930  ciprofloxacin  (CIPRO ) IVPB 400 mg        400 mg 200 mL/hr over 60 Minutes Intravenous  Once 06/26/24 1923 06/26/24 2047              Family Communication/Anticipated D/C date and plan/Code Status   DVT prophylaxis:      Code Status: Full Code  Family Communication: None Disposition Plan: Plans to go to SNF   Status is: Inpatient Remains inpatient appropriate because: MRSA bacteremia, right foot osteomyelitis       Subjective:   No acute events overnight.  He has no complaints.  Objective:    Vitals:   07/06/24 1507 07/06/24 2103 07/07/24 0836 07/07/24  1525  BP: (!) 141/68 136/76 (!) 154/87 113/81  Pulse: 89 84 84 88  Resp: 17 18 18 18   Temp: 98.5 F (36.9 C) 97.8 F (36.6 C) 97.6 F (36.4 C) 98.1 F (36.7 C)  TempSrc: Oral Oral Oral Oral  SpO2: 100% 100% 100% 100%  Weight:      Height:       No data found.   Intake/Output Summary (Last 24 hours) at 07/07/2024 1558 Last data filed at 07/07/2024 1450 Gross per 24 hour  Intake 1192.7 ml  Output 450 ml  Net 742.7 ml   Filed Weights   06/26/24 1335  Weight: 102.1 kg    Exam:   GEN: NAD, sitting up in his wheelchair. SKIN: Warm and dry EYES: No pallor or icterus ENT: MMM CV: RRR PULM: CTA B ABD: soft, ND, NT, +BS CNS: AAO x 3, non focal EXT: Mild bilateral leg edema.  Dressings on feet are clean and dry.    Data Reviewed:   I have personally reviewed following labs and imaging studies:  Labs: Labs show the following:   Basic Metabolic Panel: Recent Labs  Lab 07/02/24 1248  NA 138  K 4.2  CL 104  CO2 28  GLUCOSE 74  BUN 19  CREATININE 1.14  CALCIUM  8.8*   GFR Estimated Creatinine Clearance: 85.5 mL/min (by C-G formula based on SCr of 1.14 mg/dL). Liver Function Tests: No results for input(s): AST, ALT, ALKPHOS, BILITOT, PROT, ALBUMIN in the last 168 hours.  No results for input(s): LIPASE, AMYLASE in the last 168 hours. No results for  input(s): AMMONIA in the last 168 hours. Coagulation profile No results for input(s): INR, PROTIME in the last 168 hours.   CBC: No results for input(s): WBC, NEUTROABS, HGB, HCT, MCV, PLT in the last 168 hours.  Cardiac Enzymes: Recent Labs  Lab 07/05/24 0401  CKTOTAL 63   BNP (last 3 results) No results for input(s): PROBNP in the last 8760 hours. CBG: Recent Labs  Lab 07/06/24 1113 07/06/24 1618 07/06/24 2101 07/07/24 0835 07/07/24 1147  GLUCAP 103* 139* 149* 133* 175*   D-Dimer: No results for input(s): DDIMER in the last 72 hours. Hgb A1c: No results for input(s): HGBA1C in the last 72 hours. Lipid Profile: No results for input(s): CHOL, HDL, LDLCALC, TRIG, CHOLHDL, LDLDIRECT in the last 72 hours. Thyroid  function studies: No results for input(s): TSH, T4TOTAL, T3FREE, THYROIDAB in the last 72 hours.  Invalid input(s): FREET3 Anemia work up: No results for input(s): VITAMINB12, FOLATE, FERRITIN, TIBC, IRON , RETICCTPCT in the last 72 hours. Sepsis Labs: No results for input(s): PROCALCITON, WBC, LATICACIDVEN in the last 168 hours.   Microbiology Recent Results (from the past 240 hours)  Aerobic/Anaerobic Culture w Gram Stain (surgical/deep wound)     Status: None   Collection Time: 06/29/24 10:09 AM   Specimen: PATH Bone biopsy; Tissue  Result Value Ref Range Status   Specimen Description   Final    TISSUE Performed at [facility], [address] [place], [place] [address]    Special Requests   Final    BONE BOX Performed at [facility], [address] [place], [place] [address]    Gram Stain NO WBC SEEN NO ORGANISMS SEEN   Final   Culture   Final    FEW ESCHERICHIA COLI NO ANAEROBES ISOLATED Performed at [facility], [address] [place], [place] [address]    Report Status 07/04/2024 FINAL  Final  Organism ID, Bacteria ESCHERICHIA COLI  Final       Susceptibility   Escherichia coli - MIC*    AMPICILLIN  <=2 SENSITIVE Sensitive     CEFEPIME  <=0.12 SENSITIVE Sensitive     CEFTAZIDIME <=1 SENSITIVE Sensitive     CEFTRIAXONE  <=0.25 SENSITIVE Sensitive     CIPROFLOXACIN  <=0.25 SENSITIVE Sensitive     GENTAMICIN <=1 SENSITIVE Sensitive     IMIPENEM <=0.25 SENSITIVE Sensitive     TRIMETH/SULFA <=20 SENSITIVE Sensitive     AMPICILLIN /SULBACTAM <=2 SENSITIVE Sensitive     PIP/TAZO <=4 SENSITIVE Sensitive ug/mL    * FEW ESCHERICHIA COLI  Culture, blood (Routine X 2) w Reflex to ID Panel     Status: None   Collection Time: 06/30/24  5:28 PM   Specimen: BLOOD  Result Value Ref Range Status   Specimen Description   Final    BLOOD BLOOD RIGHT ARM Performed at [facility], [address] [place], [place] [address]    Special Requests   Final    BOTTLES DRAWN AEROBIC AND ANAEROBIC Blood Culture adequate volume Performed at [facility], [address] [place], [place] [address]    Culture  Setup Time   Final    GRAM POSITIVE RODS AEROBIC BOTTLE ONLY CRITICAL RESULT CALLED TO, READ BACK BY AND VERIFIED WITH: NATHAN PARK PHARMD [id number] 07/03/24 HNM GRAM STAIN REVIEWED-AGREE WITH RESULT DRT    Culture   Final    GRAM POSITIVE RODS CALL MICROBIOLOGY LAB IF SENSITIVITIES ARE REQUIRED. Performed at [facility], [address] [place], [place] [address]    Report Status 07/06/2024 FINAL  Final  Culture, blood (Routine X 2) w Reflex to ID Panel     Status: None   Collection Time: 06/30/24  5:33 PM   Specimen: BLOOD  Result Value Ref Range Status   Specimen Description BLOOD BLOOD LEFT ARM  Final   Special Requests   Final    BOTTLES DRAWN AEROBIC AND ANAEROBIC Blood Culture adequate volume   Culture   Final    NO GROWTH 5 DAYS Performed at [facility], [address] [place], [place] [address]    Report Status 07/05/2024 FINAL  Final    Procedures and diagnostic studies:  No results  found.              LOS: 11 days   [name]  [employer]ered loss adjuster on [email or web address]. If 7PM-7AM, please contact night-coverage at [email or web address]     07/07/2024, 3:58 PM

## 2024-07-07 NOTE — Progress Notes (Signed)
 PT Cancellation Note  Patient Details Name: [name] MRN: [id number] DOB: [birth year]   Cancelled Treatment:    Reason Eval/Treat Not Completed: Other (comment) (Patient declined. He is up in the wheelchair and wants to rest. PT to continue with attempts.)  Randine Essex, PT, MPT   Randine LULLA Essex 07/07/2024, 2:48 PM

## 2024-07-08 DIAGNOSIS — M86171 Other acute osteomyelitis, right ankle and foot: Secondary | ICD-10-CM | POA: Diagnosis not present

## 2024-07-08 LAB — CULTURE, BLOOD (ROUTINE X 2): Special Requests: ADEQUATE

## 2024-07-08 LAB — GLUCOSE, CAPILLARY
Glucose-Capillary: 79 mg/dL (ref 70–99)
Glucose-Capillary: 118 mg/dL — ABNORMAL HIGH (ref 70–99)
Glucose-Capillary: 168 mg/dL — ABNORMAL HIGH (ref 70–99)
Glucose-Capillary: 143 mg/dL — ABNORMAL HIGH (ref 70–99)
Glucose-Capillary: 210 mg/dL — ABNORMAL HIGH (ref 70–99)

## 2024-07-08 MED ORDER — INSULIN ASPART 100 UNIT/ML IJ SOLN
3.0000 [IU] | Freq: Three times a day (TID) | INTRAMUSCULAR | Status: DC
  Administered 2024-07-08 – 2024-07-14 (×22): 3 [IU] via SUBCUTANEOUS
  Filled 2024-07-08 (×19): qty 1

## 2024-07-08 NOTE — TOC Progression Note (Signed)
 Transition of Care [facility]) - Progression Note    Patient Details  Name: [name] MRN: [id number] Date of Birth: 1966-04-03  Transition of Care [facility]) CM/SW Contact  Alvaro Louder, [place] Phone Number: 07/08/2024, 2:17 PM  Clinical Narrative:  [facility] faxed out information to SNF's in [place] and [place] awaiting response.   TOC to follow for discharge.                       Expected Discharge Plan and Services                                               Social Drivers of Health (SDOH) Interventions SDOH Screenings   Food Insecurity: No Food Insecurity (06/26/2024)  Housing: Low Risk  (06/26/2024)  Transportation Needs: No Transportation Needs (06/26/2024)  Utilities: Not At Risk (06/26/2024)  Depression (PHQ2-9): Low Risk  (08/28/2023)  Financial Resource Strain: Patient Declined (06/28/2023)   Received from [place] [facility]  Social Connections: Moderately Integrated (06/26/2024)  Tobacco Use: Medium Risk (06/26/2024)  Health Literacy: Adequate Health Literacy (05/01/2024)    Readmission Risk Interventions     No data to display

## 2024-07-08 NOTE — Plan of Care (Signed)
  Problem: Education: Goal: Knowledge of General Education information will improve Description: Including pain rating scale, medication(s)/side effects and non-pharmacologic comfort measures Outcome: Progressing   Problem: Activity: Goal: Risk for activity intolerance will decrease Outcome: Progressing   Problem: Pain Managment: Goal: General experience of comfort will improve and/or be controlled Outcome: Progressing   Problem: Safety: Goal: Ability to remain free from injury will improve Outcome: Progressing   Problem: Metabolic: Goal: Ability to maintain appropriate glucose levels will improve Outcome: Progressing

## 2024-07-08 NOTE — Progress Notes (Addendum)
 Progress Note    [name]  FMW:[id number] DOB: 1966-07-21  DOA: 06/26/2024 PCP: [name], MD      Brief Narrative:    Medical records reviewed and are as summarized below:  [name] is a 58 y.o. male with medical history significant for hypertension, dyslipidemia, tobacco abuse, stage IIIa chronic kidney disease, cocaine abuse, and type 2 diabetes mellitus, who presented to the emergency room with acute onset of right foot pain with drainage from a recent toe amputation wound of clear fluid.  No fever or chills.    On presentation stable vital, labs with hyperglycemia at 345, creatinine 1.42, albumin 2.8, leukocytosis at 12.3 and hemoglobin of 8.7.  Wound and blood cultures were obtained.   MRI of the foot : 1. Status post interval second through fifth ray amputation at the level of the mid metatarsal shafts. Associated soft tissue wound extending along the dorsal stump into the deep soft tissues overlying the second through fifth digit metatarsal transsection margins which demonstrate T2/STIR hyperintense marrow edema with corresponding T1 hypointensity. These findings are concerning for osteomyelitis of the remnant second through fifth metatarsals, although, recent postsurgical change can demonstrate a similar appearance. Cellulitis cannot be excluded. No drainable abscess. 2. Diffuse nonspecific increased T2 signal of the intrinsic musculature of the foot, myositis cannot be excluded. 3. Tenosynovitis of the posterior tibialis, flexor digitorum, and flexor hallucis longus tendons.   He was admitted to the hospital for right foot osteomyelitis.  Blood culture showed MRSA consistent with MRSA bacteremia.   Assessment/Plan:   Principal Problem:   Acute osteomyelitis of right foot (HCC) Active Problems:   MRSA bacteremia   Uncontrolled type 2 diabetes mellitus with hyperglycemia, with long-term current use of insulin  (HCC)   Essential hypertension    Dyslipidemia   Wound infection   Endocarditis    Body mass index is 32.28 kg/m.  (Class I obesity)   Acute right foot infection/cellulitis, MRSA bacteremia: S/p right revision transmetatarsal amputation, left foot subcutaneous wound debridement with biopsy, left foot wound graft placement on 06/29/2024.  Surgical wound culture showed E. coli that is pansensitive. No acute inflammation or evidence of osteomyelitis on surgical pathology. S/p TEE on 07/02/2024 which was negative for vegetations.  Preserved EF. Repeat blood culture from 06/30/2024-1 out of 4 bottles grew gram-positive rods but this is likely a contaminant.  Species could not be identified by the local lab.   Continue IV daptomycin  and IV ertapenem  through 08/06/2024.   Plan to order PICC line when disposition plans are clear and patient is close to being discharged from the hospital..   Follow-up with TOC to assist with placement to SNF.   Type II DM with hyperglycemia: Continue Semglee  25 units daily, decrease NovoLog  from 5 units to 3 units 3 times daily with meals.  Continue NovoLog  as needed for hyperglycemia.     Comorbidities include hypertension, dyslipidemia   Diet Order             Diet Carb Modified Fluid consistency: Thin; Room service appropriate? Yes  Diet effective now                            Consultants: ID specialist Cardiologist  Procedures: TEE on 07/02/2024    Medications:    ascorbic acid   500 mg Oral Daily   cyanocobalamin   1,000 mcg Oral Daily   enoxaparin  (LOVENOX ) injection  0.5 mg/kg Subcutaneous Q24H   folic  acid  1 mg Oral Daily   gabapentin   300 mg Oral BID   insulin  aspart  0-15 Units Subcutaneous TID WC   insulin  aspart  0-5 Units Subcutaneous QHS   insulin  aspart  3 Units Subcutaneous TID WC   insulin  glargine-yfgn  25 Units Subcutaneous BID   iron  polysaccharides  150 mg Oral Daily   losartan   25 mg Oral Daily   mupirocin  ointment  1 Application Nasal BID    Vitamin D  (Ergocalciferol )  50,000 Units Oral Q Tue   Continuous Infusions:  DAPTOmycin  800 mg (07/08/24 1350)   ertapenem  1 g (07/08/24 0536)     Anti-infectives (From admission, onward)    Start     Dose/Rate Route Frequency Ordered Stop   07/05/24 0600  ertapenem  (INVANZ ) 1 g in sodium chloride  0.9 % 100 mL IVPB        1 g 200 mL/hr over 30 Minutes Intravenous Every 24 hours 07/04/24 0934     06/28/24 1145  DAPTOmycin  (CUBICIN ) 800 mg in sodium chloride  0.9 % IVPB        8 mg/kg  102.1 kg 132 mL/hr over 30 Minutes Intravenous Daily 06/28/24 1051     06/27/24 2200  linezolid  (ZYVOX ) tablet 600 mg  Status:  Discontinued        600 mg Oral Every 12 hours 06/27/24 1003 06/28/24 1048   06/27/24 1300  meropenem  (MERREM ) 1 g in sodium chloride  0.9 % 100 mL IVPB        1 g 200 mL/hr over 30 Minutes Intravenous Every 8 hours 06/27/24 0958 07/04/24 2248   06/27/24 0000  vancomycin  (VANCOCIN ) IVPB 1000 mg/200 mL premix       Placed in Followed by Linked Group   1,000 mg 200 mL/hr over 60 Minutes Intravenous  Once 06/26/24 2127 06/27/24 0111   06/26/24 2215  piperacillin -tazobactam (ZOSYN ) IVPB 3.375 g  Status:  Discontinued       Placed in And Linked Group   3.375 g 100 mL/hr over 30 Minutes Intravenous  Once 06/26/24 2118 06/26/24 2127   06/26/24 2215  vancomycin  (VANCOREADY) IVPB 1250 mg/250 mL       Placed in Followed by Linked Group   1,250 mg 166.7 mL/hr over 90 Minutes Intravenous  Once 06/26/24 2127 06/27/24 0000   06/26/24 2215  piperacillin -tazobactam (ZOSYN ) IVPB 3.375 g  Status:  Discontinued        3.375 g 12.5 mL/hr over 240 Minutes Intravenous Every 8 hours 06/26/24 2127 06/27/24 0958   06/26/24 2141  vancomycin  variable dose per unstable renal function (pharmacist dosing)  Status:  Discontinued         Does not apply See admin instructions 06/26/24 2141 06/27/24 1004   06/26/24 1930  ciprofloxacin  (CIPRO ) IVPB 400 mg        400 mg 200 mL/hr over 60 Minutes  Intravenous  Once 06/26/24 1923 06/26/24 2047              Family Communication/Anticipated D/C date and plan/Code Status   DVT prophylaxis:      Code Status: Full Code  Family Communication: None Disposition Plan: Plans to go to SNF   Status is: Inpatient Remains inpatient appropriate because: MRSA bacteremia, right foot osteomyelitis       Subjective:   Interval events noted.  Blood sugar was 79 this morning.  He said it was on the low side because he did not eat any snack last night.  He said he normally eats  snacks every evening.  He said his blood sugar was 55 some days ago because he did not eat any snack at that time. No other complaints.  Objective:    Vitals:   07/07/24 1525 07/07/24 2003 07/08/24 0319 07/08/24 0731  BP: 113/81 134/74 124/76 118/74  Pulse: 88 88 84 90  Resp: 18 18 18 16   Temp: 98.1 F (36.7 C) 98.2 F (36.8 C) 98.2 F (36.8 C) 98 F (36.7 C)  TempSrc: Oral   Oral  SpO2: 100% 100% 100% 100%  Weight:      Height:       No data found.   Intake/Output Summary (Last 24 hours) at 07/08/2024 1429 Last data filed at 07/08/2024 1300 Gross per 24 hour  Intake 512.64 ml  Output --  Net 512.64 ml   Filed Weights   06/26/24 1335  Weight: 102.1 kg    Exam:   GEN: NAD SKIN: Warm and dry EYES: No pallor or icterus ENT: MMM CV: RRR PULM: CTA B ABD: soft, obese, NT, +BS CNS: AAO x 3, non focal EXT: Mild bilateral leg edema.  Dressings on bilateral feet are clean, dry and intact      Data Reviewed:   I have personally reviewed following labs and imaging studies:  Labs: Labs show the following:   Basic Metabolic Panel: Recent Labs  Lab 07/02/24 1248  NA 138  K 4.2  CL 104  CO2 28  GLUCOSE 74  BUN 19  CREATININE 1.14  CALCIUM  8.8*   GFR Estimated Creatinine Clearance: 85.5 mL/min (by C-G formula based on SCr of 1.14 mg/dL). Liver Function Tests: No results for input(s): AST, ALT, ALKPHOS, BILITOT,  PROT, ALBUMIN in the last 168 hours.  No results for input(s): LIPASE, AMYLASE in the last 168 hours. No results for input(s): AMMONIA in the last 168 hours. Coagulation profile No results for input(s): INR, PROTIME in the last 168 hours.   CBC: No results for input(s): WBC, NEUTROABS, HGB, HCT, MCV, PLT in the last 168 hours.  Cardiac Enzymes: Recent Labs  Lab 07/05/24 0401  CKTOTAL 63   BNP (last 3 results) No results for input(s): PROBNP in the last 8760 hours. CBG: Recent Labs  Lab 07/07/24 1710 07/07/24 2128 07/08/24 0734 07/08/24 0908 07/08/24 1206  GLUCAP 116* 147* 79 118* 168*   D-Dimer: No results for input(s): DDIMER in the last 72 hours. Hgb A1c: No results for input(s): HGBA1C in the last 72 hours. Lipid Profile: No results for input(s): CHOL, HDL, LDLCALC, TRIG, CHOLHDL, LDLDIRECT in the last 72 hours. Thyroid  function studies: No results for input(s): TSH, T4TOTAL, T3FREE, THYROIDAB in the last 72 hours.  Invalid input(s): FREET3 Anemia work up: No results for input(s): VITAMINB12, FOLATE, FERRITIN, TIBC, IRON , RETICCTPCT in the last 72 hours. Sepsis Labs: No results for input(s): PROCALCITON, WBC, LATICACIDVEN in the last 168 hours.   Microbiology Recent Results (from the past 240 hours)  Aerobic/Anaerobic Culture w Gram Stain (surgical/deep wound)     Status: None   Collection Time: 06/29/24 10:09 AM   Specimen: PATH Bone biopsy; Tissue  Result Value Ref Range Status   Specimen Description   Final    TISSUE Performed at [facility], [address], [place], [place] [address]    Special Requests   Final    BONE BOX Performed at [facility], [address] [place], [place] [address]    Gram Stain NO WBC SEEN NO ORGANISMS SEEN   Final  Culture   Final    FEW ESCHERICHIA COLI NO ANAEROBES ISOLATED Performed at [facility], [address], [place], [place] [address]    Report Status 07/04/2024 FINAL  Final   Organism ID, Bacteria ESCHERICHIA COLI  Final      Susceptibility   Escherichia coli - MIC*    AMPICILLIN  <=2 SENSITIVE Sensitive     CEFEPIME  <=0.12 SENSITIVE Sensitive     CEFTAZIDIME <=1 SENSITIVE Sensitive     CEFTRIAXONE  <=0.25 SENSITIVE Sensitive     CIPROFLOXACIN  <=0.25 SENSITIVE Sensitive     GENTAMICIN <=1 SENSITIVE Sensitive     IMIPENEM <=0.25 SENSITIVE Sensitive     TRIMETH/SULFA <=20 SENSITIVE Sensitive     AMPICILLIN /SULBACTAM <=2 SENSITIVE Sensitive     PIP/TAZO <=4 SENSITIVE Sensitive ug/mL    * FEW ESCHERICHIA COLI  Culture, blood (Routine X 2) w Reflex to ID Panel     Status: None   Collection Time: 06/30/24  5:28 PM   Specimen: BLOOD  Result Value Ref Range Status   Specimen Description   Final    BLOOD BLOOD RIGHT ARM Performed at [facility], [address], [place], [place] [address]    Special Requests   Final    BOTTLES DRAWN AEROBIC AND ANAEROBIC Blood Culture adequate volume Performed at [facility], [address] [place], [place] [address]    Culture  Setup Time   Final    GRAM POSITIVE RODS AEROBIC BOTTLE ONLY CRITICAL RESULT CALLED TO, READ BACK BY AND VERIFIED WITH: NATHAN PARK PHARMD [id number] 07/03/24 HNM GRAM STAIN REVIEWED-AGREE WITH RESULT DRT    Culture   Final    GRAM POSITIVE RODS CALL MICROBIOLOGY LAB IF SENSITIVITIES ARE REQUIRED. Performed at [facility], [address] [place], [place] [address]    Report Status 07/06/2024 FINAL  Final  Culture, blood (Routine X 2) w Reflex to ID Panel     Status: None   Collection Time: 06/30/24  5:33 PM   Specimen: BLOOD  Result Value Ref Range Status   Specimen Description BLOOD BLOOD LEFT ARM  Final   Special Requests   Final    BOTTLES DRAWN AEROBIC AND ANAEROBIC Blood Culture adequate volume   Culture   Final    NO GROWTH 5 DAYS Performed at [facility], [address],  [place], [place] [address]    Report Status 07/05/2024 FINAL  Final    Procedures and diagnostic studies:  No results found.              LOS: 12 days   [name]  [employer] Chartered loss adjuster on [email or web address]. If 7PM-7AM, please contact night-coverage at [email or web address]     07/08/2024, 2:29 PM

## 2024-07-08 NOTE — NC FL2 (Signed)
 [place]  MEDICAID FL2 LEVEL OF CARE FORM     IDENTIFICATION  Patient Name: [name] Birthdate: [birth year] Sex: male Admission Date (Current Location): 06/26/2024  [facility] and [place] Number:  Chiropodist and Address:  [facility], [address], [place], [place] [address]      Provider Number: [id number]  Attending Physician Name and Address:  Jens Durand, MD  Relative Name and Phone Number:  Arloa Grice [facility])  [phone] [facility])    Current Level of Care: Hospital Recommended Level of Care: Skilled Nursing Facility Prior Approval Number:    Date Approved/Denied:   PASRR Number: [id number]  Discharge Plan: SNF    Current Diagnoses: Patient Active Problem List   Diagnosis Date Noted   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/02/2024   Wound infection 07/01/2024   Essential hypertension 06/27/2024   Dyslipidemia 06/27/2024   Uncontrolled type 2 diabetes mellitus with hyperglycemia, with long-term current use of insulin  (HCC) 06/27/2024   Acute osteomyelitis of right foot (HCC) 06/26/2024   MRSA bacteremia 04/21/2024   HLD (hyperlipidemia) 04/17/2024   Chronic kidney disease, stage 3a (HCC) 04/17/2024   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   Normocytic anemia 04/17/2024   Amputation of right great toe (HCC) 03/31/2024   Necrotizing soft tissue infection 03/26/2024   Hypertension associated with diabetes (HCC) 12/12/2022   Toe osteomyelitis, left (HCC) 12/12/2022   Pyogenic inflammation of bone (HCC) 06/02/2022   PICC (peripherally inserted central catheter) in place 06/02/2022   Medication management 06/02/2022   Diabetic foot infection (HCC) 06/02/2022   Smoking 06/02/2022   Tobacco abuse 05/06/2022   Hyperkalemia 05/06/2022   Type 2 diabetes mellitus (HCC) 05/05/2022   Obesity (BMI 30-39.9) 05/05/2022   Cellulitis and abscess of foot    AKI (acute kidney injury) (HCC) 05/04/2022   Injury of left foot     Diabetic foot ulcer (HCC) 12/14/2021    Orientation RESPIRATION BLADDER Height & Weight     Self, Time, Situation, Place  Normal Continent Weight: 225 lb (102.1 kg) Height:  5' 10 (177.8 cm)  BEHAVIORAL SYMPTOMS/MOOD NEUROLOGICAL BOWEL NUTRITION STATUS      Continent Diet (Carb Modified)  AMBULATORY STATUS COMMUNICATION OF NEEDS Skin   Limited Assist Verbally Surgical wounds (Closed incision right foot, Closed incision Left foot)                       Personal Care Assistance Level of Assistance  Bathing, Feeding, Dressing Bathing Assistance: Limited assistance Feeding assistance: Independent Dressing Assistance: Limited assistance     Functional Limitations Info             SPECIAL CARE FACTORS FREQUENCY  PT (By licensed PT), OT (By licensed OT)     PT Frequency: 5x/week OT Frequency: 5x/week            Contractures Contractures Info: Not present    Additional Factors Info  Code Status, Allergies Code Status Info: Full Allergies Info: No Known Allergies           Current Medications (07/08/2024):  This is the current hospital active medication list Current Facility-Administered Medications  Medication Dose Route Frequency Provider Last Rate Last Admin   acetaminophen  (TYLENOL ) tablet 650 mg  650 mg Oral Q6H PRN Lennie Barter, DPM   650 mg at 06/27/24 2209   Or   acetaminophen  (TYLENOL ) suppository 650 mg  650 mg Rectal Q6H PRN Lennie Barter, DPM       ascorbic acid  (VITAMIN  C) tablet 500 mg  500 mg Oral Daily Lennie Barter, DPM   500 mg at 07/08/24 9090   cyanocobalamin  (VITAMIN B12) tablet 1,000 mcg  1,000 mcg Oral Daily Lennie Barter, DPM   1,000 mcg at 07/08/24 9090   DAPTOmycin  (CUBICIN ) 800 mg in sodium chloride  0.9 % IVPB  8 mg/kg Intravenous Q1400 Lennie Barter, DPM 132 mL/hr at 07/08/24 1350 800 mg at 07/08/24 1350   enoxaparin  (LOVENOX ) injection 50 mg  0.5 mg/kg Subcutaneous Q24H Lennie Barter, DPM   50 mg at 07/07/24 2209   ertapenem  (INVANZ )  1 g in sodium chloride  0.9 % 100 mL IVPB  1 g Intravenous Q24H Zeigler, Dustin G, RPH 200 mL/hr at 07/08/24 0536 1 g at 07/08/24 0536   folic acid  (FOLVITE ) tablet 1 mg  1 mg Oral Daily Lennie Barter, DPM   1 mg at 07/08/24 9090   gabapentin  (NEURONTIN ) capsule 300 mg  300 mg Oral BID Lennie Barter, DPM   300 mg at 07/08/24 9090   insulin  aspart (novoLOG ) injection 0-15 Units  0-15 Units Subcutaneous TID WC Lennie Barter, DPM   3 Units at 07/08/24 1210   insulin  aspart (novoLOG ) injection 0-5 Units  0-5 Units Subcutaneous QHS Lennie Barter, DPM   3 Units at 07/03/24 2233   insulin  aspart (novoLOG ) injection 3 Units  3 Units Subcutaneous TID WC Jens Durand, MD   3 Units at 07/08/24 1211   insulin  glargine-yfgn (SEMGLEE ) injection 25 Units  25 Units Subcutaneous BID Amin, Sumayya, MD   25 Units at 07/08/24 9075   iron  polysaccharides (NIFEREX) capsule 150 mg  150 mg Oral Daily Lennie Barter, DPM   150 mg at 07/08/24 9090   losartan  (COZAAR ) tablet 25 mg  25 mg Oral Daily Lennie Barter, DPM   25 mg at 07/08/24 9090   methocarbamol  (ROBAXIN ) tablet 750 mg  750 mg Oral Q6H PRN Lennie Barter, DPM   750 mg at 07/01/24 9376   mupirocin  ointment (BACTROBAN ) 2 % 1 Application  1 Application Nasal BID Lennie Barter, DPM   1 Application at 07/07/24 2210   ondansetron  (ZOFRAN ) tablet 4 mg  4 mg Oral Q6H PRN Lennie Barter, DPM       Or   ondansetron  (ZOFRAN ) injection 4 mg  4 mg Intravenous Q6H PRN Lennie Barter, DPM       oxyCODONE -acetaminophen  (PERCOCET/ROXICET) 5-325 MG per tablet 1-2 tablet  1-2 tablet Oral Q4H PRN Lennie Barter, DPM   2 tablet at 07/08/24 0924   polyethylene glycol (MIRALAX  / GLYCOLAX ) packet 17 g  17 g Oral BID PRN Lennie Barter, DPM   17 g at 07/06/24 1829   traZODone  (DESYREL ) tablet 25 mg  25 mg Oral QHS PRN Lennie Barter, DPM   25 mg at 07/05/24 2140   Vitamin D  (Ergocalciferol ) (DRISDOL ) 1.25 MG (50000 UNIT) capsule 50,000 Units  50,000 Units Oral Q Sonjia Lennie Barter, DPM   50,000  Units at 07/08/24 9090     Discharge Medications: Please see discharge summary for a list of discharge medications.  Relevant Imaging Results:  Relevant Lab Results:   Additional Information SSN: [id number]  Odarius Dines  [place], LCSW

## 2024-07-08 NOTE — Progress Notes (Signed)
 Physical Therapy Treatment Patient Details Name: [name] MRN: [id number] DOB: 22-Mar-1966 Today's Date: 07/08/2024   History of Present Illness Pt is a 58 y.o. who presented with redness, painn swelling, and discharge from the previous R TMA site on 06/26/24 and is now s/p revision TMA and debridement of chronic L foot ulcers with skin graft on 7/27. Per surgical note Pt is to be NWB for the most part at all times, but may apply a little bit of weight to his heels for transfers, but not to the balls of the feet. PMH includes: DM, DFI, MRSA bacteremia and MRSA foot infection, R TMA, left foot chronic wound.    PT Comments  Pt was sitting in w/c with BLEs elevated upon arrival. He continues to endorse I'ly getting OOB into and out of w/c. Author issued HEP with black theraband for resistive. Pt demonstrated correct performance without physical assistance. Author discussed post acute care needs. I just don't have anyone who can help me at DC. Due to pt's BLE NWB, author feels pt would require 24/7 supervision/ assistance available when needed for meals/ stairs. Dcr recs remain appropriate due to pt's lack of available assistance and BLE NWB restrictions.    If plan is discharge home, recommend the following: Assistance with cooking/housework;Assist for transportation;A little help with walking and/or transfers;A little help with bathing/dressing/bathroom;Help with stairs or ramp for entrance     Equipment Recommendations  Other (comment)       Precautions / Restrictions Precautions Precautions: Fall Recall of Precautions/Restrictions: Intact Restrictions Weight Bearing Restrictions Per Provider Order: Yes RLE Weight Bearing Per Provider Order: Non weight bearing LLE Weight Bearing Per Provider Order: Non weight bearing Other Position/Activity Restrictions: per podiatrist pt is allowed heel WB for transfers only in post op shoes, no walking or WB through balls of the feet     Mobility  Bed  Mobility Overal bed mobility: Independent    Transfers Overall transfer level: Needs assistance Equipment used: Rolling walker (2 wheels) Transfers: Sit to/from Stand Sit to Stand: Modified independent (Device/Increase time) Stand pivot transfers: Modified independent (Device/Increase time)   Merchant [employer] officer mobility: Yes Wheelchair propulsion: Both upper extremities Wheelchair parts: Independent Distance: > 500 Wheelchair Assistance Details (indicate cue type and reason): Pt has been I'ly self propelling w/c on/off unit    Balance Overall balance assessment: No apparent balance deficits (not formally assessed)       Communication Communication Communication: No apparent difficulties  Cognition Arousal: Alert Behavior During Therapy: WFL for tasks assessed/performed   PT - Cognitive impairments: No apparent impairments    PT - Cognition Comments: pleasant and agreeable to session Following commands: Intact      Cueing Cueing Techniques: Verbal cues     General Comments General comments (skin integrity, edema, etc.): Pt was able issued blacktheraband and educated on exercises to promote strengthning in both UEs and BLEs. pt states understanding and demonstarted correct performance of exercises I'ly      Pertinent Vitals/Pain Pain Assessment Pain Assessment: No/denies pain Pain Score: 0-No pain     PT Goals (current goals can now be found in the care plan section) Acute Rehab PT Goals Patient Stated Goal: To go to rehab and recover Progress towards PT goals: Progressing toward goals    Frequency    Min 1X/week       AM-PAC PT 6 Clicks Mobility   Outcome Measure  Help needed turning from your back to your side while in a flat  bed without using bedrails?: None Help needed moving from lying on your back to sitting on the side of a flat bed without using bedrails?: None Help needed moving to and from a bed to a chair  (including a wheelchair)?: A Little Help needed standing up from a chair using your arms (e.g., wheelchair or bedside chair)?: A Little Help needed to walk in hospital room?: A Little Help needed climbing 3-5 steps with a railing? : A Lot 6 Click Score: 19    End of Session   Activity Tolerance: Patient tolerated treatment well Patient left: in chair;Other (comment) (In w/c at conclusion of session) Nurse Communication: Mobility status;Weight bearing status PT Visit Diagnosis: Pain;Other abnormalities of gait and mobility (R26.89)     Time: 9179-9158 PT Time Calculation (min) (ACUTE ONLY): 21 min  Charges:    $Therapeutic Exercise: 8-22 mins PT General Charges $$ ACUTE PT VISIT: 1 Visit                    Rankin Essex PTA 07/08/24, 12:38 PM

## 2024-07-09 DIAGNOSIS — M86171 Other acute osteomyelitis, right ankle and foot: Secondary | ICD-10-CM | POA: Diagnosis not present

## 2024-07-09 LAB — CBC WITH DIFFERENTIAL/PLATELET
Abs Immature Granulocytes: 0.04 K/uL (ref 0.00–0.07)
Basophils Absolute: 0 K/uL (ref 0.0–0.1)
Basophils Relative: 0 %
Eosinophils Absolute: 0.2 K/uL (ref 0.0–0.5)
Eosinophils Relative: 2 %
HCT: 28.6 % — ABNORMAL LOW (ref 39.0–52.0)
Hemoglobin: 8.7 g/dL — ABNORMAL LOW (ref 13.0–17.0)
Immature Granulocytes: 1 %
Lymphocytes Relative: 42 %
Lymphs Abs: 3 K/uL (ref 0.7–4.0)
MCH: 24.5 pg — ABNORMAL LOW (ref 26.0–34.0)
MCHC: 30.4 g/dL (ref 30.0–36.0)
MCV: 80.6 fL (ref 80.0–100.0)
Monocytes Absolute: 0.5 K/uL (ref 0.1–1.0)
Monocytes Relative: 8 %
Neutro Abs: 3.4 K/uL (ref 1.7–7.7)
Neutrophils Relative %: 47 %
Platelets: 450 K/uL — ABNORMAL HIGH (ref 150–400)
RBC: 3.55 MIL/uL — ABNORMAL LOW (ref 4.22–5.81)
RDW: 15.4 % (ref 11.5–15.5)
WBC: 7 K/uL (ref 4.0–10.5)
nRBC: 0 % (ref 0.0–0.2)

## 2024-07-09 LAB — COMPREHENSIVE METABOLIC PANEL WITH GFR
ALT: 15 U/L (ref 0–44)
AST: 12 U/L — ABNORMAL LOW (ref 15–41)
Albumin: 2.8 g/dL — ABNORMAL LOW (ref 3.5–5.0)
Alkaline Phosphatase: 71 U/L (ref 38–126)
Anion gap: 5 (ref 5–15)
BUN: 26 mg/dL — ABNORMAL HIGH (ref 6–20)
CO2: 26 mmol/L (ref 22–32)
Calcium: 8.7 mg/dL — ABNORMAL LOW (ref 8.9–10.3)
Chloride: 108 mmol/L (ref 98–111)
Creatinine, Ser: 1.24 mg/dL (ref 0.61–1.24)
GFR, Estimated: >60 mL/min (ref >60)
Glucose, Bld: 145 mg/dL — ABNORMAL HIGH (ref 70–99)
Potassium: 4.3 mmol/L (ref 3.5–5.1)
Sodium: 139 mmol/L (ref 135–145)
Total Bilirubin: 0.6 mg/dL (ref 0.0–1.2)
Total Protein: 7.3 g/dL (ref 6.5–8.1)

## 2024-07-09 LAB — GLUCOSE, CAPILLARY
Glucose-Capillary: 136 mg/dL — ABNORMAL HIGH (ref 70–99)
Glucose-Capillary: 130 mg/dL — ABNORMAL HIGH (ref 70–99)
Glucose-Capillary: 135 mg/dL — ABNORMAL HIGH (ref 70–99)
Glucose-Capillary: 162 mg/dL — ABNORMAL HIGH (ref 70–99)

## 2024-07-09 LAB — MINIMUM INHIBITORY CONC. (1 DRUG)

## 2024-07-09 LAB — MIC RESULT

## 2024-07-09 NOTE — Progress Notes (Signed)
 PROGRESS NOTE    [name]  FMW:[id number] DOB: 01-18-1966 DOA: 06/26/2024 PCP: [name], MD   Assessment & Plan:   Principal Problem:   Acute osteomyelitis of right foot (HCC) Active Problems:   MRSA bacteremia   Uncontrolled type 2 diabetes mellitus with hyperglycemia, with long-term current use of insulin  [facility])   Essential hypertension   Dyslipidemia   Wound infection   Endocarditis  Assessment and Plan: Acute b/l foot infection/cellulitis: s/p right revision transmetatarsal amputation, left foot subcutaneous wound debridement with biopsy, left foot wound graft placement on 06/29/2024.  Surgical wound culture showed E. coli that is pansensitive. No evidence of osteomyelitis on path  MRSA bacteremia:s/p TEE on 07/02/2024 which was negative for vegetations.  Preserved EF. Repeat blood culture from 06/30/2024-1 out of 4 bottles grew gram-positive rods but this is likely a contaminant.  Species could not be identified by the local lab.  Continue on IV dapto & ertapenem  through 08/06/24. Needs a PICC line prior to d/c    DM2: likely poorly controlled. Continue on glargine, aspart, SSI w/ accuchecks   HTN: continue on home dose of losartan   HLD: continue on statin        DVT prophylaxis: lovenox  Code Status: full Family Communication:  Disposition Plan: needs SNF placement  Level of care: Med-Surg  Status is: Inpatient Remains inpatient appropriate because: needs SNF placement   Consultants:    Procedures:  Antimicrobials: dapto, ertapenem    Subjective: Pt c/o foot pain  Objective: Vitals:   07/08/24 1535 07/08/24 2022 07/09/24 0358 07/09/24 0805  BP: 118/78 (!) 141/81 (!) 145/75 132/85  Pulse: 89 89 86 89  Resp: 16 18 18 16   Temp: 97.9 F (36.6 C) 98.5 F (36.9 C) 98.1 F (36.7 C) 97.7 F (36.5 C)  TempSrc: Oral Oral Oral   SpO2: 100% 99% 97% 100%  Weight:      Height:        Intake/Output Summary (Last 24 hours) at 07/09/2024 1009 Last data  filed at 07/08/2024 1900 Gross per 24 hour  Intake 480 ml  Output --  Net 480 ml   Filed Weights   06/26/24 1335  Weight: 102.1 kg    Examination:  General exam: Appears calm and comfortable  Respiratory system: Clear to auscultation. Respiratory effort normal. Cardiovascular system: S1 & S2 +. No rubs, gallops or clicks.  Gastrointestinal system: Abdomen is nondistended, soft and nontender.  Hypoactive bowel sounds heard. Central nervous system: Alert and oriented.Moves all extremities Psychiatry: Judgement and insight appear normal. Mood & affect appropriate.     Data Reviewed: I have personally reviewed following labs and imaging studies  CBC: Recent Labs  Lab 07/09/24 0503  WBC 7.0  NEUTROABS 3.4  HGB 8.7*  HCT 28.6*  MCV 80.6  PLT 450*   Basic Metabolic Panel: Recent Labs  Lab 07/02/24 1248 07/09/24 0503  NA 138 139  K 4.2 4.3  CL 104 108  CO2 28 26  GLUCOSE 74 145*  BUN 19 26*  CREATININE 1.14 1.24  CALCIUM  8.8* 8.7*   GFR: Estimated Creatinine Clearance: 78.6 mL/min (by C-G formula based on SCr of 1.24 mg/dL). Liver Function Tests: Recent Labs  Lab 07/09/24 0503  AST 12*  ALT 15  ALKPHOS 71  BILITOT 0.6  PROT 7.3  ALBUMIN 2.8*   No results for input(s): LIPASE, AMYLASE in the last 168 hours. No results for input(s): AMMONIA in the last 168 hours. Coagulation Profile: No results for input(s): INR, PROTIME in the  last 168 hours. Cardiac Enzymes: Recent Labs  Lab 07/05/24 0401  CKTOTAL 63   BNP (last 3 results) No results for input(s): PROBNP in the last 8760 hours. HbA1C: No results for input(s): HGBA1C in the last 72 hours. CBG: Recent Labs  Lab 07/08/24 0908 07/08/24 1206 07/08/24 1643 07/08/24 2020 07/09/24 0739  GLUCAP 118* 168* 143* 210* 136*   Lipid Profile: No results for input(s): CHOL, HDL, LDLCALC, TRIG, CHOLHDL, LDLDIRECT in the last 72 hours. Thyroid  Function Tests: No results for  input(s): TSH, T4TOTAL, FREET4, T3FREE, THYROIDAB in the last 72 hours. Anemia Panel: No results for input(s): VITAMINB12, FOLATE, FERRITIN, TIBC, IRON , RETICCTPCT in the last 72 hours. Sepsis Labs: No results for input(s): PROCALCITON, LATICACIDVEN in the last 168 hours.  Recent Results (from the past 240 hours)  Culture, blood (Routine X 2) w Reflex to ID Panel     Status: None   Collection Time: 06/30/24  5:28 PM   Specimen: BLOOD  Result Value Ref Range Status   Specimen Description   Final    BLOOD BLOOD RIGHT ARM Performed at [facility], [address], [place], [place] [address]    Special Requests   Final    BOTTLES DRAWN AEROBIC AND ANAEROBIC Blood Culture adequate volume Performed at [facility], [address] [place], [place] [address]    Culture  Setup Time   Final    GRAM POSITIVE RODS AEROBIC BOTTLE ONLY CRITICAL RESULT CALLED TO, READ BACK BY AND VERIFIED WITH: NATHAN PARK PHARMD [id number] 07/03/24 HNM GRAM STAIN REVIEWED-AGREE WITH RESULT DRT    Culture   Final    GRAM POSITIVE RODS CALL MICROBIOLOGY LAB IF SENSITIVITIES ARE REQUIRED. Performed at [facility], [address] [place], [place] [address]    Report Status 07/06/2024 FINAL  Final  Culture, blood (Routine X 2) w Reflex to ID Panel     Status: None   Collection Time: 06/30/24  5:33 PM   Specimen: BLOOD  Result Value Ref Range Status   Specimen Description BLOOD BLOOD LEFT ARM  Final   Special Requests   Final    BOTTLES DRAWN AEROBIC AND ANAEROBIC Blood Culture adequate volume   Culture   Final    NO GROWTH 5 DAYS Performed at [facility], [address] [place], [place] [address]    Report Status 07/05/2024 FINAL  Final         Radiology Studies: No results found.      Scheduled Meds:  ascorbic acid   500 mg Oral Daily   cyanocobalamin   1,000 mcg Oral Daily   enoxaparin  (LOVENOX ) injection  0.5 mg/kg Subcutaneous Q24H    folic acid   1 mg Oral Daily   gabapentin   300 mg Oral BID   insulin  aspart  0-15 Units Subcutaneous TID WC   insulin  aspart  0-5 Units Subcutaneous QHS   insulin  aspart  3 Units Subcutaneous TID WC   insulin  glargine-yfgn  25 Units Subcutaneous BID   iron  polysaccharides  150 mg Oral Daily   losartan   25 mg Oral Daily   mupirocin  ointment  1 Application Nasal BID   Vitamin D  (Ergocalciferol )  50,000 Units Oral Q Tue   Continuous Infusions:  DAPTOmycin  800 mg (07/08/24 1350)   ertapenem  1 g (07/09/24 0535)     LOS: 13 days      Anthony CHRISTELLA Pouch, MD [facility] Pager [phone]  If 7PM-7AM, please contact night-coverage 07/09/2024, 10:09 AM

## 2024-07-09 NOTE — Progress Notes (Signed)
 Occupational Therapy Treatment Patient Details Name: [name] MRN: [id number] DOB: May 27, 1966 Today's Date: 07/09/2024   History of present illness Pt is a 58 y.o. who presented with redness, painn swelling, and discharge from the previous R TMA site on 06/26/24 and is now s/p revision TMA and debridement of chronic L foot ulcers with skin graft on 7/27. Per surgical note Pt is to be NWB for the most part at all times, but may apply a little bit of weight to his heels for transfers, but not to the balls of the feet. PMH includes: DM, DFI, MRSA bacteremia and MRSA foot infection, R TMA, left foot chronic wound.   OT comments  Provided ongoing education re: importance of adhering to WB requirements, with pt verbalizing understanding and able to provide teach-back via bed<WC transfer and WC<>toilet transfer. Provided guidance re: making sure both wheels of WC are locked prior to transferring. Pt able to don post-op shoes INDly. Podiatrist in room and reiterated that pt could do minimal WBing on heels while wearing post-op shoes. Will continue to follow POC.      If plan is discharge home, recommend the following:  Assistance with cooking/housework;Help with stairs or ramp for entrance;Assist for transportation;A little help with walking and/or transfers;A little help with bathing/dressing/bathroom   Equipment Recommendations       Recommendations for Other Services      Precautions / Restrictions Precautions Precautions: Fall Restrictions Weight Bearing Restrictions Per Provider Order: Yes RLE Weight Bearing Per Provider Order: Non weight bearing LLE Weight Bearing Per Provider Order: Non weight bearing Other Position/Activity Restrictions: per podiatrist pt is allowed heel WB for transfers only in post op shoes, no walking or WB through balls of the feet       Mobility Bed Mobility Overal bed mobility: Independent                  Transfers Overall transfer level: Needs  assistance Equipment used:  (pt-propelled WC) Transfers:  (sitting in WC to sitting on toilet) Sit to Stand: Supervision Stand pivot transfers: Supervision         General transfer comment: Pt able to transfer WC<>toilet while adhering to [facility] precautions     Balance Overall balance assessment: No apparent balance deficits (not formally assessed)                                         ADL either performed or assessed with clinical judgement   ADL                       Lower Body Dressing: Modified independent Lower Body Dressing Details (indicate cue type and reason): donning post-op shoes Toilet Transfer: Radiographer, therapeutic Details (indicate cue type and reason): Pt able to use WC to get to bathroom, transfer to toilet while adhering to [facility] requirements                Extremity/Trunk Assessment Upper Extremity Assessment Upper Extremity Assessment: Overall WFL for tasks assessed   Lower Extremity Assessment RLE Deficits / Details: NWBing Bilateral Feet LLE Deficits / Details: NWBing Bilateral Feet, chronic wound on L        Vision       Perception     Praxis     Communication Communication Communication: No apparent difficulties   Cognition Arousal: Alert Behavior During Therapy: [facility]  for tasks assessed/performed Cognition: No apparent impairments                                        Cueing      Exercises Other Exercises Other Exercises: Pt education/reinforcement on the importance of BLE WB status compliance    Shoulder Instructions       General Comments      Pertinent Vitals/ Pain       Pain Assessment Pain Score: 3  Pain Location: r foot Pain Descriptors / Indicators: Aching Pain Intervention(s): Repositioned, Relaxation  Home Living                                          Prior Functioning/Environment              Frequency  Min 2X/week         Progress Toward Goals  OT Goals(current goals can now be found in the care plan section)  Progress towards OT goals: Progressing toward goals  Acute Rehab OT Goals Patient Stated Goal: heal wounds OT Goal Formulation: With patient Time For Goal Achievement: 07/14/24 Potential to Achieve Goals: Good  Plan      Co-evaluation                 AM-PAC OT 6 Clicks Daily Activity     Outcome Measure   Help from another person eating meals?: None Help from another person taking care of personal grooming?: None Help from another person toileting, which includes using toliet, bedpan, or urinal?: A Little Help from another person bathing (including washing, rinsing, drying)?: A Little Help from another person to put on and taking off regular upper body clothing?: None Help from another person to put on and taking off regular lower body clothing?: A Little 6 Click Score: 21    End of Session Equipment Utilized During Treatment: Other (comment) (WC)  OT Visit Diagnosis: Other abnormalities of gait and mobility (R26.89);Pain Pain - Right/Left: Right Pain - part of body: Ankle and joints of foot   Activity Tolerance Patient tolerated treatment well   Patient Left in bed;with call bell/phone within reach;with nursing/sitter in room;Other (comment)   Nurse Communication          Time: 8394-8369 OT Time Calculation (min): 25 min  Charges: OT General Charges $OT Visit: 1 Visit OT Treatments $Self Care/Home Management : 23-37 mins Suzen Hock, PhD, MS, OTR/L 07/09/24, 4:49 PM

## 2024-07-09 NOTE — Plan of Care (Signed)
  Problem: Education: Goal: Knowledge of General Education information will improve Description: Including pain rating scale, medication(s)/side effects and non-pharmacologic comfort measures Outcome: Progressing   Problem: Activity: Goal: Risk for activity intolerance will decrease Outcome: Progressing   Problem: Nutrition: Goal: Adequate nutrition will be maintained Outcome: Progressing   Problem: Elimination: Goal: Will not experience complications related to bowel motility Outcome: Progressing   Problem: Pain Managment: Goal: General experience of comfort will improve and/or be controlled Outcome: Progressing   Problem: Safety: Goal: Ability to remain free from injury will improve Outcome: Progressing   Problem: Metabolic: Goal: Ability to maintain appropriate glucose levels will improve Outcome: Progressing   Problem: Skin Integrity: Goal: Risk for impaired skin integrity will decrease Outcome: Progressing   Problem: Tissue Perfusion: Goal: Adequacy of tissue perfusion will improve Outcome: Progressing

## 2024-07-09 NOTE — Progress Notes (Signed)
 PODIATRIC SURGERY - PROGRESS NOTE  DOS: 06/29/24   HPI: [name] is a 58 y.o. male who presents resting in bedside chair comfortably status post right transmetatarsal amputation revision and left foot wound debridements with skin graft placement.  Dressings with mild sanguinous strikethrough noted to the RIGHT foot - LEFT is clean, dry, and intact bilateral lower extremities.  Patient has remained nonweightbearing since the procedure - heel touch down for transfer.  He notes minimal to no pain today overall.  PMHx:  Past Medical History:  Diagnosis Date   Amputation of right great toe (HCC)    Cellulitis and abscess of foot    CKD stage 3a, GFR 45-59 ml/min (HCC)    Cocaine abuse (HCC)    + UDS on 05-12-24   DM (diabetes mellitus), type 2 (HCC)    ESBL (extended spectrum beta-lactamase) producing bacteria infection 04/18/2024   Hyperlipidemia    Hypertension    MRSA bacteremia    Normocytic anemia    Obesity    Tobacco abuse    Toe osteomyelitis, left (HCC)    Tuberculosis    tested positive,treated with medications.    Surgical Hx:  Past Surgical History:  Procedure Laterality Date   AMPUTATION TOE Right 03/26/2024   Procedure: AMPUTATION, TOE;  Surgeon: Harden Jerona GAILS, MD;  Location: [facility] OR;  Service: Orthopedics;  Laterality: Right;  RIGHT GREAT TOE AMPUTATION   BONE BIOPSY  04/18/2024   Procedure: BIOPSY, BONE (2ND METATARSAL);  Surgeon: Ashley Soulier, DPM;  Location: [facility] ORS;  Service: Orthopedics/Podiatry;;   FOOT SURGERY Left    I & D EXTREMITY Left 05/05/2022   Procedure: LEFT FOOT DEBRIDEMENT;  Surgeon: Harden Jerona GAILS, MD;  Location: [facility] OR;  Service: Orthopedics;  Laterality: Left;   INCISION AND DRAINAGE OF WOUND Right 06/05/2024   Procedure: DELAYED PRIMARY CLOSURE OF RIGHT FOOT WOUND, EXCISION OF DISTAL METATARSAL 2, 3, 4, and 5;  Surgeon: Ashley Soulier, DPM;  Location: [facility] ORS;  Service: Orthopedics/Podiatry;  Laterality: Right;   IRRIGATION AND DEBRIDEMENT FOOT  Right 04/18/2024   Procedure: IRRIGATION AND DEBRIDEMENT FOOT, PLACEMENT OF ANTIBIOTIC BEADS;  Surgeon: Ashley Soulier, DPM;  Location: [facility] ORS;  Service: Orthopedics/Podiatry;  Laterality: Right;   IRRIGATION AND DEBRIDEMENT FOOT Left 06/29/2024   Procedure: IRRIGATION AND DEBRIDEMENT FOOT;  Surgeon: Lennie Barter, DPM;  Location: [facility] ORS;  Service: Orthopedics/Podiatry;  Laterality: Left;   MYRINGOTOMY WITH TUBE PLACEMENT Bilateral 07/20/2022   Procedure: MYRINGOTOMY WITH TUBE PLACEMENT;  Surgeon: Edda Mt, MD;  Location[facility];  Service: ENT;  Laterality: Bilateral;  Diabetic   TEE WITHOUT CARDIOVERSION N/A 07/02/2024   Procedure: ECHOCARDIOGRAM, TRANSESOPHAGEAL;  Surgeon: Perla Evalene PARAS, MD;  Location: [facility] ORS;  Service: Cardiovascular;  Laterality: N/A;   TONSILLECTOMY     TRANSMETATARSAL AMPUTATION Right 04/23/2024   Procedure: AMPUTATION, FOOT, TRANSMETATARSAL;  Surgeon: Ashley Soulier, DPM;  Location: [facility] ORS;  Service: Orthopedics/Podiatry;  Laterality: Right;   TRANSMETATARSAL AMPUTATION Right 06/29/2024   Procedure: AMPUTATION, FOOT, TRANSMETATARSAL;  Surgeon: Lennie Barter, DPM;  Location: [facility] ORS;  Service: Orthopedics/Podiatry;  Laterality: Right;  RIGHT TRANSMETATARSAL AMPUTATION REVISION    FHx:  Family History  Problem Relation Age of Onset   Colon cancer Neg Hx    Colon polyps Neg Hx    Crohn's disease Neg Hx    Esophageal cancer Neg Hx    Rectal cancer Neg Hx    Stomach cancer Neg Hx    Ulcerative colitis Neg Hx     Social  History:  reports that he quit smoking about 21 months ago. His smoking use included cigarettes. He started smoking about 26 years ago. He has a 6.3 pack-year smoking history. He has never used smokeless tobacco. He reports that he does not drink alcohol and does not use drugs.  Allergies: No Known Allergies  Medications Prior to Admission  Medication Sig Dispense Refill   ascorbic acid  (VITAMIN C ) 500 MG tablet Take 1  tablet (500 mg total) by mouth daily. 30 tablet 2   bisacodyl  (DULCOLAX) 5 MG EC tablet Take 2 tablets (10 mg total) by mouth at bedtime as needed for moderate constipation. 30 tablet 0   chlorhexidine  (HIBICLENS ) 4 % external liquid Apply 15 mLs (1 Application total) topically as directed for 30 doses. Use as directed daily for 5 days every other week for 6 weeks. 946 mL 1   cyanocobalamin  1000 MCG tablet Take 1 tablet (1,000 mcg total) by mouth daily. 90 tablet 0   gabapentin  (NEURONTIN ) 300 MG capsule Take 1 capsule (300 mg total) by mouth at bedtime. 30 capsule 3   insulin  aspart (NOVOLOG ) 100 UNIT/ML FlexPen Inject 0-15 Units into the skin 3 (three) times daily before meals.  Correction coverage: Moderate (average weight, post-op) CBG 70 - 120: 0 units CBG 121 - 150: 2 units CBG 151 - 200: 3 units CBG 201 - 250: 5 units CBG 251 - 300: 8 units CBG 301 - 350: 11 units CBG 351 - 400: 15 units CBG > 400: call MD 15 mL 11   Insulin  NPH, Human,, Isophane, (HUMULIN  N KWIKPEN) 100 UNIT/ML Kiwkpen Inject 22 Units into the skin 2 (two) times daily. 30 mL 5   iron  polysaccharides (NIFEREX) 150 MG capsule Take 1 capsule (150 mg total) by mouth daily. 90 capsule 0   losartan  (COZAAR ) 25 MG tablet Take 1 tablet (25 mg total) by mouth daily. (Patient taking differently: Take 25 mg by mouth every morning.) 30 tablet 11   metFORMIN  (GLUCOPHAGE -XR) 500 MG 24 hr tablet Take 2 tablets (1,000 mg total) by mouth 2 (two) times daily with a meal. 120 tablet 1   [EXPIRED] mupirocin  ointment (BACTROBAN ) 2 % Use as directed into the nose  2 times daily for 5 days every other week for 6 weeks. 60 g 0   oxyCODONE -acetaminophen  (PERCOCET) 5-325 MG tablet Take 1-2 tablets by mouth every 6 (six) hours as needed for severe pain (pain score 7-10). Max 6 tabs per day 30 tablet 0   polyethylene glycol powder (GLYCOLAX /MIRALAX ) 17 GM/SCOOP powder Take 17 g by mouth 2 (two) times daily. Mix as directed. (Patient taking  differently: Take 17 g by mouth 2 (two) times daily as needed for mild constipation. Mix as directed.) 238 g 0   Vitamin D , Ergocalciferol , (DRISDOL ) 1.25 MG (50000 UNIT) CAPS capsule Take 1 capsule (50,000 Units total) by mouth every 7 (seven) days. (Patient taking differently: Take 50,000 Units by mouth every Tuesday.) 12 capsule 0   Accu-Chek Softclix Lancets lancets Use to check blood sugar 3 times daily. 100 each 6   Blood Glucose Monitoring Suppl (ACCU-CHEK GUIDE) w/Device KIT Use to check blood sugar 3 times daily. 1 kit 0   Continuous Blood Gluc Transmit (DEXCOM G6 TRANSMITTER) MISC Use to check blood sugar three times daily. Change transmitter once every 909 days. E11.69 (Patient not taking: Reported on 06/05/2024) 1 each 1   doxycycline  (VIBRA -TABS) 100 MG tablet Take 1 tablet (100 mg total) by mouth 2 (two) times daily. (Patient  not taking: Reported on 06/05/2024) 28 tablet 0   glucose blood (ACCU-CHEK GUIDE TEST) test strip Use to check blood sugar 3 times daily. 100 each 6   Insulin  Pen Needle 32G X 4 MM MISC Use as directed to inject insulin  up to 4 times daily. 100 each 0   methocarbamol  (ROBAXIN ) 750 MG tablet Take 1 tablet (750 mg total) by mouth every 6 (six) hours as needed for muscle spasms. (Patient not taking: Reported on 06/27/2024) 45 tablet 0   nitroGLYCERIN  (NITRODUR - DOSED IN MG/24 HR) 0.2 mg/hr patch Place 1 patch (0.2 mg total) onto the skin daily. 30 patch 12    Physical Exam: General: Alert and oriented.  No apparent distress.  Vascular: DP/PT pulses palpable bilateral.  Neuro: Light touch sensation absent to bilateral lower extremities.  Derm: Right transmetatarsal amputation site appears to be well coapted with sutures/staples intact, no obvious dehiscence present today, reduction in erythema and edema present overall.  Mild maceration present to the medial aspect of the incisional area - improved from 8/1 examination - no active drainage, no obvious skin breakdown to  this area.  Left foot plantar subsecond and fifth metatarsals appears to have graft stapled in place, wounds appear to be stable at this time with graft intact.  No signs of infection present.  MSK: Right transmetatarsal amputation       Results for orders placed or performed during the hospital encounter of 06/26/24 (from the past 48 hours)  Glucose, capillary     Status: None   Collection Time: 07/08/24  7:34 AM  Result Value Ref Range   Glucose-Capillary 79 70 - 99 mg/dL    Comment: Glucose reference range applies only to samples taken after fasting for at least 8 hours.  Glucose, capillary     Status: Abnormal   Collection Time: 07/08/24  9:08 AM  Result Value Ref Range   Glucose-Capillary 118 (H) 70 - 99 mg/dL    Comment: Glucose reference range applies only to samples taken after fasting for at least 8 hours.  Glucose, capillary     Status: Abnormal   Collection Time: 07/08/24 12:06 PM  Result Value Ref Range   Glucose-Capillary 168 (H) 70 - 99 mg/dL    Comment: Glucose reference range applies only to samples taken after fasting for at least 8 hours.  Glucose, capillary     Status: Abnormal   Collection Time: 07/08/24  4:43 PM  Result Value Ref Range   Glucose-Capillary 143 (H) 70 - 99 mg/dL    Comment: Glucose reference range applies only to samples taken after fasting for at least 8 hours.  Glucose, capillary     Status: Abnormal   Collection Time: 07/08/24  8:20 PM  Result Value Ref Range   Glucose-Capillary 210 (H) 70 - 99 mg/dL    Comment: Glucose reference range applies only to samples taken after fasting for at least 8 hours.  Comprehensive metabolic panel with GFR     Status: Abnormal   Collection Time: 07/09/24  5:03 AM  Result Value Ref Range   Sodium 139 135 - 145 mmol/L   Potassium 4.3 3.5 - 5.1 mmol/L   Chloride 108 98 - 111 mmol/L   CO2 26 22 - 32 mmol/L   Glucose, Bld 145 (H) 70 - 99 mg/dL    Comment: Glucose reference range applies only to samples  taken after fasting for at least 8 hours.   BUN 26 (H) 6 - 20 mg/dL  Creatinine, Ser 1.24 0.61 - 1.24 mg/dL   Calcium  8.7 (L) 8.9 - 10.3 mg/dL   Total Protein 7.3 6.5 - 8.1 g/dL   Albumin 2.8 (L) 3.5 - 5.0 g/dL   AST 12 (L) 15 - 41 U/L   ALT 15 0 - 44 U/L   Alkaline Phosphatase 71 38 - 126 U/L   Total Bilirubin 0.6 0.0 - 1.2 mg/dL   GFR, Estimated >39 >39 mL/min    Comment: (NOTE) Calculated using the CKD-EPI Creatinine Equation (2021)    Anion gap 5 5 - 15    Comment: Performed at [facility], [address] [place], [place] [address]  CBC with Differential/Platelet     Status: Abnormal   Collection Time: 07/09/24  5:03 AM  Result Value Ref Range   WBC 7.0 4.0 - 10.5 K/uL   RBC 3.55 (L) 4.22 - 5.81 MIL/uL   Hemoglobin 8.7 (L) 13.0 - 17.0 g/dL   HCT 71.3 (L) 60.9 - 47.9 %   MCV 80.6 80.0 - 100.0 fL   MCH 24.5 (L) 26.0 - 34.0 pg   MCHC 30.4 30.0 - 36.0 g/dL   RDW 84.5 88.4 - 84.4 %   Platelets 450 (H) 150 - 400 K/uL   nRBC 0.0 0.0 - 0.2 %   Neutrophils Relative % 47 %   Neutro Abs 3.4 1.7 - 7.7 K/uL   Lymphocytes Relative 42 %   Lymphs Abs 3.0 0.7 - 4.0 K/uL   Monocytes Relative 8 %   Monocytes Absolute 0.5 0.1 - 1.0 K/uL   Eosinophils Relative 2 %   Eosinophils Absolute 0.2 0.0 - 0.5 K/uL   Basophils Relative 0 %   Basophils Absolute 0.0 0.0 - 0.1 K/uL   Immature Granulocytes 1 %   Abs Immature Granulocytes 0.04 0.00 - 0.07 K/uL    Comment: Performed at [facility], [address] [place], [place] [address]  Glucose, capillary     Status: Abnormal   Collection Time: 07/09/24  7:39 AM  Result Value Ref Range   Glucose-Capillary 136 (H) 70 - 99 mg/dL    Comment: Glucose reference range applies only to samples taken after fasting for at least 8 hours.  Glucose, capillary     Status: Abnormal   Collection Time: 07/09/24 11:46 AM  Result Value Ref Range   Glucose-Capillary 130 (H) 70 - 99 mg/dL    Comment: Glucose reference range applies only to  samples taken after fasting for at least 8 hours.  Glucose, capillary     Status: Abnormal   Collection Time: 07/09/24  4:21 PM  Result Value Ref Range   Glucose-Capillary 135 (H) 70 - 99 mg/dL    Comment: Glucose reference range applies only to samples taken after fasting for at least 8 hours.  Glucose, capillary     Status: Abnormal   Collection Time: 07/09/24  8:10 PM  Result Value Ref Range   Glucose-Capillary 162 (H) 70 - 99 mg/dL    Comment: Glucose reference range applies only to samples taken after fasting for at least 8 hours.   No results found.   Blood pressure 134/78, pulse 87, temperature 98.3 F (36.8 C), temperature source Oral, resp. rate 19, height 5' 10 (1.778 m), weight 102.1 kg, SpO2 100%.  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 Diabetes type 2 with  neuropathy  Plan - Patient seen and examined - Postoperative x-ray imaging and MRI imaging of left foot reviewed and discussed with patient in detail.  Discussed with patient that left foot appears to have chronic changes present which may be related to the chronic dislocation of digits at the metatarsal phalangeal joint levels.  Both wounds present to the left foot and surgery did not have any bone exposed at this time.  No signs of infection were present to the left foot overall. -Incision to the right foot appears to be well coapted with sutures/staples intact with no obvious evidence of infection present today.  Path negative.  Appreciate medicine/ID recommendations for antibiotic therapy. - Believe that the wounds to the left foot are purely pressure related to patient's increased plantar pressure from toe dislocations.  Discussed long-term if wounds do not heal to the left foot could consider  transmetatarsal amputation on this side as well or consider pan metatarsal head resections with gastroc or Achilles lengthening.  Overall patient is a poor surgical candidate due to history of uncontrolled diabetes with neuropathy. - Redressed right foot today with Xeroform to the incision line and graft sites followed by 4 x 4 gauze, ABD, Kerlix, Ace wrap. Both dressings can be left clean and dry for the next week. - Continue nonweightbearing at all times to bilateral lower extremities.  Stressed importance.  May use heel for transfers only in postop shoes but should not be putting full pressure on the feet. - Discussed with patient would recommend skilled nursing rehab as if patient goes home and he is likely to walk on his feet and end up with postoperative complications at further.  This is already been proven as he has been through surgery before and put weight on this foot which is likely caused his wounds to not heal as well.  Patient has multifactorial reasons as to why the wound would not heal and this is not the only 1.  Still recommend SNF if possible.  Podiatry team to sign off at this time.   Would like to see again in 1 week.  Greig [name], DPM 07/09/2024

## 2024-07-10 ENCOUNTER — Ambulatory Visit (HOSPITAL_BASED_OUTPATIENT_CLINIC_OR_DEPARTMENT_OTHER): Admitting: Internal Medicine

## 2024-07-10 ENCOUNTER — Other Ambulatory Visit (HOSPITAL_COMMUNITY): Payer: Self-pay

## 2024-07-10 DIAGNOSIS — B9620 Unspecified Escherichia coli [E. coli] as the cause of diseases classified elsewhere: Secondary | ICD-10-CM

## 2024-07-10 DIAGNOSIS — M86171 Other acute osteomyelitis, right ankle and foot: Secondary | ICD-10-CM | POA: Diagnosis not present

## 2024-07-10 LAB — BASIC METABOLIC PANEL WITH GFR
Anion gap: 5 (ref 5–15)
BUN: 24 mg/dL — ABNORMAL HIGH (ref 6–20)
CO2: 25 mmol/L (ref 22–32)
Calcium: 8.7 mg/dL — ABNORMAL LOW (ref 8.9–10.3)
Chloride: 108 mmol/L (ref 98–111)
Creatinine, Ser: 1.35 mg/dL — ABNORMAL HIGH (ref 0.61–1.24)
GFR, Estimated: >60 mL/min (ref >60)
Glucose, Bld: 215 mg/dL — ABNORMAL HIGH (ref 70–99)
Potassium: 4.3 mmol/L (ref 3.5–5.1)
Sodium: 138 mmol/L (ref 135–145)

## 2024-07-10 LAB — HEMOGLOBIN A1C
Hgb A1c MFr Bld: 8.7 % — ABNORMAL HIGH (ref 4.8–5.6)
Mean Plasma Glucose: 203 mg/dL

## 2024-07-10 LAB — GLUCOSE, CAPILLARY
Glucose-Capillary: 156 mg/dL — ABNORMAL HIGH (ref 70–99)
Glucose-Capillary: 160 mg/dL — ABNORMAL HIGH (ref 70–99)
Glucose-Capillary: 117 mg/dL — ABNORMAL HIGH (ref 70–99)
Glucose-Capillary: 144 mg/dL — ABNORMAL HIGH (ref 70–99)

## 2024-07-10 LAB — CBC
HCT: 27.3 % — ABNORMAL LOW (ref 39.0–52.0)
Hemoglobin: 8.5 g/dL — ABNORMAL LOW (ref 13.0–17.0)
MCH: 25 pg — ABNORMAL LOW (ref 26.0–34.0)
MCHC: 31.1 g/dL (ref 30.0–36.0)
MCV: 80.3 fL (ref 80.0–100.0)
Platelets: 402 K/uL — ABNORMAL HIGH (ref 150–400)
RBC: 3.4 MIL/uL — ABNORMAL LOW (ref 4.22–5.81)
RDW: 15.5 % (ref 11.5–15.5)
WBC: 6.9 K/uL (ref 4.0–10.5)
nRBC: 0 % (ref 0.0–0.2)

## 2024-07-10 NOTE — Progress Notes (Signed)
 Date of Admission:  06/26/2024     ID: [name] is a 58 y.o. male  Principal Problem:   Acute osteomyelitis of right foot (HCC) Active Problems:   MRSA bacteremia   Essential hypertension   Dyslipidemia   Uncontrolled type 2 diabetes mellitus with hyperglycemia, with long-term current use of insulin  (HCC)   Wound infection   Endocarditis    Subjective: Waiting to go to SNF  Medications:   ascorbic acid   500 mg Oral Daily   cyanocobalamin   1,000 mcg Oral Daily   enoxaparin  (LOVENOX ) injection  0.5 mg/kg Subcutaneous Q24H   folic acid   1 mg Oral Daily   gabapentin   300 mg Oral BID   insulin  aspart  0-15 Units Subcutaneous TID WC   insulin  aspart  0-5 Units Subcutaneous QHS   insulin  aspart  3 Units Subcutaneous TID WC   insulin  glargine-yfgn  25 Units Subcutaneous BID   iron  polysaccharides  150 mg Oral Daily   losartan   25 mg Oral Daily   mupirocin  ointment  1 Application Nasal BID   Vitamin D  (Ergocalciferol )  50,000 Units Oral Q Tue    Objective: Vital signs in last 24 hours: Patient Vitals for the past 24 hrs:  BP Temp Temp src Pulse Resp SpO2  07/10/24 1501 135/81 97.9 F (36.6 C) Oral 85 16 100 %  07/10/24 0732 115/73 98 F (36.7 C) Oral 81 16 100 %  07/10/24 0328 108/60 98.4 F (36.9 C) Oral 87 19 99 %  07/09/24 1935 134/78 98.3 F (36.8 C) Oral 87 19 100 %     Lines and Device Date on insertion # of days DC  Engineer, technical sales     ETT       PHYSICAL EXAM:  General: Alert, cooperative, no distress, appears stated age.  Lungs: Clear to auscultation bilaterally. No Wheezing or Rhonchi. No rales. Heart: Regular rate and rhythm, no murmur, rub or gallop. Abdomen: Soft, non-tender,not distended. Bowel sounds normal. No masses Extremities:rt foot 07/09/24    Left foot picture reviewed  Skin: No rashes or lesions. Or bruising Lymph: Cervical, supraclavicular normal. Neurologic: Grossly non-focal  Lab Results    Latest Ref  Rng & Units 07/10/2024    5:25 AM 07/09/2024    5:03 AM 06/30/2024    4:22 AM  CBC  WBC 4.0 - 10.5 K/uL 6.9  7.0  13.4   Hemoglobin 13.0 - 17.0 g/dL 8.5  8.7  8.1   Hematocrit 39.0 - 52.0 % 27.3  28.6  25.7   Platelets 150 - 400 K/uL 402  450  472        Latest Ref Rng & Units 07/10/2024    5:25 AM 07/09/2024    5:03 AM 07/02/2024   12:48 PM  CMP  Glucose 70 - 99 mg/dL 784  854  74   BUN 6 - 20 mg/dL 24  26  19    Creatinine 0.61 - 1.24 mg/dL 8.64  8.75  8.85   Sodium 135 - 145 mmol/L 138  139  138   Potassium 3.5 - 5.1 mmol/L 4.3  4.3  4.2   Chloride 98 - 111 mmol/L 108  108  104   CO2 22 - 32 mmol/L 25  26  28    Calcium  8.9 - 10.3 mg/dL 8.7  8.7  8.8   Total Protein 6.5 - 8.1 g/dL  7.3    Total Bilirubin 0.0 - 1.2 mg/dL  0.6  Alkaline Phos 38 - 126 U/L  71    AST 15 - 41 U/L  12    ALT 0 - 44 U/L  15        Microbiology: [facility]- 06/26/24 MRSA 7/24 Superficial wound culture MRSA, ESBl kleb 06/29/24 Tissue culture sent during surgery pending  Pathology reactive cortical and trabecular bone with no acute inflammation    Assessment/Plan:  MRSA bacteremia- secondary to rt foot infection recurrent 2 d echo N TEE Neg Repeat blood culture sent-  on 7/28 neg except for a gram pos identified after 60 hrs- which is  a contaminant- nothing to do about that On daptomycin  / ertapenem   Will need both for 6 weeks IV       Infection rt TMA-  polymicrobial ESBL klebsiella, MRSA and dipheroids in superficial culture -underlying  osteomyelitis On meropenem , can change to ertapenem  for total of 6 weeks s/p revision TMA t Surgical bone culture Ecoli  bone pathology no acute osteomyelitis    DM   Neuropathy     Lft foot ulcer chronic- had graft placement on 7/27     Pt needs to go to SNF to get IV and stay off his feet so that both his feet wounds can heal.  Discussed the management with patient and Dr.Ayiku OPAT note placed Place PICC line once discharge date is  finalized Follow up with me as OP  on 07/24/24  OPAt orders entered Expand All Collapse All  Diagnosis: MRSA bacteremia and Diabetic foot infection rt Polymicrobial infection i Baseline Creatinine <1       Allergies  No Known Allergies     OPAT Orders Discharge antibiotics: Daptomycin  800mg  IV every 24 hours Duration: 6 weeks total  And  Ertapenem  1 gram Iv every 24 hours 6 weeks total End Date: 08/06/24     [facility] Care Per Protocol:   Labs weekly while on IV antibiotics: _X_ CBC with differential _X_ CK _X_ CMP _X_ CRP _X_ ESR     X__ Please pull PIC at completion of IV antibiotics     Discussed the management with the patient ID will sign off- call if needed

## 2024-07-10 NOTE — Plan of Care (Signed)

## 2024-07-10 NOTE — Progress Notes (Addendum)
 PROGRESS NOTE    [name]  FMW:[id number] DOB: 09/11/1966 DOA: 06/26/2024 PCP: [name], MD   Assessment & Plan:   Principal Problem:   Acute osteomyelitis of right foot (HCC) Active Problems:   MRSA bacteremia   Uncontrolled type 2 diabetes mellitus with hyperglycemia, with long-term current use of insulin  (HCC)   Essential hypertension   Dyslipidemia   Wound infection   Endocarditis  Assessment and Plan: Acute b/l foot infection/cellulitis: s/p right revision transmetatarsal amputation, left foot subcutaneous wound debridement with biopsy, left foot wound graft placement on 06/29/2024.  Surgical wound culture showed E. coli that is pansensitive. No evidence of osteomyelitis on path. Continue on IV dapto, ertapenem    MRSA bacteremia:s/p TEE on 07/02/2024 which was negative for vegetations.  Preserved EF. Repeat blood culture from 06/30/2024-1 out of 4 bottles grew gram-positive rods but this is likely a contaminant.  Species could not be identified by the local lab.  Continue on IV dapto & ertapenem  through 08/06/24 as per ID. Needs PICC prior to d/c    DM2: likely poorly controlled. Continue on glargine, aspart, SSI w/ accuchecks   CKDIIIa: Cr is labile. Avoid nephrotoxic meds  ACD: likely secondary to CKD. Will transfuse if Hb < 7.0    HTN: continue on home dose of losartan    HLD: continue on statin   Thrombocytosis: etiology unclear, likely from above infection. Will continue to monitor         DVT prophylaxis: lovenox  Code Status: full Family Communication:  Disposition Plan: needs SNF placement  Level of care: Med-Surg  Status is: Inpatient Remains inpatient appropriate because: needs SNF placement still, CM is working on this    Consultants:    Procedures:  Antimicrobials: dapto, ertapenem    Subjective: Pt c/o fatigue   Objective: Vitals:   07/09/24 1451 07/09/24 1935 07/10/24 0328 07/10/24 0732  BP: (!) 141/76 134/78 108/60 115/73  Pulse:  82 87 87 81  Resp: 17 19 19 16   Temp: 98.1 F (36.7 C) 98.3 F (36.8 C) 98.4 F (36.9 C) 98 F (36.7 C)  TempSrc: Oral Oral Oral Oral  SpO2: 98% 100% 99% 100%  Weight:      Height:        Intake/Output Summary (Last 24 hours) at 07/10/2024 0947 Last data filed at 07/10/2024 0859 Gross per 24 hour  Intake 480 ml  Output --  Net 480 ml   Filed Weights   06/26/24 1335  Weight: 102.1 kg    Examination:  General exam: appears comfortable  Respiratory system: clear breath sounds b/l  Cardiovascular system: S1/S2+. No rubs or clicks   Gastrointestinal system: abd is soft, NT, ND & hypoactive bowel sounds  Central nervous system: alert & oriented. Moves all extremities  Psychiatry: judgement and insight appears normal. Appropriate mood and affect    Data Reviewed: I have personally reviewed following labs and imaging studies  CBC: Recent Labs  Lab 07/09/24 0503 07/10/24 0525  WBC 7.0 6.9  NEUTROABS 3.4  --   HGB 8.7* 8.5*  HCT 28.6* 27.3*  MCV 80.6 80.3  PLT 450* 402*   Basic Metabolic Panel: Recent Labs  Lab 07/09/24 0503 07/10/24 0525  NA 139 138  K 4.3 4.3  CL 108 108  CO2 26 25  GLUCOSE 145* 215*  BUN 26* 24*  CREATININE 1.24 1.35*  CALCIUM  8.7* 8.7*   GFR: Estimated Creatinine Clearance: 72.2 mL/min (A) (by C-G formula based on SCr of 1.35 mg/dL (H)). Liver Function Tests: Recent  Labs  Lab 07/09/24 0503  AST 12*  ALT 15  ALKPHOS 71  BILITOT 0.6  PROT 7.3  ALBUMIN 2.8*   No results for input(s): LIPASE, AMYLASE in the last 168 hours. No results for input(s): AMMONIA in the last 168 hours. Coagulation Profile: No results for input(s): INR, PROTIME in the last 168 hours. Cardiac Enzymes: Recent Labs  Lab 07/05/24 0401  CKTOTAL 63   BNP (last 3 results) No results for input(s): PROBNP in the last 8760 hours. HbA1C: Recent Labs    07/09/24 0913  HGBA1C 8.7*   CBG: Recent Labs  Lab 07/09/24 0739 07/09/24 1146  07/09/24 1621 07/09/24 2010 07/10/24 0735  GLUCAP 136* 130* 135* 162* 156*   Lipid Profile: No results for input(s): CHOL, HDL, LDLCALC, TRIG, CHOLHDL, LDLDIRECT in the last 72 hours. Thyroid  Function Tests: No results for input(s): TSH, T4TOTAL, FREET4, T3FREE, THYROIDAB in the last 72 hours. Anemia Panel: No results for input(s): VITAMINB12, FOLATE, FERRITIN, TIBC, IRON , RETICCTPCT in the last 72 hours. Sepsis Labs: No results for input(s): PROCALCITON, LATICACIDVEN in the last 168 hours.  Recent Results (from the past 240 hours)  Culture, blood (Routine X 2) w Reflex to ID Panel     Status: None   Collection Time: 06/30/24  5:28 PM   Specimen: BLOOD  Result Value Ref Range Status   Specimen Description   Final    BLOOD BLOOD RIGHT ARM Performed at [facility], [address], [place], [place] [address]    Special Requests   Final    BOTTLES DRAWN AEROBIC AND ANAEROBIC Blood Culture adequate volume Performed at [facility], [address] [place], [place] [address]    Culture  Setup Time   Final    GRAM POSITIVE RODS AEROBIC BOTTLE ONLY CRITICAL RESULT CALLED TO, READ BACK BY AND VERIFIED WITH: NATHAN PARK PHARMD [id number] 07/03/24 HNM GRAM STAIN REVIEWED-AGREE WITH RESULT DRT    Culture   Final    GRAM POSITIVE RODS CALL MICROBIOLOGY LAB IF SENSITIVITIES ARE REQUIRED. Performed at [facility], [address] [place], [place] [address]    Report Status 07/06/2024 FINAL  Final  Culture, blood (Routine X 2) w Reflex to ID Panel     Status: None   Collection Time: 06/30/24  5:33 PM   Specimen: BLOOD  Result Value Ref Range Status   Specimen Description BLOOD BLOOD LEFT ARM  Final   Special Requests   Final    BOTTLES DRAWN AEROBIC AND ANAEROBIC Blood Culture adequate volume   Culture   Final    NO GROWTH 5 DAYS Performed at [facility], [address], [place], [place] [address]    Report  Status 07/05/2024 FINAL  Final         Radiology Studies: No results found.      Scheduled Meds:  ascorbic acid   500 mg Oral Daily   cyanocobalamin   1,000 mcg Oral Daily   enoxaparin  (LOVENOX ) injection  0.5 mg/kg Subcutaneous Q24H   folic acid   1 mg Oral Daily   gabapentin   300 mg Oral BID   insulin  aspart  0-15 Units Subcutaneous TID WC   insulin  aspart  0-5 Units Subcutaneous QHS   insulin  aspart  3 Units Subcutaneous TID WC   insulin  glargine-yfgn  25 Units Subcutaneous BID   iron  polysaccharides  150 mg Oral Daily   losartan   25 mg Oral Daily   mupirocin  ointment  1 Application Nasal BID  Vitamin D  (Ergocalciferol )  50,000 Units Oral Q Tue   Continuous Infusions:  DAPTOmycin  800 mg (07/09/24 1246)   ertapenem  1 g (07/10/24 0609)     LOS: 14 days      Anthony CHRISTELLA Pouch, MD [facility] Pager [phone]  If 7PM-7AM, please contact night-coverage 07/10/2024, 9:47 AM

## 2024-07-11 ENCOUNTER — Other Ambulatory Visit: Payer: Self-pay

## 2024-07-11 DIAGNOSIS — M86171 Other acute osteomyelitis, right ankle and foot: Secondary | ICD-10-CM | POA: Diagnosis not present

## 2024-07-11 LAB — CK: Total CK: 220 U/L (ref 49–397)

## 2024-07-11 LAB — BASIC METABOLIC PANEL WITH GFR
Anion gap: 8 (ref 5–15)
BUN: 27 mg/dL — ABNORMAL HIGH (ref 6–20)
CO2: 25 mmol/L (ref 22–32)
Calcium: 8.6 mg/dL — ABNORMAL LOW (ref 8.9–10.3)
Chloride: 104 mmol/L (ref 98–111)
Creatinine, Ser: 1.36 mg/dL — ABNORMAL HIGH (ref 0.61–1.24)
GFR, Estimated: >60 mL/min (ref >60)
Glucose, Bld: 181 mg/dL — ABNORMAL HIGH (ref 70–99)
Potassium: 4.1 mmol/L (ref 3.5–5.1)
Sodium: 137 mmol/L (ref 135–145)

## 2024-07-11 LAB — CBC
HCT: 27 % — ABNORMAL LOW (ref 39.0–52.0)
Hemoglobin: 8.1 g/dL — ABNORMAL LOW (ref 13.0–17.0)
MCH: 24.4 pg — ABNORMAL LOW (ref 26.0–34.0)
MCHC: 30 g/dL (ref 30.0–36.0)
MCV: 81.3 fL (ref 80.0–100.0)
Platelets: 397 K/uL (ref 150–400)
RBC: 3.32 MIL/uL — ABNORMAL LOW (ref 4.22–5.81)
RDW: 15.6 % — ABNORMAL HIGH (ref 11.5–15.5)
WBC: 7.7 K/uL (ref 4.0–10.5)
nRBC: 0 % (ref 0.0–0.2)

## 2024-07-11 LAB — GLUCOSE, CAPILLARY
Glucose-Capillary: 137 mg/dL — ABNORMAL HIGH (ref 70–99)
Glucose-Capillary: 92 mg/dL (ref 70–99)
Glucose-Capillary: 98 mg/dL (ref 70–99)
Glucose-Capillary: 147 mg/dL — ABNORMAL HIGH (ref 70–99)

## 2024-07-11 MED ORDER — SODIUM CHLORIDE 0.9% FLUSH
10.0000 mL | Freq: Two times a day (BID) | INTRAVENOUS | Status: DC
  Administered 2024-07-11 – 2024-07-12 (×4): 10 mL
  Administered 2024-07-13: 20 mL
  Administered 2024-07-13 – 2024-07-14 (×3): 10 mL

## 2024-07-11 MED ORDER — CHLORHEXIDINE GLUCONATE CLOTH 2 % EX PADS
6.0000 | MEDICATED_PAD | Freq: Every day | CUTANEOUS | Status: DC
  Administered 2024-07-11 – 2024-07-14 (×5): 6 via TOPICAL

## 2024-07-11 MED ORDER — SODIUM CHLORIDE 0.9% FLUSH
10.0000 mL | INTRAVENOUS | Status: DC | PRN
  Administered 2024-07-11: 10 mL

## 2024-07-11 NOTE — Progress Notes (Signed)
 Physical Therapy Treatment Patient Details Name: [name] MRN: [id number] DOB: July 30, 1966 Today's Date: 07/11/2024   History of Present Illness Pt is a 58 y.o. who presented with redness, painn swelling, and discharge from the previous R TMA site on 06/26/24 and is now s/p revision TMA and debridement of chronic L foot ulcers with skin graft on 7/27. Per surgical note Pt is to be NWB for the most part at all times, but may apply a little bit of weight to his heels for transfers, but not to the balls of the feet. PMH includes: DM, DFI, MRSA bacteremia and MRSA foot infection, R TMA, left foot chronic wound.    PT Comments  Pt continues to be A and O x 4. Agreeable to session.  Ive been in w/c and all around unit today already. Author had pt perform transfer in/out of w/c. Required vcs only for improved technique and safety> reviewed and perform previously issued HEP exercises for BUE/BLE strengthening/increase circulation to promote healing. Pt will require 6 wk of IV antibiotics. He lives alone and will benefit from the continued skilled PT to maximize safety and independence with all ADLs.    If plan is discharge home, recommend the following: Assistance with cooking/housework;Assist for transportation;A little help with walking and/or transfers;A little help with bathing/dressing/bathroom;Help with stairs or ramp for entrance     Equipment Recommendations  Other (comment) (Defer to next level of care)       Precautions / Restrictions Precautions Precautions: Fall Recall of Precautions/Restrictions: Intact Restrictions Weight Bearing Restrictions Per Provider Order: Yes RLE Weight Bearing Per Provider Order: Non weight bearing LLE Weight Bearing Per Provider Order: Non weight bearing Other Position/Activity Restrictions: per podiatrist pt is allowed heel WB for transfers only in post op shoes, no walking or WB through balls of the feet     Mobility  Bed Mobility Overal bed mobility:  Independent    Transfers Overall transfer level: Needs assistance Equipment used: Rolling walker (2 wheels) Transfers: Bed to chair/wheelchair/BSC Sit to Stand: Supervision Stand pivot transfers: Supervision  General transfer comment: pt continues to demonstrate safe abilities to stand pivot in/out of w/c with vcs only for improved safety and technique    Ambulation/Gait  General Gait Details: unable due to NWB. Did self propel w/c > 531ft  Wheelchair Mobility Wheelchair Mobility Wheelchair mobility: Yes Wheelchair propulsion: Both upper extremities Wheelchair parts: Independent Distance: > 58ft          Communication Communication Communication: No apparent difficulties  Cognition Arousal: Alert Behavior During Therapy: WFL for tasks assessed/performed   PT - Cognitive impairments: No apparent impairments    PT - Cognition Comments: pleasant and agreeable to session Following commands: Intact      Cueing Cueing Techniques: Verbal cues     General Comments General comments (skin integrity, edema, etc.): Author reviewed BUE ther ex and BLE ther ex he can perform to promote strengthing and circulation. pt demonstrated abilities to perform without physical assistance      Pertinent Vitals/Pain Pain Assessment Pain Assessment: 0-10 Pain Score: 4            PT Goals (current goals can now be found in the care plan section) Acute Rehab PT Goals Patient Stated Goal: To go to rehab and recover Progress towards PT goals: Progressing toward goals    Frequency    Min 1X/week       AM-PAC PT 6 Clicks Mobility   Outcome Measure  Help needed turning from your  back to your side while in a flat bed without using bedrails?: None Help needed moving from lying on your back to sitting on the side of a flat bed without using bedrails?: None Help needed moving to and from a bed to a chair (including a wheelchair)?: None Help needed standing up from a chair using  your arms (e.g., wheelchair or bedside chair)?: A Little Help needed to walk in hospital room?: Total Help needed climbing 3-5 steps with a railing? : Total 6 Click Score: 17    End of Session Equipment Utilized During Treatment: Other (comment) (w/c) Activity Tolerance: Patient tolerated treatment well Patient left: in bed;with call bell/phone within reach Nurse Communication: Mobility status PT Visit Diagnosis: Pain;Other abnormalities of gait and mobility (R26.89)     Time: 1510-1520 PT Time Calculation (min) (ACUTE ONLY): 10 min  Charges:    $Therapeutic Activity: 8-22 mins PT General Charges $$ ACUTE PT VISIT: 1 Visit                    Rankin Essex PTA 07/11/24, 3:25 PM

## 2024-07-11 NOTE — TOC Progression Note (Signed)
 Transition of Care [facility]) - Progression Note    Patient Details  Name: [name] MRN: [id number] Date of Birth: [birth year]  Transition of [facility] CM/SW Contact  Alvaro Louder, [place] Phone Number: 07/11/2024, 9:27 AM  Clinical Narrative:   [facility] reached out to [facility] Admissions coordinator Brittney and SNF will start Auth. Plan on patient to DC Monday.   TOC to follow for DC                      Expected Discharge Plan and Services                                               Social Drivers of Health (SDOH) Interventions SDOH Screenings   Food Insecurity: No Food Insecurity (06/26/2024)  Housing: Low Risk  (06/26/2024)  Transportation Needs: No Transportation Needs (06/26/2024)  Utilities: Not At Risk (06/26/2024)  Depression (PHQ2-9): Low Risk  (08/28/2023)  Financial Resource Strain: Patient Declined (06/28/2023)   Received from [place] [facility]  Social Connections: Moderately Integrated (06/26/2024)  Tobacco Use: Medium Risk (06/26/2024)  Health Literacy: Adequate Health Literacy (05/01/2024)    Readmission Risk Interventions     No data to display

## 2024-07-11 NOTE — Progress Notes (Signed)
 Peripherally Inserted Central Catheter Placement  The IV Nurse has discussed with the patient and/or persons authorized to consent for the patient, the purpose of this procedure and the potential benefits and risks involved with this procedure.  The benefits include less needle sticks, lab draws from the catheter, and the patient may be discharged home with the catheter. Risks include, but not limited to, infection, bleeding, blood clot (thrombus formation), and puncture of an artery; nerve damage and irregular heartbeat and possibility to perform a PICC exchange if needed/ordered by physician.  Alternatives to this procedure were also discussed.  Bard Power PICC patient education guide, fact sheet on infection prevention and patient information card has been provided to patient /or left at bedside.    PICC Placement Documentation  PICC Single Lumen 07/11/24 Right Brachial 41 cm 1 cm (Active)  Indication for Insertion or Continuance of Line Prolonged intravenous therapies 07/11/24 1225  Exposed Catheter (cm) 1 cm 07/11/24 1225  Site Assessment Clean, Dry, Intact 07/11/24 1225  Line Status Flushed;Blood return noted;Saline locked 07/11/24 1225  Dressing Type Transparent 07/11/24 1225  Dressing Status Antimicrobial disc/dressing in place 07/11/24 1225  Line Care Connections checked and tightened 07/11/24 1225  Line Adjustment (NICU/IV Team Only) No 07/11/24 1225  Dressing Intervention New dressing 07/11/24 1225  Dressing Change Due 07/18/24 07/11/24 1225       [name] 07/11/2024, 12:44 PM

## 2024-07-11 NOTE — Plan of Care (Signed)
  Problem: Education: Goal: Knowledge of General Education information will improve Description: Including pain rating scale, medication(s)/side effects and non-pharmacologic comfort measures 07/11/2024 1601 by Cristopher Nivia BIRCH, RN Outcome: Progressing 07/11/2024 1601 by Cristopher Nivia BIRCH, RN Outcome: Progressing   Problem: Activity: Goal: Risk for activity intolerance will decrease 07/11/2024 1601 by Cristopher Nivia BIRCH, RN Outcome: Progressing 07/11/2024 1601 by Cristopher Nivia BIRCH, RN Outcome: Progressing   Problem: Nutrition: Goal: Adequate nutrition will be maintained 07/11/2024 1601 by Cristopher Nivia BIRCH, RN Outcome: Progressing 07/11/2024 1601 by Cristopher Nivia BIRCH, RN Outcome: Progressing   Problem: Coping: Goal: Level of anxiety will decrease 07/11/2024 1601 by Cristopher Nivia BIRCH, RN Outcome: Progressing 07/11/2024 1601 by Cristopher Nivia BIRCH, RN Outcome: Progressing

## 2024-07-11 NOTE — Progress Notes (Signed)
 PROGRESS NOTE    [name]  FMW:[id number] DOB: 03/30/1966 DOA: 06/26/2024 PCP: [name], MD   Assessment & Plan:   Principal Problem:   Acute osteomyelitis of right foot (HCC) Active Problems:   MRSA bacteremia   Uncontrolled type 2 diabetes mellitus with hyperglycemia, with long-term current use of insulin  (HCC)   Essential hypertension   Dyslipidemia   Wound infection   Endocarditis  Assessment and Plan: Acute b/l foot infection/cellulitis: s/p right revision transmetatarsal amputation, left foot subcutaneous wound debridement with biopsy, left foot wound graft placement on 06/29/2024.  Surgical wound culture showed E. coli that is pansensitive. No evidence of osteomyelitis on path. Continue on IV ertapenem , dapto as per ID. Will get PICC today   MRSA bacteremia:s/p TEE on 07/02/2024 which was negative for vegetations.  Preserved EF. Repeat blood culture from 06/30/2024-1 out of 4 bottles grew gram-positive rods but this is likely a contaminant.  Species could not be identified by the local lab. Continue on IV ertapenem , dapto until 08/06/24 as per ID. Will get PICC today   DM2: likely poorly controlled. Continue on glargine, aspart, SSI w/ accuchecks  CKDIIIa: Cr is labile. Avoid nephrotoxic meds   ACD: likely secondary to CKD. No need for a transfusion currently    HTN: continue on home dose of losartan    HLD: continue on statin   Thrombocytosis: WNL today         DVT prophylaxis: lovenox  Code Status: full Family Communication:  Disposition Plan: SNF bed not available until 07/14/24 as per CM   Level of care: Med-Surg  Status is: Inpatient Remains inpatient appropriate because: SNF bed not available until 07/14/24 as per CM    Consultants:    Procedures:  Antimicrobials: dapto, ertapenem    Subjective: Pt c/o malaise   Objective: Vitals:   07/10/24 1501 07/10/24 2025 07/11/24 0226 07/11/24 0740  BP: 135/81 100/82 134/63 118/70  Pulse: 85 84 92  88  Resp: 16 16  18   Temp: 97.9 F (36.6 C) 97.8 F (36.6 C) 97.8 F (36.6 C) 97.8 F (36.6 C)  TempSrc: Oral Oral    SpO2: 100% 100% 99% 100%  Weight:      Height:        Intake/Output Summary (Last 24 hours) at 07/11/2024 0916 Last data filed at 07/10/2024 1900 Gross per 24 hour  Intake 480 ml  Output 200 ml  Net 280 ml   Filed Weights   06/26/24 1335  Weight: 102.1 kg    Examination:  General exam: appears calm & comfortable  Respiratory system: clear breath sounds b/l Cardiovascular system: S1 & S2+. No rubs or clicks    Gastrointestinal system: abd is soft, NT, ND & hypoactive bowel sounds  Central nervous system: alert & oriented. Moves all extremities Psychiatry: judgement and insight appears normal. Flat mood and affect     Data Reviewed: I have personally reviewed following labs and imaging studies  CBC: Recent Labs  Lab 07/09/24 0503 07/10/24 0525 07/11/24 0453  WBC 7.0 6.9 7.7  NEUTROABS 3.4  --   --   HGB 8.7* 8.5* 8.1*  HCT 28.6* 27.3* 27.0*  MCV 80.6 80.3 81.3  PLT 450* 402* 397   Basic Metabolic Panel: Recent Labs  Lab 07/09/24 0503 07/10/24 0525 07/11/24 0453  NA 139 138 137  K 4.3 4.3 4.1  CL 108 108 104  CO2 26 25 25   GLUCOSE 145* 215* 181*  BUN 26* 24* 27*  CREATININE 1.24 1.35* 1.36*  CALCIUM   8.7* 8.7* 8.6*   GFR: Estimated Creatinine Clearance: 71.7 mL/min (A) (by C-G formula based on SCr of 1.36 mg/dL (H)). Liver Function Tests: Recent Labs  Lab 07/09/24 0503  AST 12*  ALT 15  ALKPHOS 71  BILITOT 0.6  PROT 7.3  ALBUMIN 2.8*   No results for input(s): LIPASE, AMYLASE in the last 168 hours. No results for input(s): AMMONIA in the last 168 hours. Coagulation Profile: No results for input(s): INR, PROTIME in the last 168 hours. Cardiac Enzymes: Recent Labs  Lab 07/05/24 0401 07/11/24 0453  CKTOTAL 63 220   BNP (last 3 results) No results for input(s): PROBNP in the last 8760 hours. HbA1C: Recent  Labs    07/09/24 0913  HGBA1C 8.7*   CBG: Recent Labs  Lab 07/10/24 0735 07/10/24 1140 07/10/24 1635 07/10/24 2132 07/11/24 0741  GLUCAP 156* 160* 117* 144* 137*   Lipid Profile: No results for input(s): CHOL, HDL, LDLCALC, TRIG, CHOLHDL, LDLDIRECT in the last 72 hours. Thyroid  Function Tests: No results for input(s): TSH, T4TOTAL, FREET4, T3FREE, THYROIDAB in the last 72 hours. Anemia Panel: No results for input(s): VITAMINB12, FOLATE, FERRITIN, TIBC, IRON , RETICCTPCT in the last 72 hours. Sepsis Labs: No results for input(s): PROCALCITON, LATICACIDVEN in the last 168 hours.  No results found for this or any previous visit (from the past 240 hours).        Radiology Studies: US  EKG SITE RITE Result Date: 07/11/2024 If Site Rite image not attached, placement could not be confirmed due to current cardiac rhythm.       Scheduled Meds:  ascorbic acid   500 mg Oral Daily   cyanocobalamin   1,000 mcg Oral Daily   enoxaparin  (LOVENOX ) injection  0.5 mg/kg Subcutaneous Q24H   folic acid   1 mg Oral Daily   gabapentin   300 mg Oral BID   insulin  aspart  0-15 Units Subcutaneous TID WC   insulin  aspart  0-5 Units Subcutaneous QHS   insulin  aspart  3 Units Subcutaneous TID WC   insulin  glargine-yfgn  25 Units Subcutaneous BID   iron  polysaccharides  150 mg Oral Daily   losartan   25 mg Oral Daily   mupirocin  ointment  1 Application Nasal BID   Vitamin D  (Ergocalciferol )  50,000 Units Oral Q Tue   Continuous Infusions:  DAPTOmycin  800 mg (07/10/24 1342)   ertapenem  1 g (07/11/24 0610)     LOS: 15 days      Anthony CHRISTELLA Pouch, MD [facility] Pager [phone]  If 7PM-7AM, please contact night-coverage 07/11/2024, 9:16 AM

## 2024-07-12 DIAGNOSIS — M86171 Other acute osteomyelitis, right ankle and foot: Secondary | ICD-10-CM | POA: Diagnosis not present

## 2024-07-12 DIAGNOSIS — B9562 Methicillin resistant Staphylococcus aureus infection as the cause of diseases classified elsewhere: Secondary | ICD-10-CM | POA: Diagnosis not present

## 2024-07-12 DIAGNOSIS — R7881 Bacteremia: Secondary | ICD-10-CM | POA: Diagnosis not present

## 2024-07-12 LAB — BASIC METABOLIC PANEL WITH GFR
Anion gap: 8 (ref 5–15)
BUN: 28 mg/dL — ABNORMAL HIGH (ref 6–20)
CO2: 24 mmol/L (ref 22–32)
Calcium: 8.6 mg/dL — ABNORMAL LOW (ref 8.9–10.3)
Chloride: 106 mmol/L (ref 98–111)
Creatinine, Ser: 1.25 mg/dL — ABNORMAL HIGH (ref 0.61–1.24)
GFR, Estimated: >60 mL/min (ref >60)
Glucose, Bld: 163 mg/dL — ABNORMAL HIGH (ref 70–99)
Potassium: 4.2 mmol/L (ref 3.5–5.1)
Sodium: 138 mmol/L (ref 135–145)

## 2024-07-12 LAB — CBC
HCT: 28.1 % — ABNORMAL LOW (ref 39.0–52.0)
Hemoglobin: 8.5 g/dL — ABNORMAL LOW (ref 13.0–17.0)
MCH: 24.3 pg — ABNORMAL LOW (ref 26.0–34.0)
MCHC: 30.2 g/dL (ref 30.0–36.0)
MCV: 80.3 fL (ref 80.0–100.0)
Platelets: 384 K/uL (ref 150–400)
RBC: 3.5 MIL/uL — ABNORMAL LOW (ref 4.22–5.81)
RDW: 15.5 % (ref 11.5–15.5)
WBC: 7.8 K/uL (ref 4.0–10.5)
nRBC: 0 % (ref 0.0–0.2)

## 2024-07-12 LAB — GLUCOSE, CAPILLARY
Glucose-Capillary: 95 mg/dL (ref 70–99)
Glucose-Capillary: 114 mg/dL — ABNORMAL HIGH (ref 70–99)
Glucose-Capillary: 93 mg/dL (ref 70–99)
Glucose-Capillary: 157 mg/dL — ABNORMAL HIGH (ref 70–99)

## 2024-07-12 NOTE — Plan of Care (Signed)

## 2024-07-12 NOTE — Plan of Care (Signed)

## 2024-07-12 NOTE — Progress Notes (Signed)
 PROGRESS NOTE    [name]  FMW:[id number] DOB: 10/24/1966 DOA: 06/26/2024 PCP: [name], MD   Assessment & Plan:   Principal Problem:   Acute osteomyelitis of right foot (HCC) Active Problems:   MRSA bacteremia   Uncontrolled type 2 diabetes mellitus with hyperglycemia, with long-term current use of insulin  (HCC)   Essential hypertension   Dyslipidemia   Wound infection   Endocarditis  Assessment and Plan: Acute b/l foot infection/cellulitis: s/p right revision transmetatarsal amputation, left foot subcutaneous wound debridement with biopsy, left foot wound graft placement on 06/29/2024.  Surgical wound culture showed E. coli that is pansensitive. No evidence of osteomyelitis on path. Continue on IV dapto, ertapenem  as per ID. S/p PICC on 07/11/24  MRSA bacteremia: s/p TEE on 07/02/2024 which was negative for vegetations.  Preserved EF. Repeat blood culture from 06/30/2024-1 out of 4 bottles grew gram-positive rods but this is likely a contaminant.  Species could not be identified by the local lab. Continue on IV dapto, ertapenem  until 08/06/24 as per ID. S/p PICC line on 07/11/24   DM2: likely poorly controlled. 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 Avoid nephrotoxic meds   ACD: likely secondary to CKD. H&H are labile    HTN: continue on home dose of losartan    HLD: continue on statin   Thrombocytosis: WNL today         DVT prophylaxis: lovenox  Code Status: full Family Communication:  Disposition Plan: SNF bed not available until 07/14/24 as per CM   Level of care: Med-Surg  Status is: Inpatient Remains inpatient appropriate because: SNF bed not available until 07/14/24 as per CM    Consultants:    Procedures:  Antimicrobials: dapto, ertapenem    Subjective: Pt c/o fatigue   Objective: Vitals:   07/11/24 1635 07/11/24 1952 07/12/24 0535 07/12/24 0800  BP: 126/80 135/88 130/61 137/86  Pulse: 89 85 85 84   Resp: 18 16 18 18   Temp: 97.6 F (36.4 C) (!) 97.5 F (36.4 C) 98 F (36.7 C) (!) 97.5 F (36.4 C)  TempSrc:      SpO2: 96% 100% 99% 100%  Weight:      Height:        Intake/Output Summary (Last 24 hours) at 07/12/2024 0923 Last data filed at 07/11/2024 1900 Gross per 24 hour  Intake 240 ml  Output --  Net 240 ml   Filed Weights   06/26/24 1335  Weight: 102.1 kg    Examination:  General exam: appears comfortable  Respiratory system: clear breath sounds b/l  Cardiovascular system: S1/S2+. No rubs or clicks     Gastrointestinal system: abd is soft, NT, ND & hypoactive bowel sounds  Central nervous system: alert & oriented. Moves all extremities  Psychiatry: judgement and insight appears normal. Appropriate mood and affect    Data Reviewed: I have personally reviewed following labs and imaging studies  CBC: Recent Labs  Lab 07/09/24 0503 07/10/24 0525 07/11/24 0453 07/12/24 0415  WBC 7.0 6.9 7.7 7.8  NEUTROABS 3.4  --   --   --   HGB 8.7* 8.5* 8.1* 8.5*  HCT 28.6* 27.3* 27.0* 28.1*  MCV 80.6 80.3 81.3 80.3  PLT 450* 402* 397 384   Basic Metabolic Panel: Recent Labs  Lab 07/09/24 0503 07/10/24 0525 07/11/24 0453 07/12/24 0415  NA 139 138 137 138  K 4.3 4.3 4.1 4.2  CL 108 108 104 106  CO2 26 25 25 24   GLUCOSE  145* 215* 181* 163*  BUN 26* 24* 27* 28*  CREATININE 1.24 1.35* 1.36* 1.25*  CALCIUM  8.7* 8.7* 8.6* 8.6*   GFR: Estimated Creatinine Clearance: 78 mL/min (A) (by C-G formula based on SCr of 1.25 mg/dL (H)). Liver Function Tests: Recent Labs  Lab 07/09/24 0503  AST 12*  ALT 15  ALKPHOS 71  BILITOT 0.6  PROT 7.3  ALBUMIN 2.8*   No results for input(s): LIPASE, AMYLASE in the last 168 hours. No results for input(s): AMMONIA in the last 168 hours. Coagulation Profile: No results for input(s): INR, PROTIME in the last 168 hours. Cardiac Enzymes: Recent Labs  Lab 07/11/24 0453  CKTOTAL 220   BNP (last 3 results) No results  for input(s): PROBNP in the last 8760 hours. HbA1C: No results for input(s): HGBA1C in the last 72 hours.  CBG: Recent Labs  Lab 07/11/24 0741 07/11/24 1104 07/11/24 1632 07/11/24 2152 07/12/24 0823  GLUCAP 137* 92 98 147* 95   Lipid Profile: No results for input(s): CHOL, HDL, LDLCALC, TRIG, CHOLHDL, LDLDIRECT in the last 72 hours. Thyroid  Function Tests: No results for input(s): TSH, T4TOTAL, FREET4, T3FREE, THYROIDAB in the last 72 hours. Anemia Panel: No results for input(s): VITAMINB12, FOLATE, FERRITIN, TIBC, IRON , RETICCTPCT in the last 72 hours. Sepsis Labs: No results for input(s): PROCALCITON, LATICACIDVEN in the last 168 hours.  No results found for this or any previous visit (from the past 240 hours).        Radiology Studies: US  EKG SITE RITE Result Date: 07/11/2024 If Site Rite image not attached, placement could not be confirmed due to current cardiac rhythm.       Scheduled Meds:  ascorbic acid   500 mg Oral Daily   Chlorhexidine  Gluconate Cloth  6 each Topical Daily   cyanocobalamin   1,000 mcg Oral Daily   enoxaparin  (LOVENOX ) injection  0.5 mg/kg Subcutaneous Q24H   folic acid   1 mg Oral Daily   gabapentin   300 mg Oral BID   insulin  aspart  0-15 Units Subcutaneous TID WC   insulin  aspart  0-5 Units Subcutaneous QHS   insulin  aspart  3 Units Subcutaneous TID WC   insulin  glargine-yfgn  25 Units Subcutaneous BID   iron  polysaccharides  150 mg Oral Daily   losartan   25 mg Oral Daily   mupirocin  ointment  1 Application Nasal BID   sodium chloride  flush  10-40 mL Intracatheter Q12H   Vitamin D  (Ergocalciferol )  50,000 Units Oral Q Tue   Continuous Infusions:  DAPTOmycin  800 mg (07/11/24 1322)   ertapenem  1 g (07/12/24 0645)     LOS: 16 days      Anthony CHRISTELLA Pouch, MD [facility] Pager [phone]  If 7PM-7AM, please contact night-coverage 07/12/2024, 9:23 AM

## 2024-07-13 DIAGNOSIS — B9562 Methicillin resistant Staphylococcus aureus infection as the cause of diseases classified elsewhere: Secondary | ICD-10-CM | POA: Diagnosis not present

## 2024-07-13 DIAGNOSIS — M86171 Other acute osteomyelitis, right ankle and foot: Secondary | ICD-10-CM | POA: Diagnosis not present

## 2024-07-13 DIAGNOSIS — R7881 Bacteremia: Secondary | ICD-10-CM | POA: Diagnosis not present

## 2024-07-13 LAB — CBC
HCT: 27.3 % — ABNORMAL LOW (ref 39.0–52.0)
Hemoglobin: 8.5 g/dL — ABNORMAL LOW (ref 13.0–17.0)
MCH: 25 pg — ABNORMAL LOW (ref 26.0–34.0)
MCHC: 31.1 g/dL (ref 30.0–36.0)
MCV: 80.3 fL (ref 80.0–100.0)
Platelets: 380 K/uL (ref 150–400)
RBC: 3.4 MIL/uL — ABNORMAL LOW (ref 4.22–5.81)
RDW: 15.9 % — ABNORMAL HIGH (ref 11.5–15.5)
WBC: 8.3 K/uL (ref 4.0–10.5)
nRBC: 0 % (ref 0.0–0.2)

## 2024-07-13 LAB — BASIC METABOLIC PANEL WITH GFR
Anion gap: 11 (ref 5–15)
BUN: 27 mg/dL — ABNORMAL HIGH (ref 6–20)
CO2: 25 mmol/L (ref 22–32)
Calcium: 8.8 mg/dL — ABNORMAL LOW (ref 8.9–10.3)
Chloride: 104 mmol/L (ref 98–111)
Creatinine, Ser: 1.31 mg/dL — ABNORMAL HIGH (ref 0.61–1.24)
GFR, Estimated: >60 mL/min (ref >60)
Glucose, Bld: 180 mg/dL — ABNORMAL HIGH (ref 70–99)
Potassium: 4.1 mmol/L (ref 3.5–5.1)
Sodium: 140 mmol/L (ref 135–145)

## 2024-07-13 LAB — GLUCOSE, CAPILLARY
Glucose-Capillary: 129 mg/dL — ABNORMAL HIGH (ref 70–99)
Glucose-Capillary: 146 mg/dL — ABNORMAL HIGH (ref 70–99)
Glucose-Capillary: 155 mg/dL — ABNORMAL HIGH (ref 70–99)
Glucose-Capillary: 128 mg/dL — ABNORMAL HIGH (ref 70–99)

## 2024-07-13 NOTE — Progress Notes (Signed)
 PROGRESS NOTE    [name]  FMW:[id number] DOB: [birth year] DOA: 06/26/2024 PCP: [name], MD   Assessment & Plan:   Principal Problem:   Acute osteomyelitis of right foot (HCC) Active Problems:   MRSA bacteremia   Uncontrolled type 2 diabetes mellitus with hyperglycemia, with long-term current use of insulin  [facility])   Essential hypertension   Dyslipidemia   Wound infection   Endocarditis  Assessment and Plan: Acute b/l foot infection/cellulitis: s/p right revision transmetatarsal amputation, left foot subcutaneous wound debridement with biopsy, left foot wound graft placement on 06/29/2024.  Surgical wound culture showed E. coli that is pansensitive. No evidence of osteomyelitis on path. Continue on IV ertapenem , dapto until 08/06/24 as per ID.   MRSA bacteremia: s/p TEE on 07/02/2024 which was negative for vegetations.  Preserved EF. Repeat blood culture from 06/30/2024-1 out of 4 bottles grew gram-positive rods but this is likely a contaminant.  Species could not be identified by the local lab. S/p PICC line on 07/11/24. Continue on IV ertapenem , dapto until 08/06/24 as per ID    DM2: likely poorly controlled. Continue on glargine, aspart, SSI w/ accuchecks   CKDIIIa: Cr is labile. Avoid nephrotoxic meds    ACD: likely secondary to CKD. H&H are stable    HTN: continue on home dose of losartan    HLD: continue on statin   Thrombocytosis: WNL today         DVT prophylaxis: lovenox  Code Status: full Family Communication:  Disposition Plan: SNF bed not available until 07/14/24 as per CM   Level of care: Med-Surg  Status is: Inpatient Remains inpatient appropriate because: will d/c tomorrow when SNF bed is available     Consultants:    Procedures:  Antimicrobials: dapto, ertapenem    Subjective: Pt c/o malaise   Objective: Vitals:   07/12/24 1443 07/12/24 2002 07/13/24 0410 07/13/24 0730  BP: 116/64 125/77 119/67 120/81  Pulse: 82 88 87 80  Resp: 18 18 16 17    Temp: 97.9 F (36.6 C) 98.4 F (36.9 C) 97.6 F (36.4 C) 98.3 F (36.8 C)  TempSrc:  Oral    SpO2: 100% 98% 100% 100%  Weight:      Height:        Intake/Output Summary (Last 24 hours) at 07/13/2024 0908 Last data filed at 07/12/2024 1300 Gross per 24 hour  Intake 240 ml  Output --  Net 240 ml   Filed Weights   06/26/24 1335  Weight: 102.1 kg    Examination:  General exam: appears calm & comfortable   Respiratory system: clear breath sounds b/l  Cardiovascular system: S1 & S2+. No rubs or clicks     Gastrointestinal system: abd is soft, NT, ND & hypoactive bowel sounds Central nervous system: alert & oriented.  Psychiatry: judgement and insight appears normal. Appropriate mood and affect     Data Reviewed: I have personally reviewed following labs and imaging studies  CBC: Recent Labs  Lab 07/09/24 0503 07/10/24 0525 07/11/24 0453 07/12/24 0415 07/13/24 0448  WBC 7.0 6.9 7.7 7.8 8.3  NEUTROABS 3.4  --   --   --   --   HGB 8.7* 8.5* 8.1* 8.5* 8.5*  HCT 28.6* 27.3* 27.0* 28.1* 27.3*  MCV 80.6 80.3 81.3 80.3 80.3  PLT 450* 402* 397 384 380   Basic Metabolic Panel: Recent Labs  Lab 07/09/24 0503 07/10/24 0525 07/11/24 0453 07/12/24 0415 07/13/24 0448  NA 139 138 137 138 140  K 4.3 4.3 4.1 4.2 4.1  CL 108 108 104 106 104  CO2 26 25 25 24 25   GLUCOSE 145* 215* 181* 163* 180*  BUN 26* 24* 27* 28* 27*  CREATININE 1.24 1.35* 1.36* 1.25* 1.31*  CALCIUM  8.7* 8.7* 8.6* 8.6* 8.8*   GFR: Estimated Creatinine Clearance: 74.4 mL/min (A) (by C-G formula based on SCr of 1.31 mg/dL (H)). Liver Function Tests: Recent Labs  Lab 07/09/24 0503  AST 12*  ALT 15  ALKPHOS 71  BILITOT 0.6  PROT 7.3  ALBUMIN 2.8*   No results for input(s): LIPASE, AMYLASE in the last 168 hours. No results for input(s): AMMONIA in the last 168 hours. Coagulation Profile: No results for input(s): INR, PROTIME in the last 168 hours. Cardiac Enzymes: Recent Labs  Lab  07/11/24 0453  CKTOTAL 220   BNP (last 3 results) No results for input(s): PROBNP in the last 8760 hours. HbA1C: No results for input(s): HGBA1C in the last 72 hours.  CBG: Recent Labs  Lab 07/12/24 0823 07/12/24 1143 07/12/24 1610 07/12/24 2109 07/13/24 0731  GLUCAP 95 114* 93 157* 129*   Lipid Profile: No results for input(s): CHOL, HDL, LDLCALC, TRIG, CHOLHDL, LDLDIRECT in the last 72 hours. Thyroid  Function Tests: No results for input(s): TSH, T4TOTAL, FREET4, T3FREE, THYROIDAB in the last 72 hours. Anemia Panel: No results for input(s): VITAMINB12, FOLATE, FERRITIN, TIBC, IRON , RETICCTPCT in the last 72 hours. Sepsis Labs: No results for input(s): PROCALCITON, LATICACIDVEN in the last 168 hours.  No results found for this or any previous visit (from the past 240 hours).        Radiology Studies: US  EKG SITE RITE Result Date: 07/11/2024 If Site Rite image not attached, placement could not be confirmed due to current cardiac rhythm.       Scheduled Meds:  ascorbic acid   500 mg Oral Daily   Chlorhexidine  Gluconate Cloth  6 each Topical Daily   cyanocobalamin   1,000 mcg Oral Daily   enoxaparin  (LOVENOX ) injection  0.5 mg/kg Subcutaneous Q24H   folic acid   1 mg Oral Daily   gabapentin   300 mg Oral BID   insulin  aspart  0-15 Units Subcutaneous TID WC   insulin  aspart  0-5 Units Subcutaneous QHS   insulin  aspart  3 Units Subcutaneous TID WC   insulin  glargine-yfgn  25 Units Subcutaneous BID   iron  polysaccharides  150 mg Oral Daily   losartan   25 mg Oral Daily   mupirocin  ointment  1 Application Nasal BID   sodium chloride  flush  10-40 mL Intracatheter Q12H   Vitamin D  (Ergocalciferol )  50,000 Units Oral Q Tue   Continuous Infusions:  DAPTOmycin  800 mg (07/12/24 1401)   ertapenem  1 g (07/13/24 0522)     LOS: 17 days      Anthony CHRISTELLA Pouch, MD [facility] Pager [phone]  If 7PM-7AM, please  contact night-coverage 07/13/2024, 9:08 AM

## 2024-07-13 NOTE — Plan of Care (Signed)
   Problem: Education: Goal: Knowledge of General Education information will improve Description: Including pain rating scale, medication(s)/side effects and non-pharmacologic comfort measures Outcome: Progressing   Problem: Activity: Goal: Risk for activity intolerance will decrease Outcome: Progressing

## 2024-07-14 DIAGNOSIS — B9562 Methicillin resistant Staphylococcus aureus infection as the cause of diseases classified elsewhere: Secondary | ICD-10-CM | POA: Diagnosis not present

## 2024-07-14 DIAGNOSIS — R7881 Bacteremia: Secondary | ICD-10-CM | POA: Diagnosis not present

## 2024-07-14 DIAGNOSIS — A4902 Methicillin resistant Staphylococcus aureus infection, unspecified site: Secondary | ICD-10-CM | POA: Insufficient documentation

## 2024-07-14 LAB — CBC
HCT: 27.3 % — ABNORMAL LOW (ref 39.0–52.0)
Hemoglobin: 8.4 g/dL — ABNORMAL LOW (ref 13.0–17.0)
MCH: 24.9 pg — ABNORMAL LOW (ref 26.0–34.0)
MCHC: 30.8 g/dL (ref 30.0–36.0)
MCV: 81 fL (ref 80.0–100.0)
Platelets: 348 K/uL (ref 150–400)
RBC: 3.37 MIL/uL — ABNORMAL LOW (ref 4.22–5.81)
RDW: 15.9 % — ABNORMAL HIGH (ref 11.5–15.5)
WBC: 8.2 K/uL (ref 4.0–10.5)
nRBC: 0 % (ref 0.0–0.2)

## 2024-07-14 LAB — BASIC METABOLIC PANEL WITH GFR
Anion gap: 3 — ABNORMAL LOW (ref 5–15)
BUN: 30 mg/dL — ABNORMAL HIGH (ref 6–20)
CO2: 26 mmol/L (ref 22–32)
Calcium: 8.6 mg/dL — ABNORMAL LOW (ref 8.9–10.3)
Chloride: 107 mmol/L (ref 98–111)
Creatinine, Ser: 1.41 mg/dL — ABNORMAL HIGH (ref 0.61–1.24)
GFR, Estimated: 58 mL/min — ABNORMAL LOW (ref >60)
Glucose, Bld: 205 mg/dL — ABNORMAL HIGH (ref 70–99)
Potassium: 4.2 mmol/L (ref 3.5–5.1)
Sodium: 136 mmol/L (ref 135–145)

## 2024-07-14 LAB — GLUCOSE, CAPILLARY
Glucose-Capillary: 192 mg/dL — ABNORMAL HIGH (ref 70–99)
Glucose-Capillary: 111 mg/dL — ABNORMAL HIGH (ref 70–99)
Glucose-Capillary: 202 mg/dL — ABNORMAL HIGH (ref 70–99)

## 2024-07-14 MED ORDER — MUPIROCIN 2 % EX OINT: 1.0000 | TOPICAL_OINTMENT | Freq: Two times a day (BID) | CUTANEOUS | Status: AC

## 2024-07-14 MED ORDER — OXYCODONE-ACETAMINOPHEN 5-325 MG PO TABS: 1.0000 | ORAL_TABLET | ORAL | 0 refills | Status: AC | PRN

## 2024-07-14 MED ORDER — DAPTOMYCIN IV (FOR PTA / DISCHARGE USE ONLY): 800.0000 mg | INTRAVENOUS | 0 refills | Status: AC

## 2024-07-14 MED ORDER — ERTAPENEM IV (FOR PTA / DISCHARGE USE ONLY): 1.0000 g | INTRAVENOUS | 0 refills | Status: AC

## 2024-07-14 MED ORDER — GABAPENTIN 300 MG PO CAPS: 300.0000 mg | ORAL_CAPSULE | Freq: Two times a day (BID) | ORAL | 0 refills | Status: DC

## 2024-07-14 NOTE — TOC Transition Note (Addendum)
 Transition of Care [facility]) - Discharge Note   Patient Details  Name: [name] MRN: [id number] Date of Birth: [birth year]  Transition [facility] Care [facility]) CM/SW Contact:  Alvaro Louder, LCSW Phone Number: 07/14/2024, 1:39 PM   Clinical Narrative:  [facility] recived insurance approval for patient to admit to SNF. LCSWA confirmed with MD that patient is stable for DC. LCSWA notified patient and they are in agreement with DC. LCSWA confirmed bed is available at [facility]. Transport arranged with [facility] [phone]  to arrange for [facility] for next available.   Number to call report: [phone], RM: [phone]   TOC signing off  Final next level of care: Skilled Nursing Facility Barriers to Discharge: No Barriers Identified   Patient Goals and CMS Choice            Discharge Placement              Patient chooses bed at:  [facility]) Patient to be transferred to facility by: [facility] Name of family member notified: Self Patient and family notified of of transfer: 07/14/24  Discharge Plan and Services Additional resources added to the After Visit Summary for                                       Social Drivers of Health (SDOH) Interventions SDOH Screenings   Food Insecurity: No Food Insecurity (06/26/2024)  Housing: Low Risk  (06/26/2024)  Transportation Needs: No Transportation Needs (06/26/2024)  Utilities: Not At Risk (06/26/2024)  Depression (PHQ2-9): Low Risk  (08/28/2023)  Financial Resource Strain: Patient Declined (06/28/2023)   Received from [place] [facility]  Social Connections: Moderately Integrated (06/26/2024)  Tobacco Use: Medium Risk (06/26/2024)  Health Literacy: Adequate Health Literacy (05/01/2024)     Readmission Risk Interventions     No data to display

## 2024-07-14 NOTE — Progress Notes (Signed)
 Report called to Debo, RN at facility.

## 2024-07-14 NOTE — Progress Notes (Signed)
 Occupational Therapy Treatment Patient Details Name: [name] MRN: [id number] DOB: 10-09-1966 Today's Date: 07/14/2024   History of present illness Pt is a 58 y.o. who presented with redness, painn swelling, and discharge from the previous R TMA site on 06/26/24 and is now s/p revision TMA and debridement of chronic L foot ulcers with skin graft on 7/27. Per surgical note Pt is to be NWB for the most part at all times, but may apply a little bit of weight to his heels for transfers, but not to the balls of the feet. PMH includes: DM, DFI, MRSA bacteremia and MRSA foot infection, R TMA, left foot chronic wound.   OT comments  Pt seen for OT tx. Pt reports eager to get to rehab and continue to let feet heal. Additional education and reinforcement provided for WBing restrictions, wound healing, falls prevention, and safety. Pt verbalized understanding, asked some relevant questions which OT answered. Pt declined additional concerns. Reports he will be leaving this afternoon to rehab.       If plan is discharge home, recommend the following:  Assistance with cooking/housework;Help with stairs or ramp for entrance;Assist for transportation;A little help with walking and/or transfers;A little help with bathing/dressing/bathroom   Equipment Recommendations  Wheelchair (measurements OT);Wheelchair cushion (measurements OT);Tub/shower bench    Recommendations for Other Services      Precautions / Restrictions Precautions Precautions: Fall Recall of Precautions/Restrictions: Intact Restrictions Weight Bearing Restrictions Per Provider Order: Yes RLE Weight Bearing Per Provider Order: Non weight bearing LLE Weight Bearing Per Provider Order: Non weight bearing Other Position/Activity Restrictions: per podiatrist pt is allowed heel WB for transfers only in post op shoes, no walking or WB through balls of the feet       Mobility Bed Mobility               General bed mobility comments:  NT, in w/c pre/post session    Transfers                         Balance                                           ADL either performed or assessed with clinical judgement   ADL                                              Extremity/Trunk Assessment              Vision       Perception     Praxis     Communication     Cognition Arousal: Alert Behavior During Therapy: [facility] for tasks assessed/performed Cognition: No apparent impairments                                        Cueing      Exercises Other Exercises Other Exercises: Additional education and reinforcement provided for WBing restrictions, wound healing, falls prevention, and safety    Shoulder Instructions       General Comments      Pertinent Vitals/ Pain       Pain Assessment Pain Assessment:  No/denies pain  Home Living                                          Prior Functioning/Environment              Frequency  Min 2X/week        Progress Toward Goals  OT Goals(current goals can now be found in the care plan section)  Progress towards OT goals: Progressing toward goals  Acute Rehab OT Goals Patient Stated Goal: heal wounds OT Goal Formulation: With patient Time For Goal Achievement: 07/14/24 Potential to Achieve Goals: Good  Plan      Co-evaluation                 AM-PAC OT 6 Clicks Daily Activity     Outcome Measure   Help from another person eating meals?: None Help from another person taking care of personal grooming?: None Help from another person toileting, which includes using toliet, bedpan, or urinal?: A Little Help from another person bathing (including washing, rinsing, drying)?: A Little Help from another person to put on and taking off regular upper body clothing?: None Help from another person to put on and taking off regular lower body clothing?: A Little 6  Click Score: 21    End of Session    OT Visit Diagnosis: Other abnormalities of gait and mobility (R26.89);Pain Pain - Right/Left: Right Pain - part of body: Ankle and joints of foot   Activity Tolerance Patient tolerated treatment well   Patient Left with call bell/phone within reach;Other (comment) (in w/c all needs in reach)   Nurse Communication          Time: 8791-8783 OT Time Calculation (min): 8 min  Charges: OT General Charges $OT Visit: 1 Visit OT Treatments $Therapeutic Activity: 8-22 mins  Warren SAUNDERS., MPH, MS, OTR/L ascom [phone] 07/14/24, 12:39 PM

## 2024-07-14 NOTE — Plan of Care (Signed)

## 2024-07-14 NOTE — TOC Progression Note (Signed)
 Transition of Care [facility]) - Progression Note    Patient Details  Name: Nicolaus Andel MRN: [id number] Date of Birth: 1966-05-14  Transition of Care [facility]) CM/SW Contact  Alvaro Louder, [place] Phone Number: 07/14/2024, 4:29 PM  Clinical Narrative:   ISRAEL [place] out to [facility] about transport. [facility] indicated that they have to be called from [facility] in order to come pick up the patient. LCSWA called [facility] Medicaid transport at 12:50 PM to coordinate with [facility] for transport. LCSWA followed up with [facility] at 4:00 PM and they have not initiated transport yet.   TOC to follow up for discharge.        Barriers to Discharge: No Barriers Identified               Expected Discharge Plan and Services         Expected Discharge Date: 07/14/24                                     Social Drivers of Health (SDOH) Interventions SDOH Screenings   Food Insecurity: No Food Insecurity (06/26/2024)  Housing: Low Risk  (06/26/2024)  Transportation Needs: No Transportation Needs (06/26/2024)  Utilities: Not At Risk (06/26/2024)  Depression (PHQ2-9): Low Risk  (08/28/2023)  Financial Resource Strain: Patient Declined (06/28/2023)   Received from [place] [facility]  Social Connections: Moderately Integrated (06/26/2024)  Tobacco Use: Medium Risk (06/26/2024)  Health Literacy: Adequate Health Literacy (05/01/2024)    Readmission Risk Interventions     No data to display

## 2024-07-14 NOTE — TOC Progression Note (Addendum)
 Transition of Care [facility]) - Progression Note    Patient Details  Name: [name] MRN: [id number] Date of Birth: June 12, 1966  Transition of Care [facility]) CM/SW Contact  Alvaro Louder, [place] Phone Number: 07/14/2024, 9:49 AM  Clinical Narrative:  ISRAEL S[place]on to [facility]. She indicated that insurance shara is still pending and they are anticipating patient to admit tomorrow.   12:05 PM: SNF stated that insurance shara has been approved and patient can admit today  TOC to follow for discharge                        Expected Discharge Plan and Services                                               Social Drivers of Health (SDOH) Interventions SDOH Screenings   Food Insecurity: No Food Insecurity (06/26/2024)  Housing: Low Risk  (06/26/2024)  Transportation Needs: No Transportation Needs (06/26/2024)  Utilities: Not At Risk (06/26/2024)  Depression (PHQ2-9): Low Risk  (08/28/2023)  Financial Resource Strain: Patient Declined (06/28/2023)   Received from [place] [facility]  Social Connections: Moderately Integrated (06/26/2024)  Tobacco Use: Medium Risk (06/26/2024)  Health Literacy: Adequate Health Literacy (05/01/2024)    Readmission Risk Interventions     No data to display

## 2024-07-14 NOTE — Plan of Care (Signed)
 Problem: Education: Goal: Knowledge of General Education information will improve Description: Including pain rating scale, medication(s)/side effects and non-pharmacologic comfort measures 07/14/2024 1750 by Jenel Candis HERO, RN Outcome: Adequate for Discharge 07/14/2024 1742 by Jenel Candis HERO, RN Outcome: Progressing   Problem: Health Behavior/Discharge Planning: Goal: Ability to manage health-related needs will improve 07/14/2024 1750 by Jenel Candis HERO, RN Outcome: Adequate for Discharge 07/14/2024 1742 by Jenel Candis HERO, RN Outcome: Progressing   Problem: Clinical Measurements: Goal: Ability to maintain clinical measurements within normal limits will improve 07/14/2024 1750 by Jenel Candis HERO, RN Outcome: Adequate for Discharge 07/14/2024 1742 by Jenel Candis HERO, RN Outcome: Progressing Goal: Will remain free from infection 07/14/2024 1750 by Jenel Candis HERO, RN Outcome: Adequate for Discharge 07/14/2024 1742 by Jenel Candis HERO, RN Outcome: Progressing Goal: Diagnostic test results will improve 07/14/2024 1750 by Jenel Candis HERO, RN Outcome: Adequate for Discharge 07/14/2024 1742 by Jenel Candis HERO, RN Outcome: Progressing Goal: Respiratory complications will improve 07/14/2024 1750 by Jenel Candis HERO, RN Outcome: Adequate for Discharge 07/14/2024 1742 by Jenel Candis HERO, RN Outcome: Progressing Goal: Cardiovascular complication will be avoided 07/14/2024 1750 by Jenel Candis HERO, RN Outcome: Adequate for Discharge 07/14/2024 1742 by Jenel Candis HERO, RN Outcome: Progressing   Problem: Activity: Goal: Risk for activity intolerance will decrease 07/14/2024 1750 by Jenel Candis HERO, RN Outcome: Adequate for Discharge 07/14/2024 1742 by Jenel Candis HERO, RN Outcome: Progressing   Problem: Nutrition: Goal: Adequate nutrition will be maintained 07/14/2024 1750 by Jenel Candis HERO, RN Outcome: Adequate for Discharge 07/14/2024 1742 by Jenel Candis HERO, RN Outcome: Progressing   Problem:  Coping: Goal: Level of anxiety will decrease 07/14/2024 1750 by Jenel Candis HERO, RN Outcome: Adequate for Discharge 07/14/2024 1742 by Jenel Candis HERO, RN Outcome: Progressing   Problem: Elimination: Goal: Will not experience complications related to bowel motility 07/14/2024 1750 by Jenel Candis HERO, RN Outcome: Adequate for Discharge 07/14/2024 1742 by Jenel Candis HERO, RN Outcome: Progressing Goal: Will not experience complications related to urinary retention 07/14/2024 1750 by Jenel Candis HERO, RN Outcome: Adequate for Discharge 07/14/2024 1742 by Jenel Candis HERO, RN Outcome: Progressing   Problem: Pain Managment: Goal: General experience of comfort will improve and/or be controlled 07/14/2024 1750 by Jenel Candis HERO, RN Outcome: Adequate for Discharge 07/14/2024 1742 by Jenel Candis HERO, RN Outcome: Progressing   Problem: Safety: Goal: Ability to remain free from injury will improve 07/14/2024 1750 by Jenel Candis HERO, RN Outcome: Adequate for Discharge 07/14/2024 1742 by Jenel Candis HERO, RN Outcome: Progressing   Problem: Skin Integrity: Goal: Risk for impaired skin integrity will decrease 07/14/2024 1750 by Jenel Candis HERO, RN Outcome: Adequate for Discharge 07/14/2024 1742 by Jenel Candis HERO, RN Outcome: Progressing   Problem: Clinical Measurements: Goal: Ability to avoid or minimize complications of infection will improve 07/14/2024 1750 by Jenel Candis HERO, RN Outcome: Adequate for Discharge 07/14/2024 1742 by Jenel Candis HERO, RN Outcome: Progressing   Problem: Skin Integrity: Goal: Skin integrity will improve 07/14/2024 1750 by Jenel Candis HERO, RN Outcome: Adequate for Discharge 07/14/2024 1742 by Jenel Candis HERO, RN Outcome: Progressing   Problem: Education: Goal: Ability to describe self-care measures that may prevent or decrease complications (Diabetes Survival Skills Education) will improve 07/14/2024 1750 by Jenel Candis HERO, RN Outcome: Adequate for Discharge 07/14/2024 1742 by  Jenel Candis HERO, RN Outcome: Progressing Goal: Individualized Educational Video(s) 07/14/2024 1750 by Jenel Candis HERO, RN Outcome: Adequate for Discharge 07/14/2024 1742 by Jenel Candis HERO, RN  Outcome: Progressing   Problem: Coping: Goal: Ability to adjust to condition or change in health will improve 07/14/2024 1750 by Jenel Candis HERO, RN Outcome: Adequate for Discharge 07/14/2024 1742 by Jenel Candis HERO, RN Outcome: Progressing   Problem: Fluid Volume: Goal: Ability to maintain a balanced intake and output will improve 07/14/2024 1750 by Jenel Candis HERO, RN Outcome: Adequate for Discharge 07/14/2024 1742 by Jenel Candis HERO, RN Outcome: Progressing   Problem: Health Behavior/Discharge Planning: Goal: Ability to identify and utilize available resources and services will improve 07/14/2024 1750 by Jenel Candis HERO, RN Outcome: Adequate for Discharge 07/14/2024 1742 by Jenel Candis HERO, RN Outcome: Progressing Goal: Ability to manage health-related needs will improve 07/14/2024 1750 by Jenel Candis HERO, RN Outcome: Adequate for Discharge 07/14/2024 1742 by Jenel Candis HERO, RN Outcome: Progressing   Problem: Metabolic: Goal: Ability to maintain appropriate glucose levels will improve 07/14/2024 1750 by Jenel Candis HERO, RN Outcome: Adequate for Discharge 07/14/2024 1742 by Jenel Candis HERO, RN Outcome: Progressing   Problem: Nutritional: Goal: Maintenance of adequate nutrition will improve 07/14/2024 1750 by Jenel Candis HERO, RN Outcome: Adequate for Discharge 07/14/2024 1742 by Jenel Candis HERO, RN Outcome: Progressing Goal: Progress toward achieving an optimal weight will improve 07/14/2024 1750 by Jenel Candis HERO, RN Outcome: Adequate for Discharge 07/14/2024 1742 by Jenel Candis HERO, RN Outcome: Progressing   Problem: Skin Integrity: Goal: Risk for impaired skin integrity will decrease 07/14/2024 1750 by Jenel Candis HERO, RN Outcome: Adequate for Discharge 07/14/2024 1742 by Jenel Candis HERO, RN Outcome:  Progressing   Problem: Tissue Perfusion: Goal: Adequacy of tissue perfusion will improve 07/14/2024 1750 by Jenel Candis HERO, RN Outcome: Adequate for Discharge 07/14/2024 1742 by Jenel Candis HERO, RN Outcome: Progressing

## 2024-07-14 NOTE — Discharge Summary (Addendum)
 Physician Discharge Summary  [name] FMW:[id number] DOB: 05/18/1966 DOA: 06/26/2024  PCP: Matthew Clam, MD  Admit date: 06/26/2024 Discharge date: 07/14/2024  Admitted From: home Disposition:  SNF  Recommendations for Outpatient Follow-up:  Follow up with PCP in 1-2 weeks F/u w/ podiatry, [name], 1 week Both dressings can be left clean and dry for the next week as per podiatry   Home Health: no  Equipment/Devices: PICC line  Discharge Condition: stable  CODE STATUS: full  Diet recommendation: Carb Modified   Brief/Interim Summary: HPI was taken from Dr. Lawence: [name] is a 58 y.o. African-American male with medical history significant for hypertension, dyslipidemia, tobacco abuse, stage IIIa chronic kidney disease, cocaine abuse, and type 2 diabetes mellitus, who presented to the emergency room with acute onset of right foot pain with drainage from a recent toe amputation wound of clear fluid.  No fever or chills.  No nausea or vomiting or abdominal pain.  No chest pain or palpitations.  No cough or wheezing or dyspnea.  No dysuria, oliguria or hematuria or flank pain.   ED Course: When the patient came to the ER, vital signs were within normal.  Labs revealed hyperglycemia of 345, creatinine of 1.42 and albumin 2.8 with otherwise unremarkable CMP.  CBC showed leukocytosis of 12.3 with neutrophilia and anemia with hemoglobin 8.7 hematocrit 29.2 and thrombocytosis of 493.  Lactic acid was 1.6 and later 0.9.  Blood cultures were drawn.  Wound culture was obtained. EKG as reviewed by me :  None. Imaging: MRI of the foot with and without contrast revealed the following: 1. Status post interval second through fifth ray amputation at the level of the mid metatarsal shafts. Associated soft tissue wound extending along the dorsal stump into the deep soft tissues overlying the second through fifth digit metatarsal transsection margins which demonstrate T2/STIR hyperintense marrow  edema with corresponding T1 hypointensity. These findings are concerning for osteomyelitis of the remnant second through fifth metatarsals, although, recent postsurgical change can demonstrate a similar appearance. Cellulitis cannot be excluded. No drainable abscess. 2. Diffuse nonspecific increased T2 signal of the intrinsic musculature of the foot, myositis cannot be excluded. 3. Tenosynovitis of the posterior tibialis, flexor digitorum, and flexor hallucis longus tendons.   The patient was given 400 mg IV Cipro , 4 mg of IV morphine  sulfate and 4 mg of IV Zofran .  He will be admitted to a medical-surgical bed for further evaluation and management.  Discharge Diagnoses:  Principal Problem:   Acute osteomyelitis of right foot (HCC) Active Problems:   MRSA bacteremia   Uncontrolled type 2 diabetes mellitus with hyperglycemia, with long-term current use of insulin  (HCC)   Essential hypertension   Dyslipidemia   Wound infection   Endocarditis  Acute b/l foot infection/cellulitis: s/p right revision transmetatarsal amputation, left foot subcutaneous wound debridement with biopsy, left foot wound graft placement on 06/29/2024.  Surgical wound culture showed E. coli that is pansensitive. No evidence of osteomyelitis on path. Continue on IV ertapenem , dapto until 08/06/24 as per ID.   MRSA bacteremia: s/p TEE on 07/02/2024 which was negative for vegetations.  Preserved EF. Repeat blood culture from 06/30/2024-1 out of 4 bottles grew gram-positive rods but this is likely a contaminant.  Species could not be identified by the local lab. S/p PICC line on 07/11/24. Continue on IV ertapenem , dapto until 08/06/24 as per ID    DM2: likely poorly controlled. Restart home anti-DM meds at d/c   CKDIIIa: Cr is labile. Avoid nephrotoxic  meds    ACD: likely secondary to CKD. H&H are stable    HTN: continue on home dose of losartan    HLD: continue on statin   Thrombocytosis: WNL today     Discharge  Instructions  Discharge Instructions     [facility] pharmacist to adjust dose for Vancomycin , Aminoglycosides and other anti-infective therapies as requested by physician.   Complete by: As directed    [facility] to provide Cath Flo 2mg    Complete by: As directed    Administer for PICC line occlusion and as ordered by physician for other access device issues.   Anaphylaxis Kit: Provided to treat any anaphylactic reaction to the medication being provided to the patient if First Dose or when requested by physician   Complete by: As directed    Epinephrine  1mg /ml vial / amp: Administer 0.3mg  (0.72ml) subcutaneously once for moderate to severe anaphylaxis, nurse to call physician and pharmacy when reaction occurs and call 911 if needed for immediate care   Diphenhydramine  50mg /ml IV vial: Administer 25-50mg  IV/IM PRN for first dose reaction, rash, itching, mild reaction, nurse to call physician and pharmacy when reaction occurs   Sodium Chloride  0.9% NS 500ml IV: Administer if needed for hypovolemic blood pressure drop or as ordered by physician after call to physician with anaphylactic reaction   Change dressing on IV access line weekly and PRN   Complete by: As directed    Diet Carb Modified   Complete by: As directed    Discharge instructions   Complete by: As directed    F/u w/ PCP in 1-2 weeks. F/u w/ podiatry, [name], in 1 week. Both dressings can be left clean and dry for the next week as per podiatry   Flush IV access with Sodium Chloride  0.9% and Heparin  10 units/ml or 100 units/ml   Complete by: As directed    Home infusion instructions - [facility]   Complete by: As directed    Instructions: Flush IV access with Sodium Chloride  0.9% and Heparin  10units/ml or 100units/ml   Change dressing on IV access line: Weekly and PRN   Instructions Cath Flo 2mg : Administer for PICC Line occlusion and as ordered by physician for other access device    [facility] pharmacist to adjust dose for: Vancomycin , Aminoglycosides and other anti-infective therapies as requested by physician   Increase activity slowly   Complete by: As directed    Method of administration may be changed at the discretion of home infusion pharmacist based upon assessment of the patient and/or caregiver's ability to self-administer the medication ordered   Complete by: As directed    No wound care   Complete by: As directed       Allergies as of 07/14/2024   No Known Allergies      Medication List     STOP taking these medications    doxycycline  100 MG tablet Commonly known as: VIBRA -TABS   methocarbamol  750 MG tablet Commonly known as: ROBAXIN        TAKE these medications    Accu-Chek Guide Test test strip Generic drug: glucose blood Use to check blood sugar 3 times daily.   Accu-Chek Guide w/Device Kit Use to check blood sugar 3 times daily.   Accu-Chek Softclix Lancets lancets Use to check blood sugar 3 times daily.   ascorbic acid  500 MG tablet Commonly known as: VITAMIN C  Take 1 tablet (500 mg total) by mouth daily.   bisacodyl  5 MG EC  tablet Generic drug: bisacodyl  Take 2 tablets (10 mg total) by mouth at bedtime as needed for moderate constipation.   chlorhexidine  4 % external liquid Commonly known as: HIBICLENS  Apply 15 mLs (1 Application total) topically as directed for 30 doses. Use as directed daily for 5 days every other week for 6 weeks.   cyanocobalamin  1000 MCG tablet Commonly known as: VITAMIN B12 Take 1 tablet (1,000 mcg total) by mouth daily.   daptomycin  IVPB Commonly known as: CUBICIN  Inject 800 mg into the vein daily. Indication:  MRSA bacteremia and diabetic foot infection Last Day of Therapy:  08/06/24 Labs - Once weekly:  CBC/D, CK, CMP, CRP and ESR Method of administration: IV Push Method of administration may be changed at the discretion of home infusion pharmacist based upon assessment of the  patient and/or caregiver's ability to self-administer the medication ordered. --please pull PIC at completion of IV antibiotics Fax weekly lab results promptly to [phone] and [phone] Call [phone] with any critical value   Dexcom G6 Transmitter Misc Use to check blood sugar three times daily. Change transmitter once every 909 days. E11.69   ertapenem  IVPB Commonly known as: INVANZ  Inject 1 g into the vein daily. Indication:  MRSA bacteremia and diabetic foot infection Last Day of Therapy:  08/06/24 Labs - Once weekly:  CBC/D, CK, CMP, CRP, ESR Method of administration: Mini-Bag Plus / Gravity Method of administration may be changed at the discretion of home infusion pharmacist based upon assessment of the patient and/or caregiver's ability to self-administer the medication ordered. --please pull PIC at completion of IV antibiotics Fax weekly lab results promptly to [phone] and [phone] Call [phone] with any critical value   Ferrex 150 150 MG capsule Generic drug: iron  polysaccharides Take 1 capsule (150 mg total) by mouth daily.   gabapentin  300 MG capsule Commonly known as: NEURONTIN  Take 1 capsule (300 mg total) by mouth 2 (two) times daily. What changed: when to take this   HumuLIN  N KwikPen 100 UNIT/ML KwikPen Generic drug: Insulin  NPH (Human) (Isophane) Inject 22 Units into the skin 2 (two) times daily.   Insulin  Pen Needle 32G X 4 MM Misc Use as directed to inject insulin  up to 4 times daily.   losartan  25 MG tablet Commonly known as: COZAAR  Take 1 tablet (25 mg total) by mouth daily. What changed: when to take this   metFORMIN  500 MG 24 hr tablet Commonly known as: GLUCOPHAGE -XR Take 2 tablets (1,000 mg total) by mouth 2 (two) times daily with a meal.   mupirocin  ointment 2 % Commonly known as: BACTROBAN  Use as directed into the nose  2 times daily for 5 days every other week for 6 weeks.   nitroGLYCERIN  0.2 mg/hr  patch Commonly known as: NITRODUR - Dosed in mg/24 hr Place 1 patch (0.2 mg total) onto the skin daily.   NovoLOG  FlexPen 100 UNIT/ML FlexPen Generic drug: insulin  aspart Inject 0-15 Units into the skin 3 (three) times daily before meals.  Correction coverage: Moderate (average weight, post-op) CBG 70 - 120: 0 units CBG 121 - 150: 2 units CBG 151 - 200: 3 units CBG 201 - 250: 5 units CBG 251 - 300: 8 units CBG 301 - 350: 11 units CBG 351 - 400: 15 units CBG > 400: call MD   oxyCODONE -acetaminophen  5-325 MG tablet Commonly known as: Percocet Take 1 tablet by mouth every 4 (four) hours as needed for up to 1 day for severe pain (  pain score 7-10) or moderate pain (pain score 4-6). What changed:  how much to take when to take this reasons to take this additional instructions   polyethylene glycol powder 17 GM/SCOOP powder Commonly known as: GLYCOLAX /MIRALAX  Take 17 g by mouth 2 (two) times daily. Mix as directed. What changed:  when to take this reasons to take this   Vitamin D  (Ergocalciferol ) 1.25 MG (50000 UNIT) Caps capsule Commonly known as: DRISDOL  Take 1 capsule (50,000 Units total) by mouth every 7 (seven) days. What changed: when to take this               Discharge Care Instructions  (From admission, onward)           Start     Ordered   07/14/24 0000  Change dressing on IV access line weekly and PRN  (Home infusion instructions - [facility] )        07/14/24 1159            Contact information for follow-up providers     Lennie Barter, DPM. Schedule an appointment as soon as possible for a visit in 1 week(s).   Specialty: Podiatry Why: For wound re-check Contact information: [address] [place] [address] [phone]              Contact information for after-discharge care     Destination     [facility] .   Service: Skilled Nursing Contact information: [address] [place]  [address] [phone]                    No Known Allergies  Consultations: Podiatry ID   Procedures/Studies: US  EKG SITE RITE Result Date: 07/11/2024 If Site Rite image not attached, placement could not be confirmed due to current cardiac rhythm.  ECHO TEE Result Date: 07/02/2024    TRANSESOPHOGEAL ECHO REPORT   Patient Name:   [name] Date of Exam: 07/02/2024 Medical Rec #:  [id number]  Height:       70.0 in Accession #:    [id number] Weight:       225.0 lb Date of Birth:  02-03-1966  BSA:          2.194 m Patient Age:    58 years   BP:           120/79 mmHg Patient Gender: M          HR:           74 bpm. Exam Location:  [facility] Procedure: Transesophageal Echo, Cardiac Doppler, Color Doppler and Saline            Contrast Bubble Study (Both Spectral and Color Flow Doppler were            utilized during procedure). Indications:     Bacteremia R78.81  History:         Patient has prior history of Echocardiogram examinations, most                  recent 06/28/2024. Risk Factors:Hypertension and Diabetes.  Sonographer:     Christopher Furnace Referring Phys:  [id number] [facility] L CAREY Diagnosing Phys: Evalene Lunger MD PROCEDURE: After discussion of the risks and benefits of a TEE, an informed consent was obtained from the patient. TEE procedure time was 30 minutes. The transesophogeal probe was passed without difficulty through the esophogus of the patient. Imaged were obtained with the patient in a left  lateral decubitus position. Local oropharyngeal anesthetic was provided with Cetacaine  and viscous lidocaine . Sedation performed by different physician. The patient was monitored while under deep sedation. Image quality was excellent. The patient's vital signs; including heart rate, blood pressure, and oxygen  saturation; remained stable throughout the procedure. The patient developed no complications during the procedure.  IMPRESSIONS  1. No valve vegetation noted  2. Left  ventricular ejection fraction, by estimation, is 60 to 65%. The left ventricle has normal function. The left ventricle has no regional wall motion abnormalities.  3. Right ventricular systolic function is normal. The right ventricular size is normal.  4. No left atrial/left atrial appendage thrombus was detected.  5. The mitral valve is normal in structure. Trivial mitral valve regurgitation. No evidence of mitral stenosis.  6. The aortic valve is normal in structure. Aortic valve regurgitation is not visualized. No aortic stenosis is present.  7. The inferior vena cava is normal in size with greater than 50% respiratory variability, suggesting right atrial pressure of 3 mmHg.  8. Agitated saline contrast bubble study was negative, with no evidence of any interatrial shunt. Conclusion(s)/Recommendation(s): Normal biventricular function without evidence of hemodynamically significant valvular heart disease. FINDINGS  Left Ventricle: Left ventricular ejection fraction, by estimation, is 60 to 65%. The left ventricle has normal function. The left ventricle has no regional wall motion abnormalities. The left ventricular internal cavity size was normal in size. There is  no left ventricular hypertrophy. Right Ventricle: The right ventricular size is normal. No increase in right ventricular wall thickness. Right ventricular systolic function is normal. Left Atrium: Left atrial size was normal in size. No left atrial/left atrial appendage thrombus was detected. Right Atrium: Right atrial size was normal in size. Pericardium: There is no evidence of pericardial effusion. Mitral Valve: The mitral valve is normal in structure. Trivial mitral valve regurgitation. No evidence of mitral valve stenosis. There is no evidence of mitral valve vegetation. Tricuspid Valve: The tricuspid valve is normal in structure. Tricuspid valve regurgitation is trivial. No evidence of tricuspid stenosis. There is no evidence of tricuspid valve  vegetation. Aortic Valve: The aortic valve is normal in structure. Aortic valve regurgitation is not visualized. No aortic stenosis is present. There is no evidence of aortic valve vegetation. Pulmonic Valve: The pulmonic valve was normal in structure. Pulmonic valve regurgitation is trivial. No evidence of pulmonic stenosis. There is no evidence of pulmonic valve vegetation. Aorta: The aortic root is normal in size and structure. Venous: The inferior vena cava is normal in size with greater than 50% respiratory variability, suggesting right atrial pressure of 3 mmHg. IAS/Shunts: No atrial level shunt detected by color flow Doppler. Agitated saline contrast was given intravenously to evaluate for intracardiac shunting. Agitated saline contrast bubble study was negative, with no evidence of any interatrial shunt. There  is no evidence of a patent foramen ovale. There is no evidence of an atrial septal defect. Additional Comments: 3D was performed not requiring image post processing on an independent workstation and was indeterminate. Timothy Gollan MD Electronically signed by Evalene Lunger MD Signature Date/Time: 07/02/2024/10:21:52 AM    Final    DG Foot Complete Left Result Date: 06/29/2024 CLINICAL DATA:  Postop EXAM: LEFT FOOT - COMPLETE 3+ VIEW COMPARISON:  03/25/2024.  MRI 06/21/2024 FINDINGS: Postoperative changes noted in the plantar soft tissues over the 2nd MTP joint and 4/5 metatarsals. No change in the appearance since prior study. No fracture, subluxation or dislocation. Chronic deformity of the distal 5th metatarsal and  metatarsal head. Chronic periosteal reaction within the 5th metatarsal. IMPRESSION: Postoperative changes within the plantar soft tissues. No bony changes since prior study. Electronically Signed   By: Franky Crease M.D.   On: 06/29/2024 15:07   DG Foot Complete Right Result Date: 06/29/2024 CLINICAL DATA:  Postop EXAM: RIGHT FOOT COMPLETE - 3+ VIEW COMPARISON:  04/22/2024 FINDINGS:  Interval transmetatarsal amputation of the remaining toes. Soft tissue gas noted, likely postoperative. No complicating feature. IMPRESSION: Transmetatarsal amputations of the remaining 2nd through 5th toes. No complicating feature. Electronically Signed   By: Franky Crease M.D.   On: 06/29/2024 15:04   DG MINI C-ARM IMAGE ONLY Result Date: 06/29/2024 There is no interpretation for this exam.  This order is for images obtained during a surgical procedure.  Please See Surgeries Tab for more information regarding the procedure.   ECHOCARDIOGRAM COMPLETE Result Date: 06/28/2024    ECHOCARDIOGRAM REPORT   Patient Name:   [name] Date of Exam: 06/28/2024 Medical Rec #:  [id number]  Height:       70.0 in Accession #:    [id number] Weight:       225.0 lb Date of Birth:  [birth year]  BSA:          2.194 m Patient Age:    58 years   BP:           118/64 mmHg Patient Gender: M          HR:           92 bpm. Exam Location:  [facility] Procedure: 2D Echo, Cardiac Doppler, Color Doppler and Strain Analysis (Both            Spectral and Color Flow Doppler were utilized during procedure). Indications:     Bacteremia R78.81  History:         Patient has prior history of Echocardiogram examinations, most                  recent 04/23/2024.  Sonographer:     Thedora Louder RDCS, FASE Referring Phys:  [id number] SUMAYYA AMIN Diagnosing Phys: Dwayne D Callwood MD  Sonographer Comments: Global longitudinal strain was attempted. IMPRESSIONS  1. Bicspid AoV can not be excluded , no evidence of AS or AI.  2. Consider TEE if any concerns.  3. Left ventricular ejection fraction, by estimation, is 60 to 65%. The left ventricle has normal function. The left ventricle has no regional wall motion abnormalities. There is moderate concentric left ventricular hypertrophy. Left ventricular diastolic parameters are consistent with Grade I diastolic dysfunction (impaired relaxation). The average left ventricular global longitudinal strain is 20.1 %.  The global longitudinal strain is normal.  4. Right ventricular systolic function is normal. The right ventricular size is normal.  5. The mitral valve is normal in structure. Trivial mitral valve regurgitation.  6. The aortic valve is normal in structure. Aortic valve regurgitation is trivial. FINDINGS  Left Ventricle: Left ventricular ejection fraction, by estimation, is 60 to 65%. The left ventricle has normal function. The left ventricle has no regional wall motion abnormalities. The average left ventricular global longitudinal strain is 20.1 %. Strain was performed and the global longitudinal strain is normal. The left ventricular internal cavity size was normal in size. There is moderate concentric left ventricular hypertrophy. Left ventricular diastolic parameters are consistent with Grade I diastolic dysfunction (impaired relaxation). Right Ventricle: The right ventricular size is normal. No increase in right ventricular wall thickness. Right ventricular systolic function is normal.  Left Atrium: Left atrial size was normal in size. Right Atrium: Right atrial size was normal in size. Pericardium: There is no evidence of pericardial effusion. Mitral Valve: The mitral valve is normal in structure. Trivial mitral valve regurgitation. Tricuspid Valve: The tricuspid valve is normal in structure. Tricuspid valve regurgitation is mild. Aortic Valve: The aortic valve is normal in structure. Aortic valve regurgitation is trivial. Aortic valve peak gradient measures 10.9 mmHg. Pulmonic Valve: The pulmonic valve was normal in structure. Pulmonic valve regurgitation is not visualized. Aorta: The ascending aorta was not well visualized. IAS/Shunts: No atrial level shunt detected by color flow Doppler. Additional Comments: Bicspid AoV can not be excluded , no evidence of AS or AI. Consider TEE if any concerns. 3D was performed not requiring image post processing on an independent workstation and was indeterminate.  LEFT  VENTRICLE PLAX 2D LVIDd:         4.50 cm   Diastology LVIDs:         3.00 cm   LV e' medial:    10.10 cm/s LV PW:         1.70 cm   LV E/e' medial:  10.1 LV IVS:        1.40 cm   LV e' lateral:   7.40 cm/s LVOT diam:     2.40 cm   LV E/e' lateral: 13.8 LV SV:         111 LV SV Index:   51        2D Longitudinal Strain LVOT Area:     4.52 cm  2D Strain GLS (A4C):   20.5 %                          2D Strain GLS (A3C):   22.9 %                          2D Strain GLS (A2C):   17.0 %                          2D Strain GLS Avg:     20.1 % RIGHT VENTRICLE RV Basal diam:  2.10 cm RV S prime:     17.10 cm/s TAPSE (M-mode): 2.5 cm LEFT ATRIUM             Index        RIGHT ATRIUM           Index LA diam:        3.70 cm 1.69 cm/m   RA Area:     11.70 cm LA Vol (A2C):   62.2 ml 28.34 ml/m  RA Volume:   22.80 ml  10.39 ml/m LA Vol (A4C):   47.1 ml 21.46 ml/m LA Biplane Vol: 55.5 ml 25.29 ml/m  AORTIC VALVE                 PULMONIC VALVE AV Area (Vmax): 3.32 cm     PV Vmax:        1.14 m/s AV Vmax:        165.00 cm/s  PV Peak grad:   5.2 mmHg AV Peak Grad:   10.9 mmHg    RVOT Peak grad: 5 mmHg LVOT Vmax:      121.00 cm/s LVOT Vmean:     82.900 cm/s LVOT VTI:       0.245 m  AORTA Ao  Root diam: 3.90 cm Ao Asc diam:  3.60 cm MITRAL VALVE MV Area (PHT): 4.01 cm     SHUNTS MV Decel Time: 189 msec     Systemic VTI:  0.24 m MV E velocity: 102.00 cm/s  Systemic Diam: 2.40 cm MV A velocity: 110.00 cm/s MV E/A ratio:  0.93 Cara JONETTA Lovelace MD Electronically signed by Cara JONETTA Lovelace MD Signature Date/Time: 06/28/2024/3:31:43 PM    Final    MR FOOT RIGHT W WO CONTRAST Result Date: 06/26/2024 CLINICAL DATA:  Right foot pain and wounds. Status post second through fifth mid transmetatarsal amputation on 06/05/2024. EXAM: MRI OF THE RIGHT FOREFOOT WITHOUT AND WITH CONTRAST TECHNIQUE: Multiplanar, multisequence MR imaging of the right forefoot was performed before and after the administration of intravenous contrast. CONTRAST:   10mL GADAVIST  GADOBUTROL  1 MMOL/ML IV SOLN COMPARISON:  MRI of the right foot dated 02/16/2024. Right foot radiographs dated 02/21/2024. FINDINGS: Bones/Joint/Cartilage Status post interval second through fifth ray amputation at the level of the mid metatarsal shafts. Associated soft tissue wound extending along the dorsal stump into the deep soft tissues overlying the second through fifth digit metatarsal transsection margins which demonstrate T2/STIR hyperintense marrow edema with corresponding T1 hypointensity. These findings are concerning for osteomyelitis, although, recent postsurgical change can demonstrate a similar appearance. Prior first ray amputation. Ligaments Lisfranc ligament is intact. Muscles and Tendons Diffuse nonspecific increased T2 signal of the intrinsic musculature of the foot, myositis cannot be excluded. Tenosynovitis of the posterior tibialis, flexor digitorum, and flexor hallucis longus tendons. Soft tissue Soft tissue wound with marked edema extending along the dorsal stump into the deep soft tissues overlying the second through fifth digit transmetatarsal transsection margins. No drainable abscess identified. IMPRESSION: 1. Status post interval second through fifth ray amputation at the level of the mid metatarsal shafts. Associated soft tissue wound extending along the dorsal stump into the deep soft tissues overlying the second through fifth digit metatarsal transsection margins which demonstrate T2/STIR hyperintense marrow edema with corresponding T1 hypointensity. These findings are concerning for osteomyelitis of the remnant second through fifth metatarsals, although, recent postsurgical change can demonstrate a similar appearance. Cellulitis cannot be excluded. No drainable abscess. 2. Diffuse nonspecific increased T2 signal of the intrinsic musculature of the foot, myositis cannot be excluded. 3. Tenosynovitis of the posterior tibialis, flexor digitorum, and flexor hallucis  longus tendons. Electronically Signed   By: Harrietta Sherry M.D.   On: 06/26/2024 18:37   MR FOOT LEFT W WO CONTRAST Result Date: 06/22/2024 MR FOOT WITHOUT THEN WITH IV CONTRAST COMPARISON: 06/29/2022 CLINICAL HISTORY: Osteomyelitis. Nonpressured chronic ulcer. PULSE SEQUENCES: Ax T1, Ax T2 FS, Sag T1, Sag T2 FS, Cor STIR, Cor T1 with and without 10 mL of gadolinium based contrast. FINDINGS: There is a chronic large wound in the plantar aspect of the foot which communicates with the second and third metatarsal heads. There is chronic osteomyelitis of the second and third fourth metatarsal heads. There is chronic osteomyelitis sesamoids. There is either postsurgical change or osteomyelitis destroying the distal fifth metatarsal. There is a large wound in the lateral aspect of the midfoot. There is marked myositis is identified in the plantar aspect of the foot. There is a phlegmonous process surrounding the fifth metatarsal without evidence of an obvious drainable collection. IMPRESSION: Large wounds in the plantar aspect of the foot which appear to be communicating with the second through third metatarsal heads. There is chronic osteomyelitis of the second, third and fourth metatarsal heads and involving  the sesamoids. There is destruction of the distal fifth metatarsal head which could be postsurgical or related to progressive osteomyelitis. There is a phlegmonous process surrounding the periosteum of the fifth metatarsal. No clearly defined drainable collection is seen on this examination. Follow-up if surgery is not performed. Electronically signed by: Norleen Satchel MD 06/22/2024 05:46 PM EDT RP Workstation: [id number]   (Echo, Carotid, EGD, Colonoscopy, ERCP)    Subjective: Pt c/o fatigue    Discharge Exam: Vitals:   07/14/24 0405 07/14/24 0726  BP: (!) 114/59 128/83  Pulse: 87 82  Resp: 17 18  Temp: 98.1 F (36.7 C) (!) 97.4 F (36.3 C)  SpO2: 100% 100%   Vitals:   07/13/24 1646 07/13/24  2028 07/14/24 0405 07/14/24 0726  BP: 110/75 118/61 (!) 114/59 128/83  Pulse: 87 82 87 82  Resp: 17 18 17 18   Temp: 98.5 F (36.9 C) 98.3 F (36.8 C) 98.1 F (36.7 C) (!) 97.4 F (36.3 C)  TempSrc:  Oral Oral   SpO2: 100% 100% 100% 100%  Weight:      Height:        General: Pt is alert, awake, not in acute distress Cardiovascular: S1/S2 +, no rubs, no gallops Respiratory: CTA bilaterally, no wheezing, no rhonchi Abdominal: Soft, NT, ND, bowel sounds + Extremities: b/l LE are dressed & dressing are C/D/I.    The results of significant diagnostics from this hospitalization (including imaging, microbiology, ancillary and laboratory) are listed below for reference.     Microbiology: No results found for this or any previous visit (from the past 240 hours).   Labs: BNP (last 3 results) No results for input(s): BNP in the last 8760 hours. Basic Metabolic Panel: Recent Labs  Lab 07/10/24 0525 07/11/24 0453 07/12/24 0415 07/13/24 0448 07/14/24 0500  NA 138 137 138 140 136  K 4.3 4.1 4.2 4.1 4.2  CL 108 104 106 104 107  CO2 25 25 24 25 26   GLUCOSE 215* 181* 163* 180* 205*  BUN 24* 27* 28* 27* 30*  CREATININE 1.35* 1.36* 1.25* 1.31* 1.41*  CALCIUM  8.7* 8.6* 8.6* 8.8* 8.6*   Liver Function Tests: Recent Labs  Lab 07/09/24 0503  AST 12*  ALT 15  ALKPHOS 71  BILITOT 0.6  PROT 7.3  ALBUMIN 2.8*   No results for input(s): LIPASE, AMYLASE in the last 168 hours. No results for input(s): AMMONIA in the last 168 hours. CBC: Recent Labs  Lab 07/09/24 0503 07/10/24 0525 07/11/24 0453 07/12/24 0415 07/13/24 0448 07/14/24 0500  WBC 7.0 6.9 7.7 7.8 8.3 8.2  NEUTROABS 3.4  --   --   --   --   --   HGB 8.7* 8.5* 8.1* 8.5* 8.5* 8.4*  HCT 28.6* 27.3* 27.0* 28.1* 27.3* 27.3*  MCV 80.6 80.3 81.3 80.3 80.3 81.0  PLT 450* 402* 397 384 380 348   Cardiac Enzymes: Recent Labs  Lab 07/11/24 0453  CKTOTAL 220   BNP: Invalid input(s): POCBNP CBG: Recent Labs   Lab 07/13/24 1141 07/13/24 1701 07/13/24 2143 07/14/24 0724 07/14/24 1130  GLUCAP 146* 155* 128* 192* 111*   D-Dimer No results for input(s): DDIMER in the last 72 hours. Hgb A1c No results for input(s): HGBA1C in the last 72 hours. Lipid Profile No results for input(s): CHOL, HDL, LDLCALC, TRIG, CHOLHDL, LDLDIRECT in the last 72 hours. Thyroid  function studies No results for input(s): TSH, T4TOTAL, T3FREE, THYROIDAB in the last 72 hours.  Invalid input(s): FREET3 Anemia work up No results  for input(s): VITAMINB12, FOLATE, FERRITIN, TIBC, IRON , RETICCTPCT in the last 72 hours. Urinalysis    Component Value Date/Time   COLORURINE YELLOW (A) 04/17/2024 1302   APPEARANCEUR CLEAR (A) 04/17/2024 1302   LABSPEC 1.014 04/17/2024 1302   PHURINE 5.0 04/17/2024 1302   GLUCOSEU NEGATIVE 04/17/2024 1302   HGBUR NEGATIVE 04/17/2024 1302   BILIRUBINUR NEGATIVE 04/17/2024 1302   KETONESUR NEGATIVE 04/17/2024 1302   PROTEINUR NEGATIVE 04/17/2024 1302   UROBILINOGEN 0.2 11/10/2010 1558   NITRITE NEGATIVE 04/17/2024 1302   LEUKOCYTESUR NEGATIVE 04/17/2024 1302   Sepsis Labs Recent Labs  Lab 07/11/24 0453 07/12/24 0415 07/13/24 0448 07/14/24 0500  WBC 7.7 7.8 8.3 8.2   Microbiology No results found for this or any previous visit (from the past 240 hours).   Time coordinating discharge: 36 minutes  SIGNED:   Anthony CHRISTELLA Pouch, MD  [facility] 07/14/2024, 12:00 PM Pager   If 7PM-7AM, please contact night-coverage [email or web address]

## 2024-07-16 LAB — LAB REPORT - SCANNED: EGFR: 65

## 2024-07-18 ENCOUNTER — Telehealth: Payer: Self-pay

## 2024-07-18 NOTE — Telephone Encounter (Signed)
 Patient has an appointment with [name] on 07/24/24. Patient currently at [facility] [phone]) in [place].   I spoke with Nathanel with the SNF and advised her of the appointment info and she will relay the information to [facility] who does transportation there. If they are unable to transport the patient [facility] patient aware we can reschedule him after he is discharged on 07/28/24 from the SNF.

## 2024-07-23 ENCOUNTER — Other Ambulatory Visit (HOSPITAL_COMMUNITY): Payer: Self-pay

## 2024-07-23 LAB — LAB REPORT - SCANNED: EGFR: 54

## 2024-07-24 ENCOUNTER — Encounter: Payer: Self-pay | Admitting: Infectious Diseases

## 2024-07-24 ENCOUNTER — Ambulatory Visit: Attending: Infectious Diseases | Admitting: Infectious Diseases

## 2024-07-24 VITALS — BP 133/86 | HR 86 | Temp 97.3°F

## 2024-07-24 DIAGNOSIS — B9620 Unspecified Escherichia coli [E. coli] as the cause of diseases classified elsewhere: Secondary | ICD-10-CM | POA: Insufficient documentation

## 2024-07-24 DIAGNOSIS — Z794 Long term (current) use of insulin: Secondary | ICD-10-CM | POA: Diagnosis not present

## 2024-07-24 DIAGNOSIS — M86672 Other chronic osteomyelitis, left ankle and foot: Secondary | ICD-10-CM | POA: Insufficient documentation

## 2024-07-24 DIAGNOSIS — Z7984 Long term (current) use of oral hypoglycemic drugs: Secondary | ICD-10-CM | POA: Insufficient documentation

## 2024-07-24 DIAGNOSIS — Z993 Dependence on wheelchair: Secondary | ICD-10-CM | POA: Insufficient documentation

## 2024-07-24 DIAGNOSIS — E1169 Type 2 diabetes mellitus with other specified complication: Secondary | ICD-10-CM | POA: Diagnosis not present

## 2024-07-24 DIAGNOSIS — E11628 Type 2 diabetes mellitus with other skin complications: Secondary | ICD-10-CM | POA: Diagnosis not present

## 2024-07-24 DIAGNOSIS — R7881 Bacteremia: Secondary | ICD-10-CM | POA: Diagnosis not present

## 2024-07-24 DIAGNOSIS — T8743 Infection of amputation stump, right lower extremity: Secondary | ICD-10-CM | POA: Insufficient documentation

## 2024-07-24 DIAGNOSIS — B961 Klebsiella pneumoniae [K. pneumoniae] as the cause of diseases classified elsewhere: Secondary | ICD-10-CM | POA: Diagnosis not present

## 2024-07-24 DIAGNOSIS — B9562 Methicillin resistant Staphylococcus aureus infection as the cause of diseases classified elsewhere: Secondary | ICD-10-CM | POA: Diagnosis not present

## 2024-07-24 DIAGNOSIS — E1165 Type 2 diabetes mellitus with hyperglycemia: Secondary | ICD-10-CM | POA: Diagnosis not present

## 2024-07-24 DIAGNOSIS — E1142 Type 2 diabetes mellitus with diabetic polyneuropathy: Secondary | ICD-10-CM | POA: Insufficient documentation

## 2024-07-24 DIAGNOSIS — Z89431 Acquired absence of right foot: Secondary | ICD-10-CM

## 2024-07-24 DIAGNOSIS — L089 Local infection of the skin and subcutaneous tissue, unspecified: Secondary | ICD-10-CM

## 2024-07-24 DIAGNOSIS — E1140 Type 2 diabetes mellitus with diabetic neuropathy, unspecified: Secondary | ICD-10-CM

## 2024-07-24 DIAGNOSIS — E11621 Type 2 diabetes mellitus with foot ulcer: Secondary | ICD-10-CM | POA: Diagnosis present

## 2024-07-24 DIAGNOSIS — F1491 Cocaine use, unspecified, in remission: Secondary | ICD-10-CM | POA: Diagnosis not present

## 2024-07-24 NOTE — Progress Notes (Signed)
 NAME: [name]  DOB: 1966/07/08  MRN: [id number]  Date/Time: 07/24/2024 11:35 AM  Subjective:  Follow up visit for rt foot infection 58 yr male with h/o DM , neuropathy, b/l foot infection Was recently in the hospital 06/26/24-07/14/24 for infected Rt TMA site and had MRSA bacteremia, underwent Revision TMA Polymicrobial infection- sent to SNF on Iv daptomycin  and IV ertapenem  for 6 weeks until 08/06/24 He is doing okay Has gained 25 pounds in the past months due to lack of mobility He is wheel chair bound Has an appt with podiatrist next week  Complicated history  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 by [name]  04/18/24 post op wound dehiscence - underwent debridement MRSA bacteremia  04/23/24 - underwent Rt TMA Treated with 2+ weeks Of Iv dapto/mero- was doing cocaine as OP and was transiently  in CT and missed apptsand then got PICC removed in [place]  06/05/24 dehiscence of wound and underwent delayed primary closure 06/29/24 revision TMA Sent to SNF on 07/14/24 with 6 weeks of Iv dapto and ertapenem  until 9/3    Left foot Chronic left foot wounds since 05/2022 with extensive debridement multiple courses of Iv antibiotics- BKA recommended which he refused- was on a prolonged course of PO antibiotics for osteo--wounds are better with no infections  and not an issue now Recent graft on 06/27/24       Past Medical History:  Diagnosis Date   Amputation of right great toe (HCC)    Cellulitis and abscess of foot    CKD stage 3a, GFR 45-59 ml/min (HCC)    Cocaine abuse (HCC)    + UDS on 05-12-24   DM (diabetes mellitus), type 2 (HCC)    ESBL (extended spectrum beta-lactamase) producing bacteria infection 04/18/2024   Hyperlipidemia    Hypertension    MRSA bacteremia    Normocytic anemia    Obesity    Tobacco abuse    Toe osteomyelitis, left (HCC)    Tuberculosis    tested positive,treated with medications.    Past Surgical History:  Procedure Laterality Date    AMPUTATION TOE Right 03/26/2024   Procedure: AMPUTATION, TOE;  Surgeon: Harden Jerona GAILS, MD;  Location: [facility] OR;  Service: Orthopedics;  Laterality: Right;  RIGHT GREAT TOE AMPUTATION   BONE BIOPSY  04/18/2024   Procedure: BIOPSY, BONE (2ND METATARSAL);  Surgeon: Ashley Soulier, DPM;  Location: [facility] ORS;  Service: Orthopedics/Podiatry;;   FOOT SURGERY Left    I & D EXTREMITY Left 05/05/2022   Procedure: LEFT FOOT DEBRIDEMENT;  Surgeon: Harden Jerona GAILS, MD;  Location: [facility] OR;  Service: Orthopedics;  Laterality: Left;   INCISION AND DRAINAGE OF WOUND Right 06/05/2024   Procedure: DELAYED PRIMARY CLOSURE OF RIGHT FOOT WOUND, EXCISION OF DISTAL METATARSAL 2, 3, 4, and 5;  Surgeon: Ashley Soulier, DPM;  Location: [facility] ORS;  Service: Orthopedics/Podiatry;  Laterality: Right;   IRRIGATION AND DEBRIDEMENT FOOT Right 04/18/2024   Procedure: IRRIGATION AND DEBRIDEMENT FOOT, PLACEMENT OF ANTIBIOTIC BEADS;  Surgeon: Ashley Soulier, DPM;  Location: [facility] ORS;  Service: Orthopedics/Podiatry;  Laterality: Right;   IRRIGATION AND DEBRIDEMENT FOOT Left 06/29/2024   Procedure: IRRIGATION AND DEBRIDEMENT FOOT;  Surgeon: Lennie Barter, DPM;  Location: [facility] ORS;  Service: Orthopedics/Podiatry;  Laterality: Left;   MYRINGOTOMY WITH TUBE PLACEMENT Bilateral 07/20/2022   Procedure: MYRINGOTOMY WITH TUBE PLACEMENT;  Surgeon: Juengel, Paul, MD;  Location: [facility];  Service: ENT;  Laterality: Bilateral;  Diabetic   TEE WITHOUT CARDIOVERSION N/A 07/02/2024  Procedure: ECHOCARDIOGRAM, TRANSESOPHAGEAL;  Surgeon: Perla Evalene PARAS, MD;  Location: [facility] ORS;  Service: Cardiovascular;  Laterality: N/A;   TONSILLECTOMY     TRANSMETATARSAL AMPUTATION Right 04/23/2024   Procedure: AMPUTATION, FOOT, TRANSMETATARSAL;  Surgeon: Ashley Soulier, DPM;  Location: [facility] ORS;  Service: Orthopedics/Podiatry;  Laterality: Right;   TRANSMETATARSAL AMPUTATION Right 06/29/2024   Procedure: AMPUTATION, FOOT, TRANSMETATARSAL;  Surgeon: Lennie Barter,  DPM;  Location: [facility] ORS;  Service: Orthopedics/Podiatry;  Laterality: Right;  RIGHT TRANSMETATARSAL AMPUTATION REVISION    Social History   Socioeconomic History   Marital status: Single    Spouse name: Not on file   Number of children: Not on file   Years of education: Not on file   Highest education level: Not on file  Occupational History   Not on file  Tobacco Use   Smoking status: Former    Current packs/day: 0.00    Average packs/day: 0.3 packs/day for 25.0 years (6.3 ttl pk-yrs)    Types: Cigarettes    Start date: 09/17/1997    Quit date: 09/17/2022    Years since quitting: 1.8   Smokeless tobacco: Never   Tobacco comments:    Quit  Vaping Use   Vaping status: Never Used  Substance and Sexual Activity   Alcohol use: No   Drug use: No   Sexual activity: Yes  Other Topics Concern   Not on file  Social History Narrative   Not on file   Social Drivers of Health   Financial Resource Strain: Patient Declined (06/28/2023)   Received from [place] of the [employer] Strain (CARDIA)    Difficulty of Paying Living Expenses: Patient declined  Food Insecurity: No Food Insecurity (06/26/2024)   Hunger Vital Sign    Worried About Running Out of Food in the Last Year: Never true    Ran Out of Food in the Last Year: Never true  Transportation Needs: No Transportation Needs (06/26/2024)   PRAPARE - Administrator, Civil Service (Medical): No    Lack of Transportation (Non-Medical): No  Physical Activity: Not on file  Stress: Not on file  Social Connections: Moderately Integrated (06/26/2024)   Social Connection and Isolation Panel    Frequency of Communication with Friends and Family: Three times a week    Frequency of Social Gatherings with Friends and Family: Twice a week    Attends Religious Services: 1 to 4 times per year    Active Member of [employer] or Organizations: Yes    Attends Banker Meetings: 1 to 4 times per  year    Marital Status: Never married  Intimate Partner Violence: Not At Risk (06/26/2024)   Humiliation, Afraid, Rape, and Kick questionnaire    Fear of Current or Ex-Partner: No    Emotionally Abused: No    Physically Abused: No    Sexually Abused: No    FH Sister, mother, grandmother - DM Brother- colon cancer  No Known Allergies   Current Outpatient Medications  Medication Sig Dispense Refill   Accu-Chek Softclix Lancets lancets Use to check blood sugar 3 times daily. 100 each 6   ascorbic acid  (VITAMIN C ) 500 MG tablet Take 1 tablet (500 mg total) by mouth daily. 30 tablet 2   bisacodyl  (DULCOLAX) 5 MG EC tablet Take 2 tablets (10 mg total) by mouth at bedtime as needed for moderate constipation. 30 tablet 0   Blood Glucose Monitoring Suppl (ACCU-CHEK GUIDE) w/Device KIT Use to check blood sugar  3 times daily. 1 kit 0   chlorhexidine  (HIBICLENS ) 4 % external liquid Apply 15 mLs (1 Application total) topically as directed for 30 doses. Use as directed daily for 5 days every other week for 6 weeks. 946 mL 1   Continuous Blood Gluc Transmit (DEXCOM G6 TRANSMITTER) MISC Use to check blood sugar three times daily. Change transmitter once every 909 days. E11.69 1 each 1   cyanocobalamin  1000 MCG tablet Take 1 tablet (1,000 mcg total) by mouth daily. 90 tablet 0   daptomycin  (CUBICIN ) IVPB Inject 800 mg into the vein daily. Indication:  MRSA bacteremia and diabetic foot infection Last Day of Therapy:  08/06/24 Labs - Once weekly:  CBC/D, CK, CMP, CRP and ESR Method of administration: IV Push Method of administration may be changed at the discretion of home infusion pharmacist based upon assessment of the patient and/or caregiver's ability to self-administer the medication ordered. --please pull PIC at completion of IV antibiotics Fax weekly lab results promptly to [phone] and [phone] Call [phone] with any critical value 32 Units 0   ertapenem  (INVANZ ) IVPB Inject 1  g into the vein daily. Indication:  MRSA bacteremia and diabetic foot infection Last Day of Therapy:  08/06/24 Labs - Once weekly:  CBC/D, CK, CMP, CRP, ESR Method of administration: Mini-Bag Plus / Gravity Method of administration may be changed at the discretion of home infusion pharmacist based upon assessment of the patient and/or caregiver's ability to self-administer the medication ordered. --please pull PIC at completion of IV antibiotics Fax weekly lab results promptly to [phone] and [phone] Call [phone] with any critical value 32 Units 0   gabapentin  (NEURONTIN ) 300 MG capsule Take 1 capsule (300 mg total) by mouth 2 (two) times daily. 60 capsule 0   glucose blood (ACCU-CHEK GUIDE TEST) test strip Use to check blood sugar 3 times daily. 100 each 6   insulin  aspart (NOVOLOG ) 100 UNIT/ML FlexPen Inject 0-15 Units into the skin 3 (three) times daily before meals.  Correction coverage: Moderate (average weight, post-op) CBG 70 - 120: 0 units CBG 121 - 150: 2 units CBG 151 - 200: 3 units CBG 201 - 250: 5 units CBG 251 - 300: 8 units CBG 301 - 350: 11 units CBG 351 - 400: 15 units CBG > 400: call MD 15 mL 11   Insulin  NPH, Human,, Isophane, (HUMULIN  N KWIKPEN) 100 UNIT/ML Kiwkpen Inject 22 Units into the skin 2 (two) times daily. 30 mL 5   Insulin  Pen Needle 32G X 4 MM MISC Use as directed to inject insulin  up to 4 times daily. 100 each 0   iron  polysaccharides (NIFEREX) 150 MG capsule Take 1 capsule (150 mg total) by mouth daily. 90 capsule 0   losartan  (COZAAR ) 25 MG tablet Take 1 tablet (25 mg total) by mouth daily. (Patient taking differently: Take 25 mg by mouth every morning.) 30 tablet 11   metFORMIN  (GLUCOPHAGE -XR) 500 MG 24 hr tablet Take 2 tablets (1,000 mg total) by mouth 2 (two) times daily with a meal. 120 tablet 1   mupirocin  ointment (BACTROBAN ) 2 % Use as directed into the nose  2 times daily for 5 days every other week for 6 weeks.     nitroGLYCERIN   (NITRODUR - DOSED IN MG/24 HR) 0.2 mg/hr patch Place 1 patch (0.2 mg total) onto the skin daily. 30 patch 12   polyethylene glycol powder (GLYCOLAX /MIRALAX ) 17 GM/SCOOP powder Take 17 g by mouth 2 (two)  times daily. Mix as directed. (Patient taking differently: Take 17 g by mouth 2 (two) times daily as needed for mild constipation. Mix as directed.) 238 g 0   Vitamin D , Ergocalciferol , (DRISDOL ) 1.25 MG (50000 UNIT) CAPS capsule Take 1 capsule (50,000 Units total) by mouth every 7 (seven) days. (Patient taking differently: Take 50,000 Units by mouth every Tuesday.) 12 capsule 0   No current facility-administered medications for this visit.     Abtx:  Anti-infectives (From admission, onward)    None       REVIEW OF SYSTEMS:  Const: negative fever, negative chills, negative weight loss Eyes: negative diplopia or visual changes, negative eye pain ENT: negative coryza, negative sore throat Resp: negative cough, hemoptysis, dyspnea Cards: negative for chest pain, palpitations, lower extremity edema GU: negative for frequency, dysuria and hematuria GI: Negative for abdominal pain, diarrhea, bleeding, constipation Skin: negative for rash and pruritus Heme: negative for easy bruising and gum/nose bleeding FD:ozqu foot ulcer Neurolo:negative for headaches, dizziness, vertigo, memory problems  Psych: negative for feelings of anxiety, depression  Endocrine:  diabetes-  Allergy/Immunology- negative for any medication or food allergies ?  Objective:  VITALS:  BP 133/86   Pulse 86   Temp (!) 97.3 F (36.3 C) (Temporal)   SpO2 96%   PHYSICAL EXAM:  General: Alert, cooperative, no distress, appears stated age.  Lungs: Clear to auscultation bilaterally. No Wheezing or Rhonchi. No rales. Heart: Regular rate and rhythm, no murmur, rub or gallop. Abdomen: Soft, non-tender,not distended. Bowel sounds normal. No masses Extremities:   Rt foot- TMA site has staples- healing well except for  medial end where there is maceration and serous discharge     Left foot 2 wounds- granulation tissue covered with acell-  The top one the graft has been inadvertently removed at the SNF       This is how the left foot looked 05/09/22 05/09/22    Skin: No rashes or lesions. Or bruising Lymph: Cervical, supraclavicular normal. Neurologic: peripheral neuropathy Pertinent Labs CRP 1   ? Impression/Recommendation  Diabetes mellitus with neuropathy and rt foot infection  Rt TMA MRSA bacteremia on 7/24 one set -repeat BC on 7/28 Neg  - TEE neg 07/02/24 On 6 weeks of IV dapto   polymicrobial infection at the rt TMA site for which he underwent revision TMA on 06/29/24-overall healing except one area in the medial end which is macerated Culture MRSA, providencia, klebsiella , ecoli Pathology no osteo HE will complete 6 weeks of IV dapto and Iv ertapenem  on 08/06/24 Follow up with podiatrist next week  DM poorly controlled with peripheral neuropathy  On metformin   Cocaine use- clean now  Chronic wounds left foot  -- stable- had acell graft recently on 7/27   Had bad infection and  osteomyelitis- debridement in June 2023- took 5 months of antibiotic Was told to get amputation, wanted to save his foot at all cost- so transferred care ot [facility] wound center- started hyperbaric oxygen - and got 9 and then stopped as he could not go there 5 times a week.  Completed cefadroxil , levaquin  and flagyl  X 4 months and prior to that nearly 8 weeks of IV Oral antibiotics stopped 10/31/22 The wound looks really good and nice granulation tissue- was getting apligraf at the wound clinic He then moved to [place] in February 2024  to take care of his mother The wound flared up to on his left foot and he was hospitalized at [facility] in July 2024  and was released on vancomycin  and ceftriaxone  and then  p.o. linezolid  and p.o. Augmentin . He completed antibiotics sept/oct 2024 and was off any  antibiotics The left foot wounds are not an issue  But they have not closed completley stable  Discussed the management with the patient Follow up 2-3 weeks

## 2024-08-07 ENCOUNTER — Ambulatory Visit: Admitting: Infectious Diseases

## 2024-08-11 ENCOUNTER — Other Ambulatory Visit (HOSPITAL_COMMUNITY): Payer: Self-pay

## 2024-08-11 ENCOUNTER — Other Ambulatory Visit: Payer: Self-pay

## 2024-08-11 ENCOUNTER — Other Ambulatory Visit: Payer: Self-pay | Admitting: Physician Assistant

## 2024-08-11 DIAGNOSIS — E1169 Type 2 diabetes mellitus with other specified complication: Secondary | ICD-10-CM

## 2024-08-11 MED ORDER — TECHLITE PEN NEEDLES 32G X 4 MM MISC
0 refills | Status: AC
Start: 2024-08-11 — End: ?
  Filled 2024-08-11 – 2024-08-13 (×2): qty 100, 25d supply, fill #0

## 2024-08-11 MED ORDER — LOSARTAN POTASSIUM 25 MG PO TABS
25.0000 mg | ORAL_TABLET | Freq: Every day | ORAL | 0 refills | Status: DC
  Filled 2024-08-11: qty 30, 30d supply, fill #0

## 2024-08-11 MED ORDER — NITROGLYCERIN 0.2 MG/HR TD PT24
0.2000 mg | MEDICATED_PATCH | Freq: Every morning | TRANSDERMAL | 0 refills | Status: DC
  Filled 2024-08-11: qty 30, 30d supply, fill #0

## 2024-08-11 MED ORDER — VITAMIN D3 1.25 MG (50000 UT) PO CAPS
50000.0000 [IU] | ORAL_CAPSULE | ORAL | 0 refills | Status: AC
  Filled 2024-08-11: qty 4, 28d supply, fill #0

## 2024-08-11 MED ORDER — NOVOLOG FLEXPEN 100 UNIT/ML ~~LOC~~ SOPN
PEN_INJECTOR | SUBCUTANEOUS | 0 refills | Status: DC
  Filled 2024-08-11: qty 6, 40d supply, fill #0

## 2024-08-11 MED ORDER — GABAPENTIN 300 MG PO CAPS
300.0000 mg | ORAL_CAPSULE | Freq: Two times a day (BID) | ORAL | 0 refills | Status: AC
  Filled 2024-08-11: qty 60, 30d supply, fill #0

## 2024-08-11 MED ORDER — METFORMIN HCL ER 500 MG PO TB24
1000.0000 mg | ORAL_TABLET | Freq: Two times a day (BID) | ORAL | 0 refills | Status: DC
  Filled 2024-08-11: qty 120, 30d supply, fill #0

## 2024-08-11 MED ORDER — OXYCODONE-ACETAMINOPHEN 5-325 MG PO TABS
1.0000 | ORAL_TABLET | ORAL | 0 refills | Status: DC | PRN
  Filled 2024-08-11: qty 120, 20d supply, fill #0

## 2024-08-11 MED ORDER — HUMULIN N KWIKPEN 100 UNIT/ML ~~LOC~~ SUPN
22.0000 [IU] | PEN_INJECTOR | Freq: Two times a day (BID) | SUBCUTANEOUS | 0 refills | Status: DC
  Filled 2024-08-11: qty 15, 34d supply, fill #0

## 2024-08-12 ENCOUNTER — Other Ambulatory Visit: Payer: Self-pay

## 2024-08-12 ENCOUNTER — Ambulatory Visit: Admitting: Infectious Diseases

## 2024-08-13 ENCOUNTER — Other Ambulatory Visit: Payer: Self-pay

## 2024-08-13 ENCOUNTER — Other Ambulatory Visit (HOSPITAL_COMMUNITY): Payer: Self-pay

## 2024-08-13 ENCOUNTER — Ambulatory Visit (INDEPENDENT_AMBULATORY_CARE_PROVIDER_SITE_OTHER): Admitting: Infectious Diseases

## 2024-08-13 ENCOUNTER — Encounter: Payer: Self-pay | Admitting: Infectious Diseases

## 2024-08-13 VITALS — BP 148/92 | HR 91 | Temp 97.7°F | Wt 266.0 lb

## 2024-08-13 DIAGNOSIS — B9562 Methicillin resistant Staphylococcus aureus infection as the cause of diseases classified elsewhere: Secondary | ICD-10-CM

## 2024-08-13 DIAGNOSIS — R7881 Bacteremia: Secondary | ICD-10-CM | POA: Diagnosis present

## 2024-08-13 DIAGNOSIS — E11628 Type 2 diabetes mellitus with other skin complications: Secondary | ICD-10-CM

## 2024-08-13 DIAGNOSIS — L089 Local infection of the skin and subcutaneous tissue, unspecified: Secondary | ICD-10-CM

## 2024-08-13 NOTE — Progress Notes (Signed)
 NAME: [name]  DOB: [birth year]  MRN: [id number]  Date/Time: 08/13/2024 2:38 PM  Subjective:  Follow up visit for rt foot infection.  Last seen 07/24/2024. 58 yr male with h/o DM , neuropathy, b/l foot infection Was recently in the hospital 06/26/24-07/14/24 for infected Rt TMA site and had MRSA bacteremia, underwent Revision TMA Polymicrobial infection from wound culture.  It was ESBL Klebsiella, Staph aureus, [facility],- sent to SNF on Iv daptomycin  and IV ertapenem  and completed 6 weeks until 08/06/24.  PICC line was removed. He is doing much better The right TMA site is healed almost completely except for a small area on the medial end He is followed by [name] and is waiting for collagen alginate dressings for that area. He is currently on no antibiotics. He has no fever or chills He has got no significant discharge or pain in the foot.  Complicated history  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 by [name]  04/18/24 post op wound dehiscence - underwent debridement MRSA bacteremia  04/23/24 - underwent Rt TMA Treated with 2+ weeks Of Iv dapto/mero- was doing cocaine as OP and was transiently  in CT and missed apptsand then got PICC removed in [place]  06/05/24 dehiscence of wound and underwent delayed primary closure 06/29/24 revision TMA Sent to SNF on 07/14/24 with 6 weeks of Iv dapto and ertapenem  until 9/3    Left foot Chronic left foot wounds since 05/2022 with extensive debridement multiple courses of Iv antibiotics- BKA recommended which he refused- was on a prolonged course of PO antibiotics for osteo--wounds are better with no infections  and not an issue now Recent graft on 06/27/24       Past Medical History:  Diagnosis Date   Amputation of right great toe (HCC)    Cellulitis and abscess of foot    CKD stage 3a, GFR 45-59 ml/min (HCC)    Cocaine abuse (HCC)    + UDS on 05-12-24   DM (diabetes mellitus), type 2 (HCC)    ESBL (extended spectrum  beta-lactamase) producing bacteria infection 04/18/2024   Hyperlipidemia    Hypertension    MRSA bacteremia    Normocytic anemia    Obesity    Tobacco abuse    Toe osteomyelitis, left (HCC)    Tuberculosis    tested positive,treated with medications.    Past Surgical History:  Procedure Laterality Date   AMPUTATION TOE Right 03/26/2024   Procedure: AMPUTATION, TOE;  Surgeon: Harden Jerona GAILS, MD;  Location: [facility] OR;  Service: Orthopedics;  Laterality: Right;  RIGHT GREAT TOE AMPUTATION   BONE BIOPSY  04/18/2024   Procedure: BIOPSY, BONE (2ND METATARSAL);  Surgeon: Ashley Soulier, DPM;  Location: [facility] ORS;  Service: Orthopedics/Podiatry;;   FOOT SURGERY Left    I & D EXTREMITY Left 05/05/2022   Procedure: LEFT FOOT DEBRIDEMENT;  Surgeon: Harden Jerona GAILS, MD;  Location: [facility] OR;  Service: Orthopedics;  Laterality: Left;   INCISION AND DRAINAGE OF WOUND Right 06/05/2024   Procedure: DELAYED PRIMARY CLOSURE OF RIGHT FOOT WOUND, EXCISION OF DISTAL METATARSAL 2, 3, 4, and 5;  Surgeon: Ashley Soulier, DPM;  Location: [facility] ORS;  Service: Orthopedics/Podiatry;  Laterality: Right;   IRRIGATION AND DEBRIDEMENT FOOT Right 04/18/2024   Procedure: IRRIGATION AND DEBRIDEMENT FOOT, PLACEMENT OF ANTIBIOTIC BEADS;  Surgeon: Ashley Soulier, DPM;  Location: [facility] ORS;  Service: Orthopedics/Podiatry;  Laterality: Right;   IRRIGATION AND DEBRIDEMENT FOOT Left 06/29/2024   Procedure: IRRIGATION AND DEBRIDEMENT FOOT;  Surgeon: Lennie,  Prentice, DPM;  Location: [facility] ORS;  Service: Orthopedics/Podiatry;  Laterality: Left;   MYRINGOTOMY WITH TUBE PLACEMENT Bilateral 07/20/2022   Procedure: MYRINGOTOMY WITH TUBE PLACEMENT;  Surgeon: Edda Mt, MD;  Location: [facility];  Service: ENT;  Laterality: Bilateral;  Diabetic   TEE WITHOUT CARDIOVERSION N/A 07/02/2024   Procedure: ECHOCARDIOGRAM, TRANSESOPHAGEAL;  Surgeon: Perla Evalene PARAS, MD;  Location: [facility] ORS;  Service: Cardiovascular;  Laterality: N/A;   TONSILLECTOMY      TRANSMETATARSAL AMPUTATION Right 04/23/2024   Procedure: AMPUTATION, FOOT, TRANSMETATARSAL;  Surgeon: Ashley Soulier, DPM;  Location: [facility] ORS;  Service: Orthopedics/Podiatry;  Laterality: Right;   TRANSMETATARSAL AMPUTATION Right 06/29/2024   Procedure: AMPUTATION, FOOT, TRANSMETATARSAL;  Surgeon: Lennie Prentice, DPM;  Location: [facility] ORS;  Service: Orthopedics/Podiatry;  Laterality: Right;  RIGHT TRANSMETATARSAL AMPUTATION REVISION    Social History   Socioeconomic History   Marital status: Single    Spouse name: Not on file   Number of children: Not on file   Years of education: Not on file   Highest education level: Not on file  Occupational History   Not on file  Tobacco Use   Smoking status: Former    Current packs/day: 0.00    Average packs/day: 0.3 packs/day for 25.0 years (6.3 ttl pk-yrs)    Types: Cigarettes    Start date: 09/17/1997    Quit date: 09/17/2022    Years since quitting: 1.9   Smokeless tobacco: Never   Tobacco comments:    Quit  Vaping Use   Vaping status: Never Used  Substance and Sexual Activity   Alcohol use: No   Drug use: No   Sexual activity: Yes  Other Topics Concern   Not on file  Social History Narrative   Not on file   Social Drivers of Health   Financial Resource Strain: Patient Declined (06/28/2023)   Received from [place] of the [employer] Strain (CARDIA)    Difficulty of Paying Living Expenses: Patient declined  Food Insecurity: No Food Insecurity (06/26/2024)   Hunger Vital Sign    Worried About Running Out of Food in the Last Year: Never true    Ran Out of Food in the Last Year: Never true  Transportation Needs: No Transportation Needs (06/26/2024)   PRAPARE - Administrator, Civil Service (Medical): No    Lack of Transportation (Non-Medical): No  Physical Activity: Not on file  Stress: Not on file  Social Connections: Moderately Integrated (06/26/2024)   Social Connection and  Isolation Panel    Frequency of Communication with Friends and Family: Three times a week    Frequency of Social Gatherings with Friends and Family: Twice a week    Attends Religious Services: 1 to 4 times per year    Active Member of [employer] or Organizations: Yes    Attends Banker Meetings: 1 to 4 times per year    Marital Status: Never married  Intimate Partner Violence: Not At Risk (06/26/2024)   Humiliation, Afraid, Rape, and Kick questionnaire    Fear of Current or Ex-Partner: No    Emotionally Abused: No    Physically Abused: No    Sexually Abused: No    FH Sister, mother, grandmother - DM Brother- colon cancer  No Known Allergies   Current Outpatient Medications  Medication Sig Dispense Refill   Accu-Chek Softclix Lancets lancets Use to check blood sugar 3 times daily. 100 each 6   bisacodyl  (DULCOLAX) 5 MG EC tablet  Take 2 tablets (10 mg total) by mouth at bedtime as needed for moderate constipation. 30 tablet 0   Blood Glucose Monitoring Suppl (ACCU-CHEK GUIDE) w/Device KIT Use to check blood sugar 3 times daily. 1 kit 0   chlorhexidine  (HIBICLENS ) 4 % external liquid Apply 15 mLs (1 Application total) topically as directed for 30 doses. Use as directed daily for 5 days every other week for 6 weeks. 946 mL 1   Cholecalciferol  (VITAMIN D3) 1.25 MG (50000 UT) CAPS Take 1 capsule (50,000 Units total) by mouth every Monday. 4 capsule 0   Continuous Blood Gluc Transmit (DEXCOM G6 TRANSMITTER) MISC Use to check blood sugar three times daily. Change transmitter once every 909 days. E11.69 1 each 1   daptomycin  (CUBICIN ) IVPB Inject 800 mg into the vein daily. Indication:  MRSA bacteremia and diabetic foot infection Last Day of Therapy:  08/06/24 Labs - Once weekly:  CBC/D, CK, CMP, CRP and ESR Method of administration: IV Push Method of administration may be changed at the discretion of home infusion pharmacist based upon assessment of the patient and/or caregiver's  ability to self-administer the medication ordered. --please pull PIC at completion of IV antibiotics Fax weekly lab results promptly to [phone] and [phone] Call [phone] with any critical value 32 Units 0   ertapenem  (INVANZ ) IVPB Inject 1 g into the vein daily. Indication:  MRSA bacteremia and diabetic foot infection Last Day of Therapy:  08/06/24 Labs - Once weekly:  CBC/D, CK, CMP, CRP, ESR Method of administration: Mini-Bag Plus / Gravity Method of administration may be changed at the discretion of home infusion pharmacist based upon assessment of the patient and/or caregiver's ability to self-administer the medication ordered. --please pull PIC at completion of IV antibiotics Fax weekly lab results promptly to [phone] and [phone] Call [phone] with any critical value 32 Units 0   gabapentin  (NEURONTIN ) 300 MG capsule Take 1 capsule (300 mg total) by mouth 2 (two) times daily. 60 capsule 0   gabapentin  (NEURONTIN ) 300 MG capsule Take 1 capsule (300 mg total) by mouth 2 (two) times daily for neuropathy pain. 60 capsule 0   glucose blood (ACCU-CHEK GUIDE TEST) test strip Use to check blood sugar 3 times daily. 100 each 6   insulin  aspart (NOVOLOG  FLEXPEN) 100 UNIT/ML FlexPen INJECT PER SLIDING SCALE BEFORE MEALS: 121-150 = 2 UNITS, 151-200= 3 UNITS , 201-250= 5 UNITS, 251-300= 8 UNITS, 301-350= 11 UNITS , 351-400= 15 UNITS . ABOVE 400 CALL MEDICAL DOCTOR! 6 mL 0   insulin  aspart (NOVOLOG ) 100 UNIT/ML FlexPen Inject 0-15 Units into the skin 3 (three) times daily before meals.  Correction coverage: Moderate (average weight, post-op) CBG 70 - 120: 0 units CBG 121 - 150: 2 units CBG 151 - 200: 3 units CBG 201 - 250: 5 units CBG 251 - 300: 8 units CBG 301 - 350: 11 units CBG 351 - 400: 15 units CBG > 400: call MD 15 mL 11   Insulin  NPH, Human,, Isophane, (HUMULIN  N KWIKPEN) 100 UNIT/ML Kiwkpen Inject 22 Units into the skin 2 (two) times daily. 30 mL 5    Insulin  NPH, Human,, Isophane, (HUMULIN  N KWIKPEN) 100 UNIT/ML Kiwkpen Inject 22 Units into the skin 2 (two) times daily. 15 mL 0   Insulin  Pen Needle (TECHLITE PEN NEEDLES) 32G X 4 MM MISC Use as directed to inject insulin  up to 4 times daily. 100 each 0   iron  polysaccharides (NIFEREX) 150 MG capsule Take  1 capsule (150 mg total) by mouth daily. 90 capsule 0   losartan  (COZAAR ) 25 MG tablet Take 1 tablet (25 mg total) by mouth daily. (Patient taking differently: Take 25 mg by mouth every morning.) 30 tablet 11   losartan  (COZAAR ) 25 MG tablet Take 1 tablet (25 mg total) by mouth daily for hypertension. 30 tablet 0   metFORMIN  (GLUCOPHAGE -XR) 500 MG 24 hr tablet Take 2 tablets (1,000 mg total) by mouth 2 (two) times daily with a meal. 120 tablet 1   metFORMIN  (GLUCOPHAGE -XR) 500 MG 24 hr tablet Take 2 tablets (1,000 mg total) by mouth 2 (two) times daily with a meal. 120 tablet 0   mupirocin  ointment (BACTROBAN ) 2 % Use as directed into the nose  2 times daily for 5 days every other week for 6 weeks.     nitroGLYCERIN  (NITRODUR - DOSED IN MG/24 HR) 0.2 mg/hr patch Place 1 patch (0.2 mg total) onto the skin daily. 30 patch 12   nitroGLYCERIN  (NITRODUR - DOSED IN MG/24 HR) 0.2 mg/hr patch Place 1 patch (0.2 mg total) onto the skin in the morning & remove at bedtime (remove per schedule) 30 patch 0   oxyCODONE -acetaminophen  (PERCOCET/ROXICET) 5-325 MG tablet Take 1 tablet by mouth every 4 (four) hours as needed for pain. 120 tablet 0   polyethylene glycol powder (GLYCOLAX /MIRALAX ) 17 GM/SCOOP powder Take 17 g by mouth 2 (two) times daily. Mix as directed. (Patient taking differently: Take 17 g by mouth 2 (two) times daily as needed for mild constipation. Mix as directed.) 238 g 0   No current facility-administered medications for this visit.     Abtx:  Anti-infectives (From admission, onward)    None       REVIEW OF SYSTEMS:  Const: negative fever, negative chills, negative weight loss Eyes:  negative diplopia or visual changes, negative eye pain ENT: negative coryza, negative sore throat Resp: negative cough, hemoptysis, dyspnea Cards: negative for chest pain, palpitations, lower extremity edema GU: negative for frequency, dysuria and hematuria GI: Negative for abdominal pain, diarrhea, bleeding, constipation Skin: negative for rash and pruritus Heme: negative for easy bruising and gum/nose bleeding FD:ozqu foot ulcer Neurolo:negative for headaches, dizziness, vertigo, memory problems  Psych: negative for feelings of anxiety, depression  Endocrine:  diabetes-  Allergy/Immunology- negative for any medication or food allergies ?  Objective:  VITALS:  BP (!) 148/92   Pulse 91   Temp 97.7 F (36.5 C) (Oral)   Wt 266 lb (120.7 kg)   BMI 38.17 kg/m   PHYSICAL EXAM:  General: Alert, cooperative, no distress, appears stated age.  Lungs: Clear to auscultation bilaterally. No Wheezing or Rhonchi. No rales. Heart: Regular rate and rhythm, no murmur, rub or gallop. Abdomen: Soft, non-tender,not distended. Bowel sounds normal. No masses Extremities:         I did not look at the left foot He says he is much better    Skin: No rashes or lesions. Or bruising Lymph: Cervical, supraclavicular normal. Neurologic: peripheral neuropathy    ? Impression/Recommendation  Diabetes mellitus with neuropathy and rt foot infection  Rt TMA MRSA bacteremia on 7/24 one set -repeat BC on 7/28 Neg  - TEE neg 07/02/24 Completed 6 weeks of IV daptomycin .   polymicrobial infection at the rt TMA site for which he underwent revision TMA on 06/29/24-overall healing very well except one area in the medial end which is macerated Culture MRSA, providencia, klebsiella , ecoli Pathology no osteo completed 6 weeks of IV  dapto and Iv ertapenem  on 08/06/24 Follow up with podiatrist The wound has healed very well except 1 small area for which she is waiting to have collagen. Does not need any  more antibiotics.  DM peripheral neuropathy  On metformin   Cocaine use- clean now  Chronic wounds left foot  -- stable- had acell graft recently on 7/27  Had bad infection and  osteomyelitis- debridement in June 2023- took 5 months of antibiotic Was told to get amputation, wanted to save his foot at all cost- so transferred care ot [facility] wound center- started hyperbaric oxygen - and got 9 and then stopped as he could not go there 5 times a week.  Completed cefadroxil , levaquin  and flagyl  X 4 months and prior to that nearly 8 weeks of IV Oral antibiotics stopped 10/31/22 The wound looks really good and nice granulation tissue- was getting apligraf at the wound clinic He then moved to [place] in February 2024  to take care of his mother The wound flared up to on his left foot and he was hospitalized at [facility] in July 2024  and was released on vancomycin  and ceftriaxone  and then  p.o. linezolid  and p.o. Augmentin . He completed antibiotics sept/oct 2024 and was off any antibiotics The left foot wounds are not an issue  But they have not closed completley stable  Discussed the management with the patient Follow-up as needed

## 2024-08-13 NOTE — Patient Instructions (Signed)
 Today, we discussed the progress of the wound on your right foot. The wound is healing well, with only a small area needing additional treatment. You are currently using a special shoe to aid in the healing process and changing the dressing every two days, which can be extended to three days due to the lack of drainage. We also talked about the collagen treatment you are using and the delay in receiving your order.  YOUR PLAN:  -CHRONIC RIGHT FOOT WOUND WITH SKIN GRAFT: A chronic wound is one that takes longer to heal, often due to underlying conditions. Your wound is healing well, with skin covering the graft and no signs of infection. You should continue to use the collagen alginate dressing mixed with saline and apply it to the wound every two to three days, depending on drainage. Please bring a piece of the collagen dressing to your office visits for application. Adjust the frequency of dressing changes based on the amount of drainage.  INSTRUCTIONS:  Please follow up with the office if you notice any signs of infection, such as increased redness, swelling, or drainage. Continue to monitor the wound and adjust the dressing frequency as needed. Bring a piece of the collagen dressing to your next visit for application.

## 2024-08-14 ENCOUNTER — Ambulatory Visit: Admitting: Infectious Diseases

## 2024-08-20 ENCOUNTER — Ambulatory Visit: Admitting: Nurse Practitioner

## 2024-09-01 ENCOUNTER — Ambulatory Visit: Admitting: Family Medicine

## 2024-09-01 NOTE — Progress Notes (Deleted)
 Patient ID: [name], male    DOB: 11-07-1966  MRN: [id number]  CC: No chief complaint on file.   Subjective: [name] is a 58 y.o. male with history of HTN, dyslipidemia, tobacco use, stage IIIa CKD, cocaine use, and type 2 DM presents for chronic ds management.  Hospital d/c summary from 07/14/2024:  Patient presented to the ED 06/26/2024 with right foot pain and drainage from recent right revision transmetatarsal amputation. Vitals were stable and he was asymptomatic. Labs showed hyperglycemia (345), Cr 1.42, albumin 2.8, leukocytosis (12.3), anemia (Hgb 8.7), and thrombocytosis (493). He received IV ciprofloxacin , morphine , and Zofran  and was admitted for further management. Surgical wound cultures came back positive for pansensitive E coli w/o evidence of osteomyelitis on path. MRI suggested possible osteomyelitis of the 2nd-5th metatarsals, with concern for cellulitis, myositis, and tenosynovitis but no abscessWas started on IV ertapenem , dapto until 08/06/24.  Discharged with instructions to f/u with [name] in podiatry.   DM2: likely poorly controlled. Restart home anti-DM meds at d/c. Has Dexcom    CKDIIIa: Cr is labile. Avoid nephrotoxic meds     ACD: likely secondary to CKD. H&H are stable    HTN: continue on home dose of losartan     HLD: continue on statin    Thrombocytosis: WNL today     Patient Active Problem List   Diagnosis Date Noted  . 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/02/2024  . Wound infection 07/01/2024  . Essential hypertension 06/27/2024  . Dyslipidemia 06/27/2024  . Uncontrolled type 2 diabetes mellitus with hyperglycemia, with long-term current use of insulin  (HCC) 06/27/2024  . Acute osteomyelitis of right foot (HCC) 06/26/2024  . MRSA bacteremia 04/21/2024  . HLD (hyperlipidemia) 04/17/2024  . Chronic kidney disease, stage 3a (HCC) 04/17/2024  . 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  . Normocytic anemia 04/17/2024  . Amputation of  right great toe 03/31/2024  . Necrotizing soft tissue infection 03/26/2024  . Hypertension associated with diabetes (HCC) 12/12/2022  . Toe osteomyelitis, left (HCC) 12/12/2022  . Pyogenic inflammation of bone (HCC) 06/02/2022  . PICC (peripherally inserted central catheter) in place 06/02/2022  . Medication management 06/02/2022  . Diabetic foot infection (HCC) 06/02/2022  . Smoking 06/02/2022  . Tobacco abuse 05/06/2022  . Hyperkalemia 05/06/2022  . Type 2 diabetes mellitus (HCC) 05/05/2022  . Obesity (BMI 30-39.9) 05/05/2022  . Cellulitis and abscess of foot   . AKI (acute kidney injury) 05/04/2022  . Injury of left foot   . Diabetic foot ulcer (HCC) 12/14/2021     Current Outpatient Medications on File Prior to Visit  Medication Sig Dispense Refill  . Accu-Chek Softclix Lancets lancets Use to check blood sugar 3 times daily. 100 each 6  . bisacodyl  (DULCOLAX) 5 MG EC tablet Take 2 tablets (10 mg total) by mouth at bedtime as needed for moderate constipation. 30 tablet 0  . Blood Glucose Monitoring Suppl (ACCU-CHEK GUIDE) w/Device KIT Use to check blood sugar 3 times daily. 1 kit 0  . chlorhexidine  (HIBICLENS ) 4 % external liquid Apply 15 mLs (1 Application total) topically as directed for 30 doses. Use as directed daily for 5 days every other week for 6 weeks. 946 mL 1  . Cholecalciferol  (VITAMIN D3) 1.25 MG (50000 UT) CAPS Take 1 capsule (50,000 Units total) by mouth every Monday. 4 capsule 0  . Continuous Blood Gluc Transmit (DEXCOM G6 TRANSMITTER) MISC Use to check blood sugar three times daily. Change transmitter once every 909 days. E11.69 1  each 1  . gabapentin  (NEURONTIN ) 300 MG capsule Take 1 capsule (300 mg total) by mouth 2 (two) times daily. 60 capsule 0  . gabapentin  (NEURONTIN ) 300 MG capsule Take 1 capsule (300 mg total) by mouth 2 (two) times daily for neuropathy pain. 60 capsule 0  . glucose blood (ACCU-CHEK GUIDE TEST) test strip Use to check blood sugar 3 times  daily. 100 each 6  . insulin  aspart (NOVOLOG  FLEXPEN) 100 UNIT/ML FlexPen INJECT PER SLIDING SCALE BEFORE MEALS: 121-150 = 2 UNITS, 151-200= 3 UNITS , 201-250= 5 UNITS, 251-300= 8 UNITS, 301-350= 11 UNITS , 351-400= 15 UNITS . ABOVE 400 CALL MEDICAL DOCTOR! 6 mL 0  . insulin  aspart (NOVOLOG ) 100 UNIT/ML FlexPen Inject 0-15 Units into the skin 3 (three) times daily before meals.  Correction coverage: Moderate (average weight, post-op) CBG 70 - 120: 0 units CBG 121 - 150: 2 units CBG 151 - 200: 3 units CBG 201 - 250: 5 units CBG 251 - 300: 8 units CBG 301 - 350: 11 units CBG 351 - 400: 15 units CBG > 400: call MD 15 mL 11  . Insulin  NPH, Human,, Isophane, (HUMULIN  N KWIKPEN) 100 UNIT/ML Kiwkpen Inject 22 Units into the skin 2 (two) times daily. 30 mL 5  . Insulin  NPH, Human,, Isophane, (HUMULIN  N KWIKPEN) 100 UNIT/ML Kiwkpen Inject 22 Units into the skin 2 (two) times daily. 15 mL 0  . Insulin  Pen Needle (TECHLITE PEN NEEDLES) 32G X 4 MM MISC Use as directed to inject insulin  up to 4 times daily. 100 each 0  . iron  polysaccharides (NIFEREX) 150 MG capsule Take 1 capsule (150 mg total) by mouth daily. 90 capsule 0  . losartan  (COZAAR ) 25 MG tablet Take 1 tablet (25 mg total) by mouth daily. (Patient taking differently: Take 25 mg by mouth every morning.) 30 tablet 11  . losartan  (COZAAR ) 25 MG tablet Take 1 tablet (25 mg total) by mouth daily for hypertension. 30 tablet 0  . metFORMIN  (GLUCOPHAGE -XR) 500 MG 24 hr tablet Take 2 tablets (1,000 mg total) by mouth 2 (two) times daily with a meal. 120 tablet 1  . metFORMIN  (GLUCOPHAGE -XR) 500 MG 24 hr tablet Take 2 tablets (1,000 mg total) by mouth 2 (two) times daily with a meal. 120 tablet 0  . nitroGLYCERIN  (NITRODUR - DOSED IN MG/24 HR) 0.2 mg/hr patch Place 1 patch (0.2 mg total) onto the skin daily. 30 patch 12  . nitroGLYCERIN  (NITRODUR - DOSED IN MG/24 HR) 0.2 mg/hr patch Place 1 patch (0.2 mg total) onto the skin in the morning & remove at  bedtime (remove per schedule) 30 patch 0  . oxyCODONE -acetaminophen  (PERCOCET/ROXICET) 5-325 MG tablet Take 1 tablet by mouth every 4 (four) hours as needed for pain. 120 tablet 0  . polyethylene glycol powder (GLYCOLAX /MIRALAX ) 17 GM/SCOOP powder Take 17 g by mouth 2 (two) times daily. Mix as directed. (Patient taking differently: Take 17 g by mouth 2 (two) times daily as needed for mild constipation. Mix as directed.) 238 g 0   No current facility-administered medications on file prior to visit.    No Known Allergies  Social History   Socioeconomic History  . Marital status: Single    Spouse name: Not on file  . Number of children: Not on file  . Years of education: Not on file  . Highest education level: Not on file  Occupational History  . Not on file  Tobacco Use  . Smoking status: Former  Current packs/day: 0.00    Average packs/day: 0.3 packs/day for 25.0 years (6.3 ttl pk-yrs)    Types: Cigarettes    Start date: 09/17/1997    Quit date: 09/17/2022    Years since quitting: 1.9  . Smokeless tobacco: Never  . Tobacco comments:    Quit  Vaping Use  . Vaping status: Never Used  Substance and Sexual Activity  . Alcohol use: No  . Drug use: No  . Sexual activity: Yes  Other Topics Concern  . Not on file  Social History Narrative  . Not on file   Social Drivers of Health   Financial Resource Strain: Patient Declined (06/28/2023)   Received from [employer] Strain (CARDIA)   . Difficulty of Paying Living Expenses: Patient declined  Food Insecurity: No Food Insecurity (06/26/2024)   Hunger Vital Sign   . Worried About Programme researcher, broadcasting/film/video in the Last Year: Never true   . Ran Out of Food in the Last Year: Never true  Transportation Needs: No Transportation Needs (06/26/2024)   PRAPARE - Transportation   . Lack of Transportation (Medical): No   . Lack of Transportation (Non-Medical): No  Physical Activity: Not on file   Stress: Not on file  Social Connections: Moderately Integrated (06/26/2024)   Social Connection and Isolation Panel   . Frequency of Communication with Friends and Family: Three times a week   . Frequency of Social Gatherings with Friends and Family: Twice a week   . Attends Religious Services: 1 to 4 times per year   . Active Member of Clubs or Organizations: Yes   . Attends Banker Meetings: 1 to 4 times per year   . Marital Status: Never married  Intimate Partner Violence: Not At Risk (06/26/2024)   Humiliation, Afraid, Rape, and Kick questionnaire   . Fear of Current or Ex-Partner: No   . Emotionally Abused: No   . Physically Abused: No   . Sexually Abused: No    Family History  Problem Relation Age of Onset  . Colon cancer Neg Hx   . Colon polyps Neg Hx   . Crohn's disease Neg Hx   . Esophageal cancer Neg Hx   . Rectal cancer Neg Hx   . Stomach cancer Neg Hx   . Ulcerative colitis Neg Hx     Past Surgical History:  Procedure Laterality Date  . AMPUTATION TOE Right 03/26/2024   Procedure: AMPUTATION, TOE;  Surgeon: Harden Jerona GAILS, MD;  Location: [facility] OR;  Service: Orthopedics;  Laterality: Right;  RIGHT GREAT TOE AMPUTATION  . BONE BIOPSY  04/18/2024   Procedure: BIOPSY, BONE (2ND METATARSAL);  Surgeon: Ashley Soulier, DPM;  Location: [facility] ORS;  Service: Orthopedics/Podiatry;;  . FOOT SURGERY Left   . I & D EXTREMITY Left 05/05/2022   Procedure: LEFT FOOT DEBRIDEMENT;  Surgeon: Harden Jerona GAILS, MD;  Location: [facility] OR;  Service: Orthopedics;  Laterality: Left;  . INCISION AND DRAINAGE OF WOUND Right 06/05/2024   Procedure: DELAYED PRIMARY CLOSURE OF RIGHT FOOT WOUND, EXCISION OF DISTAL METATARSAL 2, 3, 4, and 5;  Surgeon: Ashley Soulier, DPM;  Location: [facility] ORS;  Service: Orthopedics/Podiatry;  Laterality: Right;  . IRRIGATION AND DEBRIDEMENT FOOT Right 04/18/2024   Procedure: IRRIGATION AND DEBRIDEMENT FOOT, PLACEMENT OF ANTIBIOTIC BEADS;  Surgeon: Ashley Soulier, DPM;   Location: [facility] ORS;  Service: Orthopedics/Podiatry;  Laterality: Right;  . IRRIGATION AND DEBRIDEMENT FOOT Left 06/29/2024   Procedure: IRRIGATION AND  DEBRIDEMENT FOOT;  Surgeon: Lennie Barter, DPM;  Location: [facility] ORS;  Service: Orthopedics/Podiatry;  Laterality: Left;  . MYRINGOTOMY WITH TUBE PLACEMENT Bilateral 07/20/2022   Procedure: MYRINGOTOMY WITH TUBE PLACEMENT;  Surgeon: Juengel, Paul, MD;  Location: [facility];  Service: ENT;  Laterality: Bilateral;  Diabetic  . TEE WITHOUT CARDIOVERSION N/A 07/02/2024   Procedure: ECHOCARDIOGRAM, TRANSESOPHAGEAL;  Surgeon: Perla Evalene PARAS, MD;  Location: [facility] ORS;  Service: Cardiovascular;  Laterality: N/A;  . TONSILLECTOMY    . TRANSMETATARSAL AMPUTATION Right 04/23/2024   Procedure: AMPUTATION, FOOT, TRANSMETATARSAL;  Surgeon: Ashley Soulier, DPM;  Location: [facility] ORS;  Service: Orthopedics/Podiatry;  Laterality: Right;  . TRANSMETATARSAL AMPUTATION Right 06/29/2024   Procedure: AMPUTATION, FOOT, TRANSMETATARSAL;  Surgeon: Lennie Barter, DPM;  Location: [facility] ORS;  Service: Orthopedics/Podiatry;  Laterality: Right;  RIGHT TRANSMETATARSAL AMPUTATION REVISION    ROS: Review of Systems Negative except as stated above  PHYSICAL EXAM: There were no vitals taken for this visit.  Physical Exam  {male adult master:[id number]} {male adult master:310785}     Latest Ref Rng & Units 07/14/2024    5:00 AM 07/13/2024    4:48 AM 07/12/2024    4:15 AM  CMP  Glucose 70 - 99 mg/dL 794  819  836   BUN 6 - 20 mg/dL 30  27  28    Creatinine 0.61 - 1.24 mg/dL 8.58  8.68  8.74   Sodium 135 - 145 mmol/L 136  140  138   Potassium 3.5 - 5.1 mmol/L 4.2  4.1  4.2   Chloride 98 - 111 mmol/L 107  104  106   CO2 22 - 32 mmol/L 26  25  24    Calcium  8.9 - 10.3 mg/dL 8.6  8.8  8.6    Lipid Panel     Component Value Date/Time   CHOL 111 05/11/2024 2130   TRIG 106 05/11/2024 2130   HDL 28 (L) 05/11/2024 2130   CHOLHDL 4.0 05/11/2024 2130   VLDL 21 05/11/2024  2130   LDLCALC 62 05/11/2024 2130    CBC    Component Value Date/Time   WBC 8.2 07/14/2024 0500   RBC 3.37 (L) 07/14/2024 0500   HGB 8.4 (L) 07/14/2024 0500   HCT 27.3 (L) 07/14/2024 0500   PLT 348 07/14/2024 0500   MCV 81.0 07/14/2024 0500   MCH 24.9 (L) 07/14/2024 0500   MCHC 30.8 07/14/2024 0500   RDW 15.9 (H) 07/14/2024 0500   LYMPHSABS 3.0 07/09/2024 0503   MONOABS 0.5 07/09/2024 0503   EOSABS 0.2 07/09/2024 0503   BASOSABS 0.0 07/09/2024 0503    ASSESSMENT AND PLAN:  Assessment and Plan      There are no diagnoses linked to this encounter.   Patient was given the opportunity to ask questions.  Patient verbalized understanding of the plan and was able to repeat key elements of the plan.   This documentation was completed using Paediatric nurse.  Any transcriptional errors are unintentional.  No orders of the defined types were placed in this encounter.    Requested Prescriptions    No prescriptions requested or ordered in this encounter    No follow-ups on file.  This is a Psychologist, occupational Note.  The care of the patient was discussed with Dr. Newlin and the assessment and plan formulated with her assistance.  Please see her attestation of this encounter.  Duwaine Amber, MS3 [facility]

## 2024-09-03 ENCOUNTER — Other Ambulatory Visit (HOSPITAL_COMMUNITY): Payer: Self-pay

## 2024-09-26 ENCOUNTER — Encounter (HOSPITAL_COMMUNITY): Payer: Self-pay

## 2024-09-26 ENCOUNTER — Emergency Department (HOSPITAL_COMMUNITY)

## 2024-09-26 ENCOUNTER — Other Ambulatory Visit (HOSPITAL_COMMUNITY): Payer: Self-pay

## 2024-09-26 ENCOUNTER — Other Ambulatory Visit: Payer: Self-pay

## 2024-09-26 ENCOUNTER — Inpatient Hospital Stay (HOSPITAL_COMMUNITY)
Admission: EM | Admit: 2024-09-26 | Discharge: 2024-09-29 | DRG: 638 | Disposition: A | Attending: Family Medicine | Admitting: Family Medicine

## 2024-09-26 DIAGNOSIS — D649 Anemia, unspecified: Secondary | ICD-10-CM | POA: Diagnosis present

## 2024-09-26 DIAGNOSIS — E669 Obesity, unspecified: Secondary | ICD-10-CM | POA: Diagnosis present

## 2024-09-26 DIAGNOSIS — E785 Hyperlipidemia, unspecified: Secondary | ICD-10-CM | POA: Diagnosis present

## 2024-09-26 DIAGNOSIS — E1165 Type 2 diabetes mellitus with hyperglycemia: Secondary | ICD-10-CM | POA: Diagnosis not present

## 2024-09-26 DIAGNOSIS — I959 Hypotension, unspecified: Secondary | ICD-10-CM | POA: Diagnosis present

## 2024-09-26 DIAGNOSIS — N1831 Chronic kidney disease, stage 3a: Secondary | ICD-10-CM | POA: Diagnosis present

## 2024-09-26 DIAGNOSIS — Z79899 Other long term (current) drug therapy: Secondary | ICD-10-CM

## 2024-09-26 DIAGNOSIS — E860 Dehydration: Secondary | ICD-10-CM | POA: Diagnosis present

## 2024-09-26 DIAGNOSIS — E1140 Type 2 diabetes mellitus with diabetic neuropathy, unspecified: Secondary | ICD-10-CM | POA: Diagnosis present

## 2024-09-26 DIAGNOSIS — T148XXA Other injury of unspecified body region, initial encounter: Secondary | ICD-10-CM

## 2024-09-26 DIAGNOSIS — Z8614 Personal history of Methicillin resistant Staphylococcus aureus infection: Secondary | ICD-10-CM

## 2024-09-26 DIAGNOSIS — L03116 Cellulitis of left lower limb: Secondary | ICD-10-CM | POA: Diagnosis present

## 2024-09-26 DIAGNOSIS — Z9181 History of falling: Secondary | ICD-10-CM

## 2024-09-26 DIAGNOSIS — L97529 Non-pressure chronic ulcer of other part of left foot with unspecified severity: Secondary | ICD-10-CM | POA: Diagnosis present

## 2024-09-26 DIAGNOSIS — N179 Acute kidney failure, unspecified: Secondary | ICD-10-CM | POA: Diagnosis present

## 2024-09-26 DIAGNOSIS — F1410 Cocaine abuse, uncomplicated: Secondary | ICD-10-CM | POA: Diagnosis present

## 2024-09-26 DIAGNOSIS — M25561 Pain in right knee: Secondary | ICD-10-CM | POA: Diagnosis present

## 2024-09-26 DIAGNOSIS — M109 Gout, unspecified: Secondary | ICD-10-CM | POA: Diagnosis present

## 2024-09-26 DIAGNOSIS — Z89411 Acquired absence of right great toe: Secondary | ICD-10-CM

## 2024-09-26 DIAGNOSIS — E1169 Type 2 diabetes mellitus with other specified complication: Principal | ICD-10-CM | POA: Diagnosis present

## 2024-09-26 DIAGNOSIS — Z794 Long term (current) use of insulin: Secondary | ICD-10-CM

## 2024-09-26 DIAGNOSIS — L089 Local infection of the skin and subcutaneous tissue, unspecified: Secondary | ICD-10-CM

## 2024-09-26 DIAGNOSIS — M869 Osteomyelitis, unspecified: Principal | ICD-10-CM

## 2024-09-26 DIAGNOSIS — Z87891 Personal history of nicotine dependence: Secondary | ICD-10-CM

## 2024-09-26 DIAGNOSIS — Z5320 Procedure and treatment not carried out because of patient's decision for unspecified reasons: Secondary | ICD-10-CM | POA: Diagnosis present

## 2024-09-26 DIAGNOSIS — Z89431 Acquired absence of right foot: Secondary | ICD-10-CM

## 2024-09-26 DIAGNOSIS — E1159 Type 2 diabetes mellitus with other circulatory complications: Secondary | ICD-10-CM | POA: Diagnosis present

## 2024-09-26 DIAGNOSIS — I152 Hypertension secondary to endocrine disorders: Secondary | ICD-10-CM | POA: Diagnosis present

## 2024-09-26 DIAGNOSIS — Z7984 Long term (current) use of oral hypoglycemic drugs: Secondary | ICD-10-CM

## 2024-09-26 DIAGNOSIS — M86672 Other chronic osteomyelitis, left ankle and foot: Secondary | ICD-10-CM | POA: Diagnosis present

## 2024-09-26 DIAGNOSIS — N170 Acute kidney failure with tubular necrosis: Secondary | ICD-10-CM | POA: Diagnosis present

## 2024-09-26 DIAGNOSIS — E1122 Type 2 diabetes mellitus with diabetic chronic kidney disease: Secondary | ICD-10-CM | POA: Diagnosis present

## 2024-09-26 LAB — BASIC METABOLIC PANEL WITH GFR
Anion gap: 11 (ref 5–15)
BUN: 26 mg/dL — ABNORMAL HIGH (ref 6–20)
CO2: 22 mmol/L (ref 22–32)
Calcium: 8.3 mg/dL — ABNORMAL LOW (ref 8.9–10.3)
Chloride: 98 mmol/L (ref 98–111)
Creatinine, Ser: 2.73 mg/dL — ABNORMAL HIGH (ref 0.61–1.24)
GFR, Estimated: 26 mL/min — ABNORMAL LOW (ref >60)
Glucose, Bld: 281 mg/dL — ABNORMAL HIGH (ref 70–99)
Potassium: 3.7 mmol/L (ref 3.5–5.1)
Sodium: 131 mmol/L — ABNORMAL LOW (ref 135–145)

## 2024-09-26 LAB — RAPID URINE DRUG SCREEN, HOSP PERFORMED
Amphetamines: NOT DETECTED
Barbiturates: NOT DETECTED
Benzodiazepines: NOT DETECTED
Cocaine: POSITIVE — AB
Opiates: POSITIVE — AB
Tetrahydrocannabinol: NOT DETECTED

## 2024-09-26 LAB — CBC WITH DIFFERENTIAL/PLATELET
Abs Immature Granulocytes: 0.1 K/uL — ABNORMAL HIGH (ref 0.00–0.07)
Basophils Absolute: 0 K/uL (ref 0.0–0.1)
Basophils Relative: 0 %
Eosinophils Absolute: 0 K/uL (ref 0.0–0.5)
Eosinophils Relative: 0 %
HCT: 34.3 % — ABNORMAL LOW (ref 39.0–52.0)
Hemoglobin: 10.6 g/dL — ABNORMAL LOW (ref 13.0–17.0)
Immature Granulocytes: 1 %
Lymphocytes Relative: 9 %
Lymphs Abs: 1.4 K/uL (ref 0.7–4.0)
MCH: 24.6 pg — ABNORMAL LOW (ref 26.0–34.0)
MCHC: 30.9 g/dL (ref 30.0–36.0)
MCV: 79.6 fL — ABNORMAL LOW (ref 80.0–100.0)
Monocytes Absolute: 1.4 K/uL — ABNORMAL HIGH (ref 0.1–1.0)
Monocytes Relative: 9 %
Neutro Abs: 12.5 K/uL — ABNORMAL HIGH (ref 1.7–7.7)
Neutrophils Relative %: 81 %
Platelets: 320 K/uL (ref 150–400)
RBC: 4.31 MIL/uL (ref 4.22–5.81)
RDW: 16.4 % — ABNORMAL HIGH (ref 11.5–15.5)
WBC: 15.5 K/uL — ABNORMAL HIGH (ref 4.0–10.5)
nRBC: 0 % (ref 0.0–0.2)

## 2024-09-26 LAB — GLUCOSE, CAPILLARY: Glucose-Capillary: 273 mg/dL — ABNORMAL HIGH (ref 70–99)

## 2024-09-26 LAB — HEMOGLOBIN A1C
Hgb A1c MFr Bld: 6.7 % — ABNORMAL HIGH (ref 4.8–5.6)
Mean Plasma Glucose: 145.59 mg/dL

## 2024-09-26 LAB — URINALYSIS, ROUTINE W REFLEX MICROSCOPIC
Bilirubin Urine: NEGATIVE
Glucose, UA: 50 mg/dL — AB
Ketones, ur: NEGATIVE mg/dL
Leukocytes,Ua: NEGATIVE
Nitrite: NEGATIVE
Protein, ur: 100 mg/dL — AB
Specific Gravity, Urine: 1.017 (ref 1.005–1.030)
pH: 5 (ref 5.0–8.0)

## 2024-09-26 LAB — HEPATIC FUNCTION PANEL
ALT: 11 U/L (ref 0–44)
AST: 20 U/L (ref 15–41)
Albumin: 2.6 g/dL — ABNORMAL LOW (ref 3.5–5.0)
Alkaline Phosphatase: 65 U/L (ref 38–126)
Bilirubin, Direct: 0.3 mg/dL — ABNORMAL HIGH (ref 0.0–0.2)
Indirect Bilirubin: 0.8 mg/dL (ref 0.3–0.9)
Total Bilirubin: 1.1 mg/dL (ref 0.0–1.2)
Total Protein: 7.4 g/dL (ref 6.5–8.1)

## 2024-09-26 LAB — URIC ACID: Uric Acid, Serum: 8.8 mg/dL — ABNORMAL HIGH (ref 3.7–8.6)

## 2024-09-26 LAB — I-STAT CG4 LACTIC ACID, ED
Lactic Acid, Venous: 1.1 mmol/L (ref 0.5–1.9)
Lactic Acid, Venous: 1.5 mmol/L (ref 0.5–1.9)

## 2024-09-26 MED ORDER — GABAPENTIN 300 MG PO CAPS
300.0000 mg | ORAL_CAPSULE | Freq: Two times a day (BID) | ORAL | Status: DC
  Administered 2024-09-26 – 2024-09-29 (×6): 300 mg via ORAL
  Filled 2024-09-26 (×6): qty 1

## 2024-09-26 MED ORDER — SODIUM CHLORIDE 0.9 % IV SOLN: INTRAVENOUS | Status: AC

## 2024-09-26 MED ORDER — SODIUM CHLORIDE 0.9 % IV SOLN
2.0000 g | Freq: Once | INTRAVENOUS | Status: AC
  Administered 2024-09-26: 2 g via INTRAVENOUS
  Filled 2024-09-26: qty 20

## 2024-09-26 MED ORDER — MELATONIN 3 MG PO TABS
3.0000 mg | ORAL_TABLET | Freq: Every evening | ORAL | Status: DC | PRN
Start: 2024-09-26 — End: 2024-09-29
  Administered 2024-09-27 – 2024-09-28 (×3): 3 mg via ORAL
  Filled 2024-09-26 (×3): qty 1

## 2024-09-26 MED ORDER — INSULIN ASPART 100 UNIT/ML IJ SOLN
0.0000 [IU] | Freq: Every day | INTRAMUSCULAR | Status: DC
  Administered 2024-09-26: 3 [IU] via SUBCUTANEOUS
  Administered 2024-09-28: 2 [IU] via SUBCUTANEOUS

## 2024-09-26 MED ORDER — SODIUM CHLORIDE 0.9% FLUSH
3.0000 mL | Freq: Two times a day (BID) | INTRAVENOUS | Status: DC
  Administered 2024-09-26 – 2024-09-29 (×5): 3 mL via INTRAVENOUS

## 2024-09-26 MED ORDER — METRONIDAZOLE 500 MG/100ML IV SOLN
500.0000 mg | Freq: Two times a day (BID) | INTRAVENOUS | Status: DC
  Administered 2024-09-26 – 2024-09-27 (×2): 500 mg via INTRAVENOUS
  Filled 2024-09-26 (×2): qty 100

## 2024-09-26 MED ORDER — OXYCODONE-ACETAMINOPHEN 5-325 MG PO TABS
1.0000 | ORAL_TABLET | ORAL | Status: DC | PRN
Start: 2024-09-26 — End: 2024-09-27
  Administered 2024-09-26 – 2024-09-27 (×3): 1 via ORAL
  Filled 2024-09-26 (×4): qty 1

## 2024-09-26 MED ORDER — HYDROMORPHONE HCL 1 MG/ML IJ SOLN
0.5000 mg | INTRAMUSCULAR | Status: DC | PRN
  Administered 2024-09-26 – 2024-09-28 (×8): 0.5 mg via INTRAVENOUS
  Filled 2024-09-26 (×9): qty 0.5

## 2024-09-26 MED ORDER — VANCOMYCIN VARIABLE DOSE PER UNSTABLE RENAL FUNCTION (PHARMACIST DOSING): Status: DC

## 2024-09-26 MED ORDER — SODIUM CHLORIDE 0.9 % IV SOLN
2.0000 g | INTRAVENOUS | Status: DC
  Administered 2024-09-27: 2 g via INTRAVENOUS
  Filled 2024-09-26: qty 20

## 2024-09-26 MED ORDER — POLYETHYLENE GLYCOL 3350 17 G PO PACK: 17.0000 g | PACK | Freq: Every day | ORAL | Status: DC | PRN

## 2024-09-26 MED ORDER — COLCHICINE 0.3 MG HALF TABLET
0.3000 mg | ORAL_TABLET | Freq: Once | ORAL | Status: AC
  Administered 2024-09-26: 0.3 mg via ORAL
  Filled 2024-09-26 (×2): qty 1

## 2024-09-26 MED ORDER — ONDANSETRON HCL 4 MG/2ML IJ SOLN
4.0000 mg | Freq: Once | INTRAMUSCULAR | Status: AC
  Administered 2024-09-26: 4 mg via INTRAVENOUS
  Filled 2024-09-26: qty 2

## 2024-09-26 MED ORDER — SODIUM CHLORIDE 0.9 % IV BOLUS
1000.0000 mL | Freq: Once | INTRAVENOUS | Status: AC
  Administered 2024-09-26: 1000 mL via INTRAVENOUS

## 2024-09-26 MED ORDER — INSULIN ASPART 100 UNIT/ML IJ SOLN
0.0000 [IU] | Freq: Three times a day (TID) | INTRAMUSCULAR | Status: DC
  Administered 2024-09-27 – 2024-09-28 (×4): 2 [IU] via SUBCUTANEOUS
  Administered 2024-09-28: 3 [IU] via SUBCUTANEOUS
  Administered 2024-09-28: 5 [IU] via SUBCUTANEOUS
  Administered 2024-09-29 (×2): 2 [IU] via SUBCUTANEOUS

## 2024-09-26 MED ORDER — VANCOMYCIN HCL 1750 MG/350ML IV SOLN
1750.0000 mg | Freq: Once | INTRAVENOUS | Status: AC
  Administered 2024-09-26: 1750 mg via INTRAVENOUS
  Filled 2024-09-26: qty 350

## 2024-09-26 MED ORDER — INSULIN NPH (HUMAN) (ISOPHANE) 100 UNIT/ML ~~LOC~~ SUSP
15.0000 [IU] | Freq: Two times a day (BID) | SUBCUTANEOUS | Status: DC
  Administered 2024-09-26 – 2024-09-29 (×6): 15 [IU] via SUBCUTANEOUS
  Filled 2024-09-26 (×2): qty 10

## 2024-09-26 MED ORDER — MORPHINE SULFATE (PF) 4 MG/ML IV SOLN
4.0000 mg | Freq: Once | INTRAVENOUS | Status: AC
  Administered 2024-09-26: 4 mg via INTRAVENOUS
  Filled 2024-09-26: qty 1

## 2024-09-26 NOTE — Progress Notes (Signed)
 ED Pharmacy Antibiotic Sign Off An antibiotic consult was received from an ED provider for vancomycin  per pharmacy dosing for would infection. A chart review was completed to assess appropriateness.   The following one time order(s) were placed:  Vancomycin  1750 mg IV x 1  Further antibiotic and/or antibiotic pharmacy consults should be ordered by the admitting provider if indicated.   Thank you for allowing pharmacy to be a part of this patient's care.   Dorn Poot, [facility]  Clinical Pharmacist 09/26/24 3:19 PM

## 2024-09-26 NOTE — ED Provider Triage Note (Signed)
 Emergency Medicine Provider Triage Evaluation Note  [name] , a 58 y.o. male  was evaluated in triage.  Pt complains of atraumatic knee pain x 4 days.  Patient states that he fell yesterday due to the pain involved in his right knee.  Also appreciates right knee swelling and redness.  Denies history of gout or pseudogout.  Denies fever or chills at home.  Patient states that he did not hit his head or pass out after the falls, denies any other injuries from the falls.  Patient states he has been taking gabapentin  as well as Tylenol  to try and help with the pain.  Has history of recent toe amputations.  States that these over-the-counter medications are not helping.  Patient initially noted to be hypotensive in triage with slight improvement on second reading.  Review of Systems  Positive:          R knee pain, swollen, erythematous Negative: Fever, chills, chest pain, shortness of breath  Physical Exam  BP 94/69 (BP Location: Left Arm)   Pulse (!) 102   Temp 98.1 F (36.7 C)   Resp 18   SpO2 100%  Gen:   Awake, no distress   Resp:  Normal effort  MSK:   Right knee pain and tenderness with palpation, worse medially, right knee swelling and area of erythema Other:    Medical Decision Making  Medically screening exam initiated at 1:55 PM.  Appropriate orders placed.  [name] was informed that the remainder of the evaluation will be completed by another provider, this initial triage assessment does not replace that evaluation, and the importance of remaining in the ED until their evaluation is complete.  Orders: X-rays right knee, uric acid, CBC, BMP, lactic acid   Oksana Deberry F, PA-C 09/26/24 1403

## 2024-09-26 NOTE — ED Triage Notes (Addendum)
 Patient has R knee pain without injury x 4 days, causing him to fall twice yesterday. Did not hit head or pass out and denies other injury from falls. Taking Gabapentin  he has for his recent toe amputations hoping it would relieve the pain, but no improvement. No OTC meds. Hypotensive in triage.

## 2024-09-26 NOTE — ED Notes (Signed)
 Meds not verified at this time unable to give

## 2024-09-26 NOTE — Plan of Care (Signed)
   Problem: Education: Goal: Knowledge of General Education information will improve Description: Including pain rating scale, medication(s)/side effects and non-pharmacologic comfort measures Outcome: Progressing   Problem: Clinical Measurements: Goal: Respiratory complications will improve Outcome: Progressing   Problem: Nutrition: Goal: Adequate nutrition will be maintained Outcome: Progressing

## 2024-09-26 NOTE — H&P (Addendum)
 History and Physical   [name] FMW:[id number] DOB: March 16, 1966 DOA: 09/26/2024  PCP: [name], MD   Patient coming from: Home  Chief Complaint: Knee pain, fall, chronic foot wound  HPI: [name] is a 58 y.o. male with medical history significant of hypertension, hyperlipidemia, diabetes, CKD 3A, obesity, anemia, chronic wounds, history of MRSA bacteremia and endocarditis, status post right great toe amputation presenting with right knee pain, fall, chronic left foot wounds.  Patient notes having recent pain of his right knee.  States that due to the pain the knee has been giving out and has had some falls.  Also has chronic left foot wound which has foul odor at this time.  Has previously declined amputation which was recommended in the past.  Denies fevers, chills, chest pain, shortness of breath, abdominal pain, constipation, diarrhea, nausea, vomiting.  ED Course: Vital signs in ED notable for blood pressure in the 90s-160s systolic, and heart rate in the 90s-100s.  Lab workup included BMP with sodium 131, glucose 281, BUN 26, creatinine 2.7 from baseline 1.3, calcium  8.3.  LFTs with albumin 2.6.  CBC with leukocytosis to 15.5, hemoglobin stable at 10.6.  Lactic acid negative x 2.  Uric acid mildly elevated at 8.8.  UDS, urinalysis, A1c, blood cultures pending.  Initially plan was to get synovial fluid analysis of the right knee but there was no significant effusion to collect fluid from.  Patient received vancomycin , ceftriaxone , morphine , 1 L IV fluids in the ED.  Review of Systems: As per HPI otherwise all other systems reviewed and are negative.  Past Medical History:  Diagnosis Date   Amputation of right great toe    Cellulitis and abscess of foot    CKD stage 3a, GFR 45-59 ml/min (HCC)    Cocaine abuse (HCC)    + UDS on 05-12-24   DM (diabetes mellitus), type 2 (HCC)    ESBL (extended spectrum beta-lactamase) producing bacteria infection 04/18/2024    Hyperlipidemia    Hypertension    Methicillin resistant Staphylococcus aureus infection 07/14/2024   MRSA bacteremia    Normocytic anemia    Obesity    Tobacco abuse    Toe osteomyelitis, left (HCC)    Tuberculosis    tested positive,treated with medications.    Past Surgical History:  Procedure Laterality Date   AMPUTATION TOE Right 03/26/2024   Procedure: AMPUTATION, TOE;  Surgeon: Harden Jerona GAILS, MD;  Location: [facility] OR;  Service: Orthopedics;  Laterality: Right;  RIGHT GREAT TOE AMPUTATION   BONE BIOPSY  04/18/2024   Procedure: BIOPSY, BONE (2ND METATARSAL);  Surgeon: Ashley Soulier, DPM;  Location: [facility] ORS;  Service: Orthopedics/Podiatry;;   FOOT SURGERY Left    I & D EXTREMITY Left 05/05/2022   Procedure: LEFT FOOT DEBRIDEMENT;  Surgeon: Harden Jerona GAILS, MD;  Location: [facility] OR;  Service: Orthopedics;  Laterality: Left;   INCISION AND DRAINAGE OF WOUND Right 06/05/2024   Procedure: DELAYED PRIMARY CLOSURE OF RIGHT FOOT WOUND, EXCISION OF DISTAL METATARSAL 2, 3, 4, and 5;  Surgeon: Ashley Soulier, DPM;  Location: [facility] ORS;  Service: Orthopedics/Podiatry;  Laterality: Right;   IRRIGATION AND DEBRIDEMENT FOOT Right 04/18/2024   Procedure: IRRIGATION AND DEBRIDEMENT FOOT, PLACEMENT OF ANTIBIOTIC BEADS;  Surgeon: Ashley Soulier, DPM;  Location: [facility] ORS;  Service: Orthopedics/Podiatry;  Laterality: Right;   IRRIGATION AND DEBRIDEMENT FOOT Left 06/29/2024   Procedure: IRRIGATION AND DEBRIDEMENT FOOT;  Surgeon: Lennie Barter, DPM;  Location: [facility] ORS;  Service: Orthopedics/Podiatry;  Laterality: Left;  MYRINGOTOMY WITH TUBE PLACEMENT Bilateral 07/20/2022   Procedure: MYRINGOTOMY WITH TUBE PLACEMENT;  Surgeon: Edda Mt, MD;  Location: [facility];  Service: ENT;  Laterality: Bilateral;  Diabetic   TEE WITHOUT CARDIOVERSION N/A 07/02/2024   Procedure: ECHOCARDIOGRAM, TRANSESOPHAGEAL;  Surgeon: Perla Evalene PARAS, MD;  Location: [facility] ORS;  Service: Cardiovascular;  Laterality: N/A;    TONSILLECTOMY     TRANSMETATARSAL AMPUTATION Right 04/23/2024   Procedure: AMPUTATION, FOOT, TRANSMETATARSAL;  Surgeon: Ashley Soulier, DPM;  Location: [facility] ORS;  Service: Orthopedics/Podiatry;  Laterality: Right;   TRANSMETATARSAL AMPUTATION Right 06/29/2024   Procedure: AMPUTATION, FOOT, TRANSMETATARSAL;  Surgeon: Lennie Barter, DPM;  Location: [facility] ORS;  Service: Orthopedics/Podiatry;  Laterality: Right;  RIGHT TRANSMETATARSAL AMPUTATION REVISION    Social History  reports that he quit smoking about 2 years ago. His smoking use included cigarettes. He started smoking about 27 years ago. He has a 6.3 pack-year smoking history. He has never used smokeless tobacco. He reports that he does not drink alcohol and does not use drugs.  No Known Allergies  Family History  Problem Relation Age of Onset   Colon cancer Neg Hx    Colon polyps Neg Hx    Crohn's disease Neg Hx    Esophageal cancer Neg Hx    Rectal cancer Neg Hx    Stomach cancer Neg Hx    Ulcerative colitis Neg Hx   Reviewed on admission  Prior to Admission medications   Medication Sig Start Date End Date Taking? Authorizing Provider  Accu-Chek Softclix Lancets lancets Use to check blood sugar 3 times daily. 04/01/24   Newlin, Enobong, MD  bisacodyl  (DULCOLAX) 5 MG EC tablet Take 2 tablets (10 mg total) by mouth at bedtime as needed for moderate constipation. 04/29/24   Von Bellis, MD  Blood Glucose Monitoring Suppl (ACCU-CHEK GUIDE) w/Device KIT Use to check blood sugar 3 times daily. 04/01/24   Newlin, Enobong, MD  chlorhexidine  (HIBICLENS ) 4 % external liquid Apply 15 mLs (1 Application total) topically as directed for 30 doses. Use as directed daily for 5 days every other week for 6 weeks. 06/05/24   Ashley Soulier, DPM  Cholecalciferol  (VITAMIN D3) 1.25 MG (50000 UT) CAPS Take 1 capsule (50,000 Units total) by mouth every Monday. 08/11/24     Continuous Blood Gluc Transmit (DEXCOM G6 TRANSMITTER) MISC Use to check blood sugar three  times daily. Change transmitter once every 909 days. E11.69 06/05/22   Newlin, Enobong, MD  gabapentin  (NEURONTIN ) 300 MG capsule Take 1 capsule (300 mg total) by mouth 2 (two) times daily. 07/14/24 08/13/24  Trudy Anthony HERO, MD  gabapentin  (NEURONTIN ) 300 MG capsule Take 1 capsule (300 mg total) by mouth 2 (two) times daily for neuropathy pain. 08/05/24     glucose blood (ACCU-CHEK GUIDE TEST) test strip Use to check blood sugar 3 times daily. 04/01/24   Newlin, Enobong, MD  insulin  aspart (NOVOLOG  FLEXPEN) 100 UNIT/ML FlexPen INJECT PER SLIDING SCALE BEFORE MEALS: 121-150 = 2 UNITS, 151-200= 3 UNITS , 201-250= 5 UNITS, 251-300= 8 UNITS, 301-350= 11 UNITS , 351-400= 15 UNITS . ABOVE 400 CALL MEDICAL DOCTOR! 08/05/24     insulin  aspart (NOVOLOG ) 100 UNIT/ML FlexPen Inject 0-15 Units into the skin 3 (three) times daily before meals.  Correction coverage: Moderate (average weight, post-op) CBG 70 - 120: 0 units CBG 121 - 150: 2 units CBG 151 - 200: 3 units CBG 201 - 250: 5 units CBG 251 - 300: 8 units CBG 301 - 350: 11 units  CBG 351 - 400: 15 units CBG > 400: call MD 04/29/24   Von Bellis, MD  Insulin  NPH, Human,, Isophane, (HUMULIN  N KWIKPEN) 100 UNIT/ML Kiwkpen Inject 22 Units into the skin 2 (two) times daily. 04/29/24   Von Bellis, MD  Insulin  NPH, Human,, Isophane, (HUMULIN  N KWIKPEN) 100 UNIT/ML Kiwkpen Inject 22 Units into the skin 2 (two) times daily. 08/05/24     Insulin  Pen Needle (TECHLITE PEN NEEDLES) 32G X 4 MM MISC Use as directed to inject insulin  up to 4 times daily. 08/11/24   Newlin, Enobong, MD  iron  polysaccharides (NIFEREX) 150 MG capsule Take 1 capsule (150 mg total) by mouth daily. 04/30/24 08/13/24  Von Bellis, MD  losartan  (COZAAR ) 25 MG tablet Take 1 tablet (25 mg total) by mouth daily. Patient taking differently: Take 25 mg by mouth every morning. 04/30/24 04/30/25  Von Bellis, MD  losartan  (COZAAR ) 25 MG tablet Take 1 tablet (25 mg total) by mouth daily for hypertension.  08/05/24     metFORMIN  (GLUCOPHAGE -XR) 500 MG 24 hr tablet Take 2 tablets (1,000 mg total) by mouth 2 (two) times daily with a meal. 05/13/24   Calone, Cordella BIRCH, FNP  metFORMIN  (GLUCOPHAGE -XR) 500 MG 24 hr tablet Take 2 tablets (1,000 mg total) by mouth 2 (two) times daily with a meal. 08/05/24     nitroGLYCERIN  (NITRODUR - DOSED IN MG/24 HR) 0.2 mg/hr patch Place 1 patch (0.2 mg total) onto the skin daily. 04/01/24   Harden Jerona GAILS, MD  nitroGLYCERIN  (NITRODUR - DOSED IN MG/24 HR) 0.2 mg/hr patch Place 1 patch (0.2 mg total) onto the skin in the morning & remove at bedtime (remove per schedule) 08/05/24     oxyCODONE -acetaminophen  (PERCOCET/ROXICET) 5-325 MG tablet Take 1 tablet by mouth every 4 (four) hours as needed for pain. 08/05/24     polyethylene glycol powder (GLYCOLAX /MIRALAX ) 17 GM/SCOOP powder Take 17 g by mouth 2 (two) times daily. Mix as directed. Patient taking differently: Take 17 g by mouth 2 (two) times daily as needed for mild constipation. Mix as directed. 04/29/24   Von Bellis, MD    Physical Exam: Vitals:   09/26/24 1545 09/26/24 1630 09/26/24 1645 09/26/24 1700  BP: (!) 142/85 (!) 168/78    Pulse: 96 93 95 94  Resp:   15 19  Temp:      SpO2: 100% 100% 100% 98%    Physical Exam Constitutional:      General: He is not in acute distress.    Appearance: Normal appearance.  HENT:     Head: Normocephalic and atraumatic.     Mouth/Throat:     Mouth: Mucous membranes are moist.     Pharynx: Oropharynx is clear.  Eyes:     Extraocular Movements: Extraocular movements intact.     Pupils: Pupils are equal, round, and reactive to light.  Cardiovascular:     Rate and Rhythm: Normal rate and regular rhythm.     Pulses: Normal pulses.     Heart sounds: Normal heart sounds.  Pulmonary:     Effort: Pulmonary effort is normal. No respiratory distress.     Breath sounds: Normal breath sounds.  Abdominal:     General: Bowel sounds are normal. There is no distension.      Palpations: Abdomen is soft.     Tenderness: There is no abdominal tenderness.  Musculoskeletal:        General: No swelling or deformity.     Comments: Left foot wound on plantar  aspect. Erythema of right knee.  Skin:    General: Skin is warm and dry.  Neurological:     General: No focal deficit present.     Mental Status: Mental status is at baseline.       Labs on Admission: I have personally reviewed following labs and imaging studies  CBC: Recent Labs  Lab 09/26/24 1402  WBC 15.5*  NEUTROABS 12.5*  HGB 10.6*  HCT 34.3*  MCV 79.6*  PLT 320    Basic Metabolic Panel: Recent Labs  Lab 09/26/24 1402  NA 131*  K 3.7  CL 98  CO2 22  GLUCOSE 281*  BUN 26*  CREATININE 2.73*  CALCIUM  8.3*    GFR: CrCl cannot be calculated (Unknown ideal weight.).  Liver Function Tests: Recent Labs  Lab 09/26/24 1548  AST 20  ALT 11  ALKPHOS 65  BILITOT 1.1  PROT 7.4  ALBUMIN 2.6*    Urine analysis:    Component Value Date/Time   COLORURINE YELLOW (A) 04/17/2024 1302   APPEARANCEUR CLEAR (A) 04/17/2024 1302   LABSPEC 1.014 04/17/2024 1302   PHURINE 5.0 04/17/2024 1302   GLUCOSEU NEGATIVE 04/17/2024 1302   HGBUR NEGATIVE 04/17/2024 1302   BILIRUBINUR NEGATIVE 04/17/2024 1302   KETONESUR NEGATIVE 04/17/2024 1302   PROTEINUR NEGATIVE 04/17/2024 1302   UROBILINOGEN 0.2 11/10/2010 1558   NITRITE NEGATIVE 04/17/2024 1302   LEUKOCYTESUR NEGATIVE 04/17/2024 1302    Radiological Exams on Admission: DG Foot Complete Left Result Date: 09/26/2024 CLINICAL DATA:  Fall EXAM: LEFT FOOT - COMPLETE 3+ VIEW COMPARISON:  06/29/2024, 03/25/2024 FINDINGS: Large ulcer/wound plantar aspect of the mid to distal foot. No fracture is seen. Chronic deformity of the fifth metatarsal. Chronic deformities of the second third and fourth metatarsal heads. Interval increased periosteal reaction at the head of the first metatarsal and base of first proximal phalanx on the lateral side. There  appears to be ankylosis across the second and third MTP joints. Lucency at the level of the second and third MTP joint/metatarsal head/bases of the second and third proximal phalanges on the oblique view, some of which may be due to soft tissue ulcer. Sclerosis of the second third and fifth metatarsals which may be due to chronic osteomyelitis. Possible increased periosteal reaction along the shaft of the fifth metatarsal on lateral view. IMPRESSION: 1. Large ulcer/wound plantar aspect of the mid to distal foot. No acute fracture is seen. 2. Interval increased periosteal reaction at the head of the first metatarsal and base of first proximal phalanx on the lateral side, possible osteomyelitis. 3. Chronic deformities of the second through fifth metatarsals. Sclerosis of the second third and fifth metatarsal suggesting chronic osteomyelitis. Lucency overlying the second and third MTP joints/heads of metatarsals/bases of proximal phalanges probably in part due to superimposed soft tissue ulcer but cannot exclude erosive change related to osteomyelitis. Consider MRI follow-up. Electronically Signed   By: Luke Bun M.D.   On: 09/26/2024 15:52   DG Knee Complete 4 Views Right Result Date: 09/26/2024 EXAM: 4 OR MORE VIEW(S) XRAY OF THE R KNEE 09/26/2024 02:49:00 PM COMPARISON: None available. CLINICAL HISTORY: R knee pain and swelling, erythema. Per triage notes: Patient has R knee pain without injury x 4 days, causing him to fall twice yesterday. Did not hit head or pass out and denies other injury from falls. FINDINGS: BONES AND JOINTS: No acute fracture. No focal osseous lesion. No joint dislocation. No significant joint effusion. No significant degenerative changes. SOFT TISSUES: The soft tissues  are unremarkable. IMPRESSION: 1. No acute osseous abnormality of the knee. Electronically signed by: Selinda Blue MD 09/26/2024 03:21 PM EDT RP Workstation: [id number]   EKG: Independently reviewed.  Sinus rhythm at  96 bpm.  Nonspecific T wave changes.  Assessment/Plan Principal Problem:   Acute renal failure superimposed on stage 3a chronic kidney disease, unspecified acute renal failure type (HCC) Active Problems:   Uncontrolled type 2 diabetes mellitus with hyperglycemia, with long-term current use of insulin  (HCC)   Hypertension associated with diabetes (HCC)   HLD (hyperlipidemia)   Chronic kidney disease, stage 3a (HCC)   Obesity (BMI 30-39.9)   Normocytic anemia   AKI on CKD 3 acute renal failure on chronic A > Creatinine elevated 2.73 from baseline 1.3, BUN 26.  Likely secondary to dehydration. > Received 1 L IV fluids in the ED. - Continue with IV fluids - Avoid nephrotoxic medications - Hold homeLosartan - Trend renal function and electrolytes  Acute on chronic osteomyelitis > Patient with chronic foot wounds and prior osteomyelitis.  Also history of prior MRSA bacteremia and endocarditis. > Foul odor today with leukocytosis to 15.5.  Evidence of changes consistent with old and possible new osteomyelitis on x-ray, MRI recommended. > Patient had been offered amputation in the past but had declined, at this time would like to start with a trial of antibiotics.  Will get MRI before discussing further with patient and consulting surgical service. - Monitor on telemetry - Continue vancomycin , ceftriaxone , add Flagyl  - WOC consult - Continue home oxycodone  - Supportive care  Right knee pain > Presumed gout. Mildly elevated Uric acid. No effusion for labs when checked in ED. > Infection and AKI limits options. - Low dose colchicine - If renal function improves will have more options if pain persists  HTN - Holding losartan   Hyperlipidemia - Continue home atorvastatin   Diabetes > 22 units NPH twice daily at home and SSI - 15 units NPH twice daily - SSI  Obesity - Noted  Anemia - Stable at 10.6  DVT prophylaxis: SCDs for now in case surgical intervention becomes  needed Code Status:   Full Family Communication:  None on admission  Disposition Plan:   Patient is from:  Home  Anticipated DC to:  Home  Anticipated DC date:  1 to 4 days  Anticipated DC barriers: None  Consults called:  None thus far Admission status:  Observation, telemetry  Severity of Illness: The appropriate patient status for this patient is OBSERVATION. Observation status is judged to be reasonable and necessary in order to provide the required intensity of service to ensure the patient's safety. The patient's presenting symptoms, physical exam findings, and initial radiographic and laboratory data in the context of their medical condition is felt to place them at decreased risk for further clinical deterioration. Furthermore, it is anticipated that the patient will be medically stable for discharge from the hospital within 2 midnights of admission.    Marsa KATHEE Scurry MD [facility]  How to contact the [facility] Attending or Consulting provider 7A - 7P or covering provider during after hours 7P -7A, for this patient?   Check the care team in [facility] and look for a) attending/consulting [facility] provider listed and b) the [facility] team listed Log into [email or web address] and use 's universal password to access. If you do not have the password, please contact the hospital operator. Locate the [facility] provider you are looking for under [facility] and page to a number that you can be  directly reached. If you still have difficulty reaching the provider, please page the [facility] (Director on Call) for the Hospitalists listed on amion for assistance.  09/26/2024, 5:50 PM

## 2024-09-26 NOTE — Progress Notes (Incomplete)
New Admission Note:   Arrival Method:  Mental Orientation: Telemetry:  Assessment: Completed Skin: IV: Pain:  Tubes: Safety Measures: Safety Fall Prevention Plan has been discussed.  Admission: Completed 5MW Orientation: Patient has been oriented to the room, unit and staff.  Family:  Orders have been reviewed and implemented. Will continue to monitor the patient. Call light has been placed within reach and bed alarm has been activated.   Charito Bokiagon BSN, RN-BC Phone number: [phone] 

## 2024-09-26 NOTE — Progress Notes (Signed)
 Pharmacy Antibiotic Note  [name] is a 58 y.o. male admitted on 09/26/2024 with concern for osteo.  Pharmacy has been consulted for vancomycin  dosing.  Plan: Vancomycin  1750 mg IV x 1, then variable dosing due to unstable renal function Monitor renal function, Cx and clinical progression to narrow Vancomycin  random level as needed     Temp (24hrs), Avg:98.4 F (36.9 C), Min:98.1 F (36.7 C), Max:98.7 F (37.1 C)  Recent Labs  Lab 09/26/24 1402 09/26/24 1415 09/26/24 1555  WBC 15.5*  --   --   CREATININE 2.73*  --   --   LATICACIDVEN  --  1.1 1.5    CrCl cannot be calculated (Unknown ideal weight.).    No Known Allergies  Dorn Poot, PharmD, [facility] Clinical Pharmacist ED Pharmacist Phone # [phone] 09/26/2024 7:10 PM

## 2024-09-26 NOTE — ED Notes (Signed)
 Pt aware urine sample needed, supply at bedside for body fluid orders

## 2024-09-26 NOTE — ED Provider Notes (Signed)
 [place] EMERGENCY DEPARTMENT AT [facility] Provider Note   CSN: [id number] Arrival date & time: 09/26/24  1235     Patient presents with: Knee Pain and Fall   [name] is a 58 y.o. male.   Pt is a 58 yo male with pmhx significant for DM2, osteomyelitis, HTN, CKD, HLD, and hx cocaine abuse.  Pt is here for right knee pain.  He said his right knee is painful and it's been giving out.  Pt denies any head trauma.  Pt also has a hx of chronic wounds to left foot and amputation had been recommended.  However, pt declined.  Pt's wounds have a foul odor today.       Prior to Admission medications   Medication Sig Start Date End Date Taking? Authorizing Provider  Accu-Chek Softclix Lancets lancets Use to check blood sugar 3 times daily. 04/01/24   Newlin, Enobong, MD  bisacodyl  (DULCOLAX) 5 MG EC tablet Take 2 tablets (10 mg total) by mouth at bedtime as needed for moderate constipation. 04/29/24   Von Bellis, MD  Blood Glucose Monitoring Suppl (ACCU-CHEK GUIDE) w/Device KIT Use to check blood sugar 3 times daily. 04/01/24   Newlin, Enobong, MD  chlorhexidine  (HIBICLENS ) 4 % external liquid Apply 15 mLs (1 Application total) topically as directed for 30 doses. Use as directed daily for 5 days every other week for 6 weeks. 06/05/24   Ashley Soulier, DPM  Cholecalciferol  (VITAMIN D3) 1.25 MG (50000 UT) CAPS Take 1 capsule (50,000 Units total) by mouth every Monday. 08/11/24     Continuous Blood Gluc Transmit (DEXCOM G6 TRANSMITTER) MISC Use to check blood sugar three times daily. Change transmitter once every 909 days. E11.69 06/05/22   Newlin, Enobong, MD  gabapentin  (NEURONTIN ) 300 MG capsule Take 1 capsule (300 mg total) by mouth 2 (two) times daily. 07/14/24 08/13/24  Trudy Anthony HERO, MD  gabapentin  (NEURONTIN ) 300 MG capsule Take 1 capsule (300 mg total) by mouth 2 (two) times daily for neuropathy pain. 08/05/24     glucose blood (ACCU-CHEK GUIDE TEST) test strip Use to check blood  sugar 3 times daily. 04/01/24   Newlin, Enobong, MD  insulin  aspart (NOVOLOG  FLEXPEN) 100 UNIT/ML FlexPen INJECT PER SLIDING SCALE BEFORE MEALS: 121-150 = 2 UNITS, 151-200= 3 UNITS , 201-250= 5 UNITS, 251-300= 8 UNITS, 301-350= 11 UNITS , 351-400= 15 UNITS . ABOVE 400 CALL MEDICAL DOCTOR! 08/05/24     insulin  aspart (NOVOLOG ) 100 UNIT/ML FlexPen Inject 0-15 Units into the skin 3 (three) times daily before meals.  Correction coverage: Moderate (average weight, post-op) CBG 70 - 120: 0 units CBG 121 - 150: 2 units CBG 151 - 200: 3 units CBG 201 - 250: 5 units CBG 251 - 300: 8 units CBG 301 - 350: 11 units CBG 351 - 400: 15 units CBG > 400: call MD 04/29/24   Von Bellis, MD  Insulin  NPH, Human,, Isophane, (HUMULIN  N KWIKPEN) 100 UNIT/ML Kiwkpen Inject 22 Units into the skin 2 (two) times daily. 04/29/24   Von Bellis, MD  Insulin  NPH, Human,, Isophane, (HUMULIN  N KWIKPEN) 100 UNIT/ML Kiwkpen Inject 22 Units into the skin 2 (two) times daily. 08/05/24     Insulin  Pen Needle (TECHLITE PEN NEEDLES) 32G X 4 MM MISC Use as directed to inject insulin  up to 4 times daily. 08/11/24   Newlin, Enobong, MD  iron  polysaccharides (NIFEREX) 150 MG capsule Take 1 capsule (150 mg total) by mouth daily. 04/30/24 08/13/24  Von Bellis, MD  losartan  (COZAAR ) 25 MG tablet Take 1 tablet (25 mg total) by mouth daily. Patient taking differently: Take 25 mg by mouth every morning. 04/30/24 04/30/25  Von Bellis, MD  losartan  (COZAAR ) 25 MG tablet Take 1 tablet (25 mg total) by mouth daily for hypertension. 08/05/24     metFORMIN  (GLUCOPHAGE -XR) 500 MG 24 hr tablet Take 2 tablets (1,000 mg total) by mouth 2 (two) times daily with a meal. 05/13/24   Calone, Gregory D, FNP  metFORMIN  (GLUCOPHAGE -XR) 500 MG 24 hr tablet Take 2 tablets (1,000 mg total) by mouth 2 (two) times daily with a meal. 08/05/24     nitroGLYCERIN  (NITRODUR - DOSED IN MG/24 HR) 0.2 mg/hr patch Place 1 patch (0.2 mg total) onto the skin daily. 04/01/24   Harden Jerona GAILS, MD  nitroGLYCERIN  (NITRODUR - DOSED IN MG/24 HR) 0.2 mg/hr patch Place 1 patch (0.2 mg total) onto the skin in the morning & remove at bedtime (remove per schedule) 08/05/24     oxyCODONE -acetaminophen  (PERCOCET/ROXICET) 5-325 MG tablet Take 1 tablet by mouth every 4 (four) hours as needed for pain. 08/05/24     polyethylene glycol powder (GLYCOLAX /MIRALAX ) 17 GM/SCOOP powder Take 17 g by mouth 2 (two) times daily. Mix as directed. Patient taking differently: Take 17 g by mouth 2 (two) times daily as needed for mild constipation. Mix as directed. 04/29/24   Von Bellis, MD    Allergies: Patient has no known allergies.    Review of Systems  Musculoskeletal:        Right knee pain  Skin:  Positive for wound.  All other systems reviewed and are negative.   Updated Vital Signs BP (!) 168/78   Pulse 94   Temp 98.7 F (37.1 C)   Resp 19   SpO2 98%   Physical Exam Vitals and nursing note reviewed.  Constitutional:      Appearance: Normal appearance.  HENT:     Head: Normocephalic and atraumatic.     Right Ear: External ear normal.     Left Ear: External ear normal.     Nose: Nose normal.     Mouth/Throat:     Mouth: Mucous membranes are moist.     Pharynx: Oropharynx is clear.  Eyes:     Extraocular Movements: Extraocular movements intact.     Conjunctiva/sclera: Conjunctivae normal.     Pupils: Pupils are equal, round, and reactive to light.  Cardiovascular:     Rate and Rhythm: Normal rate and regular rhythm.     Pulses: Normal pulses.     Heart sounds: Normal heart sounds.  Pulmonary:     Effort: Pulmonary effort is normal.     Breath sounds: Normal breath sounds.  Abdominal:     General: Abdomen is flat. Bowel sounds are normal.     Palpations: Abdomen is soft.  Musculoskeletal:     Cervical back: Normal range of motion and neck supple.     Comments: Mild redness to right knee.  Joint has good rom.   Right transmetatarsal foot amputation   Skin:     Capillary Refill: Capillary refill takes less than 2 seconds.  Neurological:     General: No focal deficit present.     Mental Status: He is alert and oriented to person, place, and time.  Psychiatric:        Mood and Affect: Mood normal.        Behavior: Behavior normal.        (all labs ordered  are listed, but only abnormal results are displayed) Labs Reviewed  CBC WITH DIFFERENTIAL/PLATELET - Abnormal; Notable for the following components:      Result Value   WBC 15.5 (*)    Hemoglobin 10.6 (*)    HCT 34.3 (*)    MCV 79.6 (*)    MCH 24.6 (*)    RDW 16.4 (*)    Neutro Abs 12.5 (*)    Monocytes Absolute 1.4 (*)    Abs Immature Granulocytes 0.10 (*)    All other components within normal limits  BASIC METABOLIC PANEL WITH GFR - Abnormal; Notable for the following components:   Sodium 131 (*)    Glucose, Bld 281 (*)    BUN 26 (*)    Creatinine, Ser 2.73 (*)    Calcium  8.3 (*)    GFR, Estimated 26 (*)    All other components within normal limits  URIC ACID - Abnormal; Notable for the following components:   Uric Acid, Serum 8.8 (*)    All other components within normal limits  HEPATIC FUNCTION PANEL - Abnormal; Notable for the following components:   Albumin 2.6 (*)    Bilirubin, Direct 0.3 (*)    All other components within normal limits  CULTURE, BLOOD (ROUTINE X 2)  CULTURE, BLOOD (ROUTINE X 2)  BODY FLUID CULTURE W GRAM STAIN  RAPID URINE DRUG SCREEN, HOSP PERFORMED  URINALYSIS, ROUTINE W REFLEX MICROSCOPIC  GLUCOSE, BODY FLUID OTHER            PROTEIN, BODY FLUID (OTHER)  SYNOVIAL CELL COUNT + DIFF, W/ CRYSTALS  HEMOGLOBIN A1C  I-STAT CG4 LACTIC ACID, ED  I-STAT CG4 LACTIC ACID, ED    EKG: EKG Interpretation Date/Time:  Friday September 26 2024 16:43:54 EDT Ventricular Rate:  96 PR Interval:  172 QRS Duration:  100 QT Interval:  355 QTC Calculation: 449 R Axis:   84  Text Interpretation: Sinus rhythm Borderline low voltage, extremity leads Probable  anteroseptal infarct, old No significant change since last tracing Confirmed by Dean Clarity [id number]) on 09/26/2024 5:30:49 PM  Radiology: [facility] Foot Complete Left Result Date: 09/26/2024 CLINICAL DATA:  Fall EXAM: LEFT FOOT - COMPLETE 3+ VIEW COMPARISON:  06/29/2024, 03/25/2024 FINDINGS: Large ulcer/wound plantar aspect of the mid to distal foot. No fracture is seen. Chronic deformity of the fifth metatarsal. Chronic deformities of the second third and fourth metatarsal heads. Interval increased periosteal reaction at the head of the first metatarsal and base of first proximal phalanx on the lateral side. There appears to be ankylosis across the second and third MTP joints. Lucency at the level of the second and third MTP joint/metatarsal head/bases of the second and third proximal phalanges on the oblique view, some of which may be due to soft tissue ulcer. Sclerosis of the second third and fifth metatarsals which may be due to chronic osteomyelitis. Possible increased periosteal reaction along the shaft of the fifth metatarsal on lateral view. IMPRESSION: 1. Large ulcer/wound plantar aspect of the mid to distal foot. No acute fracture is seen. 2. Interval increased periosteal reaction at the head of the first metatarsal and base of first proximal phalanx on the lateral side, possible osteomyelitis. 3. Chronic deformities of the second through fifth metatarsals. Sclerosis of the second third and fifth metatarsal suggesting chronic osteomyelitis. Lucency overlying the second and third MTP joints/heads of metatarsals/bases of proximal phalanges probably in part due to superimposed soft tissue ulcer but cannot exclude erosive change related to osteomyelitis. Consider MRI follow-up. Electronically  Signed   By: Luke Bun M.D.   On: 09/26/2024 15:52   DG Knee Complete 4 Views Right Result Date: 09/26/2024 EXAM: 4 OR MORE VIEW(S) XRAY OF THE R KNEE 09/26/2024 02:49:00 PM COMPARISON: None available. CLINICAL  HISTORY: R knee pain and swelling, erythema. Per triage notes: Patient has R knee pain without injury x 4 days, causing him to fall twice yesterday. Did not hit head or pass out and denies other injury from falls. FINDINGS: BONES AND JOINTS: No acute fracture. No focal osseous lesion. No joint dislocation. No significant joint effusion. No significant degenerative changes. SOFT TISSUES: The soft tissues are unremarkable. IMPRESSION: 1. No acute osseous abnormality of the knee. Electronically signed by: Selinda Blue MD 09/26/2024 03:21 PM EDT RP Workstation: [id number]     Procedures   Medications Ordered in the ED  vancomycin  (VANCOREADY) IVPB 1750 mg/350 mL (has no administration in time range)  sodium chloride  0.9 % bolus 1,000 mL (0 mLs Intravenous Stopped 09/26/24 1714)  morphine  (PF) 4 MG/ML injection 4 mg (4 mg Intravenous Given 09/26/24 1536)  ondansetron  (ZOFRAN ) injection 4 mg (4 mg Intravenous Given 09/26/24 1536)  cefTRIAXone  (ROCEPHIN ) 2 g in sodium chloride  0.9 % 100 mL IVPB (0 g Intravenous Stopped 09/26/24 1638)                                    Medical Decision Making Amount and/or Complexity of Data Reviewed Labs: ordered. Radiology: ordered.  Risk Prescription drug management. Decision regarding hospitalization.   This patient presents to the ED for concern of right knee pain, this involves an extensive number of treatment options, and is a complaint that carries with it a high risk of complications and morbidity.  The differential diagnosis includes septic, gout   Co morbidities that complicate the patient evaluation  DM2, osteomyelitis, HTN, CKD, HLD, and hx cocaine abuse   Additional history obtained:  Additional history obtained from epic chart review  Lab Tests:  I Ordered, and personally interpreted labs.  The pertinent results include:  cbc with wbc elevated at 15.5, hgb low at 10.6 (hgb 8.4 on 8/11); bmp with na 131, glucose elevated at 281, cr 2.73  (bun 30 and cr 1.41 on 8/11); uric acid elevated at 8.8; lactic 1.1   Imaging Studies ordered:  I ordered imaging studies including r knee, left foot  I independently visualized and interpreted imaging which showed  R knee: No acute osseous abnormality of the knee.  L foot:   . Large ulcer/wound plantar aspect of the mid to distal foot. No  acute fracture is seen.  2. Interval increased periosteal reaction at the head of the first  metatarsal and base of first proximal phalanx on the lateral side,  possible osteomyelitis.  3. Chronic deformities of the second through fifth metatarsals.  Sclerosis of the second third and fifth metatarsal suggesting  chronic osteomyelitis. Lucency overlying the second and third MTP  joints/heads of metatarsals/bases of proximal phalanges probably in  part due to superimposed soft tissue ulcer but cannot exclude  erosive change related to osteomyelitis. Consider MRI follow-up.   I agree with the radiologist interpretation   Cardiac Monitoring:  The patient was maintained on a cardiac monitor.  I personally viewed and interpreted the cardiac monitored which showed an underlying rhythm of: nsr   Medicines ordered and prescription drug management:  I ordered medication including rocephin /vancomycin   for sx  Reevaluation of the patient after these medicines showed that the patient improved I have reviewed the patients home medicines and have made adjustments as needed   Test Considered:  ct   Critical Interventions:  abx   Consultations Obtained:  I requested consultation with the hospitalist (Dr. Seena),  and discussed lab and imaging findings as well as pertinent plan - he will admit   Problem List / ED Course:  AKI:  likely dehydration R knee pain:  likely gout.  No fluid collection to send for analysis. Left foot osteomyelitis:  iv rocephin /vanc given   Reevaluation:  After the interventions noted above, I reevaluated the  patient and found that they have :improved   Social Determinants of Health:  Lives at home   Dispostion:  After consideration of the diagnostic results and the patients response to treatment, I feel that the patent would benefit from admission.       Final diagnoses:  Osteomyelitis of left foot, unspecified type (HCC)  AKI (acute kidney injury)  Acute gout of right knee, unspecified cause    ED Discharge Orders     None          Dean Clarity, MD 09/26/24 1732

## 2024-09-27 ENCOUNTER — Observation Stay (HOSPITAL_COMMUNITY)

## 2024-09-27 DIAGNOSIS — Z794 Long term (current) use of insulin: Secondary | ICD-10-CM | POA: Diagnosis not present

## 2024-09-27 DIAGNOSIS — M25561 Pain in right knee: Secondary | ICD-10-CM | POA: Diagnosis present

## 2024-09-27 DIAGNOSIS — Z79899 Other long term (current) drug therapy: Secondary | ICD-10-CM | POA: Diagnosis not present

## 2024-09-27 DIAGNOSIS — E1169 Type 2 diabetes mellitus with other specified complication: Secondary | ICD-10-CM | POA: Diagnosis present

## 2024-09-27 DIAGNOSIS — N170 Acute kidney failure with tubular necrosis: Secondary | ICD-10-CM | POA: Diagnosis present

## 2024-09-27 DIAGNOSIS — Z89431 Acquired absence of right foot: Secondary | ICD-10-CM | POA: Diagnosis not present

## 2024-09-27 DIAGNOSIS — E669 Obesity, unspecified: Secondary | ICD-10-CM | POA: Diagnosis present

## 2024-09-27 DIAGNOSIS — I152 Hypertension secondary to endocrine disorders: Secondary | ICD-10-CM | POA: Diagnosis present

## 2024-09-27 DIAGNOSIS — M109 Gout, unspecified: Secondary | ICD-10-CM | POA: Diagnosis present

## 2024-09-27 DIAGNOSIS — Z7984 Long term (current) use of oral hypoglycemic drugs: Secondary | ICD-10-CM | POA: Diagnosis not present

## 2024-09-27 DIAGNOSIS — N179 Acute kidney failure, unspecified: Secondary | ICD-10-CM | POA: Diagnosis present

## 2024-09-27 DIAGNOSIS — D649 Anemia, unspecified: Secondary | ICD-10-CM | POA: Diagnosis present

## 2024-09-27 DIAGNOSIS — T8743 Infection of amputation stump, right lower extremity: Secondary | ICD-10-CM

## 2024-09-27 DIAGNOSIS — R7881 Bacteremia: Secondary | ICD-10-CM

## 2024-09-27 DIAGNOSIS — M86672 Other chronic osteomyelitis, left ankle and foot: Secondary | ICD-10-CM

## 2024-09-27 DIAGNOSIS — E1159 Type 2 diabetes mellitus with other circulatory complications: Secondary | ICD-10-CM | POA: Diagnosis present

## 2024-09-27 DIAGNOSIS — B9561 Methicillin susceptible Staphylococcus aureus infection as the cause of diseases classified elsewhere: Secondary | ICD-10-CM

## 2024-09-27 DIAGNOSIS — E785 Hyperlipidemia, unspecified: Secondary | ICD-10-CM

## 2024-09-27 DIAGNOSIS — Z87891 Personal history of nicotine dependence: Secondary | ICD-10-CM | POA: Diagnosis not present

## 2024-09-27 DIAGNOSIS — E860 Dehydration: Secondary | ICD-10-CM | POA: Diagnosis present

## 2024-09-27 DIAGNOSIS — N1831 Chronic kidney disease, stage 3a: Secondary | ICD-10-CM | POA: Diagnosis present

## 2024-09-27 DIAGNOSIS — E1165 Type 2 diabetes mellitus with hyperglycemia: Secondary | ICD-10-CM

## 2024-09-27 DIAGNOSIS — M726 Necrotizing fasciitis: Secondary | ICD-10-CM

## 2024-09-27 DIAGNOSIS — I10 Essential (primary) hypertension: Secondary | ICD-10-CM

## 2024-09-27 DIAGNOSIS — Z9181 History of falling: Secondary | ICD-10-CM | POA: Diagnosis not present

## 2024-09-27 DIAGNOSIS — E1122 Type 2 diabetes mellitus with diabetic chronic kidney disease: Secondary | ICD-10-CM | POA: Diagnosis present

## 2024-09-27 DIAGNOSIS — E1140 Type 2 diabetes mellitus with diabetic neuropathy, unspecified: Secondary | ICD-10-CM | POA: Diagnosis present

## 2024-09-27 DIAGNOSIS — Z8614 Personal history of Methicillin resistant Staphylococcus aureus infection: Secondary | ICD-10-CM | POA: Diagnosis not present

## 2024-09-27 DIAGNOSIS — L03116 Cellulitis of left lower limb: Secondary | ICD-10-CM | POA: Diagnosis present

## 2024-09-27 DIAGNOSIS — L97529 Non-pressure chronic ulcer of other part of left foot with unspecified severity: Secondary | ICD-10-CM | POA: Diagnosis present

## 2024-09-27 DIAGNOSIS — F1410 Cocaine abuse, uncomplicated: Secondary | ICD-10-CM | POA: Diagnosis present

## 2024-09-27 LAB — CBC
HCT: 29.6 % — ABNORMAL LOW (ref 39.0–52.0)
Hemoglobin: 9.2 g/dL — ABNORMAL LOW (ref 13.0–17.0)
MCH: 24.9 pg — ABNORMAL LOW (ref 26.0–34.0)
MCHC: 31.1 g/dL (ref 30.0–36.0)
MCV: 80 fL (ref 80.0–100.0)
Platelets: 277 K/uL (ref 150–400)
RBC: 3.7 MIL/uL — ABNORMAL LOW (ref 4.22–5.81)
RDW: 16.2 % — ABNORMAL HIGH (ref 11.5–15.5)
WBC: 14.2 K/uL — ABNORMAL HIGH (ref 4.0–10.5)
nRBC: 0 % (ref 0.0–0.2)

## 2024-09-27 LAB — COMPREHENSIVE METABOLIC PANEL WITH GFR
ALT: 10 U/L (ref 0–44)
AST: 11 U/L — ABNORMAL LOW (ref 15–41)
Albumin: 2.3 g/dL — ABNORMAL LOW (ref 3.5–5.0)
Alkaline Phosphatase: 52 U/L (ref 38–126)
Anion gap: 11 (ref 5–15)
BUN: 27 mg/dL — ABNORMAL HIGH (ref 6–20)
CO2: 22 mmol/L (ref 22–32)
Calcium: 7.9 mg/dL — ABNORMAL LOW (ref 8.9–10.3)
Chloride: 99 mmol/L (ref 98–111)
Creatinine, Ser: 2.73 mg/dL — ABNORMAL HIGH (ref 0.61–1.24)
GFR, Estimated: 26 mL/min — ABNORMAL LOW (ref >60)
Glucose, Bld: 170 mg/dL — ABNORMAL HIGH (ref 70–99)
Potassium: 3.6 mmol/L (ref 3.5–5.1)
Sodium: 132 mmol/L — ABNORMAL LOW (ref 135–145)
Total Bilirubin: 0.3 mg/dL (ref 0.0–1.2)
Total Protein: 6.6 g/dL (ref 6.5–8.1)

## 2024-09-27 LAB — GLUCOSE, CAPILLARY
Glucose-Capillary: 148 mg/dL — ABNORMAL HIGH (ref 70–99)
Glucose-Capillary: 179 mg/dL — ABNORMAL HIGH (ref 70–99)
Glucose-Capillary: 136 mg/dL — ABNORMAL HIGH (ref 70–99)
Glucose-Capillary: 132 mg/dL — ABNORMAL HIGH (ref 70–99)
Glucose-Capillary: 177 mg/dL — ABNORMAL HIGH (ref 70–99)

## 2024-09-27 LAB — MRSA NEXT GEN BY PCR, NASAL: MRSA by PCR Next Gen: DETECTED — AB

## 2024-09-27 MED ORDER — MUPIROCIN 2 % EX OINT
1.0000 | TOPICAL_OINTMENT | Freq: Two times a day (BID) | CUTANEOUS | Status: DC
  Administered 2024-09-27 – 2024-09-29 (×5): 1 via NASAL
  Filled 2024-09-27: qty 22

## 2024-09-27 MED ORDER — CEFAZOLIN SODIUM-DEXTROSE 2-4 GM/100ML-% IV SOLN
2.0000 g | Freq: Three times a day (TID) | INTRAVENOUS | Status: DC
  Administered 2024-09-27 – 2024-09-28 (×4): 2 g via INTRAVENOUS
  Filled 2024-09-27 (×4): qty 100

## 2024-09-27 MED ORDER — CHLORHEXIDINE GLUCONATE CLOTH 2 % EX PADS: 6.0000 | MEDICATED_PAD | Freq: Every day | CUTANEOUS | Status: DC

## 2024-09-27 MED ORDER — OXYCODONE-ACETAMINOPHEN 5-325 MG PO TABS
1.0000 | ORAL_TABLET | ORAL | Status: DC | PRN
  Administered 2024-09-27 – 2024-09-29 (×7): 2 via ORAL
  Filled 2024-09-27 (×8): qty 2

## 2024-09-27 MED ORDER — GADOBUTROL 1 MMOL/ML IV SOLN
10.0000 mL | Freq: Once | INTRAVENOUS | Status: AC | PRN
  Administered 2024-09-27: 10 mL via INTRAVENOUS

## 2024-09-27 MED ORDER — VANCOMYCIN VARIABLE DOSE PER UNSTABLE RENAL FUNCTION (PHARMACIST DOSING): Status: DC

## 2024-09-27 MED ORDER — OXYCODONE-ACETAMINOPHEN 5-325 MG PO TABS
1.0000 | ORAL_TABLET | Freq: Once | ORAL | Status: AC
  Administered 2024-09-27: 1 via ORAL

## 2024-09-27 NOTE — Plan of Care (Signed)

## 2024-09-27 NOTE — Progress Notes (Signed)
 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 and [place] Continued cocaine habituation (snorts) He has never been diagnosed with gout Complicated right and left foot history as summarized below  10/24 several falls --- initially had 4-day history of right knee pain and then sustained a fall did not hit head-hypotensive injury--on admit AKI BUN/creatinine 26/2.7 bicarb normal  WBC 15.5 hemoglobin 10.6 platelet 320  Chronology  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 by [name]   04/18/24 post op wound dehiscence - underwent debridement MRSA bacteremia  04/23/24 - underwent Rt TMA Treated with 2+ weeks Of Iv dapto/mero- was doing cocaine as OP and was transiently  in CT and missed apptsand then got PICC removed in [place]   06/05/24 dehiscence of wound and underwent delayed primary closure 06/29/24 revision TMA Sent to SNF on 07/14/24 with 6 weeks of Iv dapto and ertapenem  until 9/3    Left foot Chronic left foot wounds since 05/2022 with extensive debridement multiple courses of Iv antibiotics- BKA recommended which he refused- was on a prolonged course of PO antibiotics for osteo--wounds are better with no infections  and not an issue now Recent graft on 06/27/24   Pertinent imaging/studies till date  Knee  x-ray right side no acute osseous abnormality Foot x-ray left side chronic deformity 2nd through 5th metatarsals sclerosis of 2nd, 3rd and 5th metatarsal suggestive chronic osteomyelitis lucency over second third MTP joint of metatarsals and bases of proximal phalanges likely secondary to superimposed soft tissue ulcer cannot exclude erosive osteomyelitis MRI left foot pending   Assessment  &  Plan :    Significant right knee pain concerning for septic arthritis Will start with MRI of the right knee I have already spoken to Dr. Lindia of infectious disease  Dr. Lindia /Dr Harden could discuss antibiotics-I have left the patient on Ancef  and vancomycin  for now Hopeful to avoid surgery but unlikely ---dry [place] tap was performed on 10/24  Pain control with oxycodone  1-2 tabs every 4 as needed, hydromorphone  0.5 Q3 as needed severe pain Likely osteomyelitis acute of left foot MRI read of left foot is pending Patient historically has been reticent to consider any amputation AKI on admission Kidney function about the same and likely secondary to ATN-narrowing of vancomycin  has been stable soon Continue saline 100 cc/H May require bladder scan Diabetes mellitus with neuropathy Continue 15 units twice daily of NPH insulin  sliding scale No longer candidate for metformin  given kidney function Continue gabapentin  300 twice daily Cocaine habituation Needs to quit  Data Reviewed today:  WBC 15 hemoglobin 9.2 platelets 277 Sodium 132 potassium 3.6 BUN/creatinine 27/2.7   DVT prophylaxis: SCD  Status is: Observation The patient will require care spanning > 2 midnights and should be moved to inpatient because:  Will require inpatient management Dispo/Global plan: Unclear at this time   Time 50   Subjective:   Severe knee pain-this was a main presenting complaint from home No benefit with colchicine No nausea no vomiting    Objective + exam Vitals:   09/26/24 2058 09/27/24 0055 09/27/24 0412 09/27/24 0827  BP: 123/78 100/71 122/81 121/77  Pulse: 89 89 84 90  Resp:  18 17 18 19   Temp: 99.4 F (37.4 C) 99 F (37.2 C) 98.8 F (37.1 C) 98.1 F (36.7 C)  TempSrc: Oral Oral Oral   SpO2: 98% 97% 100% 96%  Weight: 107.2 kg     Height: 5' 10 (1.778 m)      Filed Weights   09/26/24 2058  Weight: 107.2 kg     Examination: EOMI NCAT coherent awake alert pleasant  black male No distress ROM intact no focal deficit Repeat woozy left foot not examined foul-smelling from doorway Very tender and red aware appearing right knee however not ballotable and not significant amount of fluid  Scheduled Meds:  Chlorhexidine  Gluconate Cloth  6 each Topical Daily   gabapentin   300 mg Oral BID   insulin  aspart  0-15 Units Subcutaneous TID WC   insulin  aspart  0-5 Units Subcutaneous QHS   insulin  NPH Human  15 Units Subcutaneous BID AC & HS   mupirocin  ointment  1 Application Nasal BID   sodium chloride  flush  3 mL Intravenous Q12H   vancomycin  variable dose per unstable renal function (pharmacist dosing)   Does not apply See admin instructions   Continuous Infusions:  sodium chloride  100 mL/hr at 09/27/24 0731   cefTRIAXone  (ROCEPHIN )  IV 2 g (09/27/24 0958)   metronidazole  Stopped (09/27/24 0711)   HYDROmorphone  (DILAUDID ) injection, melatonin, oxyCODONE -acetaminophen , polyethylene glycol  Jai-Gurmukh Breeonna Mone, MD  [facility]

## 2024-09-27 NOTE — Consult Note (Signed)
 WOC Nurse Consult Note: Reason for Consult: requested to assess wound on L foot. Performed remotely evaluation of photos and notes. Wound type: DFU - Neuropathic ulcer, great metatarsal. (BKA recommended) Follow by dr Harden. Pressure Injury POA: NA Measurement: 4.5 cm x 4 cm x 0.5 cm. Wound bed: 100% non-granulate tissue, dark red. Bone exposition. Drainage (amount, consistency, odor) Moderate. See flowsheet. Periwound: callus. Dressing procedure/placement/frequency: Cleanse the skin with Vashe #[id number], not rinse, pat dry. Apply a moist gauze with Vashe into the wound bed, top with Foam dressing, wrap with Kerlix. Change daily.  WOC team will not plan to follow further. Please reconsult if further assistance is needed. Thank-you,  Lela Holm RN, CNS, [employer], MSN.  (Phone [phone])

## 2024-09-27 NOTE — Plan of Care (Signed)
  Problem: Education: Goal: Knowledge of General Education information will improve Description: Including pain rating scale, medication(s)/side effects and non-pharmacologic comfort measures 09/27/2024 0107 by Flynn Paralee PARAS, RN Outcome: Progressing 09/26/2024 2218 by Flynn Paralee PARAS, RN Outcome: Progressing   Problem: Clinical Measurements: Goal: Respiratory complications will improve 09/27/2024 0107 by Flynn Paralee PARAS, RN Outcome: Progressing 09/26/2024 2218 by Flynn Paralee PARAS, RN Outcome: Progressing   Problem: Nutrition: Goal: Adequate nutrition will be maintained 09/27/2024 0107 by Flynn Paralee PARAS, RN Outcome: Progressing 09/26/2024 2218 by Flynn Paralee PARAS, RN Outcome: Progressing

## 2024-09-27 NOTE — Hospital Course (Signed)
 Measurement: 4.5 cm x 4 cm x 0.5 cm. Wound bed: 100% non-granulate tissue, dark red. Bone exposition. Drainage (amount, consistency, odor) Moderate. See flowsheet. Periwound: callus. Dressing procedure/placement/frequency: Cleanse the skin with Vashe #[id number], not rinse, pat dry. Apply a moist gauze with Vashe into the wound bed, top with Foam dressing, wrap with Kerlix. Change daily.

## 2024-09-27 NOTE — Consult Note (Addendum)
 Date of Admission:  09/26/2024          Reason for Consult: concern for septic right knee in patient with chronic osteomyelitis involving left foot    Referring Provider: Reggie Grimes, MD   Assessment:  Rule out arthritis of right knee due to hematogenous spread from his Left foot with chronic osteomyelitis with post debridement and June 2023, multiple rounds of antibiotics Right foot necrotic infection requiring first foot ray amputation in April 2025 with further debridement in May 2025 then further surgery with transmetatarsal amputation in early July, then admission in late July with infected TMA and MRSA bacteremia sp revision of transmetatarsal tarsal amputation with polymicrobial infection status post daptomycin  and ertapenem  Acute on chronic kidney disease Poorly controlled diabetes mellitus. HTN HYperlipidemia Hx of cocaine use  Plan:  Discontinue antibiotics Have changed the MRI of the right knee that being 1 with and without contrast which will give us  more information.  I double checked with the radiology and his creatinine is at a level it is safe to give contrast with the new or contrast that they have Follow-up on occult blood cultures After MRI knee obtained if there is indeed an effusion not seen by XR or US  would ask surgery to aspirate the knee (or IR) with fluid sent for priority #1 cell count and differential, crystals and also culture Check ESR, CRP  Principal Problem:   Right knee pain Active Problems:   Obesity (BMI 30-39.9)   Hypertension associated with diabetes (HCC)   HLD (hyperlipidemia)   Chronic kidney disease, stage 3a (HCC)   Normocytic anemia   Uncontrolled type 2 diabetes mellitus with hyperglycemia, with long-term current use of insulin  (HCC)   Acute renal failure superimposed on stage 3a chronic kidney disease, unspecified acute renal failure type (HCC)   Scheduled Meds:  Chlorhexidine  Gluconate Cloth  6 each Topical Daily    gabapentin   300 mg Oral BID   insulin  aspart  0-15 Units Subcutaneous TID WC   insulin  aspart  0-5 Units Subcutaneous QHS   insulin  NPH Human  15 Units Subcutaneous BID AC & HS   mupirocin  ointment  1 Application Nasal BID   sodium chloride  flush  3 mL Intravenous Q12H   vancomycin  variable dose per unstable renal function (pharmacist dosing)   Does not apply See admin instructions   Continuous Infusions:  sodium chloride  100 mL/hr at 09/27/24 0731    ceFAZolin  (ANCEF ) IV     PRN Meds:.HYDROmorphone  (DILAUDID ) injection, melatonin, oxyCODONE -acetaminophen , polyethylene glycol  HPI: [name] is a 58 y.o. male with history of diabetes mellitus osteomyelitis involving both feet who initially had left foot irrigation debridement by Dr. Harden on May 05, 2022. took 5 months of antibiotics.  He was told that time he needed an amputation I believe a below the knee amputation.  He however was adamantly against this and ultimately transferred care to [facility] wound center where he started hyperbaric oxygen  he had received 9 treatments but then stopped as he could not make the 5 times per week required.  He completed cefadroxil  levofloxacin  and Flagyl  x 4 months and prior to that 8 weeks of IV antibiotics oral antibiotics were stopped on October 31, 2022.  He then moved to [place] in February 2024.  He is wound flared up in his left foot and he was hospitalized in [place] on July 2024 and was released on vancomycin  and ceftriaxone  and then switched over to oral Zyvox  and  Augmentin .  He completed antibiotics and September October 2024 and then was off antibiotics.  In the interim he underwent right first foot ray amputation with Dr. Harden on March 26, 2024.,  He required further debridement after wound dehiscence on Apr 18, 2024  Further dehiscence of his transmetatarsal amputation and underwent delayed primary closure right foot with excision of distal metatarsal 234 and 5 the right foot.  He then was  admitted in the hospital in June 26, 2024 with infected right TMA site and had MRSA bacteremia he underwent revision of the transmetatarsal amputation his cultures from the his right foot had yielded an ESBL Klebsiella, Staphylococcus aureus providence,.  He was treated with IV daptomycin  and IV ertapenem  and completed 6 weeks on August 06, 2024.  He has been followed very closely by [name] at [facility].  He was doing relatively well when she saw him last on 10 September and did not seem clinically to have active infection.  In the interim the patient woke up in the morning with acute onset of right knee pain and swelling.  When he got up to try to walk he was not able to bear weight on this leg and he fell multiple times.  He ultimately came to the hospital due to his knee pain.  Note the patient himself has not noticed any worsening of his chronic left lower extremity wound but in the ER there was noted to have malodorous material.  Labs on admission were pertinent for a white count of 14,200 and acute renal insufficiency.  Attempts were made by ER staff to aspirate the joint under ultrasound guidance but were unsuccessful as they could not appreciate an effusion under ultrasound.  Plain films also did not show an effusion.  He was admitted to the hospital and given vancomycin  and ceftriaxone  and metronidazole .  Plain films of the left foot performed yesterday showed a large ulcer in the mid to distal foot with no acute fracture with interval increased periosteal reaction at the head of the first metatarsal and base of the first proximal phalanx and lateral side possibly due to osteomyelitis along with chronic deformities of the 2nd through 5th metatarsals with sclerosis of 2nd, 3rd and 5th metatarsals suggesting chronic osteomyelitis.  There is also lucency overlying the second third MTP joints heads and metatarsal bases of the proximal phalanges likely due to  superimposed soft tissue ulceration.   MRI of the left foot has been performed has not yet been read.  He is going to have an MRI of the right knee without contrast which is ordered.  There has been thought that he may have gout though has no history of it it is not obvious on questioning him that he has many risk factors for it.  I agree with Dr. Samtani that concern remains for knee native septic arthritis or other infectious or noninfectious pathology here despite failed attempts to aspirate knee and revealing plain films.  Given his chronic osteomyelitis in his left foot he certainly could have had hematogenous seeding of his right knee.  I agree with MRI of the right knee and we can see if this can be potentially changed to with and without contrast. I checked with my tech and indeed he can have an MRI of the knee with and without contrast and there was could give us  more information the 1 without contrast his GFR is sufficiently high to allow him to have contrast which is why he had  it earlier.  (I did want to double check though)  MRI may not happen until tomorrow to allow current contrast to leave system sufficiently   I am dcing antibiotics to increase yield on potential aspirate of the right knee for cell count and differential, crystals and culture.  Would pay close attention to his blood cultures.   I have personally spent 84 minutes involved in face-to-face and non-face-to-face activities for this patient on the day of the visit. Professional time spent includes the following activities: Preparing to see the patient (review of tests), Obtaining and/or reviewing separately obtained history (admission/discharge record), Performing a medically appropriate examination and/or evaluation , Ordering medications/tests/procedures, referring and communicating with other health care professionals, Documenting clinical information in the EMR, Independently interpreting results (not  separately reported), Communicating results to the patient/family/caregiver, Counseling and educating the patient/family/caregiver and Care coordination (not separately reported).   Evaluation of the patient requires complex antimicrobial therapy evaluation, counseling , isolation needs to reduce disease transmission and risk assessment and mitigation.     Review of Systems: Review of Systems  Constitutional:  Negative for chills, fever, malaise/fatigue and weight loss.  HENT:  Negative for congestion and sore throat.   Eyes:  Negative for blurred vision and photophobia.  Respiratory:  Negative for cough, shortness of breath and wheezing.   Cardiovascular:  Negative for chest pain, palpitations and leg swelling.  Gastrointestinal:  Negative for abdominal pain, blood in stool, constipation, diarrhea, heartburn, melena, nausea and vomiting.  Genitourinary:  Negative for dysuria, flank pain and hematuria.  Musculoskeletal:  Positive for falls and joint pain. Negative for back pain and myalgias.  Skin:  Negative for itching and rash.  Neurological:  Negative for dizziness, focal weakness, loss of consciousness, weakness and headaches.  Endo/Heme/Allergies:  Does not bruise/bleed easily.  Psychiatric/Behavioral:  Negative for depression and suicidal ideas. The patient does not have insomnia.     Past Medical History:  Diagnosis Date   Amputation of right great toe    Cellulitis and abscess of foot    CKD stage 3a, GFR 45-59 ml/min (HCC)    Cocaine abuse (HCC)    + UDS on 05-12-24   DM (diabetes mellitus), type 2 (HCC)    ESBL (extended spectrum beta-lactamase) producing bacteria infection 04/18/2024   Hyperlipidemia    Hypertension    Methicillin resistant Staphylococcus aureus infection 07/14/2024   MRSA bacteremia    Normocytic anemia    Obesity    Tobacco abuse    Toe osteomyelitis, left (HCC)    Tuberculosis    tested positive,treated with medications.    Social History    Tobacco Use   Smoking status: Former    Current packs/day: 0.00    Average packs/day: 0.3 packs/day for 25.0 years (6.3 ttl pk-yrs)    Types: Cigarettes    Start date: 09/17/1997    Quit date: 09/17/2022    Years since quitting: 2.0   Smokeless tobacco: Never   Tobacco comments:    Quit  Vaping Use   Vaping status: Never Used  Substance Use Topics   Alcohol use: No   Drug use: No    Family History  Problem Relation Age of Onset   Colon cancer Neg Hx    Colon polyps Neg Hx    Crohn's disease Neg Hx    Esophageal cancer Neg Hx    Rectal cancer Neg Hx    Stomach cancer Neg Hx    Ulcerative colitis Neg Hx    No  Known Allergies  OBJECTIVE: Blood pressure 121/77, pulse 90, temperature 98.1 F (36.7 C), resp. rate 19, height 5' 10 (1.778 m), weight 107.2 kg, SpO2 96%.  Physical Exam Constitutional:      Appearance: He is well-developed.  HENT:     Head: Normocephalic and atraumatic.  Eyes:     Conjunctiva/sclera: Conjunctivae normal.  Cardiovascular:     Rate and Rhythm: Normal rate and regular rhythm.  Pulmonary:     Effort: Pulmonary effort is normal. No respiratory distress.     Breath sounds: No wheezing.  Abdominal:     General: There is no distension.     Palpations: Abdomen is soft.  Musculoskeletal:     Cervical back: Normal range of motion and neck supple.     Right knee: Swelling present. Decreased range of motion. Tenderness present.  Skin:    General: Skin is warm and dry.     Coloration: Skin is not pale.     Findings: No erythema or rash.  Neurological:     General: No focal deficit present.     Mental Status: He is alert and oriented to person, place, and time.  Psychiatric:        Mood and Affect: Mood normal.        Behavior: Behavior normal.        Thought Content: Thought content normal.        Judgment: Judgment normal.    Left foot based on pictures        Lab Results Lab Results  Component Value Date   WBC 14.2 (H)  09/27/2024   HGB 9.2 (L) 09/27/2024   HCT 29.6 (L) 09/27/2024   MCV 80.0 09/27/2024   PLT 277 09/27/2024    Lab Results  Component Value Date   CREATININE 2.73 (H) 09/27/2024   BUN 27 (H) 09/27/2024   NA 132 (L) 09/27/2024   K 3.6 09/27/2024   CL 99 09/27/2024   CO2 22 09/27/2024    Lab Results  Component Value Date   ALT 10 09/27/2024   AST 11 (L) 09/27/2024   ALKPHOS 52 09/27/2024   BILITOT 0.3 09/27/2024     Microbiology: Recent Results (from the past 240 hours)  Culture, blood (routine x 2)     Status: None (Preliminary result)   Collection Time: 09/26/24  5:22 PM   Specimen: BLOOD  Result Value Ref Range Status   Specimen Description BLOOD BLOOD LEFT ARM  Final   Special Requests   Final    AERB Blood Culture results may not be optimal due to an inadequate volume of blood received in culture bottles   Culture   Final    NO GROWTH < 24 HOURS Performed at [facility], [address], [place], [place] [address]    Report Status PENDING  Incomplete  MRSA Next Gen by PCR, Nasal     Status: Abnormal   Collection Time: 09/26/24 11:51 PM   Specimen: Nasal Mucosa; Nasal Swab  Result Value Ref Range Status   MRSA by PCR Next Gen DETECTED (A) NOT DETECTED Final    Comment: RESULTS CALLED TO, READ BACK BY AND VERIFIED WITH RN T.ROSS ON 09/27/24 AT 0140 BY NM (NOTE) The GeneXpert MRSA Assay (FDA approved for NASAL specimens only), is one component of a comprehensive MRSA colonization surveillance program. It is not intended to diagnose MRSA infection nor to guide or monitor treatment for MRSA infections. Test performance is not FDA approved in patients less  than 6 years old. Performed at [facility], [address] [place], [place] [address]     Jomarie Fleeta Rothman, MD [facility] [phone] pager  09/27/2024, 2:24 PM

## 2024-09-28 ENCOUNTER — Inpatient Hospital Stay (HOSPITAL_COMMUNITY)

## 2024-09-28 DIAGNOSIS — M25561 Pain in right knee: Secondary | ICD-10-CM | POA: Diagnosis not present

## 2024-09-28 LAB — BASIC METABOLIC PANEL WITH GFR
Anion gap: 10 (ref 5–15)
BUN: 26 mg/dL — ABNORMAL HIGH (ref 6–20)
CO2: 23 mmol/L (ref 22–32)
Calcium: 8.1 mg/dL — ABNORMAL LOW (ref 8.9–10.3)
Chloride: 99 mmol/L (ref 98–111)
Creatinine, Ser: 2.15 mg/dL — ABNORMAL HIGH (ref 0.61–1.24)
GFR, Estimated: 35 mL/min — ABNORMAL LOW (ref >60)
Glucose, Bld: 192 mg/dL — ABNORMAL HIGH (ref 70–99)
Potassium: 3.7 mmol/L (ref 3.5–5.1)
Sodium: 132 mmol/L — ABNORMAL LOW (ref 135–145)

## 2024-09-28 LAB — CBC WITH DIFFERENTIAL/PLATELET
Abs Immature Granulocytes: 0.08 K/uL — ABNORMAL HIGH (ref 0.00–0.07)
Basophils Absolute: 0 K/uL (ref 0.0–0.1)
Basophils Relative: 0 %
Eosinophils Absolute: 0.1 K/uL (ref 0.0–0.5)
Eosinophils Relative: 1 %
HCT: 27.5 % — ABNORMAL LOW (ref 39.0–52.0)
Hemoglobin: 8.5 g/dL — ABNORMAL LOW (ref 13.0–17.0)
Immature Granulocytes: 1 %
Lymphocytes Relative: 11 %
Lymphs Abs: 1.2 K/uL (ref 0.7–4.0)
MCH: 24.6 pg — ABNORMAL LOW (ref 26.0–34.0)
MCHC: 30.9 g/dL (ref 30.0–36.0)
MCV: 79.7 fL — ABNORMAL LOW (ref 80.0–100.0)
Monocytes Absolute: 1 K/uL (ref 0.1–1.0)
Monocytes Relative: 9 %
Neutro Abs: 8.3 K/uL — ABNORMAL HIGH (ref 1.7–7.7)
Neutrophils Relative %: 78 %
Platelets: 272 K/uL (ref 150–400)
RBC: 3.45 MIL/uL — ABNORMAL LOW (ref 4.22–5.81)
RDW: 16.4 % — ABNORMAL HIGH (ref 11.5–15.5)
WBC: 10.6 K/uL — ABNORMAL HIGH (ref 4.0–10.5)
nRBC: 0 % (ref 0.0–0.2)

## 2024-09-28 LAB — GLUCOSE, CAPILLARY
Glucose-Capillary: 171 mg/dL — ABNORMAL HIGH (ref 70–99)
Glucose-Capillary: 212 mg/dL — ABNORMAL HIGH (ref 70–99)
Glucose-Capillary: 140 mg/dL — ABNORMAL HIGH (ref 70–99)
Glucose-Capillary: 223 mg/dL — ABNORMAL HIGH (ref 70–99)

## 2024-09-28 LAB — VANCOMYCIN, RANDOM: Vancomycin Rm: 7 ug/mL

## 2024-09-28 MED ORDER — GADOBUTROL 1 MMOL/ML IV SOLN
10.0000 mL | Freq: Once | INTRAVENOUS | Status: AC | PRN
Start: 2024-09-28 — End: 2024-09-28
  Administered 2024-09-28: 10 mL via INTRAVENOUS

## 2024-09-28 NOTE — Plan of Care (Signed)

## 2024-09-28 NOTE — Progress Notes (Signed)
-----------------------------------------------------------  [facility]--------------------------------------------------- D(Data) A(Action) R(response)     Data: Patient had MRI of Knee ordered on 10/25.    Action: Recv'd secure chat from Dr Royal to follow up on MRI not being completed.    Response: Contacted MRI at [facility], patient in transport to have MRI completed at this time. Updated Dr Royal & Dr Kenard.     ALEC Lim, RN [facility]

## 2024-09-28 NOTE — Progress Notes (Addendum)
 Subjective:, No new complaints, going for MRI right knee, knee pain is also remarkably better and nice ROM   Antibiotics:  Anti-infectives (From admission, onward)    Start     Dose/Rate Route Frequency Ordered Stop   09/27/24 1445  ceFAZolin  (ANCEF ) IVPB 2g/100 mL premix        2 g 200 mL/hr over 30 Minutes Intravenous Every 8 hours 09/27/24 1353 10/04/24 1359   09/27/24 1353  vancomycin  variable dose per unstable renal function (pharmacist dosing)         Does not apply See admin instructions 09/27/24 1353     09/27/24 1000  cefTRIAXone  (ROCEPHIN ) 2 g in sodium chloride  0.9 % 100 mL IVPB  Status:  Discontinued        2 g 200 mL/hr over 30 Minutes Intravenous Every 24 hours 09/26/24 1750 09/27/24 1349   09/26/24 1913  vancomycin  variable dose per unstable renal function (pharmacist dosing)  Status:  Discontinued         Does not apply See admin instructions 09/26/24 1913 09/27/24 1349   09/26/24 1800  metroNIDAZOLE  (FLAGYL ) IVPB 500 mg  Status:  Discontinued        500 mg 100 mL/hr over 60 Minutes Intravenous Every 12 hours 09/26/24 1750 09/27/24 1349   09/26/24 1530  vancomycin  (VANCOREADY) IVPB 1750 mg/350 mL        1,750 mg 175 mL/hr over 120 Minutes Intravenous  Once 09/26/24 1516 09/26/24 2135   09/26/24 1515  cefTRIAXone  (ROCEPHIN ) 2 g in sodium chloride  0.9 % 100 mL IVPB        2 g 200 mL/hr over 30 Minutes Intravenous  Once 09/26/24 1512 09/26/24 1638       Medications: Scheduled Meds:  Chlorhexidine  Gluconate Cloth  6 each Topical Daily   gabapentin   300 mg Oral BID   insulin  aspart  0-15 Units Subcutaneous TID WC   insulin  aspart  0-5 Units Subcutaneous QHS   insulin  NPH Human  15 Units Subcutaneous BID AC & HS   mupirocin  ointment  1 Application Nasal BID   sodium chloride  flush  3 mL Intravenous Q12H   vancomycin  variable dose per unstable renal function (pharmacist dosing)   Does not apply See admin instructions   Continuous Infusions:    ceFAZolin  (ANCEF ) IV 2 g (09/28/24 0514)   PRN Meds:.HYDROmorphone  (DILAUDID ) injection, melatonin, oxyCODONE -acetaminophen , polyethylene glycol    Objective: Weight change:   Intake/Output Summary (Last 24 hours) at 09/28/2024 1148 Last data filed at 09/28/2024 0600 Gross per 24 hour  Intake 2180 ml  Output 1100 ml  Net 1080 ml   Blood pressure (!) 137/116, pulse 97, temperature 98.2 F (36.8 C), resp. rate 19, height 5' 10 (1.778 m), weight 107.2 kg, SpO2 97%. Temp:  [98.1 F (36.7 C)-98.3 F (36.8 C)] 98.2 F (36.8 C) (10/26 0734) Pulse Rate:  [88-97] 97 (10/26 0734) Resp:  [18-19] 19 (10/26 0734) BP: (121-137)/(75-116) 137/116 (10/26 0734) SpO2:  [94 %-99 %] 97 % (10/26 0734)  Physical Exam: Physical Exam Constitutional:      Appearance: Normal appearance.  HENT:     Head: Normocephalic and atraumatic.  Eyes:     General:        Right eye: No discharge.        Left eye: No discharge.     Extraocular Movements: Extraocular movements intact.     Pupils: Pupils are equal, round, and reactive to light.  Cardiovascular:  Rate and Rhythm: Normal rate and regular rhythm.  Pulmonary:     Effort: No respiratory distress.     Breath sounds: No wheezing.  Abdominal:     General: There is no distension.  Musculoskeletal:     Cervical back: Normal range of motion and neck supple.     Comments: ROM in knee much much better  Skin:    General: Skin is warm and dry.  Neurological:     General: No focal deficit present.     Mental Status: He is alert and oriented to person, place, and time.  Psychiatric:        Mood and Affect: Mood normal.        Behavior: Behavior normal.        Thought Content: Thought content normal.        Judgment: Judgment normal.      CBC:    BMET Recent Labs    09/27/24 0432 09/28/24 0417  NA 132* 132*  K 3.6 3.7  CL 99 99  CO2 22 23  GLUCOSE 170* 192*  BUN 27* 26*  CREATININE 2.73* 2.15*  CALCIUM  7.9* 8.1*     Liver  Panel  Recent Labs    09/26/24 1548 09/27/24 0432  PROT 7.4 6.6  ALBUMIN 2.6* 2.3*  AST 20 11*  ALT 11 10  ALKPHOS 65 52  BILITOT 1.1 0.3  BILIDIR 0.3*  --   IBILI 0.8  --        Sedimentation Rate No results for input(s): ESRSEDRATE in the last 72 hours. C-Reactive Protein No results for input(s): CRP in the last 72 hours.  Micro Results: Recent Results (from the past 720 hours)  Culture, blood (routine x 2)     Status: None (Preliminary result)   Collection Time: 09/26/24  3:08 PM   Specimen: BLOOD  Result Value Ref Range Status   Specimen Description BLOOD RIGHT ANTECUBITAL  Final   Special Requests   Final    BOTTLES DRAWN AEROBIC AND ANAEROBIC Blood Culture adequate volume   Culture   Final    NO GROWTH 2 DAYS Performed at [facility], [address] [place], [place] [address]    Report Status PENDING  Incomplete  Culture, blood (routine x 2)     Status: None (Preliminary result)   Collection Time: 09/26/24  5:22 PM   Specimen: BLOOD  Result Value Ref Range Status   Specimen Description BLOOD BLOOD LEFT ARM  Final   Special Requests   Final    AERB Blood Culture results may not be optimal due to an inadequate volume of blood received in culture bottles   Culture   Final    NO GROWTH 2 DAYS Performed at [facility], [address], [place], [place] [address]    Report Status PENDING  Incomplete  MRSA Next Gen by PCR, Nasal     Status: Abnormal   Collection Time: 09/26/24 11:51 PM   Specimen: Nasal Mucosa; Nasal Swab  Result Value Ref Range Status   MRSA by PCR Next Gen DETECTED (A) NOT DETECTED Final    Comment: RESULTS CALLED TO, READ BACK BY AND VERIFIED WITH RN T.ROSS ON 09/27/24 AT 0140 BY NM (NOTE) The GeneXpert MRSA Assay (FDA approved for NASAL specimens only), is one component of a comprehensive MRSA colonization surveillance program. It is not intended to diagnose MRSA infection nor to guide or monitor treatment for MRSA  infections. Test performance is not FDA  approved in patients less than 29 years old. Performed at [facility], [address] [place], [place] [address]     Studies/Results: MR FOOT LEFT W WO CONTRAST Result Date: 09/28/2024 CLINICAL DATA:  Osteonecrosis suspected, foot, xray done History of left foot irrigation and debridement procedure 06/29/2024. EXAM: MRI OF THE LEFT FOREFOOT WITHOUT AND WITH CONTRAST TECHNIQUE: Multiplanar, multisequence MR imaging of the left forefoot was performed both before and after administration of intravenous contrast. CONTRAST:  10mL GADAVIST  GADOBUTROL  1 MMOL/ML IV SOLN COMPARISON:  Radiographs 09/26/2024 and 06/29/2024. MRI 06/21/2024 and CT 03/25/2024. FINDINGS: Technical note: Despite efforts by the technologist and patient, mild motion artifact is present on today's exam and could not be eliminated. This reduces exam sensitivity and specificity. Bones/Joint/Cartilage Progressive soft tissue ulcer or surgical wound involving the plantar aspect of the forefoot at the level of the 2nd metatarsal. Soft tissue findings are further described below. Underlying chronic ankylosis of the 2nd metatarsophalangeal joint with chronic deformities of the distal 2nd through 5th metatarsals. Compared with the previous MRI, there is progressive cortical destruction along the fused 2nd metatarsophalangeal joint with increased T2 hyperintensity, decreased T1 hyperintensity and heterogeneous bone marrow enhancement, consistent with progressive osteomyelitis. Signal changes within the 3rd metatarsal head and base of the 3rd proximal phalanx are similar to previous MRI, likely indicative of chronic osteomyelitis. Likewise, chronic deformity of the distal 5th metatarsal with associated chronic marrow changes, also likely related to chronic osteomyelitis. No other progressive bone destruction identified. Ligaments Intact Lisfranc ligament. The collateral ligaments of the 2nd, 3rd and 5th  metatarsophalangeal joints are not well visualized. Muscles and Tendons Mild generalized muscular edema and atrophy. No significant tenosynovitis identified. Soft tissues As above, enlarging soft tissue ulcer or surgical wound along the plantar aspect of the 2nd metatarsal head extending to the fused 2nd metatarsophalangeal joint. There are progressive soft tissue inflammatory changes surrounding the 2nd metatarsophalangeal joint and 2nd proximal phalanx with a complex fluid collection plantar to the 2nd proximal interphalangeal joint. This measures up to 1.4 cm in diameter and is associated with air bubbles and peripheral and enhancement, suspicious for a small abscess. Additional chronic soft tissue ulcer along the plantar aspect of the distal 5th metatarsal. Chronic dermal and subcutaneous thickening in the medial plantar aspect of the forefoot. No other focal fluid collections are identified. IMPRESSION: 1. Enlarging soft tissue ulcer or surgical wound along the plantar aspect of the 2nd metatarsal head extending to the fused 2nd metatarsophalangeal joint. There are progressive soft tissue inflammatory changes surrounding the 2nd metatarsophalangeal joint and 2nd proximal phalanx with a small complex fluid collection plantar to the 2nd proximal interphalangeal joint, suspicious for a small abscess. 2. Progressive cortical destruction along the fused 2nd metatarsophalangeal joint consistent with progressive osteomyelitis. 3. Chronic osteomyelitis of the 3rd metatarsal head, base of the 3rd proximal phalanx and distal 5th metatarsal, similar to previous MRI. 4. Additional chronic soft tissue ulcer along the plantar aspect of the distal 5th metatarsal. Electronically Signed   By: Elsie Perone M.D.   On: 09/28/2024 11:04   DG Foot Complete Left Result Date: 09/26/2024 CLINICAL DATA:  Fall EXAM: LEFT FOOT - COMPLETE 3+ VIEW COMPARISON:  06/29/2024, 03/25/2024 FINDINGS: Large ulcer/wound plantar aspect of the  mid to distal foot. No fracture is seen. Chronic deformity of the fifth metatarsal. Chronic deformities of the second third and fourth metatarsal heads. Interval increased periosteal reaction at the head of the first metatarsal and base of first proximal phalanx  on the lateral side. There appears to be ankylosis across the second and third MTP joints. Lucency at the level of the second and third MTP joint/metatarsal head/bases of the second and third proximal phalanges on the oblique view, some of which may be due to soft tissue ulcer. Sclerosis of the second third and fifth metatarsals which may be due to chronic osteomyelitis. Possible increased periosteal reaction along the shaft of the fifth metatarsal on lateral view. IMPRESSION: 1. Large ulcer/wound plantar aspect of the mid to distal foot. No acute fracture is seen. 2. Interval increased periosteal reaction at the head of the first metatarsal and base of first proximal phalanx on the lateral side, possible osteomyelitis. 3. Chronic deformities of the second through fifth metatarsals. Sclerosis of the second third and fifth metatarsal suggesting chronic osteomyelitis. Lucency overlying the second and third MTP joints/heads of metatarsals/bases of proximal phalanges probably in part due to superimposed soft tissue ulcer but cannot exclude erosive change related to osteomyelitis. Consider MRI follow-up. Electronically Signed   By: Luke Bun M.D.   On: 09/26/2024 15:52   DG Knee Complete 4 Views Right Result Date: 09/26/2024 EXAM: 4 OR MORE VIEW(S) XRAY OF THE R KNEE 09/26/2024 02:49:00 PM COMPARISON: None available. CLINICAL HISTORY: R knee pain and swelling, erythema. Per triage notes: Patient has R knee pain without injury x 4 days, causing him to fall twice yesterday. Did not hit head or pass out and denies other injury from falls. FINDINGS: BONES AND JOINTS: No acute fracture. No focal osseous lesion. No joint dislocation. No significant joint  effusion. No significant degenerative changes. SOFT TISSUES: The soft tissues are unremarkable. IMPRESSION: 1. No acute osseous abnormality of the knee. Electronically signed by: Selinda Blue MD 09/26/2024 03:21 PM EDT RP Workstation: [id number]      Assessment/Plan:  INTERVAL HISTORY: knee much better   Principal Problem:   Right knee pain Active Problems:   Obesity (BMI 30-39.9)   Hypertension associated with diabetes (HCC)   HLD (hyperlipidemia)   Chronic kidney disease, stage 3a (HCC)   Normocytic anemia   Uncontrolled type 2 diabetes mellitus with hyperglycemia, with long-term current use of insulin  (HCC)   Acute renal failure superimposed on stage 3a chronic kidney disease, unspecified acute renal failure type (HCC)    [name] is a 58 y.o. male with with history of diabetes mellitus osteomyelitis involving both feet who had has had extensive surgeries in the left side and been told he needed below the knee amputation but declined.  Consult note yesterday details all of the details of his story.  He more recently underwent right foot first ray amputation with Dr. Harden in April with dehiscence requiring incision of the distal metatarsal 234 and 5 the right foot then admitted in July and underwent transmetatarsal amputation site revision.  Been followed by [name] at [facility] and was doing well.  He has been admitted after acute onset of right knee pain which caused him to fall.  There was concern for potential septic arthritis attempts were made to aspirate the joint in the ER and plain films failed to show an effusion.  MRI of the left foot does show his known chronic osteomyelitis.  Schedule MRI of the knee today.  #1 Right knee pain:  Open is dramatic improvement overnight and the fact that I took him off antibiotics in the interim I am very skeptical that he has a septic knee at this point in time based on his exam and  the data now available  #2  osteomyelitis of the left foot:  Is a chronic issue and he himself is not experience any clinical worsening.  He is adamantly against any surgical intervention whatsoever and wishes to follow-up with [name] at [facility]  If the MRI knee is normal I will sign off and recommend followup with [name]  I have personally spent 50 minutes involved in face-to-face and non-face-to-face activities for this patient on the day of the visit. Professional time spent includes the following activities: Preparing to see the patient (review of tests), Obtaining and/or reviewing separately obtained history (admission/discharge record), Performing a medically appropriate examination and/or evaluation , Ordering medications/tests/procedures, referring and communicating with other health care professionals, Documenting clinical information in the EMR, Independently interpreting results (not separately reported), Communicating results to the patient/family/caregiver, Counseling and educating the patient/family/caregiver and Care coordination (not separately reported).   Evaluation of the patient requires complex antimicrobial therapy evaluation, counseling , isolation needs to reduce disease transmission and risk assessment and mitigation.   ADDENDUM:  I had thought that I had DC all of his antibiotics yesterday but he was on ancef  which I failled to DC  The MRI knee is normal and his rapid response to rx for gout means this is what caused his right knee pain.   HIs left foot is with chronic osteomyelitis. Even though the RN in ER noticed more odor he himself had not noticed much change here and I do not think there is acute infection of the foot. As mentioned he is ADAMANTLY against any surgeries to foot esp amputations and wants only to follow with [name]. Would DC off of antibiotics with follow-up with her.   LOS: 1 day   Jomarie Fleeta Rothman 09/28/2024, 11:48 AM

## 2024-09-28 NOTE — Progress Notes (Signed)
 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 and [place] Continued cocaine habituation (snorts) He has never been diagnosed with gout Complicated right and left foot history as summarized below  10/24 several falls --- initially had 4-day history of right knee pain and then sustained a fall did not hit head-hypotensive injury--on admit AKI BUN/creatinine 26/2.7 bicarb normal  WBC 15.5 hemoglobin 10.6 platelet 320  Chronology  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 by [name]   04/18/24 post op wound dehiscence - underwent debridement MRSA bacteremia  04/23/24 - underwent Rt TMA Treated with 2+ weeks Of Iv dapto/mero- was doing cocaine as OP and was transiently  in CT and missed apptsand then got PICC removed in [place]   06/05/24 dehiscence of wound and underwent delayed primary closure 06/29/24 revision TMA Sent to SNF on 07/14/24 with 6 weeks of Iv dapto and ertapenem  until 9/3    Left foot Chronic left foot wounds since 05/2022 with extensive debridement multiple courses of Iv antibiotics- BKA recommended which he refused- was on a prolonged course of PO antibiotics for osteo--wounds are better with no infections  and not an issue now Recent graft on 06/27/24   Pertinent imaging/studies till date  Knee  x-ray right side no acute osseous abnormality Foot x-ray left side chronic deformity 2nd through 5th metatarsals sclerosis of 2nd, 3rd and 5th metatarsal suggestive chronic osteomyelitis lucency over second third MTP joint of metatarsals and bases of proximal phalanges likely secondary to superimposed soft tissue ulcer cannot exclude erosive osteomyelitis MRI left foot pending   Assessment  & Plan  :    Significant right knee pain concerning for septic arthritis MRI right knee inconclusive for osteomyelitis All antibiotics discontinued 10/26 dry [place] tap was performed on 10/24  Pain control with oxycodone  1-2 tabs every 4 as needed, hydromorphone  0.5 Q3 as needed severe pain Likely osteomyelitis acute of left foot MRI left foot shows chronic osteomyelitis Patient historically has been reticent to consider any amputation-no antibiotics currently follow-up with Dr. Outpatient unclear AKI on admission Kidney function about the same and likely secondary to ATN-narrowing of vancomycin  has been stable soon Kidney function improving-discontinue Diabetes mellitus with neuropathy Continue 15 units twice daily of NPH insulin  sliding scale moderate for CHF coverage as well CBG 170-210 eating 100% of meals Continue gabapentin  300 twice daily Cocaine habituation Needs to quit  Data Reviewed today:   Sodium 132 potassium 3.7  BUN/creatinine 26/2.1 WBC 10.6 hemoglobin 8.5 platelet 272  DVT prophylaxis: SCD  Status is: Observation The patient will require care spanning > 2 midnights and should be moved to inpatient because:  Will require inpatient management Dispo/Global plan: Unclear at this time   Time 50   Subjective:   Severe knee pain-this was a main presenting complaint from home No benefit with colchicine No nausea no vomiting    Objective + exam Vitals:   09/27/24 1652 09/27/24 2024 09/28/24 0509 09/28/24 0734  BP: 121/85 122/75 131/77 (!) 137/116  Pulse: 90 90 88 97  Resp: 19 18 18 19   Temp: 98.3 F (36.8 C) 98.1 F (36.7 C)  98.2 F (36.8 C)  TempSrc:  Oral  SpO2: 96% 99% 94% 97%  Weight:      Height:       Filed Weights   09/26/24 2058  Weight: 107.2 kg     Examination: EOMI NCAT coherent awake alert pleasant black male No distress ROM intact no focal deficit Right knee is less tender he has better ROM passive and actively LLE  pix   Scheduled Meds:  Chlorhexidine  Gluconate Cloth  6 each Topical Daily   gabapentin   300 mg Oral BID   insulin  aspart  0-15 Units Subcutaneous TID WC   insulin  aspart  0-5 Units Subcutaneous QHS   insulin  NPH Human  15 Units Subcutaneous BID AC & HS   mupirocin  ointment  1 Application Nasal BID   sodium chloride  flush  3 mL Intravenous Q12H   Continuous Infusions:   HYDROmorphone  (DILAUDID ) injection, melatonin, oxyCODONE -acetaminophen , polyethylene glycol  Jai-Gurmukh Renel Ende, MD  [facility]

## 2024-09-29 ENCOUNTER — Other Ambulatory Visit (HOSPITAL_COMMUNITY): Payer: Self-pay

## 2024-09-29 DIAGNOSIS — M25561 Pain in right knee: Secondary | ICD-10-CM | POA: Diagnosis not present

## 2024-09-29 LAB — CBC WITH DIFFERENTIAL/PLATELET
Abs Immature Granulocytes: 0.27 K/uL — ABNORMAL HIGH (ref 0.00–0.07)
Basophils Absolute: 0 K/uL (ref 0.0–0.1)
Basophils Relative: 0 %
Eosinophils Absolute: 0.1 K/uL (ref 0.0–0.5)
Eosinophils Relative: 1 %
HCT: 29.8 % — ABNORMAL LOW (ref 39.0–52.0)
Hemoglobin: 9.2 g/dL — ABNORMAL LOW (ref 13.0–17.0)
Immature Granulocytes: 2 %
Lymphocytes Relative: 12 %
Lymphs Abs: 1.4 K/uL (ref 0.7–4.0)
MCH: 24.7 pg — ABNORMAL LOW (ref 26.0–34.0)
MCHC: 30.9 g/dL (ref 30.0–36.0)
MCV: 79.9 fL — ABNORMAL LOW (ref 80.0–100.0)
Monocytes Absolute: 1.3 K/uL — ABNORMAL HIGH (ref 0.1–1.0)
Monocytes Relative: 11 %
Neutro Abs: 8.5 K/uL — ABNORMAL HIGH (ref 1.7–7.7)
Neutrophils Relative %: 74 %
Platelets: 336 K/uL (ref 150–400)
RBC: 3.73 MIL/uL — ABNORMAL LOW (ref 4.22–5.81)
RDW: 16.6 % — ABNORMAL HIGH (ref 11.5–15.5)
WBC: 11.6 K/uL — ABNORMAL HIGH (ref 4.0–10.5)
nRBC: 0 % (ref 0.0–0.2)

## 2024-09-29 LAB — BASIC METABOLIC PANEL WITH GFR
Anion gap: 9 (ref 5–15)
BUN: 21 mg/dL — ABNORMAL HIGH (ref 6–20)
CO2: 24 mmol/L (ref 22–32)
Calcium: 8.4 mg/dL — ABNORMAL LOW (ref 8.9–10.3)
Chloride: 102 mmol/L (ref 98–111)
Creatinine, Ser: 1.72 mg/dL — ABNORMAL HIGH (ref 0.61–1.24)
GFR, Estimated: 46 mL/min — ABNORMAL LOW (ref >60)
Glucose, Bld: 132 mg/dL — ABNORMAL HIGH (ref 70–99)
Potassium: 4.2 mmol/L (ref 3.5–5.1)
Sodium: 135 mmol/L (ref 135–145)

## 2024-09-29 LAB — GLUCOSE, CAPILLARY
Glucose-Capillary: 125 mg/dL — ABNORMAL HIGH (ref 70–99)
Glucose-Capillary: 133 mg/dL — ABNORMAL HIGH (ref 70–99)

## 2024-09-29 MED ORDER — LOSARTAN POTASSIUM 25 MG PO TABS: 12.5000 mg | ORAL_TABLET | Freq: Every day | ORAL | Status: DC

## 2024-09-29 MED ORDER — OXYCODONE-ACETAMINOPHEN 5-325 MG PO TABS
1.0000 | ORAL_TABLET | ORAL | 0 refills | Status: AC | PRN
  Filled 2024-09-29: qty 30, 3d supply, fill #0

## 2024-09-29 NOTE — TOC Initial Note (Signed)
 Transition of Care [facility]) - Initial/Assessment Note    Patient Details  Name: [name] MRN: [id number] Date of Birth: 1966-09-27  Transition of Care [facility]) CM/SW Contact:    [name], LCSWA Phone Number: 09/29/2024, 10:24 AM  Clinical Narrative: Pt is from home alone and is independent with ADL's. Pt uses a cane as needed and does not have a HCPOA. CSW asked if they would be interested in receiving info and pt replied maybe later.  Pt reports that they take their meds regularly. Pt is able to drive sometimes and will receive transportation from family members when needed. Pt is able to afford all basic needs and has no SDOH concerns.  No current TOC needs. Please place consult for any further needs.                  Expected Discharge Plan: Home/Self Care Barriers to Discharge: Continued Medical Work up   Patient Goals and CMS Choice Patient states their goals for this hospitalization and ongoing recovery are:: To go to the gym and workout upper body   Choice offered to / list presented to : NA      Expected Discharge Plan and Services In-house Referral: Clinical Social Work     Living arrangements for the past 2 months: Single Family Home                                      Prior Living Arrangements/Services Living arrangements for the past 2 months: Single Family Home Lives with:: Self Patient language and need for interpreter reviewed:: Yes Do you feel safe going back to the place where you live?: Yes      Need for Family Participation in Patient Care: No (Comment) Care giver support system in place?: No (comment) Current home services: DME Criminal Activity/Legal Involvement Pertinent to Current Situation/Hospitalization: No - Comment as needed  Activities of Daily Living   ADL Screening (condition at time of admission) Independently performs ADLs?: No Does the patient have a NEW difficulty with bathing/dressing/toileting/self-feeding that is  expected to last >3 days?: No Does the patient have a NEW difficulty with getting in/out of bed, walking, or climbing stairs that is expected to last >3 days?: Yes (Initiates electronic notice to provider for possible PT consult) Does the patient have a NEW difficulty with communication that is expected to last >3 days?: No Is the patient deaf or have difficulty hearing?: No Does the patient have difficulty seeing, even when wearing glasses/contacts?: No Does the patient have difficulty concentrating, remembering, or making decisions?: No  Permission Sought/Granted Permission sought to share information with : Family Supports Permission granted to share information with : Yes, Verbal Permission Granted  Share Information with NAME: [name]     Permission granted to share info w Relationship: Brother  Permission granted to share info w Contact Information: [phone]  Emotional Assessment Appearance:: Appears stated age Attitude/Demeanor/Rapport: Engaged Affect (typically observed): Appropriate, Pleasant Orientation: : Oriented to Self, Oriented to Place, Oriented to Situation, Oriented to  Time Alcohol / Substance Use: Not Applicable Psych Involvement: No (comment)  Admission diagnosis:  AKI (acute kidney injury) [N17.9] Acute gout of right knee, unspecified cause [M10.9] Osteomyelitis of left foot, unspecified type (HCC) [M86.9] Acute renal failure superimposed on stage 3a chronic kidney disease, unspecified acute renal failure type (HCC) [N17.9, N18.31] Patient Active Problem List   Diagnosis Date Noted  Right knee pain 09/27/2024   Acute renal failure superimposed on stage 3a chronic kidney disease, unspecified acute renal failure type (HCC) 09/26/2024   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/02/2024   Wound infection 07/01/2024   Uncontrolled type 2 diabetes mellitus with hyperglycemia, with long-term current use of insulin  (HCC) 06/27/2024   Acute osteomyelitis of right foot (HCC) 06/26/2024    MRSA bacteremia 04/21/2024   HLD (hyperlipidemia) 04/17/2024   Chronic kidney disease, stage 3a (HCC) 04/17/2024   Normocytic anemia 04/17/2024   Amputation of right great toe 03/31/2024   Necrotizing soft tissue infection 03/26/2024   Hypertension associated with diabetes (HCC) 12/12/2022   Toe osteomyelitis, left (HCC) 12/12/2022   Pyogenic inflammation of bone (HCC) 06/02/2022   PICC (peripherally inserted central catheter) in place 06/02/2022   Medication management 06/02/2022   Diabetic foot infection (HCC) 06/02/2022   Smoking 06/02/2022   Tobacco abuse 05/06/2022   Hyperkalemia 05/06/2022   Obesity (BMI 30-39.9) 05/05/2022   Cellulitis and abscess of foot    Injury of left foot    Diabetic foot ulcer (HCC) 12/14/2021   PCP:  Matthew Clam, MD [name]:   [name] [address] [name] [address] Phone: [phone] Fax: [phone]  [name] Phone: [phone] Fax: [phone]  [name] [address] [name] [address] Phone: [phone] Fax: [phone]     Social Drivers of Health (SDOH) Social History: SDOH Screenings   Food Insecurity: No Food Insecurity (09/26/2024)  Housing: Low Risk  (09/26/2024)  Transportation Needs: No Transportation Needs (09/26/2024)  Utilities: Not At Risk (09/26/2024)  Depression (PHQ2-9): Low Risk  (08/28/2023)  Financial Resource Strain: Patient Declined (06/28/2023)   Received from [facility]  Social Connections: Moderately Integrated (06/26/2024)  Tobacco Use: Medium Risk (09/26/2024)  Health Literacy: Adequate Health Literacy (05/01/2024)   SDOH Interventions:     Readmission Risk Interventions     No data to display

## 2024-09-29 NOTE — TOC Transition Note (Signed)
 Transition of Care [facility]) - Discharge Note   Patient Details  Name: [name] MRN: [id number] Date of Birth: 1965/12/23  [facility]) CM/SW Contact:  Lendia Dais, LCSWA Phone Number: 09/29/2024, 12:42 PM   Clinical Narrative:  Pt will discharge home w/ self care and will be transported by brother Koren.  Koren was at bedside and informed of the pt's discharge.  No further TOC needs.    Final next level of care: Home/Self Care Barriers to Discharge: Continued Medical Work up   Patient Goals and CMS Choice Patient states their goals for this hospitalization and ongoing recovery are:: To go to the gym and workout upper body   Choice offered to / list presented to : NA      Discharge Placement                Patient to be transferred to facility by: Family Name of family member notified: Koren (brother) Patient and family notified of of transfer: 09/29/24  Discharge Plan and Services Additional resources added to the After Visit Summary for   In-house Referral: Clinical Social Work                                   Social Drivers of Health (SDOH) Interventions SDOH Screenings   Food Insecurity: No Food Insecurity (09/26/2024)  Housing: Low Risk  (09/26/2024)  Transportation Needs: No Transportation Needs (09/26/2024)  Utilities: Not At Risk (09/26/2024)  Depression (PHQ2-9): Low Risk  (08/28/2023)  Financial Resource Strain: Patient Declined (06/28/2023)   Received from [facility]  Social Connections: Moderately Integrated (06/26/2024)  Tobacco Use: Medium Risk (09/26/2024)  Health Literacy: Adequate Health Literacy (05/01/2024)     Readmission Risk Interventions     No data to display

## 2024-09-29 NOTE — Discharge Summary (Addendum)
 Physician Discharge Summary  [name] FMW:[id number] DOB: August 01, 1966 DOA: 09/26/2024  PCP: Matthew Clam, MD  Admit date: 09/26/2024 Discharge date: 09/29/2024  Time spent: 24 minutes  Recommendations for Outpatient Follow-up:  Needs outpatient coordination of wound care with [name] in [place] her Recommend A1c Chem-12 CBC in 1 week Very limited amount of pain meds prescribed at discharge for right knee pain  Discharge Diagnoses:  MAIN problem for hospitalization   Cellulitis superimposed on chronic left foot osteomyelitis Concern for septic arthritis right knee-concern unfounded at discharge  Please see below for itemized issues addressed in HOpsital- refer to other progress notes for clarity if needed  Discharge Condition: Guarded  Diet recommendation: Diabetic  Filed Weights   09/26/24 2058  Weight: 107.2 kg    History of present illness:  58 black male 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--- bilateral feet infections [name] and [name] Continued cocaine habituation (snorts) He has never been diagnosed with gout Complicated right and left foot history as summarized below   10/24 several falls --- initially had 4-day history of right knee pain and then sustained a fall did not hit head-hypotensive injury--on admit AKI BUN/creatinine 26/2.7 bicarb normal  WBC 15.5 hemoglobin 10.6 platelet 320   Chronology  [name] note Complicated history  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 by [name]   04/18/24 post op wound dehiscence - underwent debridement MRSA bacteremia  04/23/24 - underwent Rt TMA Treated with 2+ weeks Of Iv dapto/mero- was doing cocaine as OP and was transiently  in CT and missed apptsand then got PICC removed in [place]   06/05/24 dehiscence of wound and underwent delayed primary closure 06/29/24 revision TMA Sent to SNF on 07/14/24 with 6 weeks of Iv dapto and ertapenem  until 9/3     Left  foot Chronic left foot wounds since 05/2022 with extensive debridement multiple courses of Iv antibiotics- BKA recommended which he refused- was on a prolonged course of PO antibiotics for osteo--wounds are better with no infections  and not an issue now Recent graft on 06/27/24    Pertinent imaging/studies till date  Knee  x-ray right side no acute osseous abnormality Foot x-ray left side chronic deformity 2nd through 5th metatarsals sclerosis of 2nd, 3rd and 5th metatarsal suggestive chronic osteomyelitis lucency over second third MTP joint of metatarsals and bases of proximal phalanges likely secondary to superimposed soft tissue ulcer cannot exclude erosive osteomyelitis 10/25 MRI left foot showed enlarging soft tissue ulcer surgical wound along plantar aspect second metatarsal head extending to the fused second MTP joint progressive soft tissue inflammation around second MTP joint and second proximal phalanx complex fluid collection plantar to second proximal interphalangeal joint suspicious for small abscess--progressive cortical discussion along the fused second metatarsophalangeal joint consistent with progressive osteo--chronic osteo third metatarsal head base of third proximal phalanx distal fifth metatarsal similar to prior 10/26 MRI right knee nonspecific moderate-sized joint effusion with synovial enhancement?  Septic arthritis versus not consider joint aspiration nonspecific soft tissue edema surrounding the knee anterolaterally?  Cellulitis all ligaments intact    Assessment  & Plan :      Significant right knee pain concerning for septic arthritis MRI right knee inconclusive only suggestive for osteomyelitis--All antibiotics discontinued 10/26 with no deleterious effect and no further systemic signs or symptoms-ID has seen the patient and discontinued antibiotics as above and feels that the patient can follow-up with [name] who has been managing him exclusively dry [place] tap  was performed  on 10/24  Pain control with oxycodone  1-2 tabs every 4 as needed Limited prescription given at discharge and patient has been told to follow-up with primary care (he should get a wellness clinic follow-up or find a new PCP) within the next 1 to 2 weeks for labs etc. Cellulitis superimposed on chronic osteomyelitis left foot with chronic wounds no sepsis on admission Had an infected looking left leg per pictures-this is improved with cleansing MRI left foot shows chronic osteomyelitis Patient historically has been reticent to consider surgical management--- follow-up with [name] as an outpatient Vashe dressings as per orders AKI on admission Kidney function about the same and likely secondary to ATN from infected looking left leg I have cut back his losartan  to 12.5 he needs labs in about a week Diabetes mellitus with neuropathy Home meds resumed at discharge Metformin  relatively contraindicated with a creatinine above 1.5-this discussion needs to be reinforced in the outpatient setting with change in therapies once he sees PCP Continue gabapentin  300 twice daily Cocaine habituation Needs to quit-precontemplative and unwilling  Discharge Exam: Vitals:   09/29/24 0605 09/29/24 0735  BP: 124/78 (!) 140/66  Pulse: 83 90  Resp: 19 18  Temp: 98.9 F (37.2 C) 99.5 F (37.5 C)  SpO2: 99% 100%    Subj on day of d/c   Looks well feels fair some discomfort in knee but no areas warm he has been up and around No chills no rigor  General Exam on discharge  EOMI NCAT no focal deficit CTAB no added sound S1-S2 no murmur sinus rhythm Power 5/5  Discharge Instructions   Discharge Instructions     Diet - low sodium heart healthy   Complete by: As directed    Discharge instructions   Complete by: As directed    We will prescribe you a limited prescription of medication for pain control It would be a good idea to get a primary care physician and the social worker  will talk to you about options to make sure that you have someone who can follow your wounds etc. please follow-up in the outpatient setting with Dr. Fredia [name] of infectious disease in [place] and make sure that your wound remains clean does not you do not wet it and that you give it adequate wound care Wound care nurse to go over things with you before you go home to make sure you know how to keep the wounds clean Very limited prescription of antibiotics will be prescribed going forward   Make sure that you look at your meds carefully-you were little dehydrated this hospital stay so it would be a good idea to cut back your Cozaar  for the time being until you can follow-up with primary care  Metformin  may not be a good long-term option for you of your kidneys in the near future continue to be dehydrated-you may need to use another oral agent such as Amaryl or glimepiride-make sure you follow-up with PCP to discuss these options   Discharge wound care:   Complete by: As directed    L foot: Cleanse the skin with Vashe #[id number], not rinse, pat dry. Apply a moist gauze with Vashe into the wound bed, top with Foam dressing, wrap with Kerlix. Change daily.   Increase activity slowly   Complete by: As directed       Allergies as of 09/29/2024   No Known Allergies      Medication List     STOP taking these medications  nitroGLYCERIN  0.2 mg/hr patch Commonly known as: NITRODUR - Dosed in mg/24 hr       TAKE these medications    Accu-Chek Guide Test test strip Generic drug: glucose blood Use to check blood sugar 3 times daily.   Accu-Chek Guide w/Device Kit Use to check blood sugar 3 times daily.   Accu-Chek Softclix Lancets lancets Use to check blood sugar 3 times daily.   bisacodyl  5 MG EC tablet Generic drug: bisacodyl  Take 2 tablets (10 mg total) by mouth at bedtime as needed for moderate constipation.   chlorhexidine  4 % external liquid Commonly known as:  HIBICLENS  Apply 15 mLs (1 Application total) topically as directed for 30 doses. Use as directed daily for 5 days every other week for 6 weeks.   Dexcom G6 Transmitter Misc Use to check blood sugar three times daily. Change transmitter once every 909 days. E11.69   Ferrex 150 150 MG capsule Generic drug: iron  polysaccharides Take 1 capsule (150 mg total) by mouth daily.   gabapentin  300 MG capsule Commonly known as: NEURONTIN  Take 1 capsule (300 mg total) by mouth 2 (two) times daily for neuropathy pain. What changed: Another medication with the same name was removed. Continue taking this medication, and follow the directions you see here.   HumuLIN  N KwikPen 100 UNIT/ML KwikPen Generic drug: Insulin  NPH (Human) (Isophane) Inject 22 Units into the skin 2 (two) times daily.   losartan  25 MG tablet Commonly known as: COZAAR  Take 0.5 tablets (12.5 mg total) by mouth daily. What changed: how much to take   metFORMIN  500 MG 24 hr tablet Commonly known as: GLUCOPHAGE -XR Take 2 tablets (1,000 mg total) by mouth 2 (two) times daily with a meal.   NovoLOG  FlexPen 100 UNIT/ML FlexPen Generic drug: insulin  aspart Inject 0-15 Units into the skin 3 (three) times daily before meals.  Correction coverage: Moderate (average weight, post-op) CBG 70 - 120: 0 units CBG 121 - 150: 2 units CBG 151 - 200: 3 units CBG 201 - 250: 5 units CBG 251 - 300: 8 units CBG 301 - 350: 11 units CBG 351 - 400: 15 units CBG > 400: call MD   oxyCODONE -acetaminophen  5-325 MG tablet Commonly known as: PERCOCET/ROXICET Take 1-2 tablets by mouth every 4 (four) hours as needed for up to 5 days for moderate pain (pain score 4-6) or severe pain (pain score 7-10). What changed:  how much to take reasons to take this   polyethylene glycol powder 17 GM/SCOOP powder Commonly known as: GLYCOLAX /MIRALAX  Take 17 g by mouth 2 (two) times daily. Mix as directed. What changed:  when to take this reasons to take this    TechLite Pen Needles 32G X 4 MM Misc Generic drug: Insulin  Pen Needle Use as directed to inject insulin  up to 4 times daily.   Vitamin D3 1.25 MG (50000 UT) Caps Take 1 capsule (50,000 Units total) by mouth every Monday.               Discharge Care Instructions  (From admission, onward)           Start     Ordered   09/29/24 0000  Discharge wound care:       Comments: L foot: Cleanse the skin with Vashe #[id number], not rinse, pat dry. Apply a moist gauze with Vashe into the wound bed, top with Foam dressing, wrap with Kerlix. Change daily.   09/29/24 1050           No  Known Allergies    The results of significant diagnostics from this hospitalization (including imaging, microbiology, ancillary and laboratory) are listed below for reference.    Significant Diagnostic Studies: MR KNEE RIGHT W WO CONTRAST Result Date: 09/28/2024 CLINICAL DATA:  Septic arthritis suspected, knee, xray done Reported right knee pain, swelling and erythema for 4 days on radiographs performed 2 days ago. EXAM: MRI OF THE RIGHT KNEE WITHOUT AND WITH CONTRAST TECHNIQUE: Multiplanar, multisequence MR imaging of the right knee was performed both before and after administration of intravenous contrast. CONTRAST:  10mL GADAVIST  GADOBUTROL  1 MMOL/ML IV SOLN COMPARISON:  Radiographs 09/26/2024 FINDINGS: Technical note: Despite efforts by the technologist and patient, mild motion artifact is present on today's exam and could not be eliminated. This reduces exam sensitivity and specificity. MENISCI Medial meniscus:  Intact with normal morphology. Lateral meniscus:  Intact with normal morphology. LIGAMENTS Cruciates: The anterior and posterior cruciate ligaments are intact. Collaterals: The medial and lateral collateral ligament complexes are intact. CARTILAGE Patellofemoral: Small nearly full-thickness chondral defect superiorly in the medial patellar facet. Mild chondral thinning and surface irregularity of  the trochlear cartilage. Medial:  Mild chondral thinning and surface irregularity. Lateral:  Preserved. MISCELLANEOUS Joint: Nonspecific moderate-sized knee joint effusion with synovial enhancement following contrast. Popliteal Fossa: The popliteus muscle and tendon are intact. Small Baker's cyst. Mild edema in the popliteal fossa without other focal fluid collection. Extensor Mechanism: The visualized quadriceps and patellar tendons are intact. Bones: No evidence of acute fracture or dislocation. No erosive changes or subchondral edema to suggest osteomyelitis. Other: Nonspecific soft tissue edema surrounding the knee, greatest anterolaterally. Probable associated subcutaneous enhancement which may indicate cellulitis. No focal fluid collection or foreign body identified. IMPRESSION: 1. Nonspecific moderate-sized knee joint effusion with synovial enhancement following contrast. Septic arthritis not excluded. Consider joint aspiration. 2. Nonspecific soft tissue edema surrounding the knee, greatest anterolaterally. Probable associated subcutaneous enhancement which may indicate cellulitis. No focal fluid collection or foreign body identified. 3. No evidence of osteomyelitis. 4. The menisci, cruciate and collateral ligaments are intact. 5. Mild medial and patellofemoral compartment degenerative chondrosis. Electronically Signed   By: Elsie Perone M.D.   On: 09/28/2024 12:04   MR FOOT LEFT W WO CONTRAST Result Date: 09/28/2024 CLINICAL DATA:  Osteonecrosis suspected, foot, xray done History of left foot irrigation and debridement procedure 06/29/2024. EXAM: MRI OF THE LEFT FOREFOOT WITHOUT AND WITH CONTRAST TECHNIQUE: Multiplanar, multisequence MR imaging of the left forefoot was performed both before and after administration of intravenous contrast. CONTRAST:  10mL GADAVIST  GADOBUTROL  1 MMOL/ML IV SOLN COMPARISON:  Radiographs 09/26/2024 and 06/29/2024. MRI 06/21/2024 and CT 03/25/2024. FINDINGS: Technical  note: Despite efforts by the technologist and patient, mild motion artifact is present on today's exam and could not be eliminated. This reduces exam sensitivity and specificity. Bones/Joint/Cartilage Progressive soft tissue ulcer or surgical wound involving the plantar aspect of the forefoot at the level of the 2nd metatarsal. Soft tissue findings are further described below. Underlying chronic ankylosis of the 2nd metatarsophalangeal joint with chronic deformities of the distal 2nd through 5th metatarsals. Compared with the previous MRI, there is progressive cortical destruction along the fused 2nd metatarsophalangeal joint with increased T2 hyperintensity, decreased T1 hyperintensity and heterogeneous bone marrow enhancement, consistent with progressive osteomyelitis. Signal changes within the 3rd metatarsal head and base of the 3rd proximal phalanx are similar to previous MRI, likely indicative of chronic osteomyelitis. Likewise, chronic deformity of the distal 5th metatarsal with associated chronic marrow changes, also likely related  to chronic osteomyelitis. No other progressive bone destruction identified. Ligaments Intact Lisfranc ligament. The collateral ligaments of the 2nd, 3rd and 5th metatarsophalangeal joints are not well visualized. Muscles and Tendons Mild generalized muscular edema and atrophy. No significant tenosynovitis identified. Soft tissues As above, enlarging soft tissue ulcer or surgical wound along the plantar aspect of the 2nd metatarsal head extending to the fused 2nd metatarsophalangeal joint. There are progressive soft tissue inflammatory changes surrounding the 2nd metatarsophalangeal joint and 2nd proximal phalanx with a complex fluid collection plantar to the 2nd proximal interphalangeal joint. This measures up to 1.4 cm in diameter and is associated with air bubbles and peripheral and enhancement, suspicious for a small abscess. Additional chronic soft tissue ulcer along the  plantar aspect of the distal 5th metatarsal. Chronic dermal and subcutaneous thickening in the medial plantar aspect of the forefoot. No other focal fluid collections are identified. IMPRESSION: 1. Enlarging soft tissue ulcer or surgical wound along the plantar aspect of the 2nd metatarsal head extending to the fused 2nd metatarsophalangeal joint. There are progressive soft tissue inflammatory changes surrounding the 2nd metatarsophalangeal joint and 2nd proximal phalanx with a small complex fluid collection plantar to the 2nd proximal interphalangeal joint, suspicious for a small abscess. 2. Progressive cortical destruction along the fused 2nd metatarsophalangeal joint consistent with progressive osteomyelitis. 3. Chronic osteomyelitis of the 3rd metatarsal head, base of the 3rd proximal phalanx and distal 5th metatarsal, similar to previous MRI. 4. Additional chronic soft tissue ulcer along the plantar aspect of the distal 5th metatarsal. Electronically Signed   By: Elsie Perone M.D.   On: 09/28/2024 11:04   DG Foot Complete Left Result Date: 09/26/2024 CLINICAL DATA:  Fall EXAM: LEFT FOOT - COMPLETE 3+ VIEW COMPARISON:  06/29/2024, 03/25/2024 FINDINGS: Large ulcer/wound plantar aspect of the mid to distal foot. No fracture is seen. Chronic deformity of the fifth metatarsal. Chronic deformities of the second third and fourth metatarsal heads. Interval increased periosteal reaction at the head of the first metatarsal and base of first proximal phalanx on the lateral side. There appears to be ankylosis across the second and third MTP joints. Lucency at the level of the second and third MTP joint/metatarsal head/bases of the second and third proximal phalanges on the oblique view, some of which may be due to soft tissue ulcer. Sclerosis of the second third and fifth metatarsals which may be due to chronic osteomyelitis. Possible increased periosteal reaction along the shaft of the fifth metatarsal on lateral  view. IMPRESSION: 1. Large ulcer/wound plantar aspect of the mid to distal foot. No acute fracture is seen. 2. Interval increased periosteal reaction at the head of the first metatarsal and base of first proximal phalanx on the lateral side, possible osteomyelitis. 3. Chronic deformities of the second through fifth metatarsals. Sclerosis of the second third and fifth metatarsal suggesting chronic osteomyelitis. Lucency overlying the second and third MTP joints/heads of metatarsals/bases of proximal phalanges probably in part due to superimposed soft tissue ulcer but cannot exclude erosive change related to osteomyelitis. Consider MRI follow-up. Electronically Signed   By: Luke Bun M.D.   On: 09/26/2024 15:52   DG Knee Complete 4 Views Right Result Date: 09/26/2024 EXAM: 4 OR MORE VIEW(S) XRAY OF THE R KNEE 09/26/2024 02:49:00 PM COMPARISON: None available. CLINICAL HISTORY: R knee pain and swelling, erythema. Per triage notes: Patient has R knee pain without injury x 4 days, causing him to fall twice yesterday. Did not hit head or pass out and denies  other injury from falls. FINDINGS: BONES AND JOINTS: No acute fracture. No focal osseous lesion. No joint dislocation. No significant joint effusion. No significant degenerative changes. SOFT TISSUES: The soft tissues are unremarkable. IMPRESSION: 1. No acute osseous abnormality of the knee. Electronically signed by: Selinda Blue MD 09/26/2024 03:21 PM EDT RP Workstation: [id number]    Microbiology: Recent Results (from the past 240 hours)  Culture, blood (routine x 2)     Status: None (Preliminary result)   Collection Time: 09/26/24  3:08 PM   Specimen: BLOOD  Result Value Ref Range Status   Specimen Description BLOOD RIGHT ANTECUBITAL  Final   Special Requests   Final    BOTTLES DRAWN AEROBIC AND ANAEROBIC Blood Culture adequate volume   Culture   Final    NO GROWTH 3 DAYS Performed at [facility], [address], [place], [place]  [address]    Report Status PENDING  Incomplete  Culture, blood (routine x 2)     Status: None (Preliminary result)   Collection Time: 09/26/24  5:22 PM   Specimen: BLOOD  Result Value Ref Range Status   Specimen Description BLOOD BLOOD LEFT ARM  Final   Special Requests   Final    AERB Blood Culture results may not be optimal due to an inadequate volume of blood received in culture bottles   Culture   Final    NO GROWTH 3 DAYS Performed at [facility], [address], [place], [place] [address]    Report Status PENDING  Incomplete  MRSA Next Gen by PCR, Nasal     Status: Abnormal   Collection Time: 09/26/24 11:51 PM   Specimen: Nasal Mucosa; Nasal Swab  Result Value Ref Range Status   MRSA by PCR Next Gen DETECTED (A) NOT DETECTED Final    Comment: RESULTS CALLED TO, READ BACK BY AND VERIFIED WITH RN T.ROSS ON 09/27/24 AT 0140 BY NM (NOTE) The GeneXpert MRSA Assay (FDA approved for NASAL specimens only), is one component of a comprehensive MRSA colonization surveillance program. It is not intended to diagnose MRSA infection nor to guide or monitor treatment for MRSA infections. Test performance is not FDA approved in patients less than 7 years old. Performed at [facility], [address] [place], [place] [address]      Labs: Basic Metabolic Panel: Recent Labs  Lab 09/26/24 1402 09/27/24 0432 09/28/24 0417 09/29/24 0504  NA 131* 132* 132* 135  K 3.7 3.6 3.7 4.2  CL 98 99 99 102  CO2 22 22 23 24   GLUCOSE 281* 170* 192* 132*  BUN 26* 27* 26* 21*  CREATININE 2.73* 2.73* 2.15* 1.72*  CALCIUM  8.3* 7.9* 8.1* 8.4*   Liver Function Tests: Recent Labs  Lab 09/26/24 1548 09/27/24 0432  AST 20 11*  ALT 11 10  ALKPHOS 65 52  BILITOT 1.1 0.3  PROT 7.4 6.6  ALBUMIN 2.6* 2.3*   No results for input(s): LIPASE, AMYLASE in the last 168 hours. No results for input(s): AMMONIA in the last 168 hours. CBC: Recent Labs  Lab 09/26/24 1402 09/27/24 0432  09/28/24 0417 09/29/24 0504  WBC 15.5* 14.2* 10.6* 11.6*  NEUTROABS 12.5*  --  8.3* 8.5*  HGB 10.6* 9.2* 8.5* 9.2*  HCT 34.3* 29.6* 27.5* 29.8*  MCV 79.6* 80.0 79.7* 79.9*  PLT 320 277 272 336   Cardiac Enzymes: No results for input(s): CKTOTAL, CKMB, CKMBINDEX, TROPONINI in the last 168 hours. BNP: BNP (last 3 results) No  results for input(s): BNP in the last 8760 hours.  ProBNP (last 3 results) No results for input(s): PROBNP in the last 8760 hours.  CBG: Recent Labs  Lab 09/28/24 0736 09/28/24 1151 09/28/24 1634 09/28/24 2031 09/29/24 0732  GLUCAP 171* 212* 140* 223* 125*    Signed:  Colen Grimes MD   [facility] 09/29/2024, 10:50 AM

## 2024-09-30 ENCOUNTER — Telehealth: Payer: Self-pay | Admitting: *Deleted

## 2024-09-30 NOTE — Transitions of Care (Post Inpatient/ED Visit) (Signed)
   09/30/2024  Name: Cederic Mozley MRN: [id number] DOB: 12/13/1965  Today's TOC FU Call Status: Today's TOC FU Call Status:: Unsuccessful Call (1st Attempt) Unsuccessful Call (1st Attempt) Date: 09/30/24  Attempted to reach the patient regarding the most recent Inpatient/ED visit.  Follow Up Plan: Additional outreach attempts will be made to reach the patient to complete the Transitions of Care (Post Inpatient/ED visit) call.   Andrea Dimes RN, BSN [place]  [facility] RN Care Manager [phone]

## 2024-10-01 ENCOUNTER — Telehealth: Payer: Self-pay | Admitting: *Deleted

## 2024-10-01 ENCOUNTER — Encounter: Payer: Self-pay | Admitting: Infectious Diseases

## 2024-10-01 ENCOUNTER — Ambulatory Visit (INDEPENDENT_AMBULATORY_CARE_PROVIDER_SITE_OTHER): Admitting: Infectious Diseases

## 2024-10-01 ENCOUNTER — Other Ambulatory Visit: Payer: Self-pay

## 2024-10-01 VITALS — BP 143/74 | HR 90 | Temp 94.9°F | Wt 243.0 lb

## 2024-10-01 DIAGNOSIS — M25561 Pain in right knee: Secondary | ICD-10-CM

## 2024-10-01 DIAGNOSIS — B353 Tinea pedis: Secondary | ICD-10-CM | POA: Diagnosis not present

## 2024-10-01 DIAGNOSIS — M009 Pyogenic arthritis, unspecified: Secondary | ICD-10-CM

## 2024-10-01 DIAGNOSIS — M869 Osteomyelitis, unspecified: Secondary | ICD-10-CM | POA: Diagnosis present

## 2024-10-01 LAB — CULTURE, BLOOD (ROUTINE X 2)
Culture: NO GROWTH
Culture: NO GROWTH
Special Requests: ADEQUATE

## 2024-10-01 MED ORDER — TOLNAFTATE 1 % EX SOLN: Freq: Two times a day (BID) | CUTANEOUS | Status: DC

## 2024-10-01 MED ORDER — DICLOFENAC SODIUM 1 % EX GEL: 2.0000 g | Freq: Two times a day (BID) | CUTANEOUS | Status: DC

## 2024-10-01 NOTE — Transitions of Care (Post Inpatient/ED Visit) (Signed)
   10/01/2024  Name: [name] MRN: [id number] DOB: Mar 18, 1966  Today's TOC FU Call Status: Today's TOC FU Call Status:: Unsuccessful Call (2nd Attempt) Unsuccessful Call (2nd Attempt) Date: 10/01/24  Attempted to reach the patient regarding the most recent Inpatient/ED visit.  Follow Up Plan: Additional outreach attempts will be made to reach the patient to complete the Transitions of Care (Post Inpatient/ED visit) call.   Andrea Dimes RN, BSN [place]  [facility] RN Care Manager [phone]

## 2024-10-01 NOTE — Progress Notes (Signed)
 NAME: [name]  DOB: [birth year]  MRN: [id number]  Date/Time: 10/01/2024 3:34 PM  Subjective:   58 yr male with h/o DM , neuropathy, b/l foot infection, MRSA bacteremia , RT TMA  Pt is here to follow up for rt knee acute pain  PT was recently in [facility] between 10/24-10/27 for rt knee pain of 4 days duration . MRI showed some fluid with synovial enhancement with contrast and Small nearly full-thickness chondral defect superiorly in the medial patellar facet. Septic arthritis was suspected but when IR tried to aspirate the joint they did not find any on the US  and hence did not attempt- UriC acid was 8.8. They stopped antibiotics after a day and patient wanted to be discharged and follow up with me as OP He has no fever or chills Unable to bear weight on the leg because of knee hurting   HE has  h/o chronic b/l foot  infection - last seen on 08/11/24   Was recently in the hospital 06/26/24-07/14/24 for infected Rt TMA site and had MRSA bacteremia, underwent Revision TMA Polymicrobial infection from wound culture.  It was ESBL Klebsiella, Staph aureus, [facility],- sent to SNF on Iv daptomycin  and IV ertapenem  and completed 6 weeks until 08/06/24.  PICC line was removed and when I saw him on 9/8 he was doing so much better and the TMA site had almost healed and hence no further antibiotic was prescribed He was followed by [name] podiatrist HE has chronic left foot ulcer since 2023 - this is stable- please see the note below      Complicated history  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 by [name]  04/18/24 post op wound dehiscence - underwent debridement MRSA bacteremia  04/23/24 - underwent Rt TMA Treated with 2+ weeks Of Iv dapto/mero- was doing cocaine as OP and was transiently  in CT and missed apptsand then got PICC removed in [place]  06/05/24 dehiscence of wound and underwent delayed primary closure 06/29/24 revision TMA Sent to SNF on 07/14/24 with 6 weeks of Iv dapto and  ertapenem  until 9/3    Left foot Chronic wounds left foot  --  Had bad infection and  osteomyelitis- debridement in June 2023- took 5 months of antibiotic Was told to get amputation, wanted to save his foot at all cost- so transferred care ot [facility] wound center- started hyperbaric oxygen - and got 9 and then stopped as he could not go there 5 times a week.  Completed cefadroxil , levaquin  and flagyl  X 4 months and prior to that nearly 8 weeks of IV Oral antibiotics stopped 10/31/22 The wound looks really good and nice granulation tissue- was getting apligraf at the wound clinic He then moved to [place] in February 2024  to take care of his mother The wound flared up to on his left foot and he was hospitalized at [facility] in July 2024  and was released on vancomycin  and ceftriaxone  and then  p.o. linezolid  and p.o. Augmentin . He completed antibiotics sept/oct 2024 and was off any antibiotics The left foot wounds are not an issue  But they have not closed completley stable-wounds are better with no infections  and not an issue now Recent ACELL graft on 06/27/24 and wound stable  Following are pictures over 2 years June 1 , 2023   June 8.2023             07/04/24   09/27/24      Past Medical History:  Diagnosis Date   Amputation of right great toe    Cellulitis and abscess of foot    CKD stage 3a, GFR 45-59 ml/min (HCC)    Cocaine abuse (HCC)    + UDS on 05-12-24   DM (diabetes mellitus), type 2 (HCC)    ESBL (extended spectrum beta-lactamase) producing bacteria infection 04/18/2024   Hyperlipidemia    Hypertension    Methicillin resistant Staphylococcus aureus infection 07/14/2024   MRSA bacteremia    Normocytic anemia    Obesity    Tobacco abuse    Toe osteomyelitis, left (HCC)    Tuberculosis    tested positive,treated with medications.    Past Surgical History:  Procedure Laterality Date   AMPUTATION TOE Right 03/26/2024   Procedure: AMPUTATION,  TOE;  Surgeon: Harden Jerona GAILS, MD;  Location: [facility] OR;  Service: Orthopedics;  Laterality: Right;  RIGHT GREAT TOE AMPUTATION   BONE BIOPSY  04/18/2024   Procedure: BIOPSY, BONE (2ND METATARSAL);  Surgeon: Ashley Soulier, DPM;  Location: [facility] ORS;  Service: Orthopedics/Podiatry;;   FOOT SURGERY Left    I & D EXTREMITY Left 05/05/2022   Procedure: LEFT FOOT DEBRIDEMENT;  Surgeon: Harden Jerona GAILS, MD;  Location: [facility] OR;  Service: Orthopedics;  Laterality: Left;   INCISION AND DRAINAGE OF WOUND Right 06/05/2024   Procedure: DELAYED PRIMARY CLOSURE OF RIGHT FOOT WOUND, EXCISION OF DISTAL METATARSAL 2, 3, 4, and 5;  Surgeon: Ashley Soulier, DPM;  Location: [facility] ORS;  Service: Orthopedics/Podiatry;  Laterality: Right;   IRRIGATION AND DEBRIDEMENT FOOT Right 04/18/2024   Procedure: IRRIGATION AND DEBRIDEMENT FOOT, PLACEMENT OF ANTIBIOTIC BEADS;  Surgeon: Ashley Soulier, DPM;  Location: [facility] ORS;  Service: Orthopedics/Podiatry;  Laterality: Right;   IRRIGATION AND DEBRIDEMENT FOOT Left 06/29/2024   Procedure: IRRIGATION AND DEBRIDEMENT FOOT;  Surgeon: Lennie Barter, DPM;  Location: [facility] ORS;  Service: Orthopedics/Podiatry;  Laterality: Left;   MYRINGOTOMY WITH TUBE PLACEMENT Bilateral 07/20/2022   Procedure: MYRINGOTOMY WITH TUBE PLACEMENT;  Surgeon: Edda Mt, MD;  Location: [facility];  Service: ENT;  Laterality: Bilateral;  Diabetic   TEE WITHOUT CARDIOVERSION N/A 07/02/2024   Procedure: ECHOCARDIOGRAM, TRANSESOPHAGEAL;  Surgeon: Perla Evalene PARAS, MD;  Location: [facility] ORS;  Service: Cardiovascular;  Laterality: N/A;   TONSILLECTOMY     TRANSMETATARSAL AMPUTATION Right 04/23/2024   Procedure: AMPUTATION, FOOT, TRANSMETATARSAL;  Surgeon: Ashley Soulier, DPM;  Location: [facility] ORS;  Service: Orthopedics/Podiatry;  Laterality: Right;   TRANSMETATARSAL AMPUTATION Right 06/29/2024   Procedure: AMPUTATION, FOOT, TRANSMETATARSAL;  Surgeon: Lennie Barter, DPM;  Location: [facility] ORS;  Service:  Orthopedics/Podiatry;  Laterality: Right;  RIGHT TRANSMETATARSAL AMPUTATION REVISION    Social History   Socioeconomic History   Marital status: Single    Spouse name: Not on file   Number of children: Not on file   Years of education: Not on file   Highest education level: Not on file  Occupational History   Not on file  Tobacco Use   Smoking status: Former    Current packs/day: 0.00    Average packs/day: 0.3 packs/day for 25.0 years (6.3 ttl pk-yrs)    Types: Cigarettes    Start date: 09/17/1997    Quit date: 09/17/2022    Years since quitting: 2.0   Smokeless tobacco: Never   Tobacco comments:    Quit  Vaping Use   Vaping status: Never Used  Substance and Sexual Activity   Alcohol use: No   Drug use: No   Sexual activity: Yes  Other Topics Concern  Not on file  Social History Narrative   Not on file   Social Drivers of Health   Financial Resource Strain: Patient Declined (06/28/2023)   Received from [place] [employer] the [employer] Strain (CARDIA)    Difficulty of Paying Living Expenses: Patient declined  Food Insecurity: No Food Insecurity (09/26/2024)   Hunger Vital Sign    Worried About Running Out of Food in the Last Year: Never true    Ran Out of Food in the Last Year: Never true  Transportation Needs: No Transportation Needs (09/26/2024)   PRAPARE - Administrator, Civil Service (Medical): No    Lack of Transportation (Non-Medical): No  Physical Activity: Not on file  Stress: Not on file  Social Connections: Moderately Integrated (06/26/2024)   Social Connection and Isolation Panel    Frequency of Communication with Friends and Family: Three times a week    Frequency of Social Gatherings with Friends and Family: Twice a week    Attends Religious Services: 1 to 4 times per year    Active Member of [employer] or Organizations: Yes    Attends Banker Meetings: 1 to 4 times per year    Marital Status: Never  married  Intimate Partner Violence: Not At Risk (09/26/2024)   Humiliation, Afraid, Rape, and Kick questionnaire    Fear of Current or Ex-Partner: No    Emotionally Abused: No    Physically Abused: No    Sexually Abused: No    FH Sister, mother, grandmother - DM Brother- colon cancer  No Known Allergies   Current Outpatient Medications  Medication Sig Dispense Refill   Accu-Chek Softclix Lancets lancets Use to check blood sugar 3 times daily. 100 each 6   bisacodyl  (DULCOLAX) 5 MG EC tablet Take 2 tablets (10 mg total) by mouth at bedtime as needed for moderate constipation. 30 tablet 0   Blood Glucose Monitoring Suppl (ACCU-CHEK GUIDE) w/Device KIT Use to check blood sugar 3 times daily. 1 kit 0   chlorhexidine  (HIBICLENS ) 4 % external liquid Apply 15 mLs (1 Application total) topically as directed for 30 doses. Use as directed daily for 5 days every other week for 6 weeks. 946 mL 1   Cholecalciferol  (VITAMIN D3) 1.25 MG (50000 UT) CAPS Take 1 capsule (50,000 Units total) by mouth every Monday. 4 capsule 0   Continuous Blood Gluc Transmit (DEXCOM G6 TRANSMITTER) MISC Use to check blood sugar three times daily. Change transmitter once every 909 days. E11.69 1 each 1   gabapentin  (NEURONTIN ) 300 MG capsule Take 1 capsule (300 mg total) by mouth 2 (two) times daily for neuropathy pain. 60 capsule 0   glucose blood (ACCU-CHEK GUIDE TEST) test strip Use to check blood sugar 3 times daily. 100 each 6   insulin  aspart (NOVOLOG ) 100 UNIT/ML FlexPen Inject 0-15 Units into the skin 3 (three) times daily before meals.  Correction coverage: Moderate (average weight, post-op) CBG 70 - 120: 0 units CBG 121 - 150: 2 units CBG 151 - 200: 3 units CBG 201 - 250: 5 units CBG 251 - 300: 8 units CBG 301 - 350: 11 units CBG 351 - 400: 15 units CBG > 400: call MD 15 mL 11   Insulin  NPH, Human,, Isophane, (HUMULIN  N KWIKPEN) 100 UNIT/ML Kiwkpen Inject 22 Units into the skin 2 (two) times daily. 15 mL 0    Insulin  Pen Needle (TECHLITE PEN NEEDLES) 32G X 4 MM MISC Use as directed to  inject insulin  up to 4 times daily. 100 each 0   iron  polysaccharides (NIFEREX) 150 MG capsule Take 1 capsule (150 mg total) by mouth daily. 90 capsule 0   losartan  (COZAAR ) 25 MG tablet Take 0.5 tablets (12.5 mg total) by mouth daily.     metFORMIN  (GLUCOPHAGE -XR) 500 MG 24 hr tablet Take 2 tablets (1,000 mg total) by mouth 2 (two) times daily with a meal. 120 tablet 0   oxyCODONE -acetaminophen  (PERCOCET/ROXICET) 5-325 MG tablet Take 1-2 tablets by mouth every 4 (four) hours as needed for up to 5 days for moderate pain (pain score 4-6) or severe pain (pain score 7-10). 30 tablet 0   polyethylene glycol powder (GLYCOLAX /MIRALAX ) 17 GM/SCOOP powder Take 17 g by mouth 2 (two) times daily. Mix as directed. (Patient taking differently: Take 17 g by mouth 2 (two) times daily as needed for mild constipation. Mix as directed.) 238 g 0   No current facility-administered medications for this visit.     Abtx:  Anti-infectives (From admission, onward)    None       REVIEW OF SYSTEMS:  Const: negative fever, negative chills, negative weight loss Eyes: negative diplopia or visual changes, negative eye pain ENT: negative coryza, negative sore throat Resp: negative cough, hemoptysis, dyspnea Cards: negative for chest pain, palpitations, lower extremity edema GU: negative for frequency, dysuria and hematuria GI: Negative for abdominal pain, diarrhea, bleeding, constipation Skin: negative for rash and pruritus Heme: negative for easy bruising and gum/nose bleeding MS:rt knee pain Neurolo:negative for headaches, dizziness, vertigo, memory problems  Psych: anxiety, depression  Endocrine:  diabetes-  Allergy/Immunology- negative for any medication or food allergies ?  Objective:  VITALS:  BP (!) 143/74   Pulse 90   Temp (!) 94.9 F (34.9 C) (Temporal)   Wt 243 lb (110.2 kg)   SpO2 99%   BMI 34.87 kg/m   PHYSICAL  EXAM:  General: Alert, cooperative, some distress on walking, appears stated age.  Lungs: Clear to auscultation bilaterally. No Wheezing or Rhonchi. No rales. Heart: Regular rate and rhythm, no murmur, rub or gallop. Abdomen: Soft, non-tender,not distended. Bowel sounds normal. No masses Extremities:  rt knee no swelling appreciated- no warmth,  Full extension and flexion restricted  Rt TMA- healed completely    Left foot Ulcer chronic stable    Skin: No rashes or lesions. Or bruising Lymph: Cervical, supraclavicular normal. Neurologic: peripheral neuropathy    ? Impression/Recommendation  Diabetes mellitus with neuropathy   Rt knee acute pain of > 1 week- recent MRI some degenerative changes and joint fluid and synovial enhancement with contrast concerning for Septi carthritis but no fluid was seen on US  and so no arthrocentesis attempted HE still has pain Will refer to [facility] Spoke to Dr.Poggi He will Get Dr.Kubinski to aspirate the knee and send for cell count, crystals and culture   Rt TMA-- healed completely   H/o MRSA bacteremia on 7/24 one set -repeat BC on 7/28 Neg  - TEE neg 07/02/24 Completed 6 weeks of IV daptomycin .   polymicrobial infection at the rt TMA site for which he underwent revision TMA on 06/29/24- Culture MRSA, providencia, klebsiella , ecoli Pathology no osteo completed 6 weeks of IV dapto and Iv ertapenem  on 08/06/24 And no antibiotics since then Healed completely  DM peripheral neuropathy  On metformin   Cocaine use- clean now  Chronic wounds left foot  -- stable- had acell graft recently on 06/29/24  Had bad infection and  osteomyelitis- debridement in  June 2023- took 5 months of antibiotic Was told to get amputation, wanted to save his foot at all cost- so transferred care ot [facility] wound center- started hyperbaric oxygen - and got 9 and then stopped as he could not go there 5 times a week.  Completed cefadroxil , levaquin  and flagyl   X 4 months and prior to that nearly 8 weeks of IV Oral antibiotics stopped 10/31/22 The wound looks really good and nice granulation tissue- was getting apligraf at the wound clinic He then moved to [place] in February 2024  to take care of his mother The wound flared up to on his left foot and he was hospitalized at [facility] in July 2024  and was released on vancomycin  and ceftriaxone  and then  p.o. linezolid  and p.o. Augmentin . He completed antibiotics sept/oct 2024 and was off any antibiotics The left foot wounds are not an issue  But they have not closed completley stable  Discussed the management with the patient Will follow after arthrocentesis  Appt has been made for 10/31 Friday at 11.30 am at [facility] clinic  Informed his brother on the phone about the appt ( as it was made  after patient left my clinic and I couldn't reach him on the phone)

## 2024-10-02 ENCOUNTER — Encounter (HOSPITAL_BASED_OUTPATIENT_CLINIC_OR_DEPARTMENT_OTHER): Admitting: Internal Medicine

## 2024-10-02 ENCOUNTER — Telehealth: Payer: Self-pay

## 2024-10-02 ENCOUNTER — Inpatient Hospital Stay: Admitting: Infectious Diseases

## 2024-10-02 LAB — CBC WITH DIFFERENTIAL/PLATELET
Absolute Lymphocytes: 1300 {cells}/uL (ref 850–3900)
Absolute Monocytes: 825 {cells}/uL (ref 200–950)
Basophils Absolute: 10 {cells}/uL (ref 0–200)
Basophils Relative: 0.1 %
Eosinophils Absolute: 155 {cells}/uL (ref 15–500)
Eosinophils Relative: 1.6 %
HCT: 27.9 % — ABNORMAL LOW (ref 38.5–50.0)
Hemoglobin: 8.5 g/dL — ABNORMAL LOW (ref 13.2–17.1)
MCH: 24.4 pg — ABNORMAL LOW (ref 27.0–33.0)
MCHC: 30.5 g/dL — ABNORMAL LOW (ref 32.0–36.0)
MCV: 80.2 fL (ref 80.0–100.0)
MPV: 10.7 fL (ref 7.5–12.5)
Monocytes Relative: 8.5 %
Neutro Abs: 7411 {cells}/uL (ref 1500–7800)
Neutrophils Relative %: 76.4 %
Platelets: 472 Thousand/uL — ABNORMAL HIGH (ref 140–400)
RBC: 3.48 Million/uL — ABNORMAL LOW (ref 4.20–5.80)
RDW: 15.2 % — ABNORMAL HIGH (ref 11.0–15.0)
Total Lymphocyte: 13.4 %
WBC: 9.7 Thousand/uL (ref 3.8–10.8)

## 2024-10-02 LAB — C-REACTIVE PROTEIN: CRP: 270 mg/L — ABNORMAL HIGH (ref <8.0)

## 2024-10-02 LAB — SEDIMENTATION RATE: Sed Rate: >130 mm/h — ABNORMAL HIGH (ref 0–20)

## 2024-10-02 NOTE — Transitions of Care (Post Inpatient/ED Visit) (Signed)
   10/02/2024  Name: Maliq Pilley MRN: [id number] DOB: 14-Jun-1966  Today's TOC FU Call Status: Today's TOC FU Call Status:: Unsuccessful Call (3rd Attempt) Unsuccessful Call (3rd Attempt) Date: 10/02/24  Attempted to reach the patient regarding the most recent Inpatient/ED visit.  Follow Up Plan: No further outreach attempts will be made at this time. We have been unable to contact the patient.  Suprina Mandeville J. Daegen Berrocal RN, MSN [facility], [facility] RN Care Manager Direct Dial: [phone]  Fax: [phone] Website: [email or web address]

## 2024-10-03 ENCOUNTER — Ambulatory Visit (HOSPITAL_BASED_OUTPATIENT_CLINIC_OR_DEPARTMENT_OTHER): Admitting: Internal Medicine

## 2024-10-03 NOTE — Progress Notes (Signed)
 Foot and Ankle Surgery Patient Visit  Chief Complaint:   Chief Complaint  Patient presents with  . Wound Check    Recheck bilateral foot wounds .     Subjective  HPI  [name] is a 58 y.o. male who presents for Wound Check (Recheck bilateral foot wounds . )  Patient had revision R TMA and L foot wound debridement with graft applications on 06/29/24.  Patient has no-showed for the last several visits and has not followed up appropriately with the wound care as he said he would have last time he was in.  Patient recently no-showed also to his orthopedic visit for right knee aspiration with Dr. Sharrie History of Present Illness [name] is a 58 year old male with chronic foot wounds who presents with increased pain to the right foot and concerns about infection to the left foot.   He experiences increased pain in his right foot but more so knee.  The pain is burning, tingling, and numb, consistent with neuropathy. He has difficulty supporting himself when getting out of bed, which he attributes to knee issues. He frequently uses Tylenol  for pain relief.  He has a chronic wound on his left foot, present for years. A synthetic skin graft was previously attempted, improving the condition temporarily, but the wound remains open and deep. Recently, a new ulceration developed near the second toe base of medially of the right foot, starting a couple of days ago per patient. There is a foul odor from the wound.  He was seen by ID yesterday but noted to not have any signs of infection.  No nausea, vomiting, fevers, chills, or increased redness and swelling in the affected area.  Patient Active Problem List  Diagnosis  . AKI (acute kidney injury)  . Amputation of right great toe ()  . Cellulitis and abscess of foot  . Chronic kidney disease, stage 3a (CMS-HCC)  . Diabetic foot infection (CMS/HHS-HCC)  . Erectile dysfunction  . Hyperkalemia  . Hyperlipidemia  . Hypertension associated with diabetes  (CMS/HHS-HCC)  . Injury of left foot  . Medication management  . MRSA bacteremia  . Necrotizing soft tissue infection  . Normocytic anemia  . Obesity (BMI 30-39.9), unspecified  . Pain in left foot  . PICC (peripherally inserted central catheter) in place  . Pyogenic inflammation of bone (CMS/HHS-HCC)  . Tobacco abuse  . Toe osteomyelitis, left (CMS/HHS-HCC)  . Type 2 diabetes mellitus (CMS/HHS-HCC)  . Disorder associated with type 2 diabetes mellitus (CMS/HHS-HCC)    No Known Allergies  Social Drivers of Health   Tobacco Use: Medium Risk (10/01/2024)   Received from [facility]   Patient History   . Smoking Tobacco Use: Former   . Smokeless Tobacco Use: Never   . Passive Exposure: Not on file  Alcohol Use: Not on file  Financial Resource Strain: Not on file  Food Insecurity: No Food Insecurity (09/26/2024)   Received from [facility]   Hunger Vital Sign   . Within the past 12 months, you worried that your food would run out before you got the money to buy more.: Never true   . Within the past 12 months, the food you bought just didn't last and you didn't have money to get more.: Never true  Transportation Needs: No Transportation Needs (09/26/2024)   Received from [facility] - Transportation   . In the past 12 months, has lack of transportation kept you from medical appointments or from getting  medications?: No   . In the past 12 months, has lack of transportation kept you from meetings, work, or from getting things needed for daily living?: No  Physical Activity: Not on file  Stress: Not on file  Social Connections: Moderately Integrated (06/26/2024)   Received from [facility]   Social Connection and Isolation Panel   . In a typical week, how many times do you talk on the phone with family, friends, or neighbors?: Three times a week   . How often do you get together with friends or relatives?: Twice a week   . How often do you attend church or religious  services?: 1 to 4 times per year   . Do you belong to any clubs or organizations such as church groups, unions, fraternal or athletic groups, or school groups?: Yes   . How often do you attend meetings of the clubs or organizations you belong to?: 1 to 4 times per year   . Are you married, widowed, divorced, separated, never married, or living with a partner?: Never married  Depression: Not on file  Housing Stability: Unknown (10/06/2024)   Housing Stability Vital Sign   . Unable to Pay for Housing in the Last Year: Not on file   . Number of Times Moved in the Last Year: Not on file   . Homeless in the Last Year: No  Utilities: Not At Risk (09/26/2024)   Received from [facility]   . In the past 12 months has the electric, gas, oil, or water company threatened to shut off services in your home?: No  Health Literacy: Adequate Health Literacy (05/01/2024)   Received from [facility]   B1300 Health Literacy   . Frequency of need for help with medical instructions: Never    Outpatient Medications Prior to Visit  Medication Sig Dispense Refill  . amLODIPine  (NORVASC ) 5 MG tablet 1 tablet Orally Once a day; Duration: 90 days    . atorvastatin  (LIPITOR) 20 MG tablet Oral    . gabapentin  (NEURONTIN ) 300 MG capsule Take 1 capsule (300 mg total) by mouth at bedtime 30 capsule 1  . metFORMIN  (GLUCOPHAGE -XR) 500 MG XR tablet Take 1,000 mg by mouth 2 (two) times daily with meals     No facility-administered medications prior to visit.      Objective  Vitals:   10/06/24 1557  BP: 106/71  Weight: 99.8 kg (220 lb 0.3 oz)  Height: 175.3 cm (5' 9.02)  PainSc:   8  PainLoc: Foot     Body mass index is 32.48 kg/m.  Home Vitals:     Physical Exam Physical Exam  General/Constitutional: No apparent distress: well-nourished and well developed.   Psych: Normal mood and affect, oriented to person, place and time (AOx3)   Vascular: DP/PT pulses palpable bilateral,  capillary fill time intact to digits bilaterally, hair growth present to digits of bilateral lower extremities.   Neuro: Light touch sensation intact bilateral lower extremities.   Derm:  R TMA site appears well coapted, no openings  L plantar first/2nd MTPJ ulcer - wound measures 4 x 2 x 3cm  Bed appears to be granular after debridement, probes very deep at the distal portion near the 2nd MTPJ, close to bone, would be surprised if no bone exposed.  L plantolateral midfoot ulcer with graft stapled - removed staples, wound measures 1 x 1.2 x 0.5 cm, no SOI, wound bed appears to be granular after debridement.  No obvious  bone exposed.  L 2nd toe medial base, flexor tendon exposed, foul odor, no erythema or edema increased to this area, serous drainage present, measures 1.5 x 2 x 1.5cm, probes close to bone and tunnels along the flexor tendon.   MSK: 5/5 muscle strength bilateral lower extremity muscle groups.  Fat pad atrophy left forefoot  Results Debridement of ulcer: Location:  L first/2nd MTPJ plantar Pre-debridement measurement: 4 x 2 x 3 cm Post-debridement measurement: same Tissue removed:   HPK tissue, biofilm Ulcer was debrided sharply with combination of tissue nippers and scalpel blade into the Subcutaneous Tissue   Debridment of ulcer: Location:  L 5th met base Pre-debridement measurement: 1 x 1.2 x 0.5 cm Post-debridement measurement: same Tissue removed:   HPK tissue, biofilm Ulcer was debrided sharply with combination of tissue nippers and scalpel blade into the Subcutaneous Tissue   Debridment of ulcer: Location:  L 2nd toe medial base Pre-debridement measurement: 1.5 x 2 x 1.5cm Post-debridement measurement: same Tissue removed:   tendon, bioflim, fibrous tissue Ulcer was debrided sharply with combination of tissue nippers and scalpel blade into the Tendon    L foot 3 views AP, lateral, lateral oblique: Cavovarus foot structure with metatarsus adductus.  Fairly  severe degenerative changes present to the metatarsal phalangeal joints 2 through 5 and also to a certain extent first metatarsal phalange joint.  Digits appear to be dorsally dislocated on the metatarsal phalangeal joints 2 through 5.  Difficult to exclude osteomyelitis, patient's past medical history he did have bones exposed from previous I&D so it is likely he has some degree of chronic residual osteomyelitis to the left forefoot.     Assessment/Plan:    Assessment & Plan Chronic wounds to the left foot - X-ray imaging reviewed.  Difficult to exclude osteomyelitis due to chronic changes to the forefoot. - Wound debridement described above.  Wounds appear worsening with increased depth, concern for possible underlying osteomyelitis of the 2nd ray.   - Betadine  wet to dry dressings applied.  Rec daily changes.  Continue ambulation in postop shoe with heel contact. - R knee pain to be addressed by ortho.  Sent to Dr. Sharrie today for aspiration/further care. - Wound culture sent off. - Rx: Clinda and Bactrim 7d courses due to previous hx of Abx resistant microbes. - Discussed high risk for limb loss due to DM, PVD, chronic wounds with infection.  Rec thinking hard about next steps.  Discussed BKA versus attempt at TMA with rotational skin flap but hard to see a pathway that that would work due to chronicity of skin changes from previous surgery.  If things worsen at all, he should go to the ED for IV Abx and considered more urgent surgery. - Patient to continue close f/u with ID.  PVD - Continue f/u with [facility]  DM2 with polyneuropathy - High risk for limb loss - Discussed daily foot inspections, proper glycemic control, use of lotions to feet daily, no barefoot walking, and use of supportive shoes at all times.     Diagnoses and all orders for this visit:  Ulcer of left foot with necrosis of muscle (CMS/HHS-HCC) -     X-ray foot left 3 plus views; Future -     Anaerobic and Aerobic  Culture - [facility]  S/P transmetatarsal amputation of foot, right (CMS/HHS-HCC) -     X-ray foot left 3 plus views; Future -     Anaerobic and Aerobic Culture - [facility]  PVD (peripheral vascular disease) -  X-ray foot left 3 plus views; Future -     Anaerobic and Aerobic Culture - [facility]  Amputated toe, left -     X-ray foot left 3 plus views; Future -     Anaerobic and Aerobic Culture - [facility]  History of transmetatarsal amputation of right foot (CMS/HHS-HCC) -     X-ray foot left 3 plus views; Future -     Anaerobic and Aerobic Culture - [facility]  Type 2 diabetes mellitus with polyneuropathy (CMS/HHS-HCC) -     X-ray foot left 3 plus views; Future -     Anaerobic and Aerobic Culture - [facility]  Other orders -     clindamycin (CLEOCIN) 300 MG capsule; Take 1 capsule (300 mg total) by mouth 4 (four) times daily for 7 days -     sulfamethoxazole-trimethoprim (BACTRIM DS) 800-160 mg tablet; Take 1 tablet (160 mg of trimethoprim total) by mouth 2 (two) times daily for 7 days    Return in about 1 week (around 10/13/2024) for Recheck L foot ulcer.  An after visit summary was provided for the patient either in written format (printed) or through MyChart if needed/necessary .  This note has been created using automated tools and reviewed for accuracy by ANDREW MICHAEL BAKER.  Translation done by AI may create words close to the sound of words used at visit, but errors could be present.  Note was reviewed.

## 2024-10-06 ENCOUNTER — Other Ambulatory Visit
Admission: RE | Admit: 2024-10-06 | Discharge: 2024-10-06 | Disposition: A | Discharge status: Home / Self Care | Source: Ambulatory Visit | Attending: Sports Medicine | Admitting: Sports Medicine

## 2024-10-06 ENCOUNTER — Telehealth: Payer: Self-pay

## 2024-10-06 ENCOUNTER — Other Ambulatory Visit: Payer: Self-pay

## 2024-10-06 ENCOUNTER — Encounter: Payer: Self-pay | Admitting: Radiology

## 2024-10-06 DIAGNOSIS — M25461 Effusion, right knee: Secondary | ICD-10-CM | POA: Diagnosis present

## 2024-10-06 LAB — SYNOVIAL CELL COUNT + DIFF, W/ CRYSTALS
Crystals, Fluid: NONE SEEN
Eosinophils-Synovial: 0 %
Lymphocytes-Synovial Fld: 2 %
Monocyte-Macrophage-Synovial Fluid: 4 %
Neutrophil, Synovial: 94 %
WBC, Synovial: 24600 /mm3 — ABNORMAL HIGH (ref 0–200)

## 2024-10-06 NOTE — Telephone Encounter (Signed)
 Pt called to inform [name] that he plans on going to ED today for knee pain. Knee pain has been going on x1 week.  Was told that it could be due to gout or infection.  Pt is also asking for name of cream for his toe. Provided him with medication that was prescribed.  Lorenda CHRISTELLA Code, RMA

## 2024-10-06 NOTE — Telephone Encounter (Signed)
 Pt stated that his transportation was not able to take him to his appts. Will reschedule wound care appt for this week.

## 2024-10-11 ENCOUNTER — Other Ambulatory Visit: Payer: Self-pay

## 2024-10-11 ENCOUNTER — Emergency Department (HOSPITAL_COMMUNITY)
Admission: EM | Admit: 2024-10-11 | Discharge: 2024-10-11 | Disposition: A | Discharge status: Home / Self Care | Attending: Emergency Medicine | Admitting: Emergency Medicine

## 2024-10-11 ENCOUNTER — Encounter (HOSPITAL_COMMUNITY): Payer: Self-pay

## 2024-10-11 DIAGNOSIS — M1711 Unilateral primary osteoarthritis, right knee: Secondary | ICD-10-CM | POA: Diagnosis not present

## 2024-10-11 DIAGNOSIS — M25461 Effusion, right knee: Secondary | ICD-10-CM | POA: Insufficient documentation

## 2024-10-11 DIAGNOSIS — E119 Type 2 diabetes mellitus without complications: Secondary | ICD-10-CM | POA: Diagnosis not present

## 2024-10-11 DIAGNOSIS — Z7984 Long term (current) use of oral hypoglycemic drugs: Secondary | ICD-10-CM | POA: Diagnosis not present

## 2024-10-11 DIAGNOSIS — Z794 Long term (current) use of insulin: Secondary | ICD-10-CM | POA: Insufficient documentation

## 2024-10-11 DIAGNOSIS — M25462 Effusion, left knee: Secondary | ICD-10-CM

## 2024-10-11 DIAGNOSIS — M25561 Pain in right knee: Secondary | ICD-10-CM | POA: Diagnosis present

## 2024-10-11 MED ORDER — OXYCODONE HCL 5 MG PO TABS
5.0000 mg | ORAL_TABLET | Freq: Once | ORAL | Status: AC
  Administered 2024-10-11: 5 mg via ORAL
  Filled 2024-10-11: qty 1

## 2024-10-11 MED ORDER — OXYCODONE-ACETAMINOPHEN 5-325 MG PO TABS: 1.0000 | ORAL_TABLET | Freq: Three times a day (TID) | ORAL | 0 refills | Status: DC | PRN

## 2024-10-11 MED ORDER — OXYCODONE-ACETAMINOPHEN 5-325 MG PO TABS
1.0000 | ORAL_TABLET | Freq: Three times a day (TID) | ORAL | 0 refills | Status: DC | PRN
  Filled 2024-10-11: qty 9, 3d supply, fill #0

## 2024-10-11 MED ORDER — LIDOCAINE-EPINEPHRINE (PF) 2 %-1:200000 IJ SOLN
10.0000 mL | Freq: Once | INTRAMUSCULAR | Status: AC
  Administered 2024-10-11: 10 mL
  Filled 2024-10-11: qty 20

## 2024-10-11 NOTE — Discharge Instructions (Addendum)
 You were seen in the emergency room for pain to your knee.  Our suspicion for infection is quite low.  It appears clinically that most likely the symptoms are because of severe arthritis.  Follow-up with either the orthopedic doctor you saw earlier or the orthopedic doctor in [place], whose contact information has been provided for follow-up.  The results of the cultures will be followed by the hospital.  We will contact you if it is positive.  Alternatively, he can also have your orthopedic doctor share the results with you during your visit on Monday.

## 2024-10-11 NOTE — ED Provider Notes (Signed)
  Procedures   If procedures were preformed on this patient, they are listed below:  .Joint Aspiration/Arthrocentesis  Date/Time: 10/11/2024 10:08 PM  Performed by: Billy Pal, MD Authorized by: Charlyn Sora, MD   Consent:    Consent obtained:  Verbal   Consent given by:  Patient   Risks, benefits, and alternatives were discussed: yes     Risks discussed:  Bleeding, infection, nerve damage, incomplete drainage and pain   Alternatives discussed:  No treatment, delayed treatment and alternative treatment Universal protocol:    Patient identity confirmed:  Verbally with patient Location:    Location:  Knee   Knee:  R knee Anesthesia:    Anesthesia method:  Local infiltration   Local anesthetic:  Lidocaine  1% WITH epi Procedure details:    Preparation: Patient was prepped and draped in usual sterile fashion     Needle gauge:  18 G   Ultrasound guidance: yes     Approach:  Medial   Aspirate amount:  60ml   Aspirate characteristics:  Yellow and blood-tinged   Steroid injected: no     Specimen collected: yes   Post-procedure details:    Dressing:  Adhesive bandage   Procedure completion:  Tolerated well, no immediate complications    Please note that this documentation was produced with the assistance of voice-to-text technology and may contain errors.     Billy Pal, MD 10/11/24 2209    Charlyn Sora, MD 10/11/24 2222

## 2024-10-11 NOTE — ED Triage Notes (Signed)
 Pt c/o right knee pain, reports he fluid was draw off about 8-10 days ago, they didn't know if it was gout or not, hasn't heard any results. Pain persists. Taking tylenol  and gabapentin  without relief.

## 2024-10-11 NOTE — ED Provider Notes (Signed)
 [facility] Provider Note   CSN: [id number] Arrival date & time: 10/11/24  1559     Patient presents with: No chief complaint on file.   [name] is a 58 y.o. male.   HPI     58 year old male comes in with chief complaint of knee pain.  Patient has history of severe diabetes status post amputation of the right lower extremity, diabetic foot ulcers.   He comes in because of persistent knee pain.  Patient was seen in the ER and admitted to the hospital on October 24 for the knee pain and right foot wound.  He alleges that there was a dry tap in the hospital, patient subsequently followed up with his podiatrist on 11-3 after being discharged from the hospital on 10-27.  The podiatrist took patient to the sports medicine doctor who is working in the same building, patient had arthrocentesis done over there.  He has not heard back from the orthopedic service and is having pain again.  He ran out of his pain medications yesterday.  Patient is able to ambulate, but with a walker.  He denies any fevers, chills.  Prior to Admission medications   Medication Sig Start Date End Date Taking? Authorizing Provider  oxyCODONE -acetaminophen  (PERCOCET/ROXICET) 5-325 MG tablet Take 1 tablet by mouth every 8 (eight) hours as needed for up to 3 days for severe pain (pain score 7-10). 10/11/24 10/14/24 Yes Charlyn Sora, MD  Accu-Chek Softclix Lancets lancets Use to check blood sugar 3 times daily. 04/01/24   Newlin, Enobong, MD  bisacodyl  (DULCOLAX) 5 MG EC tablet Take 2 tablets (10 mg total) by mouth at bedtime as needed for moderate constipation. 04/29/24   Von Bellis, MD  Blood Glucose Monitoring Suppl (ACCU-CHEK GUIDE) w/Device KIT Use to check blood sugar 3 times daily. 04/01/24   Newlin, Enobong, MD  chlorhexidine  (HIBICLENS ) 4 % external liquid Apply 15 mLs (1 Application total) topically as directed for 30 doses. Use as directed daily for 5 days every  other week for 6 weeks. 06/05/24   Ashley Soulier, DPM  Cholecalciferol  (VITAMIN D3) 1.25 MG (50000 UT) CAPS Take 1 capsule (50,000 Units total) by mouth every Monday. 08/11/24     Continuous Blood Gluc Transmit (DEXCOM G6 TRANSMITTER) MISC Use to check blood sugar three times daily. Change transmitter once every 909 days. E11.69 06/05/22   Newlin, Enobong, MD  gabapentin  (NEURONTIN ) 300 MG capsule Take 1 capsule (300 mg total) by mouth 2 (two) times daily for neuropathy pain. 08/05/24     glucose blood (ACCU-CHEK GUIDE TEST) test strip Use to check blood sugar 3 times daily. 04/01/24   Newlin, Enobong, MD  insulin  aspart (NOVOLOG ) 100 UNIT/ML FlexPen Inject 0-15 Units into the skin 3 (three) times daily before meals.  Correction coverage: Moderate (average weight, post-op) CBG 70 - 120: 0 units CBG 121 - 150: 2 units CBG 151 - 200: 3 units CBG 201 - 250: 5 units CBG 251 - 300: 8 units CBG 301 - 350: 11 units CBG 351 - 400: 15 units CBG > 400: call MD 04/29/24   Von Bellis, MD  Insulin  NPH, Human,, Isophane, (HUMULIN  N KWIKPEN) 100 UNIT/ML Kiwkpen Inject 22 Units into the skin 2 (two) times daily. 08/05/24     Insulin  Pen Needle (TECHLITE PEN NEEDLES) 32G X 4 MM MISC Use as directed to inject insulin  up to 4 times daily. 08/11/24   Newlin, Enobong, MD  iron  polysaccharides (NIFEREX) 150 MG  capsule Take 1 capsule (150 mg total) by mouth daily. 04/30/24 10/01/24  Von Bellis, MD  losartan  (COZAAR ) 25 MG tablet Take 0.5 tablets (12.5 mg total) by mouth daily. 09/29/24 09/29/25  Samtani, Jai-Gurmukh, MD  metFORMIN  (GLUCOPHAGE -XR) 500 MG 24 hr tablet Take 2 tablets (1,000 mg total) by mouth 2 (two) times daily with a meal. 08/05/24     polyethylene glycol powder (GLYCOLAX /MIRALAX ) 17 GM/SCOOP powder Take 17 g by mouth 2 (two) times daily. Mix as directed. Patient taking differently: Take 17 g by mouth 2 (two) times daily as needed for mild constipation. Mix as directed. 04/29/24   Von Bellis, MD     Allergies: Patient has no known allergies.    Review of Systems  Updated Vital Signs BP (!) 158/80   Pulse 84   Temp 98.5 F (36.9 C) (Oral)   Resp 18   SpO2 100%   Physical Exam Vitals and nursing note reviewed.  Constitutional:      Appearance: He is well-developed.  HENT:     Head: Atraumatic.  Cardiovascular:     Rate and Rhythm: Normal rate.  Pulmonary:     Effort: Pulmonary effort is normal.  Musculoskeletal:        General: Swelling and tenderness present.     Cervical back: Neck supple.     Comments: Right knee has slight edema.  Patient able to flex and extend at least 45 degrees actively, he has tenderness to palpation.  Patient able to stand up and bear weight  Skin:    General: Skin is warm.     Findings: No erythema.  Neurological:     Mental Status: He is alert and oriented to person, place, and time.     (all labs ordered are listed, but only abnormal results are displayed) Labs Reviewed  BODY FLUID CULTURE W GRAM STAIN  SYNOVIAL CELL COUNT + DIFF, W/ CRYSTALS  URIC ACID, BODY FLUID    EKG: None  Radiology: No results found.   Procedures   Medications Ordered in the ED  oxyCODONE  (Oxy IR/ROXICODONE ) immediate release tablet 5 mg (5 mg Oral Given 10/11/24 1909)  lidocaine -EPINEPHrine  (XYLOCAINE  W/EPI) 2 %-1:200000 (PF) injection 10 mL (10 mLs Infiltration Given 10/11/24 2101)    Clinical Course as of 10/11/24 2218  Sat Oct 11, 2024  2217 60 cc of cloudy synovial fluid was aspirated without any complications [AN]    Clinical Course User Index [AN] Charlyn Sora, MD                                 Medical Decision Making Amount and/or Complexity of Data Reviewed Labs: ordered.  Risk Prescription drug management.   58 year old male with history of diabetic ulcers, osteomyelitis comes in with chief complaint of persistent left knee pain.  He was seen in the ER for the same complaint on 10-24.  It appears that there was no  significant effusion for attempt of arthrocentesis in the hospital.  He was discharged on 10-27.  He had an MRI of the knee that did reveal moderate-sized effusion.  Subsequently patient followed up with nonoperative orthopedic sports medicine clinic and had arthrocentesis completed successfully, but he has not heard back from it.  From clinical perspective, patient able to range his knee and able to bear weight, the symptoms have been present now since 10-24 at the minimum -all of this makes it highly unlikely for this to be  septic arthritis.  I did review patient's labs.  He does have negative crystals but positive white blood cells in the 20,000 range.  It does not appear that synovial fluid culture was sent.  Given this discrepancy, I did consult orthopedic surgery.  I spoke with Dr. Lorelle, who also had reviewed patient's chart.  He indicated that the clinical threshold for septic arthritis is low in someone with WBC less than 50,000.  I discussed with him the physical exam findings, that further made both of us  comfortable that most likely patient does not have septic arthritis.  We will go and assess the joint with ultrasound, if there is a large pocket then we will send fluid and cultures.  Final diagnoses:  Primary osteoarthritis of right knee  Knee effusion, left    ED Discharge Orders          Ordered    oxyCODONE -acetaminophen  (PERCOCET/ROXICET) 5-325 MG tablet  Every 8 hours PRN        10/11/24 AVA Charlyn Sora, MD 10/11/24 2218

## 2024-10-12 ENCOUNTER — Other Ambulatory Visit: Payer: Self-pay

## 2024-10-12 LAB — SYNOVIAL CELL COUNT + DIFF, W/ CRYSTALS
Crystals, Fluid: NONE SEEN
Eosinophils-Synovial: 0 % (ref 0–1)
Lymphocytes-Synovial Fld: 4 % (ref 0–20)
Monocyte-Macrophage-Synovial Fluid: 6 % — ABNORMAL LOW (ref 50–90)
Neutrophil, Synovial: 90 % — ABNORMAL HIGH (ref 0–25)
WBC, Synovial: 20160 /mm3 — ABNORMAL HIGH (ref 0–200)

## 2024-10-13 LAB — URIC ACID, BODY FLUID: Uric Acid Body Fluid: 4.5 mg/dL

## 2024-10-15 ENCOUNTER — Emergency Department

## 2024-10-15 ENCOUNTER — Encounter: Payer: Self-pay | Admitting: Emergency Medicine

## 2024-10-15 ENCOUNTER — Other Ambulatory Visit: Payer: Self-pay

## 2024-10-15 ENCOUNTER — Inpatient Hospital Stay
Admission: EM | Admit: 2024-10-15 | Discharge: 2024-10-23 | DRG: 617 | Disposition: A | Discharge status: Skilled Nursing Facility | Attending: Emergency Medicine | Admitting: Emergency Medicine

## 2024-10-15 DIAGNOSIS — B965 Pseudomonas (aeruginosa) (mallei) (pseudomallei) as the cause of diseases classified elsewhere: Secondary | ICD-10-CM | POA: Diagnosis not present

## 2024-10-15 DIAGNOSIS — M25461 Effusion, right knee: Secondary | ICD-10-CM | POA: Diagnosis present

## 2024-10-15 DIAGNOSIS — M86672 Other chronic osteomyelitis, left ankle and foot: Secondary | ICD-10-CM | POA: Diagnosis present

## 2024-10-15 DIAGNOSIS — B951 Streptococcus, group B, as the cause of diseases classified elsewhere: Secondary | ICD-10-CM | POA: Diagnosis present

## 2024-10-15 DIAGNOSIS — Z7984 Long term (current) use of oral hypoglycemic drugs: Secondary | ICD-10-CM

## 2024-10-15 DIAGNOSIS — E785 Hyperlipidemia, unspecified: Secondary | ICD-10-CM | POA: Diagnosis present

## 2024-10-15 DIAGNOSIS — D631 Anemia in chronic kidney disease: Secondary | ICD-10-CM | POA: Diagnosis present

## 2024-10-15 DIAGNOSIS — M8610 Other acute osteomyelitis, unspecified site: Secondary | ICD-10-CM | POA: Diagnosis present

## 2024-10-15 DIAGNOSIS — Z8611 Personal history of tuberculosis: Secondary | ICD-10-CM

## 2024-10-15 DIAGNOSIS — Z6833 Body mass index (BMI) 33.0-33.9, adult: Secondary | ICD-10-CM

## 2024-10-15 DIAGNOSIS — E1165 Type 2 diabetes mellitus with hyperglycemia: Secondary | ICD-10-CM | POA: Diagnosis present

## 2024-10-15 DIAGNOSIS — L97529 Non-pressure chronic ulcer of other part of left foot with unspecified severity: Secondary | ICD-10-CM | POA: Diagnosis present

## 2024-10-15 DIAGNOSIS — E1122 Type 2 diabetes mellitus with diabetic chronic kidney disease: Secondary | ICD-10-CM | POA: Diagnosis present

## 2024-10-15 DIAGNOSIS — Z87891 Personal history of nicotine dependence: Secondary | ICD-10-CM | POA: Diagnosis not present

## 2024-10-15 DIAGNOSIS — E875 Hyperkalemia: Secondary | ICD-10-CM | POA: Diagnosis present

## 2024-10-15 DIAGNOSIS — Z8614 Personal history of Methicillin resistant Staphylococcus aureus infection: Secondary | ICD-10-CM | POA: Diagnosis not present

## 2024-10-15 DIAGNOSIS — L089 Local infection of the skin and subcutaneous tissue, unspecified: Secondary | ICD-10-CM | POA: Diagnosis not present

## 2024-10-15 DIAGNOSIS — N179 Acute kidney failure, unspecified: Secondary | ICD-10-CM | POA: Diagnosis present

## 2024-10-15 DIAGNOSIS — M86172 Other acute osteomyelitis, left ankle and foot: Secondary | ICD-10-CM | POA: Diagnosis not present

## 2024-10-15 DIAGNOSIS — Z89412 Acquired absence of left great toe: Secondary | ICD-10-CM | POA: Diagnosis not present

## 2024-10-15 DIAGNOSIS — F1490 Cocaine use, unspecified, uncomplicated: Secondary | ICD-10-CM | POA: Diagnosis present

## 2024-10-15 DIAGNOSIS — E1142 Type 2 diabetes mellitus with diabetic polyneuropathy: Secondary | ICD-10-CM | POA: Diagnosis present

## 2024-10-15 DIAGNOSIS — D509 Iron deficiency anemia, unspecified: Secondary | ICD-10-CM | POA: Diagnosis present

## 2024-10-15 DIAGNOSIS — Z79899 Other long term (current) drug therapy: Secondary | ICD-10-CM

## 2024-10-15 DIAGNOSIS — E669 Obesity, unspecified: Secondary | ICD-10-CM | POA: Diagnosis present

## 2024-10-15 DIAGNOSIS — Z794 Long term (current) use of insulin: Secondary | ICD-10-CM | POA: Diagnosis not present

## 2024-10-15 DIAGNOSIS — M869 Osteomyelitis, unspecified: Secondary | ICD-10-CM

## 2024-10-15 DIAGNOSIS — Z7151 Drug abuse counseling and surveillance of drug abuser: Secondary | ICD-10-CM | POA: Diagnosis not present

## 2024-10-15 DIAGNOSIS — E11628 Type 2 diabetes mellitus with other skin complications: Secondary | ICD-10-CM | POA: Diagnosis not present

## 2024-10-15 DIAGNOSIS — N1831 Chronic kidney disease, stage 3a: Secondary | ICD-10-CM | POA: Diagnosis present

## 2024-10-15 DIAGNOSIS — E11621 Type 2 diabetes mellitus with foot ulcer: Secondary | ICD-10-CM | POA: Diagnosis present

## 2024-10-15 DIAGNOSIS — I129 Hypertensive chronic kidney disease with stage 1 through stage 4 chronic kidney disease, or unspecified chronic kidney disease: Secondary | ICD-10-CM | POA: Diagnosis present

## 2024-10-15 DIAGNOSIS — M25561 Pain in right knee: Secondary | ICD-10-CM | POA: Diagnosis not present

## 2024-10-15 DIAGNOSIS — Z89422 Acquired absence of other left toe(s): Secondary | ICD-10-CM | POA: Diagnosis not present

## 2024-10-15 DIAGNOSIS — Z89411 Acquired absence of right great toe: Secondary | ICD-10-CM

## 2024-10-15 DIAGNOSIS — L03116 Cellulitis of left lower limb: Principal | ICD-10-CM

## 2024-10-15 DIAGNOSIS — Z89421 Acquired absence of other right toe(s): Secondary | ICD-10-CM

## 2024-10-15 LAB — GLUCOSE, CAPILLARY
Glucose-Capillary: 323 mg/dL — ABNORMAL HIGH (ref 70–99)
Glucose-Capillary: 251 mg/dL — ABNORMAL HIGH (ref 70–99)

## 2024-10-15 LAB — CBC WITH DIFFERENTIAL/PLATELET
Abs Immature Granulocytes: 0.04 K/uL (ref 0.00–0.07)
Basophils Absolute: 0 K/uL (ref 0.0–0.1)
Basophils Relative: 0 %
Eosinophils Absolute: 0.1 K/uL (ref 0.0–0.5)
Eosinophils Relative: 1 %
HCT: 29.3 % — ABNORMAL LOW (ref 39.0–52.0)
Hemoglobin: 8.8 g/dL — ABNORMAL LOW (ref 13.0–17.0)
Immature Granulocytes: 0 %
Lymphocytes Relative: 17 %
Lymphs Abs: 1.7 K/uL (ref 0.7–4.0)
MCH: 24.2 pg — ABNORMAL LOW (ref 26.0–34.0)
MCHC: 30 g/dL (ref 30.0–36.0)
MCV: 80.7 fL (ref 80.0–100.0)
Monocytes Absolute: 0.6 K/uL (ref 0.1–1.0)
Monocytes Relative: 6 %
Neutro Abs: 7.6 K/uL (ref 1.7–7.7)
Neutrophils Relative %: 76 %
Platelets: 638 K/uL — ABNORMAL HIGH (ref 150–400)
RBC: 3.63 MIL/uL — ABNORMAL LOW (ref 4.22–5.81)
RDW: 14.7 % (ref 11.5–15.5)
WBC: 10 K/uL (ref 4.0–10.5)
nRBC: 0 % (ref 0.0–0.2)

## 2024-10-15 LAB — COMPREHENSIVE METABOLIC PANEL WITH GFR
ALT: <5 U/L (ref 0–44)
AST: <10 U/L — ABNORMAL LOW (ref 15–41)
Albumin: 3 g/dL — ABNORMAL LOW (ref 3.5–5.0)
Alkaline Phosphatase: 78 U/L (ref 38–126)
Anion gap: 8 (ref 5–15)
BUN: 20 mg/dL (ref 6–20)
CO2: 27 mmol/L (ref 22–32)
Calcium: 9 mg/dL (ref 8.9–10.3)
Chloride: 96 mmol/L — ABNORMAL LOW (ref 98–111)
Creatinine, Ser: 1.28 mg/dL — ABNORMAL HIGH (ref 0.61–1.24)
GFR, Estimated: >60 mL/min (ref >60)
Glucose, Bld: 424 mg/dL — ABNORMAL HIGH (ref 70–99)
Potassium: 5.3 mmol/L — ABNORMAL HIGH (ref 3.5–5.1)
Sodium: 132 mmol/L — ABNORMAL LOW (ref 135–145)
Total Bilirubin: 0.2 mg/dL (ref 0.0–1.2)
Total Protein: 8.1 g/dL (ref 6.5–8.1)

## 2024-10-15 LAB — BASIC METABOLIC PANEL WITH GFR
Anion gap: 9 (ref 5–15)
BUN: 21 mg/dL — ABNORMAL HIGH (ref 6–20)
CO2: 28 mmol/L (ref 22–32)
Calcium: 8.8 mg/dL — ABNORMAL LOW (ref 8.9–10.3)
Chloride: 99 mmol/L (ref 98–111)
Creatinine, Ser: 1.54 mg/dL — ABNORMAL HIGH (ref 0.61–1.24)
GFR, Estimated: 52 mL/min — ABNORMAL LOW (ref >60)
Glucose, Bld: 292 mg/dL — ABNORMAL HIGH (ref 70–99)
Potassium: 4.6 mmol/L (ref 3.5–5.1)
Sodium: 136 mmol/L (ref 135–145)

## 2024-10-15 LAB — BODY FLUID CULTURE W GRAM STAIN: Culture: NO GROWTH

## 2024-10-15 MED ORDER — ACETAMINOPHEN 325 MG PO TABS
650.0000 mg | ORAL_TABLET | Freq: Four times a day (QID) | ORAL | Status: DC | PRN
Start: 2024-10-15 — End: 2024-10-15

## 2024-10-15 MED ORDER — POLYSACCHARIDE IRON COMPLEX 150 MG PO CAPS
150.0000 mg | ORAL_CAPSULE | Freq: Every day | ORAL | Status: DC
  Administered 2024-10-16 – 2024-10-23 (×7): 150 mg via ORAL
  Filled 2024-10-15 (×9): qty 1

## 2024-10-15 MED ORDER — SENNA 8.6 MG PO TABS
1.0000 | ORAL_TABLET | Freq: Two times a day (BID) | ORAL | Status: DC
  Administered 2024-10-15 – 2024-10-23 (×13): 8.6 mg via ORAL
  Filled 2024-10-15 (×15): qty 1

## 2024-10-15 MED ORDER — INSULIN ASPART 100 UNIT/ML IJ SOLN
10.0000 [IU] | Freq: Once | INTRAMUSCULAR | Status: AC
  Administered 2024-10-15: 10 [IU] via INTRAVENOUS
  Filled 2024-10-15: qty 10

## 2024-10-15 MED ORDER — INSULIN GLARGINE-YFGN 100 UNIT/ML ~~LOC~~ SOLN
25.0000 [IU] | Freq: Every day | SUBCUTANEOUS | Status: DC
  Administered 2024-10-15: 25 [IU] via SUBCUTANEOUS
  Filled 2024-10-15: qty 0.25

## 2024-10-15 MED ORDER — HYDROMORPHONE HCL 1 MG/ML IJ SOLN
0.5000 mg | INTRAMUSCULAR | Status: DC | PRN
  Administered 2024-10-16 – 2024-10-19 (×10): 1 mg via INTRAVENOUS
  Filled 2024-10-15 (×11): qty 1

## 2024-10-15 MED ORDER — ACETAMINOPHEN 650 MG RE SUPP: 650.0000 mg | Freq: Four times a day (QID) | RECTAL | Status: DC | PRN

## 2024-10-15 MED ORDER — HEPARIN SODIUM (PORCINE) 5000 UNIT/ML IJ SOLN
5000.0000 [IU] | Freq: Three times a day (TID) | INTRAMUSCULAR | Status: DC
  Administered 2024-10-15 – 2024-10-23 (×23): 5000 [IU] via SUBCUTANEOUS
  Filled 2024-10-15 (×23): qty 1

## 2024-10-15 MED ORDER — MORPHINE SULFATE (PF) 4 MG/ML IV SOLN
4.0000 mg | Freq: Once | INTRAVENOUS | Status: AC
  Administered 2024-10-15: 4 mg via INTRAVENOUS
  Filled 2024-10-15: qty 1

## 2024-10-15 MED ORDER — SODIUM CHLORIDE 0.9 % IV BOLUS
1000.0000 mL | Freq: Once | INTRAVENOUS | Status: AC
  Administered 2024-10-15: 1000 mL via INTRAVENOUS

## 2024-10-15 MED ORDER — VANCOMYCIN HCL IN DEXTROSE 1-5 GM/200ML-% IV SOLN
1000.0000 mg | Freq: Once | INTRAVENOUS | Status: AC
  Administered 2024-10-15: 1000 mg via INTRAVENOUS
  Filled 2024-10-15: qty 200

## 2024-10-15 MED ORDER — OXYCODONE HCL 5 MG PO TABS
5.0000 mg | ORAL_TABLET | ORAL | Status: DC | PRN
  Administered 2024-10-15 – 2024-10-18 (×6): 5 mg via ORAL
  Filled 2024-10-15 (×6): qty 1

## 2024-10-15 MED ORDER — KETOROLAC TROMETHAMINE 15 MG/ML IJ SOLN
15.0000 mg | Freq: Once | INTRAMUSCULAR | Status: AC
  Administered 2024-10-15: 15 mg via INTRAVENOUS
  Filled 2024-10-15: qty 1

## 2024-10-15 MED ORDER — ACETAMINOPHEN 325 MG PO TABS
650.0000 mg | ORAL_TABLET | Freq: Four times a day (QID) | ORAL | Status: DC
  Administered 2024-10-15 – 2024-10-16 (×4): 650 mg via ORAL
  Filled 2024-10-15 (×4): qty 2

## 2024-10-15 MED ORDER — DICLOFENAC SODIUM 1 % EX GEL
2.0000 g | Freq: Four times a day (QID) | CUTANEOUS | Status: DC
  Administered 2024-10-15 – 2024-10-22 (×27): 2 g via TOPICAL
  Filled 2024-10-15 (×3): qty 100

## 2024-10-15 MED ORDER — INSULIN ASPART 100 UNIT/ML IJ SOLN
0.0000 [IU] | Freq: Three times a day (TID) | INTRAMUSCULAR | Status: DC
  Administered 2024-10-15: 7 [IU] via SUBCUTANEOUS
  Administered 2024-10-16: 5 [IU] via SUBCUTANEOUS
  Administered 2024-10-16 (×2): 3 [IU] via SUBCUTANEOUS
  Administered 2024-10-17: 2 [IU] via SUBCUTANEOUS
  Administered 2024-10-18: 9 [IU] via SUBCUTANEOUS
  Administered 2024-10-18: 7 [IU] via SUBCUTANEOUS
  Administered 2024-10-18: 9 [IU] via SUBCUTANEOUS
  Administered 2024-10-19: 5 [IU] via SUBCUTANEOUS
  Administered 2024-10-19: 2 [IU] via SUBCUTANEOUS
  Administered 2024-10-20: 1 [IU] via SUBCUTANEOUS
  Administered 2024-10-20: 2 [IU] via SUBCUTANEOUS
  Administered 2024-10-20: 3 [IU] via SUBCUTANEOUS
  Administered 2024-10-21 (×2): 2 [IU] via SUBCUTANEOUS
  Administered 2024-10-21: 1 [IU] via SUBCUTANEOUS
  Administered 2024-10-22 – 2024-10-23 (×2): 2 [IU] via SUBCUTANEOUS
  Administered 2024-10-23 (×2): 3 [IU] via SUBCUTANEOUS
  Filled 2024-10-15: qty 5
  Filled 2024-10-15: qty 1
  Filled 2024-10-15: qty 2
  Filled 2024-10-15: qty 3
  Filled 2024-10-15: qty 2
  Filled 2024-10-15: qty 9
  Filled 2024-10-15: qty 1
  Filled 2024-10-15 (×2): qty 3
  Filled 2024-10-15: qty 2
  Filled 2024-10-15: qty 5
  Filled 2024-10-15: qty 2
  Filled 2024-10-15: qty 3
  Filled 2024-10-15: qty 2
  Filled 2024-10-15: qty 7
  Filled 2024-10-15: qty 3
  Filled 2024-10-15: qty 9
  Filled 2024-10-15 (×2): qty 2
  Filled 2024-10-15: qty 7

## 2024-10-15 MED ORDER — ONDANSETRON HCL 4 MG/2ML IJ SOLN
4.0000 mg | Freq: Once | INTRAMUSCULAR | Status: AC
  Administered 2024-10-15: 4 mg via INTRAVENOUS
  Filled 2024-10-15: qty 2

## 2024-10-15 MED ORDER — GABAPENTIN 300 MG PO CAPS
300.0000 mg | ORAL_CAPSULE | Freq: Two times a day (BID) | ORAL | Status: DC
  Administered 2024-10-15 – 2024-10-23 (×15): 300 mg via ORAL
  Filled 2024-10-15 (×16): qty 1

## 2024-10-15 MED ORDER — POLYETHYLENE GLYCOL 3350 17 G PO PACK
17.0000 g | PACK | Freq: Every day | ORAL | Status: DC
  Administered 2024-10-15 – 2024-10-19 (×4): 17 g via ORAL
  Filled 2024-10-15 (×4): qty 1

## 2024-10-15 NOTE — Consult Note (Signed)
 PODIATRIC SURGERY: CONSULT NOTE  Reason for Consult: L foot wounds   HPI: [name] is a 58 y.o. male with PMH significant DM2 c/b polyneuropathy (diabetic ulcerations s/p R TMA), CKD3a, HLD, HTN and cocaine abuse presenting to ED for admission for worsening chronic LEFT foot wounds and R knee pain.  *Patient is currently seen by partner in outpatient setting where noted progression of chronic infection to the left foot was suspected and worsening/ acute changes noted to the second the wound / tract.   At his appointment on 11/10 with podiatry, he was recommended to present to ED for admission and surgical intervention of foot given progression of acute on chronic changes. Patient was unable to go to the ED at that time but presented later - which he endorsed to provider on encounter today that he presented only secondary to his inability to control his acute R knee pain, but that he would like his left foot managed as discussed in outpatient setting.   Currently denies systemic s/sx of infection.    PMHx:  Past Medical History:  Diagnosis Date   Amputation of right great toe    Cellulitis and abscess of foot    CKD stage 3a, GFR 45-59 ml/min (HCC)    Cocaine abuse (HCC)    + UDS on 05-12-24   DM (diabetes mellitus), type 2 (HCC)    ESBL (extended spectrum beta-lactamase) producing bacteria infection 04/18/2024   Hyperlipidemia    Hypertension    Methicillin resistant Staphylococcus aureus infection 07/14/2024   MRSA bacteremia    Normocytic anemia    Obesity    Tobacco abuse    Toe osteomyelitis, left (HCC)    Tuberculosis    tested positive,treated with medications.    Surgical Hx:  Past Surgical History:  Procedure Laterality Date   AMPUTATION TOE Right 03/26/2024   Procedure: AMPUTATION, TOE;  Surgeon: Harden Jerona GAILS, MD;  Location: [facility] OR;  Service: Orthopedics;  Laterality: Right;  RIGHT GREAT TOE AMPUTATION   BONE BIOPSY  04/18/2024   Procedure: BIOPSY, BONE (2ND  METATARSAL);  Surgeon: Ashley Soulier, DPM;  Location: [facility] ORS;  Service: Orthopedics/Podiatry;;   FOOT SURGERY Left    I & D EXTREMITY Left 05/05/2022   Procedure: LEFT FOOT DEBRIDEMENT;  Surgeon: Harden Jerona GAILS, MD;  Location: [facility] OR;  Service: Orthopedics;  Laterality: Left;   INCISION AND DRAINAGE OF WOUND Right 06/05/2024   Procedure: DELAYED PRIMARY CLOSURE OF RIGHT FOOT WOUND, EXCISION OF DISTAL METATARSAL 2, 3, 4, and 5;  Surgeon: Ashley Soulier, DPM;  Location: [facility] ORS;  Service: Orthopedics/Podiatry;  Laterality: Right;   IRRIGATION AND DEBRIDEMENT FOOT Right 04/18/2024   Procedure: IRRIGATION AND DEBRIDEMENT FOOT, PLACEMENT OF ANTIBIOTIC BEADS;  Surgeon: Ashley Soulier, DPM;  Location: [facility] ORS;  Service: Orthopedics/Podiatry;  Laterality: Right;   IRRIGATION AND DEBRIDEMENT FOOT Left 06/29/2024   Procedure: IRRIGATION AND DEBRIDEMENT FOOT;  Surgeon: Lennie Barter, DPM;  Location: [facility] ORS;  Service: Orthopedics/Podiatry;  Laterality: Left;   MYRINGOTOMY WITH TUBE PLACEMENT Bilateral 07/20/2022   Procedure: MYRINGOTOMY WITH TUBE PLACEMENT;  Surgeon: Edda Mt, MD;  Location: [facility];  Service: ENT;  Laterality: Bilateral;  Diabetic   TEE WITHOUT CARDIOVERSION N/A 07/02/2024   Procedure: ECHOCARDIOGRAM, TRANSESOPHAGEAL;  Surgeon: Perla Evalene PARAS, MD;  Location: [facility] ORS;  Service: Cardiovascular;  Laterality: N/A;   TONSILLECTOMY     TRANSMETATARSAL AMPUTATION Right 04/23/2024   Procedure: AMPUTATION, FOOT, TRANSMETATARSAL;  Surgeon: Ashley Soulier, DPM;  Location: [facility] ORS;  Service:  Orthopedics/Podiatry;  Laterality: Right;   TRANSMETATARSAL AMPUTATION Right 06/29/2024   Procedure: AMPUTATION, FOOT, TRANSMETATARSAL;  Surgeon: Lennie Barter, DPM;  Location: [facility] ORS;  Service: Orthopedics/Podiatry;  Laterality: Right;  RIGHT TRANSMETATARSAL AMPUTATION REVISION    FHx:  Family History  Problem Relation Age of Onset   Colon cancer Neg Hx    Colon polyps Neg Hx     Crohn's disease Neg Hx    Esophageal cancer Neg Hx    Rectal cancer Neg Hx    Stomach cancer Neg Hx    Ulcerative colitis Neg Hx     Social History:  reports that he quit smoking about 2 years ago. His smoking use included cigarettes. He started smoking about 27 years ago. He has a 6.3 pack-year smoking history. He has never used smokeless tobacco. He reports that he does not drink alcohol and does not use drugs.  Allergies: No Known Allergies  Facility-Administered Medications Prior to Admission  Medication Dose Route Frequency Provider Last Rate Last Admin   diclofenac Sodium (VOLTAREN) 1 % topical gel 2 g  2 g Topical BID        tolnaftate (TINACTIN) 1 % external solution   Topical BID        Medications Prior to Admission  Medication Sig Dispense Refill   amoxicillin -clavulanate (AUGMENTIN ) 875-125 MG tablet Take 875 mg by mouth 2 (two) times daily.  Take 1 tablet (875 mg total) by mouth every 12 (twelve) hours for 7 days     bisacodyl  (DULCOLAX) 5 MG EC tablet Take 2 tablets (10 mg total) by mouth at bedtime as needed for moderate constipation. 30 tablet 0   Cholecalciferol  (VITAMIN D3) 1.25 MG (50000 UT) CAPS Take 1 capsule (50,000 Units total) by mouth every Monday. 4 capsule 0   clindamycin (CLEOCIN) 300 MG capsule Take 300 mg by mouth 4 (four) times daily.     gabapentin  (NEURONTIN ) 300 MG capsule Take 1 capsule (300 mg total) by mouth 2 (two) times daily for neuropathy pain. 60 capsule 0   HYDROcodone -acetaminophen  (NORCO/VICODIN) 5-325 MG tablet Take 1 tablet by mouth every 6 (six) hours as needed.     insulin  aspart (NOVOLOG ) 100 UNIT/ML FlexPen Inject 0-15 Units into the skin 3 (three) times daily before meals.  Correction coverage: Moderate (average weight, post-op) CBG 70 - 120: 0 units CBG 121 - 150: 2 units CBG 151 - 200: 3 units CBG 201 - 250: 5 units CBG 251 - 300: 8 units CBG 301 - 350: 11 units CBG 351 - 400: 15 units CBG > 400: call MD 15 mL 11   Insulin  NPH,  Human,, Isophane, (HUMULIN  N KWIKPEN) 100 UNIT/ML Kiwkpen Inject 22 Units into the skin 2 (two) times daily. 15 mL 0   losartan  (COZAAR ) 25 MG tablet Take 0.5 tablets (12.5 mg total) by mouth daily.     metFORMIN  (GLUCOPHAGE -XR) 500 MG 24 hr tablet Take 2 tablets (1,000 mg total) by mouth 2 (two) times daily with a meal. 120 tablet 0   naloxone (NARCAN) nasal spray 4 mg/0.1 mL Place 0.4 mg into the nose once.     oxyCODONE -acetaminophen  (PERCOCET/ROXICET) 5-325 MG tablet Take 1 tablet by mouth every 8 (eight) hours as needed for severe pain (pain score 7-10). 6 tablet 0   polyethylene glycol powder (GLYCOLAX /MIRALAX ) 17 GM/SCOOP powder Take 17 g by mouth 2 (two) times daily. Mix as directed. (Patient taking differently: Take 17 g by mouth 2 (two) times daily as needed for mild constipation. Mix  as directed.) 238 g 0   sulfamethoxazole-trimethoprim (BACTRIM DS) 800-160 MG tablet Take 1 tablet by mouth 2 (two) times daily.     Accu-Chek Softclix Lancets lancets Use to check blood sugar 3 times daily. 100 each 6   Blood Glucose Monitoring Suppl (ACCU-CHEK GUIDE) w/Device KIT Use to check blood sugar 3 times daily. 1 kit 0   chlorhexidine  (HIBICLENS ) 4 % external liquid Apply 15 mLs (1 Application total) topically as directed for 30 doses. Use as directed daily for 5 days every other week for 6 weeks. (Patient not taking: Reported on 10/15/2024) 946 mL 1   Continuous Blood Gluc Transmit (DEXCOM G6 TRANSMITTER) MISC Use to check blood sugar three times daily. Change transmitter once every 909 days. E11.69 1 each 1   glucose blood (ACCU-CHEK GUIDE TEST) test strip Use to check blood sugar 3 times daily. 100 each 6   Insulin  Pen Needle (TECHLITE PEN NEEDLES) 32G X 4 MM MISC Use as directed to inject insulin  up to 4 times daily. 100 each 0   iron  polysaccharides (NIFEREX) 150 MG capsule Take 1 capsule (150 mg total) by mouth daily. (Patient not taking: Reported on 10/15/2024) 90 capsule 0    Physical  Exam: Blood pressure (!) 116/53, pulse 89, temperature 98.5 F (36.9 C), resp. rate 18, height 5' 9 (1.753 m), weight 104.3 kg, SpO2 98%.  Constitutional: Patient appears of normal development and good nutritional status for stated age, well-groomed, and of normal body habitus. Phonating appropriately.   Psychiatric: Alert and oriented to time, place, person and situation. The patient is noted to have good judgment and insight into the hospital visit  FOCUSED LOWER EXTREMITY EXAMINATION:   Neurological:  - Protective sensation diminished / absent - Gross protective sensation diminished bilaterally.  - No focal motor or sensory deficits identified bilaterally.   Vascular: LEFT - Dorsalis Pedis artery on palpation: 2 - Posterior Tibial artery on palpation: 1 - Capillary Filling Times: sluggish - Peripheral edema: localized to distal L forefoot  Musculoskeletal:  Muscle strength: 5/5 in all 4 quadrants  Ankle Joint ROM: decreased, equinus noted Global foot - TTP: none noted.  Dermatological:  R TMA site appears well coapted, no openings   L plantar first/2nd MTPJ ulcer - wound measures 4 x 2 x 3cm  Bed appears to be granular after debridement, probes very deep at the distal portion near the 2nd MTPJ, close to bone.   L plantar lateral midfoot ulcer 1 x 1.2 x 0.5 cm, no SOI, wound bed appears fibrous. No obvious bone exposed.   L 2nd toe medial base, flexor tendon exposed, foul [name], no erythema or edema increased to this area, serous drainage present, measures 1.5 x 2 x 1.5cm, probes close to bone and tunnels along the flexor tendon.          Results for orders placed or performed during the hospital encounter of 10/15/24 (from the past 48 hours)  Comprehensive metabolic panel     Status: Abnormal   Collection Time: 10/15/24  1:06 PM  Result Value Ref Range   Sodium 132 (L) 135 - 145 mmol/L   Potassium 5.3 (H) 3.5 - 5.1 mmol/L   Chloride 96 (L) 98 - 111 mmol/L   CO2 27  22 - 32 mmol/L   Glucose, Bld 424 (H) 70 - 99 mg/dL    Comment: Glucose reference range applies only to samples taken after fasting for at least 8 hours.   BUN 20 6 - 20 mg/dL  Creatinine, Ser 1.28 (H) 0.61 - 1.24 mg/dL   Calcium  9.0 8.9 - 10.3 mg/dL   Total Protein 8.1 6.5 - 8.1 g/dL   Albumin 3.0 (L) 3.5 - 5.0 g/dL   AST <89 (L) 15 - 41 U/L   ALT <5 0 - 44 U/L   Alkaline Phosphatase 78 38 - 126 U/L   Total Bilirubin 0.2 0.0 - 1.2 mg/dL   GFR, Estimated >39 >39 mL/min    Comment: (NOTE) Calculated using the CKD-EPI Creatinine Equation (2021)    Anion gap 8 5 - 15    Comment: Performed at [facility], [address] [place], [place] [address]  CBC with Differential     Status: Abnormal   Collection Time: 10/15/24  1:06 PM  Result Value Ref Range   WBC 10.0 4.0 - 10.5 K/uL   RBC 3.63 (L) 4.22 - 5.81 MIL/uL   Hemoglobin 8.8 (L) 13.0 - 17.0 g/dL   HCT 70.6 (L) 60.9 - 47.9 %   MCV 80.7 80.0 - 100.0 fL   MCH 24.2 (L) 26.0 - 34.0 pg   MCHC 30.0 30.0 - 36.0 g/dL   RDW 85.2 88.4 - 84.4 %   Platelets 638 (H) 150 - 400 K/uL   nRBC 0.0 0.0 - 0.2 %   Neutrophils Relative % 76 %   Neutro Abs 7.6 1.7 - 7.7 K/uL   Lymphocytes Relative 17 %   Lymphs Abs 1.7 0.7 - 4.0 K/uL   Monocytes Relative 6 %   Monocytes Absolute 0.6 0.1 - 1.0 K/uL   Eosinophils Relative 1 %   Eosinophils Absolute 0.1 0.0 - 0.5 K/uL   Basophils Relative 0 %   Basophils Absolute 0.0 0.0 - 0.1 K/uL   Immature Granulocytes 0 %   Abs Immature Granulocytes 0.04 0.00 - 0.07 K/uL    Comment: Performed at [facility], [address] [place], [place] [address]  Glucose, capillary     Status: Abnormal   Collection Time: 10/15/24  5:41 PM  Result Value Ref Range   Glucose-Capillary 323 (H) 70 - 99 mg/dL    Comment: Glucose reference range applies only to samples taken after fasting for at least 8 hours.  Basic metabolic panel with GFR     Status: Abnormal   Collection Time: 10/15/24  6:27 PM   Result Value Ref Range   Sodium 136 135 - 145 mmol/L   Potassium 4.6 3.5 - 5.1 mmol/L   Chloride 99 98 - 111 mmol/L   CO2 28 22 - 32 mmol/L   Glucose, Bld 292 (H) 70 - 99 mg/dL    Comment: Glucose reference range applies only to samples taken after fasting for at least 8 hours.   BUN 21 (H) 6 - 20 mg/dL   Creatinine, Ser 8.45 (H) 0.61 - 1.24 mg/dL   Calcium  8.8 (L) 8.9 - 10.3 mg/dL   GFR, Estimated 52 (L) >60 mL/min    Comment: (NOTE) Calculated using the CKD-EPI Creatinine Equation (2021)    Anion gap 9 5 - 15    Comment: Performed at [facility], [address] [place], [place] [address]  Glucose, capillary     Status: Abnormal   Collection Time: 10/15/24  8:52 PM  Result Value Ref Range   Glucose-Capillary 251 (H) 70 - 99 mg/dL    Comment: Glucose reference range applies only to samples taken after fasting for at least 8 hours.   DG Foot 2 Views Left Result Date: 10/15/2024 CLINICAL  DATA:  Infection EXAM: LEFT FOOT - 2 VIEW COMPARISON:  Left foot x-ray 09/26/2024 FINDINGS: There is soft tissue swelling of the anterior foot and toes. No radiopaque foreign body identified. There are increasing lucencies within the distal second metatarsal. Chronic deformity of the distal fifth metatarsal with periosteal reaction appears unchanged. Difficult to evaluate the third and fourth metatarsophalangeal joint secondary to patient positioning. Plantar calcaneal spur identified. No acute fracture or dislocation identified. IMPRESSION: 1. Increasing lucencies within the distal second metatarsal concerning for osteomyelitis. 2. Chronic deformity of the distal fifth metatarsal with periosteal reaction appears unchanged. 3. Soft tissue swelling of the anterior foot and toes. Electronically Signed   By: Greig Pique M.D.   On: 10/15/2024 17:15     Imaging:  Exam Status  Status  Final [99]   PACS Intelerad Image Link   Show images for DG Foot 2 Views Left Study Result  Narrative &  Impression  CLINICAL DATA:  Infection   EXAM: LEFT FOOT - 2 VIEW   COMPARISON:  Left foot x-ray 09/26/2024   FINDINGS: There is soft tissue swelling of the anterior foot and toes. No radiopaque foreign body identified. There are increasing lucencies within the distal second metatarsal. Chronic deformity of the distal fifth metatarsal with periosteal reaction appears unchanged. Difficult to evaluate the third and fourth metatarsophalangeal joint secondary to patient positioning. Plantar calcaneal spur identified. No acute fracture or dislocation identified.   IMPRESSION: 1. Increasing lucencies within the distal second metatarsal concerning for osteomyelitis. 2. Chronic deformity of the distal fifth metatarsal with periosteal reaction appears unchanged. 3. Soft tissue swelling of the anterior foot and toes.     Electronically Signed   By: Greig Pique M.D.   On: 10/15/2024 17:15    Assessment  - Acute on chronic osteomyelitis, left foot.  - Diabetic foot infection, left foot   Plan for transmetatarsal amputation with possible wound vac application on the left foot 10/17/2024. Would appreciate MRI of L foot for eval if OM has progressed further (order placed 10/16/2024). Primary closure is complicated by multiple soft tissue defects / poor tissue to the plantar foot and may require rotational flap for closure if primary closure can be achieved.   We discussed in detail the treatment options available at this juncture - both conservative and surgical in nature. Given that the patient has undergone conservative treatment attempts and appears recalcitrant to such measures given progression of - surgical options were thoroughly discussed at todays encounter, specifically intervention in form of transmetatarsal amputation of the left foot with possible rotational flap closure versus wound vac application. The procedure, alternatives, risks, and limitations in this individual case have  been carefully discussed with the patient. All questions have been answered to patient satisfaction and the patient understands the surgery indicated. No guarantees or promises on surgical outcome were made and patient understands that surgical intervention may not fully resolve the current condition or may lead to other complications perioperatively. After PARQ discussion was held, the patient has requested that this surgical intervention be undertaken and elected to proceed with case request submission.  Patient had multiple requests he wanted addressed this hospitalization as documented below:  #LEFT FOOT  - Dispo - Would like to go to ARU / SNF on discharge for rehabilitation. Prefers [facility]. Discussed this with provider to 'get the ball rolling' so requests can be made for bed availability. Patient has family members in this area and better support network.   #RIGHT KNEE - Requested  orthopedic knee consultation. Missed his outpatient appointment and requests acute eval.  - Would like pain medication. Patient reports that percocet alleviated pain, norco mildly. Reports he has been using APAP consistently for the month his knee has been hurting without any noted benefit - and relays he reported this on admission but that he continues to only receive APAP. Would like to request something stronger during admission.  Plan - Case request placed. - ABX: Appreciate Medicine / ID / Pharmacy assist with antibiotic stewardship and appropriate coverage.  - Diet: 'NPO at midnight' - liquid breakfast in AM - NPO starting 8am. - Activity: NWB to the RIGHT lower extremity.   - Wound Care: pending OR intervention. Okay to conduct betadine  + gauze dressings in interim. Dressings conducted at bedside with podiatry today 10/16/2024  [name] [name] 10/15/2024, 11:58 PM

## 2024-10-15 NOTE — ED Provider Notes (Signed)
 [facility] Provider Note    Event Date/Time   First MD Initiated Contact with Patient 10/15/24 1408     (approximate)  History   Chief Complaint: Foot Pain and Knee Pain  HPI  [name] is a 58 y.o. male with a past medical history of prior right distal foot amputation, hypertension, hyperlipidemia, presents to the emergency department for worsening infection of the left foot as well as right knee pain.  According to the patient over the past month he has been dealing with right knee pain and swelling.  He is hide come to the emergency department multiple times for right knee drainage.  I have reviewed the results of the prior synovial fluid analysis which appears most consistent with inflammatory changes such as arthritis but no signs of gout or infection on the prior fluid.  Patient states on Monday he went to see his podiatrist [name] for a worsening left foot ulceration/infection that he has been dealing with for 3 months.  He was told that this will need a distal foot amputation and to go to the emergency department.  Patient was unable to come yesterday so he came today for admission for the amputation.  Physical Exam   Triage Vital Signs: ED Triage Vitals [10/15/24 1304]  Encounter Vitals Group     BP 106/69     Girls Systolic BP Percentile      Girls Diastolic BP Percentile      Boys Systolic BP Percentile      Boys Diastolic BP Percentile      Pulse Rate 98     Resp 17     Temp 98.9 F (37.2 C)     Temp Source Oral     SpO2 98 %     Weight 230 lb (104.3 kg)     Height 5' 9 (1.753 m)     Head Circumference      Peak Flow      Pain Score 10     Pain Loc      Pain Education      Exclude from Growth Chart     Most recent vital signs: Vitals:   10/15/24 1304  BP: 106/69  Pulse: 98  Resp: 17  Temp: 98.9 F (37.2 C)  SpO2: 98%    General: Awake, no distress.  CV:  Good peripheral perfusion.  Regular rate and rhythm  Resp:  Normal  effort.  Equal breath sounds bilaterally.  Abd:  No distention.  Soft, nontender.  No rebound or guarding. Other:  Patient has moderately swollen right knee with tenderness to palpation.  Patient has an ulceration to the distal aspect of the plantar aspect of the left foot with an ulceration between the 1st and 2nd toes as well.   ED Results / Procedures / Treatments   MEDICATIONS ORDERED IN ED: Medications  vancomycin  (VANCOCIN ) IVPB 1000 mg/200 mL premix (has no administration in time range)  morphine  (PF) 4 MG/ML injection 4 mg (has no administration in time range)  ondansetron  (ZOFRAN ) injection 4 mg (has no administration in time range)     IMPRESSION / MDM / ASSESSMENT AND PLAN / ED COURSE  I reviewed the triage vital signs and the nursing notes.  Patient's presentation is most consistent with acute presentation with potential threat to life or bodily function.  Patient presents emergency department for admission for antibiotics and distal left foot amputation as recommended by his podiatrist [name].  As a secondary  complaint patient is also complaining of significant pain in his right knee that has been bothering him for over a month now.  Patient had an appointment tomorrow with orthopedics but due to his foot ended up coming to the emergency department.  Patient's lab work today shows a reassuring CBC white blood cell count of 10.  Patient's chemistry does show significant hyperglycemia 424 no other significant findings.  We will IV hydrate treat with insulin .  Will start the patient on broad-spectrum antibiotics such as vancomycin  send blood cultures.  Have sent a message to podiatry awaiting to hear back for any further imaging that may be needed.  We will dose pain medication while awaiting further results and admission.  FINAL CLINICAL IMPRESSION(S) / ED DIAGNOSES   Left foot infection Cellulitis Right knee pain  Note:  This document was prepared using Dragon voice  recognition software and may include unintentional dictation errors.   [name], MD 10/15/24 GENNIE

## 2024-10-15 NOTE — Hospital Course (Signed)
 L foot distal amputation.   R knee pain for a month, arthritic knee.

## 2024-10-15 NOTE — ED Triage Notes (Signed)
 Patient to ED via POV for admission for left foot, toes amputation. Pt also c/o right knee pain- seen for same on 11/8. States has appointment with ortho tomorrow but will miss it due to being admitted- still having pain in the knee.

## 2024-10-15 NOTE — H&P (Signed)
 History and Physical    Patient: [name] FMW:[id number] DOB: May 20, 1966 DOA: 10/15/2024 DOS: the patient was seen and examined on 10/15/2024 PCP: [name], MD  Patient coming from: Home  Chief Complaint:  Chief Complaint  Patient presents with   Foot Pain   Knee Pain   HPI: [name] is a 58 y.o. 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    Patient has had pain in his right knee for at least the past 3 weeks.  He has had some falls due to his knee giving out .  He presented to [facility] 10/24.  MRI obtained at that time was inconclusive for osteomyelitis.  An arthrocentesis was attempted but no synovial fluid was able to be collected 1024.  Infectious disease was consulted and had low suspicion for septic arthritis.   He followed up with infectious disease 10/29.  They recommended given patient's persistent pain and concern for possible septic joint and a follow-up with orthopedic team for arthrocentesis. He underwent arthrocentesis which showed no crystals and 24K WBC with neutrophilic predominance. He was scheduled to follow up 11/13 for further treatment.  However he continued to have pain in his right knee.  He presented to the ED 11/8.  Repeat arthrocentesis was performed.  Synovial fluid analysis again showed white blood cells in the 20,000 range.  Synovial fluid cultures were sent which are no growth to date.  He had no crystals. He was again discharged with pain medication.   Patient presents again with swelling and pain in his R knee. He has no systemic symptoms of infection. He denies dyspnea, cough, diarrhea, dysuria.    Of note, patient was seen by podiatry team 11/10.  It was recommended at that time that patient presented to the ED for further management of his chronic left foot ulcers.  Patient has no pain from  these.  During a previous hospitalization it was thought that patient had foul-smelling purulent drainage from these.  The left foot showed chronic osteomyelitis during hospitalization 10/24.  He has completed antibiotics for this since that time.   Review of Systems: As mentioned in the history of present illness. All other systems reviewed and are negative. Past Medical History:  Diagnosis Date   Amputation of right great toe    Cellulitis and abscess of foot    CKD stage 3a, GFR 45-59 ml/min (HCC)    Cocaine abuse (HCC)    + UDS on 05-12-24   DM (diabetes mellitus), type 2 (HCC)    ESBL (extended spectrum beta-lactamase) producing bacteria infection 04/18/2024   Hyperlipidemia    Hypertension    Methicillin resistant Staphylococcus aureus infection 07/14/2024   MRSA bacteremia    Normocytic anemia    Obesity    Tobacco abuse    Toe osteomyelitis, left (HCC)    Tuberculosis    tested positive,treated with medications.   Past Surgical History:  Procedure Laterality Date   AMPUTATION TOE Right 03/26/2024   Procedure: AMPUTATION, TOE;  Surgeon: Harden Jerona GAILS, MD;  Location: [facility] OR;  Service: Orthopedics;  Laterality: Right;  RIGHT GREAT TOE AMPUTATION   BONE BIOPSY  04/18/2024   Procedure: BIOPSY, BONE (2ND METATARSAL);  Surgeon: Ashley Soulier, DPM;  Location: [facility] ORS;  Service: Orthopedics/Podiatry;;   FOOT SURGERY Left    I & D EXTREMITY Left 05/05/2022  Procedure: LEFT FOOT DEBRIDEMENT;  Surgeon: Harden Jerona GAILS, MD;  Location: [facility] OR;  Service: Orthopedics;  Laterality: Left;   INCISION AND DRAINAGE OF WOUND Right 06/05/2024   Procedure: DELAYED PRIMARY CLOSURE OF RIGHT FOOT WOUND, EXCISION OF DISTAL METATARSAL 2, 3, 4, and 5;  Surgeon: Ashley Soulier, DPM;  Location: [facility] ORS;  Service: Orthopedics/Podiatry;  Laterality: Right;   IRRIGATION AND DEBRIDEMENT FOOT Right 04/18/2024   Procedure: IRRIGATION AND DEBRIDEMENT FOOT, PLACEMENT OF ANTIBIOTIC BEADS;  Surgeon: Ashley Soulier,  DPM;  Location: [facility] ORS;  Service: Orthopedics/Podiatry;  Laterality: Right;   IRRIGATION AND DEBRIDEMENT FOOT Left 06/29/2024   Procedure: IRRIGATION AND DEBRIDEMENT FOOT;  Surgeon: Lennie Barter, DPM;  Location: [facility] ORS;  Service: Orthopedics/Podiatry;  Laterality: Left;   MYRINGOTOMY WITH TUBE PLACEMENT Bilateral 07/20/2022   Procedure: MYRINGOTOMY WITH TUBE PLACEMENT;  Surgeon: Edda Mt, MD;  Location[facility];  Service: ENT;  Laterality: Bilateral;  Diabetic   TEE WITHOUT CARDIOVERSION N/A 07/02/2024   Procedure: ECHOCARDIOGRAM, TRANSESOPHAGEAL;  Surgeon: Perla Evalene PARAS, MD;  Location: [facility] ORS;  Service: Cardiovascular;  Laterality: N/A;   TONSILLECTOMY     TRANSMETATARSAL AMPUTATION Right 04/23/2024   Procedure: AMPUTATION, FOOT, TRANSMETATARSAL;  Surgeon: Ashley Soulier, DPM;  Location: [facility] ORS;  Service: Orthopedics/Podiatry;  Laterality: Right;   TRANSMETATARSAL AMPUTATION Right 06/29/2024   Procedure: AMPUTATION, FOOT, TRANSMETATARSAL;  Surgeon: Lennie Barter, DPM;  Location: [facility] ORS;  Service: Orthopedics/Podiatry;  Laterality: Right;  RIGHT TRANSMETATARSAL AMPUTATION REVISION   Social History:  reports that he quit smoking about 2 years ago. His smoking use included cigarettes. He started smoking about 27 years ago. He has a 6.3 pack-year smoking history. He has never used smokeless tobacco. He reports that he does not drink alcohol and does not use drugs.  No Known Allergies  Family History  Problem Relation Age of Onset   Colon cancer Neg Hx    Colon polyps Neg Hx    Crohn's disease Neg Hx    Esophageal cancer Neg Hx    Rectal cancer Neg Hx    Stomach cancer Neg Hx    Ulcerative colitis Neg Hx     Prior to Admission medications   Medication Sig Start Date End Date Taking? Authorizing Provider  Accu-Chek Softclix Lancets lancets Use to check blood sugar 3 times daily. 04/01/24   Newlin, Enobong, MD  bisacodyl  (DULCOLAX) 5 MG EC tablet Take 2 tablets (10  mg total) by mouth at bedtime as needed for moderate constipation. 04/29/24   Von Bellis, MD  Blood Glucose Monitoring Suppl (ACCU-CHEK GUIDE) w/Device KIT Use to check blood sugar 3 times daily. 04/01/24   Newlin, Enobong, MD  chlorhexidine  (HIBICLENS ) 4 % external liquid Apply 15 mLs (1 Application total) topically as directed for 30 doses. Use as directed daily for 5 days every other week for 6 weeks. 06/05/24   Ashley Soulier, DPM  Cholecalciferol  (VITAMIN D3) 1.25 MG (50000 UT) CAPS Take 1 capsule (50,000 Units total) by mouth every Monday. 08/11/24     Continuous Blood Gluc Transmit (DEXCOM G6 TRANSMITTER) MISC Use to check blood sugar three times daily. Change transmitter once every 909 days. E11.69 06/05/22   Newlin, Enobong, MD  gabapentin  (NEURONTIN ) 300 MG capsule Take 1 capsule (300 mg total) by mouth 2 (two) times daily for neuropathy pain. 08/05/24     glucose blood (ACCU-CHEK GUIDE TEST) test strip Use to check blood sugar 3 times daily. 04/01/24   Newlin, Enobong, MD  insulin  aspart (NOVOLOG ) 100 UNIT/ML  FlexPen Inject 0-15 Units into the skin 3 (three) times daily before meals.  Correction coverage: Moderate (average weight, post-op) CBG 70 - 120: 0 units CBG 121 - 150: 2 units CBG 151 - 200: 3 units CBG 201 - 250: 5 units CBG 251 - 300: 8 units CBG 301 - 350: 11 units CBG 351 - 400: 15 units CBG > 400: call MD 04/29/24   Von Bellis, MD  Insulin  NPH, Human,, Isophane, (HUMULIN  N KWIKPEN) 100 UNIT/ML Kiwkpen Inject 22 Units into the skin 2 (two) times daily. 08/05/24     Insulin  Pen Needle (TECHLITE PEN NEEDLES) 32G X 4 MM MISC Use as directed to inject insulin  up to 4 times daily. 08/11/24   Newlin, Enobong, MD  iron  polysaccharides (NIFEREX) 150 MG capsule Take 1 capsule (150 mg total) by mouth daily. 04/30/24 10/01/24  Von Bellis, MD  losartan  (COZAAR ) 25 MG tablet Take 0.5 tablets (12.5 mg total) by mouth daily. 09/29/24 09/29/25  Samtani, Jai-Gurmukh, MD  metFORMIN  (GLUCOPHAGE -XR)  500 MG 24 hr tablet Take 2 tablets (1,000 mg total) by mouth 2 (two) times daily with a meal. 08/05/24     oxyCODONE -acetaminophen  (PERCOCET/ROXICET) 5-325 MG tablet Take 1 tablet by mouth every 8 (eight) hours as needed for severe pain (pain score 7-10). 10/11/24   Charlyn Sora, MD  polyethylene glycol powder (GLYCOLAX /MIRALAX ) 17 GM/SCOOP powder Take 17 g by mouth 2 (two) times daily. Mix as directed. Patient taking differently: Take 17 g by mouth 2 (two) times daily as needed for mild constipation. Mix as directed. 04/29/24   Von Bellis, MD    Physical Exam: Vitals:   10/15/24 1304  BP: 106/69  Pulse: 98  Resp: 17  Temp: 98.9 F (37.2 C)  TempSrc: Oral  SpO2: 98%  Weight: 104.3 kg  Height: 5' 9 (1.753 m)   Physical Exam  Constitutional: In no distress.  Cardiovascular: Normal rate, regular rhythm. No lower extremity edema  Pulmonary: Non labored breathing on room air, no wheezing or rales.   Abdominal: Soft. Non distended and non tender Musculoskeletal: R knee no erythema of overlying skin, no increased WTT, effusion, R foot s/p TMA, L foot with two plantar ulcers and one interdigital ulcer          Media Information   Neurological: Alert and oriented to person, place, and time. Non focal  Skin: Skin is warm and dry.   Data Reviewed:     Latest Ref Rng & Units 10/15/2024    1:06 PM 09/29/2024    5:04 AM 09/28/2024    4:17 AM  BMP  Glucose 70 - 99 mg/dL 575  867  807   BUN 6 - 20 mg/dL 20  21  26    Creatinine 0.61 - 1.24 mg/dL 8.71  8.27  7.84   Sodium 135 - 145 mmol/L 132  135  132   Potassium 3.5 - 5.1 mmol/L 5.3  4.2  3.7   Chloride 98 - 111 mmol/L 96  102  99   CO2 22 - 32 mmol/L 27  24  23    Calcium  8.9 - 10.3 mg/dL 9.0  8.4  8.1       Latest Ref Rng & Units 10/15/2024    1:06 PM 10/01/2024    4:00 PM 09/29/2024    5:04 AM  CBC  WBC 4.0 - 10.5 K/uL 10.0  9.7  11.6   Hemoglobin 13.0 - 17.0 g/dL 8.8  8.5  9.2   Hematocrit 39.0 - 52.0 %  29.3  27.9   29.8   Platelets 150 - 400 K/uL 638  472  336      Assessment and Plan:  Non healing chronic L foot diabetic ulcer  Chronic osteomyelitis   Patient received a dose of vancomycin  in the ED.  He is afebrile.  Patient follows with podiatry outpatient for this and they recommended that he come for definitive management of his ulcers.  The ED team alerted the on-call podiatrist.  T2DM A1c 6.7 09/2024. Currently takes 22u of NPH twice daily as well as metformin .  Will start patient on 25u of semglee  inpatient.    HTN Normotensive. Will hold on resuming antihypertensives at this time.   AKI  Reported CKD. However serum creatinine improved and GFR >60 on labs.    Normocytic anemia  Iron  deficiency noted on labs ~9 months ago. Will need further work up for this outpatient.  -Will recheck iron  panel, folate, b12 -Continue PO iron    Hyperkalemia  5.3. Will recheck labs.   PseudohypoNA Due to elevated blood glucose.   HLD LDL 62 5 months ago.   Cocaine use   Advance Care Planning:   Code Status: Full Code Discussed with patient code status and he would like CPR as well as intubation if warranted.   Consults: Podiatry   Family Communication: NOne  Severity of Illness: The appropriate patient status for this patient is INPATIENT. Inpatient status is judged to be reasonable and necessary in order to provide the required intensity of service to ensure the patient's safety. The patient's presenting symptoms, physical exam findings, and initial radiographic and laboratory data in the context of their chronic comorbidities is felt to place them at high risk for further clinical deterioration. Furthermore, it is not anticipated that the patient will be medically stable for discharge from the hospital within 2 midnights of admission.   * I certify that at the point of admission it is my clinical judgment that the patient will require inpatient hospital care spanning beyond 2 midnights  from the point of admission due to high intensity of service, high risk for further deterioration and high frequency of surveillance required.*  Author: Alban Pepper, MD 10/15/2024 4:40 PM  For on call review [email or web address].

## 2024-10-16 ENCOUNTER — Inpatient Hospital Stay

## 2024-10-16 DIAGNOSIS — L97529 Non-pressure chronic ulcer of other part of left foot with unspecified severity: Secondary | ICD-10-CM | POA: Diagnosis not present

## 2024-10-16 LAB — PROTIME-INR
INR: 1.1 (ref 0.8–1.2)
Prothrombin Time: 15.2 s (ref 11.4–15.2)

## 2024-10-16 LAB — BASIC METABOLIC PANEL WITH GFR
Anion gap: 6 (ref 5–15)
BUN: 22 mg/dL — ABNORMAL HIGH (ref 6–20)
CO2: 29 mmol/L (ref 22–32)
Calcium: 8.6 mg/dL — ABNORMAL LOW (ref 8.9–10.3)
Chloride: 100 mmol/L (ref 98–111)
Creatinine, Ser: 1.42 mg/dL — ABNORMAL HIGH (ref 0.61–1.24)
GFR, Estimated: 58 mL/min — ABNORMAL LOW (ref >60)
Glucose, Bld: 327 mg/dL — ABNORMAL HIGH (ref 70–99)
Potassium: 4.8 mmol/L (ref 3.5–5.1)
Sodium: 135 mmol/L (ref 135–145)

## 2024-10-16 LAB — CBC
HCT: 26.5 % — ABNORMAL LOW (ref 39.0–52.0)
Hemoglobin: 8.2 g/dL — ABNORMAL LOW (ref 13.0–17.0)
MCH: 24.6 pg — ABNORMAL LOW (ref 26.0–34.0)
MCHC: 30.9 g/dL (ref 30.0–36.0)
MCV: 79.3 fL — ABNORMAL LOW (ref 80.0–100.0)
Platelets: 573 K/uL — ABNORMAL HIGH (ref 150–400)
RBC: 3.34 MIL/uL — ABNORMAL LOW (ref 4.22–5.81)
RDW: 14.8 % (ref 11.5–15.5)
WBC: 6.7 K/uL (ref 4.0–10.5)
nRBC: 0 % (ref 0.0–0.2)

## 2024-10-16 LAB — GLUCOSE, CAPILLARY
Glucose-Capillary: 271 mg/dL — ABNORMAL HIGH (ref 70–99)
Glucose-Capillary: 213 mg/dL — ABNORMAL HIGH (ref 70–99)
Glucose-Capillary: 211 mg/dL — ABNORMAL HIGH (ref 70–99)
Glucose-Capillary: 246 mg/dL — ABNORMAL HIGH (ref 70–99)

## 2024-10-16 LAB — FERRITIN: Ferritin: 109 ng/mL (ref 24–336)

## 2024-10-16 LAB — VITAMIN B12: Vitamin B-12: 477 pg/mL (ref 180–914)

## 2024-10-16 LAB — FOLATE: Folate: 10.3 ng/mL (ref >5.9)

## 2024-10-16 LAB — TYPE AND SCREEN
ABO/RH(D): B POS
Antibody Screen: NEGATIVE

## 2024-10-16 LAB — IRON AND TIBC
Iron: 15 ug/dL — ABNORMAL LOW (ref 45–182)
Saturation Ratios: 9 % — ABNORMAL LOW (ref 17.9–39.5)
TIBC: 181 ug/dL — ABNORMAL LOW (ref 250–450)
UIBC: 165 ug/dL

## 2024-10-16 MED ORDER — INSULIN ASPART 100 UNIT/ML IJ SOLN
5.0000 [IU] | Freq: Three times a day (TID) | INTRAMUSCULAR | Status: DC
  Administered 2024-10-16 – 2024-10-18 (×4): 5 [IU] via SUBCUTANEOUS
  Filled 2024-10-16 (×5): qty 5

## 2024-10-16 MED ORDER — GADOBUTROL 1 MMOL/ML IV SOLN
10.0000 mL | Freq: Once | INTRAVENOUS | Status: AC | PRN
  Administered 2024-10-16: 10 mL via INTRAVENOUS

## 2024-10-16 MED ORDER — INSULIN GLARGINE-YFGN 100 UNIT/ML ~~LOC~~ SOLN
30.0000 [IU] | Freq: Every day | SUBCUTANEOUS | Status: DC
  Administered 2024-10-16 – 2024-10-17 (×2): 30 [IU] via SUBCUTANEOUS
  Filled 2024-10-16 (×3): qty 0.3

## 2024-10-16 NOTE — Plan of Care (Signed)
  Problem: Education: Goal: Knowledge of General Education information will improve Description: Including pain rating scale, medication(s)/side effects and non-pharmacologic comfort measures Outcome: Progressing   Problem: Clinical Measurements: Goal: Will remain free from infection Outcome: Progressing   Problem: Clinical Measurements: Goal: Respiratory complications will improve Outcome: Progressing   Problem: Clinical Measurements: Goal: Cardiovascular complication will be avoided Outcome: Progressing   Problem: Activity: Goal: Risk for activity intolerance will decrease Outcome: Progressing   Problem: Pain Managment: Goal: General experience of comfort will improve and/or be controlled Outcome: Progressing   Problem: Safety: Goal: Ability to remain free from injury will improve Outcome: Progressing

## 2024-10-16 NOTE — Plan of Care (Signed)

## 2024-10-16 NOTE — Progress Notes (Signed)
 Mobility Specialist - Progress Note   10/16/24 1427  Mobility  Activity Ambulated with assistance  Level of Assistance Standby assist, set-up cues, supervision of patient - no hands on  Assistive Device None  Distance Ambulated (ft) 20 ft  Activity Response Tolerated well  Mobility visit 1 Mobility  Mobility Specialist Start Time (ACUTE ONLY) 1413  Mobility Specialist Stop Time (ACUTE ONLY) 1424  Mobility Specialist Time Calculation (min) (ACUTE ONLY) 11 min   Pt amb to/from the door with sup, tolerated well. Pt left seated EOB with needs within reach.  America Silvan Mobility Specialist 10/16/24 2:28 PM

## 2024-10-16 NOTE — Progress Notes (Signed)
 Progress Note   Patient: [name] FMW:[id number] DOB: 11/13/1966 DOA: 10/15/2024     1 DOS: the patient was seen and examined on 10/16/2024   Brief hospital course: Per H&P HPI   HPI: Trevis Eden is a 58 y.o. 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      Patient has had pain in his right knee for at least the past 3 weeks.  He has had some falls due to his knee giving out .  He presented to [facility] 10/24.  MRI obtained at that time was inconclusive for osteomyelitis.  An arthrocentesis was attempted but no synovial fluid was able to be collected 1024.  Infectious disease was consulted and had low suspicion for septic arthritis.    He followed up with infectious disease 10/29.  They recommended given patient's persistent pain and concern for possible septic joint and a follow-up with orthopedic team for arthrocentesis. He underwent arthrocentesis which showed no crystals and 24K WBC with neutrophilic predominance. He was scheduled to follow up 11/13 for further treatment.  However he continued to have pain in his right knee.  He presented to the ED 11/8.  Repeat arthrocentesis was performed.  Synovial fluid analysis again showed white blood cells in the 20,000 range.  Synovial fluid cultures were sent which are no growth to date.  He had no crystals. He was again discharged with pain medication.    Patient presents again with swelling and pain in his R knee. He has no systemic symptoms of infection. He denies dyspnea, cough, diarrhea, dysuria.      Of note, patient was seen by podiatry team 11/10.  It was recommended at that time that patient presented to the ED for further management of his chronic left foot ulcers.  Patient has no pain from these.  During a previous hospitalization it was thought that patient had foul-smelling purulent  drainage from these.  The left foot showed chronic osteomyelitis during hospitalization 10/24.  He has completed antibiotics for this since that time.  Assessment and Plan: Non healing chronic L foot diabetic ulcer  Chronic osteomyelitis    Patient received a dose of vancomycin  in the ED.  He is afebrile.  Patient follows with podiatry outpatient for this and they recommended that he come for definitive management of his foot ulcers.  Podiatry team saw patient.     T2DM A1c 6.7 09/2024. Currently takes 22u of NPH twice daily as well as metformin .  Started patient on 25u of semglee , will increase this given elevated AM BG.      HTN Normotensive. Will hold on resuming antihypertensives at this time.    AKI  Reported CKD. Likely mild. Serum creatinine improved on AM labs.  Conitnue to monitor Avoid nephrotoxic agents       Normocytic anemia  Hgb stable. Iron  deficiency noted on labs ~9 months ago. Will need further work up for this outpatient.  -Continues to have Fe deficiency  -Continue PO iron     Hyperkalemia Resolved      PseudohypoNA Resolved  Due to elevated blood glucose.    HLD LDL 62 5 months ago.    Cocaine use Encourage cessation  R knee effusion Multiple recent arthrocenteses. Low concern for septic arthritis. Improved with acetaminophen  and oxycodone .       Subjective: Reports L knee pain is  improved.   Physical Exam: Vitals:   10/15/24 1727 10/15/24 1955 10/16/24 0425 10/16/24 0856  BP: 125/88 (!) 116/53 134/68 129/69  Pulse: 87 89 85 87  Resp: 18 18 18 16   Temp: 98.4 F (36.9 C) 98.5 F (36.9 C) 98.2 F (36.8 C) 98.6 F (37 C)  TempSrc:    Oral  SpO2: 99% 98% 99% 100%  Weight:      Height:       Physical Exam  Constitutional: In no distress.  Cardiovascular: Normal rate, regular rhythm. No lower extremity edema  Pulmonary: Non labored breathing on room air, no wheezing or rales.   Abdominal: Soft. Non distended and non  tender Musculoskeletal: L knee with swelling  Neurological: Alert and oriented to person, place, and time. Non focal  Skin: Skin is warm and dry.   Data Reviewed:     Latest Ref Rng & Units 10/16/2024    6:44 AM 10/15/2024    1:06 PM 10/01/2024    4:00 PM  CBC  WBC 4.0 - 10.5 K/uL 6.7  10.0  9.7   Hemoglobin 13.0 - 17.0 g/dL 8.2  8.8  8.5   Hematocrit 39.0 - 52.0 % 26.5  29.3  27.9   Platelets 150 - 400 K/uL 573  638  472       Latest Ref Rng & Units 10/16/2024    6:44 AM 10/15/2024    6:27 PM 10/15/2024    1:06 PM  BMP  Glucose 70 - 99 mg/dL 672  707  575   BUN 6 - 20 mg/dL 22  21  20    Creatinine 0.61 - 1.24 mg/dL 8.57  8.45  8.71   Sodium 135 - 145 mmol/L 135  136  132   Potassium 3.5 - 5.1 mmol/L 4.8  4.6  5.3   Chloride 98 - 111 mmol/L 100  99  96   CO2 22 - 32 mmol/L 29  28  27    Calcium  8.9 - 10.3 mg/dL 8.6  8.8  9.0      Family Communication: None at bedside.   Disposition: Status is: Inpatient Remains inpatient appropriate because: pending operation   Planned Discharge Destination: Pending PT/OT     Time spent: 35 minutes  Author: Alban Pepper, MD 10/16/2024 6:04 PM  For on call review [email or web address].

## 2024-10-16 NOTE — Progress Notes (Addendum)
 Wound care consult placed. Will notify incoming shift.  Update 0631: See new orders.

## 2024-10-16 NOTE — TOC Initial Note (Addendum)
 Transition of Care [facility]) - Initial/Assessment Note    Patient Details  Name: [name] MRN: [id number] Date of Birth: May 07, 1966  Transition of Care [facility]) CM/SW Contact:    Marinda Cooks, RN Phone Number: 10/16/2024, 2:40 PM  Clinical Narrative:                 This CM spoke with pt introduced role  and completed Initial assessement. Pt A&Ox4 Pt reports living in an Apt that with steps to enter and no elevator access at address listed on demographic Sheet . Pt informed he  has family support provided by his daughters and sister who lives in [place]. [facility] [address] & PCP is listed as Dr. Corrina Sabin , however pt informed he has not seen MD in over a yr .Pt shared he has DME  that includes RW,W/C, & cane. Pt denied HH  services prior to this admission being in place .   DC Planning :This CM spoke with pt regarding tentative DC plan pt expressed that he would agree to SNF/Rehab if recommended .   This CM provided pt's Embedded Case Manager with his [facility] Plan [place]) info as [phone] . Ms.Clayborne confirmed that pt had a prior SNF stay at [facility] located in [place] from 08/08 thru 10/24 . This CM provided in network SNF facilities that include but not limited to [facility], [place] tool works in [facility], in [place], & [address] in [place]. Jon conveyed she can be contacted for assisting with DC planning / resources for pt. This CM updated pt of above info regarding SNF facilities within his ins network, pt informed his 1st choice is [facility] in [place] & his 2cd choice is [facility] in [place]. TOC  will cont to follow pt's dc planning / care coordination during hospital stay and update as applicable.     15:07pm This CM rec'd call back from Jon pt's (Embedded CM with [employer]) and provided additional facilities in [place] listed as [facility],  [facility], & [facility]. This CM confirmed with pt these facilities would  be  his 1st options due to locations being  in [place] close to his family if PT changes recommendations for SNF . This CM also provided HH in network with pt's insurance plan listed as [facility], & [employer] per Jon .     Expected Discharge Plan and Services  TBD  Prior Living Arrangements/Services  Home with Self         Activities of Daily Living   ADL Screening (condition at time of admission) Independently performs ADLs?: No Does the patient have a NEW difficulty with bathing/dressing/toileting/self-feeding that is expected to last >3 days?: No Does the patient have a NEW difficulty with getting in/out of bed, walking, or climbing stairs that is expected to last >3 days?: No Does the patient have a NEW difficulty with communication that is expected to last >3 days?: No Is the patient deaf or have difficulty hearing?: No Does the patient have difficulty seeing, even when wearing glasses/contacts?: No Does the patient have difficulty concentrating, remembering, or making decisions?: No  Permission Sought/Granted                  Emotional Assessment              Admission diagnosis:  Non-healing ulcer of left foot (HCC) [L97.529] Patient Active Problem  List   Diagnosis Date Noted   Non-healing ulcer of left foot (HCC) 10/15/2024   Right knee pain 09/27/2024   Acute renal failure superimposed on stage 3a chronic kidney disease, unspecified acute renal failure type (HCC) 09/26/2024   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/02/2024   Wound infection 07/01/2024   Uncontrolled type 2 diabetes mellitus with hyperglycemia, with long-term current use of insulin  (HCC) 06/27/2024   Acute osteomyelitis of right foot (HCC) 06/26/2024   MRSA bacteremia 04/21/2024   HLD (hyperlipidemia) 04/17/2024   Chronic kidney disease, stage 3a (HCC) 04/17/2024   Normocytic anemia 04/17/2024   Amputation  of right great toe 03/31/2024   Necrotizing soft tissue infection 03/26/2024   Hypertension associated with diabetes (HCC) 12/12/2022   Toe osteomyelitis, left (HCC) 12/12/2022   Pyogenic inflammation of bone (HCC) 06/02/2022   PICC (peripherally inserted central catheter) in place 06/02/2022   Medication management 06/02/2022   Diabetic foot infection (HCC) 06/02/2022   Smoking 06/02/2022   Tobacco abuse 05/06/2022   Hyperkalemia 05/06/2022   Obesity (BMI 30-39.9) 05/05/2022   Cellulitis and abscess of foot    Injury of left foot    Diabetic foot ulcer (HCC) 12/14/2021   PCP:  [name], MD Pharmacy:   [facility] [address] [place] [address] Phone: [phone] Fax: [phone]  [id number] [place] [place] - [address] [facility] [address] [place] [address] Phone: [phone] Fax: [phone]  [facility] [address] [place] [address] Phone: [phone] Fax: [phone]     Social Drivers of Health (SDOH) Social History: SDOH Screenings   Food Insecurity: No Food Insecurity (10/15/2024)  Housing: Low Risk  (10/15/2024)  Transportation Needs: No Transportation Needs (10/15/2024)  Utilities: Not At Risk (10/15/2024)  Depression (PHQ2-9): Low Risk  (08/28/2023)  Financial Resource Strain: Patient Declined (06/28/2023)   Received from [facility]  Social Connections: Moderately Integrated (06/26/2024)  Tobacco Use: Medium Risk (10/15/2024)  Health Literacy: Adequate Health Literacy (05/01/2024)   SDOH Interventions:     Readmission Risk Interventions     No data to display

## 2024-10-16 NOTE — Consult Note (Signed)
 WOC Nurse Consult Note:Patient had revision R TMA and L foot wound debridement with graft applications on 06/29/24 by podiatry; followed in podiatry office with use of Betadine  wet to dry dressings daily  Reason for Consult:non healing ulcer left foot  Wound type: full thickness L plantar foot wounds r/t neuropathy and PVD  Pressure Injury POA:NA not pressure  Measurement: see nursing flowsheet  Wound bed: L plantar foot with 2 separate areas of ulcerations pink moist ; ulcer pink moist between 1st and 2nd digit  Drainage (amount, consistency, odor) see nursing flowsheet  Periwound: callusing  Dressing procedure/placement/frequency: Cleanse L plantar foot wounds and wound in between 1st and 2nd digit with Betadine , apply Betadine  moist gauze to wound beds daily, cover with dry gauze and secure with silicone foam or Kerlix roll gauze whichever is preferred/works best.   Podiatry plans amputation and will provide ongoing management of wounds. Any wound care orders placed by podiatry or orthopedics supercede wound care orders placed by [facility] RN.   WOC team will not follow Re-consult if further needs arise.   Thank you,   Powell Bar MSN, RN-BC, [employer]

## 2024-10-17 ENCOUNTER — Inpatient Hospital Stay

## 2024-10-17 ENCOUNTER — Encounter: Payer: Self-pay | Admitting: Student

## 2024-10-17 ENCOUNTER — Inpatient Hospital Stay: Admitting: Anesthesiology

## 2024-10-17 ENCOUNTER — Encounter
Admission: EM | Disposition: A | Discharge status: Skilled Nursing Facility | Payer: Self-pay | Source: Home / Self Care | Attending: Student

## 2024-10-17 DIAGNOSIS — L97529 Non-pressure chronic ulcer of other part of left foot with unspecified severity: Secondary | ICD-10-CM | POA: Diagnosis not present

## 2024-10-17 HISTORY — PX: TRANSMETATARSAL AMPUTATION: SHX6197

## 2024-10-17 LAB — CBC
HCT: 26.1 % — ABNORMAL LOW (ref 39.0–52.0)
Hemoglobin: 8 g/dL — ABNORMAL LOW (ref 13.0–17.0)
MCH: 24.5 pg — ABNORMAL LOW (ref 26.0–34.0)
MCHC: 30.7 g/dL (ref 30.0–36.0)
MCV: 80.1 fL (ref 80.0–100.0)
Platelets: 523 K/uL — ABNORMAL HIGH (ref 150–400)
RBC: 3.26 MIL/uL — ABNORMAL LOW (ref 4.22–5.81)
RDW: 14.6 % (ref 11.5–15.5)
WBC: 7.4 K/uL (ref 4.0–10.5)
nRBC: 0 % (ref 0.0–0.2)

## 2024-10-17 LAB — BASIC METABOLIC PANEL WITH GFR
Anion gap: 6 (ref 5–15)
Anion gap: 7 (ref 5–15)
BUN: 18 mg/dL (ref 6–20)
BUN: 17 mg/dL (ref 6–20)
CO2: 29 mmol/L (ref 22–32)
CO2: 28 mmol/L (ref 22–32)
Calcium: 8.6 mg/dL — ABNORMAL LOW (ref 8.9–10.3)
Calcium: 8.6 mg/dL — ABNORMAL LOW (ref 8.9–10.3)
Chloride: 97 mmol/L — ABNORMAL LOW (ref 98–111)
Chloride: 98 mmol/L (ref 98–111)
Creatinine, Ser: 1.22 mg/dL (ref 0.61–1.24)
Creatinine, Ser: 1.1 mg/dL (ref 0.61–1.24)
GFR, Estimated: >60 mL/min (ref >60)
GFR, Estimated: >60 mL/min (ref >60)
Glucose, Bld: 312 mg/dL — ABNORMAL HIGH (ref 70–99)
Glucose, Bld: 177 mg/dL — ABNORMAL HIGH (ref 70–99)
Potassium: 5.2 mmol/L — ABNORMAL HIGH (ref 3.5–5.1)
Potassium: 4.7 mmol/L (ref 3.5–5.1)
Sodium: 132 mmol/L — ABNORMAL LOW (ref 135–145)
Sodium: 134 mmol/L — ABNORMAL LOW (ref 135–145)

## 2024-10-17 LAB — GLUCOSE, CAPILLARY
Glucose-Capillary: 169 mg/dL — ABNORMAL HIGH (ref 70–99)
Glucose-Capillary: 80 mg/dL (ref 70–99)
Glucose-Capillary: 96 mg/dL (ref 70–99)
Glucose-Capillary: 136 mg/dL — ABNORMAL HIGH (ref 70–99)

## 2024-10-17 SURGERY — AMPUTATION, FOOT, TRANSMETATARSAL
Anesthesia: General | Site: Toe | Laterality: Left

## 2024-10-17 MED ORDER — 0.9 % SODIUM CHLORIDE (POUR BTL) OPTIME
TOPICAL | Status: DC | PRN
  Administered 2024-10-17: 500 mL

## 2024-10-17 MED ORDER — ONDANSETRON HCL 4 MG/2ML IJ SOLN
INTRAMUSCULAR | Status: AC
  Filled 2024-10-17: qty 2

## 2024-10-17 MED ORDER — GLYCOPYRROLATE 0.2 MG/ML IJ SOLN
INTRAMUSCULAR | Status: DC | PRN
  Administered 2024-10-17: .2 mg via INTRAVENOUS

## 2024-10-17 MED ORDER — SODIUM CHLORIDE 0.9 % IV SOLN: INTRAVENOUS | Status: DC | PRN

## 2024-10-17 MED ORDER — BUPIVACAINE HCL (PF) 0.5 % IJ SOLN
INTRAMUSCULAR | Status: AC
  Filled 2024-10-17: qty 30

## 2024-10-17 MED ORDER — MIDAZOLAM HCL 2 MG/2ML IJ SOLN
INTRAMUSCULAR | Status: AC
  Filled 2024-10-17: qty 2

## 2024-10-17 MED ORDER — PROPOFOL 10 MG/ML IV BOLUS
INTRAVENOUS | Status: DC | PRN
  Administered 2024-10-17: 150 mg via INTRAVENOUS

## 2024-10-17 MED ORDER — FENTANYL CITRATE (PF) 100 MCG/2ML IJ SOLN: 25.0000 ug | INTRAMUSCULAR | Status: DC | PRN

## 2024-10-17 MED ORDER — DEXAMETHASONE SOD PHOSPHATE PF 10 MG/ML IJ SOLN
INTRAMUSCULAR | Status: DC | PRN
  Administered 2024-10-17: 10 mg via INTRAVENOUS

## 2024-10-17 MED ORDER — ACETAMINOPHEN 10 MG/ML IV SOLN: 1000.0000 mg | Freq: Once | INTRAVENOUS | Status: DC | PRN

## 2024-10-17 MED ORDER — LIDOCAINE HCL (PF) 2 % IJ SOLN
INTRAMUSCULAR | Status: DC | PRN
  Administered 2024-10-17: 50 mg

## 2024-10-17 MED ORDER — OXYCODONE HCL 5 MG/5ML PO SOLN: 5.0000 mg | Freq: Once | ORAL | Status: DC | PRN

## 2024-10-17 MED ORDER — LIDOCAINE HCL (PF) 2 % IJ SOLN
INTRAMUSCULAR | Status: AC
  Filled 2024-10-17: qty 5

## 2024-10-17 MED ORDER — ONDANSETRON HCL 4 MG/2ML IJ SOLN
INTRAMUSCULAR | Status: DC | PRN
Start: 2024-10-17 — End: 2024-10-17
  Administered 2024-10-17: 4 mg via INTRAVENOUS

## 2024-10-17 MED ORDER — CEFAZOLIN SODIUM-DEXTROSE 2-3 GM-%(50ML) IV SOLR
INTRAVENOUS | Status: DC | PRN
  Administered 2024-10-17: 2 g via INTRAVENOUS

## 2024-10-17 MED ORDER — OXYCODONE HCL 5 MG PO TABS: 5.0000 mg | ORAL_TABLET | Freq: Once | ORAL | Status: DC | PRN

## 2024-10-17 MED ORDER — LIDOCAINE HCL 1 % IJ SOLN
INTRAMUSCULAR | Status: DC | PRN
  Administered 2024-10-17: 10 mL

## 2024-10-17 MED ORDER — MIDAZOLAM HCL 5 MG/5ML IJ SOLN
INTRAMUSCULAR | Status: DC | PRN
  Administered 2024-10-17: 2 mg via INTRAVENOUS

## 2024-10-17 MED ORDER — DROPERIDOL 2.5 MG/ML IJ SOLN: 0.6250 mg | Freq: Once | INTRAMUSCULAR | Status: DC | PRN

## 2024-10-17 MED ORDER — GLYCOPYRROLATE 0.2 MG/ML IJ SOLN
INTRAMUSCULAR | Status: AC
Start: 2024-10-17 — End: 2024-10-17
  Filled 2024-10-17: qty 1

## 2024-10-17 MED ORDER — LIDOCAINE HCL (PF) 1 % IJ SOLN
INTRAMUSCULAR | Status: AC
  Filled 2024-10-17: qty 30

## 2024-10-17 MED ORDER — FENTANYL CITRATE (PF) 100 MCG/2ML IJ SOLN
INTRAMUSCULAR | Status: AC
  Filled 2024-10-17: qty 2

## 2024-10-17 MED ORDER — PHENYLEPHRINE 80 MCG/ML (10ML) SYRINGE FOR IV PUSH (FOR BLOOD PRESSURE SUPPORT)
PREFILLED_SYRINGE | INTRAVENOUS | Status: DC | PRN
  Administered 2024-10-17: 80 ug via INTRAVENOUS
  Administered 2024-10-17 (×2): 160 ug via INTRAVENOUS
  Administered 2024-10-17: 80 ug via INTRAVENOUS
  Administered 2024-10-17 (×2): 160 ug via INTRAVENOUS

## 2024-10-17 MED ORDER — PROPOFOL 10 MG/ML IV BOLUS
INTRAVENOUS | Status: AC
  Filled 2024-10-17: qty 20

## 2024-10-17 MED ORDER — DAKINS (1/4 STRENGTH) 0.125 % EX SOLN
CUTANEOUS | Status: AC
  Filled 2024-10-17: qty 473

## 2024-10-17 MED ORDER — PHENYLEPHRINE 80 MCG/ML (10ML) SYRINGE FOR IV PUSH (FOR BLOOD PRESSURE SUPPORT)
PREFILLED_SYRINGE | INTRAVENOUS | Status: AC
  Filled 2024-10-17: qty 10

## 2024-10-17 SURGICAL SUPPLY — 45 items
BLADE SURG 15 STRL LF DISP TIS (BLADE) IMPLANT
BLADE SURG MINI STRL (BLADE) IMPLANT
BLADE SURG SZ10 CARB STEEL (BLADE) IMPLANT
BLADE SW THK.38XMED LNG THN (BLADE) IMPLANT
BNDG COHESIVE 4X5 TAN STRL LF (GAUZE/BANDAGES/DRESSINGS) ×1 IMPLANT
BNDG ELASTIC 4X5.8 VLCR NS LF (GAUZE/BANDAGES/DRESSINGS) ×1 IMPLANT
BNDG ESMARCH 4X12 STRL LF (GAUZE/BANDAGES/DRESSINGS) ×1 IMPLANT
BNDG GAUZE DERMACEA FLUFF 4 (GAUZE/BANDAGES/DRESSINGS) IMPLANT
BNDG STRETCH GAUZE 3IN X12FT (GAUZE/BANDAGES/DRESSINGS) ×1 IMPLANT
CNTNR URN SCR LID CUP LEK RST (MISCELLANEOUS) IMPLANT
CUFF TOURN SGL QUICK 12 (TOURNIQUET CUFF) IMPLANT
CUFF TOURN SGL QUICK 18X4 (TOURNIQUET CUFF) IMPLANT
DRAPE FLUOR MINI C-ARM 54X84 (DRAPES) IMPLANT
DRSG EMULSION OIL 3X3 NADH (GAUZE/BANDAGES/DRESSINGS) IMPLANT
DRSG EMULSION OIL 3X8 NADH (GAUZE/BANDAGES/DRESSINGS) IMPLANT
DURAPREP 26ML APPLICATOR (WOUND CARE) IMPLANT
ELECTRODE REM PT RTRN 9FT ADLT (ELECTROSURGICAL) ×1 IMPLANT
GAUZE PACKING 1/2INX5YD STRL (GAUZE/BANDAGES/DRESSINGS) IMPLANT
GAUZE PACKING IODOFORM 1/2INX (GAUZE/BANDAGES/DRESSINGS) ×1 IMPLANT
GAUZE SPONGE 4X4 12PLY STRL (GAUZE/BANDAGES/DRESSINGS) ×1 IMPLANT
GAUZE STRETCH 2X75IN STRL (MISCELLANEOUS) IMPLANT
GLOVE BIOGEL PI IND STRL 6.5 (GLOVE) ×1 IMPLANT
GLOVE SURG SYN 6.5 PF PI (GLOVE) ×1 IMPLANT
GOWN STRL REUS W/ TWL LRG LVL3 (GOWN DISPOSABLE) ×1 IMPLANT
KIT TURNOVER KIT A (KITS) ×1 IMPLANT
MANIFOLD NEPTUNE II (INSTRUMENTS) ×1 IMPLANT
NDL HYPO 25X1 1.5 SAFETY (NEEDLE) ×1 IMPLANT
NDL SAFETY ECLIP 18X1.5 (MISCELLANEOUS) ×1 IMPLANT
NEEDLE HYPO 25X1 1.5 SAFETY (NEEDLE) ×1 IMPLANT
NS IRRIG 500ML POUR BTL (IV SOLUTION) ×1 IMPLANT
PACK EXTREMITY ARMC (MISCELLANEOUS) ×1 IMPLANT
PAD ABD DERMACEA PRESS 5X9 (GAUZE/BANDAGES/DRESSINGS) IMPLANT
PENCIL SMOKE EVACUATOR (MISCELLANEOUS) ×1 IMPLANT
SHIELD FULL FACE ANTIFOG 7M (MISCELLANEOUS) ×1 IMPLANT
SOL .9 NS 3000ML IRR UROMATIC (IV SOLUTION) IMPLANT
SOLUTION PREP PVP 2OZ (MISCELLANEOUS) IMPLANT
STOCKINETTE IMPERV 14X48 (MISCELLANEOUS) ×1 IMPLANT
STOCKINETTE M/LG 89821 (MISCELLANEOUS) ×1 IMPLANT
SUT VICRYL AB 3-0 FS1 BRD 27IN (SUTURE) IMPLANT
SUTURE EHLN 3-0 FS-10 30 BLK (SUTURE) IMPLANT
SWAB CULTURE AMIES ANAERIB BLU (MISCELLANEOUS) IMPLANT
SYR 10ML LL (SYRINGE) ×3 IMPLANT
TIP FAN IRRIG PULSAVAC PLUS (DISPOSABLE) IMPLANT
TRAP FLUID SMOKE EVACUATOR (MISCELLANEOUS) ×1 IMPLANT
WATER STERILE IRR 500ML POUR (IV SOLUTION) ×1 IMPLANT

## 2024-10-17 NOTE — Inpatient Diabetes Management (Signed)
 Inpatient Diabetes Program Recommendations  AACE/ADA: New Consensus Statement on Inpatient Glycemic Control  Target Ranges:  Prepandial:   less than 140 mg/dL      Peak postprandial:   less than 180 mg/dL (1-2 hours)      Critically ill patients:  140 - 180 mg/dL    Latest Reference Range & Units 10/16/24 08:09 10/16/24 11:58 10/16/24 16:42 10/16/24 21:20 10/17/24 08:21  Glucose-Capillary 70 - 99 mg/dL 728 (H) 786 (H) 788 (H) 246 (H) 169 (H)   Review of Glycemic Control  Diabetes history: DM2 Outpatient Diabetes medications: NPH 22 units BID, Novolog  0-15 units TID with meals, Metformin  XR 1000 mg BID, Dexcom G6 Current orders for Inpatient glycemic control: Semglee  30 units at bedtime, Novolog  5 units TID with meals, Novolog  0-9 units TID with meals  Inpatient Diabetes Program Recommendations:    Insulin : Please consider increasing meal coverage to Novolog  8 units TID with meals and adding Novolog  0-5 units at bedtime.  Thanks, Earnie Gainer, RN, MSN, CDCES Diabetes Coordinator Inpatient Diabetes Program [phone] (Team Pager from 8am to 5pm)

## 2024-10-17 NOTE — Progress Notes (Signed)
 Progress Note   Patient: [name] FMW:[id number] DOB: 11-13-1966 DOA: 10/15/2024     2 DOS: the patient was seen and examined on 10/17/2024   Brief hospital course: Per H&P HPI   HPI: Ahnaf Caponi is a 58 y.o. 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      Patient has had pain in his right knee for at least the past 3 weeks.  He has had some falls due to his knee giving out .  He presented to [facility] 10/24.  MRI obtained at that time was inconclusive for osteomyelitis.  An arthrocentesis was attempted but no synovial fluid was able to be collected 1024.  Infectious disease was consulted and had low suspicion for septic arthritis.    He followed up with infectious disease 10/29.  They recommended given patient's persistent pain and concern for possible septic joint and a follow-up with orthopedic team for arthrocentesis. He underwent arthrocentesis which showed no crystals and 24K WBC with neutrophilic predominance. He was scheduled to follow up 11/13 for further treatment.  However he continued to have pain in his right knee.  He presented to the ED 11/8.  Repeat arthrocentesis was performed.  Synovial fluid analysis again showed white blood cells in the 20,000 range.  Synovial fluid cultures were sent which are no growth to date.  He had no crystals. He was again discharged with pain medication.    Patient presents again with swelling and pain in his R knee. He has no systemic symptoms of infection. He denies dyspnea, cough, diarrhea, dysuria.      Of note, patient was seen by podiatry team 11/10.  It was recommended at that time that patient presented to the ED for further management of his chronic left foot ulcers.  Patient has no pain from these.  During a previous hospitalization it was thought that patient had foul-smelling purulent  drainage from these.  The left foot showed chronic osteomyelitis during hospitalization 10/24.  He has completed antibiotics for this since that time.  Assessment and Plan: Non healing chronic L foot diabetic ulcer  Chronic osteomyelitis    Patient received a dose of vancomycin  in the ED.  He is afebrile.  Patient follows with podiatry outpatient for this and they recommended that he come for definitive management of his foot ulcers.  Awaiting surgery.     T2DM A1c 6.7 09/2024. Currently takes 22u of NPH twice daily as well as metformin .  CBG (last 3)  Recent Labs    10/17/24 0821 10/17/24 1154 10/17/24 1649  GLUCAP 169* 80 96   BG improved. Continue lantus  30 u nightly. 5u TID with meals      HTN Normotensive. Will hold on resuming antihypertensives at this time.    AKI Improving. Reported CKD. Likely mild. Serum creatinine improved on AM labs.  Conitnue to monitor Avoid nephrotoxic agents        Latest Ref Rng & Units 10/17/2024    8:20 AM 10/17/2024    4:16 AM 10/16/2024    6:44 AM  BMP  Glucose 70 - 99 mg/dL 822  687  672   BUN 6 - 20 mg/dL 17  18  22    Creatinine 0.61 - 1.24 mg/dL 8.89  8.77  8.57   Sodium 135 - 145 mmol/L 134  132  135   Potassium  3.5 - 5.1 mmol/L 4.7  5.2  4.8   Chloride 98 - 111 mmol/L 98  97  100   CO2 22 - 32 mmol/L 28  29  29    Calcium  8.9 - 10.3 mg/dL 8.6  8.6  8.6       Normocytic anemia  Hgb stable. Iron  deficiency noted on labs ~9 months ago. Will need further work up for this outpatient.  -Continues to have Fe deficiency  -Continue PO iron     Hyperkalemia Resolved      Hyponatremia  Mild. CTM    HLD LDL 62 5 months ago.    Cocaine use Encourage cessation  R knee effusion Multiple recent arthrocenteses. Low concern for septic arthritis. Improved with acetaminophen  and oxycodone . Continue pain control, will need to see outpatient orthopedic team.       Subjective: No issues on.   Physical Exam: Vitals:    10/16/24 2049 10/17/24 0349 10/17/24 0824 10/17/24 1636  BP: 126/72 131/75 116/87 (!) 144/79  Pulse: 87 93 90 90  Resp: 20 19 16 16   Temp: 98.2 F (36.8 C) 98.1 F (36.7 C) 98.7 F (37.1 C) 98.4 F (36.9 C)  TempSrc:    Temporal  SpO2: 100% 100% 99% 100%  Weight:    104.3 kg  Height:    5' 9 (1.753 m)    Constitutional: In no distress.  Cardiovascular: Normal rate, regular rhythm. No lower extremity edema  Pulmonary: Non labored breathing on room air, no wheezing or rales.   Abdominal: Soft. Non distended and non tender Musculoskeletal: L knee with effusion, no erythema of overlying skin, No increased WTT of overlying skin.     S/p R TMA, L foot with ulcers of plantar surface.  Neurological: Alert and oriented to person, place, and time. Non focal  Skin: Skin is warm and dry.    Data Reviewed:     Latest Ref Rng & Units 10/17/2024    4:16 AM 10/16/2024    6:44 AM 10/15/2024    1:06 PM  CBC  WBC 4.0 - 10.5 K/uL 7.4  6.7  10.0   Hemoglobin 13.0 - 17.0 g/dL 8.0  8.2  8.8   Hematocrit 39.0 - 52.0 % 26.1  26.5  29.3   Platelets 150 - 400 K/uL 523  573  638       Latest Ref Rng & Units 10/17/2024    8:20 AM 10/17/2024    4:16 AM 10/16/2024    6:44 AM  BMP  Glucose 70 - 99 mg/dL 822  687  672   BUN 6 - 20 mg/dL 17  18  22    Creatinine 0.61 - 1.24 mg/dL 8.89  8.77  8.57   Sodium 135 - 145 mmol/L 134  132  135   Potassium 3.5 - 5.1 mmol/L 4.7  5.2  4.8   Chloride 98 - 111 mmol/L 98  97  100   CO2 22 - 32 mmol/L 28  29  29    Calcium  8.9 - 10.3 mg/dL 8.6  8.6  8.6      Family Communication: None at bedside.   Disposition: Status is: Inpatient Remains inpatient appropriate because: pending operation   Planned Discharge Destination: Pending PT/OT     Time spent: 35 minutes  Author: Alban Pepper, MD 10/17/2024 7:34 PM  For on call review [email or web address].

## 2024-10-17 NOTE — Anesthesia Preprocedure Evaluation (Addendum)
 Anesthesia Evaluation  Patient identified by MRN, date of birth, ID band Patient awake    Reviewed: Allergy & Precautions, NPO status , Patient's Chart, lab work & pertinent test results  History of Anesthesia Complications Negative for: history of anesthetic complications  Airway Mallampati: III  TM Distance: <3 FB Neck ROM: full    Dental  (+) Chipped, Poor Dentition   Pulmonary neg shortness of breath, former smoker   Pulmonary exam normal        Cardiovascular hypertension, (-) angina Normal cardiovascular exam     Neuro/Psych negative neurological ROS  negative psych ROS   GI/Hepatic negative GI ROS, Neg liver ROS,neg GERD  ,,  Endo/Other  diabetes, Type 2    Renal/GU Renal disease  negative genitourinary   Musculoskeletal   Abdominal  (+) + obese  Peds  Hematology  (+) Blood dyscrasia, anemia   Anesthesia Other Findings Past Medical History: No date: Amputation of right great toe (HCC) No date: Cellulitis and abscess of foot No date: CKD stage 3a, GFR 45-59 ml/min (HCC) No date: Cocaine abuse (HCC)     Comment:  + UDS on 05-12-24 No date: DM (diabetes mellitus), type 2 (HCC) 04/18/2024: ESBL (extended spectrum beta-lactamase) producing  bacteria infection No date: Hyperlipidemia No date: Hypertension No date: MRSA bacteremia No date: Normocytic anemia No date: Obesity No date: Tobacco abuse No date: Toe osteomyelitis, left (HCC) No date: Tuberculosis     Comment:  tested positive,treated with medications.  Past Surgical History: 03/26/2024: AMPUTATION TOE; Right     Comment:  Procedure: AMPUTATION, TOE;  Surgeon: Harden Jerona GAILS,               MD;  Location: [facility] OR;  Service: Orthopedics;  Laterality:              Right;  RIGHT GREAT TOE AMPUTATION 04/18/2024: BONE BIOPSY     Comment:  Procedure: BIOPSY, BONE (2ND METATARSAL);  Surgeon:               Ashley Soulier, DPM;  Location: [facility] ORS;   Service:               Orthopedics/Podiatry;; No date: FOOT SURGERY; Left 05/05/2022: I & D EXTREMITY; Left     Comment:  Procedure: LEFT FOOT DEBRIDEMENT;  Surgeon: Harden Jerona GAILS, MD;  Location: [facility] OR;  Service: Orthopedics;                Laterality: Left; 06/05/2024: INCISION AND DRAINAGE OF WOUND; Right     Comment:  Procedure: DELAYED PRIMARY CLOSURE OF RIGHT FOOT WOUND,               EXCISION OF DISTAL METATARSAL 2, 3, 4, and 5;  Surgeon:               Ashley Soulier, DPM;  Location: [facility] ORS;  Service:               Orthopedics/Podiatry;  Laterality: Right; 04/18/2024: IRRIGATION AND DEBRIDEMENT FOOT; Right     Comment:  Procedure: IRRIGATION AND DEBRIDEMENT FOOT, PLACEMENT OF              ANTIBIOTIC BEADS;  Surgeon: Ashley Soulier, DPM;                Location: [facility] ORS;  Service: Orthopedics/Podiatry;  Laterality: Right; 06/29/2024: IRRIGATION AND DEBRIDEMENT FOOT; Left     Comment:  Procedure: IRRIGATION AND DEBRIDEMENT FOOT;  Surgeon:               Lennie Barter, DPM;  Location: [facility] ORS;  Service:               Orthopedics/Podiatry;  Laterality: Left; 07/20/2022: MYRINGOTOMY WITH TUBE PLACEMENT; Bilateral     Comment:  Procedure: MYRINGOTOMY WITH TUBE PLACEMENT;  Surgeon:               Juengel, Paul, MD;  [facility];                Service: ENT;  Laterality: Bilateral;  Diabetic No date: TONSILLECTOMY 04/23/2024: TRANSMETATARSAL AMPUTATION; Right     Comment:  Procedure: AMPUTATION, FOOT, TRANSMETATARSAL;  Surgeon:               Ashley Soulier, DPM;  Location: [facility];  Service:               Orthopedics/Podiatry;  Laterality: Right; 06/29/2024: TRANSMETATARSAL AMPUTATION; Right     Comment:  Procedure: AMPUTATION, FOOT, TRANSMETATARSAL;  Surgeon:               Lennie Barter, DPM;  Location: [facility] ORS;  Service:               Orthopedics/Podiatry;  Laterality: Right;  RIGHT               TRANSMETATARSAL AMPUTATION REVISION  BMI     Body Mass Index: 32.28 kg/m      Reproductive/Obstetrics negative OB ROS                              Anesthesia Physical Anesthesia Plan  ASA: 3  Anesthesia Plan: General   Post-op Pain Management: Regional block*   Induction: Intravenous  PONV Risk Score and Plan: Dexamethasone , Ondansetron  and Midazolam   Airway Management Planned: LMA  Additional Equipment:   Intra-op Plan:   Post-operative Plan: Extubation in OR  Informed Consent: I have reviewed the patients History and Physical, chart, labs and discussed the procedure including the risks, benefits and alternatives for the proposed anesthesia with the patient or authorized representative who has indicated his/her understanding and acceptance.     Dental Advisory Given  Plan Discussed with: Anesthesiologist, CRNA and Surgeon  Anesthesia Plan Comments:          Anesthesia Quick Evaluation

## 2024-10-17 NOTE — Op Note (Signed)
 FOOT AND ANKLE SURGERY  OPERATIVE REPORT  SURGEON: Scotti Kosta G. Jasenia Weilbacher, DPM  PRE-OPERATIVE DIAGNOSIS:  - Acute on chronic osteomyelitis, left foot.  - Diabetic foot infection, left foot  - Multiple full thickness neuropathic ulcerations with wide soft tissue defects to the left foot  POST-OPERATIVE DIAGNOSIS: Same  PROCEDURE(S): Transmetatarsal amputation with complex rotational flap for primary closure, left foot  HEMOSTASIS: Tourniquet  ANESTHESIA: General  ESTIMATED BLOOD LOSS: 30cc  FINDING(S): Observations of both acute and chronic bony changes.  The bone was noted to be soft/ necrotic with acute cortical erosive changes noted at the second metatarsal. The fifth metatarsal had hardened necrotic tissue to the adjacent along the metatarsal - as well as calcifications noted in the plantar lateral tissue.   2. Purulence noted at the second metatarsal along the metatarsophalangeal joint which was an area noted to communicate to the acute wound from the medial aspect of the second digit.   PATHOLOGY/SPECIMEN(S):   ID Type Source Tests Collected by Time Destination  1 : Distal specimen of left foot Tissue PATH Other SURGICAL PATHOLOGY Tanda Greig MATSU, DPM 10/17/2024 1841   A : Deep tissue wound swab left foot Tissue Foot, Left AEROBIC/ANAEROBIC CULTURE W GRAM STAIN (SURGICAL/DEEP WOUND) Tanda Greig MATSU, DPM 10/17/2024 1756   B : Bone culture of left foot Tissue Foot, Left AEROBIC/ANAEROBIC CULTURE W GRAM STAIN (SURGICAL/DEEP WOUND) Tanda Greig MATSU, DPM 10/17/2024 1847     PROCEDURE INDICATIONS:  [name] is a 58 y.o. male with a past medical history significant for significant DM2 c/b polyneuropathy (diabetic ulcerations s/p R TMA), CKD3a, HLD, HTN and cocaine abuse that presented with  acute on chronic wounds with concern of acute on chronic osteomyelitis of the left foot. With clinical evidence of acute infection and potential for further progression of infection complicated by multiple  open wound, both medical and surgical treatment options were presented to the patient, of which the patient elected to undergo surgical intervention of via distal transmetatarsal amputation with attempt at rotational flap coverage. The procedure, alternatives, risks, and limitations in this individual case have been carefully discussed with the patient. All questions have been thoroughly answered and the patient understands the surgery indicated. The patient has requested that this surgical intervention be undertaken and a consent form was signed.   PROCEDURAL DETAILS:  The patient was brought to the operating room and was transitioned to the operating room table in the supine position. A standard awake time-out was conducted to confirm the correct patient, procedure, sides, and sites. All team members were in agreement. Anesthesia team initiated general anesthesia and administered prophylactic antibiosis. The operative extremity was prepped with alcohol, and 10ml of 1:1 1% lidocaine  plain and 0.5% marcaine  plain local anesthetic was administered for a local ankle block. Tourniquet was placed and set to 250 mmHg. The extremity was then scrubbed, prepped and draped in the usual aseptic manner. Extremity was elevated / exsanguinated and tourniquet inflated.  Attention was drawn to the left foot. A full-thickness, horizontal semi-fishmouth incision was made along the dorsal the forefoot with incision site placement marked just distal to the level of metatarsophalangeal joints. The medial plantar incision incorporated the plantar hallux skin to rotate into proximal lateral directed to assist with defect closure for the central plantar forefoot wound. The rest of the plantar horizontal fishmouth incision was carried as distal as possible to in order to determine if plantar flap coverage is salvageable. These flap incisions were carried directly down to bone.  All purposeful measures to reduce skin and tissue  trauma were taken intraoperatively to permit best chance of flap survival. Planned transmetatarsal osteotomy cuts was made and careful, sharp dissection of the adjacent metatarsal tissues was conducted to facilitate osteotomy cuts. A sagittal saw was then used to create the transmetatarsal parabola.   The bone was noted to be soft/ necrotic with acute cortical erosive changes noted at the second metatarsal. Mild purulence noted at the second metatarsal along the metatarsophalangeal joint which was an area noted to communicate to the acute wound from the medial aspect of the second digit. A tissue wound culture was obtained from this section of the foot and swab was collected for evaluation. The fifth metatarsal had hardened necrotic tissue to the adjacent along the metatarsal - as well as calcifications noted in the plantar lateral tissue. The soft tissue was then reflected from the distal forefoot and distal forefoot specimen was handed to back table for pathology review. The surgical wound was then evaluated and all nonviable, necrotic tissue was excised via sharp and blunt dissection using a combination of blade and rongeur. Nonfunctioning tendons associated with the amputated forefoot were then tensioned and transected at most proximal point visualized. An elliptical incision was planned encompassing the plantar lateral wound along the proximal fifth metatarsal shaft ; a full thickness incision was made to excise out this wound and close via linear incision. The central plantar wound was excised in total. All tissue excised that included the wound borders was hypertrophic and noted to have fragmentation of calcific tissue - but appeared absent of gross infection and perfused. The remaining total soft tissue appeared to be of healthy, viable quality; absent of acute gross infection.  The surgical site was then irrigated with 3L of normal saline via pulse lavage. Top gloves were replaced. All incisions were  closed with a mixture of horizontal and simple pattern of 3-0 nylon. The lateral and medial aspect of the wound was closed. The tourniquet was released and flaps evaluated there was noted to be perfusion to the surgical site flaps without pulsatile bleeding. The medial hallux flap was noted to have good perfusion and was rotated in plantar lateral proximal orientation to close with the dorsal aspect of the incision. The plantar lateral portion of the flap continued to bridge inward in proximal medial direction to assist with removal of any tension of the central wound. The central wound defect and plantar lateral wound were closed. Top gloves were removed and base dressings were applied consisting of betadine  ointment, 4x4 gauze, ABD pads, kerlix and 4 inch ACE bandage.   The foot appeared to have consistent vascular status postoperatively as noted in preoperative setting; perfusion was noted at surgical margins. Overall, the patient tolerated the procedure and anesthesia well. They were transported to the recovery room with vital signs stable and vascular status intact to the operative foot. Once the patient  was deemed appropriate by the recovery room staff, the patient was transferred to the floor in stable condition for continued monitoring and placement for acute rehab with the following physical and verbal post-operative instructions (below).   POST-OPERATIVE PLAN: Activity: NWB to LLE. Limit excessive dependency to the operative extremity. Elevate as much as possible until your post-operative appointment.  Wound care: Keep dressings clean dry and intact. Will re-evaluate on the floor - if excessive maceration may consider daily dry dressings, but currently podiatry to change dressing for first post operative change.

## 2024-10-17 NOTE — Interval H&P Note (Signed)
  HISTORY AND PHYSICAL INTERVAL NOTE:    [name]  has presented today for surgery, with the diagnosis of left foot prior nonhealing transmetatarsal amputation site, chronic left foot ulcerations, uncontrolled DM2 with polyneuropathy.  The various methods of treatment have been discussed with the patient.  No guarantees were given.  After consideration of risks, benefits and other options for treatment, the patient has consented to surgery.  I have reviewed the patients' chart and labs.     Procedure: Left foot transmetatarsal amputation with rotational flap closure possible wound vac placement.      A history and physical examination was performed in the hospital.  The patient was reexamined.  There have been no changes to this history and physical examination.

## 2024-10-17 NOTE — Progress Notes (Signed)
 Mobility Specialist - Progress Note   10/17/24 1200  Mobility  Activity Ambulated with assistance;Stood at bedside  Level of Assistance Standby assist, set-up cues, supervision of patient - no hands on  Assistive Device Front wheel walker  Distance Ambulated (ft) 360 ft  Range of Motion/Exercises Active  Activity Response Tolerated well  Mobility visit 1 Mobility  Mobility Specialist Start Time (ACUTE ONLY) 1130  Mobility Specialist Stop Time (ACUTE ONLY) 1200  Mobility Specialist Time Calculation (min) (ACUTE ONLY) 30 min   Pt was supine in bed on RA upon entry. Pt agreed to mobility. Pt is able to get to the EOB independently with bed features. Pt is able to put on boot for mobility. Pt is able to get to STS independently with 2 WW. Pt ambulated well. Pt didn't need recovery breaks throughout activity. After activity pt returned to the room at the EOB with all needs in reach.  [name] Mobility Specialist 10/17/24, 12:28 PM

## 2024-10-17 NOTE — Plan of Care (Signed)
 Plan of care

## 2024-10-17 NOTE — Brief Op Note (Addendum)
  FOOT AND ANKLE SURGERY  BRIEF OPERATIVE REPORT   SURGEON: Mayela Bullard G. Tanda, DPM   PRE-OPERATIVE DIAGNOSIS:  1.  Acute on chronic osteomyelitis, left foot 2.  Type 2 diabetes with polyneuropathy   POST-OPERATIVE DIAGNOSIS: Same   PROCEDURE(S): Left foot transmetatarsal amputation with complex rotational flap closure.    HEMOSTASIS: Tourniquet   ANESTHESIA: regional + local    ESTIMATED BLOOD LOSS: 30 cc     PATHOLOGY/SPECIMEN(S):   ID Type Source Tests Collected by Time Destination  1 : Distal specimen of left foot Tissue PATH Other SURGICAL PATHOLOGY Tanda Greig MATSU, DPM 10/17/2024 1841    A : Deep tissue wound swab left foot Tissue Foot, Left AEROBIC/ANAEROBIC CULTURE W GRAM STAIN (SURGICAL/DEEP WOUND) Tanda Greig MATSU, DPM 10/17/2024 1756    B : Bone culture of left foot Tissue Foot, Left AEROBIC/ANAEROBIC CULTURE W GRAM STAIN (SURGICAL/DEEP WOUND) Tanda Greig MATSU, DPM 10/17/2024 1847     Complications: None  Dispo: PACU stable

## 2024-10-17 NOTE — Anesthesia Procedure Notes (Signed)
 Procedure Name: LMA Insertion Date/Time: 10/17/2024 5:33 PM  Performed by: Leontine Katz, CRNAPre-anesthesia Checklist: Patient identified, Patient being monitored, Timeout performed, Emergency Drugs available and Suction available Patient Re-evaluated:Patient Re-evaluated prior to induction Oxygen  Delivery Method: Circle system utilized Preoxygenation: Pre-oxygenation with 100% oxygen  Induction Type: IV induction Ventilation: Mask ventilation without difficulty LMA: LMA inserted LMA Size: 4.0 Tube type: Oral Number of attempts: 1 Placement Confirmation: positive ETCO2 and breath sounds checked- equal and bilateral Tube secured with: Tape Dental Injury: Teeth and Oropharynx as per pre-operative assessment

## 2024-10-17 NOTE — Transfer of Care (Signed)
 Immediate Anesthesia Transfer of Care Note  Patient: [name]  Procedure(s) Performed: AMPUTATION, FOOT, TRANSMETATARSAL (Left: Toe)  Patient Location: PACU  Anesthesia Type:General  Level of Consciousness: sedated  Airway & Oxygen  Therapy: Patient Spontanous Breathing and Patient connected to face mask oxygen   Post-op Assessment: Report given to RN and Post -op Vital signs reviewed and stable  Post vital signs: Reviewed  Last Vitals:  Vitals Value Taken Time  BP 139/77   Temp 98.69f   Pulse 95 10/17/24 20:16  Resp 10 10/17/24 20:16  SpO2 100 % 10/17/24 20:16  Vitals shown include unfiled device data.  Last Pain:         Complications: No notable events documented.

## 2024-10-18 DIAGNOSIS — L97529 Non-pressure chronic ulcer of other part of left foot with unspecified severity: Secondary | ICD-10-CM | POA: Diagnosis not present

## 2024-10-18 LAB — BASIC METABOLIC PANEL WITH GFR
Anion gap: 7 (ref 5–15)
Anion gap: 8 (ref 5–15)
BUN: 27 mg/dL — ABNORMAL HIGH (ref 6–20)
BUN: 26 mg/dL — ABNORMAL HIGH (ref 6–20)
CO2: 25 mmol/L (ref 22–32)
CO2: 23 mmol/L (ref 22–32)
Calcium: 8.3 mg/dL — ABNORMAL LOW (ref 8.9–10.3)
Calcium: 8.3 mg/dL — ABNORMAL LOW (ref 8.9–10.3)
Chloride: 97 mmol/L — ABNORMAL LOW (ref 98–111)
Chloride: 97 mmol/L — ABNORMAL LOW (ref 98–111)
Creatinine, Ser: 1.35 mg/dL — ABNORMAL HIGH (ref 0.61–1.24)
Creatinine, Ser: 1.2 mg/dL (ref 0.61–1.24)
GFR, Estimated: >60 mL/min (ref >60)
GFR, Estimated: >60 mL/min (ref >60)
Glucose, Bld: 447 mg/dL — ABNORMAL HIGH (ref 70–99)
Glucose, Bld: 327 mg/dL — ABNORMAL HIGH (ref 70–99)
Potassium: 5.6 mmol/L — ABNORMAL HIGH (ref 3.5–5.1)
Potassium: 5.1 mmol/L (ref 3.5–5.1)
Sodium: 128 mmol/L — ABNORMAL LOW (ref 135–145)
Sodium: 129 mmol/L — ABNORMAL LOW (ref 135–145)

## 2024-10-18 LAB — CBC
HCT: 27.6 % — ABNORMAL LOW (ref 39.0–52.0)
Hemoglobin: 8.4 g/dL — ABNORMAL LOW (ref 13.0–17.0)
MCH: 24 pg — ABNORMAL LOW (ref 26.0–34.0)
MCHC: 30.4 g/dL (ref 30.0–36.0)
MCV: 78.9 fL — ABNORMAL LOW (ref 80.0–100.0)
Platelets: 591 K/uL — ABNORMAL HIGH (ref 150–400)
RBC: 3.5 MIL/uL — ABNORMAL LOW (ref 4.22–5.81)
RDW: 14.6 % (ref 11.5–15.5)
WBC: 10.7 K/uL — ABNORMAL HIGH (ref 4.0–10.5)
nRBC: 0 % (ref 0.0–0.2)

## 2024-10-18 LAB — MAGNESIUM: Magnesium: 2.1 mg/dL (ref 1.7–2.4)

## 2024-10-18 LAB — GLUCOSE, CAPILLARY
Glucose-Capillary: 428 mg/dL — ABNORMAL HIGH (ref 70–99)
Glucose-Capillary: 334 mg/dL — ABNORMAL HIGH (ref 70–99)
Glucose-Capillary: 408 mg/dL — ABNORMAL HIGH (ref 70–99)
Glucose-Capillary: 347 mg/dL — ABNORMAL HIGH (ref 70–99)

## 2024-10-18 MED ORDER — OXYCODONE HCL 5 MG PO TABS: 5.0000 mg | ORAL_TABLET | ORAL | Status: DC | PRN

## 2024-10-18 MED ORDER — OXYCODONE-ACETAMINOPHEN 5-325 MG PO TABS
1.0000 | ORAL_TABLET | ORAL | Status: DC | PRN
  Administered 2024-10-20: 1 via ORAL
  Filled 2024-10-18: qty 1

## 2024-10-18 MED ORDER — OXYCODONE HCL 5 MG PO TABS: 10.0000 mg | ORAL_TABLET | ORAL | Status: DC | PRN

## 2024-10-18 MED ORDER — OXYCODONE-ACETAMINOPHEN 5-325 MG PO TABS
2.0000 | ORAL_TABLET | ORAL | Status: DC | PRN
Start: 2024-10-18 — End: 2024-10-24
  Administered 2024-10-18 – 2024-10-23 (×24): 2 via ORAL
  Filled 2024-10-18 (×24): qty 2

## 2024-10-18 MED ORDER — INSULIN GLARGINE-YFGN 100 UNIT/ML ~~LOC~~ SOLN
35.0000 [IU] | Freq: Every day | SUBCUTANEOUS | Status: DC
  Administered 2024-10-18: 35 [IU] via SUBCUTANEOUS
  Filled 2024-10-18: qty 0.35

## 2024-10-18 MED ORDER — LACTATED RINGERS IV BOLUS
1000.0000 mL | Freq: Once | INTRAVENOUS | Status: AC
  Administered 2024-10-18: 1000 mL via INTRAVENOUS

## 2024-10-18 MED ORDER — INSULIN ASPART 100 UNIT/ML IJ SOLN
7.0000 [IU] | Freq: Three times a day (TID) | INTRAMUSCULAR | Status: DC
  Administered 2024-10-18 – 2024-10-19 (×2): 7 [IU] via SUBCUTANEOUS
  Filled 2024-10-18 (×2): qty 7

## 2024-10-18 MED ORDER — ACETAMINOPHEN 325 MG PO TABS: 650.0000 mg | ORAL_TABLET | Freq: Four times a day (QID) | ORAL | Status: DC | PRN

## 2024-10-18 NOTE — Progress Notes (Signed)
 Mobility Specialist - Progress Note    10/18/24 1600  Mobility  Activity Stood at bedside;Placed arms in swimmer's position - left arm up;Placed arms in swimmer's position - right arm up  Level of Assistance Independent after set-up  Assistive Device None  Distance Ambulated (ft) 3 ft  Range of Motion/Exercises Active  Activity Response Tolerated well  Mobility visit 1 Mobility  Mobility Specialist Start Time (ACUTE ONLY) 1547  Mobility Specialist Stop Time (ACUTE ONLY) 1551  Mobility Specialist Time Calculation (min) (ACUTE ONLY) 4 min   Pt was at the EOB on RA upon entry. Pt agreed to mobility. Pt today is able to reposition the the recliner independently. Pt was able today to activate UE. Pt After activities repositioned in the recliner with all needs in reach. Nurse in the room upon exit.  Clem Rodes Mobility Specialist 10/18/24, 4:25 PM

## 2024-10-18 NOTE — Progress Notes (Signed)
 Progress Note   Patient: [name] FMW:[id number] DOB: 08/03/1966 DOA: 10/15/2024     3 DOS: the patient was seen and examined on 10/18/2024   Brief hospital course: Per H&P HPI   HPI: [name] is a 58 y.o. 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      Patient has had pain in his right knee for at least the past 3 weeks.  He has had some falls due to his knee giving out .  He presented to [facility] 10/24.  MRI obtained at that time was inconclusive for osteomyelitis.  An arthrocentesis was attempted but no synovial fluid was able to be collected 1024.  Infectious disease was consulted and had low suspicion for septic arthritis.    He followed up with infectious disease 10/29.  They recommended given patient's persistent pain and concern for possible septic joint and a follow-up with orthopedic team for arthrocentesis. He underwent arthrocentesis which showed no crystals and 24K WBC with neutrophilic predominance. He was scheduled to follow up 11/13 for further treatment.  However he continued to have pain in his right knee.  He presented to the ED 11/8.  Repeat arthrocentesis was performed.  Synovial fluid analysis again showed white blood cells in the 20,000 range.  Synovial fluid cultures were sent which are no growth to date.  He had no crystals. He was again discharged with pain medication.    Patient presents again with swelling and pain in his R knee. He has no systemic symptoms of infection. He denies dyspnea, cough, diarrhea, dysuria.      Of note, patient was seen by podiatry team 11/10.  It was recommended at that time that patient presented to the ED for further management of his chronic left foot ulcers.  Patient has no pain from these.  During a previous hospitalization it was thought that patient had foul-smelling purulent  drainage from these.  The left foot showed chronic osteomyelitis during hospitalization 10/24.  He has completed antibiotics for this since that time.  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 Will discuss with podiatry if antibiotics warranted.     T2DM A1c 6.7 09/2024. Currently takes 22u of NPH twice daily as well as metformin .  CBG (last 3)  Recent Labs    10/18/24 0815 10/18/24 1137 10/18/24 1646  GLUCAP 428* 334* 408*   BG poorly controlled.  Increase lantus  to 35u nightly and increase meal time insulin  to 7u TID.      HTN Normotensive. Will hold on resuming antihypertensives at this time.     10/18/2024    3:52 PM 10/18/2024    3:34 AM 10/17/2024    9:03 PM  Vitals with BMI  Systolic 114 127 859  Diastolic 74 64 74  Pulse 90 98 92    AKI Improving. Worsened this a.m.  Improved with IV fluids Continue to monitor      Latest Ref Rng & Units 10/18/2024   12:38 PM 10/18/2024    7:57 AM 10/17/2024    8:20 AM  BMP  Glucose 70 - 99 mg/dL 672  552  822   BUN 6 - 20 mg/dL 26  27  17    Creatinine 0.61 - 1.24 mg/dL 8.79  8.64  8.89   Sodium  135 - 145 mmol/L 129  128  134   Potassium 3.5 - 5.1 mmol/L 5.1  5.6  4.7   Chloride 98 - 111 mmol/L 97  97  98   CO2 22 - 32 mmol/L 23  25  28    Calcium  8.9 - 10.3 mg/dL 8.3  8.3  8.6       Normocytic anemia  Hgb stable. Iron  deficiency noted on labs ~9 months ago. Will need further work up for this outpatient.  -Continues to have Fe deficiency  -Continue PO iron     Hyperkalemia Resolved      Hyponatremia  In the setting of elevated glucose    HLD LDL 62 5 months ago.    Cocaine use Encourage cessation  R knee effusion Multiple recent arthrocenteses. Low concern for septic arthritis. Improved with acetaminophen  and oxycodone . Continue pain control, will need to see outpatient orthopedic team.       Subjective: No issues on.    Physical Exam: Vitals:   10/17/24 2045 10/17/24 2103 10/18/24 0334 10/18/24 1552  BP: 123/74 (!) 140/74 127/64 114/74  Pulse: 97 92 98 90  Resp: (!) 22 19 16 17   Temp:  98.8 F (37.1 C) 99.2 F (37.3 C) 98 F (36.7 C)  TempSrc:   Oral   SpO2: 96% 97% 98% 94%  Weight:      Height:         Constitutional: In no distress.  Cardiovascular: Normal rate, regular rhythm. No lower extremity edema  Pulmonary: Non labored breathing on room air, no wheezing or rales.   Abdominal: Soft. Non distended and non tender Musculoskeletal: s/p R TMA, L foot bandaged s/p TMA  Neurological: Alert and oriented to person, place, and time. Non focal  Skin: Skin is warm and dry.    Data Reviewed:     Latest Ref Rng & Units 10/18/2024    7:57 AM 10/17/2024    4:16 AM 10/16/2024    6:44 AM  CBC  WBC 4.0 - 10.5 K/uL 10.7  7.4  6.7   Hemoglobin 13.0 - 17.0 g/dL 8.4  8.0  8.2   Hematocrit 39.0 - 52.0 % 27.6  26.1  26.5   Platelets 150 - 400 K/uL 591  523  573       Latest Ref Rng & Units 10/18/2024   12:38 PM 10/18/2024    7:57 AM 10/17/2024    8:20 AM  BMP  Glucose 70 - 99 mg/dL 672  552  822   BUN 6 - 20 mg/dL 26  27  17    Creatinine 0.61 - 1.24 mg/dL 8.79  8.64  8.89   Sodium 135 - 145 mmol/L 129  128  134   Potassium 3.5 - 5.1 mmol/L 5.1  5.6  4.7   Chloride 98 - 111 mmol/L 97  97  98   CO2 22 - 32 mmol/L 23  25  28    Calcium  8.9 - 10.3 mg/dL 8.3  8.3  8.6      Family Communication: None at bedside.   Disposition: Status is: Inpatient Remains inpatient appropriate because: pending operation   Planned Discharge Destination: Pending PT/OT     Time spent: 35 minutes  Author: Alban Pepper, MD 10/18/2024 5:46 PM  For on call review [email or web address].

## 2024-10-18 NOTE — Plan of Care (Signed)
  Problem: Skin Integrity: Goal: Risk for impaired skin integrity will decrease Outcome: Progressing   Problem: Pain Managment: Goal: General experience of comfort will improve and/or be controlled Outcome: Progressing   Problem: Safety: Goal: Ability to remain free from injury will improve Outcome: Progressing   Problem: Elimination: Goal: Will not experience complications related to bowel motility Outcome: Progressing

## 2024-10-18 NOTE — Progress Notes (Incomplete)
 Patients Potassium resulted 5.6; provider notified via secure chat with intervention of Lactated Ringer  bolus + recently given insulin  coverage

## 2024-10-18 NOTE — Evaluation (Addendum)
 Occupational Therapy Evaluation Patient Details Name: [name] MRN: [id number] DOB: Jan 15, 1966 Today's Date: 10/18/2024   History of Present Illness   Pt is a 58 y/o M admitted on 10/15/24 after presenting with R knee pain & L foot wounds. 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 Pt is s/p L foot transmet amputation on 10/17/24 by podiatry. PMH: IDDM2, CKD 3A, obesity, HLD, HTN, cocaine abuse, RLE TMA     Clinical Impressions Pt. presents with weakness, NWB through the left LE, 7/10 pain in the LLE, 15/10 pain in the right knee, limited activity tolerance, and limited functional mobility which limits his ability to complete basic ADL and IADL functioning. Pt. resides at home alone. Pt. was independent with ADLs, and IADL functioning: including meal preparation using a w/c, and medication management. Pt. does not currently drive. Pt. reports that his brother took his keys away for the time being, and provides transportation for the Pt. Pt. previously worked as a naval architect for long distance routes to [place] . Pt. is independent with UE ADLs, and requires MinA LEADLs. Pt. requires verbal cues for NWB status on the left. Pt. will  benefit from OT services for ADL training, A/E training, UE there. Ex. and Pt./caregiver education about home modification, and DME.      If plan is discharge home, recommend the following:   A little help with walking and/or transfers;A little help with bathing/dressing/bathroom;Assistance with cooking/housework     Functional Status Assessment   Patient has had a recent decline in their functional status and demonstrates the ability to make significant improvements in function in a reasonable and predictable amount of time.     Equipment Recommendations   None recommended by OT     Recommendations for Other Services         Precautions/Restrictions   Restrictions Weight Bearing Restrictions  Per Provider Order: No LLE Weight Bearing Per Provider Order: Non weight bearing (In post op shoe)     Mobility Bed Mobility               General bed mobility comments: Pt. up in recliner chair upon arrival    Transfers Overall transfer level: Needs assistance Equipment used: Rolling walker (2 wheels) Transfers: Sit to/from Stand, Bed to chair/wheelchair/BSC Sit to Stand: Contact guard assist     Step pivot transfers: Contact guard assist     General transfer comment: Cues for Left NWB      Balance     Sitting balance-Leahy Scale: Good       Standing balance-Leahy Scale: Fair                             ADL either performed or assessed with clinical judgement   ADL Overall ADL's : Needs assistance/impaired                                       General ADL Comments: Independent UEs, MinA LE ADLs.     Vision Patient Visual Report: No change from baseline       Perception         Praxis         Pertinent Vitals/Pain Pain Assessment Pain Assessment: 0-10 Pain Score: 7  (15/10 Right knee) Pain Location: Left foot, 15/10 right knee Pain Intervention(s): Monitored during session,  Limited activity within patient's tolerance     Extremity/Trunk Assessment Upper Extremity Assessment Upper Extremity Assessment: Generalized weakness (Tingling in bilateral hands)   Lower Extremity Assessment Lower Extremity Assessment: RLE deficits/detail;LLE deficits/detail RLE Deficits / Details: hx of RLE transmet amputation LLE Deficits / Details: new left transmet amputation in ace wrappings   Cervical / Trunk Assessment Cervical / Trunk Assessment: Normal   Communication Communication Communication: No apparent difficulties   Cognition Arousal: Alert Behavior During Therapy: WFL for tasks assessed/performed                                 Following commands: Intact       Cueing  General Comments   Cueing  Techniques: Verbal cues      Exercises     Shoulder Instructions      Home Living Family/patient expects to be discharged to:: Private residence Living Arrangements: Alone Available Help at Discharge: Family;Available PRN/intermittently Type of Home: Apartment Home Access: Stairs to enter Entrance Stairs-Number of Steps: 1   Home Layout: One level     Bathroom Shower/Tub: Chief Strategy Officer: Standard     Home Equipment: Agricultural Consultant (2 wheels);Wheelchair - manual;Shower seat          Prior Functioning/Environment Prior Level of Function : Independent/Modified Independent             Mobility Comments: Pt reports he primarily uses w/c but has to ambulate into bathroom ADLs Comments: Independent with ADLs/IADLs, uses a w/c when preparing meals    OT Problem List:     OT Treatment/Interventions: Self-care/ADL training;Therapeutic exercise;Neuromuscular education;DME and/or AE instruction;Therapeutic activities      OT Goals(Current goals can be found in the care plan section)   Acute Rehab OT Goals Patient Stated Goal: To go to rehab prior to returing home OT Goal Formulation: With patient Time For Goal Achievement: 11/01/24 Potential to Achieve Goals: Good ADL Goals Pt Will Perform Grooming: Independently Pt Will Perform Upper Body Dressing: Independently Pt Will Perform Lower Body Dressing: with modified independence Pt Will Transfer to Toilet: with modified independence   OT Frequency:  Min 2X/week    Co-evaluation              AM-PAC OT 6 Clicks Daily Activity     Outcome Measure Help from another person eating meals?: None Help from another person taking care of personal grooming?: A Little Help from another person toileting, which includes using toliet, bedpan, or urinal?: None Help from another person bathing (including washing, rinsing, drying)?: A Little Help from another person to put on and taking off regular upper  body clothing?: None Help from another person to put on and taking off regular lower body clothing?: A Little 6 Click Score: 21   End of Session    Activity Tolerance: Patient tolerated treatment well Patient left: in chair;with call bell/phone within reach;with chair alarm set  OT Visit Diagnosis: Muscle weakness (generalized) (M62.81)                Time: 1455-1520 OT Time Calculation (min): 25 min Charges:  OT General Charges $OT Visit: 1 Visit OT Evaluation $OT Eval Moderate Complexity: 1 Mod Richardson Otter, MS, OTR/L  Richardson Otter 10/18/2024, 3:49 PM

## 2024-10-18 NOTE — Anesthesia Postprocedure Evaluation (Signed)
 Anesthesia Post Note  Patient: [name]  Procedure(s) Performed: AMPUTATION, FOOT, TRANSMETATARSAL (Left: Toe)  Patient location during evaluation: PACU Anesthesia Type: General Level of consciousness: awake and alert Pain management: pain level controlled Vital Signs Assessment: post-procedure vital signs reviewed and stable Respiratory status: spontaneous breathing, nonlabored ventilation and respiratory function stable Cardiovascular status: blood pressure returned to baseline and stable Postop Assessment: no apparent nausea or vomiting Anesthetic complications: no   No notable events documented.   Last Vitals:  Vitals:   10/17/24 2045 10/17/24 2103  BP: 123/74 (!) 140/74  Pulse: 97 92  Resp: (!) 22 19  Temp:  37.1 C  SpO2: 96% 97%    Last Pain:  Vitals:   10/18/24 0014  TempSrc:   PainSc: 6                  Camellia Merilee Louder

## 2024-10-18 NOTE — Evaluation (Signed)
 Physical Therapy Evaluation Patient Details Name: [name] MRN: [id number] DOB: [birth year] Today's Date: 10/18/2024  History of Present Illness  Pt is a 58 y/o M admitted on 10/15/24 after presenting with R knee pain & L foot wounds. 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 Pt is s/p L foot transmet amputation on 10/17/24 by podiatry. PMH: IDDM2, CKD 3A, obesity, HLD, HTN, cocaine abuse, RLE TMA  Clinical Impression  Pt seen for PT evaluation with pt agreeable. Pt reports he's aware of NWB RLE. Pt reports prior to admission he was living alone in a 1 level apartment with 1 step to enter, primarily uses a w/c but has to walk in to his bathroom. On this date, pt is able to don BLE post op shoes sitting EOB without LOB. Pt has difficulty maintaining NWB LLE during sit>Stand transfers but does well during standing, gait. Pt is able to ambulate short distance with RW & min assist but poor foot clearance RLE. Pt requesting to go to rehab upon d/c & at this time, this PT feels it's appropriate as pt currently requires 1 assist to ambulate & he has to ambulate into his bathroom, & he has 1 step to enter his apartment. Will continue to follow pt acutely to progress mobility as able.        If plan is discharge home, recommend the following: A little help with walking and/or transfers;A little help with bathing/dressing/bathroom;Assist for transportation;Assistance with cooking/housework;Help with stairs or ramp for entrance   Can travel by private vehicle   Yes    Equipment Recommendations  (TBD in next venue)  Recommendations for Other Services  Rehab consult    Functional Status Assessment Patient has had a recent decline in their functional status and demonstrates the ability to make significant improvements in function in a reasonable and predictable amount of time.     Precautions / Restrictions Precautions Precautions:  Fall Restrictions Weight Bearing Restrictions Per Provider Order: No LLE Weight Bearing Per Provider Order: Non weight bearing (in post op shoe) Other Position/Activity Restrictions: precautions via secure chat with podiatry DM      Mobility  Bed Mobility Overal bed mobility: Modified Independent Bed Mobility: Supine to Sit     Supine to sit: Modified independent (Device/Increase time), HOB elevated, Used rails (exit L side of bed)          Transfers Overall transfer level: Needs assistance Equipment used: Rolling walker (2 wheels) Transfers: Sit to/from Stand, Bed to chair/wheelchair/BSC Sit to Stand: Contact guard assist (decreased ability to maintain NWB LLE during transfer but once standing, keeps LLE off floor)   Step pivot transfers: Contact guard assist (bed>recliner on L with RW, pt scoots R foot but eventually progresses to hopping on RLE with cuing)       General transfer comment: education re: hand placement to push to standing    Ambulation/Gait Ambulation/Gait assistance: Min assist Gait Distance (Feet): 11 Feet Assistive device: Rolling walker (2 wheels) Gait Pattern/deviations: Decreased step length - right, Decreased stride length, Decreased dorsiflexion - right Gait velocity: decreased     General Gait Details: Pt ambulates in room with RW & min assist with progressively decreasing RLE foot clearance, albeit poor clearance overall. Pt with absent RLE dorsiflexion & heel strike, instead, sliding R foot to advance it with poor foot clearance on floor.  Stairs            Psychologist, [facility]  Tilt Bed    Modified Rankin (Stroke Patients Only)       Balance Overall balance assessment: Needs assistance Sitting-balance support: Feet supported Sitting balance-Leahy Scale: Good Sitting balance - Comments: able to don BLE post op shoes sitting EOB without LOB   Standing balance support: Bilateral upper extremity supported, During functional  activity, Reliant on assistive device for balance Standing balance-Leahy Scale: Fair                               Pertinent Vitals/Pain Pain Assessment Pain Assessment: Faces Faces Pain Scale: Hurts little more Pain Location: R knee Pain Descriptors / Indicators: Discomfort Pain Intervention(s): Monitored during session, Limited activity within patient's tolerance    Home Living Family/patient expects to be discharged to:: Private residence Living Arrangements: Alone Available Help at Discharge: Family;Available PRN/intermittently Type of Home: Apartment Home Access: Stairs to enter   Entrance Stairs-Number of Steps: 1   Home Layout: One level Home Equipment: Agricultural Consultant (2 wheels);Wheelchair - manual      Prior Function               Mobility Comments: Pt reports he primarily uses w/c but has to ambulate into bathroom ADLs Comments: sits in w/c for meal prep     Extremity/Trunk Assessment   Upper Extremity Assessment Upper Extremity Assessment: Overall WFL for tasks assessed    Lower Extremity Assessment Lower Extremity Assessment: RLE deficits/detail;LLE deficits/detail RLE Deficits / Details: hx of RLE transmet amputation LLE Deficits / Details: new left transmet amputation in ace wrappings    Cervical / Trunk Assessment Cervical / Trunk Assessment: Normal  Communication   Communication Communication: No apparent difficulties    Cognition Arousal: Alert Behavior During Therapy: WFL for tasks assessed/performed   PT - Cognitive impairments: No apparent impairments                         Following commands: Intact       Cueing Cueing Techniques: Verbal cues     General Comments      Exercises     Assessment/Plan    PT Assessment Patient needs continued PT services  PT Problem List Decreased strength;Decreased balance;Decreased activity tolerance;Decreased mobility;Decreased knowledge of precautions;Decreased  safety awareness;Decreased knowledge of use of DME;Decreased skin integrity       PT Treatment Interventions DME instruction;Balance training;Gait training;Neuromuscular re-education;Stair training;Functional mobility training;Patient/family education;Therapeutic activities;Manual techniques;Therapeutic exercise    PT Goals (Current goals can be found in the Care Plan section)  Acute Rehab PT Goals Patient Stated Goal: go to rehab PT Goal Formulation: With patient Time For Goal Achievement: 11/01/24 Potential to Achieve Goals: Good    Frequency 7X/week     Co-evaluation               AM-PAC PT 6 Clicks Mobility  Outcome Measure Help needed turning from your back to your side while in a flat bed without using bedrails?: None Help needed moving from lying on your back to sitting on the side of a flat bed without using bedrails?: A Little Help needed moving to and from a bed to a chair (including a wheelchair)?: A Little Help needed standing up from a chair using your arms (e.g., wheelchair or bedside chair)?: A Little Help needed to walk in hospital room?: A Little Help needed climbing 3-5 steps with a railing? : A Lot 6 Click Score: 18  End of Session   Activity Tolerance: Patient tolerated treatment well Patient left: in chair;with chair alarm set;with call bell/phone within reach (set up with lunch tray) Nurse Communication: Mobility status PT Visit Diagnosis: Other abnormalities of gait and mobility (R26.89);Difficulty in walking, not elsewhere classified (R26.2);Muscle weakness (generalized) (M62.81)    Time: 8678-8667 PT Time Calculation (min) (ACUTE ONLY): 11 min   Charges:   PT Evaluation $PT Eval Low Complexity: 1 Low   PT General Charges $$ ACUTE PT VISIT: 1 Visit         Richerd Pinal, PT, DPT 10/18/24, 1:42 PM   Richerd CHRISTELLA Pinal 10/18/2024, 1:40 PM

## 2024-10-18 NOTE — Progress Notes (Signed)
 PODIATRY: PROGRESS NOTE    Surgery:  Procedure(s) (LRB): AMPUTATION, FOOT, TRANSMETATARSAL (Left) POD:  1 Day Post-Op  O/N: NAEON  Subjective:  Patient resting comfortably at bedside.  Patient communicated he would like to put in request for bed to [facility] / facility in [place].  Has not had BM yet.  Denies F/C/N/V/SOB/CP. Denies acute calf pain.    PHYSICAL EXAMINATION: BP 127/64 (BP Location: Left Arm)   Pulse 98   Temp 99.2 F (37.3 C) (Oral)   Resp 16   Ht 5' 9 (1.753 m)   Wt 104.3 kg   SpO2 98%   BMI 33.96 kg/m ? GEN: NAD. AOX3. ? RESP: Non-labored breathing on RA.? ABD: NT/ND of all four quadrants.? NEURO: Moving all four extremities spontaneously. ? ? FOCUSED LOWER EXTREMITY EXAMINATION:    Neurological:  - Protective sensation diminished / absent - Gross protective sensation diminished bilaterally.  - No focal motor or sensory deficits identified bilaterally.    Vascular: LEFT - Dorsalis Pedis artery on palpation: 2 - Posterior Tibial artery on palpation: 1 - Capillary Filling Times: sluggish - Peripheral edema: localized to distal L forefoot   Musculoskeletal:  Muscle strength: 5/5 in all 4 quadrants  Ankle Joint ROM: decreased, equinus noted Global foot - TTP: none noted. S/p R TMA  S/p L TMA  - No calf tenderness. ? - Negative Homan's maneuver.  ??  DERM: ? - Dressings c/d/I. ? - Surgical site well coapted. ? - No clinical signs of dehiscence noted. - No proximal streaking. No malodor. ? - No clinical signs of infection noted. ??         ?? Imaging: ?  Results for orders placed or performed during the hospital encounter of 10/15/24  Blood culture (routine x 2)     Status: None (Preliminary result)   Collection Time: 10/15/24  3:01 PM   Specimen: BLOOD  Result Value Ref Range Status   Specimen Description BLOOD BLOOD LEFT FOREARM  Final   Special Requests   Final    BOTTLES DRAWN AEROBIC AND ANAEROBIC Blood Culture  adequate volume   Culture   Final    NO GROWTH 3 DAYS Performed at [facility], [address], [place], [place] [address]    Report Status PENDING  Incomplete  Blood culture (routine x 2)     Status: None (Preliminary result)   Collection Time: 10/15/24  3:01 PM   Specimen: BLOOD  Result Value Ref Range Status   Specimen Description BLOOD BLOOD LEFT ARM  Final   Special Requests   Final    BOTTLES DRAWN AEROBIC AND ANAEROBIC Blood Culture adequate volume   Culture   Final    NO GROWTH 3 DAYS Performed at [facility], [address] [place], [place] [address]    Report Status PENDING  Incomplete    ID Type Source Tests Collected by Time Destination  1 : Distal specimen of left foot Tissue PATH Other SURGICAL PATHOLOGY Tanda Greig MATSU, DPM 10/17/2024 1841   A : Deep tissue wound swab left foot Tissue Foot, Left AEROBIC/ANAEROBIC CULTURE W GRAM STAIN (SURGICAL/DEEP WOUND) Tanda Greig MATSU, DPM 10/17/2024 1756   B : Bone culture of left foot Tissue Foot, Left AEROBIC/ANAEROBIC CULTURE W GRAM STAIN (SURGICAL/DEEP WOUND) Tanda Greig MATSU, DPM 10/17/2024 1847      ASSESSMENT:?  [name] is a 58 y.o. male ?with admission for  - Acute on chronic osteomyelitis, left foot.  - Diabetic foot infection,  left foot  Now s/p # TMA left foot with complex rotational flap closure, left foot.    It is possible that acute infection from second metatarsal was resected, however given chronic wound to the plantar lateral aspect of the foot with progressive changes on imaging to the 5th metatarsal - patient will likely require antibiotic coverage for residual osteomyelitis. Skin closure is assisting in reducing external exposure / continued risk for acute on chronic infection via ulceration closure.    *ESR / CRP ordered - close monitoring necessary as signs of acute osteomyelitis are present pre-operative / intraoperatively. Would appreciate ID weigh in. To follow up on intraoperative  findings to assist in ABX guidance.   *Awaiting placement - will need ARU versus SNF (if ID reccs IV ABX)    PLAN:? - Activity: Non weight bearing to the LLE ; can put on CAM boot for transfer activities.   - Diet:  Per primary   - Wound Care: Podiatry to conduct post-op. Materials: Betadine  / Adaptic / Gauze (4x4) / Kerlix / 4 inch ACE bandage Instructions: *Podiatry to conduct post-op. Supplement Reccs: Okay to reinforce with dry gauze / loose ACE overlap prn strikethrough. Please leave underlayer intact.  - ABX: Appreciate assistance with antibiotic stewardship from medicine / pharmacy / ID services.   Anti-infectives (From admission, onward)    Start     Dose/Rate Route Frequency Ordered Stop   10/15/24 1445  vancomycin  (VANCOCIN ) IVPB 1000 mg/200 mL premix        1,000 mg 200 mL/hr over 60 Minutes Intravenous  Once 10/15/24 1440 10/15/24 1656       - Dispo: ARU/ SNF - would like request in for [facility] facility in [place] (patient called and the relayed they accept his insurance etc).

## 2024-10-19 DIAGNOSIS — L97529 Non-pressure chronic ulcer of other part of left foot with unspecified severity: Secondary | ICD-10-CM | POA: Diagnosis not present

## 2024-10-19 LAB — CBC
HCT: 26.2 % — ABNORMAL LOW (ref 39.0–52.0)
Hemoglobin: 7.8 g/dL — ABNORMAL LOW (ref 13.0–17.0)
MCH: 23.9 pg — ABNORMAL LOW (ref 26.0–34.0)
MCHC: 29.8 g/dL — ABNORMAL LOW (ref 30.0–36.0)
MCV: 80.4 fL (ref 80.0–100.0)
Platelets: 539 K/uL — ABNORMAL HIGH (ref 150–400)
RBC: 3.26 MIL/uL — ABNORMAL LOW (ref 4.22–5.81)
RDW: 14.5 % (ref 11.5–15.5)
WBC: 10.8 K/uL — ABNORMAL HIGH (ref 4.0–10.5)
nRBC: 0 % (ref 0.0–0.2)

## 2024-10-19 LAB — GLUCOSE, CAPILLARY
Glucose-Capillary: 322 mg/dL — ABNORMAL HIGH (ref 70–99)
Glucose-Capillary: 258 mg/dL — ABNORMAL HIGH (ref 70–99)
Glucose-Capillary: 175 mg/dL — ABNORMAL HIGH (ref 70–99)
Glucose-Capillary: 165 mg/dL — ABNORMAL HIGH (ref 70–99)

## 2024-10-19 LAB — BASIC METABOLIC PANEL WITH GFR
Anion gap: 6 (ref 5–15)
BUN: 21 mg/dL — ABNORMAL HIGH (ref 6–20)
CO2: 28 mmol/L (ref 22–32)
Calcium: 8.4 mg/dL — ABNORMAL LOW (ref 8.9–10.3)
Chloride: 98 mmol/L (ref 98–111)
Creatinine, Ser: 1.07 mg/dL (ref 0.61–1.24)
GFR, Estimated: >60 mL/min (ref >60)
Glucose, Bld: 310 mg/dL — ABNORMAL HIGH (ref 70–99)
Potassium: 4.6 mmol/L (ref 3.5–5.1)
Sodium: 131 mmol/L — ABNORMAL LOW (ref 135–145)

## 2024-10-19 LAB — SEDIMENTATION RATE: Sed Rate: 106 mm/h — ABNORMAL HIGH (ref 0–20)

## 2024-10-19 LAB — C-REACTIVE PROTEIN: CRP: 10.4 mg/dL — ABNORMAL HIGH (ref <1.0)

## 2024-10-19 MED ORDER — LACTULOSE 10 GM/15ML PO SOLN
20.0000 g | Freq: Once | ORAL | Status: AC
  Administered 2024-10-19: 20 g via ORAL
  Filled 2024-10-19: qty 30

## 2024-10-19 MED ORDER — SODIUM CHLORIDE 0.9 % IV SOLN
2.0000 g | INTRAVENOUS | Status: DC
  Administered 2024-10-19: 2 g via INTRAVENOUS
  Filled 2024-10-19 (×2): qty 20

## 2024-10-19 MED ORDER — POLYETHYLENE GLYCOL 3350 17 G PO PACK
17.0000 g | PACK | Freq: Two times a day (BID) | ORAL | Status: DC
  Filled 2024-10-19 (×5): qty 1

## 2024-10-19 MED ORDER — INSULIN ASPART 100 UNIT/ML IJ SOLN
10.0000 [IU] | Freq: Three times a day (TID) | INTRAMUSCULAR | Status: DC
  Administered 2024-10-19 – 2024-10-22 (×10): 10 [IU] via SUBCUTANEOUS
  Filled 2024-10-19 (×10): qty 10

## 2024-10-19 MED ORDER — INSULIN GLARGINE-YFGN 100 UNIT/ML ~~LOC~~ SOLN
40.0000 [IU] | Freq: Every day | SUBCUTANEOUS | Status: DC
  Administered 2024-10-19: 40 [IU] via SUBCUTANEOUS
  Filled 2024-10-19 (×2): qty 0.4

## 2024-10-19 NOTE — Plan of Care (Signed)
  Problem: Skin Integrity: Goal: Risk for impaired skin integrity will decrease Outcome: Progressing   Problem: Safety: Goal: Ability to remain free from injury will improve Outcome: Progressing   Problem: Pain Managment: Goal: General experience of comfort will improve and/or be controlled Outcome: Progressing   Problem: Elimination: Goal: Will not experience complications related to bowel motility Outcome: Progressing

## 2024-10-19 NOTE — Progress Notes (Signed)
 Physical Therapy Treatment Patient Details Name: [name] MRN: [id number] DOB: 1966-03-23 Today's Date: 10/19/2024   History of Present Illness Pt is a 57 y/o M admitted on 10/15/24 after presenting with R knee pain & L foot wounds. 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 Pt is s/p L foot transmet amputation on 10/17/24 by podiatry. PMH: IDDM2, CKD 3A, obesity, HLD, HTN, cocaine abuse, RLE TMA    PT Comments  Pt ready for session and is able to recall NWB upon questioning.  Dons post op shoes but needs cues to tighten straps for mobility.  He stands to RW with cga x 1 from elevated bed.  Once standing he is asked and stated he felt he could hop around bed and maintain NWB.  He promptly takes 4 steps despite cues  while WB on LLE.  Pt is stopped and cued to hop to chair.  He is able to hop around bed but when he is about 4' from chair, puts foot down and again walks to recliner.  Pt is educated again during gait and transition to chair importance on maintaining NWB for all steps with mobility.  Stressed if he did not feel he could maintain then mobility needs to be limited to transfers only at this time.  He did state he busted open his incision on RLE and is aware of risks of not following NWB.  Discussed WC level activity if he could/would not maintain NWB.  Discussed need to maintain and increase BUE and RLE stength for mobility.  He agrees to standing ex with LLE only.  Transitions to standing with cga/min a x 1 from recliner.  Once standing he does about 10 reps of LLE ex then puts LLE down and begins to do ex with RLE again putting PWB/FWB on LLE.  Pt is promptly stopped and asked why he did that.  He stated RLE was getting tired and he needed to rest it.  Again educated that if he was fatiguing he needed to sit down and again reviewed risks.  At this point session is stopped for non-compliance with WB status during session as he continues to put  himself at risk.  He remains up in chair with needs met.  Discussed with RN.  Pt should be limited to transfers only for mobility and consider lateral scoots to preserve incision if he continues with poor adherence to precautions.  Pt was not interested in reviewing lateral scoot transfers at this time.     If plan is discharge home, recommend the following: A little help with walking and/or transfers;A little help with bathing/dressing/bathroom;Assist for transportation;Assistance with cooking/housework;Help with stairs or ramp for entrance   Can travel by private vehicle     Yes  Equipment Recommendations  Rolling walker (2 wheels);BSC/3in1;Wheelchair (measurements PT);Wheelchair cushion (measurements PT) (TBD in next venue)    Recommendations for Other Services Rehab consult     Precautions / Restrictions Precautions Precautions: Fall Restrictions Weight Bearing Restrictions Per Provider Order: No LLE Weight Bearing Per Provider Order: Non weight bearing (in post op shoe) Other Position/Activity Restrictions: precautions via secure chat with podiatry DM     Mobility  Bed Mobility Overal bed mobility: Modified Independent               Patient Response: Impulsive  Transfers Overall transfer level: Needs assistance Equipment used: Rolling walker (2 wheels) Transfers: Sit to/from Stand Sit to Stand: Contact guard assist, Min assist  General transfer comment: Cues for Left NWB, improved from higher surfaces    Ambulation/Gait Ambulation/Gait assistance: Min assist Gait Distance (Feet): 11 Feet Assistive device: Rolling walker (2 wheels) Gait Pattern/deviations: Decreased step length - right, Decreased stride length, Decreased dorsiflexion - right Gait velocity: decreased     General Gait Details: inconsistant NWB often reverting back to PWB or even mostly FWB with steps - stopped by Dance Movement Psychotherapist     Tilt  Bed Tilt Bed Patient Response: Impulsive  Modified Rankin (Stroke Patients Only)       Balance Overall balance assessment: Needs assistance Sitting-balance support: Feet supported Sitting balance-Leahy Scale: Good Sitting balance - Comments: able to don BLE post op shoes sitting EOB without LOB   Standing balance support: Bilateral upper extremity supported, During functional activity, Reliant on assistive device for balance Standing balance-Leahy Scale: Fair                              Hotel Manager: No apparent difficulties  Cognition Arousal: Alert Behavior During Therapy: WFL for tasks assessed/performed, Impulsive   PT - Cognitive impairments: Safety/Judgement                       PT - Cognition Comments: able to recall NWB and risks of WB but continues to during session despite reeducation Following commands: Intact, Impaired Following commands impaired: Follows one step commands inconsistently    Cueing Cueing Techniques: Verbal cues, Visual cues  Exercises Other Exercises Other Exercises: attempted standing ex with LLE only but pt starts to do RLE putting FWB on LLE - again stopped by writer    General Comments        Pertinent Vitals/Pain Pain Assessment Pain Assessment: Faces Faces Pain Scale: Hurts little more Pain Descriptors / Indicators: Discomfort Pain Intervention(s): Monitored during session, Limited activity within patient's tolerance, Repositioned    Home Living                          Prior Function            PT Goals (current goals can now be found in the care plan section) Progress towards PT goals: Progressing toward goals    Frequency    7X/week      PT Plan      Co-evaluation              AM-PAC PT 6 Clicks Mobility   Outcome Measure  Help needed turning from your back to your side while in a flat bed without using bedrails?: None Help needed moving  from lying on your back to sitting on the side of a flat bed without using bedrails?: A Little Help needed moving to and from a bed to a chair (including a wheelchair)?: A Little Help needed standing up from a chair using your arms (e.g., wheelchair or bedside chair)?: A Little Help needed to walk in hospital room?: A Little Help needed climbing 3-5 steps with a railing? : A Lot 6 Click Score: 18    End of Session   Activity Tolerance: Patient tolerated treatment well;Other (comment) Patient left: in chair;with chair alarm set;with call bell/phone within reach (set up with lunch tray) Nurse Communication: Mobility status PT Visit Diagnosis: Other abnormalities of gait and mobility (R26.89);Difficulty in walking,  not elsewhere classified (R26.2);Muscle weakness (generalized) (M62.81)     Time: 9099-9084 PT Time Calculation (min) (ACUTE ONLY): 15 min  Charges:    $Gait Training: 8-22 mins PT General Charges $$ ACUTE PT VISIT: 1 Visit                   Lauraine Gills, PTA 10/19/24, 9:35 AM

## 2024-10-19 NOTE — Progress Notes (Signed)
 Progress Note   Patient: [name] FMW:[id number] DOB: 04/14/1966 DOA: 10/15/2024     4 DOS: the patient was seen and examined on 10/19/2024   Brief hospital course: Per H&P HPI   HPI: [name] is a 58 y.o. 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      Patient has had pain in his right knee for at least the past 3 weeks.  He has had some falls due to his knee giving out .  He presented to [facility] 10/24.  MRI obtained at that time was inconclusive for osteomyelitis.  An arthrocentesis was attempted but no synovial fluid was able to be collected 1024.  Infectious disease was consulted and had low suspicion for septic arthritis.    He followed up with infectious disease 10/29.  They recommended given patient's persistent pain and concern for possible septic joint and a follow-up with orthopedic team for arthrocentesis. He underwent arthrocentesis which showed no crystals and 24K WBC with neutrophilic predominance. He was scheduled to follow up 11/13 for further treatment.  However he continued to have pain in his right knee.  He presented to the ED 11/8.  Repeat arthrocentesis was performed.  Synovial fluid analysis again showed white blood cells in the 20,000 range.  Synovial fluid cultures were sent which are no growth to date.  He had no crystals. He was again discharged with pain medication.    Patient presents again with swelling and pain in his R knee. He has no systemic symptoms of infection. He denies dyspnea, cough, diarrhea, dysuria.      Of note, patient was seen by podiatry team 11/10.  It was recommended at that time that patient presented to the ED for further management of his chronic left foot ulcers.  Patient has no pain from these.  During a previous hospitalization it was thought that patient had foul-smelling purulent  drainage from these.  The left foot showed chronic osteomyelitis during hospitalization 10/24.  He has completed antibiotics for this since that time.  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 Podiatric team to discuss with ID regarding antibiotic course. Patient did not appear to have SSTI and unclear if osteomyelitis completely removed. Will start patient on ceftriaxone  and f/u OR cultures    T2DM A1c 6.7 09/2024. Currently takes 22u of NPH twice daily as well as metformin .  CBG (last 3)  Recent Labs    10/18/24 1646 10/18/24 2202 10/19/24 0746  GLUCAP 408* 347* 322*   Blood glucose remains poorly controlled.  Will increase Lantus  to 40 units at night with 10 units of mealtime insulin .     HTN Normotensive. Will hold on resuming antihypertensives at this time.     10/19/2024    7:45 AM 10/19/2024    4:27 AM 10/18/2024   11:45 PM  Vitals with BMI  Systolic 129 115 871  Diastolic 80 55 72  Pulse 82 79 86    AKI Improving. Improved with IV fluids      Latest Ref Rng & Units 10/19/2024    7:28 AM 10/18/2024   12:38 PM 10/18/2024    7:57 AM  BMP  Glucose 70 - 99 mg/dL 689  672  552   BUN 6 - 20 mg/dL 21  26  27   Creatinine 0.61 - 1.24 mg/dL 8.92  8.79  8.64   Sodium 135 - 145 mmol/L 131  129  128   Potassium 3.5 - 5.1 mmol/L 4.6  5.1  5.6   Chloride 98 - 111 mmol/L 98  97  97   CO2 22 - 32 mmol/L 28  23  25    Calcium  8.9 - 10.3 mg/dL 8.4  8.3  8.3       Normocytic anemia  Hgb worse this a.m after the OR.. Iron  deficiency noted on labs ~9 months ago. Will need further work up for this outpatient.  -Continues to have Fe deficiency  -Continue PO iron     Hyperkalemia Resolved    Hyponatremia  In the setting of elevated glucose    HLD LDL 62 5 months ago.    Cocaine use Encourage cessation  R knee effusion Multiple recent arthrocenteses. Low concern for septic arthritis. Improved  with acetaminophen  and oxycodone . Continue pain control, will need to see outpatient orthopedic team.       Subjective: No issues on.   Physical Exam: Vitals:   10/18/24 2202 10/18/24 2345 10/19/24 0427 10/19/24 0745  BP: 136/73 128/72 (!) 115/55 129/80  Pulse: 88 86 79 82  Resp: 18 18 20 17   Temp: 98.3 F (36.8 C) (!) 97.2 F (36.2 C) 98.3 F (36.8 C) 98.2 F (36.8 C)  TempSrc:      SpO2: 98% 98% 100% 100%  Weight:      Height:         Physical Exam  Constitutional: In no distress.  Cardiovascular: Normal rate, regular rhythm. No lower extremity edema  Pulmonary: Non labored breathing on room air, no wheezing or rales.   Abdominal: Soft. Non distended and non tender Musculoskeletal: S/p R TMA, L foot bandaged s/p L TMA      Neurological: Alert and oriented to person, place, and time. Non focal  Skin: Skin is warm and dry.    Data Reviewed:     Latest Ref Rng & Units 10/19/2024    7:28 AM 10/18/2024    7:57 AM 10/17/2024    4:16 AM  CBC  WBC 4.0 - 10.5 K/uL 10.8  10.7  7.4   Hemoglobin 13.0 - 17.0 g/dL 7.8  8.4  8.0   Hematocrit 39.0 - 52.0 % 26.2  27.6  26.1   Platelets 150 - 400 K/uL 539  591  523       Latest Ref Rng & Units 10/19/2024    7:28 AM 10/18/2024   12:38 PM 10/18/2024    7:57 AM  BMP  Glucose 70 - 99 mg/dL 689  672  552   BUN 6 - 20 mg/dL 21  26  27    Creatinine 0.61 - 1.24 mg/dL 8.92  8.79  8.64   Sodium 135 - 145 mmol/L 131  129  128   Potassium 3.5 - 5.1 mmol/L 4.6  5.1  5.6   Chloride 98 - 111 mmol/L 98  97  97   CO2 22 - 32 mmol/L 28  23  25    Calcium  8.9 - 10.3 mg/dL 8.4  8.3  8.3      Family Communication: None at bedside.   Disposition: Status is: Inpatient Remains inpatient appropriate because: pending operation   Planned Discharge Destination: Pending PT/OT     Time spent: 35 minutes  Author: Alban Pepper, MD 10/19/2024 10:52 AM  For on call review [email or web address].

## 2024-10-20 DIAGNOSIS — Z89411 Acquired absence of right great toe: Secondary | ICD-10-CM

## 2024-10-20 DIAGNOSIS — L97529 Non-pressure chronic ulcer of other part of left foot with unspecified severity: Secondary | ICD-10-CM | POA: Diagnosis not present

## 2024-10-20 DIAGNOSIS — Z89421 Acquired absence of other right toe(s): Secondary | ICD-10-CM

## 2024-10-20 DIAGNOSIS — Z794 Long term (current) use of insulin: Secondary | ICD-10-CM

## 2024-10-20 LAB — GLUCOSE, CAPILLARY
Glucose-Capillary: 217 mg/dL — ABNORMAL HIGH (ref 70–99)
Glucose-Capillary: 159 mg/dL — ABNORMAL HIGH (ref 70–99)
Glucose-Capillary: 132 mg/dL — ABNORMAL HIGH (ref 70–99)
Glucose-Capillary: 180 mg/dL — ABNORMAL HIGH (ref 70–99)

## 2024-10-20 LAB — CULTURE, BLOOD (ROUTINE X 2)
Culture: NO GROWTH
Culture: NO GROWTH
Special Requests: ADEQUATE
Special Requests: ADEQUATE

## 2024-10-20 LAB — CBC WITH DIFFERENTIAL/PLATELET
Abs Immature Granulocytes: 0.04 K/uL (ref 0.00–0.07)
Basophils Absolute: 0 K/uL (ref 0.0–0.1)
Basophils Relative: 0 %
Eosinophils Absolute: 0.2 K/uL (ref 0.0–0.5)
Eosinophils Relative: 2 %
HCT: 26 % — ABNORMAL LOW (ref 39.0–52.0)
Hemoglobin: 8 g/dL — ABNORMAL LOW (ref 13.0–17.0)
Immature Granulocytes: 1 %
Lymphocytes Relative: 17 %
Lymphs Abs: 1.3 K/uL (ref 0.7–4.0)
MCH: 24.3 pg — ABNORMAL LOW (ref 26.0–34.0)
MCHC: 30.8 g/dL (ref 30.0–36.0)
MCV: 79 fL — ABNORMAL LOW (ref 80.0–100.0)
Monocytes Absolute: 0.7 K/uL (ref 0.1–1.0)
Monocytes Relative: 9 %
Neutro Abs: 5.7 K/uL (ref 1.7–7.7)
Neutrophils Relative %: 71 %
Platelets: 536 K/uL — ABNORMAL HIGH (ref 150–400)
RBC: 3.29 MIL/uL — ABNORMAL LOW (ref 4.22–5.81)
RDW: 14.6 % (ref 11.5–15.5)
WBC: 7.9 K/uL (ref 4.0–10.5)
nRBC: 0 % (ref 0.0–0.2)

## 2024-10-20 LAB — BASIC METABOLIC PANEL WITH GFR
Anion gap: 8 (ref 5–15)
BUN: 16 mg/dL (ref 6–20)
CO2: 25 mmol/L (ref 22–32)
Calcium: 8.3 mg/dL — ABNORMAL LOW (ref 8.9–10.3)
Chloride: 102 mmol/L (ref 98–111)
Creatinine, Ser: 1.07 mg/dL (ref 0.61–1.24)
GFR, Estimated: >60 mL/min (ref >60)
Glucose, Bld: 264 mg/dL — ABNORMAL HIGH (ref 70–99)
Potassium: 4.8 mmol/L (ref 3.5–5.1)
Sodium: 134 mmol/L — ABNORMAL LOW (ref 135–145)

## 2024-10-20 MED ORDER — SODIUM CHLORIDE 0.9 % IV SOLN
2.0000 g | Freq: Three times a day (TID) | INTRAVENOUS | Status: DC
  Administered 2024-10-20 – 2024-10-22 (×6): 2 g via INTRAVENOUS
  Filled 2024-10-20 (×8): qty 12.5

## 2024-10-20 MED ORDER — INSULIN GLARGINE-YFGN 100 UNIT/ML ~~LOC~~ SOLN
44.0000 [IU] | Freq: Every day | SUBCUTANEOUS | Status: DC
  Administered 2024-10-20 – 2024-10-21 (×2): 44 [IU] via SUBCUTANEOUS
  Filled 2024-10-20 (×3): qty 0.44

## 2024-10-20 MED ORDER — KETOROLAC TROMETHAMINE 15 MG/ML IJ SOLN
15.0000 mg | Freq: Once | INTRAMUSCULAR | Status: AC
  Administered 2024-10-20: 15 mg via INTRAVENOUS
  Filled 2024-10-20: qty 1

## 2024-10-20 MED ORDER — SODIUM CHLORIDE 0.9 % IV SOLN: INTRAVENOUS | Status: AC | PRN

## 2024-10-20 NOTE — Progress Notes (Signed)
 PODIATRY: PROGRESS NOTE    Surgery:  Procedure(s) (LRB): AMPUTATION, FOOT, TRANSMETATARSAL (Left) POD:  3 Days Post-Op  O/N: NAEON  Subjective:  Patient resting comfortably at bedside. Patient relays he had been 'hoping around' in order to go to the restroom - endorsing stepping on the operative extremity. He reports he didn't have a BM so he was given a laxative and required the restroom approx 11 times for which he walked back and forth to the restroom.  After these incidents he was told he needed to do PT - but patient relays he declined because he did not feel safe to do so - PT notes demonstrate noncompliance and inability o be safe during transfer.  After PT and bathroom ambulation - he noted bleeding/ strikethrough and his bandaged was changed with non-woven gaue.  Denies F/C/N/V/SOB/CP. Denies acute calf pain.    PHYSICAL EXAMINATION: BP 113/68 (BP Location: Right Arm)   Pulse 85   Temp 97.6 F (36.4 C)   Resp 16   Ht 5' 9 (1.753 m)   Wt 104.3 kg   SpO2 100%   BMI 33.96 kg/m ? GEN: NAD. AOX3. ? RESP: Non-labored breathing on RA.? ABD: NT/ND of all four quadrants.? NEURO: Moving all four extremities spontaneously. ? ? FOCUSED LOWER EXTREMITY EXAMINATION:    Neurological:  - Protective sensation diminished / absent - Gross protective sensation diminished bilaterally.  - No focal motor or sensory deficits identified bilaterally.    Vascular: LEFT - Dorsalis Pedis artery on palpation: 2 - Posterior Tibial artery on palpation: 1 - Capillary Filling Times: sluggish - Peripheral edema: localized to distal L forefoot   Musculoskeletal:  Muscle strength: 5/5 in all 4 quadrants  Ankle Joint ROM: decreased, equinus noted Global foot - TTP: none noted. S/p R TMA  S/p L TMA  - No calf tenderness. ? - Negative Homan's maneuver.  ??  DERM: ? - Dressings c/d/I. ? - Surgical site well coapted.  There is maceration to the fillet / plantar hallux flap - dusky appearance  of the flap suture centrally. ? - No clinical signs of frank dehiscence noted. - No proximal streaking. No malodor. ? - No clinical signs of infection noted. ??         Results for orders placed or performed during the hospital encounter of 10/15/24  Blood culture (routine x 2)     Status: None   Collection Time: 10/15/24  3:01 PM   Specimen: BLOOD  Result Value Ref Range Status   Specimen Description BLOOD BLOOD LEFT FOREARM  Final   Special Requests   Final    BOTTLES DRAWN AEROBIC AND ANAEROBIC Blood Culture adequate volume   Culture   Final    NO GROWTH 5 DAYS Performed at [facility], [address], [place], [place] [address]    Report Status 10/20/2024 FINAL  Final  Blood culture (routine x 2)     Status: None   Collection Time: 10/15/24  3:01 PM   Specimen: BLOOD  Result Value Ref Range Status   Specimen Description BLOOD BLOOD LEFT ARM  Final   Special Requests   Final    BOTTLES DRAWN AEROBIC AND ANAEROBIC Blood Culture adequate volume   Culture   Final    NO GROWTH 5 DAYS Performed at [facility], [address] [place], [place] [address]    Report Status 10/20/2024 FINAL  Final  Aerobic/Anaerobic Culture w Gram Stain (surgical/deep wound)     Status: None (Preliminary  result)   Collection Time: 10/17/24  5:56 PM   Specimen: Foot, Left; Tissue  Result Value Ref Range Status   Specimen Description   Final    WOUND Performed at [facility], [address], [place], [place] [address]    Special Requests   Final    LEFT FOOT Performed at [facility], [address] [place], [place] [address]    Gram Stain   Final    RARE WBC PRESENT, PREDOMINANTLY PMN RARE GRAM POSITIVE COCCI Performed at [facility], [address] [place], [place] [address]    Culture   Final    RARE GRAM NEGATIVE RODS CULTURE REINCUBATED FOR BETTER GROWTH NO ANAEROBES ISOLATED; CULTURE IN PROGRESS FOR 5 DAYS    Report Status  PENDING  Incomplete  Aerobic/Anaerobic Culture w Gram Stain (surgical/deep wound)     Status: None (Preliminary result)   Collection Time: 10/17/24  6:47 PM   Specimen: Foot, Left; Tissue  Result Value Ref Range Status   Specimen Description   Final    TISSUE Performed at [facility], [address] [place], [place] [address]    Special Requests   Final    LEFT FOOT Performed at [facility], [address] [place], [place] [address]    Gram Stain   Final    RARE WBC PRESENT, PREDOMINANTLY PMN NO ORGANISMS SEEN Performed at [facility], [address] [place], [place] [address]    Culture   Final    RARE PSEUDOMONAS AERUGINOSA RARE STREPTOCOCCUS GROUP G Beta hemolytic streptococci are predictably susceptible to penicillin  and other beta lactams. Susceptibility testing not routinely performed. SUSCEPTIBILITIES TO FOLLOW NO ANAEROBES ISOLATED; CULTURE IN PROGRESS FOR 5 DAYS    Report Status PENDING  Incomplete    ID Type Source Tests Collected by Time Destination  1 : Distal specimen of left foot Tissue PATH Other SURGICAL PATHOLOGY Tanda Greig MATSU, DPM 10/17/2024 1841   A : Deep tissue wound swab left foot Tissue Foot, Left AEROBIC/ANAEROBIC CULTURE W GRAM STAIN (SURGICAL/DEEP WOUND) Tanda Greig MATSU, DPM 10/17/2024 1756   B : Bone culture of left foot Tissue Foot, Left AEROBIC/ANAEROBIC CULTURE W GRAM STAIN (SURGICAL/DEEP WOUND) Tanda Greig MATSU, DPM 10/17/2024 1847      ASSESSMENT:?  [name] is a 58 y.o. male ?with admission for  - Acute on chronic osteomyelitis, left foot.  - Diabetic foot infection, left foot  Now s/p # TMA left foot with complex rotational flap closure, left foot.   Patient flap/incision site appeared to be well-perfused and well intact first day postop.  After his incidence on walking to the bathroom numerous times within the following day he noted strikethrough and bandage was changed with an occlusive gauze.  It appears that  the site has been macerated with mild dusky discoloration.  I discussed with the patient the tenuous vasculature that is to the flap and the need to maintain strict nonweightbearing to the left lower extremity.  Patient is unable currently to do any heel weightbearing for transfer as it is not safe on this left lower extremity.  Patient needs to remain strict nonweightbearing.  Patient verbalized his understanding and the importance of staying nonweightbearing.  I advised that if he is to weight-bear and the suture site dehisces, or drainage macerates and compromises the viability of the flap, a revision TMA may be necessary versus BKA as a salvage procedure.  Patient is aware and verbalizes understanding  of the importance of these restrictions.   PLAN:? - Activity: Non weight bearing to the LLE ; can put on CAM boot for transfer activities.   - Diet:  Per primary   - Wound Care: Daily Materials: Betadine  / Adaptic / Gauze (4x4) /ABD pad / Kerlix / 4 inch ACE bandage Instructions: Apply Betadine  paint to the suture site.  Apply Betadine  soaked Adaptic overlying the incision site.  Cover with gauze, ABD pad, secure with Kerlix and a loosely placed Ace. Supplement Reccs: Okay to reinforce with dry gauze / loose ACE overlap prn strikethrough. Please leave underlayer intact.  - ABX: Appreciate assistance with antibiotic stewardship from medicine / pharmacy / ID services.   Anti-infectives (From admission, onward)    Start     Dose/Rate Route Frequency Ordered Stop   10/20/24 1330  ceFEPIme  (MAXIPIME ) 2 g in sodium chloride  0.9 % 100 mL IVPB        2 g 200 mL/hr over 30 Minutes Intravenous Every 8 hours 10/20/24 1232     10/19/24 1200  cefTRIAXone  (ROCEPHIN ) 2 g in sodium chloride  0.9 % 100 mL IVPB  Status:  Discontinued        2 g 200 mL/hr over 30 Minutes Intravenous Every 24 hours 10/19/24 1100 10/20/24 1130   10/15/24 1445  vancomycin  (VANCOCIN ) IVPB 1000 mg/200 mL premix        1,000 mg 200  mL/hr over 60 Minutes Intravenous  Once 10/15/24 1440 10/15/24 1656       - Dispo: ARU/ SNF -will follow-up within 1 week of discharge for site check.

## 2024-10-20 NOTE — Consult Note (Signed)
 NAME: [name]  DOB: [birth year]  MRN: [id number]  Date/Time: 10/20/2024 11:27 AM  REQUESTING PROVIDER: Dr Franchot Subjective:  REASON FOR CONSULT: left foot infection [name] is a 58 y.o. with a history ofDiabetes mellitus, neuropathy, bilateral foot infection, MRSA bacteremia, history of right TMA, chronic ulcers left foot presented to the hospital with left foot worsening ulceration with discharge and pain he was also complaining of right knee pain which has been going on for the past 2 weeks. On 10/01/2024 saw the patient in my clinic and had referred him to the orthopedic surgeon at [facility] clinic for aspiration of the right knee.  Prior to that patient was in [place] between 09/26/2024 until 12/31/2023 for right knee pain.  MRI showed some fluid with synovial enhancement with contrast.  Small nearly full-thickness chondral defect was seen superiorly in the medial patellar facet.  Septic arthritis was suspected but when IR tried to aspirate the joint they did not find any fluid on the ultrasound and hence did not attempt it.  Uric acid was 8.8.  He stopped antibiotics after a day and patient wanted to be discharged and follow-up with me as outpatient.  So they discharged him he saw Dr. Sharrie with orthopedics and on 10/06/2024 and right knee was aspirated and 65 cc of fluid was sent for testing.  The cell count was 24,600.  94% neutrophils.  No crystals were seen.  I do not see culture report.  He had seen podiatrist on 10/13/2024 for the right left foot ulcer   patient in the ED vitals was  10/15/24 13:04  BP 106/69  Temp 98.9 F (37.2 C)  Pulse Rate 98  Resp 17  SpO2 98 %  Weight 230 lb  Height 5' 9 (1.753 m)    Latest Reference Range & Units 10/15/24 13:06  WBC 4.0 - 10.5 K/uL 10.0  Hemoglobin 13.0 - 17.0 g/dL 8.8 (L)  HCT 60.9 - 47.9 % 29.3 (L)  Platelets 150 - 400 K/uL 638 (H)  Creatinine 0.61 - 1.24 mg/dL 8.71 (H)   Blood culture sent.  He was started on vancomycin  and  ceftriaxone . Patient was seen by podiatrist and was taken for surgery on 10/17/2024 and underwent left TMA.  I am also seeing the patient for antibiotic management Patient is doing better  Patient has been on multiple courses of antibiotics for right foot infection for Staph aureus bacteremia for which she had received 4 weeks of antibiotics   Past Medical History:  Diagnosis Date   Amputation of right great toe    Cellulitis and abscess of foot    CKD stage 3a, GFR 45-59 ml/min (HCC)    Cocaine abuse (HCC)    + UDS on 05-12-24   DM (diabetes mellitus), type 2 (HCC)    ESBL (extended spectrum beta-lactamase) producing bacteria infection 04/18/2024   Hyperlipidemia    Hypertension    Methicillin resistant Staphylococcus aureus infection 07/14/2024   MRSA bacteremia    Normocytic anemia    Obesity    Tobacco abuse    Toe osteomyelitis, left (HCC)    Tuberculosis    tested positive,treated with medications.    Past Surgical History:  Procedure Laterality Date   AMPUTATION TOE Right 03/26/2024   Procedure: AMPUTATION, TOE;  Surgeon: Harden Jerona GAILS, MD;  Location: [facility] OR;  Service: Orthopedics;  Laterality: Right;  RIGHT GREAT TOE AMPUTATION   BONE BIOPSY  04/18/2024   Procedure: BIOPSY, BONE (2ND METATARSAL);  Surgeon: Ashley Soulier, DPM;  Location: [facility] ORS;  Service: Orthopedics/Podiatry;;   FOOT SURGERY Left    I & D EXTREMITY Left 05/05/2022   Procedure: LEFT FOOT DEBRIDEMENT;  Surgeon: Harden Jerona GAILS, MD;  Location: [facility] OR;  Service: Orthopedics;  Laterality: Left;   INCISION AND DRAINAGE OF WOUND Right 06/05/2024   Procedure: DELAYED PRIMARY CLOSURE OF RIGHT FOOT WOUND, EXCISION OF DISTAL METATARSAL 2, 3, 4, and 5;  Surgeon: Ashley Soulier, DPM;  Location: [facility] ORS;  Service: Orthopedics/Podiatry;  Laterality: Right;   IRRIGATION AND DEBRIDEMENT FOOT Right 04/18/2024   Procedure: IRRIGATION AND DEBRIDEMENT FOOT, PLACEMENT OF ANTIBIOTIC BEADS;  Surgeon: Ashley Soulier, DPM;  Location:  [facility] ORS;  Service: Orthopedics/Podiatry;  Laterality: Right;   IRRIGATION AND DEBRIDEMENT FOOT Left 06/29/2024   Procedure: IRRIGATION AND DEBRIDEMENT FOOT;  Surgeon: Lennie Barter, DPM;  Location: [facility] ORS;  Service: Orthopedics/Podiatry;  Laterality: Left;   MYRINGOTOMY WITH TUBE PLACEMENT Bilateral 07/20/2022   Procedure: MYRINGOTOMY WITH TUBE PLACEMENT;  Surgeon: Edda Mt, MD;  Location: [facility];  Service: ENT;  Laterality: Bilateral;  Diabetic   TEE WITHOUT CARDIOVERSION N/A 07/02/2024   Procedure: ECHOCARDIOGRAM, TRANSESOPHAGEAL;  Surgeon: Perla Evalene PARAS, MD;  Location: [facility] ORS;  Service: Cardiovascular;  Laterality: N/A;   TONSILLECTOMY     TRANSMETATARSAL AMPUTATION Right 04/23/2024   Procedure: AMPUTATION, FOOT, TRANSMETATARSAL;  Surgeon: Ashley Soulier, DPM;  Location: [facility] ORS;  Service: Orthopedics/Podiatry;  Laterality: Right;   TRANSMETATARSAL AMPUTATION Right 06/29/2024   Procedure: AMPUTATION, FOOT, TRANSMETATARSAL;  Surgeon: Lennie Barter, DPM;  Location: [facility] ORS;  Service: Orthopedics/Podiatry;  Laterality: Right;  RIGHT TRANSMETATARSAL AMPUTATION REVISION   TRANSMETATARSAL AMPUTATION Left 10/17/2024   Procedure: AMPUTATION, FOOT, TRANSMETATARSAL;  Surgeon: Tanda Greig MATSU, DPM;  Location: [facility] ORS;  Service: Podiatry;  Laterality: Left;  pulse lavage available    Social History   Socioeconomic History   Marital status: Single    Spouse name: Not on file   Number of children: Not on file   Years of education: Not on file   Highest education level: Not on file  Occupational History   Not on file  Tobacco Use   Smoking status: Former    Current packs/day: 0.00    Average packs/day: 0.3 packs/day for 25.0 years (6.3 ttl pk-yrs)    Types: Cigarettes    Start date: 09/17/1997    Quit date: 09/17/2022    Years since quitting: 2.0   Smokeless tobacco: Never   Tobacco comments:    Quit  Vaping Use   Vaping status: Never Used  Substance and Sexual  Activity   Alcohol use: No   Drug use: No   Sexual activity: Yes  Other Topics Concern   Not on file  Social History Narrative   Not on file   Social Drivers of Health   Financial Resource Strain: Patient Declined (06/28/2023)   Received from [place] of the [employer] Strain (CARDIA)    Difficulty of Paying Living Expenses: Patient declined  Food Insecurity: No Food Insecurity (10/15/2024)   Hunger Vital Sign    Worried About Running Out of Food in the Last Year: Never true    Ran Out of Food in the Last Year: Never true  Transportation Needs: No Transportation Needs (10/15/2024)   PRAPARE - Administrator, Civil Service (Medical): No    Lack of Transportation (Non-Medical): No  Physical Activity: Not on file  Stress: Not on file  Social Connections: Moderately Integrated (06/26/2024)   Social  Connection and Isolation Panel    Frequency of Communication with Friends and Family: Three times a week    Frequency of Social Gatherings with Friends and Family: Twice a week    Attends Religious Services: 1 to 4 times per year    Active Member of [employer] or Organizations: Yes    Attends Banker Meetings: 1 to 4 times per year    Marital Status: Never married  Intimate Partner Violence: Not At Risk (10/15/2024)   Humiliation, Afraid, Rape, and Kick questionnaire    Fear of Current or Ex-Partner: No    Emotionally Abused: No    Physically Abused: No    Sexually Abused: No    Family History  Problem Relation Age of Onset   Colon cancer Neg Hx    Colon polyps Neg Hx    Crohn's disease Neg Hx    Esophageal cancer Neg Hx    Rectal cancer Neg Hx    Stomach cancer Neg Hx    Ulcerative colitis Neg Hx    No Known Allergies I? Current Facility-Administered Medications  Medication Dose Route Frequency Provider Last Rate Last Admin   acetaminophen  (TYLENOL ) tablet 650 mg  650 mg Oral Q6H PRN Franchot Novel, MD       cefTRIAXone   (ROCEPHIN ) 2 g in sodium chloride  0.9 % 100 mL IVPB  2 g Intravenous Q24H Franchot Novel, MD   Stopped at 10/19/24 1215   diclofenac Sodium (VOLTAREN) 1 % topical gel 2 g  2 g Topical QID Tanda Greig MATSU, DPM   2 g at 10/20/24 9162   gabapentin  (NEURONTIN ) capsule 300 mg  300 mg Oral BID Tanda Greig MATSU, DPM   300 mg at 10/20/24 0836   heparin  injection 5,000 Units  5,000 Units Subcutaneous Q8H Tanda Greig MATSU, DPM   5,000 Units at 10/20/24 9478   HYDROmorphone  (DILAUDID ) injection 0.5-1 mg  0.5-1 mg Intravenous Q2H PRN Tanda Greig MATSU, DPM   1 mg at 10/19/24 0515   insulin  aspart (novoLOG ) injection 0-9 Units  0-9 Units Subcutaneous TID WC Tanda Greig MATSU, DPM   3 Units at 10/20/24 9163   insulin  aspart (novoLOG ) injection 10 Units  10 Units Subcutaneous TID WC Franchot Novel, MD   10 Units at 10/20/24 9162   insulin  glargine-yfgn (SEMGLEE ) injection 40 Units  40 Units Subcutaneous Q2200 Franchot Novel, MD   40 Units at 10/19/24 2111   iron  polysaccharides (NIFEREX) capsule 150 mg  150 mg Oral Daily Tanda Greig MATSU, DPM   150 mg at 10/19/24 1328   oxyCODONE -acetaminophen  (PERCOCET/ROXICET) 5-325 MG per tablet 1 tablet  1 tablet Oral Q4H PRN Franchot Novel, MD       Or   oxyCODONE -acetaminophen  (PERCOCET/ROXICET) 5-325 MG per tablet 2 tablet  2 tablet Oral Q4H PRN Franchot Novel, MD   2 tablet at 10/20/24 1110   polyethylene glycol (MIRALAX  / GLYCOLAX ) packet 17 g  17 g Oral BID Franchot Novel, MD       senna NALANI) tablet 8.6 mg  1 tablet Oral BID Tanda, Amy G, DPM   8.6 mg at 10/19/24 2110     Abtx:  Anti-infectives (From admission, onward)    Start     Dose/Rate Route Frequency Ordered Stop   10/19/24 1200  cefTRIAXone  (ROCEPHIN ) 2 g in sodium chloride  0.9 % 100 mL IVPB        2 g 200 mL/hr over 30 Minutes Intravenous Every 24 hours 10/19/24 1100     10/15/24  1445  vancomycin  (VANCOCIN ) IVPB 1000 mg/200 mL premix        1,000 mg 200 mL/hr over 60 Minutes Intravenous  Once  10/15/24 1440 10/15/24 1656       REVIEW OF SYSTEMS:  Const: negative fever, negative chills, negative weight loss Eyes: negative diplopia or visual changes, negative eye pain ENT: negative coryza, negative sore throat Resp: negative cough, hemoptysis, dyspnea Cards: negative for chest pain, palpitations, lower extremity edema GU: negative for frequency, dysuria and hematuria GI: Negative for abdominal pain, diarrhea, bleeding, constipation Skin: negative for rash and pruritus Heme: negative for easy bruising and gum/nose bleeding MS: Right knee pain and left foot pain.  Neurolo:negative for headaches, dizziness, vertigo, memory problems  Psych: , depression  Endocrine:  diabetes Allergy/Immunology-no known drug allergies o VITALS:  BP 113/68 (BP Location: Right Arm)   Pulse 85   Temp 97.6 F (36.4 C)   Resp 16   Ht 5' 9 (1.753 m)   Wt 104.3 kg   SpO2 100%   BMI 33.96 kg/m  LDA Foley Central line Other drainage tubes PHYSICAL EXAM:  General: Alert, cooperative, no distress, appears stated age.  Head: Normocephalic, without obvious abnormality, atraumatic. Eyes: Conjunctivae clear, anicteric sclerae. Pupils are equal ENT Nares normal. No drainage or sinus tenderness. Lips, mucosa, and tongue normal. No Thrush Neck: Supple, symmetrical, no adenopathy, thyroid : non tender no carotid bruit and no JVD. Back: No CVA tenderness. Lungs: Clear to auscultation bilaterally. No Wheezing or Rhonchi. No rales. Heart: Regular rate and rhythm, no murmur, rub or gallop. Abdomen: Soft, non-tender,not distended. Bowel sounds normal. No masses Extremities: Picture reviewed       Skin: No rashes or lesions. Or bruising Lymph: Cervical, supraclavicular normal. Neurologic: Grossly non-focal Pertinent Labs Lab Results CBC    Component Value Date/Time   WBC 7.9 10/20/2024 0316   RBC 3.29 (L) 10/20/2024 0316   HGB 8.0 (L) 10/20/2024 0316   HCT 26.0 (L) 10/20/2024 0316   PLT  536 (H) 10/20/2024 0316   MCV 79.0 (L) 10/20/2024 0316   MCH 24.3 (L) 10/20/2024 0316   MCHC 30.8 10/20/2024 0316   RDW 14.6 10/20/2024 0316   LYMPHSABS 1.3 10/20/2024 0316   MONOABS 0.7 10/20/2024 0316   EOSABS 0.2 10/20/2024 0316   BASOSABS 0.0 10/20/2024 0316       Latest Ref Rng & Units 10/20/2024    3:16 AM 10/19/2024    7:28 AM 10/18/2024   12:38 PM  CMP  Glucose 70 - 99 mg/dL 735  689  672   BUN 6 - 20 mg/dL 16  21  26    Creatinine 0.61 - 1.24 mg/dL 8.92  8.92  8.79   Sodium 135 - 145 mmol/L 134  131  129   Potassium 3.5 - 5.1 mmol/L 4.8  4.6  5.1   Chloride 98 - 111 mmol/L 102  98  97   CO2 22 - 32 mmol/L 25  28  23    Calcium  8.9 - 10.3 mg/dL 8.3  8.4  8.3       Microbiology: Recent Results (from the past 240 hours)  Body fluid culture w Gram Stain     Status: None   Collection Time: 10/11/24  9:53 PM   Specimen: Body Fluid  Result Value Ref Range Status   Specimen Description FLUID  Final   Special Requests NONE  Final   Gram Stain   Final    FEW WBC PRESENT, PREDOMINANTLY PMN NO ORGANISMS SEEN  Culture   Final    NO GROWTH 3 DAYS Performed at [facility], [address], [place], [place] [address]    Report Status 10/15/2024 FINAL  Final  Blood culture (routine x 2)     Status: None   Collection Time: 10/15/24  3:01 PM   Specimen: BLOOD  Result Value Ref Range Status   Specimen Description BLOOD BLOOD LEFT FOREARM  Final   Special Requests   Final    BOTTLES DRAWN AEROBIC AND ANAEROBIC Blood Culture adequate volume   Culture   Final    NO GROWTH 5 DAYS Performed at [facility], [address], [place], [place] [address]    Report Status 10/20/2024 FINAL  Final  Blood culture (routine x 2)     Status: None   Collection Time: 10/15/24  3:01 PM   Specimen: BLOOD  Result Value Ref Range Status   Specimen Description BLOOD BLOOD LEFT ARM  Final   Special Requests   Final    BOTTLES DRAWN AEROBIC AND ANAEROBIC Blood Culture  adequate volume   Culture   Final    NO GROWTH 5 DAYS Performed at [facility], [address], [place], [place] [address]    Report Status 10/20/2024 FINAL  Final  Aerobic/Anaerobic Culture w Gram Stain (surgical/deep wound)     Status: None (Preliminary result)   Collection Time: 10/17/24  5:56 PM   Specimen: Foot, Left; Tissue  Result Value Ref Range Status   Specimen Description   Final    WOUND Performed at [facility], [address], [place], [place] [address]    Special Requests   Final    LEFT FOOT Performed at [facility], [address] [place], [place] [address]    Gram Stain   Final    RARE WBC PRESENT, PREDOMINANTLY PMN RARE GRAM POSITIVE COCCI Performed at [facility], [address] [place], [place] [address]    Culture   Final    RARE GRAM NEGATIVE RODS CULTURE REINCUBATED FOR BETTER GROWTH NO ANAEROBES ISOLATED; CULTURE IN PROGRESS FOR 5 DAYS    Report Status PENDING  Incomplete  Aerobic/Anaerobic Culture w Gram Stain (surgical/deep wound)     Status: None (Preliminary result)   Collection Time: 10/17/24  6:47 PM   Specimen: Foot, Left; Tissue  Result Value Ref Range Status   Specimen Description   Final    TISSUE Performed at [facility], [address], [place], [place] [address]    Special Requests   Final    LEFT FOOT Performed at [facility], [address] [place], [place] [address]    Gram Stain   Final    RARE WBC PRESENT, PREDOMINANTLY PMN NO ORGANISMS SEEN Performed at [facility], [address] [place], [place] [address]    Culture   Final    RARE PSEUDOMONAS AERUGINOSA RARE STREPTOCOCCUS GROUP G Beta hemolytic streptococci are predictably susceptible to penicillin  and other beta lactams. Susceptibility testing not routinely performed. SUSCEPTIBILITIES TO FOLLOW NO ANAEROBES ISOLATED; CULTURE IN PROGRESS FOR 5 DAYS    Report Status PENDING  Incomplete     IMAGING RESULTS: 10/16/24 MRI Plantar surface wound which communicates with the second metatarsal head as above.  There is destruction of the second metatarsal head and second proximal phalanx. I have personally reviewed the films ? Impression/Recommendation ? Chronic left foot ulceration, trophic ulcers for the past 2 years. Multiple courses of antibiotic  Patient underwent TMA Cultures over Pseudomonas and group B streptococcus Patient is currently on ceftriaxone  changed to cefepime .  Right knee sub acute pain MOVT 65 degrees flexion On 10/06/2024 he underwent aspiration of the knee and I do not see a culture Ortho consult  Rt foot Diabetic foot infection S/p TMA- healed well  DM- management as per primary team  Anemia  This consult involve complex antimicrobial management   ________________________________________________ Discussed with patient and ID pharmacist.

## 2024-10-20 NOTE — Plan of Care (Signed)
  Problem: Fluid Volume: Goal: Ability to maintain a balanced intake and output will improve Outcome: Progressing   Problem: Nutritional: Goal: Maintenance of adequate nutrition will improve Outcome: Progressing   Problem: Education: Goal: Knowledge of General Education information will improve Description: Including pain rating scale, medication(s)/side effects and non-pharmacologic comfort measures Outcome: Progressing   Problem: Activity: Goal: Risk for activity intolerance will decrease Outcome: Progressing   Problem: Pain Managment: Goal: General experience of comfort will improve and/or be controlled Outcome: Progressing

## 2024-10-20 NOTE — NC FL2 (Signed)
   MEDICAID FL2 LEVEL OF CARE FORM     IDENTIFICATION  Patient Name: [name] Birthdate: [birth year] Sex: male Admission Date (Current Location): 10/15/2024  [facility] [place] Number:      Facility and Address:  [facility], [address] [place], [place] [address]      Provider Number: [id number]  Attending Physician Name and Address:  Franchot Novel, MD  Relative Name and Phone Number:  Arloa Grice [facility])  [phone] [facility])    Current Level of Care: Hospital Recommended Level of Care: Skilled Nursing Facility Prior Approval Number:    Date Approved/Denied:   PASRR Number: [id number]  Discharge Plan: SNF    Current Diagnoses: Patient Active Problem List   Diagnosis Date Noted   Non-healing ulcer of left foot (HCC) 10/15/2024   Right knee pain 09/27/2024   Acute renal failure superimposed on stage 3a chronic kidney disease, unspecified acute renal failure type (HCC) 09/26/2024   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/02/2024   Wound infection 07/01/2024   Uncontrolled type 2 diabetes mellitus with hyperglycemia, with long-term current use of insulin  (HCC) 06/27/2024   Acute osteomyelitis of right foot (HCC) 06/26/2024   MRSA bacteremia 04/21/2024   HLD (hyperlipidemia) 04/17/2024   Chronic kidney disease, stage 3a (HCC) 04/17/2024   Normocytic anemia 04/17/2024   Amputation of right great toe 03/31/2024   Necrotizing soft tissue infection 03/26/2024   Hypertension associated with diabetes (HCC) 12/12/2022   Toe osteomyelitis, left (HCC) 12/12/2022   Pyogenic inflammation of bone (HCC) 06/02/2022   PICC (peripherally inserted central catheter) in place 06/02/2022   Medication management 06/02/2022   Diabetic foot infection (HCC) 06/02/2022   Smoking 06/02/2022   Tobacco abuse 05/06/2022   Hyperkalemia 05/06/2022   Obesity (BMI 30-39.9) 05/05/2022   Cellulitis and abscess of foot    Injury of left foot    Diabetic foot ulcer (HCC)  12/14/2021    Orientation RESPIRATION BLADDER Height & Weight     Self, Time, Place, Situation  Normal Continent Weight: 229 lb 15 oz (104.3 kg) Height:  5' 9 (175.3 cm)  BEHAVIORAL SYMPTOMS/MOOD NEUROLOGICAL BOWEL NUTRITION STATUS      Continent Diet (Diet Carb Modified Fluid consistency: Thin; Room service appropriate? Yes: Carb Control starting at 11/14 2209)  AMBULATORY STATUS COMMUNICATION OF NEEDS Skin   Extensive Assist Verbally Normal, Surgical wounds, Other (Comment) (Amputation)                       Personal Care Assistance Level of Assistance  Bathing, Feeding, Dressing Bathing Assistance: Limited assistance Feeding assistance: Limited assistance Dressing Assistance: Limited assistance     Functional Limitations Info  Sight, Hearing, Speech          SPECIAL CARE FACTORS FREQUENCY  PT (By licensed PT), OT (By licensed OT)     PT Frequency: 7x OT Frequency: 7x            Contractures      Additional Factors Info  Allergies, Code Status Code Status Info: FULL Allergies Info: NKA           Current Medications (10/20/2024):  This is the current hospital active medication list Current Facility-Administered Medications  Medication Dose Route Frequency Provider Last Rate Last Admin   acetaminophen  (TYLENOL ) tablet 650 mg  650 mg Oral Q6H PRN Franchot Novel, MD       ceFEPIme  (MAXIPIME ) 2 g in sodium chloride  0.9 % 100 mL IVPB  2 g Intravenous Q8H Elesa Perkins, RPH 200  mL/hr at 10/20/24 1319 2 g at 10/20/24 1319   diclofenac Sodium (VOLTAREN) 1 % topical gel 2 g  2 g Topical QID Tanda Greig MATSU, DPM   2 g at 10/20/24 9162   gabapentin  (NEURONTIN ) capsule 300 mg  300 mg Oral BID Tanda Greig MATSU, DPM   300 mg at 10/20/24 0836   heparin  injection 5,000 Units  5,000 Units Subcutaneous Q8H WilsonGreig MATSU, DPM   5,000 Units at 10/20/24 1323   HYDROmorphone  (DILAUDID ) injection 0.5-1 mg  0.5-1 mg Intravenous Q2H PRN Tanda Greig MATSU, DPM   1 mg at 10/19/24  0515   insulin  aspart (novoLOG ) injection 0-9 Units  0-9 Units Subcutaneous TID WC Tanda Greig MATSU, DPM   2 Units at 10/20/24 1323   insulin  aspart (novoLOG ) injection 10 Units  10 Units Subcutaneous TID WC Franchot Novel, MD   10 Units at 10/20/24 1324   insulin  glargine-yfgn (SEMGLEE ) injection 40 Units  40 Units Subcutaneous Q2200 Franchot Novel, MD   40 Units at 10/19/24 2111   iron  polysaccharides (NIFEREX) capsule 150 mg  150 mg Oral Daily Tanda Greig MATSU, DPM   150 mg at 10/19/24 1328   oxyCODONE -acetaminophen  (PERCOCET/ROXICET) 5-325 MG per tablet 1 tablet  1 tablet Oral Q4H PRN Franchot Novel, MD       Or   oxyCODONE -acetaminophen  (PERCOCET/ROXICET) 5-325 MG per tablet 2 tablet  2 tablet Oral Q4H PRN Franchot Novel, MD   2 tablet at 10/20/24 1110   polyethylene glycol (MIRALAX  / GLYCOLAX ) packet 17 g  17 g Oral BID Franchot Novel, MD       senna NALANI) tablet 8.6 mg  1 tablet Oral BID Tanda, Amy G, DPM   8.6 mg at 10/19/24 2110     Discharge Medications: Please see discharge summary for a list of discharge medications.  Relevant Imaging Results:  Relevant Lab Results:   Additional Information [id number]  Gwendloyn Forsee L Corona Popovich, LCSW

## 2024-10-20 NOTE — Progress Notes (Signed)
 Occupational Therapy Treatment Patient Details Name: Burhanuddin Kohlmann MRN: [id number] DOB: 03-17-1966 Today's Date: 10/20/2024   History of present illness Pt is a 58 y/o M admitted on 10/15/24 after presenting with R knee pain & L foot wounds. 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 Pt is s/p L foot transmet amputation on 10/17/24 by podiatry. PMH: IDDM2, CKD 3A, obesity, HLD, HTN, cocaine abuse, RLE TMA   OT comments  Mr. Choquette was seen for OT treatment on this date. Upon arrival to room pt semi-supine in bed, agreeable to OT Tx session with some encouragement. OT facilitated ADL management with education and assistance as described below. See ADL section for additional details regarding occupational performance. Pt continues to be functionally limited by decreased functional use of his LLE, increased LLE pain, and decreased balance. Pt return verbalizes understanding of education provided t/o session. Pt is progressing toward OT goals and continues to benefit from skilled OT services to maximize return to PLOF and minimize risk of future falls, injury, and readmission. Will continue to follow POC as written. Discharge recommendation remains appropriate.        If plan is discharge home, recommend the following:  A little help with walking and/or transfers;A little help with bathing/dressing/bathroom;Assistance with cooking/housework   Equipment Recommendations  None recommended by OT    Recommendations for Other Services      Precautions / Restrictions Precautions Precautions: Fall Restrictions Weight Bearing Restrictions Per Provider Order: Yes LLE Weight Bearing Per Provider Order: Non weight bearing       Mobility Bed Mobility Overal bed mobility: Modified Independent Bed Mobility: Supine to Sit     Supine to sit: HOB elevated, Used rails, Supervision          Transfers                   General transfer comment:  NT, pt declines OOB activity this date.     Balance Overall balance assessment: Needs assistance Sitting-balance support: Feet supported, Single extremity supported Sitting balance-Leahy Scale: Good                                     ADL either performed or assessed with clinical judgement   ADL Overall ADL's : Needs assistance/impaired                                     Functional mobility during ADLs: Supervision/safety;Set up General ADL Comments: SET UP/SUP for bed mobility, pt declines additional functional activity at EOB 2/2 recently finishing up wash up with NT. Able to return verbalize understanding of education on NWB status through his LLE. Discussed DC disposition and typical rehab process. Pt eager to get to STR. Educated on safety, falls prevention strategies for home and hospital, and considerations for mgt of LB ADL management in setting of LLE wound. Pt return verbalizes understanding of education provided.    Extremity/Trunk Assessment              Vision Patient Visual Report: No change from baseline     Perception     Praxis     Communication Communication Communication: No apparent difficulties   Cognition Arousal: Alert Behavior During Therapy: [facility] for tasks assessed/performed  Following commands: Intact Following commands impaired: Follows one step commands inconsistently, Only follows one step commands consistently      Cueing   Cueing Techniques: Verbal cues  Exercises Other Exercises Other Exercises: Session focuses on pt education as described above. See ADL section for additional detail.    Shoulder Instructions       General Comments      Pertinent Vitals/ Pain       Pain Assessment Pain Assessment: Faces Faces Pain Scale: Hurts little more Pain Location: Left foot Pain Descriptors / Indicators: Discomfort, Guarding Pain Intervention(s): Limited  activity within patient's tolerance, Monitored during session, Repositioned  Home Living                                          Prior Functioning/Environment              Frequency  Min 2X/week        Progress Toward Goals  OT Goals(current goals can now be found in the care plan section)  Progress towards OT goals: Progressing toward goals  Acute Rehab OT Goals Patient Stated Goal: to return to PLOF OT Goal Formulation: With patient Time For Goal Achievement: 11/01/24 Potential to Achieve Goals: Good  Plan      Co-evaluation                 AM-PAC OT 6 Clicks Daily Activity     Outcome Measure   Help from another person eating meals?: None Help from another person taking care of personal grooming?: A Little Help from another person toileting, which includes using toliet, bedpan, or urinal?: A Little Help from another person bathing (including washing, rinsing, drying)?: A Little Help from another person to put on and taking off regular upper body clothing?: None Help from another person to put on and taking off regular lower body clothing?: A Little 6 Click Score: 20    End of Session    OT Visit Diagnosis: Muscle weakness (generalized) (M62.81)   Activity Tolerance Patient tolerated treatment well   Patient Left with call bell/phone within reach;in bed (Pt recieved wtihout bed alarm set. Pt statesI don't want that thing going off)   Nurse Communication          Time: 1025-1036 OT Time Calculation (min): 11 min  Charges: OT General Charges $OT Visit: 1 Visit OT Treatments $Self Care/Home Management : 8-22 mins  Jhonny Pelton, M.S., OTR/L 10/20/24, 12:41 PM

## 2024-10-20 NOTE — Progress Notes (Signed)
 Progress Note   Patient: [name] FMW:[id number] DOB: Apr 11, 1966 DOA: 10/15/2024     5 DOS: the patient was seen and examined on 10/20/2024   Brief hospital course: Per H&P HPI   HPI: [name] is a 58 y.o. 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      Patient has had pain in his right knee for at least the past 3 weeks.  He has had some falls due to his knee giving out .  He presented to [facility] 10/[facility].  MRI obtained at that time was inconclusive for osteomyelitis.  An arthrocentesis was attempted but no synovial fluid was able to be collected 1024.  Infectious disease was consulted and had low suspicion for septic arthritis.    He followed up with infectious disease 10/29.  They recommended given patient's persistent pain and concern for possible septic joint and a follow-up with orthopedic team for arthrocentesis. He underwent arthrocentesis which showed no crystals and 24K WBC with neutrophilic predominance. He was scheduled to follow up 11/13 for further treatment.  However he continued to have pain in his right knee.  He presented to the ED 11/8.  Repeat arthrocentesis was performed.  Synovial fluid analysis again showed white blood cells in the 20,000 range.  Synovial fluid cultures were sent which are no growth to date.  He had no crystals. He was again discharged with pain medication.    Patient presents again with swelling and pain in his R knee. He has no systemic symptoms of infection. He denies dyspnea, cough, diarrhea, dysuria.      Of note, patient was seen by podiatry team 11/10.  It was recommended at that time that patient presented to the ED for further management of his chronic left foot ulcers.  Patient has no pain from these.  During a previous hospitalization it was thought that patient had foul-smelling purulent  drainage from these.  The left foot showed chronic osteomyelitis during hospitalization 10/24.  He has completed antibiotics for this since that time.  Assessment and Plan: Non healing chronic L foot diabetic ulcer  Chronic osteomyelitis    Status post left TMA..  Podiatric surgery team patient has residual osteomyelitis.  Bone cultures growing Pseudomonas. -Transition from ceftriaxone  to cefepime  this a.m. -Infectious disease consulted, follow recommendations   T2DM A1c 6.7 09/2024. Currently takes 22u of NPH twice daily as well as metformin .  CBG (last 3)  Recent Labs    10/20/24 0821 10/20/24 1152 10/20/24 1648  GLUCAP 217* 159* 132*   Blood glucose poorly controlled this a.m., will increase Lantus  to 44 units.  Will continue 10 units with meals.    HTN Normotensive. Will hold on resuming antihypertensives at this time.     10/20/2024    2:47 PM 10/20/2024    8:20 AM 10/20/2024    5:27 AM  Vitals with BMI  Systolic 121 113 875  Diastolic 62 68 82  Pulse 86 85 100    AKI resolved       Latest Ref Rng & Units 10/20/2024    3:16 AM 10/19/2024    7:28 AM 10/18/2024   12:38 PM  BMP  Glucose 70 - 99 mg/dL 735  689  672   BUN 6 - 20 mg/dL 16  21  26    Creatinine 0.61 - 1.24 mg/dL 8.92  1.07  1.20   Sodium 135 - 145 mmol/L 134  131  129   Potassium 3.5 - 5.1 mmol/L 4.8  4.6  5.1   Chloride 98 - 111 mmol/L 102  98  97   CO2 22 - 32 mmol/L 25  28  23    Calcium  8.9 - 10.3 mg/dL 8.3  8.4  8.3       Normocytic anemia  Hgb worse this a.m after the OR.. Iron  deficiency noted on labs ~9 months ago. Will need further work up for this outpatient.  -Continues to have Fe deficiency  -Continue PO iron     Hyperkalemia Resolved    Hyponatremia  Improved, in the setting of elevated blood glucose    HLD LDL 62 5 months ago.    Cocaine use Encourage cessation  R knee effusion Multiple recent arthrocenteses. Low concern for septic arthritis. Improved with  acetaminophen  and oxycodone . Continue pain control, will need to see outpatient orthopedic team.       Subjective: Continues to have some pain   Physical Exam: Vitals:   10/19/24 1944 10/20/24 0527 10/20/24 0820 10/20/24 1447  BP: 125/62 124/82 113/68 121/62  Pulse: 89 100 85 86  Resp: 18 18 16 17   Temp: 98.4 F (36.9 C) 98.6 F (37 C) 97.6 F (36.4 C) 98 F (36.7 C)  TempSrc: Oral     SpO2: 99% 97% 100% 99%  Weight:      Height:           Constitutional: In no distress.  Cardiovascular: Normal rate, regular rhythm. No lower extremity edema  Pulmonary: Non labored breathing on room air, no wheezing or rales.   Abdominal: Soft. Non distended and non tender Musculoskeletal: S/p R TMA, L foot bandaged s/p L TMA    Neurological: Alert and oriented to person, place, and time. Non focal  Skin: Skin is warm and dry.    Data Reviewed:     Latest Ref Rng & Units 10/20/2024    3:16 AM 10/19/2024    7:28 AM 10/18/2024    7:57 AM  CBC  WBC 4.0 - 10.5 K/uL 7.9  10.8  10.7   Hemoglobin 13.0 - 17.0 g/dL 8.0  7.8  8.4   Hematocrit 39.0 - 52.0 % 26.0  26.2  27.6   Platelets 150 - 400 K/uL 536  539  591       Latest Ref Rng & Units 10/20/2024    3:16 AM 10/19/2024    7:28 AM 10/18/2024   12:38 PM  BMP  Glucose 70 - 99 mg/dL 735  689  672   BUN 6 - 20 mg/dL 16  21  26    Creatinine 0.61 - 1.24 mg/dL 8.92  8.92  8.79   Sodium 135 - 145 mmol/L 134  131  129   Potassium 3.5 - 5.1 mmol/L 4.8  4.6  5.1   Chloride 98 - 111 mmol/L 102  98  97   CO2 22 - 32 mmol/L 25  28  23    Calcium  8.9 - 10.3 mg/dL 8.3  8.4  8.3      Family Communication: None at bedside.   Disposition: Status is: Inpatient Remains inpatient appropriate because: pending operation   Planned Discharge Destination: Pending SNF    Time spent: 35 minutes  Author: Alban Pepper, MD 10/20/2024 6:01 PM  For on call review [email or web address].

## 2024-10-20 NOTE — TOC Progression Note (Signed)
 Transition of Care [facility]) - Progression Note    Patient Details  Name: Finlay Godbee MRN: [id number] Date of Birth: [birth year]  [facility] CM/SW Contact  Victory Jackquline RAMAN, RN Phone Number: 10/20/2024, 6:38 PM  Clinical Narrative:   RNCM met with patient at bedside. RNCM introduced role and explained that discharge planning would be discussed. Informed patient that no bed offers have been received. Pt given list of outpatient PT facilities around the [place] area. Patient will review list and let RNCM know which facility he picks. States that his brother will transport him to Outpatient PT. He lives alone and his brother takes him to his appointments. RNCM will continue to follow for discharge planning needs.                        Expected Discharge Plan and Services                                               Social Drivers of Health (SDOH) Interventions SDOH Screenings   Food Insecurity: No Food Insecurity (10/15/2024)  Housing: Low Risk  (10/15/2024)  Transportation Needs: No Transportation Needs (10/15/2024)  Utilities: Not At Risk (10/15/2024)  Depression (PHQ2-9): Low Risk  (08/28/2023)  Financial Resource Strain: Patient Declined (06/28/2023)   Received from [employer]  Social Connections: Moderately Integrated (06/26/2024)  Tobacco Use: Medium Risk (10/17/2024)  Health Literacy: Adequate Health Literacy (05/01/2024)    Readmission Risk Interventions     No data to display

## 2024-10-20 NOTE — Progress Notes (Signed)
 PT visit  Pt in bed. Stated he has been up walking to bathroom with nursing staff.  Reviewed NWB and his inability to maintain prior sessions and recommendation for transfers only.  He stated he hops some but does admit to putting weight on LLE.  Discussed recommendation for [facility] but he stated he is not interested in using one and wants to use bathroom.  Again stressed need to follow MD orders post op for proper healing and risks of not doing so.  I know she wouldn't like that in regards to putting weight on LE.  He stated he does not want to sit in recliner, and does some ex in bed as he feels like it but R knee is sore and he limits it.  Session is deferred at this time.  Pt remains at risk for post op complications due to choices and noncompliance.  Extensive education on risks/benefits has been given.

## 2024-10-21 DIAGNOSIS — L97529 Non-pressure chronic ulcer of other part of left foot with unspecified severity: Secondary | ICD-10-CM | POA: Diagnosis not present

## 2024-10-21 DIAGNOSIS — M86172 Other acute osteomyelitis, left ankle and foot: Secondary | ICD-10-CM

## 2024-10-21 DIAGNOSIS — E11628 Type 2 diabetes mellitus with other skin complications: Secondary | ICD-10-CM

## 2024-10-21 DIAGNOSIS — B965 Pseudomonas (aeruginosa) (mallei) (pseudomallei) as the cause of diseases classified elsewhere: Secondary | ICD-10-CM | POA: Diagnosis not present

## 2024-10-21 DIAGNOSIS — Z89422 Acquired absence of other left toe(s): Secondary | ICD-10-CM

## 2024-10-21 DIAGNOSIS — M25561 Pain in right knee: Secondary | ICD-10-CM

## 2024-10-21 DIAGNOSIS — L089 Local infection of the skin and subcutaneous tissue, unspecified: Secondary | ICD-10-CM

## 2024-10-21 DIAGNOSIS — B951 Streptococcus, group B, as the cause of diseases classified elsewhere: Secondary | ICD-10-CM

## 2024-10-21 DIAGNOSIS — M869 Osteomyelitis, unspecified: Secondary | ICD-10-CM

## 2024-10-21 DIAGNOSIS — Z89412 Acquired absence of left great toe: Secondary | ICD-10-CM

## 2024-10-21 LAB — BASIC METABOLIC PANEL WITH GFR
Anion gap: 9 (ref 5–15)
BUN: 17 mg/dL (ref 6–20)
CO2: 24 mmol/L (ref 22–32)
Calcium: 8.5 mg/dL — ABNORMAL LOW (ref 8.9–10.3)
Chloride: 101 mmol/L (ref 98–111)
Creatinine, Ser: 1.18 mg/dL (ref 0.61–1.24)
GFR, Estimated: >60 mL/min (ref >60)
Glucose, Bld: 173 mg/dL — ABNORMAL HIGH (ref 70–99)
Potassium: 5 mmol/L (ref 3.5–5.1)
Sodium: 134 mmol/L — ABNORMAL LOW (ref 135–145)

## 2024-10-21 LAB — SURGICAL PATHOLOGY

## 2024-10-21 LAB — GLUCOSE, CAPILLARY
Glucose-Capillary: 134 mg/dL — ABNORMAL HIGH (ref 70–99)
Glucose-Capillary: 167 mg/dL — ABNORMAL HIGH (ref 70–99)
Glucose-Capillary: 166 mg/dL — ABNORMAL HIGH (ref 70–99)
Glucose-Capillary: 148 mg/dL — ABNORMAL HIGH (ref 70–99)

## 2024-10-21 MED ORDER — KETOROLAC TROMETHAMINE 15 MG/ML IJ SOLN: 15.0000 mg | Freq: Every day | INTRAMUSCULAR | Status: DC | PRN

## 2024-10-21 MED ORDER — LACTULOSE 10 GM/15ML PO SOLN
20.0000 g | Freq: Once | ORAL | Status: DC
  Filled 2024-10-21: qty 30

## 2024-10-21 MED ORDER — LOSARTAN POTASSIUM 25 MG PO TABS
12.5000 mg | ORAL_TABLET | Freq: Every day | ORAL | Status: DC
  Administered 2024-10-22 – 2024-10-23 (×2): 12.5 mg via ORAL
  Filled 2024-10-21 (×2): qty 1

## 2024-10-21 NOTE — Progress Notes (Signed)
 Physical Therapy Treatment Patient Details Name: [name] MRN: [id number] DOB: 10-22-1966 Today's Date: 10/21/2024   History of Present Illness Pt is a 58 y/o M admitted on 10/15/24 after presenting with R knee pain & L foot wounds. 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 Pt is s/p L foot transmet amputation on 10/17/24 by podiatry. PMH: IDDM2, CKD 3A, obesity, HLD, HTN, cocaine abuse, RLE TMA    PT Comments  Pt in bed.  Voices frustration over no rehab bed available and possible need to discharge home.  Pt encouraged to use wheelchair at home and no gait due to inability/non compliance with NWB status.  He initially stated he has not used WC before so one is brought to room where he stated he used one prior for his R foot.  He is able to squat pivot in and out of chair without assist.  Opts not to use RW and does again put weight on LLE despite education.  He stated he prefers not to use RW to take out a step  He is able to place leg rest on and educated on using it to help protect LE but he stated he would not use one at home.  He remains uninterested in [facility] and stated he will opt to use bathroom.  Pt voices concerns over going home but is not open to the idea of staying with family/friends for them to be available to assist with household mobility/ADL task I will go home if I have to  Pt generally upset and difficult to direct session but does apologize at end of session stating he is frustrated and doesn't understand why he cannot say for weeks until a bed is available.  Explained process and barriers.  Pt is in agreement he will need RW and wheelchair but stated he will obtain from his family.  Pt encouraged to secure DME prior to discharge if he does go home as both will be needed for a safe discharge home.  Encouraged wheelchair level activities at home and no gait.  Of note, CAM boot not in room to date.  Discussed with RN who will follow  through.  Pt encouraged to wear boot during transportation and in community for protection per MD orders.  Again stressed importance of maintaining NWB at all times as pt remains noncompliant.  Not interested in lateral scoot transfers to help maintain.     If plan is discharge home, recommend the following: A little help with walking and/or transfers;A little help with bathing/dressing/bathroom;Assist for transportation;Assistance with cooking/housework;Help with stairs or ramp for entrance   Can travel by private vehicle        Equipment Recommendations  Rolling walker (2 wheels);BSC/3in1;Wheelchair (measurements PT);Wheelchair cushion (measurements PT)    Recommendations for Other Services       Precautions / Restrictions Precautions Precautions: Fall Restrictions Weight Bearing Restrictions Per Provider Order: Yes LLE Weight Bearing Per Provider Order: Non weight bearing     Mobility  Bed Mobility Overal bed mobility: Modified Independent                  Transfers Overall transfer level: Modified independent Equipment used: None Transfers: Bed to chair/wheelchair/BSC       Squat pivot transfers: Modified independent (Device/Increase time)          Ambulation/Gait                   Stairs  [employer] mobility: Yes Wheelchair parts: Independent Wheelchair Assistance Details (indicate cue type and reason): reviewed chair safety.  he declined mobility but does transfer in and out of chair   Tilt Bed    Modified Rankin (Stroke Patients Only)       Balance Overall balance assessment: Needs assistance Sitting-balance support: Feet supported, Single extremity supported Sitting balance-Leahy Scale: Normal                                      Communication Communication Communication: No apparent difficulties  Cognition Arousal: Alert Behavior During Therapy: WFL for  tasks assessed/performed   PT - Cognitive impairments: Safety/Judgement                       PT - Cognition Comments: able to recall NWB and risks of WB but continues to during session despite reeducation Following commands: Intact Following commands impaired: Follows one step commands inconsistently    Cueing Cueing Techniques: Verbal cues  Exercises      General Comments        Pertinent Vitals/Pain Pain Assessment Pain Assessment: Faces Faces Pain Scale: Hurts a little bit Pain Location: Left foot, R knee Pain Descriptors / Indicators: Discomfort, Guarding Pain Intervention(s): Limited activity within patient's tolerance, Monitored during session, Repositioned    Home Living                          Prior Function            PT Goals (current goals can now be found in the care plan section) Progress towards PT goals: Progressing toward goals    Frequency    7X/week      PT Plan      Co-evaluation              AM-PAC PT 6 Clicks Mobility   Outcome Measure  Help needed turning from your back to your side while in a flat bed without using bedrails?: None Help needed moving from lying on your back to sitting on the side of a flat bed without using bedrails?: None Help needed moving to and from a bed to a chair (including a wheelchair)?: None Help needed standing up from a chair using your arms (e.g., wheelchair or bedside chair)?: A Little Help needed to walk in hospital room?: A Little Help needed climbing 3-5 steps with a railing? : A Lot 6 Click Score: 20    End of Session   Activity Tolerance: Patient tolerated treatment well;Treatment limited secondary to agitation Patient left: in bed;with call bell/phone within reach Nurse Communication: Mobility status;Other (comment) PT Visit Diagnosis: Other abnormalities of gait and mobility (R26.89);Difficulty in walking, not elsewhere classified (R26.2);Muscle weakness  (generalized) (M62.81)     Time: 0940-1000 PT Time Calculation (min) (ACUTE ONLY): 20 min  Charges:    $Therapeutic Activity: 8-22 mins PT General Charges $$ ACUTE PT VISIT: 1 Visit                   Lauraine Gills, PTA 10/21/24, 10:26 AM

## 2024-10-21 NOTE — Plan of Care (Signed)
Pt has been dc'd

## 2024-10-21 NOTE — Progress Notes (Addendum)
 Progress Note   Patient: [name] FMW:[id number] DOB: 23-Sep-1966 DOA: 10/15/2024     6 DOS: the patient was seen and examined on 10/21/2024   Brief hospital course: Per H&P HPI   HPI: [name] is a 58 y.o. 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      Patient has had pain in his right knee for at least the past 3 weeks.  He has had some falls due to his knee giving out .  He presented to [facility] 10/24.  MRI obtained at that time was inconclusive for osteomyelitis.  An arthrocentesis was attempted but no synovial fluid was able to be collected 1024.  Infectious disease was consulted and had low suspicion for septic arthritis.    He followed up with infectious disease 10/29.  They recommended given patient's persistent pain and concern for possible septic joint and a follow-up with orthopedic team for arthrocentesis. He underwent arthrocentesis which showed no crystals and 24K WBC with neutrophilic predominance. He was scheduled to follow up 11/13 for further treatment.  However he continued to have pain in his right knee.  He presented to the ED 11/8.  Repeat arthrocentesis was performed.  Synovial fluid analysis again showed white blood cells in the 20,000 range.  Synovial fluid cultures were sent which are no growth to date.  He had no crystals. He was again discharged with pain medication.    Patient presents again with swelling and pain in his R knee. He has no systemic symptoms of infection. He denies dyspnea, cough, diarrhea, dysuria.      Of note, patient was seen by podiatry team 11/10.  It was recommended at that time that patient presented to the ED for further management of his chronic left foot ulcers.  Patient has no pain from these.  During a previous hospitalization it was thought that patient had foul-smelling purulent  drainage from these.  The left foot showed chronic osteomyelitis during hospitalization 10/24.  He has completed antibiotics for this since that time.  Assessment and Plan: Non healing chronic L foot diabetic ulcer  Chronic osteomyelitis    Status post left TMA..  Podiatric surgery team patient has residual osteomyelitis.  Bone cultures growing Pseudomonas. - Continue cefepime  -Infectious disease consulted, follow recommendations   T2DM A1c 6.7 09/2024. Currently takes 22u of NPH twice daily as well as metformin .  CBG (last 3)  Recent Labs    10/21/24 0801 10/21/24 1143 10/21/24 1648  GLUCAP 134* 167* 166*   Blood glucose better controlled this a.m.  Will continue Lantus  44 units and 2 units of short acting with meals.     HTN Will resume home antihypertensive losartan  12-1/2 mg daily    10/21/2024    4:49 PM 10/21/2024    8:00 AM 10/21/2024    4:27 AM  Vitals with BMI  Systolic 127 130 881  Diastolic 70 70 58  Pulse 86 90 81    AKI resolved       Latest Ref Rng & Units 10/21/2024    5:19 AM 10/20/2024    3:16 AM 10/19/2024    7:28 AM  BMP  Glucose 70 - 99 mg/dL 826  735  689   BUN 6 - 20 mg/dL 17  16  21    Creatinine 0.61 - 1.24 mg/dL 8.81  8.92  8.92  Sodium 135 - 145 mmol/L 134  134  131   Potassium 3.5 - 5.1 mmol/L 5.0  4.8  4.6   Chloride 98 - 111 mmol/L 101  102  98   CO2 22 - 32 mmol/L 24  25  28    Calcium  8.9 - 10.3 mg/dL 8.5  8.3  8.4       Normocytic anemia  Hgb worse this a.m after the OR.. Iron  deficiency noted on labs ~9 months ago. Will need further work up for this outpatient.  -Continues to have Fe deficiency  -Continue PO iron     Hyperkalemia Resolved    Hyponatremia  Improved, mild, continue to monitor    HLD LDL 62 5 months ago.    Cocaine use Encourage cessation  R knee effusion Multiple recent arthrocenteses. Low concern for septic arthritis. Improved with acetaminophen  and oxycodone . Continue pain control, will need to  see outpatient orthopedic team.       Subjective: Pain is well-controlled, able to work with physical therapy this a.m.  Physical Exam: Vitals:   10/20/24 1942 10/21/24 0427 10/21/24 0800 10/21/24 1649  BP: 126/71 (!) 118/58 130/70 127/70  Pulse: 86 81 90 86  Resp: 17 20 17 16   Temp: 98.1 F (36.7 C) 98.6 F (37 C) 98.8 F (37.1 C) (!) 97.5 F (36.4 C)  TempSrc:      SpO2: 100% 100% 99% 100%  Weight:      Height:         Constitutional: In no distress.  Cardiovascular: Normal rate, regular rhythm. No lower extremity edema  Pulmonary: Non labored breathing on room air, no wheezing or rales.   Abdominal: Soft. Non distended and non tender Musculoskeletal: Status post right TMA, left foot bandage status post left TMA    Neurological: Alert and oriented to person, place, and time. Non focal  Skin: Skin is warm and dry.    Data Reviewed:     Latest Ref Rng & Units 10/20/2024    3:16 AM 10/19/2024    7:28 AM 10/18/2024    7:57 AM  CBC  WBC 4.0 - 10.5 K/uL 7.9  10.8  10.7   Hemoglobin 13.0 - 17.0 g/dL 8.0  7.8  8.4   Hematocrit 39.0 - 52.0 % 26.0  26.2  27.6   Platelets 150 - 400 K/uL 536  539  591       Latest Ref Rng & Units 10/21/2024    5:19 AM 10/20/2024    3:16 AM 10/19/2024    7:28 AM  BMP  Glucose 70 - 99 mg/dL 826  735  689   BUN 6 - 20 mg/dL 17  16  21    Creatinine 0.61 - 1.24 mg/dL 8.81  8.92  8.92   Sodium 135 - 145 mmol/L 134  134  131   Potassium 3.5 - 5.1 mmol/L 5.0  4.8  4.6   Chloride 98 - 111 mmol/L 101  102  98   CO2 22 - 32 mmol/L 24  25  28    Calcium  8.9 - 10.3 mg/dL 8.5  8.3  8.4      Family Communication: None at bedside.   Disposition: Status is: Inpatient Remains inpatient appropriate because: pending operation   Planned Discharge Destination: Pending SNF    Time spent: 35 minutes  Author: Alban Pepper, MD 10/21/2024 7:29 PM  For on call review [email or web address].

## 2024-10-21 NOTE — Plan of Care (Signed)

## 2024-10-21 NOTE — Plan of Care (Signed)
  Problem: Pain Managment: Goal: General experience of comfort will improve and/or be controlled 10/21/2024 1801 by Maranda Ellouise SAUNDERS, LPN Outcome: Progressing 10/21/2024 1729 by Maranda Ellouise SAUNDERS, LPN Outcome: Progressing   Problem: Safety: Goal: Ability to remain free from injury will improve 10/21/2024 1801 by Maranda Ellouise SAUNDERS, LPN Outcome: Progressing 10/21/2024 1729 by Maranda Ellouise SAUNDERS, LPN Outcome: Progressing   Problem: Clinical Measurements: Goal: Will remain free from infection Outcome: Progressing   Problem: Health Behavior/Discharge Planning: Goal: Ability to manage health-related needs will improve Outcome: Progressing   Problem: Education: Goal: Knowledge of General Education information will improve Description: Including pain rating scale, medication(s)/side effects and non-pharmacologic comfort measures Outcome: Progressing

## 2024-10-21 NOTE — Progress Notes (Signed)
 Date of Admission:  10/15/2024               ID: [name] is a 58 y.o. male  Principal Problem:   Non-healing ulcer of left foot (HCC)    Subjective: Patient is feeling better No pain  Medications:   diclofenac Sodium  2 g Topical QID   gabapentin   300 mg Oral BID   heparin   5,000 Units Subcutaneous Q8H   insulin  aspart  0-9 Units Subcutaneous TID WC   insulin  aspart  10 Units Subcutaneous TID WC   insulin  glargine-yfgn  44 Units Subcutaneous Q2200   iron  polysaccharides  150 mg Oral Daily   lactulose  20 g Oral Once   [START ON 10/22/2024] losartan   12.5 mg Oral Daily   polyethylene glycol  17 g Oral BID   senna  1 tablet Oral BID    Objective: Vital signs in last 24 hours: Patient Vitals for the past 24 hrs:  BP Temp Pulse Resp SpO2  10/21/24 1649 127/70 (!) 97.5 F (36.4 C) 86 16 100 %  10/21/24 0800 130/70 98.8 F (37.1 C) 90 17 99 %  10/21/24 0427 (!) 118/58 98.6 F (37 C) 81 20 100 %       PHYSICAL EXAM:  General: Alert, cooperative, no distress, appears stated age.  Lungs: Clear to auscultation bilaterally. No Wheezing or Rhonchi. No rales. Heart: Regular rate and rhythm, no murmur, rub or gallop. Abdomen: Soft, non-tender,not distended. Bowel sounds normal. No masses Extremities:  left foot Pictures reviewed     Skin: No rashes or lesions. Or bruising Lymph: Cervical, supraclavicular normal. Neurologic: Grossly non-focal  Lab Results    Latest Ref Rng & Units 10/20/2024    3:16 AM 10/19/2024    7:28 AM 10/18/2024    7:57 AM  CBC  WBC 4.0 - 10.5 K/uL 7.9  10.8  10.7   Hemoglobin 13.0 - 17.0 g/dL 8.0  7.8  8.4   Hematocrit 39.0 - 52.0 % 26.0  26.2  27.6   Platelets 150 - 400 K/uL 536  539  591        Latest Ref Rng & Units 10/21/2024    5:19 AM 10/20/2024    3:16 AM 10/19/2024    7:28 AM  CMP  Glucose 70 - 99 mg/dL 826  735  689   BUN 6 - 20 mg/dL 17  16  21    Creatinine 0.61 - 1.24 mg/dL 8.81  8.92  8.92   Sodium 135 - 145  mmol/L 134  134  131   Potassium 3.5 - 5.1 mmol/L 5.0  4.8  4.6   Chloride 98 - 111 mmol/L 101  102  98   CO2 22 - 32 mmol/L 24  25  28    Calcium  8.9 - 10.3 mg/dL 8.5  8.3  8.4       Microbiology: Surgical culture Pseudomonas and group B streptococcus Studies/Results: MRI of the left foot.  Plantar surface wound which communicates with the second metatarsal head.  There is destruction of the second metatarsal head and second proximal phalanx    Assessment/Plan: Chronic left foot infected wound, has trophic ulcers for the past 2 years He has  undergone multiple courses of antibiotics in the past Now has osteomyelitis involving the second toe Patient underwent TMA Cultures are Pseudomonas and group B streptococcus Patient is currently on cefepime  Await susceptibility Also await pathology to see for bone clearance Depending on the susceptibility we can decide  on p.o. antibiotic versus IV  Right knee subacute pain Not much of effusion He has undergone 3 aspirations of the knee in the past 2 weeks and all the cultures have been negative The pain is better and he is able to move the knee also better  Right  TMA  Diabetes mellitus: Management as per primary team  Anemia  Discussed the management with the patient

## 2024-10-21 NOTE — Plan of Care (Signed)
   Problem: Metabolic: Goal: Ability to maintain appropriate glucose levels will improve Outcome: Progressing

## 2024-10-21 NOTE — TOC Progression Note (Signed)
 Transition of Care [facility]) - Progression Note    Patient Details  Name: [name] MRN: [id number] Date of Birth: October 29, 1966  Transition of Care [facility]) CM/SW Contact  Victory Jackquline RAMAN, RN Phone Number: 10/21/2024, 4:27 PM  Clinical Narrative:   RNCM met with patient at the bedside and he informed me that he had spoke to Worley at [facility] and she said that they were going to accept him. RNCM reviewed bed offers and they did accept him. RNCM called [facility] @ [phone] and she confirmed that she did accept the patient and that she was going to start the insurance auth today and will call us  once she get auth approval. Patient made aware. RNCM will continue to follow for discharge planning needs.                      Expected Discharge Plan and Services                                               Social Drivers of Health (SDOH) Interventions SDOH Screenings   Food Insecurity: No Food Insecurity (10/15/2024)  Housing: Low Risk  (10/15/2024)  Transportation Needs: No Transportation Needs (10/15/2024)  Utilities: Not At Risk (10/15/2024)  Depression (PHQ2-9): Low Risk  (08/28/2023)  Financial Resource Strain: Patient Declined (06/28/2023)   Received from [place] [facility]  Social Connections: Moderately Integrated (06/26/2024)  Tobacco Use: Medium Risk (10/17/2024)  Health Literacy: Adequate Health Literacy (05/01/2024)    Readmission Risk Interventions     No data to display

## 2024-10-21 NOTE — TOC Progression Note (Addendum)
 Transition of Care [facility]) - Progression Note    Patient Details  Name: [name] MRN: [id number] Date of Birth: [birth year]  Transition of Care [facility]) CM/SW Contact  Racheal LITTIE Schimke, RN Phone Number: 10/21/2024, 1:23 PM  Clinical Narrative: CMRN spoke with [facility] [phone] about bed offer, she says she does not have a bed today and may have one tomorrow. Will review referral and decide, CM to call tomorrow. CM called brother Koren to give update.                       Expected Discharge Plan and Services                                               Social Drivers of Health (SDOH) Interventions SDOH Screenings   Food Insecurity: No Food Insecurity (10/15/2024)  Housing: Low Risk  (10/15/2024)  Transportation Needs: No Transportation Needs (10/15/2024)  Utilities: Not At Risk (10/15/2024)  Depression (PHQ2-9): Low Risk  (08/28/2023)  Financial Resource Strain: Patient Declined (06/28/2023)   Received from [place] [facility]  Social Connections: Moderately Integrated (06/26/2024)  Tobacco Use: Medium Risk (10/17/2024)  Health Literacy: Adequate Health Literacy (05/01/2024)    Readmission Risk Interventions     No data to display

## 2024-10-22 DIAGNOSIS — L97529 Non-pressure chronic ulcer of other part of left foot with unspecified severity: Secondary | ICD-10-CM | POA: Diagnosis not present

## 2024-10-22 LAB — BASIC METABOLIC PANEL WITH GFR
Anion gap: 7 (ref 5–15)
BUN: 16 mg/dL (ref 6–20)
CO2: 27 mmol/L (ref 22–32)
Calcium: 8.8 mg/dL — ABNORMAL LOW (ref 8.9–10.3)
Chloride: 101 mmol/L (ref 98–111)
Creatinine, Ser: 1.18 mg/dL (ref 0.61–1.24)
GFR, Estimated: >60 mL/min (ref >60)
Glucose, Bld: 137 mg/dL — ABNORMAL HIGH (ref 70–99)
Potassium: 4.8 mmol/L (ref 3.5–5.1)
Sodium: 135 mmol/L (ref 135–145)

## 2024-10-22 LAB — CBC WITH DIFFERENTIAL/PLATELET
Abs Immature Granulocytes: 0.09 K/uL — ABNORMAL HIGH (ref 0.00–0.07)
Basophils Absolute: 0 K/uL (ref 0.0–0.1)
Basophils Relative: 0 %
Eosinophils Absolute: 0.3 K/uL (ref 0.0–0.5)
Eosinophils Relative: 4 %
HCT: 28.7 % — ABNORMAL LOW (ref 39.0–52.0)
Hemoglobin: 8.6 g/dL — ABNORMAL LOW (ref 13.0–17.0)
Immature Granulocytes: 1 %
Lymphocytes Relative: 27 %
Lymphs Abs: 2.5 K/uL (ref 0.7–4.0)
MCH: 24.2 pg — ABNORMAL LOW (ref 26.0–34.0)
MCHC: 30 g/dL (ref 30.0–36.0)
MCV: 80.6 fL (ref 80.0–100.0)
Monocytes Absolute: 0.9 K/uL (ref 0.1–1.0)
Monocytes Relative: 9 %
Neutro Abs: 5.4 K/uL (ref 1.7–7.7)
Neutrophils Relative %: 59 %
Platelets: 543 K/uL — ABNORMAL HIGH (ref 150–400)
RBC: 3.56 MIL/uL — ABNORMAL LOW (ref 4.22–5.81)
RDW: 14.6 % (ref 11.5–15.5)
WBC: 9.1 K/uL (ref 4.0–10.5)
nRBC: 0 % (ref 0.0–0.2)

## 2024-10-22 LAB — GLUCOSE, CAPILLARY
Glucose-Capillary: 90 mg/dL (ref 70–99)
Glucose-Capillary: 82 mg/dL (ref 70–99)
Glucose-Capillary: 164 mg/dL — ABNORMAL HIGH (ref 70–99)
Glucose-Capillary: 165 mg/dL — ABNORMAL HIGH (ref 70–99)

## 2024-10-22 LAB — MAGNESIUM: Magnesium: 2 mg/dL (ref 1.7–2.4)

## 2024-10-22 MED ORDER — CIPROFLOXACIN HCL 500 MG PO TABS
500.0000 mg | ORAL_TABLET | Freq: Two times a day (BID) | ORAL | Status: DC
  Administered 2024-10-22 – 2024-10-23 (×4): 500 mg via ORAL
  Filled 2024-10-22 (×4): qty 1

## 2024-10-22 MED ORDER — AMOXICILLIN-POT CLAVULANATE 875-125 MG PO TABS
1.0000 | ORAL_TABLET | Freq: Two times a day (BID) | ORAL | Status: DC
  Administered 2024-10-22 – 2024-10-23 (×4): 1 via ORAL
  Filled 2024-10-22 (×4): qty 1

## 2024-10-22 MED ORDER — INSULIN GLARGINE-YFGN 100 UNIT/ML ~~LOC~~ SOLN
35.0000 [IU] | Freq: Every day | SUBCUTANEOUS | Status: DC
  Administered 2024-10-22: 35 [IU] via SUBCUTANEOUS
  Filled 2024-10-22 (×2): qty 0.35

## 2024-10-22 NOTE — Progress Notes (Signed)
 Occupational Therapy Treatment Patient Details Name: [name] MRN: [id number] DOB: Mar 02, 1966 Today's Date: 10/22/2024   History of present illness Pt is a 58 y/o M admitted on 10/15/24 after presenting with R knee pain & L foot wounds. 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 Pt is s/p L foot transmet amputation on 10/17/24 by podiatry. PMH: IDDM2, CKD 3A, obesity, HLD, HTN, cocaine abuse, RLE TMA   OT comments  [name] was seen for OT treatment on this date. Upon arrival to room pt in bed, pleasant and agreeable to tx. Pt requires MIN A don L CAM boot in sitting. SBA + RW sit<>stand and steps along EOB ~5 ft - requires repeated cues to maintain NWB as pt elects to heel weightbear initially. Poor carryover from prior education. Educated on d/c recs, DME recs, use of w/c to maintain NWBing, and falls prevention strategies. Pt making good progress toward goals, will continue to follow POC. Discharge recommendation remains appropriate.       If plan is discharge home, recommend the following:  A little help with walking and/or transfers;A little help with bathing/dressing/bathroom;Assistance with cooking/housework   Equipment Recommendations  None recommended by OT    Recommendations for Other Services      Precautions / Restrictions Precautions Precautions: Fall Recall of Precautions/Restrictions: Impaired Precaution/Restrictions Comments: CAM boot LLE Restrictions Weight Bearing Restrictions Per Provider Order: Yes LLE Weight Bearing Per Provider Order: Non weight bearing       Mobility Bed Mobility Overal bed mobility: Independent                  Transfers Overall transfer level: Needs assistance Equipment used: Rolling walker (2 wheels) Transfers: Sit to/from Stand Sit to Stand: Supervision                 Balance Overall balance assessment: Needs assistance Sitting-balance support: Feet  supported Sitting balance-Leahy Scale: Normal     Standing balance support: Bilateral upper extremity supported Standing balance-Leahy Scale: Fair                             ADL either performed or assessed with clinical judgement   ADL Overall ADL's : Needs assistance/impaired                                       General ADL Comments: MIN A don L CAM boot in sitting. SBA + RW for simulated BSC t/f - requires repeated cues to maintain NWB as pt elects to heel weightbear initially              Communication Communication Communication: No apparent difficulties   Cognition Arousal: Alert Behavior During Therapy: WFL for tasks assessed/performed Cognition: No apparent impairments                               Following commands: Intact          Pertinent Vitals/ Pain       Pain Assessment Pain Assessment: 0-10 Pain Score: 4  Pain Location: Left foot, R knee Pain Descriptors / Indicators: Discomfort, Guarding Pain Intervention(s): Limited activity within patient's tolerance, Repositioned   Frequency  Min 2X/week        Progress Toward Goals  OT Goals(current goals  can now be found in the care plan section)  Progress towards OT goals: Progressing toward goals  Acute Rehab OT Goals OT Goal Formulation: With patient Time For Goal Achievement: 11/01/24 Potential to Achieve Goals: Good ADL Goals Pt Will Perform Grooming: Independently Pt Will Perform Upper Body Dressing: Independently Pt Will Perform Lower Body Dressing: with modified independence Pt Will Transfer to Toilet: with modified independence  Plan      Co-evaluation                 AM-PAC OT 6 Clicks Daily Activity     Outcome Measure   Help from another person eating meals?: None Help from another person taking care of personal grooming?: A Little Help from another person toileting, which includes using toliet, bedpan, or urinal?: A  Little Help from another person bathing (including washing, rinsing, drying)?: A Little Help from another person to put on and taking off regular upper body clothing?: None Help from another person to put on and taking off regular lower body clothing?: A Little 6 Click Score: 20    End of Session    OT Visit Diagnosis: Muscle weakness (generalized) (M62.81)   Activity Tolerance Patient tolerated treatment well   Patient Left in bed;with call bell/phone within reach   Nurse Communication          Time: 8478-8464 OT Time Calculation (min): 14 min  Charges: OT General Charges $OT Visit: 1 Visit OT Treatments $Self Care/Home Management : 8-22 mins  Elston Slot, M.S. OTR/L  10/22/24, 3:47 PM  ascom [phone]

## 2024-10-22 NOTE — Progress Notes (Signed)
 Date of Admission:  10/15/2024               ID: [name] is a 58 y.o. male  Principal Problem:   Non-healing ulcer of left foot (HCC) Active Problems:   Infected ulcer of skin, with necrosis of bone (HCC)   Osteomyelitis of second toe (HCC)      Medications:   diclofenac Sodium  2 g Topical QID   gabapentin   300 mg Oral BID   heparin   5,000 Units Subcutaneous Q8H   insulin  aspart  0-9 Units Subcutaneous TID WC   insulin  aspart  10 Units Subcutaneous TID WC   insulin  glargine-yfgn  44 Units Subcutaneous Q2200   iron  polysaccharides  150 mg Oral Daily   lactulose  20 g Oral Once   losartan   12.5 mg Oral Daily   polyethylene glycol  17 g Oral BID   senna  1 tablet Oral BID    Objective: Vital signs in last 24 hours: Patient Vitals for the past 24 hrs:  BP Temp Temp src Pulse Resp SpO2  10/22/24 0730 128/75 98.7 F (37.1 C) -- 86 18 100 %  10/22/24 0453 121/76 98.9 F (37.2 C) Oral 86 18 100 %  10/21/24 2028 122/72 98.9 F (37.2 C) -- 88 16 100 %  10/21/24 1649 127/70 (!) 97.5 F (36.4 C) -- 86 16 100 %     Lab Results    Latest Ref Rng & Units 10/22/2024    3:10 AM 10/20/2024    3:16 AM 10/19/2024    7:28 AM  CBC  WBC 4.0 - 10.5 K/uL 9.1  7.9  10.8   Hemoglobin 13.0 - 17.0 g/dL 8.6  8.0  7.8   Hematocrit 39.0 - 52.0 % 28.7  26.0  26.2   Platelets 150 - 400 K/uL 543  536  539        Latest Ref Rng & Units 10/22/2024    3:10 AM 10/21/2024    5:19 AM 10/20/2024    3:16 AM  CMP  Glucose 70 - 99 mg/dL 862  826  735   BUN 6 - 20 mg/dL 16  17  16    Creatinine 0.61 - 1.24 mg/dL 8.81  8.81  8.92   Sodium 135 - 145 mmol/L 135  134  134   Potassium 3.5 - 5.1 mmol/L 4.8  5.0  4.8   Chloride 98 - 111 mmol/L 101  101  102   CO2 22 - 32 mmol/L 27  24  25    Calcium  8.9 - 10.3 mg/dL 8.8  8.5  8.3       Microbiology: Surgical culture Pseudomonas and group B streptococcus Studies/Results: MRI of the left foot.  Plantar surface wound which communicates with  the second metatarsal head.  There is destruction of the second metatarsal head and second proximal phalanx    Assessment/Plan: Chronic left foot infected wound, has trophic ulcers for the past 2 years He has  undergone multiple courses of antibiotics in the past Now has osteomyelitis involving the second toe Patient underwent TMA Cultures  Pseudomonas and group B streptococcus Patient is currently on cefepime  Bone margin clear of osteo- so patient can be sent on Cipro  500mg  BID and augmentin  875mg  BID for 2 weeks   Right knee subacute pain Not much of effusion He has undergone 3 aspirations of the knee in the past 2 weeks and all the cultures have been negative The pain is better and he is  able to move the knee also better  Right  TMA  Diabetes mellitus: Management as per primary team  Anemia  Discussed the management with patient and care team  Pt will follow up with podiatrist as OP No appt needed with me

## 2024-10-22 NOTE — TOC Progression Note (Signed)
 Transition of Care [facility]) - Progression Note    Patient Details  Name: [name] MRN: [id number] Date of Birth: Jul 21, 1966  [facility] CM/SW Contact  Alvaro Louder, [place] Phone Number: 10/22/2024, 9:02 AM  Clinical Narrative:   Shara started at [facility] for patient to admit when approved and medically cleared.   TOC to follow for discharge                      Expected Discharge Plan and Services                                               Social Drivers of Health (SDOH) Interventions SDOH Screenings   Food Insecurity: No Food Insecurity (10/15/2024)  Housing: Low Risk  (10/15/2024)  Transportation Needs: No Transportation Needs (10/15/2024)  Utilities: Not At Risk (10/15/2024)  Depression (PHQ2-9): Low Risk  (08/28/2023)  Financial Resource Strain: Patient Declined (06/28/2023)   Received from [place] [facility]  Social Connections: Moderately Integrated (06/26/2024)  Tobacco Use: Medium Risk (10/17/2024)  Health Literacy: Adequate Health Literacy (05/01/2024)    Readmission Risk Interventions     No data to display

## 2024-10-22 NOTE — Progress Notes (Signed)
 Physical Therapy Treatment Patient Details Name: [name] MRN: [id number] DOB: 1966-04-20 Today's Date: 10/22/2024   History of Present Illness Pt is a 58 y/o M admitted on 10/15/24 after presenting with R knee pain & L foot wounds. 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 Pt is s/p L foot transmet amputation on 10/17/24 by podiatry. PMH: IDDM2, CKD 3A, obesity, HLD, HTN, cocaine abuse, RLE TMA    PT Comments  Modified session due to inability to maintain L LE NWB during dynamic gait training with RW. Therefore session focused on education with properly donning CAM boot on L LE with MinA in sitting. Pt able to static stand at RW to maintain NWB for ~30 seconds then needs to rest heel on floor. Offered w/c mobility training, pt declined stating he was using his mom's at home as needed and did not need to practice. Will continue PT per acute plan.    If plan is discharge home, recommend the following: A little help with walking and/or transfers;A little help with bathing/dressing/bathroom;Assist for transportation;Assistance with cooking/housework;Help with stairs or ramp for entrance   Can travel by private vehicle     Yes  Equipment Recommendations  Rolling walker (2 wheels);BSC/3in1;Wheelchair (measurements PT);Wheelchair cushion (measurements PT) (Pt states he will use his mom's w/c at home)    Recommendations for Other Services       Precautions / Restrictions Precautions Precautions: Fall Recall of Precautions/Restrictions: Impaired Precaution/Restrictions Comments:  (Pt unable to maintain NWB R LE in dynamic standing) Required Braces or Orthoses: Other Brace (CAM Boot L LE) Restrictions Weight Bearing Restrictions Per Provider Order: Yes LLE Weight Bearing Per Provider Order: Non weight bearing Other Position/Activity Restrictions: precautions via secure chat with podiatry DM     Mobility  Bed Mobility Overal bed mobility:  Modified Independent Bed Mobility: Supine to Sit, Sit to Supine     Supine to sit: Modified independent (Device/Increase time) Sit to supine: Modified independent (Device/Increase time)        Transfers Overall transfer level: Modified independent Equipment used: Rolling walker (2 wheels) Transfers: Sit to/from Stand Sit to Stand: Supervision, Contact guard assist           General transfer comment:  (Pt sat EOB to don L Cam boot with instruction and MinA. Stood at [employer] for 1 minute, unable to progress gait due to inability to maintain L LE NWB)    Ambulation/Gait                   Stairs             Wheelchair Mobility     Tilt Bed    Modified Rankin (Stroke Patients Only)       Balance Overall balance assessment: Needs assistance Sitting-balance support: Feet supported Sitting balance-Leahy Scale: Normal     Standing balance support: Bilateral upper extremity supported, During functional activity, Reliant on assistive device for balance Standing balance-Leahy Scale: Fair                              Hotel Manager: No apparent difficulties  Cognition Arousal: Alert Behavior During Therapy: WFL for tasks assessed/performed   PT - Cognitive impairments: Safety/Judgement                       PT - Cognition Comments: able to recall NWB and risks of WB  but continues to during session despite reeducation Following commands: Intact Following commands impaired: Follows one step commands inconsistently    Cueing Cueing Techniques: Verbal cues  Exercises      General Comments General comments (skin integrity, edema, etc.):  (L foot with dressing intact, no visual drainage.)      Pertinent Vitals/Pain Pain Assessment Pain Assessment: Faces Faces Pain Scale: Hurts a little bit Pain Location: Left foot, R knee Pain Descriptors / Indicators: Discomfort, Guarding Pain Intervention(s): Limited  activity within patient's tolerance    Home Living                          Prior Function            PT Goals (current goals can now be found in the care plan section) Acute Rehab PT Goals Patient Stated Goal: go to rehab    Frequency    7X/week      PT Plan      Co-evaluation              AM-PAC PT 6 Clicks Mobility   Outcome Measure  Help needed turning from your back to your side while in a flat bed without using bedrails?: None Help needed moving from lying on your back to sitting on the side of a flat bed without using bedrails?: None Help needed moving to and from a bed to a chair (including a wheelchair)?: None Help needed standing up from a chair using your arms (e.g., wheelchair or bedside chair)?: A Little Help needed to walk in hospital room?: A Little Help needed climbing 3-5 steps with a railing? : A Lot 6 Click Score: 20    End of Session   Activity Tolerance: Patient tolerated treatment well Patient left: in bed;with call bell/phone within reach Nurse Communication: Mobility status PT Visit Diagnosis: Other abnormalities of gait and mobility (R26.89);Difficulty in walking, not elsewhere classified (R26.2);Muscle weakness (generalized) (M62.81)     Time: 1130-1146 PT Time Calculation (min) (ACUTE ONLY): 16 min  Charges:    $Therapeutic Activity: 8-22 mins PT General Charges $$ ACUTE PT VISIT: 1 Visit           Darice Bohr, PTA  Darice JAYSON Bohr 10/22/2024, 1:11 PM

## 2024-10-22 NOTE — Plan of Care (Signed)

## 2024-10-22 NOTE — Progress Notes (Signed)
 Progress Note   Patient: [name] FMW:[id number] DOB: January 26, 1966 DOA: 10/15/2024     7 DOS: the patient was seen and examined on 10/22/2024   Brief hospital course:  [name] is a 58 y.o. 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      Patient has had pain in his right knee for at least the past 3 weeks.  He has had some falls due to his knee giving out .  He presented to [facility] 10/24.  MRI obtained at that time was inconclusive for osteomyelitis.  An arthrocentesis was attempted but no synovial fluid was able to be collected 1024.  Infectious disease was consulted and had low suspicion for septic arthritis.    He followed up with infectious disease 10/29.  They recommended given patient's persistent pain and concern for possible septic joint and a follow-up with orthopedic team for arthrocentesis. He underwent arthrocentesis which showed no crystals and 24K WBC with neutrophilic predominance. He was scheduled to follow up 11/13 for further treatment.  However he continued to have pain in his right knee.  He presented to the ED 11/8.  Repeat arthrocentesis was performed.  Synovial fluid analysis again showed white blood cells in the 20,000 range.  Synovial fluid cultures were sent which are no growth to date.  He had no crystals. He was again discharged with pain medication.    Patient presents again with swelling and pain in his R knee. He has no systemic symptoms of infection. He denies dyspnea, cough, diarrhea, dysuria.      Of note, patient was seen by podiatry team 11/10.  It was recommended at that time that patient presented to the ED for further management of his chronic left foot ulcers.  Patient has no pain from these.  During a previous hospitalization it was thought that patient had foul-smelling purulent drainage from these.   The left foot showed chronic osteomyelitis during hospitalization 10/24.  He has completed antibiotics for this since that time.    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  Appreciate podiatry input. Per podiatry It is possible that acute infection from second metatarsal was resected, however given chronic wound to the plantar lateral aspect of the foot with progressive changes on imaging to the 5th metatarsal - patient will likely require antibiotic coverage for residual osteomyelitis.  Surgical pathology shows  SKIN WITH ULCERATION, ASSOCIATED NECROTIZING INFLAMMATION AND ACUTE OSTEOMYELITIS  BONE AT THE RESECTION MARGIN DOES NOT SHOW EVIDENCE OF ACUTE OSTEOMYELITIS  Surgical culture yields rare Pseudomonas aeruginosa and Streptococcus anginosus Appreciate ID input, patient is currently on Augmentin  and ciprofloxacin  to complete a 14-day course of therapy Podiatry recommends Non weight bearing to the LLE ; can put on CAM boot for transfer activities.       T2DM A1c 6.7 09/2024.  Blood sugars have been stable Hold NovoLog  Continue Semglee  but decrease dose to 35 units daily         HTN Stable on losartan    AKI  Resolved Renal function is back to baseline     Anemia of chronic disease Hemoglobin is stable   Hyperkalemia  Most likely medication induced, related to losartan  Resolved     Hyponatremia  Improved Monitor closely  Cocaine use Encourage cessation    R knee effusion Multiple recent arthrocenteses. Low concern for septic arthritis. Improved with acetaminophen  and oxycodone . Continue pain control, will need to see outpatient orthopedic team.           Subjective: No new complaints  Physical Exam: Vitals:   10/21/24 1649 10/21/24 2028 10/22/24 0453 10/22/24 0730  BP: 127/70 122/72 121/76 128/75  Pulse: 86 88 86 86  Resp:  16 16 18 18   Temp: (!) 97.5 F (36.4 C) 98.9 F (37.2 C) 98.9 F (37.2 C) 98.7 F (37.1 C)  TempSrc:   Oral   SpO2: 100% 100% 100% 100%  Weight:      Height:       Constitutional: In no distress.  Cardiovascular: Normal rate, regular rhythm. No lower extremity edema  Pulmonary: Non labored breathing on room air, no wheezing or rales.   Abdominal: Soft. Non distended and non tender Musculoskeletal: Status post right TMA, left foot bandage status post left TMA    Neurological: Alert and oriented to person, place, and time. Non focal  Skin: Skin is warm and dry.     Data Reviewed: Labs reviewed and within normal limits Labs reviewed  Family Communication: Plan of care discussed with patient at the bedside.  He verbalizes understanding and agrees with the plan  Disposition: Status is: Inpatient Remains inpatient appropriate because: Awaiting discharge to SNF  Planned Discharge Destination: Skilled nursing facility    Time spent: 50 minutes  Author: Aimee Somerset, MD 10/22/2024 1:21 PM  For on call review [email or web address].

## 2024-10-23 ENCOUNTER — Inpatient Hospital Stay

## 2024-10-23 ENCOUNTER — Other Ambulatory Visit: Payer: Self-pay

## 2024-10-23 DIAGNOSIS — L089 Local infection of the skin and subcutaneous tissue, unspecified: Secondary | ICD-10-CM | POA: Diagnosis not present

## 2024-10-23 DIAGNOSIS — E11628 Type 2 diabetes mellitus with other skin complications: Secondary | ICD-10-CM | POA: Diagnosis not present

## 2024-10-23 LAB — GLUCOSE, CAPILLARY
Glucose-Capillary: 163 mg/dL — ABNORMAL HIGH (ref 70–99)
Glucose-Capillary: 201 mg/dL — ABNORMAL HIGH (ref 70–99)
Glucose-Capillary: 213 mg/dL — ABNORMAL HIGH (ref 70–99)
Glucose-Capillary: 174 mg/dL — ABNORMAL HIGH (ref 70–99)

## 2024-10-23 LAB — AEROBIC/ANAEROBIC CULTURE W GRAM STAIN (SURGICAL/DEEP WOUND)

## 2024-10-23 MED ORDER — AMOXICILLIN-POT CLAVULANATE 875-125 MG PO TABS: 1.0000 | ORAL_TABLET | Freq: Two times a day (BID) | ORAL | 0 refills | Status: DC

## 2024-10-23 MED ORDER — INSULIN GLARGINE-YFGN 100 UNIT/ML ~~LOC~~ SOLN
40.0000 [IU] | Freq: Every day | SUBCUTANEOUS | 11 refills | Status: DC
  Filled 2024-10-23: qty 10, 25d supply, fill #0

## 2024-10-23 MED ORDER — OXYCODONE-ACETAMINOPHEN 5-325 MG PO TABS
1.0000 | ORAL_TABLET | Freq: Four times a day (QID) | ORAL | 0 refills | Status: AC | PRN
Start: 2024-10-23 — End: 2024-10-28

## 2024-10-23 MED ORDER — SENNA 8.6 MG PO TABS
1.0000 | ORAL_TABLET | Freq: Two times a day (BID) | ORAL | 0 refills | Status: DC
  Filled 2024-10-23: qty 120, 60d supply, fill #0

## 2024-10-23 MED ORDER — CIPROFLOXACIN HCL 500 MG PO TABS: 500.0000 mg | ORAL_TABLET | Freq: Two times a day (BID) | ORAL | 0 refills | Status: DC

## 2024-10-23 MED ORDER — LACTINEX PO CHEW
1.0000 | CHEWABLE_TABLET | Freq: Three times a day (TID) | ORAL | 0 refills | Status: DC
  Filled 2024-10-23: qty 42, 14d supply, fill #0

## 2024-10-23 MED ORDER — INSULIN GLARGINE-YFGN 100 UNIT/ML ~~LOC~~ SOLN
40.0000 [IU] | Freq: Every day | SUBCUTANEOUS | Status: DC
  Administered 2024-10-23: 40 [IU] via SUBCUTANEOUS
  Filled 2024-10-23: qty 0.4

## 2024-10-23 MED ORDER — POLYETHYLENE GLYCOL 3350 17 G PO PACK
17.0000 g | PACK | Freq: Two times a day (BID) | ORAL | 0 refills | Status: DC
  Filled 2024-10-23: qty 14, 7d supply, fill #0

## 2024-10-23 MED ORDER — OXYCODONE-ACETAMINOPHEN 5-325 MG PO TABS: 1.0000 | ORAL_TABLET | Freq: Four times a day (QID) | ORAL | 0 refills | Status: DC | PRN

## 2024-10-23 NOTE — Discharge Summary (Signed)
 Physician Discharge Summary   Patient: [name] MRN: [id number] DOB: 08-Jul-1966  Admit date:     10/15/2024  Discharge date: 10/23/24  Discharge Physician: Derward Marple   PCP: [name], MD   Recommendations at discharge:   Complete antibiotic therapy as recommended Follow-up with podiatry as an outpatient Nonweightbearing to left lower extremity, CAM boot on for transfer activities  Discharge Diagnoses: Principal Problem:   Diabetic foot infection (HCC) Active Problems:   Acute on chronic osteomyelitis (HCC)   Hyperkalemia   Chronic kidney disease, stage 3a (HCC)   Non-healing ulcer of left foot (HCC)   Infected ulcer of skin, with necrosis of bone (HCC)  Resolved Problems:   * No resolved hospital problems. *  Hospital Course: [name] is a 58 y.o. 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      Patient has had pain in his right knee for at least the past 3 weeks.  He has had some falls due to his knee giving out .  He presented to [facility] 10/24.  MRI obtained at that time was inconclusive for osteomyelitis.  An arthrocentesis was attempted but no synovial fluid was able to be collected 1024.  Infectious disease was consulted and had low suspicion for septic arthritis.    He followed up with infectious disease 10/29.  They recommended given patient's persistent pain and concern for possible septic joint and a follow-up with orthopedic team for arthrocentesis. He underwent arthrocentesis which showed no crystals and 24K WBC with neutrophilic predominance. He was scheduled to follow up 11/13 for further treatment.  However he continued to have pain in his right knee.  He presented to the ED 11/8.  Repeat arthrocentesis was performed.  Synovial fluid analysis again showed white blood cells in the 20,000 range.   Synovial fluid cultures were sent which are no growth to date.  He had no crystals. He was again discharged with pain medication.    Patient presents again with swelling and pain in his R knee. He has no systemic symptoms of infection. He denies dyspnea, cough, diarrhea, dysuria.      Of note, patient was seen by podiatry team 11/10.  It was recommended at that time that patient presented to the ED for further management of his chronic left foot ulcers.  Patient has no pain from these.  During a previous hospitalization it was thought that patient had foul-smelling purulent drainage from these.  The left foot showed chronic osteomyelitis during hospitalization 10/24.  He has completed antibiotics for this since that time.    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  Appreciate podiatry input. Per podiatry It is possible that acute infection from second metatarsal was resected, however given chronic wound to the plantar lateral aspect of the foot with progressive changes on imaging to the 5th metatarsal - patient will likely require antibiotic coverage for residual osteomyelitis.  Surgical pathology shows  SKIN WITH ULCERATION, ASSOCIATED NECROTIZING INFLAMMATION AND ACUTE OSTEOMYELITIS  BONE AT THE RESECTION MARGIN DOES NOT SHOW EVIDENCE OF ACUTE OSTEOMYELITIS  Surgical culture yields rare Pseudomonas aeruginosa and Streptococcus anginosus Appreciate ID input, patient is currently on Augmentin  and ciprofloxacin  to complete a 14-day course of therapy Podiatry recommends Non weight  bearing to the LLE ; can put on CAM boot for transfer activities.  Patient to follow-up with podiatry in 1 week   T2DM A1c 6.7 09/2024.  Blood sugars have been stable Continue NovoLog  with meals Continue Semglee  at 40 units daily Maintain consistent carbohydrate diet        HTN Continue  losartan      AKI  Resolved Renal function is back to baseline     Anemia of chronic disease Hemoglobin is stable     Hyperkalemia  Most likely medication induced, related to losartan  Resolved      Hyponatremia  Improved Monitor closely       Cocaine use Encourage cessation     R knee effusion Multiple recent arthrocenteses. Low concern for septic arthritis.  Improved with acetaminophen  and oxycodone  but not completely resolved.  Patient to follow-up with orthopedic surgery as an outpatient for steroid shots          Consultants: Podiatry, infectious disease Procedures performed:   Transmetatarsal amputation with complex rotational flap for primary closure, left foot Disposition: Skilled nursing facility Diet recommendation:  Cardiac and Carb modified diet DISCHARGE MEDICATION: Allergies as of 10/23/2024   No Known Allergies      Medication List     STOP taking these medications    Accu-Chek Guide w/Device Kit   Accu-Chek Softclix Lancets lancets   chlorhexidine  4 % external liquid Commonly known as: HIBICLENS    clindamycin 300 MG capsule Commonly known as: CLEOCIN   Ferrex 150 150 MG capsule Generic drug: iron  polysaccharides   HumuLIN  N KwikPen 100 UNIT/ML KwikPen Generic drug: Insulin  NPH (Human) (Isophane)   HYDROcodone -acetaminophen  5-325 MG tablet Commonly known as: NORCO/VICODIN   polyethylene glycol powder 17 GM/SCOOP powder Commonly known as: GLYCOLAX /MIRALAX  Replaced by: polyethylene glycol 17 g packet   sulfamethoxazole-trimethoprim 800-160 MG tablet Commonly known as: BACTRIM DS       TAKE these medications    Accu-Chek Guide Test test strip Generic drug: glucose blood Use to check blood sugar 3 times daily.   amoxicillin -clavulanate 875-125 MG tablet Commonly known as: AUGMENTIN  Take 1 tablet by mouth every 12 (twelve) hours for 14 days. What changed:  when to take this additional instructions   bisacodyl  5 MG  EC tablet Generic drug: bisacodyl  Take 2 tablets (10 mg total) by mouth at bedtime as needed for moderate constipation.   ciprofloxacin  500 MG tablet Commonly known as: CIPRO  Take 1 tablet (500 mg total) by mouth 2 (two) times daily for 14 days.   Dexcom G6 Transmitter Misc Use to check blood sugar three times daily. Change transmitter once every 909 days. E11.69   gabapentin  300 MG capsule Commonly known as: NEURONTIN  Take 1 capsule (300 mg total) by mouth 2 (two) times daily for neuropathy pain.   insulin  glargine-yfgn 100 UNIT/ML injection Commonly known as: SEMGLEE  Inject 0.4 mLs (40 Units total) into the skin daily at 10 pm.   lactobacillus acidophilus & bulgar chewable tablet Chew 1 tablet by mouth 3 (three) times daily with meals for 14 days.   losartan  25 MG tablet Commonly known as: COZAAR  Take 0.5 tablets (12.5 mg total) by mouth daily.   metFORMIN  500 MG 24 hr tablet Commonly known as: GLUCOPHAGE -XR Take 2 tablets (1,000 mg total) by mouth 2 (two) times daily with a meal.   naloxone 4 MG/0.1ML Liqd nasal spray kit Commonly known as: NARCAN Place 0.4 mg into the nose once.   NovoLOG  FlexPen 100 UNIT/ML FlexPen Generic drug: insulin  aspart  Inject 0-15 Units into the skin 3 (three) times daily before meals.  Correction coverage: Moderate (average weight, post-op) CBG 70 - 120: 0 units CBG 121 - 150: 2 units CBG 151 - 200: 3 units CBG 201 - 250: 5 units CBG 251 - 300: 8 units CBG 301 - 350: 11 units CBG 351 - 400: 15 units CBG > 400: call MD   oxyCODONE -acetaminophen  5-325 MG tablet Commonly known as: PERCOCET/ROXICET Take 1 tablet by mouth every 6 (six) hours as needed for up to 5 days for severe pain (pain score 7-10). What changed: when to take this   polyethylene glycol 17 g packet Commonly known as: MIRALAX  / GLYCOLAX  Take 17 g by mouth 2 (two) times daily. Replaces: polyethylene glycol powder 17 GM/SCOOP powder   senna 8.6 MG Tabs tablet Commonly  known as: SENOKOT Take 1 tablet (8.6 mg total) by mouth 2 (two) times daily.   TechLite Pen Needles 32G X 4 MM Misc Generic drug: Insulin  Pen Needle Use as directed to inject insulin  up to 4 times daily.   Vitamin D3 1.25 MG (50000 UT) Caps Take 1 capsule (50,000 Units total) by mouth every Monday.               Discharge Care Instructions  (From admission, onward)           Start     Ordered   10/23/24 0000  Discharge wound care:       Comments: Dressing Change Instructions:    Wound Location: LEFT TMA    Frequency of Dressing Changes: DAILY   Materials Needed: Adaptic (Clear mesh/ curad),  Betadine , 4x4 gauze, ABD pad Kerlix (white gauze roll), 4 inch ACE bandage.   Instructions: Paint wound/ area with betadine . Apply betadine  soaked adaptic (curad / clear mesh) across suture site to prevent pulling of the sutures. Cover with 4x4 gauze reinforced by overlying ABD pad. Anchor dressings with Kerlix (gauze roll) followed by loosely placed ACE bandage.    Please note: Adaptic is NOT xeroform - it should be the clear mesh dressing.   10/23/24 1336            Contact information for follow-up providers     Tanda Greig MATSU, DPM. Schedule an appointment as soon as possible for a visit in 1 week(s).   Specialty: Podiatry Contact information: [address] [place] [address] [phone]         [name], MD Follow up in 1 week(s).   Specialty: Family Medicine Contact information: [address] [place] [address] [place] [address] [phone]              Contact information for after-discharge care     Destination     [facility] .   Service: Skilled Nursing Contact information: [address] [place]  [address] [phone]                    Discharge Exam: Fredricka Weights   10/15/24 1304 10/17/24 1636  Weight: 104.3 kg 104.3 kg    Constitutional: In no distress.   Cardiovascular: Normal rate, regular rhythm. No lower extremity edema  Pulmonary: Non labored breathing on room air, no wheezing or rales.   Abdominal: Soft. Non distended and non tender Musculoskeletal: Status post right TMA, left foot bandage status post left TMA    Neurological: Alert and oriented to person, place, and time. Non focal  Skin: Skin is warm and dry.  Condition at discharge: stable  The results of significant diagnostics from this hospitalization (including imaging, microbiology, ancillary and laboratory) are listed below for reference.   Imaging Studies: DG MINI C-ARM IMAGE ONLY Result Date: 10/17/2024 There is no interpretation for this exam.  This order is for images obtained during a surgical procedure.  Please See Surgeries Tab for more information regarding the procedure.   MR FOOT LEFT W WO CONTRAST Result Date: 10/17/2024 MR FOOT WITHOUT THEN WITH IV CONTRAST LEFT COMPARISON: None. CLINICAL HISTORY: Wound, osteomyelitis. PULSE SEQUENCES: Ax T1, Ax T2 FS, Sag T1, Sag T2 FS, Cor STIR, Cor T1 with and without IV contrast. FINDINGS: Bones: There is a large wound in the plantar aspect of the foot which communicates with the second metatarsal head and third metatarsal head. There is destruction of the second and third metatarsal heads moderate reactive edema. There is also destruction proximal phalanx second toe consistent with osteomyelitis. Fluid surrounding the second metatarsal head likely poorly defined abscess. No significant collection is identified. Degenerative changes are seen in the first ray with a mild hallux valgus moderate degenerative arthrosis third metatarsophalangeal joint. The fifth distal metatarsal has the with sparing of the fifth toe. Musculotendinous structures: Extensor tendons are grossly intact. The flexor tendons are poorly defined. The flexor tendon of the second and third toes are not seen. This may be related to chronic disruption. Mild  cellulitis is present. IMPRESSION: Plantar surface wound which communicates with the second metatarsal head as above. There is destruction of the second metatarsal head and second proximal phalanx. There is remodeling of the third metatarsal head. Findings are consistent with osteomyelitis. There is fluid surrounding the second metatarsal head without evidence of a well-formed abscess. This is likely an infected collection. Post transmetatarsal amputation changes in the fifth ray as above. Degenerative changes in the remainder of the forefoot. Electronically signed by: Norleen Satchel MD 10/17/2024 09:17 AM EST RP Workstation: [id number]   DG Foot 2 Views Left Result Date: 10/15/2024 CLINICAL DATA:  Infection EXAM: LEFT FOOT - 2 VIEW COMPARISON:  Left foot x-ray 09/26/2024 FINDINGS: There is soft tissue swelling of the anterior foot and toes. No radiopaque foreign body identified. There are increasing lucencies within the distal second metatarsal. Chronic deformity of the distal fifth metatarsal with periosteal reaction appears unchanged. Difficult to evaluate the third and fourth metatarsophalangeal joint secondary to patient positioning. Plantar calcaneal spur identified. No acute fracture or dislocation identified. IMPRESSION: 1. Increasing lucencies within the distal second metatarsal concerning for osteomyelitis. 2. Chronic deformity of the distal fifth metatarsal with periosteal reaction appears unchanged. 3. Soft tissue swelling of the anterior foot and toes. Electronically Signed   By: Greig Pique M.D.   On: 10/15/2024 17:15   MR KNEE RIGHT W WO CONTRAST Result Date: 09/28/2024 CLINICAL DATA:  Septic arthritis suspected, knee, xray done Reported right knee pain, swelling and erythema for 4 days on radiographs performed 2 days ago. EXAM: MRI OF THE RIGHT KNEE WITHOUT AND WITH CONTRAST TECHNIQUE: Multiplanar, multisequence MR imaging of the right knee was performed both before and after administration  of intravenous contrast. CONTRAST:  10mL GADAVIST  GADOBUTROL  1 MMOL/ML IV SOLN COMPARISON:  Radiographs 09/26/2024 FINDINGS: Technical note: Despite efforts by the technologist and patient, mild motion artifact is present on today's exam and could not be eliminated. This reduces exam sensitivity and specificity. MENISCI Medial meniscus:  Intact with normal morphology. Lateral meniscus:  Intact with normal morphology. LIGAMENTS Cruciates: The anterior and posterior cruciate ligaments are  intact. Collaterals: The medial and lateral collateral ligament complexes are intact. CARTILAGE Patellofemoral: Small nearly full-thickness chondral defect superiorly in the medial patellar facet. Mild chondral thinning and surface irregularity of the trochlear cartilage. Medial:  Mild chondral thinning and surface irregularity. Lateral:  Preserved. MISCELLANEOUS Joint: Nonspecific moderate-sized knee joint effusion with synovial enhancement following contrast. Popliteal Fossa: The popliteus muscle and tendon are intact. Small Baker's cyst. Mild edema in the popliteal fossa without other focal fluid collection. Extensor Mechanism: The visualized quadriceps and patellar tendons are intact. Bones: No evidence of acute fracture or dislocation. No erosive changes or subchondral edema to suggest osteomyelitis. Other: Nonspecific soft tissue edema surrounding the knee, greatest anterolaterally. Probable associated subcutaneous enhancement which may indicate cellulitis. No focal fluid collection or foreign body identified. IMPRESSION: 1. Nonspecific moderate-sized knee joint effusion with synovial enhancement following contrast. Septic arthritis not excluded. Consider joint aspiration. 2. Nonspecific soft tissue edema surrounding the knee, greatest anterolaterally. Probable associated subcutaneous enhancement which may indicate cellulitis. No focal fluid collection or foreign body identified. 3. No evidence of osteomyelitis. 4. The menisci,  cruciate and collateral ligaments are intact. 5. Mild medial and patellofemoral compartment degenerative chondrosis. Electronically Signed   By: Elsie Perone M.D.   On: 09/28/2024 12:04   MR FOOT LEFT W WO CONTRAST Result Date: 09/28/2024 CLINICAL DATA:  Osteonecrosis suspected, foot, xray done History of left foot irrigation and debridement procedure 06/29/2024. EXAM: MRI OF THE LEFT FOREFOOT WITHOUT AND WITH CONTRAST TECHNIQUE: Multiplanar, multisequence MR imaging of the left forefoot was performed both before and after administration of intravenous contrast. CONTRAST:  10mL GADAVIST  GADOBUTROL  1 MMOL/ML IV SOLN COMPARISON:  Radiographs 09/26/2024 and 06/29/2024. MRI 06/21/2024 and CT 03/25/2024. FINDINGS: Technical note: Despite efforts by the technologist and patient, mild motion artifact is present on today's exam and could not be eliminated. This reduces exam sensitivity and specificity. Bones/Joint/Cartilage Progressive soft tissue ulcer or surgical wound involving the plantar aspect of the forefoot at the level of the 2nd metatarsal. Soft tissue findings are further described below. Underlying chronic ankylosis of the 2nd metatarsophalangeal joint with chronic deformities of the distal 2nd through 5th metatarsals. Compared with the previous MRI, there is progressive cortical destruction along the fused 2nd metatarsophalangeal joint with increased T2 hyperintensity, decreased T1 hyperintensity and heterogeneous bone marrow enhancement, consistent with progressive osteomyelitis. Signal changes within the 3rd metatarsal head and base of the 3rd proximal phalanx are similar to previous MRI, likely indicative of chronic osteomyelitis. Likewise, chronic deformity of the distal 5th metatarsal with associated chronic marrow changes, also likely related to chronic osteomyelitis. No other progressive bone destruction identified. Ligaments Intact Lisfranc ligament. The collateral ligaments of the 2nd, 3rd and  5th metatarsophalangeal joints are not well visualized. Muscles and Tendons Mild generalized muscular edema and atrophy. No significant tenosynovitis identified. Soft tissues As above, enlarging soft tissue ulcer or surgical wound along the plantar aspect of the 2nd metatarsal head extending to the fused 2nd metatarsophalangeal joint. There are progressive soft tissue inflammatory changes surrounding the 2nd metatarsophalangeal joint and 2nd proximal phalanx with a complex fluid collection plantar to the 2nd proximal interphalangeal joint. This measures up to 1.4 cm in diameter and is associated with air bubbles and peripheral and enhancement, suspicious for a small abscess. Additional chronic soft tissue ulcer along the plantar aspect of the distal 5th metatarsal. Chronic dermal and subcutaneous thickening in the medial plantar aspect of the forefoot. No other focal fluid collections are identified. IMPRESSION: 1. Enlarging soft tissue ulcer  or surgical wound along the plantar aspect of the 2nd metatarsal head extending to the fused 2nd metatarsophalangeal joint. There are progressive soft tissue inflammatory changes surrounding the 2nd metatarsophalangeal joint and 2nd proximal phalanx with a small complex fluid collection plantar to the 2nd proximal interphalangeal joint, suspicious for a small abscess. 2. Progressive cortical destruction along the fused 2nd metatarsophalangeal joint consistent with progressive osteomyelitis. 3. Chronic osteomyelitis of the 3rd metatarsal head, base of the 3rd proximal phalanx and distal 5th metatarsal, similar to previous MRI. 4. Additional chronic soft tissue ulcer along the plantar aspect of the distal 5th metatarsal. Electronically Signed   By: Elsie Perone M.D.   On: 09/28/2024 11:04   DG Foot Complete Left Result Date: 09/26/2024 CLINICAL DATA:  Fall EXAM: LEFT FOOT - COMPLETE 3+ VIEW COMPARISON:  06/29/2024, 03/25/2024 FINDINGS: Large ulcer/wound plantar aspect of  the mid to distal foot. No fracture is seen. Chronic deformity of the fifth metatarsal. Chronic deformities of the second third and fourth metatarsal heads. Interval increased periosteal reaction at the head of the first metatarsal and base of first proximal phalanx on the lateral side. There appears to be ankylosis across the second and third MTP joints. Lucency at the level of the second and third MTP joint/metatarsal head/bases of the second and third proximal phalanges on the oblique view, some of which may be due to soft tissue ulcer. Sclerosis of the second third and fifth metatarsals which may be due to chronic osteomyelitis. Possible increased periosteal reaction along the shaft of the fifth metatarsal on lateral view. IMPRESSION: 1. Large ulcer/wound plantar aspect of the mid to distal foot. No acute fracture is seen. 2. Interval increased periosteal reaction at the head of the first metatarsal and base of first proximal phalanx on the lateral side, possible osteomyelitis. 3. Chronic deformities of the second through fifth metatarsals. Sclerosis of the second third and fifth metatarsal suggesting chronic osteomyelitis. Lucency overlying the second and third MTP joints/heads of metatarsals/bases of proximal phalanges probably in part due to superimposed soft tissue ulcer but cannot exclude erosive change related to osteomyelitis. Consider MRI follow-up. Electronically Signed   By: Luke Bun M.D.   On: 09/26/2024 15:52   DG Knee Complete 4 Views Right Result Date: 09/26/2024 EXAM: 4 OR MORE VIEW(S) XRAY OF THE R KNEE 09/26/2024 02:49:00 PM COMPARISON: None available. CLINICAL HISTORY: R knee pain and swelling, erythema. Per triage notes: Patient has R knee pain without injury x 4 days, causing him to fall twice yesterday. Did not hit head or pass out and denies other injury from falls. FINDINGS: BONES AND JOINTS: No acute fracture. No focal osseous lesion. No joint dislocation. No significant joint  effusion. No significant degenerative changes. SOFT TISSUES: The soft tissues are unremarkable. IMPRESSION: 1. No acute osseous abnormality of the knee. Electronically signed by: Selinda Blue MD 09/26/2024 03:21 PM EDT RP Workstation: [id number]    Microbiology: Results for orders placed or performed during the hospital encounter of 10/15/24  Blood culture (routine x 2)     Status: None   Collection Time: 10/15/24  3:01 PM   Specimen: BLOOD  Result Value Ref Range Status   Specimen Description BLOOD BLOOD LEFT FOREARM  Final   Special Requests   Final    BOTTLES DRAWN AEROBIC AND ANAEROBIC Blood Culture adequate volume   Culture   Final    NO GROWTH 5 DAYS Performed at [facility], [address], [place], [place] [address]    Report  Status 10/20/2024 FINAL  Final  Blood culture (routine x 2)     Status: None   Collection Time: 10/15/24  3:01 PM   Specimen: BLOOD  Result Value Ref Range Status   Specimen Description BLOOD BLOOD LEFT ARM  Final   Special Requests   Final    BOTTLES DRAWN AEROBIC AND ANAEROBIC Blood Culture adequate volume   Culture   Final    NO GROWTH 5 DAYS Performed at [facility], [address], [place], [place] [address]    Report Status 10/20/2024 FINAL  Final  Aerobic/Anaerobic Culture w Gram Stain (surgical/deep wound)     Status: None   Collection Time: 10/17/24  5:56 PM   Specimen: Foot, Left; Tissue  Result Value Ref Range Status   Specimen Description   Final    WOUND Performed at [facility], [address] [place], [place] [address]    Special Requests   Final    LEFT FOOT Performed at [facility], [address] [place], [place] [address]    Gram Stain   Final    RARE WBC PRESENT, PREDOMINANTLY PMN RARE GRAM POSITIVE COCCI    Culture   Final    RARE PSEUDOMONAS AERUGINOSA RARE STREPTOCOCCUS ANGINOSIS SUSCEPTIBILITIES PERFORMED ON PREVIOUS CULTURE WITHIN THE LAST 5 DAYS. NO ANAEROBES  ISOLATED Performed at [facility], [address] [place], [place] [address]    Report Status 10/23/2024 FINAL  Final   Organism ID, Bacteria STREPTOCOCCUS ANGINOSIS  Final      Susceptibility   Streptococcus anginosis - MIC*    PENICILLIN  <=0.06 SENSITIVE Sensitive     CEFTRIAXONE  <=0.12 SENSITIVE Sensitive     ERYTHROMYCIN >=8 RESISTANT Resistant     LEVOFLOXACIN  0.5 SENSITIVE Sensitive     VANCOMYCIN  0.5 SENSITIVE Sensitive     * RARE STREPTOCOCCUS ANGINOSIS  Aerobic/Anaerobic Culture w Gram Stain (surgical/deep wound)     Status: None   Collection Time: 10/17/24  6:47 PM   Specimen: Foot, Left; Tissue  Result Value Ref Range Status   Specimen Description   Final    TISSUE Performed at [facility], [address], [place], [place] [address]    Special Requests   Final    LEFT FOOT Performed at [facility], [address] [place], [place] [address]    Gram Stain   Final    RARE WBC PRESENT, PREDOMINANTLY PMN NO ORGANISMS SEEN    Culture   Final    RARE PSEUDOMONAS AERUGINOSA RARE STREPTOCOCCUS GROUP G Beta hemolytic streptococci are predictably susceptible to penicillin  and other beta lactams. Susceptibility testing not routinely performed. NO ANAEROBES ISOLATED Performed at [facility], [address], [place], [place] [address]    Report Status 10/23/2024 FINAL  Final   Organism ID, Bacteria PSEUDOMONAS AERUGINOSA  Final      Susceptibility   Pseudomonas aeruginosa - MIC*    MEROPENEM  >=16 RESISTANT Resistant     CIPROFLOXACIN  0.25 SENSITIVE Sensitive     IMIPENEM >=16 RESISTANT Resistant     CEFTAZIDIME/AVIBACTAM 2 SENSITIVE Sensitive     CEFTOLOZANE/TAZOBACTAM 1 SENSITIVE Sensitive     TOBRAMYCIN <=1 SENSITIVE Sensitive     CEFTAZIDIME >=32 RESISTANT Resistant     * RARE PSEUDOMONAS AERUGINOSA    Labs: CBC: Recent Labs  Lab 10/17/24 0416 10/18/24 0757 10/19/24 0728 10/20/24 0316 10/22/24 0310  WBC 7.4 10.7* 10.8*  7.9 9.1  NEUTROABS  --   --   --  5.7 5.4  HGB 8.0* 8.4* 7.8* 8.0* 8.6*  HCT 26.1* 27.6* 26.2* 26.0* 28.7*  MCV 80.1 78.9* 80.4 79.0* 80.6  PLT 523* 591* 539* 536* 543*   Basic Metabolic Panel: Recent Labs  Lab 10/18/24 0757 10/18/24 1238 10/19/24 0728 10/20/24 0316 10/21/24 0519 10/22/24 0310  NA 128* 129* 131* 134* 134* 135  K 5.6* 5.1 4.6 4.8 5.0 4.8  CL 97* 97* 98 102 101 101  CO2 25 23 28 25 24 27   GLUCOSE 447* 327* 310* 264* 173* 137*  BUN 27* 26* 21* 16 17 16   CREATININE 1.35* 1.20 1.07 1.07 1.18 1.18  CALCIUM  8.3* 8.3* 8.4* 8.3* 8.5* 8.8*  MG 2.1  --   --   --   --  2.0   Liver Function Tests: No results for input(s): AST, ALT, ALKPHOS, BILITOT, PROT, ALBUMIN in the last 168 hours. CBG: Recent Labs  Lab 10/22/24 1134 10/22/24 1714 10/22/24 2107 10/23/24 0822 10/23/24 1151  GLUCAP 82 164* 165* 163* 201*    Discharge time spent: greater than 30 minutes.  Signed: Aimee Somerset, MD [facility] 10/23/2024

## 2024-10-23 NOTE — Progress Notes (Signed)
 Progress Note   Patient: [name] FMW:[id number] DOB: 29-May-1966 DOA: 10/15/2024     8 DOS: the patient was seen and examined on 10/23/2024   Brief hospital course: [name] is a 58 y.o. 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      Patient has had pain in his right knee for at least the past 3 weeks.  He has had some falls due to his knee giving out .  He presented to [facility].  MRI obtained at that time was inconclusive for osteomyelitis.  An arthrocentesis was attempted but no synovial fluid was able to be collected 1024.  Infectious disease was consulted and had low suspicion for septic arthritis.    He followed up with infectious disease 10/29.  They recommended given patient's persistent pain and concern for possible septic joint and a follow-up with orthopedic team for arthrocentesis. He underwent arthrocentesis which showed no crystals and 24K WBC with neutrophilic predominance. He was scheduled to follow up 11/13 for further treatment.  However he continued to have pain in his right knee.  He presented to the ED 11/8.  Repeat arthrocentesis was performed.  Synovial fluid analysis again showed white blood cells in the 20,000 range.  Synovial fluid cultures were sent which are no growth to date.  He had no crystals. He was again discharged with pain medication.    Patient presents again with swelling and pain in his R knee. He has no systemic symptoms of infection. He denies dyspnea, cough, diarrhea, dysuria.      Of note, patient was seen by podiatry team 11/10.  It was recommended at that time that patient presented to the ED for further management of his chronic left foot ulcers.  Patient has no pain from these.  During a previous hospitalization it was thought that patient had foul-smelling purulent drainage from these.  The  left foot showed chronic osteomyelitis during hospitalization 10/24.  He has completed antibiotics for this since that time.     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  Appreciate podiatry input. Per podiatry It is possible that acute infection from second metatarsal was resected, however given chronic wound to the plantar lateral aspect of the foot with progressive changes on imaging to the 5th metatarsal - patient will likely require antibiotic coverage for residual osteomyelitis.  Surgical pathology shows  SKIN WITH ULCERATION, ASSOCIATED NECROTIZING INFLAMMATION AND ACUTE OSTEOMYELITIS  BONE AT THE RESECTION MARGIN DOES NOT SHOW EVIDENCE OF ACUTE OSTEOMYELITIS  Surgical culture yields rare Pseudomonas aeruginosa and Streptococcus anginosus Appreciate ID input, patient is currently on Augmentin  and ciprofloxacin  to complete a 14-day course of therapy Podiatry recommends Non weight bearing to the LLE ; can put on CAM boot for transfer activities.        T2DM A1c 6.7 09/2024.  Blood sugars have been stable Continue NovoLog  with meals Continue Semglee  at 40 units daily          HTN Continue losartan      AKI  Resolved Renal function is back to baseline     Anemia of chronic disease Hemoglobin is stable    Hyperkalemia  Most likely medication induced, related to losartan  Resolved      Hyponatremia  Improved Monitor closely       Cocaine use Encourage cessation     R knee effusion Multiple recent arthrocenteses. Low concern for septic arthritis.  Improved with acetaminophen  and oxycodone  but not completely resolved.  Patient to follow-up with orthopedic surgery as an outpatient for steroid shots          Subjective: Complains of pain in his right knee.  Rates his pain an 8 x 10 in intensity at its worst  Physical Exam: Vitals:    10/22/24 1543 10/22/24 2003 10/23/24 0411 10/23/24 0824  BP: 110/73 (!) 140/68 115/76 109/69  Pulse: 84 84 91 81  Resp: 16 18 19 18   Temp: 98.2 F (36.8 C) 98.1 F (36.7 C) 100 F (37.8 C) 98.7 F (37.1 C)  TempSrc: Oral     SpO2: 99% 100% 99% 100%  Weight:      Height:       Constitutional: In no distress.  Cardiovascular: Normal rate, regular rhythm. No lower extremity edema  Pulmonary: Non labored breathing on room air, no wheezing or rales.   Abdominal: Soft. Non distended and non tender Musculoskeletal: Status post right TMA, left foot bandage status post left TMA    Neurological: Alert and oriented to person, place, and time. Non focal  Skin: Skin is warm and dry.    Data Reviewed:  There are no new results to review at this time.  Family Communication: Plan of care discussed with patient in detail.  Disposition: Status is: Inpatient Remains inpatient appropriate because: Awaiting discharge to SNF  Planned Discharge Destination: Skilled nursing facility    Time spent: 40 minutes  Author: Aimee Somerset, MD 10/23/2024 12:14 PM  For on call review [email or web address].

## 2024-10-23 NOTE — TOC Transition Note (Addendum)
 Transition of Care [facility]) - Discharge Note   Patient Details  Name: [name] MRN: [id number] Date of Birth: [birth year]  Transition of Care [facility]) CM/SW Contact:  Alvaro Louder, LCSW Phone Number: 10/23/2024, 3:45 PM   Clinical Narrative:     LCSWA received insurance approval for patient to admit to SNF [facility]. LCSWA confirmed with MD that patient is stable for discharge. LCSWA notified the patient and they are in agreement with discharge. LCSWA confirmed bed is available at SNF. Transport arranged by Medicaid with [facility] for next available. Transport auth number [id number] [facility])  Rm 317 Number to call report [phone]   [facility] signing off  Final next level of care: Skilled Nursing Facility Barriers to Discharge: No Barriers Identified   Patient Goals and CMS Choice            Discharge Placement              Patient chooses bed at:  [facility]) Patient to be transferred to facility by: [facility] Name of family member notified: Self Patient and family notified of of transfer: 10/23/24  Discharge Plan and Services Additional resources added to the After Visit Summary for                                       Social Drivers of Health (SDOH) Interventions SDOH Screenings   Food Insecurity: No Food Insecurity (10/15/2024)  Housing: Low Risk  (10/15/2024)  Transportation Needs: No Transportation Needs (10/15/2024)  Utilities: Not At Risk (10/15/2024)  Depression (PHQ2-9): Low Risk  (08/28/2023)  Financial Resource Strain: Patient Declined (06/28/2023)   Received from [facility]  Social Connections: Moderately Integrated (06/26/2024)  Tobacco Use: Medium Risk (10/17/2024)  Health Literacy: Adequate Health Literacy (05/01/2024)     Readmission Risk Interventions     No data to display

## 2024-10-23 NOTE — Plan of Care (Signed)

## 2024-10-23 NOTE — Discharge Instructions (Signed)
 Dr. Tanda Post-Operative Instructions    Dressing:  Dressing Change Instructions Wound Location: Left foot Frequency of Dressing Changes: Daily  Materials Needed: Adaptic (clear mesh) Betadine , 4x4 gauze, ABD pad Kerlix (white gauze roll), 4 inch ACE bandage. Instructions: Paint suture site with betadine . Apply betadine  soaked adaptic mesh over the suture sites to prevent sticking of the gauze to the suture line. Cover with 4x4 gauze reinforced by overlying ABD pad. Anchor dressings with Kerlix (gauze roll) followed by loosely placed ACE bandage.  PLEASE NOTE: Adaptic is CLEAR mesh - it is NOT XEROFORM.    Showering: Showers are acceptable if you use a cast sock or garbage bag around the surgical leg. DO NOT get dressing wet. You may fully shower 24 hours after the sutures are removed. Your sutures are usually removed at post-operative day 21. Once the sutures are removed in clinic, we will place steri-strip bandages over the incision; leave these on until they peel off on their own. Do not soak the foot or ankle in a tub or submerge it. You need to keep your incisions as dry as possible. After showering, make sure that you carefully dry the surgical limb by patting down with towel gently.     Bleeding: Many times, there is some oozing from the incisions under the dressing and there may be some mild, bloody drainage through the bandage and this is expected.   Swelling: Swelling is common to experience especially around the ankle and foot. Elevation with two pillows under the surgical extremity is the most important thing to do to decrease the swelling. Because of the swelling you may have some numbness in the foot. Foot and ankle swelling can persist for about 9-12 months.     Activities: NON- WEIGHTBEARING on the LEFT foot ; STRICT non-weightbearing.    Postoperative office visit: 1 week for site check     Signs of Infection: With any surgery it is important to be aware of signs of  infection, which can include: unusual looking incision such as increased redness or smell, drainage to be green or yellow, and increased fever. It is normal to have a slight temperature post operative, but above 101.5 degrees, you should contact our office.      Signs of a DVT: If you experience swelling in your calf on the operative leg or pain in your calf with flexion of the foot or with weight bearing, please contact our office immediately. If this occurs at night or over the weekend, please make your way to the closest emergency room immediately for an ultrasound to rule out a blood clot.   *Aspirin  use: If you are over the age of 41 or are a male of any age taking birth control pills, you are instructed to take an Aspirin  325mg  daily after surgery for 4 weeks post op. This is done to help prevent the formation of a blood clot. (This is only for patients who are completely Non-Weight bearing)       If you have any other questions or concerns, please call the office [phone]

## 2024-11-03 ENCOUNTER — Emergency Department

## 2024-11-03 ENCOUNTER — Encounter: Payer: Self-pay | Admitting: *Deleted

## 2024-11-03 ENCOUNTER — Other Ambulatory Visit: Payer: Self-pay

## 2024-11-03 ENCOUNTER — Inpatient Hospital Stay
Admission: EM | Admit: 2024-11-03 | Discharge: 2024-11-08 | DRG: 464 | Disposition: A | Discharge status: Skilled Nursing Facility | Attending: Emergency Medicine | Admitting: Emergency Medicine

## 2024-11-03 DIAGNOSIS — E669 Obesity, unspecified: Secondary | ICD-10-CM | POA: Diagnosis present

## 2024-11-03 DIAGNOSIS — L03116 Cellulitis of left lower limb: Secondary | ICD-10-CM

## 2024-11-03 DIAGNOSIS — Z6836 Body mass index (BMI) 36.0-36.9, adult: Secondary | ICD-10-CM | POA: Diagnosis not present

## 2024-11-03 DIAGNOSIS — M25561 Pain in right knee: Secondary | ICD-10-CM | POA: Diagnosis not present

## 2024-11-03 DIAGNOSIS — T148XXA Other injury of unspecified body region, initial encounter: Secondary | ICD-10-CM | POA: Diagnosis not present

## 2024-11-03 DIAGNOSIS — T8149XA Infection following a procedure, other surgical site, initial encounter: Secondary | ICD-10-CM | POA: Diagnosis not present

## 2024-11-03 DIAGNOSIS — L089 Local infection of the skin and subcutaneous tissue, unspecified: Secondary | ICD-10-CM | POA: Diagnosis not present

## 2024-11-03 DIAGNOSIS — Z7984 Long term (current) use of oral hypoglycemic drugs: Secondary | ICD-10-CM | POA: Diagnosis not present

## 2024-11-03 DIAGNOSIS — Y835 Amputation of limb(s) as the cause of abnormal reaction of the patient, or of later complication, without mention of misadventure at the time of the procedure: Secondary | ICD-10-CM | POA: Diagnosis present

## 2024-11-03 DIAGNOSIS — Z79899 Other long term (current) drug therapy: Secondary | ICD-10-CM | POA: Diagnosis not present

## 2024-11-03 DIAGNOSIS — I129 Hypertensive chronic kidney disease with stage 1 through stage 4 chronic kidney disease, or unspecified chronic kidney disease: Secondary | ICD-10-CM | POA: Diagnosis present

## 2024-11-03 DIAGNOSIS — E785 Hyperlipidemia, unspecified: Secondary | ICD-10-CM | POA: Diagnosis present

## 2024-11-03 DIAGNOSIS — X58XXXA Exposure to other specified factors, initial encounter: Secondary | ICD-10-CM | POA: Diagnosis present

## 2024-11-03 DIAGNOSIS — B9689 Other specified bacterial agents as the cause of diseases classified elsewhere: Secondary | ICD-10-CM | POA: Diagnosis not present

## 2024-11-03 DIAGNOSIS — M86672 Other chronic osteomyelitis, left ankle and foot: Secondary | ICD-10-CM | POA: Diagnosis present

## 2024-11-03 DIAGNOSIS — T8781 Dehiscence of amputation stump: Secondary | ICD-10-CM | POA: Diagnosis present

## 2024-11-03 DIAGNOSIS — E1169 Type 2 diabetes mellitus with other specified complication: Secondary | ICD-10-CM | POA: Diagnosis present

## 2024-11-03 DIAGNOSIS — S83241A Other tear of medial meniscus, current injury, right knee, initial encounter: Secondary | ICD-10-CM | POA: Diagnosis present

## 2024-11-03 DIAGNOSIS — M009 Pyogenic arthritis, unspecified: Secondary | ICD-10-CM

## 2024-11-03 DIAGNOSIS — E11628 Type 2 diabetes mellitus with other skin complications: Secondary | ICD-10-CM | POA: Diagnosis present

## 2024-11-03 DIAGNOSIS — N1831 Chronic kidney disease, stage 3a: Secondary | ICD-10-CM | POA: Diagnosis present

## 2024-11-03 DIAGNOSIS — Z89421 Acquired absence of other right toe(s): Secondary | ICD-10-CM | POA: Diagnosis not present

## 2024-11-03 DIAGNOSIS — L97424 Non-pressure chronic ulcer of left heel and midfoot with necrosis of bone: Secondary | ICD-10-CM | POA: Diagnosis not present

## 2024-11-03 DIAGNOSIS — E11621 Type 2 diabetes mellitus with foot ulcer: Secondary | ICD-10-CM | POA: Diagnosis present

## 2024-11-03 DIAGNOSIS — G8929 Other chronic pain: Secondary | ICD-10-CM | POA: Diagnosis not present

## 2024-11-03 DIAGNOSIS — E1142 Type 2 diabetes mellitus with diabetic polyneuropathy: Secondary | ICD-10-CM | POA: Diagnosis present

## 2024-11-03 DIAGNOSIS — Z720 Tobacco use: Secondary | ICD-10-CM | POA: Diagnosis not present

## 2024-11-03 DIAGNOSIS — E1122 Type 2 diabetes mellitus with diabetic chronic kidney disease: Secondary | ICD-10-CM | POA: Diagnosis present

## 2024-11-03 DIAGNOSIS — Z89422 Acquired absence of other left toe(s): Secondary | ICD-10-CM | POA: Diagnosis not present

## 2024-11-03 DIAGNOSIS — M65961 Unspecified synovitis and tenosynovitis, right lower leg: Secondary | ICD-10-CM | POA: Diagnosis present

## 2024-11-03 DIAGNOSIS — M00861 Arthritis due to other bacteria, right knee: Secondary | ICD-10-CM | POA: Diagnosis not present

## 2024-11-03 DIAGNOSIS — Z794 Long term (current) use of insulin: Secondary | ICD-10-CM | POA: Diagnosis not present

## 2024-11-03 DIAGNOSIS — E66812 Obesity, class 2: Secondary | ICD-10-CM | POA: Diagnosis present

## 2024-11-03 DIAGNOSIS — Z89432 Acquired absence of left foot: Secondary | ICD-10-CM | POA: Diagnosis not present

## 2024-11-03 DIAGNOSIS — Z1624 Resistance to multiple antibiotics: Secondary | ICD-10-CM | POA: Diagnosis present

## 2024-11-03 DIAGNOSIS — R5381 Other malaise: Secondary | ICD-10-CM | POA: Diagnosis present

## 2024-11-03 DIAGNOSIS — E1152 Type 2 diabetes mellitus with diabetic peripheral angiopathy with gangrene: Secondary | ICD-10-CM | POA: Diagnosis present

## 2024-11-03 LAB — BASIC METABOLIC PANEL WITH GFR
Anion gap: 10 (ref 5–15)
BUN: 22 mg/dL — ABNORMAL HIGH (ref 6–20)
CO2: 23 mmol/L (ref 22–32)
Calcium: 8.8 mg/dL — ABNORMAL LOW (ref 8.9–10.3)
Chloride: 99 mmol/L (ref 98–111)
Creatinine, Ser: 1.51 mg/dL — ABNORMAL HIGH (ref 0.61–1.24)
GFR, Estimated: 54 mL/min — ABNORMAL LOW (ref >60)
Glucose, Bld: 84 mg/dL (ref 70–99)
Potassium: 4.8 mmol/L (ref 3.5–5.1)
Sodium: 133 mmol/L — ABNORMAL LOW (ref 135–145)

## 2024-11-03 LAB — CBC
HCT: 27.5 % — ABNORMAL LOW (ref 39.0–52.0)
Hemoglobin: 8.3 g/dL — ABNORMAL LOW (ref 13.0–17.0)
MCH: 24 pg — ABNORMAL LOW (ref 26.0–34.0)
MCHC: 30.2 g/dL (ref 30.0–36.0)
MCV: 79.5 fL — ABNORMAL LOW (ref 80.0–100.0)
Platelets: 426 K/uL — ABNORMAL HIGH (ref 150–400)
RBC: 3.46 MIL/uL — ABNORMAL LOW (ref 4.22–5.81)
RDW: 14.5 % (ref 11.5–15.5)
WBC: 10.9 K/uL — ABNORMAL HIGH (ref 4.0–10.5)
nRBC: 0 % (ref 0.0–0.2)

## 2024-11-03 LAB — GLUCOSE, CAPILLARY: Glucose-Capillary: 107 mg/dL — ABNORMAL HIGH (ref 70–99)

## 2024-11-03 MED ORDER — LIDOCAINE HCL (PF) 1 % IJ SOLN: 5.0000 mL | Freq: Once | INTRAMUSCULAR | Status: DC

## 2024-11-03 MED ORDER — OXYCODONE HCL 5 MG PO TABS
5.0000 mg | ORAL_TABLET | ORAL | Status: DC | PRN
  Filled 2024-11-03: qty 1

## 2024-11-03 MED ORDER — ONDANSETRON HCL 4 MG/2ML IJ SOLN: 4.0000 mg | Freq: Four times a day (QID) | INTRAMUSCULAR | Status: DC | PRN

## 2024-11-03 MED ORDER — INSULIN GLARGINE-YFGN 100 UNIT/ML ~~LOC~~ SOLN: 20.0000 [IU] | Freq: Two times a day (BID) | SUBCUTANEOUS | Status: DC

## 2024-11-03 MED ORDER — INSULIN ASPART 100 UNIT/ML IJ SOLN
0.0000 [IU] | Freq: Three times a day (TID) | INTRAMUSCULAR | Status: DC
  Administered 2024-11-05: 3 [IU] via SUBCUTANEOUS
  Administered 2024-11-05 (×2): 2 [IU] via SUBCUTANEOUS
  Administered 2024-11-07: 11 [IU] via SUBCUTANEOUS
  Administered 2024-11-07: 8 [IU] via SUBCUTANEOUS
  Administered 2024-11-07: 11 [IU] via SUBCUTANEOUS
  Administered 2024-11-08 (×2): 5 [IU] via SUBCUTANEOUS
  Filled 2024-11-03: qty 5
  Filled 2024-11-03: qty 2
  Filled 2024-11-03: qty 8
  Filled 2024-11-03: qty 5
  Filled 2024-11-03: qty 3
  Filled 2024-11-03 (×2): qty 11
  Filled 2024-11-03: qty 2

## 2024-11-03 MED ORDER — SENNOSIDES-DOCUSATE SODIUM 8.6-50 MG PO TABS
1.0000 | ORAL_TABLET | Freq: Every evening | ORAL | Status: DC | PRN
  Administered 2024-11-07 – 2024-11-08 (×3): 1 via ORAL
  Filled 2024-11-03 (×3): qty 1

## 2024-11-03 MED ORDER — ACETAMINOPHEN 650 MG RE SUPP: 650.0000 mg | Freq: Four times a day (QID) | RECTAL | Status: DC | PRN

## 2024-11-03 MED ORDER — INSULIN ASPART 100 UNIT/ML IJ SOLN
0.0000 [IU] | Freq: Every day | INTRAMUSCULAR | Status: DC
  Administered 2024-11-06: 5 [IU] via SUBCUTANEOUS
  Administered 2024-11-07: 3 [IU] via SUBCUTANEOUS
  Filled 2024-11-03: qty 5
  Filled 2024-11-03: qty 3

## 2024-11-03 MED ORDER — ONDANSETRON HCL 4 MG PO TABS: 4.0000 mg | ORAL_TABLET | Freq: Four times a day (QID) | ORAL | Status: DC | PRN

## 2024-11-03 MED ORDER — ACETAMINOPHEN 325 MG PO TABS
650.0000 mg | ORAL_TABLET | Freq: Four times a day (QID) | ORAL | Status: DC | PRN
  Administered 2024-11-04: 650 mg via ORAL
  Filled 2024-11-03: qty 2

## 2024-11-03 MED ORDER — MORPHINE SULFATE (PF) 4 MG/ML IV SOLN
4.0000 mg | INTRAVENOUS | Status: DC | PRN
  Administered 2024-11-04: 4 mg via INTRAVENOUS
  Filled 2024-11-03: qty 1

## 2024-11-03 NOTE — H&P (Addendum)
 History and Physical    Patient: [name] FMW:[id number] DOB: [birth year] DOA: 11/03/2024 DOS: the patient was seen and examined on 11/03/2024 PCP: [name], MD  Patient coming from: Home  Chief Complaint:  Chief Complaint  Patient presents with   Knee Pain   HPI: [name] is a 58 y.o. male with medical history significant of  DM2 c/b polyneuropathy (diabetic ulcerations s/p R TMA), CKD3a, HLD, HTN and cocaine abuse presenting to ED for admission for dehiscence of surgical site s/p L TMA with complex rotational flap (10/17/2024), and resulting acute wound with local cellulitis / necrosis. Plan for debridement and wound vac placement tomorrow per Podiatry.   Patient denies fevers, nausea, changes in sensation or focal weakness. His main complaint is actually right knee pain and swelling that has been present for the past few weeks. He had fluid removed from the knee about 2 weeks ago with orthopedic surgery, fluid sent for analysis and ruled out crystal arthropathy and septic joint.   In the emergency department, he was afebrile, hemodynamically stable.  White blood cell count 10.9. Creatinine 1.51, up from baseline around 1.1 He underwent arthrocentesis in the ED, removed 70cc synovial fluid with some relief.   ED provider discussed the case with Podiatrist Dr. Tanda who recommended admission to the hospitalist service, hold DVT prophylaxis tonight, make n.p.o. at midnight in anticipation of debridement and wound vac placement tomorrow   Admit to hospitalist service  Review of Systems: Review of Systems  Constitutional:  Negative for chills, fever, malaise/fatigue and weight loss.  Respiratory:  Negative for cough and shortness of breath.   Cardiovascular:  Negative for chest pain and leg swelling.  Gastrointestinal:  Negative for abdominal pain, constipation, diarrhea, nausea and vomiting.  Genitourinary:  Negative for hematuria and urgency.  Musculoskeletal:  Positive for  joint pain. Negative for back pain, falls and myalgias.  Neurological:  Positive for tingling (chronic neuropathy). Negative for weakness.    Past Medical History:  Diagnosis Date   Amputation of right great toe    Cellulitis and abscess of foot    CKD stage 3a, GFR 45-59 ml/min (HCC)    Cocaine abuse (HCC)    + UDS on 05-12-24   DM (diabetes mellitus), type 2 (HCC)    ESBL (extended spectrum beta-lactamase) producing bacteria infection 04/18/2024   Hyperlipidemia    Hypertension    Methicillin resistant Staphylococcus aureus infection 07/14/2024   MRSA bacteremia    Normocytic anemia    Obesity    Tobacco abuse    Toe osteomyelitis, left (HCC)    Tuberculosis    tested positive,treated with medications.   Past Surgical History:  Procedure Laterality Date   AMPUTATION TOE Right 03/26/2024   Procedure: AMPUTATION, TOE;  Surgeon: Harden Jerona GAILS, MD;  Location: [facility] OR;  Service: Orthopedics;  Laterality: Right;  RIGHT GREAT TOE AMPUTATION   BONE BIOPSY  04/18/2024   Procedure: BIOPSY, BONE (2ND METATARSAL);  Surgeon: Ashley Soulier, DPM;  Location: [facility] ORS;  Service: Orthopedics/Podiatry;;   FOOT SURGERY Left    I & D EXTREMITY Left 05/05/2022   Procedure: LEFT FOOT DEBRIDEMENT;  Surgeon: Harden Jerona GAILS, MD;  Location: [facility] OR;  Service: Orthopedics;  Laterality: Left;   INCISION AND DRAINAGE OF WOUND Right 06/05/2024   Procedure: DELAYED PRIMARY CLOSURE OF RIGHT FOOT WOUND, EXCISION OF DISTAL METATARSAL 2, 3, 4, and 5;  Surgeon: Ashley Soulier, DPM;  Location: [facility] ORS;  Service: Orthopedics/Podiatry;  Laterality: Right;   IRRIGATION AND  DEBRIDEMENT FOOT Right 04/18/2024   Procedure: IRRIGATION AND DEBRIDEMENT FOOT, PLACEMENT OF ANTIBIOTIC BEADS;  Surgeon: Ashley Soulier, DPM;  Location: [facility] ORS;  Service: Orthopedics/Podiatry;  Laterality: Right;   IRRIGATION AND DEBRIDEMENT FOOT Left 06/29/2024   Procedure: IRRIGATION AND DEBRIDEMENT FOOT;  Surgeon: Lennie Barter, DPM;  Location: [facility]  ORS;  Service: Orthopedics/Podiatry;  Laterality: Left;   MYRINGOTOMY WITH TUBE PLACEMENT Bilateral 07/20/2022   Procedure: MYRINGOTOMY WITH TUBE PLACEMENT;  Surgeon: Edda Mt, MD;  Location: [facility];  Service: ENT;  Laterality: Bilateral;  Diabetic   TEE WITHOUT CARDIOVERSION N/A 07/02/2024   Procedure: ECHOCARDIOGRAM, TRANSESOPHAGEAL;  Surgeon: Perla Evalene PARAS, MD;  Location: [facility] ORS;  Service: Cardiovascular;  Laterality: N/A;   TONSILLECTOMY     TRANSMETATARSAL AMPUTATION Right 04/23/2024   Procedure: AMPUTATION, FOOT, TRANSMETATARSAL;  Surgeon: Ashley Soulier, DPM;  Location: [facility] ORS;  Service: Orthopedics/Podiatry;  Laterality: Right;   TRANSMETATARSAL AMPUTATION Right 06/29/2024   Procedure: AMPUTATION, FOOT, TRANSMETATARSAL;  Surgeon: Lennie Barter, DPM;  Location: [facility] ORS;  Service: Orthopedics/Podiatry;  Laterality: Right;  RIGHT TRANSMETATARSAL AMPUTATION REVISION   TRANSMETATARSAL AMPUTATION Left 10/17/2024   Procedure: AMPUTATION, FOOT, TRANSMETATARSAL;  Surgeon: Tanda Greig MATSU, DPM;  Location: [facility] ORS;  Service: Podiatry;  Laterality: Left;  pulse lavage available   Social History:  reports that he quit smoking about 2 years ago. His smoking use included cigarettes. He started smoking about 27 years ago. He has a 6.3 pack-year smoking history. He has never used smokeless tobacco. He reports that he does not drink alcohol and does not use drugs.  No Known Allergies  Family History  Problem Relation Age of Onset   Colon cancer Neg Hx    Colon polyps Neg Hx    Crohn's disease Neg Hx    Esophageal cancer Neg Hx    Rectal cancer Neg Hx    Stomach cancer Neg Hx    Ulcerative colitis Neg Hx     Prior to Admission medications   Medication Sig Start Date End Date Taking? Authorizing Provider  amoxicillin -clavulanate (AUGMENTIN ) 875-125 MG tablet Take 1 tablet by mouth 2 (two) times daily for 14 days. 10/23/24 11/06/24  Lanetta Lingo, MD  bisacodyl  (DULCOLAX) 5  MG EC tablet Take 2 tablets (10 mg total) by mouth at bedtime as needed for moderate constipation. 04/29/24   Von Bellis, MD  Cholecalciferol  (VITAMIN D3) 1.25 MG (50000 UT) CAPS Take 1 capsule (50,000 Units total) by mouth every Monday. 08/11/24     ciprofloxacin  (CIPRO ) 500 MG tablet Take 1 tablet (500 mg total) by mouth 2 (two) times daily for 14 days. 10/23/24 11/06/24  Lanetta Lingo, MD  Continuous Blood Gluc Transmit (DEXCOM G6 TRANSMITTER) MISC Use to check blood sugar three times daily. Change transmitter once every 909 days. E11.69 06/05/22   Newlin, Enobong, MD  gabapentin  (NEURONTIN ) 300 MG capsule Take 1 capsule (300 mg total) by mouth 2 (two) times daily for neuropathy pain. 08/05/24     glucose blood (ACCU-CHEK GUIDE TEST) test strip Use to check blood sugar 3 times daily. 04/01/24   Newlin, Enobong, MD  insulin  aspart (NOVOLOG ) 100 UNIT/ML FlexPen Inject 0-15 Units into the skin 3 (three) times daily before meals.  Correction coverage: Moderate (average weight, post-op) CBG 70 - 120: 0 units CBG 121 - 150: 2 units CBG 151 - 200: 3 units CBG 201 - 250: 5 units CBG 251 - 300: 8 units CBG 301 - 350: 11 units CBG 351 - 400: 15 units CBG >  400: call MD 04/29/24   Von Bellis, MD  insulin  glargine-yfgn (SEMGLEE ) 100 UNIT/ML injection Inject 0.4 mLs (40 Units total) into the skin daily at 10 pm. 10/23/24   Agbata, Tochukwu, MD  Insulin  Pen Needle (TECHLITE PEN NEEDLES) 32G X 4 MM MISC Use as directed to inject insulin  up to 4 times daily. 08/11/24   Newlin, Enobong, MD  lactobacillus acidophilus & bulgar (LACTINEX) chewable tablet Chew 1 tablet by mouth 3 (three) times daily with meals for 14 days. 10/23/24 11/06/24  Lanetta Lingo, MD  losartan  (COZAAR ) 25 MG tablet Take 0.5 tablets (12.5 mg total) by mouth daily. 09/29/24 09/29/25  Samtani, Jai-Gurmukh, MD  metFORMIN  (GLUCOPHAGE -XR) 500 MG 24 hr tablet Take 2 tablets (1,000 mg total) by mouth 2 (two) times daily with a meal. 08/05/24      naloxone (NARCAN) nasal spray 4 mg/0.1 mL Place 0.4 mg into the nose once. 10/11/24   [provider]  polyethylene glycol (MIRALAX  / GLYCOLAX ) 17 g packet Mix 1 packet (17 g) according to package instructions and take by mouth 2 (two) times daily. 10/23/24   Lanetta Lingo, MD  senna (SENOKOT) 8.6 MG TABS tablet Take 1 tablet (8.6 mg total) by mouth 2 (two) times daily. 10/23/24   Lanetta Lingo, MD    Physical Exam: Vitals:   11/03/24 1615 11/03/24 1616  BP:  109/72  Pulse:  (!) 105  Resp:  20  Temp:  98.1 F (36.7 C)  TempSrc:  Oral  SpO2:  100%  Weight: 102.1 kg   Height: 5' 9 (1.753 m)    Physical Exam Vitals and nursing note reviewed.  Constitutional:      General: He is not in acute distress.    Appearance: He is not ill-appearing, toxic-appearing or diaphoretic.  HENT:     Head: Normocephalic and atraumatic.     Mouth/Throat:     Mouth: Mucous membranes are moist.  Eyes:     General: No scleral icterus.    Extraocular Movements: Extraocular movements intact.  Cardiovascular:     Rate and Rhythm: Normal rate and regular rhythm.  Pulmonary:     Effort: Pulmonary effort is normal. No respiratory distress.     Breath sounds: Normal breath sounds. No wheezing.  Abdominal:     General: Bowel sounds are normal. There is no distension.     Palpations: Abdomen is soft.     Tenderness: There is no abdominal tenderness. There is no guarding.  Musculoskeletal:     Cervical back: Neck supple.     Right lower leg: Edema present.     Left lower leg: Edema present.     Comments: R knee limited AROM, warm to touch, minimal effusion. Band aid over medial aspect where arthrocentesis was performed  R foot in camboot Socks not removed for foot examination; pt had just had it dressed at Podiatry office prior to arrival   Skin:    Capillary Refill: Capillary refill takes less than 2 seconds.  Neurological:     Mental Status: He is alert and oriented to person, place,  and time. Mental status is at baseline.  Psychiatric:        Mood and Affect: Mood normal.        Behavior: Behavior normal.     Data Reviewed:   Labs on Admission: I have personally reviewed following labs and imaging studies  CBC: Recent Labs  Lab 11/03/24 1621  WBC 10.9*  HGB 8.3*  HCT 27.5*  MCV 79.5*  PLT 426*   Basic Metabolic Panel: Recent Labs  Lab 11/03/24 1621  NA 133*  K 4.8  CL 99  CO2 23  GLUCOSE 84  BUN 22*  CREATININE 1.51*  CALCIUM  8.8*   GFR: Estimated Creatinine Clearance: 63.6 mL/min (A) (by C-G formula based on SCr of 1.51 mg/dL (H)). Liver Function Tests: No results for input(s): AST, ALT, ALKPHOS, BILITOT, PROT, ALBUMIN in the last 168 hours. No results for input(s): LIPASE, AMYLASE in the last 168 hours. No results for input(s): AMMONIA in the last 168 hours. Coagulation Profile: No results for input(s): INR, PROTIME in the last 168 hours. Cardiac Enzymes: No results for input(s): CKTOTAL, CKMB, CKMBINDEX, TROPONINI in the last 168 hours. BNP (last 3 results) No results for input(s): PROBNP in the last 8760 hours. HbA1C: No results for input(s): HGBA1C in the last 72 hours. CBG: No results for input(s): GLUCAP in the last 168 hours. Lipid Profile: No results for input(s): CHOL, HDL, LDLCALC, TRIG, CHOLHDL, LDLDIRECT in the last 72 hours. Thyroid  Function Tests: No results for input(s): TSH, T4TOTAL, FREET4, T3FREE, THYROIDAB in the last 72 hours. Anemia Panel: No results for input(s): VITAMINB12, FOLATE, FERRITIN, TIBC, IRON , RETICCTPCT in the last 72 hours. Urine analysis:    Component Value Date/Time   COLORURINE AMBER (A) 09/26/2024 2230   APPEARANCEUR CLOUDY (A) 09/26/2024 2230   LABSPEC 1.017 09/26/2024 2230   PHURINE 5.0 09/26/2024 2230   GLUCOSEU 50 (A) 09/26/2024 2230   HGBUR SMALL (A) 09/26/2024 2230   BILIRUBINUR NEGATIVE 09/26/2024 2230   KETONESUR  NEGATIVE 09/26/2024 2230   PROTEINUR 100 (A) 09/26/2024 2230   UROBILINOGEN 0.2 11/10/2010 1558   NITRITE NEGATIVE 09/26/2024 2230   LEUKOCYTESUR NEGATIVE 09/26/2024 2230    Radiological Exams on Admission: DG Foot 2 Views Left Result Date: 11/03/2024 EXAM: 2 VIEW(S) XRAY OF THE LEFT FOOT 11/03/2024 05:45:00 PM COMPARISON: 10/23/2024 CLINICAL HISTORY: infection FINDINGS: BONES AND JOINTS: Suspect developing lucency within the distal most 2nd metatarsal. Periosteal reaction about the 5th metatarsal shaft is unchanged. No acute fracture. No joint dislocation. SOFT TISSUES: Soft tissue defect distal to the remaining 2nd metatarsal. Soft tissue gas and overlying bandaged material. IMPRESSION: 1. Soft tissue ulcer distal to the 2nd metatarsal with suggestion of osteomyelitis within the distal most 2nd metatarsal shaft. Consider further evaluation with pre and postcontrast MRI. Electronically signed by: Rockey Kilts MD 11/03/2024 06:33 PM EST RP Workstation: [id number]       Assessment and Plan:  left foot OM s/p L TMA  with complex rotational flap 10/17/24 Wound dehiscence with acute infection/surrounding cellulitis at surgical site  -Management per Podiatry.  plan for OR debridement and wound vac placement tomorrow  - NPO midnight  - pain control - optimize glucose control   IDDM,T2 Diabetic Neuropathy  - resume lantus  22 u BID tomorrow after surgery - SSI  - resume gabapentin    R Knee effusion  - s/p 70 cc synovial fluid removal in the ED, suspect recurrent effusion in setting of OA - Septic joint and crystal arthropathy ruled out previously - Follow-up orthopedic surgery  HTN HLD - resume home medications when reconciled   Initiate DVT ppx after OR tomorrow  NPO midnight  No IVF Monitor/replace electrolytes   Advance Care Planning:   Code Status: Prior Discussed with patient at time of admission   Consults: Podiatry      Severity of Illness: The appropriate patient  status for this patient is INPATIENT. Inpatient status is judged to be reasonable  and necessary in order to provide the required intensity of service to ensure the patient's safety. The patient's presenting symptoms, physical exam findings, and initial radiographic and laboratory data in the context of their chronic comorbidities is felt to place them at high risk for further clinical deterioration. Furthermore, it is not anticipated that the patient will be medically stable for discharge from the hospital within 2 midnights of admission.   * I certify that at the point of admission it is my clinical judgment that the patient will require inpatient hospital care spanning beyond 2 midnights from the point of admission due to high intensity of service, high risk for further deterioration and high frequency of surveillance required.*  Author: Daved JAYSON Pump, DO 11/03/2024 6:07 PM  For on call review [email or web address].

## 2024-11-03 NOTE — ED Notes (Signed)
 See triage note. Pt states that he is primarily in for his left knee pain and swelling. His surgeon who performed the amputation approx 2 weeks ago will be cleaning and applying a wound vacuum to his left foot at an appointment tomorrow morning. Pt A&Ox4, NAD at time of assessment.

## 2024-11-03 NOTE — ED Provider Notes (Signed)
 [facility] Provider Note    Event Date/Time   First MD Initiated Contact with Patient 11/03/24 1640     (approximate)  History   Chief Complaint: Knee Pain  HPI  Jimmey Hengel is a 58 y.o. male with a past medical history of CKD, diabetes, hypertension, hyperlipidemia, left foot infection presents to the emergency department for worsening wound to the left foot.  According to the patient he was sent here for admission for a surgery tomorrow.  However patient is also complaining of significant right knee pain.  He has a significant effusion on exam he follows up with orthopedics and states they were supposed to drain fluid from the knee states he last had it drained 2 weeks ago and it provides him some relief.  Patient denies any fever.  Physical Exam   Triage Vital Signs: ED Triage Vitals  Encounter Vitals Group     BP 11/03/24 1616 109/72     Girls Systolic BP Percentile --      Girls Diastolic BP Percentile --      Boys Systolic BP Percentile --      Boys Diastolic BP Percentile --      Pulse Rate 11/03/24 1616 (!) 105     Resp 11/03/24 1616 20     Temp 11/03/24 1616 98.1 F (36.7 C)     Temp Source 11/03/24 1616 Oral     SpO2 11/03/24 1616 100 %     Weight 11/03/24 1615 225 lb (102.1 kg)     Height 11/03/24 1615 5' 9 (1.753 m)     Head Circumference --      Peak Flow --      Pain Score 11/03/24 1614 10     Pain Loc --      Pain Education --      Exclude from Growth Chart --     Most recent vital signs: Vitals:   11/03/24 1616  BP: 109/72  Pulse: (!) 105  Resp: 20  Temp: 98.1 F (36.7 C)  SpO2: 100%    General: Awake, no distress.  CV:  Good peripheral perfusion.  Regular rate and rhythm  Resp:  Normal effort.  Equal breath sounds bilaterally.  Abd:  No distention. Other:  New dressing and splint on left lower extremity.  Significant right knee effusion palpated on exam.  Good range of motion although with some pain in the right knee.   No erythema.   ED Results / Procedures / Treatments   RADIOLOGY  I have reviewed and interpreted the x-ray images.  Patient has had a distal foot amputation.  I do not appreciate any obvious infection of the bone or osteomyelitis awaiting radiology read.   MEDICATIONS ORDERED IN ED: Medications  lidocaine  (PF) (XYLOCAINE ) 1 % injection 5 mL (has no administration in time range)     IMPRESSION / MDM / ASSESSMENT AND PLAN / ED COURSE  I reviewed the triage vital signs and the nursing notes.  Patient's presentation is most consistent with acute presentation with potential threat to life or bodily function.  Patient presents emergency department for admission to the hospital for left foot wound.  I spoke to Dr. Tanda of podiatry she is aware of the patient and plans to take him to the OR tomorrow.  States she has just placed a new dressing on the wound.  Patient's workup today shows reassuring CBC with a white blood cell count of 10.9, chronic anemia.  Patient's chemistry shows  slightly worse creatinine than normal but no significant change.  I spoke with Dr. Tanda we will admit to the hospital service for OR optimization plan to go to the operating room tomorrow for debridement followed by wound VAC per podiatry.  They would like the patient's DVT prophylaxis held and n.p.o. at midnight.  Does not need antibiotics currently, just preop antibiotics per Dr. Tanda.  We will obtain x-rays and admit to the hospital service.  I spoke to the patient regarding the knee he strongly wishes for the knee to be tapped to remove fluid.  Spoke with the patient regarding risk and benefit he is agreeable to proceed with arthrocentesis for fluid removal for patient comfort which I will perform.  Spoke to the hospitalist will be admitting to their service.  ARTHOCENTESIS Performed by: Franky Moores Consent: Verbal consent obtained. Risks and benefits: risks, benefits and alternatives were  discussed Consent given by: patient Required items: required blood products, implants, devices, and special equipment available Patient identity confirmed: verbally with patient Time out: Immediately prior to procedure a time out was called to verify the correct patient, procedure, equipment, support staff and site/side marked as required. Indications: Right knee effusion Joint: Right knee Local anesthesia used: 1% Xylocaine  Preparation: Patient was prepped and draped in the usual sterile fashion. Aspirate appearance: Yellow Aspirate amount: 70 ml Patient tolerance: Patient tolerated the procedure well with no immediate complications.  Did not send synovial fluid to the lab as the patient has had multiple synovial fluid analysis appears to show more arthritic type effusion.  FINAL CLINICAL IMPRESSION(S) / ED DIAGNOSES   Right knee effusion Left foot wound   Note:  This document was prepared using Dragon voice recognition software and may include unintentional dictation errors.   Moores Franky, MD 11/03/24 1753

## 2024-11-03 NOTE — ED Notes (Signed)
 First Nurse Note: Pt to ED via [facility] for wound on his left foot. Pt had toes on his left foot amputated, staff from clinic unsure when. Wound is not healing and has a foul odor. Pt did not have fever in the office.

## 2024-11-03 NOTE — Consult Note (Signed)
 PODIATRIC SURGERY: CONSULT NOTE   Reason for Consult: L foot wounds   HPI: [name] is a 58 y.o. male with PMH significant DM2 c/b polyneuropathy (diabetic ulcerations s/p R TMA), CKD3a, HLD, HTN and cocaine abuse presenting to ED for admission for dehiscence of surgical site s/p L TMA with complex rotational flap (DOS: 10/17/2024), and resulting acute wound with local cellulitis / necrosis.  While in hospital during last admission immediately postoperatively he was found to have issues with compliance of nonweightbearing status on the left lower extremity endorsing immediate weightbearing.  The flap at time of discharge was noted to have a good perfusion refill of flap closure sites with sutures intact.  He was then discharged to ARU to assist with compliance and nonweightbearing status and dressing changes 10/23/2024.  Approximately 3 days ago he noted increased drainage and malodor from the dressing site.  Patient denies any water exposure/wetness to the area and reports that the dressings have stayed clean dry and intact with exception of dressing changes to assist with maceration. Patient does endorse that since being at ARU he has had to stand up to transfer himself and to go to the restroom.  Patient was added on to clinic schedule to be seen acutely in outpatient setting today 11/03/2024, and was noted to have dehiscence of surgical site with necrosis of a portion of the rotational flap.  Wound was noted to go down to bone and have active serous drainage along with mild erythema immediately adjacent to the surgical site incisions. + Malodor noted from the wound.  I advised the patient that given the depth of the wound and the amount of necrotic tissue that is located at the surgical wound base, he will likely need admission with extensive debridement and wound VAC placement urgently.  When patient appeared stable in clinic setting and had already eaten today I advised that he go to the emergency  room for admission with plans to conduct I&D with wound VAC placement tomorrow as an add-on case.  Patient was amenable to this recommendation.    *Lastly of note: Patient mentioned that his right knee has been limiting his motion and function, and that he has an appointment upcoming with clinic provider Ortho to have the knee drained and potential steroid administration.  He is anxious and nervous that he will miss this appointment again and that this is his primary source of any pain, in addition that it has led to him placing more weight on the left lower extremity given he cannot fully bear weight on his right knee.  Denies: F/C/N/V  SOB/CP/calf pain.  Last PO: 11/03/2024 - Fluid in afternoon - food recently.    PMHx:  Past Medical History:  Diagnosis Date   Amputation of right great toe    Cellulitis and abscess of foot    CKD stage 3a, GFR 45-59 ml/min (HCC)    Cocaine abuse (HCC)    + UDS on 05-12-24   DM (diabetes mellitus), type 2 (HCC)    ESBL (extended spectrum beta-lactamase) producing bacteria infection 04/18/2024   Hyperlipidemia    Hypertension    Methicillin resistant Staphylococcus aureus infection 07/14/2024   MRSA bacteremia    Normocytic anemia    Obesity    Tobacco abuse    Toe osteomyelitis, left (HCC)    Tuberculosis    tested positive,treated with medications.    Surgical Hx:  Past Surgical History:  Procedure Laterality Date   AMPUTATION TOE Right 03/26/2024   Procedure:  AMPUTATION, TOE;  Surgeon: Harden Jerona GAILS, MD;  Location: [facility] OR;  Service: Orthopedics;  Laterality: Right;  RIGHT GREAT TOE AMPUTATION   BONE BIOPSY  04/18/2024   Procedure: BIOPSY, BONE (2ND METATARSAL);  Surgeon: Ashley Soulier, DPM;  Location: [facility] ORS;  Service: Orthopedics/Podiatry;;   FOOT SURGERY Left    I & D EXTREMITY Left 05/05/2022   Procedure: LEFT FOOT DEBRIDEMENT;  Surgeon: Harden Jerona GAILS, MD;  Location: [facility] OR;  Service: Orthopedics;  Laterality: Left;   INCISION AND DRAINAGE  OF WOUND Right 06/05/2024   Procedure: DELAYED PRIMARY CLOSURE OF RIGHT FOOT WOUND, EXCISION OF DISTAL METATARSAL 2, 3, 4, and 5;  Surgeon: Ashley Soulier, DPM;  Location: [facility] ORS;  Service: Orthopedics/Podiatry;  Laterality: Right;   IRRIGATION AND DEBRIDEMENT FOOT Right 04/18/2024   Procedure: IRRIGATION AND DEBRIDEMENT FOOT, PLACEMENT OF ANTIBIOTIC BEADS;  Surgeon: Ashley Soulier, DPM;  Location: [facility] ORS;  Service: Orthopedics/Podiatry;  Laterality: Right;   IRRIGATION AND DEBRIDEMENT FOOT Left 06/29/2024   Procedure: IRRIGATION AND DEBRIDEMENT FOOT;  Surgeon: Lennie Barter, DPM;  Location: [facility] ORS;  Service: Orthopedics/Podiatry;  Laterality: Left;   MYRINGOTOMY WITH TUBE PLACEMENT Bilateral 07/20/2022   Procedure: MYRINGOTOMY WITH TUBE PLACEMENT;  Surgeon: Edda Mt, MD;  Location: [facility];  Service: ENT;  Laterality: Bilateral;  Diabetic   TEE WITHOUT CARDIOVERSION N/A 07/02/2024   Procedure: ECHOCARDIOGRAM, TRANSESOPHAGEAL;  Surgeon: Perla Evalene PARAS, MD;  Location: [facility] ORS;  Service: Cardiovascular;  Laterality: N/A;   TONSILLECTOMY     TRANSMETATARSAL AMPUTATION Right 04/23/2024   Procedure: AMPUTATION, FOOT, TRANSMETATARSAL;  Surgeon: Ashley Soulier, DPM;  Location: [facility] ORS;  Service: Orthopedics/Podiatry;  Laterality: Right;   TRANSMETATARSAL AMPUTATION Right 06/29/2024   Procedure: AMPUTATION, FOOT, TRANSMETATARSAL;  Surgeon: Lennie Barter, DPM;  Location: [facility] ORS;  Service: Orthopedics/Podiatry;  Laterality: Right;  RIGHT TRANSMETATARSAL AMPUTATION REVISION   TRANSMETATARSAL AMPUTATION Left 10/17/2024   Procedure: AMPUTATION, FOOT, TRANSMETATARSAL;  Surgeon: Tanda Greig MATSU, DPM;  Location: [facility] ORS;  Service: Podiatry;  Laterality: Left;  pulse lavage available    FHx:  Family History  Problem Relation Age of Onset   Colon cancer Neg Hx    Colon polyps Neg Hx    Crohn's disease Neg Hx    Esophageal cancer Neg Hx    Rectal cancer Neg Hx    Stomach cancer Neg Hx     Ulcerative colitis Neg Hx     Social History:  reports that he quit smoking about 2 years ago. His smoking use included cigarettes. He started smoking about 27 years ago. He has a 6.3 pack-year smoking history. He has never used smokeless tobacco. He reports that he does not drink alcohol and does not use drugs.  Allergies: No Known Allergies  Facility-Administered Medications Prior to Admission  Medication Dose Route Frequency Provider Last Rate Last Admin   diclofenac  Sodium (VOLTAREN ) 1 % topical gel 2 g  2 g Topical BID        Medications Prior to Admission  Medication Sig Dispense Refill   amoxicillin -clavulanate (AUGMENTIN ) 875-125 MG tablet Take 1 tablet by mouth 2 (two) times daily for 14 days. 28 tablet 0   bisacodyl  (DULCOLAX) 5 MG EC tablet Take 2 tablets (10 mg total) by mouth at bedtime as needed for moderate constipation. 30 tablet 0   Cholecalciferol  (VITAMIN D3) 1.25 MG (50000 UT) CAPS Take 1 capsule (50,000 Units total) by mouth every Monday. 4 capsule 0   ciprofloxacin  (CIPRO ) 500 MG tablet Take 1 tablet (500  mg total) by mouth 2 (two) times daily for 14 days. 28 tablet 0   Continuous Blood Gluc Transmit (DEXCOM G6 TRANSMITTER) MISC Use to check blood sugar three times daily. Change transmitter once every 909 days. E11.69 1 each 1   gabapentin  (NEURONTIN ) 300 MG capsule Take 1 capsule (300 mg total) by mouth 2 (two) times daily for neuropathy pain. 60 capsule 0   glucose blood (ACCU-CHEK GUIDE TEST) test strip Use to check blood sugar 3 times daily. 100 each 6   insulin  aspart (NOVOLOG ) 100 UNIT/ML FlexPen Inject 0-15 Units into the skin 3 (three) times daily before meals.  Correction coverage: Moderate (average weight, post-op) CBG 70 - 120: 0 units CBG 121 - 150: 2 units CBG 151 - 200: 3 units CBG 201 - 250: 5 units CBG 251 - 300: 8 units CBG 301 - 350: 11 units CBG 351 - 400: 15 units CBG > 400: call MD 15 mL 11   insulin  glargine-yfgn (SEMGLEE ) 100 UNIT/ML  injection Inject 0.4 mLs (40 Units total) into the skin daily at 10 pm. 10 mL 11   Insulin  Pen Needle (TECHLITE PEN NEEDLES) 32G X 4 MM MISC Use as directed to inject insulin  up to 4 times daily. 100 each 0   lactobacillus acidophilus & bulgar (LACTINEX) chewable tablet Chew 1 tablet by mouth 3 (three) times daily with meals for 14 days. 42 tablet 0   losartan  (COZAAR ) 25 MG tablet Take 0.5 tablets (12.5 mg total) by mouth daily.     metFORMIN  (GLUCOPHAGE -XR) 500 MG 24 hr tablet Take 2 tablets (1,000 mg total) by mouth 2 (two) times daily with a meal. 120 tablet 0   naloxone (NARCAN) nasal spray 4 mg/0.1 mL Place 0.4 mg into the nose once.     polyethylene glycol (MIRALAX  / GLYCOLAX ) 17 g packet Mix 1 packet (17 g) according to package instructions and take by mouth 2 (two) times daily. 14 each 0   senna (SENOKOT) 8.6 MG TABS tablet Take 1 tablet (8.6 mg total) by mouth 2 (two) times daily. 120 tablet 0    Physical Exam: Blood pressure 104/78, pulse (!) 101, temperature 98.7 F (37.1 C), temperature source Oral, resp. rate 19, height 5' 9 (1.753 m), weight 102.1 kg, SpO2 100%.  Constitutional: Patient appears of normal development and good nutritional status for stated age, well-groomed, and of normal body habitus. Phonating appropriately.   Psychiatric: Alert and oriented to time, place, person and situation. The patient is noted to have good judgment and insight into the hospital visit  Constitutional: Patient appears of normal development and good nutritional status for stated age, well-groomed, and of normal body habitus. Phonating appropriately.    Psychiatric: Alert and oriented to time, place, person and situation. The patient is noted to have good judgment and insight into the hospital visit   FOCUSED LOWER EXTREMITY EXAMINATION:    Neurological:  - Protective sensation diminished / absent - Gross protective sensation diminished bilaterally.  - No focal motor or sensory deficits  identified bilaterally.    Vascular: LEFT - Dorsalis Pedis artery on palpation: 2 - Posterior Tibial artery on palpation: 1 - Capillary Filling Times: sluggish - Peripheral edema: localized to distal L forefoot   Musculoskeletal:  S/p R TMA  S/p L TMA with rotational closure.  Muscle strength: 5/5 in all 4 quadrants  Ankle Joint ROM: decreased, equinus noted Global foot - TTP: none noted.   Dermatological:  R TMA site appears well coapted, no openings  #  WOUND 1   Type: Full thickness neuropathic ulceration / surgical dehisence  Location: L central-medial TMA distal stump site.  --- Wound base: 20% Granular ; 30% Fibrosis ; 50% Necrotic  Adjacent tissue/ Wound borders: rolled PTB: YES Malodor: YES Active Drainage: YES; If yes, consistency: SEROUS       Results for orders placed or performed during the hospital encounter of 11/03/24 (from the past 48 hours)  Basic metabolic panel     Status: Abnormal   Collection Time: 11/03/24  4:21 PM  Result Value Ref Range   Sodium 133 (L) 135 - 145 mmol/L   Potassium 4.8 3.5 - 5.1 mmol/L   Chloride 99 98 - 111 mmol/L   CO2 23 22 - 32 mmol/L   Glucose, Bld 84 70 - 99 mg/dL    Comment: Glucose reference range applies only to samples taken after fasting for at least 8 hours.   BUN 22 (H) 6 - 20 mg/dL   Creatinine, Ser 8.48 (H) 0.61 - 1.24 mg/dL   Calcium  8.8 (L) 8.9 - 10.3 mg/dL   GFR, Estimated 54 (L) >60 mL/min    Comment: (NOTE) Calculated using the CKD-EPI Creatinine Equation (2021)    Anion gap 10 5 - 15    Comment: Performed at [facility], [address] [place], [place] [address]  CBC     Status: Abnormal   Collection Time: 11/03/24  4:21 PM  Result Value Ref Range   WBC 10.9 (H) 4.0 - 10.5 K/uL   RBC 3.46 (L) 4.22 - 5.81 MIL/uL   Hemoglobin 8.3 (L) 13.0 - 17.0 g/dL   HCT 72.4 (L) 60.9 - 47.9 %   MCV 79.5 (L) 80.0 - 100.0 fL   MCH 24.0 (L) 26.0 - 34.0 pg   MCHC 30.2 30.0 - 36.0 g/dL   RDW 85.4 88.4 -  84.4 %   Platelets 426 (H) 150 - 400 K/uL   nRBC 0.0 0.0 - 0.2 %    Comment: Performed at [facility], [address] [place], [place] [address]   DG Foot 2 Views Left Result Date: 11/03/2024 EXAM: 2 VIEW(S) XRAY OF THE LEFT FOOT 11/03/2024 05:45:00 PM COMPARISON: 10/23/2024 CLINICAL HISTORY: infection FINDINGS: BONES AND JOINTS: Suspect developing lucency within the distal most 2nd metatarsal. Periosteal reaction about the 5th metatarsal shaft is unchanged. No acute fracture. No joint dislocation. SOFT TISSUES: Soft tissue defect distal to the remaining 2nd metatarsal. Soft tissue gas and overlying bandaged material. IMPRESSION: 1. Soft tissue ulcer distal to the 2nd metatarsal with suggestion of osteomyelitis within the distal most 2nd metatarsal shaft. Consider further evaluation with pre and postcontrast MRI. Electronically signed by: Rockey Kilts MD 11/03/2024 06:33 PM EST RP Workstation: [id number]    Assessment  Diabetic foot infection, left foot Dehiscence of surgical site with extensive necrotic tissue, left foot Chronic osteomyelitis, left foot Type 2 diabetes c/b polyneuropathy and chronic kidney disease  Patient seen today in clinic 11/03/2024, where he was found to have surgical dehiscence and necrosis of the rotational flap of the left TMA site that was conducted on 10/17/2024.  He was admitted with plans to undergo I&D with wound VAC placement (if wound base is amenable) and plans to discharge back to ARU with wound VAC change instructions in place.  We discussed that necrosis of the flap leaves the surgical site at great exposure for acute on chronic infection, and that patient remains at extremely high risk for more proximal amputation/limb loss.  Wound VAC  placement permits healing of deficit while retaining the functional aspect of current TMA site via secondary intent healing.  Provider had very lengthy conversation with patient that there are no guarantees with the  outcome of ongoing advanced wound care and repeat surgical intervention, and no guarantees were made.  We discussed the necessity for strict nonweightbearing adherence to give this more distal amputation site and current wound the best chance at survival.  All patient questions were answered to provider's best ability and patient satisfaction.  Patient verbalized his understanding of this conversation.  - Case request placed for 11/04/2024 - Consent request placed for 11/04/2024 *Please hold DVT prophylaxis   Plan  - ABX: Appreciate Medicine / ID / Pharmacy assist with antibiotic stewardship and appropriate coverage.  -Follow-up clinic culture - Diet: NPO at midnight.  - Activity: NWB LEFT  lower extremity.   - Wound Care: Dressing instructions: Podiatry to change.  Patient placed in new dressings on 11/03/2024.  Dressings okay to be changed for evaluation as needed by other medical providers.  To be replaced with Betadine  soaked gauze overlying gauze ABD pad Kerlix and loosely placed Ace.    Greig [name] 11/03/2024, 8:19 PM

## 2024-11-03 NOTE — ED Triage Notes (Signed)
 Pt to triage via wheelchair.  Pt has pain and swelling to right knee.  No known injury to right knee.  Pt had fluid drwn off right knee 2 weeks ago.    Pt had left foot toes amputated last week and due to have more surgery tomorrow per pt.    Pt alert  speech clear.

## 2024-11-04 ENCOUNTER — Encounter
Admission: EM | Disposition: A | Discharge status: Skilled Nursing Facility | Payer: Self-pay | Source: Home / Self Care | Attending: Emergency Medicine

## 2024-11-04 ENCOUNTER — Encounter (HOSPITAL_BASED_OUTPATIENT_CLINIC_OR_DEPARTMENT_OTHER): Admitting: Internal Medicine

## 2024-11-04 ENCOUNTER — Inpatient Hospital Stay: Admitting: Certified Registered"

## 2024-11-04 ENCOUNTER — Inpatient Hospital Stay

## 2024-11-04 DIAGNOSIS — L089 Local infection of the skin and subcutaneous tissue, unspecified: Secondary | ICD-10-CM | POA: Diagnosis not present

## 2024-11-04 DIAGNOSIS — T148XXA Other injury of unspecified body region, initial encounter: Secondary | ICD-10-CM | POA: Diagnosis not present

## 2024-11-04 HISTORY — PX: APPLICATION OF WOUND VAC: SHX5189

## 2024-11-04 HISTORY — PX: INCISION AND DRAINAGE OF WOUND: SHX1803

## 2024-11-04 LAB — GLUCOSE, CAPILLARY
Glucose-Capillary: 101 mg/dL — ABNORMAL HIGH (ref 70–99)
Glucose-Capillary: 95 mg/dL (ref 70–99)
Glucose-Capillary: 104 mg/dL — ABNORMAL HIGH (ref 70–99)
Glucose-Capillary: 97 mg/dL (ref 70–99)
Glucose-Capillary: 85 mg/dL (ref 70–99)
Glucose-Capillary: 160 mg/dL — ABNORMAL HIGH (ref 70–99)

## 2024-11-04 LAB — MRSA NEXT GEN BY PCR, NASAL: MRSA by PCR Next Gen: NOT DETECTED

## 2024-11-04 SURGERY — IRRIGATION AND DEBRIDEMENT WOUND
Anesthesia: General | Site: Foot | Laterality: Left

## 2024-11-04 MED ORDER — PROPOFOL 500 MG/50ML IV EMUL
INTRAVENOUS | Status: DC | PRN
  Administered 2024-11-04: 50 ug/kg/min via INTRAVENOUS

## 2024-11-04 MED ORDER — PROPOFOL 10 MG/ML IV BOLUS
INTRAVENOUS | Status: AC
  Filled 2024-11-04: qty 20

## 2024-11-04 MED ORDER — OXYCODONE HCL 5 MG PO TABS
ORAL_TABLET | ORAL | Status: AC
  Filled 2024-11-04: qty 1

## 2024-11-04 MED ORDER — 0.9 % SODIUM CHLORIDE (POUR BTL) OPTIME
TOPICAL | Status: DC | PRN
  Administered 2024-11-04: 500 mL

## 2024-11-04 MED ORDER — LIDOCAINE HCL (PF) 1 % IJ SOLN
INTRAMUSCULAR | Status: AC
  Filled 2024-11-04: qty 30

## 2024-11-04 MED ORDER — FENTANYL CITRATE (PF) 100 MCG/2ML IJ SOLN: 25.0000 ug | INTRAMUSCULAR | Status: DC | PRN

## 2024-11-04 MED ORDER — SODIUM CHLORIDE 0.9 % IV SOLN: INTRAVENOUS | Status: DC | PRN

## 2024-11-04 MED ORDER — MIDAZOLAM HCL 2 MG/2ML IJ SOLN
INTRAMUSCULAR | Status: AC
  Filled 2024-11-04: qty 2

## 2024-11-04 MED ORDER — VANCOMYCIN HCL IN DEXTROSE 1-5 GM/200ML-% IV SOLN
1000.0000 mg | Freq: Once | INTRAVENOUS | Status: AC
  Administered 2024-11-04: 1000 mg via INTRAVENOUS
  Filled 2024-11-04: qty 200

## 2024-11-04 MED ORDER — VANCOMYCIN HCL 1500 MG/300ML IV SOLN
1500.0000 mg | Freq: Once | INTRAVENOUS | Status: AC
  Administered 2024-11-04: 1500 mg via INTRAVENOUS
  Filled 2024-11-04: qty 300

## 2024-11-04 MED ORDER — SODIUM CHLORIDE 0.9 % IV SOLN
2.0000 g | Freq: Three times a day (TID) | INTRAVENOUS | Status: DC
  Administered 2024-11-04 – 2024-11-07 (×7): 2 g via INTRAVENOUS
  Filled 2024-11-04 (×10): qty 12.5

## 2024-11-04 MED ORDER — VANCOMYCIN HCL 1000 MG IV SOLR
INTRAVENOUS | Status: AC
  Filled 2024-11-04: qty 20

## 2024-11-04 MED ORDER — FENTANYL CITRATE (PF) 100 MCG/2ML IJ SOLN
INTRAMUSCULAR | Status: AC
  Filled 2024-11-04: qty 2

## 2024-11-04 MED ORDER — SODIUM CHLORIDE 0.9 % IV SOLN: 12.5000 mg | Freq: Four times a day (QID) | INTRAVENOUS | Status: DC | PRN

## 2024-11-04 MED ORDER — LIDOCAINE HCL (PF) 2 % IJ SOLN
INTRAMUSCULAR | Status: AC
  Filled 2024-11-04: qty 5

## 2024-11-04 MED ORDER — ONDANSETRON HCL 4 MG/2ML IJ SOLN
4.0000 mg | Freq: Four times a day (QID) | INTRAMUSCULAR | Status: DC | PRN
  Administered 2024-11-04 (×2): 4 mg via INTRAVENOUS
  Filled 2024-11-04 (×3): qty 2

## 2024-11-04 MED ORDER — OXYCODONE HCL 5 MG/5ML PO SOLN: 5.0000 mg | Freq: Once | ORAL | Status: AC | PRN

## 2024-11-04 MED ORDER — OXYCODONE-ACETAMINOPHEN 5-325 MG PO TABS
1.0000 | ORAL_TABLET | ORAL | Status: DC | PRN
  Administered 2024-11-04 – 2024-11-08 (×15): 2 via ORAL
  Filled 2024-11-04 (×11): qty 2
  Filled 2024-11-04: qty 1
  Filled 2024-11-04 (×5): qty 2
  Filled 2024-11-04: qty 1

## 2024-11-04 MED ORDER — ONDANSETRON HCL 4 MG PO TABS: 4.0000 mg | ORAL_TABLET | Freq: Four times a day (QID) | ORAL | Status: DC | PRN

## 2024-11-04 MED ORDER — MORPHINE SULFATE (PF) 2 MG/ML IV SOLN
2.0000 mg | INTRAVENOUS | Status: DC | PRN
  Administered 2024-11-04 (×3): 2 mg via INTRAVENOUS
  Filled 2024-11-04 (×3): qty 1

## 2024-11-04 MED ORDER — PROPOFOL 10 MG/ML IV BOLUS
INTRAVENOUS | Status: DC | PRN
  Administered 2024-11-04: 50 mg via INTRAVENOUS

## 2024-11-04 MED ORDER — NICOTINE 21 MG/24HR TD PT24: 21.0000 mg | MEDICATED_PATCH | Freq: Every day | TRANSDERMAL | Status: DC | PRN

## 2024-11-04 MED ORDER — VANCOMYCIN VARIABLE DOSE PER UNSTABLE RENAL FUNCTION (PHARMACIST DOSING): Status: DC

## 2024-11-04 MED ORDER — OXYCODONE HCL 5 MG PO TABS
5.0000 mg | ORAL_TABLET | Freq: Once | ORAL | Status: AC
  Administered 2024-11-04: 5 mg via ORAL

## 2024-11-04 MED ORDER — FENTANYL CITRATE (PF) 100 MCG/2ML IJ SOLN
INTRAMUSCULAR | Status: DC | PRN
  Administered 2024-11-04: 50 ug via INTRAVENOUS

## 2024-11-04 MED ORDER — FENTANYL CITRATE (PF) 50 MCG/ML IJ SOSY: 25.0000 ug | PREFILLED_SYRINGE | INTRAMUSCULAR | Status: DC | PRN

## 2024-11-04 MED ORDER — MIDAZOLAM HCL (PF) 2 MG/2ML IJ SOLN
INTRAMUSCULAR | Status: DC | PRN
  Administered 2024-11-04: 2 mg via INTRAVENOUS

## 2024-11-04 MED ORDER — OXYCODONE HCL 5 MG PO TABS
5.0000 mg | ORAL_TABLET | Freq: Once | ORAL | Status: AC | PRN
  Administered 2024-11-04: 5 mg via ORAL

## 2024-11-04 MED ORDER — BUPIVACAINE HCL (PF) 0.5 % IJ SOLN
INTRAMUSCULAR | Status: AC
  Filled 2024-11-04: qty 30

## 2024-11-04 MED ORDER — LIDOCAINE HCL (PF) 2 % IJ SOLN
INTRAMUSCULAR | Status: DC | PRN
  Administered 2024-11-04: 100 mg via INTRADERMAL

## 2024-11-04 SURGICAL SUPPLY — 56 items
BANDAGE GAUZE 1X75IN STRL (MISCELLANEOUS) ×1 IMPLANT
BLADE OSC/SAGITTAL MD 5.5X18 (BLADE) IMPLANT
BLADE SURG 15 STRL LF DISP TIS (BLADE) ×1 IMPLANT
BLADE SW THK.38XMED LNG THN (BLADE) IMPLANT
BNDG COHESIVE 4X5 TAN STRL LF (GAUZE/BANDAGES/DRESSINGS) ×1 IMPLANT
BNDG COHESIVE 6X5 TAN ST LF (GAUZE/BANDAGES/DRESSINGS) ×1 IMPLANT
BNDG ELASTIC 4INX 5YD STR LF (GAUZE/BANDAGES/DRESSINGS) ×1 IMPLANT
BNDG ELASTIC 4X5.8 VLCR NS LF (GAUZE/BANDAGES/DRESSINGS) ×1 IMPLANT
BNDG ESMARCH 4X12 STRL LF (GAUZE/BANDAGES/DRESSINGS) ×1 IMPLANT
BNDG GAUZE DERMACEA FLUFF 4 (GAUZE/BANDAGES/DRESSINGS) ×1 IMPLANT
BNDG STRETCH GAUZE 3IN X12FT (GAUZE/BANDAGES/DRESSINGS) ×1 IMPLANT
CANISTER WOUND CARE 500ML ATS (WOUND CARE) ×1 IMPLANT
CUFF TOURN SGL QUICK 12 (TOURNIQUET CUFF) IMPLANT
CUFF TOURN SGL QUICK 18X4 (TOURNIQUET CUFF) IMPLANT
DRAPE FLUOR MINI C-ARM 54X84 (DRAPES) IMPLANT
DRAPE XRAY CASSETTE 23X24 (DRAPES) IMPLANT
DRSG EMULSION OIL 3X3 NADH (GAUZE/BANDAGES/DRESSINGS) IMPLANT
DRSG MEPILEX FLEX 3X3 (GAUZE/BANDAGES/DRESSINGS) IMPLANT
DURAPREP 26ML APPLICATOR (WOUND CARE) ×1 IMPLANT
ELECTRODE REM PT RTRN 9FT ADLT (ELECTROSURGICAL) ×1 IMPLANT
GAUZE PACKING 0.25INX5YD STRL (GAUZE/BANDAGES/DRESSINGS) ×1 IMPLANT
GAUZE SPONGE 4X4 12PLY STRL (GAUZE/BANDAGES/DRESSINGS) ×1 IMPLANT
GAUZE STRETCH 2X75IN STRL (MISCELLANEOUS) ×1 IMPLANT
GAUZE XEROFORM 1X8 LF (GAUZE/BANDAGES/DRESSINGS) ×1 IMPLANT
GLOVE BIOGEL PI IND STRL 6.5 (GLOVE) ×1 IMPLANT
GLOVE SURG SYN 6.5 PF PI (GLOVE) ×1 IMPLANT
GOWN STRL REUS W/ TWL LRG LVL3 (GOWN DISPOSABLE) ×1 IMPLANT
GOWN STRL REUS W/TWL MED LVL3 (GOWN DISPOSABLE) ×1 IMPLANT
HANDPIECE VERSAJET DEBRIDEMENT (MISCELLANEOUS) IMPLANT
KIT DRSG VAC SLVR GRANUFM (MISCELLANEOUS) ×1 IMPLANT
KIT TURNOVER KIT A (KITS) ×1 IMPLANT
LABEL OR SOLS (LABEL) ×1 IMPLANT
MANIFOLD NEPTUNE II (INSTRUMENTS) ×1 IMPLANT
NDL FILTER BLUNT 18X1 1/2 (NEEDLE) ×1 IMPLANT
NDL HYPO 25X1 1.5 SAFETY (NEEDLE) ×1 IMPLANT
NEEDLE FILTER BLUNT 18X1 1/2 (NEEDLE) ×1 IMPLANT
NEEDLE HYPO 25X1 1.5 SAFETY (NEEDLE) ×1 IMPLANT
NS IRRIG 500ML POUR BTL (IV SOLUTION) ×1 IMPLANT
PACK EXTREMITY ARMC (MISCELLANEOUS) ×1 IMPLANT
PACKING GAUZE IODOFORM 1INX5YD (GAUZE/BANDAGES/DRESSINGS) IMPLANT
PAD ABD DERMACEA PRESS 5X9 (GAUZE/BANDAGES/DRESSINGS) IMPLANT
PENCIL SMOKE EVACUATOR (MISCELLANEOUS) ×1 IMPLANT
RASP SM TEAR CROSS CUT (RASP) IMPLANT
SHIELD FULL FACE ANTIFOG 7M (MISCELLANEOUS) ×1 IMPLANT
SOL .9 NS 3000ML IRR UROMATIC (IV SOLUTION) ×1 IMPLANT
SOLN STERILE WATER 500 ML (IV SOLUTION) ×1 IMPLANT
SOLUTION PREP PVP 2OZ (MISCELLANEOUS) IMPLANT
STOCKINETTE STRL 3IN 960336 (SOFTGOODS) IMPLANT
STOCKINETTE STRL 6IN 960660 (GAUZE/BANDAGES/DRESSINGS) IMPLANT
SUT VIC AB 3-0 SH 27X BRD (SUTURE) ×1 IMPLANT
SUTURE EHLN 3-0 FS-10 30 BLK (SUTURE) IMPLANT
SWAB CULTURE AMIES ANAERIB BLU (MISCELLANEOUS) IMPLANT
SYR 10ML LL (SYRINGE) ×1 IMPLANT
SYR 3ML LL SCALE MARK (SYRINGE) ×1 IMPLANT
TIP FAN IRRIG PULSAVAC PLUS (DISPOSABLE) ×1 IMPLANT
TRAP FLUID SMOKE EVACUATOR (MISCELLANEOUS) ×1 IMPLANT

## 2024-11-04 NOTE — Transfer of Care (Signed)
 Immediate Anesthesia Transfer of Care Note  Patient: [name]  Procedure(s) Performed: IRRIGATION AND DEBRIDEMENT WOUND (Left: Foot) APPLICATION, WOUND VAC (Left: Foot)  Patient Location: PACU  Anesthesia Type:General  Level of Consciousness: awake, alert , and oriented  Airway & Oxygen  Therapy: Patient Spontanous Breathing and Patient connected to face mask oxygen   Post-op Assessment: Report given to RN and Post -op Vital signs reviewed and stable  Post vital signs: Reviewed and stable  Last Vitals:  Vitals Value Taken Time  BP 132/79   Temp    Pulse 83   Resp 19   SpO2 100     Last Pain:  Vitals:   11/04/24 1840  TempSrc:   PainSc: 6          Complications: No notable events documented.

## 2024-11-04 NOTE — Progress Notes (Addendum)
 Pharmacy Antibiotic Note  [name] is a 58 y.o. male admitted on 11/03/2024 with osteomyelitis.  Pharmacy has been consulted for vancomycin  and cefepime  dosing.  Plan: Give vancomycin  2500 mg IV x1. Check random vanc level tomorrow given AKI (SCr 1.51, baseline 1-1.1) Start cefepime  2 g IV Q8H Continue to monitor renal function and follow culture results   Height: 5' 9 (175.3 cm) Weight: 113.1 kg (249 lb 5.4 oz) IBW/kg (Calculated) : 70.7  Temp (24hrs), Avg:99.2 F (37.3 C), Min:98.1 F (36.7 C), Max:102 F (38.9 C)  Recent Labs  Lab 11/03/24 1621  WBC 10.9*  CREATININE 1.51*    Estimated Creatinine Clearance: 67 mL/min (A) (by C-G formula based on SCr of 1.51 mg/dL (H)).    No Known Allergies  Antimicrobials this admission: 12/2 Vanc >>  12/2 Cefepime  >>   Microbiology results: 12/2 Wound cx: Moderate GPC, rare GNR, rare GPRs 12/2 MRSA PCR: Negative  Thank you for allowing pharmacy to be a part of this patient's care.  Lum VEAR Mania, PharmD, BCPS 11/04/2024 2:06 PM

## 2024-11-04 NOTE — Assessment & Plan Note (Signed)
 -  This complicates overall care and prognosis.

## 2024-11-04 NOTE — Assessment & Plan Note (Signed)
 As needed nicotine 

## 2024-11-04 NOTE — Assessment & Plan Note (Signed)
 MRI right knee w wo contrast ordered

## 2024-11-04 NOTE — Progress Notes (Addendum)
 PROGRESS NOTE  [name]  FMW:[id number] DOB: Jan 27, 1966 DOA: 11/03/2024 PCP: [name], MD   Mr. [name] is a 58 year old male with history of insulin -dependent diabetes mellitus type 2, with diabetic foot ulcer, CKD 3A, hyperlipidemia, hypertension, cocaine abuse, who presents emergency department for chief concerns of chronic left foot wounds and right knee pain.  Patient reports her right knee pain has been around for many many weeks, over a month ago.  He reports he has had the right knee drained many times however has not been shown any infection.  He reports the pain got worse last night.  11/03/2024: Presented to the ED and admitted to [facility] for left foot infection concerning for osteomyelitis with history of left TMA with complex rotational flap on 10/17/2024.  Wound dehiscence present.  Patient will get TMA today and debridement with podiatry.  12/2: Incision and drainage with podiatry of the left foot today.  Cefepime , vancomycin  initiated.  MRI of the right knee ordered.  Assessment & Plan:   Principal Problem:   Wound infection Active Problems:   Diabetic foot ulcer (HCC)   Tobacco abuse   Obesity (BMI 30-39.9)   Right knee pain    Assessment and Plan:  Diabetic foot ulcer (HCC) Patient is to get TMA debridement today with podiatry Vancomycin  and cefepime  per pharmacy initiate  Tobacco abuse As needed nicotine   Obesity (BMI 30-39.9) This complicates overall care and prognosis.   Right knee pain MRI right knee w wo contrast ordered  DVT prophylaxis: Pharmacologic DVT not initiated.  AM team to initiate pharmacologic DVT when the benefits outweigh the risk. Code Status: Full code Family Communication: No Disposition Plan: Pending clinical course Level of care: Med-Surg  Consultants:  Podiatry  Procedures:  Incision and drainage today with podiatry  Antimicrobials: Cefepime  and vancomycin   Subjective:  At bedside, patient  was able to tell me his first last name, age, occasion, current calendar year.  Reports his right knee is most bothersome to him.  He reports is been bothering him for over a month.  He reports he has not had any resolution or definitive diagnosis regarding his pain.  Objective: Vitals:   11/04/24 0642 11/04/24 0744 11/04/24 1144 11/04/24 1529  BP: 105/72 110/65 122/75 130/74  Pulse: 98 96 92 92  Resp: 18 19 19 18   Temp: 99.8 F (37.7 C) 98.7 F (37.1 C) 99.2 F (37.3 C) 99.2 F (37.3 C)  TempSrc:      SpO2: 99% 97% 98% 100%  Weight:      Height:        Intake/Output Summary (Last 24 hours) at 11/04/2024 1636 Last data filed at 11/04/2024 0941 Gross per 24 hour  Intake --  Output 1400 ml  Net -1400 ml   Filed Weights   11/03/24 1615 11/03/24 2026  Weight: 102.1 kg 113.1 kg   Examination:  General exam: Appears calm and comfortable  Respiratory system: Clear to auscultation. Respiratory effort normal. Cardiovascular system: S1 & S2 heard, RRR. No JVD, murmurs, rubs, gallops or clicks. No pedal edema. Gastrointestinal system: Abdomen is nondistended, soft and nontender. No organomegaly or masses felt. Normal bowel sounds heard. Central nervous system: Alert and oriented. No focal neurological deficits. Extremities:  Right knee swelling.  Decree strength of the right lower extremity.  Left foot wound with dressing in place. Skin: No rashes, lesions or ulcers Psychiatry: Judgement and insight appear normal. Mood & affect appropriate.   Data Reviewed: I have personally reviewed following  labs and imaging studies  CBC: Recent Labs  Lab 11/03/24 1621  WBC 10.9*  HGB 8.3*  HCT 27.5*  MCV 79.5*  PLT 426*   Basic Metabolic Panel: Recent Labs  Lab 11/03/24 1621  NA 133*  K 4.8  CL 99  CO2 23  GLUCOSE 84  BUN 22*  CREATININE 1.51*  CALCIUM  8.8*   GFR: Estimated Creatinine Clearance: 67 mL/min (A) (by C-G formula based on SCr of 1.51 mg/dL (H)).  CBG: Recent  Labs  Lab 11/03/24 2028 11/04/24 0745 11/04/24 1145 11/04/24 1626  GLUCAP 107* 101* 95 104*   Recent Results (from the past 240 hours)  Aerobic/Anaerobic Culture w Gram Stain (surgical/deep wound)     Status: None (Preliminary result)   Collection Time: 11/03/24  7:56 PM   Specimen: Wound  Result Value Ref Range Status   Specimen Description   Final    WOUND Performed at [facility], [address] [place], [place] [address]    Special Requests   Final    NONE Performed at [facility], [address] [place], [place] [address]    Gram Stain   Final    NO WBC SEEN MODERATE GRAM POSITIVE COCCI RARE GRAM NEGATIVE RODS RARE GRAM POSITIVE RODS Performed at [facility], [address] [place], [place] [address]    Culture PENDING  Incomplete   Report Status PENDING  Incomplete  MRSA Next Gen by PCR, Nasal     Status: None   Collection Time: 11/04/24  1:21 AM   Specimen: Nasal Mucosa; Nasal Swab  Result Value Ref Range Status   MRSA by PCR Next Gen NOT DETECTED NOT DETECTED Final    Comment: (NOTE) The GeneXpert MRSA Assay (FDA approved for NASAL specimens only), is one component of a comprehensive MRSA colonization surveillance program. It is not intended to diagnose MRSA infection nor to guide or monitor treatment for MRSA infections. Test performance is not FDA approved in patients less than 79 years old. Performed at [facility], [address] [place], [place] [address]      Radiology Studies: DG Foot 2 Views Left Result Date: 11/03/2024 EXAM: 2 VIEW(S) XRAY OF THE LEFT FOOT 11/03/2024 05:45:00 PM COMPARISON: 10/23/2024 CLINICAL HISTORY: infection FINDINGS: BONES AND JOINTS: Suspect developing lucency within the distal most 2nd metatarsal. Periosteal reaction about the 5th metatarsal shaft is unchanged. No acute fracture. No joint dislocation. SOFT TISSUES: Soft tissue defect distal to the remaining 2nd metatarsal. Soft tissue  gas and overlying bandaged material. IMPRESSION: 1. Soft tissue ulcer distal to the 2nd metatarsal with suggestion of osteomyelitis within the distal most 2nd metatarsal shaft. Consider further evaluation with pre and postcontrast MRI. Electronically signed by: Rockey Kilts MD 11/03/2024 06:33 PM EST RP Workstation: [id number]   Scheduled Meds:  insulin  aspart  0-15 Units Subcutaneous TID WC   insulin  aspart  0-5 Units Subcutaneous QHS   lidocaine  (PF)  5 mL Infiltration Once   vancomycin  variable dose per unstable renal function (pharmacist dosing)   Does not apply See admin instructions   Continuous Infusions:  ceFEPime  (MAXIPIME ) IV 2 g (11/04/24 1508)   promethazine (PHENERGAN) injection (IM or IVPB)     vancomycin  1,000 mg (11/04/24 1558)   And   vancomycin  1,500 mg (11/04/24 1635)    LOS: 1 day   Time spent: 50 minutes  Dr. Sherre [facility] If 7PM-7AM, please contact night-coverage 11/04/2024, 4:36 PM

## 2024-11-04 NOTE — Assessment & Plan Note (Addendum)
 Patient is to get TMA debridement today with podiatry Vancomycin  and cefepime  per pharmacy initiate

## 2024-11-04 NOTE — Brief Op Note (Signed)
 11/03/2024 - 11/04/2024  8:10 PM  PATIENT:  [name]  58 y.o. 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  SURGEON:  Surgeons and Role:    DEWAINE Blush, Greig MATSU, DPM - Primary  PHYSICIAN ASSISTANT: n/a  ASSISTANTS: none   ANESTHESIA:   IV sedation  EBL:  Minimal   BLOOD ADMINISTERED:none  DRAINS: Wound vac with suction on/ running seal good check at  LOCAL MEDICATIONS USED:  NONE  SPECIMEN:  No Specimen  DISPOSITION OF SPECIMEN:  N/A  COUNTS:  YES  TOURNIQUET:  * Missing tourniquet times found for documented tourniquets in log: [id number] *  DICTATION: .Note written in EPIC  PLAN OF CARE: Admit to inpatient   PATIENT DISPOSITION:  PACU - hemodynamically stable.   Delay start of Pharmacological VTE agent (>24hrs) due to surgical blood loss or risk of bleeding: no

## 2024-11-04 NOTE — Plan of Care (Signed)
   Problem: Skin Integrity: Goal: Risk for impaired skin integrity will decrease Outcome: Progressing

## 2024-11-04 NOTE — Interval H&P Note (Signed)
 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  The various methods of treatment have been discussed with the patient and family. After consideration of risks, benefits and other options for treatment, the patient has consented to  Procedure(s) with comments: IRRIGATION AND DEBRIDEMENT WOUND (Left) APPLICATION, WOUND VAC (Left) - wound vac available intraop please. as a surgical intervention.  The patient's history has been reviewed, patient examined, no change in status, stable for surgery.  I have reviewed the patient's chart and labs.  Questions were answered to the patient's satisfaction.     Jeena Arnett [name]

## 2024-11-04 NOTE — Plan of Care (Signed)
   Problem: Education: Goal: Ability to describe self-care measures that may prevent or decrease complications (Diabetes Survival Skills Education) will improve Outcome: Progressing Goal: Individualized Educational Video(s) Outcome: Progressing   Problem: Coping: Goal: Ability to adjust to condition or change in health will improve Outcome: Progressing

## 2024-11-04 NOTE — Anesthesia Preprocedure Evaluation (Signed)
 Anesthesia Evaluation  Patient identified by MRN, date of birth, ID band Patient awake    Reviewed: Allergy & Precautions, NPO status , Patient's Chart, lab work & pertinent test results  History of Anesthesia Complications Negative for: history of anesthetic complications  Airway Mallampati: III  TM Distance: <3 FB Neck ROM: full    Dental  (+) Chipped, Poor Dentition   Pulmonary neg shortness of breath, former smoker   Pulmonary exam normal        Cardiovascular hypertension, (-) angina Normal cardiovascular exam     Neuro/Psych negative neurological ROS  negative psych ROS   GI/Hepatic negative GI ROS, Neg liver ROS,neg GERD  ,,  Endo/Other  diabetes, Type 2    Renal/GU CRFRenal disease     Musculoskeletal   Abdominal   Peds  Hematology  (+) Blood dyscrasia, anemia   Anesthesia Other Findings Past Medical History: No date: Amputation of right great toe (HCC) No date: Cellulitis and abscess of foot No date: CKD stage 3a, GFR 45-59 ml/min (HCC) No date: Cocaine abuse (HCC)     Comment:  + UDS on 05-12-24 No date: DM (diabetes mellitus), type 2 (HCC) 04/18/2024: ESBL (extended spectrum beta-lactamase) producing  bacteria infection No date: Hyperlipidemia No date: Hypertension No date: MRSA bacteremia No date: Normocytic anemia No date: Obesity No date: Tobacco abuse No date: Toe osteomyelitis, left (HCC) No date: Tuberculosis     Comment:  tested positive,treated with medications.  Past Surgical History: 03/26/2024: AMPUTATION TOE; Right     Comment:  Procedure: AMPUTATION, TOE;  Surgeon: Harden Jerona GAILS,               MD;  Location: [facility] OR;  Service: Orthopedics;  Laterality:              Right;  RIGHT GREAT TOE AMPUTATION 04/18/2024: BONE BIOPSY     Comment:  Procedure: BIOPSY, BONE (2ND METATARSAL);  Surgeon:               Ashley Soulier, DPM;  Location: [facility] ORS;  Service:                Orthopedics/Podiatry;; No date: FOOT SURGERY; Left 05/05/2022: I & D EXTREMITY; Left     Comment:  Procedure: LEFT FOOT DEBRIDEMENT;  Surgeon: Harden Jerona GAILS, MD;  Location: [facility] OR;  Service: Orthopedics;                Laterality: Left; 06/05/2024: INCISION AND DRAINAGE OF WOUND; Right     Comment:  Procedure: DELAYED PRIMARY CLOSURE OF RIGHT FOOT WOUND,               EXCISION OF DISTAL METATARSAL 2, 3, 4, and 5;  Surgeon:               Ashley Soulier, DPM;  Location: [facility] ORS;  Service:               Orthopedics/Podiatry;  Laterality: Right; 04/18/2024: IRRIGATION AND DEBRIDEMENT FOOT; Right     Comment:  Procedure: IRRIGATION AND DEBRIDEMENT FOOT, PLACEMENT OF              ANTIBIOTIC BEADS;  Surgeon: Ashley Soulier, DPM;                Location: [facility] ORS;  Service: Orthopedics/Podiatry;                Laterality: Right; 06/29/2024: IRRIGATION  AND DEBRIDEMENT FOOT; Left     Comment:  Procedure: IRRIGATION AND DEBRIDEMENT FOOT;  Surgeon:               Lennie Barter, DPM;  Location: [facility] ORS;  Service:               Orthopedics/Podiatry;  Laterality: Left; 07/20/2022: MYRINGOTOMY WITH TUBE PLACEMENT; Bilateral     Comment:  Procedure: MYRINGOTOMY WITH TUBE PLACEMENT;  Surgeon:               Juengel, Paul, MD;  Location: [facility];                Service: ENT;  Laterality: Bilateral;  Diabetic No date: TONSILLECTOMY 04/23/2024: TRANSMETATARSAL AMPUTATION; Right     Comment:  Procedure: AMPUTATION, FOOT, TRANSMETATARSAL;  Surgeon:               Ashley Soulier, DPM;  Location: [facility] ORS;  Service:               Orthopedics/Podiatry;  Laterality: Right; 06/29/2024: TRANSMETATARSAL AMPUTATION; Right     Comment:  Procedure: AMPUTATION, FOOT, TRANSMETATARSAL;  Surgeon:               Lennie Barter, DPM;  Location: [facility] OR[facility];  Service:               Orthopedics/Podiatry;  Laterality: Right;  RIGHT               TRANSMETATARSAL AMPUTATION REVISION  BMI    Body Mass Index:  32.28 kg/m      Reproductive/Obstetrics negative OB ROS                              Anesthesia Physical Anesthesia Plan  ASA: 3  Anesthesia Plan: General   Post-op Pain Management:    Induction: Intravenous  PONV Risk Score and Plan: 2 and Ondansetron , Dexamethasone , TIVA, Propofol  infusion and Midazolam   Airway Management Planned: Natural Airway and Nasal Cannula  Additional Equipment:   Intra-op Plan:   Post-operative Plan:   Informed Consent: I have reviewed the patients History and Physical, chart, labs and discussed the procedure including the risks, benefits and alternatives for the proposed anesthesia with the patient or authorized representative who has indicated his/her understanding and acceptance.     Dental Advisory Given  Plan Discussed with: Anesthesiologist, CRNA and Surgeon  Anesthesia Plan Comments: (Patient consented for risks of anesthesia including but not limited to:  - adverse reactions to medications - risk of airway placement if required - damage to eyes, teeth, lips or other oral mucosa - nerve damage due to positioning  - sore throat or hoarseness - Damage to heart, brain, nerves, lungs, other parts of body or loss of life  Patient voiced understanding and assent.)        Anesthesia Quick Evaluation

## 2024-11-05 ENCOUNTER — Inpatient Hospital Stay

## 2024-11-05 DIAGNOSIS — T148XXA Other injury of unspecified body region, initial encounter: Secondary | ICD-10-CM | POA: Diagnosis not present

## 2024-11-05 DIAGNOSIS — L089 Local infection of the skin and subcutaneous tissue, unspecified: Secondary | ICD-10-CM | POA: Diagnosis not present

## 2024-11-05 LAB — SYNOVIAL CELL COUNT + DIFF, W/ CRYSTALS
Crystals, Fluid: NONE SEEN
Eosinophils-Synovial: 0 %
Lymphocytes-Synovial Fld: 1 %
Monocyte-Macrophage-Synovial Fluid: 1 %
Neutrophil, Synovial: 98 %
WBC, Synovial: 83333 /mm3 — ABNORMAL HIGH (ref 0–200)

## 2024-11-05 LAB — CBC
HCT: 24.4 % — ABNORMAL LOW (ref 39.0–52.0)
Hemoglobin: 7.5 g/dL — ABNORMAL LOW (ref 13.0–17.0)
MCH: 24.2 pg — ABNORMAL LOW (ref 26.0–34.0)
MCHC: 30.7 g/dL (ref 30.0–36.0)
MCV: 78.7 fL — ABNORMAL LOW (ref 80.0–100.0)
Platelets: 352 K/uL (ref 150–400)
RBC: 3.1 MIL/uL — ABNORMAL LOW (ref 4.22–5.81)
RDW: 14.3 % (ref 11.5–15.5)
WBC: 7.4 K/uL (ref 4.0–10.5)
nRBC: 0 % (ref 0.0–0.2)

## 2024-11-05 LAB — BASIC METABOLIC PANEL WITH GFR
Anion gap: 8 (ref 5–15)
BUN: 21 mg/dL — ABNORMAL HIGH (ref 6–20)
CO2: 25 mmol/L (ref 22–32)
Calcium: 8.4 mg/dL — ABNORMAL LOW (ref 8.9–10.3)
Chloride: 101 mmol/L (ref 98–111)
Creatinine, Ser: 1.35 mg/dL — ABNORMAL HIGH (ref 0.61–1.24)
GFR, Estimated: >60 mL/min (ref >60)
Glucose, Bld: 149 mg/dL — ABNORMAL HIGH (ref 70–99)
Potassium: 4.6 mmol/L (ref 3.5–5.1)
Sodium: 135 mmol/L (ref 135–145)

## 2024-11-05 LAB — GLUCOSE, CAPILLARY
Glucose-Capillary: 137 mg/dL — ABNORMAL HIGH (ref 70–99)
Glucose-Capillary: 150 mg/dL — ABNORMAL HIGH (ref 70–99)
Glucose-Capillary: 162 mg/dL — ABNORMAL HIGH (ref 70–99)
Glucose-Capillary: 114 mg/dL — ABNORMAL HIGH (ref 70–99)

## 2024-11-05 MED ORDER — DIPHENHYDRAMINE HCL 25 MG PO CAPS
25.0000 mg | ORAL_CAPSULE | Freq: Four times a day (QID) | ORAL | Status: DC | PRN
  Administered 2024-11-05: 25 mg via ORAL
  Filled 2024-11-05: qty 1

## 2024-11-05 MED ORDER — GADOBUTROL 1 MMOL/ML IV SOLN
10.0000 mL | Freq: Once | INTRAVENOUS | Status: AC | PRN
  Administered 2024-11-05: 10 mL via INTRAVENOUS

## 2024-11-05 MED ORDER — ENOXAPARIN SODIUM 30 MG/0.3ML IJ SOSY: 30.0000 mg | PREFILLED_SYRINGE | INTRAMUSCULAR | Status: DC

## 2024-11-05 MED ORDER — VANCOMYCIN HCL 1500 MG/300ML IV SOLN
1500.0000 mg | INTRAVENOUS | Status: DC
  Administered 2024-11-05: 1500 mg via INTRAVENOUS
  Filled 2024-11-05 (×2): qty 300

## 2024-11-05 MED ORDER — DICLOFENAC SODIUM 1 % EX GEL
4.0000 g | Freq: Four times a day (QID) | CUTANEOUS | Status: DC
  Administered 2024-11-05 – 2024-11-07 (×7): 4 g via TOPICAL
  Filled 2024-11-05: qty 100

## 2024-11-05 NOTE — Anesthesia Postprocedure Evaluation (Signed)
 Anesthesia Post Note  Patient: [name]  Procedure(s) Performed: IRRIGATION AND DEBRIDEMENT WOUND (Left: Foot) APPLICATION, WOUND VAC (Left: Foot)  Patient location during evaluation: PACU Anesthesia Type: General Level of consciousness: awake and alert Pain management: pain level controlled Vital Signs Assessment: post-procedure vital signs reviewed and stable Respiratory status: spontaneous breathing, nonlabored ventilation, respiratory function stable and patient connected to nasal cannula oxygen  Cardiovascular status: blood pressure returned to baseline and stable Postop Assessment: no apparent nausea or vomiting Anesthetic complications: no   No notable events documented.   Last Vitals:  Vitals:   11/05/24 0014 11/05/24 0148  BP: 135/77 112/75  Pulse: 88 86  Resp: 18 18  Temp: 37 C 36.7 C  SpO2: 98% 99%    Last Pain:  Vitals:   11/04/24 2256  TempSrc:   PainSc: 10-Worst pain ever                 [facility]

## 2024-11-05 NOTE — Progress Notes (Signed)
 Pharmacy Antibiotic Note  [name] is a 58 y.o. male admitted on 11/03/2024 with osteomyelitis.  Pharmacy has been consulted for vancomycin  and cefepime  dosing. Scr is still slightly elevated from baseline but is improving.   Plan: Given Scr is down-trending and close to baseline, will go ahead and schedule maintenance dose  Start vancomycin  1500 mg IV Q24H. Goal AUC 400-550. Expected AUC:486.6 Expected Css min: 10.7 SCr used: 1.35 Weight used: IBW, Vd used: 0.5 (BMI 36.82) Continue cefepime  2 g IV Q8H Continue to monitor renal function and follow culture results   Height: 5' 9 (175.3 cm) Weight: 113.1 kg (249 lb 5.4 oz) IBW/kg (Calculated) : 70.7  Temp (24hrs), Avg:98.7 F (37.1 C), Min:98.1 F (36.7 C), Max:99.2 F (37.3 C)  Recent Labs  Lab 11/03/24 1621 11/05/24 0546  WBC 10.9* 7.4  CREATININE 1.51* 1.35*    Estimated Creatinine Clearance: 74.9 mL/min (A) (by C-G formula based on SCr of 1.35 mg/dL (H)).    No Known Allergies  Antimicrobials this admission: 12/2 Vanc >>  12/2 Cefepime  >>   Microbiology results: 12/2 Wound cx: Moderate GPC, rare GNR, rare GPRs 12/2 MRSA PCR: Negative  Thank you for allowing pharmacy to be a part of this patient's care.  Lum VEAR Mania, PharmD, BCPS 11/05/2024 10:20 AM

## 2024-11-05 NOTE — Op Note (Addendum)
 SABRAFOOT AND ANKLE SURGERY  OPERATIVE REPORT   DOS: 11/04/2024 SURGEON: Yesenia Locurto G. 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  Distal medial 50% of rotational flap with complete necrosis, well adhered to dorsal incision. 2.  S/p excisional debridement efforts, healthy granular wound bed with minimal exposed metatarsal noted to the distal medial aspect of TMA site, lateral TMA site incision intact. 3. DME wound vac applied to the 10cm x 8cm x 4cm wound bed, set at continuous suction with good seal.    INDICATIONS FOR PROCEDURE:  [name] is a 58 y.o. male with PMH significant DM2 c/b polyneuropathy (diabetic ulcerations s/p R TMA), CKD3a, HLD, HTN and cocaine abuse (last used one month ago per patient) presenting for dehiscence of surgical site s/p L TMA with complex rotational flap (DOS: 10/17/2024).  Patient endorses his noncompliance with weightbearing restrictions, stating that he did ambulate back and forth on a daily basis within the hospital immediately postoperatively and at acute rehab.  Dehiscence of surgical site resulted in acute wound with local cellulitis and flap necrosis.  Given recent TMA amputation and defect of wound with foul necrotic tissue noted in wound bed we discussed surgical debridement of incision and drainage / irrigation and debridement of all nonviable soft tissue and bone of the left foot with wound VAC placement.  The procedure, alternatives, risks, and limitations in this individual case have been carefully discussed with the patient. All questions have been thoroughly answered and the  patient understands the surgery indicated. The patient has requested that this surgical intervention be undertaken and a consent form was signed.   PROCEDURAL DETAILS:  The patient was brought to the operating room and was transitioned to the operating room table in the supine position. A standard awake time-out was conducted to confirm the correct patient, procedure, side, and site. All team members were in agreement.  Anesthesia team initiated general anesthesia and prophylactic antibiosis coverage from inpatient stay on floor was confirmed.  An ankle tourniquet was then applied and set to as a measure of hemostasis, but was ultimately not inflated. The extremity was then scrubbed, prepped and draped in the usual aseptic manner. After elevating and exsanguinating the operative extremity, the tourniquet was inflated to 250 mmHg.  Attention was then directed to the distal medial aspect of the left transmetatarsal amputation site where necrotic rotational flap was observed approximately 50% of the prior flap completely necrosed.  The wound bed underlying the necrotic flap was noted to be fibrotic and malodorous.  There was no purulent drainage noted.  Wound pre-debridement was approximately 4cm x 5cm x 2cm.   Excisional debridement was performed via sterile #15 blade as well as sterile rongeur to remove nonviable necrotic structures apparent - this included epidermis, dermis, subcutaneous tissue, fascia, muscle, and up to, but not including bone. All viable neurovascular structures were identified, protected, and retracted throughout the entire procedure.  There is no purulent drainage noted on dissection deepening.  No extensive loculations or tracking was noted post excisional debridement removal of necrotic flap including epidermis, dermis, subcutaneous tissue, fascia, muscle, up to, but not including bone. The surgical site was then irrigated with  copious amounts normal sterile saline. The  surgical site was then evaluated and debrided of all nonviable appearing tissue as noted above.The remaining bone and adjacent soft tissue was absent of necrosis and observed to be of good viable quality.  Wound deficit was then curetted to bleeding subcutaneous tissue, muscle, fascia.  The surgical site was again irrigated with copious amounts normal sterile saline and the foot was allowed to perfuse to adequately evaluate and address for any arterial or extensive bleeding. Once satisfied with the hemostatic control, perfusion to the foot, and clean wound bed measuring approximately 10cm x 8cm x 4cm, a wound VAC application ensued.  Adaptic mesh was placed over deep wound defect structures including bone and tendinous structures - followed by wound VAC black sponge.  Adaptic was also placed over the remaining lateral transmetatarsal incision and plantar lateral foot incision.  These remaining incisions were then incorporated into the main wound VAC sponge via black sponge bridging.  The wound VAC sponge was then connected to overlying Lilly pad and tubing placed to dorsal foot orientation.  Wound VAC was tested intraoperatively and found to have good seal, set to 125 mmHg continuous running.  The wound VAC was then secured further with ABD pad padding to the dorsal aspect of the foot to reduce any irritation of the tubing to existing limb structure.   Overall, the patient tolerated the procedure and anesthesia well. Patient was transferred to the recovery room with vital signs stable and appropriate vascular status noted to the left foot. Following a period of post-operative monitoring, the patient will be transferred to inpatient floor to resume medical management with the following written and oral post-operative instructions below.   POST-OPERATIVE PLAN: Patient was instructed to be strict nonweightbearing on the left lower extremity. Order for wound care nursing/wound VAC changes to be conducted Monday  Wednesday Friday (or at least 3 times a week).

## 2024-11-05 NOTE — Consult Note (Signed)
 ORTHOPAEDIC CONSULTATION  REQUESTING PHYSICIAN: Pokhrel, Vernal, MD  Chief Complaint:   Right knee pain  History of Present Illness: Gagan Dillion is a 58 y.o. male with past medical history of type 2 diabetes, diabetic foot ulcer, CKD, HLD, HTN, and history of cocaine use (patient states last use was 1 month ago) who was admitted to the hospital for left foot osteomyelitis after having undergone transmetatarsal amputation 2 weeks ago.  He underwent debridement by Dr. Greig Blush and podiatry yesterday.  He was started on vancomycin  and cefepime .  Patient states that he has had chronic right knee pain and swelling.  He underwent an aspiration by Dr. Sharrie on 10/06/2024 after he had elevated inflammatory markers and a right knee MRI showing a knee effusion, all with a prior history of bacteremia.  At that point approximately 65 cc of cloudy, light yellow joint fluid was aspirated.  Cell count at that time was 24.6 K with 94% neutrophils.  Culture was negative.  He was reaspirated on 10/11/2024 when he presented to the ED and cell count at that time was 20,160.  Patient states that his pain was relatively tolerable as he was able to walk into the hospital 2 days ago.  He underwent knee aspiration in the emergency department 2 days ago and approximately 70 cc of yellow fluid was aspirated.  This was not sent for analysis given to prior recent synovial fluid samples.  However, the patient states that since this time, he has had much more severe knee pain and he is unable to ambulate or significantly flex/extend his knee.  He has had recurrent effusion larger than previously.  Past Medical History:  Diagnosis Date   Amputation of right great toe    Cellulitis and abscess of foot    CKD stage 3a, GFR 45-59 ml/min (HCC)    Cocaine abuse (HCC)    + UDS on 05-12-24   DM (diabetes mellitus), type 2 (HCC)    ESBL (extended spectrum beta-lactamase)  producing bacteria infection 04/18/2024   Hyperlipidemia    Hypertension    Methicillin resistant Staphylococcus aureus infection 07/14/2024   MRSA bacteremia    Normocytic anemia    Obesity    Tobacco abuse    Toe osteomyelitis, left (HCC)    Tuberculosis    tested positive,treated with medications.   Past Surgical History:  Procedure Laterality Date   AMPUTATION TOE Right 03/26/2024   Procedure: AMPUTATION, TOE;  Surgeon: Harden Jerona GAILS, MD;  Location: [facility] OR;  Service: Orthopedics;  Laterality: Right;  RIGHT GREAT TOE AMPUTATION   APPLICATION OF WOUND VAC Left 11/04/2024   Procedure: APPLICATION, WOUND VAC;  Surgeon: Blush Greig MATSU, DPM;  Location: [facility] ORS;  Service: Podiatry;  Laterality: Left;  wound vac available intraop please.   BONE BIOPSY  04/18/2024   Procedure: BIOPSY, BONE (2ND METATARSAL);  Surgeon: Ashley Soulier, DPM;  Location: [facility] ORS;  Service: Orthopedics/Podiatry;;   FOOT SURGERY Left    I & D EXTREMITY Left 05/05/2022   Procedure: LEFT FOOT DEBRIDEMENT;  Surgeon: Harden Jerona GAILS, MD;  Location: [facility] OR;  Service: Orthopedics;  Laterality: Left;   INCISION AND DRAINAGE OF WOUND Right 06/05/2024   Procedure: DELAYED PRIMARY CLOSURE OF RIGHT FOOT WOUND, EXCISION OF DISTAL METATARSAL 2, 3, 4, and 5;  Surgeon: Ashley Soulier, DPM;  Location: [facility] ORS;  Service: Orthopedics/Podiatry;  Laterality: Right;   INCISION AND DRAINAGE OF WOUND Left 11/04/2024   Procedure: IRRIGATION AND DEBRIDEMENT WOUND;  Surgeon: Blush Greig MATSU,  DPM;  Location: [facility] ORS;  Service: Podiatry;  Laterality: Left;   IRRIGATION AND DEBRIDEMENT FOOT Right 04/18/2024   Procedure: IRRIGATION AND DEBRIDEMENT FOOT, PLACEMENT OF ANTIBIOTIC BEADS;  Surgeon: Ashley Soulier, DPM;  Location: [facility] ORS;  Service: Orthopedics/Podiatry;  Laterality: Right;   IRRIGATION AND DEBRIDEMENT FOOT Left 06/29/2024   Procedure: IRRIGATION AND DEBRIDEMENT FOOT;  Surgeon: Lennie Barter, DPM;  Location: [facility] ORS;  Service:  Orthopedics/Podiatry;  Laterality: Left;   MYRINGOTOMY WITH TUBE PLACEMENT Bilateral 07/20/2022   Procedure: MYRINGOTOMY WITH TUBE PLACEMENT;  Surgeon: Edda Mt, MD;  [facility];  Service: ENT;  Laterality: Bilateral;  Diabetic   TEE WITHOUT CARDIOVERSION N/A 07/02/2024   Procedure: ECHOCARDIOGRAM, TRANSESOPHAGEAL;  Surgeon: Perla Evalene PARAS, MD;  Location: [facility] ORS;  Service: Cardiovascular;  Laterality: N/A;   TONSILLECTOMY     TRANSMETATARSAL AMPUTATION Right 04/23/2024   Procedure: AMPUTATION, FOOT, TRANSMETATARSAL;  Surgeon: Ashley Soulier, DPM;  Location: [facility] ORS;  Service: Orthopedics/Podiatry;  Laterality: Right;   TRANSMETATARSAL AMPUTATION Right 06/29/2024   Procedure: AMPUTATION, FOOT, TRANSMETATARSAL;  Surgeon: Lennie Barter, DPM;  Location: [facility] ORS;  Service: Orthopedics/Podiatry;  Laterality: Right;  RIGHT TRANSMETATARSAL AMPUTATION REVISION   TRANSMETATARSAL AMPUTATION Left 10/17/2024   Procedure: AMPUTATION, FOOT, TRANSMETATARSAL;  Surgeon: Tanda Greig MATSU, DPM;  Location: [facility] ORS;  Service: Podiatry;  Laterality: Left;  pulse lavage available   Social History   Socioeconomic History   Marital status: Single    Spouse name: Not on file   Number of children: Not on file   Years of education: Not on file   Highest education level: Not on file  Occupational History   Not on file  Tobacco Use   Smoking status: Former    Current packs/day: 0.00    Average packs/day: 0.3 packs/day for 25.0 years (6.3 ttl pk-yrs)    Types: Cigarettes    Start date: 09/17/1997    Quit date: 09/17/2022    Years since quitting: 2.1   Smokeless tobacco: Never   Tobacco comments:    Quit  Vaping Use   Vaping status: Never Used  Substance and Sexual Activity   Alcohol use: No   Drug use: No   Sexual activity: Yes  Other Topics Concern   Not on file  Social History Narrative   Not on file   Social Drivers of Health   Financial Resource Strain: Patient Declined  (06/28/2023)   Received from [place] [facility] the [employer] Strain (CARDIA)    Difficulty of Paying Living Expenses: Patient declined  Food Insecurity: No Food Insecurity (11/03/2024)   Hunger Vital Sign    Worried About Running Out of Food in the Last Year: Never true    Ran Out of Food in the Last Year: Never true  Transportation Needs: No Transportation Needs (11/03/2024)   PRAPARE - Administrator, Civil Service (Medical): No    Lack of Transportation (Non-Medical): No  Physical Activity: Not on file  Stress: Not on file  Social Connections: Moderately Integrated (11/03/2024)   Social Connection and Isolation Panel    Frequency of Communication with Friends and Family: More than three times a week    Frequency of Social Gatherings with Friends and Family: More than three times a week    Attends Religious Services: 1 to 4 times per year    Active Member of [employer] or Organizations: Yes    Attends Banker Meetings: 1 to 4 times per year  Marital Status: Never married   Family History  Problem Relation Age of Onset   Colon cancer Neg Hx    Colon polyps Neg Hx    Crohn's disease Neg Hx    Esophageal cancer Neg Hx    Rectal cancer Neg Hx    Stomach cancer Neg Hx    Ulcerative colitis Neg Hx    No Known Allergies Prior to Admission medications   Medication Sig Start Date End Date Taking? Authorizing Provider  amoxicillin -clavulanate (AUGMENTIN ) 875-125 MG tablet Take 1 tablet by mouth 2 (two) times daily for 14 days. 10/23/24 11/06/24 Yes Agbata, Tochukwu, MD  bisacodyl  (DULCOLAX) 5 MG EC tablet Take 2 tablets (10 mg total) by mouth at bedtime as needed for moderate constipation. 04/29/24  Yes Von Bellis, MD  Cholecalciferol  (VITAMIN D3) 1.25 MG (50000 UT) CAPS Take 1 capsule (50,000 Units total) by mouth every Monday. 08/11/24  Yes   ciprofloxacin  (CIPRO ) 500 MG tablet Take 1 tablet (500 mg total) by mouth 2 (two) times daily for 14  days. 10/23/24 11/06/24 Yes Agbata, Tochukwu, MD  gabapentin  (NEURONTIN ) 300 MG capsule Take 1 capsule (300 mg total) by mouth 2 (two) times daily for neuropathy pain. 08/05/24  Yes   insulin  aspart (NOVOLOG ) 100 UNIT/ML FlexPen Inject 0-15 Units into the skin 3 (three) times daily before meals.  Correction coverage: Moderate (average weight, post-op) CBG 70 - 120: 0 units CBG 121 - 150: 2 units CBG 151 - 200: 3 units CBG 201 - 250: 5 units CBG 251 - 300: 8 units CBG 301 - 350: 11 units CBG 351 - 400: 15 units CBG > 400: call MD 04/29/24  Yes Von Bellis, MD  insulin  glargine-yfgn (SEMGLEE ) 100 UNIT/ML injection Inject 0.4 mLs (40 Units total) into the skin daily at 10 pm. 10/23/24  Yes Agbata, Tochukwu, MD  lactobacillus acidophilus & bulgar (LACTINEX) chewable tablet Chew 1 tablet by mouth 3 (three) times daily with meals for 14 days. 10/23/24 11/06/24 Yes Agbata, Tochukwu, MD  losartan  (COZAAR ) 25 MG tablet Take 0.5 tablets (12.5 mg total) by mouth daily. 09/29/24 09/29/25 Yes Samtani, Jai-Gurmukh, MD  metFORMIN  (GLUCOPHAGE -XR) 500 MG 24 hr tablet Take 2 tablets (1,000 mg total) by mouth 2 (two) times daily with a meal. 08/05/24  Yes   naloxone (NARCAN) nasal spray 4 mg/0.1 mL Place 0.4 mg into the nose once. 10/11/24  Yes [provider]  polyethylene glycol (MIRALAX  / GLYCOLAX ) 17 g packet Mix 1 packet (17 g) according to package instructions and take by mouth 2 (two) times daily. 10/23/24  Yes Agbata, Tochukwu, MD  senna (SENOKOT) 8.6 MG TABS tablet Take 1 tablet (8.6 mg total) by mouth 2 (two) times daily. 10/23/24  Yes Agbata, Tochukwu, MD  Continuous Blood Gluc Transmit (DEXCOM G6 TRANSMITTER) MISC Use to check blood sugar three times daily. Change transmitter once every 909 days. E11.69 06/05/22   Newlin, Enobong, MD  glucose blood (ACCU-CHEK GUIDE TEST) test strip Use to check blood sugar 3 times daily. 04/01/24   Newlin, Enobong, MD  Insulin  Pen Needle (TECHLITE PEN NEEDLES) 32G X 4  MM MISC Use as directed to inject insulin  up to 4 times daily. 08/11/24   Newlin, Enobong, MD  oxyCODONE -acetaminophen  (PERCOCET) 10-325 MG tablet Take 1 tablet by mouth every 6 (six) hours as needed.    [provider]   Recent Labs    11/03/24 1621 11/05/24 0546  WBC 10.9* 7.4  HGB 8.3* 7.5*  HCT 27.5* 24.4*  PLT  426* 352  K 4.8 4.6  CL 99 101  CO2 23 25  BUN 22* 21*  CREATININE 1.51* 1.35*  GLUCOSE 84 149*  CALCIUM  8.8* 8.4*   MR KNEE RIGHT W WO CONTRAST Result Date: 11/05/2024 EXAM: MRI of the right Knee With and Without Contrast. 11/05/2024 01:32:47 AM TECHNIQUE: Multiplanar multisequence MRI of the right knee was performed with and without intravenous contrast. CONTRAST: 10 mL of Gadavist . COMPARISON: MR Knee 09/28/2024. CLINICAL HISTORY: Chronic knee pain and swelling. FINDINGS: MEDIAL MENISCUS: Small grade 3 oblique tear of the posterior horn medial meniscus extends to the inferior meniscal surface as on images 22 through 25 series 12. LATERAL MENISCUS: Intact lateral meniscus. ANTERIOR CRUCIATE LIGAMENT: Intact anterior cruciate ligament. POSTERIOR CRUCIATE LIGAMENT: Intact posterior cruciate ligament. EXTENSOR MECHANISM: Intact quadriceps and patellar tendons. Intact patellar retinacula. LATERAL COLLATERAL LIGAMENT COMPLEX: Intact IT band, lateral collateral ligament proper, biceps femoris tendon and popliteus tendon. MEDIAL COLLATERAL LIGAMENT COMPLEX: The superficial and deep components of the medial collateral ligament are intact. KNEE JOINT: Mild degenerative chondral thinning in the medial compartment. Large knee joint effusion with relatively thin synovial enhancement. The effusion has increased in volume compared to the 09/28/2024 examination. Large amount of fluid in the suprapatellar bursa. Fluid extends down in the popliteus recess. Small baker cyst. BONE MARROW: Progressive marrow edema along the tibial spine, etiology uncertain. Mild progressive marrow edema below the  medial patellar facet. SOFT TISSUE: Infiltrative edema in the popliteal space. Enlarged lymph nodes in the upper popliteal region and along the distal femoral artery up to 1.2 cm in short axis on image 17 series 12, probably reactive lymph nodes. Edema signal in the popliteus muscle, cannot exclude muscle strain or tear. Mild edema along the distal vastus medialis muscle posteriorly. Prepatellar and anterior subcutaneous edema along the knee. IMPRESSION: 1. Large knee joint effusion with thin synovial enhancement, increased in volume compared to the prior examination, with large suprapatellar bursal fluid and fluid extending into the popliteus recess and a small Baker cyst. Given the progressive volume, likely reactive adenopathy, new marrow edema along the tibial spine, septic arthritis is not entirely excluded and arthrocentesis may be indicated. 2. Small grade 3 oblique tear of the posterior horn medial meniscus extending to the inferior meniscal surface. 3. Mild degenerative chondral thinning in the medial compartment. 4. Edema signal in the popliteus muscle, cannot exclude strain or tear, with mild edema along the distal vastus medialis muscle posteriorly. 5. Progressive marrow edema along the tibial spine, etiology uncertain, with mild progressive marrow edema below the medial patellar facet. 6. Enlarged lymph nodes in the upper popliteal region and along the distal femoral artery, likely reactive. Electronically signed by: Ryan Salvage MD 11/05/2024 09:41 AM EST RP Workstation: [id number]   DG Foot 2 Views Left Result Date: 11/03/2024 EXAM: 2 VIEW(S) XRAY OF THE LEFT FOOT 11/03/2024 05:45:00 PM COMPARISON: 10/23/2024 CLINICAL HISTORY: infection FINDINGS: BONES AND JOINTS: Suspect developing lucency within the distal most 2nd metatarsal. Periosteal reaction about the 5th metatarsal shaft is unchanged. No acute fracture. No joint dislocation. SOFT TISSUES: Soft tissue defect distal to the remaining 2nd  metatarsal. Soft tissue gas and overlying bandaged material. IMPRESSION: 1. Soft tissue ulcer distal to the 2nd metatarsal with suggestion of osteomyelitis within the distal most 2nd metatarsal shaft. Consider further evaluation with pre and postcontrast MRI. Electronically signed by: Rockey Kilts MD 11/03/2024 06:33 PM EST RP Workstation: [id number]     Positive ROS: All other systems have been reviewed and were otherwise  negative with the exception of those mentioned in the HPI and as above.  Physical Exam: BP 130/79 (BP Location: Right Arm)   Pulse 87   Temp 98.9 F (37.2 C)   Resp 17   Ht 5' 9 (1.753 m)   Wt 113.1 kg   SpO2 98%   BMI 36.82 kg/m  General:  Alert, no acute distress Psychiatric:  Patient is competent for consent with normal mood and affect     Orthopedic Exam:  RLE: 5/5 DF/PF/EHL SILT s/s/t/sp/dp distr Foot wwp RoM knee: 0-30 before severe pain  Imaging:  As above: Large joint effusion on MRI.  Mild degenerative changes present to the knee joint.  Assessment/Plan: 58 year old male with severe right knee pain. 1.  We discussed the differential diagnosis as well as the possibility of knee joint infection, given his clinical symptoms.  After discussion of various diagnostic and treatment options, the patient agreed to proceed with an arthrocentesis of the right knee.  Please see procedure note below for further details.  2.  Synovial fluid analysis shows a cell count of 83,000 with 98% PMNs.  No crystals noted.  Given the patient's clinical exam and synovial fluid analysis, this is concerning for a septic arthritis of the right knee.  3.  The patient recently had lunch.  He is clinically stable.  He has no signs of sepsis currently as he has been afebrile with relatively normal vitals.  He is currently on IV antibiotics from debridement of the left foot.  Given all of this, we will plan for surgery in the form right knee arthroscopic irrigation and debridement  tomorrow.  If there is any change in his clinical condition, please contact me and we can consider doing emergency surgery if his clinical condition necessitates.  4.  N.p.o. after midnight.   Procedure Note - Right Knee Aspiration I reviewed with the patient the procedure of L knee joint aspiration and discussed the risks, benefits, and alternative treatments. We discussed the potential risks of infection, increased pain, and incomplete relief or temporary relief of symptoms. Verbal consent was obtained.  A time-out was conducted to verify correct patient identity, procedure to be performed, and correct side and site. The skin was marked and then prepped in usual sterile fashion. Superolateral approach was used, and I obtained ~80cc of cloudy, purulent fluid. The patient tolerated the procedure adequately. Aspiration sent for cell count, culture/gram stain, and crystals.      Earnestine Blanch   11/05/2024 4:15 PM

## 2024-11-05 NOTE — Progress Notes (Signed)
 PODIATRY: PROGRESS NOTE    Surgery:  Procedure(s) (LRB): IRRIGATION AND DEBRIDEMENT WOUND (Left) APPLICATION, WOUND VAC (Left) POD:  1 Day Post-Op  O/N: NAEON  Subjective:  Had large appetite post op.  Patient currently asleep. Stable.  Wound vac assessed,  good suction / seal. No issues with connection. Perfusion to foot noted. Dressings intact with buffer of tube.   PHYSICAL EXAMINATION: BP 124/85 (BP Location: Right Arm)   Pulse 84   Temp 98.5 F (36.9 C)   Resp 15   Ht 5' 9 (1.753 m)   Wt 113.1 kg   SpO2 99%   BMI 36.82 kg/m ? GEN: NAD. AOX3. ? RESP: Non-labored breathing on RA.? ABD: NT/ND of all four quadrants.? NEURO: Moving all four extremities spontaneously.Issues with R knee still - difficulty flexing / extending.  ? FOCUSED LOWER EXTREMITY EXAMINATION: LEFT Limited by patient sleep status.    Neurological:  - Protective sensation diminished / absent - Gross protective sensation diminished bilaterally.  - No focal motor or sensory deficits identified bilaterally.    Vascular: LEFT - Dorsalis Pedis artery on palpation: 2 - Posterior Tibial artery on palpation: 1 - Capillary Filling Times: sluggish - Peripheral edema: localized to distal L forefoot   Musculoskeletal:  S/p R TMA  S/p L TMA with open defect Muscle strength: 5/5 in all 4 quadrants  Ankle Joint ROM: decreased, equinus noted Global foot - TTP: none noted.    DERM: ? - Dressings c/d/I. ? VAC in place 125 mmHg holding continuous suction without any seal leak. - No proximal streaking. No malodor. ? - No clinical signs of infection noted. ??    Results for orders placed or performed during the hospital encounter of 11/03/24  Aerobic/Anaerobic Culture w Gram Stain (surgical/deep wound)     Status: None (Preliminary result)   Collection Time: 11/03/24  7:56 PM   Specimen: Wound  Result Value Ref Range Status   Specimen Description   Final    WOUND Performed at [facility],  [address] [place], [place] [address]    Special Requests   Final    NONE Performed at [facility], [address] [place], [place] [address]    Gram Stain   Final    NO WBC SEEN MODERATE GRAM POSITIVE COCCI RARE GRAM NEGATIVE RODS RARE GRAM POSITIVE RODS    Culture   Final    MODERATE ENTEROBACTER HORMAECHEI SUSCEPTIBILITIES TO FOLLOW CULTURE REINCUBATED FOR BETTER GROWTH MODERATE MORGANELLA MORGANII SUBCULTURING FOR SUSCEPTIBILITIES Performed at [facility], [address] [place], [place] [address]    Report Status PENDING  Incomplete  MRSA Next Gen by PCR, Nasal     Status: None   Collection Time: 11/04/24  1:21 AM   Specimen: Nasal Mucosa; Nasal Swab  Result Value Ref Range Status   MRSA by PCR Next Gen NOT DETECTED NOT DETECTED Final    Comment: (NOTE) The GeneXpert MRSA Assay (FDA approved for NASAL specimens only), is one component of a comprehensive MRSA colonization surveillance program. It is not intended to diagnose MRSA infection nor to guide or monitor treatment for MRSA infections. Test performance is not FDA approved in patients less than 41 years old. Performed at [facility], [address] [place], [place] [address]      ASSESSMENT:?  Dehiscence of TMA surgical site with extensive necrotic tissue, left foot s/p irrigation and debridement of all nonviable soft tissue with wound vac placement - doing well.  Type 2 diabetes c/b polyneuropathy and chronic kidney disease  In interval time from postoperative period, patient was able to have consultation from orthopedic colleague, Dr. Earnestine Blanch.  The right knee effusion and subsequent aspiration from consultation provided concern for septic joint right knee necessitating washout tomorrow.  I appreciate the cyst and acute evaluation re: right knee pain during this hospitalization.    PLAN:? - Activity: STRICT NWB to the LLE.   - Diet:NPO at midnight for planned  procedure right knee  - Wound Care: Wound care consult order placed for wound VAC change Monday, Wednesday, Friday. Continuous suction held 125 mmHg  Adaptic (clear mesh) was placed overlying incision sites as well as deep space to distal medial TMA stump site with overlying black sponge and bridge connection over incision sites.  - ABX: Appreciate assistance with antibiotic stewardship from medicine / pharmacy / ID services.  Anti-infectives (From admission, onward)    Start     Dose/Rate Route Frequency Ordered Stop   11/05/24 1500  vancomycin  (VANCOREADY) IVPB 1500 mg/300 mL        1,500 mg 150 mL/hr over 120 Minutes Intravenous Every 24 hours 11/05/24 1023     11/04/24 1500  ceFEPIme  (MAXIPIME ) 2 g in sodium chloride  0.9 % 100 mL IVPB        2 g 200 mL/hr over 30 Minutes Intravenous Every 8 hours 11/04/24 1405     11/04/24 1500  vancomycin  (VANCOCIN ) IVPB 1000 mg/200 mL premix       Placed in And Linked Group   1,000 mg 200 mL/hr over 60 Minutes Intravenous  Once 11/04/24 1405 11/04/24 1658   11/04/24 1500  vancomycin  (VANCOREADY) IVPB 1500 mg/300 mL       Placed in And Linked Group   1,500 mg 150 mL/hr over 120 Minutes Intravenous  Once 11/04/24 1405 11/04/24 1835   11/04/24 1418  vancomycin  variable dose per unstable renal function (pharmacist dosing)  Status:  Discontinued         Does not apply See admin instructions 11/04/24 1418 11/05/24 1023

## 2024-11-05 NOTE — Evaluation (Addendum)
 Physical Therapy Evaluation Patient Details Name: [name] MRN: [id number] DOB: Apr 27, 1966 Today's Date: 11/05/2024  History of Present Illness  Pt is a 58 y/o M admitted on 10/15/24 after presenting with R knee pain & L foot wounds. 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 Pt is s/p L foot transmet amputation on 10/17/24 by podiatry. PMH: IDDM2, CKD 3A, obesity, HLD, HTN, cocaine abuse, RLE TMA. Pt is s/p irrigation and debridement (left) and wound vac application (left) 11/04/24.  Clinical Impression  Pt was sitting upright EOB on arrival, was in pain 10/10 and unmotivated to participate during the session. Pt needed significant encouragement to attempt mobilization and standing during the session, reporting that his R knee pain was too extensive for him to tolerate any movement at all. Pt was educated extensively on the benefits of mobility, despite being in pain and eventually attempted standing balance at bedside. With a RW, pt was able to stand for a few seconds, and was heavily reliant on walker, before needing to sit back down. Pt only needed supervision for standing balance. Pt will benefit from continued PT services upon discharge to safely address deficits listed in patient problem list for decreased caregiver assistance and eventual return to PLOF.     If plan is discharge home, recommend the following: A little help with walking and/or transfers;A little help with bathing/dressing/bathroom;Assist for transportation;Assistance with cooking/housework;Help with stairs or ramp for entrance   Can travel by private vehicle   No    Equipment Recommendations    Recommendations for Other Services       Functional Status Assessment Patient has had a recent decline in their functional status and demonstrates the ability to make significant improvements in function in a reasonable and predictable amount of time.     Precautions /  Restrictions Precautions Precautions: Fall Recall of Precautions/Restrictions: Impaired Precaution/Restrictions Comments: CAM boot LLE, boot RLE Required Braces or Orthoses: Other Brace Restrictions Weight Bearing Restrictions Per Provider Order:     Mobility  Bed Mobility Overal bed mobility: Independent Bed Mobility: Sit to Supine, Supine to Sit     Supine to sit: Modified independent (Device/Increase time) Sit to supine: Modified independent (Device/Increase time)   General bed mobility comments: pt sitting upright EOB on arrival    Transfers Overall transfer level: Needs assistance Equipment used: Rolling walker (2 wheels) Transfers: Sit to/from Stand Sit to Stand: Supervision (unable to tolerate more than 10 sec of standing due to R knee pain) Unable to maintain NWB on the left --reliant on UE with RW                 Ambulation/Gait - unable/unsafe                  Stairs            Wheelchair Mobility     Tilt Bed    Modified Rankin (Stroke Patients Only)       Balance Overall balance assessment: Needs assistance Sitting-balance support: Feet supported Sitting balance-Leahy Scale: Normal     Standing balance support: No upper extremity supported, Reliant on assistive device for balance Standing balance-Leahy Scale: Poor Standing balance comment: pt was unable to tolerate standing balance due to R knee pain 10/10                             Pertinent Vitals/Pain Pain Assessment Pain Assessment:  0-10 Pain Score: 10-Worst pain ever Pain Location: Left foot, R knee Pain Descriptors / Indicators: Constant, Discomfort, Grimacing, Guarding Pain Intervention(s): Limited activity within patient's tolerance, Monitored during session, Repositioned    Home Living Family/patient expects to be discharged to:: Private residence Living Arrangements: Alone Available Help at Discharge: Family;Available PRN/intermittently Type of  Home: Apartment Home Access: Stairs to enter   Entrance Stairs-Number of Steps: 1     Home Equipment: Agricultural Consultant (2 wheels);Wheelchair - manual;Shower seat Additional Comments: pt reports he was self selecting NWB at home, primarily using a wc but was able to use RW to walk into to ED. Pts reports of mobility was inconsistent (w/c vs RW) throughout. Pts R knee pain is the primary pain source and reason for self selecting NWB    Prior Function Prior Level of Function : Independent/Modified Independent Recently in rehab -with minimal tolerance for upright mobility. Prior to a few months ago, pt reports full ind             Mobility Comments: Pt reports he primarily uses w/c but has to ambulate into bathroom ADLs Comments: ind     Extremity/Trunk Assessment        Lower Extremity Assessment Lower Extremity Assessment: LLE deficits/detail;RLE deficits/detail RLE Deficits / Details: R TMA, R knee pain RLE: Unable to fully assess due to pain LLE Deficits / Details: L TMA LLE: Unable to fully assess due to pain    Cervical / Trunk Assessment Cervical / Trunk Assessment: Normal  Communication   Communication Communication: No apparent difficulties    Cognition Arousal: Alert Behavior During Therapy: WFL for tasks assessed/performed   PT - Cognitive impairments: Safety/Judgement                       PT - Cognition Comments: pt was frustrated in response to needing to mobilize Following commands: Intact Following commands impaired: Follows one step commands inconsistently     Cueing Cueing Techniques: Verbal cues     General Comments General comments (skin integrity, edema, etc.): pt needed significant encouragement to mobilize and stand with rolling walker due to pain. R knee edema, R and L foot wrapped in ace bandages    Exercises Other Exercises Other Exercises: focus of session was mainly on pt edu/benefits of mobilzing   Assessment/Plan    PT  Assessment Patient needs continued PT services  PT Problem List Decreased strength;Decreased balance;Decreased activity tolerance;Decreased mobility;Decreased knowledge of precautions;Decreased safety awareness;Decreased knowledge of use of DME;Decreased skin integrity;Pain       PT Treatment Interventions DME instruction;Balance training;Gait training;Neuromuscular re-education;Stair training;Functional mobility training;Patient/family education;Therapeutic activities;Manual techniques;Therapeutic exercise    PT Goals (Current goals can be found in the Care Plan section)  Acute Rehab PT Goals Patient Stated Goal: go back to rehab PT Goal Formulation: With patient Time For Goal Achievement: 11/18/24 Potential to Achieve Goals: Fair    Frequency Min 2X/week     Co-evaluation               AM-PAC PT 6 Clicks Mobility  Outcome Measure Help needed turning from your back to your side while in a flat bed without using bedrails?: None Help needed moving from lying on your back to sitting on the side of a flat bed without using bedrails?: None Help needed moving to and from a bed to a chair (including a wheelchair)?: None Help needed standing up from a chair using your arms (e.g., wheelchair or bedside chair)?: A  Lot Help needed to walk in hospital room?: Total Help needed climbing 3-5 steps with a railing? : Total 6 Click Score: 16    End of Session   Activity Tolerance: Patient limited by pain Patient left: in bed;with call bell/phone within reach;with bed alarm set Nurse Communication: Mobility status PT Visit Diagnosis: Other abnormalities of gait and mobility (R26.89);Difficulty in walking, not elsewhere classified (R26.2);Muscle weakness (generalized) (M62.81)    Time: 8651-8593 PT Time Calculation (min) (ACUTE ONLY): 18 min   Charges:                Corean Newport, SPT 11/05/24, 3:29 PM This entire session was performed under direct supervision and  direction of a licensed therapist/therapist assistant. I have personally read, edited and approve of the note as written.  Carmin Deed, DPT

## 2024-11-05 NOTE — Progress Notes (Addendum)
 PROGRESS NOTE  [name] FMW:[id number] DOB: [birth year] DOA: 11/03/2024 PCP: [name], MD   LOS: 2 days   Brief narrative:  Mr. [name] is a 58 year old male with history of insulin -dependent diabetes mellitus type 2, with diabetic foot ulcer, CKD 3A, hyperlipidemia, hypertension, cocaine abuse, presented to hospital with left foot wounds and right knee pain for several weeks with drainage which got worse for about a day.  Patient was then admitted to the hospital for left foot infection concerning for osteomyelitis with history of left TMA on 10/17/2024.  Subsequently wound dehiscence was present.  Podiatry was consulted and patient underwent TMA with debridement on 11/04/2024.  On cefepime  and vancomycin .     Assessment/Plan: Principal Problem:   Wound infection Active Problems:   Diabetic foot ulcer (HCC)   Tobacco abuse   Obesity (BMI 30-39.9)   Right knee pain  Chronic osteomyelitis of the left foot.   Diabetic foot ulcer.    Status post irrigation and debridement of the left foot wound with wound VAC placement on 11/04/2024 by podiatry.  Follow-up podiatry further recommendations.  Continue vancomycin  and cefepime .  Follow culture of the debrided sample showing moderate gram-positive cocci and gram-negative rods.  Temperature max of 99.2 F.  No leukocytosis present.  Diabetes mellitus type 2.  Continue sliding scale insulin  Accu-Cheks diabetic diet.  Closely monitor blood glucose levels.  Class II obesity.  Body mass index is 36.82 kg/m.  Patient would benefit from ongoing weight loss as outpatient.  Right knee pain with joint effusion..  States that he did have right knee swelling for a month or so and had aspiration in the past without any infection states that he did have some inflammation but has not been improving and states that is limiting his mobility especially in the context of left foot surgery.  MRI of the right knee showed large knee joint effusion with  synovial enhancement with grade 3 oblique tear of the posterior horn of the medial meniscus.  Communicated with orthopedics Dr. Tobie for consultation.  DVT prophylaxis: Will resume Lovenox  if okay with orthopedics.   Disposition: Uncertain at this time but will get PT evaluation  Status is: Inpatient Remains inpatient appropriate because: Status post irrigation and debridement, pending clinical improvement, orthopedic and podiatry follow-up    Code Status:     Code Status: Full Code  Family Communication: None at bedside  Consultants: Podiatry Orthopedics  Procedures: Irrigation and debridement of the left foot wounds with wound VAC placement.  Anti-infectives:  Vancomycin  and cefepime   Anti-infectives (From admission, onward)    Start     Dose/Rate Route Frequency Ordered Stop   11/05/24 1500  vancomycin  (VANCOREADY) IVPB 1500 mg/300 mL        1,500 mg 150 mL/hr over 120 Minutes Intravenous Every 24 hours 11/05/24 1023     11/04/24 1500  ceFEPIme  (MAXIPIME ) 2 g in sodium chloride  0.9 % 100 mL IVPB        2 g 200 mL/hr over 30 Minutes Intravenous Every 8 hours 11/04/24 1405     11/04/24 1500  vancomycin  (VANCOCIN ) IVPB 1000 mg/200 mL premix       Placed in And Linked Group   1,000 mg 200 mL/hr over 60 Minutes Intravenous  Once 11/04/24 1405 11/04/24 1658   11/04/24 1500  vancomycin  (VANCOREADY) IVPB 1500 mg/300 mL       Placed in And Linked Group   1,500 mg 150 mL/hr over 120 Minutes Intravenous  Once 11/04/24 1405  11/04/24 1835   11/04/24 1418  vancomycin  variable dose per unstable renal function (pharmacist dosing)  Status:  Discontinued         Does not apply See admin instructions 11/04/24 1418 11/05/24 1023        Subjective: Today, patient was seen and examined at bedside.  Patient complains of right knee pain and swelling and tenderness limiting his mobility.  Denies any nausea vomiting fever chills or rigor.  Objective: Vitals:   11/05/24 0409  11/05/24 0810  BP: 138/86 130/79  Pulse: 91 87  Resp: 18 17  Temp: 98.9 F (37.2 C) 98.9 F (37.2 C)  SpO2: 98% 98%    Intake/Output Summary (Last 24 hours) at 11/05/2024 1126 Last data filed at 11/05/2024 1025 Gross per 24 hour  Intake 600.63 ml  Output 1275 ml  Net -674.37 ml   Filed Weights   11/03/24 1615 11/03/24 2026  Weight: 102.1 kg 113.1 kg   Body mass index is 36.82 kg/m.   Physical Exam: GENERAL: Patient is alert awake and oriented. Not in obvious distress.  Obese built HENT: No scleral pallor or icterus. Pupils equally reactive to light. Oral mucosa is moist NECK: is supple, no gross swelling noted. CHEST: Clear to auscultation. No crackles or wheezes.  Diminished breath sounds bilaterally. CVS: S1 and S2 heard, no murmur. Regular rate and rhythm.  ABDOMEN: Soft, non-tender, bowel sounds are present. EXTREMITIES: Right knee with gross effusion and tenderness on palpation.  Left foot with dressing and wound VAC. CNS: Cranial nerves are intact. No focal motor deficits. SKIN: warm and left foot with dressing and wound VAC  Data Review: I have personally reviewed the following laboratory data and studies,  CBC: Recent Labs  Lab 11/03/24 1621 11/05/24 0546  WBC 10.9* 7.4  HGB 8.3* 7.5*  HCT 27.5* 24.4*  MCV 79.5* 78.7*  PLT 426* 352   Basic Metabolic Panel: Recent Labs  Lab 11/03/24 1621 11/05/24 0546  NA 133* 135  K 4.8 4.6  CL 99 101  CO2 23 25  GLUCOSE 84 149*  BUN 22* 21*  CREATININE 1.51* 1.35*  CALCIUM  8.8* 8.4*   Liver Function Tests: No results for input(s): AST, ALT, ALKPHOS, BILITOT, PROT, ALBUMIN in the last 168 hours. No results for input(s): LIPASE, AMYLASE in the last 168 hours. No results for input(s): AMMONIA in the last 168 hours. Cardiac Enzymes: No results for input(s): CKTOTAL, CKMB, CKMBINDEX, TROPONINI in the last 168 hours. BNP (last 3 results) No results for input(s): BNP in the last 8760  hours.  ProBNP (last 3 results) No results for input(s): PROBNP in the last 8760 hours.  CBG: Recent Labs  Lab 11/04/24 1626 11/04/24 1826 11/04/24 2017 11/04/24 2119 11/05/24 0811  GLUCAP 104* 97 85 160* 137*   Recent Results (from the past 240 hours)  Aerobic/Anaerobic Culture w Gram Stain (surgical/deep wound)     Status: None (Preliminary result)   Collection Time: 11/03/24  7:56 PM   Specimen: Wound  Result Value Ref Range Status   Specimen Description   Final    WOUND Performed at [facility], [address] [place], [place] [address]    Special Requests   Final    NONE Performed at [facility], [address] [place], [place] [address]    Gram Stain   Final    NO WBC SEEN MODERATE GRAM POSITIVE COCCI RARE GRAM NEGATIVE RODS RARE GRAM POSITIVE RODS    Culture   Final  MODERATE GRAM NEGATIVE RODS SUSCEPTIBILITIES TO FOLLOW CULTURE REINCUBATED FOR BETTER GROWTH Performed at [facility], [address] [place], [place] [address]    Report Status PENDING  Incomplete  MRSA Next Gen by PCR, Nasal     Status: None   Collection Time: 11/04/24  1:21 AM   Specimen: Nasal Mucosa; Nasal Swab  Result Value Ref Range Status   MRSA by PCR Next Gen NOT DETECTED NOT DETECTED Final    Comment: (NOTE) The GeneXpert MRSA Assay (FDA approved for NASAL specimens only), is one component of a comprehensive MRSA colonization surveillance program. It is not intended to diagnose MRSA infection nor to guide or monitor treatment for MRSA infections. Test performance is not FDA approved in patients less than 1 years old. Performed at [facility], [address] [place], [place] [address]      Studies: MR KNEE RIGHT W WO CONTRAST Result Date: 11/05/2024 EXAM: MRI of the right Knee With and Without Contrast. 11/05/2024 01:32:47 AM TECHNIQUE: Multiplanar multisequence MRI of the right knee was performed with and without intravenous  contrast. CONTRAST: 10 mL of Gadavist . COMPARISON: MR Knee 09/28/2024. CLINICAL HISTORY: Chronic knee pain and swelling. FINDINGS: MEDIAL MENISCUS: Small grade 3 oblique tear of the posterior horn medial meniscus extends to the inferior meniscal surface as on images 22 through 25 series 12. LATERAL MENISCUS: Intact lateral meniscus. ANTERIOR CRUCIATE LIGAMENT: Intact anterior cruciate ligament. POSTERIOR CRUCIATE LIGAMENT: Intact posterior cruciate ligament. EXTENSOR MECHANISM: Intact quadriceps and patellar tendons. Intact patellar retinacula. LATERAL COLLATERAL LIGAMENT COMPLEX: Intact IT band, lateral collateral ligament proper, biceps femoris tendon and popliteus tendon. MEDIAL COLLATERAL LIGAMENT COMPLEX: The superficial and deep components of the medial collateral ligament are intact. KNEE JOINT: Mild degenerative chondral thinning in the medial compartment. Large knee joint effusion with relatively thin synovial enhancement. The effusion has increased in volume compared to the 09/28/2024 examination. Large amount of fluid in the suprapatellar bursa. Fluid extends down in the popliteus recess. Small baker cyst. BONE MARROW: Progressive marrow edema along the tibial spine, etiology uncertain. Mild progressive marrow edema below the medial patellar facet. SOFT TISSUE: Infiltrative edema in the popliteal space. Enlarged lymph nodes in the upper popliteal region and along the distal femoral artery up to 1.2 cm in short axis on image 17 series 12, probably reactive lymph nodes. Edema signal in the popliteus muscle, cannot exclude muscle strain or tear. Mild edema along the distal vastus medialis muscle posteriorly. Prepatellar and anterior subcutaneous edema along the knee. IMPRESSION: 1. Large knee joint effusion with thin synovial enhancement, increased in volume compared to the prior examination, with large suprapatellar bursal fluid and fluid extending into the popliteus recess and a small Baker cyst. Given the  progressive volume, likely reactive adenopathy, new marrow edema along the tibial spine, septic arthritis is not entirely excluded and arthrocentesis may be indicated. 2. Small grade 3 oblique tear of the posterior horn medial meniscus extending to the inferior meniscal surface. 3. Mild degenerative chondral thinning in the medial compartment. 4. Edema signal in the popliteus muscle, cannot exclude strain or tear, with mild edema along the distal vastus medialis muscle posteriorly. 5. Progressive marrow edema along the tibial spine, etiology uncertain, with mild progressive marrow edema below the medial patellar facet. 6. Enlarged lymph nodes in the upper popliteal region and along the distal femoral artery, likely reactive. Electronically signed by: Ryan Salvage MD 11/05/2024 09:41 AM EST RP Workstation: [id number]   DG Foot 2 Views Left Result Date:  11/03/2024 EXAM: 2 VIEW(S) XRAY OF THE LEFT FOOT 11/03/2024 05:45:00 PM COMPARISON: 10/23/2024 CLINICAL HISTORY: infection FINDINGS: BONES AND JOINTS: Suspect developing lucency within the distal most 2nd metatarsal. Periosteal reaction about the 5th metatarsal shaft is unchanged. No acute fracture. No joint dislocation. SOFT TISSUES: Soft tissue defect distal to the remaining 2nd metatarsal. Soft tissue gas and overlying bandaged material. IMPRESSION: 1. Soft tissue ulcer distal to the 2nd metatarsal with suggestion of osteomyelitis within the distal most 2nd metatarsal shaft. Consider further evaluation with pre and postcontrast MRI. Electronically signed by: Rockey Kilts MD 11/03/2024 06:33 PM EST RP Workstation: [id number]      Vernal Alstrom, MD  [facility] 11/05/2024  If 7PM-7AM, please contact night-coverage

## 2024-11-05 NOTE — TOC Initial Note (Signed)
 Transition of Care [facility]) - Initial/Assessment Note    Patient Details  Name: [name] MRN: [id number] Date of Birth: 30-Sep-1966  [facility] CM/SW Contact:    Alvaro Louder, LCSW Phone Number: 11/05/2024, 3:26 PM  Clinical Narrative:    Per chart review patient is from home, PCP is Enobong Newlin. LCSWA faxed out patient to SNF's in [place]. TOC to present facilities to patient at the bedside.              TOC to follow for discharge        Patient Goals and CMS Choice            Expected Discharge Plan and Services                                              Prior Living Arrangements/Services                       Activities of Daily Living   ADL Screening (condition at time of admission) Independently performs ADLs?: No Does the patient have a NEW difficulty with bathing/dressing/toileting/self-feeding that is expected to last >3 days?: No Does the patient have a NEW difficulty with getting in/out of bed, walking, or climbing stairs that is expected to last >3 days?: Yes (Initiates electronic notice to provider for possible PT consult) Does the patient have a NEW difficulty with communication that is expected to last >3 days?: No Is the patient deaf or have difficulty hearing?: No Does the patient have difficulty seeing, even when wearing glasses/contacts?: No Does the patient have difficulty concentrating, remembering, or making decisions?: No  Permission Sought/Granted                  Emotional Assessment              Admission diagnosis:  Wound infection [T14.8XXA, L08.9] Foot infection [L08.9] Chronic pain of right knee [M25.561, G89.29] Patient Active Problem List   Diagnosis Date Noted   Infected ulcer of skin, with necrosis of bone (HCC) 10/21/2024   Osteomyelitis of second toe (HCC) 10/21/2024   Non-healing ulcer of left foot (HCC) 10/15/2024   Right knee pain 09/27/2024   Acute renal failure  superimposed on stage 3a chronic kidney disease, unspecified acute renal failure type (HCC) 09/26/2024   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/02/2024   Wound infection 07/01/2024   Uncontrolled type 2 diabetes mellitus with hyperglycemia, with long-term current use of insulin  (HCC) 06/27/2024   Acute osteomyelitis of right foot (HCC) 06/26/2024   MRSA bacteremia 04/21/2024   HLD (hyperlipidemia) 04/17/2024   Chronic kidney disease, stage 3a (HCC) 04/17/2024   Normocytic anemia 04/17/2024   Amputation of right great toe 03/31/2024   Necrotizing soft tissue infection 03/26/2024   Hypertension associated with diabetes (HCC) 12/12/2022   Toe osteomyelitis, left (HCC) 12/12/2022   Acute on chronic osteomyelitis (HCC) 06/02/2022   PICC (peripherally inserted central catheter) in place 06/02/2022   Medication management 06/02/2022   Diabetic foot infection (HCC) 06/02/2022   Smoking 06/02/2022   Tobacco abuse 05/06/2022   Hyperkalemia 05/06/2022   Obesity (BMI 30-39.9) 05/05/2022   Cellulitis and abscess of foot    Injury of left foot    Diabetic foot ulcer (HCC) 12/14/2021   PCP:  [name], MD Pharmacy:   [facility]  Pharmacy [address] [place] [address] Phone: [phone] Fax: [phone]  [id number] [place], [place] - [address] AT [facility] [address] [place] [address] Phone: [phone] Fax: [phone]  [facility] [address] [place] [address] Phone: [phone] Fax: [phone]     Social Drivers of Health (SDOH) Social History: SDOH Screenings   Food Insecurity: No Food Insecurity (11/03/2024)  Housing: Low Risk  (11/03/2024)  Transportation Needs: No Transportation Needs (11/03/2024)  Utilities: Not At Risk (11/03/2024)  Depression (PHQ2-9): Low Risk  (08/28/2023)  Financial Resource Strain: Patient Declined (06/28/2023)    Received from [facility]  Social Connections: Moderately Integrated (11/03/2024)  Tobacco Use: Medium Risk (11/03/2024)  Health Literacy: Adequate Health Literacy (05/01/2024)   SDOH Interventions:     Readmission Risk Interventions    11/04/2024    4:01 PM  Readmission Risk Prevention Plan  Transportation Screening Complete  Medication Review (RN Care Manager) Complete  PCP or Specialist appointment within 3-5 days of discharge Complete  HRI or Home Care Consult Not Complete  HRI or Home Care Consult Pt Refusal Comments Going to SNF  SW Recovery Care/Counseling Consult Complete  Palliative Care Screening Not Applicable  Skilled Nursing Facility Complete

## 2024-11-05 NOTE — [place] FL2 (Signed)
 [place]  MEDICAID FL2 LEVEL OF CARE FORM     IDENTIFICATION  Patient Name: [name] Birthdate: 1966-02-14 Sex: male Admission Date (Current Location): 11/03/2024  [facility] and [place] Number:  Chiropodist and Address:  [facility], [address], [place], [place] [address]      Provider Number: [id number]  Attending Physician Name and Address:  Sonjia Held, MD  Relative Name and Phone Number:       Current Level of Care: Hospital Recommended Level of Care: Skilled Nursing Facility Prior Approval Number:    Date Approved/Denied:   PASRR Number: [id number]  Discharge Plan: SNF    Current Diagnoses: Patient Active Problem List   Diagnosis Date Noted   Infected ulcer of skin, with necrosis of bone (HCC) 10/21/2024   Osteomyelitis of second toe (HCC) 10/21/2024   Non-healing ulcer of left foot (HCC) 10/15/2024   Right knee pain 09/27/2024   Acute renal failure superimposed on stage 3a chronic kidney disease, unspecified acute renal failure type (HCC) 09/26/2024   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/02/2024   Wound infection 07/01/2024   Uncontrolled type 2 diabetes mellitus with hyperglycemia, with long-term current use of insulin  (HCC) 06/27/2024   Acute osteomyelitis of right foot (HCC) 06/26/2024   MRSA bacteremia 04/21/2024   HLD (hyperlipidemia) 04/17/2024   Chronic kidney disease, stage 3a (HCC) 04/17/2024   Normocytic anemia 04/17/2024   Amputation of right great toe 03/31/2024   Necrotizing soft tissue infection 03/26/2024   Hypertension associated with diabetes (HCC) 12/12/2022   Toe osteomyelitis, left (HCC) 12/12/2022   Acute on chronic osteomyelitis (HCC) 06/02/2022   PICC (peripherally inserted central catheter) in place 06/02/2022   Medication management 06/02/2022   Diabetic foot infection (HCC) 06/02/2022   Smoking 06/02/2022   Tobacco abuse 05/06/2022   Hyperkalemia 05/06/2022   Obesity (BMI 30-39.9) 05/05/2022   Cellulitis  and abscess of foot    Injury of left foot    Diabetic foot ulcer (HCC) 12/14/2021    Orientation RESPIRATION BLADDER Height & Weight     Time, Self, Situation, Place  Normal Continent Weight: 249 lb 5.4 oz (113.1 kg) Height:  5' 9 (175.3 cm)  BEHAVIORAL SYMPTOMS/MOOD NEUROLOGICAL BOWEL NUTRITION STATUS      Continent Diet (Carb modified)  AMBULATORY STATUS COMMUNICATION OF NEEDS Skin   Limited Assist Verbally Surgical wounds, Wound Vac (Surgical Flap Toe Left, Wound Vac left foot)                       Personal Care Assistance Level of Assistance  Bathing, Feeding, Dressing Bathing Assistance: Limited assistance Feeding assistance: Independent Dressing Assistance: Limited assistance     Functional Limitations Info  Hearing, Sight, Speech Sight Info: Adequate Hearing Info: Adequate      SPECIAL CARE FACTORS FREQUENCY  PT (By licensed PT), OT (By licensed OT)     PT Frequency: 5x/week OT Frequency: 5x/week            Contractures      Additional Factors Info  Code Status, Allergies Code Status Info: Full Allergies Info: NKA           Current Medications (11/05/2024):  This is the current hospital active medication list Current Facility-Administered Medications  Medication Dose Route Frequency Provider Last Rate Last Admin   acetaminophen  (TYLENOL ) tablet 650 mg  650 mg Oral Q6H PRN Tanda Greig MATSU, DPM   650 mg at 11/04/24 0530   Or   acetaminophen  (TYLENOL ) suppository 650 mg  650 mg Rectal Q6H PRN Tanda, Amy G, DPM       ceFEPIme  (MAXIPIME ) 2 g in sodium chloride  0.9 % 100 mL IVPB  2 g Intravenous Q8H Wilson, Amy G, DPM 200 mL/hr at 11/05/24 0858 2 g at 11/05/24 0858   diclofenac  Sodium (VOLTAREN ) 1 % topical gel 4 g  4 g Topical QID Mansy, Jan A, MD   4 g at 11/05/24 1242   diphenhydrAMINE  (BENADRYL ) capsule 25 mg  25 mg Oral Q6H PRN Mansy, Jan A, MD   25 mg at 11/05/24 0151   insulin  aspart (novoLOG ) injection 0-15 Units  0-15 Units Subcutaneous TID  WC Tanda Greig MATSU, DPM   2 Units at 11/05/24 1239   insulin  aspart (novoLOG ) injection 0-5 Units  0-5 Units Subcutaneous QHS Wilson, Amy G, DPM       lidocaine  (PF) (XYLOCAINE ) 1 % injection 5 mL  5 mL Infiltration Once Wilson, Amy G, DPM       nicotine  (NICODERM CQ  - dosed in mg/24 hours) patch 21 mg  21 mg Transdermal Daily PRN Wilson, Amy G, DPM       ondansetron  (ZOFRAN ) tablet 4 mg  4 mg Oral Q6H PRN Tanda, Amy G, DPM       Or   ondansetron  (ZOFRAN ) injection 4 mg  4 mg Intravenous Q6H PRN Tanda, Amy G, DPM   4 mg at 11/04/24 2126   oxyCODONE -acetaminophen  (PERCOCET/ROXICET) 5-325 MG per tablet 1-2 tablet  1-2 tablet Oral Q4H PRN Tanda Greig MATSU, DPM   2 tablet at 11/05/24 0859   senna-docusate (Senokot-S) tablet 1 tablet  1 tablet Oral QHS PRN Wilson, Amy G, DPM       vancomycin  (VANCOREADY) IVPB 1500 mg/300 mL  1,500 mg Intravenous Q24H Hunt, Madison H, [facility]         Discharge Medications: Please see discharge summary for a list of discharge medications.  Relevant Imaging Results:  Relevant Lab Results:   Additional Information [id number]  Alvaro Louder, LCSW

## 2024-11-06 ENCOUNTER — Inpatient Hospital Stay: Admitting: Anesthesiology

## 2024-11-06 ENCOUNTER — Encounter
Admission: EM | Disposition: A | Discharge status: Skilled Nursing Facility | Payer: Self-pay | Source: Home / Self Care | Attending: Internal Medicine

## 2024-11-06 ENCOUNTER — Encounter: Payer: Self-pay | Admitting: Emergency Medicine

## 2024-11-06 DIAGNOSIS — L089 Local infection of the skin and subcutaneous tissue, unspecified: Secondary | ICD-10-CM | POA: Diagnosis not present

## 2024-11-06 DIAGNOSIS — T148XXA Other injury of unspecified body region, initial encounter: Secondary | ICD-10-CM | POA: Diagnosis not present

## 2024-11-06 HISTORY — PX: KNEE ARTHROSCOPY: SHX127

## 2024-11-06 LAB — BASIC METABOLIC PANEL WITH GFR
Anion gap: 8 (ref 5–15)
BUN: 20 mg/dL (ref 6–20)
CO2: 24 mmol/L (ref 22–32)
Calcium: 8.5 mg/dL — ABNORMAL LOW (ref 8.9–10.3)
Chloride: 101 mmol/L (ref 98–111)
Creatinine, Ser: 1.13 mg/dL (ref 0.61–1.24)
GFR, Estimated: >60 mL/min (ref >60)
Glucose, Bld: 138 mg/dL — ABNORMAL HIGH (ref 70–99)
Potassium: 4.6 mmol/L (ref 3.5–5.1)
Sodium: 133 mmol/L — ABNORMAL LOW (ref 135–145)

## 2024-11-06 LAB — CBC
HCT: 24.6 % — ABNORMAL LOW (ref 39.0–52.0)
Hemoglobin: 7.5 g/dL — ABNORMAL LOW (ref 13.0–17.0)
MCH: 24.2 pg — ABNORMAL LOW (ref 26.0–34.0)
MCHC: 30.5 g/dL (ref 30.0–36.0)
MCV: 79.4 fL — ABNORMAL LOW (ref 80.0–100.0)
Platelets: 364 K/uL (ref 150–400)
RBC: 3.1 MIL/uL — ABNORMAL LOW (ref 4.22–5.81)
RDW: 14.2 % (ref 11.5–15.5)
WBC: 7 K/uL (ref 4.0–10.5)
nRBC: 0 % (ref 0.0–0.2)

## 2024-11-06 LAB — GLUCOSE, CAPILLARY
Glucose-Capillary: 114 mg/dL — ABNORMAL HIGH (ref 70–99)
Glucose-Capillary: 105 mg/dL — ABNORMAL HIGH (ref 70–99)
Glucose-Capillary: 117 mg/dL — ABNORMAL HIGH (ref 70–99)
Glucose-Capillary: 169 mg/dL — ABNORMAL HIGH (ref 70–99)
Glucose-Capillary: 365 mg/dL — ABNORMAL HIGH (ref 70–99)

## 2024-11-06 LAB — MAGNESIUM: Magnesium: 2.1 mg/dL (ref 1.7–2.4)

## 2024-11-06 SURGERY — ARTHROSCOPY, KNEE
Anesthesia: General | Site: Knee | Laterality: Right

## 2024-11-06 MED ORDER — SODIUM CHLORIDE 0.9 % IV SOLN: INTRAVENOUS | Status: DC | PRN

## 2024-11-06 MED ORDER — PROPOFOL 10 MG/ML IV BOLUS
INTRAVENOUS | Status: DC | PRN
  Administered 2024-11-06: 100 mg via INTRAVENOUS

## 2024-11-06 MED ORDER — MIDAZOLAM HCL (PF) 2 MG/2ML IJ SOLN
INTRAMUSCULAR | Status: DC | PRN
  Administered 2024-11-06: 2 mg via INTRAVENOUS

## 2024-11-06 MED ORDER — DEXAMETHASONE SOD PHOSPHATE PF 10 MG/ML IJ SOLN
INTRAMUSCULAR | Status: DC | PRN
  Administered 2024-11-06: 5 mg via INTRAVENOUS

## 2024-11-06 MED ORDER — FENTANYL CITRATE (PF) 100 MCG/2ML IJ SOLN
INTRAMUSCULAR | Status: DC | PRN
  Administered 2024-11-06 (×2): 50 ug via INTRAVENOUS

## 2024-11-06 MED ORDER — LIDOCAINE HCL (PF) 2 % IJ SOLN
INTRAMUSCULAR | Status: AC
  Filled 2024-11-06: qty 5

## 2024-11-06 MED ORDER — FENTANYL CITRATE (PF) 100 MCG/2ML IJ SOLN
INTRAMUSCULAR | Status: AC
  Filled 2024-11-06: qty 2

## 2024-11-06 MED ORDER — PHENYLEPHRINE 80 MCG/ML (10ML) SYRINGE FOR IV PUSH (FOR BLOOD PRESSURE SUPPORT)
PREFILLED_SYRINGE | INTRAVENOUS | Status: AC
  Filled 2024-11-06: qty 10

## 2024-11-06 MED ORDER — LACTATED RINGERS IR SOLN
Status: DC | PRN
  Administered 2024-11-06: 6000 mL

## 2024-11-06 MED ORDER — HYDROMORPHONE HCL 1 MG/ML IJ SOLN
INTRAMUSCULAR | Status: AC
  Filled 2024-11-06: qty 1

## 2024-11-06 MED ORDER — PROPOFOL 10 MG/ML IV BOLUS
INTRAVENOUS | Status: AC
  Filled 2024-11-06: qty 20

## 2024-11-06 MED ORDER — ONDANSETRON HCL 4 MG/2ML IJ SOLN
INTRAMUSCULAR | Status: DC | PRN
  Administered 2024-11-06: 4 mg via INTRAVENOUS

## 2024-11-06 MED ORDER — HYDROMORPHONE HCL 1 MG/ML IJ SOLN
INTRAMUSCULAR | Status: DC | PRN
  Administered 2024-11-06: 1 mg via INTRAVENOUS

## 2024-11-06 MED ORDER — ACETAMINOPHEN 10 MG/ML IV SOLN
INTRAVENOUS | Status: AC
  Filled 2024-11-06: qty 100

## 2024-11-06 MED ORDER — ACETAMINOPHEN 10 MG/ML IV SOLN
INTRAVENOUS | Status: DC | PRN
  Administered 2024-11-06: 1000 mg via INTRAVENOUS

## 2024-11-06 MED ORDER — OXYCODONE HCL 5 MG PO TABS
5.0000 mg | ORAL_TABLET | Freq: Once | ORAL | Status: AC | PRN
  Administered 2024-11-06: 5 mg via ORAL

## 2024-11-06 MED ORDER — FENTANYL CITRATE (PF) 100 MCG/2ML IJ SOLN
25.0000 ug | INTRAMUSCULAR | Status: DC | PRN
  Administered 2024-11-06 (×2): 50 ug via INTRAVENOUS

## 2024-11-06 MED ORDER — OXYCODONE HCL 5 MG PO TABS
ORAL_TABLET | ORAL | Status: AC
  Filled 2024-11-06: qty 1

## 2024-11-06 MED ORDER — OXYCODONE HCL 5 MG/5ML PO SOLN: 5.0000 mg | Freq: Once | ORAL | Status: AC | PRN

## 2024-11-06 MED ORDER — LIDOCAINE HCL (CARDIAC) PF 100 MG/5ML IV SOSY
PREFILLED_SYRINGE | INTRAVENOUS | Status: DC | PRN
  Administered 2024-11-06: 100 mg via INTRAVENOUS

## 2024-11-06 MED ORDER — VANCOMYCIN HCL 750 MG/150ML IV SOLN
750.0000 mg | Freq: Two times a day (BID) | INTRAVENOUS | Status: DC
  Administered 2024-11-06 – 2024-11-07 (×3): 750 mg via INTRAVENOUS
  Filled 2024-11-06 (×5): qty 150

## 2024-11-06 MED ORDER — ONDANSETRON HCL 4 MG/2ML IJ SOLN
INTRAMUSCULAR | Status: AC
  Filled 2024-11-06: qty 2

## 2024-11-06 MED ORDER — PHENYLEPHRINE 80 MCG/ML (10ML) SYRINGE FOR IV PUSH (FOR BLOOD PRESSURE SUPPORT)
PREFILLED_SYRINGE | INTRAVENOUS | Status: DC | PRN
  Administered 2024-11-06 (×3): 80 ug via INTRAVENOUS

## 2024-11-06 MED ORDER — MIDAZOLAM HCL 2 MG/2ML IJ SOLN
INTRAMUSCULAR | Status: AC
  Filled 2024-11-06: qty 2

## 2024-11-06 SURGICAL SUPPLY — 44 items
ADAPTER IRRIG TUBE 2 SPIKE SOL (ADAPTER) ×2 IMPLANT
BLADE SAW SGTL 13X75X1.27 (BLADE) ×1 IMPLANT
BLADE SHAVER 4.5X7 STR FR (MISCELLANEOUS) IMPLANT
BLADE SURG 15 STRL LF DISP TIS (BLADE) ×1 IMPLANT
BLADE SURG SZ11 CARB STEEL (BLADE) ×1 IMPLANT
BNDG ESMARCH 6X12 STRL LF (GAUZE/BANDAGES/DRESSINGS) IMPLANT
BUR BR 5.5 WIDE MOUTH (BURR) IMPLANT
CHLORAPREP W/TINT 26 (MISCELLANEOUS) ×1 IMPLANT
CNTNR URN SCR LID CUP LEK RST (MISCELLANEOUS) IMPLANT
COOLER ICEMAN CLASSIC (MISCELLANEOUS) ×1 IMPLANT
CUFF TRNQT CYL 24X4X16.5-23 (TOURNIQUET CUFF) IMPLANT
CUFF TRNQT CYL 34X4.125X (TOURNIQUET CUFF) IMPLANT
DRAPE ARTHROSCOPY W/POUCH 90 (DRAPES) ×1 IMPLANT
DRAPE C-ARM XRAY 36X54 (DRAPES) ×1 IMPLANT
DRAPE C-ARMOR (DRAPES) ×1 IMPLANT
DRAPE IMP U-DRAPE 54X76 (DRAPES) ×1 IMPLANT
DRAPE INCISE IOBAN 66X45 STRL (DRAPES) IMPLANT
EVACUATOR 1/8 PVC DRAIN (DRAIN) IMPLANT
GAUZE SPONGE 4X4 12PLY STRL (GAUZE/BANDAGES/DRESSINGS) ×1 IMPLANT
GAUZE XEROFORM 1X8 LF (GAUZE/BANDAGES/DRESSINGS) IMPLANT
GLOVE BIOGEL PI IND STRL 8 (GLOVE) ×2 IMPLANT
GOWN STRL REUS W/ TWL LRG LVL3 (GOWN DISPOSABLE) ×1 IMPLANT
GRADUATE 1200CC STRL 31836 (MISCELLANEOUS) IMPLANT
IV LR IRRIG 3000ML ARTHROMATIC (IV SOLUTION) ×4 IMPLANT
KIT TURNOVER KIT A (KITS) ×1 IMPLANT
MANIFOLD NEPTUNE II (INSTRUMENTS) ×2 IMPLANT
MAT ABSORB FLUID 56X50 GRAY (MISCELLANEOUS) ×1 IMPLANT
NDL HYPO 22X1.5 SAFETY MO (MISCELLANEOUS) ×1 IMPLANT
NDL SUT 5 .5 CRC TPR PNT MAYO (NEEDLE) ×1 IMPLANT
NEEDLE HYPO 22X1.5 SAFETY MO (MISCELLANEOUS) ×1 IMPLANT
PACK ARTHROSCOPY KNEE (MISCELLANEOUS) ×1 IMPLANT
PAD ABD DERMACEA PRESS 5X9 (GAUZE/BANDAGES/DRESSINGS) ×2 IMPLANT
PAD COLD UNI WRAP-ON (PAD) ×1 IMPLANT
SOLN STERILE WATER 500 ML (IV SOLUTION) ×1 IMPLANT
SPONGE T-LAP 18X18 ~~LOC~~+RFID (SPONGE) ×1 IMPLANT
STRIP CLOSURE SKIN 1/2X4 (GAUZE/BANDAGES/DRESSINGS) IMPLANT
SUT BONE WAX W31G (SUTURE) ×1 IMPLANT
SUT MNCRL AB 4-0 PS2 18 (SUTURE) ×1 IMPLANT
SUT VIC AB 2-0 CT1 (SUTURE) ×1 IMPLANT
SUTURE EHLN 3-0 FS-10 30 BLK (SUTURE) ×1 IMPLANT
TRAP FLUID SMOKE EVACUATOR (MISCELLANEOUS) ×1 IMPLANT
TUBE SET DOUBLEFLO INFLOW (TUBING) ×1 IMPLANT
TUBE SET DOUBLEFLO OUTFLOW (TUBING) ×1 IMPLANT
WAND WEREWOLF FLOW 90D (MISCELLANEOUS) ×1 IMPLANT

## 2024-11-06 NOTE — Progress Notes (Signed)
 PT Cancellation Note  Patient Details Name: [name] MRN: [id number] DOB: 08-02-1966   Cancelled Treatment:    Reason Eval/Treat Not Completed: Patient at procedure or test/unavailable Pt out of room for knee scope this afternoon.   Carmin JONELLE Deed, DPT 11/06/2024, 3:31 PM

## 2024-11-06 NOTE — Progress Notes (Signed)
 PROGRESS NOTE  [name] FMW:[id number] DOB: 1966/02/27 DOA: 11/03/2024 PCP: [name], MD   LOS: 3 days   Brief narrative:  [name] is a 58 year old male with history of insulin -dependent diabetes mellitus type 2, with diabetic foot ulcer, CKD 3A, hyperlipidemia, hypertension, cocaine abuse, presented to hospital with left foot wounds and right knee pain for several weeks with drainage which got worse for about a day.  Of note [name] was recently admitted to the hospital for left foot infection concerning for osteomyelitis with history of left TMA on 10/17/2024.  Subsequently wound dehiscence was present.  Podiatry was consulted and [name] underwent TMA with debridement on 11/04/2024.  On cefepime  and vancomycin .  During hospitalization [name] also complained of right knee pain so orthopedics was consulted.  Joint aspiration showed possibility for knee joint infection so orthopedics has recommended surgical intervention.  Physical therapy has seen the [name] and plan for skilled nursing facility placement.   Assessment/Plan: Principal Problem:   Wound infection Active Problems:   Diabetic foot ulcer (HCC)   Tobacco abuse   Obesity (BMI 30-39.9)   Right knee pain  Chronic osteomyelitis of the left foot.   Diabetic foot ulcer.    Status post irrigation and debridement of the left foot wound with wound VAC placement on 11/04/2024 by podiatry.  Follow-up podiatry further recommendations.  Continue vancomycin  and cefepime .  Follow culture of the debrided sample showing moderate gram-positive cocci and gram-negative rods.  Temperature max of 99.2 F.  No leukocytosis present.  Diabetes mellitus type 2.  Continue sliding scale insulin  Accu-Cheks diabetic diet.  Closely monitor blood glucose levels.  Class II obesity.  Body mass index is 36.82 kg/m.  [name] would benefit from ongoing weight loss as outpatient.  Right knee pain with joint effusion..  Likely septic arthritis.   States that he did have right knee swelling for a month or so and had aspiration in the past without any infection states that he did have some inflammation but has not been improving and states that is limiting his mobility especially in the context of left foot surgery.  MRI of the right knee showed large knee joint effusion with synovial enhancement with grade 3 oblique tear of the posterior horn of the medial meniscus.  Orthopedics on board and underwent aspiration of the knee with findings of septic arthritis.  Plan for irrigation and debridement today  Deconditioning debility.  PT has recommended skilled nursing facility placement.  DVT prophylaxis: Will resume Lovenox  when okay with orthopedics.   Disposition: Physical therapy recommending skilled nursing facility placement.  Status is: Inpatient Remains inpatient appropriate because: Status post irrigation and debridement of the left foot,, pending clinical improvement, plan for right knee irrigation and debridement  Code Status:     Code Status: Full Code  Family Communication: None at bedside  Consultants: Podiatry Orthopedics  Procedures: Irrigation and debridement of the left foot wounds with wound VAC placement.  Anti-infectives:  Vancomycin  and cefepime  IV  Anti-infectives (From admission, onward)    Start     Dose/Rate Route Frequency Ordered Stop   11/06/24 0900  vancomycin  (VANCOREADY) IVPB 750 mg/150 mL        750 mg 150 mL/hr over 60 Minutes Intravenous Every 12 hours 11/06/24 0743     11/05/24 1500  vancomycin  (VANCOREADY) IVPB 1500 mg/300 mL  Status:  Discontinued        1,500 mg 150 mL/hr over 120 Minutes Intravenous Every 24 hours 11/05/24 1023 11/06/24 0743  11/04/24 1500  ceFEPIme  (MAXIPIME ) 2 g in sodium chloride  0.9 % 100 mL IVPB        2 g 200 mL/hr over 30 Minutes Intravenous Every 8 hours 11/04/24 1405     11/04/24 1500  vancomycin  (VANCOCIN ) IVPB 1000 mg/200 mL premix       Placed in And  Linked Group   1,000 mg 200 mL/hr over 60 Minutes Intravenous  Once 11/04/24 1405 11/04/24 1658   11/04/24 1500  vancomycin  (VANCOREADY) IVPB 1500 mg/300 mL       Placed in And Linked Group   1,500 mg 150 mL/hr over 120 Minutes Intravenous  Once 11/04/24 1405 11/04/24 1835   11/04/24 1418  vancomycin  variable dose per unstable renal function (pharmacist dosing)  Status:  Discontinued         Does not apply See admin instructions 11/04/24 1418 11/05/24 1023      Subjective: Today, [name] was seen and examined at bedside.  [name] still complains of right knee pain swelling and tenderness with restriction of mobility.  Denies any fever chills nausea vomiting.  Currently n.p.o. waiting for knee washout  Objective: Vitals:   11/06/24 0518 11/06/24 0806  BP: 118/72 124/72  Pulse: 79 74  Resp: 16 18  Temp: 98.5 F (36.9 C) 97.8 F (36.6 C)  SpO2: 98% 100%    Intake/Output Summary (Last 24 hours) at 11/06/2024 0922 Last data filed at 11/06/2024 0305 Gross per 24 hour  Intake 1300.1 ml  Output 350 ml  Net 950.1 ml   Filed Weights   11/03/24 1615 11/03/24 2026  Weight: 102.1 kg 113.1 kg   Body mass index is 36.82 kg/m.   Physical Exam: GENERAL: [name] is alert awake and oriented. Not in obvious distress.  Obese built HENT: No scleral pallor or icterus. Pupils equally reactive to light. Oral mucosa is moist NECK: is supple, no gross swelling noted. CHEST: Clear to auscultation. No crackles or wheezes.   CVS: S1 and S2 heard, no murmur. Regular rate and rhythm.  ABDOMEN: Soft, non-tender, bowel sounds are present. EXTREMITIES: Right knee with gross effusion and tenderness on palpation.  Left foot with dressing and wound VAC. CNS: Cranial nerves are intact. No focal motor deficits. SKIN: warm and left foot with dressing and wound VAC  Data Review: I have personally reviewed the following laboratory data and studies,  CBC: Recent Labs  Lab 11/03/24 1621 11/05/24 0546  11/06/24 0524  WBC 10.9* 7.4 7.0  HGB 8.3* 7.5* 7.5*  HCT 27.5* 24.4* 24.6*  MCV 79.5* 78.7* 79.4*  PLT 426* 352 364   Basic Metabolic Panel: Recent Labs  Lab 11/03/24 1621 11/05/24 0546 11/06/24 0524  NA 133* 135 133*  K 4.8 4.6 4.6  CL 99 101 101  CO2 23 25 24   GLUCOSE 84 149* 138*  BUN 22* 21* 20  CREATININE 1.51* 1.35* 1.13  CALCIUM  8.8* 8.4* 8.5*  MG  --   --  2.1   Liver Function Tests: No results for input(s): AST, ALT, ALKPHOS, BILITOT, PROT, ALBUMIN in the last 168 hours. No results for input(s): LIPASE, AMYLASE in the last 168 hours. No results for input(s): AMMONIA in the last 168 hours. Cardiac Enzymes: No results for input(s): CKTOTAL, CKMB, CKMBINDEX, TROPONINI in the last 168 hours. BNP (last 3 results) No results for input(s): BNP in the last 8760 hours.  ProBNP (last 3 results) No results for input(s): PROBNP in the last 8760 hours.  CBG: Recent Labs  Lab 11/05/24 [phone]  11/05/24 1151 11/05/24 1638 11/05/24 2011 11/06/24 0807  GLUCAP 137* 150* 162* 114* 114*   Recent Results (from the past 240 hours)  Aerobic/Anaerobic Culture w Gram Stain (surgical/deep wound)     Status: None (Preliminary result)   Collection Time: 11/03/24  7:56 PM   Specimen: Wound  Result Value Ref Range Status   Specimen Description   Final    WOUND Performed at [facility], [address] [place], [place] [address]    Special Requests   Final    NONE Performed at [facility], [address] [place], [place] [address]    Gram Stain   Final    NO WBC SEEN MODERATE GRAM POSITIVE COCCI RARE GRAM NEGATIVE RODS RARE GRAM POSITIVE RODS    Culture   Final    MODERATE ENTEROBACTER HORMAECHEI SUSCEPTIBILITIES TO FOLLOW CULTURE REINCUBATED FOR BETTER GROWTH MODERATE MORGANELLA MORGANII SUBCULTURING FOR SUSCEPTIBILITIES Performed at [facility], [address] [place], [place] [address]    Report Status  PENDING  Incomplete  MRSA Next Gen by PCR, Nasal     Status: None   Collection Time: 11/04/24  1:21 AM   Specimen: Nasal Mucosa; Nasal Swab  Result Value Ref Range Status   MRSA by PCR Next Gen NOT DETECTED NOT DETECTED Final    Comment: (NOTE) The GeneXpert MRSA Assay (FDA approved for NASAL specimens only), is one component of a comprehensive MRSA colonization surveillance program. It is not intended to diagnose MRSA infection nor to guide or monitor treatment for MRSA infections. Test performance is not FDA approved in patients less than 79 years old. Performed at [facility], [address] [place], [place] [address]      Studies: MR KNEE RIGHT W WO CONTRAST Result Date: 11/05/2024 EXAM: MRI of the right Knee With and Without Contrast. 11/05/2024 01:32:47 AM TECHNIQUE: Multiplanar multisequence MRI of the right knee was performed with and without intravenous contrast. CONTRAST: 10 mL of Gadavist . COMPARISON: MR Knee 09/28/2024. CLINICAL HISTORY: Chronic knee pain and swelling. FINDINGS: MEDIAL MENISCUS: Small grade 3 oblique tear of the posterior horn medial meniscus extends to the inferior meniscal surface as on images 22 through 25 series 12. LATERAL MENISCUS: Intact lateral meniscus. ANTERIOR CRUCIATE LIGAMENT: Intact anterior cruciate ligament. POSTERIOR CRUCIATE LIGAMENT: Intact posterior cruciate ligament. EXTENSOR MECHANISM: Intact quadriceps and patellar tendons. Intact patellar retinacula. LATERAL COLLATERAL LIGAMENT COMPLEX: Intact IT band, lateral collateral ligament proper, biceps femoris tendon and popliteus tendon. MEDIAL COLLATERAL LIGAMENT COMPLEX: The superficial and deep components of the medial collateral ligament are intact. KNEE JOINT: Mild degenerative chondral thinning in the medial compartment. Large knee joint effusion with relatively thin synovial enhancement. The effusion has increased in volume compared to the 09/28/2024 examination. Large amount of  fluid in the suprapatellar bursa. Fluid extends down in the popliteus recess. Small baker cyst. BONE MARROW: Progressive marrow edema along the tibial spine, etiology uncertain. Mild progressive marrow edema below the medial patellar facet. SOFT TISSUE: Infiltrative edema in the popliteal space. Enlarged lymph nodes in the upper popliteal region and along the distal femoral artery up to 1.2 cm in short axis on image 17 series 12, probably reactive lymph nodes. Edema signal in the popliteus muscle, cannot exclude muscle strain or tear. Mild edema along the distal vastus medialis muscle posteriorly. Prepatellar and anterior subcutaneous edema along the knee. IMPRESSION: 1. Large knee joint effusion with thin synovial enhancement, increased in volume compared to the prior examination, with large suprapatellar bursal  fluid and fluid extending into the popliteus recess and a small Baker cyst. Given the progressive volume, likely reactive adenopathy, new marrow edema along the tibial spine, septic arthritis is not entirely excluded and arthrocentesis may be indicated. 2. Small grade 3 oblique tear of the posterior horn medial meniscus extending to the inferior meniscal surface. 3. Mild degenerative chondral thinning in the medial compartment. 4. Edema signal in the popliteus muscle, cannot exclude strain or tear, with mild edema along the distal vastus medialis muscle posteriorly. 5. Progressive marrow edema along the tibial spine, etiology uncertain, with mild progressive marrow edema below the medial patellar facet. 6. Enlarged lymph nodes in the upper popliteal region and along the distal femoral artery, likely reactive. Electronically signed by: Ryan Salvage MD 11/05/2024 09:41 AM EST RP Workstation: [id number]      Vernal Alstrom, MD  [facility] 11/06/2024  If 7PM-7AM, please contact night-coverage

## 2024-11-06 NOTE — Plan of Care (Signed)

## 2024-11-06 NOTE — Anesthesia Procedure Notes (Signed)
 Procedure Name: LMA Insertion Date/Time: 11/06/2024 3:36 PM  Performed by: Jackye Spanner, CRNAPre-anesthesia Checklist: Patient identified, Patient being monitored, Timeout performed, Emergency Drugs available and Suction available Patient Re-evaluated:Patient Re-evaluated prior to induction Oxygen  Delivery Method: Circle system utilized Preoxygenation: Pre-oxygenation with 100% oxygen  Induction Type: IV induction Ventilation: Mask ventilation without difficulty LMA: LMA inserted LMA Size: 5.0 Tube type: Oral Number of attempts: 1 Placement Confirmation: positive ETCO2 and breath sounds checked- equal and bilateral Tube secured with: Tape Dental Injury: Teeth and Oropharynx as per pre-operative assessment  Comments: Smooth atraumatic LMA placement, no complications noted.

## 2024-11-06 NOTE — Plan of Care (Signed)
  Problem: Skin Integrity: Goal: Risk for impaired skin integrity will decrease Outcome: Progressing   Problem: Safety: Goal: Ability to remain free from injury will improve Outcome: Progressing   Problem: Pain Managment: Goal: General experience of comfort will improve and/or be controlled Outcome: Progressing   Problem: Elimination: Goal: Will not experience complications related to bowel motility Outcome: Progressing   Problem: Nutrition: Goal: Adequate nutrition will be maintained Outcome: Progressing

## 2024-11-06 NOTE — Transfer of Care (Signed)
 Immediate Anesthesia Transfer of Care Note  Patient: [name]  Procedure(s) Performed: ARTHROSCOPY, KNEE (Right: Knee)  Patient Location: PACU  Anesthesia Type:General  Level of Consciousness: awake and drowsy  Airway & Oxygen  Therapy: Patient Spontanous Breathing and Patient connected to face mask oxygen   Post-op Assessment: Report given to RN and Post -op Vital signs reviewed and stable  Post vital signs: Reviewed and stable  Last Vitals:  Vitals Value Taken Time  BP 146/89 1652  Temp 36.9 1652  Pulse 88 11/06/24 16:53  Resp 16 11/06/24 16:53  SpO2 100 % 11/06/24 16:53  Vitals shown include unfiled device data.  Last Pain:  Vitals:   11/06/24 1418  TempSrc: Temporal  PainSc: 9       Patients Stated Pain Goal: 2 (11/06/24 0656)  Complications: No notable events documented.

## 2024-11-06 NOTE — Anesthesia Preprocedure Evaluation (Signed)
 Anesthesia Evaluation  Patient identified by MRN, date of birth, ID band Patient awake    Reviewed: Allergy & Precautions, NPO status , Patient's Chart, lab work & pertinent test results  History of Anesthesia Complications Negative for: history of anesthetic complications  Airway Mallampati: III  TM Distance: >3 FB Neck ROM: full    Dental  (+) Chipped, Poor Dentition, Missing   Pulmonary neg shortness of breath, former smoker   Pulmonary exam normal        Cardiovascular hypertension, (-) angina Normal cardiovascular exam     Neuro/Psych negative neurological ROS  negative psych ROS   GI/Hepatic negative GI ROS, Neg liver ROS,,,  Endo/Other  negative endocrine ROSdiabetes    Renal/GU Renal disease     Musculoskeletal   Abdominal   Peds  Hematology negative hematology ROS (+)   Anesthesia Other Findings Past Medical History: No date: Amputation of right great toe No date: Cellulitis and abscess of foot No date: CKD stage 3a, GFR 45-59 ml/min (HCC) No date: Cocaine abuse (HCC)     Comment:  + UDS on 05-12-24 No date: DM (diabetes mellitus), type 2 (HCC) 04/18/2024: ESBL (extended spectrum beta-lactamase) producing  bacteria infection No date: Hyperlipidemia No date: Hypertension 07/14/2024: Methicillin resistant Staphylococcus aureus infection No date: MRSA bacteremia No date: Normocytic anemia No date: Obesity No date: Tobacco abuse No date: Toe osteomyelitis, left (HCC) No date: Tuberculosis     Comment:  tested positive,treated with medications.  Past Surgical History: 03/26/2024: AMPUTATION TOE; Right     Comment:  Procedure: AMPUTATION, TOE;  Surgeon: Harden Jerona GAILS,               MD;  Location: [facility] OR;  Service: Orthopedics;  Laterality:              Right;  RIGHT GREAT TOE AMPUTATION 11/04/2024: APPLICATION OF WOUND VAC; Left     Comment:  Procedure: APPLICATION, WOUND VAC;  Surgeon: Tanda Greig MATSU, DPM;  Location: [facility] ORS;  Service: Podiatry;                Laterality: Left;  wound vac available intraop please. 04/18/2024: BONE BIOPSY     Comment:  Procedure: BIOPSY, BONE (2ND METATARSAL);  Surgeon:               Ashley Soulier, DPM;  Location: [facility];  Service:               Orthopedics/Podiatry;; No date: FOOT SURGERY; Left 05/05/2022: I & D EXTREMITY; Left     Comment:  Procedure: LEFT FOOT DEBRIDEMENT;  Surgeon: Harden Jerona GAILS, MD;  Location: [facility] OR;  Service: Orthopedics;                Laterality: Left; 06/05/2024: INCISION AND DRAINAGE OF WOUND; Right     Comment:  Procedure: DELAYED PRIMARY CLOSURE OF RIGHT FOOT WOUND,               EXCISION OF DISTAL METATARSAL 2, 3, 4, and 5;  Surgeon:               Ashley Soulier, DPM;  Location: [facility] ORS;  Service:               Orthopedics/Podiatry;  Laterality: Right; 11/04/2024: INCISION AND DRAINAGE OF WOUND; Left     Comment:  Procedure: IRRIGATION AND DEBRIDEMENT WOUND;  Surgeon:               Tanda Greig MATSU, DPM;  Location: [facility];  Service:               Podiatry;  Laterality: Left; 04/18/2024: IRRIGATION AND DEBRIDEMENT FOOT; Right     Comment:  Procedure: IRRIGATION AND DEBRIDEMENT FOOT, PLACEMENT OF              ANTIBIOTIC BEADS;  Surgeon: Ashley Soulier, DPM;                Location: [facility];  Service: Orthopedics/Podiatry;                Laterality: Right; 06/29/2024: IRRIGATION AND DEBRIDEMENT FOOT; Left     Comment:  Procedure: IRRIGATION AND DEBRIDEMENT FOOT;  Surgeon:               Lennie Barter, DPM;  Location: [facility];  Service:               Orthopedics/Podiatry;  Laterality: Left; 07/20/2022: MYRINGOTOMY WITH TUBE PLACEMENT; Bilateral     Comment:  Procedure: MYRINGOTOMY WITH TUBE PLACEMENT;  Surgeon:               Juengel, Paul, MD;  Location: [facility];                Service: ENT;  Laterality: Bilateral;  Diabetic 07/02/2024: TEE WITHOUT CARDIOVERSION; N/A     Comment:   Procedure: ECHOCARDIOGRAM, TRANSESOPHAGEAL;  Surgeon:               Perla Evalene PARAS, MD;  Location: [facility];  Service:               Cardiovascular;  Laterality: N/A; No date: TONSILLECTOMY 04/23/2024: TRANSMETATARSAL AMPUTATION; Right     Comment:  Procedure: AMPUTATION, FOOT, TRANSMETATARSAL;  Surgeon:               Ashley Soulier, DPM;  Location: [facility];  Service:               Orthopedics/Podiatry;  Laterality: Right; 06/29/2024: TRANSMETATARSAL AMPUTATION; Right     Comment:  Procedure: AMPUTATION, FOOT, TRANSMETATARSAL;  Surgeon:               Lennie Barter, DPM;  Location: [facility];  Service:               Orthopedics/Podiatry;  Laterality: Right;  RIGHT               TRANSMETATARSAL AMPUTATION REVISION 10/17/2024: TRANSMETATARSAL AMPUTATION; Left     Comment:  Procedure: AMPUTATION, FOOT, TRANSMETATARSAL;  Surgeon:               Tanda Greig MATSU, DPM;  Location: [facility];  Service:               Podiatry;  Laterality: Left;  pulse lavage available  BMI    Body Mass Index: 36.82 kg/m      Reproductive/Obstetrics negative OB ROS                              Anesthesia Physical Anesthesia Plan  ASA: 3  Anesthesia Plan: General LMA   Post-op Pain Management:    Induction: Intravenous  PONV Risk Score and Plan: Dexamethasone , Ondansetron , Midazolam  and Treatment may vary due to age or medical condition  Airway Management Planned: LMA  Additional Equipment:   Intra-op Plan:   Post-operative Plan: Extubation in OR  Informed Consent: I have reviewed the patients History and Physical, chart, labs and discussed the procedure including the risks, benefits and alternatives for the proposed anesthesia with the patient or authorized representative who has indicated his/her understanding and acceptance.     Dental Advisory Given  Plan Discussed with: Anesthesiologist, CRNA and Surgeon  Anesthesia Plan Comments: (Patient consented for risks of  anesthesia including but not limited to:  - adverse reactions to medications - damage to eyes, teeth, lips or other oral mucosa - nerve damage due to positioning  - sore throat or hoarseness - Damage to heart, brain, nerves, lungs, other parts of body or loss of life  Patient voiced understanding and assent.)        Anesthesia Quick Evaluation

## 2024-11-06 NOTE — Anesthesia Postprocedure Evaluation (Signed)
 Anesthesia Post Note  Patient: [name]  Procedure(s) Performed: ARTHROSCOPY, KNEE (Right: Knee)  Patient location during evaluation: PACU Anesthesia Type: General Level of consciousness: awake and alert Pain management: pain level controlled Vital Signs Assessment: post-procedure vital signs reviewed and stable Respiratory status: spontaneous breathing, nonlabored ventilation, respiratory function stable and patient connected to nasal cannula oxygen  Cardiovascular status: blood pressure returned to baseline and stable Postop Assessment: no apparent nausea or vomiting Anesthetic complications: no   No notable events documented.   Last Vitals:  Vitals:   11/06/24 1800 11/06/24 1817  BP: (!) 150/85 137/84  Pulse: 84 82  Resp: 15 17  Temp: (!) 36.3 C 36.5 C  SpO2: 100% 100%    Last Pain:  Vitals:   11/06/24 1745  TempSrc:   PainSc: 5                  Debby Mines

## 2024-11-06 NOTE — TOC Progression Note (Signed)
 Transition of Care [facility]) - Progression Note    Patient Details  Name: [name] MRN: [id number] Date of Birth: Oct 08, 1966  Transition of [facility] CM/SW Contact  Alvaro Louder, [place] Phone Number: 11/06/2024, 12:20 PM  Clinical Narrative:     Shara started at [facility] for patient to admit when medically ready.                     Expected Discharge Plan and Services                                               Social Drivers of Health (SDOH) Interventions SDOH Screenings   Food Insecurity: No Food Insecurity (11/03/2024)  Housing: Low Risk  (11/03/2024)  Transportation Needs: No Transportation Needs (11/03/2024)  Utilities: Not At Risk (11/03/2024)  Depression (PHQ2-9): Low Risk  (08/28/2023)  Financial Resource Strain: Patient Declined (06/28/2023)   Received from [facility]  Social Connections: Moderately Integrated (11/03/2024)  Tobacco Use: Medium Risk (11/03/2024)  Health Literacy: Adequate Health Literacy (05/01/2024)    Readmission Risk Interventions    11/04/2024    4:01 PM  Readmission Risk Prevention Plan  Transportation Screening Complete  Medication Review (RN Care Manager) Complete  PCP or Specialist appointment within 3-5 days of discharge Complete  HRI or Home Care Consult Not Complete  HRI or Home Care Consult Pt Refusal Comments Going to SNF  SW Recovery Care/Counseling Consult Complete  Palliative Care Screening Not Applicable  Skilled Nursing Facility Complete

## 2024-11-06 NOTE — H&P (Signed)
 H&P reviewed. See consult note from yesterday for update.

## 2024-11-06 NOTE — Progress Notes (Signed)
 Pharmacy Antibiotic Note  [name] is a 58 y.o. male admitted on 11/03/2024 with osteomyelitis.  Pharmacy has been consulted for vancomycin  and cefepime  dosing. Scr is back to baseline.   Plan: Adjust to vancomycin  750 mg IV Q12H. Goal AUC 400-550. Expected AUC:412.8 Expected Css min: 12.2 SCr used: 1.13 Weight used: IBW, Vd used: 0.5 (BMI 36.82) Continue cefepime  2 g IV Q8H Continue to monitor renal function and follow culture results Potential to discontinue vancomycin  based on current culture reports; however there reports are not yet finalized    Height: 5' 9 (175.3 cm) Weight: 113.1 kg (249 lb 5.4 oz) IBW/kg (Calculated) : 70.7  Temp (24hrs), Avg:98.6 F (37 C), Min:98.3 F (36.8 C), Max:98.9 F (37.2 C)  Recent Labs  Lab 11/03/24 1621 11/05/24 0546 11/06/24 0524  WBC 10.9* 7.4 7.0  CREATININE 1.51* 1.35* 1.13    Estimated Creatinine Clearance: 89.5 mL/min (by C-G formula based on SCr of 1.13 mg/dL).    No Known Allergies  Antimicrobials this admission: 12/2 Vanc >>  12/2 Cefepime  >>   Microbiology results: 12/2 Wound cx: Moderate GPC, rare GNR, rare GPRs--> Moderate Enterobacter hormaechei and moderate Morganella morganii, re-intubated for better growth/pending susceptibilities  12/2 MRSA PCR: Negative 12/3 R knee Synovial fluid cx: IP  Thank you for allowing pharmacy to be a part of this patient's care.  Lum VEAR Mania, PharmD, BCPS 11/06/2024 7:40 AM

## 2024-11-06 NOTE — Op Note (Signed)
 Operative Note    SURGERY DATE: 11/06/2024   PRE-OP DIAGNOSIS:  1. Right knee septic arthritis   POST-OP DIAGNOSIS:  1. Right knee septic arthritis   PROCEDURES:  1. Right knee arthroscopic irrigation and debridement/lavage and drainage 2. Right knee complete synovectomy   SURGEON: Earnestine HILARIO Blanch, MD   ANESTHESIA: Gen   ESTIMATED BLOOD LOSS: minimal   TOTAL IV FLUIDS: per anesthesia   INDICATION(S):  [name] is a 58 y.o. male with acute onset of severe knee pain and swelling after admission. Joint aspiration showed >80000 WBCs with 98% PMNs and negative crystals. Clinical exam was also concerning for septic arthritis. After discussion of risks, benefits, and alternatives to surgery, the patient elected to proceed.    OPERATIVE FINDINGS:    Examination under anesthesia: A careful examination under anesthesia was performed.  Passive range of motion was: Hyperextension: none.  Extension: 0.  Flexion: 90  Intra-operative findings: A thorough arthroscopic examination of the knee was performed.  The findings are: 1. Suprapatellar pouch: Synovitis 2. Undersurface of median ridge: Grade 1 degenerative changes 3. Medial patellar facet: Grade 1 degenerative changes 4. Lateral patellar facet: Grade 1 degenerative changes 5. Trochlea: Grade 1 degenerative changes 6. Lateral gutter/popliteus tendon: synovitis 7. Hoffa's fat pad: Inflamed 8. Medial gutter/plica: synovitis 9. ACL: Normal 10. PCL: Normal 11. Medial meniscus: normal 12. Medial compartment cartilage: Areas of grade 1 fissuring to MFC and tibial plateau 13. Lateral meniscus: tear of the lateral meniscus root 14. Lateral compartment cartilage: Normal LFC, grade 2 changes to tibial plateau adjacent to tibial spine   OPERATIVE REPORT:     I identified [name] in the pre-operative holding area. I marked the operative knee with my initials. I reviewed the risks and benefits of the proposed surgical intervention and the  patient (and/or patient's guardian) wished to proceed. The patient was transferred to the operative suite and placed in the supine position with all bony prominences padded.  Anesthesia was administered. Patient was already on broad spectrum IV Antibiotics. The extremity was then prepped and draped in standard fashion. A time out was performed confirming the correct extremity, correct patient, and correct procedure.   Arthroscopy portals were marked. The anterolateral portal was established with an 11 blade. The arthroscope was placed in the anterolateral portal and then into the suprapatellar pouch.  Culture samples were taken of the fluid egress through the arthroscope sheath once the arthroscope trocar was removed. ~120cc of purulent material was returned.   Next the anteromedial portal was established. A shaver was introduced to help move fluid through the joint and improve visualization. A diagnostic arthroscopy was performed with findings as indicated above. Arthroscopic lavage was performed in all compartments until there was consistent clear fluid returning. The shaver was used to debride the synovium in all compartments of the knee. The inflamed fat pad was also debrided. A radiofrequency device was used to cauterize bleeding vessels. Arthroscopic fluid was removed from the joint. The arthroscope was removed from the joint while the sheath was kept in the suprapatellar pouch. A Hemovac drain was then placed through the sheath in the anterolateral portal.    The portals were closed with 3-0 Nylon suture. Sterile dressings included Xeroform, 4x4s, Sof-Rol, and Bias wrap. The patient was then awakened and taken to the PACU without complication.     POSTOPERATIVE PLAN: The patient will be continued on broad spectrum IV antibiotics. Infectious Diseases consult. Follow up OR culture results. Can be discharged on Aspirin  325  mg daily x 2 weeks for DVT prophylaxis. Weight-bearing as tolerated. Follow up  in 2 weeks as outpatient for suture removal.

## 2024-11-07 ENCOUNTER — Other Ambulatory Visit: Payer: Self-pay

## 2024-11-07 ENCOUNTER — Encounter: Payer: Self-pay | Admitting: Orthopedic Surgery

## 2024-11-07 DIAGNOSIS — M009 Pyogenic arthritis, unspecified: Secondary | ICD-10-CM

## 2024-11-07 DIAGNOSIS — T8149XA Infection following a procedure, other surgical site, initial encounter: Secondary | ICD-10-CM | POA: Diagnosis not present

## 2024-11-07 DIAGNOSIS — M00861 Arthritis due to other bacteria, right knee: Secondary | ICD-10-CM

## 2024-11-07 DIAGNOSIS — E11621 Type 2 diabetes mellitus with foot ulcer: Secondary | ICD-10-CM

## 2024-11-07 DIAGNOSIS — Z89432 Acquired absence of left foot: Secondary | ICD-10-CM

## 2024-11-07 DIAGNOSIS — B9689 Other specified bacterial agents as the cause of diseases classified elsewhere: Secondary | ICD-10-CM | POA: Diagnosis not present

## 2024-11-07 DIAGNOSIS — Z720 Tobacco use: Secondary | ICD-10-CM

## 2024-11-07 DIAGNOSIS — L97424 Non-pressure chronic ulcer of left heel and midfoot with necrosis of bone: Secondary | ICD-10-CM

## 2024-11-07 LAB — CBC
HCT: 25 % — ABNORMAL LOW (ref 39.0–52.0)
Hemoglobin: 7.9 g/dL — ABNORMAL LOW (ref 13.0–17.0)
MCH: 23.9 pg — ABNORMAL LOW (ref 26.0–34.0)
MCHC: 31.6 g/dL (ref 30.0–36.0)
MCV: 75.8 fL — ABNORMAL LOW (ref 80.0–100.0)
Platelets: 403 K/uL — ABNORMAL HIGH (ref 150–400)
RBC: 3.3 MIL/uL — ABNORMAL LOW (ref 4.22–5.81)
RDW: 13.9 % (ref 11.5–15.5)
WBC: 7.5 K/uL (ref 4.0–10.5)
nRBC: 0 % (ref 0.0–0.2)

## 2024-11-07 LAB — BASIC METABOLIC PANEL WITH GFR
Anion gap: 10 (ref 5–15)
BUN: 25 mg/dL — ABNORMAL HIGH (ref 6–20)
CO2: 22 mmol/L (ref 22–32)
Calcium: 8.5 mg/dL — ABNORMAL LOW (ref 8.9–10.3)
Chloride: 98 mmol/L (ref 98–111)
Creatinine, Ser: 1.29 mg/dL — ABNORMAL HIGH (ref 0.61–1.24)
GFR, Estimated: >60 mL/min (ref >60)
Glucose, Bld: 339 mg/dL — ABNORMAL HIGH (ref 70–99)
Potassium: 4.9 mmol/L (ref 3.5–5.1)
Sodium: 131 mmol/L — ABNORMAL LOW (ref 135–145)

## 2024-11-07 LAB — GLUCOSE, CAPILLARY
Glucose-Capillary: 350 mg/dL — ABNORMAL HIGH (ref 70–99)
Glucose-Capillary: 271 mg/dL — ABNORMAL HIGH (ref 70–99)
Glucose-Capillary: 316 mg/dL — ABNORMAL HIGH (ref 70–99)
Glucose-Capillary: 290 mg/dL — ABNORMAL HIGH (ref 70–99)

## 2024-11-07 LAB — CK: Total CK: 61 U/L (ref 49–397)

## 2024-11-07 LAB — C-REACTIVE PROTEIN: CRP: 13.6 mg/dL — ABNORMAL HIGH (ref <1.0)

## 2024-11-07 LAB — MAGNESIUM: Magnesium: 2.1 mg/dL (ref 1.7–2.4)

## 2024-11-07 MED ORDER — SODIUM CHLORIDE 0.9% FLUSH: 10.0000 mL | INTRAVENOUS | Status: DC | PRN

## 2024-11-07 MED ORDER — DAPTOMYCIN-SODIUM CHLORIDE 700-0.9 MG/100ML-% IV SOLN
8.0000 mg/kg | Freq: Every day | INTRAVENOUS | Status: DC
  Administered 2024-11-07: 700 mg via INTRAVENOUS
  Filled 2024-11-07 (×3): qty 100

## 2024-11-07 MED ORDER — MAGNESIUM CITRATE PO SOLN
1.0000 | Freq: Once | ORAL | Status: AC
  Administered 2024-11-07: 1 via ORAL
  Filled 2024-11-07: qty 296

## 2024-11-07 MED ORDER — ENOXAPARIN SODIUM 40 MG/0.4ML IJ SOSY
40.0000 mg | PREFILLED_SYRINGE | INTRAMUSCULAR | Status: DC
  Administered 2024-11-07 – 2024-11-08 (×2): 40 mg via SUBCUTANEOUS
  Filled 2024-11-07 (×2): qty 0.4

## 2024-11-07 MED ORDER — CHLORHEXIDINE GLUCONATE CLOTH 2 % EX PADS
6.0000 | MEDICATED_PAD | Freq: Every day | CUTANEOUS | Status: DC
  Administered 2024-11-07 – 2024-11-08 (×2): 6 via TOPICAL

## 2024-11-07 MED ORDER — SODIUM CHLORIDE 0.9 % IV SOLN
2.0000 g | Freq: Three times a day (TID) | INTRAVENOUS | Status: DC
  Administered 2024-11-07: 2 g via INTRAVENOUS
  Filled 2024-11-07 (×2): qty 12.5

## 2024-11-07 MED ORDER — OXYCODONE HCL 5 MG PO TABS
5.0000 mg | ORAL_TABLET | Freq: Once | ORAL | Status: AC
  Administered 2024-11-07: 5 mg via ORAL
  Filled 2024-11-07: qty 1

## 2024-11-07 MED ORDER — SODIUM CHLORIDE 0.9% FLUSH
10.0000 mL | Freq: Two times a day (BID) | INTRAVENOUS | Status: DC
  Administered 2024-11-07 – 2024-11-08 (×2): 10 mL

## 2024-11-07 MED ORDER — SODIUM CHLORIDE 0.9 % IV SOLN
1.0000 g | Freq: Three times a day (TID) | INTRAVENOUS | Status: DC
  Administered 2024-11-07 – 2024-11-08 (×2): 1 g via INTRAVENOUS
  Filled 2024-11-07 (×3): qty 20

## 2024-11-07 NOTE — Evaluation (Signed)
 Physical Therapy Evaluation Patient Details Name: [name] MRN: [id number] DOB: February 04, 1966 Today's Date: 11/07/2024  History of Present Illness  Pt is a 58 y/o M admitted on 10/15/24 after presenting with R knee pain & L foot wounds. 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 Pt is s/p L foot transmet amputation on 10/17/24 by podiatry. PMH: IDDM2, CKD 3A, obesity, HLD, HTN, cocaine abuse, RLE TMA. Pt is s/p irrigation and debridement (left) and wound vac application (left) 11/04/24. s/p R Right knee arthroscopic irrigation and debridement/lavage and drainage and Right knee complete synovectomy 12/04.  Clinical Impression  Patient noted to be in sitting position at PT arrival in room, for an initial PT evaluation due to a decline in functional status, with baseline mobility reported as modI, and currently requiring modI to stand from chair in room to maintain NWB precautions from sit <> stand. The patient is A&O x 4, presenting with fair willingness to work with PT and goals of going back to rehab, with discharge expectations that include SNF. The patient resides in a apartment alone with family/friend support. There is 1 STE inside the residence.  Pt often requires vc for NWB on LLE able to take few steps forwards and backwards at bedside modA. The overall clinical impression is that the patient presents with moderate mobility limitations secondary to recent medical complications. Recommended skilled PT will address safety, mobility, and discharge planning. PT recommendation to d/c patient to SNF upon medical clearance.        If plan is discharge home, recommend the following: A little help with walking and/or transfers;A little help with bathing/dressing/bathroom;Assist for transportation;Assistance with cooking/housework;Help with stairs or ramp for entrance   Can travel by private vehicle   No    Equipment Recommendations Rolling walker (2  wheels);BSC/3in1;Wheelchair (measurements PT);Wheelchair cushion (measurements PT)  Recommendations for Other Services       Functional Status Assessment Patient has had a recent decline in their functional status and demonstrates the ability to make significant improvements in function in a reasonable and predictable amount of time.     Precautions / Restrictions Precautions Precautions: Fall Recall of Precautions/Restrictions: Impaired Precaution/Restrictions Comments: CAM boot LLE, boot RLE Required Braces or Orthoses: Other Brace Restrictions Weight Bearing Restrictions Per Provider Order: No LLE Weight Bearing Per Provider Order: Non weight bearing Other Position/Activity Restrictions: precautions via secure chat with podiatry DM      Mobility  Bed Mobility Overal bed mobility: Independent Bed Mobility: Sit to Supine, Supine to Sit     Supine to sit: Modified independent (Device/Increase time) Sit to supine: Modified independent (Device/Increase time)        Transfers Overall transfer level: Needs assistance Equipment used: Rolling walker (2 wheels) Transfers: Sit to/from Stand Sit to Stand: Min assist, Contact guard assist                Ambulation/Gait Ambulation/Gait assistance: Min assist, Contact guard assist Gait Distance (Feet): 6 Feet Assistive device: Rolling walker (2 wheels) Gait Pattern/deviations: Decreased step length - right, Decreased stride length, Decreased dorsiflexion - right Gait velocity: decreased     General Gait Details: inconsistant NWB often reverting back to PWB on LLE  Stairs            Wheelchair Mobility     Tilt Bed    Modified Rankin (Stroke Patients Only)       Balance Overall balance assessment: Needs assistance Sitting-balance support: Feet supported Sitting  balance-Leahy Scale: Normal Sitting balance - Comments: able to don BLE post op shoes sitting EOB without LOB   Standing balance support: No  upper extremity supported, Reliant on assistive device for balance Standing balance-Leahy Scale: Poor                               Pertinent Vitals/Pain Pain Assessment Pain Score: 6  Pain Location: Left foot, R knee Pain Descriptors / Indicators: Constant, Discomfort, Grimacing, Guarding Pain Intervention(s): Limited activity within patient's tolerance, Monitored during session, Repositioned    Home Living Family/patient expects to be discharged to:: Private residence Living Arrangements: Alone Available Help at Discharge: Family;Available PRN/intermittently Type of Home: Apartment Home Access: Stairs to enter Entrance Stairs-Rails: Right;Left Entrance Stairs-Number of Steps: 1   Home Layout: One level Home Equipment: Agricultural Consultant (2 wheels);Wheelchair - manual;Shower seat Additional Comments: pt reports he was NWB at home, primarily using a wc but was able to use RW to walk into to ED.    Prior Function                       Extremity/Trunk Assessment   Upper Extremity Assessment Upper Extremity Assessment: Generalized weakness    Lower Extremity Assessment Lower Extremity Assessment: Generalized weakness;RLE deficits/detail;LLE deficits/detail RLE Deficits / Details: R TMA, R knee pain LLE Deficits / Details: L TMA    Cervical / Trunk Assessment Cervical / Trunk Assessment: Normal  Communication   Communication Communication: No apparent difficulties    Cognition Arousal: Alert Behavior During Therapy: WFL for tasks assessed/performed   PT - Cognitive impairments: Safety/Judgement                       PT - Cognition Comments: pt was frustrated in response to needing to mobilize Following commands: Intact Following commands impaired: Follows one step commands inconsistently     Cueing Cueing Techniques: Verbal cues     General Comments      Exercises     Assessment/Plan    PT Assessment Patient needs continued PT  services  PT Problem List Decreased strength;Decreased balance;Decreased activity tolerance;Decreased mobility;Decreased knowledge of precautions;Decreased safety awareness;Decreased knowledge of use of DME;Decreased skin integrity;Pain       PT Treatment Interventions DME instruction;Balance training;Gait training;Neuromuscular re-education;Stair training;Functional mobility training;Patient/family education;Therapeutic activities;Manual techniques;Therapeutic exercise    PT Goals (Current goals can be found in the Care Plan section)  Acute Rehab PT Goals Patient Stated Goal: go back to rehab PT Goal Formulation: With patient Time For Goal Achievement: 11/18/24 Potential to Achieve Goals: Fair    Frequency Min 2X/week     Co-evaluation               AM-PAC PT 6 Clicks Mobility  Outcome Measure Help needed turning from your back to your side while in a flat bed without using bedrails?: None Help needed moving from lying on your back to sitting on the side of a flat bed without using bedrails?: None Help needed moving to and from a bed to a chair (including a wheelchair)?: None Help needed standing up from a chair using your arms (e.g., wheelchair or bedside chair)?: A Lot Help needed to walk in hospital room?: Total Help needed climbing 3-5 steps with a railing? : Total 6 Click Score: 16    End of Session   Activity Tolerance: Patient limited by pain Patient left: with call bell/phone  within reach;in chair Nurse Communication: Mobility status PT Visit Diagnosis: Other abnormalities of gait and mobility (R26.89);Difficulty in walking, not elsewhere classified (R26.2);Muscle weakness (generalized) (M62.81)    Time: 8874-8855 PT Time Calculation (min) (ACUTE ONLY): 19 min   Charges:   PT Evaluation $PT Eval Low Complexity: 1 Low   PT General Charges $$ ACUTE PT VISIT: 1 Visit         Sherlean Lesches DPT, PT    Sherlean A Cydne Grahn 11/07/2024, 11:56 AM

## 2024-11-07 NOTE — Consult Note (Signed)
 Infectious Disease     Reason for Consult:Septic knee, TMA site infection    Referring Physician: Patel, Sona, MD  Date of Admission:  11/03/2024   Principal Problem:   Wound infection Active Problems:   Diabetic foot ulcer (HCC)   Obesity (BMI 30-39.9)   Tobacco abuse   Right knee pain   HPI: [name] is a 58 y.o. male  With a history of chronic osteomyelitis of the left foot, diabetes, neuropathy, chronic kidney disease, hyperlipidemia, hypertension, history of cocaine abuse with recent admission and left TMA November 14 who was home on ciprofloxacin  and Augmentin  when he Eilene presented November 1 to the emergency room with worsening wound to the left foot as well as significant right knee pain. On admission temperature 102 white count 10.9.  He was started December 2 on cefepime  and vancomycin .  He had a MRI of his knee which showed large knee effusion with enhancement and suprapatellar bursal fluid new marrow edema along the tibial spine was felt to be septic arthritis.  He underwent December 4 arthroscopic I&D by Dr. Tobie with findings of synovitis.  Fluid had 83,000 white cells on aspiration 98% PMNs.  Cultures pending from December 3. He had swabs  done of his wound November December 1 and this grew moderate Enterobacter factor moderate corynebacterium moderate Morganella.  The Morganella was multidrug-resistant but was sensitive to ertapenem  gentamicin meropenem  and Zosyn .  Enterobacter sensitivities are pending. Podiatry has seen him and on December 3 he underwent irrigation and debridement of his wound and application of a wound VAC.  He was found to have 50% necrosis of the rotational flap distally but minimally exposed metatarsal with heavy granulation tissue.  Currently feels stable. No fevers. Has wv on foot. Knee in immobilizer.   Past Medical History:  Diagnosis Date   Amputation of right great toe    Cellulitis and abscess of foot    CKD stage 3a, GFR 45-59 ml/min (HCC)     Cocaine abuse (HCC)    + UDS on 05-12-24   DM (diabetes mellitus), type 2 (HCC)    ESBL (extended spectrum beta-lactamase) producing bacteria infection 04/18/2024   Hyperlipidemia    Hypertension    Methicillin resistant Staphylococcus aureus infection 07/14/2024   MRSA bacteremia    Normocytic anemia    Obesity    Tobacco abuse    Toe osteomyelitis, left (HCC)    Tuberculosis    tested positive,treated with medications.   Past Surgical History:  Procedure Laterality Date   AMPUTATION TOE Right 03/26/2024   Procedure: AMPUTATION, TOE;  Surgeon: Harden Jerona GAILS, MD;  Location: [facility] OR;  Service: Orthopedics;  Laterality: Right;  RIGHT GREAT TOE AMPUTATION   APPLICATION OF WOUND VAC Left 11/04/2024   Procedure: APPLICATION, WOUND VAC;  Surgeon: Tanda Greig MATSU, DPM;  Location: [facility] ORS;  Service: Podiatry;  Laterality: Left;  wound vac available intraop please.   BONE BIOPSY  04/18/2024   Procedure: BIOPSY, BONE (2ND METATARSAL);  Surgeon: Ashley Soulier, DPM;  Location: [facility] ORS;  Service: Orthopedics/Podiatry;;   FOOT SURGERY Left    I & D EXTREMITY Left 05/05/2022   Procedure: LEFT FOOT DEBRIDEMENT;  Surgeon: Harden Jerona GAILS, MD;  Location: [facility] OR;  Service: Orthopedics;  Laterality: Left;   INCISION AND DRAINAGE OF WOUND Right 06/05/2024   Procedure: DELAYED PRIMARY CLOSURE OF RIGHT FOOT WOUND, EXCISION OF DISTAL METATARSAL 2, 3, 4, and 5;  Surgeon: Ashley Soulier, DPM;  Location: [facility] ORS;  Service: Orthopedics/Podiatry;  Laterality:  Right;   INCISION AND DRAINAGE OF WOUND Left 11/04/2024   Procedure: IRRIGATION AND DEBRIDEMENT WOUND;  Surgeon: Tanda Greig MATSU, DPM;  Location: [facility] ORS;  Service: Podiatry;  Laterality: Left;   IRRIGATION AND DEBRIDEMENT FOOT Right 04/18/2024   Procedure: IRRIGATION AND DEBRIDEMENT FOOT, PLACEMENT OF ANTIBIOTIC BEADS;  Surgeon: Ashley Soulier, DPM;  Location: [facility] ORS;  Service: Orthopedics/Podiatry;  Laterality: Right;   IRRIGATION AND DEBRIDEMENT FOOT Left  06/29/2024   Procedure: IRRIGATION AND DEBRIDEMENT FOOT;  Surgeon: Lennie Barter, DPM;  Location: [facility] ORS;  Service: Orthopedics/Podiatry;  Laterality: Left;   KNEE ARTHROSCOPY Right 11/06/2024   Procedure: ARTHROSCOPY, KNEE;  Surgeon: Tobie Priest, MD;  Location: [facility] ORS;  Service: Orthopedics;  Laterality: Right;  ARTHROSCOPIC KNEE WASHOUT   MYRINGOTOMY WITH TUBE PLACEMENT Bilateral 07/20/2022   Procedure: MYRINGOTOMY WITH TUBE PLACEMENT;  Surgeon: Edda Mt, MD;  [facility];  Service: ENT;  Laterality: Bilateral;  Diabetic   TEE WITHOUT CARDIOVERSION N/A 07/02/2024   Procedure: ECHOCARDIOGRAM, TRANSESOPHAGEAL;  Surgeon: Perla Evalene PARAS, MD;  Location: [facility] ORS;  Service: Cardiovascular;  Laterality: N/A;   TONSILLECTOMY     TRANSMETATARSAL AMPUTATION Right 04/23/2024   Procedure: AMPUTATION, FOOT, TRANSMETATARSAL;  Surgeon: Ashley Soulier, DPM;  Location: [facility] ORS;  Service: Orthopedics/Podiatry;  Laterality: Right;   TRANSMETATARSAL AMPUTATION Right 06/29/2024   Procedure: AMPUTATION, FOOT, TRANSMETATARSAL;  Surgeon: Lennie Barter, DPM;  Location: [facility] OR[facility];  Service: Orthopedics/Podiatry;  Laterality: Right;  RIGHT TRANSMETATARSAL AMPUTATION REVISION   TRANSMETATARSAL AMPUTATION Left 10/17/2024   Procedure: AMPUTATION, FOOT, TRANSMETATARSAL;  Surgeon: Tanda Greig MATSU, DPM;  Location: [facility] OR[facility];  Service: Podiatry;  Laterality: Left;  pulse lavage available   Social History   Tobacco Use   Smoking status: Former    Current packs/day: 0.00    Average packs/day: 0.3 packs/day for 25.0 years (6.3 ttl pk-yrs)    Types: Cigarettes    Start date: 09/17/1997    Quit date: 09/17/2022    Years since quitting: 2.1   Smokeless tobacco: Never   Tobacco comments:    Quit  Vaping Use   Vaping status: Never Used  Substance Use Topics   Alcohol use: No   Drug use: No   Family History  Problem Relation Age of Onset   Colon cancer Neg Hx    Colon polyps Neg Hx    Crohn's  disease Neg Hx    Esophageal cancer Neg Hx    Rectal cancer Neg Hx    Stomach cancer Neg Hx    Ulcerative colitis Neg Hx     Allergies: No Known Allergies  Current antibiotics: Antibiotics Given (last 72 hours)     Date/Time Action Medication Dose Rate   11/04/24 1508 New Bag/Given   ceFEPIme  (MAXIPIME ) 2 g in sodium chloride  0.9 % 100 mL IVPB 2 g 200 mL/hr   11/04/24 1558 New Bag/Given   vancomycin  (VANCOCIN ) IVPB 1000 mg/200 mL premix 1,000 mg 200 mL/hr   11/04/24 1635 New Bag/Given   vancomycin  (VANCOREADY) IVPB 1500 mg/300 mL 1,500 mg 150 mL/hr   11/05/24 0148 New Bag/Given  [MRI]   ceFEPIme  (MAXIPIME ) 2 g in sodium chloride  0.9 % 100 mL IVPB 2 g 200 mL/hr   11/05/24 0858 New Bag/Given   ceFEPIme  (MAXIPIME ) 2 g in sodium chloride  0.9 % 100 mL IVPB 2 g 200 mL/hr   11/05/24 1523 New Bag/Given   vancomycin  (VANCOREADY) IVPB 1500 mg/300 mL 1,500 mg 150 mL/hr   11/05/24 1753 New Bag/Given   ceFEPIme  (MAXIPIME )  2 g in sodium chloride  0.9 % 100 mL IVPB 2 g 200 mL/hr   11/06/24 0231 New Bag/Given   ceFEPIme  (MAXIPIME ) 2 g in sodium chloride  0.9 % 100 mL IVPB 2 g 200 mL/hr   11/06/24 0905 New Bag/Given   ceFEPIme  (MAXIPIME ) 2 g in sodium chloride  0.9 % 100 mL IVPB 2 g 200 mL/hr   11/06/24 1144 New Bag/Given   vancomycin  (VANCOREADY) IVPB 750 mg/150 mL 750 mg 150 mL/hr   11/06/24 2242 New Bag/Given   vancomycin  (VANCOREADY) IVPB 750 mg/150 mL 750 mg 150 mL/hr   11/07/24 0201 New Bag/Given   ceFEPIme  (MAXIPIME ) 2 g in sodium chloride  0.9 % 100 mL IVPB 2 g 200 mL/hr   11/07/24 0845 New Bag/Given   vancomycin  (VANCOREADY) IVPB 750 mg/150 mL 750 mg 150 mL/hr   11/07/24 1231 New Bag/Given   ceFEPIme  (MAXIPIME ) 2 g in sodium chloride  0.9 % 100 mL IVPB 2 g 200 mL/hr       MEDICATIONS:  diclofenac  Sodium  4 g Topical QID   enoxaparin  (LOVENOX ) injection  40 mg Subcutaneous Q24H   insulin  aspart  0-15 Units Subcutaneous TID WC   insulin  aspart  0-5 Units Subcutaneous QHS    magnesium  citrate  1 Bottle Oral Once    Review of Systems - 11 systems reviewed and negative per HPI   OBJECTIVE: Temp:  [97.1 F (36.2 C)-98.3 F (36.8 C)] 97.9 F (36.6 C) (12/05 0814) Pulse Rate:  [78-88] 88 (12/05 0814) Resp:  [14-26] 17 (12/05 0814) BP: (121-151)/(68-97) 137/83 (12/05 0814) SpO2:  [98 %-100 %] 98 % (12/05 0814) Physical Exam  Constitutional: He is oriented to person, place, and time. He appears well-developed and well-nourished. No distress.  HENT:  anicteric Mouth/Throat: Oropharynx is clear and moist. No oropharyngeal exudate.  Cardiovascular: Normal rate, regular rhythm and normal heart sounds.  Pulmonary/Chest: Effort normal and breath sounds normal. No respiratory distress. He has no wheezes.  Abdominal: Soft. Bowel sounds are normal. He exhibits no distension. There is no tenderness.  Lymphadenopathy:he has no cervical adenopathy.  Neurological: He is alert and oriented to person, place, and time.  Skin:  photos below       Psychiatric: He has a normal mood and affect. His behavior is normal.     LABS: Results for orders placed or performed during the hospital encounter of 11/03/24 (from the past 48 hours)  Glucose, capillary     Status: Abnormal   Collection Time: 11/05/24  4:38 PM  Result Value Ref Range   Glucose-Capillary 162 (H) 70 - 99 mg/dL    Comment: Glucose reference range applies only to samples taken after fasting for at least 8 hours.  Glucose, capillary     Status: Abnormal   Collection Time: 11/05/24  8:11 PM  Result Value Ref Range   Glucose-Capillary 114 (H) 70 - 99 mg/dL    Comment: Glucose reference range applies only to samples taken after fasting for at least 8 hours.  CBC     Status: Abnormal   Collection Time: 11/06/24  5:24 AM  Result Value Ref Range   WBC 7.0 4.0 - 10.5 K/uL   RBC 3.10 (L) 4.22 - 5.81 MIL/uL   Hemoglobin 7.5 (L) 13.0 - 17.0 g/dL   HCT 75.3 (L) 60.9 - 47.9 %   MCV 79.4 (L) 80.0 - 100.0 fL   MCH  24.2 (L) 26.0 - 34.0 pg   MCHC 30.5 30.0 - 36.0 g/dL   RDW 85.7 88.4 -  15.5 %   Platelets 364 150 - 400 K/uL   nRBC 0.0 0.0 - 0.2 %    Comment: Performed at [facility], [address] [place], [place] [address]  Magnesium      Status: None   Collection Time: 11/06/24  5:24 AM  Result Value Ref Range   Magnesium  2.1 1.7 - 2.4 mg/dL    Comment: Performed at [facility], [address] [place], [place] [address]  Basic metabolic panel with GFR     Status: Abnormal   Collection Time: 11/06/24  5:24 AM  Result Value Ref Range   Sodium 133 (L) 135 - 145 mmol/L   Potassium 4.6 3.5 - 5.1 mmol/L   Chloride 101 98 - 111 mmol/L   CO2 24 22 - 32 mmol/L   Glucose, Bld 138 (H) 70 - 99 mg/dL    Comment: Glucose reference range applies only to samples taken after fasting for at least 8 hours.   BUN 20 6 - 20 mg/dL   Creatinine, Ser 8.86 0.61 - 1.24 mg/dL   Calcium  8.5 (L) 8.9 - 10.3 mg/dL   GFR, Estimated >39 >39 mL/min    Comment: (NOTE) Calculated using the CKD-EPI Creatinine Equation (2021)    Anion gap 8 5 - 15    Comment: Performed at [facility], [address] [place], [place] [address]  Glucose, capillary     Status: Abnormal   Collection Time: 11/06/24  8:07 AM  Result Value Ref Range   Glucose-Capillary 114 (H) 70 - 99 mg/dL    Comment: Glucose reference range applies only to samples taken after fasting for at least 8 hours.  Glucose, capillary     Status: Abnormal   Collection Time: 11/06/24 12:32 PM  Result Value Ref Range   Glucose-Capillary 105 (H) 70 - 99 mg/dL    Comment: Glucose reference range applies only to samples taken after fasting for at least 8 hours.  Aerobic/Anaerobic Culture w Gram Stain (surgical/deep wound)     Status: None (Preliminary result)   Collection Time: 11/06/24  4:27 PM   Specimen: Wound; Body Fluid  Result Value Ref Range   Specimen Description      WOUND Performed at [facility], [address] [place], [place] [address]    Special Requests      NONE Performed at [facility], [address] [place], [place] [address]    Gram Stain      ABUNDANT WBC PRESENT, PREDOMINANTLY PMN NO ORGANISMS SEEN Performed at [facility], [address] [place], [place] [address]    Culture PENDING    Report Status PENDING   Glucose, capillary     Status: Abnormal   Collection Time: 11/06/24  5:23 PM  Result Value Ref Range   Glucose-Capillary 117 (H) 70 - 99 mg/dL    Comment: Glucose reference range applies only to samples taken after fasting for at least 8 hours.  Glucose, capillary     Status: Abnormal   Collection Time: 11/06/24  6:40 PM  Result Value Ref Range   Glucose-Capillary 169 (H) 70 - 99 mg/dL    Comment: Glucose reference range applies only to samples taken after fasting for at least 8 hours.  Glucose, capillary     Status: Abnormal   Collection Time: 11/06/24  9:47 PM  Result Value Ref Range   Glucose-Capillary 365 (H) 70 - 99 mg/dL    Comment: Glucose reference range applies only to  samples taken after fasting for at least 8 hours.  CBC     Status: Abnormal   Collection Time: 11/07/24  6:12 AM  Result Value Ref Range   WBC 7.5 4.0 - 10.5 K/uL   RBC 3.30 (L) 4.22 - 5.81 MIL/uL   Hemoglobin 7.9 (L) 13.0 - 17.0 g/dL    Comment: Reticulocyte Hemoglobin testing may be clinically indicated, consider ordering this additional test [id number]    HCT 25.0 (L) 39.0 - 52.0 %   MCV 75.8 (L) 80.0 - 100.0 fL   MCH 23.9 (L) 26.0 - 34.0 pg   MCHC 31.6 30.0 - 36.0 g/dL   RDW 86.0 88.4 - 84.4 %   Platelets 403 (H) 150 - 400 K/uL   nRBC 0.0 0.0 - 0.2 %    Comment: Performed at [facility], [address], [place], [place] [address]  Basic metabolic panel with GFR     Status: Abnormal   Collection Time: 11/07/24  6:12 AM  Result Value Ref Range   Sodium 131 (L) 135 - 145 mmol/L   Potassium 4.9 3.5 - 5.1 mmol/L   Chloride 98 98 - 111 mmol/L   CO2 22 22  - 32 mmol/L   Glucose, Bld 339 (H) 70 - 99 mg/dL    Comment: Glucose reference range applies only to samples taken after fasting for at least 8 hours.   BUN 25 (H) 6 - 20 mg/dL   Creatinine, Ser 8.70 (H) 0.61 - 1.24 mg/dL   Calcium  8.5 (L) 8.9 - 10.3 mg/dL   GFR, Estimated >39 >39 mL/min    Comment: (NOTE) Calculated using the CKD-EPI Creatinine Equation (2021)    Anion gap 10 5 - 15    Comment: Performed at [facility], [address] [place], [place] [address]  Magnesium      Status: None   Collection Time: 11/07/24  6:12 AM  Result Value Ref Range   Magnesium  2.1 1.7 - 2.4 mg/dL    Comment: Performed at [facility], [address] [place], [place] [address]  Glucose, capillary     Status: Abnormal   Collection Time: 11/07/24  8:15 AM  Result Value Ref Range   Glucose-Capillary 350 (H) 70 - 99 mg/dL    Comment: Glucose reference range applies only to samples taken after fasting for at least 8 hours.  Glucose, capillary     Status: Abnormal   Collection Time: 11/07/24 11:30 AM  Result Value Ref Range   Glucose-Capillary 271 (H) 70 - 99 mg/dL    Comment: Glucose reference range applies only to samples taken after fasting for at least 8 hours.   No components found for: ESR, C REACTIVE PROTEIN MICRO: Recent Results (from the past 720 hours)  Body fluid culture w Gram Stain     Status: None   Collection Time: 10/11/24  9:53 PM   Specimen: Body Fluid  Result Value Ref Range Status   Specimen Description FLUID  Final   Special Requests NONE  Final   Gram Stain   Final    FEW WBC PRESENT, PREDOMINANTLY PMN NO ORGANISMS SEEN    Culture   Final    NO GROWTH 3 DAYS Performed at [facility], [address] [place], [place] [address]    Report Status 10/15/2024 FINAL  Final  Blood culture (routine x 2)     Status: None   Collection Time: 10/15/24  3:01 PM   Specimen: BLOOD  Result Value Ref  Range Status   Specimen Description BLOOD BLOOD LEFT  FOREARM  Final   Special Requests   Final    BOTTLES DRAWN AEROBIC AND ANAEROBIC Blood Culture adequate volume   Culture   Final    NO GROWTH 5 DAYS Performed at [facility], [address] [place], [place] [address]    Report Status 10/20/2024 FINAL  Final  Blood culture (routine x 2)     Status: None   Collection Time: 10/15/24  3:01 PM   Specimen: BLOOD  Result Value Ref Range Status   Specimen Description BLOOD BLOOD LEFT ARM  Final   Special Requests   Final    BOTTLES DRAWN AEROBIC AND ANAEROBIC Blood Culture adequate volume   Culture   Final    NO GROWTH 5 DAYS Performed at [facility], [address] [place], [place] [address]    Report Status 10/20/2024 FINAL  Final  Aerobic/Anaerobic Culture w Gram Stain (surgical/deep wound)     Status: None   Collection Time: 10/17/24  5:56 PM   Specimen: Foot, Left; Tissue  Result Value Ref Range Status   Specimen Description   Final    WOUND Performed at [facility], [address] [place], [place] [address]    Special Requests   Final    LEFT FOOT Performed at [facility], [address] [place], [place] [address]    Gram Stain   Final    RARE WBC PRESENT, PREDOMINANTLY PMN RARE GRAM POSITIVE COCCI    Culture   Final    RARE PSEUDOMONAS AERUGINOSA RARE STREPTOCOCCUS ANGINOSIS SUSCEPTIBILITIES PERFORMED ON PREVIOUS CULTURE WITHIN THE LAST 5 DAYS. NO ANAEROBES ISOLATED Performed at [facility], [address] [place], [place] [address]    Report Status 10/23/2024 FINAL  Final   Organism ID, Bacteria STREPTOCOCCUS ANGINOSIS  Final      Susceptibility   Streptococcus anginosis - MIC*    PENICILLIN  <=0.06 SENSITIVE Sensitive     CEFTRIAXONE  <=0.12 SENSITIVE Sensitive     ERYTHROMYCIN >=8 RESISTANT Resistant     LEVOFLOXACIN  0.5 SENSITIVE Sensitive     VANCOMYCIN  0.5 SENSITIVE Sensitive     * RARE STREPTOCOCCUS ANGINOSIS  Aerobic/Anaerobic Culture w Gram Stain  (surgical/deep wound)     Status: None   Collection Time: 10/17/24  6:47 PM   Specimen: Foot, Left; Tissue  Result Value Ref Range Status   Specimen Description   Final    TISSUE Performed at [facility], [address] [place], [place] [address]    Special Requests   Final    LEFT FOOT Performed at [facility], [address] [place], [place] [address]    Gram Stain   Final    RARE WBC PRESENT, PREDOMINANTLY PMN NO ORGANISMS SEEN    Culture   Final    RARE PSEUDOMONAS AERUGINOSA RARE STREPTOCOCCUS GROUP G Beta hemolytic streptococci are predictably susceptible to penicillin  and other beta lactams. Susceptibility testing not routinely performed. NO ANAEROBES ISOLATED Performed at [facility], [address] [place], [place] [address]    Report Status 10/23/2024 FINAL  Final   Organism ID, Bacteria PSEUDOMONAS AERUGINOSA  Final      Susceptibility   Pseudomonas aeruginosa - MIC*    MEROPENEM  >=16 RESISTANT Resistant     CIPROFLOXACIN  0.25 SENSITIVE Sensitive     IMIPENEM >=16 RESISTANT Resistant     CEFTAZIDIME/AVIBACTAM 2 SENSITIVE Sensitive     CEFTOLOZANE/TAZOBACTAM 1 SENSITIVE  Sensitive     TOBRAMYCIN <=1 SENSITIVE Sensitive     CEFTAZIDIME >=32 RESISTANT Resistant     * RARE PSEUDOMONAS AERUGINOSA  Aerobic/Anaerobic Culture w Gram Stain (surgical/deep wound)     Status: None (Preliminary result)   Collection Time: 11/03/24  7:56 PM   Specimen: Wound  Result Value Ref Range Status   Specimen Description   Final    WOUND Performed at [facility], [address], [place], [place] [address]    Special Requests   Final    NONE Performed at [facility], [address] [place], [place] [address]    Gram Stain   Final    NO WBC SEEN MODERATE GRAM POSITIVE COCCI RARE GRAM NEGATIVE RODS RARE GRAM POSITIVE RODS Performed at [facility], [address] [place], [place] [address]    Culture   Final     MODERATE ENTEROBACTER HORMAECHEI MODERATE CORYNEBACTERIUM SPECIES Standardized susceptibility testing for this organism is not available. MODERATE MORGANELLA MORGANII NO ANAEROBES ISOLATED; CULTURE IN PROGRESS FOR 5 DAYS    Report Status PENDING  Incomplete   Organism ID, Bacteria MORGANELLA MORGANII  Final      Susceptibility   Morganella morganii - MIC*    AMPICILLIN  >=32 RESISTANT Resistant     ERTAPENEM  <=0.12 SENSITIVE Sensitive     CIPROFLOXACIN  2 RESISTANT Resistant     GENTAMICIN <=1 SENSITIVE Sensitive     MEROPENEM  <=0.25 SENSITIVE Sensitive     TRIMETH/SULFA >=320 RESISTANT Resistant     AMPICILLIN /SULBACTAM 16 INTERMEDIATE Intermediate     PIP/TAZO Value in next row Sensitive      <=4 SENSITIVEThis is a modified FDA-approved test that has been validated and its performance characteristics determined by the reporting laboratory.  This laboratory is certified under the Clinical Laboratory Improvement Amendments CLIA as qualified to perform high complexity clinical laboratory testing.    * MODERATE MORGANELLA MORGANII  MRSA Next Gen by PCR, Nasal     Status: None   Collection Time: 11/04/24  1:21 AM   Specimen: Nasal Mucosa; Nasal Swab  Result Value Ref Range Status   MRSA by PCR Next Gen NOT DETECTED NOT DETECTED Final    Comment: (NOTE) The GeneXpert MRSA Assay (FDA approved for NASAL specimens only), is one component of a comprehensive MRSA colonization surveillance program. It is not intended to diagnose MRSA infection nor to guide or monitor treatment for MRSA infections. Test performance is not FDA approved in patients less than 59 years old. Performed at [facility], [address] [place], [place] [address]   Body fluid culture w Gram Stain     Status: None (Preliminary result)   Collection Time: 11/05/24  1:10 PM   Specimen: Synovium; Body Fluid  Result Value Ref Range Status   Specimen Description   Final    SYNOVIAL Performed at [facility], [address] [place], [place] [address]    Special Requests   Final    RIGHT KNEE Performed at [facility], [address] [place], [place] [address]    Gram Stain   Final    ABUNDANT WBC PRESENT, PREDOMINANTLY PMN NO ORGANISMS SEEN    Culture   Final    NO GROWTH 2 DAYS Performed at [facility], [address], [place], [place] [address]    Report Status PENDING  Incomplete  Aerobic/Anaerobic Culture w Gram Stain (surgical/deep wound)     Status: None (Preliminary result)  Collection Time: 11/06/24  4:27 PM   Specimen: Wound; Body Fluid  Result Value Ref Range Status   Specimen Description   Final    WOUND Performed at [facility], [address] [place], [place] [address]    Special Requests   Final    NONE Performed at [facility], [address] [place], [place] [address]    Gram Stain   Final    ABUNDANT WBC PRESENT, PREDOMINANTLY PMN NO ORGANISMS SEEN Performed at [facility], [address], [place], [place] [address]    Culture PENDING  Incomplete   Report Status PENDING  Incomplete    IMAGING: MR KNEE RIGHT W WO CONTRAST Result Date: 11/05/2024 EXAM: MRI of the right Knee With and Without Contrast. 11/05/2024 01:32:47 AM TECHNIQUE: Multiplanar multisequence MRI of the right knee was performed with and without intravenous contrast. CONTRAST: 10 mL of Gadavist . COMPARISON: MR Knee 09/28/2024. CLINICAL HISTORY: Chronic knee pain and swelling. FINDINGS: MEDIAL MENISCUS: Small grade 3 oblique tear of the posterior horn medial meniscus extends to the inferior meniscal surface as on images 22 through 25 series 12. LATERAL MENISCUS: Intact lateral meniscus. ANTERIOR CRUCIATE LIGAMENT: Intact anterior cruciate ligament. POSTERIOR CRUCIATE LIGAMENT: Intact posterior cruciate ligament. EXTENSOR MECHANISM: Intact quadriceps and patellar tendons. Intact patellar retinacula. LATERAL COLLATERAL LIGAMENT COMPLEX: Intact IT  band, lateral collateral ligament proper, biceps femoris tendon and popliteus tendon. MEDIAL COLLATERAL LIGAMENT COMPLEX: The superficial and deep components of the medial collateral ligament are intact. KNEE JOINT: Mild degenerative chondral thinning in the medial compartment. Large knee joint effusion with relatively thin synovial enhancement. The effusion has increased in volume compared to the 09/28/2024 examination. Large amount of fluid in the suprapatellar bursa. Fluid extends down in the popliteus recess. Small baker cyst. BONE MARROW: Progressive marrow edema along the tibial spine, etiology uncertain. Mild progressive marrow edema below the medial patellar facet. SOFT TISSUE: Infiltrative edema in the popliteal space. Enlarged lymph nodes in the upper popliteal region and along the distal femoral artery up to 1.2 cm in short axis on image 17 series 12, probably reactive lymph nodes. Edema signal in the popliteus muscle, cannot exclude muscle strain or tear. Mild edema along the distal vastus medialis muscle posteriorly. Prepatellar and anterior subcutaneous edema along the knee. IMPRESSION: 1. Large knee joint effusion with thin synovial enhancement, increased in volume compared to the prior examination, with large suprapatellar bursal fluid and fluid extending into the popliteus recess and a small Baker cyst. Given the progressive volume, likely reactive adenopathy, new marrow edema along the tibial spine, septic arthritis is not entirely excluded and arthrocentesis may be indicated. 2. Small grade 3 oblique tear of the posterior horn medial meniscus extending to the inferior meniscal surface. 3. Mild degenerative chondral thinning in the medial compartment. 4. Edema signal in the popliteus muscle, cannot exclude strain or tear, with mild edema along the distal vastus medialis muscle posteriorly. 5. Progressive marrow edema along the tibial spine, etiology uncertain, with mild progressive marrow edema  below the medial patellar facet. 6. Enlarged lymph nodes in the upper popliteal region and along the distal femoral artery, likely reactive. Electronically signed by: Ryan Salvage MD 11/05/2024 09:41 AM EST RP Workstation: [id number]   DG Foot 2 Views Left Result Date: 11/03/2024 EXAM: 2 VIEW(S) XRAY OF THE LEFT FOOT 11/03/2024 05:45:00 PM COMPARISON: 10/23/2024 CLINICAL HISTORY: infection FINDINGS: BONES AND JOINTS: Suspect developing lucency within the distal most 2nd metatarsal. Periosteal reaction about the 5th metatarsal shaft  is unchanged. No acute fracture. No joint dislocation. SOFT TISSUES: Soft tissue defect distal to the remaining 2nd metatarsal. Soft tissue gas and overlying bandaged material. IMPRESSION: 1. Soft tissue ulcer distal to the 2nd metatarsal with suggestion of osteomyelitis within the distal most 2nd metatarsal shaft. Consider further evaluation with pre and postcontrast MRI. 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 Similar appearance of periosteal reaction in residual fifth metatarsal. No joint dislocation. SOFT TISSUES: Vascular calcifications. Soft tissue swelling with air in soft tissues. IMPRESSION: 1. 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 2. Soft tissue swelling with soft tissue gas compatible with postoperative change . Electronically signed by: Waddell Calk MD 10/23/2024 02:15 PM EST RP Workstation: [id number]   DG MINI C-ARM IMAGE ONLY Result Date: 10/17/2024 There is no interpretation for this exam.  This order is for images obtained during a surgical procedure.  Please See Surgeries Tab for more information regarding the procedure.   MR FOOT  LEFT W WO CONTRAST Result Date: 10/17/2024 MR FOOT WITHOUT THEN WITH IV CONTRAST LEFT COMPARISON: None. CLINICAL HISTORY: Wound, osteomyelitis. PULSE SEQUENCES: Ax T1, Ax T2 FS, Sag T1, Sag T2 FS, Cor STIR, Cor T1 with and without IV contrast. FINDINGS: Bones: There is a large wound in the plantar aspect of the foot which communicates with the second metatarsal head and third metatarsal head. There is destruction of the second and third metatarsal heads moderate reactive edema. There is also destruction proximal phalanx second toe consistent with osteomyelitis. Fluid surrounding the second metatarsal head likely poorly defined abscess. No significant collection is identified. Degenerative changes are seen in the first ray with a mild hallux valgus moderate degenerative arthrosis third metatarsophalangeal joint. The fifth distal metatarsal has the with sparing of the fifth toe. Musculotendinous structures: Extensor tendons are grossly intact. The flexor tendons are poorly defined. The flexor tendon of the second and third toes are not seen. This may be related to chronic disruption. Mild cellulitis is present. IMPRESSION: Plantar surface wound which communicates with the second metatarsal head as above. There is destruction of the second metatarsal head and second proximal phalanx. There is remodeling of the third metatarsal head. Findings are consistent with osteomyelitis. There is fluid surrounding the second metatarsal head without evidence of a well-formed abscess. This is likely an infected collection. Post transmetatarsal amputation changes in the fifth ray as above. Degenerative changes in the remainder of the forefoot. Electronically signed by: Norleen Satchel MD 10/17/2024 09:17 AM EST RP Workstation: [id number]   DG Foot 2 Views Left Result Date: 10/15/2024 CLINICAL DATA:  Infection EXAM: LEFT FOOT - 2 VIEW COMPARISON:  Left foot x-ray 09/26/2024 FINDINGS: There is soft tissue swelling of the anterior  foot and toes. No radiopaque foreign body identified. There are increasing lucencies within the distal second metatarsal. Chronic deformity of the distal fifth metatarsal with periosteal reaction appears unchanged. Difficult to evaluate the third and fourth metatarsophalangeal joint secondary to patient positioning. Plantar calcaneal spur identified. No acute fracture or dislocation identified. IMPRESSION: 1. Increasing lucencies within the distal second metatarsal concerning for osteomyelitis. 2. Chronic deformity of the distal fifth metatarsal with periosteal reaction appears unchanged. 3. Soft tissue swelling of the anterior foot and toes. Electronically Signed   By:  Greig Pique M.D.   On: 10/15/2024 17:15    Assessment:   Jawanza Zambito is a 58 y.o. male with complicated infectious history as above now with septic R knee and TMA site infection. The knee has been tapped before but cxs neg which I suspect was due to being on multiple antibiotic courses at that time. Now s/p Knee washout and TMA debridement.   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  OPAT note in place.   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  For now will cover broadly with the dapto and meroemen. If the CR pseudomonas grows again can change based on results (last time was cipro  S)  Thank you very much for allowing me to participate in the care of this patient. Please call with questions.   Alm SQUIBB. Epifanio, MD

## 2024-11-07 NOTE — Inpatient Diabetes Management (Addendum)
 Inpatient Diabetes Program Recommendations  AACE/ADA: New Consensus Statement on Inpatient Glycemic Control (2015)  Target Ranges:  Prepandial:   less than 140 mg/dL      Peak postprandial:   less than 180 mg/dL (1-2 hours)      Critically ill patients:  140 - 180 mg/dL    Latest Reference Range & Units 11/06/24 08:07 11/06/24 12:32 11/06/24 17:23 11/06/24 18:40 11/06/24 21:47  Glucose-Capillary 70 - 99 mg/dL 885 (H) 894 (H) 882 (H)  5 mg Decadron  @1555  169 (H) 365 (H)  5 units Novolog   (H): Data is abnormally high  Latest Reference Range & Units 11/07/24 08:15  Glucose-Capillary 70 - 99 mg/dL 649 (H)  11 units Novolog    (H): Data is abnormally high      Home DM Meds:  Semglee  40 units at HS  Novolog  0-15 TID per SSI  Metformin  1000 mg BID  Dexcom G6 CGM   Current Orders:  Novolog  0-15 units TID ac/hs.      Pt got 5 mg Decadron  X 1 dose yest at 4pm for surgery CBGs OK during the day but severely elevated at bedtime likely due to the Decadron  Pt takes Semglee  insulin  at home  MD- Please consider starting Semglee  30 units daily (75% home dose)    --Will follow patient during hospitalization--  Adina Rudolpho Arrow RN, MSN, CDCES Diabetes Coordinator Inpatient Glycemic Control Team Team Pager: [phone] (8a-5p)       --Will follow patient during hospitalization--  Adina Rudolpho Arrow RN, MSN, CDCES Diabetes Coordinator Inpatient Glycemic Control Team Team Pager: [phone] (8a-5p)

## 2024-11-07 NOTE — Consult Note (Addendum)
 WOC Nurse Consult Note: Reason for Consult: Consult requested to perform Vac change.  Discussed via Secure chat with podiatry team and case management.  Pt is planning to D/C to a rehab facility but they do not use the same brand of negative pressure wound therapy device and the tubing is not compatible.  Since the date and time of the discharge is unknown at this time, I reapplied the Vac dressing.  Pt was medicated for pain prior to the procedure and tolerated with minimal amt discomfort.  Full thickness post-op wound to left anterior foot is red and moist with sutures surrounding, 5X6X1.2cm.  White macerated wound edges. Small amt pink drainage in the cannister.   Dressing procedure/placement/frequency: Applied one piece black foam to cont suction.  Applied ace wrap over the dressing. Topical treatment orders provided for bedside nurses to perform as follows:  BEDSIDE NURSE; PLEASE REMOVE VAC DRESSING PRIOR TO DISCHARGE AND APPLY MOIST GAUZE AND KERLEX FOR TRANSFER AND REHAB FACILITY WILL APPLY THEIR BRAND OF VAC UPON ARRIVAL.  WOC team will change again on  Tues if patient is still in the hospital at that time.   Thank-you,   Stephane Fought MSN, RN, CWOCN, CWCN-AP, CNS Contact Mon-Fri 0700-1500: [phone]

## 2024-11-07 NOTE — Progress Notes (Signed)
 Peripherally Inserted Central Catheter Placement  The IV Nurse has discussed with the patient and/or persons authorized to consent for the patient, the purpose of this procedure and the potential benefits and risks involved with this procedure.  The benefits include less needle sticks, lab draws from the catheter, and the patient may be discharged home with the catheter. Risks include, but not limited to, infection, bleeding, blood clot (thrombus formation), and puncture of an artery; nerve damage and irregular heartbeat and possibility to perform a PICC exchange if needed/ordered by physician.  Alternatives to this procedure were also discussed.  Bard Power PICC patient education guide, fact sheet on infection prevention and patient information card has been provided to patient /or left at bedside.    PICC Placement Documentation  PICC Single Lumen 11/07/24 Right Brachial 38 cm 0 cm (Active)  Indication for Insertion or Continuance of Line Prolonged intravenous therapies 11/07/24 1701  Exposed Catheter (cm) 0 cm 11/07/24 1701  Site Assessment Clean, Dry, Intact 11/07/24 1701  Line Status Flushed;Saline locked;Blood return noted 11/07/24 1701  Dressing Type Transparent;Securing device 11/07/24 1701  Dressing Status Antimicrobial disc/dressing in place;Clean, Dry, Intact 11/07/24 1701  Line Care Connections checked and tightened 11/07/24 1701  Line Adjustment (NICU/IV Team Only) No 11/07/24 1701  Dressing Intervention New dressing;Adhesive placed at insertion site (IV team only) 11/07/24 1701  Dressing Change Due 11/14/24 11/07/24 1701       [name] 11/07/2024, 5:02 PM

## 2024-11-07 NOTE — Progress Notes (Addendum)
 Infectious Disease Long Term IV Antibiotic Orders Bryen Hinderman 03/15/1966  Diagnosis: Septic knee, TMA site infection   Culture results Pending  LABS Lab Results  Component Value Date   CREATININE 1.29 (H) 11/07/2024   Lab Results  Component Value Date   WBC 7.5 11/07/2024   HGB 7.9 (L) 11/07/2024   HCT 25.0 (L) 11/07/2024   MCV 75.8 (L) 11/07/2024   PLT 403 (H) 11/07/2024   Lab Results  Component Value Date   ESRSEDRATE 106 (H) 10/19/2024   Lab Results  Component Value Date   CRP 10.4 (H) 10/19/2024    Allergies: No Known Allergies  Discharge antibiotics Meropenem   1 mg every  8 hours Daptomycin  700 mg IV q 24 hours  PICC Care per protocol Labs weekly while on IV antibiotics -FAX weekly labs to [phone]  CBC w diff   Comprehensive met panel CK  CRP   Planned duration of antibiotics 6 weeks  Stop date Jan 15th  Follow up clinic date TBD - will sched with Dr Gordy Alm SHAUNNA Epifanio, MD

## 2024-11-07 NOTE — Progress Notes (Signed)
 PHARMACY CONSULT NOTE FOR:  OUTPATIENT  PARENTERAL ANTIBIOTIC THERAPY (OPAT)  Indication: septic knee, TMA site infection Regimen:  Meropenem  1 mg IV q8h Daptomycin  700 mg IV q24h End date: 12/18/24  CBC w diff         Comprehensive met panel CK        CRP      FAX weekly labs to [phone]  Please remove PICC when antibiotic course is completed.  IV antibiotic discharge orders are pended. To discharging provider:  please sign these orders via discharge navigator,  Select New Orders & click on the button choice - Manage This Unsigned Work.     Thank you for allowing pharmacy to be a part of this patient's care.  Leonor BROCKS Trennon Torbeck 11/07/2024, 3:57 PM

## 2024-11-07 NOTE — Progress Notes (Signed)
 [facility]  - [place] [facility]   PATIENT NAME: [name]    MR#:  [id number]  DATE OF BIRTH:  10-01-1966  SUBJECTIVE:  no family at bedside. Patient seen earlier. Underwent left foot and right knee surgery by podiatry and orthopedic respectively. Patient is wondering what the next plan is going to be.    VITALS:  Blood pressure (!) 144/87, pulse 79, temperature 97.6 F (36.4 C), resp. rate 17, height 5' 9 (1.753 m), weight 113.1 kg, SpO2 100%.  PHYSICAL EXAMINATION:   GENERAL:  58 y.o.-year-old patient with no acute distress.  LUNGS: Normal breath sounds bilaterally, no wheezing CARDIOVASCULAR: S1, S2 normal. No murmur   ABDOMEN: Soft, nontender, nondistended. Bowel sounds present.  EXTREMITIES: left foot Wound vac and right knee dressing+ Post op 12/525    NEUROLOGIC: nonfocal  patient is alert and awake SKIN: as above  LABORATORY PANEL:  CBC Recent Labs  Lab 11/07/24 0612  WBC 7.5  HGB 7.9*  HCT 25.0*  PLT 403*    Chemistries  Recent Labs  Lab 11/07/24 0612  NA 131*  K 4.9  CL 98  CO2 22  GLUCOSE 339*  BUN 25*  CREATININE 1.29*  CALCIUM  8.5*  MG 2.1     Assessment and Plan  [name] is a 58 year old male with history of insulin -dependent diabetes mellitus type 2, with diabetic foot ulcer, CKD 3A, hyperlipidemia, hypertension, cocaine abuse, presented to hospital with left foot wounds and right knee pain for several weeks with drainage which got worse for about a day.  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  Subsequently wound dehiscence was present.  Podiatry was consulted and patient underwent TMA with debridement on 11/04/2024.  On cefepime  and vancomycin .  During hospitalization patient also complained of right knee pain so orthopedics was consulted.  Joint aspiration showed possibility for knee joint infection so  orthopedics has recommended surgical intervention.  Physical therapy has seen the patient and plan for skilled nursing facility placement.   Chronic osteomyelitis of the left foot.   Diabetic foot ulcer.    --Status post irrigation and debridement of the left foot wound with wound VAC placement on 11/04/2024 by podiatry Dr Tanda -- patient will discharge with wound VAC to the facility.  Right knee pain with joint effusion/ septic arthritis.   --States that he did have right knee swelling for a month or so and had aspiration in the past without any infection states that he did have some inflammation but has not been improving and states that is limiting his mobility especially in the context of left foot surgery.   --MRI of the right knee showed large knee joint effusion with synovial enhancement with grade 3 oblique tear of the posterior horn of the medial meniscus.   --Orthopedics Dr Earnestine Blanch  underwent aspiration of the knee with findings of septic arthritis.pt is s/p right knee joint synovectomy -- appreciate infectious disease Dr. Larina input. Recommends IV Daptomycin  and Meropenem  --Discharge antibiotics Meropenem      1          mg every         8          hours Daptomycin  700 mg IV q 24 hours   PICC Care per protocol Labs weekly while on IV antibiotics -FAX weekly labs to [phone]  CBC w diff  Comprehensive met panel CK        CRP        Planned duration of antibiotics 6 weeks   Stop date        Jan 15th          Follow up clinic date TBD- will sched with Dr Gordy    Diabetes mellitus type 2.  Continue sliding scale insulin  Accu-Cheks diabetic diet.  Closely monitor blood glucose levels.   Class II obesity.  Body mass index is 36.82 kg/m.  Patient would benefit from ongoing weight loss as outpatient.   Deconditioning debility.  PT has recommended skilled nursing facility placement.   DVT prophylaxis: Will resume Lovenox  when okay with  orthopedics.  Procedures: right knee complete synovectomy, left TMA stump debridement with vac placement Family communication : none Consults : orthopedic, podiatry, infectious disease CODE STATUS: full DVT Prophylaxis : Lovenox  Level of care: Med-Surg Status is: Inpatient Remains inpatient appropriate because: IV antibiotics for right knee septic arthritis. Discharge likely tomorrow if remains stable to rehab  Patient to get PICC line placement today. Dr. Epifanio discussed with him he is agreeable    TOTAL TIME TAKING CARE OF THIS PATIENT: 40 minutes.  >50% time spent on counselling and coordination of care  Note: This dictation was prepared with Dragon dictation along with smaller phrase technology. Any transcriptional errors that result from this process are unintentional.  Leita Blanch M.D    [facility]   CC: Primary care physician; Newlin, Enobong, MD

## 2024-11-07 NOTE — Progress Notes (Signed)
  Subjective: 1 Day Post-Op Procedure(s) (LRB): ARTHROSCOPY, KNEE (Right) Patient reports pain as mild.   Patient is well, and has had no acute complaints or problems Plan is to go Home after hospital stay. Negative for chest pain and shortness of breath Fever: no Gastrointestinal: Negative for nausea and vomiting  Objective: Vital signs in last 24 hours: Temp:  [97.1 F (36.2 C)-98.3 F (36.8 C)] 98.3 F (36.8 C) (12/05 0224) Pulse Rate:  [74-88] 78 (12/05 0224) Resp:  [14-26] 18 (12/05 0224) BP: (121-151)/(68-97) 130/68 (12/05 0224) SpO2:  [97 %-100 %] 98 % (12/05 0224)  Intake/Output from previous day:  Intake/Output Summary (Last 24 hours) at 11/07/2024 0658 Last data filed at 11/07/2024 0201 Gross per 24 hour  Intake 400 ml  Output 1402 ml  Net -1002 ml    Intake/Output this shift: Total I/O In: -  Out: 600 [Urine:600]  Labs: Recent Labs    11/05/24 0546 11/06/24 0524 11/07/24 0612  HGB 7.5* 7.5* 7.9*   Recent Labs    11/06/24 0524 11/07/24 0612  WBC 7.0 7.5  RBC 3.10* 3.30*  HCT 24.6* 25.0*  PLT 364 403*   Recent Labs    11/05/24 0546 11/06/24 0524  NA 135 133*  K 4.6 4.6  CL 101 101  CO2 25 24  BUN 21* 20  CREATININE 1.35* 1.13  GLUCOSE 149* 138*  CALCIUM  8.4* 8.5*   No results for input(s): LABPT, INR in the last 72 hours.   EXAM General - Patient is Alert and Oriented Extremity - Neurovascular intact Dorsiflexion/Plantar flexion intact Compartment soft Dressing/Incision - clean, dry, with a Hemovac intact.  He had unattached the Hemovac tubing from the surgical tubing.  It was reattached with pressure reapplied.  20 cc out earlier. Motor Function - intact, moving foot and toes well on exam.   Past Medical History:  Diagnosis Date   Amputation of right great toe    Cellulitis and abscess of foot    CKD stage 3a, GFR 45-59 ml/min (HCC)    Cocaine abuse (HCC)    + UDS on 05-12-24   DM (diabetes mellitus), type 2 (HCC)    ESBL  (extended spectrum beta-lactamase) producing bacteria infection 04/18/2024   Hyperlipidemia    Hypertension    Methicillin resistant Staphylococcus aureus infection 07/14/2024   MRSA bacteremia    Normocytic anemia    Obesity    Tobacco abuse    Toe osteomyelitis, left (HCC)    Tuberculosis    tested positive,treated with medications.    Assessment/Plan: 1 Day Post-Op Procedure(s) (LRB): ARTHROSCOPY, KNEE (Right) Principal Problem:   Wound infection Active Problems:   Diabetic foot ulcer (HCC)   Obesity (BMI 30-39.9)   Tobacco abuse   Right knee pain  Estimated body mass index is 36.82 kg/m as calculated from the following:   Height as of this encounter: 5' 9 (1.753 m).   Weight as of this encounter: 113.1 kg. Advance diet Up with therapy  DVT Prophylaxis - Lovenox  Weight-Bearing as tolerated to right leg  Krystal Doyne, PA-C Orthopaedic Surgery 11/07/2024, 6:58 AM

## 2024-11-07 NOTE — Discharge Instructions (Addendum)
 Podiatry (Foot and Ankle) Hospital Discharge Instructions:  WOUND CARE / DRESSINGS:  Wound vac change instructions  Frequency: Every Mon-Wed-Fri (or 3x/week) First occurrence on Fri 11/07/24 Until Specified  Please place adaptic (clear mesh) overlying the deep space structures as well as the incision sites. The incision sites are all bridged with black sponge to assist with closure. Amount of suction: 125 mm/Hg  Suction Type: Continuous   ACTIVITY: NON-WEIGHTBEARING on the left lower extremity   ANTIBIOTICS: If you were prescribed antibiotics on discharge, please take as instructed until the whole course is finished.   FOLLOW UP: Please make an appointment for 1 week after discharge. If there are any questions or issues in reference to your lower extremity care that arise in interim of your next appointment, please call the clinic for assistance.    [facility]: [address] [place], [place] [address] Phone: [phone] Hours:Mon - Friday: 8:00am - 5:00pm                [facility] orthopedic discharge instructions: Follow-up with [facility] orthopedics in 2 weeks for suture removal Keep compressive dressing over the knee until 11/13/2024.  11/13/2024 patient can remove dressing and leave open to air and begin showering.

## 2024-11-08 DIAGNOSIS — G8929 Other chronic pain: Secondary | ICD-10-CM

## 2024-11-08 DIAGNOSIS — E669 Obesity, unspecified: Secondary | ICD-10-CM

## 2024-11-08 DIAGNOSIS — M25561 Pain in right knee: Secondary | ICD-10-CM

## 2024-11-08 LAB — GLUCOSE, CAPILLARY
Glucose-Capillary: 249 mg/dL — ABNORMAL HIGH (ref 70–99)
Glucose-Capillary: 239 mg/dL — ABNORMAL HIGH (ref 70–99)

## 2024-11-08 LAB — BODY FLUID CULTURE W GRAM STAIN: Culture: NO GROWTH

## 2024-11-08 LAB — SEDIMENTATION RATE: Sed Rate: 125 mm/h — ABNORMAL HIGH (ref 0–20)

## 2024-11-08 MED ORDER — MEROPENEM IV (FOR PTA / DISCHARGE USE ONLY): 1.0000 g | Freq: Three times a day (TID) | INTRAVENOUS | 0 refills | Status: AC

## 2024-11-08 MED ORDER — OXYCODONE-ACETAMINOPHEN 5-325 MG PO TABS: 1.0000 | ORAL_TABLET | ORAL | 0 refills | Status: AC | PRN

## 2024-11-08 MED ORDER — DICLOFENAC SODIUM 1 % EX GEL: 4.0000 g | Freq: Four times a day (QID) | CUTANEOUS | 0 refills | Status: AC

## 2024-11-08 MED ORDER — NICOTINE 21 MG/24HR TD PT24: 21.0000 mg | MEDICATED_PATCH | Freq: Every day | TRANSDERMAL | 0 refills | Status: AC | PRN

## 2024-11-08 MED ORDER — DAPTOMYCIN IV (FOR PTA / DISCHARGE USE ONLY): 700.0000 mg | INTRAVENOUS | 0 refills | Status: AC

## 2024-11-08 NOTE — TOC Transition Note (Addendum)
 Transition of Care [facility]) - Discharge Note   Patient Details  Name: [name] MRN: [id number] Date of Birth: Feb 06, 1966  Transition of Care [facility]) CM/SW Contact:  Laverna Dossett L Shaft Corigliano, LCSW Phone Number: 11/08/2024, 10:54 AM   Clinical Narrative:     Patient medically stable for discharge. CSW contacted [facility] to advise that DC summary was in. Patient will return to room 302. RN provided number to call report.   CSW spoke with patient's brother, Koren, who advised that he can provide transportation.   No further TOC needs identified.         Patient Goals and CMS Choice            Discharge Placement                       Discharge Plan and Services Additional resources added to the After Visit Summary for                                       Social Drivers of Health (SDOH) Interventions SDOH Screenings   Food Insecurity: No Food Insecurity (11/03/2024)  Housing: Low Risk  (11/03/2024)  Transportation Needs: No Transportation Needs (11/03/2024)  Utilities: Not At Risk (11/03/2024)  Depression (PHQ2-9): Low Risk  (08/28/2023)  Financial Resource Strain: Patient Declined (06/28/2023)   Received from [facility]  Social Connections: Moderately Integrated (11/03/2024)  Tobacco Use: Medium Risk (11/06/2024)  Health Literacy: Adequate Health Literacy (05/01/2024)     Readmission Risk Interventions    11/04/2024    4:01 PM  Readmission Risk Prevention Plan  Transportation Screening Complete  Medication Review (RN Care Manager) Complete  PCP or Specialist appointment within 3-5 days of discharge Complete  HRI or Home Care Consult Not Complete  HRI or Home Care Consult Pt Refusal Comments Going to SNF  SW Recovery Care/Counseling Consult Complete  Palliative Care Screening Not Applicable  Skilled Nursing Facility Complete

## 2024-11-08 NOTE — Progress Notes (Signed)
   Subjective: 2 Days Post-Op Procedure(s) (LRB): ARTHROSCOPY, KNEE (Right) Patient reports pain as mild.  Pain much improved Patient is well, and has had no acute complaints or problems Denies any CP, SOB, ABD pain. We will continue therapy today.  Plan is to go Skilled nursing facility after hospital stay.  Objective: Vital signs in last 24 hours: Temp:  [97.5 F (36.4 C)-97.8 F (36.6 C)] 97.8 F (36.6 C) (12/06 0805) Pulse Rate:  [78-83] 78 (12/06 0805) Resp:  [17-18] 17 (12/06 0805) BP: (135-144)/(80-93) 141/93 (12/06 0805) SpO2:  [100 %] 100 % (12/06 0805)  Intake/Output from previous day: 12/05 0701 - 12/06 0700 In: 770 [P.O.:570; IV Piggyback:200] Out: 40 [Drains:40] Intake/Output this shift: No intake/output data recorded.  Recent Labs    11/06/24 0524 11/07/24 0612  HGB 7.5* 7.9*   Recent Labs    11/06/24 0524 11/07/24 0612  WBC 7.0 7.5  RBC 3.10* 3.30*  HCT 24.6* 25.0*  PLT 364 403*   Recent Labs    11/06/24 0524 11/07/24 0612  NA 133* 131*  K 4.6 4.9  CL 101 98  CO2 24 22  BUN 20 25*  CREATININE 1.13 1.29*  GLUCOSE 138* 339*  CALCIUM  8.5* 8.5*   No results for input(s): LABPT, INR in the last 72 hours.  EXAM General - Patient is Alert, Appropriate, and Oriented Extremity - Neurovascular intact Sensation intact distally Dorsiflexion/Plantar flexion intact Minimal swelling.  Incision sites are intact.  Hemovac removed.  Mild drainage from portal site.  New compressive dressing applied Dressing - dressing C/D/I and no drainage. Motor Function - intact, moving ankle well on exam  Past Medical History:  Diagnosis Date   Amputation of right great toe    Cellulitis and abscess of foot    CKD stage 3a, GFR 45-59 ml/min (HCC)    Cocaine abuse (HCC)    + UDS on 05-12-24   DM (diabetes mellitus), type 2 (HCC)    ESBL (extended spectrum beta-lactamase) producing bacteria infection 04/18/2024   Hyperlipidemia    Hypertension     Methicillin resistant Staphylococcus aureus infection 07/14/2024   MRSA bacteremia    Normocytic anemia    Obesity    Tobacco abuse    Toe osteomyelitis, left (HCC)    Tuberculosis    tested positive,treated with medications.    Assessment/Plan:   2 Days Post-Op Procedure(s) (LRB): ARTHROSCOPY, KNEE (Right) Principal Problem:   Wound infection Active Problems:   Diabetic foot ulcer (HCC)   Obesity (BMI 30-39.9)   Tobacco abuse   Right knee pain   Pyogenic arthritis of right knee joint (HCC)  Estimated body mass index is 36.82 kg/m as calculated from the following:   Height as of this encounter: 5' 9 (1.753 m).   Weight as of this encounter: 113.1 kg. Advance diet Up with therapy Pain well-controlled Labs and vital signs are stable Continue with antibiotics via PICC line per ID. Care management to assist with discharge to skilled nursing facility.   At time of discharge patient can take aspirin  325 mg daily for 2 weeks for DVT prophylaxis. Follow-up with [facility] orthopedics in 2 weeks for suture removal Keep compressive dressing over the knee until 11/13/2024.  11/13/2024 patient can remove dressing and leave open to air and begin showering.   DVT Prophylaxis - Lovenox     T. Medford Amber, PA-C [facility] 11/08/2024, 8:53 AM

## 2024-11-08 NOTE — Plan of Care (Signed)

## 2024-11-08 NOTE — Care Plan (Addendum)
 Called report to [phone]  Report given to [facility] RN  Patient waiting on brother to tranport, see CM note for details.  Per Tobie MD order, wound vac to be removed in the hospital, and replaced at the facility (see note) Signed RX sent with patient  PICC line intact, pt took all belongings.

## 2024-11-08 NOTE — Plan of Care (Signed)
   Problem: Education: Goal: Ability to describe self-care measures that may prevent or decrease complications (Diabetes Survival Skills Education) will improve Outcome: Progressing

## 2024-11-08 NOTE — Discharge Summary (Signed)
 Physician Discharge Summary   Patient: [name] MRN: [id number] DOB: 11-26-1966  Admit date:     11/03/2024  Discharge date: 11/08/24  Discharge Physician: Leita Blanch   PCP: [name], MD   Recommendations at discharge:    F/u Ortho Dr Earnestine Blanch in 1-2 weeks F/u Podiatry Dr Tanda in 1-3 weeks F/u PCP in 1-2 weeks  Discharge Diagnoses: Principal Problem:   Wound infection Active Problems:   Diabetic foot ulcer (HCC)   Tobacco abuse   Obesity (BMI 30-39.9)   Right knee pain   Pyogenic arthritis of right knee joint (HCC)  [name] is a 58 year old male with history of insulin -dependent diabetes mellitus type 2, with diabetic foot ulcer, CKD 3A, hyperlipidemia, hypertension, cocaine abuse, presented to hospital with left foot wounds and right knee pain for several weeks with drainage which got worse for about a day.  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  Subsequently wound dehiscence was present.  Podiatry was consulted and patient underwent TMA with debridement on 11/04/2024.  On cefepime  and vancomycin .  During hospitalization patient also complained of right knee pain so orthopedics was consulted.  Joint aspiration showed possibility for knee joint infection so orthopedics has recommended surgical intervention.  Physical therapy has seen the patient and plan for skilled nursing facility placement.    Chronic osteomyelitis of the left foot.   Diabetic foot ulcer.   --Status post irrigation and debridement of the left foot wound with wound VAC placement on 11/04/2024 by podiatry Dr Tanda -- patient will discharge with wound VAC to the facility. --Below broad spectrum abxs should cover   Right knee pain with joint effusion/ septic arthritis with OM tibia --States that he did have right knee swelling for a month or so and had aspiration in the past without any  infection states that he did have some inflammation but has not been improving and states that is limiting his mobility especially in the context of left foot surgery.   --MRI of the right knee showed large knee joint effusion with synovial enhancement with grade 3 oblique tear of the posterior horn of the medial meniscus.   --Orthopedics Dr Earnestine Blanch  underwent aspiration of the knee with findings of septic arthritis.pt is s/p right knee joint synovectomy -- appreciate infectious disease Dr. Larina input. Recommends IV Daptomycin  and Meropenem  --Discharge antibiotics Meropenem      1          mg every         8          hours Daptomycin  700 mg IV q 24 hours   PICC Care per protocol Labs weekly while on IV antibiotics -FAX weekly labs to [phone]  CBC w diff         Comprehensive met panel CK        CRP        Planned duration of antibiotics 6 weeks   Stop date        Jan 15th          Follow up clinic date TBD- will sched with Dr Gordy     Diabetes mellitus type 2 with Neuropathy, PAD --  Continue sliding scale insulin  Accu-Cheks diabetic diet.   --resumed home dose insulin  since sugars rising --holding metformin  for now--creat 1.29  HTN --resume losartan  --watch K   Class II obesity.  Body mass index is  36.82 kg/m.  Patient would benefit from ongoing weight loss as outpatient.   Deconditioning debility.  PT has recommended skilled nursing facility placement.   DVT prophylaxis: Will resume Lovenox  when okay with orthopedics.   Procedures: right knee complete synovectomy, left TMA stump debridement with vac placement Family communication : none Consults : orthopedic, podiatry, infectious disease CODE STATUS: full DVT Prophylaxis : Lovenox      Pain control - [place]  Controlled Substance Reporting System database was reviewed. and patient was instructed, not to drive, operate heavy machinery, perform activities at heights, swimming or participation in water  activities or provide baby-sitting services while on Pain, Sleep and Anxiety Medications; until their outpatient Physician has advised to do so again. Also recommended to not to take more than prescribed Pain, Sleep and Anxiety Medications.  Disposition: Rehabilitation facility Diet recommendation:  Discharge Diet Orders (From admission, onward)     Start     Ordered   11/08/24 0000  Diet - low sodium heart healthy        11/08/24 0928           Cardiac and Carb modified diet DISCHARGE MEDICATION: Allergies as of 11/08/2024   No Known Allergies      Medication List     PAUSE taking these medications    metFORMIN  500 MG 24 hr tablet Wait to take this until your doctor or other care provider tells you to start again. Commonly known as: GLUCOPHAGE -XR Take 2 tablets (1,000 mg total) by mouth 2 (two) times daily with a meal.       STOP taking these medications    amoxicillin -clavulanate 875-125 MG tablet Commonly known as: AUGMENTIN    ciprofloxacin  500 MG tablet Commonly known as: CIPRO    lactobacillus acidophilus & bulgar chewable tablet   oxyCODONE -acetaminophen  10-325 MG tablet Commonly known as: PERCOCET Replaced by: oxyCODONE -acetaminophen  5-325 MG tablet       TAKE these medications    Accu-Chek Guide Test test strip Generic drug: glucose blood Use to check blood sugar 3 times daily.   bisacodyl  5 MG EC tablet Generic drug: bisacodyl  Take 2 tablets (10 mg total) by mouth at bedtime as needed for moderate constipation.   daptomycin  IVPB Commonly known as: CUBICIN  Inject 700 mg into the vein daily. Indication:  septic knee, TMA site infection Last Day of Therapy:  12/18/2024 Labs - Once weekly:  CBC/D, CMP, and CPK Labs - Once weekly: CRP FAX weekly labs to [phone]  Method of administration: IV Push Method of administration may be changed at the discretion of facility  Please remove PICC after antibiotic course is completed.   Dexcom G6  Transmitter Misc Use to check blood sugar three times daily. Change transmitter once every 909 days. E11.69   diclofenac  Sodium 1 % Gel Commonly known as: VOLTAREN  Apply 4 g topically 4 (four) times daily.   gabapentin  300 MG capsule Commonly known as: NEURONTIN  Take 1 capsule (300 mg total) by mouth 2 (two) times daily for neuropathy pain.   insulin  glargine-yfgn 100 UNIT/ML injection Commonly known as: SEMGLEE  Inject 0.4 mLs (40 Units total) into the skin daily at 10 pm.   losartan  25 MG tablet Commonly known as: COZAAR  Take 0.5 tablets (12.5 mg total) by mouth daily.   meropenem  IVPB Commonly known as: MERREM  Inject 1 g into the vein every 8 (eight) hours. Indication:  septic knee, TMA site infection Last Day of Therapy:  12/18/2024 Labs - Once weekly:  CBC/D, CMP, and CPK Labs - Once  weekly: CRP FAX weekly labs to [phone]  Method of administration: IV Push Method of administration may be changed at the discretion of facility  Please remove PICC after antibiotic course is completed.   naloxone 4 MG/0.1ML Liqd nasal spray kit Commonly known as: NARCAN Place 0.4 mg into the nose once.   nicotine  21 mg/24hr patch Commonly known as: NICODERM CQ  - dosed in mg/24 hours Place 1 patch (21 mg total) onto the skin daily as needed (nicotine  craving).   NovoLOG  FlexPen 100 UNIT/ML FlexPen Generic drug: insulin  aspart Inject 0-15 Units into the skin 3 (three) times daily before meals.  Correction coverage: Moderate (average weight, post-op) CBG 70 - 120: 0 units CBG 121 - 150: 2 units CBG 151 - 200: 3 units CBG 201 - 250: 5 units CBG 251 - 300: 8 units CBG 301 - 350: 11 units CBG 351 - 400: 15 units CBG > 400: call MD   oxyCODONE -acetaminophen  5-325 MG tablet Commonly known as: PERCOCET/ROXICET Take 1-2 tablets by mouth every 4 (four) hours as needed for moderate pain (pain score 4-6) or severe pain (pain score 7-10). Replaces: oxyCODONE -acetaminophen  10-325 MG  tablet   polyethylene glycol 17 g packet Commonly known as: MIRALAX  / GLYCOLAX  Mix 1 packet (17 g) according to package instructions and take by mouth 2 (two) times daily.   senna 8.6 MG Tabs tablet Commonly known as: SENOKOT Take 1 tablet (8.6 mg total) by mouth 2 (two) times daily.   TechLite Pen Needles 32G X 4 MM Misc Generic drug: Insulin  Pen Needle Use as directed to inject insulin  up to 4 times daily.   Vitamin D3 1.25 MG (50000 UT) Caps Take 1 capsule (50,000 Units total) by mouth every Monday.               Discharge Care Instructions  (From admission, onward)           Start     Ordered   11/08/24 0000  Change dressing on IV access line weekly and PRN  (Home infusion instructions - [facility] )        11/08/24 0927   11/08/24 0000  Discharge wound care:       Comments: 11/11/24 1000    Negative pressure wound therapy  Every Tues & Fri 10:00    Comments: Please place adaptic (clear mesh) overlying the deep space structures as well as the incision sites. The incision sites are all bridged with black sponge to assist with closure. BEDSIDE NURSE; PLEASE REMOVE VAC DRESSING PRIOR TO DISCHARGE AND APPLY MOIST GAUZE AND KERLEX FOR TRANSFER AND REHAB FACILITY WILL APPLY THEIR BRAND OF VAC UPON ARRIVAL. Question Answer Comment Amount of suction? 125 mm/Hg   Suction Type? Continuous     11/07/24 1509    11/08/24 0500    Wound care  Daily      Comments: BEDSIDE NURSE; PLEASE REMOVE VAC DRESSING PRIOR TO DISCHARGE AND APPLY MOIST GAUZE AND KERLEX FOR TRANSFER AND REHAB FACILITY WILL APPLY THEIR BRAND OF VAC UPON ARRIVAL.  11/07/24 1509   11/08/24 0928            Contact information for follow-up providers     Verlinda Boas, PA-C Follow up in 2 week(s).   Specialty: Orthopedic Surgery Contact information: [address] [place] [address] [phone]         Tanda Greig MATSU, DPM. Call in 1 week(s).    Specialty: Podiatry Contact information: [phone]  [address] [place] [address] [phone]         [name], MD. Schedule an appointment as soon as possible for a visit in 1 week(s).   Specialty: Family Medicine Contact information: [address] [place] [address] [place] [address] [phone]              Contact information for after-discharge care     Destination     [facility] .   Service: Skilled Nursing Contact information: [address] [place]  [address] [phone]                    Discharge Exam: Fredricka Weights   11/03/24 1615 11/03/24 2026  Weight: 102.1 kg 113.1 kg   GENERAL:  58 y.o.-year-old patient with no acute distress.  LUNGS: Normal breath sounds bilaterally, no wheezing CARDIOVASCULAR: S1, S2 normal. No murmur   ABDOMEN: Soft, nontender, nondistended. Bowel sounds present.  EXTREMITIES: left foot Wound vac and right knee dressing+ Post op 12/525    NEUROLOGIC: nonfocal  patient is alert and awake SKIN: as above  Condition at discharge: fair  The results of significant diagnostics from this hospitalization (including imaging, microbiology, ancillary and laboratory) are listed below for reference.   Imaging Studies: US  EKG SITE RITE Result Date: 11/07/2024 If Site Rite image not attached, placement could not be confirmed due to current cardiac rhythm.  MR KNEE RIGHT W WO CONTRAST Result Date: 11/05/2024 EXAM: MRI of the right Knee With and Without Contrast. 11/05/2024 01:32:47 AM TECHNIQUE: Multiplanar multisequence MRI of the right knee was performed with and without intravenous contrast. CONTRAST: 10 mL of Gadavist . COMPARISON: MR Knee 09/28/2024. CLINICAL HISTORY: Chronic knee pain and swelling. FINDINGS: MEDIAL MENISCUS: Small grade 3 oblique tear of the posterior horn medial meniscus extends to the inferior meniscal surface as on images 22 through 25 series 12.  LATERAL MENISCUS: Intact lateral meniscus. ANTERIOR CRUCIATE LIGAMENT: Intact anterior cruciate ligament. POSTERIOR CRUCIATE LIGAMENT: Intact posterior cruciate ligament. EXTENSOR MECHANISM: Intact quadriceps and patellar tendons. Intact patellar retinacula. LATERAL COLLATERAL LIGAMENT COMPLEX: Intact IT band, lateral collateral ligament proper, biceps femoris tendon and popliteus tendon. MEDIAL COLLATERAL LIGAMENT COMPLEX: The superficial and deep components of the medial collateral ligament are intact. KNEE JOINT: Mild degenerative chondral thinning in the medial compartment. Large knee joint effusion with relatively thin synovial enhancement. The effusion has increased in volume compared to the 09/28/2024 examination. Large amount of fluid in the suprapatellar bursa. Fluid extends down in the popliteus recess. Small baker cyst. BONE MARROW: Progressive marrow edema along the tibial spine, etiology uncertain. Mild progressive marrow edema below the medial patellar facet. SOFT TISSUE: Infiltrative edema in the popliteal space. Enlarged lymph nodes in the upper popliteal region and along the distal femoral artery up to 1.2 cm in short axis on image 17 series 12, probably reactive lymph nodes. Edema signal in the popliteus muscle, cannot exclude muscle strain or tear. Mild edema along the distal vastus medialis muscle posteriorly. Prepatellar and anterior subcutaneous edema along the knee. IMPRESSION: 1. Large knee joint effusion with thin synovial enhancement, increased in volume compared to the prior examination, with large suprapatellar bursal fluid and fluid extending into the popliteus recess and a small Baker cyst. Given the progressive volume, likely reactive adenopathy, new marrow edema along the tibial spine, septic arthritis is not entirely excluded and arthrocentesis may be indicated. 2. Small grade 3 oblique tear of the posterior horn medial meniscus extending to the inferior  meniscal surface. 3. Mild  degenerative chondral thinning in the medial compartment. 4. Edema signal in the popliteus muscle, cannot exclude strain or tear, with mild edema along the distal vastus medialis muscle posteriorly. 5. Progressive marrow edema along the tibial spine, etiology uncertain, with mild progressive marrow edema below the medial patellar facet. 6. Enlarged lymph nodes in the upper popliteal region and along the distal femoral artery, likely reactive. Electronically signed by: Ryan Salvage MD 11/05/2024 09:41 AM EST RP Workstation: [id number]   DG Foot 2 Views Left Result Date: 11/03/2024 EXAM: 2 VIEW(S) XRAY OF THE LEFT FOOT 11/03/2024 05:45:00 PM COMPARISON: 10/23/2024 CLINICAL HISTORY: infection FINDINGS: BONES AND JOINTS: Suspect developing lucency within the distal most 2nd metatarsal. Periosteal reaction about the 5th metatarsal shaft is unchanged. No acute fracture. No joint dislocation. SOFT TISSUES: Soft tissue defect distal to the remaining 2nd metatarsal. Soft tissue gas and overlying bandaged material. IMPRESSION: 1. Soft tissue ulcer distal to the 2nd metatarsal with suggestion of osteomyelitis within the distal most 2nd metatarsal shaft. Consider further evaluation with pre and postcontrast MRI. 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 Similar appearance of periosteal reaction in residual fifth metatarsal. No joint dislocation. SOFT TISSUES: Vascular calcifications. Soft tissue swelling with air in soft tissues. IMPRESSION: 1. 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 2. Soft tissue swelling with soft tissue gas compatible with postoperative change . Electronically signed by: Waddell Calk MD 10/23/2024 02:15 PM EST RP Workstation: [id number]   DG MINI C-ARM IMAGE ONLY Result Date: 10/17/2024 There is no interpretation for this exam.  This order is for images obtained during a surgical procedure.  Please See Surgeries Tab for more information regarding the procedure.   MR FOOT LEFT W WO CONTRAST Result Date: 10/17/2024 MR FOOT WITHOUT THEN WITH IV CONTRAST LEFT COMPARISON: None. CLINICAL HISTORY: Wound, osteomyelitis. PULSE SEQUENCES: Ax T1, Ax T2 FS, Sag T1, Sag T2 FS, Cor STIR, Cor T1 with and without IV contrast. FINDINGS: Bones: There is a large wound in the plantar aspect of the foot which communicates with the second metatarsal head and third metatarsal head. There is destruction of the second and third metatarsal heads moderate reactive edema. There is also destruction proximal phalanx second toe consistent with osteomyelitis. Fluid surrounding the second metatarsal head likely poorly defined abscess. No significant collection is identified. Degenerative changes are seen in the first ray with a mild hallux valgus moderate degenerative arthrosis third metatarsophalangeal joint. The fifth distal metatarsal has the with sparing of the fifth toe. Musculotendinous structures: Extensor tendons are grossly intact. The flexor tendons are poorly defined. The flexor tendon of the second and third toes are not seen. This may be related to chronic disruption. Mild cellulitis is present. IMPRESSION: Plantar surface wound which communicates with the second metatarsal head as above. There is destruction of the second metatarsal head and second proximal phalanx. There is remodeling of the third metatarsal head. Findings are consistent with osteomyelitis. There is fluid surrounding the second metatarsal head without evidence of a well-formed abscess. This is likely an infected collection. Post transmetatarsal amputation changes in the fifth ray as above. Degenerative changes in the remainder of  the forefoot. Electronically signed by: Norleen Satchel MD 10/17/2024 09:17 AM EST  RP Workstation: [id number]   DG Foot 2 Views Left Result Date: 10/15/2024 CLINICAL DATA:  Infection EXAM: LEFT FOOT - 2 VIEW COMPARISON:  Left foot x-ray 09/26/2024 FINDINGS: There is soft tissue swelling of the anterior foot and toes. No radiopaque foreign body identified. There are increasing lucencies within the distal second metatarsal. Chronic deformity of the distal fifth metatarsal with periosteal reaction appears unchanged. Difficult to evaluate the third and fourth metatarsophalangeal joint secondary to patient positioning. Plantar calcaneal spur identified. No acute fracture or dislocation identified. IMPRESSION: 1. Increasing lucencies within the distal second metatarsal concerning for osteomyelitis. 2. Chronic deformity of the distal fifth metatarsal with periosteal reaction appears unchanged. 3. Soft tissue swelling of the anterior foot and toes. Electronically Signed   By: Greig Pique M.D.   On: 10/15/2024 17:15    Microbiology: Results for orders placed or performed during the hospital encounter of 11/03/24  Aerobic/Anaerobic Culture w Gram Stain (surgical/deep wound)     Status: None (Preliminary result)   Collection Time: 11/03/24  7:56 PM   Specimen: Wound  Result Value Ref Range Status   Specimen Description   Final    WOUND Performed at [facility], [address], [place], [place] [address]    Special Requests   Final    NONE Performed at [facility], [address] [place], [place] [address]    Gram Stain   Final    NO WBC SEEN MODERATE GRAM POSITIVE COCCI RARE GRAM NEGATIVE RODS RARE GRAM POSITIVE RODS Performed at [facility], [address], [place], [place] [address]    Culture   Final    MODERATE ENTEROBACTER HORMAECHEI MODERATE CORYNEBACTERIUM SPECIES Standardized susceptibility testing for this organism is not available. MODERATE MORGANELLA  MORGANII NO ANAEROBES ISOLATED; CULTURE IN PROGRESS FOR 5 DAYS    Report Status PENDING  Incomplete   Organism ID, Bacteria MORGANELLA MORGANII  Final      Susceptibility   Morganella morganii - MIC*    AMPICILLIN  >=32 RESISTANT Resistant     ERTAPENEM  <=0.12 SENSITIVE Sensitive     CIPROFLOXACIN  2 RESISTANT Resistant     GENTAMICIN <=1 SENSITIVE Sensitive     MEROPENEM  <=0.25 SENSITIVE Sensitive     TRIMETH/SULFA >=320 RESISTANT Resistant     AMPICILLIN /SULBACTAM 16 INTERMEDIATE Intermediate     PIP/TAZO Value in next row Sensitive      <=4 SENSITIVEThis is a modified FDA-approved test that has been validated and its performance characteristics determined by the reporting laboratory.  This laboratory is certified under the Clinical Laboratory Improvement Amendments CLIA as qualified to perform high complexity clinical laboratory testing.    * MODERATE MORGANELLA MORGANII  MRSA Next Gen by PCR, Nasal     Status: None   Collection Time: 11/04/24  1:21 AM   Specimen: Nasal Mucosa; Nasal Swab  Result Value Ref Range Status   MRSA by PCR Next Gen NOT DETECTED NOT DETECTED Final    Comment: (NOTE) The GeneXpert MRSA Assay (FDA approved for NASAL specimens only), is one component of a comprehensive MRSA colonization surveillance program. It is not intended to diagnose MRSA infection nor to guide or monitor treatment for MRSA infections. Test performance is not FDA approved in patients less than 23 years old. Performed at [facility], [address] [place], [place] [address]   Body fluid culture w Gram Stain     Status: None (Preliminary result)   Collection Time: 11/05/24  1:10 PM   Specimen:  Synovium; Body Fluid  Result Value Ref Range Status   Specimen Description   Final    SYNOVIAL Performed at [facility], [address] [place], [place] [address]    Special Requests   Final    RIGHT KNEE Performed at [facility], [address]  [place], [place] [address]    Gram Stain   Final    ABUNDANT WBC PRESENT, PREDOMINANTLY PMN NO ORGANISMS SEEN    Culture   Final    NO GROWTH 2 DAYS Performed at [facility], [address] [place], [place] [address]    Report Status PENDING  Incomplete  Aerobic/Anaerobic Culture w Gram Stain (surgical/deep wound)     Status: None (Preliminary result)   Collection Time: 11/06/24  4:27 PM   Specimen: Wound; Body Fluid  Result Value Ref Range Status   Specimen Description   Final    WOUND Performed at [facility], [address] [place], [place] [address]    Special Requests   Final    NONE Performed at [facility], [address] [place], [place] [address]    Gram Stain   Final    ABUNDANT WBC PRESENT, PREDOMINANTLY PMN NO ORGANISMS SEEN Performed at [facility], [address] [place], [place] [address]    Culture PENDING  Incomplete   Report Status PENDING  Incomplete    Labs: CBC: Recent Labs  Lab 11/03/24 1621 11/05/24 0546 11/06/24 0524 11/07/24 0612  WBC 10.9* 7.4 7.0 7.5  HGB 8.3* 7.5* 7.5* 7.9*  HCT 27.5* 24.4* 24.6* 25.0*  MCV 79.5* 78.7* 79.4* 75.8*  PLT 426* 352 364 403*   Basic Metabolic Panel: Recent Labs  Lab 11/03/24 1621 11/05/24 0546 11/06/24 0524 11/07/24 0612  NA 133* 135 133* 131*  K 4.8 4.6 4.6 4.9  CL 99 101 101 98  CO2 23 25 24 22   GLUCOSE 84 149* 138* 339*  BUN 22* 21* 20 25*  CREATININE 1.51* 1.35* 1.13 1.29*  CALCIUM  8.8* 8.4* 8.5* 8.5*  MG  --   --  2.1 2.1   Liver Function Tests: No results for input(s): AST, ALT, ALKPHOS, BILITOT, PROT, ALBUMIN in the last 168 hours. CBG: Recent Labs  Lab 11/07/24 0815 11/07/24 1130 11/07/24 1718 11/07/24 2125 11/08/24 0804  GLUCAP 350* 271* 316* 290* 249*    Discharge time spent: greater than 30 minutes.  Signed: Leita Blanch, MD [facility] 11/08/2024

## 2024-11-08 NOTE — Care Plan (Signed)
 Patient was picked up to transport to facility by brother  Patient took all belongings including post - op shoes, wound vac removed per order. RN at facility made aware of the need of a new wound vac Patient did not take ICEMAN with him, claims he does not need it and asked to be thrown away  No other questions or concerns.

## 2024-11-09 LAB — AEROBIC/ANAEROBIC CULTURE W GRAM STAIN (SURGICAL/DEEP WOUND): Gram Stain: NONE SEEN

## 2024-11-11 LAB — AEROBIC/ANAEROBIC CULTURE W GRAM STAIN (SURGICAL/DEEP WOUND): Culture: NO GROWTH

## 2024-11-12 LAB — AEROBIC/ANAEROBIC CULTURE W GRAM STAIN (SURGICAL/DEEP WOUND)

## 2024-11-14 ENCOUNTER — Ambulatory Visit: Payer: Self-pay | Admitting: Family Medicine

## 2024-11-16 NOTE — Progress Notes (Signed)
 POST OP:  [name] is a 58 y.o. male with PMH for DM2 c/b peripheral neuropathy, CKD, presenting to clinic s/p discharge from hospital in which he underwent I&D + wound vac placement to Left lower extremity transmetatarsal amputation site.  - During admission ortho was consulted for RIGHT knee effusion / pain. Found to have septic knee joint. Arthroscopic wash out occurred during this time DOS: 11/06/2024  DOS: 11/04/2024  WB Status: NWB to the LEFT lower extremity  ABX Rx: DC with ABX guided by dx for SA R knee / infectious disease colleagues.  Dr. Epifanio -  IV Daptomycin  and Meropenem   Planned duration 6 weeks, Stop Date: 12/19/2023  Dressings: Wound Vac - Please place adaptic (clear mesh) overlying the deep space structures as well as the incision sites. The incision sites are all bridged with black sponge to assist with closure. BEDSIDE NURSE; PLEASE REMOVE VAC DRESSING PRIOR TO DISCHARGE AND APPLY MOIST GAUZE AND KERLEX FOR TRANSFER AND REHAB FACILITY WILL APPLY THEIR BRAND OF VAC UPON ARRIVAL.  Compliance: Patient reports compliance with dressing changes that is being conducted at least 2 times a week for wound VAC changes.  Patient relates that he has been compliant with nonweightbearing instructions to the left lower extremity and has been able to flex his knee and extend it since knee arthroscopy done by Dr. Tobie on 11/06/2024. Patient does endorse that he has had episodes of diarrhea that is very watery and has felt very uncomfortable with this, soiling himself in his bed as he is found incontinence while he sleeping -this is a new finding and has never happened to him before.  He was told to collect a stool sample but is unclear where this goes or when he collects it excetra.  He has not been in touch with infectious disease department Dr. Epifanio or Dr. Grandville but but relates that he has an appointment on 12/02/2024. He has an appointment with Ortho next week and  [place].  Denies F/C/N/V/SOB/CP. No acute pedal complaints at this time.  PHYSICAL EXAMINATION:  BP 107/72   Ht 175.3 cm (5' 9.02)   Wt 99.8 kg (220 lb 0.3 oz)   BMI 32.48 kg/m   GEN: NAD. AOX3. ? RESP: Non-labored breathing on RA.? ABD: NT/ND of all four quadrants.? NEURO: Moving all four extremities spontaneously. ? ? FOCUSED LOWER EXTREMITY EXAMINATION:?  NEURO: ? - No gross motor deficits noted - No paresthesias elicited on examination. ?? - LOPS via SWMF examination bilaterally   VASCULAR: ? - DP/PT 2 - Capillary refill - Edema noted to surgical extremity as anticipated for acute postoperative state     MSK: ? -S/P R TMA -S/p L TMA with rotational closure - TTP - none noted on exam - No calf tenderness. ?   DERM: ? - Dressings with drainage noted/strikethrough on bandaging. - No proximal streaking.  There is malodor however I believes this might be secondary to hygiene no odor from wound base. -No expression of fluid, there is mild maceration greater than 1 cm to adjacent wound tissue.  There is mild undermining of the medial flap which after debridement appears to be granular wound base and healthy tissue. Wound size approximately 7 cm x 8 cm x 2 cm depth Probe to capsule No bone exposed in wound bed 90% granular tissue, 10% fibrotic tissue       ASSESSMENT AND PLAN POST-OPERATIVE VISIT:   DOS: 11/04/2024 #S/P I&D with wound vac placement of the LEFT lower extremity TMA  site; doing well and progressing as anticipated.   Number and complexity of problems addressed: Moderate risk Risk of morbidity\mortality from additional diagnostic testing or treatment: Moderate risk  Patient educated about continued conservative management in the post-opertaive period.  Patient has been advised of the approximate disability period involved for the procedure conducted. Below details some of the information specific to this case / current encounter that has been discussed  and reviewed by both the patient and the provider:   [No] Imaging Obtained/ Reviewed at this visit: N/A to obtain at follow-up visit or place an order for Ortho visit in [place] next week. [email or web address] ] Sutures removed - Date: if remaining, it will be 4 weeks from initial surgical date 10/17/2024  ----WOUND CARE-----  - Continue with wound VAC 2-3 times per week 125 mmHg continuous pressure ---WOUND CARE---  - Debridement conducted (as described in procedures / PE section) with removal of necrotic / fibrotic tissue including epidermis, dermis, and including/into subcutaenous tissue via sterile curette, sterile 15 blade. No incidents incurred. No complications. Patient tolerated procedure well. No need for cautery or additional controlled bleeding measures. *Dry dressing was reapplied with VAC disconnection, patient was advised to have VAC reapplied on return to skilled nursing facility.  Patient verbalizes understanding and instruction of wound VAC reapplication. - Educated on the importance of daily foot/ leg inspections (even if bandages left in place).  - Advised to keep incision site dressings c/d/i. While sutures in place, patient advised NOT to soak foot in water or to get wet. Dressings are to remain CLEAN/ DRY and not to be removed unless wound vac changes are being conducted. - Advised patient on clinical soi and recommended to seek medical assistance should those present.   ----WEIGHTBEARING ACTIVITY---- -  NWB on LLE   ----SWELLING----  - Advised to elevated - 'toes above nose' ; keeping foot/leg above heart level as much as possible in the first week to two weeks of surgery.  - Educated patient on edematous period that acutely can last months to a year post-operatively.*  - s/p suture removal advised to wear compression socks to assist in edema control Knee high. Starting with lowest grade setting 10-63mmHg and increasing as tolerated. To wear in day time and remove for sleep.   The patient  was given the opportunity to ask questions, which were answered to the best of my ability. The patient is to call if any further questions arise.  Update: I spoke with Dr. Epifanio who ordered a C. difficile sample kit from the lab for patient given recent GI distress with antibiotic usage.  CMA to call Mr. Stocks and advise on pick up 11/17/2024  RTC 2 weeks for wound VAC inspection/wound care

## 2024-11-18 ENCOUNTER — Telehealth: Payer: Self-pay

## 2024-11-18 NOTE — Telephone Encounter (Signed)
 Patient is currently at [facility] and complaining of a lot of diarrhea and nausea with the IV abx. Received a call also for [facility] with the SNF and she results they did a stool test on the patient which is positive for CDIFF. Results faxed to our office.  Per Dr. Epifanio he would like the patient to start start vancomycin  125 mg po QID for 14 days. Orders relayed to [facility] with the SNF.   [phone]

## 2024-11-18 NOTE — Telephone Encounter (Signed)
 Returned patient call - informed him he will be starting vancomycin  per Dr.Fitzgerald. orders have been given to SNF. Pt voiced his understanding.

## 2024-11-18 NOTE — Telephone Encounter (Signed)
 [name] requesting a call back at [phone].

## 2024-12-02 ENCOUNTER — Ambulatory Visit: Attending: Infectious Diseases | Admitting: Infectious Diseases

## 2024-12-02 DIAGNOSIS — E11628 Type 2 diabetes mellitus with other skin complications: Secondary | ICD-10-CM | POA: Diagnosis not present

## 2024-12-02 DIAGNOSIS — Z794 Long term (current) use of insulin: Secondary | ICD-10-CM | POA: Diagnosis not present

## 2024-12-02 DIAGNOSIS — D649 Anemia, unspecified: Secondary | ICD-10-CM | POA: Insufficient documentation

## 2024-12-02 DIAGNOSIS — M00861 Arthritis due to other bacteria, right knee: Secondary | ICD-10-CM

## 2024-12-02 DIAGNOSIS — Z89422 Acquired absence of other left toe(s): Secondary | ICD-10-CM | POA: Insufficient documentation

## 2024-12-02 DIAGNOSIS — Z8614 Personal history of Methicillin resistant Staphylococcus aureus infection: Secondary | ICD-10-CM | POA: Insufficient documentation

## 2024-12-02 DIAGNOSIS — Z87891 Personal history of nicotine dependence: Secondary | ICD-10-CM | POA: Diagnosis not present

## 2024-12-02 DIAGNOSIS — L089 Local infection of the skin and subcutaneous tissue, unspecified: Secondary | ICD-10-CM | POA: Diagnosis not present

## 2024-12-02 DIAGNOSIS — E1140 Type 2 diabetes mellitus with diabetic neuropathy, unspecified: Secondary | ICD-10-CM | POA: Insufficient documentation

## 2024-12-02 DIAGNOSIS — Z89421 Acquired absence of other right toe(s): Secondary | ICD-10-CM | POA: Diagnosis not present

## 2024-12-02 DIAGNOSIS — M009 Pyogenic arthritis, unspecified: Secondary | ICD-10-CM

## 2024-12-02 DIAGNOSIS — B9689 Other specified bacterial agents as the cause of diseases classified elsewhere: Secondary | ICD-10-CM

## 2024-12-02 NOTE — Progress Notes (Signed)
 NAME: [name]  DOB: 1966/01/24  MRN: [id number]  Date/Time: 12/02/2024 12:15 PM   Video visit Pt is in SNF [facility] Provider in the office People on the visit - provider and patient   58 yr male with h/o DM , neuropathy, b/l foot infection, MRSA bacteremia , RT TMA, LEFT TMA  Pt ifollow up visit after recent discharge from the hospital was in [facility] 12/1-12/6/25 for worsenign left TMA infection Underwent debridement on 11/06/24 and culture polymicrobial infection- Also had worsenign rt knee swellign and pain Aspiration of knee had 80K wbc Underwent arthroscopy and washout and synovectomy Cultre neg Was discharged to SNF on 6 weeks of IV meropenem  and dapto which he will finish on 12/18/24  Feeling better Wound vac left foot Seen podiatrist yesterday    Complicated history  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 by [name]  04/18/24 post op wound dehiscence - underwent debridement MRSA bacteremia  04/23/24 - underwent Rt TMA Treated with 2+ weeks Of Iv dapto/mero- was doing cocaine as OP and was transiently  in CT and missed apptsand then got PICC removed in [place]  06/05/24 dehiscence of wound and underwent delayed primary closure   06/26/24-07/14/24 for infected Rt TMA site and had MRSA bacteremia, underwent Revision TMA on 06/29/24 Polymicrobial infection from wound culture.  It was ESBL Klebsiella, Staph aureus, [facility],- sent to SNF on Iv daptomycin  and IV ertapenem  and completed 6 weeks until 08/06/24.     Left foot Chronic wounds left foot  --  Had bad infection and  osteomyelitis- debridement in June 2023- took 5 months of antibiotic Was told to get amputation, wanted to save his foot at all cost- so transferred care ot [facility] wound center- started hyperbaric oxygen - and got 9 and then stopped as he could not go there 5 times a week.  Completed cefadroxil , levaquin  and flagyl  X 4 months and prior to that nearly 8 weeks of IV Oral antibiotics stopped 10/31/22 The  wound looks really good and nice granulation tissue- was getting apligraf at the wound clinic He then moved to [place] in February 2024  to take care of his mother The wound flared up to on his left foot and he was hospitalized at [facility] in July 2024  and was released on vancomycin  and ceftriaxone  and then  p.o. linezolid  and p.o. Augmentin . He completed antibiotics sept/oct 2024 and was off any antibiotics The left foot wounds are not an issue  But they have not closed completley stable-wounds are better with no infections  and not an issue now Recent ACELL graft on 06/27/24 and wound stable  Following are pictures over 2 years June 1 , 2023   June 8.2023             07/04/24   09/27/24    HE underwent Left TMA on 10/17/24   Past Medical History:  Diagnosis Date   Amputation of right great toe    Cellulitis and abscess of foot    CKD stage 3a, GFR 45-59 ml/min (HCC)    Cocaine abuse (HCC)    + UDS on 05-12-24   DM (diabetes mellitus), type 2 (HCC)    ESBL (extended spectrum beta-lactamase) producing bacteria infection 04/18/2024   Hyperlipidemia    Hypertension    Methicillin resistant Staphylococcus aureus infection 07/14/2024   MRSA bacteremia    Normocytic anemia    Obesity    Tobacco abuse    Toe osteomyelitis, left (HCC)    Tuberculosis  tested positive,treated with medications.    Past Surgical History:  Procedure Laterality Date   AMPUTATION TOE Right 03/26/2024   Procedure: AMPUTATION, TOE;  Surgeon: Harden Jerona GAILS, MD;  Location: [facility] OR;  Service: Orthopedics;  Laterality: Right;  RIGHT GREAT TOE AMPUTATION   APPLICATION OF WOUND VAC Left 11/04/2024   Procedure: APPLICATION, WOUND VAC;  Surgeon: Tanda Greig MATSU, DPM;  Location: [facility] ORS;  Service: Podiatry;  Laterality: Left;  wound vac available intraop please.   BONE BIOPSY  04/18/2024   Procedure: BIOPSY, BONE (2ND METATARSAL);  Surgeon: Ashley Soulier, DPM;  Location: [facility] ORS;  Service:  Orthopedics/Podiatry;;   FOOT SURGERY Left    I & D EXTREMITY Left 05/05/2022   Procedure: LEFT FOOT DEBRIDEMENT;  Surgeon: Harden Jerona GAILS, MD;  Location: [facility] OR;  Service: Orthopedics;  Laterality: Left;   INCISION AND DRAINAGE OF WOUND Right 06/05/2024   Procedure: DELAYED PRIMARY CLOSURE OF RIGHT FOOT WOUND, EXCISION OF DISTAL METATARSAL 2, 3, 4, and 5;  Surgeon: Ashley Soulier, DPM;  Location: [facility] ORS;  Service: Orthopedics/Podiatry;  Laterality: Right;   INCISION AND DRAINAGE OF WOUND Left 11/04/2024   Procedure: IRRIGATION AND DEBRIDEMENT WOUND;  Surgeon: Tanda Greig MATSU, DPM;  Location: [facility] ORS;  Service: Podiatry;  Laterality: Left;   IRRIGATION AND DEBRIDEMENT FOOT Right 04/18/2024   Procedure: IRRIGATION AND DEBRIDEMENT FOOT, PLACEMENT OF ANTIBIOTIC BEADS;  Surgeon: Ashley Soulier, DPM;  Location: [facility] ORS;  Service: Orthopedics/Podiatry;  Laterality: Right;   IRRIGATION AND DEBRIDEMENT FOOT Left 06/29/2024   Procedure: IRRIGATION AND DEBRIDEMENT FOOT;  Surgeon: Lennie Barter, DPM;  Location: [facility] ORS;  Service: Orthopedics/Podiatry;  Laterality: Left;   KNEE ARTHROSCOPY Right 11/06/2024   Procedure: ARTHROSCOPY, KNEE;  Surgeon: Tobie Priest, MD;  Location: [facility] ORS;  Service: Orthopedics;  Laterality: Right;  ARTHROSCOPIC KNEE WASHOUT   MYRINGOTOMY WITH TUBE PLACEMENT Bilateral 07/20/2022   Procedure: MYRINGOTOMY WITH TUBE PLACEMENT;  Surgeon: Edda Mt, MD;  Location: [facility];  Service: ENT;  Laterality: Bilateral;  Diabetic   TEE WITHOUT CARDIOVERSION N/A 07/02/2024   Procedure: ECHOCARDIOGRAM, TRANSESOPHAGEAL;  Surgeon: Perla Evalene PARAS, MD;  Location: [facility] ORS;  Service: Cardiovascular;  Laterality: N/A;   TONSILLECTOMY     TRANSMETATARSAL AMPUTATION Right 04/23/2024   Procedure: AMPUTATION, FOOT, TRANSMETATARSAL;  Surgeon: Ashley Soulier, DPM;  Location: [facility] ORS;  Service: Orthopedics/Podiatry;  Laterality: Right;   TRANSMETATARSAL AMPUTATION Right 06/29/2024    Procedure: AMPUTATION, FOOT, TRANSMETATARSAL;  Surgeon: Lennie Barter, DPM;  Location: [facility] ORS;  Service: Orthopedics/Podiatry;  Laterality: Right;  RIGHT TRANSMETATARSAL AMPUTATION REVISION   TRANSMETATARSAL AMPUTATION Left 10/17/2024   Procedure: AMPUTATION, FOOT, TRANSMETATARSAL;  Surgeon: Tanda Greig MATSU, DPM;  Location: [facility] ORS;  Service: Podiatry;  Laterality: Left;  pulse lavage available    Social History   Socioeconomic History   Marital status: Single    Spouse name: Not on file   Number of children: Not on file   Years of education: Not on file   Highest education level: Not on file  Occupational History   Not on file  Tobacco Use   Smoking status: Former    Current packs/day: 0.00    Average packs/day: 0.3 packs/day for 25.0 years (6.3 ttl pk-yrs)    Types: Cigarettes    Start date: 09/17/1997    Quit date: 09/17/2022    Years since quitting: 2.2   Smokeless tobacco: Never   Tobacco comments:    Quit  Vaping Use   Vaping status: Never Used  Substance  and Sexual Activity   Alcohol use: No   Drug use: No   Sexual activity: Yes  Other Topics Concern   Not on file  Social History Narrative   Not on file   Social Drivers of Health   Tobacco Use: Medium Risk (11/06/2024)   Patient History    Smoking Tobacco Use: Former    Smokeless Tobacco Use: Never    Passive Exposure: Not on file  Financial Resource Strain: Low Risk  (11/25/2024)   Received from [facility]   Overall Financial Resource Strain (CARDIA)    Difficulty of Paying Living Expenses: Not hard at all  Food Insecurity: No Food Insecurity (11/25/2024)   Received from [facility]   Epic    Within the past 12 months, you worried that your food would run out before you got the money to buy more.: Never true    Within the past 12 months, the food you bought just didn't last and you didn't have money to get more.: Never true  Transportation Needs: No Transportation  Needs (11/25/2024)   Received from [facility] - Transportation    In the past 12 months, has lack of transportation kept you from medical appointments or from getting medications?: No    Lack of Transportation (Non-Medical): No  Physical Activity: Not on file  Stress: Not on file  Social Connections: Moderately Integrated (11/03/2024)   Social Connection and Isolation Panel    Frequency of Communication with Friends and Family: More than three times a week    Frequency of Social Gatherings with Friends and Family: More than three times a week    Attends Religious Services: 1 to 4 times per year    Active Member of [employer] or Organizations: Yes    Attends Banker Meetings: 1 to 4 times per year    Marital Status: Never married  Intimate Partner Violence: Not At Risk (11/03/2024)   Epic    Fear of Current or Ex-Partner: No    Emotionally Abused: No    Physically Abused: No    Sexually Abused: No  Depression (PHQ2-9): Low Risk (08/28/2023)   Depression (PHQ2-9)    PHQ-2 Score: 0  Alcohol Screen: Not on file  Housing: Low Risk  (11/25/2024)   Received from [facility]   Epic    In the last 12 months, was there a time when you were not able to pay the mortgage or rent on time?: No    In the past 12 months, how many times have you moved where you were living?: 0    At any time in the past 12 months, were you homeless or living in a shelter (including now)?: No  Utilities: Not At Risk (11/25/2024)   Received from [facility]   Epic    In the past 12 months has the electric, gas, oil, or water company threatened to shut off services in your home?: No  Health Literacy: Adequate Health Literacy (05/01/2024)   B1300 Health Literacy    Frequency of need for help with medical instructions: Never    FH Sister, mother, grandmother - DM Brother- colon cancer  No Known Allergies   Current Outpatient Medications   Medication Sig Dispense Refill   bisacodyl  (DULCOLAX) 5 MG EC tablet Take 2 tablets (10 mg total) by mouth at bedtime as needed for moderate constipation. 30 tablet 0   Cholecalciferol  (VITAMIN D3) 1.25 MG (  50000 UT) CAPS Take 1 capsule (50,000 Units total) by mouth every Monday. 4 capsule 0   Continuous Blood Gluc Transmit (DEXCOM G6 TRANSMITTER) MISC Use to check blood sugar three times daily. Change transmitter once every 909 days. E11.69 1 each 1   daptomycin  (CUBICIN ) IVPB Inject 700 mg into the vein daily. Indication:  septic knee, TMA site infection Last Day of Therapy:  12/18/2024 Labs - Once weekly:  CBC/D, CMP, and CPK Labs - Once weekly: CRP FAX weekly labs to [phone]  Method of administration: IV Push Method of administration may be changed at the discretion of facility  Please remove PICC after antibiotic course is completed. 40 Units 0   diclofenac  Sodium (VOLTAREN ) 1 % GEL Apply 4 g topically 4 (four) times daily. 50 g 0   gabapentin  (NEURONTIN ) 300 MG capsule Take 1 capsule (300 mg total) by mouth 2 (two) times daily for neuropathy pain. 60 capsule 0   glucose blood (ACCU-CHEK GUIDE TEST) test strip Use to check blood sugar 3 times daily. 100 each 6   insulin  aspart (NOVOLOG ) 100 UNIT/ML FlexPen Inject 0-15 Units into the skin 3 (three) times daily before meals.  Correction coverage: Moderate (average weight, post-op) CBG 70 - 120: 0 units CBG 121 - 150: 2 units CBG 151 - 200: 3 units CBG 201 - 250: 5 units CBG 251 - 300: 8 units CBG 301 - 350: 11 units CBG 351 - 400: 15 units CBG > 400: call MD 15 mL 11   insulin  glargine-yfgn (SEMGLEE ) 100 UNIT/ML injection Inject 0.4 mLs (40 Units total) into the skin daily at 10 pm. 10 mL 11   Insulin  Pen Needle (TECHLITE PEN NEEDLES) 32G X 4 MM MISC Use as directed to inject insulin  up to 4 times daily. 100 each 0   losartan  (COZAAR ) 25 MG tablet Take 0.5 tablets (12.5 mg total) by mouth daily.     meropenem  (MERREM ) IVPB Inject  1 g into the vein every 8 (eight) hours. Indication:  septic knee, TMA site infection Last Day of Therapy:  12/18/2024 Labs - Once weekly:  CBC/D, CMP, and CPK Labs - Once weekly: CRP FAX weekly labs to [phone]  Method of administration: IV Push Method of administration may be changed at the discretion of facility  Please remove PICC after antibiotic course is completed. 120 Units 0   [Paused] metFORMIN  (GLUCOPHAGE -XR) 500 MG 24 hr tablet Take 2 tablets (1,000 mg total) by mouth 2 (two) times daily with a meal. 120 tablet 0   naloxone (NARCAN) nasal spray 4 mg/0.1 mL Place 0.4 mg into the nose once.     nicotine  (NICODERM CQ  - DOSED IN MG/24 HOURS) 21 mg/24hr patch Place 1 patch (21 mg total) onto the skin daily as needed (nicotine  craving). 28 patch 0   oxyCODONE -acetaminophen  (PERCOCET/ROXICET) 5-325 MG tablet Take 1-2 tablets by mouth every 4 (four) hours as needed for moderate pain (pain score 4-6) or severe pain (pain score 7-10). 5 tablet 0   polyethylene glycol (MIRALAX  / GLYCOLAX ) 17 g packet Mix 1 packet (17 g) according to package instructions and take by mouth 2 (two) times daily. 14 each 0   senna (SENOKOT) 8.6 MG TABS tablet Take 1 tablet (8.6 mg total) by mouth 2 (two) times daily. 120 tablet 0   No current facility-administered medications for this visit.     Abtx:  Anti-infectives (From admission, onward)    None       REVIEW OF SYSTEMS:  Const:  negative fever, negative chills, negative weight loss Eyes: negative diplopia or visual changes, negative eye pain ENT: negative coryza, negative sore throat Resp: negative cough, hemoptysis, dyspnea Cards: negative for chest pain, palpitations, lower extremity edema GU: negative for frequency, dysuria and hematuria GI: Negative for abdominal pain, diarrhea, bleeding, constipation Skin: negative for rash and pruritus Heme: negative for easy bruising and gum/nose bleeding MS:rt knee pain Neurolo:negative for headaches,  dizziness, vertigo, memory problems  Psych: anxiety, depression  Endocrine:  diabetes-  Allergy/Immunology- negative for any medication or food allergies ?  Objective:  VITALS:  There were no vitals taken for this visit.  PHYSICAL EXAM:  Pt looks well Cannot seen his wounds   Labs CK 58 (11/14/24) Cr 1.08 12/26 Hb 8.6 ? Impression/Recommendation  Diabetes mellitus with neuropathy   Rt knee septic arthritis- culture neg- on Dapto=Meropenem  IV for 6 weeks until 12/18/24 12/4 underwent debridement /washout by arthroscopy- Culture neg  Rt TMA-- healed completely  Left TMA site- wound- recent debridement on 12/2-  polymicrobial infection- enterobacter/morganella On IV meropenem  and IV dapto until 12/18/24  Anemia  H/o MRSA bacteremia on 7/24 - TEE neg 07/02/24 Completed 6 weeks of IV daptomycin .   H/o Cocaine use-  Recent Cdiff infection   Continue IV meropenem  and IV daptomycin  Follow up with podiatrist on 12/15/24 Will see him there  Discussed the management with the patient  Total time spent on his video visit- 20 min

## 2024-12-03 ENCOUNTER — Telehealth: Payer: Self-pay

## 2024-12-03 NOTE — Telephone Encounter (Signed)
 SABRA

## 2024-12-17 NOTE — Progress Notes (Cosign Needed Addendum)
 " DVT Clinic Note  Name: [name]     MRN: [id number]     DOB: 1966/07/09     Sex: male  PCP: [name], MD  Today's Visit: Visit Information: Initial Visit  Referred to DVT Clinic by: Emergency Department - Dr. Therisa KANDICE Silvan, MD Referred to CPP by: Dr. Lanis Reason for referral:  Chief Complaint  Patient presents with   DVT   HISTORY OF PRESENT ILLNESS: [name] is a 59 y.o. male who presents after diagnosis of DVT for medication management. Patient was diagnosed with an acute DVT of axillary vein of right upper extremity on 12/02/24. He reported to the [facility] with arm swelling. Patient recently had a PICC line in his right upper arm for IV antibiotics. Venous ultrasound of right extremity was ordered and completed with evidence of DVT in the right subclavian vein and right axillary vein, and superficial vein thrombosis in the proximal right cephalic vein on 87/69/74. Patient was started on Eliquis  starter pack upon discharge. Today, patient reports to clinic accompanied by his brother. Patient using a wheelchair due to foot infection. Patient states that his right PICC line had fallen out around mid December about 2 weeks before his DVT was diagnosed. At this time, the PICC line was relocated to the left arm for the remainder of treatment. Reports that left PICC line will be removed tonight at the rehab facility he is at after his final antibiotic doses. Endorses improvement in his symptoms regarding pain and swelling in his arm. He has been able to move his arm more freely. Patient is concerned about his blood pressure as it's been elevated since DVT diagnosis. Denies abnormal bleeding or bruising. Denies missed doses of Eliquis . Patient interested in scheduling PCP appointment.  Positive Thrombotic Risk Factors: Recent surgery (within 3 months), Recent trauma (within 3 months), Obesity Bleeding Risk Factors: Renal failure, Hypertension (uncontrolled)  Negative Thrombotic Risk  Factors: Previous VTE, Recent admission to hospital with acute illness (within 3 months), Paralysis, paresis, or recent plaster cast immobilization of lower extremity, Central venous catheterization, Bed rest >72 hours within 3 months, Sedentary journey lasting >8 hours within 4 weeks, Pregnancy, Within 6 weeks postpartum, Recent cesarean section (within 3 months), Estrogen therapy, Testosterone therapy, Erythropoiesis-stimulating agent, Recent COVID diagnosis (within 3 months), Active cancer, Non-malignant, chronic inflammatory condition, Known thrombophilic condition, Smoking, Older age  Rx Insurance Coverage: Medicaid Rx Affordability: Eliquis  $4 for 30 days, Eliquis  $4 for 90 days Rx Assistance Provided: Medicaid approved Preferred Pharmacy: Refills sent to [facility]  Past Medical History:  Diagnosis Date   Amputation of right great toe    Cellulitis and abscess of foot    CKD stage 3a, GFR 45-59 ml/min (HCC)    Cocaine abuse (HCC)    + UDS on 05-12-24   DM (diabetes mellitus), type 2 (HCC)    ESBL (extended spectrum beta-lactamase) producing bacteria infection 04/18/2024   Hyperlipidemia    Hypertension    Methicillin resistant Staphylococcus aureus infection 07/14/2024   MRSA bacteremia    Normocytic anemia    Obesity    Tobacco abuse    Toe osteomyelitis, left (HCC)    Tuberculosis    tested positive,treated with medications.    Past Surgical History:  Procedure Laterality Date   AMPUTATION TOE Right 03/26/2024   Procedure: AMPUTATION, TOE;  Surgeon: Harden Jerona GAILS, MD;  Location: [facility] OR;  Service: Orthopedics;  Laterality: Right;  RIGHT GREAT TOE AMPUTATION   APPLICATION OF  WOUND VAC Left 11/04/2024   Procedure: APPLICATION, WOUND VAC;  Surgeon: Tanda Greig MATSU, DPM;  Location: [facility] ORS;  Service: Podiatry;  Laterality: Left;  wound vac available intraop please.   BONE BIOPSY  04/18/2024   Procedure: BIOPSY, BONE (2ND METATARSAL);  Surgeon: Ashley Soulier,  DPM;  Location: [facility] ORS;  Service: Orthopedics/Podiatry;;   FOOT SURGERY Left    I & D EXTREMITY Left 05/05/2022   Procedure: LEFT FOOT DEBRIDEMENT;  Surgeon: Harden Jerona GAILS, MD;  Location: [facility] OR;  Service: Orthopedics;  Laterality: Left;   INCISION AND DRAINAGE OF WOUND Right 06/05/2024   Procedure: DELAYED PRIMARY CLOSURE OF RIGHT FOOT WOUND, EXCISION OF DISTAL METATARSAL 2, 3, 4, and 5;  Surgeon: Ashley Soulier, DPM;  Location: [facility] ORS;  Service: Orthopedics/Podiatry;  Laterality: Right;   INCISION AND DRAINAGE OF WOUND Left 11/04/2024   Procedure: IRRIGATION AND DEBRIDEMENT WOUND;  Surgeon: Tanda Greig MATSU, DPM;  Location: [facility] ORS;  Service: Podiatry;  Laterality: Left;   IRRIGATION AND DEBRIDEMENT FOOT Right 04/18/2024   Procedure: IRRIGATION AND DEBRIDEMENT FOOT, PLACEMENT OF ANTIBIOTIC BEADS;  Surgeon: Ashley Soulier, DPM;  Location: [facility] ORS;  Service: Orthopedics/Podiatry;  Laterality: Right;   IRRIGATION AND DEBRIDEMENT FOOT Left 06/29/2024   Procedure: IRRIGATION AND DEBRIDEMENT FOOT;  Surgeon: Lennie Barter, DPM;  Location: [facility] ORS;  Service: Orthopedics/Podiatry;  Laterality: Left;   KNEE ARTHROSCOPY Right 11/06/2024   Procedure: ARTHROSCOPY, KNEE;  Surgeon: Tobie Priest, MD;  Location: [facility] ORS;  Service: Orthopedics;  Laterality: Right;  ARTHROSCOPIC KNEE WASHOUT   MYRINGOTOMY WITH TUBE PLACEMENT Bilateral 07/20/2022   Procedure: MYRINGOTOMY WITH TUBE PLACEMENT;  Surgeon: Edda Mt, MD;  Location: [facility];  Service: ENT;  Laterality: Bilateral;  Diabetic   TEE WITHOUT CARDIOVERSION N/A 07/02/2024   Procedure: ECHOCARDIOGRAM, TRANSESOPHAGEAL;  Surgeon: Perla Evalene PARAS, MD;  Location: [facility] OR[facility];  Service: Cardiovascular;  Laterality: N/A;   TONSILLECTOMY     TRANSMETATARSAL AMPUTATION Right 04/23/2024   Procedure: AMPUTATION, FOOT, TRANSMETATARSAL;  Surgeon: Ashley Soulier, DPM;  Location: [facility];  Service: Orthopedics/Podiatry;  Laterality: Right;   TRANSMETATARSAL  AMPUTATION Right 06/29/2024   Procedure: AMPUTATION, FOOT, TRANSMETATARSAL;  Surgeon: Lennie Barter, DPM;  Location: [facility];  Service: Orthopedics/Podiatry;  Laterality: Right;  RIGHT TRANSMETATARSAL AMPUTATION REVISION   TRANSMETATARSAL AMPUTATION Left 10/17/2024   Procedure: AMPUTATION, FOOT, TRANSMETATARSAL;  Surgeon: Tanda Greig MATSU, DPM;  Location: [facility];  Service: Podiatry;  Laterality: Left;  pulse lavage available    Social History   Socioeconomic History   Marital status: Single    Spouse name: Not on file   Number of children: Not on file   Years of education: Not on file   Highest education level: Not on file  Occupational History   Not on file  Tobacco Use   Smoking status: Former    Current packs/day: 0.00    Average packs/day: 0.3 packs/day for 25.0 years (6.3 ttl pk-yrs)    Types: Cigarettes    Start date: 09/17/1997    Quit date: 09/17/2022    Years since quitting: 2.2   Smokeless tobacco: Never   Tobacco comments:    Quit  Vaping Use   Vaping status: Never Used  Substance and Sexual Activity   Alcohol use: No   Drug use: No   Sexual activity: Yes  Other Topics Concern   Not on file  Social History Narrative   Not on file   Social Drivers of Health   Tobacco Use: Medium Risk (12/18/2024)  Patient History    Smoking Tobacco Use: Former    Smokeless Tobacco Use: Never    Passive Exposure: Not on file  Financial Resource Strain: Low Risk  (11/25/2024)   Received from [facility]   Overall Financial Resource Strain (CARDIA)    Difficulty of Paying Living Expenses: Not hard at all  Food Insecurity: No Food Insecurity (11/25/2024)   Received from [facility]   Epic    Within the past 12 months, you worried that your food would run out before you got the money to buy more.: Never true    Within the past 12 months, the food you bought just didn't last and you didn't have money to get more.: Never true  Transportation  Needs: No Transportation Needs (11/25/2024)   Received from [facility] - Transportation    In the past 12 months, has lack of transportation kept you from medical appointments or from getting medications?: No    Lack of Transportation (Non-Medical): No  Physical Activity: Not on file  Stress: Not on file  Social Connections: Moderately Integrated (11/03/2024)   Social Connection and Isolation Panel    Frequency of Communication with Friends and Family: More than three times a week    Frequency of Social Gatherings with Friends and Family: More than three times a week    Attends Religious Services: 1 to 4 times per year    Active Member of [employer] or Organizations: Yes    Attends Banker Meetings: 1 to 4 times per year    Marital Status: Never married  Intimate Partner Violence: Not At Risk (12/02/2024)   Received from [facility]   HITS    Over the last 12 months how often did your partner physically hurt you?: Never    Over the last 12 months how often did your partner insult you or talk down to you?: Never    Over the last 12 months how often did your partner threaten you with physical harm?: Never    Over the last 12 months how often did your partner scream or curse at you?: Never  Depression (PHQ2-9): Low Risk (08/28/2023)   Depression (PHQ2-9)    PHQ-2 Score: 0  Alcohol Screen: Not on file  Housing: Low Risk  (11/25/2024)   Received from [facility]   Epic    In the last 12 months, was there a time when you were not able to pay the mortgage or rent on time?: No    In the past 12 months, how many times have you moved where you were living?: 0    At any time in the past 12 months, were you homeless or living in a shelter (including now)?: No  Utilities: Not At Risk (11/25/2024)   Received from [facility]   Epic    In the past 12 months has the electric, gas, oil, or water company threatened to shut off  services in your home?: No  Health Literacy: Adequate Health Literacy (05/01/2024)   B1300 Health Literacy    Frequency of need for help with medical instructions: Never    Family History  Problem Relation Age of Onset   Colon cancer Neg Hx    Colon polyps Neg Hx    Crohn's disease Neg Hx    Esophageal cancer Neg Hx    Rectal cancer Neg Hx    Stomach cancer Neg Hx  Ulcerative colitis Neg Hx     Allergies as of 12/18/2024   (No Known Allergies)    Medications Ordered Prior to Encounter[1] REVIEW OF SYSTEMS:  Review of Systems  Respiratory:  Negative for shortness of breath.   Cardiovascular:  Negative for chest pain.   PHYSICAL EXAMINATION:  Vitals:   12/18/24 1414  BP: (!) 187/109  Pulse: 81  SpO2: 97%   Physical Exam Vitals reviewed.  Pulmonary:     Effort: Pulmonary effort is normal.  Musculoskeletal:        General: No swelling or tenderness.   Villalta Score for Post-Thrombotic Syndrome: Pain: Absent Cramps: Absent Heaviness: Absent Paresthesia: Absent Pruritus: Absent Pretibial Edema: Absent Skin Induration: Absent Hyperpigmentation: Absent Redness: Absent Venous Ectasia: Absent Pain on calf compression: Absent Villalta Preliminary Score: 0 Is venous ulcer present?: No If venous ulcer is present and score is <15, then 15 points total are assigned: Absent Villalta Total Score: 0  LABS:  CBC     Component Value Date/Time   WBC 7.5 11/07/2024 0612   RBC 3.30 (L) 11/07/2024 0612   HGB 7.9 (L) 11/07/2024 0612   HCT 25.0 (L) 11/07/2024 0612   PLT 403 (H) 11/07/2024 0612   MCV 75.8 (L) 11/07/2024 0612   MCH 23.9 (L) 11/07/2024 0612   MCHC 31.6 11/07/2024 0612   RDW 13.9 11/07/2024 0612   LYMPHSABS 2.5 10/22/2024 0310   MONOABS 0.9 10/22/2024 0310   EOSABS 0.3 10/22/2024 0310   BASOSABS 0.0 10/22/2024 0310    Hepatic Function      Component Value Date/Time   PROT 8.1 10/15/2024 1306   ALBUMIN 3.0 (L) 10/15/2024 1306   AST <10 (L) 10/15/2024  1306   ALT <5 10/15/2024 1306   ALKPHOS 78 10/15/2024 1306   BILITOT 0.2 10/15/2024 1306   BILIDIR 0.3 (H) 09/26/2024 1548   IBILI 0.8 09/26/2024 1548    Renal Function   Lab Results  Component Value Date   CREATININE 1.29 (H) 11/07/2024   CREATININE 1.13 11/06/2024   CREATININE 1.35 (H) 11/05/2024    CrCl cannot be calculated (Patient's most recent lab result is older than the maximum 21 days allowed.).   VVS Vascular Lab Studies:  12/30 US  VENOUS UPPER EXTREMITY RIGHT  FINDINGS: Thrombus is present in the right subclavian vein, right axillary vein, and proximal right cephalic vein. The right brachial veins and right basilic vein are patent.  IMPRESSION: Deep venous thrombosis involving the right subclavian and axillary veins.  ASSESSMENT: Location of DVT: Right upper extremity Cause of DVT: provoked by a transient risk factor  Patient without prior history of DVT with recent diagnosis of acute DVT involving the right subclavian and axillary veins provoked by recent PICC line in right upper extremity. Patient was started on Eliquis  starter pack while in the ED. Right arm PICC line has been removed since mid December at which time the PICC line was relocated to the left arm. Left PICC will be removed tonight at completion of IV antibiotic course. Given the location of acute DVT in the upper extremity, discussed patient with Dr. Lanis. No role for vascular intervention. Will plan to anticoagulate for a total of 3 months starting from PICC line removal (today). No barriers to medication access. Eliquis  refills sent to [facility]. Extensively counseled patient on Eliquis  and all patient's questions were answered. Patient confirmed understanding of plan.   PLAN: -Continue apixaban  (Eliquis ) 5 mg twice daily. -Expected duration of therapy: 3 months. Therapy started  on 12/02/24. -Patient educated on purpose, proper use and potential adverse effects of apixaban   (Eliquis ). -Discussed importance of taking medication around the same time every day. -Advised patient of medications to avoid (NSAIDs, aspirin  doses >100 mg daily). -Educated that Tylenol  (acetaminophen ) is the preferred analgesic to lower the risk of bleeding. -Advised patient to alert all providers of anticoagulation therapy prior to starting a new medication or having a procedure. -Emphasized importance of monitoring for signs and symptoms of bleeding (abnormal bruising, prolonged bleeding, nose bleeds, bleeding from gums, discolored urine, black tarry stools). -Educated patient to present to the ED if emergent signs and symptoms of new thrombosis occur. -Provided patient with PCP phone number to schedule an appointment at their earliest convenience.  Follow up: with DVT clinic in 3 months for end of treatment visit  Jenkins Graces, PharmD PGY1 Pharmacy Resident  Lum Herald, PharmD, BCACP, CPP [facility] [phone]    [1]  Current Outpatient Medications on File Prior to Visit  Medication Sig Dispense Refill   bisacodyl  (DULCOLAX) 5 MG EC tablet Take 2 tablets (10 mg total) by mouth at bedtime as needed for moderate constipation. 30 tablet 0   celecoxib (CELEBREX) 200 MG capsule Take 200 mg by mouth daily.     Cholecalciferol  (VITAMIN D3) 1.25 MG (50000 UT) CAPS Take 1 capsule (50,000 Units total) by mouth every Monday. 4 capsule 0   diclofenac  Sodium (VOLTAREN ) 1 % GEL Apply 4 g topically 4 (four) times daily. 50 g 0   gabapentin  (NEURONTIN ) 300 MG capsule Take 1 capsule (300 mg total) by mouth 2 (two) times daily for neuropathy pain. 60 capsule 0   insulin  aspart (NOVOLOG ) 100 UNIT/ML FlexPen Inject 0-15 Units into the skin 3 (three) times daily before meals.  Correction coverage: Moderate (average weight, post-op) CBG 70 - 120: 0 units CBG 121 - 150: 2 units CBG 151 - 200: 3 units CBG 201 - 250: 5 units CBG 251 - 300: 8 units CBG  301 - 350: 11 units CBG 351 - 400: 15 units CBG > 400: call MD 15 mL 11   insulin  glargine-yfgn (SEMGLEE ) 100 UNIT/ML injection Inject 0.4 mLs (40 Units total) into the skin daily at 10 pm. 10 mL 11   [Paused] metFORMIN  (GLUCOPHAGE -XR) 500 MG 24 hr tablet Take 2 tablets (1,000 mg total) by mouth 2 (two) times daily with a meal. 120 tablet 0   oxyCODONE -acetaminophen  (PERCOCET/ROXICET) 5-325 MG tablet Take 1-2 tablets by mouth every 4 (four) hours as needed for moderate pain (pain score 4-6) or severe pain (pain score 7-10). 5 tablet 0   OXYCONTIN  10 MG 12 hr tablet Take 10 mg by mouth 2 (two) times daily.     daptomycin  (CUBICIN ) IVPB Inject 700 mg into the vein daily. Indication:  septic knee, TMA site infection Last Day of Therapy:  12/18/2024 Labs - Once weekly:  CBC/D, CMP, and CPK Labs - Once weekly: CRP FAX weekly labs to [phone]  Method of administration: IV Push Method of administration may be changed at the discretion of facility  Please remove PICC after antibiotic course is completed. 40 Units 0   glucose blood (ACCU-CHEK GUIDE TEST) test strip Use to check blood sugar 3 times daily. 100 each 6   Insulin  Pen Needle (TECHLITE PEN NEEDLES) 32G X 4 MM MISC Use as directed to inject insulin  up to 4 times daily. 100 each 0   losartan  (COZAAR ) 25 MG tablet Take 0.5 tablets (  12.5 mg total) by mouth daily. (Patient not taking: Reported on 12/18/2024)     meropenem  (MERREM ) IVPB Inject 1 g into the vein every 8 (eight) hours. Indication:  septic knee, TMA site infection Last Day of Therapy:  12/18/2024 Labs - Once weekly:  CBC/D, CMP, and CPK Labs - Once weekly: CRP FAX weekly labs to [phone]  Method of administration: IV Push Method of administration may be changed at the discretion of facility  Please remove PICC after antibiotic course is completed. 120 Units 0   naloxone (NARCAN) nasal spray 4 mg/0.1 mL Place 0.4 mg into the nose once.     nicotine  (NICODERM CQ  - DOSED IN  MG/24 HOURS) 21 mg/24hr patch Place 1 patch (21 mg total) onto the skin daily as needed (nicotine  craving). 28 patch 0   No current facility-administered medications on file prior to visit.   "

## 2024-12-18 ENCOUNTER — Other Ambulatory Visit: Payer: Self-pay

## 2024-12-18 ENCOUNTER — Ambulatory Visit: Attending: Vascular Surgery | Admitting: Student-PharmD

## 2024-12-18 ENCOUNTER — Encounter: Payer: Self-pay | Admitting: Student-PharmD

## 2024-12-18 VITALS — BP 187/109 | HR 81

## 2024-12-18 DIAGNOSIS — I82A11 Acute embolism and thrombosis of right axillary vein: Secondary | ICD-10-CM | POA: Insufficient documentation

## 2024-12-18 MED ORDER — APIXABAN 5 MG PO TABS
5.0000 mg | ORAL_TABLET | Freq: Two times a day (BID) | ORAL | 0 refills | Status: AC
  Filled 2024-12-18: qty 180, 90d supply, fill #0

## 2024-12-18 NOTE — Patient Instructions (Addendum)
-  Continue Eliquis  5 mg twice daily for 3 months. -Your refills have been sent to [facility].   Call [facility] to get back in with a provider at Dr. Millard office: [phone]  -It is important to take your medication around the same time every day.  -Avoid NSAIDs like ibuprofen  (Advil , Motrin ) and naproxen  (Aleve ) as well as aspirin  doses over 100 mg daily. -Tylenol  (acetaminophen ) is the preferred over the counter pain medication to lower the risk of bleeding. -Be sure to alert all of your health care providers that you are taking an anticoagulant prior to starting a new medication or having a procedure. -Monitor for signs and symptoms of bleeding (abnormal bruising, prolonged bleeding, nose bleeds, bleeding from gums, discolored urine, black tarry stools). If you have fallen and hit your head OR if your bleeding is severe or not stopping, seek emergency care.  -Go to the emergency room if emergent signs and symptoms of new clot occur (new or worse swelling and pain in an arm or leg, shortness of breath, chest pain, fast or irregular heartbeats, lightheadedness, dizziness, fainting, coughing up blood) or if you experience a significant color change (pale or blue) in the extremity that has the DVT.  -We recommend you wear compression stockings (20-30 mmHg) as long as you are having swelling or pain. Be sure to purchase the correct size and take them off at night.   If you have any questions or need to reschedule an appointment, please call [phone]. If you are having an emergency, call 911 or present to the nearest emergency room.   What is a DVT?  -Deep vein thrombosis (DVT) is a condition in which a blood clot forms in a vein of the deep venous system which can occur in the lower leg, thigh, pelvis, arm, or neck. This condition is serious and can be life-threatening if the clot travels to the arteries of the lungs and causing a blockage (pulmonary embolism,  PE). A DVT can also damage veins in the leg, which can lead to long-term venous disease, leg pain, swelling, discoloration, and ulcers or sores (post-thrombotic syndrome).  -Treatment may include taking an anticoagulant medication to prevent more clots from forming and the current clot from growing, wearing compression stockings, and/or surgical procedures to remove or dissolve the clot.

## 2024-12-22 ENCOUNTER — Other Ambulatory Visit: Payer: Self-pay

## 2024-12-26 ENCOUNTER — Other Ambulatory Visit: Payer: Self-pay | Admitting: Family Medicine

## 2024-12-26 NOTE — Telephone Encounter (Signed)
 Patient has been called and given an earlier appointment for medication refills.

## 2024-12-26 NOTE — Telephone Encounter (Signed)
 Requested medications are due for refill today.  unsure  Requested medications are on the active medications list.  yes  Last refill. Novolog  04/29/2024 15mL 11 rf, Metformin  08/05/2024 #120  Future visit scheduled.   yes  Notes to clinic.  Novolog  signed by another provider. Metformin  was paused - please review for re-starting medication.    Requested Prescriptions  Pending Prescriptions Disp Refills   insulin  aspart (NOVOLOG ) 100 UNIT/ML FlexPen 15 mL 11    Sig: Inject 0-15 Units into the skin 3 (three) times daily before meals.  Correction coverage: Moderate (average weight, post-op) CBG 70 - 120: 0 units CBG 121 - 150: 2 units CBG 151 - 200: 3 units CBG 201 - 250: 5 units CBG 251 - 300: 8 units CBG 301 - 350: 11 units CBG 351 - 400: 15 units CBG > 400: call MD     Endocrinology:  Diabetes - Insulins Failed - 12/26/2024 12:11 PM      Failed - Valid encounter within last 6 months    Recent Outpatient Visits           2 years ago Type 2 diabetes mellitus with other specified complication, with long-term current use of insulin  [facility])   [facility] [place]. [facility] [name], MD   2 years ago Type 2 diabetes mellitus with other specified complication, with long-term current use of insulin  [facility])   [facility] [place]. [facility] Fleeta Morris, [place]ley L, RPH-CPP   2 years ago Type 2 diabetes mellitus with other specified complication, with long-term current use of insulin  [facility])   [facility] [place]. [facility] [name], MD   2 years ago Type 2 diabetes mellitus with other specified complication, with long-term current use of insulin  [facility])   [facility] [place]. [facility] [place], [place], [place]   2 years ago Type 2 diabetes mellitus with other specified complication, with long-term current use  of insulin  [facility])   [facility] [place]. [facility] [place], Clam, MD              Passed - HBA1C is between 0 and 7.9 and within 180 days    HbA1c, POC (controlled diabetic range)  Date Value Ref Range Status  12/12/2022 10.3 (A) 0.0 - 7.0 % Final   Hgb A1c MFr Bld  Date Value Ref Range Status  09/26/2024 6.7 (H) 4.8 - 5.6 % Final    Comment:    (NOTE) Diagnosis of Diabetes The following HbA1c ranges recommended by the American Diabetes Association (ADA) may be used as an aid in the diagnosis of diabetes mellitus.  Hemoglobin             Suggested A1C NGSP%              Diagnosis  <5.7                   Non Diabetic  5.7-6.4                Pre-Diabetic  >6.4                   Diabetic  <7.0  Glycemic control for                       adults with diabetes.            metFORMIN  (GLUCOPHAGE -XR) 500 MG 24 hr tablet 120 tablet 0    Sig: Take 2 tablets (1,000 mg total) by mouth 2 (two) times daily with a meal.     Endocrinology:  Diabetes - Biguanides Failed - 12/26/2024 12:11 PM      Failed - Cr in normal range and within 360 days    Creat  Date Value Ref Range Status  05/12/2024 1.79 (H) 0.70 - 1.30 mg/dL Final   Creatinine, Ser  Date Value Ref Range Status  11/07/2024 1.29 (H) 0.61 - 1.24 mg/dL Final         Failed - Valid encounter within last 6 months    Recent Outpatient Visits           2 years ago Type 2 diabetes mellitus with other specified complication, with long-term current use of insulin  (HCC)   [facility] [place]. [facility] [name], MD   2 years ago Type 2 diabetes mellitus with other specified complication, with long-term current use of insulin  [facility])   [facility] [place]. [facility] Fleeta Morris, [place] L, RPH-CPP   2 years ago Type 2 diabetes mellitus with other specified complication,  with long-term current use of insulin  [facility])   [facility] [facility] [place]. [facility] [name], MD   2 years ago Type 2 diabetes mellitus with other specified complication, with long-term current use of insulin  [facility]   [facility] [facility] [place]. [facility] [place], [place], [place]   2 years ago Type 2 diabetes mellitus with other specified complication, with long-term current use of insulin  [facility])   [facility] [place]. [facility] [place], Clam, MD              Passed - HBA1C is between 0 and 7.9 and within 180 days    HbA1c, POC (controlled diabetic range)  Date Value Ref Range Status  12/12/2022 10.3 (A) 0.0 - 7.0 % Final   Hgb A1c MFr Bld  Date Value Ref Range Status  09/26/2024 6.7 (H) 4.8 - 5.6 % Final    Comment:    (NOTE) Diagnosis of Diabetes The following HbA1c ranges recommended by the American Diabetes Association (ADA) may be used as an aid in the diagnosis of diabetes mellitus.  Hemoglobin             Suggested A1C NGSP%              Diagnosis  <5.7                   Non Diabetic  5.7-6.4                Pre-Diabetic  >6.4                   Diabetic  <7.0                   Glycemic control for  adults with diabetes.           Passed - eGFR in normal range and within 360 days    GFR calc Af Amer  Date Value Ref Range Status  12/29/2019 >60 >60 mL/min Corrected   GFR, Estimated  Date Value Ref Range Status  11/07/2024 >60 >60 mL/min Final    Comment:    (NOTE) Calculated using the CKD-EPI Creatinine Equation (2021)    eGFR  Date Value Ref Range Status  05/12/2024 44 (L) > OR = 60 mL/min/1.68m2 Final   EGFR  Date Value Ref Range Status  07/23/2024 54.0  Final    Comment:    Abstracted by HIM         Passed - B12 Level in normal range and within 720 days    Vitamin B-12  Date Value Ref Range Status   10/16/2024 477 180 - 914 pg/mL Final    Comment:    (NOTE) This assay is not validated for testing neonatal or myeloproliferative syndrome specimens for Vitamin B12 levels. Performed at [facility], [address], [place], [place] [address]          Passed - CBC within normal limits and completed in the last 12 months    WBC  Date Value Ref Range Status  11/07/2024 7.5 4.0 - 10.5 K/uL Final   RBC  Date Value Ref Range Status  11/07/2024 3.30 (L) 4.22 - 5.81 MIL/uL Final   Hemoglobin  Date Value Ref Range Status  11/07/2024 7.9 (L) 13.0 - 17.0 g/dL Final    Comment:    Reticulocyte Hemoglobin testing may be clinically indicated, consider ordering this additional test [id number]    HCT  Date Value Ref Range Status  11/07/2024 25.0 (L) 39.0 - 52.0 % Final   MCHC  Date Value Ref Range Status  11/07/2024 31.6 30.0 - 36.0 g/dL Final   [facility]  Date Value Ref Range Status  11/07/2024 23.9 (L) 26.0 - 34.0 pg Final   MCV  Date Value Ref Range Status  11/07/2024 75.8 (L) 80.0 - 100.0 fL Final   No results found for: PLTCOUNTKUC, LABPLAT, POCPLA RDW  Date Value Ref Range Status  11/07/2024 13.9 11.5 - 15.5 % Final

## 2024-12-26 NOTE — Telephone Encounter (Signed)
 Copied from CRM #[id number]. Topic: Clinical - Medication Refill >> Dec 26, 2024  8:34 AM Montie POUR wrote: Medication:  insulin  aspart (NOVOLOG ) 100 UNIT/ML FlexPen  metFORMIN  (GLUCOPHAGE -XR) 500 MG 24 hr tablet  Has the patient contacted their pharmacy? Yes (Agent: If no, request that the patient contact the pharmacy for the refill. If patient does not wish to contact the pharmacy document the reason why and proceed with request.) (Agent: If yes, when and what did the pharmacy advise?) Pharmacy needs order to refill  This is the patient's preferred pharmacy:   [id number] - [place], [place] - [address] AT [facility] [address] [place] [address] Phone: [phone] Fax: [phone]  Is this the correct pharmacy for this prescription? Yes If no, delete pharmacy and type the correct one.   Has the prescription been filled recently? No  Is the patient out of the medication? Yes - He has been out for a few days.   Has the patient been seen for an appointment in the last year OR does the patient have an upcoming appointment? Yes - 02/16/25 at 9:30 AM  Can we respond through MyChart? No  Agent: Please be advised that Rx refills may take up to 3 business days. We ask that you follow-up with your pharmacy.

## 2024-12-31 ENCOUNTER — Telehealth: Payer: Self-pay | Admitting: Family Medicine

## 2024-12-31 NOTE — Telephone Encounter (Signed)
 Contacted pt mailbox not setup to leave a message to confirmed appt

## 2025-01-01 ENCOUNTER — Other Ambulatory Visit: Payer: Self-pay

## 2025-01-01 ENCOUNTER — Encounter: Payer: Self-pay | Admitting: Family Medicine

## 2025-01-01 ENCOUNTER — Ambulatory Visit: Payer: Self-pay | Attending: Family Medicine | Admitting: Family Medicine

## 2025-01-01 VITALS — BP 144/78 | HR 89 | Temp 97.9°F | Ht 69.0 in | Wt 266.4 lb

## 2025-01-01 DIAGNOSIS — E1169 Type 2 diabetes mellitus with other specified complication: Secondary | ICD-10-CM | POA: Insufficient documentation

## 2025-01-01 DIAGNOSIS — Z1211 Encounter for screening for malignant neoplasm of colon: Secondary | ICD-10-CM

## 2025-01-01 DIAGNOSIS — G548 Other nerve root and plexus disorders: Secondary | ICD-10-CM | POA: Diagnosis not present

## 2025-01-01 DIAGNOSIS — G546 Phantom limb syndrome with pain: Secondary | ICD-10-CM | POA: Diagnosis not present

## 2025-01-01 DIAGNOSIS — E1159 Type 2 diabetes mellitus with other circulatory complications: Secondary | ICD-10-CM | POA: Insufficient documentation

## 2025-01-01 DIAGNOSIS — Z7984 Long term (current) use of oral hypoglycemic drugs: Secondary | ICD-10-CM | POA: Insufficient documentation

## 2025-01-01 DIAGNOSIS — Z7901 Long term (current) use of anticoagulants: Secondary | ICD-10-CM | POA: Diagnosis not present

## 2025-01-01 DIAGNOSIS — R52 Pain, unspecified: Secondary | ICD-10-CM | POA: Diagnosis not present

## 2025-01-01 DIAGNOSIS — Z79899 Other long term (current) drug therapy: Secondary | ICD-10-CM | POA: Diagnosis not present

## 2025-01-01 DIAGNOSIS — I82621 Acute embolism and thrombosis of deep veins of right upper extremity: Secondary | ICD-10-CM | POA: Insufficient documentation

## 2025-01-01 DIAGNOSIS — Z794 Long term (current) use of insulin: Secondary | ICD-10-CM | POA: Insufficient documentation

## 2025-01-01 DIAGNOSIS — I152 Hypertension secondary to endocrine disorders: Secondary | ICD-10-CM | POA: Diagnosis not present

## 2025-01-01 DIAGNOSIS — Z89432 Acquired absence of left foot: Secondary | ICD-10-CM | POA: Diagnosis not present

## 2025-01-01 LAB — POCT GLYCOSYLATED HEMOGLOBIN (HGB A1C): HbA1c, POC (controlled diabetic range): 7 % (ref 0.0–7.0)

## 2025-01-01 MED ORDER — LOSARTAN POTASSIUM 25 MG PO TABS
12.5000 mg | ORAL_TABLET | Freq: Every day | ORAL | 1 refills | Status: AC
  Filled 2025-01-01 (×2): qty 45, 90d supply, fill #0

## 2025-01-01 MED ORDER — METFORMIN HCL ER 500 MG PO TB24
500.0000 mg | ORAL_TABLET | Freq: Two times a day (BID) | ORAL | 1 refills | Status: AC
  Filled 2025-01-01: qty 180, 90d supply, fill #0

## 2025-01-01 MED ORDER — ATORVASTATIN CALCIUM 20 MG PO TABS
20.0000 mg | ORAL_TABLET | Freq: Every day | ORAL | 1 refills | Status: AC
  Filled 2025-01-01: qty 90, 90d supply, fill #0

## 2025-01-01 MED ORDER — INSULIN ASPART 100 UNIT/ML FLEXPEN
0.0000 [IU] | PEN_INJECTOR | Freq: Three times a day (TID) | SUBCUTANEOUS | 11 refills | Status: AC
  Filled 2025-01-01: qty 15, 42d supply, fill #0

## 2025-01-01 MED ORDER — INSULIN GLARGINE-YFGN 100 UNIT/ML ~~LOC~~ SOLN
20.0000 [IU] | Freq: Every day | SUBCUTANEOUS | 6 refills | Status: AC
  Filled 2025-01-01: qty 10, 28d supply, fill #0

## 2025-01-01 MED ORDER — DULOXETINE HCL 60 MG PO CPEP
60.0000 mg | ORAL_CAPSULE | Freq: Every day | ORAL | 1 refills | Status: AC
  Filled 2025-01-01: qty 90, 90d supply, fill #0

## 2025-01-01 NOTE — Patient Instructions (Signed)
 VISIT SUMMARY:  [name], you had a follow-up appointment today to discuss your recent health issues, including a blood clot in your right arm, diabetes, hypertension, and your recent foot surgery. We reviewed your current medications and made some adjustments to better manage your conditions.  YOUR PLAN:  -ACUTE RIGHT UPPER EXTREMITY DEEP VEIN THROMBOSIS: You have a blood clot in your right arm, likely caused by the dislodgement of a PICC line. Continue taking your anticoagulation medication for three months to prevent further clotting.  -TYPE 2 DIABETES MELLITUS: Your diabetes is currently well-controlled with an A1c of 7. We are transitioning you from Novolog  to Lantus  for more stable blood sugar control. You should discontinue Novolog  and start taking Lantus  20 units daily. Continue taking Metformin  500 mg twice daily and monitor your blood glucose levels before meals. Use the sliding scale for Novolog  if needed based on your blood glucose levels. We will reassess your A1c and diabetes management in three months.  -HYPERTENSION: Your blood pressure has been elevated recently. Continue taking losartan  12.5 mg daily. We will reassess your blood pressure management in three months.  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 Continue following up with your surgeon to monitor your recovery.  -GENERAL HEALTH MAINTENANCE: You are due for a colonoscopy screening. We have referred you for this screening to ensure your overall health is monitored.  INSTRUCTIONS:  Please follow up in three months to reassess your A1c, diabetes management, and blood pressure. Continue your current medications as discussed and monitor your blood glucose levels before meals. Use the sliding scale for Novolog  if needed. Attend your colonoscopy screening as referred.

## 2025-01-01 NOTE — Progress Notes (Signed)
 "  Subjective:  Patient ID: [name], male    DOB: 05/10/1966  Age: 59 y.o. MRN: [id number]  CC: Medical Management of Chronic Issues (Referral to pain management./Discuss BP medication)     Discussed the use of AI scribe software for clinical note transcription with the patient, who gave verbal consent to proceed.  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  About two weeks ago he was diagnosed with a right upper extremity DVT while in rehab after PICC line placement for antibiotic treatment of a left foot infection. The PICC had been moved from one arm to the other after it dislodged during sleep. He continues to follow-up with his surgeon after his left foot transmetatarsal amputation.  He endorses presence of phantom pain.  Last month he had right knee swelling and pain that required paracentesis.  He was given Celebrex for swelling, which he has stopped as the pain and swelling have resolved.  Since the blood clot diagnosis his blood pressure has been as high as 180/100 mmHg, though it has been around 120/80 mmHg at surgical follow-ups. He was on losartan  12.5 mg, which was doubled to 25 mg for high readings, but he has recently been taking only half of that increased dose (12.5mg ).  He manages diabetes with metformin  500 mg twice daily and Novolog  22 units twice daily and he does not administer Lantus  even though it appears on his med list. His blood sugars have been dropping rapidly with Novolog . His A1c is now 7, improved from 11.  He takes gabapentin  for muscle spasms and uses other pain medication about twice a week.    Past Medical History:  Diagnosis Date   Amputation of right great toe    Cellulitis and abscess of foot    CKD stage 3a, GFR 45-59 ml/min  (HCC)    Cocaine abuse (HCC)    + UDS on 05-12-24   DM (diabetes mellitus), type 2 (HCC)    ESBL (extended spectrum beta-lactamase) producing bacteria infection 04/18/2024   Hyperlipidemia    Hypertension    Methicillin resistant Staphylococcus aureus infection 07/14/2024   MRSA bacteremia    Normocytic anemia    Obesity    Tobacco abuse    Toe osteomyelitis, left (HCC)    Tuberculosis    tested positive,treated with medications.    Past Surgical History:  Procedure Laterality Date   AMPUTATION TOE Right 03/26/2024   Procedure: AMPUTATION, TOE;  Surgeon: Harden Jerona GAILS, MD;  Location: [facility] OR;  Service: Orthopedics;  Laterality: Right;  RIGHT GREAT TOE AMPUTATION   APPLICATION OF WOUND VAC Left 11/04/2024   Procedure: APPLICATION, WOUND VAC;  Surgeon: Tanda Greig MATSU, DPM;  Location: [facility] ORS;  Service: Podiatry;  Laterality: Left;  wound vac available intraop please.   BONE BIOPSY  04/18/2024   Procedure: BIOPSY, BONE (2ND METATARSAL);  Surgeon: Ashley Soulier, DPM;  Location: [facility] ORS;  Service: Orthopedics/Podiatry;;   FOOT SURGERY Left    I & D EXTREMITY Left 05/05/2022   Procedure: LEFT FOOT DEBRIDEMENT;  Surgeon: Harden Jerona GAILS, MD;  Location: [facility] OR;  Service: Orthopedics;  Laterality: Left;   INCISION AND DRAINAGE OF WOUND Right 06/05/2024   Procedure: DELAYED PRIMARY CLOSURE OF RIGHT FOOT WOUND, EXCISION OF DISTAL METATARSAL 2, 3, 4, and  5;  Surgeon: Ashley Soulier, DPM;  Location: [facility] ORS;  Service: Orthopedics/Podiatry;  Laterality: Right;   INCISION AND DRAINAGE OF WOUND Left 11/04/2024   Procedure: IRRIGATION AND DEBRIDEMENT WOUND;  Surgeon: Tanda Greig MATSU, DPM;  Location: [facility] ORS;  Service: Podiatry;  Laterality: Left;   IRRIGATION AND DEBRIDEMENT FOOT Right 04/18/2024   Procedure: IRRIGATION AND DEBRIDEMENT FOOT, PLACEMENT OF ANTIBIOTIC BEADS;  Surgeon: Ashley Soulier, DPM;  Location: [facility] ORS;  Service: Orthopedics/Podiatry;  Laterality: Right;   IRRIGATION AND DEBRIDEMENT FOOT  Left 06/29/2024   Procedure: IRRIGATION AND DEBRIDEMENT FOOT;  Surgeon: Lennie Barter, DPM;  Location: [facility] ORS;  Service: Orthopedics/Podiatry;  Laterality: Left;   KNEE ARTHROSCOPY Right 11/06/2024   Procedure: ARTHROSCOPY, KNEE;  Surgeon: Tobie Priest, MD;  Location: [facility] ORS;  Service: Orthopedics;  Laterality: Right;  ARTHROSCOPIC KNEE WASHOUT   MYRINGOTOMY WITH TUBE PLACEMENT Bilateral 07/20/2022   Procedure: MYRINGOTOMY WITH TUBE PLACEMENT;  Surgeon: Edda Mt, MD;  Location: [facility];  Service: ENT;  Laterality: Bilateral;  Diabetic   TEE WITHOUT CARDIOVERSION N/A 07/02/2024   Procedure: ECHOCARDIOGRAM, TRANSESOPHAGEAL;  Surgeon: Perla Evalene PARAS, MD;  Location: [facility] OR[facility];  Service: Cardiovascular;  Laterality: N/A;   TONSILLECTOMY     TRANSMETATARSAL AMPUTATION Right 04/23/2024   Procedure: AMPUTATION, FOOT, TRANSMETATARSAL;  Surgeon: Ashley Soulier, DPM;  Location: [facility] ORS;  Service: Orthopedics/Podiatry;  Laterality: Right;   TRANSMETATARSAL AMPUTATION Right 06/29/2024   Procedure: AMPUTATION, FOOT, TRANSMETATARSAL;  Surgeon: Lennie Barter, DPM;  Location: [facility] ORS;  Service: Orthopedics/Podiatry;  Laterality: Right;  RIGHT TRANSMETATARSAL AMPUTATION REVISION   TRANSMETATARSAL AMPUTATION Left 10/17/2024   Procedure: AMPUTATION, FOOT, TRANSMETATARSAL;  Surgeon: Tanda Greig MATSU, DPM;  Location: [facility] OR[facility];  Service: Podiatry;  Laterality: Left;  pulse lavage available    Family History  Problem Relation Age of Onset   Colon cancer Neg Hx    Colon polyps Neg Hx    Crohn's disease Neg Hx    Esophageal cancer Neg Hx    Rectal cancer Neg Hx    Stomach cancer Neg Hx    Ulcerative colitis Neg Hx     Social History   Socioeconomic History   Marital status: Single    Spouse name: Not on file   Number of children: Not on file   Years of education: Not on file   Highest education level: Not on file  Occupational History   Not on file  Tobacco Use   Smoking status: Former     Current packs/day: 0.00    Average packs/day: 0.3 packs/day for 25.0 years (6.3 ttl pk-yrs)    Types: Cigarettes    Start date: 09/17/1997    Quit date: 09/17/2022    Years since quitting: 2.2   Smokeless tobacco: Never   Tobacco comments:    Quit  Vaping Use   Vaping status: Never Used  Substance and Sexual Activity   Alcohol use: No   Drug use: No   Sexual activity: Yes  Other Topics Concern   Not on file  Social History Narrative   Not on file   Social Drivers of Health   Tobacco Use: Medium Risk (01/02/2025)   Patient History    Smoking Tobacco Use: Former    Smokeless Tobacco Use: Never    Passive Exposure: Not on file  Financial Resource Strain: Low Risk  (11/25/2024)   Received from [facility]   Overall Financial Resource Strain (CARDIA)    Difficulty of Paying Living Expenses: Not hard at all  Food Insecurity: No Food Insecurity (11/25/2024)   Received from [facility]   Epic    Within the past 12 months, you worried that your food would run out before you got the money to buy more.: Never true    Within the past 12 months, the food you bought just didn't last and you didn't have money to get more.: Never true  Transportation Needs: No Transportation Needs (11/25/2024)   Received from [facility] - Transportation    In the past 12 months, has lack of transportation kept you from medical appointments or from getting medications?: No    Lack of Transportation (Non-Medical): No  Physical Activity: Not on file  Stress: Not on file  Social Connections: Moderately Integrated (11/03/2024)   Social Connection and Isolation Panel    Frequency of Communication with Friends and Family: More than three times a week    Frequency of Social Gatherings with Friends and Family: More than three times a week    Attends Religious Services: 1 to 4 times per year    Active Member of [employer] or Organizations: Yes     Attends Banker Meetings: 1 to 4 times per year    Marital Status: Never married  Depression (PHQ2-9): Low Risk (01/01/2025)   Depression (PHQ2-9)    PHQ-2 Score: 2  Alcohol Screen: Not on file  Housing: Low Risk  (11/25/2024)   Received from [facility]   Epic    In the last 12 months, was there a time when you were not able to pay the mortgage or rent on time?: No    In the past 12 months, how many times have you moved where you were living?: 0    At any time in the past 12 months, were you homeless or living in a shelter (including now)?: No  Utilities: Not At Risk (11/25/2024)   Received from [facility]   Epic    In the past 12 months has the electric, gas, oil, or water company threatened to shut off services in your home?: No  Health Literacy: Adequate Health Literacy (05/01/2024)   B1300 Health Literacy    Frequency of need for help with medical instructions: Never    Allergies[1]  Outpatient Medications Prior to Visit  Medication Sig Dispense Refill   apixaban  (ELIQUIS ) 5 MG TABS tablet Take 1 tablet (5 mg total) by mouth 2 (two) times daily. Start taking after completion of starter pack. 180 tablet 0   bisacodyl  (DULCOLAX) 5 MG EC tablet Take 2 tablets (10 mg total) by mouth at bedtime as needed for moderate constipation. 30 tablet 0   Cholecalciferol  (VITAMIN D3) 1.25 MG (50000 UT) CAPS Take 1 capsule (50,000 Units total) by mouth every Monday. 4 capsule 0   diclofenac  Sodium (VOLTAREN ) 1 % GEL Apply 4 g topically 4 (four) times daily. 50 g 0   gabapentin  (NEURONTIN ) 300 MG capsule Take 1 capsule (300 mg total) by mouth 2 (two) times daily for neuropathy pain. 60 capsule 0   glucose blood (ACCU-CHEK GUIDE TEST) test strip Use to check blood sugar 3 times daily. 100 each 6   Insulin  Pen Needle (TECHLITE PEN NEEDLES) 32G X 4 MM MISC Use as directed to inject insulin  up to 4 times daily. 100 each 0   naloxone (NARCAN) nasal spray  4 mg/0.1 mL Place 0.4 mg into the nose once.     nicotine  (NICODERM CQ  -  DOSED IN MG/24 HOURS) 21 mg/24hr patch Place 1 patch (21 mg total) onto the skin daily as needed (nicotine  craving). 28 patch 0   oxyCODONE -acetaminophen  (PERCOCET/ROXICET) 5-325 MG tablet Take 1-2 tablets by mouth every 4 (four) hours as needed for moderate pain (pain score 4-6) or severe pain (pain score 7-10). 5 tablet 0   OXYCONTIN  10 MG 12 hr tablet Take 10 mg by mouth 2 (two) times daily.     insulin  aspart (NOVOLOG ) 100 UNIT/ML FlexPen Inject 0-15 Units into the skin 3 (three) times daily before meals.  Correction coverage: Moderate (average weight, post-op) CBG 70 - 120: 0 units CBG 121 - 150: 2 units CBG 151 - 200: 3 units CBG 201 - 250: 5 units CBG 251 - 300: 8 units CBG 301 - 350: 11 units CBG 351 - 400: 15 units CBG > 400: call MD 15 mL 11   insulin  glargine-yfgn (SEMGLEE ) 100 UNIT/ML injection Inject 0.4 mLs (40 Units total) into the skin daily at 10 pm. 10 mL 11   losartan  (COZAAR ) 25 MG tablet Take 0.5 tablets (12.5 mg total) by mouth daily.     metFORMIN  (GLUCOPHAGE -XR) 500 MG 24 hr tablet Take 2 tablets (1,000 mg total) by mouth 2 (two) times daily with a meal. (Patient not taking: Reported on 01/01/2025) 120 tablet 0   No facility-administered medications prior to visit.     ROS Review of Systems  Constitutional:  Negative for activity change and appetite change.  HENT:  Negative for sinus pressure and sore throat.   Respiratory:  Negative for chest tightness, shortness of breath and wheezing.   Cardiovascular:  Negative for chest pain and palpitations.  Gastrointestinal:  Negative for abdominal distention, abdominal pain and constipation.  Genitourinary: Negative.   Musculoskeletal: Negative.   Psychiatric/Behavioral:  Negative for behavioral problems and dysphoric mood.     Objective:  BP (!) 144/78   Pulse 89   Temp 97.9 F (36.6 C) (Oral)   Ht 5' 9 (1.753 m)   Wt 266 lb 6.4 oz (120.8  kg)   SpO2 100%   BMI 39.34 kg/m      01/01/2025   10:58 AM 01/01/2025   10:26 AM 12/18/2024    2:14 PM  BP/Weight  Systolic BP 144 158 187  Diastolic BP 78 90 109  Wt. (Lbs)  266.4   BMI  39.34 kg/m2       Physical Exam Constitutional:      Appearance: He is well-developed.  Cardiovascular:     Rate and Rhythm: Normal rate.     Heart sounds: Normal heart sounds. No murmur heard. Pulmonary:     Effort: Pulmonary effort is normal.     Breath sounds: Normal breath sounds. No wheezing or rales.  Chest:     Chest wall: No tenderness.  Abdominal:     General: Bowel sounds are normal. There is no distension.     Palpations: Abdomen is soft. There is no mass.     Tenderness: There is no abdominal tenderness.  Musculoskeletal:     Right lower leg: No edema.     Left lower leg: No edema.     Comments: Left foot in a boot.  Neurological:     Mental Status: He is alert and oriented to person, place, and time.  Psychiatric:        Mood and Affect: Mood normal.        Latest Ref Rng & Units 11/07/2024    6:12 AM  11/06/2024    5:24 AM 11/05/2024    5:46 AM  CMP  Glucose 70 - 99 mg/dL 660  861  850   BUN 6 - 20 mg/dL 25  20  21    Creatinine 0.61 - 1.24 mg/dL 8.70  8.86  8.64   Sodium 135 - 145 mmol/L 131  133  135   Potassium 3.5 - 5.1 mmol/L 4.9  4.6  4.6   Chloride 98 - 111 mmol/L 98  101  101   CO2 22 - 32 mmol/L 22  24  25    Calcium  8.9 - 10.3 mg/dL 8.5  8.5  8.4     Lipid Panel     Component Value Date/Time   CHOL 111 05/11/2024 2130   TRIG 106 05/11/2024 2130   HDL 28 (L) 05/11/2024 2130   CHOLHDL 4.0 05/11/2024 2130   VLDL 21 05/11/2024 2130   LDLCALC 62 05/11/2024 2130    CBC    Component Value Date/Time   WBC 7.5 11/07/2024 0612   RBC 3.30 (L) 11/07/2024 0612   HGB 7.9 (L) 11/07/2024 0612   HCT 25.0 (L) 11/07/2024 0612   PLT 403 (H) 11/07/2024 0612   MCV 75.8 (L) 11/07/2024 0612   MCH 23.9 (L) 11/07/2024 0612   MCHC 31.6 11/07/2024 0612   RDW  13.9 11/07/2024 0612   LYMPHSABS 2.5 10/22/2024 0310   MONOABS 0.9 10/22/2024 0310   EOSABS 0.3 10/22/2024 0310   BASOSABS 0.0 10/22/2024 0310    Lab Results  Component Value Date   HGBA1C 7.0 01/01/2025   Lab Results  Component Value Date   HGBA1C 7.0 01/01/2025   HGBA1C 6.7 (H) 09/26/2024   HGBA1C 8.7 (H) 07/09/2024        Assessment & Plan Acute right upper extremity deep vein thrombosis Diagnosed two weeks ago, likely due to PICC line dislodgement. On anticoagulation therapy. - Continue anticoagulation therapy for three months until 03/02/2025  Type 2 diabetes mellitus Recent A1c of 7 indicates good control. Transitioning from Novolog  to Lantus  for stable glucose control.  - Initiated Lantus  20 units daily. - Continue Metformin  500 mg twice daily. - Provided sliding scale for Novolog  use based on blood glucose levels. -For blood sugars 0-150 give 0 units of insulin , 151-200 give 2 units of insulin , 201-250 give 4 units, 251-300 give 6 units, 301-350 give 8 units, 351-400 give 10 units,> 400 give 12 units and call M.D. Discussed hypoglycemia protocol. - Monitor blood glucose levels before meals. - Scheduled follow-up in three months to reassess A1c and diabetes management.  Hypertension associated with type 2 diabetes mellitus Recent elevated blood pressure episode.  - Rechecked blood pressure which revealed some improvement but it still elevated - Continue losartan  12.5 mg daily. -Counseled on blood pressure goal of less than 130/80, low-sodium, DASH diet, medication compliance, 150 minutes of moderate intensity exercise per week. Discussed medication compliance, adverse effects. - Scheduled follow-up in three months to reassess blood pressure management.  Status post transmetatarsal amputation of left foot/phantom pain Status post transmetatarsal amputation in November. -Initiated duloxetine  - Continue follow-up with surgeon.  General health maintenance Due for  colonoscopy screening. Screening for colon cancer- Referred for colonoscopy screening.        Meds ordered this encounter  Medications   DULoxetine  (CYMBALTA ) 60 MG capsule    Sig: Take 1 capsule (60 mg total) by mouth daily. For phantom pain    Dispense:  90 capsule    Refill:  1   atorvastatin  (  LIPITOR) 20 MG tablet    Sig: Take 1 tablet (20 mg total) by mouth daily.    Dispense:  90 tablet    Refill:  1   metFORMIN  (GLUCOPHAGE -XR) 500 MG 24 hr tablet    Sig: Take 1 tablet (500 mg total) by mouth 2 (two) times daily with a meal.    Dispense:  180 tablet    Refill:  1   insulin  glargine-yfgn 100 UNIT/ML injection    Sig: Inject 0.2 mLs (20 Units total) into the skin daily at 10 pm.    Dispense:  30 mL    Refill:  6   insulin  aspart (NOVOLOG ) 100 UNIT/ML FlexPen    Sig: Inject 0-12 Units into the skin 3 (three) times daily before meals.    Dispense:  15 mL    Refill:  11    For blood sugars 0-150 give 0 units of insulin , 151-200 give 2 units of insulin , 201-250 give 4 units, 251-300 give 6 units, 301-350 give 8 units, 351-400 give 10 units,> 400 give 12 units and call M.D.   losartan  (COZAAR ) 25 MG tablet    Sig: Take 0.5 tablets (12.5 mg total) by mouth daily.    Dispense:  45 tablet    Refill:  1    Follow-up: Return in about 3 months (around 04/01/2025) for Chronic medical conditions.       Corrina Sabin, MD, FAAFP. [facility] [place], [place] [phone]   01/02/2025, 2:18 PM    [1] No Known Allergies  "

## 2025-01-02 ENCOUNTER — Encounter: Payer: Self-pay | Admitting: Family Medicine

## 2025-01-02 ENCOUNTER — Ambulatory Visit: Payer: Self-pay | Admitting: Family Medicine

## 2025-01-02 LAB — MICROALBUMIN / CREATININE URINE RATIO
Creatinine, Urine: 143.8 mg/dL
Microalb/Creat Ratio: 31 mg/g{creat} — ABNORMAL HIGH (ref 0–29)
Microalbumin, Urine: 43.9 ug/mL

## 2025-01-05 ENCOUNTER — Other Ambulatory Visit (HOSPITAL_COMMUNITY): Payer: Self-pay

## 2025-02-16 ENCOUNTER — Ambulatory Visit: Payer: Self-pay | Admitting: Family Medicine

## 2025-03-11 ENCOUNTER — Ambulatory Visit: Admitting: Student-PharmD

## 2025-04-01 ENCOUNTER — Ambulatory Visit: Payer: Self-pay | Admitting: Family Medicine
